# Patient Record
Sex: Female | Born: 1975 | Race: Black or African American | Hispanic: Yes | Marital: Married | Smoking: Former smoker
Health system: Southern US, Community
[De-identification: ages and names within clinical notes are randomized; demographics above are authoritative.]

## PROBLEM LIST (undated history)

## (undated) ENCOUNTER — Emergency Department (HOSPITAL_COMMUNITY): Discharge status: Against Medical Advice | Payer: Self-pay

## (undated) DIAGNOSIS — R269 Unspecified abnormalities of gait and mobility: Secondary | ICD-10-CM

## (undated) DIAGNOSIS — Z86010 Personal history of colonic polyps: Secondary | ICD-10-CM

## (undated) DIAGNOSIS — K760 Fatty (change of) liver, not elsewhere classified: Secondary | ICD-10-CM

## (undated) DIAGNOSIS — G4730 Sleep apnea, unspecified: Secondary | ICD-10-CM

## (undated) DIAGNOSIS — J45909 Unspecified asthma, uncomplicated: Secondary | ICD-10-CM

## (undated) DIAGNOSIS — T7840XA Allergy, unspecified, initial encounter: Secondary | ICD-10-CM

## (undated) DIAGNOSIS — M793 Panniculitis, unspecified: Secondary | ICD-10-CM

## (undated) DIAGNOSIS — R0602 Shortness of breath: Secondary | ICD-10-CM

## (undated) DIAGNOSIS — D649 Anemia, unspecified: Secondary | ICD-10-CM

## (undated) DIAGNOSIS — Z860101 Personal history of adenomatous and serrated colon polyps: Secondary | ICD-10-CM

## (undated) DIAGNOSIS — N939 Abnormal uterine and vaginal bleeding, unspecified: Secondary | ICD-10-CM

## (undated) DIAGNOSIS — E119 Type 2 diabetes mellitus without complications: Secondary | ICD-10-CM

## (undated) DIAGNOSIS — R21 Rash and other nonspecific skin eruption: Secondary | ICD-10-CM

## (undated) DIAGNOSIS — F333 Major depressive disorder, recurrent, severe with psychotic symptoms: Secondary | ICD-10-CM

## (undated) DIAGNOSIS — R0789 Other chest pain: Secondary | ICD-10-CM

## (undated) DIAGNOSIS — K259 Gastric ulcer, unspecified as acute or chronic, without hemorrhage or perforation: Secondary | ICD-10-CM

## (undated) DIAGNOSIS — Z8759 Personal history of other complications of pregnancy, childbirth and the puerperium: Secondary | ICD-10-CM

## (undated) DIAGNOSIS — F4001 Agoraphobia with panic disorder: Secondary | ICD-10-CM

## (undated) DIAGNOSIS — R109 Unspecified abdominal pain: Secondary | ICD-10-CM

## (undated) DIAGNOSIS — J029 Acute pharyngitis, unspecified: Secondary | ICD-10-CM

## (undated) DIAGNOSIS — R1031 Right lower quadrant pain: Secondary | ICD-10-CM

## (undated) DIAGNOSIS — J189 Pneumonia, unspecified organism: Secondary | ICD-10-CM

## (undated) DIAGNOSIS — G43809 Other migraine, not intractable, without status migrainosus: Secondary | ICD-10-CM

## (undated) DIAGNOSIS — F419 Anxiety disorder, unspecified: Secondary | ICD-10-CM

## (undated) DIAGNOSIS — M545 Low back pain, unspecified: Secondary | ICD-10-CM

## (undated) DIAGNOSIS — N764 Abscess of vulva: Secondary | ICD-10-CM

## (undated) DIAGNOSIS — G5793 Unspecified mononeuropathy of bilateral lower limbs: Secondary | ICD-10-CM

## (undated) DIAGNOSIS — N83201 Unspecified ovarian cyst, right side: Secondary | ICD-10-CM

## (undated) DIAGNOSIS — G4733 Obstructive sleep apnea (adult) (pediatric): Secondary | ICD-10-CM

## (undated) DIAGNOSIS — G8929 Other chronic pain: Secondary | ICD-10-CM

## (undated) DIAGNOSIS — Z8674 Personal history of sudden cardiac arrest: Secondary | ICD-10-CM

## (undated) DIAGNOSIS — E559 Vitamin D deficiency, unspecified: Secondary | ICD-10-CM

## (undated) DIAGNOSIS — W19XXXA Unspecified fall, initial encounter: Secondary | ICD-10-CM

## (undated) DIAGNOSIS — Z90710 Acquired absence of both cervix and uterus: Secondary | ICD-10-CM

## (undated) DIAGNOSIS — K219 Gastro-esophageal reflux disease without esophagitis: Secondary | ICD-10-CM

## (undated) DIAGNOSIS — R000 Tachycardia, unspecified: Secondary | ICD-10-CM

## (undated) DIAGNOSIS — F329 Major depressive disorder, single episode, unspecified: Secondary | ICD-10-CM

## (undated) DIAGNOSIS — F411 Generalized anxiety disorder: Secondary | ICD-10-CM

## (undated) DIAGNOSIS — R519 Headache, unspecified: Secondary | ICD-10-CM

## (undated) DIAGNOSIS — F17210 Nicotine dependence, cigarettes, uncomplicated: Secondary | ICD-10-CM

## (undated) DIAGNOSIS — R102 Pelvic and perineal pain: Secondary | ICD-10-CM

## (undated) DIAGNOSIS — R29898 Other symptoms and signs involving the musculoskeletal system: Secondary | ICD-10-CM

## (undated) DIAGNOSIS — Z91018 Allergy to other foods: Secondary | ICD-10-CM

## (undated) DIAGNOSIS — K625 Hemorrhage of anus and rectum: Secondary | ICD-10-CM

## (undated) DIAGNOSIS — K529 Noninfective gastroenteritis and colitis, unspecified: Secondary | ICD-10-CM

## (undated) DIAGNOSIS — F4310 Post-traumatic stress disorder, unspecified: Secondary | ICD-10-CM

## (undated) DIAGNOSIS — M549 Dorsalgia, unspecified: Secondary | ICD-10-CM

## (undated) DIAGNOSIS — M2550 Pain in unspecified joint: Secondary | ICD-10-CM

## (undated) DIAGNOSIS — Z8659 Personal history of other mental and behavioral disorders: Secondary | ICD-10-CM

## (undated) DIAGNOSIS — G709 Myoneural disorder, unspecified: Secondary | ICD-10-CM

## (undated) DIAGNOSIS — K589 Irritable bowel syndrome without diarrhea: Secondary | ICD-10-CM

## (undated) DIAGNOSIS — R079 Chest pain, unspecified: Secondary | ICD-10-CM

## (undated) DIAGNOSIS — R1115 Cyclical vomiting syndrome unrelated to migraine: Secondary | ICD-10-CM

## (undated) DIAGNOSIS — R600 Localized edema: Secondary | ICD-10-CM

## (undated) DIAGNOSIS — E669 Obesity, unspecified: Secondary | ICD-10-CM

## (undated) DIAGNOSIS — D123 Benign neoplasm of transverse colon: Secondary | ICD-10-CM

## (undated) DIAGNOSIS — G4700 Insomnia, unspecified: Secondary | ICD-10-CM

## (undated) DIAGNOSIS — R2681 Unsteadiness on feet: Secondary | ICD-10-CM

## (undated) DIAGNOSIS — K6289 Other specified diseases of anus and rectum: Secondary | ICD-10-CM

## (undated) DIAGNOSIS — M48061 Spinal stenosis, lumbar region without neurogenic claudication: Secondary | ICD-10-CM

## (undated) DIAGNOSIS — F4010 Social phobia, unspecified: Secondary | ICD-10-CM

## (undated) DIAGNOSIS — D125 Benign neoplasm of sigmoid colon: Secondary | ICD-10-CM

## (undated) DIAGNOSIS — R51 Headache: Secondary | ICD-10-CM

## (undated) DIAGNOSIS — J209 Acute bronchitis, unspecified: Secondary | ICD-10-CM

## (undated) DIAGNOSIS — R0600 Dyspnea, unspecified: Secondary | ICD-10-CM

## (undated) DIAGNOSIS — L210 Seborrhea capitis: Secondary | ICD-10-CM

## (undated) DIAGNOSIS — Z789 Other specified health status: Secondary | ICD-10-CM

## (undated) DIAGNOSIS — E785 Hyperlipidemia, unspecified: Secondary | ICD-10-CM

## (undated) HISTORY — PX: OTHER SURGICAL HISTORY: SHX169

## (undated) HISTORY — PX: REFRACTIVE SURGERY: SHX103

## (undated) HISTORY — DX: Other chronic pain: G89.29

## (undated) HISTORY — DX: Right lower quadrant pain: R10.31

## (undated) HISTORY — DX: Benign neoplasm of sigmoid colon: D12.5

## (undated) HISTORY — DX: Allergy to other foods: Z91.018

## (undated) HISTORY — DX: Gastro-esophageal reflux disease without esophagitis: K21.9

## (undated) HISTORY — PX: ABDOMINAL HYSTERECTOMY: SHX81

## (undated) HISTORY — DX: Sleep apnea, unspecified: G47.30

## (undated) HISTORY — DX: Myoneural disorder, unspecified: G70.9

## (undated) HISTORY — DX: Headache, unspecified: R51.9

## (undated) HISTORY — DX: Headache: R51

## (undated) HISTORY — DX: Vitamin D deficiency, unspecified: E55.9

## (undated) HISTORY — DX: Shortness of breath: R06.02

## (undated) HISTORY — DX: Pain in unspecified joint: M25.50

## (undated) HISTORY — PX: ECTOPIC PREGNANCY SURGERY: SHX613

## (undated) HISTORY — PX: UPPER GASTROINTESTINAL ENDOSCOPY: SHX188

## (undated) HISTORY — DX: Gastric ulcer, unspecified as acute or chronic, without hemorrhage or perforation: K25.9

## (undated) HISTORY — DX: Acute bronchitis, unspecified: J20.9

## (undated) HISTORY — DX: Hyperlipidemia, unspecified: E78.5

## (undated) HISTORY — DX: Allergy, unspecified, initial encounter: T78.40XA

## (undated) HISTORY — DX: Benign neoplasm of transverse colon: D12.3

## (undated) HISTORY — DX: Localized edema: R60.0

## (undated) HISTORY — DX: Dorsalgia, unspecified: M54.9

## (undated) HISTORY — DX: Fatty (change of) liver, not elsewhere classified: K76.0

## (undated) HISTORY — PX: COLONOSCOPY: SHX174

---
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
## 2003-02-18 HISTORY — PX: LAPAROSCOPIC CHOLECYSTECTOMY: SUR755

## 2007-02-18 HISTORY — PX: UNILATERAL SALPINGECTOMY: SHX6160

## 2011-10-25 ENCOUNTER — Emergency Department (HOSPITAL_COMMUNITY)
Admission: EM | Admit: 2011-10-25 | Discharge: 2011-10-25 | Disposition: A | Discharge status: Home / Self Care | Payer: Self-pay | Attending: Emergency Medicine | Admitting: Emergency Medicine

## 2011-10-25 ENCOUNTER — Emergency Department (HOSPITAL_COMMUNITY): Payer: Self-pay

## 2011-10-25 ENCOUNTER — Encounter (HOSPITAL_COMMUNITY): Payer: Self-pay | Admitting: *Deleted

## 2011-10-25 DIAGNOSIS — R0602 Shortness of breath: Secondary | ICD-10-CM | POA: Insufficient documentation

## 2011-10-25 DIAGNOSIS — J029 Acute pharyngitis, unspecified: Secondary | ICD-10-CM | POA: Insufficient documentation

## 2011-10-25 DIAGNOSIS — F17200 Nicotine dependence, unspecified, uncomplicated: Secondary | ICD-10-CM | POA: Insufficient documentation

## 2011-10-25 DIAGNOSIS — R1310 Dysphagia, unspecified: Secondary | ICD-10-CM | POA: Insufficient documentation

## 2011-10-25 DIAGNOSIS — Z9089 Acquired absence of other organs: Secondary | ICD-10-CM | POA: Insufficient documentation

## 2011-10-25 DIAGNOSIS — J45909 Unspecified asthma, uncomplicated: Secondary | ICD-10-CM | POA: Insufficient documentation

## 2011-10-25 HISTORY — DX: Unspecified asthma, uncomplicated: J45.909

## 2011-10-25 LAB — CK TOTAL AND CKMB (NOT AT ARMC)
CK, MB: 0.8 ng/mL (ref 0.3–4.0)
Relative Index: INVALID (ref 0.0–2.5)
Total CK: 61 U/L (ref 7–177)

## 2011-10-25 LAB — CBC WITH DIFFERENTIAL/PLATELET
Basophils Absolute: 0 10*3/uL (ref 0.0–0.1)
Basophils Relative: 0 % (ref 0–1)
Eosinophils Absolute: 0 10*3/uL (ref 0.0–0.7)
Eosinophils Relative: 0 % (ref 0–5)
HCT: 33.5 % — ABNORMAL LOW (ref 36.0–46.0)
Hemoglobin: 10.7 g/dL — ABNORMAL LOW (ref 12.0–15.0)
Lymphocytes Relative: 12 % (ref 12–46)
Lymphs Abs: 1.3 10*3/uL (ref 0.7–4.0)
MCH: 25.8 pg — ABNORMAL LOW (ref 26.0–34.0)
MCHC: 31.9 g/dL (ref 30.0–36.0)
MCV: 80.9 fL (ref 78.0–100.0)
Monocytes Absolute: 0.7 10*3/uL (ref 0.1–1.0)
Monocytes Relative: 6 % (ref 3–12)
Neutro Abs: 9.1 10*3/uL — ABNORMAL HIGH (ref 1.7–7.7)
Neutrophils Relative %: 82 % — ABNORMAL HIGH (ref 43–77)
Platelets: 476 10*3/uL — ABNORMAL HIGH (ref 150–400)
RBC: 4.14 MIL/uL (ref 3.87–5.11)
RDW: 18.4 % — ABNORMAL HIGH (ref 11.5–15.5)
WBC: 11.2 10*3/uL — ABNORMAL HIGH (ref 4.0–10.5)

## 2011-10-25 LAB — URINALYSIS, ROUTINE W REFLEX MICROSCOPIC
Bilirubin Urine: NEGATIVE
Glucose, UA: NEGATIVE mg/dL
Hgb urine dipstick: NEGATIVE
Ketones, ur: NEGATIVE mg/dL
Nitrite: NEGATIVE
Protein, ur: NEGATIVE mg/dL
Specific Gravity, Urine: 1.002 — ABNORMAL LOW (ref 1.005–1.030)
Urobilinogen, UA: 0.2 mg/dL (ref 0.0–1.0)
pH: 8 (ref 5.0–8.0)

## 2011-10-25 LAB — URINE MICROSCOPIC-ADD ON

## 2011-10-25 LAB — BASIC METABOLIC PANEL
BUN: 5 mg/dL — ABNORMAL LOW (ref 6–23)
CO2: 20 mEq/L (ref 19–32)
Calcium: 9.2 mg/dL (ref 8.4–10.5)
Chloride: 99 mEq/L (ref 96–112)
Creatinine, Ser: 0.77 mg/dL (ref 0.50–1.10)
GFR calc Af Amer: >90 mL/min (ref >90)
GFR calc non Af Amer: >90 mL/min (ref >90)
Glucose, Bld: 105 mg/dL — ABNORMAL HIGH (ref 70–99)
Potassium: 3.5 mEq/L (ref 3.5–5.1)
Sodium: 136 mEq/L (ref 135–145)

## 2011-10-25 LAB — PREGNANCY, URINE: Preg Test, Ur: NEGATIVE

## 2011-10-25 LAB — D-DIMER, QUANTITATIVE (NOT AT ARMC): D-Dimer, Quant: 0.23 ug/mL-FEU (ref 0.00–0.48)

## 2011-10-25 LAB — RAPID STREP SCREEN (MED CTR MEBANE ONLY): Streptococcus, Group A Screen (Direct): NEGATIVE

## 2011-10-25 MED ORDER — LIDOCAINE VISCOUS 2 % MT SOLN: 15.0000 mL | OROMUCOSAL | Status: AC | PRN

## 2011-10-25 MED ORDER — ONDANSETRON HCL 4 MG/2ML IJ SOLN
4.0000 mg | Freq: Once | INTRAMUSCULAR | Status: AC
  Administered 2011-10-25: 4 mg via INTRAVENOUS
  Filled 2011-10-25: qty 2

## 2011-10-25 MED ORDER — CLINDAMYCIN HCL 150 MG PO CAPS: 300.0000 mg | ORAL_CAPSULE | Freq: Three times a day (TID) | ORAL | Status: AC

## 2011-10-25 MED ORDER — KETOROLAC TROMETHAMINE 30 MG/ML IJ SOLN
30.0000 mg | Freq: Once | INTRAMUSCULAR | Status: AC
  Administered 2011-10-25: 30 mg via INTRAVENOUS
  Filled 2011-10-25: qty 1

## 2011-10-25 MED ORDER — ALBUTEROL SULFATE (5 MG/ML) 0.5% IN NEBU
2.5000 mg | INHALATION_SOLUTION | RESPIRATORY_TRACT | Status: DC
  Administered 2011-10-25: 2.5 mg via RESPIRATORY_TRACT
  Filled 2011-10-25: qty 0.5

## 2011-10-25 MED ORDER — MORPHINE SULFATE 4 MG/ML IJ SOLN
4.0000 mg | Freq: Once | INTRAMUSCULAR | Status: AC
  Administered 2011-10-25: 4 mg via INTRAVENOUS
  Filled 2011-10-25: qty 1

## 2011-10-25 MED ORDER — DEXAMETHASONE SODIUM PHOSPHATE 10 MG/ML IJ SOLN
4.0000 mg | Freq: Once | INTRAMUSCULAR | Status: AC
  Administered 2011-10-25: 4 mg via INTRAVENOUS
  Filled 2011-10-25: qty 1

## 2011-10-25 MED ORDER — METHYLPREDNISOLONE SODIUM SUCC 125 MG IJ SOLR
125.0000 mg | Freq: Once | INTRAMUSCULAR | Status: DC
  Filled 2011-10-25: qty 2

## 2011-10-25 MED ORDER — ALBUTEROL SULFATE (5 MG/ML) 0.5% IN NEBU
INHALATION_SOLUTION | RESPIRATORY_TRACT | Status: AC
  Administered 2011-10-25: 12:00:00
  Filled 2011-10-25: qty 1

## 2011-10-25 MED ORDER — SODIUM CHLORIDE 0.9 % IV BOLUS (SEPSIS)
1000.0000 mL | Freq: Once | INTRAVENOUS | Status: AC
  Administered 2011-10-25: 1000 mL via INTRAVENOUS

## 2011-10-25 MED ORDER — ACETAMINOPHEN 325 MG PO TABS
650.0000 mg | ORAL_TABLET | Freq: Once | ORAL | Status: AC
  Administered 2011-10-25: 650 mg via ORAL
  Filled 2011-10-25: qty 2

## 2011-10-25 MED ORDER — TRAMADOL HCL 50 MG PO TABS: 50.0000 mg | ORAL_TABLET | Freq: Four times a day (QID) | ORAL | Status: AC | PRN

## 2011-10-25 NOTE — ED Notes (Signed)
Pt states she feels she cannot walk to the bus stop to ride the bus home. Pt requesting transport arrangement. Social work notified and will eval case.

## 2011-10-25 NOTE — ED Provider Notes (Signed)
History     CSN: [id number]  Arrival date & time 10/25/11  1158   First MD Initiated Contact with Patient 10/25/11 1215      Chief Complaint  Patient presents with  . Asthma    (Consider location/radiation/quality/duration/timing/severity/associated sxs/prior treatment) HPI  36 y.o. female appears acutely agitated complaining of diffuse myalgia, sore throat, fever, shortness of breath worsening over the course of 48 hours.   Past Medical History  Diagnosis Date  . Asthma     Past Surgical History  Procedure Date  . Cholecystectomy 2005  . Oophorectomy 2009    left    Family History  Problem Relation Age of Onset  . Hypertension Mother   . Diabetes Mother   . Cancer Father   . Hyperlipidemia Father   . Hypertension Father     History  Substance Use Topics  . Smoking status: Current Some Day Smoker -- 5 years    Types: Cigarettes  . Smokeless tobacco: Not on file  . Alcohol Use: 0.6 oz/week    1 Cans of beer per week     one beer daily    OB History    Grav Para Term Preterm Abortions TAB SAB Ect Mult Living   3 1   1  1          Review of Systems  Constitutional: Positive for fever.  HENT: Positive for sore throat.   Respiratory: Positive for shortness of breath. Negative for cough.   Cardiovascular: Negative for chest pain.  Gastrointestinal: Negative for nausea, vomiting, abdominal pain and diarrhea.  Musculoskeletal: Positive for myalgias.  All other systems reviewed and are negative.    Allergies  Penicillins and Shellfish allergy  Home Medications   Current Outpatient Rx  Name Route Sig Dispense Refill  . IBUPROFEN 200 MG PO TABS Oral Take 400 mg by mouth every 6 (six) hours as needed. For pain      BP 137/87  Pulse 115  Temp 98.1 F (36.7 C)  Resp 22  SpO2 100%  LMP 09/30/2011  Physical Exam  Nursing note and vitals reviewed. Constitutional: She is oriented to person, place, and time. She appears well-developed and  well-nourished. She appears distressed.  HENT:  Head: Normocephalic and atraumatic.  Right Ear: External ear normal.  Left Ear: External ear normal.  Nose: Nose normal.  Mouth/Throat: Oropharynx is clear and moist. No oropharyngeal exudate.       Mild 1+ tonsillar hypertrophy no exudate. Patient is controlling her own secretions without issue.  Eyes: Conjunctivae and EOM are normal. Pupils are equal, round, and reactive to light.  Neck: Normal range of motion.        Exquisitely tender anterior cervical lymphadenopathy.  Cardiovascular: Normal rate, regular rhythm and normal heart sounds.  Exam reveals no friction rub.   No murmur heard. Pulmonary/Chest: Effort normal and breath sounds normal. No stridor. No respiratory distress. She has no wheezes. She has no rales. She exhibits no tenderness.       No tachypnea, accessory muscle use, patient is speaking in full sentences and lying supine comfortably.  Abdominal: Soft. Bowel sounds are normal. She exhibits no distension and no mass. There is no tenderness. There is no rebound and no guarding.  Musculoskeletal: Normal range of motion.  Lymphadenopathy:    She has cervical adenopathy.  Neurological: She is alert and oriented to person, place, and time.  Skin: Skin is warm. She is not diaphoretic.  Psychiatric: She has a normal mood and  affect.    ED Course  Procedures (including critical care time)  Labs Reviewed  CBC WITH DIFFERENTIAL - Abnormal; Notable for the following:    WBC 11.2 (*)     Hemoglobin 10.7 (*)     HCT 33.5 (*)     MCH 25.8 (*)     RDW 18.4 (*)     Platelets 476 (*)     Neutrophils Relative 82 (*)     Neutro Abs 9.1 (*)     All other components within normal limits  BASIC METABOLIC PANEL - Abnormal; Notable for the following:    Glucose, Bld 105 (*)     BUN 5 (*)     All other components within normal limits  URINALYSIS, ROUTINE W REFLEX MICROSCOPIC - Abnormal; Notable for the following:    APPearance  CLOUDY (*)     Specific Gravity, Urine 1.002 (*)     Leukocytes, UA TRACE (*)     All other components within normal limits  URINE MICROSCOPIC-ADD ON - Abnormal; Notable for the following:    Squamous Epithelial / LPF FEW (*)     Bacteria, UA FEW (*)     All other components within normal limits  RAPID STREP SCREEN  D-DIMER, QUANTITATIVE  CK TOTAL AND CKMB  PREGNANCY, URINE  LAB REPORT - SCANNED   Dg Neck Soft Tissue  10/25/2011  *RADIOLOGY REPORT*  Clinical Data: Asthma. Difficulty swallowing.  NECK SOFT TISSUES - 1+ VIEW  Comparison: None.  Findings: The epiglottis is normal.  The aryepiglottic folds are normal.  The piriform sinus and vallecular air spaces are normal. No abnormal prevertebral or retropharyngeal soft tissue swelling or abnormal air collections.  The lung apices are clear.  Cervical ribs are noted.  IMPRESSION: Normal soft tissue views of the neck.   Original Report Authenticated By: P. Loralie Champagne, M.D.    Dg Chest 2 View  10/25/2011  *RADIOLOGY REPORT*  Clinical Data: Cough and shortness of breath.  CHEST - 2 VIEW  Comparison: None  Findings: The cardiac silhouette, mediastinal and hilar contours are normal.  Low lung volumes with mild vascular crowding and streaky basilar atelectasis.  There is peribronchial thickening and increased interstitial markings suggesting bronchitis.  No focal infiltrates or effusions.  The bony thorax is intact.  IMPRESSION: Findings suggest bronchitis.  No definite infiltrates.   Original Report Authenticated By: P. Loralie Champagne, M.D.      1. Pharyngitis       MDM  No signs of peritonsillar abscess. Patient received 4 mg of Decadron, with morphine and significant subjective improvement resulted. Patient's d-dimer is negative, chest x-ray and lateral soft tissue neck supple neck x-rays show no abnormalities. I will treat her for strep based on [facility] Criteria.   New Prescriptions   CLINDAMYCIN (CLEOCIN) 150 MG CAPSULE    Take 2  capsules (300 mg total) by mouth 3 (three) times daily.   LIDOCAINE (XYLOCAINE) 2 % SOLUTION    Take 15 mLs by mouth every 3 (three) hours as needed for pain.   TRAMADOL (ULTRAM) 50 MG TABLET    Take 1 tablet (50 mg total) by mouth every 6 (six) hours as needed for pain.          Wynetta Emery, PA-C 10/26/11 1008

## 2011-10-25 NOTE — ED Notes (Signed)
Pt wants to eat and drink. Cleared with ED MD.

## 2011-10-25 NOTE — ED Notes (Signed)
EDMD notified of pts recent temp. Orders given.

## 2011-10-25 NOTE — ED Notes (Signed)
MD at bedside. 

## 2011-10-25 NOTE — ED Notes (Signed)
CSW met with pt per request of RN. Pt informed CSW that she has no means of transportation upon discharge to return back to her residence in [place]. Pt's RN reported concerns of ambulation and inability to utilize public transit due to this issue. Pt has contacted other individuals in order to receive transportation assistance but has been unsuccessful. CSW informed pt that a taxi voucher will be provided for this occurrence to ensure pt has a safe discharge back to her residence. Pt thanked CSW and verbalized no other concerns. CSW signing off.  Janann Colonel., MSW, [facility] Clinical Social Worker [phone]

## 2011-10-25 NOTE — ED Notes (Addendum)
Per EMS: Pt c/o dyspnea, n/v/d with throat pain for two days. Unable to afford asthma meds for last few months due to financial issues.

## 2011-10-26 NOTE — ED Provider Notes (Signed)
Medical screening examination/treatment/procedure(s) were performed by non-physician practitioner and as supervising physician I was immediately available for consultation/collaboration.   Richardean Canal, MD 10/26/11 [phone]

## 2011-11-05 ENCOUNTER — Emergency Department (HOSPITAL_COMMUNITY): Payer: Self-pay

## 2011-11-05 ENCOUNTER — Emergency Department (HOSPITAL_COMMUNITY)
Admission: EM | Admit: 2011-11-05 | Discharge: 2011-11-05 | Disposition: A | Discharge status: Home / Self Care | Payer: Self-pay | Attending: Emergency Medicine | Admitting: Emergency Medicine

## 2011-11-05 DIAGNOSIS — Z9089 Acquired absence of other organs: Secondary | ICD-10-CM | POA: Insufficient documentation

## 2011-11-05 DIAGNOSIS — R109 Unspecified abdominal pain: Secondary | ICD-10-CM | POA: Insufficient documentation

## 2011-11-05 DIAGNOSIS — F411 Generalized anxiety disorder: Secondary | ICD-10-CM | POA: Insufficient documentation

## 2011-11-05 DIAGNOSIS — F419 Anxiety disorder, unspecified: Secondary | ICD-10-CM

## 2011-11-05 DIAGNOSIS — R079 Chest pain, unspecified: Secondary | ICD-10-CM | POA: Insufficient documentation

## 2011-11-05 DIAGNOSIS — J45909 Unspecified asthma, uncomplicated: Secondary | ICD-10-CM | POA: Insufficient documentation

## 2011-11-05 DIAGNOSIS — F17200 Nicotine dependence, unspecified, uncomplicated: Secondary | ICD-10-CM | POA: Insufficient documentation

## 2011-11-05 LAB — BASIC METABOLIC PANEL
BUN: 6 mg/dL (ref 6–23)
CO2: 21 mEq/L (ref 19–32)
Calcium: 9.3 mg/dL (ref 8.4–10.5)
Chloride: 101 mEq/L (ref 96–112)
Creatinine, Ser: 0.54 mg/dL (ref 0.50–1.10)
GFR calc Af Amer: >90 mL/min (ref >90)
GFR calc non Af Amer: >90 mL/min (ref >90)
Glucose, Bld: 95 mg/dL (ref 70–99)
Potassium: 4.2 mEq/L (ref 3.5–5.1)
Sodium: 137 mEq/L (ref 135–145)

## 2011-11-05 LAB — URINALYSIS, ROUTINE W REFLEX MICROSCOPIC
Bilirubin Urine: NEGATIVE
Glucose, UA: NEGATIVE mg/dL
Ketones, ur: NEGATIVE mg/dL
Leukocytes, UA: NEGATIVE
Nitrite: NEGATIVE
Protein, ur: 30 mg/dL — AB
Specific Gravity, Urine: 1.013 (ref 1.005–1.030)
Urobilinogen, UA: 0.2 mg/dL (ref 0.0–1.0)
pH: 7.5 (ref 5.0–8.0)

## 2011-11-05 LAB — CBC
HCT: 31.5 % — ABNORMAL LOW (ref 36.0–46.0)
Hemoglobin: 10.1 g/dL — ABNORMAL LOW (ref 12.0–15.0)
MCH: 25.2 pg — ABNORMAL LOW (ref 26.0–34.0)
MCHC: 32.1 g/dL (ref 30.0–36.0)
MCV: 78.6 fL (ref 78.0–100.0)
Platelets: 476 10*3/uL — ABNORMAL HIGH (ref 150–400)
RBC: 4.01 MIL/uL (ref 3.87–5.11)
RDW: 17.8 % — ABNORMAL HIGH (ref 11.5–15.5)
WBC: 9.1 10*3/uL (ref 4.0–10.5)

## 2011-11-05 LAB — URINE MICROSCOPIC-ADD ON

## 2011-11-05 LAB — POCT I-STAT TROPONIN I: Troponin i, poc: 0 ng/mL (ref 0.00–0.08)

## 2011-11-05 LAB — POCT PREGNANCY, URINE: Preg Test, Ur: NEGATIVE

## 2011-11-05 LAB — LIPASE, BLOOD: Lipase: 51 U/L (ref 11–59)

## 2011-11-05 LAB — GLUCOSE, CAPILLARY: Glucose-Capillary: 100 mg/dL — ABNORMAL HIGH (ref 70–99)

## 2011-11-05 MED ORDER — LORAZEPAM 2 MG/ML IJ SOLN
INTRAMUSCULAR | Status: AC
  Filled 2011-11-05: qty 1

## 2011-11-05 MED ORDER — ONDANSETRON HCL 4 MG/2ML IJ SOLN
4.0000 mg | Freq: Once | INTRAMUSCULAR | Status: AC
  Administered 2011-11-05: 4 mg via INTRAVENOUS

## 2011-11-05 MED ORDER — LORAZEPAM 2 MG/ML IJ SOLN
1.0000 mg | Freq: Once | INTRAMUSCULAR | Status: AC
  Administered 2011-11-05: 1 mg via INTRAVENOUS

## 2011-11-05 MED ORDER — SODIUM CHLORIDE 0.9 % IV SOLN: INTRAVENOUS | Status: DC

## 2011-11-05 MED ORDER — ONDANSETRON HCL 4 MG/2ML IJ SOLN
INTRAMUSCULAR | Status: AC
  Filled 2011-11-05: qty 2

## 2011-11-05 MED ORDER — SODIUM CHLORIDE 0.9 % IV BOLUS (SEPSIS)
1000.0000 mL | Freq: Once | INTRAVENOUS | Status: AC
  Administered 2011-11-05: 1000 mL via INTRAVENOUS

## 2011-11-05 MED ORDER — ALPRAZOLAM 0.25 MG PO TABS: 0.2500 mg | ORAL_TABLET | Freq: Three times a day (TID) | ORAL | Status: DC | PRN

## 2011-11-05 MED ORDER — ONDANSETRON 8 MG PO TBDP: 8.0000 mg | ORAL_TABLET | Freq: Three times a day (TID) | ORAL | Status: DC | PRN

## 2011-11-05 NOTE — ED Notes (Signed)
Pt alert and oriented x4. Respirations even and unlabored, bilateral symmetrical rise and fall of chest. Skin warm and dry. In no acute distress. Denies needs.   

## 2011-11-05 NOTE — ED Provider Notes (Signed)
History     CSN: [id number]  Arrival date & time 11/05/11  1507   First MD Initiated Contact with Patient 11/05/11 1543      Chief Complaint  Patient presents with  . Abdominal Pain  . Chest Pain    (Consider location/radiation/quality/duration/timing/severity/associated sxs/prior treatment) Patient is a 36 y.o. female presenting with abdominal pain and chest pain. The history is provided by the patient.  Abdominal Pain The primary symptoms of the illness include abdominal pain.  Chest Pain Primary symptoms include abdominal pain.    issue here with vomiting and diarrhea associated lower tunnel pain. Symptoms began today acutely and had lost associated with increased anxiety. Pain is cramping starts in her suprapubic region and radiates up to her chest. She denies any anginal type chest pain at this time. Does increase stress at home. Denies any suicidal or homicidal ideations. No medications taken for this prior to arrival. [facility] and was transported here. She denies any prior history of cardiac disease. States that she is on her menstrual cycle currently at this time. No excessive bleeding noted. Denies any urinary symptoms  Past Medical History  Diagnosis Date  . Asthma     Past Surgical History  Procedure Date  . Cholecystectomy 2005  . Oophorectomy 2009    left    Family History  Problem Relation Age of Onset  . Hypertension Mother   . Diabetes Mother   . Cancer Father   . Hyperlipidemia Father   . Hypertension Father     History  Substance Use Topics  . Smoking status: Current Some Day Smoker -- 5 years    Types: Cigarettes  . Smokeless tobacco: Not on file  . Alcohol Use: 0.6 oz/week    1 Cans of beer per week     one beer daily    OB History    Grav Para Term Preterm Abortions TAB SAB Ect Mult Living   3 1   1  1          Review of Systems  Cardiovascular: Positive for chest pain.  Gastrointestinal: Positive for abdominal pain.  All other  systems reviewed and are negative.    Allergies  Penicillins and Shellfish allergy  Home Medications   Current Outpatient Rx  Name Route Sig Dispense Refill  . CLINDAMYCIN HCL 150 MG PO CAPS Oral Take 2 capsules (300 mg total) by mouth 3 (three) times daily. 60 capsule 0  . IBUPROFEN 200 MG PO TABS Oral Take 400 mg by mouth every 6 (six) hours as needed. For pain    . LIDOCAINE VISCOUS 2 % MT SOLN Oral Take 15 mLs by mouth every 3 (three) hours as needed for pain. 100 mL 0  . TRAMADOL HCL 50 MG PO TABS Oral Take 1 tablet (50 mg total) by mouth every 6 (six) hours as needed for pain. 10 tablet 0    BP 123/79  Temp 98.1 F (36.7 C) (Oral)  Resp 30  SpO2 100%  LMP 09/30/2011  Physical Exam  Nursing note and vitals reviewed. Constitutional: She is oriented to person, place, and time. She appears well-developed and well-nourished.  Non-toxic appearance. No distress.  HENT:  Head: Normocephalic and atraumatic.  Eyes: Conjunctivae normal, EOM and lids are normal. Pupils are equal, round, and reactive to light.  Neck: Normal range of motion. Neck supple. No tracheal deviation present. No mass present.  Cardiovascular: Regular rhythm and normal heart sounds.  Tachycardia present.  Exam reveals no gallop.  No murmur heard. Pulmonary/Chest: Effort normal and breath sounds normal. No stridor. No respiratory distress. She has no decreased breath sounds. She has no wheezes. She has no rhonchi. She has no rales.  Abdominal: Soft. Normal appearance and bowel sounds are normal. She exhibits no distension. There is tenderness in the suprapubic area. There is no rigidity, no rebound, no guarding and no CVA tenderness.  Musculoskeletal: Normal range of motion. She exhibits no edema and no tenderness.  Neurological: She is alert and oriented to person, place, and time. She has normal strength. No cranial nerve deficit or sensory deficit. GCS eye subscore is 4. GCS verbal subscore is 5. GCS motor  subscore is 6.  Skin: Skin is warm and dry. No abrasion and no rash noted.  Psychiatric: Her mood appears anxious. Her speech is rapid and/or pressured. She is is hyperactive.    ED Course  Procedures (including critical care time)  Labs Reviewed  GLUCOSE, CAPILLARY - Abnormal; Notable for the following:    Glucose-Capillary 100 (*)     All other components within normal limits  CBC  BASIC METABOLIC PANEL  URINALYSIS, ROUTINE W REFLEX MICROSCOPIC  LIPASE, BLOOD   No results found.   No diagnosis found.    MDM   Date: 11/05/2011  Rate: 100  Rhythm: sinus tachycardia  QRS Axis: normal  Intervals: normal  ST/T Wave abnormalities: normal  Conduction Disutrbances:none  Narrative Interpretation:   Old EKG Reviewed: none available    5:26 PM Patient given Ativan and feels better. Suspect that her symptoms are secondary to her anxiety. Repeat abdominal exam at time of discharge is stable      Toy Baker, MD 11/05/11 1726

## 2011-11-05 NOTE — ED Notes (Signed)
Per ems pt is from home. C/o of lower abdominal pain today, slowly has radiated into chest. Pt very shaky, chest pain "feels like a squeezing". Abdominal pain "feels like a pulling". Pt reports she is on her menstural cycle. 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   Pt has been hyperventilating.

## 2011-11-05 NOTE — ED Notes (Signed)
Pt escorted to the d/c window. Given drink. In no acute distress

## 2012-03-08 ENCOUNTER — Encounter (HOSPITAL_COMMUNITY): Payer: Self-pay | Admitting: Emergency Medicine

## 2012-03-08 ENCOUNTER — Emergency Department (HOSPITAL_COMMUNITY): Payer: Self-pay

## 2012-03-08 ENCOUNTER — Emergency Department (HOSPITAL_COMMUNITY)
Admission: EM | Admit: 2012-03-08 | Discharge: 2012-03-08 | Disposition: A | Discharge status: Home / Self Care | Payer: Self-pay | Attending: Emergency Medicine | Admitting: Emergency Medicine

## 2012-03-08 DIAGNOSIS — Z3202 Encounter for pregnancy test, result negative: Secondary | ICD-10-CM | POA: Insufficient documentation

## 2012-03-08 DIAGNOSIS — R110 Nausea: Secondary | ICD-10-CM | POA: Insufficient documentation

## 2012-03-08 DIAGNOSIS — N949 Unspecified condition associated with female genital organs and menstrual cycle: Secondary | ICD-10-CM | POA: Insufficient documentation

## 2012-03-08 DIAGNOSIS — Z9079 Acquired absence of other genital organ(s): Secondary | ICD-10-CM | POA: Insufficient documentation

## 2012-03-08 DIAGNOSIS — N9489 Other specified conditions associated with female genital organs and menstrual cycle: Secondary | ICD-10-CM | POA: Insufficient documentation

## 2012-03-08 DIAGNOSIS — R109 Unspecified abdominal pain: Secondary | ICD-10-CM | POA: Insufficient documentation

## 2012-03-08 DIAGNOSIS — F17200 Nicotine dependence, unspecified, uncomplicated: Secondary | ICD-10-CM | POA: Insufficient documentation

## 2012-03-08 DIAGNOSIS — N938 Other specified abnormal uterine and vaginal bleeding: Secondary | ICD-10-CM | POA: Insufficient documentation

## 2012-03-08 DIAGNOSIS — N858 Other specified noninflammatory disorders of uterus: Secondary | ICD-10-CM

## 2012-03-08 DIAGNOSIS — R197 Diarrhea, unspecified: Secondary | ICD-10-CM | POA: Insufficient documentation

## 2012-03-08 DIAGNOSIS — N898 Other specified noninflammatory disorders of vagina: Secondary | ICD-10-CM | POA: Insufficient documentation

## 2012-03-08 DIAGNOSIS — J45909 Unspecified asthma, uncomplicated: Secondary | ICD-10-CM | POA: Insufficient documentation

## 2012-03-08 LAB — LIPASE, BLOOD: Lipase: 27 U/L (ref 11–59)

## 2012-03-08 LAB — COMPREHENSIVE METABOLIC PANEL
ALT: 9 U/L (ref 0–35)
AST: 13 U/L (ref 0–37)
Albumin: 3.3 g/dL — ABNORMAL LOW (ref 3.5–5.2)
Alkaline Phosphatase: 82 U/L (ref 39–117)
BUN: 10 mg/dL (ref 6–23)
CO2: 21 mEq/L (ref 19–32)
Calcium: 9 mg/dL (ref 8.4–10.5)
Chloride: 102 mEq/L (ref 96–112)
Creatinine, Ser: 0.76 mg/dL (ref 0.50–1.10)
GFR calc Af Amer: >90 mL/min (ref >90)
GFR calc non Af Amer: >90 mL/min (ref >90)
Glucose, Bld: 94 mg/dL (ref 70–99)
Potassium: 4.3 mEq/L (ref 3.5–5.1)
Sodium: 136 mEq/L (ref 135–145)
Total Bilirubin: 0.1 mg/dL — ABNORMAL LOW (ref 0.3–1.2)
Total Protein: 7.6 g/dL (ref 6.0–8.3)

## 2012-03-08 LAB — CBC WITH DIFFERENTIAL/PLATELET
Basophils Absolute: 0 10*3/uL (ref 0.0–0.1)
Basophils Relative: 0 % (ref 0–1)
Eosinophils Absolute: 0.1 10*3/uL (ref 0.0–0.7)
Eosinophils Relative: 2 % (ref 0–5)
HCT: 31.6 % — ABNORMAL LOW (ref 36.0–46.0)
Hemoglobin: 9.9 g/dL — ABNORMAL LOW (ref 12.0–15.0)
Lymphocytes Relative: 33 % (ref 12–46)
Lymphs Abs: 2 10*3/uL (ref 0.7–4.0)
MCH: 24.8 pg — ABNORMAL LOW (ref 26.0–34.0)
MCHC: 31.3 g/dL (ref 30.0–36.0)
MCV: 79.2 fL (ref 78.0–100.0)
Monocytes Absolute: 0.6 10*3/uL (ref 0.1–1.0)
Monocytes Relative: 9 % (ref 3–12)
Neutro Abs: 3.5 10*3/uL (ref 1.7–7.7)
Neutrophils Relative %: 56 % (ref 43–77)
Platelets: 572 10*3/uL — ABNORMAL HIGH (ref 150–400)
RBC: 3.99 MIL/uL (ref 3.87–5.11)
RDW: 18.8 % — ABNORMAL HIGH (ref 11.5–15.5)
WBC: 6.2 10*3/uL (ref 4.0–10.5)

## 2012-03-08 LAB — URINALYSIS, ROUTINE W REFLEX MICROSCOPIC
Bilirubin Urine: NEGATIVE
Glucose, UA: NEGATIVE mg/dL
Ketones, ur: NEGATIVE mg/dL
Leukocytes, UA: NEGATIVE
Nitrite: NEGATIVE
Protein, ur: 30 mg/dL — AB
Specific Gravity, Urine: 1.021 (ref 1.005–1.030)
Urobilinogen, UA: 0.2 mg/dL (ref 0.0–1.0)
pH: 8 (ref 5.0–8.0)

## 2012-03-08 LAB — RPR: RPR Ser Ql: NONREACTIVE

## 2012-03-08 LAB — URINE MICROSCOPIC-ADD ON

## 2012-03-08 LAB — POCT PREGNANCY, URINE: Preg Test, Ur: NEGATIVE

## 2012-03-08 MED ORDER — SODIUM CHLORIDE 0.9 % IV SOLN
1000.0000 mL | INTRAVENOUS | Status: DC
  Administered 2012-03-08 (×2): 1000 mL via INTRAVENOUS

## 2012-03-08 MED ORDER — SODIUM CHLORIDE 0.9 % IV SOLN
1000.0000 mL | Freq: Once | INTRAVENOUS | Status: AC
  Administered 2012-03-08: 1000 mL via INTRAVENOUS

## 2012-03-08 MED ORDER — ONDANSETRON HCL 4 MG/2ML IJ SOLN
4.0000 mg | Freq: Once | INTRAMUSCULAR | Status: AC
  Administered 2012-03-08: 4 mg via INTRAVENOUS
  Filled 2012-03-08: qty 2

## 2012-03-08 MED ORDER — HYDROMORPHONE HCL PF 1 MG/ML IJ SOLN
INTRAMUSCULAR | Status: AC
  Administered 2012-03-08: 1 mg
  Filled 2012-03-08: qty 1

## 2012-03-08 MED ORDER — FENTANYL CITRATE 0.05 MG/ML IJ SOLN: 50.0000 ug | Freq: Once | INTRAMUSCULAR | Status: DC

## 2012-03-08 MED ORDER — HYDROCODONE-ACETAMINOPHEN 5-325 MG PO TABS
1.0000 | ORAL_TABLET | Freq: Four times a day (QID) | ORAL | Status: DC | PRN
Start: 2012-03-08 — End: 2012-05-18

## 2012-03-08 MED ORDER — HYDROMORPHONE HCL PF 1 MG/ML IJ SOLN
1.0000 mg | INTRAMUSCULAR | Status: DC | PRN
  Administered 2012-03-08 (×2): 1 mg via INTRAVENOUS
  Filled 2012-03-08 (×2): qty 1

## 2012-03-08 NOTE — ED Notes (Signed)
[id number]> Expected date:<BR> Expected time:<BR> Means of arrival:<BR> Comments:<BR> Ems/ abd pain

## 2012-03-08 NOTE — ED Provider Notes (Signed)
History    CSN: [id number] Arrival date & time 03/08/12  7829 First MD Initiated Contact with Patient 03/08/12 [phone]     Chief Complaint  Patient presents with  . Abdominal Pain   Patient is a 37 y.o. female presenting with abdominal pain. The history is provided by the patient.  Abdominal Pain The primary symptoms of the illness include abdominal pain and vaginal bleeding (spotting). The current episode started 3 to 5 hours ago. The onset of the illness was sudden. The problem has been gradually worsening.  The pain came on suddenly (sharp and stabbing, tearing). The abdominal pain is located in the suprapubic region. The abdominal pain does not radiate. The severity of the abdominal pain is 10/10.  Symptoms associated with the illness do not include constipation, urgency, hematuria or frequency. Associated symptoms comments: Diarrhea .   History of ectopic in the past with left oopherectomy.  Past Medical History  Diagnosis Date  . Asthma   . EP (ectopic pregnancy)     Past Surgical History  Procedure Date  . Cholecystectomy 2005  . Oophorectomy 2009    left    Family History  Problem Relation Age of Onset  . Hypertension Mother   . Diabetes Mother   . Cancer Father   . Hyperlipidemia Father   . Hypertension Father     History  Substance Use Topics  . Smoking status: Current Some Day Smoker -- 5 years    Types: Cigarettes  . Smokeless tobacco: Not on file  . Alcohol Use: 0.6 oz/week    1 Cans of beer per week     Comment: one beer daily    OB History    Grav Para Term Preterm Abortions TAB SAB Ect Mult Living   3 1   1  1          Review of Systems  Gastrointestinal: Positive for abdominal pain. Negative for constipation.  Genitourinary: Positive for vaginal bleeding (spotting). Negative for urgency, frequency and hematuria.  All other systems reviewed and are negative.    Allergies  Penicillins and Shellfish allergy  Home Medications   Current  Outpatient Rx  Name  Route  Sig  Dispense  Refill  . ONDANSETRON 8 MG PO TBDP   Oral   Take 1 tablet (8 mg total) by mouth every 8 (eight) hours as needed for nausea.   20 tablet   0     BP 135/89  Pulse 100  Temp 98.7 F (37.1 C) (Oral)  Resp 22  SpO2 100%  Physical Exam  Nursing note and vitals reviewed. Constitutional: She appears distressed.       Obese  HENT:  Head: Normocephalic and atraumatic.  Right Ear: External ear normal.  Left Ear: External ear normal.  Mouth/Throat: No oropharyngeal exudate.  Eyes: Conjunctivae normal are normal. Right eye exhibits no discharge. Left eye exhibits no discharge. No scleral icterus.  Neck: Neck supple. No tracheal deviation present.  Cardiovascular: Normal rate, regular rhythm and intact distal pulses.   Pulmonary/Chest: Effort normal and breath sounds normal. No stridor. No respiratory distress. She has no wheezes. She has no rales.  Abdominal: Soft. Bowel sounds are normal. She exhibits no distension. There is tenderness in the suprapubic area. There is guarding. There is no rebound. No hernia. Hernia confirmed negative in the right inguinal area and confirmed negative in the left inguinal area.  Genitourinary: There is no rash on the right labia. There is no rash on the left labia.  Uterus is tender. Cervix exhibits no motion tenderness. Right adnexum displays tenderness. Right adnexum displays no mass. Left adnexum displays no mass. There is bleeding around the vagina. No erythema or tenderness around the vagina. No signs of injury around the vagina.       Dark blood in vaginal vault, no bright red blood, no clots  Musculoskeletal: She exhibits no edema and no tenderness.  Neurological: She is alert. She has normal strength. No sensory deficit. Cranial nerve deficit:  no gross defecits noted. She exhibits normal muscle tone. She displays no seizure activity. Coordination normal.  Skin: Skin is warm and dry. No rash noted.    Psychiatric: She has a normal mood and affect.    ED Course  Procedures (including critical care time)  Labs Reviewed  URINALYSIS, ROUTINE W REFLEX MICROSCOPIC - Abnormal; Notable for the following:    APPearance TURBID (*)     Hgb urine dipstick LARGE (*)     Protein, ur 30 (*)     All other components within normal limits  COMPREHENSIVE METABOLIC PANEL - Abnormal; Notable for the following:    Albumin 3.3 (*)     Total Bilirubin 0.1 (*)     All other components within normal limits  CBC WITH DIFFERENTIAL - Abnormal; Notable for the following:    Hemoglobin 9.9 (*)     HCT 31.6 (*)     MCH 24.8 (*)     RDW 18.8 (*)     Platelets 572 (*)     All other components within normal limits  URINE MICROSCOPIC-ADD ON - Abnormal; Notable for the following:    Bacteria, UA FEW (*)     All other components within normal limits  LIPASE, BLOOD  POCT PREGNANCY, URINE  GC/CHLAMYDIA PROBE AMP  RPR   US Transvaginal Non-ob  03/08/2012  *RADIOLOGY REPORT*  Clinical Data:  Severe suprapubic pain.  Vaginal bleeding. Negative urine pregnancy test.  Evaluate for ovarian torsion.  TRANSABDOMINAL AND TRANSVAGINAL ULTRASOUND OF PELVIS DOPPLER ULTRASOUND OF OVARIES  Technique:  Both transabdominal and transvaginal ultrasound examinations of the pelvis were performed. Transabdominal technique was performed for global imaging of the pelvis including uterus, ovaries, adnexal regions, and pelvic cul-de-sac.  It was necessary to proceed with endovaginal exam following the transabdominal exam to visualize the endometrium and adnexal regions.  Color and duplex Doppler ultrasound was utilized to evaluate blood flow to the ovaries.  Comparison:  None  Findings:  Uterus:  9.9 x 6.2 x 7.5 cm.  There is a possible posterior central uterine fibroid measuring 3.6 x 3.5 cm.  Alternatively, findings could be related to thickened endometrium.  Endometrium:  Difficult to evaluate.  It is possible that the endometrium is 5 mm  in thickness and is anteriorly displaced by a central fibroid.  Alternatively, the endometrium may be thickened, possibly measuring up to 4.4 cm.  Right ovary: 3.3 x 2.3 x 1.7 cm.  Left ovary:    Not visualized, likely because of intervening bowel loops.  Pulsed Doppler evaluation demonstrates normal low resistance arterial and venous waveforms in the right ovary.  The left ovary is not evaluated.  IMPRESSION:  1.  The central portion of the uterus is difficult to evaluate sonographically, but appears abnormal.  There is possible central uterine fibroid or mass versus a thickened endometrium. Recommend further evaluation with MRI, hysteroscopy, or endometrial biopsy. 2.  Normal appearance of the right ovary without evidence for torsion. 3.  Non-visualized left ovary.  No sonographic  evidence for ovarian torsion.   Original Report Authenticated By: Norva Pavlov, M.D.    US Pelvis Complete  03/08/2012  *RADIOLOGY REPORT*  Clinical Data:  Severe suprapubic pain.  Vaginal bleeding. Negative urine pregnancy test.  Evaluate for ovarian torsion.  TRANSABDOMINAL AND TRANSVAGINAL ULTRASOUND OF PELVIS DOPPLER ULTRASOUND OF OVARIES  Technique:  Both transabdominal and transvaginal ultrasound examinations of the pelvis were performed. Transabdominal technique was performed for global imaging of the pelvis including uterus, ovaries, adnexal regions, and pelvic cul-de-sac.  It was necessary to proceed with endovaginal exam following the transabdominal exam to visualize the endometrium and adnexal regions.  Color and duplex Doppler ultrasound was utilized to evaluate blood flow to the ovaries.  Comparison:  None  Findings:  Uterus:  9.9 x 6.2 x 7.5 cm.  There is a possible posterior central uterine fibroid measuring 3.6 x 3.5 cm.  Alternatively, findings could be related to thickened endometrium.  Endometrium:  Difficult to evaluate.  It is possible that the endometrium is 5 mm in thickness and is anteriorly displaced by a  central fibroid.  Alternatively, the endometrium may be thickened, possibly measuring up to 4.4 cm.  Right ovary: 3.3 x 2.3 x 1.7 cm.  Left ovary:    Not visualized, likely because of intervening bowel loops.  Pulsed Doppler evaluation demonstrates normal low resistance arterial and venous waveforms in the right ovary.  The left ovary is not evaluated.  IMPRESSION:  1.  The central portion of the uterus is difficult to evaluate sonographically, but appears abnormal.  There is possible central uterine fibroid or mass versus a thickened endometrium. Recommend further evaluation with MRI, hysteroscopy, or endometrial biopsy. 2.  Normal appearance of the right ovary without evidence for torsion. 3.  Non-visualized left ovary.  No sonographic evidence for ovarian torsion.   Original Report Authenticated By: Norva Pavlov, M.D.    [name] Art/ven Flow Abd Pelv Doppler  03/08/2012  *RADIOLOGY REPORT*  Clinical Data:  Severe suprapubic pain.  Vaginal bleeding. Negative urine pregnancy test.  Evaluate for ovarian torsion.  TRANSABDOMINAL AND TRANSVAGINAL ULTRASOUND OF PELVIS DOPPLER ULTRASOUND OF OVARIES  Technique:  Both transabdominal and transvaginal ultrasound examinations of the pelvis were performed. Transabdominal technique was performed for global imaging of the pelvis including uterus, ovaries, adnexal regions, and pelvic cul-de-sac.  It was necessary to proceed with endovaginal exam following the transabdominal exam to visualize the endometrium and adnexal regions.  Color and duplex Doppler ultrasound was utilized to evaluate blood flow to the ovaries.  Comparison:  None  Findings:  Uterus:  9.9 x 6.2 x 7.5 cm.  There is a possible posterior central uterine fibroid measuring 3.6 x 3.5 cm.  Alternatively, findings could be related to thickened endometrium.  Endometrium:  Difficult to evaluate.  It is possible that the endometrium is 5 mm in thickness and is anteriorly displaced by a central fibroid.  Alternatively,  the endometrium may be thickened, possibly measuring up to 4.4 cm.  Right ovary: 3.3 x 2.3 x 1.7 cm.  Left ovary:    Not visualized, likely because of intervening bowel loops.  Pulsed Doppler evaluation demonstrates normal low resistance arterial and venous waveforms in the right ovary.  The left ovary is not evaluated.  IMPRESSION:  1.  The central portion of the uterus is difficult to evaluate sonographically, but appears abnormal.  There is possible central uterine fibroid or mass versus a thickened endometrium. Recommend further evaluation with MRI, hysteroscopy, or endometrial biopsy. 2.  Normal appearance of  the right ovary without evidence for torsion. 3.  Non-visualized left ovary.  No sonographic evidence for ovarian torsion.   Original Report Authenticated By: Norva Pavlov, M.D.      1. Dysfunctional uterine bleeding   2. Uterine mass   3. Abdominal pain       MDM  Pt with dysfunctional uterine bleeding.  Questionable fibroid or uterine mass.  Will dc home with pain medications.  Rec follow up  With an OB GYN.  Discussed abnormal finding on [name] and need for follow up.        Celene Kras, MD 03/08/12 1255

## 2012-03-08 NOTE — ED Notes (Signed)
abd pain peri umbilicus area bil lower quad since last night. Nausea no vomiting, chills, no fever, soft abd, vaginal spotting, hx of eptopic preg, last meal 0630 last night. Last bm this am loose stools.

## 2012-03-08 NOTE — ED Notes (Signed)
Patient transported to Ultrasound 

## 2012-03-09 LAB — GC/CHLAMYDIA PROBE AMP
CT Probe RNA: NEGATIVE
GC Probe RNA: NEGATIVE

## 2012-03-25 ENCOUNTER — Encounter: Payer: Self-pay | Admitting: Obstetrics & Gynecology

## 2012-04-02 ENCOUNTER — Encounter: Payer: Self-pay | Admitting: Obstetrics & Gynecology

## 2012-04-12 ENCOUNTER — Encounter: Payer: Self-pay | Admitting: Obstetrics & Gynecology

## 2012-04-28 ENCOUNTER — Encounter (HOSPITAL_COMMUNITY): Payer: Self-pay | Admitting: Emergency Medicine

## 2012-04-28 ENCOUNTER — Emergency Department (HOSPITAL_COMMUNITY)
Admission: EM | Admit: 2012-04-28 | Discharge: 2012-04-28 | Disposition: A | Discharge status: Home / Self Care | Payer: Self-pay | Attending: Emergency Medicine | Admitting: Emergency Medicine

## 2012-04-28 DIAGNOSIS — R102 Pelvic and perineal pain: Secondary | ICD-10-CM

## 2012-04-28 DIAGNOSIS — Z3202 Encounter for pregnancy test, result negative: Secondary | ICD-10-CM | POA: Insufficient documentation

## 2012-04-28 DIAGNOSIS — F17200 Nicotine dependence, unspecified, uncomplicated: Secondary | ICD-10-CM | POA: Insufficient documentation

## 2012-04-28 DIAGNOSIS — R112 Nausea with vomiting, unspecified: Secondary | ICD-10-CM | POA: Insufficient documentation

## 2012-04-28 DIAGNOSIS — Z8742 Personal history of other diseases of the female genital tract: Secondary | ICD-10-CM | POA: Insufficient documentation

## 2012-04-28 DIAGNOSIS — N898 Other specified noninflammatory disorders of vagina: Secondary | ICD-10-CM | POA: Insufficient documentation

## 2012-04-28 DIAGNOSIS — N946 Dysmenorrhea, unspecified: Secondary | ICD-10-CM | POA: Insufficient documentation

## 2012-04-28 DIAGNOSIS — J45909 Unspecified asthma, uncomplicated: Secondary | ICD-10-CM | POA: Insufficient documentation

## 2012-04-28 DIAGNOSIS — R197 Diarrhea, unspecified: Secondary | ICD-10-CM | POA: Insufficient documentation

## 2012-04-28 DIAGNOSIS — N949 Unspecified condition associated with female genital organs and menstrual cycle: Secondary | ICD-10-CM | POA: Insufficient documentation

## 2012-04-28 LAB — URINALYSIS, MICROSCOPIC ONLY
Bilirubin Urine: NEGATIVE
Glucose, UA: NEGATIVE mg/dL
Ketones, ur: NEGATIVE mg/dL
Leukocytes, UA: NEGATIVE
Nitrite: NEGATIVE
Protein, ur: NEGATIVE mg/dL
Specific Gravity, Urine: 1.027 (ref 1.005–1.030)
Urobilinogen, UA: 0.2 mg/dL (ref 0.0–1.0)
pH: 5.5 (ref 5.0–8.0)

## 2012-04-28 LAB — CBC WITH DIFFERENTIAL/PLATELET
Basophils Absolute: 0 10*3/uL (ref 0.0–0.1)
Basophils Relative: 0 % (ref 0–1)
Eosinophils Absolute: 0.1 10*3/uL (ref 0.0–0.7)
Eosinophils Relative: 1 % (ref 0–5)
HCT: 31.1 % — ABNORMAL LOW (ref 36.0–46.0)
Hemoglobin: 9.6 g/dL — ABNORMAL LOW (ref 12.0–15.0)
Lymphocytes Relative: 27 % (ref 12–46)
Lymphs Abs: 2.1 10*3/uL (ref 0.7–4.0)
MCH: 24.1 pg — ABNORMAL LOW (ref 26.0–34.0)
MCHC: 30.9 g/dL (ref 30.0–36.0)
MCV: 78.1 fL (ref 78.0–100.0)
Monocytes Absolute: 0.7 10*3/uL (ref 0.1–1.0)
Monocytes Relative: 8 % (ref 3–12)
Neutro Abs: 5.1 10*3/uL (ref 1.7–7.7)
Neutrophils Relative %: 64 % (ref 43–77)
Platelets: 532 10*3/uL — ABNORMAL HIGH (ref 150–400)
RBC: 3.98 MIL/uL (ref 3.87–5.11)
RDW: 18.9 % — ABNORMAL HIGH (ref 11.5–15.5)
WBC: 8 10*3/uL (ref 4.0–10.5)

## 2012-04-28 LAB — WET PREP, GENITAL
Trich, Wet Prep: NONE SEEN
Yeast Wet Prep HPF POC: NONE SEEN

## 2012-04-28 LAB — COMPREHENSIVE METABOLIC PANEL
ALT: 14 U/L (ref 0–35)
AST: 18 U/L (ref 0–37)
Albumin: 3.3 g/dL — ABNORMAL LOW (ref 3.5–5.2)
Alkaline Phosphatase: 75 U/L (ref 39–117)
BUN: 8 mg/dL (ref 6–23)
CO2: 23 mEq/L (ref 19–32)
Calcium: 8.3 mg/dL — ABNORMAL LOW (ref 8.4–10.5)
Chloride: 102 mEq/L (ref 96–112)
Creatinine, Ser: 0.72 mg/dL (ref 0.50–1.10)
GFR calc Af Amer: >90 mL/min (ref >90)
GFR calc non Af Amer: >90 mL/min (ref >90)
Glucose, Bld: 98 mg/dL (ref 70–99)
Potassium: 4 mEq/L (ref 3.5–5.1)
Sodium: 136 mEq/L (ref 135–145)
Total Bilirubin: 0.2 mg/dL — ABNORMAL LOW (ref 0.3–1.2)
Total Protein: 7.6 g/dL (ref 6.0–8.3)

## 2012-04-28 LAB — TYPE AND SCREEN
ABO/RH(D): O POS
Antibody Screen: NEGATIVE

## 2012-04-28 LAB — LIPASE, BLOOD: Lipase: 23 U/L (ref 11–59)

## 2012-04-28 LAB — ABO/RH: ABO/RH(D): O POS

## 2012-04-28 MED ORDER — HYDROMORPHONE HCL PF 2 MG/ML IJ SOLN
2.0000 mg | Freq: Once | INTRAMUSCULAR | Status: AC
  Administered 2012-04-28: 2 mg via INTRAVENOUS

## 2012-04-28 MED ORDER — ONDANSETRON 8 MG PO TBDP
8.0000 mg | ORAL_TABLET | Freq: Once | ORAL | Status: AC
  Administered 2012-04-28: 8 mg via ORAL
  Filled 2012-04-28: qty 1

## 2012-04-28 MED ORDER — HYDROMORPHONE HCL PF 2 MG/ML IJ SOLN
2.0000 mg | Freq: Once | INTRAMUSCULAR | Status: DC
  Filled 2012-04-28: qty 1

## 2012-04-28 MED ORDER — OXYCODONE-ACETAMINOPHEN 5-325 MG PO TABS: 1.0000 | ORAL_TABLET | ORAL | Status: DC | PRN

## 2012-04-28 MED ORDER — OXYCODONE-ACETAMINOPHEN 5-325 MG PO TABS
1.0000 | ORAL_TABLET | Freq: Once | ORAL | Status: AC
  Administered 2012-04-28: 1 via ORAL
  Filled 2012-04-28: qty 1

## 2012-04-28 NOTE — ED Notes (Signed)
Dr. Effie Shy aware that pelvic is set up

## 2012-04-28 NOTE — ED Notes (Signed)
Pt reminded of urine specimen, requests pain meds. Nurse aware

## 2012-04-28 NOTE — ED Provider Notes (Signed)
History     CSN: [id number]  Arrival date & time 04/28/12  1614   First MD Initiated Contact with Patient 04/28/12 1725      Chief Complaint  Patient presents with  . Abdominal Pain  . Nausea  . Emesis  . Diarrhea  . Vaginal Bleeding    (Consider location/radiation/quality/duration/timing/severity/associated sxs/prior treatment) HPI Comments: Marisa [name] is a 37 y.o. Female who complains of abdominal pain for 2 days. The pain is persistent, and worsening. She has had similar pain in the past. She was last seen in the emergency department for the pain one month ago. She has not seen another doctor since then. She was referred to GYN but did not go. 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 Imaging last month showed a normal right ovary and absent left ovary. She's recently had her left ovary removed when she had an ectopic pregnancy. She began having menstrual bleeding 2 days ago. Previous headache, and she passes clots. She feels weak. She denies fever, chills, nausea, or vomiting. There are no known modifying factors.  Patient is a 37 y.o. female presenting with abdominal pain, vomiting, diarrhea, and vaginal bleeding. The history is provided by the patient.  Abdominal Pain Associated symptoms: diarrhea, vaginal bleeding and vomiting   Emesis Associated symptoms: abdominal pain and diarrhea   Diarrhea Associated symptoms: abdominal pain and vomiting   Vaginal Bleeding Associated symptoms include abdominal pain.    Past Medical History  Diagnosis Date  . Asthma   . EP (ectopic pregnancy)   . Ovarian tumor     Past Surgical History  Procedure Laterality Date  . Cholecystectomy  2005  . Oophorectomy  2009    left    Family History  Problem Relation Age of Onset  . Hypertension Mother   . Diabetes Mother   . Cancer Father   . Hyperlipidemia Father   . Hypertension Father     History  Substance Use Topics  . Smoking status: Current Some  Day Smoker -- 5 years    Types: Cigarettes  . Smokeless tobacco: Not on file  . Alcohol Use: 0.6 oz/week    1 Cans of beer per week     Comment: one beer daily    OB History   Grav Para Term Preterm Abortions TAB SAB Ect Mult Living   3 1   1  1          Review of Systems  Gastrointestinal: Positive for vomiting, abdominal pain and diarrhea.  Genitourinary: Positive for vaginal bleeding.  All other systems reviewed and are negative.    Allergies  Penicillins and Shellfish allergy  Home Medications   Current Outpatient Rx  Name  Route  Sig  Dispense  Refill  . HYDROcodone-acetaminophen (NORCO) 5-325 MG per tablet   Oral   Take 1-2 tablets by mouth every 6 (six) hours as needed for pain.   16 tablet   0   . oxyCODONE-acetaminophen (PERCOCET/ROXICET) 5-325 MG per tablet   Oral   Take 1 tablet by mouth every 4 (four) hours as needed for pain.   20 tablet   0     BP 150/92  Pulse 72  Temp(Src) 98.2 F (36.8 C) (Oral)  Resp 18  SpO2 100%  LMP 04/28/2012  Physical Exam  Nursing note and vitals reviewed. Constitutional: She is oriented to person, place, and time. She appears well-developed and well-nourished.  HENT:  Head: Normocephalic and atraumatic.  Eyes: Conjunctivae  and EOM are normal. Pupils are equal, round, and reactive to light.  Neck: Normal range of motion and phonation normal. Neck supple.  Cardiovascular: Normal rate, regular rhythm and intact distal pulses.   Pulmonary/Chest: Effort normal and breath sounds normal. She exhibits no tenderness.  Abdominal: Soft. She exhibits no distension. There is no tenderness. There is no guarding.  Genitourinary:  Normal external female genitalia. Mild to moderate amount of dark blood in the vagina. The source of the blood is the cervical os. The cervix appears normal. GC and Chlamydia probes taken from the cervical os. Wet prep from the vaginal secretions. On bimanual examination. There is diffuse midline  tenderness. I am unable to palpate uterine enlargement or ovarian enlargement or adnexal mass. There is no significant adnexal tenderness.  Musculoskeletal: Normal range of motion.  Neurological: She is alert and oriented to person, place, and time. She has normal strength. She exhibits normal muscle tone.  Skin: Skin is warm and dry.  Psychiatric: She has a normal mood and affect. Her behavior is normal. Judgment and thought content normal.    ED Course  Procedures (including critical care time)   Emergency department treatment: Analgesia with Dilaudid, IM, followed by Percocet, by mouth.  Nursing notes, applicable records and vitals reviewed. Agree without changes.  Radiologic Images/Reports reviewed.    Labs Reviewed  WET PREP, GENITAL - Abnormal; Notable for the following:    Clue Cells Wet Prep HPF POC FEW (*)    WBC, Wet Prep HPF POC RARE (*)    All other components within normal limits  CBC WITH DIFFERENTIAL - Abnormal; Notable for the following:    Hemoglobin 9.6 (*)    HCT 31.1 (*)    MCH 24.1 (*)    RDW 18.9 (*)    Platelets 532 (*)    All other components within normal limits  COMPREHENSIVE METABOLIC PANEL - Abnormal; Notable for the following:    Calcium 8.3 (*)    Albumin 3.3 (*)    Total Bilirubin 0.2 (*)    All other components within normal limits  URINALYSIS, MICROSCOPIC ONLY - Abnormal; Notable for the following:    APPearance CLOUDY (*)    Hgb urine dipstick MODERATE (*)    Bacteria, UA FEW (*)    All other components within normal limits  GC/CHLAMYDIA PROBE AMP  LIPASE, BLOOD  TYPE AND SCREEN  ABO/RH    Nursing Notes Reviewed/ Care Coordinated, and agree without changes. Applicable Imaging Reviewed Interpretation of Laboratory Data incorporated into ED treatment  1. Pelvic pain   2. Dysmenorrhea       MDM  Evaluation is consistent with dysmenorrhea. This is likely aggravated by a probable uterine fibroid. There is no evidence for ovarian  tumor on recent ultrasound imaging. Patient stable for discharge with outpatient management by a gynecologist. Doubt PID, impending vascular collapse, metabolic instability.    Plan: Home Medications- Percocet; Home Treatments- rest; Recommended follow up- GYN followup as soon as possible    Flint Melter, MD 04/29/12 [phone]

## 2012-04-28 NOTE — ED Notes (Addendum)
Pt states that she has an ovarian tumor.  States that 2 days ago, she began having abd pain, NVD.  Vaginal bleeding x 3 days.  States that the bleeding has been excessive and heavy.  States that she has been passing softball size clots.  States she is weak and tired.

## 2012-04-28 NOTE — ED Notes (Signed)
Dr. Effie Shy reminded that pelvic is ready

## 2012-04-28 NOTE — Progress Notes (Signed)
During [facility] ED 04/28/12 visit CM spoke with pt who confirms self pay [facility] resident with no pcp.CM spoke with pt who confirms self pay [facility] resident with no pcp. CM discussed and provided written information for self pay pcps, importance of pcp for f/u care, www.needymeds.org, discounted pharmacies, MATCH program and other [place] resources such as financial assistance, DSS and  health department Reviewed Health connect number to assist with finding self pay provider close to pt's residence. Reviewed resources for [place] self pay pcps like [facility], family medicine at [address], [facility], general medical clinics, [facility] urgent care plus others, [facility] out patient [facility]ies, housing, affordable care act/health reform (deadline 05/17/12) and other resources in [employer]. Pt voiced understanding and appreciation of resources provided

## 2012-04-29 LAB — GC/CHLAMYDIA PROBE AMP
CT Probe RNA: NEGATIVE
GC Probe RNA: NEGATIVE

## 2012-04-29 LAB — POCT PREGNANCY, URINE: Preg Test, Ur: NEGATIVE

## 2012-05-05 ENCOUNTER — Encounter: Payer: Self-pay | Admitting: Obstetrics & Gynecology

## 2012-05-18 ENCOUNTER — Emergency Department (HOSPITAL_COMMUNITY): Payer: Self-pay

## 2012-05-18 ENCOUNTER — Encounter (HOSPITAL_COMMUNITY): Payer: Self-pay | Admitting: Emergency Medicine

## 2012-05-18 ENCOUNTER — Emergency Department (HOSPITAL_COMMUNITY)
Admission: EM | Admit: 2012-05-18 | Discharge: 2012-05-18 | Disposition: A | Discharge status: Home / Self Care | Payer: Self-pay | Attending: Emergency Medicine | Admitting: Emergency Medicine

## 2012-05-18 DIAGNOSIS — R1084 Generalized abdominal pain: Secondary | ICD-10-CM | POA: Insufficient documentation

## 2012-05-18 DIAGNOSIS — F17200 Nicotine dependence, unspecified, uncomplicated: Secondary | ICD-10-CM | POA: Insufficient documentation

## 2012-05-18 DIAGNOSIS — R112 Nausea with vomiting, unspecified: Secondary | ICD-10-CM | POA: Insufficient documentation

## 2012-05-18 DIAGNOSIS — Z8742 Personal history of other diseases of the female genital tract: Secondary | ICD-10-CM | POA: Insufficient documentation

## 2012-05-18 DIAGNOSIS — R109 Unspecified abdominal pain: Secondary | ICD-10-CM

## 2012-05-18 DIAGNOSIS — R197 Diarrhea, unspecified: Secondary | ICD-10-CM | POA: Insufficient documentation

## 2012-05-18 DIAGNOSIS — J45909 Unspecified asthma, uncomplicated: Secondary | ICD-10-CM | POA: Insufficient documentation

## 2012-05-18 DIAGNOSIS — R1110 Vomiting, unspecified: Secondary | ICD-10-CM

## 2012-05-18 LAB — PREGNANCY, URINE: Preg Test, Ur: NEGATIVE

## 2012-05-18 LAB — URINALYSIS, ROUTINE W REFLEX MICROSCOPIC
Glucose, UA: NEGATIVE mg/dL
Hgb urine dipstick: NEGATIVE
Ketones, ur: NEGATIVE mg/dL
Leukocytes, UA: NEGATIVE
Nitrite: NEGATIVE
Protein, ur: NEGATIVE mg/dL
Specific Gravity, Urine: 1.031 — ABNORMAL HIGH (ref 1.005–1.030)
Urobilinogen, UA: 0.2 mg/dL (ref 0.0–1.0)
pH: 5.5 (ref 5.0–8.0)

## 2012-05-18 LAB — CBC WITH DIFFERENTIAL/PLATELET
Basophils Absolute: 0 10*3/uL (ref 0.0–0.1)
Basophils Relative: 1 % (ref 0–1)
Eosinophils Absolute: 0.1 10*3/uL (ref 0.0–0.7)
Eosinophils Relative: 1 % (ref 0–5)
HCT: 32.4 % — ABNORMAL LOW (ref 36.0–46.0)
Hemoglobin: 10.3 g/dL — ABNORMAL LOW (ref 12.0–15.0)
Lymphocytes Relative: 35 % (ref 12–46)
Lymphs Abs: 2.2 10*3/uL (ref 0.7–4.0)
MCH: 24.9 pg — ABNORMAL LOW (ref 26.0–34.0)
MCHC: 31.8 g/dL (ref 30.0–36.0)
MCV: 78.3 fL (ref 78.0–100.0)
Monocytes Absolute: 0.8 10*3/uL (ref 0.1–1.0)
Monocytes Relative: 12 % (ref 3–12)
Neutro Abs: 3.3 10*3/uL (ref 1.7–7.7)
Neutrophils Relative %: 52 % (ref 43–77)
Platelets: 479 10*3/uL — ABNORMAL HIGH (ref 150–400)
RBC: 4.14 MIL/uL (ref 3.87–5.11)
RDW: 18.5 % — ABNORMAL HIGH (ref 11.5–15.5)
WBC: 6.3 10*3/uL (ref 4.0–10.5)

## 2012-05-18 LAB — COMPREHENSIVE METABOLIC PANEL
ALT: 10 U/L (ref 0–35)
AST: 14 U/L (ref 0–37)
Albumin: 3.2 g/dL — ABNORMAL LOW (ref 3.5–5.2)
Alkaline Phosphatase: 66 U/L (ref 39–117)
BUN: 5 mg/dL — ABNORMAL LOW (ref 6–23)
CO2: 22 mEq/L (ref 19–32)
Calcium: 8.7 mg/dL (ref 8.4–10.5)
Chloride: 103 mEq/L (ref 96–112)
Creatinine, Ser: 0.65 mg/dL (ref 0.50–1.10)
GFR calc Af Amer: >90 mL/min (ref >90)
GFR calc non Af Amer: >90 mL/min (ref >90)
Glucose, Bld: 95 mg/dL (ref 70–99)
Potassium: 4 mEq/L (ref 3.5–5.1)
Sodium: 136 mEq/L (ref 135–145)
Total Bilirubin: 0.2 mg/dL — ABNORMAL LOW (ref 0.3–1.2)
Total Protein: 7.2 g/dL (ref 6.0–8.3)

## 2012-05-18 LAB — LIPASE, BLOOD: Lipase: 28 U/L (ref 11–59)

## 2012-05-18 MED ORDER — ONDANSETRON HCL 4 MG/2ML IJ SOLN
4.0000 mg | Freq: Once | INTRAMUSCULAR | Status: AC
  Administered 2012-05-18: 4 mg via INTRAVENOUS
  Filled 2012-05-18: qty 2

## 2012-05-18 MED ORDER — PROMETHAZINE HCL 25 MG PO TABS: 25.0000 mg | ORAL_TABLET | Freq: Four times a day (QID) | ORAL | Status: DC | PRN

## 2012-05-18 MED ORDER — MORPHINE SULFATE 4 MG/ML IJ SOLN
4.0000 mg | Freq: Once | INTRAMUSCULAR | Status: AC
  Administered 2012-05-18: 4 mg via INTRAVENOUS
  Filled 2012-05-18: qty 1

## 2012-05-18 MED ORDER — SODIUM CHLORIDE 0.9 % IV BOLUS (SEPSIS)
1000.0000 mL | Freq: Once | INTRAVENOUS | Status: AC
  Administered 2012-05-18: 1000 mL via INTRAVENOUS

## 2012-05-18 MED ORDER — TRAMADOL HCL 50 MG PO TABS: 50.0000 mg | ORAL_TABLET | Freq: Four times a day (QID) | ORAL | Status: DC | PRN

## 2012-05-18 MED ORDER — KETOROLAC TROMETHAMINE 30 MG/ML IJ SOLN
30.0000 mg | Freq: Once | INTRAMUSCULAR | Status: AC
  Administered 2012-05-18: 30 mg via INTRAVENOUS
  Filled 2012-05-18: qty 1

## 2012-05-18 MED ORDER — IOHEXOL 300 MG/ML  SOLN
100.0000 mL | Freq: Once | INTRAMUSCULAR | Status: AC | PRN
  Administered 2012-05-18: 100 mL via INTRAVENOUS

## 2012-05-18 MED ORDER — DIPHENOXYLATE-ATROPINE 2.5-0.025 MG PO TABS: 1.0000 | ORAL_TABLET | Freq: Four times a day (QID) | ORAL | Status: DC | PRN

## 2012-05-18 MED ORDER — IOHEXOL 300 MG/ML  SOLN
50.0000 mL | Freq: Once | INTRAMUSCULAR | Status: AC | PRN
  Administered 2012-05-18: 50 mL via ORAL

## 2012-05-18 NOTE — ED Provider Notes (Signed)
History     CSN: [id number]  Arrival date & time 05/18/12  0910   First MD Initiated Contact with Patient 05/18/12 [phone]      Chief Complaint  Patient presents with  . Nausea  . Emesis  . Abdominal Pain    (Consider location/radiation/quality/duration/timing/severity/associated sxs/prior treatment) HPI Comments: Patient presents to the ER for evaluation of nausea, vomiting and diarrhea. She reports that the symptoms have been present for several days. Patient reports diffuse abdominal cramping associated with vomiting and diarrhea. She has not had a fever. Pain is sharp and crampy, severe at times.  Patient is a 37 y.o. female presenting with vomiting and abdominal pain.  Emesis Associated symptoms: abdominal pain and diarrhea   Abdominal Pain Associated symptoms: diarrhea, nausea and vomiting   Associated symptoms: no fever     Past Medical History  Diagnosis Date  . Asthma   . EP (ectopic pregnancy)   . Ovarian tumor     Past Surgical History  Procedure Laterality Date  . Cholecystectomy  2005  . Oophorectomy  2009    left    Family History  Problem Relation Age of Onset  . Hypertension Mother   . Diabetes Mother   . Cancer Father   . Hyperlipidemia Father   . Hypertension Father     History  Substance Use Topics  . Smoking status: Current Some Day Smoker -- 5 years    Types: Cigarettes  . Smokeless tobacco: Not on file  . Alcohol Use: 0.6 oz/week    1 Cans of beer per week     Comment: one beer daily    OB History   Grav Para Term Preterm Abortions TAB SAB Ect Mult Living   3 1   1  1          Review of Systems  Constitutional: Negative for fever.  Gastrointestinal: Positive for nausea, vomiting, abdominal pain and diarrhea.  All other systems reviewed and are negative.    Allergies  Penicillins and Shellfish allergy  Home Medications   Current Outpatient Rx  Name  Route  Sig  Dispense  Refill  . CAPSAICIN HOT PATCH EX   Apply  externally   Apply 1 patch topically daily as needed (for back pain.).         Marland Kitchen oxyCODONE-acetaminophen (PERCOCET/ROXICET) 5-325 MG per tablet   Oral   Take 1 tablet by mouth every 4 (four) hours as needed for pain.   20 tablet   0     BP 114/79  Pulse 89  Temp(Src) 97.5 F (36.4 C) (Oral)  SpO2 100%  LMP 04/28/2012  Physical Exam  Constitutional: She is oriented to person, place, and time. She appears well-developed and well-nourished. No distress.  HENT:  Head: Normocephalic and atraumatic.  Right Ear: Hearing normal.  Nose: Nose normal.  Mouth/Throat: Oropharynx is clear and moist and mucous membranes are normal.  Eyes: Conjunctivae and EOM are normal. Pupils are equal, round, and reactive to light.  Neck: Normal range of motion. Neck supple.  Cardiovascular: Normal rate, regular rhythm, S1 normal and S2 normal.  Exam reveals no gallop and no friction rub.   No murmur heard. Pulmonary/Chest: Effort normal and breath sounds normal. No respiratory distress. She exhibits no tenderness.  Abdominal: Soft. Normal appearance and bowel sounds are normal. There is no hepatosplenomegaly. There is generalized tenderness. There is no rebound, no guarding, no tenderness at McBurney's point and negative Murphy's sign. No hernia.  Abdominal pain is diffuse  and there is no guarding or rebound, but patient indicates that the pain is worst in the right lower abdomen  Musculoskeletal: Normal range of motion.  Neurological: She is alert and oriented to person, place, and time. She has normal strength. No cranial nerve deficit or sensory deficit. Coordination normal. GCS eye subscore is 4. GCS verbal subscore is 5. GCS motor subscore is 6.  Skin: Skin is warm, dry and intact. No rash noted. No cyanosis.  Psychiatric: She has a normal mood and affect. Her speech is normal and behavior is normal. Thought content normal.    ED Course  Procedures (including critical care time)  Labs Reviewed   CBC WITH DIFFERENTIAL - Abnormal; Notable for the following:    Hemoglobin 10.3 (*)    HCT 32.4 (*)    MCH 24.9 (*)    RDW 18.5 (*)    Platelets 479 (*)    All other components within normal limits  COMPREHENSIVE METABOLIC PANEL - Abnormal; Notable for the following:    BUN 5 (*)    Albumin 3.2 (*)    Total Bilirubin 0.2 (*)    All other components within normal limits  URINALYSIS, ROUTINE W REFLEX MICROSCOPIC - Abnormal; Notable for the following:    Color, Urine AMBER (*)    APPearance CLOUDY (*)    Specific Gravity, Urine 1.031 (*)    Bilirubin Urine SMALL (*)    All other components within normal limits  PREGNANCY, URINE  LIPASE, BLOOD   Ct Abdomen Pelvis W Contrast  05/18/2012  *RADIOLOGY REPORT*  Clinical Data: Right abdominal pain radiating to left side since yesterday. Left oophorectomy for ectopic pregnancy.  Post cholecystectomy.  CT ABDOMEN AND PELVIS WITH CONTRAST  Technique:  Multidetector CT imaging of the abdomen and pelvis was performed following the standard protocol during bolus administration of intravenous contrast.  Contrast: OMNIPAQUE IOHEXOL 300 MG/ML  SOLN  Comparison: 03/16/2012 pelvic sonogram.  No comparison CT.  Findings: Scattered small number of colonic diverticula.  Fluid throughout the descending colon and rectosigmoid region without extraluminal bowel inflammatory process, free fluid or free air. Cause of this colonic fluid is indeterminate.  Mild fatty infiltration liver without focal hepatic lesion.  Post cholecystectomy.  No worrisome splenic, pancreatic, renal or adrenal lesion.  No abdominal aortic aneurysm.  No pelvic or abdominal adenopathy.  Enlarged uterus with posterior fundal fibroid distorting endometrium.  Adnexal tissue is noted bilaterally and by CT, left ovarian removal is not confirmed.  Under distended noncontrast filled views of the urinary bladder unremarkable.  Lung bases clear.  IMPRESSION: Fluid throughout the descending colon and  rectosigmoid region without extraluminal bowel inflammatory process, free fluid or free air.  Cause of this colonic fluid is indeterminate.  Mild fatty infiltration liver.  Enlarged uterus with posterior fundal fibroid distorting endometrium.  Adnexal tissue is noted bilaterally and by CT, left ovarian removal is not confirmed.   Original Report Authenticated By: Lacy Duverney, M.D.      Diagnosis: 1. Abdominal pain 2. Vomiting 3. Diarrhea    MDM  Patient comes to the ER for evaluation of vomiting and diarrhea. Patient is difficult to evaluate, she endorses most symptoms. She also cannot adequately describe her pain. Although she indicates that the entire abdomen is tender, exam was not very impressive. Abdomen is soft and there is no voluntary or involuntary guarding. She reports, however, great pain with palpation. She also indicates that the most pain is in the right lower quadrant. Patient therefore  underwent CT scan. CT scan only shows fluid in the colon consistent with her stated history of diarrhea.        Gilda Crease, MD 05/18/12 1249

## 2012-05-18 NOTE — Progress Notes (Signed)
Self pay pt without listing of pcp Pt reports that since CM spoke with her on 04/28/12 she has chosen [facility] for [employer].

## 2012-05-18 NOTE — ED Notes (Signed)
Pt c/o of increased pain to the lower abd area after CT scan. MD notified.

## 2012-05-18 NOTE — ED Notes (Signed)
Patient transported to CT 

## 2012-05-18 NOTE — ED Notes (Signed)
Pt c/o of nausea, vomiting, and diarrhea that started yesterday. Also c/o of lower abd pain that is described as shooting, radiating around to left side. NAD at this time.

## 2012-05-28 ENCOUNTER — Encounter: Payer: Self-pay | Admitting: *Deleted

## 2012-05-28 ENCOUNTER — Encounter: Payer: Self-pay | Admitting: Family Medicine

## 2012-06-14 ENCOUNTER — Emergency Department (HOSPITAL_COMMUNITY): Payer: Self-pay

## 2012-06-14 ENCOUNTER — Encounter (HOSPITAL_COMMUNITY): Payer: Self-pay | Admitting: *Deleted

## 2012-06-14 ENCOUNTER — Emergency Department (HOSPITAL_COMMUNITY)
Admission: EM | Admit: 2012-06-14 | Discharge: 2012-06-14 | Disposition: A | Discharge status: Home / Self Care | Payer: Self-pay | Attending: Emergency Medicine | Admitting: Emergency Medicine

## 2012-06-14 DIAGNOSIS — Z79899 Other long term (current) drug therapy: Secondary | ICD-10-CM | POA: Insufficient documentation

## 2012-06-14 DIAGNOSIS — R5381 Other malaise: Secondary | ICD-10-CM | POA: Insufficient documentation

## 2012-06-14 DIAGNOSIS — R109 Unspecified abdominal pain: Secondary | ICD-10-CM | POA: Insufficient documentation

## 2012-06-14 DIAGNOSIS — D259 Leiomyoma of uterus, unspecified: Secondary | ICD-10-CM | POA: Insufficient documentation

## 2012-06-14 DIAGNOSIS — R42 Dizziness and giddiness: Secondary | ICD-10-CM | POA: Insufficient documentation

## 2012-06-14 DIAGNOSIS — N938 Other specified abnormal uterine and vaginal bleeding: Secondary | ICD-10-CM | POA: Insufficient documentation

## 2012-06-14 DIAGNOSIS — Z862 Personal history of diseases of the blood and blood-forming organs and certain disorders involving the immune mechanism: Secondary | ICD-10-CM | POA: Insufficient documentation

## 2012-06-14 DIAGNOSIS — Z8639 Personal history of other endocrine, nutritional and metabolic disease: Secondary | ICD-10-CM | POA: Insufficient documentation

## 2012-06-14 DIAGNOSIS — N949 Unspecified condition associated with female genital organs and menstrual cycle: Secondary | ICD-10-CM | POA: Insufficient documentation

## 2012-06-14 DIAGNOSIS — J45909 Unspecified asthma, uncomplicated: Secondary | ICD-10-CM | POA: Insufficient documentation

## 2012-06-14 DIAGNOSIS — F17200 Nicotine dependence, unspecified, uncomplicated: Secondary | ICD-10-CM | POA: Insufficient documentation

## 2012-06-14 DIAGNOSIS — Z3202 Encounter for pregnancy test, result negative: Secondary | ICD-10-CM | POA: Insufficient documentation

## 2012-06-14 DIAGNOSIS — D219 Benign neoplasm of connective and other soft tissue, unspecified: Secondary | ICD-10-CM

## 2012-06-14 LAB — URINALYSIS, ROUTINE W REFLEX MICROSCOPIC
Bilirubin Urine: NEGATIVE
Glucose, UA: NEGATIVE mg/dL
Ketones, ur: NEGATIVE mg/dL
Leukocytes, UA: NEGATIVE
Nitrite: NEGATIVE
Protein, ur: NEGATIVE mg/dL
Specific Gravity, Urine: 1.012 (ref 1.005–1.030)
Urobilinogen, UA: 0.2 mg/dL (ref 0.0–1.0)
pH: 6 (ref 5.0–8.0)

## 2012-06-14 LAB — URINE MICROSCOPIC-ADD ON

## 2012-06-14 LAB — COMPREHENSIVE METABOLIC PANEL
ALT: 11 U/L (ref 0–35)
AST: 12 U/L (ref 0–37)
Albumin: 3.1 g/dL — ABNORMAL LOW (ref 3.5–5.2)
Alkaline Phosphatase: 74 U/L (ref 39–117)
BUN: 8 mg/dL (ref 6–23)
CO2: 25 mEq/L (ref 19–32)
Calcium: 9 mg/dL (ref 8.4–10.5)
Chloride: 105 mEq/L (ref 96–112)
Creatinine, Ser: 0.67 mg/dL (ref 0.50–1.10)
GFR calc Af Amer: >90 mL/min (ref >90)
GFR calc non Af Amer: >90 mL/min (ref >90)
Glucose, Bld: 90 mg/dL (ref 70–99)
Potassium: 4.1 mEq/L (ref 3.5–5.1)
Sodium: 137 mEq/L (ref 135–145)
Total Bilirubin: 0.1 mg/dL — ABNORMAL LOW (ref 0.3–1.2)
Total Protein: 7 g/dL (ref 6.0–8.3)

## 2012-06-14 LAB — CBC WITH DIFFERENTIAL/PLATELET
Basophils Absolute: 0.1 10*3/uL (ref 0.0–0.1)
Basophils Relative: 1 % (ref 0–1)
Eosinophils Absolute: 0.1 10*3/uL (ref 0.0–0.7)
Eosinophils Relative: 2 % (ref 0–5)
HCT: 31.5 % — ABNORMAL LOW (ref 36.0–46.0)
Hemoglobin: 10.1 g/dL — ABNORMAL LOW (ref 12.0–15.0)
Lymphocytes Relative: 34 % (ref 12–46)
Lymphs Abs: 1.8 10*3/uL (ref 0.7–4.0)
MCH: 25.3 pg — ABNORMAL LOW (ref 26.0–34.0)
MCHC: 32.1 g/dL (ref 30.0–36.0)
MCV: 78.8 fL (ref 78.0–100.0)
Monocytes Absolute: 0.4 10*3/uL (ref 0.1–1.0)
Monocytes Relative: 7 % (ref 3–12)
Neutro Abs: 3 10*3/uL (ref 1.7–7.7)
Neutrophils Relative %: 56 % (ref 43–77)
Platelets: ADEQUATE 10*3/uL (ref 150–400)
RBC: 4 MIL/uL (ref 3.87–5.11)
RDW: 19 % — ABNORMAL HIGH (ref 11.5–15.5)
WBC: 5.4 10*3/uL (ref 4.0–10.5)

## 2012-06-14 LAB — WET PREP, GENITAL
Clue Cells Wet Prep HPF POC: NONE SEEN
Trich, Wet Prep: NONE SEEN
Yeast Wet Prep HPF POC: NONE SEEN

## 2012-06-14 LAB — GC/CHLAMYDIA PROBE AMP
CT Probe RNA: NEGATIVE
GC Probe RNA: NEGATIVE

## 2012-06-14 LAB — PROTIME-INR
INR: 0.85 (ref 0.00–1.49)
Prothrombin Time: 11.6 seconds (ref 11.6–15.2)

## 2012-06-14 LAB — PREGNANCY, URINE: Preg Test, Ur: NEGATIVE

## 2012-06-14 MED ORDER — SODIUM CHLORIDE 0.9 % IV BOLUS (SEPSIS)
1000.0000 mL | Freq: Once | INTRAVENOUS | Status: AC
  Administered 2012-06-14: 1000 mL via INTRAVENOUS

## 2012-06-14 MED ORDER — MORPHINE SULFATE 4 MG/ML IJ SOLN
INTRAMUSCULAR | Status: AC
  Filled 2012-06-14: qty 1

## 2012-06-14 MED ORDER — ONDANSETRON HCL 4 MG/2ML IJ SOLN
4.0000 mg | Freq: Once | INTRAMUSCULAR | Status: AC
  Administered 2012-06-14: 4 mg via INTRAVENOUS
  Filled 2012-06-14: qty 2

## 2012-06-14 MED ORDER — OXYCODONE-ACETAMINOPHEN 5-325 MG PO TABS: 2.0000 | ORAL_TABLET | ORAL | Status: DC | PRN

## 2012-06-14 MED ORDER — MORPHINE SULFATE 4 MG/ML IJ SOLN
4.0000 mg | Freq: Once | INTRAMUSCULAR | Status: AC
  Administered 2012-06-14: 4 mg via INTRAVENOUS
  Filled 2012-06-14: qty 1

## 2012-06-14 MED ORDER — ONDANSETRON HCL 4 MG PO TABS: 4.0000 mg | ORAL_TABLET | Freq: Four times a day (QID) | ORAL | Status: DC

## 2012-06-14 MED ORDER — MORPHINE SULFATE 4 MG/ML IJ SOLN
4.0000 mg | Freq: Once | INTRAMUSCULAR | Status: AC
  Administered 2012-06-14: 4 mg via INTRAVENOUS

## 2012-06-14 NOTE — ED Notes (Signed)
Pt given and explained discharge instructions; stated understanding; pt stable at discharge 

## 2012-06-14 NOTE — ED Notes (Signed)
Pt ambulated in room; c/o of some dizziness, able to walk with assistance

## 2012-06-14 NOTE — ED Notes (Signed)
Per pt report: pt c/o of heavy vaginal bleeding since Friday.  Pt reports going through 3 pads in an hour.  Pt also reports severe pain in lower abd that radiates to her back.  Pt has tried over the counter pain medications with no relief.  Pt had her last menstrual period this month on April 4th. Pt also reports feelings a weakness and dizziness today.  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

## 2012-06-14 NOTE — ED Notes (Signed)
Patient transported to Ultrasound 

## 2012-06-14 NOTE — ED Provider Notes (Signed)
History     CSN: [id number]  Arrival date & time 06/14/12  9528   First MD Initiated Contact with Patient 06/14/12 [phone]      No chief complaint on file.   (Consider location/radiation/quality/duration/timing/severity/associated sxs/prior treatment) HPI Comments: Patient reports heavy vaginal bleeding for the past she states she's "gone through 3 pads every half hour". She has severe lower abdominal pain it radiates to her low back. She denies any nausea, vomiting or fevers. She's had previous ED visits for vaginal bleeding and diagnosed with fibroids. She's been referred to gynecology but has not been there. Denies any chest pain or shortness of breath. Does have some lightheadedness and dizziness with standing. No dysuria hematuria. Normal bowel movements. No change in urine output.  The history is provided by the patient and the spouse.    Past Medical History  Diagnosis Date  . Asthma   . EP (ectopic pregnancy)   . Ovarian tumor   . Tumor, thyroid     Past Surgical History  Procedure Laterality Date  . Cholecystectomy  2005  . Oophorectomy  2009    left    Family History  Problem Relation Age of Onset  . Hypertension Mother   . Diabetes Mother   . Cancer Father   . Hyperlipidemia Father   . Hypertension Father     History  Substance Use Topics  . Smoking status: Current Some Day Smoker -- 5 years    Types: Cigarettes  . Smokeless tobacco: Not on file  . Alcohol Use: 0.6 oz/week    1 Cans of beer per week     Comment: one beer daily    OB History   Grav Para Term Preterm Abortions TAB SAB Ect Mult Living   3 1   1  1          Review of Systems  Constitutional: Positive for activity change, appetite change and fatigue. Negative for fever.  HENT: Negative for congestion and rhinorrhea.   Respiratory: Negative for cough, chest tightness and shortness of breath.   Cardiovascular: Negative for chest pain.  Gastrointestinal: Positive for nausea. Negative for  vomiting and abdominal pain.  Genitourinary: Positive for vaginal bleeding, vaginal pain and menstrual problem. Negative for dysuria, hematuria and vaginal discharge.  Musculoskeletal: Positive for back pain.  Skin: Negative for rash.  Neurological: Positive for dizziness, weakness and light-headedness.  A complete 10 system review of systems was obtained and all systems are negative except as noted in the HPI and PMH.    Allergies  Penicillins and Shellfish allergy  Home Medications   Current Outpatient Rx  Name  Route  Sig  Dispense  Refill  . diphenoxylate-atropine (LOMOTIL) 2.5-0.025 MG per tablet   Oral   Take 1 tablet by mouth 4 (four) times daily as needed for diarrhea or loose stools.   30 tablet   0   . ibuprofen (ADVIL,MOTRIN) 200 MG tablet   Oral   Take 200 mg by mouth every 6 (six) hours as needed for pain.         Marland Kitchen oxyCODONE-acetaminophen (PERCOCET/ROXICET) 5-325 MG per tablet   Oral   Take 1 tablet by mouth every 4 (four) hours as needed for pain.   20 tablet   0   . traMADol (ULTRAM) 50 MG tablet   Oral   Take 1 tablet (50 mg total) by mouth every 6 (six) hours as needed for pain.   15 tablet   0   . CAPSAICIN  HOT PATCH EX   Apply externally   Apply 1 patch topically daily as needed (for back pain.).           BP 131/96  Pulse 86  Temp(Src) 98 F (36.7 C) (Oral)  Resp 16  Ht 5\' 4"  (1.626 m)  Wt 168 lb (76.204 kg)  BMI 28.82 kg/m2  SpO2 100%  LMP 05/21/2012  Physical Exam  Constitutional: She is oriented to person, place, and time. She appears well-developed and well-nourished. No distress.  HENT:  Head: Normocephalic and atraumatic.  Mouth/Throat: Oropharynx is clear and moist.  Eyes: Conjunctivae and EOM are normal. Pupils are equal, round, and reactive to light.  Neck: Normal range of motion. Neck supple.  Cardiovascular: Normal rate, regular rhythm and normal heart sounds.   No murmur heard. Pulmonary/Chest: Effort normal and  breath sounds normal. No respiratory distress.  Abdominal: Soft. There is tenderness. There is no rebound and no guarding.  TTP lower abdomen, no palpable masses.  No peritoneal signs.   Genitourinary:  Normal external genitalia. Dark blood in the vaginal vault clots. No active bleeding. Source of blood appears to be [name]. Uterus is tender to palpation. No adnexal masses no CMT  Musculoskeletal: Normal range of motion. She exhibits no edema and no tenderness.  Neurological: She is alert and oriented to person, place, and time. No cranial nerve deficit. She exhibits normal muscle tone. Coordination normal.  Skin: Skin is warm.    ED Course  Procedures (including critical care time)  Labs Reviewed  WET PREP, GENITAL - Abnormal; Notable for the following:    WBC, Wet Prep HPF POC FEW (*)    All other components within normal limits  CBC WITH DIFFERENTIAL - Abnormal; Notable for the following:    Hemoglobin 10.1 (*)    HCT 31.5 (*)    MCH 25.3 (*)    RDW 19.0 (*)    All other components within normal limits  URINALYSIS, ROUTINE W REFLEX MICROSCOPIC - Abnormal; Notable for the following:    Hgb urine dipstick LARGE (*)    All other components within normal limits  COMPREHENSIVE METABOLIC PANEL - Abnormal; Notable for the following:    Albumin 3.1 (*)    Total Bilirubin 0.1 (*)    All other components within normal limits  URINE MICROSCOPIC-ADD ON - Abnormal; Notable for the following:    Squamous Epithelial / LPF FEW (*)    All other components within normal limits  GC/CHLAMYDIA PROBE AMP  PREGNANCY, URINE  PROTIME-INR   US Transvaginal Non-ob  06/14/2012  *RADIOLOGY REPORT*  Clinical Data: 37 year old female with right side pelvic pain and bleeding.  The patient reports history for prior left side ectopic pregnancy.  TRANSABDOMINAL AND TRANSVAGINAL ULTRASOUND OF PELVIS Technique:  Both transabdominal and transvaginal ultrasound examinations of the pelvis were performed. Transabdominal  technique was performed for global imaging of the pelvis including uterus, ovaries, adnexal regions, and pelvic cul-de-sac.  It was necessary to proceed with endovaginal exam following the transabdominal exam to visualize the adnexa.  Comparison:  CT abdomen pelvis 05/18/2012.  Findings:  Uterus: Mildly enlarged.  10.8 x 6.3 x 5.8 cm.  Posterior fibroid more apparent on the recent comparison measuring roughly 52 x 33 x 50 mm (image 21).  Endometrium: Homogeneous, up to 8 mm in thickness.  Right ovary:  3.7 x 2.8 x 1.2 cm.  Hypoechoic area centrally measuring up to 2 cm (image 39).  There is some color flow evident in the left  ovarian parenchyma on brief color Doppler interrogation (image 32).  Left ovary: Cannot be visualized despite transabdominal and transvaginal technique.  The left ovary is visible on the recent comparison.  Other findings: Trace simple appearing pelvic free fluid in the cul- de-sac.  IMPRESSION: 1.  Posterior uterine fibroid again noted up to 5 cm diameter. 2.  Right adnexal 2 cm hypoechoic area with no solid elements identified.  Probable hemorrhagic cyst.  No imaging follow up needed in this age group. 3.  Left adnexa not visible today, was seen on the recent CT abdomen and pelvis. 4.  Trace pelvic free fluid.  Recommendation in #2 follows the Society of Radiologists in Ultrasound 2010 Consensus  Conference Statement (D Lavonda Jumbo al. Management of Asymptomatic Ovarian and Other Adnexal Cysts Imaged at [name]:  Society of Radiologists in Ultrasound Consensus Conference Statement 2010.  Radiology 256 (Sept 2010): 943-954.).   Original Report Authenticated By: Erskine Speed, M.D.    US Pelvis Complete  06/14/2012  *RADIOLOGY REPORT*  Clinical Data: 37 year old female with right side pelvic pain and bleeding.  The patient reports history for prior left side ectopic pregnancy.  TRANSABDOMINAL AND TRANSVAGINAL ULTRASOUND OF PELVIS Technique:  Both transabdominal and transvaginal ultrasound  examinations of the pelvis were performed. Transabdominal technique was performed for global imaging of the pelvis including uterus, ovaries, adnexal regions, and pelvic cul-de-sac.  It was necessary to proceed with endovaginal exam following the transabdominal exam to visualize the adnexa.  Comparison:  CT abdomen pelvis 05/18/2012.  Findings:  Uterus: Mildly enlarged.  10.8 x 6.3 x 5.8 cm.  Posterior fibroid more apparent on the recent comparison measuring roughly 52 x 33 x 50 mm (image 21).  Endometrium: Homogeneous, up to 8 mm in thickness.  Right ovary:  3.7 x 2.8 x 1.2 cm.  Hypoechoic area centrally measuring up to 2 cm (image 39).  There is some color flow evident in the left ovarian parenchyma on brief color Doppler interrogation (image 32).  Left ovary: Cannot be visualized despite transabdominal and transvaginal technique.  The left ovary is visible on the recent comparison.  Other findings: Trace simple appearing pelvic free fluid in the cul- de-sac.  IMPRESSION: 1.  Posterior uterine fibroid again noted up to 5 cm diameter. 2.  Right adnexal 2 cm hypoechoic area with no solid elements identified.  Probable hemorrhagic cyst.  No imaging follow up needed in this age group. 3.  Left adnexa not visible today, was seen on the recent CT abdomen and pelvis. 4.  Trace pelvic free fluid.  Recommendation in #2 follows the Society of Radiologists in Ultrasound 2010 Consensus  Conference Statement (D Lavonda Jumbo al. Management of Asymptomatic Ovarian and Other Adnexal Cysts Imaged at [name]:  Society of Radiologists in Ultrasound Consensus Conference Statement 2010.  Radiology 256 (Sept 2010): 943-954.).   Original Report Authenticated By: Erskine Speed, M.D.      No diagnosis found.    MDM  Vaginal bleeding for the past 3 days of lower abdominal pain. History of uterine fibroids. Vitals stable.  Hemoglobin stable. Orthostatics negative.  Large fibroid seen on pelvic ultrasound. Patient will be referred to  gynecology again. We'll treat pain.  Return precautions discussed including worsening bleeding, dizziness, lightheadedness, chest pain or shortness of breath.     Glynn Octave, MD 06/14/12 [phone]

## 2012-06-22 ENCOUNTER — Emergency Department (HOSPITAL_COMMUNITY): Payer: Self-pay

## 2012-06-22 ENCOUNTER — Emergency Department (HOSPITAL_COMMUNITY)
Admission: EM | Admit: 2012-06-22 | Discharge: 2012-06-22 | Disposition: A | Discharge status: Home / Self Care | Payer: Self-pay | Attending: Emergency Medicine | Admitting: Emergency Medicine

## 2012-06-22 ENCOUNTER — Encounter (HOSPITAL_COMMUNITY): Payer: Self-pay | Admitting: *Deleted

## 2012-06-22 DIAGNOSIS — Z8639 Personal history of other endocrine, nutritional and metabolic disease: Secondary | ICD-10-CM | POA: Insufficient documentation

## 2012-06-22 DIAGNOSIS — R197 Diarrhea, unspecified: Secondary | ICD-10-CM | POA: Insufficient documentation

## 2012-06-22 DIAGNOSIS — J45909 Unspecified asthma, uncomplicated: Secondary | ICD-10-CM | POA: Insufficient documentation

## 2012-06-22 DIAGNOSIS — Z79899 Other long term (current) drug therapy: Secondary | ICD-10-CM | POA: Insufficient documentation

## 2012-06-22 DIAGNOSIS — Z9089 Acquired absence of other organs: Secondary | ICD-10-CM | POA: Insufficient documentation

## 2012-06-22 DIAGNOSIS — Z862 Personal history of diseases of the blood and blood-forming organs and certain disorders involving the immune mechanism: Secondary | ICD-10-CM | POA: Insufficient documentation

## 2012-06-22 DIAGNOSIS — R110 Nausea: Secondary | ICD-10-CM | POA: Insufficient documentation

## 2012-06-22 DIAGNOSIS — N949 Unspecified condition associated with female genital organs and menstrual cycle: Secondary | ICD-10-CM | POA: Insufficient documentation

## 2012-06-22 DIAGNOSIS — R102 Pelvic and perineal pain: Secondary | ICD-10-CM

## 2012-06-22 DIAGNOSIS — Z880 Allergy status to penicillin: Secondary | ICD-10-CM | POA: Insufficient documentation

## 2012-06-22 DIAGNOSIS — Z8742 Personal history of other diseases of the female genital tract: Secondary | ICD-10-CM | POA: Insufficient documentation

## 2012-06-22 DIAGNOSIS — Z9079 Acquired absence of other genital organ(s): Secondary | ICD-10-CM | POA: Insufficient documentation

## 2012-06-22 DIAGNOSIS — F17200 Nicotine dependence, unspecified, uncomplicated: Secondary | ICD-10-CM | POA: Insufficient documentation

## 2012-06-22 LAB — BASIC METABOLIC PANEL
BUN: 7 mg/dL (ref 6–23)
CO2: 23 mEq/L (ref 19–32)
Calcium: 9.1 mg/dL (ref 8.4–10.5)
Chloride: 107 mEq/L (ref 96–112)
Creatinine, Ser: 0.64 mg/dL (ref 0.50–1.10)
GFR calc Af Amer: >90 mL/min (ref >90)
GFR calc non Af Amer: >90 mL/min (ref >90)
Glucose, Bld: 89 mg/dL (ref 70–99)
Potassium: 4.4 mEq/L (ref 3.5–5.1)
Sodium: 140 mEq/L (ref 135–145)

## 2012-06-22 LAB — URINALYSIS, ROUTINE W REFLEX MICROSCOPIC
Bilirubin Urine: NEGATIVE
Glucose, UA: NEGATIVE mg/dL
Hgb urine dipstick: NEGATIVE
Ketones, ur: NEGATIVE mg/dL
Leukocytes, UA: NEGATIVE
Nitrite: NEGATIVE
Protein, ur: NEGATIVE mg/dL
Specific Gravity, Urine: 1.027 (ref 1.005–1.030)
Urobilinogen, UA: 0.2 mg/dL (ref 0.0–1.0)
pH: 5 (ref 5.0–8.0)

## 2012-06-22 LAB — CBC WITH DIFFERENTIAL/PLATELET
Basophils Absolute: 0 10*3/uL (ref 0.0–0.1)
Basophils Relative: 0 % (ref 0–1)
Eosinophils Absolute: 0.1 10*3/uL (ref 0.0–0.7)
Eosinophils Relative: 1 % (ref 0–5)
HCT: 32.2 % — ABNORMAL LOW (ref 36.0–46.0)
Hemoglobin: 10.1 g/dL — ABNORMAL LOW (ref 12.0–15.0)
Lymphocytes Relative: 25 % (ref 12–46)
Lymphs Abs: 1.7 10*3/uL (ref 0.7–4.0)
MCH: 25 pg — ABNORMAL LOW (ref 26.0–34.0)
MCHC: 31.4 g/dL (ref 30.0–36.0)
MCV: 79.7 fL (ref 78.0–100.0)
Monocytes Absolute: 0.6 10*3/uL (ref 0.1–1.0)
Monocytes Relative: 9 % (ref 3–12)
Neutro Abs: 4.5 10*3/uL (ref 1.7–7.7)
Neutrophils Relative %: 65 % (ref 43–77)
Platelets: 606 10*3/uL — ABNORMAL HIGH (ref 150–400)
RBC: 4.04 MIL/uL (ref 3.87–5.11)
RDW: 19.1 % — ABNORMAL HIGH (ref 11.5–15.5)
WBC: 6.9 10*3/uL (ref 4.0–10.5)

## 2012-06-22 LAB — POCT PREGNANCY, URINE: Preg Test, Ur: NEGATIVE

## 2012-06-22 MED ORDER — KETOROLAC TROMETHAMINE 30 MG/ML IJ SOLN
30.0000 mg | Freq: Once | INTRAMUSCULAR | Status: AC
  Administered 2012-06-22: 30 mg via INTRAVENOUS
  Filled 2012-06-22: qty 1

## 2012-06-22 MED ORDER — SODIUM CHLORIDE 0.9 % IV SOLN
INTRAVENOUS | Status: DC
  Administered 2012-06-22: 12:00:00 via INTRAVENOUS

## 2012-06-22 MED ORDER — IOHEXOL 300 MG/ML  SOLN
100.0000 mL | Freq: Once | INTRAMUSCULAR | Status: AC | PRN
  Administered 2012-06-22: 100 mL via INTRAVENOUS

## 2012-06-22 MED ORDER — TRAMADOL HCL 50 MG PO TABS: 50.0000 mg | ORAL_TABLET | Freq: Four times a day (QID) | ORAL | Status: DC | PRN

## 2012-06-22 MED ORDER — ONDANSETRON HCL 4 MG/2ML IJ SOLN
4.0000 mg | Freq: Once | INTRAMUSCULAR | Status: AC
  Administered 2012-06-22: 4 mg via INTRAVENOUS
  Filled 2012-06-22: qty 2

## 2012-06-22 MED ORDER — MORPHINE SULFATE 4 MG/ML IJ SOLN
4.0000 mg | Freq: Once | INTRAMUSCULAR | Status: AC
  Administered 2012-06-22: 4 mg via INTRAVENOUS
  Filled 2012-06-22: qty 1

## 2012-06-22 MED ORDER — IOHEXOL 300 MG/ML  SOLN
50.0000 mL | Freq: Once | INTRAMUSCULAR | Status: AC | PRN
  Administered 2012-06-22: 50 mL via ORAL

## 2012-06-22 MED ORDER — IBUPROFEN 800 MG PO TABS: 800.0000 mg | ORAL_TABLET | Freq: Three times a day (TID) | ORAL | Status: DC

## 2012-06-22 NOTE — ED Notes (Signed)
Patient transported to CT 

## 2012-06-22 NOTE — ED Notes (Signed)
Pt is aware of the need for a urine sample.  

## 2012-06-22 NOTE — ED Notes (Signed)
Patient returned from Ultrasound. 

## 2012-06-22 NOTE — ED Notes (Signed)
Patient transported to Ultrasound 

## 2012-06-22 NOTE — ED Provider Notes (Signed)
History     CSN: [id number]  Arrival date & time 06/22/12  1108   First MD Initiated Contact with Patient 06/22/12 1109      Chief Complaint  Patient presents with  . Abdominal Pain  . Nausea  . Diarrhea    (Consider location/radiation/quality/duration/timing/severity/associated sxs/prior treatment) HPI Comments: Patient comes to the ER for evaluation of abdominal pain. Patient reports that the pain began 3 days ago, but worsened last night. Pain is severe, right lower abdomen mostly. She says it radiates down both of her legs. Pain worsens with movement. She has had nausea but no vomiting. There has been diarrhea. Patient reports a fever of 102 two nights ago, improved with ibuprofen. No fever since. She denies urinary symptoms.  Patient is a 37 y.o. female presenting with abdominal pain and diarrhea.  Abdominal Pain Associated symptoms: diarrhea, fever and nausea   Diarrhea Associated symptoms: abdominal pain and fever     Past Medical History  Diagnosis Date  . Asthma   . EP (ectopic pregnancy)   . Ovarian tumor   . Tumor, thyroid     Past Surgical History  Procedure Laterality Date  . Cholecystectomy  2005  . Oophorectomy  2009    left    Family History  Problem Relation Age of Onset  . Hypertension Mother   . Diabetes Mother   . Cancer Father   . Hyperlipidemia Father   . Hypertension Father     History  Substance Use Topics  . Smoking status: Current Some Day Smoker -- 5 years    Types: Cigarettes  . Smokeless tobacco: Not on file  . Alcohol Use: 0.6 oz/week    1 Cans of beer per week     Comment: one beer daily    OB History   Grav Para Term Preterm Abortions TAB SAB Ect Mult Living   3 1   1  1          Review of Systems  Constitutional: Positive for fever.  Gastrointestinal: Positive for nausea, abdominal pain and diarrhea.  All other systems reviewed and are negative.    Allergies  Penicillins and Shellfish allergy  Home Medications    Current Outpatient Rx  Name  Route  Sig  Dispense  Refill  . CAPSAICIN HOT PATCH EX   Apply externally   Apply 1 patch topically daily as needed (for back pain.).         Marland Kitchen diphenoxylate-atropine (LOMOTIL) 2.5-0.025 MG per tablet   Oral   Take 1 tablet by mouth 4 (four) times daily as needed for diarrhea or loose stools.   30 tablet   0   . ibuprofen (ADVIL,MOTRIN) 200 MG tablet   Oral   Take 200 mg by mouth every 6 (six) hours as needed for pain.         Marland Kitchen ondansetron (ZOFRAN) 4 MG tablet   Oral   Take 1 tablet (4 mg total) by mouth every 6 (six) hours.   12 tablet   0   . oxyCODONE-acetaminophen (PERCOCET/ROXICET) 5-325 MG per tablet   Oral   Take 1 tablet by mouth every 4 (four) hours as needed for pain.   20 tablet   0   . oxyCODONE-acetaminophen (PERCOCET/ROXICET) 5-325 MG per tablet   Oral   Take 2 tablets by mouth every 4 (four) hours as needed for pain.   15 tablet   0   . traMADol (ULTRAM) 50 MG tablet   Oral  Take 1 tablet (50 mg total) by mouth every 6 (six) hours as needed for pain.   15 tablet   0     BP 121/82  Pulse 77  Temp(Src) 98.7 F (37.1 C) (Oral)  Resp 18  SpO2 99%  LMP 05/21/2012  Physical Exam  Constitutional: She is oriented to person, place, and time. She appears well-developed and well-nourished. No distress.  HENT:  Head: Normocephalic and atraumatic.  Right Ear: Hearing normal.  Nose: Nose normal.  Mouth/Throat: Oropharynx is clear and moist and mucous membranes are normal.  Eyes: Conjunctivae and EOM are normal. Pupils are equal, round, and reactive to light.  Neck: Normal range of motion. Neck supple.  Cardiovascular: Normal rate, regular rhythm, S1 normal and S2 normal.  Exam reveals no gallop and no friction rub.   No murmur heard. Pulmonary/Chest: Effort normal and breath sounds normal. No respiratory distress. She exhibits no tenderness.  Abdominal: Soft. Normal appearance and bowel sounds are normal. There is  no hepatosplenomegaly. There is generalized tenderness. There is no rebound, no guarding, no tenderness at McBurney's point and negative Murphy's sign. No hernia.  Diffuse abdominal tenderness without guarding and rebound, but patient is more tender in the right lower quadrant than elsewhere  Musculoskeletal: Normal range of motion.  Neurological: She is alert and oriented to person, place, and time. She has normal strength. No cranial nerve deficit or sensory deficit. Coordination normal. GCS eye subscore is 4. GCS verbal subscore is 5. GCS motor subscore is 6.  Skin: Skin is warm, dry and intact. No rash noted. No cyanosis.  Psychiatric: She has a normal mood and affect. Her speech is normal and behavior is normal. Thought content normal.    ED Course  Procedures (including critical care time)  Labs Reviewed  CBC WITH DIFFERENTIAL - Abnormal; Notable for the following:    Hemoglobin 10.1 (*)    HCT 32.2 (*)    MCH 25.0 (*)    RDW 19.1 (*)    Platelets 606 (*)    All other components within normal limits  BASIC METABOLIC PANEL  URINALYSIS, ROUTINE W REFLEX MICROSCOPIC  POCT PREGNANCY, URINE   Ct Abdomen Pelvis W Contrast  06/22/2012  *RADIOLOGY REPORT*  Clinical Data: Right lower quadrant pain  CT ABDOMEN AND PELVIS WITH CONTRAST  Technique:  Multidetector CT imaging of the abdomen and pelvis was performed following the standard protocol during bolus administration of intravenous contrast.  Contrast: OMNIPAQUE IOHEXOL 300 MG/ML  SOLN  Comparison: 05/18/2012  Findings: Post cholecystectomy.  Diffuse hepatic steatosis.  The spleen, pancreas, adrenal glands, kidneys are within normal limits.  Normal appendix.  A right fundal uterine fibroid is suspected.  Adnexa are within normal limits.  Bladder is decompressed.  No free fluid.  No abnormal adenopathy.  No destructive bone lesion.  IMPRESSION: Normal appendix.  Uterine fibroid.  Pelvic ultrasound confirms this.   Original Report  Authenticated By: Jolaine Click, M.D.      Diagnosis: Abdominal/Pelvic pain    MDM  Patient comes to the ER for evaluation of abdominal pain. Patient reports that the pain has been present for 2 or 3 days. Pain is diffuse and her exam was relatively nonfocal. She had generalized pain in all quadrants, but did have some focality in the right lower abdomen region. For this reason, CT scan was performed. No signs of acute appendicitis noted.  Patient reports persistent pain despite administration of morphine. Because of this, and the fact that the pain  was more pelvic than abdominal, patient was sent for ultrasound with Doppler to rule out torsion. Vision or any acute pathology. Does have evidence of a fibroid tumor.  He should followup with OB/GYN.        Gilda Crease, MD 06/22/12 [phone]

## 2012-06-22 NOTE — ED Notes (Signed)
[id number]> Expected date:<BR> Expected time:<BR> Means of arrival:<BR> Comments:<BR> Abdominal pain

## 2012-06-22 NOTE — ED Notes (Signed)
MD at bedside. 

## 2012-06-22 NOTE — ED Notes (Signed)
Per EMS, pt from home with reports of RLQ pain radiating down R leg with numbness in R foot and toes as well as nausea and diarrhea that started yesterday evening at 1900. EMS reports that pt was seen for lower abdominal pain here about a month ago being diagnosed with ovarian cyst.

## 2012-07-30 ENCOUNTER — Ambulatory Visit: Payer: No Typology Code available for payment source | Attending: Family Medicine | Admitting: Family Medicine

## 2012-07-30 ENCOUNTER — Encounter (HOSPITAL_COMMUNITY): Payer: Self-pay | Admitting: Emergency Medicine

## 2012-07-30 ENCOUNTER — Emergency Department (HOSPITAL_COMMUNITY)
Admission: EM | Admit: 2012-07-30 | Discharge: 2012-07-30 | Disposition: A | Discharge status: Home / Self Care | Payer: Self-pay | Attending: Emergency Medicine | Admitting: Emergency Medicine

## 2012-07-30 VITALS — BP 132/92 | HR 96 | Temp 98.3°F | Resp 16 | Wt 220.8 lb

## 2012-07-30 DIAGNOSIS — Z3202 Encounter for pregnancy test, result negative: Secondary | ICD-10-CM | POA: Insufficient documentation

## 2012-07-30 DIAGNOSIS — Z8542 Personal history of malignant neoplasm of other parts of uterus: Secondary | ICD-10-CM | POA: Insufficient documentation

## 2012-07-30 DIAGNOSIS — J45909 Unspecified asthma, uncomplicated: Secondary | ICD-10-CM | POA: Insufficient documentation

## 2012-07-30 DIAGNOSIS — R197 Diarrhea, unspecified: Secondary | ICD-10-CM | POA: Insufficient documentation

## 2012-07-30 DIAGNOSIS — Z8742 Personal history of other diseases of the female genital tract: Secondary | ICD-10-CM | POA: Insufficient documentation

## 2012-07-30 DIAGNOSIS — N920 Excessive and frequent menstruation with regular cycle: Secondary | ICD-10-CM

## 2012-07-30 DIAGNOSIS — Z9089 Acquired absence of other organs: Secondary | ICD-10-CM | POA: Insufficient documentation

## 2012-07-30 DIAGNOSIS — G8929 Other chronic pain: Secondary | ICD-10-CM

## 2012-07-30 DIAGNOSIS — R3589 Other polyuria: Secondary | ICD-10-CM

## 2012-07-30 DIAGNOSIS — Z9079 Acquired absence of other genital organ(s): Secondary | ICD-10-CM | POA: Insufficient documentation

## 2012-07-30 DIAGNOSIS — D649 Anemia, unspecified: Secondary | ICD-10-CM | POA: Insufficient documentation

## 2012-07-30 DIAGNOSIS — R102 Pelvic and perineal pain: Secondary | ICD-10-CM

## 2012-07-30 DIAGNOSIS — N949 Unspecified condition associated with female genital organs and menstrual cycle: Secondary | ICD-10-CM | POA: Insufficient documentation

## 2012-07-30 DIAGNOSIS — R358 Other polyuria: Secondary | ICD-10-CM

## 2012-07-30 DIAGNOSIS — R109 Unspecified abdominal pain: Secondary | ICD-10-CM | POA: Insufficient documentation

## 2012-07-30 DIAGNOSIS — F17200 Nicotine dependence, unspecified, uncomplicated: Secondary | ICD-10-CM | POA: Insufficient documentation

## 2012-07-30 DIAGNOSIS — K920 Hematemesis: Secondary | ICD-10-CM | POA: Insufficient documentation

## 2012-07-30 DIAGNOSIS — Z8639 Personal history of other endocrine, nutritional and metabolic disease: Secondary | ICD-10-CM | POA: Insufficient documentation

## 2012-07-30 DIAGNOSIS — D259 Leiomyoma of uterus, unspecified: Secondary | ICD-10-CM | POA: Insufficient documentation

## 2012-07-30 DIAGNOSIS — R32 Unspecified urinary incontinence: Secondary | ICD-10-CM

## 2012-07-30 DIAGNOSIS — Z862 Personal history of diseases of the blood and blood-forming organs and certain disorders involving the immune mechanism: Secondary | ICD-10-CM | POA: Insufficient documentation

## 2012-07-30 DIAGNOSIS — Z880 Allergy status to penicillin: Secondary | ICD-10-CM | POA: Insufficient documentation

## 2012-07-30 DIAGNOSIS — R112 Nausea with vomiting, unspecified: Secondary | ICD-10-CM | POA: Insufficient documentation

## 2012-07-30 LAB — COMPREHENSIVE METABOLIC PANEL
ALT: 46 U/L — ABNORMAL HIGH (ref 0–35)
AST: 91 U/L — ABNORMAL HIGH (ref 0–37)
Albumin: 3.2 g/dL — ABNORMAL LOW (ref 3.5–5.2)
Alkaline Phosphatase: 74 U/L (ref 39–117)
BUN: 6 mg/dL (ref 6–23)
CO2: 20 mEq/L (ref 19–32)
Calcium: 8.5 mg/dL (ref 8.4–10.5)
Chloride: 105 mEq/L (ref 96–112)
Creatinine, Ser: 0.72 mg/dL (ref 0.50–1.10)
GFR calc Af Amer: >90 mL/min (ref >90)
GFR calc non Af Amer: >90 mL/min (ref >90)
Glucose, Bld: 119 mg/dL — ABNORMAL HIGH (ref 70–99)
Potassium: 3.5 mEq/L (ref 3.5–5.1)
Sodium: 136 mEq/L (ref 135–145)
Total Bilirubin: 0.2 mg/dL — ABNORMAL LOW (ref 0.3–1.2)
Total Protein: 7.5 g/dL (ref 6.0–8.3)

## 2012-07-30 LAB — URINALYSIS, ROUTINE W REFLEX MICROSCOPIC
Glucose, UA: NEGATIVE mg/dL
Hgb urine dipstick: NEGATIVE
Ketones, ur: NEGATIVE mg/dL
Leukocytes, UA: NEGATIVE
Nitrite: NEGATIVE
Protein, ur: 30 mg/dL — AB
Specific Gravity, Urine: 1.037 — ABNORMAL HIGH (ref 1.005–1.030)
Urobilinogen, UA: 0.2 mg/dL (ref 0.0–1.0)
pH: 5.5 (ref 5.0–8.0)

## 2012-07-30 LAB — CBC WITH DIFFERENTIAL/PLATELET
Basophils Absolute: 0.1 10*3/uL (ref 0.0–0.1)
Basophils Relative: 1 % (ref 0–1)
Eosinophils Absolute: 0.1 10*3/uL (ref 0.0–0.7)
Eosinophils Relative: 2 % (ref 0–5)
HCT: 33 % — ABNORMAL LOW (ref 36.0–46.0)
Hemoglobin: 10.7 g/dL — ABNORMAL LOW (ref 12.0–15.0)
Lymphocytes Relative: 42 % (ref 12–46)
Lymphs Abs: 2.8 10*3/uL (ref 0.7–4.0)
MCH: 26.5 pg (ref 26.0–34.0)
MCHC: 32.4 g/dL (ref 30.0–36.0)
MCV: 81.7 fL (ref 78.0–100.0)
Monocytes Absolute: 0.7 10*3/uL (ref 0.1–1.0)
Monocytes Relative: 10 % (ref 3–12)
Neutro Abs: 3.1 10*3/uL (ref 1.7–7.7)
Neutrophils Relative %: 46 % (ref 43–77)
Platelets: 496 10*3/uL — ABNORMAL HIGH (ref 150–400)
RBC: 4.04 MIL/uL (ref 3.87–5.11)
RDW: 18.8 % — ABNORMAL HIGH (ref 11.5–15.5)
WBC: 6.8 10*3/uL (ref 4.0–10.5)

## 2012-07-30 LAB — POCT URINALYSIS DIPSTICK
Bilirubin, UA: NEGATIVE
Blood, UA: NEGATIVE
Glucose, UA: NEGATIVE
Ketones, UA: NEGATIVE
Leukocytes, UA: NEGATIVE
Nitrite, UA: NEGATIVE
Spec Grav, UA: >=1.03
Urobilinogen, UA: 0.2
pH, UA: 5.5

## 2012-07-30 LAB — PREGNANCY, URINE: Preg Test, Ur: NEGATIVE

## 2012-07-30 LAB — URINE MICROSCOPIC-ADD ON

## 2012-07-30 LAB — LIPASE, BLOOD: Lipase: 24 U/L (ref 11–59)

## 2012-07-30 LAB — POCT URINE PREGNANCY: Preg Test, Ur: NEGATIVE

## 2012-07-30 MED ORDER — ONDANSETRON HCL 4 MG/2ML IJ SOLN
4.0000 mg | Freq: Once | INTRAMUSCULAR | Status: AC
  Administered 2012-07-30: 4 mg via INTRAVENOUS
  Filled 2012-07-30: qty 2

## 2012-07-30 MED ORDER — HYDROMORPHONE HCL PF 1 MG/ML IJ SOLN
1.0000 mg | Freq: Once | INTRAMUSCULAR | Status: AC
  Administered 2012-07-30: 1 mg via INTRAVENOUS
  Filled 2012-07-30: qty 1

## 2012-07-30 MED ORDER — HYDROCODONE-ACETAMINOPHEN 5-325 MG PO TABS: 1.0000 | ORAL_TABLET | Freq: Four times a day (QID) | ORAL | Status: DC | PRN

## 2012-07-30 MED ORDER — PANTOPRAZOLE SODIUM 40 MG PO TBEC: 40.0000 mg | DELAYED_RELEASE_TABLET | Freq: Every day | ORAL | Status: DC

## 2012-07-30 MED ORDER — IBUPROFEN 800 MG PO TABS: 800.0000 mg | ORAL_TABLET | Freq: Three times a day (TID) | ORAL | Status: DC | PRN

## 2012-07-30 MED ORDER — LORAZEPAM 2 MG/ML IJ SOLN
1.0000 mg | Freq: Once | INTRAMUSCULAR | Status: AC
  Administered 2012-07-30: 1 mg via INTRAVENOUS
  Filled 2012-07-30: qty 1

## 2012-07-30 MED ORDER — ALBUTEROL SULFATE HFA 108 (90 BASE) MCG/ACT IN AERS: 2.0000 | INHALATION_SPRAY | Freq: Four times a day (QID) | RESPIRATORY_TRACT | Status: DC | PRN

## 2012-07-30 MED ORDER — IPRATROPIUM-ALBUTEROL 0.5-2.5 (3) MG/3ML IN SOLN
3.0000 mL | Freq: Once | RESPIRATORY_TRACT | Status: AC
  Administered 2012-07-30: 3 mL via RESPIRATORY_TRACT

## 2012-07-30 MED ORDER — SODIUM CHLORIDE 0.9 % IV BOLUS (SEPSIS)
1000.0000 mL | Freq: Once | INTRAVENOUS | Status: AC
  Administered 2012-07-30: 1000 mL via INTRAVENOUS

## 2012-07-30 MED ORDER — PANTOPRAZOLE SODIUM 40 MG IV SOLR
40.0000 mg | Freq: Once | INTRAVENOUS | Status: AC
  Administered 2012-07-30: 40 mg via INTRAVENOUS
  Filled 2012-07-30: qty 40

## 2012-07-30 NOTE — Patient Instructions (Addendum)
Asthma Attack Prevention HOW CAN ASTHMA BE PREVENTED? Currently, there is no way to prevent asthma from starting. However, you can take steps to control the disease and prevent its symptoms after you have been diagnosed. Learn about your asthma and how to control it. Take an active role to control your asthma by working with your caregiver to create and follow an asthma action plan. An asthma action plan guides you in taking your medicines properly, avoiding factors that make your asthma worse, tracking your level of asthma control, responding to worsening asthma, and seeking emergency care when needed. To track your asthma, keep records of your symptoms, check your peak flow number using a peak flow meter (handheld device that shows how well air moves out of your lungs), and get regular asthma checkups.  Other ways to prevent asthma attacks include:  Use medicines as your caregiver directs.  Identify and avoid things that make your asthma worse (as much as you can).  Keep track of your asthma symptoms and level of control.  Get regular checkups for your asthma.  With your caregiver, write a detailed plan for taking medicines and managing an asthma attack. Then be sure to follow your action plan. Asthma is an ongoing condition that needs regular monitoring and treatment.  Identify and avoid asthma triggers. A number of outdoor allergens and irritants (pollen, mold, cold air, air pollution) can trigger asthma attacks. Find out what causes or makes your asthma worse, and take steps to avoid those triggers.  Monitor your breathing. Learn to recognize warning signs of an attack, such as slight coughing, wheezing or shortness of breath. However, your lung function may already decrease before you notice any signs or symptoms, so regularly measure and record your peak airflow with a home peak flow meter.  Identify and treat attacks early. If you act quickly, you're less likely to have a severe attack.  You will also need less medicine to control your symptoms. When your peak flow measurements decrease and alert you to an upcoming attack, take your medicine as instructed, and immediately stop any activity that may have triggered the attack. If your symptoms do not improve, get medical help.  Pay attention to increasing quick-relief inhaler use. If you find yourself relying on your quick-relief inhaler (such as albuterol), your asthma is not under control. See your caregiver about adjusting your treatment. IDENTIFY AND CONTROL FACTORS THAT MAKE YOUR ASTHMA WORSE A number of common things can set off or make your asthma symptoms worse (asthma triggers). Keep track of your asthma symptoms for several weeks, detailing all the environmental and emotional factors that are linked with your asthma. When you have an asthma attack, go back to your asthma diary to see which factor, or combination of factors, might have contributed to it. Once you know what these factors are, you can take steps to control many of them.  Allergies: If you have allergies and asthma, it is important to take asthma prevention steps at home. Asthma attacks (worsening of asthma symptoms) can be triggered by allergies, which can cause temporary increased inflammation of your airways. Minimizing contact with the substance to which you are allergic will help prevent an asthma attack. Animal Dander:   Some people are allergic to the flakes of skin or dried saliva from animals with fur or feathers. Keep these pets out of your home.  If you can't keep a pet outdoors, keep the pet out of your bedroom and other sleeping areas at all times,  and keep the door closed.  Remove carpets and furniture covered with cloth from your home. If that is not possible, keep the pet away from fabric-covered furniture and carpets. Dust Mites:  Many people with asthma are allergic to dust mites. Dust mites are tiny bugs that are found in every home, in  mattresses, pillows, carpets, fabric-covered furniture, bedcovers, clothes, stuffed toys, fabric, and other fabric-covered items.  Cover your mattress in a special dust-proof cover.  Cover your pillow in a special dust-proof cover, or wash the pillow each week in hot water. Water must be hotter than 130 F to kill dust mites. Cold or warm water used with detergent and bleach can also be effective.  Wash the sheets and blankets on your bed each week in hot water.  Try not to sleep or lie on cloth-covered cushions.  Call ahead when traveling and ask for a smoke-free hotel room. Bring your own bedding and pillows, in case the hotel only supplies feather pillows and down comforters, which may contain dust mites and cause asthma symptoms.  Remove carpets from your bedroom and those laid on concrete, if you can.  Keep stuffed toys out of the bed, or wash the toys weekly in hot water or cooler water with detergent and bleach. Cockroaches:  Many people with asthma are allergic to the droppings and remains of cockroaches.  Keep food and garbage in closed containers. Never leave food out.  Use poison baits, traps, powders, gels, or paste (for example, boric acid).  If a spray is used to kill cockroaches, stay out of the room until the odor goes away. Indoor Mold:  Fix leaky faucets, pipes, or other sources of water that have mold around them.  Clean floors and moldy surfaces with a fungicide or diluted bleach.  Avoid using humidifiers, vaporizers, or swamp coolers. These can spread molds through the air. Pollen and Outdoor Mold:  When pollen or mold spore counts are high, try to keep your windows closed.  Stay indoors with windows closed from late morning to afternoon, if you can. Pollen and some mold spore counts are highest at that time.  Ask your caregiver whether you need to take or increase anti-inflammatory medicine before your allergy season starts. Irritants:   Tobacco smoke is  an irritant. If you smoke, ask your caregiver how you can quit. Ask family members to quit smoking, too. Do not allow smoking in your home or car.  If possible, do not use a wood-burning stove, kerosene heater, or fireplace. Minimize exposure to all sources of smoke, including incense, candles, fires, and fireworks.  Try to stay away from strong odors and sprays, such as perfume, talcum powder, hair spray, and paints.  Decrease humidity in your home and use an indoor air cleaning device. Reduce indoor humidity to below 60 percent. Dehumidifiers or central air conditioners can do this.  Decrease house dust exposure by changing furnace and air cooler filters frequently.  Try to have someone else vacuum for you once or twice a week, if you can. Stay out of rooms while they are being vacuumed and for a short while afterward.  If you vacuum, use a dust mask from a hardware store, a double-layered or microfilter vacuum cleaner bag, or a vacuum cleaner with a HEPA filter.  Sulfites in foods and beverages can be irritants. Do not drink beer or wine, or eat dried fruit, processed potatoes, or shrimp if they cause asthma symptoms.  Cold air can trigger an asthma attack.  Cover your nose and mouth with a scarf on cold or windy days.  Several health conditions can make asthma more difficult to manage, including runny nose, sinus infections, reflux disease, psychological stress, and sleep apnea. Your caregiver will treat these conditions, as well.  Avoid close contact with people who have a cold or the flu, since your asthma symptoms may get worse if you catch the infection from them. Wash your hands thoroughly after touching items that may have been handled by people with a respiratory infection.  Get a flu shot every year to protect against the flu virus, which often makes asthma worse for days or weeks. Also get a pneumonia shot once every 5 10 years. Medicines:  Aspirin and other pain relievers can  cause asthma attacks. Ten percent to 20% of people with asthma have sensitivity to aspirin or a group of pain relievers called non-steroidal anti-inflammatory medicines (NSAIDS), such as ibuprofen and naproxen. These medicines are used to treat pain and reduce fevers. Asthma attacks caused by any of these medicines can be severe and even fatal. These medicines must be avoided in people who have known aspirin sensitive asthma. Products with acetaminophen are considered safe for people who have asthma. It is important that people with aspirin sensitivity read labels of all over-the-counter medicines used to treat pain, colds, coughs, and fever.  Beta blockers and ACE inhibitors are other medicines which you should discuss with your caregiver, in relation to your asthma. ALLERGY SKIN TESTING  Ask your asthma caregiver about allergy skin testing or blood testing (RAST test) to identify the allergens to which you are sensitive. If you are found to have allergies, allergy shots (immunotherapy) for asthma may help prevent future allergies and asthma. With allergy shots, small doses of allergens (substances to which you are allergic) are injected under your skin on a regular schedule. Over a period of time, your body may become used to the allergen and less responsive with asthma symptoms. You can also take measures to minimize your exposure to those allergens. EXERCISE  If you have exercise-induced asthma, or are planning vigorous exercise, or exercise in cold, humid, or dry environments, prevent exercise-induced asthma by following your caregiver's advice regarding asthma treatment before exercising. Document Released: 01/22/2009 Document Revised: 01/21/2012 Document Reviewed: 01/22/2009 [facility] Patient Information 2014 [employer], [place]. Pelvic Pain Pelvic pain is pain below the belly button and located between your hips. Acute pain may last a few hours or days. Chronic pelvic pain may last weeks and months. The  cause may be different for different types of pain. The pain may be dull or sharp, mild or severe and can interfere with your daily activities. Write down and tell your caregiver:   Exactly where the pain is located.  If it comes and goes or is there all the time.  When it happens (with sex, urination, bowel movement, etc.)  If the pain is related to your menstrual period or stress. Your caregiver will take a full history and do a complete physical exam and Pap test. CAUSES   Painful menstrual periods (dysmenorrhea).  Normal ovulation (Mittelschmertz) that occurs in the middle of the menstrual cycle every month.  The pelvic organs get engorged with blood just before the menstrual period (pelvic congestive syndrome).  Scar tissue from an infection or past surgery (pelvic adhesions).  Cancer of the female pelvic organs. When there is pain with cancer, it has been there for a long time.  The lining of the uterus (endometrium) abnormally  grows in places like the pelvis and on the pelvic organs (endometriosis).  A form of endometriosis with the lining of the uterus present inside of the muscle tissue of the uterus (adenomyosis).  Fibroid tumor (noncancerous) in the uterus.  Bladder problems such as infection, bladder spasms of the muscle tissue of the bladder.  Intestinal problems (irritable bowel syndrome, colitis, an ulcer or gastrointestinal infection).  Polyps of the cervix or uterus.  Pregnancy in the tube (ectopic pregnancy).  The opening of the cervix is too small for the menstrual blood to flow through it (cervical stenosis).  Physical or sexual abuse (past or present).  Musculo-skeletal problems from poor posture, problems with the vertebrae of the lower back or the uterine pelvic muscles falling (prolapse).  Psychological problems such as depression or stress.  IUD (intrauterine device) in the uterus. DIAGNOSIS  Tests to make a diagnosis depends on the type,  location, severity and what causes the pain to occur. Tests that may be needed include:  Blood tests.  Urine tests  Ultrasound.  X-rays.  CT Scan.  MRI.  Laparoscopy.  Major surgery. TREATMENT  Treatment will depend on the cause of the pain, which includes:  Prescription or over-the-counter pain medication.  Antibiotics.  Birth control pills.  Hormone treatment.  Nerve blocking injections.  Physical therapy.  Antidepressants.  Counseling with a psychiatrist or psychologist.  Minor or major surgery. HOME CARE INSTRUCTIONS   Only take over-the-counter or prescription medicines for pain, discomfort or fever as directed by your caregiver.  Follow your caregiver's advice to treat your pain.  Rest.  Avoid sexual intercourse if it causes the pain.  Apply warm or cold compresses (which ever works best) to the pain area.  Do relaxation exercises such as yoga or meditation.  Try acupuncture.  Avoid stressful situations.  Try group therapy.  If the pain is because of a stomach/intestinal upset, drink clear liquids, eat a bland light food diet until the symptoms go away. SEEK MEDICAL CARE IF:   You need stronger prescription pain medication.  You develop pain with sexual intercourse.  You have pain with urination.  You develop a temperature of 102 F (38.9 C) with the pain.  You are still in pain after 4 hours of taking prescription medication for the pain.  You need depression medication.  Your IUD is causing pain and you want it removed. SEEK IMMEDIATE MEDICAL CARE IF:  You develop very severe pain or tenderness.  You faint, have chills, severe weakness or dehydration.  You develop heavy vaginal bleeding or passing solid tissue.  You develop a temperature of 102 F (38.9 C) with the pain.  You have blood in the urine.  You are being physically or sexually abused.  You have uncontrolled vomiting and diarrhea.  You are depressed and  afraid of harming yourself or someone else. Document Released: 03/13/2004 Document Revised: 04/28/2011 Document Reviewed: 12/09/2007 [facility] Patient Information 2013 [place], [place]. Fibroids Fibroids are lumps (tumors) that can occur any place in a woman's body. These lumps are not cancerous. Fibroids vary in size, weight, and where they grow. HOME CARE  Do not take aspirin.  Write down the number of pads or tampons you use during your period. Tell your doctor. This can help determine the best treatment for you. GET HELP RIGHT AWAY IF:  You have pain in your lower belly (abdomen) that is not helped with medicine.  You have cramps that are not helped with medicine.  You have more bleeding  between or during your period.  You feel lightheaded or pass out (faint).  Your lower belly pain gets worse. MAKE SURE YOU:  Understand these instructions.  Will watch your condition.  Will get help right away if you are not doing well or get worse. Document Released: 03/08/2010 Document Revised: 04/28/2011 Document Reviewed: 03/08/2010 [facility] Patient Information 2014 [place], [place]. Anemia, Frequently Asked Questions WHAT ARE THE SYMPTOMS OF ANEMIA?  Headache.  Difficulty thinking.  Fatigue.  Shortness of breath.  Weakness.  Rapid heartbeat. AT WHAT POINT ARE PEOPLE CONSIDERED ANEMIC?  This varies with gender and age.   Both hemoglobin (Hgb) and hematocrit values are used to define anemia. These lab values are obtained from a complete blood count (CBC) test. This is performed at a caregiver's office.  The normal range of hemoglobin values for adult men is 14.0 g/dL to 62.1 g/dL. For nonpregnant women, values are 12.3 g/dL to 30.8 g/dL.  The World Health Organization defines anemia as less than 12 g/dL for nonpregnant women and less than 13 g/dL for men.  For adult males, the average normal hematocrit is 46%, and the range is 40% to 52%.  For adult females, the average  normal hematocrit is 41%, and the range is 35% to 47%.  Values that fall below the lower limits can be a sign of anemia and should have further checking (evaluation). GROUPS OF PEOPLE WHO ARE AT RISK FOR DEVELOPING ANEMIA INCLUDE:   Infants who are breastfed or taking a formula that is not fortified with iron.  Children going through a rapid growth spurt. The iron available can not keep up with the needs for a red cell mass which must grow with the child.  Women in childbearing years. They need iron because of blood loss during menstruation.  Pregnant women. The growing fetus creates a high demand for iron.  People with ongoing gastrointestinal blood loss are at risk of developing iron deficiency.  Individuals with leukemia or cancer who must receive chemotherapy or radiation to treat their disease. The drugs or radiation used to treat these diseases often decreases the bone marrow's ability to make cells of all classes. This includes red blood cells, white blood cells, and platelets.  Individuals with chronic inflammatory conditions such as rheumatoid arthritis or chronic infections.  The elderly. ARE SOME TYPES OF ANEMIA INHERITED?   Yes, some types of anemia are due to inherited or genetic defects.  Sickle cell anemia. This occurs most often in people of African, African American, and Mediterranean descent.  Thalassemia (or Cooley's anemia). This type is found in people of Mediterranean and Southeast Asian descent. These types of anemia are common.  [place]. This is rare. CAN CERTAIN MEDICATIONS CAUSE A PERSON TO BECOME ANEMIC?  Yes. For example, drugs to fight cancer (chemotherapeutic agents) often cause anemia. These drugs can slow the bone marrow's ability to make red blood cells. If there are not enough red blood cells, the body does not get enough oxygen. WHAT HEMATOCRIT LEVEL IS REQUIRED TO DONATE BLOOD?  The lower limit of an acceptable hematocrit for blood donors is 38%. If  you have a low hematocrit value, you should schedule an appointment with your caregiver. ARE BLOOD TRANSFUSIONS COMMONLY USED TO CORRECT ANEMIA, AND ARE THEY DANGEROUS?  They are used to treat anemia as a last resort. Your caregiver will find the cause of the anemia and correct it if possible. Most blood transfusions are given because of excessive bleeding at the time of surgery, with  trauma, or because of bone marrow suppression in patients with cancer or leukemia on chemotherapy. Blood transfusions are safer than ever before. We also know that blood transfusions affect the immune system and may increase certain risks. There is also a concern for human error. In 1/16,000 transfusions, a patient receives a transfusion of blood that is not matched with his or her blood type.  WHAT IS IRON DEFICIENCY ANEMIA AND CAN I CORRECT IT BY CHANGING MY DIET?  Iron is an essential part of hemoglobin. Without enough hemoglobin, anemia develops and the body does not get the right amount of oxygen. Iron deficiency anemia develops after the body has had a low level of iron for a long time. This is either caused by blood loss, not taking in or absorbing enough iron, or increased demands for iron (like pregnancy or rapid growth).  Foods from animal origin such as beef, chicken, and pork, are good sources of iron. Be sure to have one of these foods at each meal. Vitamin C helps your body absorb iron. Foods rich in Vitamin C include citrus, bell pepper, strawberries, spinach and cantaloupe. In some cases, iron supplements may be needed in order to correct the iron deficiency. In the case of poor absorption, extra iron may have to be given directly into the vein through a needle (intravenously). I HAVE BEEN DIAGNOSED WITH IRON DEFICIENCY ANEMIA AND MY CAREGIVER PRESCRIBED IRON SUPPLEMENTS. HOW LONG WILL IT TAKE FOR MY BLOOD TO BECOME NORMAL?  It depends on the degree of anemia at the beginning of treatment. Most people with mild  to moderate iron deficiency, anemia will correct the anemia over a period of 2 to 3 months. But after the anemia is corrected, the iron stored by the body is still low. Caregivers often suggest an additional 6 months of oral iron therapy once the anemia has been reversed. This will help prevent the iron deficiency anemia from quickly happening again. Non-anemic adult males should take iron supplements only under the direction of a doctor, too much iron can cause liver damage.  MY HEMOGLOBIN IS 9 G/DL AND I AM SCHEDULED FOR SURGERY. SHOULD I POSTPONE THE SURGERY?  If you have Hgb of 9, you should discuss this with your caregiver right away. Many patients with similar hemoglobin levels have had surgery without problems. If minimal blood loss is expected for a minor procedure, no treatment may be necessary.  If a greater blood loss is expected for more extensive procedures, you should ask your caregiver about being treated with erythropoietin and iron. This is to accelerate the recovery of your hemoglobin to a normal level before surgery. An anemic patient who undergoes high-blood-loss surgery has a greater risk of surgical complications and need for a blood transfusion, which also carries some risk.  I HAVE BEEN TOLD THAT HEAVY MENSTRUAL PERIODS CAUSE ANEMIA. IS THERE ANYTHING I CAN DO TO PREVENT THE ANEMIA?  Anemia that results from heavy periods is usually due to iron deficiency. You can try to meet the increased demands for iron caused by the heavy monthly blood loss by increasing the intake of iron-rich foods. Iron supplements may be required. Discuss your concerns with your caregiver. WHAT CAUSES ANEMIA DURING PREGNANCY?  Pregnancy places major demands on the body. The mother must meet the needs of both her body and her growing baby. The body needs enough iron and folate to make the right amount of red blood cells. To prevent anemia while pregnant, the mother should stay in close  contact with her  caregiver.  Be sure to eat a diet that has foods rich in iron and folate like liver and dark green leafy vegetables. Folate plays an important role in the normal development of a baby's spinal cord. Folate can help prevent serious disorders like spina bifida. If your diet does not provide adequate nutrients, you may want to talk with your caregiver about nutritional supplements.  WHAT IS THE RELATIONSHIP BETWEEN FIBROID TUMORS AND ANEMIA IN WOMEN?  The relationship is usually caused by the increased menstrual blood loss caused by fibroids. Good iron intake may be required to prevent iron deficiency anemia from developing.  Document Released: 09/12/2003 Document Revised: 04/28/2011 Document Reviewed: 02/26/2010 [facility] Patient Information 2014 [place], [place]. Smoking Cessation, Tips for Success YOU CAN QUIT SMOKING If you are ready to quit smoking, congratulations! You have chosen to help yourself be healthier. Cigarettes bring nicotine, tar, carbon monoxide, and other irritants into your body. Your lungs, heart, and blood vessels will be able to work better without these poisons. There are many different ways to quit smoking. Nicotine gum, nicotine patches, a nicotine inhaler, or nicotine nasal spray can help with physical craving. Hypnosis, support groups, and medicines help break the habit of smoking. Here are some tips to help you quit for good.  Throw away all cigarettes.  Clean and remove all ashtrays from your home, work, and car.  On a card, write down your reasons for quitting. Carry the card with you and read it when you get the urge to smoke.  Cleanse your body of nicotine. Drink enough water and fluids to keep your urine clear or pale yellow. Do this after quitting to flush the nicotine from your body.  Learn to predict your moods. Do not let a bad situation be your excuse to have a cigarette. Some situations in your life might tempt you into wanting a cigarette.  Never have "just  one" cigarette. It leads to wanting another and another. Remind yourself of your decision to quit.  Change habits associated with smoking. If you smoked while driving or when feeling stressed, try other activities to replace smoking. Stand up when drinking your coffee. Brush your teeth after eating. Sit in a different chair when you read the paper. Avoid alcohol while trying to quit, and try to drink fewer caffeinated beverages. Alcohol and caffeine may urge you to smoke.  Avoid foods and drinks that can trigger a desire to smoke, such as sugary or spicy foods and alcohol.  Ask people who smoke not to smoke around you.  Have something planned to do right after eating or having a cup of coffee. Take a walk or exercise to perk you up. This will help to keep you from overeating.  Try a relaxation exercise to calm you down and decrease your stress. Remember, you may be tense and nervous for the first 2 weeks after you quit, but this will pass.  Find new activities to keep your hands busy. Play with a pen, coin, or rubber band. Doodle or draw things on paper.  Brush your teeth right after eating. This will help cut down on the craving for the taste of tobacco after meals. You can try mouthwash, too.  Use oral substitutes, such as lemon drops, carrots, a cinnamon stick, or chewing gum, in place of cigarettes. Keep them handy so they are available when you have the urge to smoke.  When you have the urge to smoke, try deep breathing.  Designate your  home as a nonsmoking area.  If you are a heavy smoker, ask your caregiver about a prescription for nicotine chewing gum. It can ease your withdrawal from nicotine.  Reward yourself. Set aside the cigarette money you save and buy yourself something nice.  Look for support from others. Join a support group or smoking cessation program. Ask someone at home or at work to help you with your plan to quit smoking.  Always ask yourself, "Do I need this  cigarette or is this just a reflex?" Tell yourself, "Today, I choose not to smoke," or "I do not want to smoke." You are reminding yourself of your decision to quit, even if you do smoke a cigarette. HOW WILL I FEEL WHEN I QUIT SMOKING?  The benefits of not smoking start within days of quitting.  You may have symptoms of withdrawal because your body is used to nicotine (the addictive substance in cigarettes). You may crave cigarettes, be irritable, feel very hungry, cough often, get headaches, or have difficulty concentrating.  The withdrawal symptoms are only temporary. They are strongest when you first quit but will go away within 10 to 14 days.  When withdrawal symptoms occur, stay in control. Think about your reasons for quitting. Remind yourself that these are signs that your body is healing and getting used to being without cigarettes.  Remember that withdrawal symptoms are easier to treat than the major diseases that smoking can cause.  Even after the withdrawal is over, expect periodic urges to smoke. However, these cravings are generally short-lived and will go away whether you smoke or not. Do not smoke!  If you relapse and smoke again, do not lose hope. Most smokers quit 3 times before they are successful.  If you relapse, do not give up! Plan ahead and think about what you will do the next time you get the urge to smoke. LIFE AS A NONSMOKER: MAKE IT FOR A MONTH, MAKE IT FOR LIFE Day 1: Hang this page where you will see it every day. Day 2: Get rid of all ashtrays, matches, and lighters. Day 3: Drink water. Breathe deeply between sips. Day 4: Avoid places with smoke-filled air, such as bars, clubs, or the smoking section of restaurants. Day 5: Keep track of how much money you save by not smoking. Day 6: Avoid boredom. Keep a good book with you or go to the movies. Day 7: Reward yourself! One week without smoking! Day 8: Make a dental appointment to get your teeth cleaned. Day 9:  Decide how you will turn down a cigarette before it is offered to you. Day 10: Review your reasons for quitting. Day 11: Distract yourself. Stay active to keep your mind off smoking and to relieve tension. Take a walk, exercise, read a book, do a crossword puzzle, or try a new hobby. Day 12: Exercise. Get off the bus before your stop or use stairs instead of escalators. Day 13: Call on friends for support and encouragement. Day 14: Reward yourself! Two weeks without smoking! Day 15: Practice deep breathing exercises. Day 16: Bet a friend that you can stay a nonsmoker. Day 17: Ask to sit in nonsmoking sections of restaurants. Day 18: Hang up "No Smoking" signs. Day 19: Think of yourself as a nonsmoker. Day 20: Each morning, tell yourself you will not smoke. Day 21: Reward yourself! Three weeks without smoking! Day 22: Think of smoking in negative ways. Remember how it stains your teeth, gives you bad breath, and leaves  you short of breath. Day 23: Eat a nutritious breakfast. Day 24:Do not relive your days as a smoker. Day 25: Hold a pencil in your hand when talking on the telephone. Day 26: Tell all your friends you do not smoke. Day 27: Think about how much better food tastes. Day 28: Remember, one cigarette is one too many. Day 29: Take up a hobby that will keep your hands busy. Day 30: Congratulations! One month without smoking! Give yourself a big reward. Your caregiver can direct you to community resources or hospitals for support, which may include:  Group support.  Education.  Hypnosis.  Subliminal therapy. Document Released: 11/02/2003 Document Revised: 04/28/2011 Document Reviewed: 11/20/2008 [facility] Patient Information 2014 [place], [place]. Smoking Cessation Quitting smoking is important to your health and has many advantages. However, it is not always easy to quit since nicotine is a very addictive drug. Often times, people try 3 times or more before being able to quit.  This document explains the best ways for you to prepare to quit smoking. Quitting takes hard work and a lot of effort, but you can do it. ADVANTAGES OF QUITTING SMOKING  You will live longer, feel better, and live better.  Your body will feel the impact of quitting smoking almost immediately.  Within 20 minutes, blood pressure decreases. Your pulse returns to its normal level.  After 8 hours, carbon monoxide levels in the blood return to normal. Your oxygen level increases.  After 24 hours, the chance of having a heart attack starts to decrease. Your breath, hair, and body stop smelling like smoke.  After 48 hours, damaged nerve endings begin to recover. Your sense of taste and smell improve.  After 72 hours, the body is virtually free of nicotine. Your bronchial tubes relax and breathing becomes easier.  After 2 to 12 weeks, lungs can hold more air. Exercise becomes easier and circulation improves.  The risk of having a heart attack, stroke, cancer, or lung disease is greatly reduced.  After 1 year, the risk of coronary heart disease is cut in half.  After 5 years, the risk of stroke falls to the same as a nonsmoker.  After 10 years, the risk of lung cancer is cut in half and the risk of other cancers decreases significantly.  After 15 years, the risk of coronary heart disease drops, usually to the level of a nonsmoker.  If you are pregnant, quitting smoking will improve your chances of having a healthy baby.  The people you live with, especially any children, will be healthier.  You will have extra money to spend on things other than cigarettes. QUESTIONS TO THINK ABOUT BEFORE ATTEMPTING TO QUIT You may want to talk about your answers with your caregiver.  Why do you want to quit?  If you tried to quit in the past, what helped and what did not?  What will be the most difficult situations for you after you quit? How will you plan to handle them?  Who can help you through  the tough times? Your family? Friends? A caregiver?  What pleasures do you get from smoking? What ways can you still get pleasure if you quit? Here are some questions to ask your caregiver:  How can you help me to be successful at quitting?  What medicine do you think would be best for me and how should I take it?  What should I do if I need more help?  What is smoking withdrawal like? How can I  get information on withdrawal? GET READY  Set a quit date.  Change your environment by getting rid of all cigarettes, ashtrays, matches, and lighters in your home, car, or work. Do not let people smoke in your home.  Review your past attempts to quit. Think about what worked and what did not. GET SUPPORT AND ENCOURAGEMENT You have a better chance of being successful if you have help. You can get support in many ways.  Tell your family, friends, and co-workers that you are going to quit and need their support. Ask them not to smoke around you.  Get individual, group, or telephone counseling and support. Programs are available at [employer] and health centers. Call your local health department for information about programs in your area.  Spiritual beliefs and practices may help some smokers quit.  Download a "quit meter" on your computer to keep track of quit statistics, such as how long you have gone without smoking, cigarettes not smoked, and money saved.  Get a self-help book about quitting smoking and staying off of tobacco. LEARN NEW SKILLS AND BEHAVIORS  Distract yourself from urges to smoke. Talk to someone, go for a walk, or occupy your time with a task.  Change your normal routine. Take a different route to work. Drink tea instead of coffee. Eat breakfast in a different place.  Reduce your stress. Take a hot bath, exercise, or read a book.  Plan something enjoyable to do every day. Reward yourself for not smoking.  Explore interactive web-based programs that specialize in  helping you quit. GET MEDICINE AND USE IT CORRECTLY Medicines can help you stop smoking and decrease the urge to smoke. Combining medicine with the above behavioral methods and support can greatly increase your chances of successfully quitting smoking.  Nicotine replacement therapy helps deliver nicotine to your body without the negative effects and risks of smoking. Nicotine replacement therapy includes nicotine gum, lozenges, inhalers, nasal sprays, and skin patches. Some may be available over-the-counter and others require a prescription.  Antidepressant medicine helps people abstain from smoking, but how this works is unknown. This medicine is available by prescription.  Nicotinic receptor partial agonist medicine simulates the effect of nicotine in your brain. This medicine is available by prescription. Ask your caregiver for advice about which medicines to use and how to use them based on your health history. Your caregiver will tell you what side effects to look out for if you choose to be on a medicine or therapy. Carefully read the information on the package. Do not use any other product containing nicotine while using a nicotine replacement product.  RELAPSE OR DIFFICULT SITUATIONS Most relapses occur within the first 3 months after quitting. Do not be discouraged if you start smoking again. Remember, most people try several times before finally quitting. You may have symptoms of withdrawal because your body is used to nicotine. You may crave cigarettes, be irritable, feel very hungry, cough often, get headaches, or have difficulty concentrating. The withdrawal symptoms are only temporary. They are strongest when you first quit, but they will go away within 10 14 days. To reduce the chances of relapse, try to:  Avoid drinking alcohol. Drinking lowers your chances of successfully quitting.  Reduce the amount of caffeine you consume. Once you quit smoking, the amount of caffeine in your body  increases and can give you symptoms, such as a rapid heartbeat, sweating, and anxiety.  Avoid smokers because they can make you want to smoke.  Do  not let weight gain distract you. Many smokers will gain weight when they quit, usually less than 10 pounds. Eat a healthy diet and stay active. You can always lose the weight gained after you quit.  Find ways to improve your mood other than smoking. FOR MORE INFORMATION  www.smokefree.gov  Document Released: 01/28/2001 Document Revised: 08/05/2011 Document Reviewed: 05/15/2011 [facility] Patient Information 2014 [place], [place].

## 2012-07-30 NOTE — Progress Notes (Signed)
Patient ID: Marisa [name], female   DOB: May 28, 1975, 37 y.o.   MRN: [id number]  CC: establish  HPI: Pt reports that she is having pelvic pain and not able to work because of painful pelvis and menorrhagia.  She has episodes of severe pelvic pain and reports that she has been to the ER multiple times.  She had multiple imaging studies done and they reveal that she has uterine fibroid present but no other abnormalities seen.  She was referred to gynecology but because she had no medical insurance she did not show up for an appointment.  Pt reports that she is out of her asthma albuterol rescue inhaler and requesting a refill.    Allergies  Allergen Reactions  . Penicillins Anaphylaxis  . Shellfish Allergy Anaphylaxis   Past Medical History  Diagnosis Date  . Asthma   . EP (ectopic pregnancy)   . Ovarian tumor   . Tumor, thyroid    No current outpatient prescriptions on file prior to visit.   No current facility-administered medications on file prior to visit.   Family History  Problem Relation Age of Onset  . Hypertension Mother   . Diabetes Mother   . Cancer Father   . Hyperlipidemia Father   . Hypertension Father    History   Social History  . Marital Status: Married    Spouse Name: N/A    Number of Children: N/A  . Years of Education: N/A   Occupational History  . Not on file.   Social History Main Topics  . Smoking status: Current Some Day Smoker -- 5 years    Types: Cigarettes  . Smokeless tobacco: Not on file  . Alcohol Use: No     Comment: one beer daily  . Drug Use: No  . Sexually Active: Yes    Birth Control/ Protection: None   Other Topics Concern  . Not on file   Social History Narrative  . No narrative on file    Review of Systems  Constitutional: Negative for fever, chills, diaphoresis, activity change, appetite change and fatigue.  HENT: Negative for ear pain, nosebleeds, congestion, facial swelling, rhinorrhea, neck pain, neck stiffness  and ear discharge.   Eyes: Negative for pain, discharge, redness, itching and visual disturbance.  Respiratory: wheezing and SOB   Cardiovascular: Negative for chest pain, palpitations and leg swelling.  Gastrointestinal: Negative for abdominal distention.  Genitourinary: pelvic pain and heavy menstrual periods  Musculoskeletal: Negative for back pain, joint swelling, arthralgias and gait problem.  Neurological: Negative for dizziness, tremors, seizures, syncope, facial asymmetry, speech difficulty, weakness, light-headedness, numbness and headaches.  Hematological: Negative for adenopathy. Does not bruise/bleed easily.  Psychiatric/Behavioral: Negative for hallucinations, behavioral problems, confusion, dysphoric mood, decreased concentration and agitation.    Objective:   Filed Vitals:   07/30/12 1608  BP: 132/92  Pulse: 96  Temp: 98.3 F (36.8 C)  Resp: 16    Physical Exam  Constitutional: Appears well-developed and well-nourished. No distress.  HENT: Normocephalic. External right and left ear normal. Oropharynx is clear and moist.  Eyes: Conjunctivae and EOM are normal. PERRLA, no scleral icterus.  Neck: Normal ROM. Neck supple. No JVD. No tracheal deviation. No thyromegaly.  CVS: RRR, S1/S2 +, no murmurs, no gallops, no carotid bruit.  Pulmonary: tight BS bilateral.  Abdominal: Soft. BS +,  no distension, tenderness, rebound or guarding.  Musculoskeletal: Normal range of motion. No edema and no tenderness.  Lymphadenopathy: No lymphadenopathy noted, cervical, inguinal. Neuro: Alert. Normal reflexes, muscle  tone coordination. No cranial nerve deficit. Skin: Skin is warm and dry. No rash noted. Not diaphoretic. No erythema. No pallor.  Psychiatric: Normal mood and affect. Behavior, judgment, thought content normal.   Lab Results  Component Value Date   WBC 6.8 07/30/2012   HGB 10.7* 07/30/2012   HCT 33.0* 07/30/2012   MCV 81.7 07/30/2012   PLT 496* 07/30/2012   Lab Results   Component Value Date   CREATININE 0.72 07/30/2012   BUN 6 07/30/2012   NA 136 07/30/2012   K 3.5 07/30/2012   CL 105 07/30/2012   CO2 20 07/30/2012   No results found for this basename: HGBA1C   Lipid Panel  No results found for this basename: chol, trig, hdl, cholhdl, vldl, ldlcalc     Assessment and plan:   Patient Active Problem List   Diagnosis Date Noted  . Pelvic pain 07/30/2012  . Polyuria 07/30/2012  . Urinary incontinence 07/30/2012  . Extrinsic asthma, unspecified 07/30/2012       Uterine Fibroids      Anemia      Menorrhagia      Asthma   Check a urinalysis today Refer to gynecology for eval of her pelvic pain and uterine fibroids Check pregnancy test Check CBC Refilled albuterol inhaler Duoneb given in office and her lungs improved considerably  The patient was counseled on the dangers of tobacco use, and was advised to quit.  Reviewed strategies to maximize success, including removing cigarettes and smoking materials from environment and stress management.  Recommend ibuprofen 800mg  po every 8 hours prn severe pain  Follow up in 3 months  The patient was given clear instructions to go to ER or return to medical center if symptoms don't improve, worsen or new problems develop.  The patient verbalized understanding.  The patient was told to call to get lab results if they haven't heard anything in the next week.    Rodney Langton, MD, CDE, FAAFP [facility] [place], [place]

## 2012-07-30 NOTE — ED Provider Notes (Addendum)
History     CSN: [id number]  Arrival date & time 07/30/12  4098   First MD Initiated Contact with Patient 07/30/12 [phone]      Chief Complaint  Patient presents with  . Hematemesis  . Abdominal Pain    (Consider location/radiation/quality/duration/timing/severity/associated sxs/prior treatment) Patient is a 37 y.o. female presenting with abdominal pain. The history is provided by the patient.  Abdominal Pain Associated symptoms include abdominal pain. Pertinent negatives include no chest pain, no headaches and no shortness of breath.  pt c/o abdominal pain, nvd, in past day. Pain dull, cramping, constant, non radiating. No specific exacerbating or allev factors. Few episodes of diarrhea and vomiting. Diarrhea, watery, not bloody. Emesis clear to sl yellowish. No dysuria or gu c/o. No vaginal discharge or bleeding. lnmp2 weeks ago. Denies fever or chills. No known ill contacts or bad food ingestion. Prior abd surgery includes cholecystectomy. Hx uterine fibroids.     Past Medical History  Diagnosis Date  . Asthma   . EP (ectopic pregnancy)   . Ovarian tumor   . Tumor, thyroid     Past Surgical History  Procedure Laterality Date  . Cholecystectomy  2005  . Oophorectomy  2009    left    Family History  Problem Relation Age of Onset  . Hypertension Mother   . Diabetes Mother   . Cancer Father   . Hyperlipidemia Father   . Hypertension Father     History  Substance Use Topics  . Smoking status: Current Some Day Smoker -- 5 years    Types: Cigarettes  . Smokeless tobacco: Not on file  . Alcohol Use: No     Comment: one beer daily    OB History   Grav Para Term Preterm Abortions TAB SAB Ect Mult Living   3 1   1  1          Review of Systems  Constitutional: Negative for fever and chills.  HENT: Negative for neck pain.   Eyes: Negative for redness.  Respiratory: Negative for shortness of breath.   Cardiovascular: Negative for chest pain.  Gastrointestinal:  Positive for vomiting, abdominal pain and diarrhea.  Genitourinary: Negative for dysuria and flank pain.  Musculoskeletal: Negative for back pain.  Skin: Negative for rash.  Neurological: Negative for headaches.  Hematological: Does not bruise/bleed easily.  Psychiatric/Behavioral: Negative for confusion.    Allergies  Penicillins and Shellfish allergy  Home Medications   Current Outpatient Rx  Name  Route  Sig  Dispense  Refill  . ibuprofen (ADVIL,MOTRIN) 200 MG tablet   Oral   Take 200 mg by mouth every 6 (six) hours as needed for pain.         Marland Kitchen ibuprofen (ADVIL,MOTRIN) 800 MG tablet   Oral   Take 1 tablet (800 mg total) by mouth 3 (three) times daily.   21 tablet   0   . ondansetron (ZOFRAN) 4 MG tablet   Oral   Take 1 tablet (4 mg total) by mouth every 6 (six) hours.   12 tablet   0   . oxyCODONE-acetaminophen (PERCOCET/ROXICET) 5-325 MG per tablet   Oral   Take 2 tablets by mouth every 4 (four) hours as needed for pain.   15 tablet   0   . traMADol (ULTRAM) 50 MG tablet   Oral   Take 1 tablet (50 mg total) by mouth every 6 (six) hours as needed for pain.   15 tablet   0   .  traMADol (ULTRAM) 50 MG tablet   Oral   Take 1 tablet (50 mg total) by mouth every 6 (six) hours as needed for pain.   15 tablet   0     BP 99/64  Pulse 115  Temp(Src) 98.1 F (36.7 C) (Oral)  Resp 22  SpO2 97%  LMP 07/06/2012  Physical Exam  Nursing note and vitals reviewed. Constitutional: She appears well-developed and well-nourished. No distress.  HENT:  Mouth/Throat: Oropharynx is clear and moist.  Eyes: Conjunctivae are normal. No scleral icterus.  Neck: Neck supple. No tracheal deviation present.  Cardiovascular: Normal rate, regular rhythm, normal heart sounds and intact distal pulses.   Pulmonary/Chest: Effort normal and breath sounds normal. No respiratory distress.  Abdominal: Soft. Normal appearance and bowel sounds are normal. She exhibits no distension.  There is tenderness.  Mid/diffuse abd tenderness. No rebound or guarding.   Genitourinary:  No cva tenderness  Musculoskeletal: She exhibits no edema and no tenderness.  Neurological: She is alert.  Skin: Skin is warm and dry. No rash noted.  Psychiatric: She has a normal mood and affect.    ED Course  Procedures (including critical care time)   Results for orders placed during the hospital encounter of 07/30/12  CBC WITH DIFFERENTIAL      Result Value Range   WBC 6.8  4.0 - 10.5 K/uL   RBC 4.04  3.87 - 5.11 MIL/uL   Hemoglobin 10.7 (*) 12.0 - 15.0 g/dL   HCT 96.0 (*) 45.4 - 09.8 %   MCV 81.7  78.0 - 100.0 fL   MCH 26.5  26.0 - 34.0 pg   MCHC 32.4  30.0 - 36.0 g/dL   RDW 11.9 (*) 14.7 - 82.9 %   Platelets 496 (*) 150 - 400 K/uL   Neutrophils Relative % 46  43 - 77 %   Neutro Abs 3.1  1.7 - 7.7 K/uL   Lymphocytes Relative 42  12 - 46 %   Lymphs Abs 2.8  0.7 - 4.0 K/uL   Monocytes Relative 10  3 - 12 %   Monocytes Absolute 0.7  0.1 - 1.0 K/uL   Eosinophils Relative 2  0 - 5 %   Eosinophils Absolute 0.1  0.0 - 0.7 K/uL   Basophils Relative 1  0 - 1 %   Basophils Absolute 0.1  0.0 - 0.1 K/uL  COMPREHENSIVE METABOLIC PANEL      Result Value Range   Sodium 136  135 - 145 mEq/L   Potassium 3.5  3.5 - 5.1 mEq/L   Chloride 105  96 - 112 mEq/L   CO2 20  19 - 32 mEq/L   Glucose, Bld 119 (*) 70 - 99 mg/dL   BUN 6  6 - 23 mg/dL   Creatinine, Ser 5.62  0.50 - 1.10 mg/dL   Calcium 8.5  8.4 - 13.0 mg/dL   Total Protein 7.5  6.0 - 8.3 g/dL   Albumin 3.2 (*) 3.5 - 5.2 g/dL   AST 91 (*) 0 - 37 U/L   ALT 46 (*) 0 - 35 U/L   Alkaline Phosphatase 74  39 - 117 U/L   Total Bilirubin 0.2 (*) 0.3 - 1.2 mg/dL   GFR calc non Af Amer >90  >90 mL/min   GFR calc Af Amer >90  >90 mL/min  LIPASE, BLOOD      Result Value Range   Lipase 24  11 - 59 U/L  URINALYSIS, ROUTINE W REFLEX MICROSCOPIC  Result Value Range   Color, Urine AMBER (*) YELLOW   APPearance CLOUDY (*) CLEAR   Specific  Gravity, Urine 1.037 (*) 1.005 - 1.030   pH 5.5  5.0 - 8.0   Glucose, UA NEGATIVE  NEGATIVE mg/dL   Hgb urine dipstick NEGATIVE  NEGATIVE   Bilirubin Urine SMALL (*) NEGATIVE   Ketones, ur NEGATIVE  NEGATIVE mg/dL   Protein, ur 30 (*) NEGATIVE mg/dL   Urobilinogen, UA 0.2  0.0 - 1.0 mg/dL   Nitrite NEGATIVE  NEGATIVE   Leukocytes, UA NEGATIVE  NEGATIVE  URINE MICROSCOPIC-ADD ON      Result Value Range   Squamous Epithelial / LPF MANY (*) RARE   Bacteria, UA FEW (*) RARE   Urine-Other MUCOUS PRESENT    PREGNANCY, URINE      Result Value Range   Preg Test, Ur NEGATIVE  NEGATIVE      MDM  Iv ns bolus. Dilaudid 1 mg iv. zofran iv.  Labs.  Reviewed nursing notes and prior charts for additional history.   Recheck pain persists. Dilaudid 1 mg iv. protonix iv.  Recheck pt, eating and drinking. No recurrent nv during period observation/recheck in ed.  Recheck abd soft nt. Symptoms improved.  On review prior visits, pt w prior visits c/o severe abdominal pain - of note, 2 prior cts this year neg for acute process, and u/s's neg for acute process.  As pt improved, pain improved, no nvd, afeb, and abd soft nt - pt appears stable for d/c.  Discussed w pt as cause prior/current pain not definitively clear, rec close pcp and specialty follow up.  Pt has f/u w gyn today at 4 - encourage to keep that appt.  Will also give referral to gi.   Discussed elevated ast/alt w pt, and need for f/u.   Return to ER if worse, fevers, persistent vomiting, inc pain.          Suzi Roots, MD 07/30/12 1221  Suzi Roots, MD 07/30/12 [phone]

## 2012-07-30 NOTE — Progress Notes (Signed)
Patient is a follow up from hospital Suffers from ovarian cysts Pain medications she was prescribed  Doesn't really help Is working on getting insurance Suffers from really heavy periods

## 2012-07-30 NOTE — ED Notes (Signed)
Pt states that she has abdominal pain that is intermittent due to a cyst.  This am started having diarrhea x2 and vomting x4 with medium amounts of red blood in her emesis.  Pt is currently trying to get some type of insurance so she can start getting proper treatment and follow-up care for this issue.

## 2012-07-30 NOTE — Progress Notes (Signed)
[facility] has seen patient and provided her with a oc application. °

## 2012-07-31 LAB — CBC
HCT: 30.1 % — ABNORMAL LOW (ref 36.0–46.0)
Hemoglobin: 9.7 g/dL — ABNORMAL LOW (ref 12.0–15.0)
MCH: 26.1 pg (ref 26.0–34.0)
MCHC: 32.2 g/dL (ref 30.0–36.0)
MCV: 81.1 fL (ref 78.0–100.0)
Platelets: 470 10*3/uL — ABNORMAL HIGH (ref 150–400)
RBC: 3.71 MIL/uL — ABNORMAL LOW (ref 3.87–5.11)
RDW: 19.3 % — ABNORMAL HIGH (ref 11.5–15.5)
WBC: 6.6 10*3/uL (ref 4.0–10.5)

## 2012-08-05 ENCOUNTER — Telehealth: Payer: Self-pay | Admitting: Family Medicine

## 2012-08-05 ENCOUNTER — Encounter (HOSPITAL_COMMUNITY): Payer: Self-pay | Admitting: Emergency Medicine

## 2012-08-05 ENCOUNTER — Emergency Department (HOSPITAL_COMMUNITY): Payer: Self-pay

## 2012-08-05 ENCOUNTER — Emergency Department (HOSPITAL_COMMUNITY)
Admission: EM | Admit: 2012-08-05 | Discharge: 2012-08-05 | Disposition: A | Discharge status: Home / Self Care | Payer: Self-pay | Attending: Emergency Medicine | Admitting: Emergency Medicine

## 2012-08-05 DIAGNOSIS — Z862 Personal history of diseases of the blood and blood-forming organs and certain disorders involving the immune mechanism: Secondary | ICD-10-CM | POA: Insufficient documentation

## 2012-08-05 DIAGNOSIS — Z8639 Personal history of other endocrine, nutritional and metabolic disease: Secondary | ICD-10-CM | POA: Insufficient documentation

## 2012-08-05 DIAGNOSIS — F17200 Nicotine dependence, unspecified, uncomplicated: Secondary | ICD-10-CM | POA: Insufficient documentation

## 2012-08-05 DIAGNOSIS — Z8742 Personal history of other diseases of the female genital tract: Secondary | ICD-10-CM | POA: Insufficient documentation

## 2012-08-05 DIAGNOSIS — J45901 Unspecified asthma with (acute) exacerbation: Secondary | ICD-10-CM | POA: Insufficient documentation

## 2012-08-05 MED ORDER — IPRATROPIUM BROMIDE 0.02 % IN SOLN
0.5000 mg | Freq: Once | RESPIRATORY_TRACT | Status: AC
  Administered 2012-08-05: 0.5 mg via RESPIRATORY_TRACT
  Filled 2012-08-05: qty 2.5

## 2012-08-05 MED ORDER — ALBUTEROL SULFATE (5 MG/ML) 0.5% IN NEBU
5.0000 mg | INHALATION_SOLUTION | Freq: Once | RESPIRATORY_TRACT | Status: AC
  Administered 2012-08-05: 5 mg via RESPIRATORY_TRACT

## 2012-08-05 MED ORDER — ALBUTEROL SULFATE HFA 108 (90 BASE) MCG/ACT IN AERS: 2.0000 | INHALATION_SPRAY | RESPIRATORY_TRACT | Status: DC | PRN

## 2012-08-05 MED ORDER — ACETAMINOPHEN 325 MG PO TABS: 975.0000 mg | ORAL_TABLET | Freq: Once | ORAL | Status: DC

## 2012-08-05 MED ORDER — ALBUTEROL SULFATE (5 MG/ML) 0.5% IN NEBU
5.0000 mg | INHALATION_SOLUTION | Freq: Once | RESPIRATORY_TRACT | Status: AC
  Administered 2012-08-05: 5 mg via RESPIRATORY_TRACT
  Filled 2012-08-05: qty 1

## 2012-08-05 MED ORDER — PREDNISONE 20 MG PO TABS: 40.0000 mg | ORAL_TABLET | Freq: Every day | ORAL | Status: DC

## 2012-08-05 MED ORDER — ALBUTEROL SULFATE HFA 108 (90 BASE) MCG/ACT IN AERS
2.0000 | INHALATION_SPRAY | RESPIRATORY_TRACT | Status: DC | PRN
  Filled 2012-08-05 (×2): qty 6.7

## 2012-08-05 MED ORDER — PANTOPRAZOLE SODIUM 40 MG PO TBEC: 40.0000 mg | DELAYED_RELEASE_TABLET | Freq: Every day | ORAL | Status: DC

## 2012-08-05 MED ORDER — PREDNISONE 20 MG PO TABS
60.0000 mg | ORAL_TABLET | Freq: Once | ORAL | Status: AC
  Administered 2012-08-05: 60 mg via ORAL
  Filled 2012-08-05: qty 3

## 2012-08-05 NOTE — ED Notes (Signed)
Respiratory at bedside to start wheeze protocol.

## 2012-08-05 NOTE — ED Provider Notes (Signed)
Medical screening examination/treatment/procedure(s) were performed by non-physician practitioner and as supervising physician I was immediately available for consultation/collaboration.  Finian Helvey, MD 08/05/12 1554 

## 2012-08-05 NOTE — ED Notes (Signed)
Patient states started having trouble breathing yesterday.  Patient finds it difficult to speak in full sentences.  Patient states hard to get a deep breath.

## 2012-08-05 NOTE — ED Provider Notes (Signed)
History     CSN: [id number]  Arrival date & time 08/05/12  1056   First MD Initiated Contact with Patient 08/05/12 1100      No chief complaint on file.   (Consider location/radiation/quality/duration/timing/severity/associated sxs/prior treatment) HPI  Marisa [name] is a 37 y.o. female complaining of asthma exacerbation onset this a.m. Patient has not had any albuterol for a month. She is history of hospitalizations and intubations for her asthma. Patient states she was wheezing a little bit last night which woke up this morning it was significantly worse with shortness of breath and chest pain when she takes a deep breath. Patient endorses a dry cough. She denies fever, nausea vomiting, change in bowel or bladder habits.  Past Medical History  Diagnosis Date  . Asthma   . EP (ectopic pregnancy)   . Ovarian tumor   . Tumor, thyroid     Past Surgical History  Procedure Laterality Date  . Cholecystectomy  2005  . Oophorectomy  2009    left    Family History  Problem Relation Age of Onset  . Hypertension Mother   . Diabetes Mother   . Cancer Father   . Hyperlipidemia Father   . Hypertension Father     History  Substance Use Topics  . Smoking status: Current Some Day Smoker -- 5 years    Types: Cigarettes  . Smokeless tobacco: Not on file  . Alcohol Use: No     Comment: one beer daily    OB History   Grav Para Term Preterm Abortions TAB SAB Ect Mult Living   3 1   1  1          Review of Systems  Constitutional:       Negative except as described in HPI  HENT:       Negative except as described in HPI  Respiratory:       Negative except as described in HPI  Cardiovascular:       Negative except as described in HPI  Gastrointestinal:       Negative except as described in HPI  Genitourinary:       Negative except as described in HPI  Musculoskeletal:       Negative except as described in HPI  Skin:       Negative except as described in HPI   Neurological:       Negative except as described in HPI  All other systems reviewed and are negative.    Allergies  Penicillins and Shellfish allergy  Home Medications   Current Outpatient Rx  Name  Route  Sig  Dispense  Refill  . albuterol (PROVENTIL HFA;VENTOLIN HFA) 108 (90 BASE) MCG/ACT inhaler   Inhalation   Inhale 2 puffs into the lungs every 6 (six) hours as needed for wheezing or shortness of breath.   1 Inhaler   4     BP 125/82  Pulse 63  Temp(Src) 98.1 F (36.7 C)  Resp 18  SpO2 100%  LMP 07/06/2012  Physical Exam  Nursing note and vitals reviewed. Constitutional: She is oriented to person, place, and time. She appears well-developed and well-nourished.  HENT:  Head: Normocephalic.  Mouth/Throat: Oropharynx is clear and moist.  Eyes: Conjunctivae and EOM are normal.  Cardiovascular: Normal rate.   Pulmonary/Chest: Effort normal. No stridor.  No stridor, patient is sitting upright, speaking in 2-3 word sentencest. There is poor air movement in all fields, expiration is significantly prolonged and there is very  mild diffuse expiratory wheezing.  Abdominal: Soft. There is no tenderness.  Musculoskeletal: Normal range of motion.  Neurological: She is alert and oriented to person, place, and time.  Psychiatric: She has a normal mood and affect.    ED Course  Procedures (including critical care time)  Labs Reviewed - No data to display Dg Chest 2 View  08/05/2012   *RADIOLOGY REPORT*  Clinical Data: shortness of breath  CHEST - 2 VIEW  Comparison: 10/25/2011  Findings: The heart size and mediastinal contours are within normal limits.  Both lungs are clear.  The visualized skeletal structures are unremarkable.  IMPRESSION: Negative exam.   Original Report Authenticated By: Signa Kell, M.D.     1. Asthma exacerbation, unspecified asthma severity       MDM   Filed Vitals:   08/05/12 1128 08/05/12 1130 08/05/12 1221  BP:  125/82   Pulse:  63    Temp:  98.1 F (36.7 C)   Resp:  18   SpO2: 100% 100% 100%     Marisa [name] is a 37 y.o. female with asthma exacerbation she is having very poor air movement, speaking in 2-3 word sentences. Due a neb orders and prednisone given. Chest x-ray shows no abnormality. After second nebulization treatment air movement is significantly improved and patient also reports a subjective improvement. I will give her an albuterol inhaler. We have discussed the importance of always having an albuterol inhaler with her. Attack about her following up at the [facility]. Extensive discussion of return precautions given.   Medications  acetaminophen (TYLENOL) tablet 975 mg (975 mg Oral Not Given 08/05/12 1228)  albuterol (PROVENTIL HFA;VENTOLIN HFA) 108 (90 BASE) MCG/ACT inhaler 2 puff (2 puffs Inhalation Not Given 08/05/12 1247)  predniSONE (DELTASONE) tablet 60 mg (60 mg Oral Given 08/05/12 1137)  albuterol (PROVENTIL) (5 MG/ML) 0.5% nebulizer solution 5 mg (5 mg Nebulization Given 08/05/12 1125)  ipratropium (ATROVENT) nebulizer solution 0.5 mg (0.5 mg Nebulization Given 08/05/12 1125)  albuterol (PROVENTIL) (5 MG/ML) 0.5% nebulizer solution 5 mg (5 mg Nebulization Given 08/05/12 1219)    Pt is hemodynamically stable, appropriate for, and amenable to discharge at this time. Pt verbalized understanding and agrees with care plan. Outpatient follow-up and specific return precautions discussed.    Discharge Medication List as of 08/05/2012 12:48 PM    START taking these medications   Details  !! albuterol (PROVENTIL HFA;VENTOLIN HFA) 108 (90 BASE) MCG/ACT inhaler Inhale 2 puffs into the lungs every 2 (two) hours as needed for wheezing or shortness of breath (cough)., Starting 08/05/2012, Until Discontinued, Print    predniSONE (DELTASONE) 20 MG tablet Take 2 tablets (40 mg total) by mouth daily., Starting 08/05/2012, Until Discontinued, Print     !! - Potential duplicate medications found. Please  discuss with provider.             Wynetta Emery, PA-C 08/05/12 1552

## 2012-08-05 NOTE — Telephone Encounter (Signed)
1. Pt says script for stomach acid med (pantoprazole (PROTONIX) 40 MG tablet) was not called into [facility].   2. Also she is out of oxycodone that was prescribed at ER and says she is in a lot of pain.   3. Patient also says abuterol is too expensive and wondering if there is a generic med.   Clinical: Pt also would like results for bloodwork.  Arna Medici: Pt also wants to know about GI referral, whether in place?

## 2012-08-10 ENCOUNTER — Ambulatory Visit: Payer: No Typology Code available for payment source | Attending: Family Medicine | Admitting: Internal Medicine

## 2012-08-10 ENCOUNTER — Encounter: Payer: Self-pay | Admitting: Internal Medicine

## 2012-08-10 VITALS — BP 125/85 | HR 85 | Temp 98.6°F | Resp 16 | Ht 65.35 in | Wt 222.0 lb

## 2012-08-10 DIAGNOSIS — J45901 Unspecified asthma with (acute) exacerbation: Secondary | ICD-10-CM | POA: Insufficient documentation

## 2012-08-10 DIAGNOSIS — D649 Anemia, unspecified: Secondary | ICD-10-CM | POA: Insufficient documentation

## 2012-08-10 DIAGNOSIS — J441 Chronic obstructive pulmonary disease with (acute) exacerbation: Secondary | ICD-10-CM | POA: Insufficient documentation

## 2012-08-10 DIAGNOSIS — F17200 Nicotine dependence, unspecified, uncomplicated: Secondary | ICD-10-CM | POA: Insufficient documentation

## 2012-08-10 DIAGNOSIS — J45909 Unspecified asthma, uncomplicated: Secondary | ICD-10-CM

## 2012-08-10 MED ORDER — TIOTROPIUM BROMIDE MONOHYDRATE 18 MCG IN CAPS: 18.0000 ug | ORAL_CAPSULE | Freq: Every day | RESPIRATORY_TRACT | Status: DC

## 2012-08-10 MED ORDER — IPRATROPIUM-ALBUTEROL 0.5-2.5 (3) MG/3ML IN SOLN
3.0000 mL | Freq: Once | RESPIRATORY_TRACT | Status: AC
  Administered 2012-08-10: 3 mL via RESPIRATORY_TRACT

## 2012-08-10 MED ORDER — ALBUTEROL SULFATE HFA 108 (90 BASE) MCG/ACT IN AERS: 2.0000 | INHALATION_SPRAY | Freq: Four times a day (QID) | RESPIRATORY_TRACT | Status: DC | PRN

## 2012-08-10 MED ORDER — IPRATROPIUM BROMIDE HFA 17 MCG/ACT IN AERS: 2.0000 | INHALATION_SPRAY | Freq: Four times a day (QID) | RESPIRATORY_TRACT | Status: DC | PRN

## 2012-08-10 MED ORDER — IPRATROPIUM BROMIDE 0.02 % IN SOLN: 500.0000 ug | Freq: Four times a day (QID) | RESPIRATORY_TRACT | Status: DC | PRN

## 2012-08-10 MED ORDER — IPRATROPIUM-ALBUTEROL 0.5-2.5 (3) MG/3ML IN SOLN: 3.0000 mL | Freq: Once | RESPIRATORY_TRACT | Status: DC

## 2012-08-10 MED ORDER — ALBUTEROL SULFATE (2.5 MG/3ML) 0.083% IN NEBU: 2.5000 mg | INHALATION_SOLUTION | Freq: Four times a day (QID) | RESPIRATORY_TRACT | Status: DC | PRN

## 2012-08-10 NOTE — Progress Notes (Signed)
Patient ID: Marisa [name], female   DOB: 09/22/1975, 37 y.o.   MRN: [id number]  CC: Shortness of breath  HPI: 37 year old female with past medical history of asthma, active smoking so likely COPD although  undiagnosed who presented to our clinic for shortness of breath. Patient did not have health insurance so far and did not have medications for asthma and/or COPD. Patient feels short of breath although she has no wheezing. She is able to complete full sentences. No cough, no chest tightness, no fever or chills. Patient did not use albuterol or Atrovent nebulizer treatment any time recently.  Allergies  Allergen Reactions  . Penicillins Anaphylaxis  . Shellfish Allergy Anaphylaxis   Past Medical History  Diagnosis Date  . Asthma   . EP (ectopic pregnancy)   . Ovarian tumor   . Tumor, thyroid    No current outpatient prescriptions on file prior to visit.   No current facility-administered medications on file prior to visit.   Family History  Problem Relation Age of Onset  . Hypertension Mother   . Diabetes Mother   . Cancer Father   . Hyperlipidemia Father   . Hypertension Father    History   Social History  . Marital Status: Married    Spouse Name: N/A    Number of Children: N/A  . Years of Education: N/A   Occupational History  . Not on file.   Social History Main Topics  . Smoking status: Current Some Day Smoker -- 5 years    Types: Cigarettes  . Smokeless tobacco: Not on file  . Alcohol Use: No     Comment: one beer daily  . Drug Use: No  . Sexually Active: Yes    Birth Control/ Protection: None   Other Topics Concern  . Not on file   Social History Narrative  . No narrative on file    Review of Systems  Constitutional: Negative for fever, chills, diaphoresis, activity change, appetite change and fatigue.  HENT: Negative for ear pain, nosebleeds, congestion, facial swelling, rhinorrhea, neck pain, neck stiffness and ear discharge.   Eyes:  Negative for pain, discharge, redness, itching and visual disturbance.  Respiratory: Negative for cough, choking, chest tightness, shortness of breath, wheezing and stridor.   Cardiovascular: Negative for chest pain, palpitations and leg swelling.  Gastrointestinal: Negative for abdominal distention.  Genitourinary: Negative for dysuria, urgency, frequency, hematuria, flank pain, decreased urine volume, difficulty urinating and dyspareunia.  Musculoskeletal: Negative for back pain, joint swelling, arthralgias and gait problem.  Neurological: Negative for dizziness, tremors, seizures, syncope, facial asymmetry, speech difficulty, weakness, light-headedness, numbness and headaches.  Hematological: Negative for adenopathy. Does not bruise/bleed easily.  Psychiatric/Behavioral: Negative for hallucinations, behavioral problems, confusion, dysphoric mood, decreased concentration and agitation.    Objective:   Filed Vitals:   08/10/12 1116  BP: 125/85  Pulse: 85  Temp: 98.6 F (37 C)  Resp: 16    Physical Exam  Constitutional: Appears well-developed and well-nourished. No distress.  HENT: Normocephalic. External right and left ear normal. Oropharynx is clear and moist.  Eyes: Conjunctivae and EOM are normal. PERRLA, no scleral icterus.  Neck: Normal ROM. Neck supple. No JVD. No tracheal deviation. No thyromegaly.  CVS: RRR, S1/S2 +, no murmurs, no gallops, no carotid bruit.  Pulmonary: Effort and breath sounds normal, no stridor, rhonchi, wheezes, rales.  Abdominal: Soft. BS +,  no distension, tenderness, rebound or guarding.  Musculoskeletal: Normal range of motion. No edema and no tenderness.  Lymphadenopathy:  No lymphadenopathy noted, cervical, inguinal. Neuro: Alert. Normal reflexes, muscle tone coordination. No cranial nerve deficit. Skin: Skin is warm and dry. No rash noted. Not diaphoretic. No erythema. No pallor.  Psychiatric: Normal mood and affect. Behavior, judgment, thought  content normal.   Lab Results  Component Value Date   WBC 6.6 07/30/2012   HGB 9.7* 07/30/2012   HCT 30.1* 07/30/2012   MCV 81.1 07/30/2012   PLT 470* 07/30/2012   Lab Results  Component Value Date   CREATININE 0.72 07/30/2012   BUN 6 07/30/2012   NA 136 07/30/2012   K 3.5 07/30/2012   CL 105 07/30/2012   CO2 20 07/30/2012    No results found for this basename: HGBA1C   Lipid Panel  No results found for this basename: chol, trig, hdl, cholhdl, vldl, ldlcalc       Assessment and plan:   Patient Active Problem List   Diagnosis Date Noted  .  asthma and COPD, acute exacerbation, moderate persistent  - Patient does not have a lung specialist I will provide referral to pulmonary clinic for evaluation with pulmonary function tests  - We will prescribe the following regimen for management of asthma and COPD. We will start with Spiriva once daily for control of COPD. We will use albuterol and Atrovent rescue inhaler every 6 hours as needed and then if this does not work we will use albuterol and Atrovent nebulizer. Should patient continue to have shortness of breath she was instructed to go to emergency room for further evaluation and treatment  07/30/2012  . Anemia - Stable  07/30/2012  . Active smoker - Patient counseled on smoking cessation. Patient is not ready to quit yet. Patient has declined offer for nicotine patch.  07/30/2012

## 2012-08-10 NOTE — Addendum Note (Signed)
Addended by: Lestine Mount on: 08/10/2012 11:54 AM   Modules accepted: Orders

## 2012-08-10 NOTE — Patient Instructions (Addendum)
Asthma Attack Prevention HOW CAN ASTHMA BE PREVENTED? Currently, there is no way to prevent asthma from starting. However, you can take steps to control the disease and prevent its symptoms after you have been diagnosed. Learn about your asthma and how to control it. Take an active role to control your asthma by working with your caregiver to create and follow an asthma action plan. An asthma action plan guides you in taking your medicines properly, avoiding factors that make your asthma worse, tracking your level of asthma control, responding to worsening asthma, and seeking emergency care when needed. To track your asthma, keep records of your symptoms, check your peak flow number using a peak flow meter (handheld device that shows how well air moves out of your lungs), and get regular asthma checkups.  Other ways to prevent asthma attacks include:  Use medicines as your caregiver directs.  Identify and avoid things that make your asthma worse (as much as you can).  Keep track of your asthma symptoms and level of control.  Get regular checkups for your asthma.  With your caregiver, write a detailed plan for taking medicines and managing an asthma attack. Then be sure to follow your action plan. Asthma is an ongoing condition that needs regular monitoring and treatment.  Identify and avoid asthma triggers. A number of outdoor allergens and irritants (pollen, mold, cold air, air pollution) can trigger asthma attacks. Find out what causes or makes your asthma worse, and take steps to avoid those triggers.  Monitor your breathing. Learn to recognize warning signs of an attack, such as slight coughing, wheezing or shortness of breath. However, your lung function may already decrease before you notice any signs or symptoms, so regularly measure and record your peak airflow with a home peak flow meter.  Identify and treat attacks early. If you act quickly, you're less likely to have a severe attack.  You will also need less medicine to control your symptoms. When your peak flow measurements decrease and alert you to an upcoming attack, take your medicine as instructed, and immediately stop any activity that may have triggered the attack. If your symptoms do not improve, get medical help.  Pay attention to increasing quick-relief inhaler use. If you find yourself relying on your quick-relief inhaler (such as albuterol), your asthma is not under control. See your caregiver about adjusting your treatment. IDENTIFY AND CONTROL FACTORS THAT MAKE YOUR ASTHMA WORSE A number of common things can set off or make your asthma symptoms worse (asthma triggers). Keep track of your asthma symptoms for several weeks, detailing all the environmental and emotional factors that are linked with your asthma. When you have an asthma attack, go back to your asthma diary to see which factor, or combination of factors, might have contributed to it. Once you know what these factors are, you can take steps to control many of them.  Allergies: If you have allergies and asthma, it is important to take asthma prevention steps at home. Asthma attacks (worsening of asthma symptoms) can be triggered by allergies, which can cause temporary increased inflammation of your airways. Minimizing contact with the substance to which you are allergic will help prevent an asthma attack. Animal Dander:   Some people are allergic to the flakes of skin or dried saliva from animals with fur or feathers. Keep these pets out of your home.  If you can't keep a pet outdoors, keep the pet out of your bedroom and other sleeping areas at all times,  and keep the door closed.  Remove carpets and furniture covered with cloth from your home. If that is not possible, keep the pet away from fabric-covered furniture and carpets. Dust Mites:  Many people with asthma are allergic to dust mites. Dust mites are tiny bugs that are found in every home, in  mattresses, pillows, carpets, fabric-covered furniture, bedcovers, clothes, stuffed toys, fabric, and other fabric-covered items.  Cover your mattress in a special dust-proof cover.  Cover your pillow in a special dust-proof cover, or wash the pillow each week in hot water. Water must be hotter than 130 F to kill dust mites. Cold or warm water used with detergent and bleach can also be effective.  Wash the sheets and blankets on your bed each week in hot water.  Try not to sleep or lie on cloth-covered cushions.  Call ahead when traveling and ask for a smoke-free hotel room. Bring your own bedding and pillows, in case the hotel only supplies feather pillows and down comforters, which may contain dust mites and cause asthma symptoms.  Remove carpets from your bedroom and those laid on concrete, if you can.  Keep stuffed toys out of the bed, or wash the toys weekly in hot water or cooler water with detergent and bleach. Cockroaches:  Many people with asthma are allergic to the droppings and remains of cockroaches.  Keep food and garbage in closed containers. Never leave food out.  Use poison baits, traps, powders, gels, or paste (for example, boric acid).  If a spray is used to kill cockroaches, stay out of the room until the odor goes away. Indoor Mold:  Fix leaky faucets, pipes, or other sources of water that have mold around them.  Clean floors and moldy surfaces with a fungicide or diluted bleach.  Avoid using humidifiers, vaporizers, or swamp coolers. These can spread molds through the air. Pollen and Outdoor Mold:  When pollen or mold spore counts are high, try to keep your windows closed.  Stay indoors with windows closed from late morning to afternoon, if you can. Pollen and some mold spore counts are highest at that time.  Ask your caregiver whether you need to take or increase anti-inflammatory medicine before your allergy season starts. Irritants:   Tobacco smoke is  an irritant. If you smoke, ask your caregiver how you can quit. Ask family members to quit smoking, too. Do not allow smoking in your home or car.  If possible, do not use a wood-burning stove, kerosene heater, or fireplace. Minimize exposure to all sources of smoke, including incense, candles, fires, and fireworks.  Try to stay away from strong odors and sprays, such as perfume, talcum powder, hair spray, and paints.  Decrease humidity in your home and use an indoor air cleaning device. Reduce indoor humidity to below 60 percent. Dehumidifiers or central air conditioners can do this.  Decrease house dust exposure by changing furnace and air cooler filters frequently.  Try to have someone else vacuum for you once or twice a week, if you can. Stay out of rooms while they are being vacuumed and for a short while afterward.  If you vacuum, use a dust mask from a hardware store, a double-layered or microfilter vacuum cleaner bag, or a vacuum cleaner with a HEPA filter.  Sulfites in foods and beverages can be irritants. Do not drink beer or wine, or eat dried fruit, processed potatoes, or shrimp if they cause asthma symptoms.  Cold air can trigger an asthma attack.  Cover your nose and mouth with a scarf on cold or windy days.  Several health conditions can make asthma more difficult to manage, including runny nose, sinus infections, reflux disease, psychological stress, and sleep apnea. Your caregiver will treat these conditions, as well.  Avoid close contact with people who have a cold or the flu, since your asthma symptoms may get worse if you catch the infection from them. Wash your hands thoroughly after touching items that may have been handled by people with a respiratory infection.  Get a flu shot every year to protect against the flu virus, which often makes asthma worse for days or weeks. Also get a pneumonia shot once every 5 10 years. Medicines:  Aspirin and other pain relievers can  cause asthma attacks. Ten percent to 20% of people with asthma have sensitivity to aspirin or a group of pain relievers called non-steroidal anti-inflammatory medicines (NSAIDS), such as ibuprofen and naproxen. These medicines are used to treat pain and reduce fevers. Asthma attacks caused by any of these medicines can be severe and even fatal. These medicines must be avoided in people who have known aspirin sensitive asthma. Products with acetaminophen are considered safe for people who have asthma. It is important that people with aspirin sensitivity read labels of all over-the-counter medicines used to treat pain, colds, coughs, and fever.  Beta blockers and ACE inhibitors are other medicines which you should discuss with your caregiver, in relation to your asthma. ALLERGY SKIN TESTING  Ask your asthma caregiver about allergy skin testing or blood testing (RAST test) to identify the allergens to which you are sensitive. If you are found to have allergies, allergy shots (immunotherapy) for asthma may help prevent future allergies and asthma. With allergy shots, small doses of allergens (substances to which you are allergic) are injected under your skin on a regular schedule. Over a period of time, your body may become used to the allergen and less responsive with asthma symptoms. You can also take measures to minimize your exposure to those allergens. EXERCISE  If you have exercise-induced asthma, or are planning vigorous exercise, or exercise in cold, humid, or dry environments, prevent exercise-induced asthma by following your caregiver's advice regarding asthma treatment before exercising. Document Released: 01/22/2009 Document Revised: 01/21/2012 Document Reviewed: 01/22/2009 [facility] Patient Information 2014 [place], [place]. Asthma, Adult Asthma is a condition that affects your lungs. It is characterized by swelling and narrowing of your airways as well as increased mucus production. The narrowing  comes from swelling and muscle spasms inside the airways. When this happens, breathing can be difficult and you can have coughing, wheezing, and shortness of breath. Knowing more about asthma can help you manage it better. Asthma cannot be cured, but medicines and lifestyle changes can help control it. Asthma can be a minor problem for some people but if it is not controlled it can lead to a life-threatening asthma attack. Asthma can change over time. It is important to work with your caregiver to manage your asthma symptoms. CAUSES The exact cause of asthma is unknown. Asthma is believed to be caused by inherited (genetic) and environmental exposures. Swelling and redness (inflammation) of the airways occurs in asthma. This can be triggered by allergies, viral lung infections, or irritants in the air. Allergic reactions can cause you to wheeze immediately or several hours after an exposure. Asthma triggers are different for each person. It is important to pay attention and know what triggers your asthma.  Common triggers for asthma attacks  include:  Animal dander from the skin, hair, or feathers of animals.  Dust mites contained in house dust.  Cockroaches.  Pollen from trees or grass.  Mold.  Cigarette or tobacco smoke. Smoking cannot be allowed in homes of people with asthma. People with asthma should not smoke and should not be around smokers.  Air pollutants such as dust, household cleaners, hair sprays, aerosol sprays, paint fumes, strong chemicals, or strong odors.  Cold air or weather changes. Cold air may cause inflammation. Winds increase molds and pollens in the air. There is not one best climate for people with asthma.  Strong emotions such as crying or laughing hard.  Stress.  Certain medicines such as aspirin or beta-blockers.  Sulfites in such foods and drinks as dried fruits and wine.  Infections or inflammatory conditions such as the flu, a cold, or an inflammation of  the nasal membranes (rhinitis).  Gastroesophageal reflux disease (GERD). GERD is a condition where stomach acid backs up into your throat (esophagus).  Exercise or strenous activity. Proper pre-exercise medicines allow most people to participate in sports. SYMPTOMS  Feeling short of breath.  Chest tightness or pain.  Difficulty sleeping due to coughing, wheezing, or feeling short of breath.  A whistling or wheezing sound with exhalation.  Coughing or wheezing that is worse when you:  Have a virus (such as a cold or the flu).  Are suffering from allergies.  Are exposed to certain fumes or chemicals.  Exercise. Signs that your asthma is probably getting worse include:   More frequent and bothersome asthma signs and symptoms.  Increasing difficulty breathing. This can be measured by a peak flow meter, which is a simple device used to check how well your lungs are working.  An increasingly frequent need to use a quick-relief inhaler. DIAGNOSIS  The diagnosis of asthma is made by review of your medical history, a physical exam, and possibly from other tests. Lung function studies may help with the diagnosis. TREATMENT  Asthma cannot be cured. However, for the majority of adults, asthma can be controlled with treatment. Besides avoidance of triggers of your asthma, medicines are often required. There are 2 classes of medicine used for asthma treatment: controller medicines (reduce inflammation and symptoms) andreliever or rescue medicines (relieve asthma symptoms during acute attacks). You may require daily medicines to control your asthma. The most effective long-term controller medicines for asthma are inhaled corticosteroids (blocks inflammation). Other long-term control medicines include:  Leukotriene receptor antagonists (blocks a pathway of inflammation).  Long-acting beta2-agonists (relaxes the muscles of the airways for at least 12 hours) with an inhaled  corticosteroid.  Cromolyn sodium or nedocromil (alters certain inflammatory cells' ability to release chemicals that cause inflammation).  Immunomodulators (alters the immune system to prevent asthma symptoms).  Theophylline (relaxes muscles in the airways). You may also require a short-acting beta2-agonist to relieve asthma symptoms during an acute attack. You should understand what to do during an acute attack. Inhaled medicines are effective when used properly. Read the instructions on how to use your medicines correctly and speak to your caregiver if you have questions. Follow up with your caregiver on a regular basis to make sure your asthma is well-controlled. If your asthma is not well-controlled, if you have been hospitalized for asthma, or if multiple medicines or medium to high doses of inhaled corticosteroids are needed to control your asthma, request a referral to an asthma specialist. HOME CARE INSTRUCTIONS   Take medicines as directed by your caregiver.  Control your home environment in the following ways to help prevent asthma attacks:  Change your heating and air conditioning filter at least once a month.  Place a filter or cheesecloth over your heating and air conditioning vents.  Limit the use of fireplaces and wood stoves.  Do not smoke. Do not stay in places where others are smoking.  Get rid of pests (such as roaches and mice) and their droppings.  If you see mold on a plant, throw it away.  Clean your floors and dust every week. Use unscented cleaning products. Use a vacuum cleaner with a HEPA filter if possible. If vacuuming or cleaning triggers your asthma, try to find someone else to do these chores.  Floors in your house should be wood, tile, or vinyl. Carpet can trap dander and dust.  Use allergy-proof pillows, mattress covers, and box spring covers.  Wash bedsheets and blankets every week in hot water and dry in a dryer.  Use a blanket that is made of  polyester or cotton with a tight nap.  Do not use a dust ruffle on your bed.  Clean bathrooms and kitchens with bleach and repaint with mold-resistant paint.  Wash hands frequently.  Talk to your caregiver about an action plan for managing asthma attacks. This includes the use of a peak flow meter which measures the severity of the attack and medicines that can help stop the attack. An action plan can help minimize or stop the attack without having to seek medical care.  Remain calm during an asthma attack.  Always have a plan prepared for seeking medical attention. This should include contacting your caregiver and in the case of a severe attack, calling your local emergency services (911 in [place] SEEK MEDICAL CARE IF:   You have wheezing, shortness of breath, or a cough even if taking medicine to prevent attacks.  You have thickening of sputum.  Your sputum changes from clear or white to yellow, green, gray, or bloody.  You have any problems that may be related to the medicines you are taking (such as a rash, itching, swelling, or trouble breathing).  You are using a reliever medicine more than 2 3 times per week.  Your peak flow is still at 50 79% of personal best after following your action plan for 1 hour. SEEK IMMEDIATE MEDICAL CARE IF:   You are short of breath even at rest.  You get short of breath when doing very little physical activity.  You have difficulty eating, drinking, or talking due to asthma symptoms.  You have chest pain or you feel that your heart is beating fast.  You have a bluish color to your lips or fingernails.  You are lightheaded, dizzy, or faint.  You have a fever or persistent symptoms for more than 2 3 days.  You have a fever and symptoms suddenly get worse.  You seem to be getting worse and are unresponsive to treatment during an asthma attack.  Your peak flow is less than 50% of personal best. MAKE SURE YOU:   Understand these  instructions.  Will watch your condition.  Will get help right away if you are not doing well or get worse. Document Released: 02/03/2005 Document Revised: 01/21/2012 Document Reviewed: 09/22/2007 [facility] Patient Information 2014 [place], [place]. Chronic Obstructive Pulmonary Disease Chronic obstructive pulmonary disease (COPD) is a condition in which airflow from the lungs is restricted. The lungs can never return to normal, but there are measures you can take which  will improve them and make you feel better. CAUSES   Smoking.  Exposure to secondhand smoke.  Breathing in irritants such as air pollution, dust, cigarette smoke, strong odors, aerosol sprays, or paint fumes.  History of lung infections. SYMPTOMS   Deep, persistent (chronic) cough with a large amount of thick mucus.  Wheezing.  Shortness of breath, especially with physical activity.  Feeling like you cannot get enough air.  Difficulty breathing.  Rapid breaths (tachypnea).  Gray or bluish discoloration (cyanosis) of the skin, especially in fingers, toes, or lips.  Fatigue.  Weight loss.  Swelling in legs, ankles, or feet.  Fast heartbeat (tachycardia).  Frequent lung infections.   Chest tightness. DIAGNOSIS  Initial diagnosis may be based on your history, symptoms, and physical examination. Additional tests for COPD may include:  Chest X-ray.  Computed tomography (CT) scan.  Lung (pulmonary) function tests.  Blood tests. TREATMENT  Treatment focuses on making you comfortable (supportive care). Your caregiver may prescribe medicines (inhaled or pills) to help improve your breathing. Additional treatment options may include oxygen therapy and pulmonary rehabilitation. Treatment should also include reducing your exposure to known irritants and following a plan to stop smoking. HOME CARE INSTRUCTIONS   Take all medicines, including antibiotic medicines, as directed by your caregiver.  Use inhaled  medicines as directed by your caregiver.  Avoid medicines or cough syrups that dry up your airway (antihistamines) and slow down the elimination of secretions. This decreases respiratory capacity and may lead to infections.  If you smoke, stop smoking.  Avoid exposure to smoke, chemicals, and fumes that aggravate your breathing.  Avoid contact with individuals that have a contagious illness.  Avoid extreme temperature and humidity changes.  Use humidifiers at home and at your bedside if they do not make breathing difficult.  Drink enough water and fluids to keep your urine clear or pale yellow. This loosens secretions.  Eat healthy foods. Eating smaller, more frequent meals and resting before meals may help you maintain your strength.  Ask your caregiver about the use of vitamins and mineral supplements.  Stay active. Exercise and physical activity will help maintain your ability to do things you want to do.  Balance activity with periods of rest.  Assume a position of comfort if you become short of breath.  Learn and use relaxation techniques.  Learn and use controlled breathing techniques as directed by your caregiver. Controlled breathing techniques include:  Pursed lip breathing. This breathing technique starts with breathing in (inhaling) through your nose for 1 second. Next, purse your lips as if you were going to whistle. Then breathe out (exhale) through the pursed lips for 2 seconds.  Diaphragmatic breathing. Start by putting one hand on your abdomen just above your waist. Inhale slowly through your nose. The hand on your abdomen should move out. Then exhale slowly through pursed lips. You should be able to feel the hand on your abdomen moving in as you exhale.  Learn and use controlled coughing to clear mucus from your lungs. Controlled coughing is a series of short, progressive coughs. The steps of controlled coughing are: 1. Lean your head slightly forward. 2. Breathe  in deeply using diaphragmatic breathing. 3. Try to hold your breath for 3 seconds. 4. Keep your mouth slightly open while coughing twice. 5. Spit any mucus out into a tissue. 6. Rest and repeat the steps once or twice as needed.  Receive all protective vaccines your caregiver suggests, especially pneumococcal and influenza vaccines.  Learn  to manage stress.  Schedule and attend all follow-up appointments as directed by your caregiver. It is important to keep all your appointments.  Participate in pulmonary rehabilitation as directed by your caregiver.  Use home oxygen as suggested. SEEK MEDICAL CARE IF:   You are coughing up more mucus than usual.  There is a change in the color or thickness of the mucus.  Breathing is more labored than usual.  Your breathing is faster than usual.  Your skin color is more cyanotic than usual.  You are running out of the medicine you take for your breathing.  You are anxious, apprehensive, or restless.  You have a fever. SEEK IMMEDIATE MEDICAL CARE IF:   You have a rapid heart rate.  You have shortness of breath while you are resting.  You have shortness of breath that prevents you from being able to talk.  You have shortness of breath that prevents you from performing your usual physical activities.  You have chest pain lasting longer than 5 minutes.  You have a seizure.  Your family or friends notice that you are agitated or confused. MAKE SURE YOU:   Understand these instructions.  Will watch your condition.  Will get help right away if you are not doing well or get worse. Document Released: 11/13/2004 Document Revised: 10/29/2011 Document Reviewed: 04/05/2010 [facility] Patient Information 2014 [place], [place].

## 2012-08-10 NOTE — Progress Notes (Signed)
Pt c/o asthma onset December 2013... Recently seen at [facility] ED on 6/19; given albuterol and prednisone; has not taken prednisone yet ... sxs include f/v/n/d, SOB, wheezing... Needing refills on meds... She is alert and oriented w/no signs of acute respiratory distress.

## 2012-09-07 ENCOUNTER — Ambulatory Visit (INDEPENDENT_AMBULATORY_CARE_PROVIDER_SITE_OTHER): Payer: No Typology Code available for payment source | Admitting: Pulmonary Disease

## 2012-09-07 ENCOUNTER — Encounter: Payer: Self-pay | Admitting: Pulmonary Disease

## 2012-09-07 VITALS — BP 130/90 | HR 93 | Temp 98.5°F | Ht 65.5 in | Wt 224.4 lb

## 2012-09-07 DIAGNOSIS — R0609 Other forms of dyspnea: Secondary | ICD-10-CM

## 2012-09-07 DIAGNOSIS — R0989 Other specified symptoms and signs involving the circulatory and respiratory systems: Secondary | ICD-10-CM

## 2012-09-07 DIAGNOSIS — R0600 Dyspnea, unspecified: Secondary | ICD-10-CM | POA: Insufficient documentation

## 2012-09-07 HISTORY — DX: Dyspnea, unspecified: R06.00

## 2012-09-07 NOTE — Progress Notes (Signed)
  Subjective:    Patient ID: Marisa [name], female    DOB: 04-Aug-1975, 37 y.o.   MRN: [id number]  HPI The patient is a 37 year old female who is to see for possible asthma.  The patient states that she had asthma as a child, her symptoms resolved during her teenage years and 83s, and now have returned in her 30s.  She believes that her symptoms are worsening over the last 2 years, especially the last one year.  She complains of significant dyspnea on exertion with just mild to moderate activity such as a flight of stairs or light housework.  However, she is morbidly obese, and admits to having deconditioning.  She tells me that she has been to the emergency room 12-13 times in the last one year for shortness of breath.  She has been on frequent prednisone tapers, the last of which was in June of this year.  Her "attacks" consist of severe shortness of breath even at rest, as well as cough.  The patient states that she has year round allergies, especially with eye and nasal symptoms.  She has not had any recent allergy testing.   Review of Systems  Constitutional: Positive for unexpected weight change. Negative for fever.  HENT: Positive for congestion, sneezing, trouble swallowing and postnasal drip. Negative for ear pain, nosebleeds, sore throat, rhinorrhea, dental problem and sinus pressure.   Eyes: Negative for redness and itching.  Respiratory: Positive for cough, chest tightness and shortness of breath. Negative for wheezing.   Cardiovascular: Positive for palpitations and leg swelling.  Gastrointestinal: Positive for abdominal pain. Negative for nausea and vomiting.  Genitourinary: Negative for dysuria.  Musculoskeletal: Negative for joint swelling.  Skin: Negative for rash.  Neurological: Positive for dizziness, light-headedness and headaches.  Hematological: Does not bruise/bleed easily.  Psychiatric/Behavioral: Positive for dysphoric mood. The patient is nervous/anxious.         Objective:   Physical Exam Constitutional:  Obese female, no acute distress  HENT:  Nares patent without discharge, large turbinates  Oropharynx without exudate, palate and uvula thick and elongated.   Eyes:  Perrla, eomi, no scleral icterus  Neck:  No JVD, no TMG  Cardiovascular:  Normal rate, regular rhythm, no rubs or gallops.  No murmurs        Intact distal pulses  Pulmonary :  Normal breath sounds, no stridor or respiratory distress   No rales, rhonchi, or wheezing  Abdominal:  Soft, nondistended, bowel sounds present.  No tenderness noted.   Musculoskeletal:  No lower extremity edema noted.  Lymph Nodes:  No cervical lymphadenopathy noted  Skin:  No cyanosis noted  Neurologic:  Alert, appropriate, moves all 4 extremities without obvious deficit.         Assessment & Plan:

## 2012-09-07 NOTE — Patient Instructions (Addendum)
Stop spiriva and atrovent inhaler Can stay on albuterol/atrovent nebulizer medications while at home, and albuterol when away from home. Will schedule for methacholine challenge test to see if you truly have asthma.

## 2012-09-07 NOTE — Assessment & Plan Note (Addendum)
The patient has been having episodes of shortness of breath that she is attributing to asthma.  She has had multiple emergency room visits over the last one year, and is usually treated with prednisone.  She is currently on a bronchodilator regimen that does not include inhaled corticosteroids.  She has had spirometry today that shows no evidence for airflow obstruction, but does have restriction that is probably related to her obesity.  There are a few questions that remain unclear at this time.  One is whether she truly has asthma or not, and the other is whether her episodes of breathing have anything to do with asthma.  I think it is very important that we at least answer the first question, and we'll therefore schedule her for a methacholine challenge test to put the issue to rest.  If this is not asthma, then I would consider whether she may have vocal cord dysfunction, whether her dyspnea on exertion is attributable to something else, or whether anxiety may be playing a major role here.  I will see her back once her methacholine challenge test is done.

## 2012-09-13 ENCOUNTER — Ambulatory Visit (HOSPITAL_COMMUNITY)
Admission: RE | Admit: 2012-09-13 | Discharge: 2012-09-13 | Disposition: A | Discharge status: Home / Self Care | Payer: No Typology Code available for payment source | Source: Ambulatory Visit | Attending: Pulmonary Disease | Admitting: Pulmonary Disease

## 2012-09-13 ENCOUNTER — Telehealth: Payer: Self-pay | Admitting: Pulmonary Disease

## 2012-09-13 DIAGNOSIS — R0989 Other specified symptoms and signs involving the circulatory and respiratory systems: Secondary | ICD-10-CM | POA: Insufficient documentation

## 2012-09-13 DIAGNOSIS — R0609 Other forms of dyspnea: Secondary | ICD-10-CM | POA: Insufficient documentation

## 2012-09-13 DIAGNOSIS — R0600 Dyspnea, unspecified: Secondary | ICD-10-CM

## 2012-09-13 LAB — PULMONARY FUNCTION TEST

## 2012-09-13 MED ORDER — SODIUM CHLORIDE 0.9 % IN NEBU
3.0000 mL | INHALATION_SOLUTION | Freq: Once | RESPIRATORY_TRACT | Status: AC
  Administered 2012-09-13: 3 mL via RESPIRATORY_TRACT

## 2012-09-13 MED ORDER — ALBUTEROL SULFATE (5 MG/ML) 0.5% IN NEBU
2.5000 mg | INHALATION_SOLUTION | Freq: Once | RESPIRATORY_TRACT | Status: AC
  Administered 2012-09-13: 2.5 mg via RESPIRATORY_TRACT

## 2012-09-13 MED ORDER — METHACHOLINE 16 MG/ML NEB SOLN: 2.0000 mL | Freq: Once | RESPIRATORY_TRACT | Status: DC

## 2012-09-13 MED ORDER — METHACHOLINE 1 MG/ML NEB SOLN: 2.0000 mL | Freq: Once | RESPIRATORY_TRACT | Status: DC

## 2012-09-13 MED ORDER — METHACHOLINE 0.25 MG/ML NEB SOLN: 2.0000 mL | Freq: Once | RESPIRATORY_TRACT | Status: DC

## 2012-09-13 MED ORDER — METHACHOLINE 0.0625 MG/ML NEB SOLN
2.0000 mL | Freq: Once | RESPIRATORY_TRACT | Status: AC
  Administered 2012-09-13: 0.125 mg via RESPIRATORY_TRACT

## 2012-09-13 MED ORDER — METHACHOLINE 4 MG/ML NEB SOLN: 2.0000 mL | Freq: Once | RESPIRATORY_TRACT | Status: DC

## 2012-09-13 NOTE — Telephone Encounter (Signed)
I spoke with Tammy. She stated pt had [facility] today. She already gave pt 1 albuterol neb tx and her FEV1 is at -36. She does not feel comfortable with pt leaving with that # and wants the okay to give her 1 more breathing tx--but per protocol needs our okay for this. Pt does have albuterol in her purse. Since [facility] is not in will forward to Dr. Craige Cotta to advise. Thanks  Allergies  Allergen Reactions  . Penicillins Anaphylaxis  . Shellfish Allergy Anaphylaxis  . Other     Mushroom-- swelling throat, eyes Animals-- swelling throat, eyes

## 2012-09-13 NOTE — Telephone Encounter (Signed)
Okay to give extra albuterol treatment

## 2012-09-13 NOTE — Telephone Encounter (Signed)
I spoke with Tammy and made her aware. Nothing further was needed

## 2012-09-15 ENCOUNTER — Encounter (HOSPITAL_COMMUNITY): Payer: Self-pay | Admitting: *Deleted

## 2012-09-15 ENCOUNTER — Emergency Department (HOSPITAL_COMMUNITY)
Admission: EM | Admit: 2012-09-15 | Discharge: 2012-09-15 | Disposition: A | Discharge status: Home / Self Care | Payer: No Typology Code available for payment source | Attending: Emergency Medicine | Admitting: Emergency Medicine

## 2012-09-15 DIAGNOSIS — Z862 Personal history of diseases of the blood and blood-forming organs and certain disorders involving the immune mechanism: Secondary | ICD-10-CM | POA: Insufficient documentation

## 2012-09-15 DIAGNOSIS — F17200 Nicotine dependence, unspecified, uncomplicated: Secondary | ICD-10-CM | POA: Insufficient documentation

## 2012-09-15 DIAGNOSIS — N644 Mastodynia: Secondary | ICD-10-CM | POA: Insufficient documentation

## 2012-09-15 DIAGNOSIS — Z8679 Personal history of other diseases of the circulatory system: Secondary | ICD-10-CM | POA: Insufficient documentation

## 2012-09-15 DIAGNOSIS — Z880 Allergy status to penicillin: Secondary | ICD-10-CM | POA: Insufficient documentation

## 2012-09-15 DIAGNOSIS — Z8742 Personal history of other diseases of the female genital tract: Secondary | ICD-10-CM | POA: Insufficient documentation

## 2012-09-15 DIAGNOSIS — Z3202 Encounter for pregnancy test, result negative: Secondary | ICD-10-CM | POA: Insufficient documentation

## 2012-09-15 DIAGNOSIS — R519 Headache, unspecified: Secondary | ICD-10-CM

## 2012-09-15 DIAGNOSIS — Z79899 Other long term (current) drug therapy: Secondary | ICD-10-CM | POA: Insufficient documentation

## 2012-09-15 DIAGNOSIS — J45909 Unspecified asthma, uncomplicated: Secondary | ICD-10-CM | POA: Insufficient documentation

## 2012-09-15 DIAGNOSIS — R51 Headache: Secondary | ICD-10-CM | POA: Insufficient documentation

## 2012-09-15 DIAGNOSIS — Z8669 Personal history of other diseases of the nervous system and sense organs: Secondary | ICD-10-CM | POA: Insufficient documentation

## 2012-09-15 DIAGNOSIS — Z8639 Personal history of other endocrine, nutritional and metabolic disease: Secondary | ICD-10-CM | POA: Insufficient documentation

## 2012-09-15 LAB — CBC WITH DIFFERENTIAL/PLATELET
Basophils Absolute: 0 10*3/uL (ref 0.0–0.1)
Basophils Relative: 1 % (ref 0–1)
Eosinophils Absolute: 0.1 10*3/uL (ref 0.0–0.7)
Eosinophils Relative: 2 % (ref 0–5)
HCT: 34.7 % — ABNORMAL LOW (ref 36.0–46.0)
Hemoglobin: 11 g/dL — ABNORMAL LOW (ref 12.0–15.0)
Lymphocytes Relative: 31 % (ref 12–46)
Lymphs Abs: 2 10*3/uL (ref 0.7–4.0)
MCH: 27.2 pg (ref 26.0–34.0)
MCHC: 31.7 g/dL (ref 30.0–36.0)
MCV: 85.9 fL (ref 78.0–100.0)
Monocytes Absolute: 0.5 10*3/uL (ref 0.1–1.0)
Monocytes Relative: 8 % (ref 3–12)
Neutro Abs: 3.9 10*3/uL (ref 1.7–7.7)
Neutrophils Relative %: 59 % (ref 43–77)
Platelets: 484 10*3/uL — ABNORMAL HIGH (ref 150–400)
RBC: 4.04 MIL/uL (ref 3.87–5.11)
RDW: 17 % — ABNORMAL HIGH (ref 11.5–15.5)
WBC: 6.5 10*3/uL (ref 4.0–10.5)

## 2012-09-15 LAB — BASIC METABOLIC PANEL
BUN: 7 mg/dL (ref 6–23)
CO2: 23 mEq/L (ref 19–32)
Calcium: 9.3 mg/dL (ref 8.4–10.5)
Chloride: 102 mEq/L (ref 96–112)
Creatinine, Ser: 0.63 mg/dL (ref 0.50–1.10)
GFR calc Af Amer: >90 mL/min (ref >90)
GFR calc non Af Amer: >90 mL/min (ref >90)
Glucose, Bld: 92 mg/dL (ref 70–99)
Potassium: 3.8 mEq/L (ref 3.5–5.1)
Sodium: 137 mEq/L (ref 135–145)

## 2012-09-15 LAB — POCT PREGNANCY, URINE: Preg Test, Ur: NEGATIVE

## 2012-09-15 LAB — HCG, QUANTITATIVE, PREGNANCY: hCG, Beta Chain, Quant, S: <1 m[IU]/mL (ref <5)

## 2012-09-15 MED ORDER — DIPHENHYDRAMINE HCL 50 MG/ML IJ SOLN
25.0000 mg | Freq: Once | INTRAMUSCULAR | Status: AC
  Administered 2012-09-15: 25 mg via INTRAVENOUS
  Filled 2012-09-15: qty 1

## 2012-09-15 MED ORDER — SODIUM CHLORIDE 0.9 % IV BOLUS (SEPSIS)
1000.0000 mL | Freq: Once | INTRAVENOUS | Status: AC
  Administered 2012-09-15: 1000 mL via INTRAVENOUS

## 2012-09-15 MED ORDER — MORPHINE SULFATE 4 MG/ML IJ SOLN
4.0000 mg | Freq: Once | INTRAMUSCULAR | Status: AC
  Administered 2012-09-15: 4 mg via INTRAVENOUS
  Filled 2012-09-15: qty 1

## 2012-09-15 MED ORDER — SODIUM CHLORIDE 0.9 % IV SOLN
Freq: Once | INTRAVENOUS | Status: AC
  Administered 2012-09-15: 12:00:00 via INTRAVENOUS

## 2012-09-15 MED ORDER — METOCLOPRAMIDE HCL 10 MG PO TABS: 10.0000 mg | ORAL_TABLET | Freq: Four times a day (QID) | ORAL | Status: DC | PRN

## 2012-09-15 MED ORDER — METOCLOPRAMIDE HCL 5 MG/ML IJ SOLN
10.0000 mg | Freq: Once | INTRAMUSCULAR | Status: AC
  Administered 2012-09-15: 10 mg via INTRAVENOUS
  Filled 2012-09-15: qty 2

## 2012-09-15 MED ORDER — OXYCODONE-ACETAMINOPHEN 5-325 MG PO TABS: 1.0000 | ORAL_TABLET | Freq: Four times a day (QID) | ORAL | Status: DC | PRN

## 2012-09-15 NOTE — Progress Notes (Signed)
[facility] CL spoke with patient. 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 Set her up with an appt at [facility] on 8/8. Provided patient with appt information.

## 2012-09-15 NOTE — ED Provider Notes (Signed)
CSN: [id number]     Arrival date & time 09/15/12  1010 History     First MD Initiated Contact with Patient 09/15/12 1030     No chief complaint on file.  (Consider location/radiation/quality/duration/timing/severity/associated sxs/prior Treatment) The history is provided by the patient.  Marisa [name] is a 37 y.o. female history of asthma, ectopic pregnancy status post left ovary removal, here presenting with bilateral breast pain. She's been trying to get pregnant for last several months but without much success. Has unprotected sex with husband. Last as her period was 2 weeks ago and since then she's been having bilateral breast pain. She states that both of her breasts a getting more large and painful and is difficult to fit into her bra. Denies any abdominal pain or vaginal bleeding. Also has some headaches but that's very similar to her typical migraines. Headaches are not acute onset and not associated with blurry vision. Denies fevers or neck pain.    Past Medical History  Diagnosis Date  . Asthma   . EP (ectopic pregnancy)   . Ovarian tumor   . Tumor, thyroid   . Sleep apnea   . Chronic headaches   . Irregular heartbeat    Past Surgical History  Procedure Laterality Date  . Cholecystectomy  2005  . Oophorectomy  2009    left   Family History  Problem Relation Age of Onset  . Hypertension Mother   . Diabetes Mother   . Cancer Father   . Hyperlipidemia Father   . Hypertension Father   . Allergies Mother   . Heart disease Mother   . Heart disease Maternal Grandmother    History  Substance Use Topics  . Smoking status: Current Some Day Smoker -- 0.25 packs/day for 26 years    Types: Cigarettes  . Smokeless tobacco: Never Used     Comment: 1 pack per week  . Alcohol Use: No     Comment: 1 bottle of wine per week   OB History   Grav Para Term Preterm Abortions TAB SAB Ect Mult Living   3 1   1  1         Review of Systems  HENT:       Headache   Musculoskeletal:       Breast pain   All other systems reviewed and are negative.    Allergies  Penicillins; Shellfish allergy; and Other  Home Medications   Current Outpatient Rx  Name  Route  Sig  Dispense  Refill  . albuterol (PROVENTIL HFA;VENTOLIN HFA) 108 (90 BASE) MCG/ACT inhaler   Inhalation   Inhale 2 puffs into the lungs every 6 (six) hours as needed for wheezing or shortness of breath.   1 Inhaler   12   . albuterol (PROVENTIL) (2.5 MG/3ML) 0.083% nebulizer solution   Nebulization   Take 3 mLs (2.5 mg total) by nebulization every 6 (six) hours as needed for wheezing or shortness of breath (for intractable shortness o fbreath or wheezing).   75 mL   12   . ibuprofen (ADVIL,MOTRIN) 200 MG tablet   Oral   Take 600 mg by mouth every 6 (six) hours as needed for pain.         Marland Kitchen ipratropium (ATROVENT HFA) 17 MCG/ACT inhaler   Inhalation   Inhale 2 puffs into the lungs every 6 (six) hours as needed for wheezing.   1 Inhaler   12   . ipratropium (ATROVENT) 0.02 % nebulizer solution  Nebulization   Take 2.5 mLs (500 mcg total) by nebulization every 6 (six) hours as needed for wheezing (For him tractable shortness of breath and/or wheezing).   75 mL   12   . tiotropium (SPIRIVA HANDIHALER) 18 MCG inhalation capsule   Inhalation   Place 1 capsule (18 mcg total) into inhaler and inhale daily.   30 capsule   12    BP 138/91  Pulse 95  Temp(Src) 98.3 F (36.8 C) (Oral)  Resp 16  SpO2 99% Physical Exam  Nursing note and vitals reviewed. Constitutional: She is oriented to person, place, and time.  Uncomfortable.   HENT:  Head: Normocephalic.  Mouth/Throat: Oropharynx is clear and moist.  Eyes: Conjunctivae and EOM are normal. Pupils are equal, round, and reactive to light.  Neck: Normal range of motion. Neck supple.  Cardiovascular: Normal rate, regular rhythm and normal heart sounds.   Pulmonary/Chest: Effort normal and breath sounds normal. No  respiratory distress. She has no wheezes. She has no rales.  + bilateral diffuse breast tenderness. Bilateral breasts are fibrocystic but there is no obvious mass on palpation.   Abdominal: Soft. Bowel sounds are normal. She exhibits no distension. There is no tenderness. There is no rebound and no guarding.  Musculoskeletal: Normal range of motion.  Neurological: She is alert and oriented to person, place, and time.  Nl strength and sensation bilaterally   Skin: Skin is warm and dry.  Psychiatric: She has a normal mood and affect. Her behavior is normal. Judgment and thought content normal.    ED Course   Procedures (including critical care time)  Labs Reviewed  CBC WITH DIFFERENTIAL - Abnormal; Notable for the following:    Hemoglobin 11.0 (*)    HCT 34.7 (*)    RDW 17.0 (*)    Platelets 484 (*)    All other components within normal limits  BASIC METABOLIC PANEL  HCG, QUANTITATIVE, PREGNANCY  POCT PREGNANCY, URINE   No results found. No diagnosis found.  MDM  Marisa [name] is a 37 y.o. female here with bilateral breast pain and headache. Headache likely migraines. No red flags requiring imaging. Breast pain can be from fibrocystic breasts. Will need to r/o pregnancy.   3:04 PM Patient not pregnant. Felt better after toradol and morphine. Will d/c home with prn reglan and percocet. She will need to get outpatient breast US and/or mammogram for further evaluation.    Richardean Canal, MD 09/15/12 [phone]

## 2012-09-15 NOTE — ED Notes (Addendum)
Pt reports breast pain x2 weeks. Reports both are swollen, cannot wear her normal bras. Reports "nipples tender and breast have a bunch of lumps to them". Pain upon slightly palpation 10/10. Decreased appetite due to pain. Denies nipple discharge.  Headache started yesterday like "someone is taking a bunch of needles to her head".   Pt has unprotected sex with husband, but pt reports difficulty getting pregnant.

## 2012-09-24 ENCOUNTER — Inpatient Hospital Stay: Payer: No Typology Code available for payment source

## 2012-09-24 ENCOUNTER — Other Ambulatory Visit: Payer: Self-pay | Admitting: Family Medicine

## 2012-09-24 ENCOUNTER — Encounter (HOSPITAL_COMMUNITY): Payer: Self-pay | Admitting: Emergency Medicine

## 2012-09-24 ENCOUNTER — Emergency Department (INDEPENDENT_AMBULATORY_CARE_PROVIDER_SITE_OTHER)
Admission: EM | Admit: 2012-09-24 | Discharge: 2012-09-24 | Disposition: A | Discharge status: Home / Self Care | Payer: No Typology Code available for payment source | Source: Home / Self Care

## 2012-09-24 DIAGNOSIS — N63 Unspecified lump in unspecified breast: Secondary | ICD-10-CM

## 2012-09-24 DIAGNOSIS — N644 Mastodynia: Secondary | ICD-10-CM

## 2012-09-24 MED ORDER — MUPIROCIN CALCIUM 2 % EX CREA: TOPICAL_CREAM | Freq: Three times a day (TID) | CUTANEOUS | Status: DC

## 2012-09-24 MED ORDER — OXYCODONE-ACETAMINOPHEN 5-325 MG PO TABS: 1.0000 | ORAL_TABLET | Freq: Four times a day (QID) | ORAL | Status: DC | PRN

## 2012-09-24 NOTE — ED Notes (Signed)
C/o rash on left rash which patient noticed three days ago which has enlarged since then.  C/o of breast lumps for a two months now which started becoming painful month.  Patient states she have abd pain which she is not able to sleep.  Patient states she does have fibroids.

## 2012-09-24 NOTE — ED Provider Notes (Signed)
Marisa [name] is a 37 y.o. female who presents to Urgent Care today for bilateral breast pain and swelling as well as rash.  Patient has had bilateral breast pain and swelling for around 2 months. She has not yet had a chance to have her formally evaluated as [facility] has not yet had an appointment. She notes is moderately painful and worse with activity better with rest. She has tender mobile nodules in both breasts. This is quite bothersome. She has tried ibuprofen which does not been effective. She was given oxycodone in the emergency room which helped a lot.   Additionally she notes a rash on the anterior chest wall between both breasts. This is been present for the last 2 or 3 days. She's tried antibiotic ointment which is only helped a little. She denies any fevers or chills nausea vomiting or diarrhea.   Additionally patient notes continued chronic pelvic pain due to fibroids. This is nothing new and not associated with constipation diarrhea vomiting or blood in the stool. She has seen her primary care provider for this issue.    PMH reviewed. Asthma History  Substance Use Topics  . Smoking status: Current Some Day Smoker -- 0.25 packs/day for 26 years    Types: Cigarettes  . Smokeless tobacco: Never Used     Comment: 1 pack per week  . Alcohol Use: No     Comment: 1 bottle of wine per week   ROS as above Medications reviewed. No current facility-administered medications for this encounter.   Current Outpatient Prescriptions  Medication Sig Dispense Refill  . albuterol (PROVENTIL HFA;VENTOLIN HFA) 108 (90 BASE) MCG/ACT inhaler Inhale 2 puffs into the lungs every 6 (six) hours as needed for wheezing or shortness of breath.  1 Inhaler  12  . albuterol (PROVENTIL) (2.5 MG/3ML) 0.083% nebulizer solution Take 3 mLs (2.5 mg total) by nebulization every 6 (six) hours as needed for wheezing or shortness of breath (for intractable shortness  o fbreath or wheezing).  75 mL  12  . ibuprofen (ADVIL,MOTRIN) 200 MG tablet Take 600 mg by mouth every 6 (six) hours as needed for pain.      Marland Kitchen ipratropium (ATROVENT HFA) 17 MCG/ACT inhaler Inhale 2 puffs into the lungs every 6 (six) hours as needed for wheezing.  1 Inhaler  12  . ipratropium (ATROVENT) 0.02 % nebulizer solution Take 2.5 mLs (500 mcg total) by nebulization every 6 (six) hours as needed for wheezing (For him tractable shortness of breath and/or wheezing).  75 mL  12  . metoCLOPramide (REGLAN) 10 MG tablet Take 1 tablet (10 mg total) by mouth every 6 (six) hours as needed (nausea/headache).  10 tablet  0  . mupirocin cream (BACTROBAN) 2 % Apply topically 3 (three) times daily.  15 g  0  . oxyCODONE-acetaminophen (PERCOCET) 5-325 MG per tablet Take 1 tablet by mouth every 6 (six) hours as needed for pain.  20 tablet  0  . tiotropium (SPIRIVA HANDIHALER) 18 MCG inhalation capsule Place 1 capsule (18 mcg total) into inhaler and inhale daily.  30 capsule  12    Exam:  BP 125/92  Pulse 92  Temp(Src) 98.7 F (37.1 C) (Oral)  Resp 18  SpO2 98%  LMP 09/22/2012 Gen: Well NAD HEENT: EOMI,  MMM. No mucocutaneous rash Breast: Bilateral breasts are normal appearing. Multiple small mobile masses that are tender consistent with fibrocystic breast changes present. No large firm adhered masses.  No  nipple discharge.  Lungs: CTABL Nl WOB Heart: RRR no MRG Abd: NABS, NT, ND Exts: Non edematous BL  LE, warm and well perfused.  Skin: Small 1 cm diameter macule area of erythema mildly tender. No pustules vesicles scale or discharge. No other rashes  No results found for this or any previous visit (from the past 24 hour(s)). No results found.  Assessment and Plan: 37 y.o. female with likely fibrocystic breast disease.  Plan to evaluate her breast pain and massive swelling with diagnostic mammogram and ultrasound at the breast Center Monday, August 11 at 3 PM.  Additionally we'll refill  oxycodone for pain medication and followup with primary care provider for this issue.   I am not certain of the diagnosis of the rash. This is possibly impetigo. Additionally this may be shingles however patient has bilateral pain in the macule on the center portion of her chest.  It is too early for definitive diagnosis. She does not appear to be ill nor have mucocutaneous involvement.  Plan for watchful waiting and treatment with mupirocin antibiotic cream.  Followup with primary care provider.  Discussed warning signs or symptoms. Please see discharge instructions. Patient expresses understanding.      Rodolph Bong, MD 09/24/12 1101

## 2012-09-27 ENCOUNTER — Inpatient Hospital Stay: Admit: 2012-09-27 | Payer: No Typology Code available for payment source

## 2012-09-27 ENCOUNTER — Ambulatory Visit
Admission: RE | Admit: 2012-09-27 | Discharge: 2012-09-27 | Disposition: A | Discharge status: Home / Self Care | Payer: No Typology Code available for payment source | Source: Ambulatory Visit | Attending: Family Medicine | Admitting: Family Medicine

## 2012-09-27 ENCOUNTER — Inpatient Hospital Stay
Admit: 2012-09-27 | Discharge: 2012-09-27 | Disposition: A | Discharge status: Home / Self Care | Payer: No Typology Code available for payment source | Attending: Family Medicine | Admitting: Family Medicine

## 2012-09-27 ENCOUNTER — Other Ambulatory Visit: Payer: Self-pay | Admitting: Family Medicine

## 2012-09-27 DIAGNOSIS — N63 Unspecified lump in unspecified breast: Secondary | ICD-10-CM

## 2012-09-27 DIAGNOSIS — N644 Mastodynia: Secondary | ICD-10-CM

## 2012-09-30 ENCOUNTER — Emergency Department (INDEPENDENT_AMBULATORY_CARE_PROVIDER_SITE_OTHER)
Admission: EM | Admit: 2012-09-30 | Discharge: 2012-09-30 | Disposition: A | Discharge status: Home / Self Care | Payer: No Typology Code available for payment source | Source: Home / Self Care | Attending: Emergency Medicine | Admitting: Emergency Medicine

## 2012-09-30 ENCOUNTER — Encounter (HOSPITAL_COMMUNITY): Payer: Self-pay | Admitting: Emergency Medicine

## 2012-09-30 DIAGNOSIS — L259 Unspecified contact dermatitis, unspecified cause: Secondary | ICD-10-CM

## 2012-09-30 DIAGNOSIS — L309 Dermatitis, unspecified: Secondary | ICD-10-CM

## 2012-09-30 MED ORDER — PREDNISONE 20 MG PO TABS: ORAL_TABLET | ORAL | Status: DC

## 2012-09-30 MED ORDER — TRIAMCINOLONE ACETONIDE 0.1 % EX CREA: TOPICAL_CREAM | Freq: Three times a day (TID) | CUTANEOUS | Status: DC

## 2012-09-30 NOTE — ED Notes (Signed)
Patient mentioned she has been on steroids since being seen in ed, finished today- patient did not mention this before, nor recorded on information sheet

## 2012-09-30 NOTE — ED Notes (Signed)
Reports rash that has been present for one month.  Patient reports being seen recently for the same and prescribed an ointment for possible shingles per patient.  Patient reports rash has spread and is painful.  Rash across chest and includes both arms, dr Lorenz Coaster in treatment room to complete evaluation

## 2012-09-30 NOTE — ED Notes (Signed)
MD at bedside. 

## 2012-09-30 NOTE — ED Provider Notes (Signed)
Chief Complaint:   Chief Complaint  Patient presents with  . Rash    History of Present Illness:   Marisa [name] is a 37 year old female with a history of asthma who has been either here or to the emergency room multiple times within the past year for various pain conditions, including migraine headache, abdominal pain, and fibroid tumors. These usually result in her being prescribed a prescription for Percocet. She presented to the emergency department about a month ago with bilateral breast pain. Pregnancy was ruled out. She was given a prescription for Percocet for pain. She was seen here about 2 weeks ago with the same symptoms plus a rash. A mammogram was scheduled which turned out to be negative and she was again given more Percocet. Right now she complains of severe pain from the rash. It began on the left breast and left her with a purplish area measuring 1-1/2 cm in diameter. She has numerous other small lesions on the face, neck, trunk, arms, and legs. She denies any lesions on the palms or soles, hands or feet, or inside the mouth. The skin lesions are very painful rated 9-02/19/08 in intensity and they itch. The patient thinks she had a fever a couple days ago but now it is down to normal. She cannot think of anything that she's come in contact with including changes in soaps, detergents, washing powders, dryer sheets, fabric softeners, cosmetics, poison ivy, dogs, cats, chemicals, or anyone with a similar rash. She denies any difficulty breathing or wheezing. She states her breasts are still painful, but she cannot feel any masses or lumps. The patient states that she has been prescribed OxyContin for the pain in the past, and it seems that she wants this again. I cannot see any prescriptions ever written for OxyContin. She has gotten many prescriptions for oxycodone.  Review of Systems:  Other than noted above, the patient denies any of the following symptoms: Systemic:  No fever, chills,  sweats, weight loss, or fatigue. ENT:  No nasal congestion, rhinorrhea, sore throat, swelling of lips, tongue or throat. Resp:  No cough, wheezing, or shortness of breath. Skin:  No rash, itching, nodules, or suspicious lesions.  PMFSH:  Past medical history, family history, social history, meds, and allergies were reviewed. She is allergic to penicillin, mushrooms, and shellfish. She takes albuterol, oxycodone, and was prescribed mupirocin cream.  Physical Exam:   Vital signs:  BP 122/89  Pulse 91  Temp(Src) 97.4 F (36.3 C) (Oral)  Resp 16  SpO2 100%  LMP 09/22/2012 Gen:  Alert, oriented, in no distress. ENT:  Pharynx clear, no intraoral lesions, moist mucous membranes. Lungs:  Clear to auscultation. Breast exam: She has a few, scattered, erythematous papules on the skin of both breasts. Nothing to suggest infectious etiology. Both breasts are extremely tender to touch. He jumps, flinches, and moans when her breasts are examined. Patrecia Pace, RN accompanied me during the breast examination. Skin:  She has many, scattered, tiny, erythematous, raised, maculopapular lesions on the face, trunk, breasts, arms, and legs, but sparing the hands, feet, palms, soles, and no lesions were noted inside the mouth. These are very tiny measuring only millimeters or 2 in diameter. They are very widely scattered, for instance on the face she only has one lesion. They're extremely tender to touch, the patient jumps and flinches with even light touch to any of these lesions. The degree of pain appears out of proportion to the physical findings. She has an oval, purplish  patch on the medial left breast which she states was the first such lesion. This is also tender to touch.  Assessment:  The encounter diagnosis was Dermatitis.  She has a very minimal dermatitis involving the entire body. This is not for his impressive. It could be psoriasis, dermatitis herpetiformis, folliculitis, or a number of other  inflammatory or allergic dermatitis problems. She will need to see dermatology for further evaluation of this rash. Meanwhile, her degree of pain seems way out of proportion to the degree of physical findings. I'm not willing to prescribe any further Percocet for her pain. I have a suspicion that her pain may be more due to fibromyalgia that is due to the rash. She is a patient at [facility] and [employer], and I urged her to followup there.  Plan:   1.  The following meds were prescribed:   Discharge Medication List as of 09/30/2012  1:47 PM    START taking these medications   Details  predniSONE (DELTASONE) 20 MG tablet Take 3 daily for 5 days, 2 daily for 5 days, 1 daily for 5 days., Normal    triamcinolone cream (KENALOG) 0.1 % Apply topically 3 (three) times daily., Starting 09/30/2012, Until Discontinued, Normal       2.  The patient was instructed in symptomatic care and handouts were given. 3.  The patient was told to return if becoming worse in any way, if no better in 3 or 4 days, and given some red flag symptoms such as fever or difficulty breathing that would indicate earlier return. 4.  Follow up with Dr. Para Skeans for the skin rash, and at [facility] for her other medical problems.     Reuben Likes, MD 09/30/12 [phone]

## 2012-10-04 ENCOUNTER — Emergency Department (HOSPITAL_COMMUNITY)
Admission: EM | Admit: 2012-10-04 | Discharge: 2012-10-04 | Disposition: A | Discharge status: Home / Self Care | Payer: No Typology Code available for payment source | Attending: Emergency Medicine | Admitting: Emergency Medicine

## 2012-10-04 ENCOUNTER — Encounter (HOSPITAL_COMMUNITY): Payer: Self-pay | Admitting: Emergency Medicine

## 2012-10-04 DIAGNOSIS — Z85850 Personal history of malignant neoplasm of thyroid: Secondary | ICD-10-CM | POA: Insufficient documentation

## 2012-10-04 DIAGNOSIS — J45909 Unspecified asthma, uncomplicated: Secondary | ICD-10-CM | POA: Insufficient documentation

## 2012-10-04 DIAGNOSIS — Z79899 Other long term (current) drug therapy: Secondary | ICD-10-CM | POA: Insufficient documentation

## 2012-10-04 DIAGNOSIS — R21 Rash and other nonspecific skin eruption: Secondary | ICD-10-CM | POA: Insufficient documentation

## 2012-10-04 DIAGNOSIS — L299 Pruritus, unspecified: Secondary | ICD-10-CM | POA: Insufficient documentation

## 2012-10-04 DIAGNOSIS — Z8543 Personal history of malignant neoplasm of ovary: Secondary | ICD-10-CM | POA: Insufficient documentation

## 2012-10-04 DIAGNOSIS — Z880 Allergy status to penicillin: Secondary | ICD-10-CM | POA: Insufficient documentation

## 2012-10-04 DIAGNOSIS — Z8679 Personal history of other diseases of the circulatory system: Secondary | ICD-10-CM | POA: Insufficient documentation

## 2012-10-04 DIAGNOSIS — F17200 Nicotine dependence, unspecified, uncomplicated: Secondary | ICD-10-CM | POA: Insufficient documentation

## 2012-10-04 MED ORDER — HYDROXYZINE HCL 10 MG PO TABS: 10.0000 mg | ORAL_TABLET | Freq: Three times a day (TID) | ORAL | Status: DC | PRN

## 2012-10-04 MED ORDER — HYDROCODONE-ACETAMINOPHEN 5-325 MG PO TABS
2.0000 | ORAL_TABLET | Freq: Once | ORAL | Status: AC
  Administered 2012-10-04: 2 via ORAL
  Filled 2012-10-04: qty 2

## 2012-10-04 MED ORDER — ONDANSETRON 8 MG PO TBDP
8.0000 mg | ORAL_TABLET | Freq: Once | ORAL | Status: AC
  Administered 2012-10-04: 8 mg via ORAL
  Filled 2012-10-04: qty 1

## 2012-10-04 MED ORDER — PROMETHAZINE HCL 25 MG PO TABS: 25.0000 mg | ORAL_TABLET | Freq: Four times a day (QID) | ORAL | Status: DC | PRN

## 2012-10-04 MED ORDER — ERYTHROMYCIN BASE 250 MG PO TABS: 250.0000 mg | ORAL_TABLET | Freq: Four times a day (QID) | ORAL | Status: DC

## 2012-10-04 MED ORDER — HYDROCODONE-ACETAMINOPHEN 5-325 MG PO TABS: 2.0000 | ORAL_TABLET | Freq: Four times a day (QID) | ORAL | Status: DC | PRN

## 2012-10-04 NOTE — Progress Notes (Signed)
1454 [facility] ED CM received a call from pt's spouse, Minerva Areola from [phone] stating they left [facility] ED with Rx for antibiotic that [facility] on [address] [place] states will cost >$300 and pt can not afford medication. Pt is listed as self pay [place] resident in [place]. Spouse states pharmacy will not fill any of the prescriptions including the antibiotic. CM reviewed EPIC notes and chart review information Pt is eligible for [facility] MATCH program (unable to find pt listed in PDMI per cardholder name inquiry)  Cm spoke with Alice at [phone] who confirms pt came in pharmacy. Confirmed pharmacy fax number as [phone] PDMI information entered. MATCH letter completed and faxed with fax confirmation received 10/04/12 at 1527  CM spoke with the pt's spouse about [facility] MATCH program ($3 co pay for each Rx through [facility] program, 7 day expiration of MATCH letter and choice of pharmacies) Pt's spouse appreciative of services rendered

## 2012-10-04 NOTE — ED Provider Notes (Signed)
CSN: [id number]     Arrival date & time 10/04/12  1200 History     First MD Initiated Contact with Patient 10/04/12 1218     Chief Complaint  Patient presents with  . Rash   (Consider location/radiation/quality/duration/timing/severity/associated sxs/prior Treatment) HPI Comments: Patient is a 37 year old female who presents today with 2 weeks of gradually worsening rash. The rash began in one spot medial to her left breast. It has gradually spread throughout her entire body sparing her face. She states the rash is very itchy and painful. The rash has a sharp pins and needles sensation. She has been unable to find relief with cortisone cream. No one else has this rash. No new exposures. She's been evaluated for this rash in the past and has been referred to dermatology. She has an appointment with a dermatologist who plans to biopsy this rash. She denies fever, chills, Headache, nausea, vomiting, abdominal pain, shortness of breath, chest pain, numbness, weakness.  The history is provided by the patient. No language interpreter was used.    Past Medical History  Diagnosis Date  . Asthma   . EP (ectopic pregnancy)   . Ovarian tumor   . Tumor, thyroid   . Sleep apnea   . Chronic headaches   . Irregular heartbeat    Past Surgical History  Procedure Laterality Date  . Cholecystectomy  2005  . Oophorectomy  2009    left   Family History  Problem Relation Age of Onset  . Hypertension Mother   . Diabetes Mother   . Cancer Father   . Hyperlipidemia Father   . Hypertension Father   . Allergies Mother   . Heart disease Mother   . Heart disease Maternal Grandmother    History  Substance Use Topics  . Smoking status: Current Some Day Smoker -- 0.25 packs/day for 26 years    Types: Cigarettes  . Smokeless tobacco: Never Used     Comment: 1 pack per week  . Alcohol Use: No     Comment: 1 bottle of wine per week   OB History   Grav Para Term Preterm Abortions TAB SAB Ect Mult  Living   3 1   1  1         Review of Systems  Constitutional: Negative for fever and chills.  HENT: Negative for neck pain and neck stiffness.   Respiratory: Negative for shortness of breath.   Gastrointestinal: Negative for vomiting and abdominal pain.  Skin: Positive for rash.  Neurological: Negative for weakness.  All other systems reviewed and are negative.    Allergies  Penicillins; Shellfish allergy; and Other  Home Medications   Current Outpatient Rx  Name  Route  Sig  Dispense  Refill  . cetirizine (ZYRTEC) 10 MG tablet   Oral   Take 10 mg by mouth daily.         . metoCLOPramide (REGLAN) 10 MG tablet   Oral   Take 1 tablet (10 mg total) by mouth every 6 (six) hours as needed (nausea/headache).   10 tablet   0   . mupirocin cream (BACTROBAN) 2 %   Topical   Apply topically 3 (three) times daily.   15 g   0   . triamcinolone cream (KENALOG) 0.1 %   Topical   Apply topically 3 (three) times daily.   454 g   0    BP 136/92  Pulse 99  Temp(Src) 98.7 F (37.1 C) (Oral)  Resp 18  SpO2 100%  LMP 09/22/2012 Physical Exam  Nursing note and vitals reviewed. Constitutional: She is oriented to person, place, and time. She appears well-developed and well-nourished. No distress.  HENT:  Head: Normocephalic and atraumatic.  Right Ear: External ear normal.  Left Ear: External ear normal.  Nose: Nose normal.  Mouth/Throat: Oropharynx is clear and moist.  Eyes: Conjunctivae are normal.  Neck: Normal range of motion.  Cardiovascular: Normal rate, regular rhythm and normal heart sounds.   Pulmonary/Chest: Effort normal and breath sounds normal. No stridor. No respiratory distress. She has no wheezes. She has no rales.  Abdominal: Soft. She exhibits no distension.  Musculoskeletal: Normal range of motion.  Neurological: She is alert and oriented to person, place, and time. She has normal strength.  Skin: Skin is warm and dry. Rash noted. She is not  diaphoretic. No erythema.  3 cm area of purplish discoloration medial to left breast. It does not blanch to palpation and is not elevated. Multiple papules diffusely over her body, sparing face.  Psychiatric: She has a normal mood and affect. Her behavior is normal.    ED Course   Procedures (including critical care time)  Labs Reviewed - No data to display No results found. 1. Rash     MDM  Patient evaluated for a rash. The rash is consistent with pityriasis rosea. She is on Kenalog cream already. She was given Atarax for itching. Discussed pityriasis rosea is generally a self limited condition, but she need to see her dermatologist. Discussed case with Dr. Lynelle Doctor who agrees with plan. Return instructions given. Vital signs stable for discharge. Patient / Family / Caregiver informed of clinical course, understand medical decision-making process, and agree with plan.   Mora Bellman, PA-C 10/05/12 1301

## 2012-10-04 NOTE — Progress Notes (Signed)
                  Dear _________[name]__________________:  Bonita Quin have been approved to have your discharge prescriptions filled through our [facility] (Medication Assistance Through [facility]) program. This program allows for a one-time (no refills) 34-day supply of selected medications for a low copay amount.  The copay is $3.00 per prescription. For instance, if you have one prescription, you will pay $3.00; for two prescriptions, you pay $6.00; for three prescriptions, you pay $9.00; and so on.  Only certain pharmacies are participating in this program with [facility]. You will need to select one of the pharmacies from the attached list and take your prescriptions, this letter, and your photo ID to one of the participating pharmacies.   We are excited that you are able to use the [facility] program to get your medications. These prescriptions must be filled within 7 days of hospital discharge or they will no longer be valid for the [facility] program. Should you have any problems with your prescriptions please contact your case management team member at [phone].  Thank you,   [facility]   Participating [facility]  [facility]   [facility] [id number] [place], [place]   [facility] [address] [place], [place]   [facility] [address] [place], [place]   [facility]   [address] [place], [place]   [address] [place], [place], [place]   [address], [place], [place]   [address] [place], [place]   [address], [place], [place]   [address], [place], [place]   [address] [place], [place]   [address] [place], [place]   [address] [place], [place]   [name] [place], [place], [place]  [facility]   [address], [place], [place]   [address] [place] [place],  [place]   [address] [place], [place], [place]   [address] [place], [place]   [address], [place], [place]   [address] [place] [address] [place], [place], [place] [facility]   [address] [place], [place]   [address] [employer], [place], [place]   [address] [place], [place]   [address], [place], [place]   [address], [place], [place]   [address], [place], [place]   [address], [place], [place]   [address], [place], [place]   [address], [place], [place]   [address], [place], [place]   [address], [place], [place]   [address], [place], [place]   [address], [place], [place]   [name] [place], [place], [place]  Independent Pharmacies   [facility] [address] [place] [place], [place]   [facility] [address] [place], [place]   [facility] [address] [place], [place]   For continued medication needs, please contact the  [facility] Department at  [phone].

## 2012-10-04 NOTE — ED Notes (Signed)
Patient states that she was seen and treated about 2 -3 weeks ago for the same, however the skin breakout is getting worse to the point that it is unbearably painful. She is waiting to be seen by dermatologist

## 2012-10-05 NOTE — ED Provider Notes (Signed)
Medical screening examination/treatment/procedure(s) were performed by non-physician practitioner and as supervising physician I was immediately available for consultation/collaboration.    Shakea Isip R Sicily Zaragoza, MD 10/05/12 1542 

## 2012-10-07 ENCOUNTER — Ambulatory Visit: Payer: No Typology Code available for payment source | Attending: Family Medicine | Admitting: Internal Medicine

## 2012-10-07 ENCOUNTER — Encounter: Payer: Self-pay | Admitting: Internal Medicine

## 2012-10-07 VITALS — BP 129/88 | HR 97 | Temp 98.9°F | Resp 16 | Ht 65.0 in | Wt 226.0 lb

## 2012-10-07 DIAGNOSIS — R21 Rash and other nonspecific skin eruption: Secondary | ICD-10-CM

## 2012-10-07 MED ORDER — PREDNISONE 20 MG PO TABS: 40.0000 mg | ORAL_TABLET | Freq: Every day | ORAL | Status: AC

## 2012-10-07 NOTE — Progress Notes (Unsigned)
Patient ID: Marisa [name], female   DOB: Jul 10, 1975, 37 y.o.   MRN: [id number]  CC:  HPI: 37 year old female who presents with a history of rash for the last 3 weeks.The rash began in one spot medial to her left breast. It has gradually spread throughout her entire body sparing her face. She states the rash is very itchy and painful. The rash has a sharp pins and needles sensation. She has been unable to find relief with cortisone cream She has received prednisone and hydroxyzine for the rash with no relief. She she was referred by the ED to dermatology for a skin biopsy She cannot afford $85 co-pay She is also requesting Percocet for pain relief    Allergies  Allergen Reactions  . Penicillins Anaphylaxis  . Shellfish Allergy Anaphylaxis  . Other     Mushroom-- swelling throat, eyes Animals-- swelling throat, eyes   Past Medical History  Diagnosis Date  . Asthma   . EP (ectopic pregnancy)   . Ovarian tumor   . Tumor, thyroid   . Sleep apnea   . Chronic headaches   . Irregular heartbeat    Current Outpatient Prescriptions on File Prior to Visit  Medication Sig Dispense Refill  . cetirizine (ZYRTEC) 10 MG tablet Take 10 mg by mouth daily.      Marland Kitchen erythromycin (E-MYCIN) 250 MG tablet Take 1 tablet (250 mg total) by mouth every 6 (six) hours.  56 tablet  0  . HYDROcodone-acetaminophen (NORCO/VICODIN) 5-325 MG per tablet Take 2 tablets by mouth every 6 (six) hours as needed for pain.  12 tablet  0  . hydrOXYzine (ATARAX/VISTARIL) 10 MG tablet Take 1 tablet (10 mg total) by mouth 3 (three) times daily as needed for itching.  30 tablet  0  . triamcinolone cream (KENALOG) 0.1 % Apply topically 3 (three) times daily.  454 g  0  . metoCLOPramide (REGLAN) 10 MG tablet Take 1 tablet (10 mg total) by mouth every 6 (six) hours as needed (nausea/headache).  10 tablet  0  . mupirocin cream (BACTROBAN) 2 % Apply topically 3 (three) times daily.  15 g  0  . promethazine (PHENERGAN) 25 MG tablet  Take 1 tablet (25 mg total) by mouth every 6 (six) hours as needed for nausea.  12 tablet  0   No current facility-administered medications on file prior to visit.   Family History  Problem Relation Age of Onset  . Hypertension Mother   . Diabetes Mother   . Cancer Father   . Hyperlipidemia Father   . Hypertension Father   . Allergies Mother   . Heart disease Mother   . Heart disease Maternal Grandmother    History   Social History  . Marital Status: Married    Spouse Name: N/A    Number of Children: 1  . Years of Education: N/A   Occupational History  . SITTER    Social History Main Topics  . Smoking status: Current Some Day Smoker -- 0.25 packs/day for 26 years    Types: Cigarettes  . Smokeless tobacco: Never Used     Comment: 1 pack per week  . Alcohol Use: No     Comment: 1 bottle of wine per week  . Drug Use: No  . Sexual Activity: Yes    Birth Control/ Protection: None   Other Topics Concern  . Not on file   Social History Narrative  . No narrative on file    Review of Systems  Constitutional: Negative for fever, chills, diaphoresis, activity change, appetite change and fatigue.  HENT: Negative for ear pain, nosebleeds, congestion, facial swelling, rhinorrhea, neck pain, neck stiffness and ear discharge.   Eyes: Negative for pain, discharge, redness, itching and visual disturbance.  Respiratory: Negative for cough, choking, chest tightness, shortness of breath, wheezing and stridor.   Cardiovascular: Negative for chest pain, palpitations and leg swelling.  Gastrointestinal: Negative for abdominal distention.  Genitourinary: Negative for dysuria, urgency, frequency, hematuria, flank pain, decreased urine volume, difficulty urinating and dyspareunia.  Musculoskeletal: Negative for back pain, joint swelling, arthralgias and gait problem.  Neurological: Negative for dizziness, tremors, seizures, syncope, facial asymmetry, speech difficulty, weakness,  light-headedness, numbness and headaches.  Hematological: Negative for adenopathy. Does not bruise/bleed easily.  Psychiatric/Behavioral: Negative for hallucinations, behavioral problems, confusion, dysphoric mood, decreased concentration and agitation.    Objective:   Filed Vitals:   10/07/12 1243  BP: 129/88  Pulse: 97  Temp: 98.9 F (37.2 C)  Resp: 16    Physical Exam  Constitutional: Appears well-developed and well-nourished. No distress.  HENT: Normocephalic. External right and left ear normal. Oropharynx is clear and moist.  Eyes: Conjunctivae and EOM are normal. PERRLA, no scleral icterus.  Neck: Normal ROM. Neck supple. No JVD. No tracheal deviation. No thyromegaly.  CVS: RRR, S1/S2 +, no murmurs, no gallops, no carotid bruit.  Pulmonary: Effort and breath sounds normal, no stridor, rhonchi, wheezes, rales.  Abdominal: Soft. BS +,  no distension, tenderness, rebound or guarding.  Musculoskeletal: Normal range of motion. No edema and no tenderness.  Lymphadenopathy: No lymphadenopathy noted, cervical, inguinal. Neuro: Alert. Normal reflexes, muscle tone coordination. No cranial nerve deficit. Skin: Skin is warm and dry. No rash noted. Not diaphoretic. No erythema. No pallor.  Psychiatric: Normal mood and affect. Behavior, judgment, thought content normal.   Lab Results  Component Value Date   WBC 6.5 09/15/2012   HGB 11.0* 09/15/2012   HCT 34.7* 09/15/2012   MCV 85.9 09/15/2012   PLT 484* 09/15/2012   Lab Results  Component Value Date   CREATININE 0.63 09/15/2012   BUN 7 09/15/2012   NA 137 09/15/2012   K 3.8 09/15/2012   CL 102 09/15/2012   CO2 23 09/15/2012    No results found for this basename: HGBA1C   Lipid Panel  No results found for this basename: chol, trig, hdl, cholhdl, vldl, ldlcalc       Assessment and plan:   Patient Active Problem List   Diagnosis Date Noted  . Dyspnea 09/07/2012  . Extrinsic asthma, unspecified 07/30/2012  . Anemia 07/30/2012   . Active smoker 07/30/2012   Rash most likely consistent with pityriasis rosea     Have told the patient that an urgent dermatology referral will be provided. Hopefully we will get her in to see somebody for a skin biopsy in the next one to 2 days. Prescribe prednisone 40 mg daily for 7 more days Patient needs to have a skin biopsy, Explain to her that going to the ER, on coming to the clinic here will probably not be too helpful in arriving at the diagnosis unless we have the biopsy results However if she develops fever, and notices extensive exfoliation of her skin, she needs to come to the ER

## 2012-10-07 NOTE — Progress Notes (Unsigned)
PT HERE WITH C/O SEVERE ITCHY, RED RASH THAT STARTED 3 WEEKS AGO UNDER BOTH BREAST, THEN FOLLOWED BY SMALL RED,RAISED BUMPS. PT WAS SEEN IN ER AND [facility] PRESCRIBED STEROID CREAMS/ATB'S AND SX WORSENING. PT WAS REFERRED TO DERMATOLOGY URGENT BUT DUE TO ORANGE CARD, UNABLE TO GET APPT FOR . NO OOZING SEEN

## 2012-10-11 ENCOUNTER — Ambulatory Visit (INDEPENDENT_AMBULATORY_CARE_PROVIDER_SITE_OTHER): Payer: No Typology Code available for payment source | Admitting: Obstetrics & Gynecology

## 2012-10-11 ENCOUNTER — Other Ambulatory Visit: Payer: Self-pay | Admitting: Dermatology

## 2012-10-11 ENCOUNTER — Other Ambulatory Visit (HOSPITAL_COMMUNITY)
Admission: RE | Admit: 2012-10-11 | Discharge: 2012-10-11 | Disposition: A | Discharge status: Home / Self Care | Payer: No Typology Code available for payment source | Source: Ambulatory Visit | Attending: Obstetrics & Gynecology | Admitting: Obstetrics & Gynecology

## 2012-10-11 ENCOUNTER — Encounter: Payer: Self-pay | Admitting: Obstetrics & Gynecology

## 2012-10-11 VITALS — BP 133/98 | HR 98 | Ht 65.0 in | Wt 223.0 lb

## 2012-10-11 DIAGNOSIS — N926 Irregular menstruation, unspecified: Secondary | ICD-10-CM

## 2012-10-11 DIAGNOSIS — D259 Leiomyoma of uterus, unspecified: Secondary | ICD-10-CM | POA: Insufficient documentation

## 2012-10-11 DIAGNOSIS — N949 Unspecified condition associated with female genital organs and menstrual cycle: Secondary | ICD-10-CM | POA: Insufficient documentation

## 2012-10-11 DIAGNOSIS — N939 Abnormal uterine and vaginal bleeding, unspecified: Secondary | ICD-10-CM | POA: Insufficient documentation

## 2012-10-11 DIAGNOSIS — D219 Benign neoplasm of connective and other soft tissue, unspecified: Secondary | ICD-10-CM | POA: Insufficient documentation

## 2012-10-11 DIAGNOSIS — G8929 Other chronic pain: Secondary | ICD-10-CM

## 2012-10-11 HISTORY — DX: Abnormal uterine and vaginal bleeding, unspecified: N93.9

## 2012-10-11 LAB — POCT PREGNANCY, URINE: Preg Test, Ur: NEGATIVE

## 2012-10-11 MED ORDER — MEGESTROL ACETATE 40 MG PO TABS: 40.0000 mg | ORAL_TABLET | Freq: Two times a day (BID) | ORAL | Status: DC

## 2012-10-11 MED ORDER — MELOXICAM 15 MG PO TABS: 15.0000 mg | ORAL_TABLET | Freq: Every day | ORAL | Status: DC

## 2012-10-11 NOTE — Patient Instructions (Signed)
Uterine Fibroid A uterine fibroid is a growth (tumor) that occurs in a woman's uterus. This type of tumor is not cancerous and does not spread out of the uterus. A woman can have one or many fibroids, and the fiboid(s) can become quite large. A fibroid can vary in size, weight, and where it grows in the uterus. Most fibroids do not require medical treatment, but some can cause pain or heavy bleeding during and between periods. CAUSES  A fibroid is the result of a single uterine cell that keeps growing (unregulated), which is different than most cells in the human body. Most cells have a control mechanism that keeps them from reproducing without control.  SYMPTOMS   Bleeding.  Pelvic pain and pressure.  Bladder problems due to the size of the fibroid.  Infertility and miscarriages depending on the size and location of the fibroid. DIAGNOSIS  A diagnosis is made by physical exam. Your caregiver may feel the lumpy tumors during a pelvic exam. Important information regarding size, location, and number of tumors can be gained by having an ultrasound. It is rare that other tests, such as a CT scan or MRI, are needed. TREATMENT   Your caregiver may recommend watchful waiting. This involves getting the fibroid checked by your caregiver to see if the fibroids grow or shrink.   Hormonal treatment or an intrauterine device (IUD) may be prescribed.   Surgery may be needed to remove the fibroids (myomectomy) or the uterus (hysterectomy). This depends on your situation. When fibroids interfere with fertility and a woman wants to become pregnant, a caregiver may recommend having the fibroids removed.  HOME CARE INSTRUCTIONS  Home care depends on how you were treated. In general:   Keep all follow-up appointments with your caregiver.   Only take medicine as told by your caregiver. Do not take aspirin. It can cause bleeding.   If you have excessive periods and soak tampons or pads in a half hour or  less, contact your caregiver immediately. If your periods are troublesome but not so heavy, lie down with your feet raised slightly above your heart. Place cold packs on your lower abdomen.   If your periods are heavy, write down the number of pads or tampons you use per month. Bring this information to your caregiver.   Talk to your caregiver about taking iron pills.   Include green vegetables in your diet.   If you were prescribed a hormonal treatment, take the hormonal medicines as directed.   If you need surgery, ask your caregiver for information on your specific surgery.  SEEK IMMEDIATE MEDICAL CARE IF:  You have pelvic pain or cramps not controlled with medicines.   You have a sudden increase in pelvic pain.   You have an increase of bleeding between and during periods.   You feel lightheaded or have fainting episodes.  MAKE SURE YOU:  Understand these instructions.  Will watch your condition.  Will get help right away if you are not doing well or get worse. Document Released: 02/01/2000 Document Revised: 04/28/2011 Document Reviewed: 02/24/2011 [facility] Patient Information 2014 [place], [place].  Myomectomy Myoma is a non-cancerous tumor made up of fibrous tissue. It is also called leiomyoma, but more often called a fibroid tumor. Myomectomy is the removal of a fibroid tumor without removing another organ, like the uterus or ovary, with it. Fibroids range from the size of a pea to a grapefruit. They are rarely cancerous. Myomas only need treatment when they are growing  or when they cause symptoms, such aspain, pressure, bleeding, and pain with intercourse. LET YOUR CAREGIVER KNOW ABOUT:  Any allergies, especially to medicines.  If you develop a cold or an infection before your surgery.  Medicines taken, including vitamins, herbs, eyedrops, over-ther-counter medicines, and creams.  Use of steroids (by mouth or creams).  Previous problems with numbing  medicines.  History of blood clots or other bleeding problems.  Other health problems, such as diabetes, kidney, heart, or lung problems.  Previous surgery.  Possibility of pregnancy, if this applies. RISKS AND COMPLICATIONS   Excessive bleeding.  Infection.  Injury to other organs.  Blood clots in the legs, chest, and brain.  Scar tissue (adhesions) on other organs and in the pelvis.  Death during or after the surgery. BEFORE THE PROCEDURE  Follow your caregiver's advice regarding your surgery and preparing for surgery.  Avoid taking aspirin or blood thinners as directed by your caregiver.  DO NOT eat or drink anything after midnight on the night before surgery, or as directed by your caregiver.  DO NOT smoke (if you smoke) for 2 weeks before the surgery.  DO NOT drink alcohol the day before the surgery.  If you are admitted the day of the surgery,arrive1 hour before your surgery is scheduled.  Arrange to have someone take you home from the hospital.  Arrange to have someone care for you when you go home. PROCEDURE There are several ways to perform a myomectomy:  Hysteroscopy myomectomy. A lighted tube is inserted inside the uterus. The tube will remove the fibroid. This is used when the fibroid is inside the cavity of the uterus.  Laparoscopic myomectomy. A long, lighted tube is inserted through 2 or 3 small incisions to see the organs in the pelvis. The fibroid is removed.  Myomectomy through a sugical cut (incicion) in the abdomen. The fibroid is removed through an incision made in the stomach. This way is performed when thethe fibroid cannot be removed with a hysteroscope or laprascope. AFTER THE PROCEDURE  If you had laparoscopic or hysteroscopic myomectomy, you may go home the same day or stay overnight.  If you had abdominal myomectomy, you may stay in the hospital a few days.  Your intravenous (IV)access tube and catheter will be removed in 1 or 2  days.  If you stay in the hospital, your caregiver will order pain medicine and a sleeping pill, if needed.  You may be placed on an antibiotic medicine, if needed.  You may be given written instructions and medicines before you are sent home. Document Released: 12/01/2006 Document Revised: 04/28/2011 Document Reviewed: 12/13/2008 [facility] Patient Information 2014 [place], [place].

## 2012-10-11 NOTE — Progress Notes (Signed)
GYNECOLOGY CLINIC PROGRESS NOTE  History:  37 y.o. [id number] here today for evaluation of chronic pelvic pain, fibroids and AUB.  She reports her periods last 7-8 days and are very heavy.  She says she has" to wear diapers, instead of pads".  Her periods sometimes happens twice in one month.  The bleeding is associated with severe pain.  Of note, patient has ben seen multiple times by other specialists and ED providers for pain complaints and has received a lot of narcotic prescriptions; please refer to her other notes for more details.  She denies any other GYN concerns.  The following portions of the patient's history were reviewed and updated as appropriate: allergies, current medications, past family history, past medical history, past social history, past surgical history and problem list.  Review of Systems:  Pertinent items are noted in HPI.  Objective:  Physical Exam BP 133/98  Pulse 98  Ht 5\' 5"  (1.651 m)  Wt 223 lb (101.152 kg)  BMI 37.11 kg/m2  LMP 09/22/2012 Gen: NAD Abd: Soft, nontender and nondistended Pelvic: Normal appearing external genitalia; normal appearing vaginal mucosa and cervix.  Normal discharge.  Unable to palpate uterus fully secondary to habitus. No uterine or adnexal tenderness  ENDOMETRIAL BIOPSY     The indications for endometrial biopsy were reviewed.   Risks of the biopsy including cramping, bleeding, infection, uterine perforation, inadequate specimen and need for additional procedures  were discussed. The patient states she understands and agrees to undergo procedure today. Consent was signed. Time out was performed. Urine HCG was negative. During the pelvic exam, the cervix was prepped with Betadine. A single-toothed tenaculum was placed on the anterior lip of the cervix to stabilize it. The 3 mm pipelle was introduced into the endometrial cavity to a depth of 6 cm; I was unable to advance the pipelle any further secondary to obstruction by the fibroid.  Two  passes were made and a moderate amount of tissue was obtained and sent to pathology. The instruments were removed from the patient's vagina. Minimal bleeding from the cervix was noted. The patient tolerated the procedure well. Routine post-procedure instructions were given to the patient.    Labs and Imaging 06/22/2012  TRANSABDOMINAL AND TRANSVAGINAL ULTRASOUND OF PELVIS DOPPLER ULTRASOUND OF OVARIES Clinical Data: Severe right pelvic pain Comparison: CT same date, pelvic ultrasound 06/14/2012 Findings: Uterus: Anteverted, anteflexed. 10.7 x 7.3 x 5.9 cm. Focal mass- like enlargement at the posterior uterine body is again noted measuring 5.4 x 4.0 x 3.9 cm. This produces anterior displacement of the endometrial stripe. Endometrium: 6 mm. Normal. Right ovary: 3.0 x 2.7 x 1.8 cm. Normal. Left ovary: 2.2 x 2.6 x 1.7 cm. Normal. Pulsed Doppler evaluation demonstrates normal low-resistance arterial and venous waveforms in both ovaries. IMPRESSION: No acute abnormality. Stable mass-like thickening of the posterior mid uterine body myometrium which could represent an underlying fibroid versus focal adenomyosis. No sonographic evidence for ovarian torsion.   Assessment & Plan:  Will follow up endometrial biopsy.  Meloxicam prescribed for pain, Megace prescribed as needed for heavy bleeding. Pain and bleeding precautions reviewed.   Patient is interested in myomectomy, wants future childbearing. 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 Patient educational materials were printed for her to review.  She will return in three weeks for further discussion about management.

## 2012-10-11 NOTE — Progress Notes (Signed)
Has fibroids. Periods last 7-8 days very heavy has to wear diapers, instead of pads. Sometimes has two periods per month.  Pain is very sever when she is on her period.

## 2012-10-13 ENCOUNTER — Inpatient Hospital Stay (HOSPITAL_COMMUNITY): Payer: No Typology Code available for payment source

## 2012-10-13 ENCOUNTER — Encounter (HOSPITAL_COMMUNITY): Payer: Self-pay | Admitting: Advanced Practice Midwife

## 2012-10-13 ENCOUNTER — Inpatient Hospital Stay (HOSPITAL_COMMUNITY)
Admission: AD | Admit: 2012-10-13 | Discharge: 2012-10-13 | Disposition: A | Discharge status: Home / Self Care | Payer: No Typology Code available for payment source | Source: Ambulatory Visit | Attending: Obstetrics & Gynecology | Admitting: Obstetrics & Gynecology

## 2012-10-13 DIAGNOSIS — N710 Acute inflammatory disease of uterus: Secondary | ICD-10-CM | POA: Insufficient documentation

## 2012-10-13 DIAGNOSIS — R109 Unspecified abdominal pain: Secondary | ICD-10-CM | POA: Insufficient documentation

## 2012-10-13 LAB — WET PREP, GENITAL
Clue Cells Wet Prep HPF POC: NONE SEEN
Trich, Wet Prep: NONE SEEN
Yeast Wet Prep HPF POC: NONE SEEN

## 2012-10-13 LAB — URINE MICROSCOPIC-ADD ON

## 2012-10-13 LAB — CBC WITH DIFFERENTIAL/PLATELET
Basophils Absolute: 0 10*3/uL (ref 0.0–0.1)
Basophils Relative: 0 % (ref 0–1)
Eosinophils Absolute: 0 10*3/uL (ref 0.0–0.7)
Eosinophils Relative: 0 % (ref 0–5)
HCT: 31.7 % — ABNORMAL LOW (ref 36.0–46.0)
Hemoglobin: 10.3 g/dL — ABNORMAL LOW (ref 12.0–15.0)
Lymphocytes Relative: 29 % (ref 12–46)
Lymphs Abs: 3.6 10*3/uL (ref 0.7–4.0)
MCH: 27.1 pg (ref 26.0–34.0)
MCHC: 32.5 g/dL (ref 30.0–36.0)
MCV: 83.4 fL (ref 78.0–100.0)
Monocytes Absolute: 0.7 10*3/uL (ref 0.1–1.0)
Monocytes Relative: 6 % (ref 3–12)
Neutro Abs: 7.9 10*3/uL — ABNORMAL HIGH (ref 1.7–7.7)
Neutrophils Relative %: 65 % (ref 43–77)
Platelets: 555 10*3/uL — ABNORMAL HIGH (ref 150–400)
RBC: 3.8 MIL/uL — ABNORMAL LOW (ref 3.87–5.11)
RDW: 16 % — ABNORMAL HIGH (ref 11.5–15.5)
WBC: 12.2 10*3/uL — ABNORMAL HIGH (ref 4.0–10.5)

## 2012-10-13 LAB — URINALYSIS, ROUTINE W REFLEX MICROSCOPIC
Bilirubin Urine: NEGATIVE
Glucose, UA: NEGATIVE mg/dL
Ketones, ur: 15 mg/dL — AB
Leukocytes, UA: NEGATIVE
Nitrite: NEGATIVE
Protein, ur: NEGATIVE mg/dL
Specific Gravity, Urine: 1.015 (ref 1.005–1.030)
Urobilinogen, UA: 0.2 mg/dL (ref 0.0–1.0)
pH: 6.5 (ref 5.0–8.0)

## 2012-10-13 MED ORDER — METRONIDAZOLE 500 MG PO TABS
500.0000 mg | ORAL_TABLET | Freq: Once | ORAL | Status: AC
  Administered 2012-10-13: 500 mg via ORAL
  Filled 2012-10-13: qty 1

## 2012-10-13 MED ORDER — CIPROFLOXACIN HCL 500 MG PO TABS: 500.0000 mg | ORAL_TABLET | Freq: Two times a day (BID) | ORAL | Status: DC

## 2012-10-13 MED ORDER — OXYCODONE-ACETAMINOPHEN 5-325 MG PO TABS: 2.0000 | ORAL_TABLET | Freq: Once | ORAL | Status: DC

## 2012-10-13 MED ORDER — HYDROMORPHONE HCL PF 2 MG/ML IJ SOLN
2.0000 mg | Freq: Once | INTRAMUSCULAR | Status: AC
  Administered 2012-10-13: 2 mg via INTRAVENOUS
  Filled 2012-10-13: qty 1

## 2012-10-13 MED ORDER — CIPROFLOXACIN HCL 500 MG PO TABS
500.0000 mg | ORAL_TABLET | Freq: Once | ORAL | Status: AC
  Administered 2012-10-13: 500 mg via ORAL
  Filled 2012-10-13: qty 1

## 2012-10-13 MED ORDER — LACTATED RINGERS IV SOLN
INTRAVENOUS | Status: DC
  Administered 2012-10-13: 06:00:00 via INTRAVENOUS

## 2012-10-13 MED ORDER — OXYCODONE-ACETAMINOPHEN 5-325 MG PO TABS: 1.0000 | ORAL_TABLET | ORAL | Status: DC | PRN

## 2012-10-13 MED ORDER — HYDROMORPHONE HCL 2 MG PO TABS
2.0000 mg | ORAL_TABLET | Freq: Once | ORAL | Status: AC
  Administered 2012-10-13: 2 mg via ORAL
  Filled 2012-10-13: qty 1

## 2012-10-13 MED ORDER — METRONIDAZOLE 500 MG PO TABS: 500.0000 mg | ORAL_TABLET | Freq: Two times a day (BID) | ORAL | Status: DC

## 2012-10-13 NOTE — MAU Note (Signed)
Pt reports an increased depressed feelings since the pelvic pain 1-77yrs.  Has not been able to talk to anyone due to finances.

## 2012-10-13 NOTE — MAU Provider Note (Signed)
Chief Complaint: Post-op Problem   First Provider Initiated Contact with Patient 10/13/12 0528     SUBJECTIVE HPI: Marisa [name] is a 37 y.o. [id number] female who presents to MAU by EMS with with worsening low abd pain since EBX 10/11/2012 and gush of blood and clots at 0400. States she regular periods w/ menorrhagia and dysmenorrhea, but per note from 10/11/2012 pt sometimes has two periods per month. LMP 09/22/2012 or 09/27/2012. Pt unsure.   Past Medical History  Diagnosis Date  . Asthma   . EP (ectopic pregnancy)   . Ovarian tumor   . Tumor, thyroid   . Sleep apnea   . Chronic headaches   . Irregular heartbeat   . Chronic female pelvic pain 10/11/2012  . Abnormal uterine bleeding (AUB) 10/11/2012   OB History  Gravida Para Term Preterm AB SAB TAB Ectopic Multiple Living  3 1   2  1 1  1     # Outcome Date GA Lbr Len/2nd Weight Sex Delivery Anes PTL Lv  3 PAR           2 ECT           1 TAB              Past Surgical History  Procedure Laterality Date  . Cholecystectomy  2005  . Oophorectomy  2009    left   History   Social History  . Marital Status: Married    Spouse Name: N/A    Number of Children: 1  . Years of Education: N/A   Occupational History  . SITTER    Social History Main Topics  . Smoking status: Current Some Day Smoker -- 0.25 packs/day for 26 years    Types: Cigarettes  . Smokeless tobacco: Never Used     Comment: 1 pack per week  . Alcohol Use: No     Comment: 1 bottle of wine per week  . Drug Use: No  . Sexual Activity: Yes    Birth Control/ Protection: None   Other Topics Concern  . Not on file   Social History Narrative  . No narrative on file   No current facility-administered medications on file prior to encounter.   Current Outpatient Prescriptions on File Prior to Encounter  Medication Sig Dispense Refill  . cetirizine (ZYRTEC) 10 MG tablet Take 10 mg by mouth daily.      Marland Kitchen erythromycin (E-MYCIN) 250 MG tablet Take 1 tablet (250 mg  total) by mouth every 6 (six) hours.  56 tablet  0  . HYDROcodone-acetaminophen (NORCO/VICODIN) 5-325 MG per tablet Take 2 tablets by mouth every 6 (six) hours as needed for pain.  12 tablet  0  . hydrOXYzine (ATARAX/VISTARIL) 10 MG tablet Take 1 tablet (10 mg total) by mouth 3 (three) times daily as needed for itching.  30 tablet  0  . megestrol (MEGACE) 40 MG tablet Take 1 tablet (40 mg total) by mouth 2 (two) times daily. Can increase dosage to three times daily if needed  30 tablet  3  . meloxicam (MOBIC) 15 MG tablet Take 1 tablet (15 mg total) by mouth daily.  30 tablet  4  . metoCLOPramide (REGLAN) 10 MG tablet Take 1 tablet (10 mg total) by mouth every 6 (six) hours as needed (nausea/headache).  10 tablet  0  . mupirocin cream (BACTROBAN) 2 % Apply topically 3 (three) times daily.  15 g  0  . predniSONE (DELTASONE) 20 MG tablet Take 2 tablets (  40 mg total) by mouth daily.  14 tablet  2  . promethazine (PHENERGAN) 25 MG tablet Take 1 tablet (25 mg total) by mouth every 6 (six) hours as needed for nausea.  12 tablet  0  . triamcinolone cream (KENALOG) 0.1 % Apply topically 3 (three) times daily.  454 g  0   Allergies  Allergen Reactions  . Penicillins Anaphylaxis  . Shellfish Allergy Anaphylaxis  . Other     Mushroom-- swelling throat, eyes Animals-- swelling throat, eyes    ROS: Positive for low abdominal pain, vaginal bleeding, dizziness. Neg for fever, chills, nausea, vomiting, diarrhea, constipation, urinary complaints, syncope shortness of breath or wheezing.  OBJECTIVE Blood pressure 128/70, pulse 107, temperature 98 F (36.7 C), temperature source Oral, resp. rate 22, last menstrual period 09/22/2012. GENERAL: Well-developed, well-nourished female in severe distress, writhing.  HEENT: Normocephalic HEART: Mild tachycardia RESP: normal effort, breathing rapidly. ABDOMEN: Soft, non-tender EXTREMITIES: Nontender, no edema NEURO: Alert and oriented SPECULUM EXAM: NEFG, small  amount of dark red blood noted, cervix clean BIMANUAL: cervix closed; uterus normal size, pos bilat adnexal tenderness, R>L. No masses. Pos CMT.  LAB RESULTS Results for orders placed during the hospital encounter of 10/13/12 (from the past 24 hour(s))  CBC WITH DIFFERENTIAL     Status: Abnormal   Collection Time    10/13/12  5:25 AM      Result Value Range   WBC 12.2 (*) 4.0 - 10.5 K/uL   RBC 3.80 (*) 3.87 - 5.11 MIL/uL   Hemoglobin 10.3 (*) 12.0 - 15.0 g/dL   HCT 46.9 (*) 62.9 - 52.8 %   MCV 83.4  78.0 - 100.0 fL   MCH 27.1  26.0 - 34.0 pg   MCHC 32.5  30.0 - 36.0 g/dL   RDW 41.3 (*) 24.4 - 01.0 %   Platelets 555 (*) 150 - 400 K/uL   Neutrophils Relative % 65  43 - 77 %   Neutro Abs 7.9 (*) 1.7 - 7.7 K/uL   Lymphocytes Relative 29  12 - 46 %   Lymphs Abs 3.6  0.7 - 4.0 K/uL   Monocytes Relative 6  3 - 12 %   Monocytes Absolute 0.7  0.1 - 1.0 K/uL   Eosinophils Relative 0  0 - 5 %   Eosinophils Absolute 0.0  0.0 - 0.7 K/uL   Basophils Relative 0  0 - 1 %   Basophils Absolute 0.0  0.0 - 0.1 K/uL  URINALYSIS, ROUTINE W REFLEX MICROSCOPIC     Status: Abnormal   Collection Time    10/13/12  6:00 AM      Result Value Range   Color, Urine YELLOW  YELLOW   APPearance CLEAR  CLEAR   Specific Gravity, Urine 1.015  1.005 - 1.030   pH 6.5  5.0 - 8.0   Glucose, UA NEGATIVE  NEGATIVE mg/dL   Hgb urine dipstick LARGE (*) NEGATIVE   Bilirubin Urine NEGATIVE  NEGATIVE   Ketones, ur 15 (*) NEGATIVE mg/dL   Protein, ur NEGATIVE  NEGATIVE mg/dL   Urobilinogen, UA 0.2  0.0 - 1.0 mg/dL   Nitrite NEGATIVE  NEGATIVE   Leukocytes, UA NEGATIVE  NEGATIVE  URINE MICROSCOPIC-ADD ON     Status: None   Collection Time    10/13/12  6:00 AM      Result Value Range   Squamous Epithelial / LPF RARE  RARE   WBC, UA 0-2  <3 WBC/hpf   RBC / HPF 21-50  <  3 RBC/hpf   Bacteria, UA RARE  RARE   Urine-Other MUCOUS PRESENT    WET PREP, GENITAL     Status: Abnormal   Collection Time    10/13/12  6:40 AM       Result Value Range   Yeast Wet Prep HPF POC NONE SEEN  NONE SEEN   Trich, Wet Prep NONE SEEN  NONE SEEN   Clue Cells Wet Prep HPF POC NONE SEEN  NONE SEEN   WBC, Wet Prep HPF POC FEW (*) NONE SEEN    IMAGING US Transvaginal Non-ob  10/13/2012   *RADIOLOGY REPORT*  Clinical Data: Severe pain 2 days following endometrial biopsy. Previous surgery:  Left oophorectomy.  TRANSABDOMINAL AND TRANSVAGINAL ULTRASOUND OF PELVIS Technique:  Both transabdominal and transvaginal ultrasound examinations of the pelvis were performed. Transabdominal technique was performed for global imaging of the pelvis including uterus, ovaries, adnexal regions, and pelvic cul-de-sac.  It was necessary to proceed with endovaginal exam following the transabdominal exam to visualize the endometrium and adnexa.  Comparison:  03/25/2012  Findings:  Uterus: The uterus measures 9.9 x 6.5 x 7.7 cm.  Dominant fibroid versus focal adenomyosis along the posterior myometrium centered intramural, measuring 4.4 x 4.1 x 4.5 cm.  Endometrium: Measures up to 11 mm in thickness and hyperemic along the lower uterine segment.  Right ovary:  Normal appearance/no adnexal mass, measuring 3.5 x 2.1 x 1.8 cm.  Left ovary: Surgically absent.  Other findings: No free fluid  IMPRESSION: Hyperemia of the endometrium within the lower uterine segment may reflect reactive/inflammatory change or infection in the appropriate clinical setting. No endoluminal fluid or debris.  Posterior intramural fibroid versus focal adenomyosis measuring 4.5 cm.   Original Report Authenticated By: Jearld Lesch, M.D.   US Pelvis Complete  10/13/2012   *RADIOLOGY REPORT*  Clinical Data: Severe pain 2 days following endometrial biopsy. Previous surgery:  Left oophorectomy.  TRANSABDOMINAL AND TRANSVAGINAL ULTRASOUND OF PELVIS Technique:  Both transabdominal and transvaginal ultrasound examinations of the pelvis were performed. Transabdominal technique was performed for global  imaging of the pelvis including uterus, ovaries, adnexal regions, and pelvic cul-de-sac.  It was necessary to proceed with endovaginal exam following the transabdominal exam to visualize the endometrium and adnexa.  Comparison:  03/25/2012  Findings:  Uterus: The uterus measures 9.9 x 6.5 x 7.7 cm.  Dominant fibroid versus focal adenomyosis along the posterior myometrium centered intramural, measuring 4.4 x 4.1 x 4.5 cm.  Endometrium: Measures up to 11 mm in thickness and hyperemic along the lower uterine segment.  Right ovary:  Normal appearance/no adnexal mass, measuring 3.5 x 2.1 x 1.8 cm.  Left ovary: Surgically absent.  Other findings: No free fluid  IMPRESSION: Hyperemia of the endometrium within the lower uterine segment may reflect reactive/inflammatory change or infection in the appropriate clinical setting. No endoluminal fluid or debris.  Posterior intramural fibroid versus focal adenomyosis measuring 4.5 cm.   Original Report Authenticated By: Jearld Lesch, M.D.    MAU COURSE IV started and Dilaudid given for pain. Bedside ultrasound performed.  1610: Pain improved. Pt calm. Per consult w/ Dr. Penne Lash findings C/ endometritis. Will Tx w/ Cipro and Flagyl x 1 week. First doses given.   ASSESSMENT 1. Acute endometritis    PLAN Discharge home in stable condition.  Follow-up Information   Follow up with [facility] In 1 week.   Specialty:  Obstetrics and Gynecology   Contact information:   [address] [place]  [address] [phone]      Follow up with [facility]. (As needed for fever >100.4, worsening pain. )    Contact information:   [address] [id number] [place] [address] [phone]       Medication List         cetirizine 10 MG tablet  Commonly known as:  ZYRTEC  Take 10 mg by mouth daily.     ciprofloxacin 500 MG tablet  Commonly known as:  CIPRO  Take 1 tablet (500 mg total)  by mouth 2 (two) times daily.     erythromycin 250 MG tablet  Commonly known as:  E-MYCIN  Take 250 mg by mouth every 6 (six) hours.     HYDROcodone-acetaminophen 5-325 MG per tablet  Commonly known as:  NORCO/VICODIN  Take 2 tablets by mouth every 6 (six) hours as needed for pain.     hydrOXYzine 10 MG tablet  Commonly known as:  ATARAX/VISTARIL  Take 1 tablet (10 mg total) by mouth 3 (three) times daily as needed for itching.     megestrol 40 MG tablet  Commonly known as:  MEGACE  Take 1 tablet (40 mg total) by mouth 2 (two) times daily. Can increase dosage to three times daily if needed     meloxicam 15 MG tablet  Commonly known as:  MOBIC  Take 1 tablet (15 mg total) by mouth daily.     metoCLOPramide 10 MG tablet  Commonly known as:  REGLAN  Take 1 tablet (10 mg total) by mouth every 6 (six) hours as needed (nausea/headache).     metroNIDAZOLE 500 MG tablet  Commonly known as:  FLAGYL  Take 1 tablet (500 mg total) by mouth 2 (two) times daily.     mupirocin cream 2 %  Commonly known as:  BACTROBAN  Apply topically 3 (three) times daily.     oxyCODONE-acetaminophen 5-325 MG per tablet  Commonly known as:  PERCOCET/ROXICET  Take 1-2 tablets by mouth every 4 (four) hours as needed for pain.     predniSONE 20 MG tablet  Commonly known as:  DELTASONE  Take 2 tablets (40 mg total) by mouth daily.     promethazine 25 MG tablet  Commonly known as:  PHENERGAN  Take 1 tablet (25 mg total) by mouth every 6 (six) hours as needed for nausea.     triamcinolone cream 0.1 %  Commonly known as:  KENALOG  Apply topically 3 (three) times daily.       [place], CNM 10/13/2012  8:59 AM

## 2012-10-13 NOTE — MAU Note (Signed)
Pt post uterine biopsy on 8/25, lower abd pain since 1800, around 0400 got up to bathroom had a gush of blood and pain became very intense.

## 2012-10-13 NOTE — MAU Note (Signed)
US at the bedside

## 2012-10-14 ENCOUNTER — Telehealth: Payer: Self-pay | Admitting: General Practice

## 2012-10-14 LAB — GC/CHLAMYDIA PROBE AMP
CT Probe RNA: NEGATIVE
GC Probe RNA: NEGATIVE

## 2012-10-14 NOTE — ED Notes (Signed)
Dr. Denyse Amass asked me to [facility]. Order faxed and confirmation received. Marisa [name] 10/14/2012

## 2012-10-14 NOTE — Telephone Encounter (Signed)
Called patient and informed her of results and next appt. Patient was satisfied, verbalized understanding to all and had no further questions

## 2012-10-14 NOTE — Telephone Encounter (Signed)
Message copied by Kathee Delton on Thu Oct 14, 2012 11:31 AM ------      Message from: Jaynie Collins A      Created: Thu Oct 14, 2012  7:52 AM       Benign endometrial biopsy. Please call to inform patient of results. ------

## 2012-10-14 NOTE — MAU Provider Note (Signed)
Attestation of Attending Supervision of Advanced Practitioner (CNM/NP): Evaluation and management procedures were performed by the Advanced Practitioner under my supervision and collaboration. I have reviewed the Advanced Practitioner's note and chart, and I agree with the management and plan.  Evaline Waltman H. 9:27 PM

## 2012-10-21 ENCOUNTER — Ambulatory Visit (INDEPENDENT_AMBULATORY_CARE_PROVIDER_SITE_OTHER): Payer: No Typology Code available for payment source | Admitting: Obstetrics & Gynecology

## 2012-10-21 ENCOUNTER — Encounter: Payer: Self-pay | Admitting: Obstetrics & Gynecology

## 2012-10-21 VITALS — BP 130/89 | HR 96 | Temp 98.1°F | Ht 65.0 in | Wt 223.3 lb

## 2012-10-21 DIAGNOSIS — N926 Irregular menstruation, unspecified: Secondary | ICD-10-CM

## 2012-10-21 DIAGNOSIS — R0609 Other forms of dyspnea: Secondary | ICD-10-CM

## 2012-10-21 DIAGNOSIS — N939 Abnormal uterine and vaginal bleeding, unspecified: Secondary | ICD-10-CM

## 2012-10-21 DIAGNOSIS — R0600 Dyspnea, unspecified: Secondary | ICD-10-CM

## 2012-10-21 DIAGNOSIS — D259 Leiomyoma of uterus, unspecified: Secondary | ICD-10-CM

## 2012-10-21 DIAGNOSIS — D219 Benign neoplasm of connective and other soft tissue, unspecified: Secondary | ICD-10-CM

## 2012-10-21 NOTE — Patient Instructions (Addendum)
Myomectomy Myoma is a non-cancerous tumor made up of fibrous tissue. It is also called leiomyoma, but more often called a fibroid tumor. Myomectomy is the removal of a fibroid tumor without removing another organ, like the uterus or ovary, with it. Fibroids range from the size of a pea to a grapefruit. They are rarely cancerous. Myomas only need treatment when they are growing or when they cause symptoms, such aspain, pressure, bleeding, and pain with intercourse. LET YOUR CAREGIVER KNOW ABOUT:  Any allergies, especially to medicines.  If you develop a cold or an infection before your surgery.  Medicines taken, including vitamins, herbs, eyedrops, over-ther-counter medicines, and creams.  Use of steroids (by mouth or creams).  Previous problems with numbing medicines.  History of blood clots or other bleeding problems.  Other health problems, such as diabetes, kidney, heart, or lung problems.  Previous surgery.  Possibility of pregnancy, if this applies. RISKS AND COMPLICATIONS   Excessive bleeding.  Infection.  Injury to other organs.  Blood clots in the legs, chest, and brain.  Scar tissue (adhesions) on other organs and in the pelvis.  Death during or after the surgery. BEFORE THE PROCEDURE  Follow your caregiver's advice regarding your surgery and preparing for surgery.  Avoid taking aspirin or blood thinners as directed by your caregiver.  DO NOT eat or drink anything after midnight on the night before surgery, or as directed by your caregiver.  DO NOT smoke (if you smoke) for 2 weeks before the surgery.  DO NOT drink alcohol the day before the surgery.  If you are admitted the day of the surgery,arrive1 hour before your surgery is scheduled.  Arrange to have someone take you home from the hospital.  Arrange to have someone care for you when you go home. PROCEDURE There are several ways to perform a myomectomy:  Hysteroscopy myomectomy. A lighted tube is  inserted inside the uterus. The tube will remove the fibroid. This is used when the fibroid is inside the cavity of the uterus.  Laparoscopic Myomectomy. A long, lighted tube is inserted through 2 or 3 small incisions to see the organs in the pelvis. The fibroid is removed.  Abdominal Myomectomy through a surgical cut (incision) in the abdomen. The fibroid is removed through an incision made in the stomach. This way is performed when the fibroid cannot be removed with a hysteroscope or laprascope. AFTER THE PROCEDURE  If you had laparoscopic or hysteroscopic myomectomy, you may go home the same day or stay overnight.  If you had abdominal myomectomy, you may stay in the hospital a few days.  Your intravenous (IV)access tube and catheter will be removed in 1 or 2 days.  If you stay in the hospital, your caregiver will order pain medicine and a sleeping pill, if needed.  You may be placed on an antibiotic medicine, if needed.  You may be given written instructions and medicines before you are sent home. Document Released: 12/01/2006 Document Revised: 04/28/2011 Document Reviewed: 12/13/2008 [facility] Patient Information 2014 [place], [place].

## 2012-10-22 NOTE — Progress Notes (Signed)
GYNECOLOGY CLINIC ENCOUNTER NOTE  History:  37 y.o. [id number] here today for follow up after endometrial biopsy on 10/11/12 with subsequent heavy bleeding and endometritis that required a MAU visit on on 10/13/12.   She denies any further heavy bleeding or other concerns.    The following portions of the patient's history were reviewed and updated as appropriate: allergies, current medications, past family history, past medical history, past social history, past surgical history and problem list.  Past Medical History  Diagnosis Date  . Asthma   . EP (ectopic pregnancy)   . Ovarian tumor   . Tumor, thyroid   . Sleep apnea   . Chronic headaches   . Irregular heartbeat   . Chronic female pelvic pain 10/11/2012  . Abnormal uterine bleeding (AUB) 10/11/2012    Review of Systems:  Pertinent items are noted in HPI.  Objective:  BP 130/89  Pulse 96  Temp(Src) 98.1 F (36.7 C) (Oral)  Ht 5\' 5"  (1.651 m)  Wt 223 lb 4.8 oz (101.288 kg)  BMI 37.16 kg/m2  LMP 09/22/2012 Physical Exam deferred  Labs and Imaging 10/11/2012  Endometrium Biopsy: BENIGN POLYPOID SECRETORY PATTERN ENDOMETRIUM.  NO ATYPIA OR MALIGNANCY IDENTIFIED.  10/13/2012   TRANSABDOMINAL AND TRANSVAGINAL ULTRASOUND OF PELVIS Clinical Data: Severe pain 2 days following endometrial biopsy. Previous surgery:  Left oophorectomy.  Comparison:  03/25/2012  Findings:  Uterus: The uterus measures 9.9 x 6.5 x 7.7 cm.  Dominant fibroid versus focal adenomyosis along the posterior myometrium centered intramural, measuring 4.4 x 4.1 x 4.5 cm.  Endometrium: Measures up to 11 mm in thickness and hyperemic along the lower uterine segment.  Right ovary:  Normal appearance/no adnexal mass, measuring 3.5 x 2.1 x 1.8 cm.  Left ovary: Surgically absent.  Other findings: No free fluid  IMPRESSION: Hyperemia of the endometrium within the lower uterine segment may reflect reactive/inflammatory change or infection in the appropriate clinical setting. No  endoluminal fluid or debris.  Posterior intramural fibroid versus focal adenomyosis measuring 4.5 cm.    06/22/2012  TRANSABDOMINAL AND TRANSVAGINAL ULTRASOUND OF PELVIS DOPPLER ULTRASOUND OF OVARIES Clinical Data: Severe right pelvic pain Comparison: CT same date, pelvic ultrasound 06/14/2012 Findings: Uterus: Anteverted, anteflexed. 10.7 x 7.3 x 5.9 cm. Focal mass-like enlargement at the posterior uterine body is again noted measuring 5.4 x 4.0 x 3.9 cm. This produces anterior displacement of the endometrial stripe. Endometrium: 6 mm. Normal. Right ovary: 3.0 x 2.7 x 1.8 cm. Normal. Left ovary: 2.2 x 2.6 x 1.7 cm. Normal. Pulsed Doppler evaluation demonstrates normal low-resistance arterial and venous waveforms in both ovaries. IMPRESSION: No acute abnormality. Stable mass-like thickening of the posterior mid uterine body myometrium which could represent an underlying fibroid versus focal adenomyosis. No sonographic evidence for ovarian torsion.    Assessment & Plan:  Discussed surgical management options with patient including myomectomy or hysterectomy.  Patient wants future childbearing, does not want medical treatment with hormones in any forms, does not want a hysterectomy.  Given the location and size of her fibroid, she was offered an abdominal myomectomy.  It was emphasized that this procedure does carry a risk of possible hysterectomy.  Other risks of surgery were discussed with the patient including but not limited to: bleeding which may require transfusion or reoperation and hysterectomy; infection which may require antibiotics; injury to bowel, bladder, ureters or other surrounding organs; need for additional procedures; thromboembolic phenomenon, incisional problems and other postoperative/anesthesia complications.  Also discussed need for cesarean deliveries at 60 -  [redacted] weeks gestation for subsequent pregnancies given the intramural nature of this fibroid and risk of posterior uterine rupture.   The patient also understands the alternative treatment options which were discussed in full. All questions were answered.  She was told that she will be contacted by our surgical scheduler regarding the time and date of her surgery. She was told she may be called for a preoperative appointment about a week prior to surgery and will be given further preoperative instructions at that visit.  Given her history of asthma, sleep apnea and irregular heartbeat, she will need medical clearance.  Printed patient education handouts about the procedure were given to the patient to review at home. Bleeding and pain precautions reviewed.

## 2012-10-25 ENCOUNTER — Telehealth: Payer: Self-pay | Admitting: Internal Medicine

## 2012-10-25 ENCOUNTER — Encounter: Payer: Self-pay | Admitting: *Deleted

## 2012-10-25 NOTE — Telephone Encounter (Signed)
Dr. Ernestina Patches needs confirmation from Doctor to schedule surgery as soon as possible

## 2012-11-03 NOTE — Telephone Encounter (Signed)
Dr. [name] at [facility] needs confirmation that pt can proceed with emergency surgery in October.  Please f/u with Dr. Laurina Bustle at [facility] at [phone].

## 2012-11-15 ENCOUNTER — Ambulatory Visit: Payer: No Typology Code available for payment source | Admitting: Obstetrics and Gynecology

## 2012-11-22 ENCOUNTER — Inpatient Hospital Stay: Admit: 2012-11-22 | Payer: Self-pay | Admitting: Obstetrics & Gynecology

## 2012-11-22 SURGERY — MYOMECTOMY, ABDOMINAL APPROACH
Anesthesia: Choice | Site: Abdomen

## 2012-11-29 ENCOUNTER — Inpatient Hospital Stay (HOSPITAL_COMMUNITY)
Admission: AD | Admit: 2012-11-29 | Discharge: 2012-11-30 | Disposition: A | Discharge status: Other Acute Inpatient Hospital | Payer: No Typology Code available for payment source | Source: Ambulatory Visit | Attending: Obstetrics and Gynecology | Admitting: Obstetrics and Gynecology

## 2012-11-29 ENCOUNTER — Emergency Department (HOSPITAL_COMMUNITY): Payer: No Typology Code available for payment source

## 2012-11-29 ENCOUNTER — Encounter (HOSPITAL_COMMUNITY): Payer: Self-pay | Admitting: Emergency Medicine

## 2012-11-29 DIAGNOSIS — J45909 Unspecified asthma, uncomplicated: Secondary | ICD-10-CM | POA: Insufficient documentation

## 2012-11-29 DIAGNOSIS — Z8669 Personal history of other diseases of the nervous system and sense organs: Secondary | ICD-10-CM | POA: Insufficient documentation

## 2012-11-29 DIAGNOSIS — Z79899 Other long term (current) drug therapy: Secondary | ICD-10-CM | POA: Insufficient documentation

## 2012-11-29 DIAGNOSIS — D259 Leiomyoma of uterus, unspecified: Secondary | ICD-10-CM | POA: Insufficient documentation

## 2012-11-29 DIAGNOSIS — Z8639 Personal history of other endocrine, nutritional and metabolic disease: Secondary | ICD-10-CM | POA: Insufficient documentation

## 2012-11-29 DIAGNOSIS — Z862 Personal history of diseases of the blood and blood-forming organs and certain disorders involving the immune mechanism: Secondary | ICD-10-CM | POA: Insufficient documentation

## 2012-11-29 DIAGNOSIS — Z9079 Acquired absence of other genital organ(s): Secondary | ICD-10-CM | POA: Insufficient documentation

## 2012-11-29 DIAGNOSIS — Z8679 Personal history of other diseases of the circulatory system: Secondary | ICD-10-CM | POA: Insufficient documentation

## 2012-11-29 DIAGNOSIS — E876 Hypokalemia: Secondary | ICD-10-CM

## 2012-11-29 DIAGNOSIS — Z87891 Personal history of nicotine dependence: Secondary | ICD-10-CM | POA: Insufficient documentation

## 2012-11-29 DIAGNOSIS — N920 Excessive and frequent menstruation with regular cycle: Secondary | ICD-10-CM | POA: Insufficient documentation

## 2012-11-29 DIAGNOSIS — N10 Acute tubulo-interstitial nephritis: Secondary | ICD-10-CM

## 2012-11-29 DIAGNOSIS — D219 Benign neoplasm of connective and other soft tissue, unspecified: Secondary | ICD-10-CM

## 2012-11-29 DIAGNOSIS — Z3202 Encounter for pregnancy test, result negative: Secondary | ICD-10-CM | POA: Insufficient documentation

## 2012-11-29 DIAGNOSIS — Z880 Allergy status to penicillin: Secondary | ICD-10-CM | POA: Insufficient documentation

## 2012-11-29 LAB — CBC
HCT: 28.4 % — ABNORMAL LOW (ref 36.0–46.0)
Hemoglobin: 9.2 g/dL — ABNORMAL LOW (ref 12.0–15.0)
MCH: 27.6 pg (ref 26.0–34.0)
MCHC: 32.4 g/dL (ref 30.0–36.0)
MCV: 85.3 fL (ref 78.0–100.0)
Platelets: 372 10*3/uL (ref 150–400)
RBC: 3.33 MIL/uL — ABNORMAL LOW (ref 3.87–5.11)
RDW: 15.7 % — ABNORMAL HIGH (ref 11.5–15.5)
WBC: 5.7 10*3/uL (ref 4.0–10.5)

## 2012-11-29 LAB — CBC WITH DIFFERENTIAL/PLATELET
Basophils Absolute: 0.1 10*3/uL (ref 0.0–0.1)
Basophils Relative: 1 % (ref 0–1)
Eosinophils Absolute: 0.1 10*3/uL (ref 0.0–0.7)
Eosinophils Relative: 1 % (ref 0–5)
HCT: 28.9 % — ABNORMAL LOW (ref 36.0–46.0)
Hemoglobin: 9.6 g/dL — ABNORMAL LOW (ref 12.0–15.0)
Lymphocytes Relative: 35 % (ref 12–46)
Lymphs Abs: 2.2 10*3/uL (ref 0.7–4.0)
MCH: 28.2 pg (ref 26.0–34.0)
MCHC: 33.2 g/dL (ref 30.0–36.0)
MCV: 85 fL (ref 78.0–100.0)
Monocytes Absolute: 0.3 10*3/uL (ref 0.1–1.0)
Monocytes Relative: 6 % (ref 3–12)
Neutro Abs: 3.5 10*3/uL (ref 1.7–7.7)
Neutrophils Relative %: 57 % (ref 43–77)
Platelets: 382 10*3/uL (ref 150–400)
RBC: 3.4 MIL/uL — ABNORMAL LOW (ref 3.87–5.11)
RDW: 15.5 % (ref 11.5–15.5)
WBC: 6.2 10*3/uL (ref 4.0–10.5)

## 2012-11-29 LAB — BASIC METABOLIC PANEL
BUN: 6 mg/dL (ref 6–23)
CO2: 17 mEq/L — ABNORMAL LOW (ref 19–32)
Calcium: 8.4 mg/dL (ref 8.4–10.5)
Chloride: 101 mEq/L (ref 96–112)
Creatinine, Ser: 0.56 mg/dL (ref 0.50–1.10)
GFR calc Af Amer: >90 mL/min (ref >90)
GFR calc non Af Amer: >90 mL/min (ref >90)
Glucose, Bld: 94 mg/dL (ref 70–99)
Potassium: 3.2 mEq/L — ABNORMAL LOW (ref 3.5–5.1)
Sodium: 134 mEq/L — ABNORMAL LOW (ref 135–145)

## 2012-11-29 LAB — URINALYSIS, ROUTINE W REFLEX MICROSCOPIC
Bilirubin Urine: NEGATIVE
Glucose, UA: NEGATIVE mg/dL
Ketones, ur: 15 mg/dL — AB
Nitrite: NEGATIVE
Protein, ur: 100 mg/dL — AB
Specific Gravity, Urine: 1.013 (ref 1.005–1.030)
Urobilinogen, UA: 0.2 mg/dL (ref 0.0–1.0)
pH: 5.5 (ref 5.0–8.0)

## 2012-11-29 LAB — COMPREHENSIVE METABOLIC PANEL
ALT: 19 U/L (ref 0–35)
AST: 18 U/L (ref 0–37)
Albumin: 2.7 g/dL — ABNORMAL LOW (ref 3.5–5.2)
Alkaline Phosphatase: 58 U/L (ref 39–117)
BUN: 5 mg/dL — ABNORMAL LOW (ref 6–23)
CO2: 21 mEq/L (ref 19–32)
Calcium: 7.7 mg/dL — ABNORMAL LOW (ref 8.4–10.5)
Chloride: 108 mEq/L (ref 96–112)
Creatinine, Ser: 0.56 mg/dL (ref 0.50–1.10)
GFR calc Af Amer: >90 mL/min (ref >90)
GFR calc non Af Amer: >90 mL/min (ref >90)
Glucose, Bld: 100 mg/dL — ABNORMAL HIGH (ref 70–99)
Potassium: 4.1 mEq/L (ref 3.5–5.1)
Sodium: 138 mEq/L (ref 135–145)
Total Bilirubin: 0.1 mg/dL — ABNORMAL LOW (ref 0.3–1.2)
Total Protein: 6 g/dL (ref 6.0–8.3)

## 2012-11-29 LAB — URINE MICROSCOPIC-ADD ON

## 2012-11-29 LAB — WET PREP, GENITAL
Clue Cells Wet Prep HPF POC: NONE SEEN
Trich, Wet Prep: NONE SEEN
WBC, Wet Prep HPF POC: NONE SEEN
Yeast Wet Prep HPF POC: NONE SEEN

## 2012-11-29 LAB — POCT PREGNANCY, URINE: Preg Test, Ur: NEGATIVE

## 2012-11-29 MED ORDER — MORPHINE SULFATE 4 MG/ML IJ SOLN
4.0000 mg | Freq: Once | INTRAMUSCULAR | Status: AC
Start: 2012-11-29 — End: 2012-11-29
  Administered 2012-11-29: 4 mg via INTRAVENOUS
  Filled 2012-11-29: qty 1

## 2012-11-29 MED ORDER — SODIUM CHLORIDE 0.9 % IV BOLUS (SEPSIS)
1000.0000 mL | Freq: Once | INTRAVENOUS | Status: AC
  Administered 2012-11-29: 1000 mL via INTRAVENOUS

## 2012-11-29 MED ORDER — HYDROMORPHONE HCL PF 1 MG/ML IJ SOLN
1.0000 mg | Freq: Once | INTRAMUSCULAR | Status: AC
  Administered 2012-11-29: 1 mg via INTRAVENOUS
  Filled 2012-11-29: qty 1

## 2012-11-29 MED ORDER — HYDROMORPHONE HCL PF 2 MG/ML IJ SOLN
2.0000 mg | INTRAMUSCULAR | Status: DC | PRN
  Administered 2012-11-29: 2 mg via INTRAVENOUS
  Filled 2012-11-29: qty 1

## 2012-11-29 MED ORDER — ONDANSETRON HCL 4 MG/2ML IJ SOLN
4.0000 mg | Freq: Once | INTRAMUSCULAR | Status: AC
  Administered 2012-11-29: 4 mg via INTRAVENOUS
  Filled 2012-11-29: qty 2

## 2012-11-29 MED ORDER — POTASSIUM CHLORIDE CRYS ER 20 MEQ PO TBCR
40.0000 meq | EXTENDED_RELEASE_TABLET | Freq: Once | ORAL | Status: AC
  Administered 2012-11-29: 40 meq via ORAL
  Filled 2012-11-29: qty 2

## 2012-11-29 NOTE — MAU Provider Note (Signed)
Chief Complaint: Vaginal Bleeding  First Provider Initiated Contact with Patient 11/29/12 2226     SUBJECTIVE HPI: Marisa [name] is a 37 y.o. [id number] non-pregnant female who was transported to MAU from [facility] for management of menorrhagia and low abd pain. Hx of similar Sx in past. Has myomectomy scheduled 01/06/2013 w/ Dr. [name]. Hgb 9.6 at 1800. 10.3 at previous MAU visit 10/13/2012. K 3.2 at 1800. Given 40 mEq KCl. See ED note.   Given Morphine 4 mg and Dilaudid 1 mg w/ partial relief. Pain has returned.   Past Medical History  Diagnosis Date  . Asthma   . EP (ectopic pregnancy)   . Ovarian tumor   . Tumor, thyroid   . Sleep apnea   . Chronic headaches   . Irregular heartbeat   . Chronic female pelvic pain 10/11/2012  . Abnormal uterine bleeding (AUB) 10/11/2012   OB History  Gravida Para Term Preterm AB SAB TAB Ectopic Multiple Living  3 1 1  2  1 1  1     # Outcome Date GA Lbr Len/2nd Weight Sex Delivery Anes PTL Lv  3 TRM      SVD     2 ECT           1 TAB              Past Surgical History  Procedure Laterality Date  . Cholecystectomy  2005  . Unilateral salpingectomy  2009    Abdominal left salpingectomy   History   Social History  . Marital Status: Married    Spouse Name: N/A    Number of Children: 1  . Years of Education: N/A   Occupational History  . SITTER    Social History Main Topics  . Smoking status: Former Smoker -- 0.25 packs/day for 26 years    Types: Cigarettes    Quit date: 09/12/2012  . Smokeless tobacco: Never Used     Comment: 1 pack per week  . Alcohol Use: No     Comment: 1 bottle of wine per week  . Drug Use: No  . Sexual Activity: Yes    Birth Control/ Protection: None   Other Topics Concern  . Not on file   Social History Narrative  . No narrative on file   No current facility-administered medications on file prior to encounter.   Current Outpatient Prescriptions on File Prior to Encounter  Medication Sig Dispense Refill   . cetirizine (ZYRTEC) 10 MG tablet Take 10 mg by mouth daily.      Marland Kitchen HYDROcodone-acetaminophen (NORCO/VICODIN) 5-325 MG per tablet Take 2 tablets by mouth every 6 (six) hours as needed for pain.  12 tablet  0  . hydrOXYzine (ATARAX/VISTARIL) 10 MG tablet Take 1 tablet (10 mg total) by mouth 3 (three) times daily as needed for itching.  30 tablet  0  . metoCLOPramide (REGLAN) 10 MG tablet Take 1 tablet (10 mg total) by mouth every 6 (six) hours as needed (nausea/headache).  10 tablet  0   Allergies  Allergen Reactions  . Penicillins Anaphylaxis  . Shellfish Allergy Anaphylaxis  . Other     Mushroom-- swelling throat, eyes Animals-- swelling throat, eyes    ROS: Pertinent items in HPI  OBJECTIVE Blood pressure 143/89, pulse 81, temperature 97.9 F (36.6 C), temperature source Oral, resp. rate 19, height 5\' 5"  (1.651 m), last menstrual period 11/06/2012, SpO2 99.00%. GENERAL: Well-developed, well-nourished female in moderate distress. Asking for a drink repeatedly.  HEENT: Normocephalic HEART:  normal rate RESP: normal effort ABDOMEN: Soft, mild, diffuse low abd tenderness. EXTREMITIES: Nontender, no edema NEURO: Alert and oriented SPECULUM EXAM: Not repeated. Small amount of BRB on pad.  LAB RESULTS Results for orders placed during the hospital encounter of 11/29/12 (from the past 24 hour(s))  CBC WITH DIFFERENTIAL     Status: Abnormal   Collection Time    11/29/12  6:10 PM      Result Value Range   WBC 6.2  4.0 - 10.5 K/uL   RBC 3.40 (*) 3.87 - 5.11 MIL/uL   Hemoglobin 9.6 (*) 12.0 - 15.0 g/dL   HCT 16.1 (*) 09.6 - 04.5 %   MCV 85.0  78.0 - 100.0 fL   MCH 28.2  26.0 - 34.0 pg   MCHC 33.2  30.0 - 36.0 g/dL   RDW 40.9  81.1 - 91.4 %   Platelets 382  150 - 400 K/uL   Neutrophils Relative % 57  43 - 77 %   Neutro Abs 3.5  1.7 - 7.7 K/uL   Lymphocytes Relative 35  12 - 46 %   Lymphs Abs 2.2  0.7 - 4.0 K/uL   Monocytes Relative 6  3 - 12 %   Monocytes Absolute 0.3  0.1 - 1.0  K/uL   Eosinophils Relative 1  0 - 5 %   Eosinophils Absolute 0.1  0.0 - 0.7 K/uL   Basophils Relative 1  0 - 1 %   Basophils Absolute 0.1  0.0 - 0.1 K/uL  BASIC METABOLIC PANEL     Status: Abnormal   Collection Time    11/29/12  6:10 PM      Result Value Range   Sodium 134 (*) 135 - 145 mEq/L   Potassium 3.2 (*) 3.5 - 5.1 mEq/L   Chloride 101  96 - 112 mEq/L   CO2 17 (*) 19 - 32 mEq/L   Glucose, Bld 94  70 - 99 mg/dL   BUN 6  6 - 23 mg/dL   Creatinine, Ser 7.82  0.50 - 1.10 mg/dL   Calcium 8.4  8.4 - 95.6 mg/dL   GFR calc non Af Amer >90  >90 mL/min   GFR calc Af Amer >90  >90 mL/min  URINALYSIS, ROUTINE W REFLEX MICROSCOPIC     Status: Abnormal   Collection Time    11/29/12  6:24 PM      Result Value Range   Color, Urine RED (*) YELLOW   APPearance CLOUDY (*) CLEAR   Specific Gravity, Urine 1.013  1.005 - 1.030   pH 5.5  5.0 - 8.0   Glucose, UA NEGATIVE  NEGATIVE mg/dL   Hgb urine dipstick LARGE (*) NEGATIVE   Bilirubin Urine NEGATIVE  NEGATIVE   Ketones, ur 15 (*) NEGATIVE mg/dL   Protein, ur 213 (*) NEGATIVE mg/dL   Urobilinogen, UA 0.2  0.0 - 1.0 mg/dL   Nitrite NEGATIVE  NEGATIVE   Leukocytes, UA MODERATE (*) NEGATIVE  URINE MICROSCOPIC-ADD ON     Status: Abnormal   Collection Time    11/29/12  6:24 PM      Result Value Range   Squamous Epithelial / LPF RARE  RARE   WBC, UA 3-6  <3 WBC/hpf   RBC / HPF TOO NUMEROUS TO COUNT  <3 RBC/hpf   Bacteria, UA RARE  RARE   Casts HYALINE CASTS (*) NEGATIVE  POCT PREGNANCY, URINE     Status: None   Collection Time    11/29/12  6:31  PM      Result Value Range   Preg Test, Ur NEGATIVE  NEGATIVE  WET PREP, GENITAL     Status: None   Collection Time    11/29/12  8:15 PM      Result Value Range   Yeast Wet Prep HPF POC NONE SEEN  NONE SEEN   Trich, Wet Prep NONE SEEN  NONE SEEN   Clue Cells Wet Prep HPF POC NONE SEEN  NONE SEEN   WBC, Wet Prep HPF POC NONE SEEN  NONE SEEN  CBC     Status: Abnormal   Collection Time     11/29/12 10:20 PM      Result Value Range   WBC 5.7  4.0 - 10.5 K/uL   RBC 3.33 (*) 3.87 - 5.11 MIL/uL   Hemoglobin 9.2 (*) 12.0 - 15.0 g/dL   HCT 45.4 (*) 09.8 - 11.9 %   MCV 85.3  78.0 - 100.0 fL   MCH 27.6  26.0 - 34.0 pg   MCHC 32.4  30.0 - 36.0 g/dL   RDW 14.7 (*) 82.9 - 56.2 %   Platelets 372  150 - 400 K/uL  COMPREHENSIVE METABOLIC PANEL     Status: Abnormal   Collection Time    11/29/12 10:20 PM      Result Value Range   Sodium 138  135 - 145 mEq/L   Potassium 4.1  3.5 - 5.1 mEq/L   Chloride 108  96 - 112 mEq/L   CO2 21  19 - 32 mEq/L   Glucose, Bld 100 (*) 70 - 99 mg/dL   BUN 5 (*) 6 - 23 mg/dL   Creatinine, Ser 1.30  0.50 - 1.10 mg/dL   Calcium 7.7 (*) 8.4 - 10.5 mg/dL   Total Protein 6.0  6.0 - 8.3 g/dL   Albumin 2.7 (*) 3.5 - 5.2 g/dL   AST 18  0 - 37 U/L   ALT 19  0 - 35 U/L   Alkaline Phosphatase 58  39 - 117 U/L   Total Bilirubin 0.1 (*) 0.3 - 1.2 mg/dL   GFR calc non Af Amer >90  >90 mL/min   GFR calc Af Amer >90  >90 mL/min    IMAGING US Transvaginal Non-ob  11/29/2012   CLINICAL DATA:  Pelvic pain and vaginal bleeding.  LMP 11/28/2012.  EXAM: TRANSVAGINAL ULTRASOUND OF PELVIS  TECHNIQUE: Transvaginal ultrasound examination of the pelvis was performed including evaluation of the uterus, ovaries, adnexal regions, and pelvic cul-de-sac.  COMPARISON:  10/13/2012  FINDINGS: Uterus  Measurements: 10.0 x 6.1 x 7.2 cm. Ill-defined area of heterogeneous echogenicity is seen in the myometrium of the posterior corpus. This lesion cannot be measured due to its lack of discrete margins, but appears to displace the endometrial stripe anteriorly. This could represent a fibroid or focal adenomyoma.  Endometrium  Thickness: 7 mm.  No focal abnormality visualized.  Right ovary  Measurements: 4.0 x 2.1 x 1.4 cm. Normal appearance/no adnexal mass.  Left ovary  Measurements: 2.9 x 1.7 x 1.7 cm. Normal appearance. Normal appearance/no adnexal mass.  Other findings:  No free fluid   IMPRESSION: Poorly defined area of heterogeneous echogenicity in the posterior uterine myometrium, which could be due to a fibroid or focal adenomyoma. Consider nonemergent pelvic MRI for further evaluation if clinically warranted.  Normal appearance of both ovaries. No adnexal mass identified.   Electronically Signed   By: Myles Rosenthal M.D.   On: 11/29/2012 19:46   US  Pelvis Complete  11/29/2012   CLINICAL DATA:  Pelvic pain and vaginal bleeding.  LMP 11/28/2012.  EXAM: TRANSVAGINAL ULTRASOUND OF PELVIS  TECHNIQUE: Transvaginal ultrasound examination of the pelvis was performed including evaluation of the uterus, ovaries, adnexal regions, and pelvic cul-de-sac.  COMPARISON:  10/13/2012  FINDINGS: Uterus  Measurements: 10.0 x 6.1 x 7.2 cm. Ill-defined area of heterogeneous echogenicity is seen in the myometrium of the posterior corpus. This lesion cannot be measured due to its lack of discrete margins, but appears to displace the endometrial stripe anteriorly. This could represent a fibroid or focal adenomyoma.  Endometrium  Thickness: 7 mm.  No focal abnormality visualized.  Right ovary  Measurements: 4.0 x 2.1 x 1.4 cm. Normal appearance/no adnexal mass.  Left ovary  Measurements: 2.9 x 1.7 x 1.7 cm. Normal appearance. Normal appearance/no adnexal mass.  Other findings:  No free fluid  IMPRESSION: Poorly defined area of heterogeneous echogenicity in the posterior uterine myometrium, which could be due to a fibroid or focal adenomyoma. Consider nonemergent pelvic MRI for further evaluation if clinically warranted.  Normal appearance of both ovaries. No adnexal mass identified.   Electronically Signed   By: Myles Rosenthal M.D.   On: 11/29/2012 19:46    MAU COURSE Dilaudid, Orthostatic VS, CBC and CMET ordered.   Mild pain relief w/ Dilaudid 2 mg. CNM concerned that pain has not improved more. Review of UA shows large Hgb, mod LE, rare squamous epithelials and rare bacteria. Discussed w/ Dr. Jolayne Panther. Asked if  CT needed to eval for Kidney Stone. Not necessary per Dr. Jolayne Panther. Would manage the same w/ fluids and Pain meds. Asked pt to compare this pain to previous episodes. Similar location, but today has felt severe bladder pressure. No dysuria, urgency, flank pain. Frequency at night, but this is a long-term problems. Moderate right CVAT. Has Hx Kidney Stones. States this doesn't feel the same. Will Tx as mild Pyelo.   Cipro, Megace and Percocet given prior to D/C. Pt in mild distress. Comfortable w/ POC.   ASSESSMENT 1. Menorrhagia   2. Fibroids   3. Pyelonephritis, acute   4. Hypokalemia     PLAN Discharge home in stable condition.  Push fluids. Change position slowly.  Increase dietary iron. Her to come to [facility] long ED for worsening flank pain, fever or urinary symptoms. Since abdominal pain seems to coincide with onset of menses, will keep patient on May used continuously until surgery. This will also give her an opportunity to increase her hemoglobin prior to surgery.     Follow-up Information   Follow up with [facility] On 12/09/2012.   Specialty:  Obstetrics and Gynecology   Contact information:   [address] [place] [address] [phone]      Follow up with [facility] [place] [facility]ERNITY ADMISSIONS. (As needed in emergencies.)    Contact information:   [address] [id number] [place] [address] [phone]       Medication List         cetirizine 10 MG tablet  Commonly known as:  ZYRTEC  Take 10 mg by mouth daily.     ciprofloxacin 500 MG tablet  Commonly known as:  CIPRO  Take 1 tablet (500 mg total) by mouth 2 (two) times daily.     diclofenac 75 MG EC tablet  Commonly known as:  VOLTAREN  Take 1 tablet (75 mg total) by mouth 3 (three) times daily. Take on schedule.  HYDROcodone-acetaminophen 5-325 MG per tablet  Commonly known as:  NORCO/VICODIN  Take 2 tablets by mouth every 6 (six) hours as needed  for pain.     hydrOXYzine 10 MG tablet  Commonly known as:  ATARAX/VISTARIL  Take 1 tablet (10 mg total) by mouth 3 (three) times daily as needed for itching.     megestrol 20 MG tablet  Commonly known as:  MEGACE  - Take 1 tablet (20 mg total) by mouth as directed. Take two tablets (40 mg) three times per day time three days,   - then take two tablets (40 mg) two times per day time three days,   - then take two tablets (40 mg)once per day     metoCLOPramide 10 MG tablet  Commonly known as:  REGLAN  Take 1 tablet (10 mg total) by mouth every 6 (six) hours as needed (nausea/headache).     mupirocin cream 2 %  Commonly known as:  BACTROBAN  Apply 1 application topically daily as needed. Rash     oxyCODONE-acetaminophen 5-325 MG per tablet  Commonly known as:  PERCOCET/ROXICET  Take 1-2 tablets by mouth every 4 (four) hours as needed for breakthrough pain.     triamcinolone cream 0.1 %  Commonly known as:  KENALOG  Apply 1 application topically daily as needed. For rash per patient       Dorathy Kinsman, CNM 11/30/2012  1:10 AM

## 2012-11-29 NOTE — ED Provider Notes (Signed)
CSN: [id number]     Arrival date & time 11/29/12  1726 History   First MD Initiated Contact with Patient 11/29/12 1759     Chief Complaint  Patient presents with  . Vaginal Bleeding   (Consider location/radiation/quality/duration/timing/severity/associated sxs/prior Treatment) HPI  Marisa [name] is a 37 y.o. female complaining of heavy vaginal bleeding onset yesterday. LMP 9/20. Pt has gone through 8 pads and a diaper today. She has lightheaded sensation and an episode of syncope. [facility] Has been on OCP with little relief from heavy menstruation.  Pt has 9/10 abdominal pain in the mid to right lower quadrants.  Denies CP, palpitations, SOB, fever.     Past Medical History  Diagnosis Date  . Asthma   . EP (ectopic pregnancy)   . Ovarian tumor   . Tumor, thyroid   . Sleep apnea   . Chronic headaches   . Irregular heartbeat   . Chronic female pelvic pain 10/11/2012  . Abnormal uterine bleeding (AUB) 10/11/2012   Past Surgical History  Procedure Laterality Date  . Cholecystectomy  2005  . Unilateral salpingectomy  2009    Abdominal left salpingectomy   Family History  Problem Relation Age of Onset  . Hypertension Mother   . Diabetes Mother   . Cancer Father   . Hyperlipidemia Father   . Hypertension Father   . Allergies Mother   . Heart disease Mother   . Heart disease Maternal Grandmother    History  Substance Use Topics  . Smoking status: Former Smoker -- 0.25 packs/day for 26 years    Types: Cigarettes    Quit date: 09/12/2012  . Smokeless tobacco: Never Used     Comment: 1 pack per week  . Alcohol Use: No     Comment: 1 bottle of wine per week   OB History   Grav Para Term Preterm Abortions TAB SAB Ect Mult Living   3 1 1  2 1  1  1      Review of Systems 10 systems reviewed and found to be negative, except as noted in the HPI  Allergies  Penicillins; Shellfish allergy; and Other  Home  Medications   Current Outpatient Rx  Name  Route  Sig  Dispense  Refill  . cetirizine (ZYRTEC) 10 MG tablet   Oral   Take 10 mg by mouth daily.         Marland Kitchen HYDROcodone-acetaminophen (NORCO/VICODIN) 5-325 MG per tablet   Oral   Take 2 tablets by mouth every 6 (six) hours as needed for pain.   12 tablet   0   . hydrOXYzine (ATARAX/VISTARIL) 10 MG tablet   Oral   Take 1 tablet (10 mg total) by mouth 3 (three) times daily as needed for itching.   30 tablet   0   . metoCLOPramide (REGLAN) 10 MG tablet   Oral   Take 1 tablet (10 mg total) by mouth every 6 (six) hours as needed (nausea/headache).   10 tablet   0   . mupirocin cream (BACTROBAN) 2 %   Topical   Apply 1 application topically daily as needed. Rash         . triamcinolone cream (KENALOG) 0.1 %   Topical   Apply 1 application topically daily as needed. For rash per patient          BP 97/56  Pulse 114  Resp 18  Ht 5\' 5"  (  1.651 m)  SpO2 98%  LMP 11/29/2012 Physical Exam  Nursing note and vitals reviewed. Constitutional: She is oriented to person, place, and time. She appears well-developed and well-nourished. No distress.  HENT:  Head: Normocephalic and atraumatic.  Mouth/Throat: Oropharynx is clear and moist.  Eyes: Conjunctivae and EOM are normal. Pupils are equal, round, and reactive to light.  Neck: Normal range of motion.  Cardiovascular: Normal rate, regular rhythm and intact distal pulses.   Pulmonary/Chest: Effort normal and breath sounds normal. No stridor. No respiratory distress. She has no wheezes. She has no rales. She exhibits no tenderness.  Abdominal: Soft. She exhibits no distension and no mass. There is tenderness. There is no rebound and no guarding.  Diffusely tender to palpation in the bilateral lower quadrants, right greater than left. No guarding or rebound.  Genitourinary:  Pelvic exam chaperoned by nurse:  There is dried blood in the perineal area, no rashes or lesions. His  bright red blood from the cervix, the vaginal vault has multiple large clots, suction is necessary to clear the vault.    Musculoskeletal: Normal range of motion. She exhibits no edema.  Neurological: She is alert and oriented to person, place, and time.  Psychiatric: She has a normal mood and affect.    ED Course  Procedures (including critical care time) Labs Review Labs Reviewed  CBC WITH DIFFERENTIAL - Abnormal; Notable for the following:    RBC 3.40 (*)    Hemoglobin 9.6 (*)    HCT 28.9 (*)    All other components within normal limits  BASIC METABOLIC PANEL - Abnormal; Notable for the following:    Sodium 134 (*)    Potassium 3.2 (*)    CO2 17 (*)    All other components within normal limits  URINALYSIS, ROUTINE W REFLEX MICROSCOPIC - Abnormal; Notable for the following:    Color, Urine RED (*)    APPearance CLOUDY (*)    Hgb urine dipstick LARGE (*)    Ketones, ur 15 (*)    Protein, ur 100 (*)    Leukocytes, UA MODERATE (*)    All other components within normal limits  URINE MICROSCOPIC-ADD ON - Abnormal; Notable for the following:    Casts HYALINE CASTS (*)    All other components within normal limits  WET PREP, GENITAL  GC/CHLAMYDIA PROBE AMP  POCT PREGNANCY, URINE   Imaging Review US Transvaginal Non-ob  11/29/2012   CLINICAL DATA:  Pelvic pain and vaginal bleeding.  LMP 11/28/2012.  EXAM: TRANSVAGINAL ULTRASOUND OF PELVIS  TECHNIQUE: Transvaginal ultrasound examination of the pelvis was performed including evaluation of the uterus, ovaries, adnexal regions, and pelvic cul-de-sac.  COMPARISON:  10/13/2012  FINDINGS: Uterus  Measurements: 10.0 x 6.1 x 7.2 cm. Ill-defined area of heterogeneous echogenicity is seen in the myometrium of the posterior corpus. This lesion cannot be measured due to its lack of discrete margins, but appears to displace the endometrial stripe anteriorly. This could represent a fibroid or focal adenomyoma.  Endometrium  Thickness: 7 mm.  No  focal abnormality visualized.  Right ovary  Measurements: 4.0 x 2.1 x 1.4 cm. Normal appearance/no adnexal mass.  Left ovary  Measurements: 2.9 x 1.7 x 1.7 cm. Normal appearance. Normal appearance/no adnexal mass.  Other findings:  No free fluid  IMPRESSION: Poorly defined area of heterogeneous echogenicity in the posterior uterine myometrium, which could be due to a fibroid or focal adenomyoma. Consider nonemergent pelvic MRI for further evaluation if clinically warranted.  Normal appearance  of both ovaries. No adnexal mass identified.   Electronically Signed   By: Myles Rosenthal M.D.   On: 11/29/2012 19:46   US Pelvis Complete  11/29/2012   CLINICAL DATA:  Pelvic pain and vaginal bleeding.  LMP 11/28/2012.  EXAM: TRANSVAGINAL ULTRASOUND OF PELVIS  TECHNIQUE: Transvaginal ultrasound examination of the pelvis was performed including evaluation of the uterus, ovaries, adnexal regions, and pelvic cul-de-sac.  COMPARISON:  10/13/2012  FINDINGS: Uterus  Measurements: 10.0 x 6.1 x 7.2 cm. Ill-defined area of heterogeneous echogenicity is seen in the myometrium of the posterior corpus. This lesion cannot be measured due to its lack of discrete margins, but appears to displace the endometrial stripe anteriorly. This could represent a fibroid or focal adenomyoma.  Endometrium  Thickness: 7 mm.  No focal abnormality visualized.  Right ovary  Measurements: 4.0 x 2.1 x 1.4 cm. Normal appearance/no adnexal mass.  Left ovary  Measurements: 2.9 x 1.7 x 1.7 cm. Normal appearance. Normal appearance/no adnexal mass.  Other findings:  No free fluid  IMPRESSION: Poorly defined area of heterogeneous echogenicity in the posterior uterine myometrium, which could be due to a fibroid or focal adenomyoma. Consider nonemergent pelvic MRI for further evaluation if clinically warranted.  Normal appearance of both ovaries. No adnexal mass identified.   Electronically Signed   By: Myles Rosenthal M.D.   On: 11/29/2012 19:46    EKG Interpretation    None       MDM   1. Menorrhagia   2. Fibroids     Filed Vitals:   11/29/12 1926 11/29/12 2000 11/29/12 2030 11/29/12 2100  BP: 113/65 129/79 120/74 107/74  Pulse: 92 96 91 88  Temp: 97.9 F (36.6 C)     TempSrc: Oral     Resp: 17   19  Height:      SpO2: 98% 97% 100% 99%     Marisa [name] is a 37 y.o. female was very heavy menstruation onset in the last day. On pelvic exam she is bleeding significantly: Bright red blood through the cervix, suction necessary to properly visualize the cervix. She is not anemic however I feel that her labs have not yet caught up with the acute nature of her bleed. She is tachycardic initially which has responded well to IV fluids. Patient is scheduled for myomectomy  GYN consult from Dr. Ladean Raya appreciated: She will accept the patient at [facility]. Patient is hemodynamically stable and appropriate for transport to specialized care.  Medications  HYDROmorphone (DILAUDID) injection 1 mg (not administered)  morphine 4 MG/ML injection 4 mg (4 mg Intravenous Given 11/29/12 1818)  ondansetron (ZOFRAN) injection 4 mg (4 mg Intravenous Given 11/29/12 1818)  sodium chloride 0.9 % bolus 1,000 mL (0 mLs Intravenous Stopped 11/29/12 2024)  potassium chloride SA (K-DUR,KLOR-CON) CR tablet 40 mEq (40 mEq Oral Given 11/29/12 1934)  HYDROmorphone (DILAUDID) injection 1 mg (1 mg Intravenous Given 11/29/12 1935)  sodium chloride 0.9 % bolus 1,000 mL (1,000 mLs Intravenous New Bag/Given 11/29/12 2026)   Note: Portions of this report may have been transcribed using voice recognition software. Every effort was made to ensure accuracy; however, inadvertent computerized transcription errors may be present    Wynetta Emery, PA-C 11/29/12 2119

## 2012-11-29 NOTE — ED Notes (Signed)
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 Pt currently taking cipro. Pain woke her up this morning, vaginal bleeding. Per pt, normal to have vaginal bleeding and pain with tumor. Pain in lower pelvic area radiating to back. Pt went through three saturated pads this morning. Pt stated she passed a softball size clot. [facility]

## 2012-11-29 NOTE — ED Notes (Signed)
I transported pt to radiology and back. Observed entire procedure from start to finish. Pt back in room

## 2012-11-29 NOTE — ED Notes (Signed)
Per pt, usually feels pain and bleeding, but today it got worse. Surgery on the 20th. Pt states she was taking cipro because she had a biopsy done and there was "an infection in my uterus." Pt feels dizzy and in mild distress. Denies N/V/D.

## 2012-11-29 NOTE — ED Notes (Signed)
Pt voided in bedside commode. Attempted to clean pt thoroughly. Large clot noted in bedpan after pt urinated.

## 2012-11-29 NOTE — ED Notes (Signed)
Pt discarded pad from underwear, states she just put it on before she arrived here. Pad was saturated with a moderate amount of blood

## 2012-11-29 NOTE — MAU Note (Signed)
Has been having vaginal bleeding since yesterday.  Scheduled surgical procedure Nov 2014.

## 2012-11-30 DIAGNOSIS — N920 Excessive and frequent menstruation with regular cycle: Secondary | ICD-10-CM

## 2012-11-30 LAB — GC/CHLAMYDIA PROBE AMP
CT Probe RNA: NEGATIVE
GC Probe RNA: NEGATIVE

## 2012-11-30 MED ORDER — CIPROFLOXACIN HCL 500 MG PO TABS
500.0000 mg | ORAL_TABLET | Freq: Once | ORAL | Status: AC
  Administered 2012-11-30: 500 mg via ORAL
  Filled 2012-11-30: qty 1

## 2012-11-30 MED ORDER — OXYCODONE-ACETAMINOPHEN 5-325 MG PO TABS
2.0000 | ORAL_TABLET | Freq: Once | ORAL | Status: AC
  Administered 2012-11-30: 2 via ORAL
  Filled 2012-11-30: qty 2

## 2012-11-30 MED ORDER — MEGESTROL ACETATE 40 MG PO TABS
40.0000 mg | ORAL_TABLET | Freq: Once | ORAL | Status: AC
  Administered 2012-11-30: 40 mg via ORAL
  Filled 2012-11-30: qty 1

## 2012-11-30 MED ORDER — DICLOFENAC SODIUM 75 MG PO TBEC: 75.0000 mg | DELAYED_RELEASE_TABLET | Freq: Three times a day (TID) | ORAL | Status: DC

## 2012-11-30 MED ORDER — CIPROFLOXACIN HCL 500 MG PO TABS: 500.0000 mg | ORAL_TABLET | Freq: Two times a day (BID) | ORAL | Status: DC

## 2012-11-30 MED ORDER — MEGESTROL ACETATE 20 MG PO TABS: 20.0000 mg | ORAL_TABLET | ORAL | Status: DC

## 2012-11-30 MED ORDER — OXYCODONE-ACETAMINOPHEN 5-325 MG PO TABS: 1.0000 | ORAL_TABLET | ORAL | Status: DC | PRN

## 2012-11-30 NOTE — ED Provider Notes (Signed)
Medical screening examination/treatment/procedure(s) were performed by non-physician practitioner and as supervising physician I was immediately available for consultation/collaboration.   Date: 11/30/2012  Rate: 87  Rhythm: normal sinus rhythm  QRS Axis: normal  Intervals: normal  ST/T Wave abnormalities: normal  Conduction Disutrbances:none  Narrative Interpretation: No ST or T wave changes consistent with ischemia.    Old EKG Reviewed: none available    Junius Argyle, MD 11/30/12 [phone]

## 2012-11-30 NOTE — MAU Provider Note (Signed)
Attestation of Attending Supervision of Advanced Practitioner (CNM/NP): Evaluation and management procedures were performed by the Advanced Practitioner under my supervision and collaboration.  I have reviewed the Advanced Practitioner's note and chart, and I agree with the management and plan.  Artis Beggs 11/30/2012 6:58 AM

## 2012-12-09 ENCOUNTER — Ambulatory Visit (INDEPENDENT_AMBULATORY_CARE_PROVIDER_SITE_OTHER): Payer: No Typology Code available for payment source | Admitting: Obstetrics & Gynecology

## 2012-12-09 ENCOUNTER — Encounter: Payer: Self-pay | Admitting: Obstetrics & Gynecology

## 2012-12-09 VITALS — BP 145/93 | HR 103 | Temp 98.3°F | Ht 64.75 in | Wt 225.8 lb

## 2012-12-09 DIAGNOSIS — D219 Benign neoplasm of connective and other soft tissue, unspecified: Secondary | ICD-10-CM

## 2012-12-09 DIAGNOSIS — N939 Abnormal uterine and vaginal bleeding, unspecified: Secondary | ICD-10-CM

## 2012-12-09 DIAGNOSIS — N926 Irregular menstruation, unspecified: Secondary | ICD-10-CM

## 2012-12-09 DIAGNOSIS — N949 Unspecified condition associated with female genital organs and menstrual cycle: Secondary | ICD-10-CM

## 2012-12-09 DIAGNOSIS — D259 Leiomyoma of uterus, unspecified: Secondary | ICD-10-CM

## 2012-12-09 DIAGNOSIS — G8929 Other chronic pain: Secondary | ICD-10-CM

## 2012-12-09 NOTE — Patient Instructions (Addendum)
Return to clinic for any scheduled appointments or for any gynecologic concerns as needed.   Hysterectomy Information  A hysterectomy is a procedure where your uterus is surgically removed. It will no longer be possible to have menstrual periods or to become pregnant. The tubes and ovaries can be removed (bilateral salpingo-oopherectomy) during this surgery as well.  REASONS FOR A HYSTERECTOMY  Persistent, abnormal bleeding.  Lasting (chronic) pelvic pain or infection.  The lining of the uterus (endometrium) starts growing outside the uterus (endometriosis).  The endometrium starts growing in the muscle of the uterus (adenomyosis).  The uterus falls down into the vagina (pelvic organ prolapse).  Symptomatic uterine fibroids.  Precancerous cells.  Cervical cancer or uterine cancer. TYPES OF HYSTERECTOMIES  Supracervical hysterectomy. This type removes the top part of the uterus, but not the cervix.  Total hysterectomy. This type removes the uterus and cervix.  Radical hysterectomy. This type removes the uterus, cervix, and the fibrous tissue that holds the uterus in place in the pelvis (parametrium). WAYS A HYSTERECTOMY CAN BE PERFORMED  Abdominal hysterectomy. A large surgical cut (incision) is made in the abdomen. The uterus is removed through this incision.  Vaginal hysterectomy. An incision is made in the vagina. The uterus is removed through this incision. There are no abdominal incisions.  Conventional laparoscopic hysterectomy. A thin, lighted tube with a camera (laparoscope) is inserted into 3 or 4 small incisions in the abdomen. The uterus is cut into small pieces. The small pieces are removed through the incisions, or they are removed through the vagina.  Laparoscopic assisted vaginal hysterectomy (LAVH). Three or four small incisions are made in the abdomen. Part of the surgery is performed laparoscopically and part vaginally. The uterus is removed through the  vagina.  Robot-assisted laparoscopic hysterectomy. A laparoscope is inserted into 3 or 4 small incisions in the abdomen. A computer-controlled device is used to give the surgeon a 3D image. This allows for more precise movements of surgical instruments. The uterus is cut into small pieces and removed through the incisions or removed through the vagina. RISKS OF HYSTERECTOMY   Bleeding and risk of blood transfusion. Tell your caregiver if you do not want to receive any blood products.  Blood clots in the legs or lung.  Infection.  Injury to surrounding organs.  Anesthesia problems or side effects.  Conversion to an abdominal hysterectomy. WHAT TO EXPECT AFTER A HYSTERECTOMY  You will be given pain medicine.  You will need to have someone with you for the first 3 to 5 days after you go home.  You will need to follow up with your surgeon in 2 to 4 weeks after surgery to evaluate your progress.  You may have early menopause symptoms like hot flashes, night sweats, and insomnia.  If you had a hysterectomy for a problem that was not a cancer or a condition that could lead to cancer, then you no longer need Pap tests. However, even if you no longer need a Pap test, a regular exam is a good idea to make sure no other problems are starting. Document Released: 07/30/2000 Document Revised: 04/28/2011 Document Reviewed: 09/14/2010 [facility] Patient Information 2014 [place], [place].  Thank you for enrolling in MyChart. Please follow the instructions below to securely access your online medical record. MyChart allows you to send messages to your doctor, view your test results, manage appointments, and more.    How Do I Sign Up? 1. In your [employer], go to [employer] and  enter [email or web address]. 2. Click on the Sign Up Now link in the Sign In box. You will see the New Member Sign Up page. 3. Enter your MyChart Access Code exactly as it appears below. You will not need to  use this code after you've completed the sign-up process. If you do not sign up before the expiration date, you must request a new code.  MyChart Access Code: [email or web address] Expires: 12/10/2012  2:37 PM  4. Enter your Social Security Number ([id number]) and Date of Birth (mm/dd/yyyy) as indicated and click Submit. You will be taken to the next sign-up page. 5. Create a MyChart ID. This will be your MyChart login ID and cannot be changed, so think of one that is secure and easy to remember. 6. Create a MyChart password. You can change your password at any time. 7. Enter your Password Reset Question and Answer. This can be used at a later time if you forget your password.  8. Enter your e-mail address. You will receive e-mail notification when new information is available in MyChart. 9. Click Sign Up. You can now view your medical record.   Additional Information Remember, MyChart is NOT to be used for urgent needs. For medical emergencies, dial 911.

## 2012-12-09 NOTE — Progress Notes (Signed)
GYNECOLOGY CLINIC ENCOUNTER NOTE  History:  37 y.o. G3P1021 here today for preoperative discussion prior to scheduled abdominal myomectomy on 01/06/13.  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 She was placed on Megace and told to take it continuously until surgery.  She currently says she is tired of the bleeding and the associated pain and is happy to be undergoing surgery soon.  She is accompanied by her husband.  The following portions of the patient's history were reviewed and updated as appropriate: allergies, current medications, past family history, past medical history, past social history, past surgical history and problem list.  Past Medical History  Diagnosis Date  . Asthma   . EP (ectopic pregnancy)   . Ovarian tumor   . Tumor, thyroid   . Sleep apnea   . Chronic headaches   . Irregular heartbeat   . Chronic female pelvic pain 10/11/2012  . Abnormal uterine bleeding (AUB) 10/11/2012  . Cardiac arrest 1992    during delivery   Past Surgical History  Procedure Laterality Date  . Cholecystectomy  2005  . Unilateral salpingectomy  2009    Abdominal left salpingectomy    Review of Systems:  Pertinent items are noted in HPI.   Objective:   Physical Exam BP 145/93  Pulse 103  Temp(Src) 98.3 F (36.8 C)  Ht 5' 4.75" (1.645 m)  Wt 225 lb 12.8 oz (102.422 kg)  BMI 37.85 kg/m2  LMP 11/27/2012 Gen: NAD Abd: Soft, nontender and nondistended Pelvic: Deferred  The following portions of the patient's history were reviewed and updated as appropriate: allergies, current medications, past family history, past medical history, past social history, past surgical history and problem list.   Labs and Imaging  10/11/2012 Endometrium Biopsy: BENIGN POLYPOID SECRETORY PATTERN ENDOMETRIUM. NO ATYPIA OR MALIGNANCY IDENTIFIED.   10/13/2012  TRANSABDOMINAL AND TRANSVAGINAL ULTRASOUND OF PELVIS Clinical Data: Severe pain 2 days following endometrial biopsy. Previous surgery: Left oophorectomy. Comparison: 03/25/2012 Findings: Uterus: The uterus measures 9.9 x 6.5 x 7.7 cm. Dominant fibroid versus focal adenomyosis along the posterior myometrium centered intramural, measuring 4.4 x 4.1 x 4.5 cm. Endometrium: Measures up to 11 mm in thickness and hyperemic along the lower uterine segment. Right ovary: Normal appearance/no adnexal mass, measuring 3.5 x 2.1 x 1.8 cm. Left ovary: Surgically absent. Other findings: No free fluid IMPRESSION: Hyperemia of the endometrium within the lower uterine segment may reflect reactive/inflammatory change or infection in the appropriate clinical setting. No endoluminal fluid or debris. Posterior intramural fibroid versus focal adenomyosis measuring 4.5 cm.  06/22/2012 TRANSABDOMINAL AND TRANSVAGINAL ULTRASOUND OF PELVIS DOPPLER ULTRASOUND OF OVARIES Clinical Data: Severe right pelvic pain Comparison: CT same date, pelvic ultrasound 06/14/2012 Findings: Uterus: Anteverted, anteflexed. 10.7 x 7.3 x 5.9 cm. Focal mass-like enlargement at the posterior uterine body is again noted measuring 5.4 x 4.0 x 3.9 cm. This produces anterior displacement of the endometrial stripe. Endometrium: 6 mm. Normal. Right ovary: 3.0 x 2.7 x 1.8 cm. Normal. Left ovary: 2.2 x 2.6 x 1.7 cm. Normal. Pulsed Doppler evaluation demonstrates normal low-resistance arterial and venous waveforms in both ovaries. IMPRESSION: No acute abnormality. Stable mass-like thickening of the posterior mid uterine body myometrium which could represent an underlying fibroid versus focal adenomyosis. No sonographic evidence for ovarian torsion.   11/29/2012   TRANSVAGINAL ULTRASOUND OF PELVIS  CLINICAL DATA:  Pelvic pain and vaginal   bleeding.  LMP 11/28/2012.    COMPARISON:  10/13/2012  FINDINGS: Uterus  Measurements: 10.0 x 6.1 x 7.2 cm. Ill-defined area of  heterogeneous echogenicity is seen in the myometrium of the posterior corpus. This lesion cannot be measured due to its lack of discrete margins, but appears to displace the endometrial stripe anteriorly. This could represent a fibroid or focal adenomyoma.  Endometrium  Thickness: 7 mm.  No focal abnormality visualized.  Right ovary  Measurements: 4.0 x 2.1 x 1.4 cm. Normal appearance/no adnexal mass.  Left ovary  Measurements: 2.9 x 1.7 x 1.7 cm. Normal appearance. Normal appearance/no adnexal mass.  Other findings:  No free fluid  IMPRESSION: Poorly defined area of heterogeneous echogenicity in the posterior uterine myometrium, which could be due to a fibroid or focal adenomyoma. Consider nonemergent pelvic MRI for further evaluation if clinically warranted.  Normal appearance of both ovaries. No adnexal mass identified.   Electronically Signed   By: John  Stahl M.D.   On: 11/29/2012 19:46    Assessment & Plan:  Discussed surgical management options with patient again including myomectomy or hysterectomy. Patient wanted future childbearing during the last visit and does not want medical treatment with hormones in any form.  She was counseled that if this mass is a focal adenomyoma, it may be more difficult and cause more blood loss to remove it, which increases risk of hysterectomy.  Other risks of surgery were discussed with the patient including but not limited to: bleeding which may require transfusion or reoperation and hysterectomy; infection which may require antibiotics; injury to bowel, bladder, ureters or other surrounding organs; need for additional procedures; thromboembolic phenomenon, incisional problems and other postoperative/anesthesia complications. Also discussed need for cesarean deliveries at 36 - [redacted] weeks gestation for subsequent pregnancies given the intramural nature of this fibroid/adenomyoma and risk of posterior uterine rupture.   Patient and her husband feel like they are willing  to consider hysterectomy. She has a history of one SVD, she is a good candidate for TVH.   Patient agrees with this proposed surgery.  The risks of surgery were discussed in detail with the patient including but not limited to: bleeding which may require transfusion or reoperation; infection which may require antibiotics; injury to bowel, bladder, ureters or other surrounding organs; need for additional procedures including laparotomy; thromboembolic phenomenon, incisional problems and other postoperative/anesthesia complications.  Patient was also advised that she will remain in house for 1 night; and expected recovery time after a hysterectomy is 6-8 weeks.  Likelihood of success in alleviating the patient's symptoms was discussed. Routine postoperative instructions will be reviewed with the patient and her family in detail after surgery.  She was told that she will be contacted by our surgical scheduler regarding the time and date of her surgery; routine preoperative instructions of having nothing to eat or drink after midnight on the day prior to surgery and also coming to the hospital 1 1/2 hours prior to her time of surgery  (01/06/13 at 0730) were also emphasized.  She was told she will be called for a preoperative appointment about a week prior to surgery and will be given further preoperative instructions at that visit. In the meantime, she will continue Megace; bleeding precautions were reviewed. Printed patient education handouts about the procedure was given to the patient to review at home.  OR desk called and procedure was changed.  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   Aneisha Skyles, MD, FACOG Attending Obstetrician & Gynecologist Faculty Practice, [facility] 

## 2012-12-10 ENCOUNTER — Telehealth (HOSPITAL_COMMUNITY): Payer: Self-pay | Admitting: Obstetrics & Gynecology

## 2012-12-10 NOTE — Telephone Encounter (Signed)
Telephone call to patient to instruct her to schedule an appointment with her PCP for medical clearance prior to her upcoming surgery on 01/06/13.  Patient was not in, left message for her to call.

## 2012-12-13 ENCOUNTER — Encounter (HOSPITAL_COMMUNITY): Payer: Self-pay | Admitting: *Deleted

## 2012-12-15 ENCOUNTER — Telehealth: Payer: Self-pay | Admitting: Emergency Medicine

## 2012-12-15 NOTE — Telephone Encounter (Signed)
Pt called with scheduled appt 12/27/12 @ 530 pm with Dr. Hyman Hopes for medical clearance.

## 2012-12-20 ENCOUNTER — Encounter (HOSPITAL_COMMUNITY): Payer: Self-pay | Admitting: Emergency Medicine

## 2012-12-20 ENCOUNTER — Ambulatory Visit: Payer: No Typology Code available for payment source | Attending: Internal Medicine

## 2012-12-20 ENCOUNTER — Emergency Department (HOSPITAL_COMMUNITY)
Admission: EM | Admit: 2012-12-20 | Discharge: 2012-12-20 | Disposition: A | Discharge status: Home / Self Care | Payer: No Typology Code available for payment source | Attending: Emergency Medicine | Admitting: Emergency Medicine

## 2012-12-20 VITALS — BP 128/76 | HR 70 | Temp 98.1°F | Resp 16

## 2012-12-20 DIAGNOSIS — Z0289 Encounter for other administrative examinations: Secondary | ICD-10-CM

## 2012-12-20 DIAGNOSIS — Z87891 Personal history of nicotine dependence: Secondary | ICD-10-CM | POA: Insufficient documentation

## 2012-12-20 DIAGNOSIS — R42 Dizziness and giddiness: Secondary | ICD-10-CM | POA: Insufficient documentation

## 2012-12-20 DIAGNOSIS — Z8669 Personal history of other diseases of the nervous system and sense organs: Secondary | ICD-10-CM | POA: Insufficient documentation

## 2012-12-20 DIAGNOSIS — Z8679 Personal history of other diseases of the circulatory system: Secondary | ICD-10-CM | POA: Insufficient documentation

## 2012-12-20 DIAGNOSIS — Z79899 Other long term (current) drug therapy: Secondary | ICD-10-CM | POA: Insufficient documentation

## 2012-12-20 DIAGNOSIS — Z021 Encounter for pre-employment examination: Secondary | ICD-10-CM

## 2012-12-20 DIAGNOSIS — Z8742 Personal history of other diseases of the female genital tract: Secondary | ICD-10-CM | POA: Insufficient documentation

## 2012-12-20 DIAGNOSIS — N938 Other specified abnormal uterine and vaginal bleeding: Secondary | ICD-10-CM | POA: Insufficient documentation

## 2012-12-20 DIAGNOSIS — N949 Unspecified condition associated with female genital organs and menstrual cycle: Secondary | ICD-10-CM | POA: Insufficient documentation

## 2012-12-20 DIAGNOSIS — Z8674 Personal history of sudden cardiac arrest: Secondary | ICD-10-CM | POA: Insufficient documentation

## 2012-12-20 DIAGNOSIS — Z880 Allergy status to penicillin: Secondary | ICD-10-CM | POA: Insufficient documentation

## 2012-12-20 DIAGNOSIS — M6281 Muscle weakness (generalized): Secondary | ICD-10-CM | POA: Insufficient documentation

## 2012-12-20 DIAGNOSIS — R6883 Chills (without fever): Secondary | ICD-10-CM | POA: Insufficient documentation

## 2012-12-20 DIAGNOSIS — G8929 Other chronic pain: Secondary | ICD-10-CM | POA: Insufficient documentation

## 2012-12-20 DIAGNOSIS — J45909 Unspecified asthma, uncomplicated: Secondary | ICD-10-CM | POA: Insufficient documentation

## 2012-12-20 DIAGNOSIS — Z3202 Encounter for pregnancy test, result negative: Secondary | ICD-10-CM | POA: Insufficient documentation

## 2012-12-20 DIAGNOSIS — R197 Diarrhea, unspecified: Secondary | ICD-10-CM | POA: Insufficient documentation

## 2012-12-20 DIAGNOSIS — G4730 Sleep apnea, unspecified: Secondary | ICD-10-CM | POA: Insufficient documentation

## 2012-12-20 DIAGNOSIS — R112 Nausea with vomiting, unspecified: Secondary | ICD-10-CM | POA: Insufficient documentation

## 2012-12-20 DIAGNOSIS — N939 Abnormal uterine and vaginal bleeding, unspecified: Secondary | ICD-10-CM

## 2012-12-20 DIAGNOSIS — R000 Tachycardia, unspecified: Secondary | ICD-10-CM | POA: Insufficient documentation

## 2012-12-20 LAB — TYPE AND SCREEN
ABO/RH(D): O POS
Antibody Screen: NEGATIVE

## 2012-12-20 LAB — BASIC METABOLIC PANEL
BUN: 6 mg/dL (ref 6–23)
CO2: 23 mEq/L (ref 19–32)
Calcium: 9.1 mg/dL (ref 8.4–10.5)
Chloride: 105 mEq/L (ref 96–112)
Creatinine, Ser: 0.64 mg/dL (ref 0.50–1.10)
GFR calc Af Amer: >90 mL/min (ref >90)
GFR calc non Af Amer: >90 mL/min (ref >90)
Glucose, Bld: 108 mg/dL — ABNORMAL HIGH (ref 70–99)
Potassium: 3.5 mEq/L (ref 3.5–5.1)
Sodium: 138 mEq/L (ref 135–145)

## 2012-12-20 LAB — CBC
HCT: 32.6 % — ABNORMAL LOW (ref 36.0–46.0)
Hemoglobin: 10.7 g/dL — ABNORMAL LOW (ref 12.0–15.0)
MCH: 27.7 pg (ref 26.0–34.0)
MCHC: 32.8 g/dL (ref 30.0–36.0)
MCV: 84.5 fL (ref 78.0–100.0)
Platelets: 554 10*3/uL — ABNORMAL HIGH (ref 150–400)
RBC: 3.86 MIL/uL — ABNORMAL LOW (ref 3.87–5.11)
RDW: 15.2 % (ref 11.5–15.5)
WBC: 8.5 10*3/uL (ref 4.0–10.5)

## 2012-12-20 LAB — URINALYSIS, ROUTINE W REFLEX MICROSCOPIC
Bilirubin Urine: NEGATIVE
Glucose, UA: NEGATIVE mg/dL
Ketones, ur: NEGATIVE mg/dL
Leukocytes, UA: NEGATIVE
Nitrite: NEGATIVE
Protein, ur: NEGATIVE mg/dL
Specific Gravity, Urine: 1.023 (ref 1.005–1.030)
Urobilinogen, UA: 0.2 mg/dL (ref 0.0–1.0)
pH: 5.5 (ref 5.0–8.0)

## 2012-12-20 LAB — WET PREP, GENITAL
Clue Cells Wet Prep HPF POC: NONE SEEN
Trich, Wet Prep: NONE SEEN
Yeast Wet Prep HPF POC: NONE SEEN

## 2012-12-20 LAB — ABO/RH: ABO/RH(D): O POS

## 2012-12-20 LAB — URINE MICROSCOPIC-ADD ON

## 2012-12-20 LAB — PREGNANCY, URINE: Preg Test, Ur: NEGATIVE

## 2012-12-20 MED ORDER — ONDANSETRON 4 MG PO TBDP: 4.0000 mg | ORAL_TABLET | Freq: Three times a day (TID) | ORAL | Status: DC | PRN

## 2012-12-20 MED ORDER — MORPHINE SULFATE 4 MG/ML IJ SOLN
4.0000 mg | Freq: Once | INTRAMUSCULAR | Status: AC
  Administered 2012-12-20: 4 mg via INTRAVENOUS
  Filled 2012-12-20: qty 1

## 2012-12-20 MED ORDER — OXYCODONE-ACETAMINOPHEN 5-325 MG PO TABS: 1.0000 | ORAL_TABLET | ORAL | Status: DC | PRN

## 2012-12-20 MED ORDER — ONDANSETRON HCL 4 MG/2ML IJ SOLN
4.0000 mg | Freq: Once | INTRAMUSCULAR | Status: AC
  Administered 2012-12-20: 4 mg via INTRAVENOUS
  Filled 2012-12-20: qty 2

## 2012-12-20 MED ORDER — SODIUM CHLORIDE 0.9 % IV BOLUS (SEPSIS)
1000.0000 mL | Freq: Once | INTRAVENOUS | Status: AC
  Administered 2012-12-20: 1000 mL via INTRAVENOUS

## 2012-12-20 NOTE — ED Notes (Signed)
Pt is due for hysterectomy and sts this bleeding has been ongoing for three weeks.

## 2012-12-20 NOTE — ED Provider Notes (Signed)
CSN: [id number]     Arrival date & time 12/20/12  1435 History   First MD Initiated Contact with Patient 12/20/12 1450     Chief Complaint  Patient presents with  . Vaginal Bleeding   (Consider location/radiation/quality/duration/timing/severity/associated sxs/prior Treatment) HPI Comments: Patient is a 37 year old G3P1081female past medical history significant for ectopic pregnancy, ovarian tumor, thyroid tumor, abnormal uterine bleeding, chronic pelvic pain presenting to the emergency department for severe constant nonradiating sharp pelvic pain with associated vaginal bleeding for the last 3 weeks. Patient states she's had continued bleeding since being seen on October 13th. Patient is scheduled to undergo a hysterectomy on the 28th of this month over at [facility]. Patient states she has gone through 2 menstrual pads today, has noted clotting on menstrual pads and in the toilet bowl. Patient is endorsing associated generalized weakness, diarrhea, vomiting, subjective chills. Surgical history includes cholecystectomy and unilateral salpingectomy.   Patient is a 37 y.o. female presenting with vaginal bleeding.  Vaginal Bleeding Associated symptoms: nausea   Associated symptoms: no fever     Past Medical History  Diagnosis Date  . Asthma   . EP (ectopic pregnancy)   . Ovarian tumor   . Tumor, thyroid   . Sleep apnea   . Chronic headaches   . Irregular heartbeat   . Chronic female pelvic pain 10/11/2012  . Abnormal uterine bleeding (AUB) 10/11/2012  . Cardiac arrest 1992    during delivery   Past Surgical History  Procedure Laterality Date  . Cholecystectomy  2005  . Unilateral salpingectomy  2009    Abdominal left salpingectomy   Family History  Problem Relation Age of Onset  . Hypertension Mother   . Diabetes Mother   . Cancer Father   . Hyperlipidemia Father   . Hypertension Father   . Allergies Mother   . Heart disease Mother   . Heart disease Maternal Grandmother     History  Substance Use Topics  . Smoking status: Former Smoker -- 0.25 packs/day for 26 years    Types: Cigarettes    Quit date: 09/12/2012  . Smokeless tobacco: Never Used     Comment: 1 pack per week  . Alcohol Use: 0.6 oz/week    1 Cans of beer per week     Comment: 1 bottle of wine per week   OB History   Grav Para Term Preterm Abortions TAB SAB Ect Mult Living   3 1 1  2 1  1  1      Review of Systems  Constitutional: Negative for fever.  Gastrointestinal: Positive for nausea, vomiting and diarrhea.  Genitourinary: Positive for vaginal bleeding and pelvic pain.  Skin: Negative.   Neurological: Positive for light-headedness.  All other systems reviewed and are negative.    Allergies  Penicillins; Shellfish allergy; and Other  Home Medications   Current Outpatient Rx  Name  Route  Sig  Dispense  Refill  . cetirizine (ZYRTEC) 10 MG tablet   Oral   Take 10 mg by mouth daily as needed for allergies.          Marland Kitchen diclofenac (VOLTAREN) 75 MG EC tablet   Oral   Take 1 tablet (75 mg total) by mouth 3 (three) times daily. Take on schedule.   60 tablet   2   . HYDROcodone-acetaminophen (NORCO/VICODIN) 5-325 MG per tablet   Oral   Take 2 tablets by mouth every 6 (six) hours as needed for pain.   12 tablet  0   . hydrOXYzine (ATARAX/VISTARIL) 10 MG tablet   Oral   Take 1 tablet (10 mg total) by mouth 3 (three) times daily as needed for itching.   30 tablet   0   . megestrol (MEGACE) 20 MG tablet   Oral   Take 20 mg by mouth as directed. Take two tablets (40 mg) three times per day time three days,  then take two tablets (40 mg) two times per day time three days,  then take two tablets (40 mg)once per day         . ondansetron (ZOFRAN ODT) 4 MG disintegrating tablet   Oral   Take 1 tablet (4 mg total) by mouth every 8 (eight) hours as needed for nausea.   10 tablet   0   . oxyCODONE-acetaminophen (PERCOCET/ROXICET) 5-325 MG per tablet   Oral   Take  1-2 tablets by mouth every 4 (four) hours as needed for pain.   20 tablet   0     For breakthrough pain.    BP 129/80  Pulse 85  Temp(Src) 97.6 F (36.4 C) (Oral)  Resp 16  Ht 5\' 5"  (1.651 m)  Wt 227 lb 6.4 oz (103.148 kg)  BMI 37.84 kg/m2  SpO2 100%  LMP 11/27/2012 Physical Exam  Constitutional: She is oriented to person, place, and time. She appears well-developed and well-nourished.  HENT:  Head: Normocephalic and atraumatic.  Right Ear: External ear normal.  Left Ear: External ear normal.  Nose: Nose normal.  Eyes: Conjunctivae are normal.  Neck: Neck supple.  Cardiovascular: Regular rhythm, normal heart sounds and intact distal pulses.  Tachycardia present.   Pulmonary/Chest: Effort normal and breath sounds normal. No respiratory distress.  Abdominal: Soft. Bowel sounds are normal. She exhibits no distension. There is generalized tenderness. There is no rigidity, no rebound, no guarding and no CVA tenderness.  Genitourinary: Vagina normal.  Musculoskeletal: She exhibits no edema.  Neurological: She is alert and oriented to person, place, and time.  Skin: Skin is warm and dry. She is not diaphoretic.   Exam performed by Francee Piccolo L,  exam chaperoned Date: 12/20/2012 Pelvic exam: normal external genitalia without evidence of trauma. VULVA: normal appearing vulva with no masses, tenderness or lesion. VAGINA: normal appearing vagina with normal color and discharge, no lesions. CERVIX: normal appearing cervix without lesions, cervical motion tenderness absent, cervical os closed with out purulent discharge; vaginal discharge - scant bloody discharge noted, Wet prep and DNA probe for chlamydia and GC obtained.   ADNEXA: normal adnexa in size, nontender and no masses UTERUS: uterus is normal size, shape, consistency and nontender.   ED Course  Procedures (including critical care time)  Medications  sodium chloride 0.9 % bolus 1,000 mL (1,000 mLs Intravenous  New Bag/Given 12/20/12 1610)  morphine 4 MG/ML injection 4 mg (4 mg Intravenous Given 12/20/12 1611)  ondansetron (ZOFRAN) injection 4 mg (4 mg Intravenous Given 12/20/12 1609)  morphine 4 MG/ML injection 4 mg (4 mg Intravenous Given 12/20/12 1725)    Labs Review Labs Reviewed  CBC - Abnormal; Notable for the following:    RBC 3.86 (*)    Hemoglobin 10.7 (*)    HCT 32.6 (*)    Platelets 554 (*)    All other components within normal limits  BASIC METABOLIC PANEL - Abnormal; Notable for the following:    Glucose, Bld 108 (*)    All other components within normal limits  URINALYSIS, ROUTINE W REFLEX MICROSCOPIC - Abnormal;  Notable for the following:    APPearance HAZY (*)    Hgb urine dipstick LARGE (*)    All other components within normal limits  URINE MICROSCOPIC-ADD ON - Abnormal; Notable for the following:    Squamous Epithelial / LPF FEW (*)    Bacteria, UA FEW (*)    All other components within normal limits  GC/CHLAMYDIA PROBE AMP  WET PREP, GENITAL  PREGNANCY, URINE  TYPE AND SCREEN   Imaging Review No results found.  EKG Interpretation   None       MDM   1. Abnormal uterine bleeding   2. Chronic female pelvic pain     Afebrile, NAD, non-toxic appearing, AAOx4. Abdomen soft, mildly tender, w/o distention, guarding or rebound, no rigidity. Pelvic examination unremarkable, scant amounts of blood noted. No CMT, adnexal fullness appreciated. I have reviewed nursing notes, vital signs, and all appropriate lab results for this patient. Tachycardia improved with pain management. Hgb & Hct improved from visit three weeks ago (Hgb 9.2 --> 10.7). Discussed with patient and husband that these symptoms will likely continue until her scheduled hysterectomy is complete on 01/06/13. Pain medication indicated and discussed. Advised patient to f/u with Dr. [name] as soon as possible to discuss today's visit. Also advised patient that she may go to [facility] at their ED for  complaints surrounding her pelvic pain and AUB. Return precautions discussed. Patient is agreeable to plan. Patient d/w with Dr. Patria Mane, agrees with plan. Wet prep awaiting at shift change. Care changed to Earley Favor, NP. Stable at time of shift change.       Jeannetta Ellis, PA-C 12/20/12 1759

## 2012-12-20 NOTE — Progress Notes (Unsigned)
Patient here for ppd placement

## 2012-12-20 NOTE — ED Notes (Signed)
Pt complains of generalized pain and vaginal bleedng, hx of fibroid tumor and sts now bleeding is a Marisa [name] color sts on previous visit for same she was [facility] to [facility].

## 2012-12-21 LAB — GC/CHLAMYDIA PROBE AMP
CT Probe RNA: NEGATIVE
GC Probe RNA: NEGATIVE

## 2012-12-21 NOTE — ED Provider Notes (Signed)
Medical screening examination/treatment/procedure(s) were performed by non-physician practitioner and as supervising physician I was immediately available for consultation/collaboration.  Lyanne Co, MD 12/21/12 [phone]

## 2012-12-22 ENCOUNTER — Other Ambulatory Visit: Payer: No Typology Code available for payment source

## 2012-12-22 ENCOUNTER — Ambulatory Visit: Payer: No Typology Code available for payment source | Attending: Internal Medicine

## 2012-12-22 LAB — TB SKIN TEST
Induration: 0 mm
TB Skin Test: NEGATIVE

## 2012-12-23 ENCOUNTER — Inpatient Hospital Stay (HOSPITAL_COMMUNITY)
Admission: AD | Admit: 2012-12-23 | Discharge: 2012-12-23 | Disposition: A | Discharge status: Home / Self Care | Payer: No Typology Code available for payment source | Source: Ambulatory Visit | Attending: Obstetrics & Gynecology | Admitting: Obstetrics & Gynecology

## 2012-12-23 ENCOUNTER — Encounter (HOSPITAL_COMMUNITY): Payer: Self-pay | Admitting: *Deleted

## 2012-12-23 DIAGNOSIS — Z87891 Personal history of nicotine dependence: Secondary | ICD-10-CM | POA: Insufficient documentation

## 2012-12-23 DIAGNOSIS — R109 Unspecified abdominal pain: Secondary | ICD-10-CM | POA: Insufficient documentation

## 2012-12-23 DIAGNOSIS — N946 Dysmenorrhea, unspecified: Secondary | ICD-10-CM | POA: Insufficient documentation

## 2012-12-23 DIAGNOSIS — N921 Excessive and frequent menstruation with irregular cycle: Secondary | ICD-10-CM

## 2012-12-23 DIAGNOSIS — D500 Iron deficiency anemia secondary to blood loss (chronic): Secondary | ICD-10-CM | POA: Insufficient documentation

## 2012-12-23 DIAGNOSIS — N920 Excessive and frequent menstruation with regular cycle: Secondary | ICD-10-CM

## 2012-12-23 LAB — TYPE AND SCREEN
ABO/RH(D): O POS
Antibody Screen: NEGATIVE

## 2012-12-23 LAB — CBC
HCT: 29.7 % — ABNORMAL LOW (ref 36.0–46.0)
Hemoglobin: 9.4 g/dL — ABNORMAL LOW (ref 12.0–15.0)
MCH: 27 pg (ref 26.0–34.0)
MCHC: 31.6 g/dL (ref 30.0–36.0)
MCV: 85.3 fL (ref 78.0–100.0)
Platelets: 504 10*3/uL — ABNORMAL HIGH (ref 150–400)
RBC: 3.48 MIL/uL — ABNORMAL LOW (ref 3.87–5.11)
RDW: 15.1 % (ref 11.5–15.5)
WBC: 6.7 10*3/uL (ref 4.0–10.5)

## 2012-12-23 LAB — ABO/RH: ABO/RH(D): O POS

## 2012-12-23 MED ORDER — MEGESTROL ACETATE 20 MG PO TABS: 40.0000 mg | ORAL_TABLET | Freq: Three times a day (TID) | ORAL | Status: DC

## 2012-12-23 MED ORDER — MEGESTROL ACETATE 40 MG PO TABS
40.0000 mg | ORAL_TABLET | Freq: Once | ORAL | Status: AC
  Administered 2012-12-23: 40 mg via ORAL
  Filled 2012-12-23: qty 1

## 2012-12-23 MED ORDER — LORAZEPAM 1 MG PO TABS
2.0000 mg | ORAL_TABLET | Freq: Once | ORAL | Status: AC
  Administered 2012-12-23: 2 mg via ORAL
  Filled 2012-12-23: qty 2

## 2012-12-23 MED ORDER — OXYCODONE-ACETAMINOPHEN 5-325 MG PO TABS
2.0000 | ORAL_TABLET | Freq: Once | ORAL | Status: AC
  Administered 2012-12-23: 2 via ORAL
  Filled 2012-12-23: qty 2

## 2012-12-23 MED ORDER — LORAZEPAM 1 MG PO TABS: 1.0000 mg | ORAL_TABLET | Freq: Three times a day (TID) | ORAL | Status: DC | PRN

## 2012-12-23 MED ORDER — OXYCODONE-ACETAMINOPHEN 5-325 MG PO TABS: 1.0000 | ORAL_TABLET | Freq: Four times a day (QID) | ORAL | Status: DC | PRN

## 2012-12-23 MED ORDER — KETOROLAC TROMETHAMINE 60 MG/2ML IM SOLN
60.0000 mg | Freq: Once | INTRAMUSCULAR | Status: AC
  Administered 2012-12-23: 60 mg via INTRAMUSCULAR
  Filled 2012-12-23: qty 2

## 2012-12-23 NOTE — Discharge Instructions (Signed)
Be careful when combining sedating medications such as Percocet and Ativan.     Abnormal Uterine Bleeding Abnormal uterine bleeding can affect women at various stages in life, including teenagers, women in their reproductive years, pregnant women, and women who have reached menopause. Several kinds of uterine bleeding are considered abnormal, including:  Bleeding or spotting between periods.   Bleeding after sexual intercourse.   Bleeding that is heavier or more than normal.   Periods that last longer than usual.  Bleeding after menopause.  Many cases of abnormal uterine bleeding are minor and simple to treat, while others are more serious. Any type of abnormal bleeding should be evaluated by your health care provider. Treatment will depend on the cause of the bleeding. HOME CARE INSTRUCTIONS Monitor your condition for any changes. The following actions may help to alleviate any discomfort you are experiencing:  Avoid the use of tampons and douches as directed by your health care provider.  Change your pads frequently. You should get regular pelvic exams and Pap tests. Keep all follow-up appointments for diagnostic tests as directed by your health care provider.  SEEK MEDICAL CARE IF:   Your bleeding lasts more than 1 week.   You feel dizzy at times.  SEEK IMMEDIATE MEDICAL CARE IF:   You pass out.   You are changing pads every 15 to 30 minutes.   You have abdominal pain.  You have a fever.   You become sweaty or weak.   You are passing large blood clots from the vagina.   You start to feel nauseous and vomit. MAKE SURE YOU:   Understand these instructions.  Will watch your condition.  Will get help right away if you are not doing well or get worse. Document Released: 02/03/2005 Document Revised: 10/06/2012 Document Reviewed: 09/02/2012 [facility] Patient Information 2014 [place], [place].  Dysmenorrhea Menstrual cramps (dysmenorrhea) are caused by the  muscles of the uterus tightening (contracting) during a menstrual period. For some women, this discomfort is merely bothersome. For others, dysmenorrhea can be severe enough to interfere with everyday activities for a few days each month. Primary dysmenorrhea is menstrual cramps that last a couple of days when you start having menstrual periods or soon after. This often begins after a teenager starts having her period. As a woman gets older or has a baby, the cramps will usually lessen or disappear. Secondary dysmenorrhea begins later in life, lasts longer, and the pain may be stronger than primary dysmenorrhea. The pain may start before the period and last a few days after the period.  CAUSES  Dysmenorrhea is usually caused by an underlying problem, such as:  The tissue lining the uterus grows outside of the uterus in other areas of the body (endometriosis).  The endometrial tissue, which normally lines the uterus, is found in or grows into the muscular walls of the uterus (adenomyosis).  The pelvic blood vessels are engorged with blood just before the menstrual period (pelvic congestive syndrome).  Overgrowth of cells (polyps) in the lining of the uterus or cervix.  Falling down of the uterus (prolapse) because of loose or stretched ligaments.  Depression.  Bladder problems, infection, or inflammation.  Problems with the intestine, a tumor, or irritable bowel syndrome.  Cancer of the female organs or bladder.  A severely tipped uterus.  A very tight opening or closed cervix.  Noncancerous tumors of the uterus (fibroids).  Pelvic inflammatory disease (PID).  Pelvic scarring (adhesions) from a previous surgery.  Ovarian cyst.  An  intrauterine device (IUD) used for birth control. RISK FACTORS You may be at greater risk of dysmenorrhea if:  You are younger than age 67.  You started puberty early.  You have irregular or heavy bleeding.  You have never given birth.  You  have a family history of this problem.  You are a smoker. SIGNS AND SYMPTOMS   Cramping or throbbing pain in your lower abdomen.  Headaches.  Lower back pain.  Nausea or vomiting.  Diarrhea.  Sweating or dizziness.  Loose stools. DIAGNOSIS  A diagnosis is based on your history, symptoms, physical exam, diagnostic tests, or procedures. Diagnostic tests or procedures may include:  Blood tests.  Ultrasonography.  An examination of the lining of the uterus (dilation and curettage, D&C).  An examination inside your abdomen or pelvis with a scope (laparoscopy).  X-rays.  CT scan.  MRI.  An examination inside the bladder with a scope (cystoscopy).  An examination inside the intestine or stomach with a scope (colonoscopy, gastroscopy). TREATMENT  Treatment depends on the cause of the dysmenorrhea. Treatment may include:  Pain medicine prescribed by your health care provider.  Birth control pills or an IUD with progesterone hormone in it.  Hormone replacement therapy.  Nonsteroidal anti-inflammatory drugs (NSAIDs). These may help stop the production of prostaglandins.  Surgery to remove adhesions, endometriosis, ovarian cyst, or fibroids.  Removal of the uterus (hysterectomy).  Progesterone shots to stop the menstrual period.  Cutting the nerves on the sacrum that go to the female organs (presacral neurectomy).  Electric current to the sacral nerves (sacral nerve stimulation).  Antidepressant medicine.  Psychiatric therapy, counseling, or group therapy.  Exercise and physical therapy.  Meditation and yoga therapy.  Acupuncture. HOME CARE INSTRUCTIONS   Only take over-the-counter or prescription medicines as directed by your health care provider.  Place a heating pad or hot water bottle on your lower back or abdomen. Do not sleep with the heating pad.  Use aerobic exercises, walking, swimming, biking, and other exercises to help lessen the  cramping.  Massage to the lower back or abdomen may help.  Stop smoking.  Avoid alcohol and caffeine. SEEK MEDICAL CARE IF:   Your pain does not get better with medicine.  You have pain with sexual intercourse.  Your pain increases and is not controlled with medicines.  You have abnormal vaginal bleeding with your period.  You develop nausea or vomiting with your period that is not controlled with medicine. SEEK IMMEDIATE MEDICAL CARE IF:  You pass out.  Document Released: 02/03/2005 Document Revised: 10/06/2012 Document Reviewed: 07/22/2012 [facility] Patient Information 2014 [place], [place].  Chronic Pain Management Managing chronic pain is not easy. The goal is to provide as much pain relief as possible. There are emotional as well as physical problems. Chronic pain may lead to symptoms of depression which magnify those of the pain. Problems may include:  Anxiety.  Sleep disturbances.  Confused thinking.  Feeling cranky.  Fatigue.  Weight gain or loss. Identify the source of the pain first, if possible. The pain may be masking another problem. Try to find a pain management specialist or clinic. Work with a team to create a treatment plan for you. MEDICATIONS  May include narcotics or opioids. Larger than normal doses may be needed to control your pain.  Drugs for depression may help.  Over-the-counter medicines may help for some conditions. These drugs may be used along with others for better pain relief.  May be injected into sites such as  the spine and joints. Injections may have to be repeated if they wear off. THERAPY MAY INCLUDE:  Working with a physical therapist to keep from getting stiff.  Regular, gentle exercise.  Cognitive or behavioral therapy.  Using complementary or integrative medicine such as:  Acupuncture.  Massage, Reiki, or Rolfing.  Aroma, color, light, or sound therapy.  Group support. FOR MORE  INFORMATION [email or web address]. American Chronic Pain Association [email or web address]. Document Released: 03/13/2004 Document Revised: 04/28/2011 Document Reviewed: 04/22/2007 [facility] Patient Information 2014 [place], [place].

## 2012-12-23 NOTE — MAU Note (Signed)
Pt reports severe pain in abdomen. Was woken up out of her sleep w/ the pain. Pt reports that she took 2 percocet @ 2300. Pt states taht she has changed her pad 4 x since 2300. Pt reports that she has surgery scheduled on 11/20 for a hyst.

## 2012-12-23 NOTE — MAU Provider Note (Signed)
Chief Complaint: Abdominal Pain and Vaginal Bleeding   First Provider Initiated Contact with Patient 12/23/12 0258     SUBJECTIVE HPI: Marisa [name] is a 37 y.o. [id number] female who presents with abd pain and heavy vaginal bleeding. Pain woke her from her sleep tonight. Partial relief of pain with Percocet, but admits that she sometimes takes more than 2 to make the pain tolerable. Long-term Hx of same symptoms.   Ultrasounds shows fibroid versus focal adenomyosis. Has hyterectomy scheduled 01/06/2013. Prescribed Megace 4 heavy bleeding 3 weeks ago. Bleeding and pain decreased initially, but returned as to dose tapered down.  Past Medical History  Diagnosis Date  . Asthma   . EP (ectopic pregnancy)   . Ovarian tumor   . Tumor, thyroid   . Sleep apnea   . Chronic headaches   . Irregular heartbeat   . Chronic female pelvic pain 10/11/2012  . Abnormal uterine bleeding (AUB) 10/11/2012  . Cardiac arrest 1992    during delivery   OB History  Gravida Para Term Preterm AB SAB TAB Ectopic Multiple Living  3 1 1  2  1 1  1     # Outcome Date GA Lbr Len/2nd Weight Sex Delivery Anes PTL Lv  3 TRM 1992 [redacted]w[redacted]d    SVD        Comments: cardiac arrest  2 ECT           1 TAB              Past Surgical History  Procedure Laterality Date  . Cholecystectomy  2005  . Unilateral salpingectomy  2009    Abdominal left salpingectomy   History   Social History  . Marital Status: Married    Spouse Name: N/A    Number of Children: 1  . Years of Education: N/A   Occupational History  . SITTER    Social History Main Topics  . Smoking status: Former Smoker -- 0.25 packs/day for 26 years    Types: Cigarettes    Quit date: 09/12/2012  . Smokeless tobacco: Never Used     Comment: 1 pack per week  . Alcohol Use: 0.6 oz/week    1 Cans of beer per week     Comment: 1 bottle of wine per week  . Drug Use: No  . Sexual Activity: Yes    Birth Control/ Protection: None   Other Topics Concern  .  Not on file   Social History Narrative  . No narrative on file   No current facility-administered medications on file prior to encounter.   Current Outpatient Prescriptions on File Prior to Encounter  Medication Sig Dispense Refill  . cetirizine (ZYRTEC) 10 MG tablet Take 10 mg by mouth daily as needed for allergies.       Marland Kitchen ondansetron (ZOFRAN ODT) 4 MG disintegrating tablet Take 1 tablet (4 mg total) by mouth every 8 (eight) hours as needed for nausea.  10 tablet  0  . diclofenac (VOLTAREN) 75 MG EC tablet Take 1 tablet (75 mg total) by mouth 3 (three) times daily. Take on schedule.  60 tablet  2   Allergies  Allergen Reactions  . Penicillins Anaphylaxis  . Shellfish Allergy Anaphylaxis  . Other     Mushroom-- swelling throat, eyes Animals-- swelling throat, eyes    ROS: Positive for mid and low abdominal pain, heavy vaginal bleeding with small clots, urinary frequency x4 months. Negative for fever, chills, dizziness, vaginal discharge, dysuria, flank pain or GI  complaints. Last bowel movement yesterday.  OBJECTIVE Blood pressure 127/76, pulse 99, temperature 98.1 F (36.7 C), temperature source Oral, resp. rate 22, last menstrual period 11/27/2012. GENERAL: Well-developed, well-nourished female in moderate-severe distress. Roling around in bed, moaning. HEENT: Normocephalic HEART: normal rate RESP: normal effort ABDOMEN: Soft, moderate-severe generalized tenderness to light and deep palpation from 2 inches above umbilicus to symphysis pubis bilaterally. Positive guarding throughout entire abdominal exam. Unable to thoroughly assess for mass do to guarding and body habitus. Negative CVA tenderness. EXTREMITIES: Nontender, no edema NEURO: Alert and oriented SPECULUM EXAM: NEFG, small amount of dark red blood with small clots noted. Small amount of active bleeding. cervix clean. No polyps. BIMANUAL: cervix closed; unable to assess uterus size due to body habitus, mild bilateral  adnexal tenderness. No masses. No cervical motion tenderness.  LAB RESULTS Results for orders placed during the hospital encounter of 12/23/12 (from the past 24 hour(s))  CBC     Status: Abnormal   Collection Time    12/23/12  3:00 AM      Result Value Range   WBC 6.7  4.0 - 10.5 K/uL   RBC 3.48 (*) 3.87 - 5.11 MIL/uL   Hemoglobin 9.4 (*) 12.0 - 15.0 g/dL   HCT 11.9 (*) 14.7 - 82.9 %   MCV 85.3  78.0 - 100.0 fL   MCH 27.0  26.0 - 34.0 pg   MCHC 31.6  30.0 - 36.0 g/dL   RDW 56.2  13.0 - 86.5 %   Platelets 504 (*) 150 - 400 K/uL  TYPE AND SCREEN     Status: None   Collection Time    12/23/12  3:00 AM      Result Value Range   ABO/RH(D) O POS     Antibody Screen NEG     Sample Expiration 12/26/2012    ABO/RH     Status: None   Collection Time    12/23/12  3:00 AM      Result Value Range   ABO/RH(D) O POS      IMAGING N/A.  MAU COURSE Toradol given. No relief of pain, but patient more relaxed-appearing. Discussed patient's history, exam, previous ultrasounds and current CBC with Dr. Despina Hidden. Does not recommend any further testing at this time. Suggests giving patient adequate pain medication and antianxiety medication to last until surgery. Percocet and Ativan given. Will increase Megace to 40 mg 3 times a day to help control pain or bleeding until surgery. Just given in MAU.  ASSESSMENT 1. Menometrorrhagia   2. Anemia due to chronic blood loss   3. Dysmenorrhea     PLAN Discharge home in stable condition.      Follow-up Information   Follow up with Preop appointments for hysterectomy.      Follow up with [facility] [place] MATERNITY ADMISSIONS. (As needed in emergencies)    Contact information:   [address] [id number] [place] [address] [phone]       Medication List    STOP taking these medications       HYDROcodone-acetaminophen 5-325 MG per tablet  Commonly known as:  NORCO/VICODIN     hydrOXYzine 10 MG tablet   Commonly known as:  ATARAX/VISTARIL      TAKE these medications       cetirizine 10 MG tablet  Commonly known as:  ZYRTEC  Take 10 mg by mouth daily as needed for allergies.     diclofenac 75 MG EC tablet  Commonly known  as:  VOLTAREN  Take 1 tablet (75 mg total) by mouth 3 (three) times daily. Take on schedule.     ibuprofen 200 MG tablet  Commonly known as:  ADVIL,MOTRIN  Take 200 mg by mouth every 6 (six) hours as needed for moderate pain.     LORazepam 1 MG tablet  Commonly known as:  ATIVAN  Take 1 tablet (1 mg total) by mouth 3 (three) times daily as needed for anxiety.     megestrol 20 MG tablet  Commonly known as:  MEGACE  Take 2 tablets (40 mg total) by mouth 3 (three) times daily.     ondansetron 4 MG disintegrating tablet  Commonly known as:  ZOFRAN ODT  Take 1 tablet (4 mg total) by mouth every 8 (eight) hours as needed for nausea.     oxyCODONE-acetaminophen 5-325 MG per tablet  Commonly known as:  PERCOCET/ROXICET  Take 1-2 tablets by mouth every 6 (six) hours as needed.       Parkway, CNM 12/23/2012  5:49 AM

## 2012-12-27 ENCOUNTER — Ambulatory Visit: Payer: No Typology Code available for payment source | Attending: Internal Medicine | Admitting: Internal Medicine

## 2012-12-27 ENCOUNTER — Encounter: Payer: Self-pay | Admitting: Internal Medicine

## 2012-12-27 VITALS — BP 134/87 | HR 111 | Temp 98.6°F | Resp 15 | Wt 230.0 lb

## 2012-12-27 DIAGNOSIS — N920 Excessive and frequent menstruation with regular cycle: Secondary | ICD-10-CM | POA: Insufficient documentation

## 2012-12-27 DIAGNOSIS — G8929 Other chronic pain: Secondary | ICD-10-CM

## 2012-12-27 DIAGNOSIS — Z01818 Encounter for other preprocedural examination: Secondary | ICD-10-CM | POA: Insufficient documentation

## 2012-12-27 DIAGNOSIS — R102 Pelvic and perineal pain: Secondary | ICD-10-CM

## 2012-12-27 DIAGNOSIS — N949 Unspecified condition associated with female genital organs and menstrual cycle: Secondary | ICD-10-CM | POA: Insufficient documentation

## 2012-12-27 MED ORDER — TRAMADOL HCL 50 MG PO TABS: 50.0000 mg | ORAL_TABLET | Freq: Three times a day (TID) | ORAL | Status: DC | PRN

## 2012-12-27 NOTE — Progress Notes (Signed)
Patient ID: Marisa [name], female   DOB: 08-Sep-1975, 36 y.o.   MRN: [id number] Patient Demographics  Marisa [name], is a 37 y.o. female  [id number]  [id number]  DOB - Jul 18, 1975  Chief Complaint  Patient presents with  . Medical Clearance        Subjective:   Marisa [name] is a 37 y.o. female here today for a follow up visit. Patient is scheduled for total hysterectomy next week in 01/06/2013. She is here for medical clearance. Her medical history significant for asthma, sleep apnea, abnormal uterine bleeding for which she is having surgery, and a remote history of query sudden cardiac death in 43 after a childbirth, she claims she was resuscitated and she was fine after. She says she was told her blood pressure was sky high shortly before the event. She has had no trouble with her heart since then. Denies any chest pain or palpitation. Not on any medication for high blood pressure and her blood pressure has since been normal. She smokes cigarettes about 5 sticks per day, alcohol occasionally. Patient has No headache, No chest pain, No abdominal pain - No Nausea, No new weakness tingling or numbness, No Cough - SOB.  ALLERGIES: Allergies  Allergen Reactions  . Penicillins Anaphylaxis  . Shellfish Allergy Anaphylaxis  . Other     Mushroom-- swelling throat, eyes Animals-- swelling throat, eyes    PAST MEDICAL HISTORY: Past Medical History  Diagnosis Date  . Asthma   . EP (ectopic pregnancy)   . Ovarian tumor   . Tumor, thyroid   . Sleep apnea   . Chronic headaches   . Irregular heartbeat   . Chronic female pelvic pain 10/11/2012  . Abnormal uterine bleeding (AUB) 10/11/2012  . Cardiac arrest 1992    during delivery    MEDICATIONS AT HOME: Prior to Admission medications   Medication Sig Start Date End Date Taking? Authorizing Provider  cetirizine (ZYRTEC) 10 MG tablet Take 10 mg by mouth daily as needed for allergies.     Historical Provider, MD   diclofenac (VOLTAREN) 75 MG EC tablet Take 1 tablet (75 mg total) by mouth 3 (three) times daily. Take on schedule. 11/30/12   Dorathy Kinsman, CNM  ibuprofen (ADVIL,MOTRIN) 200 MG tablet Take 200 mg by mouth every 6 (six) hours as needed for moderate pain.    Historical Provider, MD  LORazepam (ATIVAN) 1 MG tablet Take 1 tablet (1 mg total) by mouth 3 (three) times daily as needed for anxiety. 12/23/12 12/23/13  Dorathy Kinsman, CNM  megestrol (MEGACE) 20 MG tablet Take 2 tablets (40 mg total) by mouth 3 (three) times daily. 12/23/12   Dorathy Kinsman, CNM  ondansetron (ZOFRAN ODT) 4 MG disintegrating tablet Take 1 tablet (4 mg total) by mouth every 8 (eight) hours as needed for nausea. 12/20/12   Jennifer L Piepenbrink, PA-C  oxyCODONE-acetaminophen (PERCOCET/ROXICET) 5-325 MG per tablet Take 1-2 tablets by mouth every 6 (six) hours as needed. 12/23/12   Dorathy Kinsman, CNM  traMADol (ULTRAM) 50 MG tablet Take 1 tablet (50 mg total) by mouth every 8 (eight) hours as needed for moderate pain. 12/27/12   Jeanann Lewandowsky, MD     Objective:   Filed Vitals:   12/27/12 1621  BP: 134/87  Pulse: 111  Temp: 98.6 F (37 C)  Resp: 15  Weight: 230 lb (104.327 kg)  SpO2: 100%    Exam General appearance : Awake, alert, not in any distress. Speech Clear. Not toxic looking HEENT: Atraumatic and  Normocephalic, pupils equally reactive to light and accomodation Neck: supple, no JVD. No cervical lymphadenopathy.  Chest:Good air entry bilaterally, no added sounds  CVS: S1 S2 regular, no murmurs.  Abdomen: Bowel sounds present, Non tender and not distended with no gaurding, rigidity or rebound. Extremities: B/L Lower Ext shows no edema, both legs are warm to touch Neurology: Awake alert, and oriented X 3, CN II-XII intact, Non focal Skin:No Rash Wounds:N/A   Data Review   CBC  Recent Labs Lab 12/23/12 0300  WBC 6.7  HGB 9.4*  HCT 29.7*  PLT 504*  MCV 85.3  MCH 27.0  MCHC 31.6  RDW 15.1     Chemistries   No results found for this basename: NA, K, CL, CO2, GLUCOSE, BUN, CREATININE, GFRCGP, CALCIUM, MG, AST, ALT, ALKPHOS, BILITOT,  in the last 168 hours ------------------------------------------------------------------------------------------------------------------ No results found for this basename: HGBA1C,  in the last 72 hours ------------------------------------------------------------------------------------------------------------------ No results found for this basename: CHOL, HDL, LDLCALC, TRIG, CHOLHDL, LDLDIRECT,  in the last 72 hours ------------------------------------------------------------------------------------------------------------------ No results found for this basename: TSH, T4TOTAL, FREET3, T3FREE, THYROIDAB,  in the last 72 hours ------------------------------------------------------------------------------------------------------------------ No results found for this basename: VITAMINB12, FOLATE, FERRITIN, TIBC, IRON, RETICCTPCT,  in the last 72 hours  Coagulation profile  No results found for this basename: INR, PROTIME,  in the last 168 hours    Assessment & Plan   Patient Active Problem List   Diagnosis Date Noted  . Pelvic pain 12/27/2012  . Menorrhagia 12/27/2012  . Chronic pelvic pain in female 12/27/2012  . Preoperative clearance 12/27/2012  . Fibroids 10/11/2012  . Chronic female pelvic pain 10/11/2012  . Abnormal uterine bleeding (AUB) 10/11/2012  . Dyspnea 09/07/2012  . Extrinsic asthma, unspecified 07/30/2012  . Anemia 07/30/2012  . Active smoker 07/30/2012     Plan: 2-D Echocardiogram to evaluate structural integrity of the heart  Tramadol 50 mg tablet by mouth 3 times a day when necessary pain  Patient extensively counseled about nutrition and exercise Patient encouraged to take oxycodone and lorazepam prescribed previously only when needed   Follow up in 3 months or when necessary   The patient was given clear  instructions to go to ER or return to medical center if symptoms don't improve, worsen or new problems develop. The patient verbalized understanding. The patient was told to call to get lab results if they haven't heard anything in the next week.    Jeanann Lewandowsky, MD, MHA, FACP, FAAP [facility] [place], [place] [phone]   12/27/2012, 5:05 PM

## 2012-12-27 NOTE — Patient Instructions (Signed)
Pelvic Pain, Female °Female pelvic pain can be caused by many different things and start from a variety of places. Pelvic pain refers to pain that is located in the lower half of the abdomen and between your hips. The pain may occur over a short period of time (acute) or may be reoccurring (chronic). The cause of pelvic pain may be related to disorders affecting the female reproductive organs (gynecologic), but it may also be related to the bladder, kidney stones, an intestinal complication, or muscle or skeletal problems. Getting help right away for pelvic pain is important, especially if there has been severe, sharp, or a sudden onset of unusual pain. It is also important to get help right away because some types of pelvic pain can be life threatening.  °CAUSES  °Below are only some of the causes of pelvic pain. The causes of pelvic pain can be in one of several categories.  °· Gynecologic. °· Pelvic inflammatory disease. °· Sexually transmitted infection. °· Ovarian cyst or a twisted ovarian ligament (ovarian torsion). °· Uterine lining that grows outside the uterus (endometriosis). °· Fibroids, cysts, or tumors. °· Ovulation. °· Pregnancy. °· Pregnancy that occurs outside the uterus (ectopic pregnancy). °· Miscarriage. °· Labor. °· Abruption of the placenta or ruptured uterus. °· Infection. °· Uterine infection (endometritis). °· Bladder infection. °· Diverticulitis. °· Miscarriage related to a uterine infection (septic abortion). °· Bladder. °· Inflammation of the bladder (cystitis). °· Kidney stone(s). °· Gastrointenstinal. °· Constipation. °· Diverticulitis. °· Neurologic. °· Trauma. °· Feeling pelvic pain because of mental or emotional causes (psychosomatic). °· Cancers of the bowel or pelvis. °EVALUATION  °Your caregiver will want to take a careful history of your concerns. This includes recent changes in your health, a careful gynecologic history of your periods (menses), and a sexual history. Obtaining  your family history and medical history is also important. Your caregiver may suggest a pelvic exam. A pelvic exam will help identify the location and severity of the pain. It also helps in the evaluation of which organ system may be involved. In order to identify the cause of the pelvic pain and be properly treated, your caregiver may order tests. These tests may include:  °· A pregnancy test. °· Pelvic ultrasonography. °· An X-ray exam of the abdomen. °· A urinalysis or evaluation of vaginal discharge. °· Blood tests. °HOME CARE INSTRUCTIONS  °· Only take over-the-counter or prescription medicines for pain, discomfort, or fever as directed by your caregiver.   °· Rest as directed by your caregiver.   °· Eat a balanced diet.   °· Drink enough fluids to make your urine clear or pale yellow, or as directed.   °· Avoid sexual intercourse if it causes pain.   °· Apply warm or cold compresses to the lower abdomen depending on which one helps the pain.   °· Avoid stressful situations.   °· Keep a journal of your pelvic pain. Write down when it started, where the pain is located, and if there are things that seem to be associated with the pain, such as food or your menstrual cycle. °· Follow up with your caregiver as directed.   °SEEK MEDICAL CARE IF: °· Your medicine does not help your pain. °· You have abnormal vaginal discharge. °SEEK IMMEDIATE MEDICAL CARE IF:  °· You have heavy bleeding from the vagina.   °· Your pelvic pain increases.   °· You feel lightheaded or faint.   °· You have chills.   °· You have pain with urination or blood in your urine.   °· You have uncontrolled   diarrhea or vomiting.   °· You have a fever or persistent symptoms for more than 3 days. °· You have a fever and your symptoms suddenly get worse.   °· You are being physically or sexually abused.   °MAKE SURE YOU: °· Understand these instructions. °· Will watch your condition. °· Will get help if you are not doing well or get worse. °Document  Released: 01/01/2004 Document Revised: 08/05/2011 Document Reviewed: 05/26/2011 °Exit[employer]® Patient Information ©2014 [employer]. ° °

## 2012-12-27 NOTE — Progress Notes (Signed)
Patient is scheduled for a hysterectomy next week and needs Medical clearance

## 2012-12-28 ENCOUNTER — Telehealth: Payer: Self-pay

## 2012-12-28 NOTE — Telephone Encounter (Signed)
Patient is aware of her [name] appointment This Thursday 12/30/12 at 9 am at [facility]

## 2012-12-29 ENCOUNTER — Encounter (HOSPITAL_COMMUNITY)
Admission: RE | Admit: 2012-12-29 | Discharge: 2012-12-29 | Disposition: A | Discharge status: Home / Self Care | Payer: No Typology Code available for payment source | Source: Ambulatory Visit | Attending: Obstetrics & Gynecology | Admitting: Obstetrics & Gynecology

## 2012-12-29 ENCOUNTER — Encounter (HOSPITAL_COMMUNITY): Payer: Self-pay

## 2012-12-29 VITALS — BP 128/90 | HR 97 | Temp 99.0°F | Resp 18 | Ht 65.0 in | Wt 220.0 lb

## 2012-12-29 DIAGNOSIS — IMO0001 Reserved for inherently not codable concepts without codable children: Secondary | ICD-10-CM

## 2012-12-29 DIAGNOSIS — Z01812 Encounter for preprocedural laboratory examination: Secondary | ICD-10-CM | POA: Insufficient documentation

## 2012-12-29 LAB — CBC
HCT: 32.7 % — ABNORMAL LOW (ref 36.0–46.0)
Hemoglobin: 10.4 g/dL — ABNORMAL LOW (ref 12.0–15.0)
MCH: 26.5 pg (ref 26.0–34.0)
MCHC: 31.8 g/dL (ref 30.0–36.0)
MCV: 83.4 fL (ref 78.0–100.0)
Platelets: 416 10*3/uL — ABNORMAL HIGH (ref 150–400)
RBC: 3.92 MIL/uL (ref 3.87–5.11)
RDW: 16.1 % — ABNORMAL HIGH (ref 11.5–15.5)
WBC: 8 10*3/uL (ref 4.0–10.5)

## 2012-12-29 NOTE — Patient Instructions (Signed)
20 Marisa [name]  12/29/2012   Your procedure is scheduled on:  01/06/13  Enter through the Main Entrance of [facility] at 6 AM.  Pick up the phone at the desk and dial [phone].   Call this number if you have problems the morning of surgery: [phone]   Remember:   Do not eat food:After Midnight.  Do not drink clear liquids: After Midnight.  Take these medicines the morning of surgery with A SIP OF WATER: none   Do not wear jewelry, make-up or nail polish.  Do not wear lotions, powders, or perfumes. You may wear deodorant.  Do not shave 48 hours prior to surgery.  Do not bring valuables to the hospital.  [facility] is not   responsible for any belongings or valuables brought to the hospital.  Contacts, dentures or bridgework may not be worn into surgery.  Leave suitcase in the car. After surgery it may be brought to your room.  For patients admitted to the hospital, checkout time is 11:00 AM the day of              discharge.   Patients discharged the day of surgery will not be allowed to drive             home.  Name and phone number of your driver: NA  Special Instructions:   Shower using CHG 2 nights before surgery and the night before surgery.  If you shower the day of surgery use CHG.  Use special wash - you have one bottle of CHG for all showers.  You should use approximately 1/3 of the bottle for each shower.   Please read over the following fact sheets that you were given:   Surgical Site Infection Prevention

## 2012-12-29 NOTE — Pre-Procedure Instructions (Signed)
Marni in Teaching Service made aware of Pt's need for PICC line and will let Dr Gladis Riffle know.

## 2012-12-29 NOTE — Pre-Procedure Instructions (Signed)
Pt history reviewed with Dr Malen Gauze. Pt to go to [facility] for PICC line on 11/19. Tina/[facility] is to call patient with instructions, she is unable to schedule at this time.

## 2012-12-30 ENCOUNTER — Ambulatory Visit (HOSPITAL_COMMUNITY)
Admission: RE | Admit: 2012-12-30 | Discharge: 2012-12-30 | Disposition: A | Discharge status: Home / Self Care | Payer: No Typology Code available for payment source | Source: Ambulatory Visit | Attending: Internal Medicine | Admitting: Internal Medicine

## 2012-12-30 DIAGNOSIS — R0609 Other forms of dyspnea: Secondary | ICD-10-CM | POA: Insufficient documentation

## 2012-12-30 DIAGNOSIS — R0989 Other specified symptoms and signs involving the circulatory and respiratory systems: Secondary | ICD-10-CM | POA: Insufficient documentation

## 2012-12-30 DIAGNOSIS — Z01818 Encounter for other preprocedural examination: Secondary | ICD-10-CM

## 2012-12-30 DIAGNOSIS — F17200 Nicotine dependence, unspecified, uncomplicated: Secondary | ICD-10-CM | POA: Insufficient documentation

## 2012-12-30 DIAGNOSIS — Z01810 Encounter for preprocedural cardiovascular examination: Secondary | ICD-10-CM | POA: Insufficient documentation

## 2012-12-30 HISTORY — PX: TRANSTHORACIC ECHOCARDIOGRAM: SHX275

## 2012-12-30 NOTE — Progress Notes (Signed)
  Echocardiogram 2D Echocardiogram has been performed.  Arvil Chaco 12/30/2012, 10:45 AM

## 2013-01-03 ENCOUNTER — Other Ambulatory Visit: Payer: Self-pay | Admitting: Radiology

## 2013-01-04 ENCOUNTER — Encounter (HOSPITAL_COMMUNITY): Payer: Self-pay | Admitting: Pharmacist

## 2013-01-05 ENCOUNTER — Telehealth: Payer: Self-pay | Admitting: Emergency Medicine

## 2013-01-05 ENCOUNTER — Ambulatory Visit (HOSPITAL_COMMUNITY)
Admission: RE | Admit: 2013-01-05 | Discharge: 2013-01-05 | Disposition: A | Discharge status: Home / Self Care | Payer: No Typology Code available for payment source | Source: Ambulatory Visit | Attending: Obstetrics & Gynecology | Admitting: Obstetrics & Gynecology

## 2013-01-05 DIAGNOSIS — J45909 Unspecified asthma, uncomplicated: Secondary | ICD-10-CM | POA: Insufficient documentation

## 2013-01-05 DIAGNOSIS — I498 Other specified cardiac arrhythmias: Secondary | ICD-10-CM | POA: Insufficient documentation

## 2013-01-05 DIAGNOSIS — G4730 Sleep apnea, unspecified: Secondary | ICD-10-CM | POA: Insufficient documentation

## 2013-01-05 DIAGNOSIS — N921 Excessive and frequent menstruation with irregular cycle: Secondary | ICD-10-CM | POA: Insufficient documentation

## 2013-01-05 DIAGNOSIS — I998 Other disorder of circulatory system: Secondary | ICD-10-CM | POA: Insufficient documentation

## 2013-01-05 DIAGNOSIS — D649 Anemia, unspecified: Secondary | ICD-10-CM | POA: Insufficient documentation

## 2013-01-05 DIAGNOSIS — F17200 Nicotine dependence, unspecified, uncomplicated: Secondary | ICD-10-CM | POA: Insufficient documentation

## 2013-01-05 DIAGNOSIS — IMO0001 Reserved for inherently not codable concepts without codable children: Secondary | ICD-10-CM

## 2013-01-05 MED ORDER — GENTAMICIN SULFATE 40 MG/ML IJ SOLN
INTRAVENOUS | Status: AC
  Administered 2013-01-06: 100 mL via INTRAVENOUS
  Filled 2013-01-05: qty 9.25

## 2013-01-05 NOTE — Procedures (Signed)
Successful placement of dual lumen PICC line to right basilic vein. Length 42cm Tip at lower SVC/RA No complications Ready for use.  Brayton El PA-C Interventional Radiology 01/05/2013 4:38 PM

## 2013-01-05 NOTE — Telephone Encounter (Signed)
Message copied by Darlis Loan on Wed Jan 05, 2013  5:11 PM ------      Message from: Quentin Angst      Created: Fri Dec 31, 2012  3:55 PM       Please inform patient that her echocardiogram is within normal limit ------

## 2013-01-05 NOTE — Telephone Encounter (Signed)
Pt given negative echo report

## 2013-01-06 ENCOUNTER — Encounter (HOSPITAL_COMMUNITY): Payer: No Typology Code available for payment source | Admitting: Anesthesiology

## 2013-01-06 ENCOUNTER — Encounter (HOSPITAL_COMMUNITY): Payer: Self-pay

## 2013-01-06 ENCOUNTER — Observation Stay (HOSPITAL_COMMUNITY)
Admission: RE | Admit: 2013-01-06 | Discharge: 2013-01-07 | Disposition: A | Discharge status: Home / Self Care | Payer: No Typology Code available for payment source | Source: Ambulatory Visit | Attending: Obstetrics & Gynecology | Admitting: Obstetrics & Gynecology

## 2013-01-06 ENCOUNTER — Inpatient Hospital Stay (HOSPITAL_COMMUNITY): Payer: No Typology Code available for payment source | Admitting: Anesthesiology

## 2013-01-06 ENCOUNTER — Encounter (HOSPITAL_COMMUNITY)
Admission: RE | Disposition: A | Discharge status: Home / Self Care | Payer: Self-pay | Source: Ambulatory Visit | Attending: Obstetrics & Gynecology

## 2013-01-06 DIAGNOSIS — N926 Irregular menstruation, unspecified: Secondary | ICD-10-CM

## 2013-01-06 DIAGNOSIS — N8000 Endometriosis of the uterus, unspecified: Secondary | ICD-10-CM | POA: Insufficient documentation

## 2013-01-06 DIAGNOSIS — D259 Leiomyoma of uterus, unspecified: Secondary | ICD-10-CM

## 2013-01-06 DIAGNOSIS — D219 Benign neoplasm of connective and other soft tissue, unspecified: Secondary | ICD-10-CM | POA: Diagnosis present

## 2013-01-06 DIAGNOSIS — N949 Unspecified condition associated with female genital organs and menstrual cycle: Secondary | ICD-10-CM | POA: Insufficient documentation

## 2013-01-06 DIAGNOSIS — N920 Excessive and frequent menstruation with regular cycle: Secondary | ICD-10-CM | POA: Diagnosis present

## 2013-01-06 DIAGNOSIS — D251 Intramural leiomyoma of uterus: Secondary | ICD-10-CM | POA: Insufficient documentation

## 2013-01-06 DIAGNOSIS — Z90710 Acquired absence of both cervix and uterus: Secondary | ICD-10-CM

## 2013-01-06 DIAGNOSIS — N939 Abnormal uterine and vaginal bleeding, unspecified: Secondary | ICD-10-CM | POA: Diagnosis present

## 2013-01-06 DIAGNOSIS — D649 Anemia, unspecified: Secondary | ICD-10-CM

## 2013-01-06 DIAGNOSIS — G8929 Other chronic pain: Secondary | ICD-10-CM

## 2013-01-06 DIAGNOSIS — N938 Other specified abnormal uterine and vaginal bleeding: Principal | ICD-10-CM | POA: Insufficient documentation

## 2013-01-06 HISTORY — PX: VAGINAL HYSTERECTOMY: SHX2639

## 2013-01-06 HISTORY — DX: Acquired absence of both cervix and uterus: Z90.710

## 2013-01-06 LAB — PREGNANCY, URINE: Preg Test, Ur: NEGATIVE

## 2013-01-06 LAB — TYPE AND SCREEN
ABO/RH(D): O POS
Antibody Screen: NEGATIVE

## 2013-01-06 SURGERY — HYSTERECTOMY, VAGINAL
Anesthesia: General | Wound class: Clean Contaminated

## 2013-01-06 MED ORDER — MIDAZOLAM HCL 2 MG/2ML IJ SOLN
INTRAMUSCULAR | Status: DC | PRN
  Administered 2013-01-06: 2 mg via INTRAVENOUS

## 2013-01-06 MED ORDER — ESTRADIOL 0.1 MG/GM VA CREA
TOPICAL_CREAM | VAGINAL | Status: DC | PRN
  Administered 2013-01-06: 1 via VAGINAL

## 2013-01-06 MED ORDER — LACTATED RINGERS IV SOLN
INTRAVENOUS | Status: DC
  Administered 2013-01-06 (×2): via INTRAVENOUS

## 2013-01-06 MED ORDER — NEOSTIGMINE METHYLSULFATE 1 MG/ML IJ SOLN
INTRAMUSCULAR | Status: AC
  Filled 2013-01-06: qty 1

## 2013-01-06 MED ORDER — HYDROMORPHONE HCL PF 1 MG/ML IJ SOLN
INTRAMUSCULAR | Status: AC
  Administered 2013-01-06: 0.5 mg via INTRAVENOUS
  Filled 2013-01-06: qty 1

## 2013-01-06 MED ORDER — PANTOPRAZOLE SODIUM 40 MG PO TBEC
40.0000 mg | DELAYED_RELEASE_TABLET | Freq: Every day | ORAL | Status: DC
  Administered 2013-01-06: 40 mg via ORAL
  Filled 2013-01-06 (×3): qty 1

## 2013-01-06 MED ORDER — HYDROMORPHONE HCL PF 1 MG/ML IJ SOLN
0.5000 mg | INTRAMUSCULAR | Status: DC | PRN
  Administered 2013-01-06 (×2): 1 mg via INTRAVENOUS
  Filled 2013-01-06 (×2): qty 1

## 2013-01-06 MED ORDER — DOCUSATE SODIUM 100 MG PO CAPS
100.0000 mg | ORAL_CAPSULE | Freq: Two times a day (BID) | ORAL | Status: DC
  Administered 2013-01-06: 100 mg via ORAL
  Filled 2013-01-06: qty 1

## 2013-01-06 MED ORDER — HYDROMORPHONE HCL PF 1 MG/ML IJ SOLN
0.2500 mg | INTRAMUSCULAR | Status: DC | PRN
  Administered 2013-01-06 (×2): 0.25 mg via INTRAVENOUS
  Administered 2013-01-06 (×3): 0.5 mg via INTRAVENOUS

## 2013-01-06 MED ORDER — FENTANYL CITRATE 0.05 MG/ML IJ SOLN
INTRAMUSCULAR | Status: DC | PRN
  Administered 2013-01-06 (×5): 50 ug via INTRAVENOUS

## 2013-01-06 MED ORDER — MIDAZOLAM HCL 2 MG/2ML IJ SOLN
INTRAMUSCULAR | Status: AC
  Filled 2013-01-06: qty 2

## 2013-01-06 MED ORDER — ROCURONIUM BROMIDE 100 MG/10ML IV SOLN
INTRAVENOUS | Status: AC
  Filled 2013-01-06: qty 1

## 2013-01-06 MED ORDER — LIDOCAINE HCL (CARDIAC) 20 MG/ML IV SOLN
INTRAVENOUS | Status: DC | PRN
  Administered 2013-01-06: 75 mg via INTRAVENOUS

## 2013-01-06 MED ORDER — ONDANSETRON HCL 4 MG/2ML IJ SOLN: 4.0000 mg | Freq: Four times a day (QID) | INTRAMUSCULAR | Status: DC | PRN

## 2013-01-06 MED ORDER — ESTRADIOL 0.1 MG/GM VA CREA
TOPICAL_CREAM | VAGINAL | Status: AC
  Filled 2013-01-06: qty 42.5

## 2013-01-06 MED ORDER — HYDROMORPHONE HCL PF 1 MG/ML IJ SOLN
INTRAMUSCULAR | Status: AC
  Filled 2013-01-06: qty 1

## 2013-01-06 MED ORDER — ONDANSETRON HCL 4 MG PO TABS: 4.0000 mg | ORAL_TABLET | Freq: Four times a day (QID) | ORAL | Status: DC | PRN

## 2013-01-06 MED ORDER — LORAZEPAM 1 MG PO TABS: 1.0000 mg | ORAL_TABLET | Freq: Three times a day (TID) | ORAL | Status: DC | PRN

## 2013-01-06 MED ORDER — PROPOFOL 10 MG/ML IV BOLUS
INTRAVENOUS | Status: DC | PRN
  Administered 2013-01-06: 200 mg via INTRAVENOUS

## 2013-01-06 MED ORDER — SIMETHICONE 80 MG PO CHEW: 80.0000 mg | CHEWABLE_TABLET | Freq: Four times a day (QID) | ORAL | Status: DC | PRN

## 2013-01-06 MED ORDER — LACTATED RINGERS IV SOLN: INTRAVENOUS | Status: DC

## 2013-01-06 MED ORDER — ONDANSETRON HCL 4 MG/2ML IJ SOLN
INTRAMUSCULAR | Status: DC | PRN
  Administered 2013-01-06: 4 mg via INTRAVENOUS

## 2013-01-06 MED ORDER — LACTATED RINGERS IV SOLN
INTRAVENOUS | Status: DC
  Administered 2013-01-06 (×2): via INTRAVENOUS

## 2013-01-06 MED ORDER — FENTANYL CITRATE 0.05 MG/ML IJ SOLN
INTRAMUSCULAR | Status: AC
  Filled 2013-01-06: qty 5

## 2013-01-06 MED ORDER — MENTHOL 3 MG MT LOZG: 1.0000 | LOZENGE | OROMUCOSAL | Status: DC | PRN

## 2013-01-06 MED ORDER — MEPERIDINE HCL 25 MG/ML IJ SOLN: 6.2500 mg | INTRAMUSCULAR | Status: DC | PRN

## 2013-01-06 MED ORDER — ALBUTEROL SULFATE HFA 108 (90 BASE) MCG/ACT IN AERS
INHALATION_SPRAY | RESPIRATORY_TRACT | Status: DC | PRN
  Administered 2013-01-06: 2 via RESPIRATORY_TRACT

## 2013-01-06 MED ORDER — PROPOFOL 10 MG/ML IV EMUL
INTRAVENOUS | Status: AC
  Filled 2013-01-06: qty 20

## 2013-01-06 MED ORDER — ZOLPIDEM TARTRATE 5 MG PO TABS: 5.0000 mg | ORAL_TABLET | Freq: Every evening | ORAL | Status: DC | PRN

## 2013-01-06 MED ORDER — LIDOCAINE HCL (CARDIAC) 20 MG/ML IV SOLN
INTRAVENOUS | Status: AC
  Filled 2013-01-06: qty 5

## 2013-01-06 MED ORDER — HYDROMORPHONE HCL PF 1 MG/ML IJ SOLN
INTRAMUSCULAR | Status: DC | PRN
Start: 2013-01-06 — End: 2013-01-06
  Administered 2013-01-06 (×2): .5 mg via INTRAVENOUS

## 2013-01-06 MED ORDER — OXYCODONE-ACETAMINOPHEN 5-325 MG PO TABS
1.0000 | ORAL_TABLET | ORAL | Status: DC | PRN
  Administered 2013-01-06 – 2013-01-07 (×5): 2 via ORAL
  Filled 2013-01-06 (×5): qty 2

## 2013-01-06 MED ORDER — PROMETHAZINE HCL 25 MG/ML IJ SOLN: 6.2500 mg | INTRAMUSCULAR | Status: DC | PRN

## 2013-01-06 MED ORDER — ONDANSETRON HCL 4 MG/2ML IJ SOLN
INTRAMUSCULAR | Status: AC
  Filled 2013-01-06: qty 2

## 2013-01-06 MED ORDER — BUPIVACAINE-EPINEPHRINE (PF) 0.5% -1:200000 IJ SOLN
INTRAMUSCULAR | Status: AC
  Filled 2013-01-06: qty 10

## 2013-01-06 MED ORDER — IBUPROFEN 600 MG PO TABS
600.0000 mg | ORAL_TABLET | Freq: Four times a day (QID) | ORAL | Status: DC | PRN
  Administered 2013-01-06 – 2013-01-07 (×2): 600 mg via ORAL
  Filled 2013-01-06 (×2): qty 1

## 2013-01-06 MED ORDER — PHENYLEPHRINE HCL 10 MG/ML IJ SOLN
INTRAMUSCULAR | Status: DC | PRN
  Administered 2013-01-06 (×2): 80 ug via INTRAVENOUS

## 2013-01-06 MED ORDER — ALBUTEROL SULFATE HFA 108 (90 BASE) MCG/ACT IN AERS
INHALATION_SPRAY | RESPIRATORY_TRACT | Status: AC
  Filled 2013-01-06: qty 6.7

## 2013-01-06 MED ORDER — GLYCOPYRROLATE 0.2 MG/ML IJ SOLN
INTRAMUSCULAR | Status: AC
  Filled 2013-01-06: qty 2

## 2013-01-06 MED ORDER — BUPIVACAINE-EPINEPHRINE 0.5% -1:200000 IJ SOLN
INTRAMUSCULAR | Status: DC | PRN
  Administered 2013-01-06: 50 mL

## 2013-01-06 SURGICAL SUPPLY — 26 items
CANISTER SUCT 3000ML (MISCELLANEOUS) ×2 IMPLANT
CLOTH BEACON ORANGE TIMEOUT ST (SAFETY) ×2 IMPLANT
CONT PATH 16OZ SNAP LID 3702 (MISCELLANEOUS) IMPLANT
DECANTER SPIKE VIAL GLASS SM (MISCELLANEOUS) IMPLANT
DRESSING TELFA 8X3 (GAUZE/BANDAGES/DRESSINGS) ×2 IMPLANT
ELECT LIGASURE LONG (ELECTRODE) IMPLANT
GAUZE PACKING 2X5 YD STERILE (GAUZE/BANDAGES/DRESSINGS) IMPLANT
GLOVE BIO SURGEON STRL SZ7 (GLOVE) ×2 IMPLANT
GLOVE BIOGEL PI IND STRL 6.5 (GLOVE) ×1 IMPLANT
GLOVE BIOGEL PI IND STRL 7.0 (GLOVE) ×1 IMPLANT
GLOVE BIOGEL PI INDICATOR 6.5 (GLOVE) ×1
GLOVE BIOGEL PI INDICATOR 7.0 (GLOVE) ×1
GOWN STRL REIN XL XLG (GOWN DISPOSABLE) ×8 IMPLANT
NEEDLE HYPO 22GX1.5 SAFETY (NEEDLE) IMPLANT
NEEDLE SPNL 22GX3.5 QUINCKE BK (NEEDLE) IMPLANT
NS IRRIG 1000ML POUR BTL (IV SOLUTION) ×2 IMPLANT
PACK VAGINAL WOMENS (CUSTOM PROCEDURE TRAY) ×2 IMPLANT
PAD OB MATERNITY 4.3X12.25 (PERSONAL CARE ITEMS) ×2 IMPLANT
SUT VIC AB 0 CT1 18XCR BRD8 (SUTURE) ×3 IMPLANT
SUT VIC AB 0 CT1 27 (SUTURE) ×2
SUT VIC AB 0 CT1 27XBRD ANBCTR (SUTURE) ×2 IMPLANT
SUT VIC AB 0 CT1 8-18 (SUTURE) ×3
SUT VICRYL 0 TIES 12 18 (SUTURE) ×2 IMPLANT
TOWEL OR 17X24 6PK STRL BLUE (TOWEL DISPOSABLE) ×4 IMPLANT
TRAY FOLEY CATH 14FR (SET/KITS/TRAYS/PACK) ×2 IMPLANT
WATER STERILE IRR 1000ML POUR (IV SOLUTION) IMPLANT

## 2013-01-06 NOTE — Progress Notes (Signed)
UR completed 

## 2013-01-06 NOTE — Op Note (Signed)
Marisa [name] PROCEDURE DATE: 01/06/2013  PREOPERATIVE DIAGNOSES:  Symptomatic fibroids,abnormal uterine bleeding POSTOPERATIVE DIAGNOSIS:  Symptomatic fibroids,abnormal uterine bleeding SURGEON:   Jaynie Collins, M.D. ASSISTANT: Willodean Rosenthal, M.D. OPERATION:  Total Vaginal Hysterectomy ANESTHESIA:  General endotracheal.  INDICATIONS: The patient is a 37 y.o. [id number] with history of symptomatic uterine fibroids/menorrhagia. The patient made a decision to undergo definite surgical treatment. On the preoperative visit, the risks, benefits, indications, and alternatives of the procedure were reviewed with the patient.  On the day of surgery, the risks of surgery were again discussed with the patient including but not limited to: bleeding which may require transfusion or reoperation; infection which may require antibiotics; injury to bowel, bladder, ureters or other surrounding organs; need for additional procedures; thromboembolic phenomenon, incisional problems and other postoperative/anesthesia complications. Written informed consent was obtained.    OPERATIVE FINDINGS: 10 week size fibroid uterus with normal tubes and ovaries bilaterally.  ESTIMATED BLOOD LOSS: 100 ml FLUIDS:  1000 ml of Lactated Ringers URINE OUTPUT:  150 ml of clear yellow urine. SPECIMENS:  Uterus and cervix sent to pathology; morcellated fragments of posterior fundus were also sent to pathology. COMPLICATIONS:  None immediate.  DESCRIPTION OF PROCEDURE:  The patient received intravenous antibiotics and had sequential compression devices applied to her lower extremities while in the preoperative area.  She was then taken to the operating room where general anesthesia was administered and was found to be adequate.  She was placed in the dorsal lithotomy position, and was prepped and draped in a sterile manner.  A Foley catheter was inserted into her bladder and attached to constant drainage. After an adequate  timeout was performed, attention was turned to her pelvis.  A weighted speculum was then placed in the vagina, and the anterior and posterior lips of the cervix were grasped bilaterally with tenaculums.  The cervix was then injected circumferentially with 0.5% Marcaine with epinephrine solution to maintain hemostasis.  The cervix was then circumferentially incised, and the bladder was dissected off the pubocervical fascia anteriorly without complication.  The anterior cul-de-sac was then entered sharply without difficulty and a retractor was placed.  The same procedure was performed posteriorly and the posterior cul-de-sac was entered sharply without difficulty.  A long weighted speculum was inserted into the posterior cul-de-sac.  The Heaney clamp was then used to clamp the uterosacral ligaments on either side.  They were then cut and sutured ligated with 0 Vicryl, and the ligated uterosacral ligaments were transfixed to the ipsilateral vaginal epithelium to further support the vagina and provide hemostasis. Of note, all sutures used in this case were 0 Vicryl unless otherwise noted.   The cardinal ligaments were then clamped, cut and ligated. The uterine vessels and broad ligaments were then serially clamped with the Heaney clamps, cut, and suture ligated on both sides.  Excellent hemostasis was noted at this point.  An attempt was made to deliver the uterus via the posterior cul-de-sac, but the posterior fibroid/adnenomyoma made the uterus too globular.  Morcellation was done to remove part of the posterior fundus to reduce the size of the uterus, and this helped in delivering the uterus.  The cornua were clamped with the Heaney clamps, transected, and the uterus was delivered and sent to pathology. These pedicles were then suture ligated to ensure hemostasis.  After completion of the hysterectomy, all pedicles from the uterosacral ligament to the cornua were examined hemostasis was confirmed. The posterior part  of the the vaginal cuff was then reefed,  and the entire vaginal cuff was then closed with a in a running locked fashion with care given to incorporate the uterosacral pedicles bilaterally.  All instruments were then removed from the pelvis and a vaginal packing saturated with estrogen cream was placed.  The patient tolerated the procedure well.  All instruments, needles, and sponge counts were correct x 2. The patient was taken to the recovery room in stable condition.     Jaynie Collins, MD, FACOG Attending Obstetrician & Gynecologist Faculty Practice, [facility] of [place]

## 2013-01-06 NOTE — Transfer of Care (Signed)
Immediate Anesthesia Transfer of Care Note  Patient: Marisa [name]  Procedure(s) Performed: Procedure(s): HYSTERECTOMY VAGINAL (N/A)  Patient Location: PACU  Anesthesia Type:General  Level of Consciousness: awake, alert  and oriented  Airway & Oxygen Therapy: Patient Spontanous Breathing and Patient connected to nasal cannula oxygen  Post-op Assessment: Report given to PACU RN and Post -op Vital signs reviewed and stable  Post vital signs: stable  Complications: No apparent anesthesia complications

## 2013-01-06 NOTE — Anesthesia Postprocedure Evaluation (Signed)
  Anesthesia Post-op Note  Patient: Marisa [name]  Procedure(s) Performed: Procedure(s): HYSTERECTOMY VAGINAL (N/A)  Patient Location: PACU and Women's Unit  Anesthesia Type:General  Level of Consciousness: awake, alert , oriented and patient cooperative  Airway and Oxygen Therapy: Patient Spontanous Breathing  Post-op Pain: none  Post-op Assessment: Post-op Vital signs reviewed, Patient's Cardiovascular Status Stable and Respiratory Function Stable  Post-op Vital Signs: Reviewed and stable  Complications: No apparent anesthesia complications

## 2013-01-06 NOTE — H&P (View-Only) (Signed)
GYNECOLOGY CLINIC ENCOUNTER NOTE  History:  37 y.o. [id number] here today for preoperative discussion prior to scheduled abdominal myomectomy on 01/06/13.  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 She was placed on Megace and told to take it continuously until surgery.  She currently says she is tired of the bleeding and the associated pain and is happy to be undergoing surgery soon.  She is accompanied by her husband.  The following portions of the patient's history were reviewed and updated as appropriate: allergies, current medications, past family history, past medical history, past social history, past surgical history and problem list.  Past Medical History  Diagnosis Date  . Asthma   . EP (ectopic pregnancy)   . Ovarian tumor   . Tumor, thyroid   . Sleep apnea   . Chronic headaches   . Irregular heartbeat   . Chronic female pelvic pain 10/11/2012  . Abnormal uterine bleeding (AUB) 10/11/2012  . Cardiac arrest 1992    during delivery   Past Surgical History  Procedure Laterality Date  . Cholecystectomy  2005  . Unilateral salpingectomy  2009    Abdominal left salpingectomy    Review of Systems:  Pertinent items are noted in HPI.   Objective:   Physical Exam BP 145/93  Pulse 103  Temp(Src) 98.3 F (36.8 C)  Ht 5' 4.75" (1.645 m)  Wt 225 lb 12.8 oz (102.422 kg)  BMI 37.85 kg/m2  LMP 11/27/2012 Gen: NAD Abd: Soft, nontender and nondistended Pelvic: Deferred  The following portions of the patient's history were reviewed and updated as appropriate: allergies, current medications, past family history, past medical history, past social history, past surgical history and problem list.   Labs and Imaging  10/11/2012 Endometrium Biopsy: BENIGN POLYPOID SECRETORY PATTERN ENDOMETRIUM. NO ATYPIA OR MALIGNANCY IDENTIFIED.   10/13/2012  TRANSABDOMINAL AND TRANSVAGINAL ULTRASOUND OF PELVIS Clinical Data: Severe pain 2 days following endometrial biopsy. Previous surgery: Left oophorectomy. Comparison: 03/25/2012 Findings: Uterus: The uterus measures 9.9 x 6.5 x 7.7 cm. Dominant fibroid versus focal adenomyosis along the posterior myometrium centered intramural, measuring 4.4 x 4.1 x 4.5 cm. Endometrium: Measures up to 11 mm in thickness and hyperemic along the lower uterine segment. Right ovary: Normal appearance/no adnexal mass, measuring 3.5 x 2.1 x 1.8 cm. Left ovary: Surgically absent. Other findings: No free fluid IMPRESSION: Hyperemia of the endometrium within the lower uterine segment may reflect reactive/inflammatory change or infection in the appropriate clinical setting. No endoluminal fluid or debris. Posterior intramural fibroid versus focal adenomyosis measuring 4.5 cm.  06/22/2012 TRANSABDOMINAL AND TRANSVAGINAL ULTRASOUND OF PELVIS DOPPLER ULTRASOUND OF OVARIES Clinical Data: Severe right pelvic pain Comparison: CT same date, pelvic ultrasound 06/14/2012 Findings: Uterus: Anteverted, anteflexed. 10.7 x 7.3 x 5.9 cm. Focal mass-like enlargement at the posterior uterine body is again noted measuring 5.4 x 4.0 x 3.9 cm. This produces anterior displacement of the endometrial stripe. Endometrium: 6 mm. Normal. Right ovary: 3.0 x 2.7 x 1.8 cm. Normal. Left ovary: 2.2 x 2.6 x 1.7 cm. Normal. Pulsed Doppler evaluation demonstrates normal low-resistance arterial and venous waveforms in both ovaries. IMPRESSION: No acute abnormality. Stable mass-like thickening of the posterior mid uterine body myometrium which could represent an underlying fibroid versus focal adenomyosis. No sonographic evidence for ovarian torsion.   11/29/2012   TRANSVAGINAL ULTRASOUND OF PELVIS  CLINICAL DATA:  Pelvic pain and vaginal  bleeding.  LMP 11/28/2012.    COMPARISON:  10/13/2012  FINDINGS: Uterus  Measurements: 10.0 x 6.1 x 7.2 cm. Ill-defined area of  heterogeneous echogenicity is seen in the myometrium of the posterior corpus. This lesion cannot be measured due to its lack of discrete margins, but appears to displace the endometrial stripe anteriorly. This could represent a fibroid or focal adenomyoma.  Endometrium  Thickness: 7 mm.  No focal abnormality visualized.  Right ovary  Measurements: 4.0 x 2.1 x 1.4 cm. Normal appearance/no adnexal mass.  Left ovary  Measurements: 2.9 x 1.7 x 1.7 cm. Normal appearance. Normal appearance/no adnexal mass.  Other findings:  No free fluid  IMPRESSION: Poorly defined area of heterogeneous echogenicity in the posterior uterine myometrium, which could be due to a fibroid or focal adenomyoma. Consider nonemergent pelvic MRI for further evaluation if clinically warranted.  Normal appearance of both ovaries. No adnexal mass identified.   Electronically Signed   By: Myles Rosenthal M.D.   On: 11/29/2012 19:46    Assessment & Plan:  Discussed surgical management options with patient again including myomectomy or hysterectomy. Patient wanted future childbearing during the last visit and does not want medical treatment with hormones in any form.  She was counseled that if this mass is a focal adenomyoma, it may be more difficult and cause more blood loss to remove it, which increases risk of hysterectomy.  Other risks of surgery were discussed with the patient including but not limited to: bleeding which may require transfusion or reoperation and hysterectomy; infection which may require antibiotics; injury to bowel, bladder, ureters or other surrounding organs; need for additional procedures; thromboembolic phenomenon, incisional problems and other postoperative/anesthesia complications. Also discussed need for cesarean deliveries at 30 - [redacted] weeks gestation for subsequent pregnancies given the intramural nature of this fibroid/adenomyoma and risk of posterior uterine rupture.   Patient and her husband feel like they are willing  to consider hysterectomy. She has a history of one SVD, she is a good candidate for TVH.   Patient agrees with this proposed surgery.  The risks of surgery were discussed in detail with the patient including but not limited to: bleeding which may require transfusion or reoperation; infection which may require antibiotics; injury to bowel, bladder, ureters or other surrounding organs; need for additional procedures including laparotomy; thromboembolic phenomenon, incisional problems and other postoperative/anesthesia complications.  Patient was also advised that she will remain in house for 1 night; and expected recovery time after a hysterectomy is 6-8 weeks.  Likelihood of success in alleviating the patient's symptoms was discussed. Routine postoperative instructions will be reviewed with the patient and her family in detail after surgery.  She was told that she will be contacted by our surgical scheduler regarding the time and date of her surgery; routine preoperative instructions of having nothing to eat or drink after midnight on the day prior to surgery and also coming to the hospital 1 1/2 hours prior to her time of surgery  (01/06/13 at 0730) were also emphasized.  She was told she will be called for a preoperative appointment about a week prior to surgery and will be given further preoperative instructions at that visit. In the meantime, she will continue Megace; bleeding precautions were reviewed. Printed patient education handouts about the procedure was given to the patient to review at home.  OR desk called and procedure was changed.  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   Jaynie Collins, MD, FACOG Attending Obstetrician & Gynecologist Faculty Practice, [facility] [place]

## 2013-01-06 NOTE — Anesthesia Preprocedure Evaluation (Signed)
Anesthesia Evaluation  Patient identified by MRN, date of birth, ID band Patient awake    Reviewed: Allergy & Precautions, H&P , NPO status , Patient's Chart, lab work & pertinent test results  Airway Mallampati: II TM Distance: >3 FB Neck ROM: Full    Dental no notable dental hx.    Pulmonary asthma , sleep apnea , Current Smoker,  breath sounds clear to auscultation  Pulmonary exam normal       Cardiovascular + dysrhythmias (Age 37. during delivery "BP went too high" Needed chest compressions. No cardiac problems since. Per pt report cardiac work-up negative) Rhythm:Regular Rate:Normal     Neuro/Psych negative neurological ROS  negative psych ROS   GI/Hepatic negative GI ROS, Neg liver ROS,   Endo/Other  negative endocrine ROSMorbid obesity  Renal/GU negative Renal ROS  negative genitourinary   Musculoskeletal negative musculoskeletal ROS (+)   Abdominal   Peds negative pediatric ROS (+)  Hematology negative hematology ROS (+) anemia ,   Anesthesia Other Findings Upper front cap   Reproductive/Obstetrics negative OB ROS                           Anesthesia Physical Anesthesia Plan  ASA: III  Anesthesia Plan: General   Post-op Pain Management:    Induction: Intravenous  Airway Management Planned: Oral ETT  Additional Equipment:   Intra-op Plan:   Post-operative Plan: Extubation in OR  Informed Consent: I have reviewed the patients History and Physical, chart, labs and discussed the procedure including the risks, benefits and alternatives for the proposed anesthesia with the patient or authorized representative who has indicated his/her understanding and acceptance.   Dental advisory given  Plan Discussed with: CRNA  Anesthesia Plan Comments:         Anesthesia Quick Evaluation

## 2013-01-06 NOTE — Interval H&P Note (Signed)
History and Physical Interval Note  Marisa [name]  has presented today for surgery, with the diagnosis of Symptomatic fibroids, and abnormal uterine bleeding  The various methods of treatment have been discussed with the patient and family. After consideration of risks, benefits and other options for treatment, the patient has consented to TOTAL VAGINA HYSTERECTOMY as a surgical intervention .  The patient's history has been reviewed, patient examined, no change in status, stable for surgery.  I have reviewed the patient's chart and labs.  Questions were answered to the patient's satisfaction.  To OR when ready.  Tereso Newcomer, MD 01/06/2013 7:15 AM

## 2013-01-06 NOTE — Anesthesia Postprocedure Evaluation (Signed)
  Anesthesia Post-op Note  Patient: Marisa [name]  Procedure(s) Performed: Procedure(s): HYSTERECTOMY VAGINAL (N/A) Patient is awake and responsive. Pain and nausea are reasonably well controlled. Vital signs are stable and clinically acceptable. Oxygen saturation is clinically acceptable. There are no apparent anesthetic complications at this time. Patient is ready for discharge.

## 2013-01-07 ENCOUNTER — Inpatient Hospital Stay (HOSPITAL_COMMUNITY)
Admission: AD | Admit: 2013-01-07 | Discharge: 2013-01-07 | Disposition: A | Discharge status: Home / Self Care | Payer: No Typology Code available for payment source | Source: Ambulatory Visit | Attending: Obstetrics & Gynecology | Admitting: Obstetrics & Gynecology

## 2013-01-07 ENCOUNTER — Encounter (HOSPITAL_COMMUNITY): Payer: Self-pay | Admitting: Obstetrics & Gynecology

## 2013-01-07 DIAGNOSIS — Y836 Removal of other organ (partial) (total) as the cause of abnormal reaction of the patient, or of later complication, without mention of misadventure at the time of the procedure: Secondary | ICD-10-CM | POA: Insufficient documentation

## 2013-01-07 DIAGNOSIS — T85898A Other specified complication of other internal prosthetic devices, implants and grafts, initial encounter: Secondary | ICD-10-CM | POA: Insufficient documentation

## 2013-01-07 DIAGNOSIS — T82838A Hemorrhage of vascular prosthetic devices, implants and grafts, initial encounter: Secondary | ICD-10-CM

## 2013-01-07 LAB — CBC
HCT: 23.5 % — ABNORMAL LOW (ref 36.0–46.0)
Hemoglobin: 7.5 g/dL — ABNORMAL LOW (ref 12.0–15.0)
MCH: 26.3 pg (ref 26.0–34.0)
MCHC: 31.9 g/dL (ref 30.0–36.0)
MCV: 82.5 fL (ref 78.0–100.0)
Platelets: 367 10*3/uL (ref 150–400)
RBC: 2.85 MIL/uL — ABNORMAL LOW (ref 3.87–5.11)
RDW: 15.7 % — ABNORMAL HIGH (ref 11.5–15.5)
WBC: 9.2 10*3/uL (ref 4.0–10.5)

## 2013-01-07 MED ORDER — OXYCODONE-ACETAMINOPHEN 5-325 MG PO TABS: 1.0000 | ORAL_TABLET | Freq: Four times a day (QID) | ORAL | Status: DC | PRN

## 2013-01-07 MED ORDER — DSS 100 MG PO CAPS: 100.0000 mg | ORAL_CAPSULE | Freq: Two times a day (BID) | ORAL | Status: DC | PRN

## 2013-01-07 MED ORDER — IBUPROFEN 600 MG PO TABS: 600.0000 mg | ORAL_TABLET | Freq: Four times a day (QID) | ORAL | Status: DC | PRN

## 2013-01-07 NOTE — MAU Provider Note (Signed)
S: 37 y.o.  Pt s/p total vag/hyst on 01/06/13 was D/C'd from hospital today and had PICC line removed immediately prior to D/C.  Pt presented to MAU with bleeding from PICC site dripping down her arm.  She reported dizziness at the time of arrival in MAU and reports known anemia.  She denies abdominal pain, vaginal bleeding, vaginal itching/burning, urinary symptoms, h/a, n/v, or fever/chills.   O: Vital signs done at time of MAU visit, not entered into Epic.  RN to enter vitals into system.  Blood pressures observed by CNM: BP 115/70-125/80 while pt in MAU.  HR >100 upon arrival in MAU but HR in 80s when pt discharged. Pt afebrile.   A:  1. Bleeding from PICC line, initial encounter    P: Medical screen performed PICC line RN to bedside Pressure dressing applied Pt in MAU x30 minutes Bleeding stopped, pt stable D/C home F/U as scheduled  Sharen Counter Certified Nurse-Midwife

## 2013-01-07 NOTE — Progress Notes (Signed)
Patient was escorted to car by RN and Husband. Pt placed in front seat.  10 mins later pt arrived at MAU bleeding from previous picc line access site.  RN called from 3rd floor to assess.   Pt laying on stretcher, bleeding was stopped by manual pressure.  Placed new pressure dressing on patient and informed pt to lay flat for 20 more mins with arm above head. Pt will be monitored on mau for further bleeding for 20 mins.

## 2013-01-07 NOTE — Progress Notes (Signed)
Nurse walked into room at 4:40 this morning to get patient up to walk. Nurse noticed blood on the floor near the IV pole. The patient's PICC line disconnected from the tubing. The nurses clamped the PICC line, flushed it with 10 cc's and change the tubing. Patient's vital signs were taken after the incident and were normal.

## 2013-01-07 NOTE — Discharge Summary (Signed)
Gynecology Physician Discharge Summary  Patient ID: Marisa [name] MRN: [id number] DOB/AGE: [birth year] 37 y.o.  Admit date: 01/06/2013 Discharge date: 01/07/2013  Preoperative Diagnoses: Symptomatic fibroids, abnormal uterine bleeding  Procedures: TOTAL VAGINAL HYSTERECTOMY   Results for orders placed during the hospital encounter of 01/06/13 (from the past 336 hour(s))  PREGNANCY, URINE   Collection Time    01/06/13  5:50 AM      Result Value Range   Preg Test, Ur NEGATIVE  NEGATIVE  TYPE AND SCREEN   Collection Time    01/06/13  6:25 AM      Result Value Range   ABO/RH(D) O POS     Antibody Screen NEG     Sample Expiration 01/09/2013    CBC   Collection Time    01/07/13  5:45 AM      Result Value Range   WBC 9.2  4.0 - 10.5 K/uL   RBC 2.85 (*) 3.87 - 5.11 MIL/uL   Hemoglobin 7.5 (*) 12.0 - 15.0 g/dL   HCT 40.9 (*) 81.1 - 91.4 %   MCV 82.5  78.0 - 100.0 fL   MCH 26.3  26.0 - 34.0 pg   MCHC 31.9  30.0 - 36.0 g/dL   RDW 78.2 (*) 95.6 - 21.3 %   Platelets 367  150 - 400 K/uL  Results for orders placed during the hospital encounter of 12/29/12 (from the past 336 hour(s))  CBC   Collection Time    12/29/12  2:00 PM      Result Value Range   WBC 8.0  4.0 - 10.5 K/uL   RBC 3.92  3.87 - 5.11 MIL/uL   Hemoglobin 10.4 (*) 12.0 - 15.0 g/dL   HCT 08.6 (*) 57.8 - 46.9 %   MCV 83.4  78.0 - 100.0 fL   MCH 26.5  26.0 - 34.0 pg   MCHC 31.8  30.0 - 36.0 g/dL   RDW 62.9 (*) 52.8 - 41.3 %   Platelets 416 (*) 150 - 400 K/uL     Hospital Course:  Marisa [name] is a 37 y.o. [id number]  admitted for scheduled surgery.  She underwent the procedure as mentioned above, her operation was uncomplicated. For further details about surgery, please refer to the operative report. Patient had an uncomplicated postoperative course.  Of note, patient was advised to take iron supplements for her asymptomatic anemia.  Furthermore, patient had significant inadvertent blood loss via her PICC line  (see RN note for more details), and this contributed to her anemia as she did not have a significant EBL during surgery.   By time of discharge on POD#1, her pain was controlled on oral pain medications; she was ambulating, voiding without difficulty, tolerating regular diet and passing flatus. She was deemed stable for discharge to home.   Discharge Exam: Blood pressure 119/78, pulse 100, temperature 98.2 F (36.8 C), temperature source Oral, resp. rate 18, height 5\' 5"  (1.651 m), weight 220 lb (99.791 kg), SpO2 100.00%. General appearance: alert and no distress Resp: clear to auscultation bilaterally Cardio: RRR GI: soft, non-tender; bowel sounds normal; no masses,  no organomegaly Pelvic: scant blood on pad Extremities: extremities normal, atraumatic, no cyanosis or edema and Homans sign is negative, no sign of DVT  Discharged Condition: Stable  Disposition: 01-Home or Self Care   Future Appointments Provider Department Dept Phone   02/07/2013 11:00 AM Tereso Newcomer, MD [facility] [phone]       Medication List  STOP taking these medications       diclofenac 75 MG EC tablet  Commonly known as:  VOLTAREN     megestrol 20 MG tablet  Commonly known as:  MEGACE     traMADol 50 MG tablet  Commonly known as:  ULTRAM      TAKE these medications       cetirizine 10 MG tablet  Commonly known as:  ZYRTEC  Take 10 mg by mouth daily as needed for allergies.     DSS 100 MG Caps  Take 100 mg by mouth 2 (two) times daily as needed for mild constipation.     ibuprofen 600 MG tablet  Commonly known as:  ADVIL,MOTRIN  Take 1 tablet (600 mg total) by mouth every 6 (six) hours as needed (mild pain).     LORazepam 1 MG tablet  Commonly known as:  ATIVAN  Take 1 tablet (1 mg total) by mouth 3 (three) times daily as needed for anxiety.     ondansetron 4 MG disintegrating tablet  Commonly known as:  ZOFRAN ODT  Take 1 tablet (4 mg total) by mouth every 8 (eight)  hours as needed for nausea.     oxyCODONE-acetaminophen 5-325 MG per tablet  Commonly known as:  PERCOCET/ROXICET  Take 1-2 tablets by mouth every 6 (six) hours as needed.          Follow-up Information   Follow up with Tereso Newcomer, MD On 02/07/2013. (11am for postoperative appointment.  Call clinic or go to MAU for any postoperative concerns.)    Specialty:  Obstetrics and Gynecology   Contact information:   [address] [place] [address] [phone]       Signed:  Jaynie Collins, MD, FACOG Attending Obstetrician & Gynecologist Faculty Practice, [facility]

## 2013-01-10 NOTE — MAU Provider Note (Signed)
Attestation of Attending Supervision of Advanced Practitioner (CNM/NP): Evaluation and management procedures were performed by the Advanced Practitioner under my supervision and collaboration.  I have reviewed the Advanced Practitioner's note and chart, and I agree with the management and plan.  HARRAWAY-SMITH, Heavenly Christine 3:53 PM     

## 2013-01-16 ENCOUNTER — Emergency Department (HOSPITAL_COMMUNITY)
Admission: EM | Admit: 2013-01-16 | Discharge: 2013-01-17 | Disposition: A | Discharge status: Home / Self Care | Payer: No Typology Code available for payment source | Attending: Emergency Medicine | Admitting: Emergency Medicine

## 2013-01-16 ENCOUNTER — Encounter (HOSPITAL_COMMUNITY): Payer: Self-pay | Admitting: Emergency Medicine

## 2013-01-16 DIAGNOSIS — R109 Unspecified abdominal pain: Secondary | ICD-10-CM | POA: Insufficient documentation

## 2013-01-16 DIAGNOSIS — R110 Nausea: Secondary | ICD-10-CM | POA: Insufficient documentation

## 2013-01-16 DIAGNOSIS — J45909 Unspecified asthma, uncomplicated: Secondary | ICD-10-CM | POA: Insufficient documentation

## 2013-01-16 DIAGNOSIS — R300 Dysuria: Secondary | ICD-10-CM | POA: Insufficient documentation

## 2013-01-16 DIAGNOSIS — Z8679 Personal history of other diseases of the circulatory system: Secondary | ICD-10-CM | POA: Insufficient documentation

## 2013-01-16 DIAGNOSIS — B379 Candidiasis, unspecified: Secondary | ICD-10-CM | POA: Insufficient documentation

## 2013-01-16 DIAGNOSIS — Z862 Personal history of diseases of the blood and blood-forming organs and certain disorders involving the immune mechanism: Secondary | ICD-10-CM | POA: Insufficient documentation

## 2013-01-16 DIAGNOSIS — Z79899 Other long term (current) drug therapy: Secondary | ICD-10-CM | POA: Insufficient documentation

## 2013-01-16 DIAGNOSIS — Z8639 Personal history of other endocrine, nutritional and metabolic disease: Secondary | ICD-10-CM | POA: Insufficient documentation

## 2013-01-16 DIAGNOSIS — Z8742 Personal history of other diseases of the female genital tract: Secondary | ICD-10-CM | POA: Insufficient documentation

## 2013-01-16 DIAGNOSIS — R509 Fever, unspecified: Secondary | ICD-10-CM | POA: Insufficient documentation

## 2013-01-16 DIAGNOSIS — R5381 Other malaise: Secondary | ICD-10-CM | POA: Insufficient documentation

## 2013-01-16 DIAGNOSIS — Z8674 Personal history of sudden cardiac arrest: Secondary | ICD-10-CM | POA: Insufficient documentation

## 2013-01-16 DIAGNOSIS — Z880 Allergy status to penicillin: Secondary | ICD-10-CM | POA: Insufficient documentation

## 2013-01-16 DIAGNOSIS — F17200 Nicotine dependence, unspecified, uncomplicated: Secondary | ICD-10-CM | POA: Insufficient documentation

## 2013-01-16 LAB — CBC WITH DIFFERENTIAL/PLATELET
Basophils Absolute: 0 10*3/uL (ref 0.0–0.1)
Basophils Relative: 0 % (ref 0–1)
Eosinophils Absolute: 0.2 10*3/uL (ref 0.0–0.7)
Eosinophils Relative: 2 % (ref 0–5)
HCT: 32.5 % — ABNORMAL LOW (ref 36.0–46.0)
Hemoglobin: 10.5 g/dL — ABNORMAL LOW (ref 12.0–15.0)
Lymphocytes Relative: 28 % (ref 12–46)
Lymphs Abs: 2.8 10*3/uL (ref 0.7–4.0)
MCH: 26.9 pg (ref 26.0–34.0)
MCHC: 32.3 g/dL (ref 30.0–36.0)
MCV: 83.1 fL (ref 78.0–100.0)
Monocytes Absolute: 0.8 10*3/uL (ref 0.1–1.0)
Monocytes Relative: 8 % (ref 3–12)
Neutro Abs: 6.1 10*3/uL (ref 1.7–7.7)
Neutrophils Relative %: 62 % (ref 43–77)
Platelets: 666 10*3/uL — ABNORMAL HIGH (ref 150–400)
RBC: 3.91 MIL/uL (ref 3.87–5.11)
RDW: 15.6 % — ABNORMAL HIGH (ref 11.5–15.5)
WBC: 10 10*3/uL (ref 4.0–10.5)

## 2013-01-16 LAB — POCT I-STAT, CHEM 8
BUN: 8 mg/dL (ref 6–23)
Calcium, Ion: 1.27 mmol/L — ABNORMAL HIGH (ref 1.12–1.23)
Chloride: 107 mEq/L (ref 96–112)
Creatinine, Ser: 0.8 mg/dL (ref 0.50–1.10)
Glucose, Bld: 152 mg/dL — ABNORMAL HIGH (ref 70–99)
HCT: 34 % — ABNORMAL LOW (ref 36.0–46.0)
Hemoglobin: 11.6 g/dL — ABNORMAL LOW (ref 12.0–15.0)
Potassium: 3.8 mEq/L (ref 3.5–5.1)
Sodium: 142 mEq/L (ref 135–145)
TCO2: 22 mmol/L (ref 0–100)

## 2013-01-16 MED ORDER — MORPHINE SULFATE 4 MG/ML IJ SOLN
4.0000 mg | Freq: Once | INTRAMUSCULAR | Status: AC
  Administered 2013-01-16: 4 mg via INTRAVENOUS
  Filled 2013-01-16: qty 1

## 2013-01-16 MED ORDER — SODIUM CHLORIDE 0.9 % IV BOLUS (SEPSIS)
500.0000 mL | Freq: Once | INTRAVENOUS | Status: AC
  Administered 2013-01-16: 500 mL via INTRAVENOUS

## 2013-01-16 MED ORDER — ONDANSETRON HCL 4 MG/2ML IJ SOLN
4.0000 mg | Freq: Once | INTRAMUSCULAR | Status: AC
  Administered 2013-01-16: 4 mg via INTRAVENOUS
  Filled 2013-01-16: qty 2

## 2013-01-16 MED ORDER — IOHEXOL 300 MG/ML  SOLN: 50.0000 mL | Freq: Once | INTRAMUSCULAR | Status: AC | PRN

## 2013-01-16 NOTE — ED Notes (Addendum)
Hysterectomy at [facility] on 11/20. Thursday developed rash ("vaginal pelvic area with blisters & odor"), general weakness, fever (101), loss of appetite. Also mentions LLQ and abd/pelvic pain & dysuria. No tylenol or advil in last 6 hrs.

## 2013-01-16 NOTE — ED Notes (Addendum)
Pt states she had hysterectomy on 11/20; started experiencing pain and nausea on 11/27; pt states she has rash in [place] area. Pt c/o pain level 10 across abdomen into bilateral flank areas and down to bladder. Denies spotting or bleeding since Hysterectomy. Pt c/o pain on urination and urinary retention.

## 2013-01-16 NOTE — ED Provider Notes (Signed)
CSN: [id number]     Arrival date & time 01/16/13  1947 History   First MD Initiated Contact with Patient 01/16/13 2223     Chief Complaint  Patient presents with  . Rash  . Nausea  . Fatigue  . Fever   (Consider location/radiation/quality/duration/timing/severity/associated sxs/prior Treatment) Patient is a 37 y.o. female presenting with rash and fever. The history is provided by the patient.  Rash Associated symptoms: abdominal pain and fever   Associated symptoms: no shortness of breath   Fever Associated symptoms: dysuria and rash   patient had a total vaginal hysterectomy at [facility] hospital on the 21st, 9 days ago. She states she was doing well until the 27th when she began to have more lower abdominal pain. She states she also has had a rash develop in her pubic area. She states it has a rash in her to urinate. She does not have vaginal discharge. She states she has some swelling on her labia. She states she's having difficulty urinating also. She states she's had fevers up to 101.  Past Medical History  Diagnosis Date  . Asthma   . EP (ectopic pregnancy)   . Ovarian tumor   . Tumor, thyroid   . Sleep apnea   . Chronic headaches   . Irregular heartbeat   . Chronic female pelvic pain 10/11/2012  . Abnormal uterine bleeding (AUB) 10/11/2012  . Cardiac arrest 1992    during delivery   Past Surgical History  Procedure Laterality Date  . Cholecystectomy  2005  . Unilateral salpingectomy  2009    Abdominal left salpingectomy  . Vaginal hysterectomy N/A 01/06/2013    Procedure: HYSTERECTOMY VAGINAL;  Surgeon: Tereso Newcomer, MD;  Location: [facility];  Service: Gynecology;  Laterality: N/A;   Family History  Problem Relation Age of Onset  . Hypertension Mother   . Diabetes Mother   . Cancer Father   . Hyperlipidemia Father   . Hypertension Father   . Allergies Mother   . Heart disease Mother   . Heart disease Maternal Grandmother    History  Substance Use Topics  .  Smoking status: Current Every Day Smoker -- 0.25 packs/day for 26 years    Types: Cigarettes    Last Attempt to Quit: 09/12/2012  . Smokeless tobacco: Never Used     Comment: 1 pack per week  . Alcohol Use: 0.6 oz/week    1 Cans of beer per week     Comment: 1 bottle of wine per week   OB History   Grav Para Term Preterm Abortions TAB SAB Ect Mult Living   3 1 1  2 1  1  1      Review of Systems  Constitutional: Positive for fever.  Respiratory: Negative for chest tightness and shortness of breath.   Gastrointestinal: Positive for abdominal pain.  Genitourinary: Positive for dysuria. Negative for vaginal bleeding.  Musculoskeletal: Negative for back pain.  Skin: Positive for rash.  Neurological: Negative for dizziness.    Allergies  Penicillins; Shellfish allergy; and Other  Home Medications   Current Outpatient Rx  Name  Route  Sig  Dispense  Refill  . albuterol (PROVENTIL HFA;VENTOLIN HFA) 108 (90 BASE) MCG/ACT inhaler   Inhalation   Inhale 1-2 puffs into the lungs every 6 (six) hours as needed for wheezing or shortness of breath.         . oxyCODONE-acetaminophen (PERCOCET/ROXICET) 5-325 MG per tablet   Oral   Take 1-2 tablets by mouth  every 6 (six) hours as needed.   50 tablet   0     For breakthrough pain.    BP 126/78  Pulse 89  Temp(Src) 98.5 F (36.9 C) (Oral)  Resp 22  Ht 5\' 5"  (1.651 m)  Wt 210 lb (95.255 kg)  BMI 34.95 kg/m2  SpO2 100%  LMP 11/27/2012 Physical Exam  Nursing note and vitals reviewed. Constitutional: She is oriented to person, place, and time. She appears well-developed and well-nourished.  HENT:  Head: Normocephalic and atraumatic.  Eyes: EOM are normal. Pupils are equal, round, and reactive to light.  Neck: Normal range of motion. Neck supple.  Cardiovascular: Normal rate, regular rhythm and normal heart sounds.   No murmur heard. Pulmonary/Chest: Effort normal and breath sounds normal. No respiratory distress. She has no  wheezes. She has no rales.  Abdominal: Soft. Bowel sounds are normal. She exhibits no distension. There is tenderness. There is no rebound and no guarding.  Suprapubic lower abdominal tenderness.  Musculoskeletal: Normal range of motion.  Neurological: She is alert and oriented to person, place, and time. No cranial nerve deficit.  Skin: Skin is warm and dry. Rash noted.  White painful rash to full to skin and bilateral inguinal areas and suprapubic fold. Tenderness to bilateral labia without clear rash. No vaginal discharge.  Psychiatric: She has a normal mood and affect. Her speech is normal.    ED Course  Procedures (including critical care time) Labs Review Labs Reviewed  CBC WITH DIFFERENTIAL - Abnormal; Notable for the following:    Hemoglobin 10.5 (*)    HCT 32.5 (*)    RDW 15.6 (*)    Platelets 666 (*)    All other components within normal limits  POCT I-STAT, CHEM 8 - Abnormal; Notable for the following:    Glucose, Bld 152 (*)    Calcium, Ion 1.27 (*)    Hemoglobin 11.6 (*)    HCT 34.0 (*)    All other components within normal limits  URINALYSIS, ROUTINE W REFLEX MICROSCOPIC   Imaging Review No results found.  EKG Interpretation   None       MDM  No diagnosis found. Patient with lower abdominal pain and rash postoperatively. Has possible yeast infection. CT scan pending.    Juliet Rude. Rubin Payor, MD 01/17/13 [phone]

## 2013-01-17 ENCOUNTER — Encounter (HOSPITAL_COMMUNITY): Payer: Self-pay | Admitting: Radiology

## 2013-01-17 ENCOUNTER — Telehealth: Payer: Self-pay | Admitting: Obstetrics & Gynecology

## 2013-01-17 ENCOUNTER — Emergency Department (HOSPITAL_COMMUNITY): Payer: No Typology Code available for payment source

## 2013-01-17 DIAGNOSIS — N762 Acute vulvitis: Secondary | ICD-10-CM

## 2013-01-17 DIAGNOSIS — Z90710 Acquired absence of both cervix and uterus: Secondary | ICD-10-CM

## 2013-01-17 DIAGNOSIS — B379 Candidiasis, unspecified: Secondary | ICD-10-CM

## 2013-01-17 LAB — URINALYSIS, ROUTINE W REFLEX MICROSCOPIC
Bilirubin Urine: NEGATIVE
Glucose, UA: NEGATIVE mg/dL
Ketones, ur: NEGATIVE mg/dL
Nitrite: NEGATIVE
Protein, ur: NEGATIVE mg/dL
Specific Gravity, Urine: 1.031 — ABNORMAL HIGH (ref 1.005–1.030)
Urobilinogen, UA: 0.2 mg/dL (ref 0.0–1.0)
pH: 5 (ref 5.0–8.0)

## 2013-01-17 LAB — URINE MICROSCOPIC-ADD ON

## 2013-01-17 MED ORDER — OXYCODONE-ACETAMINOPHEN 5-325 MG PO TABS
2.0000 | ORAL_TABLET | Freq: Once | ORAL | Status: AC
  Administered 2013-01-17: 2 via ORAL
  Filled 2013-01-17: qty 2

## 2013-01-17 MED ORDER — SULFAMETHOXAZOLE-TRIMETHOPRIM 800-160 MG PO TABS: 1.0000 | ORAL_TABLET | Freq: Two times a day (BID) | ORAL | Status: DC

## 2013-01-17 MED ORDER — IOHEXOL 300 MG/ML  SOLN
100.0000 mL | Freq: Once | INTRAMUSCULAR | Status: AC | PRN
  Administered 2013-01-17: 100 mL via INTRAVENOUS

## 2013-01-17 MED ORDER — CLINDAMYCIN HCL 300 MG PO CAPS: 300.0000 mg | ORAL_CAPSULE | Freq: Four times a day (QID) | ORAL | Status: DC

## 2013-01-17 MED ORDER — OXYCODONE-ACETAMINOPHEN 5-325 MG PO TABS: 1.0000 | ORAL_TABLET | ORAL | Status: DC | PRN

## 2013-01-17 MED ORDER — MORPHINE SULFATE 4 MG/ML IJ SOLN
4.0000 mg | Freq: Once | INTRAMUSCULAR | Status: AC
  Administered 2013-01-17: 4 mg via INTRAVENOUS
  Filled 2013-01-17: qty 1

## 2013-01-17 MED ORDER — FLUCONAZOLE 150 MG PO TABS: 150.0000 mg | ORAL_TABLET | Freq: Once | ORAL | Status: DC

## 2013-01-17 MED ORDER — NYSTATIN 100000 UNIT/GM EX CREA: TOPICAL_CREAM | CUTANEOUS | Status: DC

## 2013-01-17 NOTE — ED Provider Notes (Signed)
3:15 AM Patient feels much better this time.  Vital signs are normal.  Her blood cell count is normal.  This may represent yeast infection versus developing vulvar cellulitis.  Patient will be placed on clindamycin as well as nystatin cream.  Questionable small area involving the vaginal cuff which could represent early abscess versus postoperative fluid collection.  I've asked that the patient call her OB/GYN in the morning for close followup within the next 24 hours for recheck.  If she is unable to see her OB/GYN next 24 hours she will need to followup at [facility] maternity assessment unit in the next 24 hours for recheck.  She understands the importance of close PCP and 24-hour recheck.  Lyanne Co, MD 01/17/13 [phone]

## 2013-01-17 NOTE — Telephone Encounter (Signed)
Faculty Practice OB/GYN Attending Phone Call Documentation  I placed a call to Marisa [name]  after reviewing her recent ED visit notes.  Patient reiterated that her discomfort and pain corresponded to the area of her erythematous rash.  She has not picked up any prescription; wants the medications to be e-prescribed to [facility] on [address].  She also says she cannot afford the Clindamycin prescribed for presumed 1 cm vaginal cuff abscess seen on CT scan.  Patient has no vaginal symptoms or pain, her WBC was normal, and she was afebrile in the ED.  She was told that the 1 cm finding on CT scan was most likely not an abscess, this kind of finding can be seen on routine post-hysterectomy scans.  Patient was reassured.  I e-prescribed Nystatin cream, Diflucan and Bactrim DS for patient.  She was told to come to MAU with any worsening symptoms. Patient does not need any other clinic or MAU followup unless she has worsening symptoms; she already has a postoperative clinic appointment scheduled on 01/24/13 at 1:15 pm and patient is aware of this appointment.      Jaynie Collins, MD, FACOG Attending Obstetrician & Gynecologist Faculty Practice, [facility] [place]

## 2013-01-17 NOTE — ED Notes (Signed)
Md at bedside

## 2013-01-17 NOTE — Progress Notes (Signed)
Incoming call from Patient who reports she was seen in ED 11.30.14.She reports she cant afford the prescribed Clindamycin medication and would like a cheaper medication To be called in.Patient reports she usually uses the four dollar plan.Medication request sheet completed and placed in folder in fast track.

## 2013-01-24 ENCOUNTER — Encounter: Payer: Self-pay | Admitting: Obstetrics & Gynecology

## 2013-01-24 ENCOUNTER — Ambulatory Visit (INDEPENDENT_AMBULATORY_CARE_PROVIDER_SITE_OTHER): Payer: No Typology Code available for payment source | Admitting: Obstetrics & Gynecology

## 2013-01-24 VITALS — BP 148/97 | HR 115 | Temp 97.2°F | Ht 65.0 in | Wt 224.7 lb

## 2013-01-24 DIAGNOSIS — Z90710 Acquired absence of both cervix and uterus: Secondary | ICD-10-CM

## 2013-01-24 DIAGNOSIS — B379 Candidiasis, unspecified: Secondary | ICD-10-CM

## 2013-01-24 DIAGNOSIS — Z09 Encounter for follow-up examination after completed treatment for conditions other than malignant neoplasm: Secondary | ICD-10-CM

## 2013-01-24 MED ORDER — NYSTATIN 100000 UNIT/GM EX OINT: TOPICAL_OINTMENT | CUTANEOUS | Status: DC

## 2013-01-24 MED ORDER — FLUCONAZOLE 150 MG PO TABS: 150.0000 mg | ORAL_TABLET | ORAL | Status: DC

## 2013-01-24 NOTE — Progress Notes (Signed)
GYNECOLOGY CLINIC ENCOUNTER NOTE  History:  37 y.o. [id number] here today for evaluation for erythematous abdominal rash, postoperative vaginal pain and irritation s/p TVH on 01/06/13.  She was seen in the [facility] on 01/17/13 and was prescribed antibiotics for presumed 1 cm vaginal cuff abscess seen on CT scan and antifungals for possible yeast infection.  Patient reports that she has been taking the antibiotics, denies any vaginal pain or fevers, just intense itching and irritation around her abdomen and vagina.  The following portions of the patient's history were reviewed and updated as appropriate: allergies, current medications, past family history, past medical history, past social history, past surgical history and problem list.  Review of Systems:  Pertinent items are noted in HPI.  Objective:  Physical Exam BP 148/97  Pulse 115  Temp(Src) 97.2 F (36.2 C) (Oral)  Ht 5\' 5"  (1.651 m)  Wt 224 lb 11.2 oz (101.923 kg)  BMI 37.39 kg/m2  LMP 11/27/2012 Gen: NAD Abd: Soft, +erythematious and tender rash under her pannus and in bilateral abdominal/inner thigh junctions. Pelvic: External genitalia with marked erytherma.  White, thick discharge noted in vagina with associated erythema.  Wet prep obtained.  Vaginal cuff intact, stitches visible.   No cuff erythema or tenderness.  Assessment & Plan:  Patient has yeast infection of the skin and vulvovaginal area -Nystatin ointment and extended Diflucan course prescribed.   Patient told to stop antibiotics for now as this may be exacerbating her yeast infection. -Follow up wet prep and manage accordingly Follow up for scheduled postoperative appointment on 02/07/13 or earlier if indicated.   Jaynie Collins, MD, FACOG Attending Obstetrician & Gynecologist Faculty Practice, [facility] [place]

## 2013-01-24 NOTE — Patient Instructions (Signed)
Return to clinic for any scheduled appointments or for any gynecologic concerns as needed.  Yeast Infection of the Skin Some yeast on the skin is normal, but sometimes it causes an infection. If you have a yeast infection, it shows up as red or pick patches on brown skin.  It can happen on any area of the body. This cannot be passed from person to person. HOME CARE  Scrub your skin daily with a dandruff shampoo. Your rash may take a couple weeks to get well.  Take any medications as prescribed by your doctor.  Do not scratch or itch the rash. GET HELP RIGHT AWAY IF:   You get another infection from scratching. The skin may get warm, red, and may ooze fluid.  The infection does not seem to be getting better. MAKE SURE YOU:  Understand these instructions.  Will watch your condition.  Will get help right away if you are not doing well or get worse. Document Released: 01/17/2008 Document Revised: 04/28/2011 Document Reviewed: 01/17/2008 [facility] Patient Information 2014 [place], [place].  Monilial Vaginitis Vaginitis in a soreness, swelling and redness (inflammation) of the vagina and vulva. Monilial vaginitis is not a sexually transmitted infection. CAUSES  Yeast vaginitis is caused by yeast (candida) that is normally found in your vagina. With a yeast infection, the candida has overgrown in number to a point that upsets the chemical balance. SYMPTOMS   White, thick vaginal discharge.  Swelling, itching, redness and irritation of the vagina and possibly the lips of the vagina (vulva).  Burning or painful urination.  Painful intercourse. DIAGNOSIS  Things that may contribute to monilial vaginitis are:  Postmenopausal and virginal states.  Pregnancy.  Infections.  Being tired, sick or stressed, especially if you had monilial vaginitis in the past.  Diabetes. Good control will help lower the chance.  Birth control pills.  Tight fitting garments.  Using bubble bath,  feminine sprays, douches or deodorant tampons.  Taking certain medications that kill germs (antibiotics).  Sporadic recurrence can occur if you become ill. TREATMENT  Your caregiver will give you medication.  There are several kinds of anti monilial vaginal creams and suppositories specific for monilial vaginitis. For recurrent yeast infections, use a suppository or cream in the vagina 2 times a week, or as directed.  Anti-monilial or steroid cream for the itching or irritation of the vulva may also be used. Get your caregiver's permission.  Painting the vagina with methylene blue solution may help if the monilial cream does not work.  Eating yogurt may help prevent monilial vaginitis. HOME CARE INSTRUCTIONS   Finish all medication as prescribed.  Do not have sex until treatment is completed or after your caregiver tells you it is okay.  Take warm sitz baths.  Do not douche.  Do not use tampons, especially scented ones.  Wear cotton underwear.  Avoid tight pants and panty hose.  Tell your sexual partner that you have a yeast infection. They should go to their caregiver if they have symptoms such as mild rash or itching.  Your sexual partner should be treated as well if your infection is difficult to eliminate.  Practice safer sex. Use condoms.  Some vaginal medications cause latex condoms to fail. Vaginal medications that harm condoms are:  Cleocin cream.  Butoconazole (Femstat).  Terconazole (Terazol) vaginal suppository.  Miconazole (Monistat) (may be purchased over the counter). SEEK MEDICAL CARE IF:   You have a temperature by mouth above 102 F (38.9 C).  The infection is  getting worse after 2 days of treatment.  The infection is not getting better after 3 days of treatment.  You develop blisters in or around your vagina.  You develop vaginal bleeding, and it is not your menstrual period.  You have pain when you urinate.  You develop intestinal  problems.  You have pain with sexual intercourse. Document Released: 11/13/2004 Document Revised: 04/28/2011 Document Reviewed: 07/28/2008 [facility] Patient Information 2014 [place], [place].

## 2013-01-25 LAB — WET PREP, GENITAL: Trich, Wet Prep: NONE SEEN

## 2013-02-07 ENCOUNTER — Encounter: Payer: Self-pay | Admitting: Obstetrics & Gynecology

## 2013-02-07 ENCOUNTER — Ambulatory Visit (INDEPENDENT_AMBULATORY_CARE_PROVIDER_SITE_OTHER): Payer: No Typology Code available for payment source | Admitting: Obstetrics & Gynecology

## 2013-02-07 VITALS — BP 136/98 | HR 102 | Temp 97.2°F | Ht 65.0 in | Wt 230.7 lb

## 2013-02-07 DIAGNOSIS — G8918 Other acute postprocedural pain: Secondary | ICD-10-CM

## 2013-02-07 DIAGNOSIS — Z90710 Acquired absence of both cervix and uterus: Secondary | ICD-10-CM

## 2013-02-07 DIAGNOSIS — Z09 Encounter for follow-up examination after completed treatment for conditions other than malignant neoplasm: Secondary | ICD-10-CM

## 2013-02-07 DIAGNOSIS — Z23 Encounter for immunization: Secondary | ICD-10-CM

## 2013-02-07 MED ORDER — TRAMADOL HCL 50 MG PO TABS: 50.0000 mg | ORAL_TABLET | Freq: Four times a day (QID) | ORAL | Status: DC | PRN

## 2013-02-07 NOTE — Progress Notes (Signed)
Patient reports continued post- op pain; states ibuprofen does not help at all, but the percocet was; states she feels like she is withdrawing from the percocet she was on; also states that she had something nearly fall on her and she had to push it off to prevent that from happening and has been hurting worse since then in her incision

## 2013-02-07 NOTE — Progress Notes (Signed)
   GYNECOLOGY CLINIC ENCOUNTER NOTE  History:   37 y.o. [id number] here today for evaluation for 4 week postoperative check s/p TVH on 01/06/13.  She was seen in clinic on 01/24/13 for yeast infection which was treated; she reports resolution of her symptoms.  She also reports having significant postoperative pelvic pain, not alleviated by Ibuprofen.  Also feels like she is withdrawing from Percocet, she has been on this for several weeks. She desires a weaker narcotic medication for her pain.  No other symptoms.  The following portions of the patient's history were reviewed and updated as appropriate: allergies, current medications, past family history, past medical history, past social history, past surgical history and problem list.   Review of Systems:   Pertinent items are noted in HPI.   Objective:   Physical Exam  Blood pressure 136/98, pulse 102, temperature 97.2 F (36.2 C), temperature source Oral, height 5\' 5"  (1.651 m), weight 230 lb 11.2 oz (104.645 kg), last menstrual period 11/27/2012. Gen: NAD  Abd: Soft, NT, ND  Pelvic:Normal external genitalia. Yellow discharge noted in vagina. Vaginal cuff intact, stitches visible. No cuff erythema or tenderness.  Assessment & Plan:    Normal postoperative check Tramadol prescribed for pain; she was told that this would be last narcotic prescription for postoperative pain.  Recommended Ibuprofen and Tylenol for pain. If further narcotics are needed after this, may consider pain clinic Work letter was given to patient to resume work on 02/21/13 as requested by patient. Flu shot given today. Return for routine care  Jaynie Collins, MD, FACOG Attending Obstetrician & Gynecologist Faculty Practice, [facility] [place]

## 2013-02-07 NOTE — Patient Instructions (Signed)
Return to clinic for any scheduled appointments or for any gynecologic concerns as needed.   

## 2013-04-18 ENCOUNTER — Emergency Department (HOSPITAL_COMMUNITY): Payer: No Typology Code available for payment source

## 2013-04-18 ENCOUNTER — Emergency Department (HOSPITAL_COMMUNITY)
Admission: EM | Admit: 2013-04-18 | Discharge: 2013-04-18 | Disposition: A | Discharge status: Home / Self Care | Payer: Self-pay | Attending: Emergency Medicine | Admitting: Emergency Medicine

## 2013-04-18 ENCOUNTER — Encounter (HOSPITAL_COMMUNITY): Payer: Self-pay | Admitting: Emergency Medicine

## 2013-04-18 ENCOUNTER — Emergency Department (HOSPITAL_COMMUNITY): Payer: Self-pay

## 2013-04-18 DIAGNOSIS — R519 Headache, unspecified: Secondary | ICD-10-CM

## 2013-04-18 DIAGNOSIS — J45901 Unspecified asthma with (acute) exacerbation: Secondary | ICD-10-CM | POA: Insufficient documentation

## 2013-04-18 DIAGNOSIS — Z90711 Acquired absence of uterus with remaining cervical stump: Secondary | ICD-10-CM | POA: Insufficient documentation

## 2013-04-18 DIAGNOSIS — F17200 Nicotine dependence, unspecified, uncomplicated: Secondary | ICD-10-CM | POA: Insufficient documentation

## 2013-04-18 DIAGNOSIS — Y9389 Activity, other specified: Secondary | ICD-10-CM | POA: Insufficient documentation

## 2013-04-18 DIAGNOSIS — R112 Nausea with vomiting, unspecified: Secondary | ICD-10-CM | POA: Insufficient documentation

## 2013-04-18 DIAGNOSIS — Z880 Allergy status to penicillin: Secondary | ICD-10-CM | POA: Insufficient documentation

## 2013-04-18 DIAGNOSIS — IMO0002 Reserved for concepts with insufficient information to code with codable children: Secondary | ICD-10-CM | POA: Insufficient documentation

## 2013-04-18 DIAGNOSIS — Z8742 Personal history of other diseases of the female genital tract: Secondary | ICD-10-CM | POA: Insufficient documentation

## 2013-04-18 DIAGNOSIS — S39011A Strain of muscle, fascia and tendon of abdomen, initial encounter: Secondary | ICD-10-CM

## 2013-04-18 DIAGNOSIS — R109 Unspecified abdominal pain: Secondary | ICD-10-CM

## 2013-04-18 DIAGNOSIS — Z79899 Other long term (current) drug therapy: Secondary | ICD-10-CM | POA: Insufficient documentation

## 2013-04-18 DIAGNOSIS — X500XXA Overexertion from strenuous movement or load, initial encounter: Secondary | ICD-10-CM | POA: Insufficient documentation

## 2013-04-18 DIAGNOSIS — Y929 Unspecified place or not applicable: Secondary | ICD-10-CM | POA: Insufficient documentation

## 2013-04-18 DIAGNOSIS — R51 Headache: Secondary | ICD-10-CM | POA: Insufficient documentation

## 2013-04-18 DIAGNOSIS — R197 Diarrhea, unspecified: Secondary | ICD-10-CM | POA: Insufficient documentation

## 2013-04-18 DIAGNOSIS — Z9089 Acquired absence of other organs: Secondary | ICD-10-CM | POA: Insufficient documentation

## 2013-04-18 DIAGNOSIS — G8929 Other chronic pain: Secondary | ICD-10-CM | POA: Insufficient documentation

## 2013-04-18 LAB — COMPREHENSIVE METABOLIC PANEL
ALT: 21 U/L (ref 0–35)
AST: 20 U/L (ref 0–37)
Albumin: 3.4 g/dL — ABNORMAL LOW (ref 3.5–5.2)
Alkaline Phosphatase: 82 U/L (ref 39–117)
BUN: 9 mg/dL (ref 6–23)
CO2: 22 mEq/L (ref 19–32)
Calcium: 8.9 mg/dL (ref 8.4–10.5)
Chloride: 101 mEq/L (ref 96–112)
Creatinine, Ser: 0.71 mg/dL (ref 0.50–1.10)
GFR calc Af Amer: >90 mL/min (ref >90)
GFR calc non Af Amer: >90 mL/min (ref >90)
Glucose, Bld: 97 mg/dL (ref 70–99)
Potassium: 4 mEq/L (ref 3.7–5.3)
Sodium: 137 mEq/L (ref 137–147)
Total Bilirubin: <0.2 mg/dL — ABNORMAL LOW (ref 0.3–1.2)
Total Protein: 7.2 g/dL (ref 6.0–8.3)

## 2013-04-18 LAB — URINALYSIS, ROUTINE W REFLEX MICROSCOPIC
Bilirubin Urine: NEGATIVE
Glucose, UA: NEGATIVE mg/dL
Hgb urine dipstick: NEGATIVE
Ketones, ur: NEGATIVE mg/dL
Leukocytes, UA: NEGATIVE
Nitrite: NEGATIVE
Protein, ur: NEGATIVE mg/dL
Specific Gravity, Urine: 1.024 (ref 1.005–1.030)
Urobilinogen, UA: 0.2 mg/dL (ref 0.0–1.0)
pH: 6 (ref 5.0–8.0)

## 2013-04-18 LAB — CBC WITH DIFFERENTIAL/PLATELET
Basophils Absolute: 0 10*3/uL (ref 0.0–0.1)
Basophils Relative: 0 % (ref 0–1)
Eosinophils Absolute: 0.1 10*3/uL (ref 0.0–0.7)
Eosinophils Relative: 1 % (ref 0–5)
HCT: 37.6 % (ref 36.0–46.0)
Hemoglobin: 12.3 g/dL (ref 12.0–15.0)
Lymphocytes Relative: 30 % (ref 12–46)
Lymphs Abs: 2.3 10*3/uL (ref 0.7–4.0)
MCH: 28.1 pg (ref 26.0–34.0)
MCHC: 32.7 g/dL (ref 30.0–36.0)
MCV: 85.8 fL (ref 78.0–100.0)
Monocytes Absolute: 0.6 10*3/uL (ref 0.1–1.0)
Monocytes Relative: 8 % (ref 3–12)
Neutro Abs: 4.7 10*3/uL (ref 1.7–7.7)
Neutrophils Relative %: 61 % (ref 43–77)
Platelets: 467 10*3/uL — ABNORMAL HIGH (ref 150–400)
RBC: 4.38 MIL/uL (ref 3.87–5.11)
RDW: 19.2 % — ABNORMAL HIGH (ref 11.5–15.5)
WBC: 7.8 10*3/uL (ref 4.0–10.5)

## 2013-04-18 LAB — I-STAT TROPONIN, ED: Troponin i, poc: 0 ng/mL (ref 0.00–0.08)

## 2013-04-18 LAB — LIPASE, BLOOD: Lipase: 27 U/L (ref 11–59)

## 2013-04-18 MED ORDER — IOHEXOL 300 MG/ML  SOLN
100.0000 mL | Freq: Once | INTRAMUSCULAR | Status: AC | PRN
  Administered 2013-04-18: 100 mL via INTRAVENOUS

## 2013-04-18 MED ORDER — HYDROCODONE-ACETAMINOPHEN 5-325 MG PO TABS: 1.0000 | ORAL_TABLET | Freq: Four times a day (QID) | ORAL | Status: DC | PRN

## 2013-04-18 MED ORDER — ONDANSETRON HCL 4 MG/2ML IJ SOLN
4.0000 mg | Freq: Once | INTRAMUSCULAR | Status: AC
  Administered 2013-04-18: 4 mg via INTRAVENOUS
  Filled 2013-04-18: qty 2

## 2013-04-18 MED ORDER — HYDROMORPHONE HCL PF 1 MG/ML IJ SOLN
1.0000 mg | Freq: Once | INTRAMUSCULAR | Status: AC
Start: 2013-04-18 — End: 2013-04-18
  Administered 2013-04-18: 1 mg via INTRAVENOUS
  Filled 2013-04-18: qty 1

## 2013-04-18 MED ORDER — IOHEXOL 300 MG/ML  SOLN
50.0000 mL | Freq: Once | INTRAMUSCULAR | Status: AC | PRN
  Administered 2013-04-18: 50 mL via ORAL

## 2013-04-18 MED ORDER — ONDANSETRON HCL 4 MG/2ML IJ SOLN: 4.0000 mg | Freq: Once | INTRAMUSCULAR | Status: DC

## 2013-04-18 MED ORDER — SODIUM CHLORIDE 0.9 % IV SOLN
Freq: Once | INTRAVENOUS | Status: AC
  Administered 2013-04-18: 09:00:00 via INTRAVENOUS

## 2013-04-18 MED ORDER — MORPHINE SULFATE 4 MG/ML IJ SOLN
4.0000 mg | Freq: Once | INTRAMUSCULAR | Status: AC
  Administered 2013-04-18: 4 mg via INTRAVENOUS
  Filled 2013-04-18: qty 1

## 2013-04-18 NOTE — ED Provider Notes (Signed)
CSN: [id number]     Arrival date & time 04/18/13  0815 History   First MD Initiated Contact with Patient 04/18/13 [phone]     Chief Complaint  Patient presents with  . Abdominal Pain  . Headache     (Consider location/radiation/quality/duration/timing/severity/associated sxs/prior Treatment) HPI Marisa [name] is a 38 y.o. female who presents to emergency department complaining of abdominal pain, nausea, vomiting, headache. Patient states that she started having headache 2 days ago. States yesterday she had nausea and that started in the morning. She states that she later lifted a heavy couch and felt sudden pain in her right abdomen. She states since then pain has worsened and she developed nausea and vomiting and diarrhea onset this morning. States she has had around 4 episodes of emesis, states that she had 2 episodes of watery diarrhea this morning. She reports pain is worsening. She has history of cholecystectomy and partial hysterectomy otherwise no prior surgeries. She denies taking any medicines daily. She does have history of chronic headaches, but she has not taken anything for them regularly. Patient also states she's having some shortness of breath. She denies any chest pain but states it hurts in her right upper abdomen when she takes a deep breath. She states she feels like she keeps having to take a deep breath to get enough air. She denies any swelling in her extremities or recent travel. She states that 2 of her family members recently had pneumonia. She denies any cough, fever or chills.  Past Medical History  Diagnosis Date  . Asthma   . EP (ectopic pregnancy)   . Ovarian tumor   . Tumor, thyroid   . Sleep apnea   . Chronic headaches   . Irregular heartbeat   . Chronic female pelvic pain 10/11/2012  . Abnormal uterine bleeding (AUB) 10/11/2012  . Cardiac arrest 1992    during delivery   Past Surgical History  Procedure Laterality Date  . Cholecystectomy  2005  .  Unilateral salpingectomy  2009    Abdominal left salpingectomy  . Vaginal hysterectomy N/A 01/06/2013    Procedure: HYSTERECTOMY VAGINAL;  Surgeon: Osborne Oman, MD;  Location: [facility];  Service: Gynecology;  Laterality: N/A;   Family History  Problem Relation Age of Onset  . Hypertension Mother   . Diabetes Mother   . Cancer Father   . Hyperlipidemia Father   . Hypertension Father   . Allergies Mother   . Heart disease Mother   . Heart disease Maternal Grandmother    History  Substance Use Topics  . Smoking status: Current Every Day Smoker -- 0.25 packs/day for 26 years    Types: Cigarettes    Last Attempt to Quit: 09/12/2012  . Smokeless tobacco: Never Used     Comment: 1 pack per week  . Alcohol Use: 0.6 oz/week    1 Cans of beer per week     Comment: 1 bottle of wine per week   OB History   Grav Para Term Preterm Abortions TAB SAB Ect Mult Living   3 1 1  2 1  1  1      Review of Systems  Constitutional: Negative for fever and chills.  Respiratory: Negative for cough, chest tightness and shortness of breath.   Cardiovascular: Negative for chest pain, palpitations and leg swelling.  Gastrointestinal: Positive for nausea, vomiting, abdominal pain and diarrhea. Negative for blood in stool.  Genitourinary: Negative for dysuria, flank pain and pelvic pain.  Musculoskeletal: Negative  for arthralgias, myalgias, neck pain and neck stiffness.  Skin: Negative for rash.  Neurological: Positive for headaches. Negative for dizziness and weakness.  All other systems reviewed and are negative.      Allergies  Penicillins; Shellfish allergy; and Other  Home Medications   Current Outpatient Rx  Name  Route  Sig  Dispense  Refill  . albuterol (PROVENTIL HFA;VENTOLIN HFA) 108 (90 BASE) MCG/ACT inhaler   Inhalation   Inhale 1-2 puffs into the lungs every 6 (six) hours as needed for wheezing or shortness of breath.         Marland Kitchen ibuprofen (ADVIL,MOTRIN) 200 MG tablet    Oral   Take 200 mg by mouth every 6 (six) hours as needed.          BP 123/81  Pulse 92  Temp(Src) 98.2 F (36.8 C)  Resp 20  SpO2 100%  LMP 11/27/2012 Physical Exam  Nursing note and vitals reviewed. Constitutional: She is oriented to person, place, and time. She appears well-developed and well-nourished.  Uncomfortable appearing  HENT:  Head: Normocephalic.  Eyes: Conjunctivae are normal.  Cardiovascular: Normal rate and regular rhythm.   Pulmonary/Chest: Effort normal and breath sounds normal. No respiratory distress. She has no wheezes. She has no rales.  Abdominal: Soft. Bowel sounds are normal. She exhibits no distension. There is no rebound and no guarding.  RUQ, RLQ tenderness  Musculoskeletal: She exhibits no edema.  Neurological: She is alert and oriented to person, place, and time.    ED Course  Procedures (including critical care time) Labs Review Labs Reviewed  CBC WITH DIFFERENTIAL - Abnormal; Notable for the following:    RDW 19.2 (*)    Platelets 467 (*)    All other components within normal limits  COMPREHENSIVE METABOLIC PANEL - Abnormal; Notable for the following:    Albumin 3.4 (*)    Total Bilirubin <0.2 (*)    All other components within normal limits  URINALYSIS, ROUTINE W REFLEX MICROSCOPIC - Abnormal; Notable for the following:    APPearance CLOUDY (*)    All other components within normal limits  LIPASE, BLOOD  I-STAT TROPOININ, ED   Imaging Review Ct Abdomen Pelvis W Contrast  04/18/2013   CLINICAL DATA:  Abdominal pain.  Headache and vomiting.  EXAM: CT ABDOMEN AND PELVIS WITH CONTRAST  TECHNIQUE: Multidetector CT imaging of the abdomen and pelvis was performed using the standard protocol following bolus administration of intravenous contrast.  CONTRAST:  67mL OMNIPAQUE IOHEXOL 300 MG/ML SOLN, 132mL OMNIPAQUE IOHEXOL 300 MG/ML SOLN  COMPARISON:  DG ABD ACUTE W/CHEST dated 04/18/2013; CT ABD/PELVIS W CM dated 01/17/2013; CT ABD/PELVIS W CM dated  06/22/2012  FINDINGS: Diffuse hepatic steatosis. The spleen, pancreas, and adrenal glands appear normal. Gallbladder surgically absent.  Kidneys and proximal ureters unremarkable. No pathologic upper abdominal adenopathy is observed. No pathologic pelvic adenopathy is observed. Appendix unremarkable.  Uterus surgically absent. Ovaries unremarkable. No dilated bowel observed.  IMPRESSION: 1. Diffuse hepatic steatosis. 2. No acute findings.   Electronically Signed   By: Sherryl Barters M.D.   On: 04/18/2013 10:55   Dg Abd Acute W/chest  04/18/2013   CLINICAL DATA:  Right-sided abdominal pain  EXAM: ACUTE ABDOMEN SERIES (ABDOMEN 2 VIEW & CHEST 1 VIEW)  COMPARISON:  None.  FINDINGS: There is no evidence of dilated bowel loops or free intraperitoneal air. Surgical clips in the gallbladder fossa. No radiopaque calculi or other significant radiographic abnormality is seen. Heart size and mediastinal contours are within  normal limits. Both lungs are clear.  IMPRESSION: Negative abdominal radiographs.  No acute cardiopulmonary disease.   Electronically Signed   By: Kathreen Devoid   On: 04/18/2013 09:41     EKG Interpretation None      MDM   Final diagnoses:  Abdominal pain  Abdominal muscle strain  Nausea vomiting and diarrhea  Headache    Patient with right sided abdominal pain after lifting a couch, and today nausea, vomiting, diarrhea. Patient is now unsure if she had nausea before lifting a couch. Her abdomen is soft palpation but tender to the right side. Pt's labs are normal. Will get CT abd/pelvis w contrast for further evaluation.   11:53 AM CT abd and pelvis negative. Pt drinking fluids in ED with no problems. She did complain of a headache, has chronic headaches, it is similar to prior, gradual onset, no injuries. Treated with dilaudid. Pt feeling better. I suspect her abdominal pain is most likely due to a muscular strain from lifting the couch. i think that her n/v/d is coincidental and is due  to a viral gastroenteritis. Home with reglan -she has it at home, asked for pain medication. norco 15 tabs. Follow up with pcp.   Filed Vitals:   04/18/13 0836 04/18/13 1115  BP: 123/81 103/66  Pulse: 92 79  Temp: 98.2 F (36.8 C)   Resp: 20 18  SpO2: 100% 98%       Renold Genta, PA-C 04/18/13 1156

## 2013-04-18 NOTE — ED Notes (Signed)
Patient transported to X-ray 

## 2013-04-18 NOTE — ED Notes (Signed)
Patient states she still cant give a  urine sample

## 2013-04-18 NOTE — ED Provider Notes (Signed)
Medical screening examination/treatment/procedure(s) were performed by non-physician practitioner and as supervising physician I was immediately available for consultation/collaboration.   EKG Interpretation   Date/Time:  Monday April 18 2013 09:21:15 EST Ventricular Rate:  83 PR Interval:  131 QRS Duration: 75 QT Interval:  382 QTC Calculation: 449 R Axis:   15 Text Interpretation:  Sinus rhythm Abnormal R-wave progression, early  transition No significant change since last tracing Confirmed by Eathen Budreau   MD, Brittan Mapel [phone]) on 04/18/2013 9:47:05 AM        Orpah Greek, MD 04/18/13 1157

## 2013-04-18 NOTE — Discharge Instructions (Signed)
Continue ibuprofen for pain. Take norco for severe pain. continue reglan for nausea. Follow up with your doctor.   Viral Gastroenteritis Viral gastroenteritis is also known as stomach flu. This condition affects the stomach and intestinal tract. It can cause sudden diarrhea and vomiting. The illness typically lasts 3 to 8 days. Most people develop an immune response that eventually gets rid of the virus. While this natural response develops, the virus can make you quite ill. CAUSES  Many different viruses can cause gastroenteritis, such as rotavirus or noroviruses. You can catch one of these viruses by consuming contaminated food or water. You may also catch a virus by sharing utensils or other personal items with an infected person or by touching a contaminated surface. SYMPTOMS  The most common symptoms are diarrhea and vomiting. These problems can cause a severe loss of body fluids (dehydration) and a body salt (electrolyte) imbalance. Other symptoms may include:  Fever.  Headache.  Fatigue.  Abdominal pain. DIAGNOSIS  Your caregiver can usually diagnose viral gastroenteritis based on your symptoms and a physical exam. A stool sample may also be taken to test for the presence of viruses or other infections. TREATMENT  This illness typically goes away on its own. Treatments are aimed at rehydration. The most serious cases of viral gastroenteritis involve vomiting so severely that you are not able to keep fluids down. In these cases, fluids must be given through an intravenous line (IV). HOME CARE INSTRUCTIONS   Drink enough fluids to keep your urine clear or pale yellow. Drink small amounts of fluids frequently and increase the amounts as tolerated.  Ask your caregiver for specific rehydration instructions.  Avoid:  Foods high in sugar.  Alcohol.  Carbonated drinks.  Tobacco.  Juice.  Caffeine drinks.  Extremely hot or cold fluids.  Fatty, greasy foods.  Too much intake  of anything at one time.  Dairy products until 24 to 48 hours after diarrhea stops.  You may consume probiotics. Probiotics are active cultures of beneficial bacteria. They may lessen the amount and number of diarrheal stools in adults. Probiotics can be found in yogurt with active cultures and in supplements.  Wash your hands well to avoid spreading the virus.  Only take over-the-counter or prescription medicines for pain, discomfort, or fever as directed by your caregiver. Do not give aspirin to children. Antidiarrheal medicines are not recommended.  Ask your caregiver if you should continue to take your regular prescribed and over-the-counter medicines.  Keep all follow-up appointments as directed by your caregiver. SEEK IMMEDIATE MEDICAL CARE IF:   You are unable to keep fluids down.  You do not urinate at least once every 6 to 8 hours.  You develop shortness of breath.  You notice blood in your stool or vomit. This may look like coffee grounds.  You have abdominal pain that increases or is concentrated in one small area (localized).  You have persistent vomiting or diarrhea.  You have a fever.  The patient is a child younger than 3 months, and he or she has a fever.  The patient is a child older than 3 months, and he or she has a fever and persistent symptoms.  The patient is a child older than 3 months, and he or she has a fever and symptoms suddenly get worse.  The patient is a baby, and he or she has no tears when crying. MAKE SURE YOU:   Understand these instructions.  Will watch your condition.  Will get help  right away if you are not doing well or get worse. Document Released: 02/03/2005 Document Revised: 04/28/2011 Document Reviewed: 11/20/2010 [facility] Patient Information 2014 [place].   Muscle Strain A muscle strain is an injury that occurs when a muscle is stretched beyond its normal length. Usually a small number of muscle fibers are torn when  this happens. Muscle strain is rated in degrees. First-degree strains have the least amount of muscle fiber tearing and pain. Second-degree and third-degree strains have increasingly more tearing and pain.  Usually, recovery from muscle strain takes 1 2 weeks. Complete healing takes 5 6 weeks.  CAUSES  Muscle strain happens when a sudden, violent force placed on a muscle stretches it too far. This may occur with lifting, sports, or a fall.  RISK FACTORS Muscle strain is especially common in athletes.  SIGNS AND SYMPTOMS At the site of the muscle strain, there may be:  Pain.  Bruising.  Swelling.  Difficulty using the muscle due to pain or lack of normal function. DIAGNOSIS  Your health care provider will perform a physical exam and ask about your medical history. TREATMENT  Often, the best treatment for a muscle strain is resting, icing, and applying cold compresses to the injured area.  HOME CARE INSTRUCTIONS   Use the PRICE method of treatment to promote muscle healing during the first 2 3 days after your injury. The PRICE method involves:  Protecting the muscle from being injured again.  Restricting your activity and resting the injured body part.  Icing your injury. To do this, put ice in a plastic bag. Place a towel between your skin and the bag. Then, apply the ice and leave it on from 15 20 minutes each hour. After the third day, switch to moist heat packs.  Apply compression to the injured area with a splint or elastic bandage. Be careful not to wrap it too tightly. This may interfere with blood circulation or increase swelling.  Elevate the injured body part above the level of your heart as often as you can.  Only take over-the-counter or prescription medicines for pain, discomfort, or fever as directed by your health care provider.  Warming up prior to exercise helps to prevent future muscle strains. SEEK MEDICAL CARE IF:   You have increasing pain or swelling in the  injured area.  You have numbness, tingling, or a significant loss of strength in the injured area. MAKE SURE YOU:   Understand these instructions.  Will watch your condition.  Will get help right away if you are not doing well or get worse. Document Released: 02/03/2005 Document Revised: 11/24/2012 Document Reviewed: 09/02/2012 [facility] Patient Information 2014 [place], [place].

## 2013-04-18 NOTE — Progress Notes (Signed)
[facility] CL provided pt with [employer] [employer] application. Patient orange Card had expired. CL contacted pt PCP [facility] and set patient up with an apt for re-enrollment on 3/10 at 12:00pm. Provided pt with apt information.

## 2013-04-18 NOTE — ED Notes (Signed)
Pt presents to ed with c/o abdominal pain, headache and vomiting since yesterday.Pt sts "I think I pulled something in my stomach when I lifted some heavy things"

## 2013-04-18 NOTE — ED Notes (Signed)
Made patient aware we needed a urine sample she stated she can not give sample at this time.

## 2013-04-25 ENCOUNTER — Encounter (HOSPITAL_COMMUNITY): Payer: Self-pay | Admitting: Emergency Medicine

## 2013-04-25 ENCOUNTER — Emergency Department (HOSPITAL_COMMUNITY)
Admission: EM | Admit: 2013-04-25 | Discharge: 2013-04-25 | Disposition: A | Discharge status: Home / Self Care | Payer: No Typology Code available for payment source | Attending: Emergency Medicine | Admitting: Emergency Medicine

## 2013-04-25 DIAGNOSIS — IMO0001 Reserved for inherently not codable concepts without codable children: Secondary | ICD-10-CM | POA: Insufficient documentation

## 2013-04-25 DIAGNOSIS — Z3202 Encounter for pregnancy test, result negative: Secondary | ICD-10-CM | POA: Insufficient documentation

## 2013-04-25 DIAGNOSIS — J45909 Unspecified asthma, uncomplicated: Secondary | ICD-10-CM | POA: Insufficient documentation

## 2013-04-25 DIAGNOSIS — R5381 Other malaise: Secondary | ICD-10-CM | POA: Insufficient documentation

## 2013-04-25 DIAGNOSIS — R1084 Generalized abdominal pain: Secondary | ICD-10-CM | POA: Insufficient documentation

## 2013-04-25 DIAGNOSIS — I252 Old myocardial infarction: Secondary | ICD-10-CM | POA: Insufficient documentation

## 2013-04-25 DIAGNOSIS — R197 Diarrhea, unspecified: Secondary | ICD-10-CM | POA: Insufficient documentation

## 2013-04-25 DIAGNOSIS — F17200 Nicotine dependence, unspecified, uncomplicated: Secondary | ICD-10-CM | POA: Insufficient documentation

## 2013-04-25 DIAGNOSIS — Z79899 Other long term (current) drug therapy: Secondary | ICD-10-CM | POA: Insufficient documentation

## 2013-04-25 DIAGNOSIS — Z880 Allergy status to penicillin: Secondary | ICD-10-CM | POA: Insufficient documentation

## 2013-04-25 DIAGNOSIS — R5383 Other fatigue: Secondary | ICD-10-CM

## 2013-04-25 DIAGNOSIS — N949 Unspecified condition associated with female genital organs and menstrual cycle: Secondary | ICD-10-CM | POA: Insufficient documentation

## 2013-04-25 DIAGNOSIS — R51 Headache: Secondary | ICD-10-CM | POA: Insufficient documentation

## 2013-04-25 DIAGNOSIS — R112 Nausea with vomiting, unspecified: Secondary | ICD-10-CM

## 2013-04-25 DIAGNOSIS — G8929 Other chronic pain: Secondary | ICD-10-CM | POA: Insufficient documentation

## 2013-04-25 LAB — COMPREHENSIVE METABOLIC PANEL
ALT: 26 U/L (ref 0–35)
AST: 22 U/L (ref 0–37)
Albumin: 3.4 g/dL — ABNORMAL LOW (ref 3.5–5.2)
Alkaline Phosphatase: 88 U/L (ref 39–117)
BUN: 5 mg/dL — ABNORMAL LOW (ref 6–23)
CO2: 24 mEq/L (ref 19–32)
Calcium: 8.8 mg/dL (ref 8.4–10.5)
Chloride: 101 mEq/L (ref 96–112)
Creatinine, Ser: 0.59 mg/dL (ref 0.50–1.10)
GFR calc Af Amer: >90 mL/min (ref >90)
GFR calc non Af Amer: >90 mL/min (ref >90)
Glucose, Bld: 105 mg/dL — ABNORMAL HIGH (ref 70–99)
Potassium: 4.3 mEq/L (ref 3.7–5.3)
Sodium: 139 mEq/L (ref 137–147)
Total Bilirubin: 0.2 mg/dL — ABNORMAL LOW (ref 0.3–1.2)
Total Protein: 7.2 g/dL (ref 6.0–8.3)

## 2013-04-25 LAB — CBC WITH DIFFERENTIAL/PLATELET
Basophils Absolute: 0 10*3/uL (ref 0.0–0.1)
Basophils Relative: 0 % (ref 0–1)
Eosinophils Absolute: 0.1 10*3/uL (ref 0.0–0.7)
Eosinophils Relative: 1 % (ref 0–5)
HCT: 37.5 % (ref 36.0–46.0)
Hemoglobin: 12.3 g/dL (ref 12.0–15.0)
Lymphocytes Relative: 33 % (ref 12–46)
Lymphs Abs: 2.2 10*3/uL (ref 0.7–4.0)
MCH: 27.8 pg (ref 26.0–34.0)
MCHC: 32.8 g/dL (ref 30.0–36.0)
MCV: 84.8 fL (ref 78.0–100.0)
Monocytes Absolute: 0.4 10*3/uL (ref 0.1–1.0)
Monocytes Relative: 6 % (ref 3–12)
Neutro Abs: 4 10*3/uL (ref 1.7–7.7)
Neutrophils Relative %: 60 % (ref 43–77)
Platelets: 511 10*3/uL — ABNORMAL HIGH (ref 150–400)
RBC: 4.42 MIL/uL (ref 3.87–5.11)
RDW: 19.6 % — ABNORMAL HIGH (ref 11.5–15.5)
WBC: 6.7 10*3/uL (ref 4.0–10.5)

## 2013-04-25 LAB — URINALYSIS, ROUTINE W REFLEX MICROSCOPIC
Bilirubin Urine: NEGATIVE
Glucose, UA: NEGATIVE mg/dL
Hgb urine dipstick: NEGATIVE
Ketones, ur: NEGATIVE mg/dL
Leukocytes, UA: NEGATIVE
Nitrite: NEGATIVE
Protein, ur: NEGATIVE mg/dL
Specific Gravity, Urine: 1.029 (ref 1.005–1.030)
Urobilinogen, UA: 0.2 mg/dL (ref 0.0–1.0)
pH: 5.5 (ref 5.0–8.0)

## 2013-04-25 LAB — LIPASE, BLOOD: Lipase: 27 U/L (ref 11–59)

## 2013-04-25 LAB — POC URINE PREG, ED: Preg Test, Ur: NEGATIVE

## 2013-04-25 MED ORDER — ONDANSETRON HCL 4 MG/2ML IJ SOLN
4.0000 mg | Freq: Once | INTRAMUSCULAR | Status: AC
  Administered 2013-04-25: 4 mg via INTRAVENOUS
  Filled 2013-04-25: qty 2

## 2013-04-25 MED ORDER — ONDANSETRON HCL 4 MG PO TABS: 4.0000 mg | ORAL_TABLET | Freq: Four times a day (QID) | ORAL | Status: DC

## 2013-04-25 MED ORDER — SODIUM CHLORIDE 0.9 % IV BOLUS (SEPSIS)
1000.0000 mL | Freq: Once | INTRAVENOUS | Status: AC
  Administered 2013-04-25: 1000 mL via INTRAVENOUS

## 2013-04-25 MED ORDER — IBUPROFEN 800 MG PO TABS: 800.0000 mg | ORAL_TABLET | Freq: Three times a day (TID) | ORAL | Status: DC

## 2013-04-25 MED ORDER — MORPHINE SULFATE 4 MG/ML IJ SOLN
4.0000 mg | Freq: Once | INTRAMUSCULAR | Status: AC
  Administered 2013-04-25: 4 mg via INTRAVENOUS
  Filled 2013-04-25: qty 1

## 2013-04-25 NOTE — ED Provider Notes (Signed)
CSN: [id number]     Arrival date & time 04/25/13  106 History   First MD Initiated Contact with Patient 04/25/13 1210     Chief Complaint  Patient presents with  . Emesis  . Diarrhea  . Headache  . Fatigue  . Abdominal Pain     (Consider location/radiation/quality/duration/timing/severity/associated sxs/prior Treatment) Patient is a 38 y.o. female presenting with vomiting, diarrhea, headaches, and abdominal pain. The history is provided by the patient. No language interpreter was used.  Emesis Severity:  Moderate Duration:  1 week Number of daily episodes:  4 Associated symptoms: abdominal pain, diarrhea, headaches and myalgias   Associated symptoms comment:  Symptoms have been present for the past one week. She was seen here on 04/18/13 for same and reports a negative work up at that time. She returned to work today but had to leave due to worsened symptoms of vomiting, diarrhea and myalgias. No known fever. No dysuria. She denies melena or hematemesis.  Diarrhea Associated symptoms: abdominal pain, headaches, myalgias and vomiting   Associated symptoms: no fever   Headache Associated symptoms: abdominal pain, diarrhea, fatigue, myalgias, nausea and vomiting   Associated symptoms: no cough and no fever   Abdominal Pain Associated symptoms: diarrhea, fatigue, nausea and vomiting   Associated symptoms: no chest pain, no cough, no dysuria, no fever and no shortness of breath     Past Medical History  Diagnosis Date  . Asthma   . EP (ectopic pregnancy)   . Ovarian tumor   . Tumor, thyroid   . Sleep apnea   . Chronic headaches   . Irregular heartbeat   . Chronic female pelvic pain 10/11/2012  . Abnormal uterine bleeding (AUB) 10/11/2012  . Cardiac arrest 1992    during delivery   Past Surgical History  Procedure Laterality Date  . Cholecystectomy  2005  . Unilateral salpingectomy  2009    Abdominal left salpingectomy  . Vaginal hysterectomy N/A 01/06/2013    Procedure:  HYSTERECTOMY VAGINAL;  Surgeon: Osborne Oman, MD;  Location: [facility];  Service: Gynecology;  Laterality: N/A;   Family History  Problem Relation Age of Onset  . Hypertension Mother   . Diabetes Mother   . Cancer Father   . Hyperlipidemia Father   . Hypertension Father   . Allergies Mother   . Heart disease Mother   . Heart disease Maternal Grandmother    History  Substance Use Topics  . Smoking status: Current Every Day Smoker -- 0.25 packs/day for 26 years    Types: Cigarettes    Last Attempt to Quit: 09/12/2012  . Smokeless tobacco: Never Used     Comment: 1 pack per week  . Alcohol Use: 0.6 oz/week    1 Cans of beer per week     Comment: 1 bottle of wine per week   OB History   Grav Para Term Preterm Abortions TAB SAB Ect Mult Living   3 1 1  2 1  1  1      Review of Systems  Constitutional: Positive for fatigue. Negative for fever.  Respiratory: Negative for cough and shortness of breath.   Cardiovascular: Negative for chest pain.  Gastrointestinal: Positive for nausea, vomiting, abdominal pain and diarrhea. Negative for blood in stool.  Genitourinary: Negative for dysuria.  Musculoskeletal: Positive for myalgias.  Skin: Negative for rash.  Neurological: Positive for headaches.      Allergies  Penicillins; Shellfish allergy; and Other  Home Medications   Current Outpatient Rx  Name  Route  Sig  Dispense  Refill  . albuterol (PROVENTIL HFA;VENTOLIN HFA) 108 (90 BASE) MCG/ACT inhaler   Inhalation   Inhale 1-2 puffs into the lungs every 6 (six) hours as needed for wheezing or shortness of breath.         . ARIPiprazole (ABILIFY) 20 MG tablet   Oral   Take 20 mg by mouth daily.         Marland Kitchen HYDROcodone-acetaminophen (NORCO) 5-325 MG per tablet   Oral   Take 1 tablet by mouth every 6 (six) hours as needed.   15 tablet   0   . ondansetron (ZOFRAN) 4 MG tablet   Oral   Take 4 mg by mouth every 8 (eight) hours as needed for nausea or vomiting.           BP 137/96  Pulse 87  Temp(Src) 98.3 F (36.8 C) (Oral)  Resp 18  SpO2 98%  LMP 11/27/2012 Physical Exam  Constitutional: She is oriented to person, place, and time. She appears well-developed and well-nourished.  HENT:  Head: Normocephalic.  Neck: Normal range of motion. Neck supple.  Cardiovascular: Normal rate and regular rhythm.   Pulmonary/Chest: Effort normal and breath sounds normal. She has no wheezes. She has no rales. She exhibits no tenderness.  Abdominal: Soft. Bowel sounds are normal. There is tenderness. There is no rebound and no guarding.  Soft abdomen that is diffusely tender.   Musculoskeletal: Normal range of motion.  Neurological: She is alert and oriented to person, place, and time. Coordination normal.  Skin: Skin is warm and dry. No rash noted.  Psychiatric: She has a normal mood and affect.    ED Course  Procedures (including critical care time) Labs Review Labs Reviewed  CBC WITH DIFFERENTIAL - Abnormal; Notable for the following:    RDW 19.6 (*)    Platelets 511 (*)    All other components within normal limits  COMPREHENSIVE METABOLIC PANEL - Abnormal; Notable for the following:    Glucose, Bld 105 (*)    BUN 5 (*)    Albumin 3.4 (*)    Total Bilirubin 0.2 (*)    All other components within normal limits  URINALYSIS, ROUTINE W REFLEX MICROSCOPIC - Abnormal; Notable for the following:    APPearance CLOUDY (*)    All other components within normal limits  LIPASE, BLOOD  POC URINE PREG, ED   Results for orders placed during the hospital encounter of 04/25/13  CBC WITH DIFFERENTIAL      Result Value Ref Range   WBC 6.7  4.0 - 10.5 K/uL   RBC 4.42  3.87 - 5.11 MIL/uL   Hemoglobin 12.3  12.0 - 15.0 g/dL   HCT 37.5  36.0 - 46.0 %   MCV 84.8  78.0 - 100.0 fL   MCH 27.8  26.0 - 34.0 pg   MCHC 32.8  30.0 - 36.0 g/dL   RDW 19.6 (*) 11.5 - 15.5 %   Platelets 511 (*) 150 - 400 K/uL   Neutrophils Relative % 60  43 - 77 %   Neutro Abs 4.0  1.7 -  7.7 K/uL   Lymphocytes Relative 33  12 - 46 %   Lymphs Abs 2.2  0.7 - 4.0 K/uL   Monocytes Relative 6  3 - 12 %   Monocytes Absolute 0.4  0.1 - 1.0 K/uL   Eosinophils Relative 1  0 - 5 %   Eosinophils Absolute 0.1  0.0 - 0.7  K/uL   Basophils Relative 0  0 - 1 %   Basophils Absolute 0.0  0.0 - 0.1 K/uL  COMPREHENSIVE METABOLIC PANEL      Result Value Ref Range   Sodium 139  137 - 147 mEq/L   Potassium 4.3  3.7 - 5.3 mEq/L   Chloride 101  96 - 112 mEq/L   CO2 24  19 - 32 mEq/L   Glucose, Bld 105 (*) 70 - 99 mg/dL   BUN 5 (*) 6 - 23 mg/dL   Creatinine, Ser 0.59  0.50 - 1.10 mg/dL   Calcium 8.8  8.4 - 10.5 mg/dL   Total Protein 7.2  6.0 - 8.3 g/dL   Albumin 3.4 (*) 3.5 - 5.2 g/dL   AST 22  0 - 37 U/L   ALT 26  0 - 35 U/L   Alkaline Phosphatase 88  39 - 117 U/L   Total Bilirubin 0.2 (*) 0.3 - 1.2 mg/dL   GFR calc non Af Amer >90  >90 mL/min   GFR calc Af Amer >90  >90 mL/min  LIPASE, BLOOD      Result Value Ref Range   Lipase 27  11 - 59 U/L  URINALYSIS, ROUTINE W REFLEX MICROSCOPIC      Result Value Ref Range   Color, Urine YELLOW  YELLOW   APPearance CLOUDY (*) CLEAR   Specific Gravity, Urine 1.029  1.005 - 1.030   pH 5.5  5.0 - 8.0   Glucose, UA NEGATIVE  NEGATIVE mg/dL   Hgb urine dipstick NEGATIVE  NEGATIVE   Bilirubin Urine NEGATIVE  NEGATIVE   Ketones, ur NEGATIVE  NEGATIVE mg/dL   Protein, ur NEGATIVE  NEGATIVE mg/dL   Urobilinogen, UA 0.2  0.0 - 1.0 mg/dL   Nitrite NEGATIVE  NEGATIVE   Leukocytes, UA NEGATIVE  NEGATIVE  POC URINE PREG, ED      Result Value Ref Range   Preg Test, Ur NEGATIVE  NEGATIVE   Dg Abd Acute W/chest  04/26/2013   CLINICAL DATA Abdominal pain.  EXAM ACUTE ABDOMEN SERIES (ABDOMEN 2 VIEW & CHEST 1 VIEW)  COMPARISON None.  FINDINGS There is no evidence of dilated bowel loops or free intraperitoneal air. No radiopaque calculi or other significant radiographic abnormality is seen. Status post cholecystectomy. Heart size and mediastinal contours  are within normal limits. Both lungs are clear.  IMPRESSION Negative abdominal radiographs.  No acute cardiopulmonary disease.  SIGNATURE  Electronically Signed   By: Sabino Dick M.D.   On: 04/26/2013 22:48     Imaging Review No results found.   EKG Interpretation None      MDM   Final diagnoses:  None    1. Nausea, vomiting and diarrhea  She feels some better with fluids and medications. NO further vomiting or diarrhea in ED. VSS. Review of patient's chart shows a negative CT abd/pel w/CM on 04/18/13 - do not feel repeat study will give any additional information with essentially normal lab studies and diffusely tender abdomen today. Will discharge home to follow up with her doctor for recheck in 1-2 days.    Dewaine Oats, PA-C 04/28/13 1608

## 2013-04-25 NOTE — Progress Notes (Signed)
[facility] CL spoke with patient on last ED visit on 3/2. At that time CL provided pt with a [employer] application because patient Marisa [name] had expired. CL set patient up with an apt for re-enrollment into the program at patient PCP [facility] on 3/10 at 12:00 pm. CL spoke with patient today to see if she had any questions, patient stated that she had all paperwork and would be going to apt tomorrow.

## 2013-04-25 NOTE — Discharge Instructions (Signed)
Diet The clear liquid diet consists of foods that are liquid or will become liquid at room temperature. Examples of foods allowed on a clear liquid diet include fruit juice, broth or bouillon, gelatin, or frozen ice pops. You should be able to see through the liquid. The purpose of this diet is to provide the necessary fluids, electrolytes (such as sodium and potassium), and energy to keep the body functioning during times when you are not able to consume a regular diet. A clear liquid diet should not be continued for long periods of time, as it is not nutritionally adequate.  A CLEAR LIQUID DIET MAY BE NEEDED:  When a sudden-onset (acute) condition occurs before or after surgery.   As the first step in oral feeding.   For fluid and electrolyte replacement in diarrheal diseases.   As a diet before certain medical tests are performed.  ADEQUACY The clear liquid diet is adequate only in ascorbic acid, according to the Recommended Dietary Allowances of the National Research Council.  CHOOSING FOODS Breads and Starches  Allowed: None are allowed.   Avoid: All are to be avoided.  Vegetables  Allowed: Strained vegetable juices.   Avoid: Any others.  Fruit  Allowed: Strained fruit juices and fruit drinks. Include 1 serving of citrus or vitamin C-enriched fruit juice daily.   Avoid: Any others.  Meat and Meat Substitutes  Allowed: None are allowed.   Avoid: All are to be avoided.  Milk Products  Allowed: None are allowed.   Avoid: All are to be avoided.  Soups and Combination Foods  Allowed: Clear bouillon, broth, or strained broth-based soups.   Avoid: Any others.  Desserts and Sweets  Allowed: Sugar, honey. High-protein gelatin. Flavored gelatin, ices, or frozen ice pops that do not contain milk.   Avoid: Any others.  Fats and Oils  Allowed: None are allowed.   Avoid: All are to be avoided.  Beverages  Allowed: Cereal  beverages, coffee (regular or decaffeinated), tea, or soda at the discretion of your health care provider.   Avoid: Any others.  Condiments  Allowed: Salt.   Avoid: Any others, including pepper.  Supplements  Allowed: Liquid nutrition beverages that you can see through.   Avoid: Any others that contain lactose or fiber. SAMPLE MEAL PLAN Breakfast  4 oz (120 mL) strained orange juice.   to 1 cup (120 to 240 mL) gelatin (plain or fortified).  1 cup (240 mL) beverage (coffee or tea).  Sugar, if desired. Midmorning Snack   cup (120 mL) gelatin (plain or fortified). Lunch  1 cup (240 mL) broth or consomm.  4 oz (120 mL) strained grapefruit juice.   cup (120 mL) gelatin (plain or fortified).  1 cup (240 mL) beverage (coffee or tea).  Sugar, if desired. Midafternoon Snack   cup (120 mL) fruit ice.   cup (120 mL) strained fruit juice. Dinner  1 cup (240 mL) broth or consomm.   cup (120 mL) cranberry juice.   cup (120 mL) flavored gelatin (plain or fortified).  1 cup (240 mL) beverage (coffee or tea).  Sugar, if desired. Evening Snack  4 oz (120 mL) strained apple juice (vitamin C-fortified).   cup (120 mL) flavored gelatin (plain or fortified). MAKE SURE YOU:  Understand these instructions.  Will watch your child's condition.  Will get help right away if your child is not doing well or gets worse. Document Released: 02/03/2005 Document Revised: 10/06/2012 Document Reviewed: 07/06/2012 ExitCare Patient Information 2014 [employer]. Viral   Gastroenteritis Viral gastroenteritis is also known as stomach flu. This condition affects the stomach and intestinal tract. It can cause sudden diarrhea and vomiting. The illness typically lasts 3 to 8 days. Most people develop an immune response that eventually gets rid of the virus. While this natural response develops, the virus can make you quite ill. CAUSES  Many different viruses can cause  gastroenteritis, such as rotavirus or noroviruses. You can catch one of these viruses by consuming contaminated food or water. You may also catch a virus by sharing utensils or other personal items with an infected person or by touching a contaminated surface. SYMPTOMS  The most common symptoms are diarrhea and vomiting. These problems can cause a severe loss of body fluids (dehydration) and a body salt (electrolyte) imbalance. Other symptoms may include:  Fever.  Headache.  Fatigue.  Abdominal pain. DIAGNOSIS  Your caregiver can usually diagnose viral gastroenteritis based on your symptoms and a physical exam. A stool sample may also be taken to test for the presence of viruses or other infections. TREATMENT  This illness typically goes away on its own. Treatments are aimed at rehydration. The most serious cases of viral gastroenteritis involve vomiting so severely that you are not able to keep fluids down. In these cases, fluids must be given through an intravenous line (IV). HOME CARE INSTRUCTIONS   Drink enough fluids to keep your urine clear or pale yellow. Drink small amounts of fluids frequently and increase the amounts as tolerated.  Ask your caregiver for specific rehydration instructions.  Avoid:  Foods high in sugar.  Alcohol.  Carbonated drinks.  Tobacco.  Juice.  Caffeine drinks.  Extremely hot or cold fluids.  Fatty, greasy foods.  Too much intake of anything at one time.  Dairy products until 24 to 48 hours after diarrhea stops.  You may consume probiotics. Probiotics are active cultures of beneficial bacteria. They may lessen the amount and number of diarrheal stools in adults. Probiotics can be found in yogurt with active cultures and in supplements.  Wash your hands well to avoid spreading the virus.  Only take over-the-counter or prescription medicines for pain, discomfort, or fever as directed by your caregiver. Do not give aspirin to children.  Antidiarrheal medicines are not recommended.  Ask your caregiver if you should continue to take your regular prescribed and over-the-counter medicines.  Keep all follow-up appointments as directed by your caregiver. SEEK IMMEDIATE MEDICAL CARE IF:   You are unable to keep fluids down.  You do not urinate at least once every 6 to 8 hours.  You develop shortness of breath.  You notice blood in your stool or vomit. This may look like coffee grounds.  You have abdominal pain that increases or is concentrated in one small area (localized).  You have persistent vomiting or diarrhea.  You have a fever.  The patient is a child younger than 3 months, and he or she has a fever.  The patient is a child older than 3 months, and he or she has a fever and persistent symptoms.  The patient is a child older than 3 months, and he or she has a fever and symptoms suddenly get worse.  The patient is a baby, and he or she has no tears when crying. MAKE SURE YOU:   Understand these instructions.  Will watch your condition.  Will get help right away if you are not doing well or get worse. Document Released: 02/03/2005 Document Revised: 04/28/2011 Document Reviewed: 11/20/2010   ExitCare Patient Information 2014 [employer].

## 2013-04-25 NOTE — ED Notes (Signed)
Pt c/o n/v/d, headache, weakness and abd pain since last week. Pt states that she was here on 3/2 and symptoms arent any better and she hasnt been able to go to work.

## 2013-04-26 ENCOUNTER — Emergency Department (HOSPITAL_COMMUNITY): Payer: Self-pay

## 2013-04-26 ENCOUNTER — Ambulatory Visit: Payer: No Typology Code available for payment source

## 2013-04-26 ENCOUNTER — Encounter (HOSPITAL_COMMUNITY): Payer: Self-pay | Admitting: Emergency Medicine

## 2013-04-26 ENCOUNTER — Inpatient Hospital Stay (HOSPITAL_COMMUNITY)
Admission: EM | Admit: 2013-04-26 | Discharge: 2013-04-29 | DRG: 378 | Disposition: A | Discharge status: Home / Self Care | Payer: No Typology Code available for payment source | Attending: Internal Medicine | Admitting: Internal Medicine

## 2013-04-26 DIAGNOSIS — R109 Unspecified abdominal pain: Secondary | ICD-10-CM | POA: Diagnosis present

## 2013-04-26 DIAGNOSIS — R1033 Periumbilical pain: Secondary | ICD-10-CM | POA: Diagnosis present

## 2013-04-26 DIAGNOSIS — R1115 Cyclical vomiting syndrome unrelated to migraine: Secondary | ICD-10-CM | POA: Diagnosis present

## 2013-04-26 DIAGNOSIS — R0789 Other chest pain: Secondary | ICD-10-CM | POA: Diagnosis present

## 2013-04-26 DIAGNOSIS — K5730 Diverticulosis of large intestine without perforation or abscess without bleeding: Secondary | ICD-10-CM | POA: Diagnosis present

## 2013-04-26 DIAGNOSIS — R51 Headache: Secondary | ICD-10-CM | POA: Diagnosis present

## 2013-04-26 DIAGNOSIS — Z833 Family history of diabetes mellitus: Secondary | ICD-10-CM

## 2013-04-26 DIAGNOSIS — R0600 Dyspnea, unspecified: Secondary | ICD-10-CM

## 2013-04-26 DIAGNOSIS — G4730 Sleep apnea, unspecified: Secondary | ICD-10-CM | POA: Diagnosis present

## 2013-04-26 DIAGNOSIS — Z6839 Body mass index (BMI) 39.0-39.9, adult: Secondary | ICD-10-CM

## 2013-04-26 DIAGNOSIS — Z9089 Acquired absence of other organs: Secondary | ICD-10-CM

## 2013-04-26 DIAGNOSIS — R112 Nausea with vomiting, unspecified: Secondary | ICD-10-CM | POA: Diagnosis present

## 2013-04-26 DIAGNOSIS — K625 Hemorrhage of anus and rectum: Secondary | ICD-10-CM | POA: Diagnosis present

## 2013-04-26 DIAGNOSIS — D62 Acute posthemorrhagic anemia: Secondary | ICD-10-CM | POA: Diagnosis present

## 2013-04-26 DIAGNOSIS — Z8249 Family history of ischemic heart disease and other diseases of the circulatory system: Secondary | ICD-10-CM

## 2013-04-26 DIAGNOSIS — J45909 Unspecified asthma, uncomplicated: Secondary | ICD-10-CM | POA: Diagnosis present

## 2013-04-26 DIAGNOSIS — Z79899 Other long term (current) drug therapy: Secondary | ICD-10-CM

## 2013-04-26 DIAGNOSIS — D126 Benign neoplasm of colon, unspecified: Secondary | ICD-10-CM | POA: Diagnosis present

## 2013-04-26 DIAGNOSIS — R609 Edema, unspecified: Secondary | ICD-10-CM | POA: Diagnosis present

## 2013-04-26 DIAGNOSIS — D649 Anemia, unspecified: Secondary | ICD-10-CM

## 2013-04-26 DIAGNOSIS — K921 Melena: Principal | ICD-10-CM | POA: Diagnosis present

## 2013-04-26 DIAGNOSIS — F17200 Nicotine dependence, unspecified, uncomplicated: Secondary | ICD-10-CM | POA: Diagnosis present

## 2013-04-26 DIAGNOSIS — K648 Other hemorrhoids: Secondary | ICD-10-CM | POA: Diagnosis present

## 2013-04-26 DIAGNOSIS — Z8674 Personal history of sudden cardiac arrest: Secondary | ICD-10-CM

## 2013-04-26 HISTORY — DX: Unspecified abdominal pain: R10.9

## 2013-04-26 HISTORY — DX: Anemia, unspecified: D64.9

## 2013-04-26 HISTORY — DX: Other chest pain: R07.89

## 2013-04-26 LAB — COMPREHENSIVE METABOLIC PANEL
ALT: 30 U/L (ref 0–35)
AST: 34 U/L (ref 0–37)
Albumin: 3.2 g/dL — ABNORMAL LOW (ref 3.5–5.2)
Alkaline Phosphatase: 86 U/L (ref 39–117)
BUN: 6 mg/dL (ref 6–23)
CO2: 22 mEq/L (ref 19–32)
Calcium: 9.3 mg/dL (ref 8.4–10.5)
Chloride: 102 mEq/L (ref 96–112)
Creatinine, Ser: 0.71 mg/dL (ref 0.50–1.10)
GFR calc Af Amer: >90 mL/min (ref >90)
GFR calc non Af Amer: >90 mL/min (ref >90)
Glucose, Bld: 109 mg/dL — ABNORMAL HIGH (ref 70–99)
Potassium: 3.8 mEq/L (ref 3.7–5.3)
Sodium: 140 mEq/L (ref 137–147)
Total Bilirubin: <0.2 mg/dL — ABNORMAL LOW (ref 0.3–1.2)
Total Protein: 7.5 g/dL (ref 6.0–8.3)

## 2013-04-26 LAB — CBC
HCT: 37.1 % (ref 36.0–46.0)
Hemoglobin: 12.4 g/dL (ref 12.0–15.0)
MCH: 28.1 pg (ref 26.0–34.0)
MCHC: 33.4 g/dL (ref 30.0–36.0)
MCV: 84.1 fL (ref 78.0–100.0)
Platelets: 488 10*3/uL — ABNORMAL HIGH (ref 150–400)
RBC: 4.41 MIL/uL (ref 3.87–5.11)
RDW: 19.1 % — ABNORMAL HIGH (ref 11.5–15.5)
WBC: 6.7 10*3/uL (ref 4.0–10.5)

## 2013-04-26 LAB — TROPONIN I: Troponin I: <0.3 ng/mL (ref <0.30)

## 2013-04-26 LAB — POC OCCULT BLOOD, ED: Fecal Occult Bld: POSITIVE — AB

## 2013-04-26 LAB — I-STAT TROPONIN, ED: Troponin i, poc: 0 ng/mL (ref 0.00–0.08)

## 2013-04-26 MED ORDER — ARIPIPRAZOLE 10 MG PO TABS
20.0000 mg | ORAL_TABLET | Freq: Every day | ORAL | Status: DC
  Administered 2013-04-27 – 2013-04-29 (×2): 20 mg via ORAL
  Filled 2013-04-26 (×3): qty 2

## 2013-04-26 MED ORDER — MORPHINE SULFATE 4 MG/ML IJ SOLN
4.0000 mg | Freq: Once | INTRAMUSCULAR | Status: AC
  Administered 2013-04-26: 4 mg via INTRAVENOUS
  Filled 2013-04-26: qty 1

## 2013-04-26 MED ORDER — ONDANSETRON HCL 4 MG/2ML IJ SOLN
4.0000 mg | Freq: Once | INTRAMUSCULAR | Status: AC
  Administered 2013-04-26: 4 mg via INTRAVENOUS
  Filled 2013-04-26: qty 2

## 2013-04-26 MED ORDER — ALBUTEROL SULFATE HFA 108 (90 BASE) MCG/ACT IN AERS: 1.0000 | INHALATION_SPRAY | Freq: Four times a day (QID) | RESPIRATORY_TRACT | Status: DC | PRN

## 2013-04-26 MED ORDER — SODIUM CHLORIDE 0.9 % IV SOLN
INTRAVENOUS | Status: AC
  Administered 2013-04-26: via INTRAVENOUS

## 2013-04-26 MED ORDER — ONDANSETRON HCL 4 MG/2ML IJ SOLN: 4.0000 mg | Freq: Three times a day (TID) | INTRAMUSCULAR | Status: DC | PRN

## 2013-04-26 MED ORDER — HYDROMORPHONE HCL PF 1 MG/ML IJ SOLN
1.0000 mg | INTRAMUSCULAR | Status: DC | PRN
  Administered 2013-04-26: 1 mg via INTRAVENOUS
  Filled 2013-04-26: qty 1

## 2013-04-26 MED ORDER — PANTOPRAZOLE SODIUM 40 MG IV SOLR
40.0000 mg | Freq: Two times a day (BID) | INTRAVENOUS | Status: DC
  Administered 2013-04-26 – 2013-04-29 (×6): 40 mg via INTRAVENOUS
  Filled 2013-04-26 (×7): qty 40

## 2013-04-26 MED ORDER — SODIUM CHLORIDE 0.9 % IV BOLUS (SEPSIS)
1000.0000 mL | Freq: Once | INTRAVENOUS | Status: AC
  Administered 2013-04-26: 1000 mL via INTRAVENOUS

## 2013-04-26 NOTE — H&P (Signed)
[facility] History and Physical  DANYIAH SUK [id number] DOB: [birth year] DOA: 04/26/2013  Referring physician: EDP PCP: Angelica Chessman, MD  Specialists:   Chief Complaint: Rectal Bleeding  HPI: Marisa [name] is a 38 y.o. female who presents to the ED with complaints with 1 episode of rectal bleeding , passing BRBPR this afternoon.   The bleeding was noticed right after she had an episode of diarrhea, and when she wiped she saw the bright red blood.  She also reports having diffuse ABD pain associated with the episode.  She also had chest tightness when it occurred.  She has had persistent nausea and vomiting and diarrhea over the past 2 weeks, and has been seen in the ED twice on 03/02 and on 03/09 .   She had a CT scan of her ABD performed on the  03/02 visit on which was negative for findings of colitis.        Review of Systems:  Constitutional: No Weight Loss, No Weight Gain, Night Sweats, Fevers, Chills, Fatigue, or Generalized Weakness HEENT: No Headaches, Difficulty Swallowing,Tooth/Dental Problems,Sore Throat,  No Sneezing, Rhinitis, Ear Ache, Nasal Congestion, or Post Nasal Drip,  Cardio-vascular:  +Chest pain, Orthopnea, PND, Edema in lower extremities, Anasarca, Dizziness, Palpitations  Resp: No Dyspnea, No DOE, No Productive Cough, No Non-Productive Cough, No Hemoptysis, No Change in Color of Mucus,  No Wheezing.    GI: No Heartburn, Indigestion, +Abdominal Pain, +Nausea, +Vomiting, +Diarrhea, No Hematemesis, No Melena,  +Hematochezia,  No Loss of Appetite  GU: No Dysuria, Change in Color of Urine, No Urgency or Frequency.  No flank pain.  Musculoskeletal: No Joint Pain or Swelling.  No Decreased Range of Motion. No Back Pain.  Neurologic: No Syncope, No Seizures, Muscle Weakness, Paresthesia, Vision Disturbance or Loss, No Diplopia, No Vertigo, No Difficulty Walking,  Skin: No Rash or Lesions. Psych: No Change in Mood or Affect. No Depression or Anxiety. No  Memory loss. No Confusion or Hallucinations    Past Medical History  Diagnosis Date  . Asthma   . EP (ectopic pregnancy)   . Ovarian tumor   . Tumor, thyroid   . Sleep apnea   . Chronic headaches   . Irregular heartbeat   . Chronic female pelvic pain 10/11/2012  . Abnormal uterine bleeding (AUB) 10/11/2012  . Cardiac arrest 1992    during delivery      Past Surgical History  Procedure Laterality Date  . Cholecystectomy  2005  . Unilateral salpingectomy  2009    Abdominal left salpingectomy  . Vaginal hysterectomy N/A 01/06/2013    Procedure: HYSTERECTOMY VAGINAL;  Surgeon: Osborne Oman, MD;  Location: [facility];  Service: Gynecology;  Laterality: N/A;       Prior to Admission medications   Medication Sig Start Date End Date Taking? Authorizing Provider  albuterol (PROVENTIL HFA;VENTOLIN HFA) 108 (90 BASE) MCG/ACT inhaler Inhale 1-2 puffs into the lungs every 6 (six) hours as needed for wheezing or shortness of breath.   Yes Historical Provider, MD  ARIPiprazole (ABILIFY) 20 MG tablet Take 20 mg by mouth daily.   Yes Historical Provider, MD  HYDROcodone-acetaminophen (NORCO/VICODIN) 5-325 MG per tablet Take 1 tablet by mouth every 6 (six) hours as needed (pain.). 04/18/13  Yes Tatyana A Kirichenko, PA-C  ondansetron (ZOFRAN) 4 MG tablet Take 1 tablet (4 mg total) by mouth every 6 (six) hours. 04/25/13  Yes Shari A Upstill, PA-C  ibuprofen (ADVIL,MOTRIN) 800 MG tablet Take 1 tablet (800 mg total)  by mouth 3 (three) times daily. 04/25/13   Shari A Upstill, PA-C      Allergies  Allergen Reactions  . Penicillins Anaphylaxis  . Shellfish Allergy Anaphylaxis  . Other     Mushroom-- swelling throat, eyes Animals-- swelling throat, eyes     Social History:  reports that she has been smoking Cigarettes.  She has a 6.5 pack-year smoking history. She has never used smokeless tobacco. She reports that she drinks about 0.6 ounces of alcohol per week. She reports that she does not use  illicit drugs.     Family History  Problem Relation Age of Onset  . Hypertension Mother   . Diabetes Mother   . Cancer Father   . Hyperlipidemia Father   . Hypertension Father   . Allergies Mother   . Heart disease Mother   . Heart disease Maternal Grandmother        Physical Exam:  GEN:  Pleasant  Obese  38 y.o. African American female  examined  and in no acute distress; cooperative with exam Filed Vitals:   04/26/13 1846  BP: 139/93  Pulse: 110  Resp: 19  SpO2: 98%   Blood pressure 139/93, pulse 110, resp. rate 19, last menstrual period 11/27/2012, SpO2 98.00%. PSYCH: SHe is alert and oriented x4; does not appear anxious does not appear depressed; affect is normal HEENT: Normocephalic and Atraumatic, Mucous membranes pink; PERRLA; EOM intact; Fundi:  Benign;  No scleral icterus, Nares: Patent, Oropharynx: Clear, Fair Dentition, Neck:  FROM, no cervical lymphadenopathy nor thyromegaly or carotid bruit; no JVD; Breasts:: Not examined CHEST WALL: No tenderness CHEST: Normal respiration, clear to auscultation bilaterally HEART: Regular rate and rhythm; no murmurs rubs or gallops BACK: No kyphosis or scoliosis; no CVA tenderness ABDOMEN: Positive Bowel Sounds, Obese, soft non-tender; no masses, no organomegaly, no pannus; no intertriginous candida. Rectal Exam: Not done EXTREMITIES: No  cyanosis, clubbing or edema; no ulcerations. Genitalia: not examined PULSES: 2+ and symmetric SKIN: Normal hydration no rash or ulceration CNS:  Alert and Oriented x 4,  No Focal Deficits.    Vascular: pulses palpable throughout    Labs on Admission:  Basic Metabolic Panel:  Recent Labs Lab 04/25/13 1208 04/26/13 1900  NA 139 140  K 4.3 3.8  CL 101 102  CO2 24 22  GLUCOSE 105* 109*  BUN 5* 6  CREATININE 0.59 0.71  CALCIUM 8.8 9.3   Liver Function Tests:  Recent Labs Lab 04/25/13 1208 04/26/13 1900  AST 22 34  ALT 26 30  ALKPHOS 88 86  BILITOT 0.2* <0.2*  PROT 7.2 7.5   ALBUMIN 3.4* 3.2*    Recent Labs Lab 04/25/13 1208  LIPASE 27   No results found for this basename: AMMONIA,  in the last 168 hours CBC:  Recent Labs Lab 04/25/13 1208 04/26/13 1900  WBC 6.7 6.7  NEUTROABS 4.0  --   HGB 12.3 12.4  HCT 37.5 37.1  MCV 84.8 84.1  PLT 511* 488*   Cardiac Enzymes: No results found for this basename: CKTOTAL, CKMB, CKMBINDEX, TROPONINI,  in the last 168 hours  BNP (last 3 results) No results found for this basename: PROBNP,  in the last 8760 hours CBG: No results found for this basename: GLUCAP,  in the last 168 hours  Radiological Exams on Admission: No results found.    EKG: Independently reviewed. Sinus Tachycardia  At 111    Assessment/Plan:   38 y.o. female with  Principal Problem:   Rectal bleed  Active Problems:   Atypical chest pain   Abdominal pain   Extrinsic asthma, unspecified   Current smoker   Nausea+ Vomiting +Diarrhea    1.   Rectal Bleed-   Monitor H/Hs, placed on IV Proonix,  Type and screen sent, transfuse if needed.   GI consult in AM.    2.   Atypical Chest Pain-  Cycle Troponins.   Monitor on Telemetry.  3.   Nausea + Vomiting+ Diarrhea-  Gastroenteritis vs IBD-   Clear liquid Diet, Anti-Emetics PRN,    4.   ABD Pain - Acute on Chronic,   Acute due to #3.    Pain Control PRN.    5.   Asthma- stable, Recue Inhaler QID PRN.    6.  SCDs for DVT prophylaxis.         Code Status: FULL CODE      Family Communication:  No Family present   Disposition Plan:   Observation Status  Time spent:  [place] [facility] Pager [phone]  If 7PM-7AM, please contact night-coverage [email or web address] Password [email or web address] 04/26/2013, 10:10 PM

## 2013-04-26 NOTE — ED Notes (Signed)
Pt c/o bleeding in her stool once today that was bright red about an hour ago. Pt also c/o chest tightness in center that started when pt had BM. Pt states she had shob, dizziness, weakness, and nausea with chest tightness. Pt also c/o headache that she also had when she was here on yesterday.

## 2013-04-26 NOTE — ED Provider Notes (Signed)
CSN: [id number]     Arrival date & time 04/26/13  1817 History   First MD Initiated Contact with Patient 04/26/13 1944     Chief Complaint  Patient presents with  . Rectal Bleeding  . Abdominal Pain  . Headache  . Chest Pain   (Consider location/radiation/quality/duration/timing/severity/associated sxs/prior Treatment) Patient is a 38 y.o. female presenting with hematochezia, abdominal pain, headaches, and chest pain. The history is provided by the patient. No language interpreter was used.  Rectal Bleeding Associated symptoms: abdominal pain, light-headedness and vomiting   Associated symptoms: no fever   Abdominal Pain Associated symptoms: chest pain, diarrhea, fatigue, hematochezia and vomiting   Associated symptoms: no chills, no dysuria and no fever   Headache Associated symptoms: abdominal pain, diarrhea, fatigue and vomiting   Associated symptoms: no fever   Chest Pain Associated symptoms: abdominal pain, fatigue, headache and vomiting   Associated symptoms: no fever and no palpitations    This is a 38yo F w/ PMH asthma, hemorrhoids s/p removal, prior TVH and cholecystectomy who presents w/ a 10 day hx of central abd pain and N/V/D. She has been evaluated twice in the ED, once on 3/2 and again yesterday with similar complaints. Work up was negative during both of these visits. Patient notes that she has not vomited yet today, though had been vomiting approx 4 times per day since 3/1. She has also had about 3-4 episodes per day of nonbloody, watery diarrhea over this same time period. Abd pain is located centrally and is sharp, constant and does not radiate. Patient notes that the reason she came to the ED today was because about 2.5 hrs ago she started feeling lightheaded, then went to the bathroom and had an episode of diarrhea and this time noticed blood in her stool, both on the toilet tissue and throughout the toilet bowl. She then began feeling as though her vision was blurry,  her chest became "tight," and she had onset of SOB. Chest pain is nonradiating and made worse w/ deep breathing, improved with lying on R side. Patient still having mild SOB and chest tightness, though this has improved since onset. She has prior hx of hemorrhoids, but had these removed in 2005 and has not had episodes of rectal bleeding since. She was last on abx in January. No hospitalizations in the last 90 days. No recent travel.   Past Medical History  Diagnosis Date  . Asthma   . EP (ectopic pregnancy)   . Ovarian tumor   . Tumor, thyroid   . Sleep apnea   . Chronic headaches   . Irregular heartbeat   . Chronic female pelvic pain 10/11/2012  . Abnormal uterine bleeding (AUB) 10/11/2012  . Cardiac arrest 1992    during delivery   Past Surgical History  Procedure Laterality Date  . Cholecystectomy  2005  . Unilateral salpingectomy  2009    Abdominal left salpingectomy  . Vaginal hysterectomy N/A 01/06/2013    Procedure: HYSTERECTOMY VAGINAL;  Surgeon: Osborne Oman, MD;  Location: [facility];  Service: Gynecology;  Laterality: N/A;   Family History  Problem Relation Age of Onset  . Hypertension Mother   . Diabetes Mother   . Cancer Father   . Hyperlipidemia Father   . Hypertension Father   . Allergies Mother   . Heart disease Mother   . Heart disease Maternal Grandmother    History  Substance Use Topics  . Smoking status: Current Every Day Smoker -- 0.25 packs/day for  26 years    Types: Cigarettes    Last Attempt to Quit: 09/12/2012  . Smokeless tobacco: Never Used     Comment: 1 pack per week  . Alcohol Use: 0.6 oz/week    1 Cans of beer per week     Comment: 1 bottle of wine per week   OB History   Grav Para Term Preterm Abortions TAB SAB Ect Mult Living   3 1 1  2 1  1  1      Review of Systems  Constitutional: Positive for fatigue. Negative for fever and chills.  Respiratory: Positive for chest tightness.   Cardiovascular: Positive for chest pain. Negative  for palpitations and leg swelling.  Gastrointestinal: Positive for vomiting, abdominal pain, diarrhea, blood in stool and hematochezia.  Genitourinary: Negative for dysuria.  Neurological: Positive for light-headedness and headaches.  All other systems reviewed and are negative.   Allergies  Penicillins; Shellfish allergy; and Other  Home Medications   Current Outpatient Rx  Name  Route  Sig  Dispense  Refill  . albuterol (PROVENTIL HFA;VENTOLIN HFA) 108 (90 BASE) MCG/ACT inhaler   Inhalation   Inhale 1-2 puffs into the lungs every 6 (six) hours as needed for wheezing or shortness of breath.         . ARIPiprazole (ABILIFY) 20 MG tablet   Oral   Take 20 mg by mouth daily.         Marland Kitchen HYDROcodone-acetaminophen (NORCO/VICODIN) 5-325 MG per tablet   Oral   Take 1 tablet by mouth every 6 (six) hours as needed (pain.).         Marland Kitchen ondansetron (ZOFRAN) 4 MG tablet   Oral   Take 1 tablet (4 mg total) by mouth every 6 (six) hours.   12 tablet   0   . ibuprofen (ADVIL,MOTRIN) 800 MG tablet   Oral   Take 1 tablet (800 mg total) by mouth 3 (three) times daily.   21 tablet   0    BP 139/93  Pulse 110  Resp 19  SpO2 98%  LMP 11/27/2012 Physical Exam  Constitutional: She is oriented to person, place, and time. She appears well-developed and well-nourished.  Tired appearing  HENT:  Head: Normocephalic and atraumatic.  Dry lips, oropharynx is moist  Eyes: Conjunctivae are normal.  Cardiovascular: Regular rhythm.   tachycardic  Pulmonary/Chest: Effort normal and breath sounds normal.  Abdominal: Soft. Bowel sounds are normal. She exhibits no distension. There is tenderness. There is no rebound.  TTP over epigastrium and periumbilical regions  Rectal exam: normal appearing anus, no visible blood; no palpable abnormality in rectal vault, FOBT +  Musculoskeletal: She exhibits no edema.  Neurological: She is alert and oriented to person, place, and time. No cranial nerve  deficit.  Skin: Skin is warm and dry.    ED Course  Procedures (including critical care time) Labs Review Labs Reviewed  CBC - Abnormal; Notable for the following:    RDW 19.1 (*)    Platelets 488 (*)    All other components within normal limits  COMPREHENSIVE METABOLIC PANEL - Abnormal; Notable for the following:    Glucose, Bld 109 (*)    Albumin 3.2 (*)    Total Bilirubin <0.2 (*)    All other components within normal limits  POC OCCULT BLOOD, ED - Abnormal; Notable for the following:    Fecal Occult Bld POSITIVE (*)    All other components within normal limits  I-STAT TROPOININ, ED  Imaging Review No results found.   EKG Interpretation None      MDM   The etiology of patient's N/V/D and abd pain is unclear. Given she is still in the 2 week window, this is still considered acute diarrhea. It is possible that patient had such frequent bowel movements that she irritated her rectum/anus, which is a possible cause of her rectal bleeding. However, it sounds as though she had quite a substantial amount of BRBPR today. There was no visible bleeding on rectal exam nor was there any visual or palpable abnormality to explain the bleeding. FOBT positive. Await CBC, POC Troponin, EKG, CMP. Will treat w/ morphine and zofran for now and give 1L NS bolus.  9:49 PM Patient feeling much better. CBC shows stable Hb. CMP unremarkable. Troponin negative. Given her persistent N/V/D and 3 visits to ED in the past week, I think patient requires admission for persistent symptoms. She will likely need colonoscopy for suspected LGIB.   Dr. Audie Pinto spoke with hospitalist and patient has been accepted for admission to [facility] obs.  Rebecca Eaton, MD 04/26/13 2152

## 2013-04-27 ENCOUNTER — Encounter (HOSPITAL_COMMUNITY): Payer: Self-pay

## 2013-04-27 ENCOUNTER — Inpatient Hospital Stay (HOSPITAL_COMMUNITY): Payer: No Typology Code available for payment source

## 2013-04-27 DIAGNOSIS — R0609 Other forms of dyspnea: Secondary | ICD-10-CM

## 2013-04-27 DIAGNOSIS — D649 Anemia, unspecified: Secondary | ICD-10-CM

## 2013-04-27 DIAGNOSIS — R1115 Cyclical vomiting syndrome unrelated to migraine: Secondary | ICD-10-CM

## 2013-04-27 DIAGNOSIS — R0989 Other specified symptoms and signs involving the circulatory and respiratory systems: Secondary | ICD-10-CM

## 2013-04-27 HISTORY — DX: Cyclical vomiting syndrome unrelated to migraine: R11.15

## 2013-04-27 LAB — HEMOGLOBIN AND HEMATOCRIT, BLOOD
HCT: 33.5 % — ABNORMAL LOW (ref 36.0–46.0)
HCT: 32.8 % — ABNORMAL LOW (ref 36.0–46.0)
Hemoglobin: 10.8 g/dL — ABNORMAL LOW (ref 12.0–15.0)
Hemoglobin: 10.7 g/dL — ABNORMAL LOW (ref 12.0–15.0)

## 2013-04-27 LAB — BASIC METABOLIC PANEL
BUN: 7 mg/dL (ref 6–23)
CO2: 25 mEq/L (ref 19–32)
Calcium: 8.1 mg/dL — ABNORMAL LOW (ref 8.4–10.5)
Chloride: 105 mEq/L (ref 96–112)
Creatinine, Ser: 0.76 mg/dL (ref 0.50–1.10)
GFR calc Af Amer: >90 mL/min (ref >90)
GFR calc non Af Amer: >90 mL/min (ref >90)
Glucose, Bld: 89 mg/dL (ref 70–99)
Potassium: 3.7 mEq/L (ref 3.7–5.3)
Sodium: 140 mEq/L (ref 137–147)

## 2013-04-27 LAB — CBC
HCT: 32.8 % — ABNORMAL LOW (ref 36.0–46.0)
Hemoglobin: 10.3 g/dL — ABNORMAL LOW (ref 12.0–15.0)
MCH: 27.2 pg (ref 26.0–34.0)
MCHC: 31.4 g/dL (ref 30.0–36.0)
MCV: 86.5 fL (ref 78.0–100.0)
Platelets: 402 10*3/uL — ABNORMAL HIGH (ref 150–400)
RBC: 3.79 MIL/uL — ABNORMAL LOW (ref 3.87–5.11)
RDW: 19.6 % — ABNORMAL HIGH (ref 11.5–15.5)
WBC: 6.6 10*3/uL (ref 4.0–10.5)

## 2013-04-27 LAB — TROPONIN I
Troponin I: <0.3 ng/mL (ref <0.30)
Troponin I: <0.3 ng/mL (ref <0.30)

## 2013-04-27 MED ORDER — HYDROMORPHONE HCL PF 1 MG/ML IJ SOLN
0.5000 mg | INTRAMUSCULAR | Status: DC | PRN
  Administered 2013-04-27 – 2013-04-28 (×8): 1 mg via INTRAVENOUS
  Filled 2013-04-27 (×8): qty 1

## 2013-04-27 MED ORDER — IOHEXOL 350 MG/ML SOLN
100.0000 mL | Freq: Once | INTRAVENOUS | Status: AC | PRN
  Administered 2013-04-27: 100 mL via INTRAVENOUS

## 2013-04-27 MED ORDER — ACETAMINOPHEN 325 MG PO TABS: 650.0000 mg | ORAL_TABLET | Freq: Four times a day (QID) | ORAL | Status: DC | PRN

## 2013-04-27 MED ORDER — CIPROFLOXACIN IN D5W 400 MG/200ML IV SOLN
400.0000 mg | Freq: Two times a day (BID) | INTRAVENOUS | Status: DC
  Administered 2013-04-27 – 2013-04-29 (×4): 400 mg via INTRAVENOUS
  Filled 2013-04-27 (×5): qty 200

## 2013-04-27 MED ORDER — ONDANSETRON HCL 4 MG PO TABS: 4.0000 mg | ORAL_TABLET | Freq: Four times a day (QID) | ORAL | Status: DC | PRN

## 2013-04-27 MED ORDER — ACETAMINOPHEN 650 MG RE SUPP: 650.0000 mg | Freq: Four times a day (QID) | RECTAL | Status: DC | PRN

## 2013-04-27 MED ORDER — SODIUM CHLORIDE 0.9 % IV SOLN
INTRAVENOUS | Status: DC
  Administered 2013-04-27: 01:00:00 via INTRAVENOUS
  Administered 2013-04-27: 75 mL/h via INTRAVENOUS
  Administered 2013-04-28: 01:00:00 via INTRAVENOUS

## 2013-04-27 MED ORDER — TRAZODONE 25 MG HALF TABLET
25.0000 mg | ORAL_TABLET | Freq: Once | ORAL | Status: AC
  Administered 2013-04-27: 25 mg via ORAL
  Filled 2013-04-27: qty 1

## 2013-04-27 MED ORDER — ALBUTEROL SULFATE (2.5 MG/3ML) 0.083% IN NEBU: 2.5000 mg | INHALATION_SOLUTION | Freq: Four times a day (QID) | RESPIRATORY_TRACT | Status: DC | PRN

## 2013-04-27 MED ORDER — METRONIDAZOLE IN NACL 5-0.79 MG/ML-% IV SOLN
500.0000 mg | Freq: Three times a day (TID) | INTRAVENOUS | Status: DC
  Administered 2013-04-27 – 2013-04-29 (×6): 500 mg via INTRAVENOUS
  Filled 2013-04-27 (×8): qty 100

## 2013-04-27 MED ORDER — ALUM & MAG HYDROXIDE-SIMETH 200-200-20 MG/5ML PO SUSP: 30.0000 mL | Freq: Four times a day (QID) | ORAL | Status: DC | PRN

## 2013-04-27 MED ORDER — ONDANSETRON HCL 4 MG/2ML IJ SOLN
4.0000 mg | Freq: Four times a day (QID) | INTRAMUSCULAR | Status: DC | PRN
  Administered 2013-04-27 – 2013-04-29 (×6): 4 mg via INTRAVENOUS
  Filled 2013-04-27 (×7): qty 2

## 2013-04-27 MED ORDER — OXYCODONE HCL 5 MG PO TABS
5.0000 mg | ORAL_TABLET | ORAL | Status: DC | PRN
Start: 2013-04-27 — End: 2013-04-29
  Filled 2013-04-27: qty 1

## 2013-04-27 NOTE — Consult Note (Signed)
[facility] Consultation Note  Referring Provider:  Dr. Rowe Clack [facility]) Primary Care Physician:  Angelica Chessman, MD  Reason for Consultation:  Abdominal pain, diarrhea, blood in stool  HPI: Marisa [name] is a 38 y.o. female admitted for, and whom we've been asked to see on behalf of, abdominal pain, blood in stool, diarrhea.  She has had couple weeks of constant sharp periumbilical pain, with frequent bloody diarrhea.  No fevers.  No recent course of antibiotics prior to her admission.  Recent CT scan abdomen showed fatty liver, but no other acute abnormalities.  Slight interval decrease in hemoglobin.  Had colonoscopy about 15 years ago for blood in stool, but not as severe as current presentation, no findings per patient.  Has had periodic bouts of multiple different types of abdominal pain over the past several years, but patient describes this as a unique pain.   Past Medical History  Diagnosis Date  . Asthma   . EP (ectopic pregnancy)   . Ovarian tumor   . Tumor, thyroid   . Sleep apnea   . Chronic headaches   . Irregular heartbeat   . Chronic female pelvic pain 10/11/2012  . Abnormal uterine bleeding (AUB) 10/11/2012  . Cardiac arrest 1992    during delivery  . Shortness of breath   . Anemia     Past Surgical History  Procedure Laterality Date  . Cholecystectomy  2005  . Unilateral salpingectomy  2009    Abdominal left salpingectomy  . Vaginal hysterectomy N/A 01/06/2013    Procedure: HYSTERECTOMY VAGINAL;  Surgeon: Osborne Oman, MD;  Location: [facility];  Service: Gynecology;  Laterality: N/A;    Prior to Admission medications   Medication Sig Start Date End Date Taking? Authorizing Provider  albuterol (PROVENTIL HFA;VENTOLIN HFA) 108 (90 BASE) MCG/ACT inhaler Inhale 1-2 puffs into the lungs every 6 (six) hours as needed for wheezing or shortness of breath.   Yes Historical Provider, MD  ARIPiprazole (ABILIFY) 20 MG tablet Take 20 mg by mouth  daily.   Yes Historical Provider, MD  HYDROcodone-acetaminophen (NORCO/VICODIN) 5-325 MG per tablet Take 1 tablet by mouth every 6 (six) hours as needed (pain.). 04/18/13  Yes Tatyana A Kirichenko, PA-C  ondansetron (ZOFRAN) 4 MG tablet Take 1 tablet (4 mg total) by mouth every 6 (six) hours. 04/25/13  Yes Shari A Upstill, PA-C  ibuprofen (ADVIL,MOTRIN) 800 MG tablet Take 1 tablet (800 mg total) by mouth 3 (three) times daily. 04/25/13   Dewaine Oats, PA-C    Current Facility-Administered Medications  Medication Dose Route Frequency Provider Last Rate Last Dose  . 0.9 %  sodium chloride infusion   Intravenous Continuous Theressa Millard, MD 75 mL/hr at 04/27/13 0819 75 mL/hr at 04/27/13 0819  . acetaminophen (TYLENOL) tablet 650 mg  650 mg Oral Q6H PRN Theressa Millard, MD       Or  . acetaminophen (TYLENOL) suppository 650 mg  650 mg Rectal Q6H PRN Theressa Millard, MD      . albuterol (PROVENTIL) (2.5 MG/3ML) 0.083% nebulizer solution 2.5 mg  2.5 mg Nebulization Q6H PRN Dot Lanes, MD      . alum & mag hydroxide-simeth (MAALOX/MYLANTA) 200-200-20 MG/5ML suspension 30 mL  30 mL Oral Q6H PRN Theressa Millard, MD      . ARIPiprazole (ABILIFY) tablet 20 mg  20 mg Oral Daily Theressa Millard, MD   20 mg at 04/27/13 1001  . ciprofloxacin (CIPRO) IVPB 400 mg  400 mg Intravenous Q12H Kinnie Feil, MD      . HYDROmorphone (DILAUDID) injection 0.5-1 mg  0.5-1 mg Intravenous Q3H PRN Theressa Millard, MD   1 mg at 04/27/13 1132  . metroNIDAZOLE (FLAGYL) IVPB 500 mg  500 mg Intravenous Q8H Kinnie Feil, MD      . ondansetron (ZOFRAN) tablet 4 mg  4 mg Oral Q6H PRN Theressa Millard, MD       Or  . ondansetron (ZOFRAN) injection 4 mg  4 mg Intravenous Q6H PRN Theressa Millard, MD   4 mg at 04/27/13 0818  . oxyCODONE (Oxy IR/ROXICODONE) immediate release tablet 5 mg  5 mg Oral Q4H PRN Theressa Millard, MD      . pantoprazole (PROTONIX) injection 40 mg  40 mg Intravenous Q12H  Theressa Millard, MD   40 mg at 04/27/13 1001    Allergies as of 04/26/2013 - Review Complete 04/26/2013  Allergen Reaction Noted  . Penicillins Anaphylaxis 10/25/2011  . Shellfish allergy Anaphylaxis 10/25/2011  . Other  09/07/2012    Family History  Problem Relation Age of Onset  . Hypertension Mother   . Diabetes Mother   . Cancer Father   . Hyperlipidemia Father   . Hypertension Father   . Allergies Mother   . Heart disease Mother   . Heart disease Maternal Grandmother     History   Social History  . Marital Status: Married    Spouse Name: N/A    Number of Children: 1  . Years of Education: N/A   Occupational History  . SITTER    Social History Main Topics  . Smoking status: Current Every Day Smoker -- 0.25 packs/day for 11 years    Types: Cigarettes    Last Attempt to Quit: 09/12/2012  . Smokeless tobacco: Never Used     Comment: 1 pack per week  . Alcohol Use: 4.8 oz/week    1 Cans of beer, 7 Glasses of wine per week     Comment: 1 bottle of wine per week  . Drug Use: No  . Sexual Activity: Yes    Birth Control/ Protection: None   Other Topics Concern  . Not on file   Social History Narrative  . No narrative on file    Review of Systems: ROS Dr. Arnoldo Morale 04/26/13 reviewed and I agree  Physical Exam: Vital signs in last 24 hours: Temp:  [97.7 F (36.5 C)-98.2 F (36.8 C)] 98.2 F (36.8 C) (03/11 1411) Pulse Rate:  [69-110] 69 (03/11 1411) Resp:  [16-19] 16 (03/11 1411) BP: (114-139)/(69-93) 114/71 mmHg (03/11 1411) SpO2:  [98 %-100 %] 99 % (03/11 1411) Weight:  [106.369 kg (234 lb 8 oz)] 106.369 kg (234 lb 8 oz) (03/11 0109) Last BM Date: 04/26/13 General:   Alert,  Well-developed, overweight, NAD Head:  Normocephalic and atraumatic. Eyes:  Sclera clear, no icterus.   Conjunctiva pink. Ears:  Normal auditory acuity. Nose:  No deformity, discharge,  or lesions. Mouth:  No deformity or lesions.  Oropharynx pink & moist. Neck:  Supple; no  masses or thyromegaly. Lungs:  Clear throughout to auscultation.   No wheezes, crackles, or rhonchi. No acute distress. Heart:  Regular rate and rhythm; no murmurs, clicks, rubs,  or gallops. Abdomen:  Soft, protuberant, mild generalized tenderness without peritonitis. No masses, hepatosplenomegaly or hernias noted. Normal bowel sounds, without guarding, and without rebound.     Msk:  Symmetrical without gross deformities. Normal posture. Pulses:  Normal pulses noted. Extremities:  Without clubbing or edema. Neurologic:  Alert and  oriented x4;  grossly normal neurologically. Skin:  Intact without significant lesions or rashes. Psych:  Alert and cooperative. Normal mood and affect.   Lab Results:  Recent Labs  04/25/13 1208 04/26/13 1900 04/27/13 0350 04/27/13 1303  WBC 6.7 6.7 6.6  --   HGB 12.3 12.4 10.3* 10.8*  HCT 37.5 37.1 32.8* 33.5*  PLT 511* 488* 402*  --    BMET  Recent Labs  04/25/13 1208 04/26/13 1900 04/27/13 0350  NA 139 140 140  K 4.3 3.8 3.7  CL 101 102 105  CO2 24 22 25   GLUCOSE 105* 109* 89  BUN 5* 6 7  CREATININE 0.59 0.71 0.76  CALCIUM 8.8 9.3 8.1*   LFT  Recent Labs  04/26/13 1900  PROT 7.5  ALBUMIN 3.2*  AST 34  ALT 30  ALKPHOS 86  BILITOT <0.2*   PT/INR No results found for this basename: LABPROT, INR,  in the last 72 hours  Studies/Results: Dg Abd Acute W/chest  04/26/2013   CLINICAL DATA Abdominal pain.  EXAM ACUTE ABDOMEN SERIES (ABDOMEN 2 VIEW & CHEST 1 VIEW)  COMPARISON None.  FINDINGS There is no evidence of dilated bowel loops or free intraperitoneal air. No radiopaque calculi or other significant radiographic abnormality is seen. Status post cholecystectomy. Heart size and mediastinal contours are within normal limits. Both lungs are clear.  IMPRESSION Negative abdominal radiographs.  No acute cardiopulmonary disease.  SIGNATURE  Electronically Signed   By: Sabino Dick M.D.   On: 04/26/2013 22:48   Impression:  1.  Abdominal  pain. Unclear etiology. 2.  Blood in stool. 3.  Anemia. 4.  Diarrhea.  Plan:  1.  CT angiogram abdomen planned for today. 2.  Agree with stool studies and empiric antibiotics. 3.  If stool studies and CT study are unrevealing, would consider colonoscopy as next step in management. 4.  [facility] will follow.   LOS: 1 day   Letizia Hook M  04/27/2013, 2:38 PM

## 2013-04-27 NOTE — Progress Notes (Signed)
INITIAL NUTRITION ASSESSMENT  DOCUMENTATION CODES Per approved criteria  -Obesity Unspecified   INTERVENTION: -Recommend Resource Breeze po BID, each supplement provides 250 kcal and 9 grams of protein as diet advancement tolerated -Will continue to monitor   NUTRITION DIAGNOSIS: Inadequate oral intake related to n/v as evidenced by PO intake <75% for 2.5 weeks.   Goal: Pt to meet >/= 90% of their estimated nutrition needs    Monitor:  Diet order, total protein/energy intake, GI profile, labs, weight  Reason for Assessment: MST  38 y.o. female  Admitting Dx: Rectal bleed  ASSESSMENT:  Marisa [name] is a 38 y.o. female who presents to the ED with complaints with 1 episode of rectal bleeding , passing BRBPR this afternoon. The bleeding was noticed right after she had an episode of diarrhea, and when she wiped she saw the bright red blood. She also reports having diffuse ABD pain associated with the episode. She also had chest tightness when it occurred. She has had persistent nausea and vomiting and diarrhea over the past 2 weeks, and has been seen in the ED twice on 03/02 and on 03/09   -Pt reported 5-10 lbs unintentional wt loss in past 2.5 weeks -Has been unable to tolerate full meals d/t n/v and feelings of early satiety -Diet recall indicated pt eating 1-2 bites of 3-4 meals daily. Would have emesis occasionally after eating -Only protein pt was able to tolerate was eggs; little intake of yogurt and/or other animal protein sources -NPO for GI consult. Was willing to trial [employer] as diet advancement tolerated -MD noted pt likely with gastroenteritis vs IBD. One episode rectal bleeding -No evident signs of muscle wasting or subcutaneous fat loss  Height: Ht Readings from Last 1 Encounters:  04/27/13 5\' 5"  (1.651 m)    Weight: Wt Readings from Last 1 Encounters:  04/27/13 234 lb 8 oz (106.369 kg)    Ideal Body Weight: 125 lbs  % Ideal Body Weight:  187%  Wt Readings from Last 10 Encounters:  04/27/13 234 lb 8 oz (106.369 kg)  02/07/13 230 lb 11.2 oz (104.645 kg)  01/24/13 224 lb 11.2 oz (101.923 kg)  01/16/13 210 lb (95.255 kg)  12/15/12 220 lb (99.791 kg)  12/15/12 220 lb (99.791 kg)  12/29/12 220 lb (99.791 kg)  12/27/12 230 lb (104.327 kg)  12/20/12 227 lb 6.4 oz (103.148 kg)  12/09/12 225 lb 12.8 oz (102.422 kg)    Usual Body Weight: 240 lbs  % Usual Body Weight: 98%  BMI:  Body mass index is 39.02 kg/(m^2). Obesity II  Estimated Nutritional Needs: Kcal: 1500-1700 Protein: 80-90 gram Fluid: >/=1500 ml/daily  Skin: WDL  Diet Order: NPO  EDUCATION NEEDS: -No education needs identified at this time   Intake/Output Summary (Last 24 hours) at 04/27/13 1129 Last data filed at 04/27/13 0900  Gross per 24 hour  Intake 1839.58 ml  Output      0 ml  Net 1839.58 ml    Last BM: 3/10   Labs:   Recent Labs Lab 04/25/13 1208 04/26/13 1900 04/27/13 0350  NA 139 140 140  K 4.3 3.8 3.7  CL 101 102 105  CO2 24 22 25   BUN 5* 6 7  CREATININE 0.59 0.71 0.76  CALCIUM 8.8 9.3 8.1*  GLUCOSE 105* 109* 89    CBG (last 3)  No results found for this basename: GLUCAP,  in the last 72 hours  Scheduled Meds: . sodium chloride   Intravenous STAT  .  ARIPiprazole  20 mg Oral Daily  . pantoprazole (PROTONIX) IV  40 mg Intravenous Q12H    Continuous Infusions: . sodium chloride 75 mL/hr (04/27/13 2633)    Past Medical History  Diagnosis Date  . Asthma   . EP (ectopic pregnancy)   . Ovarian tumor   . Tumor, thyroid   . Sleep apnea   . Chronic headaches   . Irregular heartbeat   . Chronic female pelvic pain 10/11/2012  . Abnormal uterine bleeding (AUB) 10/11/2012  . Cardiac arrest 1992    during delivery  . Shortness of breath   . Anemia     Past Surgical History  Procedure Laterality Date  . Cholecystectomy  2005  . Unilateral salpingectomy  2009    Abdominal left salpingectomy  . Vaginal  hysterectomy N/A 01/06/2013    Procedure: HYSTERECTOMY VAGINAL;  Surgeon: Osborne Oman, MD;  Location: [facility];  Service: Gynecology;  Laterality: N/A;    Atlee Abide MS RD LDN Clinical Dietitian [phone]

## 2013-04-27 NOTE — Progress Notes (Signed)
UR completed. Patient changed to inpatient- requiring IVF @ 75cc/hr and IV antibiotics.  

## 2013-04-27 NOTE — Progress Notes (Signed)
[facility] PROGRESS NOTE  Marisa [name] SAY:[id number] DOB: December 03, 1975 DOA: 04/26/2013 PCP: Angelica Chessman, MD  Assessment/Plan: Rectal bleed  Active Problems:  Atypical chest pain  Abdominal pain  Extrinsic asthma, unspecified  Current smoker  Nausea+ Vomiting +Diarrhea   38 y/o female with PMH of asthma, anemia presented with intermittent episodes of diffuse abdominal pain for two weeks associated with diarrhea, GIB   1. Abdominal pain of unclear etiology;  -bloody diarrhea; started empiric atx r/o infectious ethology; GI path, c diff to check; NPO,IVF; GI consult -r/o ischemia; will obtain CT angio abdomen;   2. Acute blood loss anemia with GIB; monitor Hg, TF prn   3. Atypical Chest Pain; resolved; Troponin's negative; Monitor  4. Asthma- stable, Recue Inhaler QID PRN.   Code Status: full Family Communication: d/w patient, her husband (indicate person spoken with, relationship, and if by phone, the number) Disposition Plan: home 24-48 hours    Consultants: GI  Procedures:  None   Antibiotics:  Cipro+flagyl 3/11<<<<   (indicate start date, and stop date if known)  HPI/Subjective: alert  Objective: Filed Vitals:   04/27/13 0454  BP: 130/74  Pulse: 74  Temp: 98.1 F (36.7 C)  Resp: 18    Intake/Output Summary (Last 24 hours) at 04/27/13 1141 Last data filed at 04/27/13 0900  Gross per 24 hour  Intake 1839.58 ml  Output      0 ml  Net 1839.58 ml   Filed Weights   04/27/13 0109  Weight: 106.369 kg (234 lb 8 oz)    Exam:   General:  alert  Cardiovascular: s1,s2 rrr  Respiratory: CTA BL  Abdomen: soft, mild diffuse tender, no rebound   Musculoskeletal: no LE edema   Data Reviewed: Basic Metabolic Panel:  Recent Labs Lab 04/25/13 1208 04/26/13 1900 04/27/13 0350  NA 139 140 140  K 4.3 3.8 3.7  CL 101 102 105  CO2 24 22 25   GLUCOSE 105* 109* 89  BUN 5* 6 7  CREATININE 0.59 0.71 0.76  CALCIUM 8.8 9.3 8.1*    Liver Function Tests:  Recent Labs Lab 04/25/13 1208 04/26/13 1900  AST 22 34  ALT 26 30  ALKPHOS 88 86  BILITOT 0.2* <0.2*  PROT 7.2 7.5  ALBUMIN 3.4* 3.2*    Recent Labs Lab 04/25/13 1208  LIPASE 27   No results found for this basename: AMMONIA,  in the last 168 hours CBC:  Recent Labs Lab 04/25/13 1208 04/26/13 1900 04/27/13 0350  WBC 6.7 6.7 6.6  NEUTROABS 4.0  --   --   HGB 12.3 12.4 10.3*  HCT 37.5 37.1 32.8*  MCV 84.8 84.1 86.5  PLT 511* 488* 402*   Cardiac Enzymes:  Recent Labs Lab 04/26/13 2206 04/27/13 0350 04/27/13 1035  TROPONINI <0.30 <0.30 <0.30   BNP (last 3 results) No results found for this basename: PROBNP,  in the last 8760 hours CBG: No results found for this basename: GLUCAP,  in the last 168 hours  No results found for this or any previous visit (from the past 240 hour(s)).   Studies: Dg Abd Acute W/chest  04/26/2013   CLINICAL DATA Abdominal pain.  EXAM ACUTE ABDOMEN SERIES (ABDOMEN 2 VIEW & CHEST 1 VIEW)  COMPARISON None.  FINDINGS There is no evidence of dilated bowel loops or free intraperitoneal air. No radiopaque calculi or other significant radiographic abnormality is seen. Status post cholecystectomy. Heart size and mediastinal contours are within normal limits. Both lungs are clear.  IMPRESSION  Negative abdominal radiographs.  No acute cardiopulmonary disease.  SIGNATURE  Electronically Signed   By: Sabino Dick M.D.   On: 04/26/2013 22:48    Scheduled Meds: . ARIPiprazole  20 mg Oral Daily  . pantoprazole (PROTONIX) IV  40 mg Intravenous Q12H   Continuous Infusions: . sodium chloride 75 mL/hr (04/27/13 0819)    Principal Problem:   Rectal bleed Active Problems:   Extrinsic asthma, unspecified   Current smoker   Atypical chest pain   Abdominal pain   Persistent vomiting    Time spent: >35 minutes     Kinnie Feil  [facility] Pager [phone]. If 7PM-7AM, please contact night-coverage at  [email or web address], password [facility] 04/27/2013, 11:41 AM  LOS: 1 day

## 2013-04-28 LAB — HEMOGLOBIN AND HEMATOCRIT, BLOOD
HCT: 32.6 % — ABNORMAL LOW (ref 36.0–46.0)
HCT: 34.3 % — ABNORMAL LOW (ref 36.0–46.0)
Hemoglobin: 10.5 g/dL — ABNORMAL LOW (ref 12.0–15.0)
Hemoglobin: 11.4 g/dL — ABNORMAL LOW (ref 12.0–15.0)

## 2013-04-28 MED ORDER — BISACODYL 5 MG PO TBEC
10.0000 mg | DELAYED_RELEASE_TABLET | Freq: Once | ORAL | Status: AC
  Administered 2013-04-28: 10 mg via ORAL
  Filled 2013-04-28 (×2): qty 2

## 2013-04-28 MED ORDER — PEG 3350-KCL-NA BICARB-NACL 420 G PO SOLR
2700.0000 mL | Freq: Once | ORAL | Status: AC
  Administered 2013-04-28: 2700 mL via ORAL

## 2013-04-28 MED ORDER — HYDROMORPHONE HCL PF 1 MG/ML IJ SOLN
0.5000 mg | INTRAMUSCULAR | Status: DC | PRN
  Administered 2013-04-28 – 2013-04-29 (×5): 1.5 mg via INTRAVENOUS
  Filled 2013-04-28 (×2): qty 2
  Filled 2013-04-28: qty 1
  Filled 2013-04-28 (×3): qty 2

## 2013-04-28 MED ORDER — PEG 3350-KCL-NA BICARB-NACL 420 G PO SOLR
1300.0000 mL | Freq: Once | ORAL | Status: AC
  Administered 2013-04-29: 1300 mL via ORAL

## 2013-04-28 MED ORDER — SODIUM CHLORIDE 0.9 % IV SOLN
INTRAVENOUS | Status: DC
  Administered 2013-04-28: 17:00:00 via INTRAVENOUS
  Administered 2013-04-29: 500 mL via INTRAVENOUS

## 2013-04-28 NOTE — Progress Notes (Signed)
Subjective: Abdominal pain improving.  Objective: Vital signs in last 24 hours: Temp:  [97.8 F (36.6 C)-98.2 F (36.8 C)] 97.8 F (36.6 C) (03/12 1416) Pulse Rate:  [63-83] 68 (03/12 1416) Resp:  [18-19] 18 (03/12 1416) BP: (109-133)/(57-91) 109/57 mmHg (03/12 1416) SpO2:  [96 %-99 %] 99 % (03/12 1416) Weight change:  Last BM Date: 04/26/13  PE: GEN:  NAD ABD:  Soft, mild abdominal tenderness (improved cf.  Yesterday), no peritonitis  Lab Results: CBC    Component Value Date/Time   WBC 6.6 04/27/2013 0350   RBC 3.79* 04/27/2013 0350   HGB 11.4* 04/28/2013 1224   HCT 34.3* 04/28/2013 1224   PLT 402* 04/27/2013 0350   MCV 86.5 04/27/2013 0350   MCH 27.2 04/27/2013 0350   MCHC 31.4 04/27/2013 0350   RDW 19.6* 04/27/2013 0350   LYMPHSABS 2.2 04/25/2013 1208   MONOABS 0.4 04/25/2013 1208   EOSABS 0.1 04/25/2013 1208   BASOSABS 0.0 04/25/2013 1208   CMP     Component Value Date/Time   NA 140 04/27/2013 0350   K 3.7 04/27/2013 0350   CL 105 04/27/2013 0350   CO2 25 04/27/2013 0350   GLUCOSE 89 04/27/2013 0350   BUN 7 04/27/2013 0350   CREATININE 0.76 04/27/2013 0350   CALCIUM 8.1* 04/27/2013 0350   PROT 7.5 04/26/2013 1900   ALBUMIN 3.2* 04/26/2013 1900   AST 34 04/26/2013 1900   ALT 30 04/26/2013 1900   ALKPHOS 86 04/26/2013 1900   BILITOT <0.2* 04/26/2013 1900   GFRNONAA >90 04/27/2013 0350   GFRAA >90 04/27/2013 0350   Studies/Results: Dg Abd Acute W/chest  04/26/2013   CLINICAL DATA Abdominal pain.  EXAM ACUTE ABDOMEN SERIES (ABDOMEN 2 VIEW & CHEST 1 VIEW)  COMPARISON None.  FINDINGS There is no evidence of dilated bowel loops or free intraperitoneal air. No radiopaque calculi or other significant radiographic abnormality is seen. Status post cholecystectomy. Heart size and mediastinal contours are within normal limits. Both lungs are clear.  IMPRESSION Negative abdominal radiographs.  No acute cardiopulmonary disease.  SIGNATURE  Electronically Signed   By: Sabino Dick M.D.   On:  04/26/2013 22:48   Ct Angio Abd/pel W/ And/or W/o  04/27/2013   CLINICAL DATA Persistent abdominal pain.  Question mesenteric ischemia.  EXAM CT ANGIOGRAPHY ABDOMEN AND PELVIS WITH CONTRAST AND WITHOUT CONTRAST  TECHNIQUE Multidetector CT imaging of the abdomen and pelvis was performed using the standard protocol during bolus administration of intravenous contrast. Multiplanar reconstructed images and MIPs were obtained and reviewed to evaluate the vascular anatomy.  CONTRAST 152mL OMNIPAQUE IOHEXOL 350 MG/ML SOLN  COMPARISON 04/18/2013  FINDINGS Images which include the lung bases show bibasilar atelectasis.  There is no abdominal aortic aneurysm. No appreciable atherosclerotic calcification in the wall of the abdominal aorta. The celiac axis is widely patent. There is normal branching of the celiac axis with no evidence for an apparent or replaced hepatic artery. Superior mesenteric artery is also widely patent. Inferior mesenteric artery is widely patent. Single renal arteries are present bilaterally with no evidence for proximal stenotic lesion.  The portal vein is patent. Superior mesenteric vein is patent. No evidence for portal venous gas.  The liver measures 23.6 cm in craniocaudal length. Diffusely low attenuation of the liver parenchyma is consistent with steatosis. No focal abnormality is seen in the liver or spleen.  The stomach, duodenum, pancreas, and adrenal glands are unremarkable. The kidneys have normal imaging features bilaterally.  No free fluid or lymphadenopathy  in the abdomen.  Imaging through the pelvis is degraded in the mid pelvis secondary to marked patient motion. Within this limitation, no free fluid is evident. There is no evidence for pelvic sidewall lymphadenopathy. Bladder is unremarkable. No adnexal mass. Uterus is surgically absent.  No colonic diverticulitis. Few scattered diverticular are seen in the left colon. Colon is decompressed. Terminal ileum is normal. The appendix is  normal.  Bone windows reveal no worrisome lytic or sclerotic osseous lesions.  Review of the MIP images confirms the above findings.  IMPRESSION No evidence for mesenteric arterial stenosis. Major arterial anatomy is widely patent. There is no evidence for thrombus within the portal venous system.  No small bowel wall thickening to suggest bowel ischemia.  Question very subtle pericolonic edema associated with the right colon. While not a definite finding by CT, right-sided colitis would be a possibility.  SIGNATURE  Electronically Signed   By: Misty Stanley M.D.   On: 04/27/2013 19:02   Assessment:  1.  Abdominal pain, improving. 2.  Blood in stool. 3.  CT scan with possible right-sided edema; no evidence of vascular occlusion. 4.  Anemia.  Plan:  1.  Clear liquids tonight. 2.  Colonoscopy tomorrow. 3.  Pending colonoscopy findings, might consider discharge home tomorrow if patient continues to improve.   Landry Dyke 04/28/2013, 4:05 PM

## 2013-04-28 NOTE — Progress Notes (Signed)
[facility] PROGRESS NOTE  Marisa [name] ZHY:[id number] DOB: 1975-09-24 DOA: 04/26/2013 PCP: Angelica Chessman, MD  Assessment/Plan: 38 y/o female with PMH of asthma, anemia presented with intermittent episodes of diffuse abdominal pain for two weeks associated with diarrhea, GIB   Abdominal pain of unclear etiology; - questionable right-sided colitis on the CT scan. No evidence of ischemia. - bloody diarrhea; started empiric atx r/o infectious ethology; GI path, c diff to check; NPO,IVF; GI consult - Appreciate GI consult for further evaluation. Acute blood loss anemia with GIB; monitor Hg, TF prn  Atypical Chest Pain; resolved; Troponin's negative; Monitor Asthma - stable, Recue Inhaler QID PRN.   Code Status: full Family Communication: d/w patient, her husband (indicate person spoken with, relationship, and if by phone, the number) Disposition Plan: home 24-48 hours   Consultants: GI  Procedures:  None   Antibiotics:  Cipro+flagyl 3/11<<<<  HPI/Subjective: alert  Objective: Filed Vitals:   04/28/13 1416  BP: 109/57  Pulse: 68  Temp: 97.8 F (36.6 C)  Resp: 18    Intake/Output Summary (Last 24 hours) at 04/28/13 1505 Last data filed at 04/28/13 1300  Gross per 24 hour  Intake   2200 ml  Output      1 ml  Net   2199 ml   Filed Weights   04/27/13 0109  Weight: 106.369 kg (234 lb 8 oz)    Exam:   General:  alert  Cardiovascular: s1,s2 rrr  Respiratory: CTA BL  Abdomen: soft, mild diffuse tender, no rebound   Musculoskeletal: no LE edema   Data Reviewed: Basic Metabolic Panel:  Recent Labs Lab 04/25/13 1208 04/26/13 1900 04/27/13 0350  NA 139 140 140  K 4.3 3.8 3.7  CL 101 102 105  CO2 24 22 25   GLUCOSE 105* 109* 89  BUN 5* 6 7  CREATININE 0.59 0.71 0.76  CALCIUM 8.8 9.3 8.1*   Liver Function Tests:  Recent Labs Lab 04/25/13 1208 04/26/13 1900  AST 22 34  ALT 26 30  ALKPHOS 88 86  BILITOT 0.2* <0.2*  PROT 7.2 7.5   ALBUMIN 3.4* 3.2*    Recent Labs Lab 04/25/13 1208  LIPASE 27   CBC:  Recent Labs Lab 04/25/13 1208 04/26/13 1900 04/27/13 0350 04/27/13 1303 04/27/13 1947 04/28/13 0400 04/28/13 1224  WBC 6.7 6.7 6.6  --   --   --   --   NEUTROABS 4.0  --   --   --   --   --   --   HGB 12.3 12.4 10.3* 10.8* 10.7* 10.5* 11.4*  HCT 37.5 37.1 32.8* 33.5* 32.8* 32.6* 34.3*  MCV 84.8 84.1 86.5  --   --   --   --   PLT 511* 488* 402*  --   --   --   --    Cardiac Enzymes:  Recent Labs Lab 04/26/13 2206 04/27/13 0350 04/27/13 1035  TROPONINI <0.30 <0.30 <0.30   Studies: Dg Abd Acute W/chest  04/26/2013   CLINICAL DATA Abdominal pain.  EXAM ACUTE ABDOMEN SERIES (ABDOMEN 2 VIEW & CHEST 1 VIEW)  COMPARISON None.  FINDINGS There is no evidence of dilated bowel loops or free intraperitoneal air. No radiopaque calculi or other significant radiographic abnormality is seen. Status post cholecystectomy. Heart size and mediastinal contours are within normal limits. Both lungs are clear.  IMPRESSION Negative abdominal radiographs.  No acute cardiopulmonary disease.  SIGNATURE  Electronically Signed   By: Sabino Dick M.D.   On: 04/26/2013  22:48   Ct Angio Abd/pel W/ And/or W/o  04/27/2013   CLINICAL DATA Persistent abdominal pain.  Question mesenteric ischemia.  EXAM CT ANGIOGRAPHY ABDOMEN AND PELVIS WITH CONTRAST AND WITHOUT CONTRAST  TECHNIQUE Multidetector CT imaging of the abdomen and pelvis was performed using the standard protocol during bolus administration of intravenous contrast. Multiplanar reconstructed images and MIPs were obtained and reviewed to evaluate the vascular anatomy.  CONTRAST 16mL OMNIPAQUE IOHEXOL 350 MG/ML SOLN  COMPARISON 04/18/2013  FINDINGS Images which include the lung bases show bibasilar atelectasis.  There is no abdominal aortic aneurysm. No appreciable atherosclerotic calcification in the wall of the abdominal aorta. The celiac axis is widely patent. There is normal  branching of the celiac axis with no evidence for an apparent or replaced hepatic artery. Superior mesenteric artery is also widely patent. Inferior mesenteric artery is widely patent. Single renal arteries are present bilaterally with no evidence for proximal stenotic lesion.  The portal vein is patent. Superior mesenteric vein is patent. No evidence for portal venous gas.  The liver measures 23.6 cm in craniocaudal length. Diffusely low attenuation of the liver parenchyma is consistent with steatosis. No focal abnormality is seen in the liver or spleen.  The stomach, duodenum, pancreas, and adrenal glands are unremarkable. The kidneys have normal imaging features bilaterally.  No free fluid or lymphadenopathy in the abdomen.  Imaging through the pelvis is degraded in the mid pelvis secondary to marked patient motion. Within this limitation, no free fluid is evident. There is no evidence for pelvic sidewall lymphadenopathy. Bladder is unremarkable. No adnexal mass. Uterus is surgically absent.  No colonic diverticulitis. Few scattered diverticular are seen in the left colon. Colon is decompressed. Terminal ileum is normal. The appendix is normal.  Bone windows reveal no worrisome lytic or sclerotic osseous lesions.  Review of the MIP images confirms the above findings.  IMPRESSION No evidence for mesenteric arterial stenosis. Major arterial anatomy is widely patent. There is no evidence for thrombus within the portal venous system.  No small bowel wall thickening to suggest bowel ischemia.  Question very subtle pericolonic edema associated with the right colon. While not a definite finding by CT, right-sided colitis would be a possibility.  SIGNATURE  Electronically Signed   By: Misty Stanley M.D.   On: 04/27/2013 19:02   Scheduled Meds: . ARIPiprazole  20 mg Oral Daily  . ciprofloxacin  400 mg Intravenous Q12H  . metronidazole  500 mg Intravenous Q8H  . pantoprazole (PROTONIX) IV  40 mg Intravenous Q12H    Continuous Infusions: . sodium chloride 75 mL/hr at 04/28/13 0032   Principal Problem:   Rectal bleed Active Problems:   Extrinsic asthma, unspecified   Current smoker   Atypical chest pain   Abdominal pain   Persistent vomiting  Time spent: 25 minutes   [place] Pager [phone]. If 7PM-7AM, please contact night-coverage at [email or web address], password [facility] 04/28/2013, 3:05 PM  LOS: 2 days

## 2013-04-29 ENCOUNTER — Encounter (HOSPITAL_COMMUNITY)
Admission: EM | Disposition: A | Discharge status: Home / Self Care | Payer: Self-pay | Source: Home / Self Care | Attending: Internal Medicine

## 2013-04-29 ENCOUNTER — Encounter (HOSPITAL_COMMUNITY): Payer: Self-pay | Admitting: *Deleted

## 2013-04-29 HISTORY — PX: COLONOSCOPY: SHX5424

## 2013-04-29 LAB — HEMOGLOBIN AND HEMATOCRIT, BLOOD
HCT: 33 % — ABNORMAL LOW (ref 36.0–46.0)
Hemoglobin: 10.6 g/dL — ABNORMAL LOW (ref 12.0–15.0)

## 2013-04-29 SURGERY — COLONOSCOPY
Anesthesia: Moderate Sedation | Laterality: Left

## 2013-04-29 SURGERY — COLONOSCOPY
Anesthesia: Moderate Sedation

## 2013-04-29 MED ORDER — FENTANYL CITRATE 0.05 MG/ML IJ SOLN
INTRAMUSCULAR | Status: AC
  Filled 2013-04-29: qty 2

## 2013-04-29 MED ORDER — METRONIDAZOLE 500 MG PO TABS: 500.0000 mg | ORAL_TABLET | Freq: Three times a day (TID) | ORAL | Status: DC

## 2013-04-29 MED ORDER — ONDANSETRON HCL 4 MG PO TABS: 4.0000 mg | ORAL_TABLET | Freq: Four times a day (QID) | ORAL | Status: DC

## 2013-04-29 MED ORDER — CIPROFLOXACIN HCL 500 MG PO TABS
500.0000 mg | ORAL_TABLET | Freq: Two times a day (BID) | ORAL | Status: DC
  Filled 2013-04-29 (×2): qty 1

## 2013-04-29 MED ORDER — MIDAZOLAM HCL 10 MG/2ML IJ SOLN
INTRAMUSCULAR | Status: DC | PRN
  Administered 2013-04-29 (×2): 2 mg via INTRAVENOUS
  Administered 2013-04-29 (×2): 1 mg via INTRAVENOUS

## 2013-04-29 MED ORDER — METRONIDAZOLE 500 MG PO TABS
500.0000 mg | ORAL_TABLET | Freq: Three times a day (TID) | ORAL | Status: DC
  Administered 2013-04-29: 500 mg via ORAL
  Filled 2013-04-29 (×3): qty 1

## 2013-04-29 MED ORDER — MIDAZOLAM HCL 10 MG/2ML IJ SOLN
INTRAMUSCULAR | Status: AC
  Filled 2013-04-29: qty 2

## 2013-04-29 MED ORDER — DIPHENHYDRAMINE HCL 50 MG/ML IJ SOLN
INTRAMUSCULAR | Status: DC | PRN
  Administered 2013-04-29: 25 mg via INTRAVENOUS

## 2013-04-29 MED ORDER — HYDROCODONE-ACETAMINOPHEN 5-325 MG PO TABS: 1.0000 | ORAL_TABLET | Freq: Four times a day (QID) | ORAL | Status: DC | PRN

## 2013-04-29 MED ORDER — DIPHENHYDRAMINE HCL 50 MG/ML IJ SOLN
INTRAMUSCULAR | Status: AC
  Filled 2013-04-29: qty 1

## 2013-04-29 MED ORDER — CIPROFLOXACIN HCL 500 MG PO TABS: 500.0000 mg | ORAL_TABLET | Freq: Two times a day (BID) | ORAL | Status: DC

## 2013-04-29 MED ORDER — FENTANYL CITRATE 0.05 MG/ML IJ SOLN
INTRAMUSCULAR | Status: DC | PRN
  Administered 2013-04-29 (×2): 25 ug via INTRAVENOUS

## 2013-04-29 NOTE — ED Provider Notes (Signed)
Medical screening examination/treatment/procedure(s) were performed by non-physician practitioner and as supervising physician I was immediately available for consultation/collaboration.   EKG Interpretation None       Richarda Blade, MD 04/29/13 [phone]

## 2013-04-29 NOTE — H&P (View-Only) (Signed)
[facility] PROGRESS NOTE  Marisa [name] MRN:[id number] DOB: 07/20/1975 DOA: 04/26/2013 PCP: JEGEDE, OLUGBEMIGA, MD  Assessment/Plan: 38 y/o female with PMH of asthma, anemia presented with intermittent episodes of diffuse abdominal pain for two weeks associated with diarrhea, GIB   Abdominal pain of unclear etiology; - questionable right-sided colitis on the CT scan. No evidence of ischemia. - bloody diarrhea; started empiric atx r/o infectious ethology; GI path, c diff to check; NPO,IVF; GI consult - Appreciate GI consult for further evaluation. Acute blood loss anemia with GIB; monitor Hg, TF prn  Atypical Chest Pain; resolved; Troponin's negative; Monitor Asthma - stable, Recue Inhaler QID PRN.   Code Status: full Family Communication: d/w patient, her husband (indicate person spoken with, relationship, and if by phone, the number) Disposition Plan: home 24-48 hours   Consultants: GI  Procedures:  None   Antibiotics:  Cipro+flagyl 3/11<<<<  HPI/Subjective: alert  Objective: Filed Vitals:   04/28/13 1416  BP: 109/57  Pulse: 68  Temp: 97.8 F (36.6 C)  Resp: 18    Intake/Output Summary (Last 24 hours) at 04/28/13 1505 Last data filed at 04/28/13 1300  Gross per 24 hour  Intake   2200 ml  Output      1 ml  Net   2199 ml   Filed Weights   04/27/13 0109  Weight: 106.369 kg (234 lb 8 oz)    Exam:   General:  alert  Cardiovascular: s1,s2 rrr  Respiratory: CTA BL  Abdomen: soft, mild diffuse tender, no rebound   Musculoskeletal: no LE edema   Data Reviewed: Basic Metabolic Panel:  Recent Labs Lab 04/25/13 1208 04/26/13 1900 04/27/13 0350  NA 139 140 140  K 4.3 3.8 3.7  CL 101 102 105  CO2 24 22 25  GLUCOSE 105* 109* 89  BUN 5* 6 7  CREATININE 0.59 0.71 0.76  CALCIUM 8.8 9.3 8.1*   Liver Function Tests:  Recent Labs Lab 04/25/13 1208 04/26/13 1900  AST 22 34  ALT 26 30  ALKPHOS 88 86  BILITOT 0.2* <0.2*  PROT 7.2 7.5   ALBUMIN 3.4* 3.2*    Recent Labs Lab 04/25/13 1208  LIPASE 27   CBC:  Recent Labs Lab 04/25/13 1208 04/26/13 1900 04/27/13 0350 04/27/13 1303 04/27/13 1947 04/28/13 0400 04/28/13 1224  WBC 6.7 6.7 6.6  --   --   --   --   NEUTROABS 4.0  --   --   --   --   --   --   HGB 12.3 12.4 10.3* 10.8* 10.7* 10.5* 11.4*  HCT 37.5 37.1 32.8* 33.5* 32.8* 32.6* 34.3*  MCV 84.8 84.1 86.5  --   --   --   --   PLT 511* 488* 402*  --   --   --   --    Cardiac Enzymes:  Recent Labs Lab 04/26/13 2206 04/27/13 0350 04/27/13 1035  TROPONINI <0.30 <0.30 <0.30   Studies: Dg Abd Acute W/chest  04/26/2013   CLINICAL DATA Abdominal pain.  EXAM ACUTE ABDOMEN SERIES (ABDOMEN 2 VIEW & CHEST 1 VIEW)  COMPARISON None.  FINDINGS There is no evidence of dilated bowel loops or free intraperitoneal air. No radiopaque calculi or other significant radiographic abnormality is seen. Status post cholecystectomy. Heart size and mediastinal contours are within normal limits. Both lungs are clear.  IMPRESSION Negative abdominal radiographs.  No acute cardiopulmonary disease.  SIGNATURE  Electronically Signed   By: James  Green M.D.   On: 04/26/2013   22:48   Ct Angio Abd/pel W/ And/or W/o  04/27/2013   CLINICAL DATA Persistent abdominal pain.  Question mesenteric ischemia.  EXAM CT ANGIOGRAPHY ABDOMEN AND PELVIS WITH CONTRAST AND WITHOUT CONTRAST  TECHNIQUE Multidetector CT imaging of the abdomen and pelvis was performed using the standard protocol during bolus administration of intravenous contrast. Multiplanar reconstructed images and MIPs were obtained and reviewed to evaluate the vascular anatomy.  CONTRAST 16mL OMNIPAQUE IOHEXOL 350 MG/ML SOLN  COMPARISON 04/18/2013  FINDINGS Images which include the lung bases show bibasilar atelectasis.  There is no abdominal aortic aneurysm. No appreciable atherosclerotic calcification in the wall of the abdominal aorta. The celiac axis is widely patent. There is normal  branching of the celiac axis with no evidence for an apparent or replaced hepatic artery. Superior mesenteric artery is also widely patent. Inferior mesenteric artery is widely patent. Single renal arteries are present bilaterally with no evidence for proximal stenotic lesion.  The portal vein is patent. Superior mesenteric vein is patent. No evidence for portal venous gas.  The liver measures 23.6 cm in craniocaudal length. Diffusely low attenuation of the liver parenchyma is consistent with steatosis. No focal abnormality is seen in the liver or spleen.  The stomach, duodenum, pancreas, and adrenal glands are unremarkable. The kidneys have normal imaging features bilaterally.  No free fluid or lymphadenopathy in the abdomen.  Imaging through the pelvis is degraded in the mid pelvis secondary to marked patient motion. Within this limitation, no free fluid is evident. There is no evidence for pelvic sidewall lymphadenopathy. Bladder is unremarkable. No adnexal mass. Uterus is surgically absent.  No colonic diverticulitis. Few scattered diverticular are seen in the left colon. Colon is decompressed. Terminal ileum is normal. The appendix is normal.  Bone windows reveal no worrisome lytic or sclerotic osseous lesions.  Review of the MIP images confirms the above findings.  IMPRESSION No evidence for mesenteric arterial stenosis. Major arterial anatomy is widely patent. There is no evidence for thrombus within the portal venous system.  No small bowel wall thickening to suggest bowel ischemia.  Question very subtle pericolonic edema associated with the right colon. While not a definite finding by CT, right-sided colitis would be a possibility.  SIGNATURE  Electronically Signed   By: Misty Stanley M.D.   On: 04/27/2013 19:02   Scheduled Meds: . ARIPiprazole  20 mg Oral Daily  . ciprofloxacin  400 mg Intravenous Q12H  . metronidazole  500 mg Intravenous Q8H  . pantoprazole (PROTONIX) IV  40 mg Intravenous Q12H    Continuous Infusions: . sodium chloride 75 mL/hr at 04/28/13 0032   Principal Problem:   Rectal bleed Active Problems:   Extrinsic asthma, unspecified   Current smoker   Atypical chest pain   Abdominal pain   Persistent vomiting  Time spent: 25 minutes   [place], [facility] Pager [phone]. If 7PM-7AM, please contact night-coverage at [email or web address], password [facility] 04/28/2013, 3:05 PM  LOS: 2 days

## 2013-04-29 NOTE — Interval H&P Note (Signed)
History and Physical Interval Note:  04/29/2013 11:21 AM  Marisa [name]  has presented today for surgery, with the diagnosis of abdominal pain, blood in stool, anemia  The various methods of treatment have been discussed with the patient and family. After consideration of risks, benefits and other options for treatment, the patient has consented to  Procedure(s): COLONOSCOPY (Left) as a surgical intervention .  The patient's history has been reviewed, patient examined, no change in status, stable for surgery.  I have reviewed the patient's chart and labs.  Questions were answered to the patient's satisfaction.     Lurae Hornbrook M  Assessment:  1.  Abdominal pain. 2.  Blood in stool. 3.  Anemia. 4.  Possible right-sided colitis on CT scan.  Plan:  1.  Colonoscopy. 2.  Risks (bleeding, infection, bowel perforation that could require surgery, sedation-related changes in cardiopulmonary systems), benefits (identification and possible treatment of source of symptoms, exclusion of certain causes of symptoms), and alternatives (watchful waiting, radiographic imaging studies, empiric medical treatment) of colonoscopy were explained to patient/family in detail and patient wishes to proceed.

## 2013-04-29 NOTE — Discharge Summary (Signed)
Physician Discharge Summary  [name] KVQ:[id number] DOB: January 22, 1976 DOA: 04/26/2013  PCP: Angelica Chessman, MD  Admit date: 04/26/2013 Discharge date: 04/29/2013  Time spent: 35 minutes  Recommendations for Outpatient Follow-up:  1. Followup with primary care provider in one to 2 weeks 2. Followup with GI as an outpatient in one to 2 weeks   Discharge Diagnoses:  Principal Problem:   Rectal bleed Active Problems:   Extrinsic asthma, unspecified   Current smoker   Atypical chest pain   Abdominal pain   Persistent vomiting  Discharge Condition: stable  Diet recommendation: regular  Filed Weights   04/27/13 0109  Weight: 106.369 kg (234 lb 8 oz)   History of present illness:  Marisa [name] is a 38 y.o. female who presents to the ED with complaints with 1 episode of rectal bleeding , passing BRBPR this afternoon. The bleeding was noticed right after she had an episode of diarrhea, and when she wiped she saw the bright red blood. She also reports having diffuse ABD pain associated with the episode. She also had chest tightness when it occurred. She has had persistent nausea and vomiting and diarrhea over the past 2 weeks, and has been seen in the ED twice on 03/02 and on 03/09 . She had a CT scan of her ABD performed on the 03/02 visit on which was negative for findings of colitis.   Hospital Course:  Abdominal pain of unclear etiology; - questionable right-sided colitis on the CT scan. No evidence of ischemia. Patient's abdominal pain has slowly improved on antibiotics. Gastroenterology has been consulted and have followed patient while she was hospitalized. Given the negative CT scan for ischemia, patient underwent a colonoscopy on 04/29/2013 which was negative for a clear source of her abdominal pain or evidence of colitis. She did have a polyp which was removed and biopsies pending at the time of discharge. Following the colonoscopy, patient was able to tolerate a  regular diet, pain was controlled on oral pain medications, and she will be discharged home in stable condition to continue oral antibiotics. FOBT was positive, however patient without overt bleeding and without any obvious GI sources. Hemoglobin has remained stable throughout hospitalization. Recommend a repeat CBC at her next visit her primary care provider. Atypical Chest Pain; resolved; Troponin's negative Asthma - stable, Recue Inhaler QID PRN.   Procedures:  Colonoscopy  No source of abdominal pain seen. No colitis or other explanation of right colonic edema was identified. Blood in stool might possibly be hemorrhoidal, but does not appear to be ongoing.  Consultations:  Gastroenterology  Discharge Exam: Filed Vitals:   04/29/13 1154 04/29/13 1155 04/29/13 1200 04/29/13 1210  BP: 129/93 129/93 130/75 122/89  Pulse: 70 78 64   Temp:      TempSrc:      Resp: 16 28 23 17   Height:      Weight:      SpO2: 100% 100% 100% 100%    General: No acute distress Cardiovascular: Regular rate and rhythm Respiratory: Clear to auscultation bilaterally  Discharge Instructions     Medication List         albuterol 108 (90 BASE) MCG/ACT inhaler  Commonly known as:  PROVENTIL HFA;VENTOLIN HFA  Inhale 1-2 puffs into the lungs every 6 (six) hours as needed for wheezing or shortness of breath.     ARIPiprazole 20 MG tablet  Commonly known as:  ABILIFY  Take 20 mg by mouth daily.     ciprofloxacin  500 MG tablet  Commonly known as:  CIPRO  Take 1 tablet (500 mg total) by mouth every 12 (twelve) hours.     HYDROcodone-acetaminophen 5-325 MG per tablet  Commonly known as:  NORCO/VICODIN  Take 1 tablet by mouth every 6 (six) hours as needed (pain.).     ibuprofen 800 MG tablet  Commonly known as:  ADVIL,MOTRIN  Take 1 tablet (800 mg total) by mouth 3 (three) times daily.     metroNIDAZOLE 500 MG tablet  Commonly known as:  FLAGYL  Take 1 tablet (500 mg total) by mouth every 8  (eight) hours.     ondansetron 4 MG tablet  Commonly known as:  ZOFRAN  Take 1 tablet (4 mg total) by mouth every 6 (six) hours.           Follow-up Information   Follow up with Angelica Chessman, MD. Schedule an appointment as soon as possible for a visit in 2 weeks.   Specialty:  Internal Medicine   Contact information:   [place] [address] [phone]       Follow up with Landry Dyke, MD. Schedule an appointment as soon as possible for a visit in 2 weeks.   Specialty:  Gastroenterology   Contact information:   [address] [place] [address] [phone]       The results of significant diagnostics from this hospitalization (including imaging, microbiology, ancillary and laboratory) are listed below for reference.    Significant Diagnostic Studies: Ct Abdomen Pelvis W Contrast  04/18/2013   CLINICAL DATA:  Abdominal pain.  Headache and vomiting.  EXAM: CT ABDOMEN AND PELVIS WITH CONTRAST  TECHNIQUE: Multidetector CT imaging of the abdomen and pelvis was performed using the standard protocol following bolus administration of intravenous contrast.  CONTRAST:  64mL OMNIPAQUE IOHEXOL 300 MG/ML SOLN, 162mL OMNIPAQUE IOHEXOL 300 MG/ML SOLN  COMPARISON:  DG ABD ACUTE W/CHEST dated 04/18/2013; CT ABD/PELVIS W CM dated 01/17/2013; CT ABD/PELVIS W CM dated 06/22/2012  FINDINGS: Diffuse hepatic steatosis. The spleen, pancreas, and adrenal glands appear normal. Gallbladder surgically absent.  Kidneys and proximal ureters unremarkable. No pathologic upper abdominal adenopathy is observed. No pathologic pelvic adenopathy is observed. Appendix unremarkable.  Uterus surgically absent. Ovaries unremarkable. No dilated bowel observed.  IMPRESSION: 1. Diffuse hepatic steatosis. 2. No acute findings.   Electronically Signed   By: Sherryl Barters M.D.   On: 04/18/2013 10:55   Dg Abd Acute W/chest  04/26/2013   CLINICAL DATA Abdominal pain.  EXAM ACUTE ABDOMEN  SERIES (ABDOMEN 2 VIEW & CHEST 1 VIEW)  COMPARISON None.  FINDINGS There is no evidence of dilated bowel loops or free intraperitoneal air. No radiopaque calculi or other significant radiographic abnormality is seen. Status post cholecystectomy. Heart size and mediastinal contours are within normal limits. Both lungs are clear.  IMPRESSION Negative abdominal radiographs.  No acute cardiopulmonary disease.  SIGNATURE  Electronically Signed   By: Sabino Dick M.D.   On: 04/26/2013 22:48   Dg Abd Acute W/chest  04/18/2013   CLINICAL DATA:  Right-sided abdominal pain  EXAM: ACUTE ABDOMEN SERIES (ABDOMEN 2 VIEW & CHEST 1 VIEW)  COMPARISON:  None.  FINDINGS: There is no evidence of dilated bowel loops or free intraperitoneal air. Surgical clips in the gallbladder fossa. No radiopaque calculi or other significant radiographic abnormality is seen. Heart size and mediastinal contours are within normal limits. Both lungs are clear.  IMPRESSION: Negative abdominal radiographs.  No acute cardiopulmonary  disease.   Electronically Signed   By: Kathreen Devoid   On: 04/18/2013 09:41   Ct Angio Abd/pel W/ And/or W/o  04/27/2013   CLINICAL DATA Persistent abdominal pain.  Question mesenteric ischemia.  EXAM CT ANGIOGRAPHY ABDOMEN AND PELVIS WITH CONTRAST AND WITHOUT CONTRAST  TECHNIQUE Multidetector CT imaging of the abdomen and pelvis was performed using the standard protocol during bolus administration of intravenous contrast. Multiplanar reconstructed images and MIPs were obtained and reviewed to evaluate the vascular anatomy.  CONTRAST 180mL OMNIPAQUE IOHEXOL 350 MG/ML SOLN  COMPARISON 04/18/2013  FINDINGS Images which include the lung bases show bibasilar atelectasis.  There is no abdominal aortic aneurysm. No appreciable atherosclerotic calcification in the wall of the abdominal aorta. The celiac axis is widely patent. There is normal branching of the celiac axis with no evidence for an apparent or replaced hepatic artery.  Superior mesenteric artery is also widely patent. Inferior mesenteric artery is widely patent. Single renal arteries are present bilaterally with no evidence for proximal stenotic lesion.  The portal vein is patent. Superior mesenteric vein is patent. No evidence for portal venous gas.  The liver measures 23.6 cm in craniocaudal length. Diffusely low attenuation of the liver parenchyma is consistent with steatosis. No focal abnormality is seen in the liver or spleen.  The stomach, duodenum, pancreas, and adrenal glands are unremarkable. The kidneys have normal imaging features bilaterally.  No free fluid or lymphadenopathy in the abdomen.  Imaging through the pelvis is degraded in the mid pelvis secondary to marked patient motion. Within this limitation, no free fluid is evident. There is no evidence for pelvic sidewall lymphadenopathy. Bladder is unremarkable. No adnexal mass. Uterus is surgically absent.  No colonic diverticulitis. Few scattered diverticular are seen in the left colon. Colon is decompressed. Terminal ileum is normal. The appendix is normal.  Bone windows reveal no worrisome lytic or sclerotic osseous lesions.  Review of the MIP images confirms the above findings.  IMPRESSION No evidence for mesenteric arterial stenosis. Major arterial anatomy is widely patent. There is no evidence for thrombus within the portal venous system.  No small bowel wall thickening to suggest bowel ischemia.  Question very subtle pericolonic edema associated with the right colon. While not a definite finding by CT, right-sided colitis would be a possibility.  SIGNATURE  Electronically Signed   By: Misty Stanley M.D.   On: 04/27/2013 19:02   Labs: Basic Metabolic Panel:  Recent Labs Lab 04/25/13 1208 04/26/13 1900 04/27/13 0350  NA 139 140 140  K 4.3 3.8 3.7  CL 101 102 105  CO2 24 22 25   GLUCOSE 105* 109* 89  BUN 5* 6 7  CREATININE 0.59 0.71 0.76  CALCIUM 8.8 9.3 8.1*   Liver Function Tests:  Recent  Labs Lab 04/25/13 1208 04/26/13 1900  AST 22 34  ALT 26 30  ALKPHOS 88 86  BILITOT 0.2* <0.2*  PROT 7.2 7.5  ALBUMIN 3.4* 3.2*    Recent Labs Lab 04/25/13 1208  LIPASE 27   CBC:  Recent Labs Lab 04/25/13 1208 04/26/13 1900 04/27/13 0350 04/27/13 1303 04/27/13 1947 04/28/13 0400 04/28/13 1224 04/29/13 0353  WBC 6.7 6.7 6.6  --   --   --   --   --   NEUTROABS 4.0  --   --   --   --   --   --   --   HGB 12.3 12.4 10.3* 10.8* 10.7* 10.5* 11.4* 10.6*  HCT 37.5 37.1 32.8* 33.5*  32.8* 32.6* 34.3* 33.0*  MCV 84.8 84.1 86.5  --   --   --   --   --   PLT 511* 488* 402*  --   --   --   --   --    Cardiac Enzymes:  Recent Labs Lab 04/26/13 2206 04/27/13 0350 04/27/13 1035  TROPONINI <0.30 <0.30 <0.30   Signed:  Marzetta Board  [facility] 04/29/2013, 5:24 PM

## 2013-04-29 NOTE — Progress Notes (Signed)
Discharge to home, d/c instructions done and follow up appointments done and was given to the patient. PIV removed no s/s of infiltration or swelling noted on insertion site.

## 2013-04-29 NOTE — Op Note (Signed)
[facility] [place], [address]   COLONOSCOPY PROCEDURE REPORT  PATIENT: Marisa, [name]  MR#: [id number] BIRTHDATE: 08-04-1975 , 37  yrs. old GENDER: Female ENDOSCOPIST: Arta Silence, MD REFERRED QQ:[facility] PROCEDURE DATE:  04/29/2013 PROCEDURE:   Colonoscopy with snare polypectomy ASA CLASS:   Class II INDICATIONS:abdominal pain, blood in stool, anemia, CT scan with subtle right colonic edema. MEDICATIONS: Fentanyl 50 mcg IV, Versed 6 mg IV, and Benadryl 25 mg IV DESCRIPTION OF PROCEDURE:   After the risks benefits and alternatives of the procedure were thoroughly explained, informed consent was obtained.  A digital rectal exam revealed no abnormalities of the rectum.   The     endoscope was introduced through the anus and advanced to the terminal ileum which was intubated for a short distance. No adverse events experienced. The quality of the prep was good.  The instrument was then slowly withdrawn as the colon was fully examined.  Findings:  Normal digital rectal exam.  Prep quality was good.  Mild diffuse universal diverticulosis.  75mm transverse colon polyp, removed with hot snare.  No other polyps, masses, vascular ectasias, or inflammatory changes were seen.  Normal distal 5cm of the terminal ileum.  No old or fresh blood was seen to the extent of our examination.   Retroflexed views of rectum showed mild internal hemorrhoids, otherwise normal.        .  The scope was withdrawn and the procedure completed.  ENDOSCOPIC IMPRESSION:     As above.  No source of abdominal pain seen.  No colitis or other explanation of right colonic edema was identified.  Blood in stool might possibly be hemorrhoidal, but does not appear to be ongoing.  RECOMMENDATIONS:     1.  Watch for potential complications of procedure. 2.  High fiber diet for diverticulosis. 3.  Await polypectomy results; repeat colonoscopy in 5 years (if adenomatous)  or age 60 (if not adenomatous). 4.  Advance diet as tolerated. 5.  Hopefully home today or tomorrow; no further inpatient GI evaluation is planned; patient can follow-up with me (Dr. Paulita Fujita, [facility], [phone]) as outpatient for ongoing management of abdominal pain symptoms.  eSigned:  Arta Silence, MD 04/29/2013 12:03 PM cc:

## 2013-05-02 ENCOUNTER — Encounter (HOSPITAL_COMMUNITY): Payer: Self-pay | Admitting: Gastroenterology

## 2013-05-09 NOTE — ED Provider Notes (Signed)
I saw and evaluated the patient, reviewed the resident's note and I agree with the findings and plan.   .Face to face Exam:  General:  Awake HEENT:  Atraumatic Resp:  Normal effort Abd:  Nondistended Neuro:No focal weakness   Dot Lanes, MD 05/09/13 [phone]

## 2013-07-04 ENCOUNTER — Emergency Department (HOSPITAL_COMMUNITY): Payer: No Typology Code available for payment source

## 2013-07-04 ENCOUNTER — Emergency Department (HOSPITAL_COMMUNITY)
Admission: EM | Admit: 2013-07-04 | Discharge: 2013-07-04 | Disposition: A | Discharge status: Home / Self Care | Payer: No Typology Code available for payment source | Attending: Emergency Medicine | Admitting: Emergency Medicine

## 2013-07-04 ENCOUNTER — Encounter (HOSPITAL_COMMUNITY): Payer: Self-pay | Admitting: Emergency Medicine

## 2013-07-04 DIAGNOSIS — Z9089 Acquired absence of other organs: Secondary | ICD-10-CM | POA: Insufficient documentation

## 2013-07-04 DIAGNOSIS — N83201 Unspecified ovarian cyst, right side: Secondary | ICD-10-CM

## 2013-07-04 DIAGNOSIS — Z862 Personal history of diseases of the blood and blood-forming organs and certain disorders involving the immune mechanism: Secondary | ICD-10-CM | POA: Insufficient documentation

## 2013-07-04 DIAGNOSIS — Z90710 Acquired absence of both cervix and uterus: Secondary | ICD-10-CM | POA: Insufficient documentation

## 2013-07-04 DIAGNOSIS — Z880 Allergy status to penicillin: Secondary | ICD-10-CM | POA: Insufficient documentation

## 2013-07-04 DIAGNOSIS — J45909 Unspecified asthma, uncomplicated: Secondary | ICD-10-CM | POA: Insufficient documentation

## 2013-07-04 DIAGNOSIS — F17200 Nicotine dependence, unspecified, uncomplicated: Secondary | ICD-10-CM | POA: Insufficient documentation

## 2013-07-04 DIAGNOSIS — Z9889 Other specified postprocedural states: Secondary | ICD-10-CM | POA: Insufficient documentation

## 2013-07-04 DIAGNOSIS — Z79899 Other long term (current) drug therapy: Secondary | ICD-10-CM | POA: Insufficient documentation

## 2013-07-04 DIAGNOSIS — G8929 Other chronic pain: Secondary | ICD-10-CM | POA: Insufficient documentation

## 2013-07-04 DIAGNOSIS — Z8674 Personal history of sudden cardiac arrest: Secondary | ICD-10-CM | POA: Insufficient documentation

## 2013-07-04 DIAGNOSIS — N83209 Unspecified ovarian cyst, unspecified side: Secondary | ICD-10-CM | POA: Insufficient documentation

## 2013-07-04 DIAGNOSIS — Z8669 Personal history of other diseases of the nervous system and sense organs: Secondary | ICD-10-CM | POA: Insufficient documentation

## 2013-07-04 LAB — COMPREHENSIVE METABOLIC PANEL
ALT: 54 U/L — ABNORMAL HIGH (ref 0–35)
AST: 36 U/L (ref 0–37)
Albumin: 3 g/dL — ABNORMAL LOW (ref 3.5–5.2)
Alkaline Phosphatase: 65 U/L (ref 39–117)
BUN: 8 mg/dL (ref 6–23)
CO2: 23 mEq/L (ref 19–32)
Calcium: 9.8 mg/dL (ref 8.4–10.5)
Chloride: 101 mEq/L (ref 96–112)
Creatinine, Ser: 0.56 mg/dL (ref 0.50–1.10)
GFR calc Af Amer: >90 mL/min (ref >90)
GFR calc non Af Amer: >90 mL/min (ref >90)
Glucose, Bld: 117 mg/dL — ABNORMAL HIGH (ref 70–99)
Potassium: 5.7 mEq/L — ABNORMAL HIGH (ref 3.7–5.3)
Sodium: 136 mEq/L — ABNORMAL LOW (ref 137–147)
Total Bilirubin: 0.2 mg/dL — ABNORMAL LOW (ref 0.3–1.2)
Total Protein: 6.6 g/dL (ref 6.0–8.3)

## 2013-07-04 LAB — URINALYSIS, ROUTINE W REFLEX MICROSCOPIC
Bilirubin Urine: NEGATIVE
Glucose, UA: NEGATIVE mg/dL
Hgb urine dipstick: NEGATIVE
Ketones, ur: NEGATIVE mg/dL
Leukocytes, UA: NEGATIVE
Nitrite: NEGATIVE
Protein, ur: NEGATIVE mg/dL
Specific Gravity, Urine: 1.023 (ref 1.005–1.030)
Urobilinogen, UA: 1 mg/dL (ref 0.0–1.0)
pH: 6.5 (ref 5.0–8.0)

## 2013-07-04 LAB — CBC WITH DIFFERENTIAL/PLATELET
Basophils Absolute: 0 10*3/uL (ref 0.0–0.1)
Basophils Relative: 0 % (ref 0–1)
Eosinophils Absolute: 0.1 10*3/uL (ref 0.0–0.7)
Eosinophils Relative: 1 % (ref 0–5)
HCT: 39.1 % (ref 36.0–46.0)
Hemoglobin: 13.5 g/dL (ref 12.0–15.0)
Lymphocytes Relative: 33 % (ref 12–46)
Lymphs Abs: 2.5 10*3/uL (ref 0.7–4.0)
MCH: 31.6 pg (ref 26.0–34.0)
MCHC: 34.5 g/dL (ref 30.0–36.0)
MCV: 91.6 fL (ref 78.0–100.0)
Monocytes Absolute: 0.6 10*3/uL (ref 0.1–1.0)
Monocytes Relative: 8 % (ref 3–12)
Neutro Abs: 4.4 10*3/uL (ref 1.7–7.7)
Neutrophils Relative %: 58 % (ref 43–77)
Platelets: 382 10*3/uL (ref 150–400)
RBC: 4.27 MIL/uL (ref 3.87–5.11)
RDW: 15.2 % (ref 11.5–15.5)
WBC: 7.6 10*3/uL (ref 4.0–10.5)

## 2013-07-04 LAB — LIPASE, BLOOD: Lipase: 29 U/L (ref 11–59)

## 2013-07-04 MED ORDER — ONDANSETRON HCL 4 MG/2ML IJ SOLN
4.0000 mg | Freq: Once | INTRAMUSCULAR | Status: AC
  Administered 2013-07-04: 4 mg via INTRAVENOUS
  Filled 2013-07-04: qty 2

## 2013-07-04 MED ORDER — IOHEXOL 300 MG/ML  SOLN
100.0000 mL | Freq: Once | INTRAMUSCULAR | Status: AC | PRN
  Administered 2013-07-04: 100 mL via INTRAVENOUS

## 2013-07-04 MED ORDER — CYCLOBENZAPRINE HCL 10 MG PO TABS: 10.0000 mg | ORAL_TABLET | Freq: Two times a day (BID) | ORAL | Status: DC | PRN

## 2013-07-04 MED ORDER — HYDROMORPHONE HCL PF 1 MG/ML IJ SOLN
1.0000 mg | Freq: Once | INTRAMUSCULAR | Status: AC
  Administered 2013-07-04: 1 mg via INTRAVENOUS
  Filled 2013-07-04: qty 1

## 2013-07-04 MED ORDER — MORPHINE SULFATE 4 MG/ML IJ SOLN
4.0000 mg | Freq: Once | INTRAMUSCULAR | Status: AC
  Administered 2013-07-04: 4 mg via INTRAVENOUS
  Filled 2013-07-04: qty 1

## 2013-07-04 MED ORDER — HYDROCODONE-ACETAMINOPHEN 5-325 MG PO TABS: 2.0000 | ORAL_TABLET | ORAL | Status: DC | PRN

## 2013-07-04 NOTE — ED Notes (Signed)
Bed: AF79 Expected date:  Expected time:  Means of arrival:  Comments: ems- anxiety attack, vomited x1

## 2013-07-04 NOTE — Progress Notes (Signed)
[facility] CL provided pt with a [employer] application, to help patient establish primary care. CL has provided pt with same information for last 4 ED visits. Patient had Independence with [facility] and [employer] but it has since expired. On 04/18/13 ED visit CL scheduled patient a apt for re-enrollment with [facility] for 3/10 at 12:00pm. CL spoke with patient on 3/9 ED visit and reminded her of apt the next day. Spoke with patient today who states that she did not go to apt and did not rescheduled. Listed in system as no show. Provided pt with another application and encouraged patient to establish primary care.

## 2013-07-04 NOTE — ED Notes (Signed)
Pt complains of RLQ pain. Pt 9/10 pain. Sharp. Pt having to purse lip  breath to control pain. Pt had this pain in the past and was admitted to hospital.

## 2013-07-04 NOTE — ED Provider Notes (Signed)
CSN: [id number]     Arrival date & time 07/04/13  1213 History   First MD Initiated Contact with Patient 07/04/13 1309     Chief Complaint  Patient presents with  . Abdominal Pain     (Consider location/radiation/quality/duration/timing/severity/associated sxs/prior Treatment) HPI Comments: Patient is a 38 year old female with a past medical history of asthma and chronic pelvic pain The pain is located in the RLQ and does not radiate. The pain is described as aching and severe. The pain started gradually and progressively worsened since the onset. No alleviating/aggravating factors. The patient has tried nothing for symptoms without relief. Associated symptoms include nausea and vomiting. Patient denies fever, headache, diarrhea, chest pain, SOB, dysuria, constipation, abnormal vaginal bleeding/discharge.     Patient is a 38 y.o. female presenting with abdominal pain.  Abdominal Pain Associated symptoms: nausea and vomiting   Associated symptoms: no chest pain, no chills, no diarrhea, no dysuria, no fatigue, no fever and no shortness of breath     Past Medical History  Diagnosis Date  . Asthma   . EP (ectopic pregnancy)   . Ovarian tumor   . Tumor, thyroid   . Sleep apnea   . Chronic headaches   . Irregular heartbeat   . Chronic female pelvic pain 10/11/2012  . Abnormal uterine bleeding (AUB) 10/11/2012  . Cardiac arrest 1992    during delivery  . Shortness of breath   . Anemia    Past Surgical History  Procedure Laterality Date  . Cholecystectomy  2005  . Unilateral salpingectomy  2009    Abdominal left salpingectomy  . Vaginal hysterectomy N/A 01/06/2013    Procedure: HYSTERECTOMY VAGINAL;  Surgeon: Osborne Oman, MD;  Location: [facility];  Service: Gynecology;  Laterality: N/A;  . Colonoscopy Left 04/29/2013    Procedure: COLONOSCOPY;  Surgeon: Arta Silence, MD;  Location: [facility];  Service: Endoscopy;  Laterality: Left;   Family History  Problem Relation Age of  Onset  . Hypertension Mother   . Diabetes Mother   . Cancer Father   . Hyperlipidemia Father   . Hypertension Father   . Allergies Mother   . Heart disease Mother   . Heart disease Maternal Grandmother    History  Substance Use Topics  . Smoking status: Current Every Day Smoker -- 0.25 packs/day for 11 years    Types: Cigarettes    Last Attempt to Quit: 09/12/2012  . Smokeless tobacco: Never Used     Comment: 1 pack per week  . Alcohol Use: 4.8 oz/week    1 Cans of beer, 7 Glasses of wine per week     Comment: 1 bottle of wine per week   OB History   Grav Para Term Preterm Abortions TAB SAB Ect Mult Living   3 1 1  2 1  1  1      Review of Systems  Constitutional: Negative for fever, chills and fatigue.  HENT: Negative for trouble swallowing.   Eyes: Negative for visual disturbance.  Respiratory: Negative for shortness of breath.   Cardiovascular: Negative for chest pain and palpitations.  Gastrointestinal: Positive for nausea, vomiting and abdominal pain. Negative for diarrhea.  Genitourinary: Negative for dysuria and difficulty urinating.  Musculoskeletal: Negative for arthralgias and neck pain.  Skin: Negative for color change.  Neurological: Negative for dizziness and weakness.  Psychiatric/Behavioral: Negative for dysphoric mood.      Allergies  Penicillins; Shellfish allergy; and Other  Home Medications   Prior to Admission  medications   Medication Sig Start Date End Date Taking? Authorizing Provider  albuterol (PROVENTIL HFA;VENTOLIN HFA) 108 (90 BASE) MCG/ACT inhaler Inhale 1-2 puffs into the lungs every 6 (six) hours as needed for wheezing or shortness of breath.   Yes Historical Provider, MD  ARIPiprazole (ABILIFY) 20 MG tablet Take 20 mg by mouth daily.   Yes Historical Provider, MD   BP 142/98  Pulse 78  Resp 19  SpO2 99%  LMP 11/27/2012 Physical Exam  Nursing note and vitals reviewed. Constitutional: She appears well-developed and well-nourished.  No distress.  HENT:  Head: Normocephalic and atraumatic.  Eyes: Conjunctivae and EOM are normal.  Neck: Normal range of motion.  Cardiovascular: Normal rate and regular rhythm.  Exam reveals no gallop and no friction rub.   No murmur heard. Pulmonary/Chest: Effort normal and breath sounds normal. She has no wheezes. She has no rales. She exhibits no tenderness.  Abdominal: Soft. She exhibits no distension. There is tenderness. There is no rebound and no guarding.  RLQ tenderness to palpation. No other focal tenderness or peritoneal signs.   Musculoskeletal: Normal range of motion.  Neurological: She is alert.  Speech is goal-oriented. Moves limbs without ataxia.   Skin: Skin is warm and dry.  Psychiatric: She has a normal mood and affect. Her behavior is normal.    ED Course  Procedures (including critical care time) Labs Review Labs Reviewed  COMPREHENSIVE METABOLIC PANEL - Abnormal; Notable for the following:    Sodium 136 (*)    Potassium 5.7 (*)    Glucose, Bld 117 (*)    Albumin 3.0 (*)    ALT 54 (*)    Total Bilirubin 0.2 (*)    All other components within normal limits  URINALYSIS, ROUTINE W REFLEX MICROSCOPIC - Abnormal; Notable for the following:    APPearance CLOUDY (*)    All other components within normal limits  LIPASE, BLOOD  CBC WITH DIFFERENTIAL  CBC WITH DIFFERENTIAL    Imaging Review Ct Abdomen Pelvis W Contrast  07/04/2013   CLINICAL DATA:  Right lower quadrant pain for 1 day. Nausea and vomiting.  EXAM: CT ABDOMEN AND PELVIS WITH CONTRAST  TECHNIQUE: Multidetector CT imaging of the abdomen and pelvis was performed using the standard protocol following bolus administration of intravenous contrast.  CONTRAST:  179mL OMNIPAQUE IOHEXOL 300 MG/ML  SOLN  COMPARISON:  04/27/2013  FINDINGS: BODY WALL: Unremarkable.  LOWER CHEST: Unremarkable.  ABDOMEN/PELVIS:  Liver: Diffuse fatty infiltration.  Biliary: Cholecystectomy.  No biliary ductal dilatation.  Pancreas:  Unremarkable.  Spleen: Unremarkable.  Adrenals: Unremarkable.  Kidneys and ureters: No hydronephrosis or stone.  Bladder: Unremarkable.  Reproductive: Hysterectomy. There is a new or larger right ovarian cyst/ follicle which likely contains high density dependent fluid level. No hemo peritoneum.  Bowel: Few distal colonic diverticula. Resolved colonic thickening which was described on previous examination. No bowel obstruction. Normal appendix.  Retroperitoneum: No mass or adenopathy.  Peritoneum: No free fluid or gas.  Vascular: No acute abnormality.  OSSEOUS: No acute abnormalities.  IMPRESSION: 1. No acute intra-abdominal abnormality, including acute appendicitis. 2. 2.5 cm cyst in the right ovary, likely hemorrhagic. 3. Fatty liver.   Electronically Signed   By: Jorje Guild M.D.   On: 07/04/2013 14:50     EKG Interpretation None      MDM   Final diagnoses:  Right ovarian cyst    3:03 PM Patient's potassium elevated at 5.7 but is hemolyzed. Patient will have EKG. Remaining labs and  urinalysis unremarkable for acute changes. Patient given pain medication. Patient's CT unremarkable for acute changes.   3:34 PM Patient's EKG unremarkable for acute changes. Patient will be discharged with Vicodin and Flexeril for pain. Vitals stable and patient afebrile.     Alvina Chou, PA-C 07/04/13 1542

## 2013-07-04 NOTE — ED Notes (Signed)
Pt made aware of need for urine 

## 2013-07-04 NOTE — ED Notes (Addendum)
Pt transported to CT ?

## 2013-07-04 NOTE — ED Notes (Signed)
Patient is unable to urinate, but will try in a few

## 2013-07-04 NOTE — Discharge Instructions (Signed)
Take Vicodin as needed for pain. Take Flexeril as needed for muscle spasm. Refer to attached documents for more information. Follow up with your doctor.

## 2013-07-04 NOTE — ED Notes (Signed)
Pt states she sees little specs in front of her eyes and has pain still.

## 2013-07-04 NOTE — ED Notes (Signed)
Patient was at work when she had some abdominal pain and vomited.  EMS reports patient was hyperventilating and they did not see any evidence of vomiting.  EMS also reported patient had numbness and tingling in fingers which resolved when respirations were slowed down.  Patient reports she has had one episode of this before but does not take any medication for it.

## 2013-07-05 NOTE — ED Provider Notes (Signed)
Medical screening examination/treatment/procedure(s) were performed by non-physician practitioner and as supervising physician I was immediately available for consultation/collaboration.   EKG Interpretation None       Virgel Manifold, MD 07/05/13 [phone]

## 2013-07-08 ENCOUNTER — Emergency Department (HOSPITAL_COMMUNITY)
Admission: EM | Admit: 2013-07-08 | Discharge: 2013-07-08 | Disposition: A | Discharge status: Home / Self Care | Payer: No Typology Code available for payment source | Attending: Emergency Medicine | Admitting: Emergency Medicine

## 2013-07-08 ENCOUNTER — Encounter (HOSPITAL_COMMUNITY): Payer: Self-pay | Admitting: Emergency Medicine

## 2013-07-08 ENCOUNTER — Emergency Department (HOSPITAL_COMMUNITY): Payer: No Typology Code available for payment source

## 2013-07-08 DIAGNOSIS — R1031 Right lower quadrant pain: Secondary | ICD-10-CM | POA: Insufficient documentation

## 2013-07-08 DIAGNOSIS — Z880 Allergy status to penicillin: Secondary | ICD-10-CM | POA: Insufficient documentation

## 2013-07-08 DIAGNOSIS — Z85038 Personal history of other malignant neoplasm of large intestine: Secondary | ICD-10-CM | POA: Insufficient documentation

## 2013-07-08 DIAGNOSIS — Z8543 Personal history of malignant neoplasm of ovary: Secondary | ICD-10-CM | POA: Insufficient documentation

## 2013-07-08 DIAGNOSIS — Z8674 Personal history of sudden cardiac arrest: Secondary | ICD-10-CM | POA: Insufficient documentation

## 2013-07-08 DIAGNOSIS — R1032 Left lower quadrant pain: Secondary | ICD-10-CM | POA: Insufficient documentation

## 2013-07-08 DIAGNOSIS — G8929 Other chronic pain: Secondary | ICD-10-CM | POA: Insufficient documentation

## 2013-07-08 DIAGNOSIS — R109 Unspecified abdominal pain: Secondary | ICD-10-CM

## 2013-07-08 DIAGNOSIS — K625 Hemorrhage of anus and rectum: Secondary | ICD-10-CM | POA: Insufficient documentation

## 2013-07-08 DIAGNOSIS — J45909 Unspecified asthma, uncomplicated: Secondary | ICD-10-CM | POA: Insufficient documentation

## 2013-07-08 DIAGNOSIS — Z8679 Personal history of other diseases of the circulatory system: Secondary | ICD-10-CM | POA: Insufficient documentation

## 2013-07-08 DIAGNOSIS — R110 Nausea: Secondary | ICD-10-CM | POA: Insufficient documentation

## 2013-07-08 DIAGNOSIS — Z862 Personal history of diseases of the blood and blood-forming organs and certain disorders involving the immune mechanism: Secondary | ICD-10-CM | POA: Insufficient documentation

## 2013-07-08 DIAGNOSIS — R197 Diarrhea, unspecified: Secondary | ICD-10-CM | POA: Insufficient documentation

## 2013-07-08 DIAGNOSIS — F17200 Nicotine dependence, unspecified, uncomplicated: Secondary | ICD-10-CM | POA: Insufficient documentation

## 2013-07-08 LAB — CBC WITH DIFFERENTIAL/PLATELET
Basophils Absolute: 0 10*3/uL (ref 0.0–0.1)
Basophils Relative: 0 % (ref 0–1)
Eosinophils Absolute: 0.1 10*3/uL (ref 0.0–0.7)
Eosinophils Relative: 2 % (ref 0–5)
HCT: 40.6 % (ref 36.0–46.0)
Hemoglobin: 13.8 g/dL (ref 12.0–15.0)
Lymphocytes Relative: 43 % (ref 12–46)
Lymphs Abs: 3.1 10*3/uL (ref 0.7–4.0)
MCH: 31.4 pg (ref 26.0–34.0)
MCHC: 34 g/dL (ref 30.0–36.0)
MCV: 92.5 fL (ref 78.0–100.0)
Monocytes Absolute: 0.4 10*3/uL (ref 0.1–1.0)
Monocytes Relative: 6 % (ref 3–12)
Neutro Abs: 3.5 10*3/uL (ref 1.7–7.7)
Neutrophils Relative %: 49 % (ref 43–77)
Platelets: 373 10*3/uL (ref 150–400)
RBC: 4.39 MIL/uL (ref 3.87–5.11)
RDW: 14.8 % (ref 11.5–15.5)
WBC: 7.2 10*3/uL (ref 4.0–10.5)

## 2013-07-08 LAB — URINALYSIS, ROUTINE W REFLEX MICROSCOPIC
Bilirubin Urine: NEGATIVE
Glucose, UA: NEGATIVE mg/dL
Hgb urine dipstick: NEGATIVE
Ketones, ur: NEGATIVE mg/dL
Leukocytes, UA: NEGATIVE
Nitrite: NEGATIVE
Protein, ur: NEGATIVE mg/dL
Specific Gravity, Urine: 1.031 — ABNORMAL HIGH (ref 1.005–1.030)
Urobilinogen, UA: 0.2 mg/dL (ref 0.0–1.0)
pH: 5.5 (ref 5.0–8.0)

## 2013-07-08 LAB — COMPREHENSIVE METABOLIC PANEL
ALT: 40 U/L — ABNORMAL HIGH (ref 0–35)
AST: 43 U/L — ABNORMAL HIGH (ref 0–37)
Albumin: 3 g/dL — ABNORMAL LOW (ref 3.5–5.2)
Alkaline Phosphatase: 74 U/L (ref 39–117)
BUN: 4 mg/dL — ABNORMAL LOW (ref 6–23)
CO2: 16 mEq/L — ABNORMAL LOW (ref 19–32)
Calcium: 8.4 mg/dL (ref 8.4–10.5)
Chloride: 100 mEq/L (ref 96–112)
Creatinine, Ser: 0.59 mg/dL (ref 0.50–1.10)
GFR calc Af Amer: >90 mL/min (ref >90)
GFR calc non Af Amer: >90 mL/min (ref >90)
Glucose, Bld: 108 mg/dL — ABNORMAL HIGH (ref 70–99)
Potassium: 3.8 mEq/L (ref 3.7–5.3)
Sodium: 136 mEq/L — ABNORMAL LOW (ref 137–147)
Total Bilirubin: <0.2 mg/dL — ABNORMAL LOW (ref 0.3–1.2)
Total Protein: 6.8 g/dL (ref 6.0–8.3)

## 2013-07-08 LAB — I-STAT CG4 LACTIC ACID, ED
Lactic Acid, Venous: 2.21 mmol/L — ABNORMAL HIGH (ref 0.5–2.2)
Lactic Acid, Venous: 2.42 mmol/L — ABNORMAL HIGH (ref 0.5–2.2)

## 2013-07-08 LAB — BASIC METABOLIC PANEL
BUN: 5 mg/dL — ABNORMAL LOW (ref 6–23)
CO2: 21 mEq/L (ref 19–32)
Calcium: 7.6 mg/dL — ABNORMAL LOW (ref 8.4–10.5)
Chloride: 103 mEq/L (ref 96–112)
Creatinine, Ser: 0.58 mg/dL (ref 0.50–1.10)
GFR calc Af Amer: >90 mL/min (ref >90)
GFR calc non Af Amer: >90 mL/min (ref >90)
Glucose, Bld: 119 mg/dL — ABNORMAL HIGH (ref 70–99)
Potassium: 3.5 mEq/L — ABNORMAL LOW (ref 3.7–5.3)
Sodium: 139 mEq/L (ref 137–147)

## 2013-07-08 LAB — POC OCCULT BLOOD, ED: Fecal Occult Bld: NEGATIVE

## 2013-07-08 LAB — LIPASE, BLOOD: Lipase: 18 U/L (ref 11–59)

## 2013-07-08 MED ORDER — KETOROLAC TROMETHAMINE 30 MG/ML IJ SOLN
30.0000 mg | Freq: Once | INTRAMUSCULAR | Status: AC
  Administered 2013-07-08: 30 mg via INTRAVENOUS
  Filled 2013-07-08: qty 1

## 2013-07-08 MED ORDER — HYDROCODONE-ACETAMINOPHEN 5-325 MG PO TABS: 1.0000 | ORAL_TABLET | Freq: Four times a day (QID) | ORAL | Status: DC | PRN

## 2013-07-08 MED ORDER — FENTANYL CITRATE 0.05 MG/ML IJ SOLN
50.0000 ug | Freq: Once | INTRAMUSCULAR | Status: AC
  Administered 2013-07-08: 50 ug via INTRAVENOUS
  Filled 2013-07-08: qty 2

## 2013-07-08 MED ORDER — SODIUM CHLORIDE 0.9 % IV BOLUS (SEPSIS)
1000.0000 mL | Freq: Once | INTRAVENOUS | Status: AC
  Administered 2013-07-08: 1000 mL via INTRAVENOUS

## 2013-07-08 MED ORDER — METOCLOPRAMIDE HCL 5 MG/ML IJ SOLN
10.0000 mg | Freq: Once | INTRAMUSCULAR | Status: AC
  Administered 2013-07-08: 10 mg via INTRAVENOUS
  Filled 2013-07-08: qty 2

## 2013-07-08 NOTE — ED Notes (Signed)
Pt is aware a urine sample is needed, but is unable to urinate at this time. Pt was advised to ring call bell if assistance is needed when she does have the urge to go.

## 2013-07-08 NOTE — ED Notes (Signed)
Pt alert, nad, resp even unlabored, skin pwd, denies needs, ambulates to discharge

## 2013-07-08 NOTE — ED Provider Notes (Signed)
6:03 AM Patient signed out to me by Dammen, PA-C.  History provided by the patient and significant other. Patient is a 38 year old female with history of cholecystectomy, partial vaginal hysterectomy, chronic pelvic pain who presents with complaints of lower abdominal pain and rectal bleeding. Patient was seen and evaluated 4 days ago for abdominal pains. She was diagnosed with ovarian cysts. She has been taking medications for this but states it has not been helping significantly. She reports having some recent soft loose stools as well as small amounts of blood in the stool. Blood is bright red. She is concerned because 2 months ago she had a colonoscopy with polyp removed. She was recently called and told the polyp may have been cancerous but she is unsure and has a followup appointment. She reports some associated nausea and decreased appetite. Denies any fever, chills or sweats. There is no aggravating or alleviating factors. She denies any urinary symptoms.   Plan:  Recheck labs.  Ensure lactate is improving, anion gap is closing, and tachycardia is resolving.  If appropriate, discharge to home.  Patient has gastroenterology follow-up in 5 days.  Patient seen by and discussed with Dr. Regenia Skeeter.  Lactate has trended up slightly, but anion gap is closing and is now 15.  Patient feels better.  Will discuss with [facility], and ask for consultation given the lab findings.  8:05 AM Patient seen by and discussed with Dr. Broadus John, who recommend discharge to home with OBGYN follow-up at [facility].  Increase liquids.  Patient understands and agrees with the plan.  Montine Circle, PA-C 07/08/13 [phone]

## 2013-07-08 NOTE — ED Notes (Signed)
PA at bedside.

## 2013-07-08 NOTE — ED Notes (Signed)
Attempt to discharge pt w/o success, pt requesting prescription pain medication, ALP notified

## 2013-07-08 NOTE — ED Notes (Signed)
Pt presents with c/o sharp pain in her right side. Pt was seen Monday and told that she has a cyst on her ovary. Pt recently found a polyp to be cancerous from a colonoscopy. Pt has had some rectal bleeding off and on today.

## 2013-07-08 NOTE — ED Provider Notes (Signed)
CSN: [id number]     Arrival date & time 07/08/13  0101 History   First MD Initiated Contact with Patient 07/08/13 0151     Chief Complaint  Patient presents with  . Abdominal Pain  . Rectal Bleeding   HPI  History provided by the patient and significant other. Patient is a 38 year old female with history of cholecystectomy, partial vaginal hysterectomy, chronic pelvic pain who presents with complaints of lower abdominal pain and rectal bleeding. Patient was seen and evaluated 4 days ago for abdominal pains. She was diagnosed with ovarian cysts. She has been taking medications for this but states it has not been helping significantly. She reports having some recent soft loose stools as well as small amounts of blood in the stool. Blood is bright red. She is concerned because 2 months ago she had a colonoscopy with polyp removed. She was recently called and told the polyp may have been cancerous but she is unsure and has a followup appointment. She reports some associated nausea and decreased appetite. Denies any fever, chills or sweats. There is no aggravating or alleviating factors. She denies any urinary symptoms.    Past Medical History  Diagnosis Date  . Asthma   . EP (ectopic pregnancy)   . Ovarian tumor   . Tumor, thyroid   . Sleep apnea   . Chronic headaches   . Irregular heartbeat   . Chronic female pelvic pain 10/11/2012  . Abnormal uterine bleeding (AUB) 10/11/2012  . Cardiac arrest 1992    during delivery  . Shortness of breath   . Anemia   . Cancer     colon CA   Past Surgical History  Procedure Laterality Date  . Cholecystectomy  2005  . Unilateral salpingectomy  2009    Abdominal left salpingectomy  . Vaginal hysterectomy N/A 01/06/2013    Procedure: HYSTERECTOMY VAGINAL;  Surgeon: Osborne Oman, MD;  Location: [facility];  Service: Gynecology;  Laterality: N/A;  . Colonoscopy Left 04/29/2013    Procedure: COLONOSCOPY;  Surgeon: Arta Silence, MD;  Location: [facility];  Service: Endoscopy;  Laterality: Left;   Family History  Problem Relation Age of Onset  . Hypertension Mother   . Diabetes Mother   . Cancer Father   . Hyperlipidemia Father   . Hypertension Father   . Allergies Mother   . Heart disease Mother   . Heart disease Maternal Grandmother    History  Substance Use Topics  . Smoking status: Current Every Day Smoker -- 0.25 packs/day for 11 years    Types: Cigarettes    Last Attempt to Quit: 09/12/2012  . Smokeless tobacco: Never Used     Comment: 1 pack per week  . Alcohol Use: 4.8 oz/week    7 Glasses of wine, 1 Cans of beer per week     Comment: 1 bottle of wine per week   OB History   Grav Para Term Preterm Abortions TAB SAB Ect Mult Living   3 1 1  2 1  1  1      Review of Systems  Constitutional: Negative for fever and chills.  Gastrointestinal: Positive for nausea, abdominal pain, diarrhea and blood in stool. Negative for vomiting.  Genitourinary: Negative for dysuria, frequency, hematuria, flank pain, vaginal bleeding and vaginal discharge.  All other systems reviewed and are negative.     Allergies  Penicillins; Shellfish allergy; and Other  Home Medications   Prior to Admission medications   Medication Sig Start Date End  Date Taking? Authorizing Provider  albuterol (PROVENTIL HFA;VENTOLIN HFA) 108 (90 BASE) MCG/ACT inhaler Inhale 1-2 puffs into the lungs every 6 (six) hours as needed for wheezing or shortness of breath.   Yes Historical Provider, MD  ARIPiprazole (ABILIFY) 20 MG tablet Take 20 mg by mouth daily.   Yes Historical Provider, MD  cyclobenzaprine (FLEXERIL) 10 MG tablet Take 1 tablet (10 mg total) by mouth 2 (two) times daily as needed for muscle spasms. 07/04/13  Yes Alvina Chou, PA-C  HYDROcodone-acetaminophen (NORCO/VICODIN) 5-325 MG per tablet Take 2 tablets by mouth every 4 (four) hours as needed. 07/04/13  Yes Kaitlyn Szekalski, PA-C   BP 109/67  Pulse 119  Temp(Src) 98.2 F (36.8  C) (Oral)  Resp 18  Ht 5\' 5"  (1.651 m)  Wt 210 lb (95.255 kg)  BMI 34.95 kg/m2  SpO2 97%  LMP 11/27/2012 Physical Exam  Nursing note and vitals reviewed. Constitutional: She is oriented to person, place, and time. She appears well-developed and well-nourished. No distress.  HENT:  Head: Normocephalic.  Cardiovascular: Normal rate and regular rhythm.   No murmur heard. Pulmonary/Chest: Effort normal and breath sounds normal. No respiratory distress. She has no wheezes. She has no rales.  Abdominal: Soft. Bowel sounds are normal. She exhibits no distension. There is tenderness in the right upper quadrant and right lower quadrant. There is guarding. There is no rebound and no CVA tenderness.    Patient obese. Exam is somewhat limited due to body habitus.  Genitourinary:  Chaperone was present. No external hemorrhoids present. No fissures or gross blood. No significant stool in rectal vault. No masses. Patient does report tenderness on exam.  Musculoskeletal: Normal range of motion.  Neurological: She is alert and oriented to person, place, and time.  Skin: Skin is warm and dry. No rash noted.  Psychiatric: She has a normal mood and affect. Her behavior is normal.    ED Course  Procedures   COORDINATION OF CARE:  Nursing notes reviewed. Vital signs reviewed. Initial pt interview and examination performed.   Filed Vitals:   07/08/13 0122  BP: 109/67  Pulse: 119  Temp: 98.2 F (36.8 C)  TempSrc: Oral  Resp: 18  Height: 5\' 5"  (1.651 m)  Weight: 210 lb (95.255 kg)  SpO2: 97%    2:45 AM-patient seen and evaluated. She appears uncomfortable in pain. She reports possible diagnosis of cancerous polyp. Upon review of her medical chart pathology report showed benign nonmalignant adenoma.   Patient had recent CT scan 4 days ago. Does have right ovarian cyst 2.5 cm. Ultrasound ordered.   Patient was discussed with attending physician. The patient does have low bicarbonate and  slightly elevated lactic acid. This may be a result of dehydration and recent diarrhea. We'll plan to continue rehydration and recheck labs.  Ultrasound shows hemorrhagic cyst without torsion or other complication. Patient is significantly improved after medicines. She appears comfortable and resting well. I discussed plan for recheck of lab tests and she understands. If tests are improving she will be discharged home to followup with PCP and GI specialist as planned.  6:00 AM patient discussed in sign out with [employer] PA-C.  He will follow laboratory tests and reassess patient.    Treatment plan initiated: Medications  sodium chloride 0.9 % bolus 1,000 mL (1,000 mLs Intravenous New Bag/Given 07/08/13 0241)  ketorolac (TORADOL) 30 MG/ML injection 30 mg (30 mg Intravenous Given 07/08/13 0242)  metoCLOPramide (REGLAN) injection 10 mg (10 mg Intravenous Given 07/08/13 0244)  Results for orders placed during the hospital encounter of 07/08/13  CBC WITH DIFFERENTIAL      Result Value Ref Range   WBC 7.2  4.0 - 10.5 K/uL   RBC 4.39  3.87 - 5.11 MIL/uL   Hemoglobin 13.8  12.0 - 15.0 g/dL   HCT 40.6  36.0 - 46.0 %   MCV 92.5  78.0 - 100.0 fL   MCH 31.4  26.0 - 34.0 pg   MCHC 34.0  30.0 - 36.0 g/dL   RDW 14.8  11.5 - 15.5 %   Platelets 373  150 - 400 K/uL   Neutrophils Relative % 49  43 - 77 %   Neutro Abs 3.5  1.7 - 7.7 K/uL   Lymphocytes Relative 43  12 - 46 %   Lymphs Abs 3.1  0.7 - 4.0 K/uL   Monocytes Relative 6  3 - 12 %   Monocytes Absolute 0.4  0.1 - 1.0 K/uL   Eosinophils Relative 2  0 - 5 %   Eosinophils Absolute 0.1  0.0 - 0.7 K/uL   Basophils Relative 0  0 - 1 %   Basophils Absolute 0.0  0.0 - 0.1 K/uL  COMPREHENSIVE METABOLIC PANEL      Result Value Ref Range   Sodium 136 (*) 137 - 147 mEq/L   Potassium 3.8  3.7 - 5.3 mEq/L   Chloride 100  96 - 112 mEq/L   CO2 16 (*) 19 - 32 mEq/L   Glucose, Bld 108 (*) 70 - 99 mg/dL   BUN 4 (*) 6 - 23 mg/dL   Creatinine, Ser 0.59   0.50 - 1.10 mg/dL   Calcium 8.4  8.4 - 10.5 mg/dL   Total Protein 6.8  6.0 - 8.3 g/dL   Albumin 3.0 (*) 3.5 - 5.2 g/dL   AST 43 (*) 0 - 37 U/L   ALT 40 (*) 0 - 35 U/L   Alkaline Phosphatase 74  39 - 117 U/L   Total Bilirubin <0.2 (*) 0.3 - 1.2 mg/dL   GFR calc non Af Amer >90  >90 mL/min   GFR calc Af Amer >90  >90 mL/min  LIPASE, BLOOD      Result Value Ref Range   Lipase 18  11 - 59 U/L  POC OCCULT BLOOD, ED      Result Value Ref Range   Fecal Occult Bld NEGATIVE  NEGATIVE  I-STAT CG4 LACTIC ACID, ED      Result Value Ref Range   Lactic Acid, Venous 2.21 (*) 0.5 - 2.2 mmol/L       Imaging Review US Transvaginal Non-ob  07/08/2013   CLINICAL DATA:  Right-sided pelvic pain for the past 5 days. Evaluate for ovarian torsion.  EXAM: TRANSABDOMINAL AND TRANSVAGINAL ULTRASOUND OF PELVIS  DOPPLER ULTRASOUND OF OVARIES  TECHNIQUE: Both transabdominal and transvaginal ultrasound examinations of the pelvis were performed. Transabdominal technique was performed for global imaging of the pelvis including uterus, ovaries, adnexal regions, and pelvic cul-de-sac.  It was necessary to proceed with endovaginal exam following the transabdominal exam to visualize the bilateral ovaries. Color and duplex Doppler ultrasound was utilized to evaluate blood flow to the ovaries.  COMPARISON:  Pelvic ultrasound 11/29/2012.  FINDINGS: Uterus  Status post hysterectomy.  Right ovary  Measurements: 5.4 x 3.1 x 3.2 cm. There is a complex area of heterogeneous echogenicity measuring 2.9 x 2.5 x 2.3 cm, most compatible with a hemorrhagic cyst.  Left ovary  Measurements: 3.4 x 2.0 x  1.8 cm. Normal appearance/no adnexal mass.  Pulsed Doppler evaluation of both ovaries demonstrates normal low-resistance arterial and venous waveforms.  Other findings  No free fluid.  IMPRESSION: 1. 2.9 x 2.5 x 2.3 cm hemorrhagic cyst in the right ovary. 2. No evidence of ovarian torsion. 3. Status posthysterectomy.   Electronically Signed    By: Vinnie Langton M.D.   On: 07/08/2013 04:59   US Pelvis Complete  07/08/2013   CLINICAL DATA:  Right-sided pelvic pain for the past 5 days. Evaluate for ovarian torsion.  EXAM: TRANSABDOMINAL AND TRANSVAGINAL ULTRASOUND OF PELVIS  DOPPLER ULTRASOUND OF OVARIES  TECHNIQUE: Both transabdominal and transvaginal ultrasound examinations of the pelvis were performed. Transabdominal technique was performed for global imaging of the pelvis including uterus, ovaries, adnexal regions, and pelvic cul-de-sac.  It was necessary to proceed with endovaginal exam following the transabdominal exam to visualize the bilateral ovaries. Color and duplex Doppler ultrasound was utilized to evaluate blood flow to the ovaries.  COMPARISON:  Pelvic ultrasound 11/29/2012.  FINDINGS: Uterus  Status post hysterectomy.  Right ovary  Measurements: 5.4 x 3.1 x 3.2 cm. There is a complex area of heterogeneous echogenicity measuring 2.9 x 2.5 x 2.3 cm, most compatible with a hemorrhagic cyst.  Left ovary  Measurements: 3.4 x 2.0 x 1.8 cm. Normal appearance/no adnexal mass.  Pulsed Doppler evaluation of both ovaries demonstrates normal low-resistance arterial and venous waveforms.  Other findings  No free fluid.  IMPRESSION: 1. 2.9 x 2.5 x 2.3 cm hemorrhagic cyst in the right ovary. 2. No evidence of ovarian torsion. 3. Status posthysterectomy.   Electronically Signed   By: Vinnie Langton M.D.   On: 07/08/2013 04:59   [name] Art/ven Flow Abd Pelv Doppler  07/08/2013   CLINICAL DATA:  Right-sided pelvic pain for the past 5 days. Evaluate for ovarian torsion.  EXAM: TRANSABDOMINAL AND TRANSVAGINAL ULTRASOUND OF PELVIS  DOPPLER ULTRASOUND OF OVARIES  TECHNIQUE: Both transabdominal and transvaginal ultrasound examinations of the pelvis were performed. Transabdominal technique was performed for global imaging of the pelvis including uterus, ovaries, adnexal regions, and pelvic cul-de-sac.  It was necessary to proceed with endovaginal exam  following the transabdominal exam to visualize the bilateral ovaries. Color and duplex Doppler ultrasound was utilized to evaluate blood flow to the ovaries.  COMPARISON:  Pelvic ultrasound 11/29/2012.  FINDINGS: Uterus  Status post hysterectomy.  Right ovary  Measurements: 5.4 x 3.1 x 3.2 cm. There is a complex area of heterogeneous echogenicity measuring 2.9 x 2.5 x 2.3 cm, most compatible with a hemorrhagic cyst.  Left ovary  Measurements: 3.4 x 2.0 x 1.8 cm. Normal appearance/no adnexal mass.  Pulsed Doppler evaluation of both ovaries demonstrates normal low-resistance arterial and venous waveforms.  Other findings  No free fluid.  IMPRESSION: 1. 2.9 x 2.5 x 2.3 cm hemorrhagic cyst in the right ovary. 2. No evidence of ovarian torsion. 3. Status posthysterectomy.   Electronically Signed   By: Vinnie Langton M.D.   On: 07/08/2013 04:59     MDM   Final diagnoses:  None        Martie Lee, PA-C 07/08/13 [phone]

## 2013-07-08 NOTE — Consult Note (Signed)
Pt seen and examined, full note to follow, briefly, 37/F with chronic pelvic pain for months, H/o vaginal hysterectomy in 11/14, normal colonoscopy in 3/15 for reported rectal bleeding, had polyp removed which was tubular adenoma without dysplasia, SHE WAS CORRECTED THAT SHE DOES NOT HAVE RECTAL CAncer. Now back in ER with her RLQ pelvic pain, which is the usual location as her chronic pain, but was more intense this am, improved now. On exam: Obese female laying on stretcher no distress On exam: mild R lower pelvic tenderness, rest unremarkable CT ABd pelvis 5/18: benign except for 2.5cm cyst in R ovary USG today shows same Labs normal except for minimally elevated lactic acid Vitals signs normal Impression: Chronic pelvic pain-possibly from R ovian cyst/hemorrhage or post hysterectomy-scar tissue related She is advised to advance diet as tolerated and to FU with her Editor, commissioning at [facility] soon  Domenic Polite MD [phone]

## 2013-07-08 NOTE — ED Notes (Signed)
Hospitalist bedside 

## 2013-07-08 NOTE — Discharge Instructions (Signed)
Abdominal Pain, Adult °Many things can cause abdominal pain. Usually, abdominal pain is not caused by a disease and will improve without treatment. It can often be observed and treated at home. Your health care provider will do a physical exam and possibly order blood tests and X-rays to help determine the seriousness of your pain. However, in many cases, more time must pass before a clear cause of the pain can be found. Before that point, your health care provider may not know if you need more testing or further treatment. °HOME CARE INSTRUCTIONS  °Monitor your abdominal pain for any changes. The following actions may help to alleviate any discomfort you are experiencing: °· Only take over-the-counter or prescription medicines as directed by your health care provider. °· Do not take laxatives unless directed to do so by your health care provider. °· Try a clear liquid diet (broth, tea, or water) as directed by your health care provider. Slowly move to a bland diet as tolerated. °SEEK MEDICAL CARE IF: °· You have unexplained abdominal pain. °· You have abdominal pain associated with nausea or diarrhea. °· You have pain when you urinate or have a bowel movement. °· You experience abdominal pain that wakes you in the night. °· You have abdominal pain that is worsened or improved by eating food. °· You have abdominal pain that is worsened with eating fatty foods. °SEEK IMMEDIATE MEDICAL CARE IF:  °· Your pain does not go away within 2 hours. °· You have a fever. °· You keep throwing up (vomiting). °· Your pain is felt only in portions of the abdomen, such as the right side or the left lower portion of the abdomen. °· You pass bloody or black tarry stools. °MAKE SURE YOU: °· Understand these instructions.   °· Will watch your condition.   °· Will get help right away if you are not doing well or get worse.   °Document Released: 11/13/2004 Document Revised: 11/24/2012 Document Reviewed: 10/13/2012 °[employer]® Patient  Information ©2014 [employer]. ° °

## 2013-07-08 NOTE — ED Notes (Signed)
Pt reports that she was seen on Monday for abdominal pain, and was told she had a cyst on her ovary, pain has continued to her R lower abdomen since that time. Pt had a colonoscopy for blood in her stool, and was told that she has rectal CA. Pt scheduled for an oncologist appointment but reports her abdominal and rectal pain has been unbearable. Pt continued to have bright red blood from her rectum. Pt a&o x4, skin warm and dry.

## 2013-07-12 NOTE — ED Provider Notes (Signed)
Medical screening examination/treatment/procedure(s) were conducted as a shared visit with non-physician practitioner(s) and myself.  I personally evaluated the patient during the encounter.   EKG Interpretation None      Patient with abdominal pain, likely from cyst, as well as diarrhea, possibly bloody. Has elevated anion gap with mild elevated lactate. After fluids and pain control her VS are normal and she appears well. Gap has improved but lactate unchanged. Given this will consult hospitalist.  Ephraim Hamburger, MD 07/12/13 1729

## 2013-07-25 ENCOUNTER — Encounter: Payer: Self-pay | Admitting: Internal Medicine

## 2013-07-25 ENCOUNTER — Ambulatory Visit: Payer: No Typology Code available for payment source | Attending: Internal Medicine | Admitting: Internal Medicine

## 2013-07-25 VITALS — BP 138/90 | HR 89 | Temp 98.2°F | Resp 14 | Ht 65.0 in | Wt 236.0 lb

## 2013-07-25 DIAGNOSIS — N949 Unspecified condition associated with female genital organs and menstrual cycle: Secondary | ICD-10-CM | POA: Insufficient documentation

## 2013-07-25 DIAGNOSIS — K625 Hemorrhage of anus and rectum: Secondary | ICD-10-CM

## 2013-07-25 DIAGNOSIS — R102 Pelvic and perineal pain: Secondary | ICD-10-CM | POA: Insufficient documentation

## 2013-07-25 DIAGNOSIS — Z880 Allergy status to penicillin: Secondary | ICD-10-CM | POA: Insufficient documentation

## 2013-07-25 DIAGNOSIS — Z91013 Allergy to seafood: Secondary | ICD-10-CM | POA: Insufficient documentation

## 2013-07-25 DIAGNOSIS — Z91018 Allergy to other foods: Secondary | ICD-10-CM | POA: Insufficient documentation

## 2013-07-25 DIAGNOSIS — Z79899 Other long term (current) drug therapy: Secondary | ICD-10-CM | POA: Insufficient documentation

## 2013-07-25 HISTORY — DX: Pelvic and perineal pain: R10.2

## 2013-07-25 HISTORY — DX: Hemorrhage of anus and rectum: K62.5

## 2013-07-25 MED ORDER — TRAMADOL HCL 50 MG PO TABS: 50.0000 mg | ORAL_TABLET | Freq: Three times a day (TID) | ORAL | Status: DC | PRN

## 2013-07-25 MED ORDER — NAPROXEN 500 MG PO TABS: 500.0000 mg | ORAL_TABLET | Freq: Two times a day (BID) | ORAL | Status: DC

## 2013-07-25 NOTE — Patient Instructions (Signed)
Ovarian Cyst An ovarian cyst is a fluid-filled sac that forms on an ovary. The ovaries are small organs that produce eggs in women. Various types of cysts can form on the ovaries. Most are not cancerous. Many do not cause problems, and they often go away on their own. Some may cause symptoms and require treatment. Common types of ovarian cysts include:  Functional cysts These cysts may occur every month during the menstrual cycle. This is normal. The cysts usually go away with the next menstrual cycle if the woman does not get pregnant. Usually, there are no symptoms with a functional cyst.  Endometrioma cysts These cysts form from the tissue that lines the uterus. They are also called "chocolate cysts" because they become filled with blood that turns brown. This type of cyst can cause pain in the lower abdomen during intercourse and with your menstrual period.  Cystadenoma cysts This type develops from the cells on the outside of the ovary. These cysts can get very big and cause lower abdomen pain and pain with intercourse. This type of cyst can twist on itself, cut off its blood supply, and cause severe pain. It can also easily rupture and cause a lot of pain.  Dermoid cysts This type of cyst is sometimes found in both ovaries. These cysts may contain different kinds of body tissue, such as skin, teeth, hair, or cartilage. They usually do not cause symptoms unless they get very big.  Theca lutein cysts These cysts occur when too much of a certain hormone (human chorionic gonadotropin) is produced and overstimulates the ovaries to produce an egg. This is most common after procedures used to assist with the conception of a baby (in vitro fertilization). CAUSES   Fertility drugs can cause a condition in which multiple large cysts are formed on the ovaries. This is called ovarian hyperstimulation syndrome.  A condition called polycystic ovary syndrome can cause hormonal imbalances that can lead to  nonfunctional ovarian cysts. SIGNS AND SYMPTOMS  Many ovarian cysts do not cause symptoms. If symptoms are present, they may include:  Pelvic pain or pressure.  Pain in the lower abdomen.  Pain during sexual intercourse.  Increasing girth (swelling) of the abdomen.  Abnormal menstrual periods.  Increasing pain with menstrual periods.  Stopping having menstrual periods without being pregnant. DIAGNOSIS  These cysts are commonly found during a routine or annual pelvic exam. Tests may be ordered to find out more about the cyst. These tests may include:  Ultrasound.  X-ray of the pelvis.  CT scan.  MRI.  Blood tests. TREATMENT  Many ovarian cysts go away on their own without treatment. Your health care provider may want to check your cyst regularly for 2 3 months to see if it changes. For women in menopause, it is particularly important to monitor a cyst closely because of the higher rate of ovarian cancer in menopausal women. When treatment is needed, it may include any of the following:  A procedure to drain the cyst (aspiration). This may be done using a long needle and ultrasound. It can also be done through a laparoscopic procedure. This involves using a thin, lighted tube with a tiny camera on the end (laparoscope) inserted through a small incision.  Surgery to remove the whole cyst. This may be done using laparoscopic surgery or an open surgery involving a larger incision in the lower abdomen.  Hormone treatment or birth control pills. These methods are sometimes used to help dissolve a cyst. HOME CARE  INSTRUCTIONS   Only take over-the-counter or prescription medicines as directed by your health care provider.  Follow up with your health care provider as directed.  Get regular pelvic exams and Pap tests. SEEK MEDICAL CARE IF:   Your periods are late, irregular, or painful, or they stop.  Your pelvic pain or abdominal pain does not go away.  Your abdomen becomes  larger or swollen.  You have pressure on your bladder or trouble emptying your bladder completely.  You have pain during sexual intercourse.  You have feelings of fullness, pressure, or discomfort in your stomach.  You lose weight for no apparent reason.  You feel generally ill.  You become constipated.  You lose your appetite.  You develop acne.  You have an increase in body and facial hair.  You are gaining weight, without changing your exercise and eating habits.  You think you are pregnant. SEEK IMMEDIATE MEDICAL CARE IF:   You have increasing abdominal pain.  You feel sick to your stomach (nauseous), and you throw up (vomit).  You develop a fever that comes on suddenly.  You have abdominal pain during a bowel movement.  Your menstrual periods become heavier than usual. Document Released: 02/03/2005 Document Revised: 11/24/2012 Document Reviewed: 10/11/2012 [facility] Patient Information 2014 [place]. Rectal Bleeding Rectal bleeding is when blood passes out of the anus. It is usually a sign that something is wrong. It may not be serious, but it should always be evaluated. Rectal bleeding may present as bright red blood or extremely dark stools. The color may range from dark red or maroon to black (like tar). It is important that the cause of rectal bleeding be identified so treatment can be started and the problem corrected. CAUSES   Hemorrhoids. These are enlarged (dilated) blood vessels or veins in the anal or rectal area.  Fistulas. Theseare abnormal, burrowing channels that usually run from inside the rectum to the skin around the anus. They can bleed.  Anal fissures. This is a tear in the tissue of the anus. Bleeding occurs with bowel movements.  Diverticulosis. This is a condition in which pockets or sacs project from the bowel wall. Occasionally, the sacs can bleed.  Diverticulitis. Thisis an infection involving diverticulosis of the  colon.  Proctitis and colitis. These are conditions in which the rectum, colon, or both, can become inflamed and pitted (ulcerated).  Polyps and cancer. Polyps are non-cancerous (benign) growths in the colon that may bleed. Certain types of polyps turn into cancer.  Protrusion of the rectum. Part of the rectum can project from the anus and bleed.  Certain medicines.  Intestinal infections.  Blood vessel abnormalities. HOME CARE INSTRUCTIONS  Eat a high-fiber diet to keep your stool soft.  Limit activity.  Drink enough fluids to keep your urine clear or pale yellow.  Warm baths may be useful to soothe rectal pain.  Follow up with your caregiver as directed. SEEK IMMEDIATE MEDICAL CARE IF:  You develop increased bleeding.  You have black or dark red stools.  You vomit blood or material that looks like coffee grounds.  You have abdominal pain or tenderness.  You have a fever.  You feel weak, nauseous, or you faint.  You have severe rectal pain or you are unable to have a bowel movement. MAKE SURE YOU:  Understand these instructions.  Will watch your condition.  Will get help right away if you are not doing well or get worse. Document Released: 07/26/2001 Document Revised: 04/28/2011 Document  Reviewed: 07/21/2010 [employer] Patient Information 2014 [place], [place].

## 2013-07-25 NOTE — Progress Notes (Signed)
Pt has ovarian cysts causing her to have extreme pain in her right lower abdomen. About a month ago she had a polyp removed from her rectum and she reports that she is still having heavy bleeding.

## 2013-07-25 NOTE — Progress Notes (Signed)
Patient ID: ANAILY [name], female   DOB: May 11, 1975, 38 y.o.   MRN: [id number]   Marisa [name], is a 38 y.o. female  EHU:[id number]  ZCH:[id number]  DOB - 1975/07/28  Chief Complaint  Patient presents with  . Follow-up        Subjective:   Marisa [name] is a 38 y.o. female here today for a follow up visit. Patient recently had transvaginal hysterectomy for excessive menorrhagia, and since then she has been having abdominal pain, recently in the ER she was found to have ovarian cyst, and she attributes this abdominal pain to the cysts. Patient has No headache, No chest pain, No abdominal pain - No Nausea, No new weakness tingling or numbness, No Cough - SOB.  Problem  Pelvic Pain  Rectal Bleeding    ALLERGIES: Allergies  Allergen Reactions  . Penicillins Anaphylaxis  . Shellfish Allergy Anaphylaxis  . Other     Mushroom-- swelling throat, eyes Animals-- swelling throat, eyes    PAST MEDICAL HISTORY: Past Medical History  Diagnosis Date  . Asthma   . EP (ectopic pregnancy)   . Ovarian tumor   . Tumor, thyroid   . Sleep apnea   . Chronic headaches   . Irregular heartbeat   . Chronic female pelvic pain 10/11/2012  . Abnormal uterine bleeding (AUB) 10/11/2012  . Cardiac arrest 1992    during delivery  . Shortness of breath   . Anemia   . Cancer     colon CA    MEDICATIONS AT HOME: Prior to Admission medications   Medication Sig Start Date End Date Taking? Authorizing Provider  albuterol (PROVENTIL HFA;VENTOLIN HFA) 108 (90 BASE) MCG/ACT inhaler Inhale 1-2 puffs into the lungs every 6 (six) hours as needed for wheezing or shortness of breath.   Yes Historical Provider, MD  ARIPiprazole (ABILIFY) 20 MG tablet Take 20 mg by mouth daily.   Yes Historical Provider, MD  cyclobenzaprine (FLEXERIL) 10 MG tablet Take 1 tablet (10 mg total) by mouth 2 (two) times daily as needed for muscle spasms. 07/04/13   Alvina Chou, PA-C  HYDROcodone-acetaminophen  (NORCO/VICODIN) 5-325 MG per tablet Take 2 tablets by mouth every 4 (four) hours as needed. 07/04/13   Kaitlyn Szekalski, PA-C  naproxen (NAPROSYN) 500 MG tablet Take 1 tablet (500 mg total) by mouth 2 (two) times daily with a meal. 07/25/13   Angelica Chessman, MD  traMADol (ULTRAM) 50 MG tablet Take 1 tablet (50 mg total) by mouth every 8 (eight) hours as needed. 07/25/13   Angelica Chessman, MD     Objective:   Filed Vitals:   07/25/13 1714  BP: 138/90  Pulse: 89  Temp: 98.2 F (36.8 C)  TempSrc: Oral  Resp: 14  Height: 5\' 5"  (1.651 m)  Weight: 236 lb (107.049 kg)  SpO2: 95%    Exam General appearance : Awake, alert, not in any distress. Speech Clear. Not toxic looking HEENT: Atraumatic and Normocephalic, pupils equally reactive to light and accomodation Neck: supple, no JVD. No cervical lymphadenopathy.  Chest:Good air entry bilaterally, no added sounds  CVS: S1 S2 regular, no murmurs.  Abdomen: Bowel sounds present, Non tender and not distended with no gaurding, rigidity or rebound. Extremities: B/L Lower Ext shows no edema, both legs are warm to touch Neurology: Awake alert, and oriented X 3, CN II-XII intact, Non focal Skin:No Rash Wounds:N/A  Data Review No results found for this basename: HGBA1C     Assessment & Plan   1. Pelvic pain  -  Ambulatory referral to Gynecology - traMADol (ULTRAM) 50 MG tablet; Take 1 tablet (50 mg total) by mouth every 8 (eight) hours as needed.  Dispense: 60 tablet; Refill: 0 - naproxen (NAPROSYN) 500 MG tablet; Take 1 tablet (500 mg total) by mouth 2 (two) times daily with a meal.  Dispense: 30 tablet; Refill: 0  2. Rectal bleeding  - Ambulatory referral to Gastroenterology for possible colonoscopy   Return in about 3 months (around 10/25/2013), or if symptoms worsen or fail to improve, for Abdominal Pain, Follow up Pain and comorbidities.  The patient was given clear instructions to go to ER or return to medical center if symptoms  don't improve, worsen or new problems develop. The patient verbalized understanding. The patient was told to call to get lab results if they haven't heard anything in the next week.   This note has been created with Surveyor, quantity. Any transcriptional errors are unintentional.    Angelica Chessman, MD, [place], [place], [place] and [facility] [place], [place]   07/25/2013, 5:48 PM

## 2013-07-29 ENCOUNTER — Telehealth: Payer: Self-pay | Admitting: *Deleted

## 2013-07-29 NOTE — Telephone Encounter (Signed)
I spoke to the pt she was concerned about her referrals and she has a form to be filled out. I informed her about the referral appointments and told her that when we completed the document for her work then we would call her so she can come pick it up.

## 2013-08-15 ENCOUNTER — Ambulatory Visit: Payer: No Typology Code available for payment source | Admitting: Family Medicine

## 2013-08-18 ENCOUNTER — Encounter: Payer: Self-pay | Admitting: *Deleted

## 2013-08-24 ENCOUNTER — Ambulatory Visit (INDEPENDENT_AMBULATORY_CARE_PROVIDER_SITE_OTHER): Payer: No Typology Code available for payment source | Admitting: Obstetrics & Gynecology

## 2013-08-24 ENCOUNTER — Encounter: Payer: Self-pay | Admitting: Obstetrics & Gynecology

## 2013-08-24 VITALS — BP 134/94 | HR 98 | Temp 97.3°F | Ht 65.0 in | Wt 243.0 lb

## 2013-08-24 DIAGNOSIS — R109 Unspecified abdominal pain: Secondary | ICD-10-CM

## 2013-08-24 NOTE — Patient Instructions (Signed)
Return to clinic for any scheduled appointments or for any gynecologic concerns as needed.   

## 2013-08-24 NOTE — Progress Notes (Signed)
   CLINIC ENCOUNTER NOTE  History:  38 y.o. [id number] here today for persistent R sided pain. U/S in 07/08/13 showed small hemorrhagic cyst, which she feels is the etiology of her pain. Pain is constant. Also just had colonic polyp removed, having some GI bleeding but followed by her Gastroenterologist.  The following portions of the patient's history were reviewed and updated as appropriate: allergies, current medications, past family history, past medical history, past social history, past surgical history and problem list.  Review of Systems:  Pertinent items are noted in HPI.  Objective:  Physical Exam BP 134/94  Pulse 98  Temp(Src) 97.3 F (36.3 C) (Oral)  Ht 5\' 5"  (1.651 m)  Wt 243 lb (110.224 kg)  BMI 40.44 kg/m2  LMP 11/27/2012 Gen: NAD Abd: Soft, mild right abdominal tenderness, not corresponding to location of her right ovary.  Nondistended Pelvic: Deferred  Labs and Imaging 07/08/13 TRANSABDOMINAL AND TRANSVAGINAL ULTRASOUND OF PELVIS  DOPPLER ULTRASOUND OF OVARIES CLINICAL DATA: Right-sided pelvic pain for the past 5 days. Evaluate for ovarian torsion. COMPARISON: Pelvic ultrasound 11/29/2012. FINDINGS: Uterus Status post hysterectomy. Right ovary Measurements: 5.4 x 3.1 x 3.2 cm. There is a complex area of heterogeneous echogenicity measuring 2.9 x 2.5 x 2.3 cm, most compatible with a hemorrhagic cyst. Left ovary Measurements: 3.4 x 2.0 x 1.8 cm. Normal appearance/no adnexal mass. Pulsed Doppler evaluation of both ovaries demonstrates normal low-resistance arterial and venous waveforms. Other findings No free fluid.  IMPRESSION: 1. 2.9 x 2.5 x 2.3 cm hemorrhagic cyst in the right ovary. 2. No evidence of ovarian torsion. 3. Status posthysterectomy.   Assessment & Plan:  Patient reassured; declines repeat ultrasound for now. She was told if she changes her mind to let us know. Return for any GYN symptoms Follow up with GI doctor    Verita Schneiders, MD, [place] Attending  [place] for [place], [place]

## 2013-09-29 ENCOUNTER — Ambulatory Visit: Payer: No Typology Code available for payment source | Attending: Internal Medicine | Admitting: Internal Medicine

## 2013-09-29 ENCOUNTER — Encounter: Payer: Self-pay | Admitting: Internal Medicine

## 2013-09-29 VITALS — BP 114/79 | HR 86 | Temp 98.4°F | Resp 18 | Ht 65.0 in | Wt 241.6 lb

## 2013-09-29 DIAGNOSIS — J45909 Unspecified asthma, uncomplicated: Secondary | ICD-10-CM | POA: Insufficient documentation

## 2013-09-29 DIAGNOSIS — H5711 Ocular pain, right eye: Secondary | ICD-10-CM

## 2013-09-29 DIAGNOSIS — Z91018 Allergy to other foods: Secondary | ICD-10-CM | POA: Insufficient documentation

## 2013-09-29 DIAGNOSIS — F17200 Nicotine dependence, unspecified, uncomplicated: Secondary | ICD-10-CM | POA: Insufficient documentation

## 2013-09-29 DIAGNOSIS — M79671 Pain in right foot: Secondary | ICD-10-CM

## 2013-09-29 DIAGNOSIS — H5710 Ocular pain, unspecified eye: Secondary | ICD-10-CM

## 2013-09-29 DIAGNOSIS — D500 Iron deficiency anemia secondary to blood loss (chronic): Secondary | ICD-10-CM | POA: Insufficient documentation

## 2013-09-29 DIAGNOSIS — R5381 Other malaise: Secondary | ICD-10-CM | POA: Insufficient documentation

## 2013-09-29 DIAGNOSIS — Z880 Allergy status to penicillin: Secondary | ICD-10-CM | POA: Insufficient documentation

## 2013-09-29 DIAGNOSIS — R5383 Other fatigue: Secondary | ICD-10-CM

## 2013-09-29 DIAGNOSIS — Z8249 Family history of ischemic heart disease and other diseases of the circulatory system: Secondary | ICD-10-CM | POA: Insufficient documentation

## 2013-09-29 DIAGNOSIS — Z85038 Personal history of other malignant neoplasm of large intestine: Secondary | ICD-10-CM | POA: Insufficient documentation

## 2013-09-29 DIAGNOSIS — G4730 Sleep apnea, unspecified: Secondary | ICD-10-CM | POA: Insufficient documentation

## 2013-09-29 DIAGNOSIS — Z91013 Allergy to seafood: Secondary | ICD-10-CM | POA: Insufficient documentation

## 2013-09-29 DIAGNOSIS — M79609 Pain in unspecified limb: Secondary | ICD-10-CM | POA: Insufficient documentation

## 2013-09-29 LAB — CBC
HCT: 42.5 % (ref 36.0–46.0)
Hemoglobin: 14.5 g/dL (ref 12.0–15.0)
MCH: 32.2 pg (ref 26.0–34.0)
MCHC: 34.1 g/dL (ref 30.0–36.0)
MCV: 94.4 fL (ref 78.0–100.0)
Platelets: 398 10*3/uL (ref 150–400)
RBC: 4.5 MIL/uL (ref 3.87–5.11)
RDW: 13.1 % (ref 11.5–15.5)
WBC: 6.4 10*3/uL (ref 4.0–10.5)

## 2013-09-29 LAB — HEMOGLOBIN A1C
Hgb A1c MFr Bld: 6.5 % — ABNORMAL HIGH (ref <5.7)
Mean Plasma Glucose: 140 mg/dL — ABNORMAL HIGH (ref <117)

## 2013-09-29 MED ORDER — MELOXICAM 7.5 MG PO TABS: 7.5000 mg | ORAL_TABLET | Freq: Every day | ORAL | Status: DC

## 2013-09-29 NOTE — Progress Notes (Signed)
Patient ID: Marisa [name], female   DOB: 1975/09/23, 38 y.o.   MRN: [id number]  CC: annual exam  HPI:  Patient presents today for evaluation of rectal bleeding that has been present for the past 5 months.  She states that she had a colonoscopy 3 months ago and had polyps removed.  She states that she has had blood clots in her stool since the removal.  She reports that she was given two creams to help with bleeding because they thought she may have a rectal tear about 4 weeks ago.  She reports that creams have not helped and she is concerned about the amount of blood (nickel sized clots).  She c/o of headaches, high blood pressure, and panic attacks for the past 6 months.  She states that she begins to cry, have chest flutters, feelings of doom, and restless legs.    Allergies  Allergen Reactions  . Penicillins Anaphylaxis  . Shellfish Allergy Anaphylaxis  . Other     Mushroom-- swelling throat, eyes Animals-- swelling throat, eyes   Past Medical History  Diagnosis Date  . Asthma   . EP (ectopic pregnancy)   . Ovarian tumor   . Tumor, thyroid   . Sleep apnea   . Chronic headaches   . Irregular heartbeat   . Chronic female pelvic pain 10/11/2012  . Abnormal uterine bleeding (AUB) 10/11/2012  . Cardiac arrest 1992    during delivery  . Shortness of breath   . Anemia   . Cancer     colon CA   Current Outpatient Prescriptions on File Prior to Visit  Medication Sig Dispense Refill  . albuterol (PROVENTIL HFA;VENTOLIN HFA) 108 (90 BASE) MCG/ACT inhaler Inhale 1-2 puffs into the lungs every 6 (six) hours as needed for wheezing or shortness of breath.      . ARIPiprazole (ABILIFY) 20 MG tablet Take 20 mg by mouth daily.      Marland Kitchen HYDROcodone-acetaminophen (NORCO/VICODIN) 5-325 MG per tablet Take 2 tablets by mouth every 4 (four) hours as needed.  15 tablet  0  . cyclobenzaprine (FLEXERIL) 10 MG tablet Take 1 tablet (10 mg total) by mouth 2 (two) times daily as needed for muscle spasms.  20  tablet  0  . naproxen (NAPROSYN) 500 MG tablet Take 1 tablet (500 mg total) by mouth 2 (two) times daily with a meal.  30 tablet  0  . traMADol (ULTRAM) 50 MG tablet Take 1 tablet (50 mg total) by mouth every 8 (eight) hours as needed.  60 tablet  0   No current facility-administered medications on file prior to visit.   Family History  Problem Relation Age of Onset  . Hypertension Mother   . Diabetes Mother   . Cancer Father   . Hyperlipidemia Father   . Hypertension Father   . Allergies Mother   . Heart disease Mother   . Heart disease Maternal Grandmother    History   Social History  . Marital Status: Married    Spouse Name: N/A    Number of Children: 1  . Years of Education: N/A   Occupational History  . SITTER    Social History Main Topics  . Smoking status: Current Every Day Smoker -- 0.25 packs/day for 11 years    Types: Cigarettes    Last Attempt to Quit: 09/12/2012  . Smokeless tobacco: Never Used     Comment: 1 pack per week  . Alcohol Use: 4.8 oz/week    7 Glasses  of wine, 1 Cans of beer per week     Comment: 1 bottle of wine per week  . Drug Use: No  . Sexual Activity: Yes    Birth Control/ Protection: None   Other Topics Concern  . Not on file   Social History Narrative  . No narrative on file   Review of Systems  Constitutional: Negative.   Eyes: Positive for pain (sharp pain in right eyes) and discharge.  Respiratory: Positive for cough and shortness of breath.   Cardiovascular: Positive for palpitations and leg swelling. Negative for chest pain.  Gastrointestinal: Positive for abdominal pain, diarrhea (loose but painful stools) and blood in stool.  Genitourinary: Negative.   Musculoskeletal: Negative.   Skin: Negative.   Neurological: Positive for headaches.  Endo/Heme/Allergies: Negative.   Psychiatric/Behavioral: Positive for depression. The patient is nervous/anxious and has insomnia.       Objective:   Filed Vitals:   09/29/13 0937   BP: 114/79  Pulse: 86  Temp: 98.4 F (36.9 C)  Resp: 18    Physical Exam: Constitutional: Patient appears well-developed and well-nourished. No distress. HENT: Normocephalic, atraumatic, External right and left ear normal. Oropharynx is clear and moist.  Eyes: Conjunctivae and EOM are normal. PERRLA, no scleral icterus. Neck: Normal ROM. Neck supple. No JVD. No tracheal deviation. No thyromegaly. CVS: RRR, S1/S2 +, no murmurs, no gallops, no carotid bruit.  Pulmonary: Effort and breath sounds normal, no stridor, rhonchi, wheezes, rales.  Abdominal: Soft. BS +,  no distension, tenderness, rebound or guarding.  Musculoskeletal: Normal range of motion. No edema and no tenderness.  Lymphadenopathy: No lymphadenopathy noted, cervical Neuro: Alert. Normal reflexes, muscle tone coordination. No cranial nerve deficit. Skin: Skin is warm and dry. No rash noted. Not diaphoretic. No erythema. No pallor. Psychiatric: Normal mood and affect. Behavior, judgment, thought content normal.  Lab Results  Component Value Date   WBC 7.2 07/08/2013   HGB 13.8 07/08/2013   HCT 40.6 07/08/2013   MCV 92.5 07/08/2013   PLT 373 07/08/2013   Lab Results  Component Value Date   CREATININE 0.58 07/08/2013   BUN 5* 07/08/2013   NA 139 07/08/2013   K 3.5* 07/08/2013   CL 103 07/08/2013   CO2 21 07/08/2013    No results found for this basename: HGBA1C   Lipid Panel  No results found for this basename: chol, trig, hdl, cholhdl, vldl, ldlcalc       Assessment and plan:   Deshonda was seen today for hypertension and annual exam.  Diagnoses and associated orders for this visit:  Right foot pain - meloxicam (MOBIC) 7.5 MG tablet; Take 1 tablet (7.5 mg total) by mouth daily.  Eye pain, right - Ambulatory referral to Ophthalmology  Other malaise and fatigue - COMPLETE METABOLIC PANEL WITH GFR - CBC - TSH - Hemoglobin A1c - Lipid panel   Return if symptoms worsen or fail to improve.      Chari Manning, NP-C [facility] [phone] 09/29/2013, 9:55 AM

## 2013-09-29 NOTE — Progress Notes (Signed)
Pt here for blood pressure, stated legs swelling and Rt leg bruise. No energy, HA, eye pain  Diarrhea, rectal bleeding (clots) going on for 5 month (colonoscopy done 3 month ago, removed Polyp Pt stated falls 1-2 time per wk due to unbalce

## 2013-09-29 NOTE — Progress Notes (Signed)
Patient referred to social worker for PHQ-9 score of 19. Provider aware.

## 2013-09-29 NOTE — Patient Instructions (Signed)
Smoking Cessation Quitting smoking is important to your health and has many advantages. However, it is not always easy to quit since nicotine is a very addictive drug. Oftentimes, people try 3 times or more before being able to quit. This document explains the best ways for you to prepare to quit smoking. Quitting takes hard work and a lot of effort, but you can do it. ADVANTAGES OF QUITTING SMOKING  You will live longer, feel better, and live better.  Your body will feel the impact of quitting smoking almost immediately.  Within 20 minutes, blood pressure decreases. Your pulse returns to its normal level.  After 8 hours, carbon monoxide levels in the blood return to normal. Your oxygen level increases.  After 24 hours, the chance of having a heart attack starts to decrease. Your breath, hair, and body stop smelling like smoke.  After 48 hours, damaged nerve endings begin to recover. Your sense of taste and smell improve.  After 72 hours, the body is virtually free of nicotine. Your bronchial tubes relax and breathing becomes easier.  After 2 to 12 weeks, lungs can hold more air. Exercise becomes easier and circulation improves.  The risk of having a heart attack, stroke, cancer, or lung disease is greatly reduced.  After 1 year, the risk of coronary heart disease is cut in half.  After 5 years, the risk of stroke falls to the same as a nonsmoker.  After 10 years, the risk of lung cancer is cut in half and the risk of other cancers decreases significantly.  After 15 years, the risk of coronary heart disease drops, usually to the level of a nonsmoker.  If you are pregnant, quitting smoking will improve your chances of having a healthy baby.  The people you live with, especially any children, will be healthier.  You will have extra money to spend on things other than cigarettes. QUESTIONS TO THINK ABOUT BEFORE ATTEMPTING TO QUIT You may want to talk about your answers with your  health care provider.  Why do you want to quit?  If you tried to quit in the past, what helped and what did not?  What will be the most difficult situations for you after you quit? How will you plan to handle them?  Who can help you through the tough times? Your family? Friends? A health care provider?  What pleasures do you get from smoking? What ways can you still get pleasure if you quit? Here are some questions to ask your health care provider:  How can you help me to be successful at quitting?  What medicine do you think would be best for me and how should I take it?  What should I do if I need more help?  What is smoking withdrawal like? How can I get information on withdrawal? GET READY  Set a quit date.  Change your environment by getting rid of all cigarettes, ashtrays, matches, and lighters in your home, car, or work. Do not let people smoke in your home.  Review your past attempts to quit. Think about what worked and what did not. GET SUPPORT AND ENCOURAGEMENT You have a better chance of being successful if you have help. You can get support in many ways.  Tell your family, friends, and coworkers that you are going to quit and need their support. Ask them not to smoke around you.  Get individual, group, or telephone counseling and support. Programs are available at local hospitals and health centers. Call   your local health department for information about programs in your area.  Spiritual beliefs and practices may help some smokers quit.  Download a "quit meter" on your computer to keep track of quit statistics, such as how long you have gone without smoking, cigarettes not smoked, and money saved.  Get a self-help book about quitting smoking and staying off tobacco. LEARN NEW SKILLS AND BEHAVIORS  Distract yourself from urges to smoke. Talk to someone, go for a walk, or occupy your time with a task.  Change your normal routine. Take a different route to work.  Drink tea instead of coffee. Eat breakfast in a different place.  Reduce your stress. Take a hot bath, exercise, or read a book.  Plan something enjoyable to do every day. Reward yourself for not smoking.  Explore interactive web-based programs that specialize in helping you quit. GET MEDICINE AND USE IT CORRECTLY Medicines can help you stop smoking and decrease the urge to smoke. Combining medicine with the above behavioral methods and support can greatly increase your chances of successfully quitting smoking.  Nicotine replacement therapy helps deliver nicotine to your body without the negative effects and risks of smoking. Nicotine replacement therapy includes nicotine gum, lozenges, inhalers, nasal sprays, and skin patches. Some may be available over-the-counter and others require a prescription.  Antidepressant medicine helps people abstain from smoking, but how this works is unknown. This medicine is available by prescription.  Nicotinic receptor partial agonist medicine simulates the effect of nicotine in your brain. This medicine is available by prescription. Ask your health care provider for advice about which medicines to use and how to use them based on your health history. Your health care provider will tell you what side effects to look out for if you choose to be on a medicine or therapy. Carefully read the information on the package. Do not use any other product containing nicotine while using a nicotine replacement product.  RELAPSE OR DIFFICULT SITUATIONS Most relapses occur within the first 3 months after quitting. Do not be discouraged if you start smoking again. Remember, most people try several times before finally quitting. You may have symptoms of withdrawal because your body is used to nicotine. You may crave cigarettes, be irritable, feel very hungry, cough often, get headaches, or have difficulty concentrating. The withdrawal symptoms are only temporary. They are strongest  when you first quit, but they will go away within 10-14 days. To reduce the chances of relapse, try to:  Avoid drinking alcohol. Drinking lowers your chances of successfully quitting.  Reduce the amount of caffeine you consume. Once you quit smoking, the amount of caffeine in your body increases and can give you symptoms, such as a rapid heartbeat, sweating, and anxiety.  Avoid smokers because they can make you want to smoke.  Do not let weight gain distract you. Many smokers will gain weight when they quit, usually less than 10 pounds. Eat a healthy diet and stay active. You can always lose the weight gained after you quit.  Find ways to improve your mood other than smoking. FOR MORE INFORMATION  www.smokefree.gov  Document Released: 01/28/2001 Document Revised: 06/20/2013 Document Reviewed: 05/15/2011 [employer] Patient Information 2015 [employer]. This information is not intended to replace advice given to you by your health care provider. Make sure you discuss any questions you have with your health care provider.  

## 2013-09-30 LAB — LIPID PANEL
Cholesterol: 170 mg/dL (ref 0–200)
HDL: 33 mg/dL — ABNORMAL LOW (ref >39)
LDL Cholesterol: 93 mg/dL (ref 0–99)
Total CHOL/HDL Ratio: 5.2 Ratio
Triglycerides: 219 mg/dL — ABNORMAL HIGH (ref <150)
VLDL: 44 mg/dL — ABNORMAL HIGH (ref 0–40)

## 2013-09-30 LAB — COMPLETE METABOLIC PANEL WITH GFR
ALT: 146 U/L — ABNORMAL HIGH (ref 0–35)
AST: 126 U/L — ABNORMAL HIGH (ref 0–37)
Albumin: 3.9 g/dL (ref 3.5–5.2)
Alkaline Phosphatase: 66 U/L (ref 39–117)
BUN: 6 mg/dL (ref 6–23)
CO2: 21 mEq/L (ref 19–32)
Calcium: 9.3 mg/dL (ref 8.4–10.5)
Chloride: 103 mEq/L (ref 96–112)
Creat: 0.6 mg/dL (ref 0.50–1.10)
GFR, Est African American: >89 mL/min
GFR, Est Non African American: >89 mL/min
Glucose, Bld: 94 mg/dL (ref 70–99)
Potassium: 4.4 mEq/L (ref 3.5–5.3)
Sodium: 136 mEq/L (ref 135–145)
Total Bilirubin: 0.4 mg/dL (ref 0.2–1.2)
Total Protein: 6.8 g/dL (ref 6.0–8.3)

## 2013-09-30 LAB — TSH: TSH: 0.841 u[IU]/mL (ref 0.350–4.500)

## 2013-10-10 ENCOUNTER — Telehealth: Payer: Self-pay | Admitting: Internal Medicine

## 2013-10-10 ENCOUNTER — Emergency Department (HOSPITAL_COMMUNITY): Payer: No Typology Code available for payment source

## 2013-10-10 ENCOUNTER — Telehealth: Payer: Self-pay | Admitting: *Deleted

## 2013-10-10 ENCOUNTER — Emergency Department (HOSPITAL_COMMUNITY)
Admission: EM | Admit: 2013-10-10 | Discharge: 2013-10-10 | Disposition: A | Discharge status: Home / Self Care | Payer: No Typology Code available for payment source | Attending: Emergency Medicine | Admitting: Emergency Medicine

## 2013-10-10 ENCOUNTER — Encounter (HOSPITAL_COMMUNITY): Payer: Self-pay | Admitting: Emergency Medicine

## 2013-10-10 DIAGNOSIS — Z8719 Personal history of other diseases of the digestive system: Secondary | ICD-10-CM | POA: Insufficient documentation

## 2013-10-10 DIAGNOSIS — Z85850 Personal history of malignant neoplasm of thyroid: Secondary | ICD-10-CM | POA: Insufficient documentation

## 2013-10-10 DIAGNOSIS — Z0000 Encounter for general adult medical examination without abnormal findings: Secondary | ICD-10-CM

## 2013-10-10 DIAGNOSIS — Z8679 Personal history of other diseases of the circulatory system: Secondary | ICD-10-CM | POA: Insufficient documentation

## 2013-10-10 DIAGNOSIS — Z87828 Personal history of other (healed) physical injury and trauma: Secondary | ICD-10-CM | POA: Insufficient documentation

## 2013-10-10 DIAGNOSIS — E119 Type 2 diabetes mellitus without complications: Secondary | ICD-10-CM | POA: Insufficient documentation

## 2013-10-10 DIAGNOSIS — F17200 Nicotine dependence, unspecified, uncomplicated: Secondary | ICD-10-CM | POA: Insufficient documentation

## 2013-10-10 DIAGNOSIS — Z862 Personal history of diseases of the blood and blood-forming organs and certain disorders involving the immune mechanism: Secondary | ICD-10-CM | POA: Insufficient documentation

## 2013-10-10 DIAGNOSIS — Z8659 Personal history of other mental and behavioral disorders: Secondary | ICD-10-CM | POA: Insufficient documentation

## 2013-10-10 DIAGNOSIS — Z880 Allergy status to penicillin: Secondary | ICD-10-CM | POA: Insufficient documentation

## 2013-10-10 DIAGNOSIS — Z9889 Other specified postprocedural states: Secondary | ICD-10-CM | POA: Insufficient documentation

## 2013-10-10 DIAGNOSIS — Z79899 Other long term (current) drug therapy: Secondary | ICD-10-CM | POA: Insufficient documentation

## 2013-10-10 DIAGNOSIS — R1013 Epigastric pain: Secondary | ICD-10-CM | POA: Insufficient documentation

## 2013-10-10 DIAGNOSIS — Z8543 Personal history of malignant neoplasm of ovary: Secondary | ICD-10-CM | POA: Insufficient documentation

## 2013-10-10 DIAGNOSIS — Z90710 Acquired absence of both cervix and uterus: Secondary | ICD-10-CM | POA: Insufficient documentation

## 2013-10-10 DIAGNOSIS — Z9089 Acquired absence of other organs: Secondary | ICD-10-CM | POA: Insufficient documentation

## 2013-10-10 DIAGNOSIS — J45909 Unspecified asthma, uncomplicated: Secondary | ICD-10-CM | POA: Insufficient documentation

## 2013-10-10 DIAGNOSIS — R197 Diarrhea, unspecified: Secondary | ICD-10-CM | POA: Insufficient documentation

## 2013-10-10 DIAGNOSIS — R110 Nausea: Secondary | ICD-10-CM

## 2013-10-10 HISTORY — DX: Unsteadiness on feet: R26.81

## 2013-10-10 HISTORY — DX: Anxiety disorder, unspecified: F41.9

## 2013-10-10 HISTORY — DX: Noninfective gastroenteritis and colitis, unspecified: K52.9

## 2013-10-10 HISTORY — DX: Irritable bowel syndrome, unspecified: K58.9

## 2013-10-10 LAB — CBC WITH DIFFERENTIAL/PLATELET
Basophils Absolute: 0 10*3/uL (ref 0.0–0.1)
Basophils Relative: 0 % (ref 0–1)
Eosinophils Absolute: 0.3 10*3/uL (ref 0.0–0.7)
Eosinophils Relative: 3 % (ref 0–5)
HCT: 42.1 % (ref 36.0–46.0)
Hemoglobin: 14.6 g/dL (ref 12.0–15.0)
Lymphocytes Relative: 40 % (ref 12–46)
Lymphs Abs: 3.7 10*3/uL (ref 0.7–4.0)
MCH: 32.2 pg (ref 26.0–34.0)
MCHC: 34.7 g/dL (ref 30.0–36.0)
MCV: 92.7 fL (ref 78.0–100.0)
Monocytes Absolute: 0.7 10*3/uL (ref 0.1–1.0)
Monocytes Relative: 7 % (ref 3–12)
Neutro Abs: 4.6 10*3/uL (ref 1.7–7.7)
Neutrophils Relative %: 50 % (ref 43–77)
Platelets: 425 10*3/uL — ABNORMAL HIGH (ref 150–400)
RBC: 4.54 MIL/uL (ref 3.87–5.11)
RDW: 12 % (ref 11.5–15.5)
WBC: 9.3 10*3/uL (ref 4.0–10.5)

## 2013-10-10 LAB — URINALYSIS, ROUTINE W REFLEX MICROSCOPIC
Bilirubin Urine: NEGATIVE
Glucose, UA: NEGATIVE mg/dL
Hgb urine dipstick: NEGATIVE
Ketones, ur: NEGATIVE mg/dL
Leukocytes, UA: NEGATIVE
Nitrite: NEGATIVE
Protein, ur: NEGATIVE mg/dL
Specific Gravity, Urine: 1.018 (ref 1.005–1.030)
Urobilinogen, UA: 1 mg/dL (ref 0.0–1.0)
pH: 5.5 (ref 5.0–8.0)

## 2013-10-10 LAB — COMPREHENSIVE METABOLIC PANEL
ALT: 73 U/L — ABNORMAL HIGH (ref 0–35)
AST: 42 U/L — ABNORMAL HIGH (ref 0–37)
Albumin: 3.3 g/dL — ABNORMAL LOW (ref 3.5–5.2)
Alkaline Phosphatase: 75 U/L (ref 39–117)
Anion gap: 14 (ref 5–15)
BUN: 9 mg/dL (ref 6–23)
CO2: 22 mEq/L (ref 19–32)
Calcium: 9.7 mg/dL (ref 8.4–10.5)
Chloride: 103 mEq/L (ref 96–112)
Creatinine, Ser: 0.59 mg/dL (ref 0.50–1.10)
GFR calc Af Amer: >90 mL/min (ref >90)
GFR calc non Af Amer: >90 mL/min (ref >90)
Glucose, Bld: 93 mg/dL (ref 70–99)
Potassium: 4 mEq/L (ref 3.7–5.3)
Sodium: 139 mEq/L (ref 137–147)
Total Bilirubin: 0.3 mg/dL (ref 0.3–1.2)
Total Protein: 7.4 g/dL (ref 6.0–8.3)

## 2013-10-10 LAB — LIPASE, BLOOD: Lipase: 40 U/L (ref 11–59)

## 2013-10-10 MED ORDER — DICYCLOMINE HCL 20 MG PO TABS: 20.0000 mg | ORAL_TABLET | Freq: Four times a day (QID) | ORAL | Status: DC | PRN

## 2013-10-10 MED ORDER — SODIUM CHLORIDE 0.9 % IV BOLUS (SEPSIS)
500.0000 mL | Freq: Once | INTRAVENOUS | Status: AC
  Administered 2013-10-10: 500 mL via INTRAVENOUS

## 2013-10-10 MED ORDER — FAMOTIDINE IN NACL 20-0.9 MG/50ML-% IV SOLN
20.0000 mg | Freq: Once | INTRAVENOUS | Status: AC
  Administered 2013-10-10: 20 mg via INTRAVENOUS
  Filled 2013-10-10: qty 50

## 2013-10-10 MED ORDER — MORPHINE SULFATE 4 MG/ML IJ SOLN
4.0000 mg | INTRAMUSCULAR | Status: AC | PRN
  Administered 2013-10-10 (×2): 4 mg via INTRAVENOUS
  Filled 2013-10-10 (×2): qty 1

## 2013-10-10 MED ORDER — METFORMIN HCL 500 MG PO TABS
500.0000 mg | ORAL_TABLET | Freq: Two times a day (BID) | ORAL | Status: DC
Start: 2013-10-10 — End: 2015-03-26

## 2013-10-10 MED ORDER — ONDANSETRON HCL 4 MG PO TABS: 4.0000 mg | ORAL_TABLET | Freq: Three times a day (TID) | ORAL | Status: DC | PRN

## 2013-10-10 MED ORDER — SODIUM CHLORIDE 0.9 % IV SOLN
INTRAVENOUS | Status: DC
  Administered 2013-10-10: 20:00:00 via INTRAVENOUS

## 2013-10-10 MED ORDER — DICYCLOMINE HCL 10 MG/ML IM SOLN
20.0000 mg | Freq: Once | INTRAMUSCULAR | Status: AC
  Administered 2013-10-10: 20 mg via INTRAMUSCULAR
  Filled 2013-10-10: qty 2

## 2013-10-10 MED ORDER — ONDANSETRON HCL 4 MG/2ML IJ SOLN
4.0000 mg | INTRAMUSCULAR | Status: AC | PRN
  Administered 2013-10-10 (×2): 4 mg via INTRAVENOUS
  Filled 2013-10-10 (×2): qty 2

## 2013-10-10 NOTE — Telephone Encounter (Signed)
I spoke to pt she is aware of her lab results.  She picked up her new medication today and will have a lab draw on Thursday.

## 2013-10-10 NOTE — ED Notes (Signed)
Pt drank entire sprite without issue. Still c/o pain.

## 2013-10-10 NOTE — ED Provider Notes (Signed)
CSN: [id number]     Arrival date & time 10/10/13  1910 History   First MD Initiated Contact with Patient 10/10/13 1922     Chief Complaint  Patient presents with  . Abdominal Pain      HPI Pt was seen at 1930. Per pt, c/o gradual onset and persistence of constant mid-epigastric "pain" for the past several hours. Has been associated with nausea.  Pt states her symptoms began approximately 1/2 hour after taking metformin for the first time.  Describes the pain as "sharp" and "stabbing." Pt has significant hx of chronic abd pain, chronic diarrhea, and IBS. Pt denies any change in her chronic diarrhea, no fevers, no CP/SOB, no black or blood in stools, no vomiting, no back pain.     Past Medical History  Diagnosis Date  . Asthma   . EP (ectopic pregnancy)   . Ovarian tumor   . Tumor, thyroid   . Sleep apnea   . Chronic headaches   . Irregular heartbeat   . Chronic female pelvic pain 10/11/2012  . Abnormal uterine bleeding (AUB) 10/11/2012  . Cardiac arrest 1992    during delivery  . Shortness of breath   . Anemia   . Cancer     colon CA  . Chronic diarrhea   . Gait instability   . Frequent falls   . Anxiety   . Panic attack   . IBS (irritable bowel syndrome)    Past Surgical History  Procedure Laterality Date  . Cholecystectomy  2005  . Unilateral salpingectomy  2009    Abdominal left salpingectomy  . Vaginal hysterectomy N/A 01/06/2013    Procedure: HYSTERECTOMY VAGINAL;  Surgeon: Osborne Oman, MD;  Location: [facility];  Service: Gynecology;  Laterality: N/A;  . Colonoscopy Left 04/29/2013    Procedure: COLONOSCOPY;  Surgeon: Arta Silence, MD;  Location: [facility];  Service: Endoscopy;  Laterality: Left;   Family History  Problem Relation Age of Onset  . Hypertension Mother   . Diabetes Mother   . Cancer Father   . Hyperlipidemia Father   . Hypertension Father   . Allergies Mother   . Heart disease Mother   . Heart disease Maternal Grandmother    History   Substance Use Topics  . Smoking status: Current Every Day Smoker -- 0.25 packs/day for 11 years    Types: Cigarettes    Last Attempt to Quit: 09/12/2012  . Smokeless tobacco: Never Used     Comment: 1 pack per week  . Alcohol Use: 4.8 oz/week    7 Glasses of wine, 1 Cans of beer per week     Comment: 1 bottle of wine per week   OB History   Grav Para Term Preterm Abortions TAB SAB Ect Mult Living   3 1 1  2 1  1  1      Review of Systems ROS: Statement: All systems negative except as marked or noted in the HPI; Constitutional: Negative for fever and chills. ; ; Eyes: Negative for eye pain, redness and discharge. ; ; ENMT: Negative for ear pain, hoarseness, nasal congestion, sinus pressure and sore throat. ; ; Cardiovascular: Negative for chest pain, palpitations, diaphoresis, dyspnea and peripheral edema. ; ; Respiratory: Negative for cough, wheezing and stridor. ; ; Gastrointestinal: +abd pain, nausea, chronic diarrhea. Negative for vomiting, blood in stool, hematemesis, jaundice and rectal bleeding. . ; ; Genitourinary: Negative for dysuria, flank pain and hematuria. ; ; Musculoskeletal: Negative for back pain and neck  pain. Negative for swelling and trauma.; ; Skin: Negative for pruritus, rash, abrasions, blisters, bruising and skin lesion.; ; Neuro: Negative for headache, lightheadedness and neck stiffness. Negative for weakness, altered level of consciousness , altered mental status, extremity weakness, paresthesias, involuntary movement, seizure and syncope.      Allergies  Penicillins; Shellfish allergy; and Other  Home Medications   Prior to Admission medications   Medication Sig Start Date End Date Taking? Authorizing Provider  albuterol (PROVENTIL HFA;VENTOLIN HFA) 108 (90 BASE) MCG/ACT inhaler Inhale 1-2 puffs into the lungs every 6 (six) hours as needed for wheezing or shortness of breath.   Yes Historical Provider, MD  metFORMIN (GLUCOPHAGE) 500 MG tablet Take 1 tablet (500  mg total) by mouth 2 (two) times daily with a meal. 10/10/13  Yes Lance Bosch, NP   BP 159/94  Pulse 95  Temp(Src) 98.1 F (36.7 C) (Oral)  Resp 22  SpO2 100%  LMP 11/27/2012 Physical Exam 1935: Physical examination:  Nursing notes reviewed; Vital signs and O2 SAT reviewed;  Constitutional: Well developed, Well nourished, Well hydrated, Uncomfortable appearing.; Head:  Normocephalic, atraumatic; Eyes: EOMI, PERRL, No scleral icterus; ENMT: Mouth and pharynx normal, Mucous membranes moist; Neck: Supple, Full range of motion, No lymphadenopathy; Cardiovascular: Regular rate and rhythm, No murmur, rub, or gallop; Respiratory: Breath sounds clear & equal bilaterally, No rales, rhonchi, wheezes.  Speaking full sentences with ease, Normal respiratory effort/excursion; Chest: Nontender, Movement normal; Abdomen: Soft, +mid-epigastric area tenderness to palp. No rebound or guarding. Nondistended, Normal bowel sounds; Genitourinary: No CVA tenderness; Extremities: Pulses normal, No tenderness, No edema, No calf edema or asymmetry.; Neuro: AA&Ox3, Major CN grossly intact.  Speech clear. No gross focal motor or sensory deficits in extremities.; Skin: Color normal, Warm, Dry.   ED Course  Procedures     EKG Interpretation None      MDM  MDM Reviewed: previous chart, nursing note and vitals Reviewed previous: labs Interpretation: labs and x-ray   Results for orders placed during the hospital encounter of 10/10/13  CBC WITH DIFFERENTIAL      Result Value Ref Range   WBC 9.3  4.0 - 10.5 K/uL   RBC 4.54  3.87 - 5.11 MIL/uL   Hemoglobin 14.6  12.0 - 15.0 g/dL   HCT 42.1  36.0 - 46.0 %   MCV 92.7  78.0 - 100.0 fL   MCH 32.2  26.0 - 34.0 pg   MCHC 34.7  30.0 - 36.0 g/dL   RDW 12.0  11.5 - 15.5 %   Platelets 425 (*) 150 - 400 K/uL   Neutrophils Relative % 50  43 - 77 %   Neutro Abs 4.6  1.7 - 7.7 K/uL   Lymphocytes Relative 40  12 - 46 %   Lymphs Abs 3.7  0.7 - 4.0 K/uL   Monocytes  Relative 7  3 - 12 %   Monocytes Absolute 0.7  0.1 - 1.0 K/uL   Eosinophils Relative 3  0 - 5 %   Eosinophils Absolute 0.3  0.0 - 0.7 K/uL   Basophils Relative 0  0 - 1 %   Basophils Absolute 0.0  0.0 - 0.1 K/uL  COMPREHENSIVE METABOLIC PANEL      Result Value Ref Range   Sodium 139  137 - 147 mEq/L   Potassium 4.0  3.7 - 5.3 mEq/L   Chloride 103  96 - 112 mEq/L   CO2 22  19 - 32 mEq/L   Glucose, Bld 93  70 - 99 mg/dL   BUN 9  6 - 23 mg/dL   Creatinine, Ser 0.59  0.50 - 1.10 mg/dL   Calcium 9.7  8.4 - 10.5 mg/dL   Total Protein 7.4  6.0 - 8.3 g/dL   Albumin 3.3 (*) 3.5 - 5.2 g/dL   AST 42 (*) 0 - 37 U/L   ALT 73 (*) 0 - 35 U/L   Alkaline Phosphatase 75  39 - 117 U/L   Total Bilirubin 0.3  0.3 - 1.2 mg/dL   GFR calc non Af Amer >90  >90 mL/min   GFR calc Af Amer >90  >90 mL/min   Anion gap 14  5 - 15  LIPASE, BLOOD      Result Value Ref Range   Lipase 40  11 - 59 U/L  URINALYSIS, ROUTINE W REFLEX MICROSCOPIC      Result Value Ref Range   Color, Urine YELLOW  YELLOW   APPearance CLEAR  CLEAR   Specific Gravity, Urine 1.018  1.005 - 1.030   pH 5.5  5.0 - 8.0   Glucose, UA NEGATIVE  NEGATIVE mg/dL   Hgb urine dipstick NEGATIVE  NEGATIVE   Bilirubin Urine NEGATIVE  NEGATIVE   Ketones, ur NEGATIVE  NEGATIVE mg/dL   Protein, ur NEGATIVE  NEGATIVE mg/dL   Urobilinogen, UA 1.0  0.0 - 1.0 mg/dL   Nitrite NEGATIVE  NEGATIVE   Leukocytes, UA NEGATIVE  NEGATIVE   Dg Abd Acute W/chest 10/10/2013   CLINICAL DATA:  Mid upper abdominal pain, nausea, diarrhea and shortness of breath.  EXAM: ACUTE ABDOMEN SERIES (ABDOMEN 2 VIEW & CHEST 1 VIEW)  COMPARISON:  Previous examinations.  FINDINGS: Normal sized heart. Clear lungs. Normal bowel gas pattern without free peritoneal air. There are scattered air-fluid levels in normal caliber colon. Cholecystectomy clips. Unremarkable bones.  IMPRESSION: Scattered air-fluid levels in normal caliber colon. This can be seen with gastroenteritis. Otherwise,  unremarkable examination.   Electronically Signed   By: Enrique Sack M.D.   On: 10/10/2013 20:36    Results for GESSELLE, FITZSIMONS (MRN [id number]) as of 10/10/2013 21:54  Ref. Range 09/29/2013 10:28 10/10/2013 19:29  AST Latest Range: 0-37 U/L 126 (H) 42 (H)  ALT Latest Range: 0-35 U/L 146 (H) 73 (H)     2150:  LFT's improved from previous. Pt is already s/p cholecystectomy and NT RUQ on exam. Pt has tol PO well while in the ED without N/V.  No stooling while in the ED.  Abd benign, VSS. Feels better and wants to go home now. Dx and testing d/w pt and family.  Questions answered.  Verb understanding, agreeable to d/c home with outpt f/u.    Francine Graven, DO 10/13/13 1348

## 2013-10-10 NOTE — ED Notes (Signed)
Pt ambulatory to bathroom

## 2013-10-10 NOTE — Discharge Instructions (Signed)
°Emergency Department Resource Guide °1) Find a Doctor and Pay Out of Pocket °Although you won't have to find out who is covered by your insurance plan, it is a good idea to ask around and get recommendations. You will then need to call the office and see if the doctor you have chosen will accept you as a new patient and what types of options they offer for patients who are self-pay. Some doctors offer discounts or will set up payment plans for their patients who do not have insurance, but you will need to ask so you aren't surprised when you get to your appointment. ° °2) Contact Your Local Health Department °Not all health departments have doctors that can see patients for sick visits, but many do, so it is worth a call to see if yours does. If you don't know where your local health department is, you can check in your phone book. The CDC also has a tool to help you locate your state's health department, and many state websites also have listings of all of their local health departments. ° °3) Find a Walk-in Clinic °If your illness is not likely to be very severe or complicated, you may want to try a walk in clinic. These are popping up all over the country in pharmacies, drugstores, and shopping centers. They're usually staffed by nurse practitioners or physician assistants that have been trained to treat common illnesses and complaints. They're usually fairly quick and inexpensive. However, if you have serious medical issues or chronic medical problems, these are probably not your best option. ° °No Primary Care Doctor: °- Call [facility] at  832-8000 - they can help you locate a primary care doctor that  accepts your insurance, provides certain services, etc. °- Physician Referral Service- 1-800-533-3463 ° °Chronic Pain Problems: °Organization         Address  Phone   Notes  °[facility]  (336) 297-2271 Patients need to be referred by their primary care doctor.  ° °Medication  Assistance: °Organization         Address  Phone   Notes  °[place] [facility] [address] °[place], [place] [address] (336) 641-8030 --Must be a resident of [place] °-- Must have NO insurance coverage whatsoever (no Medicaid/ Medicare, etc.) °-- The pt. MUST have a primary care doctor that directs their care regularly and follows them in the community °  °[facility]  (866) 331-1348   °[employer]  (888) 892-1162   ° °Agencies that provide inexpensive medical care: °Organization         Address  Phone   Notes  °[facility]  (336) 832-8035   °[facility]    (336) 832-7272   °[facility] [address] °[place], [place] [address] (336) 832-4777   °[facility] [address], °[place] (336) 271-4999   °[facility]    (336) 373-0678   °[facility]    (336) 272-1050   °[facility] ° [address], [place] Phone:  (336) 832-4444, Fax:  (336) 832-4440 Hours of Operation:  9 am - 6 pm, M-F.  Also accepts Medicaid/Medicare and self-pay.  °[facility] ° [address],  Phone: (336) 832-3150, Fax: (336) 832-3151. Hours of Operation:  8:30 am - 5:30 pm, M-F.  Also accepts Medicaid and self-pay.  °[facility] [place] [address], [place] Phone: (336) 878-6027   °[facility] [address], [place], [place] (336)723-1848, Ext. 123 Mondays & Thursdays: 7-9 AM.  First 15 patients are seen on a first come, first serve basis. °  ° °Medicaid-accepting [place] Providers: ° °Organization         Address  Phone   Notes  °[facility] [address], [place] (336) 641-2100 Also accepts self-pay patients.  °[facility] [address], [place] ° (336) 856-9996   °[facility] [address], [place]  (336) 288-8857   °[facility] [address] [place] [address], [place] (336) 299-7000   °Veita Bland [address], [place]  ° (336) 373-1557 Only accepts [place] Access Medicaid patients after they have their name applied to their card.  ° °Self-Pay (no insurance) in [place]: ° °Organization         Address  Phone   Notes  °Sickle Cell Patients, [facility] [address], [place] (336) 832-1970   °[facility] [address], [place] (336) 832-4400   °[facility] [place] ° [address], [place] (336) 992-4800   °[facility]/Dr. Osei-Bonsu ° [address] [place] [address], [place] or [address], [place] (336) 841-8500 Phone number for both [place] and [place] locations is the same.  °[facility] [address], [place] (336) 299-0000   °[facility] [address] [place] [address], [place] or [address] (336) 852-7530 °(336) 878-2260   °[facility] [address], [place] (336) 350-1642, phone; (336) 294-5005, fax Sees patients 1st and 3rd Saturday of every month.  Must not qualify for public or private insurance (i.e. Medicaid, Medicare, [place] Health Choice, Veterans' Benefits) • Household income should be no more than 200% of the poverty level •The clinic cannot treat you if you are pregnant or think you are pregnant • Sexually transmitted diseases are not treated at the clinic.  ° ° °Dental Care: °Organization         Address  Phone  Notes  °[place] [facility] [address], [place] (336) 641-6152 Accepts children up to age 21 who are enrolled in Medicaid or [place] Health Choice; pregnant women with a Medicaid card; and children who have applied for Medicaid or [place] Health Choice, but were declined, whose parents can pay a reduced fee at time of service.  °[place]  [facility] [place]  [address], [place] (336) 641-7733 Accepts children up to age 21 who are enrolled in Medicaid or [place] Health Choice; pregnant women with a Medicaid card; and children who have applied for Medicaid or  Health Choice, but were declined, whose parents can pay a reduced fee at time of service.  °[facility] ° [address], [place] (336) 641-4533 Patients are seen by appointment only. Walk-ins are not accepted. [facility] will see patients 18 years of age and older. °Monday - Tuesday (8am-5pm) °Most Wednesdays (8:30-5pm) °$30 per visit, cash only  °[facility] ° [address], [place] (336) 641-4533 Patients are seen by appointment only. Walk-ins are not accepted. [facility] will see patients 18 years of age and older. °One   Wednesday Evening (Monthly: Volunteer Based).  $30 per visit, cash only  °[facility]  (919) 537-3737 for adults; Children under age 4, call Graduate Pediatric Dentistry at (919) [phone]. Children aged 4-14, please call (919) [phone] to request a pediatric application. ° Dental services are provided in all areas of dental care including fillings, crowns and bridges, complete and partial dentures, implants, gum treatment, root canals, and extractions. Preventive care is also provided. Treatment is provided to both adults and children. °Patients are selected via a lottery and there is often a waiting list. °  °[facility] [address], °[place] ° (336) 763-8833 [email or web address].drcivils.[email or web address] °  °[facility] [address], [place], [place] (336)723-1848, Ext. 123 Second and Fourth Thursday of each month, opens at 6:30 AM; Clinic ends at 9 AM.  Patients are seen on a first-come first-served basis, and a limited number are seen during each clinic.  ° °[facility] ° [address], [place], [place] (336) 723-7904    Eligibility Requirements °You must have lived in [place], or [place] counties for at least the last three months. °  You cannot be eligible for state or federal sponsored healthcare insurance, including Veterans Administration, Medicaid, or Medicare. °  You generally cannot be eligible for healthcare insurance through your employer.  °  How to apply: °Eligibility screenings are held every Tuesday and Wednesday afternoon from 1:00 pm until 4:00 pm. You do not need an appointment for the interview!  °[facility] [address], [place], [place] 336-631-2330   °[facility]  336-342-8273   °[facility]  336-703-3100   °[facility]  336-570-6415   ° °Behavioral Health Resources in the Community: °Intensive Outpatient Programs °Organization         Address  Phone  Notes  °[facility] [address], [place], [place] 336-878-6098   °[facility] [address], [place], [place] 336-832-9800   °[facility] [address], [place], [place] ° 336-882-2125   °[facility] [address],  °[place], [place] 1-800-853-5163 or 336-641-4981   °Substance Abuse Resources °Organization         Address  Phone  Notes  °[facility]  336-882-2125   °[facility]  336-784-9470   °[facility]  336-285-9073   °[facility]  336-845-3988   °Residential & Outpatient Substance Abuse Program  1-800-659-3381   °Psychological Services °Organization         Address  Phone  Notes  °[facility]  336- 832-9600   °[facility]  336- 378-7881   °[facility] [address], [place] 1-800-853-5163 or 336-641-4981   ° °Mobile Crisis Teams °Organization         Address  Phone  Notes  °[facility], Mobile Crisis Care Unit  1-877-626-1772   °Assertive °[facility] ° [address]  [place], [place] 336-834-9664   °Sharon DeEsch [address] °[place] 336-554-5454   ° °Self-Help/Support Groups °Organization         Address  Phone             Notes  °[facility] [place] - variety of support groups  336- 373-1402 Call for more information  °Narcotics Anonymous (NA), [facility] [address], °[place]  2 meetings at this location  ° °  Residential Treatment Programs Organization         Address  Phone  Notes  [facility] [address]    [place]  1-(716)373-6070   [facility]  [address] [place] [address], [place], [place]   [place], [place] 617-478-1438 Admissions: 8am-3pm M-F  [facility] [place] [address],    [place], [place] 269-485-4627   [facility] [address] [place], [place], [place]   [facility] [address]  [place], [place]   [facility] - Intensive Outpatient [address], [place], [place]   [facility]  [place], [place] 1-986-729-8832 or (619) 560-5660   [facility] [address], [place], [place] Accepts Medicaid  [facility] [address],  [place] 1-304-199-2254 Substance Abuse/Addiction Treatment   [facility]         Address  Phone  Notes  [facility]  567-206-5519   Domenic Schwab, PhD [address] [place], [place]   [phone] or (757) 860-8688   [place], [place] 512-634-7657   [facility] [address], [place], [place] (609)781-7297 Insurance/Medicaid/sponsorship through [facility] and Families [address]                                    [place], [place] (507)[phone] [place] [address] [place] [address], [place] [phone]    Dr. Adele Schilder  [phone]   [facility] Dept. 1) [address] [place] 2) [place] 3)  [address], [place] [phone] [phone]  [phone]   [place] [phone] or [phone] (After Hours)       Take the prescriptions as directed.  Increase your fluid intake (ie:  Gatoraide) for the next few days.  Eat a bland diet and advance to your regular diet slowly as you can tolerate it.   Avoid full strength juices, as well as milk and milk products until your diarrhea has resolved.   Call your regular medical doctor tomorrow morning to schedule a follow up appointment this week.  Return to the Emergency Department immediately if not improving (or even worsening) despite taking the medicines as prescribed, any black or bloody stool or vomit, if you develop a fever over "101," or for any other concerns.

## 2013-10-10 NOTE — ED Notes (Signed)
Per EMS,  Pt coming from home c/o severe abdominal pain, LUQ. Pain increses with palpation and movement. Pt also c/o nausea and dizziness. Pain started about an hour ago after taking her new prescription metformin. Pt had not previously been on any medication. Pt also c/o L sided flank pain, described as sharp and stabbing 10/10 pain. Pt sts that a week ago she went to see a Dr. For liver tests due to jaundice. Pt has follow up on Thursday. A&Ox4.

## 2013-10-10 NOTE — ED Notes (Signed)
Bed: WA21 Expected date:  Expected time:  Means of arrival:  Comments: EMS- abdominal pain, new DM medication

## 2013-10-10 NOTE — Telephone Encounter (Signed)
Pt. Calling about lab results. Please f/u with pt.

## 2013-10-13 ENCOUNTER — Ambulatory Visit: Payer: No Typology Code available for payment source | Attending: Internal Medicine

## 2013-10-13 ENCOUNTER — Telehealth: Payer: Self-pay | Admitting: Emergency Medicine

## 2013-10-13 ENCOUNTER — Encounter: Payer: Self-pay | Admitting: Internal Medicine

## 2013-10-13 ENCOUNTER — Telehealth: Payer: Self-pay | Admitting: Internal Medicine

## 2013-10-13 ENCOUNTER — Other Ambulatory Visit: Payer: No Typology Code available for payment source

## 2013-10-13 ENCOUNTER — Other Ambulatory Visit: Payer: Self-pay | Admitting: Internal Medicine

## 2013-10-13 DIAGNOSIS — E119 Type 2 diabetes mellitus without complications: Secondary | ICD-10-CM

## 2013-10-13 DIAGNOSIS — Z0000 Encounter for general adult medical examination without abnormal findings: Secondary | ICD-10-CM

## 2013-10-13 HISTORY — DX: Type 2 diabetes mellitus without complications: E11.9

## 2013-10-13 MED ORDER — GLUCOSE BLOOD VI STRP: ORAL_STRIP | Status: DC

## 2013-10-13 MED ORDER — FREESTYLE SYSTEM KIT: 1.0000 | PACK | Status: DC | PRN

## 2013-10-13 MED ORDER — FREESTYLE LANCETS MISC: Status: DC

## 2013-10-13 NOTE — Telephone Encounter (Signed)
Patient has come in today for Labs; patient has expressed concern about her diabetes medication and how it has made her feel sick; please f/u with patient by phone or lobby

## 2013-10-13 NOTE — Telephone Encounter (Signed)
Left message for pt to call when message received 

## 2013-10-14 ENCOUNTER — Telehealth: Payer: Self-pay | Admitting: Emergency Medicine

## 2013-10-14 LAB — HEPATITIS PANEL, ACUTE
HCV Ab: NEGATIVE
Hep A IgM: NONREACTIVE
Hep B C IgM: NONREACTIVE
Hepatitis B Surface Ag: NEGATIVE

## 2013-10-14 NOTE — Telephone Encounter (Signed)
Left message for pt to call when message received 

## 2013-10-14 NOTE — Telephone Encounter (Signed)
Message copied by Ricci Barker on Fri Oct 14, 2013  4:34 PM ------      Message from: Chari Manning A      Created: Fri Oct 14, 2013  3:02 PM       Call patient and let her know that she was negative for Hepatitis.  Let her know that the liver function may have been off due to excess alcohol intake. Find out if she has been doing better with the Metformin-as far as nausea and abdominal pain. ------

## 2013-10-27 ENCOUNTER — Ambulatory Visit: Payer: No Typology Code available for payment source | Admitting: Internal Medicine

## 2013-11-08 ENCOUNTER — Other Ambulatory Visit: Payer: Self-pay | Admitting: Gastroenterology

## 2013-11-08 NOTE — Addendum Note (Signed)
Addended by: Arta Silence on: 11/08/2013 05:12 PM   Modules accepted: Orders

## 2013-11-09 ENCOUNTER — Ambulatory Visit (HOSPITAL_COMMUNITY)
Admission: RE | Admit: 2013-11-09 | Discharge: 2013-11-09 | Disposition: A | Discharge status: Home / Self Care | Payer: No Typology Code available for payment source | Source: Ambulatory Visit | Attending: Gastroenterology | Admitting: Gastroenterology

## 2013-11-09 ENCOUNTER — Encounter (HOSPITAL_COMMUNITY): Payer: Self-pay | Admitting: *Deleted

## 2013-11-09 ENCOUNTER — Encounter (HOSPITAL_COMMUNITY)
Admission: RE | Disposition: A | Discharge status: Home / Self Care | Payer: Self-pay | Source: Ambulatory Visit | Attending: Gastroenterology

## 2013-11-09 DIAGNOSIS — K5730 Diverticulosis of large intestine without perforation or abscess without bleeding: Secondary | ICD-10-CM | POA: Insufficient documentation

## 2013-11-09 DIAGNOSIS — K5900 Constipation, unspecified: Secondary | ICD-10-CM | POA: Insufficient documentation

## 2013-11-09 DIAGNOSIS — K921 Melena: Secondary | ICD-10-CM | POA: Insufficient documentation

## 2013-11-09 HISTORY — PX: FLEXIBLE SIGMOIDOSCOPY: SHX5431

## 2013-11-09 LAB — GLUCOSE, CAPILLARY: Glucose-Capillary: 92 mg/dL (ref 70–99)

## 2013-11-09 SURGERY — SIGMOIDOSCOPY, FLEXIBLE
Anesthesia: Moderate Sedation

## 2013-11-09 MED ORDER — DIPHENHYDRAMINE HCL 50 MG/ML IJ SOLN
INTRAMUSCULAR | Status: AC
  Filled 2013-11-09: qty 1

## 2013-11-09 MED ORDER — MIDAZOLAM HCL 10 MG/2ML IJ SOLN
INTRAMUSCULAR | Status: AC
  Filled 2013-11-09: qty 4

## 2013-11-09 MED ORDER — SODIUM CHLORIDE 0.9 % IV SOLN: INTRAVENOUS | Status: DC

## 2013-11-09 MED ORDER — FENTANYL CITRATE 0.05 MG/ML IJ SOLN
INTRAMUSCULAR | Status: AC
  Filled 2013-11-09: qty 4

## 2013-11-09 MED ORDER — FENTANYL CITRATE 0.05 MG/ML IJ SOLN
INTRAMUSCULAR | Status: DC | PRN
  Administered 2013-11-09: 25 ug via INTRAVENOUS

## 2013-11-09 MED ORDER — MIDAZOLAM HCL 10 MG/2ML IJ SOLN
INTRAMUSCULAR | Status: DC | PRN
  Administered 2013-11-09: 2 mg via INTRAVENOUS
  Administered 2013-11-09: 1 mg via INTRAVENOUS

## 2013-11-09 NOTE — Discharge Instructions (Signed)
Flexible Sigmoidoscopy  Post procedure instructions:  Read the instructions outlined below and refer to this sheet in the next few weeks. These discharge instructions provide you with general information on caring for yourself after you leave the hospital. Your doctor may also give you specific instructions. While your treatment has been planned according to the most current medical practices available, unavoidable complications occasionally occur. If you have any problems or questions after discharge, call Dr. Paulita Fujita at [facility] [phone]).  HOME CARE INSTRUCTIONS  ACTIVITY:  You may resume your regular activity, but move at a slower pace for the next 24 hours.   Take frequent rest periods for the next 24 hours.   Walking will help get rid of the air and reduce the bloated feeling in your belly (abdomen).   No driving for 24 hours (because of the medicine (anesthesia) used during the test).   You may shower.   Do not sign any important legal documents or operate any machinery for 24 hours (because of the anesthesia used during the test).  NUTRITION:  Drink plenty of fluids.   You may resume your normal diet as instructed by your doctor.   Begin with a light meal and progress to your normal diet. Heavy or fried foods are harder to digest and may make you feel sick to your stomach (nauseated).   Avoid alcoholic beverages for 24 hours or as instructed.  MEDICATIONS:  You may resume your normal medications unless your doctor tells you otherwise.  WHAT TO EXPECT TODAY:  Some feelings of bloating in the abdomen.   Passage of more gas than usual.   Spotting of blood in your stool or on the toilet paper.  IF YOU HAD POLYPS REMOVED DURING THE COLONOSCOPY:  No aspirin products for 7 days or as instructed.   No alcohol for 7 days or as instructed.   Eat a soft diet for the next 24 hours.   FINDING OUT THE RESULTS OF YOUR TEST  Not all test results are available  during your visit. If your test results are not back during the visit, make an appointment with your caregiver to find out the results. Do not assume everything is normal if you have not heard from your caregiver or the medical facility. It is important for you to follow up on all of your test results.     SEEK IMMEDIATE MEDICAL CARE IF:   You have more than a spotting of blood in your stool.   Your belly is swollen (abdominal distention).   You are nauseated or vomiting.   You have a fever.   You have abdominal pain or discomfort that is severe or gets worse throughout the day.    Document Released: 09/18/2003 Document Revised: 10/16/2010 Document Reviewed: 09/16/2007 [facility] Patient Information 2012 [place].

## 2013-11-09 NOTE — Op Note (Signed)
[facility] [place], [address]   FLEXIBLE SIGMOIDOSCOPY PROCEDURE REPORT     EXAM DATE: 11/09/2013   PATIENT NAME:      Marisa [name], Marisa [name]           MR #:      [id number]  BIRTHDATE:       [birth year]      VISIT #:     [phone]  ATTENDING:     Arta Silence, MD     STATUS:     outpatient ASSISTANT:      Verlon Au and Corliss Parish REFERRING:  [facility]  INDICATIONS:  The patient is a 38 yr old female here for a colonoscopy due to hematochezia, dyschezia. PROCEDURE PERFORMED:     Sigmoidoscopy, diagnostic MEDICATIONS:     Fentanyl 25 mcg IV and Versed 3 mg IV :     None  CONSENT: The patient understands the risks and benefits of the procedure and understands that these risks include, but are not limited to: sedation, allergic reaction, infection, perforation and/or bleeding. Alternative means of evaluation and treatment include, among others: physical exam, x-rays, and/or surgical intervention. The patient elects to proceed with this endoscopic procedure.  DESCRIPTION OF PROCEDURE: Informd  consent was verified, confirmed and timeout was successfully executed by the treatment team. A digital rectal exam showed tenderness of anterior anal wall; no palpable mass noted; sphincter tone normal.     The Pentax Gastroscope [id number] endoscope was introduced through the anus and advanced to the splenic flexure. The overall prep quality was fair.. The instrument was then slowly withdrawn as the colon was fully examined. The scope was then completely withdrawn from the patient and the procedure terminated.    FINDINGS:  Anterior anal canal tenderness; there was suggestion of focal erythema in this region upon slow withdrawal of the gastroscope through the anal canal.  On digital rectal exam, and upon retroflexed views into the rectum, I did not see any significant hemorrhoids.  Remainder of sigmoidoscopy to the level of  the splenic flexure was normal except for a few diverticula. w IMPRESSION:  As above.  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  RECOMMENDATIONS:     1.  Watch for potential complications of procedure. 2.  Stool softeners, high fiber diet, Sitz baths. 3.   Will try to get Rectiv for her; if unable to get Rectiv (cost issues) or if symptoms persist despite above conservative measures, would need to consider surgical referral.   _____________________________ Arta Silence, MD eSigned:  Arta Silence, MD 11/09/2013 10:30 AM  cc:     .

## 2013-11-09 NOTE — H&P (Signed)
Patient interval history reviewed.  Patient examined again.  There has been no change from documented H/P dated 11/08/13 (scanned into chart from our office) except as documented above.  Assessment:  1.  Persistent dyschezia and hematochezia.  Unrevealing colonoscopy in March 2015.  Plan:  1.  Flexible sigmoidoscopy. 2.  Risks (bleeding, infection, bowel perforation that could require surgery, sedation-related changes in cardiopulmonary systems), benefits (identification and possible treatment of source of symptoms, exclusion of certain causes of symptoms), and alternatives (watchful waiting, radiographic imaging studies, empiric medical treatment) of flexible sigmoidoscopy were explained to patient/family in detail and patient wishes to proceed.

## 2013-11-10 ENCOUNTER — Encounter (HOSPITAL_COMMUNITY): Payer: Self-pay | Admitting: Gastroenterology

## 2013-12-13 NOTE — Telephone Encounter (Signed)
Message from: Chari Manning A  Created: Fri Oct 14, 2013 3:02 PM  Call patient and let her know that she was negative for Hepatitis. Let her know that the liver function may have been off due to excess alcohol intake. Find out if she has been doing better with the Metformin-as far as nausea and abdominal pain.

## 2013-12-15 ENCOUNTER — Other Ambulatory Visit: Payer: Self-pay | Admitting: Emergency Medicine

## 2013-12-15 DIAGNOSIS — K603 Anal fistula: Secondary | ICD-10-CM

## 2013-12-19 ENCOUNTER — Encounter (HOSPITAL_COMMUNITY): Payer: Self-pay | Admitting: Gastroenterology

## 2014-01-31 ENCOUNTER — Telehealth: Payer: Self-pay | Admitting: Internal Medicine

## 2014-01-31 ENCOUNTER — Ambulatory Visit: Payer: 59 | Admitting: Internal Medicine

## 2014-01-31 NOTE — Telephone Encounter (Signed)
Pt reflects NO SHOW status for todays appt to establish care. Please advise.

## 2014-02-16 ENCOUNTER — Encounter (HOSPITAL_COMMUNITY): Payer: Self-pay | Admitting: *Deleted

## 2014-02-16 ENCOUNTER — Emergency Department (HOSPITAL_COMMUNITY)
Admission: EM | Admit: 2014-02-16 | Discharge: 2014-02-16 | Disposition: A | Discharge status: Home / Self Care | Payer: 59 | Attending: Emergency Medicine | Admitting: Emergency Medicine

## 2014-02-16 DIAGNOSIS — Z8719 Personal history of other diseases of the digestive system: Secondary | ICD-10-CM | POA: Insufficient documentation

## 2014-02-16 DIAGNOSIS — Z8742 Personal history of other diseases of the female genital tract: Secondary | ICD-10-CM | POA: Insufficient documentation

## 2014-02-16 DIAGNOSIS — Z8679 Personal history of other diseases of the circulatory system: Secondary | ICD-10-CM | POA: Diagnosis not present

## 2014-02-16 DIAGNOSIS — J45909 Unspecified asthma, uncomplicated: Secondary | ICD-10-CM | POA: Diagnosis not present

## 2014-02-16 DIAGNOSIS — Z85038 Personal history of other malignant neoplasm of large intestine: Secondary | ICD-10-CM | POA: Diagnosis not present

## 2014-02-16 DIAGNOSIS — Z79899 Other long term (current) drug therapy: Secondary | ICD-10-CM | POA: Insufficient documentation

## 2014-02-16 DIAGNOSIS — G8929 Other chronic pain: Secondary | ICD-10-CM | POA: Insufficient documentation

## 2014-02-16 DIAGNOSIS — Z86018 Personal history of other benign neoplasm: Secondary | ICD-10-CM | POA: Insufficient documentation

## 2014-02-16 DIAGNOSIS — Z720 Tobacco use: Secondary | ICD-10-CM | POA: Insufficient documentation

## 2014-02-16 DIAGNOSIS — Z862 Personal history of diseases of the blood and blood-forming organs and certain disorders involving the immune mechanism: Secondary | ICD-10-CM | POA: Insufficient documentation

## 2014-02-16 DIAGNOSIS — E119 Type 2 diabetes mellitus without complications: Secondary | ICD-10-CM | POA: Diagnosis not present

## 2014-02-16 DIAGNOSIS — Z8659 Personal history of other mental and behavioral disorders: Secondary | ICD-10-CM | POA: Diagnosis not present

## 2014-02-16 DIAGNOSIS — Z880 Allergy status to penicillin: Secondary | ICD-10-CM | POA: Diagnosis not present

## 2014-02-16 DIAGNOSIS — Z9181 History of falling: Secondary | ICD-10-CM | POA: Insufficient documentation

## 2014-02-16 DIAGNOSIS — G4730 Sleep apnea, unspecified: Secondary | ICD-10-CM | POA: Diagnosis not present

## 2014-02-16 DIAGNOSIS — J039 Acute tonsillitis, unspecified: Secondary | ICD-10-CM | POA: Diagnosis not present

## 2014-02-16 DIAGNOSIS — R51 Headache: Secondary | ICD-10-CM | POA: Diagnosis present

## 2014-02-16 MED ORDER — HYDROCODONE-ACETAMINOPHEN 7.5-325 MG/15ML PO SOLN
10.0000 mL | ORAL | Status: DC | PRN
Start: 2014-02-16 — End: 2014-02-23

## 2014-02-16 MED ORDER — HYDROCODONE-ACETAMINOPHEN 7.5-325 MG/15ML PO SOLN
10.0000 mL | Freq: Once | ORAL | Status: AC
Start: 2014-02-16 — End: 2014-02-16
  Administered 2014-02-16: 10 mL via ORAL
  Filled 2014-02-16: qty 15

## 2014-02-16 MED ORDER — AZITHROMYCIN 250 MG PO TABS: 250.0000 mg | ORAL_TABLET | Freq: Every day | ORAL | Status: DC

## 2014-02-16 MED ORDER — AZITHROMYCIN 250 MG PO TABS
500.0000 mg | ORAL_TABLET | Freq: Once | ORAL | Status: AC
  Administered 2014-02-16: 500 mg via ORAL
  Filled 2014-02-16: qty 2

## 2014-02-16 NOTE — Discharge Instructions (Signed)
Salt Water Gargle This solution will help make your mouth and throat feel better. HOME CARE INSTRUCTIONS   Mix 1 teaspoon of salt in 8 ounces of warm water.  Gargle with this solution as much or often as you need or as directed. Swish and gargle gently if you have any sores or wounds in your mouth.  Do not swallow this mixture. Document Released: 11/08/2003 Document Revised: 04/28/2011 Document Reviewed: 03/31/2008 [facility] Patient Information 2015 [place], [place]. This information is not intended to replace advice given to you by your health care provider. Make sure you discuss any questions you have with your health care provider. Tonsillitis Tonsillitis is an infection of the throat that causes the tonsils to become red, tender, and swollen. Tonsils are collections of lymphoid tissue at the back of the throat. Each tonsil has crevices (crypts). Tonsils help fight nose and throat infections and keep infection from spreading to other parts of the body for the first 18 months of life.  CAUSES Sudden (acute) tonsillitis is usually caused by infection with streptococcal bacteria. Long-lasting (chronic) tonsillitis occurs when the crypts of the tonsils become filled with pieces of food and bacteria, which makes it easy for the tonsils to become repeatedly infected. SYMPTOMS  Symptoms of tonsillitis include:  A sore throat, with possible difficulty swallowing.  White patches on the tonsils.  Fever.  Tiredness.  New episodes of snoring during sleep, when you did not snore before.  Small, foul-smelling, yellowish-white pieces of material (tonsilloliths) that you occasionally cough up or spit out. The tonsilloliths can also cause you to have bad breath. DIAGNOSIS Tonsillitis can be diagnosed through a physical exam. Diagnosis can be confirmed with the results of lab tests, including a throat culture. TREATMENT  The goals of tonsillitis treatment include the reduction of the severity and  duration of symptoms and prevention of associated conditions. Symptoms of tonsillitis can be improved with the use of steroids to reduce the swelling. Tonsillitis caused by bacteria can be treated with antibiotic medicines. Usually, treatment with antibiotic medicines is started before the cause of the tonsillitis is known. However, if it is determined that the cause is not bacterial, antibiotic medicines will not treat the tonsillitis. If attacks of tonsillitis are severe and frequent, your health care provider may recommend surgery to remove the tonsils (tonsillectomy). HOME CARE INSTRUCTIONS   Rest as much as possible and get plenty of sleep.  Drink plenty of fluids. While the throat is very sore, eat soft foods or liquids, such as sherbet, soups, or instant breakfast drinks.  Eat frozen ice pops.  Gargle with a warm or cold liquid to help soothe the throat. Mix 1/4 teaspoon of salt and 1/4 teaspoon of baking soda in 8 oz of water. SEEK MEDICAL CARE IF:   Large, tender lumps develop in your neck.  A rash develops.  A green, yellow-brown, or bloody substance is coughed up.  You are unable to swallow liquids or food for 24 hours.  You notice that only one of the tonsils is swollen. SEEK IMMEDIATE MEDICAL CARE IF:   You develop any new symptoms such as vomiting, severe headache, stiff neck, chest pain, or trouble breathing or swallowing.  You have severe throat pain along with drooling or voice changes.  You have severe pain, unrelieved with recommended medications.  You are unable to fully open the mouth.  You develop redness, swelling, or severe pain anywhere in the neck.  You have a fever. MAKE SURE YOU:   Understand  these instructions.  Will watch your condition.  Will get help right away if you are not doing well or get worse. Document Released: 11/13/2004 Document Revised: 06/20/2013 Document Reviewed: 07/23/2012 [facility] Patient Information 2015 [place], [place]. This  information is not intended to replace advice given to you by your health care provider. Make sure you discuss any questions you have with your health care provider.

## 2014-02-16 NOTE — ED Notes (Signed)
Pt states she has had a headache, cough and sore throat since 2/25. Pt states she has been taking theraflu, ibuprofen with no relief.

## 2014-02-16 NOTE — ED Provider Notes (Signed)
CSN: [id number]     Arrival date & time 02/16/14  1926 History  This chart was scribed for non-physician practitioner, Charlann Lange, PA-C,working with Mirna Mires, MD, by Marlowe Kays, ED Scribe. This patient was seen in room WTR7/WTR7 and the patient's care was started at 8:08 PM.  Chief Complaint  Patient presents with  . Headache  . Sore Throat   The history is provided by the patient. No language interpreter was used.    HPI Comments:  Marisa [name] is a 38 y.o. obese female with PMH of chronic headaches, who presents to the Emergency Department complaining of severe sore throat and HA that began six days ago. She reports the symptoms began as congestion but now has turned into a severe sore throat with stabbing head pain. She reports associated subjective fever, cough and two episodes of emesis yesterday. She has taken TheraFlu and Ibuprofen with no significant relief from the symptoms. She denies otalgia, rhinorrhea, abdominal pain or rash. PMH of asthma, sleep apnea, anemia, colon cancer, IBS and DM. Allergy to PCN.  Past Medical History  Diagnosis Date  . Asthma   . EP (ectopic pregnancy)   . Ovarian tumor   . Tumor, thyroid   . Sleep apnea   . Chronic headaches   . Irregular heartbeat   . Chronic female pelvic pain 10/11/2012  . Abnormal uterine bleeding (AUB) 10/11/2012  . Cardiac arrest 1992    during delivery  . Shortness of breath   . Anemia   . Cancer     colon CA  . Chronic diarrhea   . Gait instability   . Frequent falls   . Anxiety   . Panic attack   . IBS (irritable bowel syndrome)   . Type II or unspecified type diabetes mellitus without mention of complication, not stated as uncontrolled 10/13/2013   Past Surgical History  Procedure Laterality Date  . Cholecystectomy  2005  . Unilateral salpingectomy  2009    Abdominal left salpingectomy  . Vaginal hysterectomy N/A 01/06/2013    Procedure: HYSTERECTOMY VAGINAL;  Surgeon: Osborne Oman, MD;   Location: [facility];  Service: Gynecology;  Laterality: N/A;  . Colonoscopy Left 04/29/2013    Procedure: COLONOSCOPY;  Surgeon: Arta Silence, MD;  Location: [facility];  Service: Endoscopy;  Laterality: Left;  . Flexible sigmoidoscopy N/A 11/09/2013    Procedure: FLEXIBLE SIGMOIDOSCOPY;  Surgeon: Arta Silence, MD;  Location: [facility];  Service: Endoscopy;  Laterality: N/A;   Family History  Problem Relation Age of Onset  . Hypertension Mother   . Diabetes Mother   . Cancer Father   . Hyperlipidemia Father   . Hypertension Father   . Allergies Mother   . Heart disease Mother   . Heart disease Maternal Grandmother    History  Substance Use Topics  . Smoking status: Current Every Day Smoker -- 0.25 packs/day for 11 years    Types: Cigarettes    Last Attempt to Quit: 09/12/2012  . Smokeless tobacco: Never Used     Comment: 1 pack per week  . Alcohol Use: 4.8 oz/week    7 Glasses of wine, 1 Cans of beer per week     Comment: 1 bottle of wine per week   OB History    Gravida Para Term Preterm AB TAB SAB Ectopic Multiple Living   _0 Review of Systems  Constitutional: Positive for fever (subjective).  HENT: Positive for sore throat. Negative for ear pain and rhinorrhea.   Respiratory: Positive for cough.   Gastrointestinal: Positive for vomiting. Negative for abdominal pain.  Skin: Negative for rash.  Neurological: Positive for headaches.  All other systems reviewed and are negative.   Allergies  Penicillins; Shellfish allergy; and Other  Home Medications   Prior to Admission medications   Medication Sig Start Date End Date Taking? Authorizing Provider  albuterol (PROVENTIL HFA;VENTOLIN HFA) 108 (90 BASE) MCG/ACT inhaler Inhale 1-2 puffs into the lungs every 6 (six) hours as needed for wheezing or shortness of breath.    Historical Provider, MD  dicyclomine (BENTYL) 20 MG tablet Take 1 tablet (20 mg total) by mouth every 6 (six) hours as needed for  spasms (abdominal cramping). 10/10/13   Francine Graven, DO  glucose blood test strip Use as instructed 10/13/13   Lance Bosch, NP  glucose monitoring kit (FREESTYLE) monitoring kit 1 each by Does not apply route as needed for other. 10/13/13   Lance Bosch, NP  Lancets (FREESTYLE) lancets Use as instructed 10/13/13   Lance Bosch, NP  metFORMIN (GLUCOPHAGE) 500 MG tablet Take 1 tablet (500 mg total) by mouth 2 (two) times daily with a meal. 10/10/13   Lance Bosch, NP  ondansetron (ZOFRAN) 4 MG tablet Take 1 tablet (4 mg total) by mouth every 8 (eight) hours as needed for nausea or vomiting. 10/10/13   Francine Graven, DO   Triage Vitals: BP 145/98 mmHg  Pulse 89  Temp(Src) 98.7 F (37.1 C) (Oral)  Resp 18  SpO2 97%  LMP 11/27/2012 Physical Exam  Constitutional: She is oriented to person, place, and time. She appears well-developed and well-nourished.  HENT:  Head: Normocephalic and atraumatic.  Mouth/Throat: Uvula is midline and mucous membranes are normal. No trismus in the jaw. Oropharyngeal exudate (bilateral) present.  Eyes: EOM are normal.  Neck: Normal range of motion.  Cardiovascular: Normal rate, regular rhythm and normal heart sounds.  Exam reveals no gallop and no friction rub.   No murmur heard. Pulmonary/Chest: Effort normal and breath sounds normal. No respiratory distress. She has no wheezes. She has no rales.  Abdominal: Soft. There is no tenderness.  Musculoskeletal: Normal range of motion.  Lymphadenopathy:    She has cervical adenopathy.  Neurological: She is alert and oriented to person, place, and time.  Skin: Skin is warm and dry.  Psychiatric: She has a normal mood and affect. Her behavior is normal.  Nursing note and vitals reviewed.   ED Course  Procedures (including critical care time) DIAGNOSTIC STUDIES: Oxygen Saturation is 97% on RA, normal by my interpretation.   COORDINATION OF CARE: 8:11 PM- Will prescribe antibiotics and Hycet. Pt  verbalizes understanding and agrees to plan.  Medications - No data to display  Labs Review Labs Reviewed - No data to display  Imaging Review No results found.   EKG Interpretation None      MDM   Final diagnoses:  None    1. Tonsillitis  She is having significant pain with swallowing but managing secretions and drinking fluids. Strep negative. Swelling bilaterally without evidence peritonsillar abscess, uvula midline. Will provide abx, pain management, supportive care recommendations. Return precautions provided.  I personally performed the services described in this documentation, which was scribed in my presence. The recorded information has been reviewed and is accurate.    Dewaine Oats, PA-C 02/20/14 2139  Mirna Mires, MD 02/21/14 [phone]

## 2014-02-20 ENCOUNTER — Inpatient Hospital Stay (HOSPITAL_COMMUNITY)
Admission: EM | Admit: 2014-02-20 | Discharge: 2014-02-23 | DRG: 203 | Disposition: A | Discharge status: Home / Self Care | Payer: 59 | Attending: Internal Medicine | Admitting: Internal Medicine

## 2014-02-20 ENCOUNTER — Encounter (HOSPITAL_COMMUNITY): Payer: Self-pay | Admitting: Emergency Medicine

## 2014-02-20 ENCOUNTER — Emergency Department (HOSPITAL_COMMUNITY): Payer: 59

## 2014-02-20 DIAGNOSIS — R0600 Dyspnea, unspecified: Secondary | ICD-10-CM

## 2014-02-20 DIAGNOSIS — J45901 Unspecified asthma with (acute) exacerbation: Secondary | ICD-10-CM | POA: Diagnosis not present

## 2014-02-20 DIAGNOSIS — Z91018 Allergy to other foods: Secondary | ICD-10-CM

## 2014-02-20 DIAGNOSIS — F17210 Nicotine dependence, cigarettes, uncomplicated: Secondary | ICD-10-CM | POA: Diagnosis present

## 2014-02-20 DIAGNOSIS — E119 Type 2 diabetes mellitus without complications: Secondary | ICD-10-CM

## 2014-02-20 DIAGNOSIS — Z90710 Acquired absence of both cervix and uterus: Secondary | ICD-10-CM

## 2014-02-20 DIAGNOSIS — J029 Acute pharyngitis, unspecified: Secondary | ICD-10-CM

## 2014-02-20 DIAGNOSIS — Z91013 Allergy to seafood: Secondary | ICD-10-CM

## 2014-02-20 DIAGNOSIS — J069 Acute upper respiratory infection, unspecified: Secondary | ICD-10-CM | POA: Diagnosis present

## 2014-02-20 DIAGNOSIS — F410 Panic disorder [episodic paroxysmal anxiety] without agoraphobia: Secondary | ICD-10-CM | POA: Diagnosis present

## 2014-02-20 DIAGNOSIS — R0602 Shortness of breath: Secondary | ICD-10-CM | POA: Diagnosis not present

## 2014-02-20 DIAGNOSIS — R079 Chest pain, unspecified: Secondary | ICD-10-CM

## 2014-02-20 DIAGNOSIS — J209 Acute bronchitis, unspecified: Secondary | ICD-10-CM | POA: Insufficient documentation

## 2014-02-20 DIAGNOSIS — R000 Tachycardia, unspecified: Secondary | ICD-10-CM

## 2014-02-20 DIAGNOSIS — D649 Anemia, unspecified: Secondary | ICD-10-CM | POA: Diagnosis present

## 2014-02-20 DIAGNOSIS — E669 Obesity, unspecified: Secondary | ICD-10-CM | POA: Diagnosis present

## 2014-02-20 DIAGNOSIS — K589 Irritable bowel syndrome without diarrhea: Secondary | ICD-10-CM | POA: Diagnosis present

## 2014-02-20 DIAGNOSIS — Z9049 Acquired absence of other specified parts of digestive tract: Secondary | ICD-10-CM | POA: Diagnosis present

## 2014-02-20 DIAGNOSIS — R002 Palpitations: Secondary | ICD-10-CM | POA: Diagnosis present

## 2014-02-20 DIAGNOSIS — Z85038 Personal history of other malignant neoplasm of large intestine: Secondary | ICD-10-CM

## 2014-02-20 DIAGNOSIS — Z880 Allergy status to penicillin: Secondary | ICD-10-CM

## 2014-02-20 HISTORY — DX: Chest pain, unspecified: R07.9

## 2014-02-20 HISTORY — DX: Acute pharyngitis, unspecified: J02.9

## 2014-02-20 HISTORY — DX: Tachycardia, unspecified: R00.0

## 2014-02-20 LAB — COMPREHENSIVE METABOLIC PANEL
ALT: 33 U/L (ref 0–35)
AST: 31 U/L (ref 0–37)
Albumin: 3.9 g/dL (ref 3.5–5.2)
Alkaline Phosphatase: 67 U/L (ref 39–117)
Anion gap: 6 (ref 5–15)
BUN: 11 mg/dL (ref 6–23)
CO2: 21 mmol/L (ref 19–32)
Calcium: 9.6 mg/dL (ref 8.4–10.5)
Chloride: 109 mEq/L (ref 96–112)
Creatinine, Ser: 0.75 mg/dL (ref 0.50–1.10)
GFR calc Af Amer: >90 mL/min (ref >90)
GFR calc non Af Amer: >90 mL/min (ref >90)
Glucose, Bld: 168 mg/dL — ABNORMAL HIGH (ref 70–99)
Potassium: 4 mmol/L (ref 3.5–5.1)
Sodium: 136 mmol/L (ref 135–145)
Total Bilirubin: 0.5 mg/dL (ref 0.3–1.2)
Total Protein: 7.7 g/dL (ref 6.0–8.3)

## 2014-02-20 LAB — BLOOD GAS, ARTERIAL
Acid-base deficit: 8.7 mmol/L — ABNORMAL HIGH (ref 0.0–2.0)
Bicarbonate: 15.3 mEq/L — ABNORMAL LOW (ref 20.0–24.0)
Drawn by: 232811
FIO2: 0.21 %
O2 Saturation: 97.2 %
Patient temperature: 98.1
TCO2: 13.9 mmol/L (ref 0–100)
pCO2 arterial: 28.2 mmHg — ABNORMAL LOW (ref 35.0–45.0)
pH, Arterial: 7.352 (ref 7.350–7.450)
pO2, Arterial: 93.5 mmHg (ref 80.0–100.0)

## 2014-02-20 LAB — CBC
HCT: 42.5 % (ref 36.0–46.0)
Hemoglobin: 14.5 g/dL (ref 12.0–15.0)
MCH: 31.7 pg (ref 26.0–34.0)
MCHC: 34.1 g/dL (ref 30.0–36.0)
MCV: 93 fL (ref 78.0–100.0)
Platelets: 458 10*3/uL — ABNORMAL HIGH (ref 150–400)
RBC: 4.57 MIL/uL (ref 3.87–5.11)
RDW: 12.3 % (ref 11.5–15.5)
WBC: 7.9 10*3/uL (ref 4.0–10.5)

## 2014-02-20 LAB — GLUCOSE, CAPILLARY: Glucose-Capillary: 274 mg/dL — ABNORMAL HIGH (ref 70–99)

## 2014-02-20 LAB — RAPID STREP SCREEN (MED CTR MEBANE ONLY): Streptococcus, Group A Screen (Direct): NEGATIVE

## 2014-02-20 LAB — I-STAT TROPONIN, ED
Troponin i, poc: 0 ng/mL (ref 0.00–0.08)
Troponin i, poc: 0 ng/mL (ref 0.00–0.08)

## 2014-02-20 LAB — BRAIN NATRIURETIC PEPTIDE: B Natriuretic Peptide: 15.1 pg/mL (ref 0.0–100.0)

## 2014-02-20 MED ORDER — SODIUM CHLORIDE 0.9 % IV BOLUS (SEPSIS)
500.0000 mL | Freq: Once | INTRAVENOUS | Status: AC
  Administered 2014-02-20: 500 mL via INTRAVENOUS

## 2014-02-20 MED ORDER — HYDROCODONE-ACETAMINOPHEN 5-325 MG PO TABS
1.0000 | ORAL_TABLET | ORAL | Status: DC | PRN
  Administered 2014-02-20 – 2014-02-23 (×6): 2 via ORAL
  Filled 2014-02-20 (×6): qty 2

## 2014-02-20 MED ORDER — METOCLOPRAMIDE HCL 5 MG/ML IJ SOLN
10.0000 mg | Freq: Once | INTRAMUSCULAR | Status: AC
  Administered 2014-02-20: 10 mg via INTRAVENOUS

## 2014-02-20 MED ORDER — ONDANSETRON HCL 4 MG/2ML IJ SOLN
4.0000 mg | Freq: Four times a day (QID) | INTRAMUSCULAR | Status: DC | PRN
  Administered 2014-02-22: 4 mg via INTRAVENOUS
  Filled 2014-02-20: qty 2

## 2014-02-20 MED ORDER — METOCLOPRAMIDE HCL 5 MG/ML IJ SOLN
INTRAMUSCULAR | Status: AC
  Filled 2014-02-20: qty 2

## 2014-02-20 MED ORDER — METHYLPREDNISOLONE SODIUM SUCC 125 MG IJ SOLR
60.0000 mg | Freq: Four times a day (QID) | INTRAMUSCULAR | Status: DC
  Administered 2014-02-20 – 2014-02-23 (×11): 60 mg via INTRAVENOUS
  Filled 2014-02-20 (×6): qty 0.96
  Filled 2014-02-20: qty 2
  Filled 2014-02-20: qty 0.96
  Filled 2014-02-20: qty 2
  Filled 2014-02-20 (×4): qty 0.96
  Filled 2014-02-20 (×2): qty 2

## 2014-02-20 MED ORDER — LORATADINE 10 MG PO TABS
10.0000 mg | ORAL_TABLET | Freq: Every day | ORAL | Status: DC
  Administered 2014-02-20 – 2014-02-23 (×5): 10 mg via ORAL
  Filled 2014-02-20 (×5): qty 1

## 2014-02-20 MED ORDER — DEXAMETHASONE SODIUM PHOSPHATE 10 MG/ML IJ SOLN
10.0000 mg | Freq: Once | INTRAMUSCULAR | Status: AC
  Administered 2014-02-20: 10 mg via INTRAVENOUS

## 2014-02-20 MED ORDER — DEXAMETHASONE SODIUM PHOSPHATE 10 MG/ML IJ SOLN
INTRAMUSCULAR | Status: AC
  Filled 2014-02-20: qty 1

## 2014-02-20 MED ORDER — LEVALBUTEROL HCL 1.25 MG/3ML IN NEBU: 1.2500 mg | INHALATION_SOLUTION | Freq: Once | RESPIRATORY_TRACT | Status: DC

## 2014-02-20 MED ORDER — GI COCKTAIL ~~LOC~~
30.0000 mL | Freq: Four times a day (QID) | ORAL | Status: DC | PRN
  Administered 2014-02-20: 30 mL via ORAL
  Filled 2014-02-20 (×2): qty 30

## 2014-02-20 MED ORDER — SODIUM CHLORIDE 0.9 % IV BOLUS (SEPSIS)
1000.0000 mL | Freq: Once | INTRAVENOUS | Status: AC
  Administered 2014-02-20: 1000 mL via INTRAVENOUS

## 2014-02-20 MED ORDER — LORAZEPAM 2 MG/ML IJ SOLN
1.0000 mg | Freq: Once | INTRAMUSCULAR | Status: DC
  Filled 2014-02-20: qty 1

## 2014-02-20 MED ORDER — KETOROLAC TROMETHAMINE 30 MG/ML IJ SOLN
30.0000 mg | Freq: Once | INTRAMUSCULAR | Status: AC
  Administered 2014-02-20: 30 mg via INTRAVENOUS
  Filled 2014-02-20: qty 1

## 2014-02-20 MED ORDER — DIPHENHYDRAMINE HCL 50 MG/ML IJ SOLN
INTRAMUSCULAR | Status: AC
  Filled 2014-02-20: qty 1

## 2014-02-20 MED ORDER — ACETAMINOPHEN 650 MG RE SUPP: 650.0000 mg | Freq: Four times a day (QID) | RECTAL | Status: DC | PRN

## 2014-02-20 MED ORDER — DIPHENHYDRAMINE HCL 50 MG/ML IJ SOLN
25.0000 mg | Freq: Once | INTRAMUSCULAR | Status: AC
  Administered 2014-02-20: 25 mg via INTRAVENOUS

## 2014-02-20 MED ORDER — BENZOCAINE 20 % MT SOLN: Freq: Four times a day (QID) | OROMUCOSAL | Status: DC | PRN

## 2014-02-20 MED ORDER — ACETAMINOPHEN 325 MG PO TABS
650.0000 mg | ORAL_TABLET | Freq: Four times a day (QID) | ORAL | Status: DC | PRN
  Administered 2014-02-22: 650 mg via ORAL
  Filled 2014-02-20: qty 2

## 2014-02-20 MED ORDER — SODIUM CHLORIDE 0.9 % IV SOLN
INTRAVENOUS | Status: DC
  Administered 2014-02-20 – 2014-02-23 (×4): via INTRAVENOUS

## 2014-02-20 MED ORDER — ALBUTEROL (5 MG/ML) CONTINUOUS INHALATION SOLN
10.0000 mg/h | INHALATION_SOLUTION | RESPIRATORY_TRACT | Status: DC
  Administered 2014-02-20: 10 mg/h via RESPIRATORY_TRACT
  Filled 2014-02-20: qty 20

## 2014-02-20 MED ORDER — LEVALBUTEROL HCL 1.25 MG/0.5ML IN NEBU
1.2500 mg | INHALATION_SOLUTION | RESPIRATORY_TRACT | Status: DC
  Filled 2014-02-20 (×2): qty 0.5

## 2014-02-20 MED ORDER — ONDANSETRON HCL 4 MG/2ML IJ SOLN
4.0000 mg | Freq: Once | INTRAMUSCULAR | Status: AC
  Administered 2014-02-20: 4 mg via INTRAVENOUS
  Filled 2014-02-20: qty 2

## 2014-02-20 MED ORDER — LEVALBUTEROL HCL 1.25 MG/0.5ML IN NEBU
1.2500 mg | INHALATION_SOLUTION | RESPIRATORY_TRACT | Status: DC
  Administered 2014-02-20 – 2014-02-23 (×14): 1.25 mg via RESPIRATORY_TRACT
  Filled 2014-02-20 (×16): qty 0.5

## 2014-02-20 MED ORDER — IOHEXOL 350 MG/ML SOLN
100.0000 mL | Freq: Once | INTRAVENOUS | Status: AC | PRN
  Administered 2014-02-20: 100 mL via INTRAVENOUS

## 2014-02-20 MED ORDER — IPRATROPIUM BROMIDE 0.03 % NA SOLN
2.0000 | Freq: Four times a day (QID) | NASAL | Status: DC | PRN
  Filled 2014-02-20: qty 15

## 2014-02-20 MED ORDER — LEVALBUTEROL HCL 1.25 MG/0.5ML IN NEBU
1.2500 mg | INHALATION_SOLUTION | Freq: Once | RESPIRATORY_TRACT | Status: DC
  Filled 2014-02-20: qty 0.5

## 2014-02-20 MED ORDER — MAGNESIUM SULFATE 2 GM/50ML IV SOLN
2.0000 g | Freq: Once | INTRAVENOUS | Status: AC
  Administered 2014-02-20: 2 g via INTRAVENOUS
  Filled 2014-02-20: qty 50

## 2014-02-20 MED ORDER — LEVALBUTEROL HCL 1.25 MG/0.5ML IN NEBU
1.2500 mg | INHALATION_SOLUTION | Freq: Once | RESPIRATORY_TRACT | Status: AC
  Administered 2014-02-20: 1.25 mg via RESPIRATORY_TRACT
  Filled 2014-02-20: qty 0.5

## 2014-02-20 MED ORDER — DICYCLOMINE HCL 20 MG PO TABS: 20.0000 mg | ORAL_TABLET | Freq: Four times a day (QID) | ORAL | Status: DC | PRN

## 2014-02-20 MED ORDER — INSULIN ASPART 100 UNIT/ML ~~LOC~~ SOLN
0.0000 [IU] | Freq: Three times a day (TID) | SUBCUTANEOUS | Status: DC
  Administered 2014-02-21: 3 [IU] via SUBCUTANEOUS
  Administered 2014-02-21: 1 [IU] via SUBCUTANEOUS
  Administered 2014-02-21: 3 [IU] via SUBCUTANEOUS
  Administered 2014-02-22: 2 [IU] via SUBCUTANEOUS
  Administered 2014-02-22: 3 [IU] via SUBCUTANEOUS
  Administered 2014-02-22 – 2014-02-23 (×2): 1 [IU] via SUBCUTANEOUS
  Administered 2014-02-23: 3 [IU] via SUBCUTANEOUS

## 2014-02-20 MED ORDER — DIPHENHYDRAMINE HCL 25 MG PO CAPS
25.0000 mg | ORAL_CAPSULE | Freq: Four times a day (QID) | ORAL | Status: DC | PRN
  Administered 2014-02-20: 25 mg via ORAL
  Filled 2014-02-20: qty 1

## 2014-02-20 MED ORDER — HEPARIN SODIUM (PORCINE) 5000 UNIT/ML IJ SOLN
5000.0000 [IU] | Freq: Three times a day (TID) | INTRAMUSCULAR | Status: DC
  Administered 2014-02-20 – 2014-02-23 (×8): 5000 [IU] via SUBCUTANEOUS
  Filled 2014-02-20 (×9): qty 1

## 2014-02-20 NOTE — ED Provider Notes (Signed)
6:29 PM Pt signed out to me at shift change. Pt withasthma exacerbation and chest pain, pending CT angio. CT angio is negative for PE. i reassessed pt, she is feeling slighlty better, still complaining of SOB. HR still elevated in 120s. She tried to ambulate in hallway, with significant sob, however her oxygen sat stayed in high 90s. She appears anxious. Complaining of worsening chest pain. Will get repeat trop, another treatment, and admit   7:33 PM Spoke with [facility]. Will admit. Pt receiving another treatment.   Filed Vitals:   02/20/14 1507 02/20/14 1749 02/20/14 1822 02/20/14 1837  BP: 97/60 114/64    Pulse: 128 108 122   Temp:  98.5 F (36.9 C)    TempSrc:  Oral    Resp: 23 20    SpO2: 94% 96% 99% 97%     Renold Genta, PA-C 02/20/14 1934  Leota Jacobsen, MD 02/23/14 1321

## 2014-02-20 NOTE — ED Notes (Signed)
Patient transported to CT 

## 2014-02-20 NOTE — H&P (Signed)
[facility] History and Physical  Marisa [name] CBS:[id number] DOB: [birth year] DOA: 02/20/2014  Referring physician: PA Lillia Pauls PCP: Angelica Chessman, MD   Chief Complaint: Sore throat  HPI: Marisa [name] is a 39 y.o. female  Sore throat started 4 days ago. Started on Azithro at that time for tosillitis. Sore throat has worsened. Worse w/ swallowing. Associated w/ runny nose and congestion. Fevers to 101. Theraflu w/o benefit. SOB started 24 hrs ago. Albuterol w/o benefit. Associated w/ wheezing and chest tightness.    Review of Systems:  Constitutional:  No weight loss, night sweats, Fevers, chills, fatigue.  HEENT:  No Difficulty swallowing,Tooth/dental problems,Sore throat,  Cardio-vascular:  No Orthopnea, PND, swelling in lower extremities, anasarca, dizziness, palpitations  GI:  No heartburn, indigestion, abdominal pain, nausea, vomiting, diarrhea, change in bowel habits, loss of appetite  Resp:  Per HPI Skin:  no rash or lesions.  GU:  no dysuria, change in color of urine, no urgency or frequency. No flank pain.  Musculoskeletal:  No joint pain or swelling. No decreased range of motion. No back pain.  Psych:  No change in mood or affect. No depression or anxiety. No memory loss.   Past Medical History  Diagnosis Date  . Asthma   . EP (ectopic pregnancy)   . Ovarian tumor   . Tumor, thyroid   . Sleep apnea   . Chronic headaches   . Irregular heartbeat   . Chronic female pelvic pain 10/11/2012  . Abnormal uterine bleeding (AUB) 10/11/2012  . Cardiac arrest 1992    during delivery  . Shortness of breath   . Anemia   . Cancer     colon CA  . Chronic diarrhea   . Gait instability   . Frequent falls   . Anxiety   . Panic attack   . IBS (irritable bowel syndrome)   . Type II or unspecified type diabetes mellitus without mention of complication, not stated as uncontrolled 10/13/2013   Past Surgical History  Procedure Laterality Date  .  Cholecystectomy  2005  . Unilateral salpingectomy  2009    Abdominal left salpingectomy  . Vaginal hysterectomy N/A 01/06/2013    Procedure: HYSTERECTOMY VAGINAL;  Surgeon: Osborne Oman, MD;  Location: [facility];  Service: Gynecology;  Laterality: N/A;  . Colonoscopy Left 04/29/2013    Procedure: COLONOSCOPY;  Surgeon: Arta Silence, MD;  Location: [facility];  Service: Endoscopy;  Laterality: Left;  . Flexible sigmoidoscopy N/A 11/09/2013    Procedure: FLEXIBLE SIGMOIDOSCOPY;  Surgeon: Arta Silence, MD;  Location: [facility];  Service: Endoscopy;  Laterality: N/A;   Social History:  reports that she quit smoking about 2 weeks ago. Her smoking use included Cigarettes. She has a 2.75 pack-year smoking history. She has never used smokeless tobacco. She reports that she drinks about 4.8 oz of alcohol per week. She reports that she does not use illicit drugs.  Allergies  Allergen Reactions  . Penicillins Anaphylaxis  . Shellfish Allergy Anaphylaxis  . Other     Mushroom-- swelling throat, eyes Animals with fur -- swelling throat, eyes    Family History  Problem Relation Age of Onset  . Hypertension Mother   . Diabetes Mother   . Cancer Father   . Hyperlipidemia Father   . Hypertension Father   . Allergies Mother   . Heart disease Mother   . Heart disease Maternal Grandmother      Prior to Admission medications   Medication Sig Start Date  End Date Taking? Authorizing Provider  albuterol (PROVENTIL HFA;VENTOLIN HFA) 108 (90 BASE) MCG/ACT inhaler Inhale 1-2 puffs into the lungs every 6 (six) hours as needed for wheezing or shortness of breath.   Yes Historical Provider, MD  glucose blood test strip Use as instructed 10/13/13  Yes Lance Bosch, NP  glucose monitoring kit (FREESTYLE) monitoring kit 1 each by Does not apply route as needed for other. 10/13/13  Yes Lance Bosch, NP  Lancets (FREESTYLE) lancets Use as instructed 10/13/13  Yes Lance Bosch, NP  azithromycin  (ZITHROMAX Z-PAK) 250 MG tablet Take 1 tablet (250 mg total) by mouth daily. Patient not taking: Reported on 02/20/2014 02/16/14   Nehemiah Settle A Upstill, PA-C  dicyclomine (BENTYL) 20 MG tablet Take 1 tablet (20 mg total) by mouth every 6 (six) hours as needed for spasms (abdominal cramping). Patient not taking: Reported on 02/20/2014 10/10/13   Francine Graven, DO  HYDROcodone-acetaminophen (HYCET) 7.5-325 mg/15 ml solution Take 10 mLs by mouth every 4 (four) hours as needed for moderate pain or severe pain. Patient not taking: Reported on 02/20/2014 02/16/14   Nehemiah Settle A Upstill, PA-C  metFORMIN (GLUCOPHAGE) 500 MG tablet Take 1 tablet (500 mg total) by mouth 2 (two) times daily with a meal. Patient not taking: Reported on 02/20/2014 10/10/13   Lance Bosch, NP  ondansetron (ZOFRAN) 4 MG tablet Take 1 tablet (4 mg total) by mouth every 8 (eight) hours as needed for nausea or vomiting. Patient not taking: Reported on 02/20/2014 10/10/13   Francine Graven, DO   Physical Exam: Filed Vitals:   02/20/14 1822 02/20/14 1837 02/20/14 1952 02/20/14 2004  BP:   126/74   Pulse: 122  111   Temp:   98.1 F (36.7 C)   TempSrc:   Oral   Resp:   19   Height:    '5\' 5"'  (1.651 m)  Weight:    112.946 kg (249 lb)  SpO2: 99% 97% 98%     Wt Readings from Last 3 Encounters:  02/20/14 112.946 kg (249 lb)  11/09/13 95.255 kg (210 lb)  09/29/13 109.589 kg (241 lb 9.6 oz)    General: Anxious Eyes:  PERRL, normal lids, irises & conjunctiva ENT:  Pharyngeal cobblestoning. Tonsils 1+ w/o exudate.  Neck:  no LAD, masses or thyromegaly Cardiovascular: Tachycardic, RR, no m/r/g. No LE edema. Telemetry: sinus tach, no arrhythmias  Respiratory: WHeezing throughout, good air movement. Increased effort. Abdomen:  soft, ntnd Skin:  no rash or induration seen on limited exam Musculoskeletal:  grossly normal tone BUE/BLE Psychiatric:  grossly normal mood and affect, speech fluent and appropriate Neurologic:  grossly non-focal.           Labs on Admission:  Basic Metabolic Panel:  Recent Labs Lab 02/20/14 1132  NA 136  K 4.0  CL 109  CO2 21  GLUCOSE 168*  BUN 11  CREATININE 0.75  CALCIUM 9.6   Liver Function Tests:  Recent Labs Lab 02/20/14 1132  AST 31  ALT 33  ALKPHOS 67  BILITOT 0.5  PROT 7.7  ALBUMIN 3.9   No results for input(s): LIPASE, AMYLASE in the last 168 hours. No results for input(s): AMMONIA in the last 168 hours. CBC:  Recent Labs Lab 02/20/14 1132  WBC 7.9  HGB 14.5  HCT 42.5  MCV 93.0  PLT 458*   Cardiac Enzymes: No results for input(s): CKTOTAL, CKMB, CKMBINDEX, TROPONINI in the last 168 hours.  BNP (last 3 results) No results for  input(s): PROBNP in the last 8760 hours. CBG: No results for input(s): GLUCAP in the last 168 hours.  Radiological Exams on Admission: Dg Chest 2 View  02/20/2014   CLINICAL DATA:  Shortness of breath.  EXAM: CHEST  2 VIEW  COMPARISON:  October 10, 2013.  FINDINGS: The heart size and mediastinal contours are within normal limits. Both lungs are clear. No pneumothorax or pleural effusion is noted. The visualized skeletal structures are unremarkable.  IMPRESSION: No acute cardiopulmonary abnormality seen.   Electronically Signed   By: Sabino Dick M.D.   On: 02/20/2014 13:53   Ct Angio Chest Pe W/cm &/or Wo Cm  02/20/2014   CLINICAL DATA:  Acute onset of shortness of breath earlier today. Patient currently being treated for tonsillitis. Current history of asthma but the patient has run out of her albuterol inhaler.  EXAM: CT ANGIOGRAPHY CHEST WITH CONTRAST  TECHNIQUE: Multidetector CT imaging of the chest was performed using the standard protocol during bolus administration of intravenous contrast. Multiplanar CT image reconstructions and MIPs were obtained to evaluate the vascular anatomy.  CONTRAST:  193m OMNIPAQUE IOHEXOL 350 MG/ML IV.  COMPARISON:  None.  FINDINGS: Contrast opacification of the pulmonary arteries is fair to good. No filling  defects within either main pulmonary artery or their branches in either lung to suggest pulmonary embolism. Heart size upper normal. No visible coronary atherosclerosis. No pericardial effusion. No visible atherosclerosis involving the thoracic or upper abdominal aorta. No made of bovine aortic arch anatomy (left common carotid artery arises from the innominate artery).  Markedly suboptimal inspiration which accounts for the atelectasis in the lower lobes. Lungs otherwise clear without localized airspace consolidation, interstitial disease, or parenchymal nodules. No pleural effusions. Central airways patent with moderate bronchial wall thickening.  No significant mediastinal, hilar or axillary lymphadenopathy. Mild enlargement of the visualized thyroid gland without nodularity.  Visualized upper abdomen unremarkable for the early phase of enhancement. Bone window images unremarkable.  Review of the MIP images confirms the above findings.  IMPRESSION: 1. No evidence of pulmonary embolism. 2. Suboptimal inspiration accounts for atelectasis in the lower lobes. Moderate central bronchial wall thickening consistent with asthma and/or bronchitis. No acute cardiopulmonary disease otherwise.   Electronically Signed   By: TEvangeline DakinM.D.   On: 02/20/2014 17:51    EKG: Independently reviewed. Sinus tach, no change from previous, no sign of ACS  Assessment/Plan Principal Problem:   Asthma exacerbation Active Problems:   Well controlled type 2 diabetes mellitus   Sore throat   Chest pain   Tachycardia  Asthma Exacerbation: Pt still w/ O2 requirement and increased WOB. Received Decadron, CAT and Mag w/ some improvement.  - Admit for obs - Xopenex Q4 - SOlumedrol [id number]  Chest pain: likely anxiety related. Improved after Ativan. Tropnins negative x2. EKG w/o change. Tachycardia likely from anxiety and albuterol (pt did not tolerate CAT due to palpitations) - Tele - Cycle troponins - EKG in am -  IVF  Anxiety: pt very anxious over current asthma flare and CP. Improved w/ Ativan in ED.  - Continue Ativan PRN  URI: likely viral in nature as no improvement on Z-pack. No culture done previously. Toradol in ED - Rapid strep - Mono - Nasal saline and nasal Atrovent.  - hurricane spray  DM: A1c 6.5 in Auguts. On metformin only. Anticipate elevation due to steroids  - SSI - A1c - hold metformin  Code Status: FULL DVT Prophylaxis: Heparin Family Communication: none Disposition Plan: pending  improvement    MERRELL, DAVID J [facility] [email or web address] Password [email or web address]

## 2014-02-20 NOTE — ED Notes (Signed)
Pt c/o SOB/asthma exacerbation since last night.   Pt c/o cough and "feeling feverish" for "awhile now."  Pt sts she was seen recently for a sore throat. Pt A&Ox4. Pt having difficulty speaking in complete sentences.

## 2014-02-20 NOTE — ED Notes (Signed)
Treatment stopped early due to pt. Heart rate 140.

## 2014-02-20 NOTE — ED Notes (Signed)
Respiratory paged

## 2014-02-20 NOTE — ED Notes (Signed)
Once PA in room, pt also c/o headache and facial pain.

## 2014-02-20 NOTE — ED Provider Notes (Signed)
CSN: [id number]     Arrival date & time 02/20/14  1114 History   First MD Initiated Contact with Patient 02/20/14 1128     Chief Complaint  Patient presents with  . Shortness of Breath  . Asthma   HPI  Patient is a 39 year old female who presents emergency room for evaluation of shortness of breath. Patient states that she was recently diagnosed with tonsillitis approximately a week and half ago and has had a sore throat, congestion, coughing, and generalized aching. Patient was seen here and was given azithromycin and hives that which has helped with her symptoms. This morning she developed acute shortness of breath. Patient does have a history of asthma, but is out of her albuterol inhaler. Patient's husband brought her straight here. Patient has never had asthma exacerbations like this in the past. Patient has not had any recent travel, surgeries, uses estrogen, or history of blood clots.  Past Medical History  Diagnosis Date  . Asthma   . EP (ectopic pregnancy)   . Ovarian tumor   . Tumor, thyroid   . Sleep apnea   . Chronic headaches   . Irregular heartbeat   . Chronic female pelvic pain 10/11/2012  . Abnormal uterine bleeding (AUB) 10/11/2012  . Cardiac arrest 1992    during delivery  . Shortness of breath   . Anemia   . Cancer     colon CA  . Chronic diarrhea   . Gait instability   . Frequent falls   . Anxiety   . Panic attack   . IBS (irritable bowel syndrome)   . Type II or unspecified type diabetes mellitus without mention of complication, not stated as uncontrolled 10/13/2013   Past Surgical History  Procedure Laterality Date  . Cholecystectomy  2005  . Unilateral salpingectomy  2009    Abdominal left salpingectomy  . Vaginal hysterectomy N/A 01/06/2013    Procedure: HYSTERECTOMY VAGINAL;  Surgeon: Osborne Oman, MD;  Location: [facility];  Service: Gynecology;  Laterality: N/A;  . Colonoscopy Left 04/29/2013    Procedure: COLONOSCOPY;  Surgeon: Arta Silence, MD;   Location: [facility];  Service: Endoscopy;  Laterality: Left;  . Flexible sigmoidoscopy N/A 11/09/2013    Procedure: FLEXIBLE SIGMOIDOSCOPY;  Surgeon: Arta Silence, MD;  Location: [facility];  Service: Endoscopy;  Laterality: N/A;   Family History  Problem Relation Age of Onset  . Hypertension Mother   . Diabetes Mother   . Cancer Father   . Hyperlipidemia Father   . Hypertension Father   . Allergies Mother   . Heart disease Mother   . Heart disease Maternal Grandmother    History  Substance Use Topics  . Smoking status: Current Every Day Smoker -- 0.25 packs/day for 11 years    Types: Cigarettes    Last Attempt to Quit: 09/12/2012  . Smokeless tobacco: Never Used     Comment: 1 pack per week  . Alcohol Use: 4.8 oz/week    7 Glasses of wine, 1 Cans of beer per week     Comment: 1 bottle of wine per week   OB History    Gravida Para Term Preterm AB TAB SAB Ectopic Multiple Living   '3 1 1  2 1  1  1     ' Review of Systems  Constitutional: Negative for fever, chills and fatigue.  HENT: Positive for congestion, rhinorrhea and sore throat.   Respiratory: Positive for cough, chest tightness and shortness of breath.   Cardiovascular: Negative  for chest pain, palpitations and leg swelling.  Gastrointestinal: Negative for nausea, vomiting, abdominal pain, diarrhea and constipation.  Genitourinary: Negative for dysuria, urgency, frequency, hematuria and difficulty urinating.  All other systems reviewed and are negative.     Allergies  Penicillins; Shellfish allergy; and Other  Home Medications   Prior to Admission medications   Medication Sig Start Date End Date Taking? Authorizing Provider  albuterol (PROVENTIL HFA;VENTOLIN HFA) 108 (90 BASE) MCG/ACT inhaler Inhale 1-2 puffs into the lungs every 6 (six) hours as needed for wheezing or shortness of breath.   Yes Historical Provider, MD  glucose blood test strip Use as instructed 10/13/13  Yes Lance Bosch, NP  glucose  monitoring kit (FREESTYLE) monitoring kit 1 each by Does not apply route as needed for other. 10/13/13  Yes Lance Bosch, NP  Lancets (FREESTYLE) lancets Use as instructed 10/13/13  Yes Lance Bosch, NP  azithromycin (ZITHROMAX Z-PAK) 250 MG tablet Take 1 tablet (250 mg total) by mouth daily. Patient not taking: Reported on 02/20/2014 02/16/14   Nehemiah Settle A Upstill, PA-C  dicyclomine (BENTYL) 20 MG tablet Take 1 tablet (20 mg total) by mouth every 6 (six) hours as needed for spasms (abdominal cramping). Patient not taking: Reported on 02/20/2014 10/10/13   Francine Graven, DO  HYDROcodone-acetaminophen (HYCET) 7.5-325 mg/15 ml solution Take 10 mLs by mouth every 4 (four) hours as needed for moderate pain or severe pain. Patient not taking: Reported on 02/20/2014 02/16/14   Nehemiah Settle A Upstill, PA-C  metFORMIN (GLUCOPHAGE) 500 MG tablet Take 1 tablet (500 mg total) by mouth 2 (two) times daily with a meal. Patient not taking: Reported on 02/20/2014 10/10/13   Lance Bosch, NP  ondansetron (ZOFRAN) 4 MG tablet Take 1 tablet (4 mg total) by mouth every 8 (eight) hours as needed for nausea or vomiting. Patient not taking: Reported on 02/20/2014 10/10/13   Francine Graven, DO   BP 97/60 mmHg  Pulse 128  Temp(Src) 98.4 F (36.9 C) (Oral)  Resp 23  SpO2 94%  LMP 11/27/2012 Physical Exam  Constitutional: She is oriented to person, place, and time. She appears well-developed and well-nourished. No distress.  HENT:  Head: Normocephalic and atraumatic.  Mouth/Throat: Oropharynx is clear and moist. No oropharyngeal exudate.  Eyes: Conjunctivae and EOM are normal. Pupils are equal, round, and reactive to light. No scleral icterus.  Neck: Normal range of motion. Neck supple. No JVD present. No thyromegaly present.  Cardiovascular: Normal rate, regular rhythm, normal heart sounds and intact distal pulses.  Exam reveals no gallop and no friction rub.   No murmur heard. Pulmonary/Chest: She has no wheezes. She has no  rales. She exhibits no tenderness.  Patient With increased work of breathing. Breath sounds are distant and diminished at the bases. There is no real wheezing apparent  Abdominal: Soft. Bowel sounds are normal.  Musculoskeletal: Normal range of motion.  Lymphadenopathy:    She has no cervical adenopathy.  Neurological: She is alert and oriented to person, place, and time. She has normal strength. No cranial nerve deficit or sensory deficit. Coordination normal.  Skin: Skin is warm and dry. She is not diaphoretic.  Psychiatric: She has a normal mood and affect. Her behavior is normal. Judgment and thought content normal.  Nursing note and vitals reviewed.   ED Course  Procedures (including critical care time) Labs Review Labs Reviewed  CBC - Abnormal; Notable for the following:    Platelets 458 (*)    All other  components within normal limits  COMPREHENSIVE METABOLIC PANEL - Abnormal; Notable for the following:    Glucose, Bld 168 (*)    All other components within normal limits  BRAIN NATRIURETIC PEPTIDE  BLOOD GAS, ARTERIAL  I-STAT TROPOININ, ED    Imaging Review Dg Chest 2 View  02/20/2014   CLINICAL DATA:  Shortness of breath.  EXAM: CHEST  2 VIEW  COMPARISON:  October 10, 2013.  FINDINGS: The heart size and mediastinal contours are within normal limits. Both lungs are clear. No pneumothorax or pleural effusion is noted. The visualized skeletal structures are unremarkable.  IMPRESSION: No acute cardiopulmonary abnormality seen.   Electronically Signed   By: Sabino Dick M.D.   On: 02/20/2014 13:53     EKG Interpretation   Date/Time:  Monday February 20 2014 11:21:31 EST Ventricular Rate:  116 PR Interval:  145 QRS Duration: 68 QT Interval:  316 QTC Calculation: 439 R Axis:   -11 Text Interpretation:  Sinus tachycardia Ventricular premature complex  Inferior infarct, old Baseline wander in lead(s) I No significant change  was found Confirmed by CAMPOS  MD, Lennette Bihari ([id number]) on  02/20/2014 4:00:42 PM      MDM   Final diagnoses:  Dyspnea   Patient is a 39 year old female who presents emergency room for evaluation of shortness of breath. Patient given continuous nebulizer but only received half due to tachycardia. Chest x-ray is negative. CBC, CMP, BNP and i-STAT troponin are negative for acute abnormalities. Patient was in improved with Sulfate and Ativan. She states that she does not feel any better. Patient also complaining of headache. Migraine cocktail given. Decadron also given. Patient has few risk factors for PE, but given tachycardia and tachypnea perform CT angiogram of the chest. Signed the patient out with Damien Fusi.  Patient seen by and discussed with Dr. Louann Sjogren who agrees with the above workup and plan. Patients disposition should be placed on CT angiogram results.    Cherylann Parr, PA-C 02/20/14 [id number]  Virgel Manifold, MD 02/25/14 [phone]

## 2014-02-20 NOTE — ED Notes (Signed)
Respiratory at bedside.

## 2014-02-20 NOTE — ED Notes (Addendum)
Once pt settled, pt sts she coughed up "a tissue full of blood" earlier today. Pt sts this has happened previously since dealing with her sore throat. Pt also c/o chest pain.

## 2014-02-20 NOTE — ED Notes (Signed)
respiratory contacted again.

## 2014-02-20 NOTE — ED Notes (Addendum)
Patients visitor stated that patient couldn't have Xray at this time because she needs respiratory treatment first. Xray stated that they will come back later.

## 2014-02-21 DIAGNOSIS — K589 Irritable bowel syndrome without diarrhea: Secondary | ICD-10-CM | POA: Diagnosis present

## 2014-02-21 DIAGNOSIS — F17210 Nicotine dependence, cigarettes, uncomplicated: Secondary | ICD-10-CM | POA: Diagnosis present

## 2014-02-21 DIAGNOSIS — Z91018 Allergy to other foods: Secondary | ICD-10-CM | POA: Diagnosis not present

## 2014-02-21 DIAGNOSIS — J069 Acute upper respiratory infection, unspecified: Secondary | ICD-10-CM | POA: Diagnosis present

## 2014-02-21 DIAGNOSIS — R0602 Shortness of breath: Secondary | ICD-10-CM | POA: Diagnosis present

## 2014-02-21 DIAGNOSIS — E119 Type 2 diabetes mellitus without complications: Secondary | ICD-10-CM | POA: Diagnosis present

## 2014-02-21 DIAGNOSIS — J45901 Unspecified asthma with (acute) exacerbation: Secondary | ICD-10-CM | POA: Diagnosis present

## 2014-02-21 DIAGNOSIS — Z90710 Acquired absence of both cervix and uterus: Secondary | ICD-10-CM | POA: Diagnosis not present

## 2014-02-21 DIAGNOSIS — Z9049 Acquired absence of other specified parts of digestive tract: Secondary | ICD-10-CM | POA: Diagnosis present

## 2014-02-21 DIAGNOSIS — E669 Obesity, unspecified: Secondary | ICD-10-CM | POA: Diagnosis present

## 2014-02-21 DIAGNOSIS — R002 Palpitations: Secondary | ICD-10-CM | POA: Diagnosis present

## 2014-02-21 DIAGNOSIS — R0789 Other chest pain: Secondary | ICD-10-CM

## 2014-02-21 DIAGNOSIS — Z880 Allergy status to penicillin: Secondary | ICD-10-CM | POA: Diagnosis not present

## 2014-02-21 DIAGNOSIS — R000 Tachycardia, unspecified: Secondary | ICD-10-CM | POA: Diagnosis present

## 2014-02-21 DIAGNOSIS — D649 Anemia, unspecified: Secondary | ICD-10-CM | POA: Diagnosis present

## 2014-02-21 DIAGNOSIS — Z91013 Allergy to seafood: Secondary | ICD-10-CM | POA: Diagnosis not present

## 2014-02-21 DIAGNOSIS — Z85038 Personal history of other malignant neoplasm of large intestine: Secondary | ICD-10-CM | POA: Diagnosis not present

## 2014-02-21 DIAGNOSIS — F410 Panic disorder [episodic paroxysmal anxiety] without agoraphobia: Secondary | ICD-10-CM | POA: Diagnosis present

## 2014-02-21 LAB — CBC
HCT: 37.7 % (ref 36.0–46.0)
Hemoglobin: 12.5 g/dL (ref 12.0–15.0)
MCH: 31.3 pg (ref 26.0–34.0)
MCHC: 33.2 g/dL (ref 30.0–36.0)
MCV: 94.3 fL (ref 78.0–100.0)
Platelets: 445 10*3/uL — ABNORMAL HIGH (ref 150–400)
RBC: 4 MIL/uL (ref 3.87–5.11)
RDW: 12.6 % (ref 11.5–15.5)
WBC: 11.7 10*3/uL — ABNORMAL HIGH (ref 4.0–10.5)

## 2014-02-21 LAB — HEMOGLOBIN A1C
Hgb A1c MFr Bld: 6.2 % — ABNORMAL HIGH (ref <5.7)
Mean Plasma Glucose: 131 mg/dL — ABNORMAL HIGH (ref <117)

## 2014-02-21 LAB — GLUCOSE, CAPILLARY
Glucose-Capillary: 151 mg/dL — ABNORMAL HIGH (ref 70–99)
Glucose-Capillary: 230 mg/dL — ABNORMAL HIGH (ref 70–99)
Glucose-Capillary: 188 mg/dL — ABNORMAL HIGH (ref 70–99)
Glucose-Capillary: 243 mg/dL — ABNORMAL HIGH (ref 70–99)

## 2014-02-21 LAB — TROPONIN I
Troponin I: <0.03 ng/mL (ref <0.031)
Troponin I: <0.03 ng/mL (ref <0.031)
Troponin I: <0.03 ng/mL (ref <0.031)

## 2014-02-21 MED ORDER — LORAZEPAM 1 MG PO TABS
1.0000 mg | ORAL_TABLET | Freq: Three times a day (TID) | ORAL | Status: DC | PRN
  Administered 2014-02-22 – 2014-02-23 (×2): 1 mg via ORAL
  Filled 2014-02-21 (×2): qty 1

## 2014-02-21 MED ORDER — ZOLPIDEM TARTRATE 5 MG PO TABS
5.0000 mg | ORAL_TABLET | Freq: Every evening | ORAL | Status: DC | PRN
  Administered 2014-02-21 – 2014-02-22 (×2): 5 mg via ORAL
  Filled 2014-02-21 (×2): qty 1

## 2014-02-21 NOTE — H&P (Signed)
[facility] History and Physical  Marisa [name] YYF:[id number] DOB: 12-04-1975    PCP:   Marisa Chessman, MD  Marisa [name] is an 39 y.o. female admitted for Asthma exacerbation, atypical CP, and anxiety.  She is doing better with IV Steroids, Nebs, and antibiotics.  She r/out.  Her PMH included DM, obesity, GAD, and asthma.  Rewiew of Systems:  Constitutional: Negative for malaise, fever and chills. No significant weight loss or weight gain Eyes: Negative for eye pain, redness and discharge, diplopia, visual changes, or flashes of light. ENMT: Negative for ear pain, hoarseness, nasal congestion, sinus pressure and sore throat. No headaches; tinnitus, drooling, or problem swallowing. Cardiovascular: Negative for palpitations, diaphoresis, and peripheral edema. ; No orthopnea, PND Respiratory: Negative for cough, hemoptysis, wheezing and stridor. No pleuritic chestpain. Gastrointestinal: Negative for nausea, vomiting, diarrhea, constipation, abdominal pain, melena, blood in stool, hematemesis, jaundice and rectal bleeding.    Genitourinary: Negative for frequency, dysuria, incontinence,flank pain and hematuria; Musculoskeletal: Negative for back pain and neck pain. Negative for swelling and trauma.;  Skin: . Negative for pruritus, rash, abrasions, bruising and skin lesion.; ulcerations Neuro: Negative for headache, lightheadedness and neck stiffness. Negative for weakness, altered level of consciousness , altered mental status, extremity weakness, burning feet, involuntary movement, seizure and syncope.  Psych: negative for depression, insomnia, tearfulness, panic attacks, hallucinations, paranoia, suicidal or homicidal ideation    Past Medical History  Diagnosis Date  . Asthma   . EP (ectopic pregnancy)   . Ovarian tumor   . Tumor, thyroid   . Sleep apnea   . Chronic headaches   . Irregular heartbeat   . Chronic female pelvic pain 10/11/2012  . Abnormal uterine  bleeding (AUB) 10/11/2012  . Cardiac arrest 1992    during delivery  . Shortness of breath   . Anemia   . Cancer     colon CA  . Chronic diarrhea   . Gait instability   . Frequent falls   . Anxiety   . Panic attack   . IBS (irritable bowel syndrome)   . Type II or unspecified type diabetes mellitus without mention of complication, not stated as uncontrolled 10/13/2013    Past Surgical History  Procedure Laterality Date  . Cholecystectomy  2005  . Unilateral salpingectomy  2009    Abdominal left salpingectomy  . Vaginal hysterectomy N/A 01/06/2013    Procedure: HYSTERECTOMY VAGINAL;  Surgeon: Osborne Oman, MD;  Location: [facility];  Service: Gynecology;  Laterality: N/A;  . Colonoscopy Left 04/29/2013    Procedure: COLONOSCOPY;  Surgeon: Arta Silence, MD;  Location: [facility];  Service: Endoscopy;  Laterality: Left;  . Flexible sigmoidoscopy N/A 11/09/2013    Procedure: FLEXIBLE SIGMOIDOSCOPY;  Surgeon: Arta Silence, MD;  Location: [facility];  Service: Endoscopy;  Laterality: N/A;    Medications:  HOME MEDS: Prior to Admission medications   Medication Sig Start Date End Date Taking? Authorizing Provider  albuterol (PROVENTIL HFA;VENTOLIN HFA) 108 (90 BASE) MCG/ACT inhaler Inhale 1-2 puffs into the lungs every 6 (six) hours as needed for wheezing or shortness of breath.   Yes Historical Provider, MD  glucose blood test strip Use as instructed 10/13/13  Yes Lance Bosch, NP  glucose monitoring kit (FREESTYLE) monitoring kit 1 each by Does not apply route as needed for other. 10/13/13  Yes Lance Bosch, NP  Lancets (FREESTYLE) lancets Use as instructed 10/13/13  Yes Lance Bosch, NP  azithromycin (ZITHROMAX Z-PAK) 250 MG tablet Take 1  tablet (250 mg total) by mouth daily. Patient not taking: Reported on 02/20/2014 02/16/14   Nehemiah Settle A Upstill, PA-C  dicyclomine (BENTYL) 20 MG tablet Take 1 tablet (20 mg total) by mouth every 6 (six) hours as needed for spasms (abdominal  cramping). Patient not taking: Reported on 02/20/2014 10/10/13   Francine Graven, DO  HYDROcodone-acetaminophen (HYCET) 7.5-325 mg/15 ml solution Take 10 mLs by mouth every 4 (four) hours as needed for moderate pain or severe pain. Patient not taking: Reported on 02/20/2014 02/16/14   Nehemiah Settle A Upstill, PA-C  metFORMIN (GLUCOPHAGE) 500 MG tablet Take 1 tablet (500 mg total) by mouth 2 (two) times daily with a meal. Patient not taking: Reported on 02/20/2014 10/10/13   Lance Bosch, NP  ondansetron (ZOFRAN) 4 MG tablet Take 1 tablet (4 mg total) by mouth every 8 (eight) hours as needed for nausea or vomiting. Patient not taking: Reported on 02/20/2014 10/10/13   Francine Graven, DO     Allergies:  Allergies  Allergen Reactions  . Penicillins Anaphylaxis  . Shellfish Allergy Anaphylaxis  . Other     Mushroom-- swelling throat, eyes Animals with fur -- swelling throat, eyes    Social History:   reports that she quit smoking about 2 weeks ago. Her smoking use included Cigarettes. She has a 2.75 pack-year smoking history. She has never used smokeless tobacco. She reports that she drinks about 4.8 oz of alcohol per week. She reports that she does not use illicit drugs.  Family History: Family History  Problem Relation Age of Onset  . Hypertension Mother   . Diabetes Mother   . Cancer Father   . Hyperlipidemia Father   . Hypertension Father   . Allergies Mother   . Heart disease Mother   . Heart disease Maternal Grandmother      Physical Exam: Filed Vitals:   02/21/14 0855 02/21/14 1149 02/21/14 1447 02/21/14 1545  BP:   132/71   Pulse:   92   Temp:   97.8 F (36.6 C)   TempSrc:   Oral   Resp:   24   Height:      Weight:      SpO2: 99% 99% 99% 96%   Blood pressure 132/71, pulse 92, temperature 97.8 F (36.6 C), temperature source Oral, resp. rate 24, height _0  (1.651 m), weight 112.946 kg (249 lb), last menstrual period 11/27/2012, SpO2 96 %.  GEN:  Pleasant  patient lying in  the stretcher in no acute distress; cooperative with exam. PSYCH:  alert and oriented x4; does not appear anxious or depressed; affect is appropriate. HEENT: Mucous membranes pink and anicteric; PERRLA; EOM intact; no cervical lymphadenopathy nor thyromegaly or carotid bruit; no JVD; There were no stridor. Neck is very supple. Breasts:: Not examined CHEST WALL: No tenderness CHEST: Normal respiration, She has diffuse wheezing bilaterally, no rales. HEART: Regular rate and rhythm.  There are no murmur, rub, or gallops.   BACK: No kyphosis or scoliosis; no CVA tenderness ABDOMEN: soft and non-tender; no masses, no organomegaly, normal abdominal bowel sounds; no pannus; no intertriginous candida. There is no rebound and no distention. Rectal Exam: Not done EXTREMITIES: No bone or joint deformity; age-appropriate arthropathy of the hands and knees; no edema; no ulcerations.  There is no calf tenderness. Genitalia: not examined PULSES: 2+ and symmetric SKIN: Normal hydration no rash or ulceration CNS: Cranial nerves 2-12 grossly intact no focal lateralizing neurologic deficit.  Speech is fluent; uvula elevated with phonation, facial symmetry  and tongue midline. DTR are normal bilaterally, cerebella exam is intact, barbinski is negative and strengths are equaled bilaterally.  No sensory loss.   Labs on Admission:  Basic Metabolic Panel:  Recent Labs Lab 02/20/14 1132  NA 136  K 4.0  CL 109  CO2 21  GLUCOSE 168*  BUN 11  CREATININE 0.75  CALCIUM 9.6   Liver Function Tests:  Recent Labs Lab 02/20/14 1132  AST 31  ALT 33  ALKPHOS 67  BILITOT 0.5  PROT 7.7  ALBUMIN 3.9   No results for input(s): LIPASE, AMYLASE in the last 168 hours. No results for input(s): AMMONIA in the last 168 hours. CBC:  Recent Labs Lab 02/20/14 1132 02/21/14 0300  WBC 7.9 11.7*  HGB 14.5 12.5  HCT 42.5 37.7  MCV 93.0 94.3  PLT 458* 445*   Cardiac Enzymes:  Recent Labs Lab 02/21/14 0025  02/21/14 0250 02/21/14 0621  TROPONINI <0.03 <0.03 <0.03    CBG:  Recent Labs Lab 02/20/14 2227 02/21/14 0729 02/21/14 1125  GLUCAP 274* 151* 230*     Radiological Exams on Admission: Dg Chest 2 View  02/20/2014   CLINICAL DATA:  Shortness of breath.  EXAM: CHEST  2 VIEW  COMPARISON:  October 10, 2013.  FINDINGS: The heart size and mediastinal contours are within normal limits. Both lungs are clear. No pneumothorax or pleural effusion is noted. The visualized skeletal structures are unremarkable.  IMPRESSION: No acute cardiopulmonary abnormality seen.   Electronically Signed   By: Sabino Dick M.D.   On: 02/20/2014 13:53   Ct Angio Chest Pe W/cm &/or Wo Cm  02/20/2014   CLINICAL DATA:  Acute onset of shortness of breath earlier today. Patient currently being treated for tonsillitis. Current history of asthma but the patient has run out of her albuterol inhaler.  EXAM: CT ANGIOGRAPHY CHEST WITH CONTRAST  TECHNIQUE: Multidetector CT imaging of the chest was performed using the standard protocol during bolus administration of intravenous contrast. Multiplanar CT image reconstructions and MIPs were obtained to evaluate the vascular anatomy.  CONTRAST:  164m OMNIPAQUE IOHEXOL 350 MG/ML IV.  COMPARISON:  None.  FINDINGS: Contrast opacification of the pulmonary arteries is fair to good. No filling defects within either main pulmonary artery or their branches in either lung to suggest pulmonary embolism. Heart size upper normal. No visible coronary atherosclerosis. No pericardial effusion. No visible atherosclerosis involving the thoracic or upper abdominal aorta. No made of bovine aortic arch anatomy (left common carotid artery arises from the innominate artery).  Markedly suboptimal inspiration which accounts for the atelectasis in the lower lobes. Lungs otherwise clear without localized airspace consolidation, interstitial disease, or parenchymal nodules. No pleural effusions. Central airways patent  with moderate bronchial wall thickening.  No significant mediastinal, hilar or axillary lymphadenopathy. Mild enlargement of the visualized thyroid gland without nodularity.  Visualized upper abdomen unremarkable for the early phase of enhancement. Bone window images unremarkable.  Review of the MIP images confirms the above findings.  IMPRESSION: 1. No evidence of pulmonary embolism. 2. Suboptimal inspiration accounts for atelectasis in the lower lobes. Moderate central bronchial wall thickening consistent with asthma and/or bronchitis. No acute cardiopulmonary disease otherwise.   Electronically Signed   By: TEvangeline DakinM.D.   On: 02/20/2014 17:51    Assessment/Plan Asthma exacerbation Anxiety  Atypical CP Obesity  PLAN:  Will continue with IV steroids as she is still wheezing.  Continue with Neb and antibiotics.  She can use oral Ativan for his anxiety,  and will give her ambien per her request to sleep.  She is stable, full code, and will be admitted to hospitalist service.   Other plans as per orders.  Code Status: FULL Haskel Khan, MD. [facility] Pager [phone] 7pm to 7am.  02/21/2014, 4:46 PM

## 2014-02-21 NOTE — Progress Notes (Signed)
UR completed 

## 2014-02-21 NOTE — Care Management Note (Addendum)
    Page 1 of 1   02/23/2014     2:19:49 PM CARE MANAGEMENT NOTE 02/23/2014  Patient:  Marisa [name], Marisa [name]   Account Number:  [id number]  Date Initiated:  02/21/2014  Documentation initiated by:  Dessa Phi  Subjective/Objective Assessment:   39 y/o f admitted w/Asthma exac.     Action/Plan:   From home w/spouse.Has pcp,pharmacy.   Anticipated DC Date:  02/23/2014   Anticipated DC Plan:  [place]  CM consult      Choice offered to / List presented to:  C-1 Patient   DME arranged  NEBULIZER MACHINE      DME agency  [facility].        Status of service:  Completed, signed off Medicare Important Message given?   (If response is "NO", the following Medicare IM given date fields will be blank) Date Medicare IM given:   Medicare IM given by:   Date Additional Medicare IM given:   Additional Medicare IM given by:    Discharge Disposition:  HOME/SELF CARE  Per UR Regulation:  Reviewed for med. necessity/level of care/duration of stay  If discussed at [place] of Stay Meetings, dates discussed:    Comments:  02/23/14 Dessa Phi RN BSN NCM [phone] [place] dme rep brought neb machine to patient's rm.Patient has all ascripts for neb machine meds to take to pharmacy.No further d/c needs.  02/21/14 Dessa Phi RN BSN NCM [phone] Spoke to patient about d/c plans.She states she has a neb machine @ home that is over 61yrs old.[facility] did not have a neb machine in their system. If neb machine is ordered, can arrange w/[facility] dme rep.Provided patient w/needymeds.org website, & [email or web address] website for med discounts & coupons. Patient has script coverage, & understands that she is obligated to her co pay.She uses [facility] pharmacy for her scripts, likely the most reasonable. No further d/c needs.

## 2014-02-22 LAB — CULTURE, GROUP A STREP

## 2014-02-22 LAB — GLUCOSE, CAPILLARY
Glucose-Capillary: 166 mg/dL — ABNORMAL HIGH (ref 70–99)
Glucose-Capillary: 148 mg/dL — ABNORMAL HIGH (ref 70–99)
Glucose-Capillary: 251 mg/dL — ABNORMAL HIGH (ref 70–99)
Glucose-Capillary: 255 mg/dL — ABNORMAL HIGH (ref 70–99)

## 2014-02-22 MED ORDER — NYSTATIN 100000 UNIT/ML MT SUSP
5.0000 mL | Freq: Four times a day (QID) | OROMUCOSAL | Status: DC
  Administered 2014-02-22 – 2014-02-23 (×2): 500000 [IU] via ORAL
  Filled 2014-02-22 (×5): qty 5

## 2014-02-22 NOTE — Progress Notes (Signed)
Pt reports to RN that she tried to get up out of bed and go to the bathroom. Once she was standing Pt states "I lost my strength and went to my knees" Pt was able to get up and get back in the bed by herself.  Pt assessed by RN and Pt's only complaint is her knees are sore no bruising noted at this time to bilateral knees. No other injuries or complaints found or voiced by Pt. VSS. Pt reeducated about safety measures and Pt does state "I knew I should call but I didn't want to bother anyone and thought I could do it." Pt's bed alarm placed on and call button is within reach of pt. MD paged and will update MD.

## 2014-02-22 NOTE — Progress Notes (Signed)
[facility] PROGRESS NOTE  Marisa [name] XLK:[id number] DOB: [birth year]    PCP:   Marisa Chessman, MD   [id number] [name] is an 39 y.o. female admitted for Asthma exacerbation, atypical CP, and anxiety. She is doing better with IV Steroids, Nebs, and antibiotics. She r/out. Her PMH included DM, obesity, GAD, and asthma. She is still wheezing.  Also has some odynophagia.  Rewiew of Systems:  Constitutional: Negative for malaise, fever and chills. No significant weight loss or weight gain Eyes: Negative for eye pain, redness and discharge, diplopia, visual changes, or flashes of light. ENMT: Negative for ear pain, hoarseness, nasal congestion, sinus pressure and sore throat. No headaches; tinnitus, drooling, or problem swallowing. Cardiovascular: Negative for chest pain, palpitations, diaphoresis, dyspnea and peripheral edema. ; No orthopnea, PND Respiratory: Negative for cough, hemoptysis,  and stridor. No pleuritic chestpain. Gastrointestinal: Negative for nausea, vomiting, diarrhea, constipation, abdominal pain, melena, blood in stool, hematemesis, jaundice and rectal bleeding.    Genitourinary: Negative for frequency, dysuria, incontinence,flank pain and hematuria; Musculoskeletal: Negative for back pain and neck pain. Negative for swelling and trauma.;  Skin: . Negative for pruritus, rash, abrasions, bruising and skin lesion.; ulcerations Neuro: Negative for headache, lightheadedness and neck stiffness. Negative for weakness, altered level of consciousness , altered mental status, extremity weakness, burning feet, involuntary movement, seizure and syncope.  Psych: negative for anxiety, depression, insomnia, tearfulness, panic attacks, hallucinations, paranoia, suicidal or homicidal ideation    Past Medical History  Diagnosis Date  . Asthma   . EP (ectopic pregnancy)   . Ovarian tumor   . Tumor, thyroid   . Sleep apnea   . Chronic headaches   . Irregular  heartbeat   . Chronic female pelvic pain 10/11/2012  . Abnormal uterine bleeding (AUB) 10/11/2012  . Cardiac arrest 1992    during delivery  . Shortness of breath   . Anemia   . Cancer     colon CA  . Chronic diarrhea   . Gait instability   . Frequent falls   . Anxiety   . Panic attack   . IBS (irritable bowel syndrome)   . Type II or unspecified type diabetes mellitus without mention of complication, not stated as uncontrolled 10/13/2013    Past Surgical History  Procedure Laterality Date  . Cholecystectomy  2005  . Unilateral salpingectomy  2009    Abdominal left salpingectomy  . Vaginal hysterectomy N/A 01/06/2013    Procedure: HYSTERECTOMY VAGINAL;  Surgeon: Osborne Oman, MD;  Location: [facility];  Service: Gynecology;  Laterality: N/A;  . Colonoscopy Left 04/29/2013    Procedure: COLONOSCOPY;  Surgeon: Arta Silence, MD;  Location: [facility];  Service: Endoscopy;  Laterality: Left;  . Flexible sigmoidoscopy N/A 11/09/2013    Procedure: FLEXIBLE SIGMOIDOSCOPY;  Surgeon: Arta Silence, MD;  Location: [facility];  Service: Endoscopy;  Laterality: N/A;    Medications:  HOME MEDS: Prior to Admission medications   Medication Sig Start Date End Date Taking? Authorizing Provider  albuterol (PROVENTIL HFA;VENTOLIN HFA) 108 (90 BASE) MCG/ACT inhaler Inhale 1-2 puffs into the lungs every 6 (six) hours as needed for wheezing or shortness of breath.   Yes Historical Provider, MD  glucose blood test strip Use as instructed 10/13/13  Yes Lance Bosch, NP  glucose monitoring kit (FREESTYLE) monitoring kit 1 each by Does not apply route as needed for other. 10/13/13  Yes Lance Bosch, NP  Lancets (FREESTYLE) lancets Use as instructed 10/13/13  Yes Lance Bosch,  NP  azithromycin (ZITHROMAX Z-PAK) 250 MG tablet Take 1 tablet (250 mg total) by mouth daily. Patient not taking: Reported on 02/20/2014 02/16/14   Nehemiah Settle A Upstill, PA-C  dicyclomine (BENTYL) 20 MG tablet Take 1 tablet (20 mg  total) by mouth every 6 (six) hours as needed for spasms (abdominal cramping). Patient not taking: Reported on 02/20/2014 10/10/13   Francine Graven, DO  HYDROcodone-acetaminophen (HYCET) 7.5-325 mg/15 ml solution Take 10 mLs by mouth every 4 (four) hours as needed for moderate pain or severe pain. Patient not taking: Reported on 02/20/2014 02/16/14   Nehemiah Settle A Upstill, PA-C  metFORMIN (GLUCOPHAGE) 500 MG tablet Take 1 tablet (500 mg total) by mouth 2 (two) times daily with a meal. Patient not taking: Reported on 02/20/2014 10/10/13   Lance Bosch, NP  ondansetron (ZOFRAN) 4 MG tablet Take 1 tablet (4 mg total) by mouth every 8 (eight) hours as needed for nausea or vomiting. Patient not taking: Reported on 02/20/2014 10/10/13   Francine Graven, DO     Allergies:  Allergies  Allergen Reactions  . Penicillins Anaphylaxis  . Shellfish Allergy Anaphylaxis  . Other     Mushroom-- swelling throat, eyes Animals with fur -- swelling throat, eyes    Social History:   reports that she quit smoking about 2 weeks ago. Her smoking use included Cigarettes. She has a 2.75 pack-year smoking history. She has never used smokeless tobacco. She reports that she drinks about 4.8 oz of alcohol per week. She reports that she does not use illicit drugs.  Family History: Family History  Problem Relation Age of Onset  . Hypertension Mother   . Diabetes Mother   . Cancer Father   . Hyperlipidemia Father   . Hypertension Father   . Allergies Mother   . Heart disease Mother   . Heart disease Maternal Grandmother      Physical Exam: Filed Vitals:   02/22/14 0745 02/22/14 1039 02/22/14 1310 02/22/14 1411  BP:  145/83  144/76  Pulse:  79  88  Temp:  97.8 F (36.6 C)  97.7 F (36.5 C)  TempSrc:  Oral  Oral  Resp:  20  20  Height:      Weight:      SpO2: 98% 99% 98% 100%   Blood pressure 144/76, pulse 88, temperature 97.7 F (36.5 C), temperature source Oral, resp. rate 20, height '5\' 5"'  (1.651 m), weight  112.946 kg (249 lb), last menstrual period 11/27/2012, SpO2 100 %.  GEN:  Pleasant  patient lying in the stretcher in no acute distress; cooperative with exam. PSYCH:  alert and oriented x4; does not appear anxious or depressed; affect is appropriate. HEENT: Mucous membranes pink and anicteric; PERRLA; EOM intact; no cervical lymphadenopathy nor thyromegaly or carotid bruit; no JVD; There were no stridor. Neck is very supple. Breasts:: Not examined CHEST WALL: No tenderness CHEST: Normal respiration, She still has significant wheezing.  No rales. HEART: Regular rate and rhythm.  There are no murmur, rub, or gallops.   BACK: No kyphosis or scoliosis; no CVA tenderness ABDOMEN: soft and non-tender; no masses, no organomegaly, normal abdominal bowel sounds; no pannus; no intertriginous candida. There is no rebound and no distention. Rectal Exam: Not done EXTREMITIES: No bone or joint deformity; age-appropriate arthropathy of the hands and knees; no edema; no ulcerations.  There is no calf tenderness. Genitalia: not examined PULSES: 2+ and symmetric SKIN: Normal hydration no rash or ulceration CNS: Cranial nerves 2-12 grossly intact no  focal lateralizing neurologic deficit.  Speech is fluent; uvula elevated with phonation, facial symmetry and tongue midline. DTR are normal bilaterally, cerebella exam is intact, barbinski is negative and strengths are equaled bilaterally.  No sensory loss.   Labs on Admission:  Basic Metabolic Panel:  Recent Labs Lab 02/20/14 1132  NA 136  K 4.0  CL 109  CO2 21  GLUCOSE 168*  BUN 11  CREATININE 0.75  CALCIUM 9.6   Liver Function Tests:  Recent Labs Lab 02/20/14 1132  AST 31  ALT 33  ALKPHOS 67  BILITOT 0.5  PROT 7.7  ALBUMIN 3.9   No results for input(s): LIPASE, AMYLASE in the last 168 hours. No results for input(s): AMMONIA in the last 168 hours. CBC:  Recent Labs Lab 02/20/14 1132 02/21/14 0300  WBC 7.9 11.7*  HGB 14.5 12.5  HCT  42.5 37.7  MCV 93.0 94.3  PLT 458* 445*   Cardiac Enzymes:  Recent Labs Lab 02/21/14 0025 02/21/14 0250 02/21/14 0621  TROPONINI <0.03 <0.03 <0.03    CBG:  Recent Labs Lab 02/21/14 1706 02/21/14 2100 02/22/14 0747 02/22/14 1122 02/22/14 1639  GLUCAP 188* 243* 166* 148* 251*   Assessment/Plan Asthma exacerbation Anxiety Atypical CP Obesity. Candida esosphagitis.  PLAN: Will continue with steroids, continue nebs, antibiotics.  I suspect she has candida esophagitis. Will give Nystatin swish and swallow.  Other plans as per orders.  Code Status: FULL Haskel Khan, MD. [facility] Pager [phone] 7pm to 7am.  02/22/2014, 6:28 PM

## 2014-02-23 DIAGNOSIS — J209 Acute bronchitis, unspecified: Secondary | ICD-10-CM | POA: Insufficient documentation

## 2014-02-23 LAB — GLUCOSE, CAPILLARY
Glucose-Capillary: 137 mg/dL — ABNORMAL HIGH (ref 70–99)
Glucose-Capillary: 197 mg/dL — ABNORMAL HIGH (ref 70–99)
Glucose-Capillary: 224 mg/dL — ABNORMAL HIGH (ref 70–99)
Glucose-Capillary: 233 mg/dL — ABNORMAL HIGH (ref 70–99)

## 2014-02-23 MED ORDER — PREDNISONE 5 MG PO TABS: 50.0000 mg | ORAL_TABLET | Freq: Every day | ORAL | Status: DC

## 2014-02-23 MED ORDER — NYSTATIN 100000 UNIT/ML MT SUSP: 5.0000 mL | Freq: Four times a day (QID) | OROMUCOSAL | Status: DC

## 2014-02-23 MED ORDER — ALBUTEROL SULFATE HFA 108 (90 BASE) MCG/ACT IN AERS: 1.0000 | INHALATION_SPRAY | Freq: Four times a day (QID) | RESPIRATORY_TRACT | Status: DC | PRN

## 2014-02-23 MED ORDER — IPRATROPIUM-ALBUTEROL 0.5-2.5 (3) MG/3ML IN SOLN: 3.0000 mL | Freq: Four times a day (QID) | RESPIRATORY_TRACT | Status: DC | PRN

## 2014-02-23 MED ORDER — ZOLPIDEM TARTRATE 5 MG PO TABS: 5.0000 mg | ORAL_TABLET | Freq: Every evening | ORAL | Status: DC | PRN

## 2014-02-23 MED ORDER — LEVOFLOXACIN 750 MG PO TABS: 750.0000 mg | ORAL_TABLET | Freq: Every day | ORAL | Status: DC

## 2014-02-23 NOTE — Discharge Instructions (Signed)
Asthma Asthma is a recurring condition in which the airways tighten and narrow. Asthma can make it difficult to breathe. It can cause coughing, wheezing, and shortness of breath. Asthma episodes, also called asthma attacks, range from minor to life-threatening. Asthma cannot be cured, but medicines and lifestyle changes can help control it. CAUSES Asthma is believed to be caused by inherited (genetic) and environmental factors, but its exact cause is unknown. Asthma may be triggered by allergens, lung infections, or irritants in the air. Asthma triggers are different for each person. Common triggers include:   Animal dander.  Dust mites.  Cockroaches.  Pollen from trees or grass.  Mold.  Smoke.  Air pollutants such as dust, household cleaners, hair sprays, aerosol sprays, paint fumes, strong chemicals, or strong odors.  Cold air, weather changes, and winds (which increase molds and pollens in the air).  Strong emotional expressions such as crying or laughing hard.  Stress.  Certain medicines (such as aspirin) or types of drugs (such as beta-blockers).  Sulfites in foods and drinks. Foods and drinks that may contain sulfites include dried fruit, potato chips, and sparkling grape juice.  Infections or inflammatory conditions such as the flu, a cold, or an inflammation of the nasal membranes (rhinitis).  Gastroesophageal reflux disease (GERD).  Exercise or strenuous activity. SYMPTOMS Symptoms may occur immediately after asthma is triggered or many hours later. Symptoms include:  Wheezing.  Excessive nighttime or early morning coughing.  Frequent or severe coughing with a common cold.  Chest tightness.  Shortness of breath. DIAGNOSIS  The diagnosis of asthma is made by a review of your medical history and a physical exam. Tests may also be performed. These may include:  Lung function studies. These tests show how much air you breathe in and out.  Allergy  tests.  Imaging tests such as X-rays. TREATMENT  Asthma cannot be cured, but it can usually be controlled. Treatment involves identifying and avoiding your asthma triggers. It also involves medicines. There are 2 classes of medicine used for asthma treatment:   Controller medicines. These prevent asthma symptoms from occurring. They are usually taken every day.  Reliever or rescue medicines. These quickly relieve asthma symptoms. They are used as needed and provide short-term relief. Your health care provider will help you create an asthma action plan. An asthma action plan is a written plan for managing and treating your asthma attacks. It includes a list of your asthma triggers and how they may be avoided. It also includes information on when medicines should be taken and when their dosage should be changed. An action plan may also involve the use of a device called a peak flow meter. A peak flow meter measures how well the lungs are working. It helps you monitor your condition. HOME CARE INSTRUCTIONS   Take medicines only as directed by your health care provider. Speak with your health care provider if you have questions about how or when to take the medicines.  Use a peak flow meter as directed by your health care provider. Record and keep track of readings.  Understand and use the action plan to help minimize or stop an asthma attack without needing to seek medical care.  Control your home environment in the following ways to help prevent asthma attacks:  Do not smoke. Avoid being exposed to secondhand smoke.  Change your heating and air conditioning filter regularly.  Limit your use of fireplaces and wood stoves.  Get rid of pests (such as roaches and  mice) and their droppings.  Throw away plants if you see mold on them.  Clean your floors and dust regularly. Use unscented cleaning products.  Try to have someone else vacuum for you regularly. Stay out of rooms while they are  being vacuumed and for a short while afterward. If you vacuum, use a dust mask from a hardware store, a double-layered or microfilter vacuum cleaner bag, or a vacuum cleaner with a HEPA filter.  Replace carpet with wood, tile, or vinyl flooring. Carpet can trap dander and dust.  Use allergy-proof pillows, mattress covers, and box spring covers.  Wash bed sheets and blankets every week in hot water and dry them in a dryer.  Use blankets that are made of polyester or cotton.  Clean bathrooms and kitchens with bleach. If possible, have someone repaint the walls in these rooms with mold-resistant paint. Keep out of the rooms that are being cleaned and painted.  Wash hands frequently. SEEK MEDICAL CARE IF:   You have wheezing, shortness of breath, or a cough even if taking medicine to prevent attacks.  The colored mucus you cough up (sputum) is thicker than usual.  Your sputum changes from clear or white to yellow, green, gray, or bloody.  You have any problems that may be related to the medicines you are taking (such as a rash, itching, swelling, or trouble breathing).  You are using a reliever medicine more than 2-3 times per week.  Your peak flow is still at 50-79% of your personal best after following your action plan for 1 hour.  You have a fever. SEEK IMMEDIATE MEDICAL CARE IF:   You seem to be getting worse and are unresponsive to treatment during an asthma attack.  You are short of breath even at rest.  You get short of breath when doing very little physical activity.  You have difficulty eating, drinking, or talking due to asthma symptoms.  You develop chest pain.  You develop a fast heartbeat.  You have a bluish color to your lips or fingernails.  You are light-headed, dizzy, or faint.  Your peak flow is less than 50% of your personal best. MAKE SURE YOU:   Understand these instructions.  Will watch your condition.  Will get help right away if you are not  doing well or get worse. Document Released: 02/03/2005 Document Revised: 06/20/2013 Document Reviewed: 09/02/2012 [facility] Patient Information 2015 [place], [place]. This information is not intended to replace advice given to you by your health care provider. Make sure you discuss any questions you have with your health care provider. Acute Bronchitis Bronchitis is inflammation of the airways that extend from the windpipe into the lungs (bronchi). The inflammation often causes mucus to develop. This leads to a cough, which is the most common symptom of bronchitis.  In acute bronchitis, the condition usually develops suddenly and goes away over time, usually in a couple weeks. Smoking, allergies, and asthma can make bronchitis worse. Repeated episodes of bronchitis may cause further lung problems.  CAUSES Acute bronchitis is most often caused by the same virus that causes a cold. The virus can spread from person to person (contagious) through coughing, sneezing, and touching contaminated objects. SIGNS AND SYMPTOMS   Cough.   Fever.   Coughing up mucus.   Body aches.   Chest congestion.   Chills.   Shortness of breath.   Sore throat.  DIAGNOSIS  Acute bronchitis is usually diagnosed through a physical exam. Your health care provider will also  ask you questions about your medical history. Tests, such as chest X-rays, are sometimes done to rule out other conditions.  TREATMENT  Acute bronchitis usually goes away in a couple weeks. Oftentimes, no medical treatment is necessary. Medicines are sometimes given for relief of fever or cough. Antibiotic medicines are usually not needed but may be prescribed in certain situations. In some cases, an inhaler may be recommended to help reduce shortness of breath and control the cough. A cool mist vaporizer may also be used to help thin bronchial secretions and make it easier to clear the chest.  HOME CARE INSTRUCTIONS  Get plenty of rest.    Drink enough fluids to keep your urine clear or pale yellow (unless you have a medical condition that requires fluid restriction). Increasing fluids may help thin your respiratory secretions (sputum) and reduce chest congestion, and it will prevent dehydration.   Take medicines only as directed by your health care provider.  If you were prescribed an antibiotic medicine, finish it all even if you start to feel better.  Avoid smoking and secondhand smoke. Exposure to cigarette smoke or irritating chemicals will make bronchitis worse. If you are a smoker, consider using nicotine gum or skin patches to help control withdrawal symptoms. Quitting smoking will help your lungs heal faster.   Reduce the chances of another bout of acute bronchitis by washing your hands frequently, avoiding people with cold symptoms, and trying not to touch your hands to your mouth, nose, or eyes.   Keep all follow-up visits as directed by your health care provider.  SEEK MEDICAL CARE IF: Your symptoms do not improve after 1 week of treatment.  SEEK IMMEDIATE MEDICAL CARE IF:  You develop an increased fever or chills.   You have chest pain.   You have severe shortness of breath.  You have bloody sputum.   You develop dehydration.  You faint or repeatedly feel like you are going to pass out.  You develop repeated vomiting.  You develop a severe headache. MAKE SURE YOU:   Understand these instructions.  Will watch your condition.  Will get help right away if you are not doing well or get worse. Document Released: 03/13/2004 Document Revised: 06/20/2013 Document Reviewed: 07/27/2012 [facility] Patient Information 2015 [place], [place]. This information is not intended to replace advice given to you by your health care provider. Make sure you discuss any questions you have with your health care provider.

## 2014-02-23 NOTE — Discharge Summary (Signed)
Physician Discharge Summary  Marisa [name] FHL:[id number] DOB: [birth year] DOA: 02/20/2014  PCP: Angelica Chessman, MD  Admit date: 02/20/2014 Discharge date: 02/23/2014  Recommendations for Outpatient Follow-up:  1. Take Levaquin for 7 days on discharge. 2. Please take prednisone as prescribed. Start from 50 mg a day, taper down by 5 mg a day and then stop.  Discharge Diagnoses:  Principal Problem:   Asthma exacerbation Active Problems:   Well controlled type 2 diabetes mellitus   Sore throat   Chest pain   Tachycardia    Discharge Condition: stable   Diet recommendation: as tolerated   History of present illness:  39 y.o. female admitted for Asthma exacerbation, atypical CP, and anxiety. Patient improved with nebulizer treatments, steroids and abx.   Hospital Course:   Principal Problem:   Asthma exacerbation - Home DME nebulizer machine ordered and nebulizer meds per [facility] services. - she has gotten prescription for neb treatments as well - she will take Levaquin on discharge for 7 days - she will take prednisone as prescribed - note for work written    Signed:  Leisa Lenz, MD  [facility] 02/23/2014, 11:26 AM  Pager #: [phone]   Discharge Exam: Filed Vitals:   02/23/14 0449  BP: 136/65  Pulse: 85  Temp: 98.4 F (36.9 C)  Resp: 20   Filed Vitals:   02/22/14 2040 02/22/14 2104 02/23/14 0449 02/23/14 0735  BP:  125/57 136/65   Pulse:  102 85   Temp:  98 F (36.7 C) 98.4 F (36.9 C)   TempSrc:  Oral Oral   Resp:  20 20   Height:      Weight:      SpO2: 96% 96% 98% 96%    General: Pt is alert, follows commands appropriately, not in acute distress Cardiovascular: Regular rate and rhythm, S1/S2 +, no murmurs Respiratory: Clear to auscultation bilaterally, no wheezing, no crackles, no rhonchi Abdominal: Soft, non tender, non distended, bowel sounds +, no guarding Extremities: no edema, no cyanosis, pulses palpable bilaterally DP and  PT Neuro: Grossly nonfocal  Discharge Instructions  Discharge Instructions    Call MD for:  difficulty breathing, headache or visual disturbances    Complete by:  As directed      Call MD for:  persistant dizziness or light-headedness    Complete by:  As directed      Call MD for:  persistant nausea and vomiting    Complete by:  As directed      Call MD for:  severe uncontrolled pain    Complete by:  As directed      Diet - low sodium heart healthy    Complete by:  As directed      Discharge instructions    Complete by:  As directed   1. Take Levaquin for 7 days on discharge. 2. Please take prednisone as prescribed. Start from 50 mg a day, taper down by 5 mg a day and then stop.     Increase activity slowly    Complete by:  As directed             Medication List    STOP taking these medications        azithromycin 250 MG tablet  Commonly known as:  ZITHROMAX Z-PAK     dicyclomine 20 MG tablet  Commonly known as:  BENTYL     HYDROcodone-acetaminophen 7.5-325 mg/15 ml solution  Commonly known as:  HYCET     ondansetron 4  MG tablet  Commonly known as:  ZOFRAN      TAKE these medications        albuterol 108 (90 BASE) MCG/ACT inhaler  Commonly known as:  PROVENTIL HFA;VENTOLIN HFA  Inhale 1-2 puffs into the lungs every 6 (six) hours as needed for wheezing or shortness of breath.     freestyle lancets  Use as instructed     glucose blood test strip  Use as instructed     glucose monitoring kit monitoring kit  1 each by Does not apply route as needed for other.     ipratropium-albuterol 0.5-2.5 (3) MG/3ML Soln  Commonly known as:  DUONEB  Take 3 mLs by nebulization every 6 (six) hours as needed.     levofloxacin 750 MG tablet  Commonly known as:  LEVAQUIN  Take 1 tablet (750 mg total) by mouth daily.     metFORMIN 500 MG tablet  Commonly known as:  GLUCOPHAGE  Take 1 tablet (500 mg total) by mouth 2 (two) times daily with a meal.     nystatin 100000  UNIT/ML suspension  Commonly known as:  MYCOSTATIN  Take 5 mLs (500,000 Units total) by mouth 4 (four) times daily.     predniSONE 5 MG tablet  Commonly known as:  DELTASONE  Take 10 tablets (50 mg total) by mouth daily with breakfast.     zolpidem 5 MG tablet  Commonly known as:  AMBIEN  Take 1 tablet (5 mg total) by mouth at bedtime as needed for sleep.            Follow-up Information    Follow up with Angelica Chessman, MD. Schedule an appointment as soon as possible for a visit in 1 week.   Specialty:  Internal Medicine   Why:  Follow up appt after recent hospitalization   Contact information:   [place] [address] [phone]        The results of significant diagnostics from this hospitalization (including imaging, microbiology, ancillary and laboratory) are listed below for reference.    Significant Diagnostic Studies: Dg Chest 2 View  02/20/2014   CLINICAL DATA:  Shortness of breath.  EXAM: CHEST  2 VIEW  COMPARISON:  October 10, 2013.  FINDINGS: The heart size and mediastinal contours are within normal limits. Both lungs are clear. No pneumothorax or pleural effusion is noted. The visualized skeletal structures are unremarkable.  IMPRESSION: No acute cardiopulmonary abnormality seen.   Electronically Signed   By: Sabino Dick M.D.   On: 02/20/2014 13:53   Ct Angio Chest Pe W/cm &/or Wo Cm  02/20/2014   CLINICAL DATA:  Acute onset of shortness of breath earlier today. Patient currently being treated for tonsillitis. Current history of asthma but the patient has run out of her albuterol inhaler.  EXAM: CT ANGIOGRAPHY CHEST WITH CONTRAST  TECHNIQUE: Multidetector CT imaging of the chest was performed using the standard protocol during bolus administration of intravenous contrast. Multiplanar CT image reconstructions and MIPs were obtained to evaluate the vascular anatomy.  CONTRAST:  134m OMNIPAQUE IOHEXOL 350 MG/ML IV.  COMPARISON:  None.  FINDINGS:  Contrast opacification of the pulmonary arteries is fair to good. No filling defects within either main pulmonary artery or their branches in either lung to suggest pulmonary embolism. Heart size upper normal. No visible coronary atherosclerosis. No pericardial effusion. No visible atherosclerosis involving the thoracic or upper abdominal aorta. No made of bovine aortic arch anatomy (left common carotid artery arises  from the innominate artery).  Markedly suboptimal inspiration which accounts for the atelectasis in the lower lobes. Lungs otherwise clear without localized airspace consolidation, interstitial disease, or parenchymal nodules. No pleural effusions. Central airways patent with moderate bronchial wall thickening.  No significant mediastinal, hilar or axillary lymphadenopathy. Mild enlargement of the visualized thyroid gland without nodularity.  Visualized upper abdomen unremarkable for the early phase of enhancement. Bone window images unremarkable.  Review of the MIP images confirms the above findings.  IMPRESSION: 1. No evidence of pulmonary embolism. 2. Suboptimal inspiration accounts for atelectasis in the lower lobes. Moderate central bronchial wall thickening consistent with asthma and/or bronchitis. No acute cardiopulmonary disease otherwise.   Electronically Signed   By: Evangeline Dakin M.D.   On: 02/20/2014 17:51    Microbiology: Recent Results (from the past 240 hour(s))  Rapid strep screen     Status: None   Collection Time: 02/20/14  9:40 PM  Result Value Ref Range Status   Streptococcus, Group A Screen (Direct) NEGATIVE NEGATIVE Final    Comment: (NOTE) A Rapid Antigen test may result negative if the antigen level in the sample is below the detection level of this test. The FDA has not cleared this test as a stand-alone test therefore the rapid antigen negative result has reflexed to a Group A Strep culture.   Culture, Group A Strep     Status: None   Collection Time:  02/20/14  9:40 PM  Result Value Ref Range Status   Specimen Description THROAT  Final   Special Requests NONE  Final   Culture   Final    STREPTOCOCCUS,BETA HEMOLYTIC NOT GROUP A Performed at [facility]    Report Status 02/22/2014 FINAL  Final     Labs: Basic Metabolic Panel:  Recent Labs Lab 02/20/14 1132  NA 136  K 4.0  CL 109  CO2 21  GLUCOSE 168*  BUN 11  CREATININE 0.75  CALCIUM 9.6   Liver Function Tests:  Recent Labs Lab 02/20/14 1132  AST 31  ALT 33  ALKPHOS 67  BILITOT 0.5  PROT 7.7  ALBUMIN 3.9   No results for input(s): LIPASE, AMYLASE in the last 168 hours. No results for input(s): AMMONIA in the last 168 hours. CBC:  Recent Labs Lab 02/20/14 1132 02/21/14 0300  WBC 7.9 11.7*  HGB 14.5 12.5  HCT 42.5 37.7  MCV 93.0 94.3  PLT 458* 445*   Cardiac Enzymes:  Recent Labs Lab 02/21/14 0025 02/21/14 0250 02/21/14 0621  TROPONINI <0.03 <0.03 <0.03   BNP: BNP (last 3 results) No results for input(s): PROBNP in the last 8760 hours. CBG:  Recent Labs Lab 02/22/14 1639 02/22/14 2100 02/23/14 0007 02/23/14 0447 02/23/14 0718  GLUCAP 251* 255* 233* 197* 137*    Time coordinating discharge: Over 30 minutes

## 2014-02-23 NOTE — Progress Notes (Signed)
[facility] is providing the following services: Nebulizer  If patient discharges after hours, please call [phone].   Linward Headland 02/23/2014, 11:32 AM

## 2014-02-24 ENCOUNTER — Telehealth: Payer: Self-pay

## 2014-02-24 NOTE — Telephone Encounter (Signed)
Patient hospitalized 02/20/14-02/22/14 for asthma exacerbation. Hospital follow-up appointment scheduled on 03/06/14 at 1615 with Dr. Doreene Burke. Call placed to patient for follow-up and to ensure patient obtained discharge medications. Unable to reach patient at listed number; number no longer in service. In addition, call placed to patient's listed emergency contact and number no longer in service.

## 2014-02-28 ENCOUNTER — Emergency Department (HOSPITAL_COMMUNITY): Payer: 59

## 2014-02-28 ENCOUNTER — Inpatient Hospital Stay (HOSPITAL_COMMUNITY)
Admission: EM | Admit: 2014-02-28 | Discharge: 2014-03-04 | DRG: 202 | Disposition: A | Discharge status: Home / Self Care | Payer: 59 | Attending: Internal Medicine | Admitting: Internal Medicine

## 2014-02-28 ENCOUNTER — Encounter (HOSPITAL_COMMUNITY): Payer: Self-pay | Admitting: Adult Health

## 2014-02-28 DIAGNOSIS — R102 Pelvic and perineal pain: Secondary | ICD-10-CM | POA: Diagnosis present

## 2014-02-28 DIAGNOSIS — G4733 Obstructive sleep apnea (adult) (pediatric): Secondary | ICD-10-CM | POA: Diagnosis present

## 2014-02-28 DIAGNOSIS — Z9049 Acquired absence of other specified parts of digestive tract: Secondary | ICD-10-CM | POA: Diagnosis present

## 2014-02-28 DIAGNOSIS — J45901 Unspecified asthma with (acute) exacerbation: Principal | ICD-10-CM | POA: Diagnosis present

## 2014-02-28 DIAGNOSIS — R0789 Other chest pain: Secondary | ICD-10-CM | POA: Diagnosis present

## 2014-02-28 DIAGNOSIS — Z85038 Personal history of other malignant neoplasm of large intestine: Secondary | ICD-10-CM

## 2014-02-28 DIAGNOSIS — D649 Anemia, unspecified: Secondary | ICD-10-CM | POA: Diagnosis present

## 2014-02-28 DIAGNOSIS — F410 Panic disorder [episodic paroxysmal anxiety] without agoraphobia: Secondary | ICD-10-CM | POA: Diagnosis present

## 2014-02-28 DIAGNOSIS — Z6841 Body Mass Index (BMI) 40.0 and over, adult: Secondary | ICD-10-CM | POA: Diagnosis not present

## 2014-02-28 DIAGNOSIS — E873 Alkalosis: Secondary | ICD-10-CM | POA: Diagnosis present

## 2014-02-28 DIAGNOSIS — E1165 Type 2 diabetes mellitus with hyperglycemia: Secondary | ICD-10-CM | POA: Diagnosis present

## 2014-02-28 DIAGNOSIS — Z90710 Acquired absence of both cervix and uterus: Secondary | ICD-10-CM

## 2014-02-28 DIAGNOSIS — R0602 Shortness of breath: Secondary | ICD-10-CM | POA: Diagnosis present

## 2014-02-28 DIAGNOSIS — F419 Anxiety disorder, unspecified: Secondary | ICD-10-CM | POA: Diagnosis present

## 2014-02-28 DIAGNOSIS — Z8674 Personal history of sudden cardiac arrest: Secondary | ICD-10-CM

## 2014-02-28 DIAGNOSIS — Z7952 Long term (current) use of systemic steroids: Secondary | ICD-10-CM

## 2014-02-28 DIAGNOSIS — R059 Cough, unspecified: Secondary | ICD-10-CM

## 2014-02-28 DIAGNOSIS — G8929 Other chronic pain: Secondary | ICD-10-CM | POA: Diagnosis present

## 2014-02-28 DIAGNOSIS — Z880 Allergy status to penicillin: Secondary | ICD-10-CM

## 2014-02-28 DIAGNOSIS — K589 Irritable bowel syndrome without diarrhea: Secondary | ICD-10-CM | POA: Diagnosis present

## 2014-02-28 DIAGNOSIS — F17200 Nicotine dependence, unspecified, uncomplicated: Secondary | ICD-10-CM | POA: Diagnosis present

## 2014-02-28 DIAGNOSIS — D72829 Elevated white blood cell count, unspecified: Secondary | ICD-10-CM | POA: Diagnosis present

## 2014-02-28 DIAGNOSIS — J9601 Acute respiratory failure with hypoxia: Secondary | ICD-10-CM | POA: Diagnosis present

## 2014-02-28 DIAGNOSIS — G43909 Migraine, unspecified, not intractable, without status migrainosus: Secondary | ICD-10-CM | POA: Diagnosis present

## 2014-02-28 DIAGNOSIS — F17210 Nicotine dependence, cigarettes, uncomplicated: Secondary | ICD-10-CM | POA: Diagnosis present

## 2014-02-28 DIAGNOSIS — R05 Cough: Secondary | ICD-10-CM

## 2014-02-28 DIAGNOSIS — E119 Type 2 diabetes mellitus without complications: Secondary | ICD-10-CM

## 2014-02-28 DIAGNOSIS — I469 Cardiac arrest, cause unspecified: Secondary | ICD-10-CM

## 2014-02-28 DIAGNOSIS — Z882 Allergy status to sulfonamides status: Secondary | ICD-10-CM

## 2014-02-28 LAB — BASIC METABOLIC PANEL
Anion gap: 14 (ref 5–15)
BUN: 13 mg/dL (ref 6–23)
CO2: 23 mmol/L (ref 19–32)
Calcium: 9.9 mg/dL (ref 8.4–10.5)
Chloride: 100 mEq/L (ref 96–112)
Creatinine, Ser: 0.83 mg/dL (ref 0.50–1.10)
GFR calc Af Amer: >90 mL/min (ref >90)
GFR calc non Af Amer: 88 mL/min — ABNORMAL LOW (ref >90)
Glucose, Bld: 138 mg/dL — ABNORMAL HIGH (ref 70–99)
Potassium: 4.2 mmol/L (ref 3.5–5.1)
Sodium: 137 mmol/L (ref 135–145)

## 2014-02-28 LAB — CBC
HCT: 40.4 % (ref 36.0–46.0)
Hemoglobin: 13.9 g/dL (ref 12.0–15.0)
MCH: 31.6 pg (ref 26.0–34.0)
MCHC: 34.4 g/dL (ref 30.0–36.0)
MCV: 91.8 fL (ref 78.0–100.0)
Platelets: 421 10*3/uL — ABNORMAL HIGH (ref 150–400)
RBC: 4.4 MIL/uL (ref 3.87–5.11)
RDW: 13.1 % (ref 11.5–15.5)
WBC: 19.7 10*3/uL — ABNORMAL HIGH (ref 4.0–10.5)

## 2014-02-28 LAB — I-STAT ARTERIAL BLOOD GAS, ED
Acid-Base Excess: 1 mmol/L (ref 0.0–2.0)
Bicarbonate: 25.4 mEq/L — ABNORMAL HIGH (ref 20.0–24.0)
O2 Saturation: 97 %
Patient temperature: 98.6
TCO2: 27 mmol/L (ref 0–100)
pCO2 arterial: 38.7 mmHg (ref 35.0–45.0)
pH, Arterial: 7.425 (ref 7.350–7.450)
pO2, Arterial: 85 mmHg (ref 80.0–100.0)

## 2014-02-28 LAB — I-STAT TROPONIN, ED: Troponin i, poc: 0.02 ng/mL (ref 0.00–0.08)

## 2014-02-28 LAB — GLUCOSE, CAPILLARY: Glucose-Capillary: 139 mg/dL — ABNORMAL HIGH (ref 70–99)

## 2014-02-28 LAB — D-DIMER, QUANTITATIVE (NOT AT ARMC): D-Dimer, Quant: <0.27 ug/mL-FEU (ref 0.00–0.48)

## 2014-02-28 LAB — CBG MONITORING, ED: Glucose-Capillary: 154 mg/dL — ABNORMAL HIGH (ref 70–99)

## 2014-02-28 LAB — TROPONIN I: Troponin I: <0.03 ng/mL (ref <0.031)

## 2014-02-28 LAB — STREP PNEUMONIAE URINARY ANTIGEN: Strep Pneumo Urinary Antigen: NEGATIVE

## 2014-02-28 MED ORDER — IPRATROPIUM BROMIDE 0.02 % IN SOLN
0.5000 mg | Freq: Once | RESPIRATORY_TRACT | Status: AC
  Administered 2014-02-28: 0.5 mg via RESPIRATORY_TRACT
  Filled 2014-02-28: qty 2.5

## 2014-02-28 MED ORDER — NYSTATIN 100000 UNIT/ML MT SUSP
5.0000 mL | Freq: Four times a day (QID) | OROMUCOSAL | Status: DC
  Administered 2014-02-28 – 2014-03-04 (×15): 500000 [IU] via ORAL
  Filled 2014-02-28 (×17): qty 5

## 2014-02-28 MED ORDER — SODIUM CHLORIDE 0.9 % IV BOLUS (SEPSIS)
1000.0000 mL | Freq: Once | INTRAVENOUS | Status: AC
  Administered 2014-02-28: 1000 mL via INTRAVENOUS

## 2014-02-28 MED ORDER — DM-GUAIFENESIN ER 30-600 MG PO TB12
1.0000 | ORAL_TABLET | Freq: Two times a day (BID) | ORAL | Status: DC
  Administered 2014-02-28 – 2014-03-04 (×8): 1 via ORAL
  Filled 2014-02-28 (×9): qty 1

## 2014-02-28 MED ORDER — LORAZEPAM 2 MG/ML IJ SOLN
1.0000 mg | Freq: Once | INTRAMUSCULAR | Status: AC
  Administered 2014-02-28: 1 mg via INTRAVENOUS
  Filled 2014-02-28: qty 1

## 2014-02-28 MED ORDER — ALBUTEROL (5 MG/ML) CONTINUOUS INHALATION SOLN: 10.0000 mg/h | INHALATION_SOLUTION | Freq: Once | RESPIRATORY_TRACT | Status: DC

## 2014-02-28 MED ORDER — METHYLPREDNISOLONE SODIUM SUCC 125 MG IJ SOLR
125.0000 mg | Freq: Once | INTRAMUSCULAR | Status: AC
  Administered 2014-02-28: 125 mg via INTRAVENOUS
  Filled 2014-02-28: qty 2

## 2014-02-28 MED ORDER — IPRATROPIUM-ALBUTEROL 0.5-2.5 (3) MG/3ML IN SOLN: 3.0000 mL | RESPIRATORY_TRACT | Status: DC | PRN

## 2014-02-28 MED ORDER — ZOLPIDEM TARTRATE 5 MG PO TABS
5.0000 mg | ORAL_TABLET | Freq: Every evening | ORAL | Status: DC | PRN
  Administered 2014-02-28 – 2014-03-03 (×4): 5 mg via ORAL
  Filled 2014-02-28 (×4): qty 1

## 2014-02-28 MED ORDER — ACETAMINOPHEN 160 MG/5ML PO SOLN
650.0000 mg | Freq: Four times a day (QID) | ORAL | Status: DC | PRN
  Administered 2014-02-28 – 2014-03-01 (×3): 650 mg via ORAL
  Filled 2014-02-28 (×2): qty 20.3

## 2014-02-28 MED ORDER — ALBUTEROL (5 MG/ML) CONTINUOUS INHALATION SOLN
10.0000 mg/h | INHALATION_SOLUTION | Freq: Once | RESPIRATORY_TRACT | Status: AC
  Administered 2014-02-28: 10 mg/h via RESPIRATORY_TRACT
  Filled 2014-02-28: qty 20

## 2014-02-28 MED ORDER — SODIUM CHLORIDE 0.9 % IV SOLN
INTRAVENOUS | Status: DC
  Administered 2014-02-28 – 2014-03-01 (×2): via INTRAVENOUS

## 2014-02-28 MED ORDER — LORAZEPAM 2 MG/ML IJ SOLN
0.5000 mg | INTRAMUSCULAR | Status: DC | PRN
  Filled 2014-02-28: qty 1

## 2014-02-28 MED ORDER — LEVALBUTEROL HCL 0.63 MG/3ML IN NEBU: 0.6300 mg | INHALATION_SOLUTION | Freq: Four times a day (QID) | RESPIRATORY_TRACT | Status: DC | PRN

## 2014-02-28 MED ORDER — METHYLPREDNISOLONE SODIUM SUCC 125 MG IJ SOLR
80.0000 mg | Freq: Three times a day (TID) | INTRAMUSCULAR | Status: DC
  Administered 2014-02-28 – 2014-03-02 (×5): 80 mg via INTRAVENOUS
  Filled 2014-02-28 (×4): qty 1.28
  Filled 2014-02-28: qty 2
  Filled 2014-02-28: qty 1.28
  Filled 2014-02-28: qty 2
  Filled 2014-02-28: qty 1.28

## 2014-02-28 MED ORDER — HEPARIN SODIUM (PORCINE) 5000 UNIT/ML IJ SOLN
5000.0000 [IU] | Freq: Three times a day (TID) | INTRAMUSCULAR | Status: DC
  Administered 2014-02-28 – 2014-03-04 (×12): 5000 [IU] via SUBCUTANEOUS
  Filled 2014-02-28 (×14): qty 1

## 2014-02-28 MED ORDER — LORAZEPAM 2 MG/ML IJ SOLN
0.5000 mg | INTRAMUSCULAR | Status: DC | PRN
  Administered 2014-02-28 – 2014-03-01 (×2): 0.5 mg via INTRAVENOUS
  Filled 2014-02-28: qty 1

## 2014-02-28 MED ORDER — LORAZEPAM 2 MG/ML IJ SOLN: 0.5000 mg | INTRAMUSCULAR | Status: DC | PRN

## 2014-02-28 MED ORDER — IPRATROPIUM-ALBUTEROL 0.5-2.5 (3) MG/3ML IN SOLN
3.0000 mL | Freq: Once | RESPIRATORY_TRACT | Status: AC
  Administered 2014-02-28: 3 mL via RESPIRATORY_TRACT
  Filled 2014-02-28: qty 3

## 2014-02-28 MED ORDER — MAGNESIUM SULFATE 2 GM/50ML IV SOLN
2.0000 g | Freq: Once | INTRAVENOUS | Status: AC
  Administered 2014-02-28: 2 g via INTRAVENOUS
  Filled 2014-02-28: qty 50

## 2014-02-28 NOTE — ED Notes (Signed)
Presents with SOB and chest tightness began after getting home from hospital 4 days ago, she was admitted with same.-using inhaler at home with no relief, she has labored respirations, diminished breath sounds and productive cough, more diminished on right side than left, mucous is green and yellow and thick. sats RA 97%

## 2014-02-28 NOTE — ED Notes (Signed)
Pt sts "Every time I doze off my chest feels tight and I can't breath."

## 2014-02-28 NOTE — ED Notes (Signed)
Pt appears lethargic, alert. Family wanting to speak to MD. Dr.Goldston aware

## 2014-02-28 NOTE — ED Notes (Signed)
Informed patient of delay, awaiting EDP reassessment. Pt appears tired. EDP aware. Pt with increased coughing and wheezes noted bilaterally

## 2014-02-28 NOTE — ED Provider Notes (Signed)
CSN: [id number]     Arrival date & time 02/28/14  1621 History   First MD Initiated Contact with Patient 02/28/14 1640     Chief Complaint  Patient presents with  . Shortness of Breath     (Consider location/radiation/quality/duration/timing/severity/associated sxs/prior Treatment) HPI  39 year old female presents with severe respiratory distress. She states that she was discharged from the hospital 5 days ago and has been progressively worsening. She was admitted for an asthma exacerbation at that time and did improve but is now getting worse. Feels chest tightness, has cough, wheezing, and trouble breathing. No fevers. Was discharged home on antibiotics. This feels more severe than her typical asthma exacerbations. She has had to be intubated in the past for similar severity.  Past Medical History  Diagnosis Date  . Asthma   . EP (ectopic pregnancy)   . Ovarian tumor   . Tumor, thyroid   . Sleep apnea   . Chronic headaches   . Irregular heartbeat   . Chronic female pelvic pain 10/11/2012  . Abnormal uterine bleeding (AUB) 10/11/2012  . Cardiac arrest 1992    during delivery  . Shortness of breath   . Anemia   . Cancer     colon CA  . Chronic diarrhea   . Gait instability   . Frequent falls   . Anxiety   . Panic attack   . IBS (irritable bowel syndrome)   . Type II or unspecified type diabetes mellitus without mention of complication, not stated as uncontrolled 10/13/2013   Past Surgical History  Procedure Laterality Date  . Cholecystectomy  2005  . Unilateral salpingectomy  2009    Abdominal left salpingectomy  . Vaginal hysterectomy N/A 01/06/2013    Procedure: HYSTERECTOMY VAGINAL;  Surgeon: Osborne Oman, MD;  Location: [facility];  Service: Gynecology;  Laterality: N/A;  . Colonoscopy Left 04/29/2013    Procedure: COLONOSCOPY;  Surgeon: Arta Silence, MD;  Location: [facility];  Service: Endoscopy;  Laterality: Left;  . Flexible sigmoidoscopy N/A 11/09/2013   Procedure: FLEXIBLE SIGMOIDOSCOPY;  Surgeon: Arta Silence, MD;  Location: [facility];  Service: Endoscopy;  Laterality: N/A;   Family History  Problem Relation Age of Onset  . Hypertension Mother   . Diabetes Mother   . Cancer Father   . Hyperlipidemia Father   . Hypertension Father   . Allergies Mother   . Heart disease Mother   . Heart disease Maternal Grandmother    History  Substance Use Topics  . Smoking status: Former Smoker -- 0.25 packs/day for 11 years    Types: Cigarettes    Quit date: 02/06/2014  . Smokeless tobacco: Never Used     Comment: 1 pack per week  . Alcohol Use: 4.8 oz/week    7 Glasses of wine, 1 Cans of beer per week     Comment: 1 bottle of wine per week - 02/20/14 - "I  have not drank in 8 months"   OB History    Gravida Para Term Preterm AB TAB SAB Ectopic Multiple Living   '3 1 1  2 1  1  1     ' Review of Systems  Unable to perform ROS: Severe respiratory distress      Allergies  Penicillins; Shellfish allergy; and Other  Home Medications   Prior to Admission medications   Medication Sig Start Date End Date Taking? Authorizing Provider  albuterol (PROVENTIL HFA;VENTOLIN HFA) 108 (90 BASE) MCG/ACT inhaler Inhale 1-2 puffs into the lungs every 6 (  six) hours as needed for wheezing or shortness of breath. 02/23/14   Robbie Lis, MD  glucose blood test strip Use as instructed 10/13/13   Lance Bosch, NP  glucose monitoring kit (FREESTYLE) monitoring kit 1 each by Does not apply route as needed for other. 10/13/13   Lance Bosch, NP  ipratropium-albuterol (DUONEB) 0.5-2.5 (3) MG/3ML SOLN Take 3 mLs by nebulization every 6 (six) hours as needed. 02/23/14   Robbie Lis, MD  Lancets (FREESTYLE) lancets Use as instructed 10/13/13   Lance Bosch, NP  levofloxacin (LEVAQUIN) 750 MG tablet Take 1 tablet (750 mg total) by mouth daily. 02/23/14   Robbie Lis, MD  metFORMIN (GLUCOPHAGE) 500 MG tablet Take 1 tablet (500 mg total) by mouth 2 (two) times  daily with a meal. Patient not taking: Reported on 02/20/2014 10/10/13   Lance Bosch, NP  nystatin (MYCOSTATIN) 100000 UNIT/ML suspension Take 5 mLs (500,000 Units total) by mouth 4 (four) times daily. 02/23/14   Robbie Lis, MD  predniSONE (DELTASONE) 5 MG tablet Take 10 tablets (50 mg total) by mouth daily with breakfast. 02/23/14   Robbie Lis, MD  zolpidem (AMBIEN) 5 MG tablet Take 1 tablet (5 mg total) by mouth at bedtime as needed for sleep. 02/23/14   Robbie Lis, MD   BP 159/104 mmHg  Pulse 125  Temp(Src) 98.1 F (36.7 C)  Resp 22  SpO2 97%  LMP 11/27/2012 Physical Exam  Constitutional: She is oriented to person, place, and time. She appears well-developed and well-nourished.  HENT:  Head: Normocephalic and atraumatic.  Right Ear: External ear normal.  Left Ear: External ear normal.  Nose: Nose normal.  Eyes: Right eye exhibits no discharge. Left eye exhibits no discharge.  Cardiovascular: Normal rate, regular rhythm and normal heart sounds.   Pulmonary/Chest: Tachypnea noted. She is in respiratory distress. She has decreased breath sounds. She has no wheezes.  Speaks only a few words at a time due to dyspnea  Abdominal: Soft. There is no tenderness.  Neurological: She is alert and oriented to person, place, and time.  Skin: Skin is warm and dry.  Nursing note and vitals reviewed.   ED Course  Procedures (including critical care time) Labs Review Labs Reviewed  CBC - Abnormal; Notable for the following:    WBC 19.7 (*)    Platelets 421 (*)    All other components within normal limits  BASIC METABOLIC PANEL - Abnormal; Notable for the following:    Glucose, Bld 138 (*)    GFR calc non Af Amer 88 (*)    All other components within normal limits  GLUCOSE, CAPILLARY - Abnormal; Notable for the following:    Glucose-Capillary 139 (*)    All other components within normal limits  I-STAT ARTERIAL BLOOD GAS, ED - Abnormal; Notable for the following:    Bicarbonate 25.4  (*)    All other components within normal limits  CBG MONITORING, ED - Abnormal; Notable for the following:    Glucose-Capillary 154 (*)    All other components within normal limits  CULTURE, BLOOD (ROUTINE X 2)  CULTURE, BLOOD (ROUTINE X 2)  CULTURE, EXPECTORATED SPUTUM-ASSESSMENT  GRAM STAIN  RESPIRATORY VIRUS PANEL  D-DIMER, QUANTITATIVE  STREP PNEUMONIAE URINARY ANTIGEN  TROPONIN I  HIV ANTIBODY (ROUTINE TESTING)  LEGIONELLA ANTIGEN, URINE  COMPREHENSIVE METABOLIC PANEL  CBC WITH DIFFERENTIAL  TROPONIN I  TROPONIN I  I-STAT TROPOININ, ED    Imaging Review Dg  Chest Port 1 View  02/28/2014   CLINICAL DATA:  Respiratory distress. Shortness of breath with cough and chest congestion.  EXAM: PORTABLE CHEST - 1 VIEW  COMPARISON:  02/20/2014  FINDINGS: Heart size and pulmonary vascularity are normal and the lungs are clear. No osseous abnormality.  IMPRESSION: Normal chest.   Electronically Signed   By: Rozetta Nunnery M.D.   On: 02/28/2014 17:21     EKG Interpretation   Date/Time:  Tuesday February 28 2014 16:24:32 EST Ventricular Rate:  119 PR Interval:  132 QRS Duration: 72 QT Interval:  320 QTC Calculation: 450 R Axis:   -11 Text Interpretation:  Sinus tachycardia Inferior infarct , age  undetermined Cannot rule out Anterior infarct , age undetermined Abnormal  ECG no significant change since Feb 20 2014 Confirmed by Regenia Skeeter  MD,  Herta Hink [phone]) on 02/28/2014 4:30:35 PM      MDM   Final diagnoses:  SOB (shortness of breath)  Anxiety      Patient has a history of asthma but I believe that anxiety is playing a large role in her dyspnea. There is no significant wheezing, but she did seem to improve somewhat with albuterol. She was also given Ativan with some improvement. She still appears short of breath and is tachycardic, and this will need admission for observation and respiratory support. She is not worsened, does not seem to be tiring out, and is not hypoxic. She had a  workup for pulmonary embolism last week, do not feel she needs repeat CT scanning. At this point she will need admission to step down with the hospitalist.    Ephraim Hamburger, MD 03/01/14 [phone]

## 2014-02-28 NOTE — ED Notes (Signed)
Pt to ED c/o increased shortness of breath since yesterday. Was seen on Thursday for same. Pt reports this time is worse. Pt with hx of asthma. Shortness of breath unrelieved with inhaler and multiple treatments at home. Also c/o chest tightness and productive cough

## 2014-02-28 NOTE — H&P (Signed)
[facility] History and Physical  Marisa [name] WGY:[id number] DOB: 22-Mar-1975 DOA: 02/28/2014  Referring physician: ED physician PCP: Angelica Chessman, MD  Specialists:   Chief Complaint: SOB and chest pain  HPI: Marisa [name] is a 39 y.o. female with past medical history of asthma, anxiety disorder, diabetes mellitus, remote history of cardiac arrest during the child delivery, who presents with shortness of breath and chest pain.  Patient was recently hospitalized from 1/4 to 02/23/14 due to asthma exacerbation. She was discharged in stable condition on Levaquin, tapering dose of prednisone, albuterol and DuoNeb nebulizers. She has been compliant to her medications. At about 4:30 PM, patient started having worsening shortness of breath. She also has cough without significant sputum production. No fever or chills. She reporte having chest pain. The chest pain is located in the left and left lower chest radiating to her left side of her back. The chest pain is pleuritic, and seems to be related to coughing. Patient used 6 times of inhalers and nebulizers at home without improvement. She does not have pain over the calf areas. Patient denies fever, chills, headaches, abdominal pain, diarrhea, dysuria, urgency, frequency, hematuria, skin rashes or leg swelling.  Work up in the ED demonstrates  negative chest x-ray for acute abnormalities. The negative troponin. Leukocytosis with WBC 19.7 (patient is on prednisone). Arterial ABG showed respiratory alkalosis (pH 7.425, PCO2 38.7, PO2 85, bicarbonate 25.4). Patient is admitted to inpatient for further evaluation and treatment.  Review of Systems: As presented in the history of presenting illness, rest negative.  Where does patient live?  At home Can patient participate in ADLs? Yes  Allergy:  Allergies  Allergen Reactions  . Penicillins Anaphylaxis  . Shellfish Allergy Anaphylaxis  . Other     Mushroom-- swelling throat,  eyes Animals with fur -- swelling throat, eyes    Past Medical History  Diagnosis Date  . Asthma   . EP (ectopic pregnancy)   . Ovarian tumor   . Tumor, thyroid   . Sleep apnea   . Chronic headaches   . Irregular heartbeat   . Chronic female pelvic pain 10/11/2012  . Abnormal uterine bleeding (AUB) 10/11/2012  . Cardiac arrest 1992    during delivery  . Shortness of breath   . Anemia   . Cancer     colon CA  . Chronic diarrhea   . Gait instability   . Frequent falls   . Anxiety   . Panic attack   . IBS (irritable bowel syndrome)   . Type II or unspecified type diabetes mellitus without mention of complication, not stated as uncontrolled 10/13/2013    Past Surgical History  Procedure Laterality Date  . Cholecystectomy  2005  . Unilateral salpingectomy  2009    Abdominal left salpingectomy  . Vaginal hysterectomy N/A 01/06/2013    Procedure: HYSTERECTOMY VAGINAL;  Surgeon: Osborne Oman, MD;  Location: [facility];  Service: Gynecology;  Laterality: N/A;  . Colonoscopy Left 04/29/2013    Procedure: COLONOSCOPY;  Surgeon: Arta Silence, MD;  Location: [facility];  Service: Endoscopy;  Laterality: Left;  . Flexible sigmoidoscopy N/A 11/09/2013    Procedure: FLEXIBLE SIGMOIDOSCOPY;  Surgeon: Arta Silence, MD;  Location: [facility];  Service: Endoscopy;  Laterality: N/A;    Social History:  reports that she quit smoking about 3 weeks ago. Her smoking use included Cigarettes. She has a 2.75 pack-year smoking history. She has never used smokeless tobacco. She reports that she drinks about 4.8 oz  of alcohol per week. She reports that she does not use illicit drugs.  Family History:  Family History  Problem Relation Age of Onset  . Hypertension Mother   . Diabetes Mother   . Cancer Father   . Hyperlipidemia Father   . Hypertension Father   . Allergies Mother   . Heart disease Mother   . Heart disease Maternal Grandmother      Prior to Admission medications    Medication Sig Start Date End Date Taking? Authorizing Provider  albuterol (PROVENTIL HFA;VENTOLIN HFA) 108 (90 BASE) MCG/ACT inhaler Inhale 1-2 puffs into the lungs every 6 (six) hours as needed for wheezing or shortness of breath. 02/23/14  Yes Robbie Lis, MD  glucose blood test strip Use as instructed 10/13/13  Yes Lance Bosch, NP  glucose monitoring kit (FREESTYLE) monitoring kit 1 each by Does not apply route as needed for other. 10/13/13  Yes Lance Bosch, NP  ipratropium-albuterol (DUONEB) 0.5-2.5 (3) MG/3ML SOLN Take 3 mLs by nebulization every 6 (six) hours as needed. 02/23/14  Yes Robbie Lis, MD  Lancets (FREESTYLE) lancets Use as instructed 10/13/13  Yes Lance Bosch, NP  levofloxacin (LEVAQUIN) 750 MG tablet Take 1 tablet (750 mg total) by mouth daily. 02/23/14  Yes Robbie Lis, MD  metFORMIN (GLUCOPHAGE) 500 MG tablet Take 1 tablet (500 mg total) by mouth 2 (two) times daily with a meal. 10/10/13  Yes Lance Bosch, NP  nystatin (MYCOSTATIN) 100000 UNIT/ML suspension Take 5 mLs (500,000 Units total) by mouth 4 (four) times daily. 02/23/14  Yes Robbie Lis, MD  predniSONE (DELTASONE) 5 MG tablet Take 10 tablets (50 mg total) by mouth daily with breakfast. 02/23/14  Yes Robbie Lis, MD  zolpidem (AMBIEN) 5 MG tablet Take 1 tablet (5 mg total) by mouth at bedtime as needed for sleep. 02/23/14  Yes Robbie Lis, MD    Physical Exam: Filed Vitals:   02/28/14 1815 02/28/14 1830 02/28/14 1845 02/28/14 1900  BP: 118/80 120/81 133/72 114/73  Pulse: 125 122 120 120  Temp:      Resp: '18 24 26 29  ' SpO2: 97% 98% 99% 100%   General: Not in acute distress. Patient looks very anxious.  HEENT:       Eyes: PERRL, EOMI, no scleral icterus       ENT: No discharge from the ears and nose, no pharynx injection, no tonsillar enlargement.        Neck: No JVD, no bruit, no mass felt. Cardiac: S1/S2, RRR, No murmurs, No gallops or rubs Pulm: slightly decreased air movement bilaterally, no  wheezing, but has mild rhonchi, No rale or rubs. Abd: Soft, nondistended, nontender, no rebound pain, no organomegaly, BS present Ext: No edema bilaterally. 2+DP/PT pulse bilaterally Musculoskeletal: No joint deformities, erythema, or stiffness, ROM full Skin: No rashes.  Neuro: Alert and oriented X3, cranial nerves II-XII grossly intact, muscle strength 5/5 in all extremeties, sensation to light touch intact.  Psych: Patient is not psychotic, no suicidal or hemocidal ideation.  Labs on Admission:  Basic Metabolic Panel:  Recent Labs Lab 02/28/14 1630  NA 137  K 4.2  CL 100  CO2 23  GLUCOSE 138*  BUN 13  CREATININE 0.83  CALCIUM 9.9   Liver Function Tests: No results for input(s): AST, ALT, ALKPHOS, BILITOT, PROT, ALBUMIN in the last 168 hours. No results for input(s): LIPASE, AMYLASE in the last 168 hours. No results for input(s): AMMONIA in the  last 168 hours. CBC:  Recent Labs Lab 02/28/14 1630  WBC 19.7*  HGB 13.9  HCT 40.4  MCV 91.8  PLT 421*   Cardiac Enzymes: No results for input(s): CKTOTAL, CKMB, CKMBINDEX, TROPONINI in the last 168 hours.  BNP (last 3 results) No results for input(s): PROBNP in the last 8760 hours. CBG:  Recent Labs Lab 02/22/14 2100 02/23/14 0007 02/23/14 0447 02/23/14 0718 02/23/14 1134  GLUCAP 255* 233* 197* 137* 224*    Radiological Exams on Admission: Dg Chest Port 1 View  02/28/2014   CLINICAL DATA:  Respiratory distress. Shortness of breath with cough and chest congestion.  EXAM: PORTABLE CHEST - 1 VIEW  COMPARISON:  02/20/2014  FINDINGS: Heart size and pulmonary vascularity are normal and the lungs are clear. No osseous abnormality.  IMPRESSION: Normal chest.   Electronically Signed   By: Rozetta Nunnery M.D.   On: 02/28/2014 17:21    EKG: Independently reviewed.   Assessment/Plan Principal Problem:   Asthma exacerbation Active Problems:   Current smoker   Atypical chest pain   Diabetes mellitus without complication    Anxiety   History of cardiac arrest  Asthma exacerbation:  Patient received 1 dose of Solu-Medrol and Ativan, and magnesium in emergency room. When I evaluated the patient in ED., she does not have wheezing. She only has slightly decreased air movement and mild rhonchi. Patient seems to have overused breathing treatment given her respiratory alkalosis on ABG. Another potential differential diagnosis is PE given her pleuritic chest pain, though she does not have significant risk factors.  - Admit to inpt and monitor on tele - Check respiratory virus panel, urine legionella and strep antigen - hold breathing treatment now given respiratory alkalosis. - will give DuoNeb nebs q4h prn and q6h PRN xopenex nebulizers when patient has wheezing - IV solumedrol 36m q8 hours.  - Continuous pulse ox, with O2 supplementation if O2 sat < 92%.  - Mucinex for cough - will not start antibiotics since patient was just treated with antibiotics, and in no signs of infection currently. - Check d-dimer to rule out pulmonary embolism. If positive, will get CTA  Atypical chest pain: Likely related to the coughing. But needs to r/o PE -Troponin 3 -Follow-up d-dimer  Diabetes mellitus: A1c was 03/02/14 it patient is on metformin at home. -Switch to sliding scale insulin.  Anxiety: -Ativan When necessary  DVT ppx: SQ Heparin    Code Status: Full code Family Communication:   Yes, patient's   husband    at bed side Disposition Plan: Admit to inpatient   Date of Service 02/28/2014    NIvor Costa[facility] Pager [phone] If 7PM-7AM, please contact night-coverage [email or web address] Password [facility]1/01/2015, 7:59 PM

## 2014-02-28 NOTE — ED Notes (Signed)
Dr. Goldston at bedside.  

## 2014-02-28 NOTE — ED Notes (Addendum)
Dr.Nu Hospitalist at bedside

## 2014-02-28 NOTE — ED Notes (Signed)
RT at bedside.

## 2014-03-01 ENCOUNTER — Encounter (HOSPITAL_COMMUNITY): Payer: Self-pay | Admitting: General Practice

## 2014-03-01 DIAGNOSIS — J45901 Unspecified asthma with (acute) exacerbation: Principal | ICD-10-CM

## 2014-03-01 DIAGNOSIS — R0789 Other chest pain: Secondary | ICD-10-CM

## 2014-03-01 DIAGNOSIS — Z720 Tobacco use: Secondary | ICD-10-CM

## 2014-03-01 LAB — CBC WITH DIFFERENTIAL/PLATELET
Basophils Absolute: 0 10*3/uL (ref 0.0–0.1)
Basophils Relative: 0 % (ref 0–1)
Eosinophils Absolute: 0 10*3/uL (ref 0.0–0.7)
Eosinophils Relative: 0 % (ref 0–5)
HCT: 38.1 % (ref 36.0–46.0)
Hemoglobin: 12.9 g/dL (ref 12.0–15.0)
Lymphocytes Relative: 8 % — ABNORMAL LOW (ref 12–46)
Lymphs Abs: 1.9 10*3/uL (ref 0.7–4.0)
MCH: 31.2 pg (ref 26.0–34.0)
MCHC: 33.9 g/dL (ref 30.0–36.0)
MCV: 92.3 fL (ref 78.0–100.0)
Monocytes Absolute: 0.4 10*3/uL (ref 0.1–1.0)
Monocytes Relative: 2 % — ABNORMAL LOW (ref 3–12)
Neutro Abs: 22.4 10*3/uL — ABNORMAL HIGH (ref 1.7–7.7)
Neutrophils Relative %: 90 % — ABNORMAL HIGH (ref 43–77)
Platelets: 418 10*3/uL — ABNORMAL HIGH (ref 150–400)
RBC: 4.13 MIL/uL (ref 3.87–5.11)
RDW: 13.5 % (ref 11.5–15.5)
WBC: 24.7 10*3/uL — ABNORMAL HIGH (ref 4.0–10.5)

## 2014-03-01 LAB — GLUCOSE, CAPILLARY
Glucose-Capillary: 173 mg/dL — ABNORMAL HIGH (ref 70–99)
Glucose-Capillary: 131 mg/dL — ABNORMAL HIGH (ref 70–99)
Glucose-Capillary: 174 mg/dL — ABNORMAL HIGH (ref 70–99)
Glucose-Capillary: 211 mg/dL — ABNORMAL HIGH (ref 70–99)

## 2014-03-01 LAB — INFLUENZA PANEL BY PCR (TYPE A & B)
H1N1 flu by pcr: NOT DETECTED
Influenza A By PCR: NEGATIVE
Influenza B By PCR: NEGATIVE

## 2014-03-01 LAB — COMPREHENSIVE METABOLIC PANEL
ALT: 27 U/L (ref 0–35)
AST: 27 U/L (ref 0–37)
Albumin: 3 g/dL — ABNORMAL LOW (ref 3.5–5.2)
Alkaline Phosphatase: 52 U/L (ref 39–117)
Anion gap: 10 (ref 5–15)
BUN: 12 mg/dL (ref 6–23)
CO2: 24 mmol/L (ref 19–32)
Calcium: 8.3 mg/dL — ABNORMAL LOW (ref 8.4–10.5)
Chloride: 98 mEq/L (ref 96–112)
Creatinine, Ser: 0.73 mg/dL (ref 0.50–1.10)
GFR calc Af Amer: >90 mL/min (ref >90)
GFR calc non Af Amer: >90 mL/min (ref >90)
Glucose, Bld: 166 mg/dL — ABNORMAL HIGH (ref 70–99)
Potassium: 4.4 mmol/L (ref 3.5–5.1)
Sodium: 132 mmol/L — ABNORMAL LOW (ref 135–145)
Total Bilirubin: 0.4 mg/dL (ref 0.3–1.2)
Total Protein: 6.1 g/dL (ref 6.0–8.3)

## 2014-03-01 LAB — TROPONIN I
Troponin I: <0.3 ng/mL — ABNORMAL HIGH (ref <0.031)
Troponin I: <0.03 ng/mL (ref <0.031)

## 2014-03-01 MED ORDER — INFLUENZA VAC SPLIT QUAD 0.5 ML IM SUSY
0.5000 mL | PREFILLED_SYRINGE | INTRAMUSCULAR | Status: AC
  Administered 2014-03-02: 0.5 mL via INTRAMUSCULAR
  Filled 2014-03-01: qty 0.5

## 2014-03-01 MED ORDER — IBUPROFEN 800 MG PO TABS
800.0000 mg | ORAL_TABLET | Freq: Three times a day (TID) | ORAL | Status: DC | PRN
  Administered 2014-03-01 – 2014-03-04 (×3): 800 mg via ORAL
  Filled 2014-03-01 (×5): qty 1

## 2014-03-01 MED ORDER — VALPROATE SODIUM 500 MG/5ML IV SOLN
500.0000 mg | Freq: Once | INTRAVENOUS | Status: AC
  Administered 2014-03-01: 500 mg via INTRAVENOUS
  Filled 2014-03-01: qty 5

## 2014-03-01 MED ORDER — METHOCARBAMOL 500 MG PO TABS
500.0000 mg | ORAL_TABLET | Freq: Three times a day (TID) | ORAL | Status: AC
  Administered 2014-03-01 – 2014-03-02 (×3): 500 mg via ORAL
  Filled 2014-03-01 (×3): qty 1

## 2014-03-01 MED ORDER — CALCIUM CARBONATE ANTACID 500 MG PO CHEW
3.0000 | CHEWABLE_TABLET | Freq: Once | ORAL | Status: AC
  Administered 2014-03-01: 600 mg via ORAL
  Filled 2014-03-01: qty 3

## 2014-03-01 MED ORDER — IPRATROPIUM-ALBUTEROL 0.5-2.5 (3) MG/3ML IN SOLN
3.0000 mL | Freq: Four times a day (QID) | RESPIRATORY_TRACT | Status: DC
  Administered 2014-03-01 – 2014-03-04 (×11): 3 mL via RESPIRATORY_TRACT
  Filled 2014-03-01 (×13): qty 3

## 2014-03-01 MED ORDER — LEVALBUTEROL HCL 0.63 MG/3ML IN NEBU: 0.6300 mg | INHALATION_SOLUTION | RESPIRATORY_TRACT | Status: DC | PRN

## 2014-03-01 MED ORDER — LORAZEPAM 2 MG/ML IJ SOLN
0.5000 mg | INTRAMUSCULAR | Status: DC | PRN
  Administered 2014-03-01: 0.5 mg via INTRAVENOUS
  Filled 2014-03-01: qty 1

## 2014-03-01 MED ORDER — HYDROMORPHONE HCL 1 MG/ML IJ SOLN
1.0000 mg | INTRAMUSCULAR | Status: DC | PRN
  Administered 2014-03-01 – 2014-03-04 (×12): 1 mg via INTRAVENOUS
  Filled 2014-03-01 (×12): qty 1

## 2014-03-01 NOTE — Consult Note (Signed)
Name: Marisa [name] MRN: [id number] DOB: [birth year]    ADMISSION DATE:  02/28/2014 CONSULTATION DATE:  03/01/2014  REFERRING MD :  Algis Liming  CHIEF COMPLAINT:  Asthma Exacerbation  BRIEF PATIENT DESCRIPTION: 39 y.o. F with hx of Asthma brought to [facility] ED 01/12 for SOB and chest tightness felt to be due to asthma exacerbation.  She was recently hospitalized for same, just discharged 5 days prior.  PCCM consulted for recs.  SIGNIFICANT EVENTS  01/04 through 01/07 - admitted for asthma exacerbation 01/12 - readmitted for same 01/13 - PCCM consulted  STUDIES:  CXR 01/12 >>> neg   HISTORY OF PRESENT ILLNESS:  Marisa [name] is a 39 y.o. F with PMH as outlined below.  She was brought to [facility] ED on evening of 01/12 for SOB and chest tightness.  She has hx of prior asthma exacerbations and felt that this presentation was similar.  In fact, she had recent hospitalization 01/04 - 01/07 for the same.  She was discharged on Levaquin, tapered prednisone, albuterol and DuoNebs nebs.  She reports that she was doing fine after discharge but late afternoon on day of presentation, symptoms recurred prompting her to come to ED for further evaluation.  She denies fevers/chills/sweats, abd pain, N/V/D.  She has had cough that is occasionally productive, sometimes yellowish sputum, other times dry. She does endorse chest pain / tightness.  Pain located in left lower chest and radiating to left side and back.  She feels that pain is most prominent with coughing or deep breathing. Once symptoms began, she tried to use her inhaler 6 times without relief.  She does report that she has anxiety and has been more anxious than usual. She was admitted by [facility] and PCCM was consulted the following morning for further recommendations. She does report that she has required intubation once before for asthma exacerbation.  She did have PFT's in 2014 that did not show obstructive pattern.   Initial CXR clear.  Troponins  negative x 3, EKG reassuring. D-dimer negative.  PAST MEDICAL HISTORY :   has a past medical history of Asthma; EP (ectopic pregnancy); Ovarian tumor; Tumor, thyroid; Sleep apnea; Chronic headaches; Irregular heartbeat; Chronic female pelvic pain (10/11/2012); Abnormal uterine bleeding (AUB) (10/11/2012); Cardiac arrest (1992); Shortness of breath; Anemia; Cancer; Chronic diarrhea; Gait instability; Frequent falls; Anxiety; Panic attack; IBS (irritable bowel syndrome); and Type II or unspecified type diabetes mellitus without mention of complication, not stated as uncontrolled (10/13/2013).  has past surgical history that includes Cholecystectomy (2005); Unilateral salpingectomy (2009); Vaginal hysterectomy (N/A, 01/06/2013); Colonoscopy (Left, 04/29/2013); Flexible sigmoidoscopy (N/A, 11/09/2013); and Abdominal hysterectomy. Prior to Admission medications   Medication Sig Start Date End Date Taking? Authorizing Provider  albuterol (PROVENTIL HFA;VENTOLIN HFA) 108 (90 BASE) MCG/ACT inhaler Inhale 1-2 puffs into the lungs every 6 (six) hours as needed for wheezing or shortness of breath. 02/23/14  Yes Robbie Lis, MD  glucose blood test strip Use as instructed 10/13/13  Yes Lance Bosch, NP  glucose monitoring kit (FREESTYLE) monitoring kit 1 each by Does not apply route as needed for other. 10/13/13  Yes Lance Bosch, NP  ipratropium-albuterol (DUONEB) 0.5-2.5 (3) MG/3ML SOLN Take 3 mLs by nebulization every 6 (six) hours as needed. 02/23/14  Yes Robbie Lis, MD  Lancets (FREESTYLE) lancets Use as instructed 10/13/13  Yes Lance Bosch, NP  levofloxacin (LEVAQUIN) 750 MG tablet Take 1 tablet (750 mg total) by mouth daily. 02/23/14  Yes Robbie Lis, MD  metFORMIN (GLUCOPHAGE) 500 MG tablet Take 1 tablet (500 mg total) by mouth 2 (two) times daily with a meal. 10/10/13  Yes Lance Bosch, NP  nystatin (MYCOSTATIN) 100000 UNIT/ML suspension Take 5 mLs (500,000 Units total) by mouth 4 (four) times daily.  02/23/14  Yes Robbie Lis, MD  predniSONE (DELTASONE) 5 MG tablet Take 10 tablets (50 mg total) by mouth daily with breakfast. 02/23/14  Yes Robbie Lis, MD  zolpidem (AMBIEN) 5 MG tablet Take 1 tablet (5 mg total) by mouth at bedtime as needed for sleep. 02/23/14  Yes Robbie Lis, MD   Allergies  Allergen Reactions  . Penicillins Anaphylaxis  . Shellfish Allergy Anaphylaxis  . Other     Mushroom-- swelling throat, eyes Animals with fur -- swelling throat, eyes    FAMILY HISTORY:  family history includes Allergies in her mother; Cancer in her father; Diabetes in her mother; Heart disease in her maternal grandmother and mother; Hyperlipidemia in her father; Hypertension in her father and mother. SOCIAL HISTORY:  reports that she quit smoking 4 days ago. Her smoking use included Cigarettes. She has a 2.75 pack-year smoking history. She has never used smokeless tobacco. She reports that she drinks about 4.8 oz of alcohol per week. She reports that she does not use illicit drugs.  REVIEW OF SYSTEMS:   All negative; except for those that are bolded, which indicate positives.  Constitutional: weight loss, weight gain, night sweats, fevers, chills, fatigue, weakness.  HEENT: headaches, sore throat, sneezing, nasal congestion, post nasal drip, difficulty swallowing, tooth/dental problems, visual complaints, visual changes, ear aches. Neuro: difficulty with speech, weakness, numbness, ataxia, anxiety. CV:  chest pain, orthopnea, PND, swelling in lower extremities, dizziness, palpitations, syncope.  Resp: cough, hemoptysis, dyspnea, wheezing. GI  heartburn, indigestion, abdominal pain, nausea, vomiting, diarrhea, constipation, change in bowel habits, loss of appetite, hematemesis, melena, hematochezia.  GU: dysuria, change in color of urine, urgency or frequency, flank pain, hematuria. MSK: joint pain or swelling, decreased range of motion. Psych: change in mood or affect, depression, anxiety,  suicidal ideations, homicidal ideations. Skin: rash, itching, bruising.    SUBJECTIVE:   VITAL SIGNS: Temp:  [97.3 F (36.3 C)-98.1 F (36.7 C)] 97.3 F (36.3 C) (01/13 0620) Pulse Rate:  [97-125] 97 (01/13 0620) Resp:  [17-30] 22 (01/13 0620) BP: (101-159)/(63-104) 101/63 mmHg (01/13 0620) SpO2:  [95 %-100 %] 95 % (01/13 0620) FiO2 (%):  [28 %] 28 % (01/12 2133) Weight:  [111 kg (244 lb 11.4 oz)-112.6 kg (248 lb 3.8 oz)] 111 kg (244 lb 11.4 oz) (01/13 0620)  PHYSICAL EXAMINATION: General: Obese female, resting in bed watching TV, in NAD, anxious. Neuro: A&O x 3, non-focal.  HEENT: [place]/AT. PERRL, sclerae anicteric. Cardiovascular: RRR, no M/R/G.  Lungs: Respirations shallow and unlabored.  Faint scattered wheezes bilaterally. Abdomen: BS x 4, soft, NT/ND.  Musculoskeletal: No gross deformities, no edema.  Skin: Intact, warm, no rashes.   Recent Labs Lab 02/28/14 1630 03/01/14 0424  NA 137 132*  K 4.2 4.4  CL 100 98  CO2 23 24  BUN 13 12  CREATININE 0.83 0.73  GLUCOSE 138* 166*    Recent Labs Lab 02/28/14 1630 03/01/14 0424  HGB 13.9 12.9  HCT 40.4 38.1  WBC 19.7* 24.7*  PLT 421* 418*   Dg Chest Port 1 View  02/28/2014   CLINICAL DATA:  Respiratory distress. Shortness of breath with cough and chest congestion.  EXAM: PORTABLE CHEST - 1 VIEW  COMPARISON:  02/20/2014  FINDINGS: Heart size and pulmonary vascularity are normal and the lungs are clear. No osseous abnormality.  IMPRESSION: Normal chest.   Electronically Signed   By: Rozetta Nunnery M.D.   On: 02/28/2014 17:21    ASSESSMENT / PLAN:  Acute hypoxic respiratory failure - Although some of this could be explained by pts underlying asthma, I query whether anxiety is also playing a role here. Asthma exacerbation  Recs: Continue supplemental O2 to maintain SpO2 > 92%. Albuterol frequency adjusted. Lorazepam dose adjusted. Continue DuoNebs, steroids, mucinex. Flutter valve. If continues to have  productive cough, would consider repeating CXR to evaluate possibility of evolving infiltrate. Repeat PFT's once over this acute illness.  Montey Hora, [facility] Pgr: [phone]  or [phone] 03/01/2014, 10:31 AM  Continue O2 as needed, anti-anxiety medications adjusted, PFTs noted (patient sees Dr. Gwenette Greet in the office), no significant obstructive patter.  Will need repeat PFTs as outpatient and f/u with Dr. Gwenette Greet.  Airway clearance measures ordered.  PCCM will f/u.  Patient seen and examined, agree with above note.  I dictated the care and orders written for this patient under my direction.  Rush Farmer, MD [phone]

## 2014-03-01 NOTE — Progress Notes (Signed)
PROGRESS NOTE    Marisa [name] ERD:[id number] DOB: [birth year] DOA: 02/28/2014 PCP: Angelica Chessman, MD  Primary Pulmonologist: Dr. Danton Sewer  HPI/Brief narrative 39 y.o. female with past medical history of asthma (since age 34 years), ongoing tobacco abuse, anxiety disorder, diabetes mellitus, remote history of cardiac arrest during the child delivery, who presented with shortness of breath and chest pain felt to be due to asthma exacerbation. She was recently hospitalized for same and discharged 5 days prior. Work up in the ED demonstrates negative chest x-ray for acute abnormalities. The negative troponin. Leukocytosis with WBC 19.7 (patient is on prednisone). Arterial ABG showed respiratory alkalosis (pH 7.425, PCO2 38.7, PO2 85, bicarbonate 25.4). Patient is admitted to inpatient for further evaluation and treatment.    Assessment/Plan:  1. Acute asthma exacerbation: This is her second admission within a week for same presentation. States that she has been an asthmatic since age 56 years. Volunteers to using rescue inhalers anywhere between 7-13 times daily even on her "good days". Continues to smoke. Given poorly controlled baseline asthma and current recurrent exacerbation, consulted pulmonology whose input appreciated. Continue oxygen, bronchodilator nebulization adjusted, when necessary Ativan (anxiety felt to be playing a role), steroids, Mucinex and flutter valve. Recently completed a course of antibiotics. If she continues to have productive cough, consider repeating chest x-ray to rule out pneumonia. 2. Anxiety: When necessary Ativan. 3. Tobacco abuse: Cessation counseled 4. Atypical chest pain: Related to problem #1. D-dimer negative. Recent CTA chest without PE. Currently complains of chest tightness and no chest pain. 5. Uncontrolled type II DM: Continue NovoLog SSI while on steroids. 6. Morbid obesity   Code Status: Full Family Communication: None at  bedside Disposition Plan: Home when medically stable   Consultants:  Pulmonology  Procedures:  None  Antibiotics:  None   Subjective: Feels slightly better. Still has some nonproductive cough, chest tightness. Dyspnea has slightly improved compared to admission.  Objective: Filed Vitals:   02/28/14 2133 02/28/14 2141 03/01/14 0200 03/01/14 0620  BP:  111/80 109/73 101/63  Pulse:  109 110 97  Temp:  97.7 F (36.5 C) 97.8 F (36.6 C) 97.3 F (36.3 C)  TempSrc:   Oral Oral  Resp:  24 20 22   Height:  5\' 5"  (1.651 m)    Weight:  112.6 kg (248 lb 3.8 oz)  111 kg (244 lb 11.4 oz)  SpO2: 98% 99% 99% 95%    Intake/Output Summary (Last 24 hours) at 03/01/14 1418 Last data filed at 03/01/14 1137  Gross per 24 hour  Intake 1673.75 ml  Output   1450 ml  Net 223.75 ml   Filed Weights   02/28/14 2141 03/01/14 0620  Weight: 112.6 kg (248 lb 3.8 oz) 111 kg (244 lb 11.4 oz)     Exam:  General exam: Pleasant young female sitting up comfortably in bed. Respiratory system: Diminished breath sounds bilaterally, especially posteriorly with scattered bilateral medium pitched expiratory rhonchi in the back and few basal crackles right >left. No increased work of breathing. Able to speak in full sentences. Cardiovascular system: S1 & S2 heard, RRR. No JVD, murmurs, gallops, clicks or pedal edema. Telemetry: Sinus rhythm 90-sinus tachycardia in the 100s. Gastrointestinal system: Abdomen is nondistended, soft and nontender. Normal bowel sounds heard. Central nervous system: Alert and oriented. No focal neurological deficits. Extremities: Symmetric 5 x 5 power.   Data Reviewed: Basic Metabolic Panel:  Recent Labs Lab 02/28/14 1630 03/01/14 0424  NA 137 132*  K 4.2 4.4  CL 100 98  CO2 23 24  GLUCOSE 138* 166*  BUN 13 12  CREATININE 0.83 0.73  CALCIUM 9.9 8.3*   Liver Function Tests:  Recent Labs Lab 03/01/14 0424  AST 27  ALT 27  ALKPHOS 52  BILITOT 0.4  PROT 6.1   ALBUMIN 3.0*   No results for input(s): LIPASE, AMYLASE in the last 168 hours. No results for input(s): AMMONIA in the last 168 hours. CBC:  Recent Labs Lab 02/28/14 1630 03/01/14 0424  WBC 19.7* 24.7*  NEUTROABS  --  22.4*  HGB 13.9 12.9  HCT 40.4 38.1  MCV 91.8 92.3  PLT 421* 418*   Cardiac Enzymes:  Recent Labs Lab 02/28/14 2220 03/01/14 0424 03/01/14 0926  TROPONINI <0.03 <0.30* <0.03   BNP (last 3 results) No results for input(s): PROBNP in the last 8760 hours. CBG:  Recent Labs Lab 02/23/14 1134 02/28/14 2104 02/28/14 2252 03/01/14 0627 03/01/14 1132  GLUCAP 224* 154* 139* 173* 131*    Recent Results (from the past 240 hour(s))  Rapid strep screen     Status: None   Collection Time: 02/20/14  9:40 PM  Result Value Ref Range Status   Streptococcus, Group A Screen (Direct) NEGATIVE NEGATIVE Final    Comment: (NOTE) A Rapid Antigen test may result negative if the antigen level in the sample is below the detection level of this test. The FDA has not cleared this test as a stand-alone test therefore the rapid antigen negative result has reflexed to a Group A Strep culture.   Culture, Group A Strep     Status: None   Collection Time: 02/20/14  9:40 PM  Result Value Ref Range Status   Specimen Description THROAT  Final   Special Requests NONE  Final   Culture   Final    STREPTOCOCCUS,BETA HEMOLYTIC NOT GROUP A Performed at [facility]    Report Status 02/22/2014 FINAL  Final         Studies: Dg Chest Port 1 View  02/28/2014   CLINICAL DATA:  Respiratory distress. Shortness of breath with cough and chest congestion.  EXAM: PORTABLE CHEST - 1 VIEW  COMPARISON:  02/20/2014  FINDINGS: Heart size and pulmonary vascularity are normal and the lungs are clear. No osseous abnormality.  IMPRESSION: Normal chest.   Electronically Signed   By: Rozetta Nunnery M.D.   On: 02/28/2014 17:21        Scheduled Meds: . dextromethorphan-guaiFENesin  1  tablet Oral BID  . heparin  5,000 Units Subcutaneous 3 times per day  . [START ON 03/02/2014] Influenza vac split quadrivalent PF  0.5 mL Intramuscular Tomorrow-1000  . methylPREDNISolone (SOLU-MEDROL) injection  80 mg Intravenous 3 times per day  . nystatin  5 mL Oral QID   Continuous Infusions: . sodium chloride 75 mL/hr at 03/01/14 1335    Principal Problem:   Asthma exacerbation Active Problems:   Current smoker   Atypical chest pain   Diabetes mellitus without complication   Anxiety   History of cardiac arrest    Time spent: 40 minutes.    Vernell Leep, MD, FACP, FHM. [facility] Pager [phone]  If 7PM-7AM, please contact night-coverage [email or web address] Password [email or web address] 03/01/2014, 2:18 PM    LOS: 1 day

## 2014-03-01 NOTE — Progress Notes (Signed)
Addendum  Patient states that she is having unrelenting headache despite when necessary Tylenol and ibuprofen. Gives history of migraines for which she takes when necessary Excedrin and Benadryl at home. Apparently has seen a neurologist a year ago and told to have migraine. Discussed with neurology who recommended: Depacon 500 MG IV 1 dose, Robaxin 500 MG 3 times a day 24 hours and when necessary IV Dilaudid for severe pain. Monitor closely.  Vernell Leep, MD, FACP, FHM. [facility] Pager [phone]  If 7PM-7AM, please contact night-coverage [email or web address] Password [facility] 03/01/2014, 5:23 PM

## 2014-03-01 NOTE — Progress Notes (Signed)
Patient complaining of a headache that is unrelieved by her PRN tylenol.  Patient states she has had 10/10 pain in her head since admission but that she did not mention it until now.  Dr. Algis Liming notified of pain and ineffectiveness of pain medication; new order received.  Will continue to monitor.

## 2014-03-01 NOTE — Progress Notes (Signed)
Patient complaining that her headache is still a 10/10 despite the ibuprofen that she received.  Patient states she gets migraines at times but is not on any prescription medications for them.  Dr. Algis Liming notified of ineffectiveness of ibuprofen and patient's history of migraines.  New orders received.  Will continue to monitor.

## 2014-03-02 ENCOUNTER — Inpatient Hospital Stay (HOSPITAL_COMMUNITY): Payer: 59

## 2014-03-02 LAB — BASIC METABOLIC PANEL
Anion gap: 15 (ref 5–15)
BUN: 16 mg/dL (ref 6–23)
CO2: 23 mmol/L (ref 19–32)
Calcium: 9.3 mg/dL (ref 8.4–10.5)
Chloride: 99 mEq/L (ref 96–112)
Creatinine, Ser: 0.83 mg/dL (ref 0.50–1.10)
GFR calc Af Amer: >90 mL/min (ref >90)
GFR calc non Af Amer: 88 mL/min — ABNORMAL LOW (ref >90)
Glucose, Bld: 169 mg/dL — ABNORMAL HIGH (ref 70–99)
Potassium: 4.7 mmol/L (ref 3.5–5.1)
Sodium: 137 mmol/L (ref 135–145)

## 2014-03-02 LAB — EXPECTORATED SPUTUM ASSESSMENT W REFEX TO RESP CULTURE

## 2014-03-02 LAB — EXPECTORATED SPUTUM ASSESSMENT W GRAM STAIN, RFLX TO RESP C

## 2014-03-02 LAB — LEGIONELLA ANTIGEN, URINE

## 2014-03-02 LAB — GLUCOSE, CAPILLARY
Glucose-Capillary: 174 mg/dL — ABNORMAL HIGH (ref 70–99)
Glucose-Capillary: 188 mg/dL — ABNORMAL HIGH (ref 70–99)
Glucose-Capillary: 145 mg/dL — ABNORMAL HIGH (ref 70–99)
Glucose-Capillary: 237 mg/dL — ABNORMAL HIGH (ref 70–99)

## 2014-03-02 LAB — HIV ANTIBODY (ROUTINE TESTING W REFLEX)
HIV 1/O/2 Abs-Index Value: <1 (ref <1.00)
HIV-1/HIV-2 Ab: NONREACTIVE

## 2014-03-02 LAB — CBC
HCT: 36.9 % (ref 36.0–46.0)
Hemoglobin: 12.3 g/dL (ref 12.0–15.0)
MCH: 31.9 pg (ref 26.0–34.0)
MCHC: 33.3 g/dL (ref 30.0–36.0)
MCV: 95.8 fL (ref 78.0–100.0)
Platelets: 395 10*3/uL (ref 150–400)
RBC: 3.85 MIL/uL — ABNORMAL LOW (ref 3.87–5.11)
RDW: 13.7 % (ref 11.5–15.5)
WBC: 25 10*3/uL — ABNORMAL HIGH (ref 4.0–10.5)

## 2014-03-02 MED ORDER — ALPRAZOLAM 0.25 MG PO TABS
0.2500 mg | ORAL_TABLET | Freq: Two times a day (BID) | ORAL | Status: DC
  Administered 2014-03-02 – 2014-03-04 (×5): 0.25 mg via ORAL
  Filled 2014-03-02 (×5): qty 1

## 2014-03-02 MED ORDER — PREDNISONE 20 MG PO TABS
40.0000 mg | ORAL_TABLET | Freq: Every day | ORAL | Status: DC
  Administered 2014-03-03 – 2014-03-04 (×2): 40 mg via ORAL
  Filled 2014-03-02 (×3): qty 2

## 2014-03-02 NOTE — Progress Notes (Signed)
PROGRESS NOTE    Marisa [name] XBL:[id number] DOB: 09-14-1975 DOA: 02/28/2014 PCP: Angelica Chessman, MD  Primary Pulmonologist: Dr. Danton Sewer  HPI/Brief narrative 39 y.o. 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 She was recently hospitalized for same and discharged 5 days prior. Work up in the ED demonstrates negative chest x-ray for acute abnormalities. The negative troponin. Leukocytosis with WBC 19.7 (patient is on prednisone). Arterial ABG showed respiratory alkalosis (pH 7.425, PCO2 38.7, PO2 85, bicarbonate 25.4). Patient is admitted to inpatient for further evaluation and treatment.    Assessment/Plan:  1. Acute asthma exacerbation: This is her second admission within a week for same presentation. States that she has been an asthmatic since age 2 years. Volunteers to using rescue inhalers anywhere between 7-13 times daily even on her "good days". Continues to smoke. Given poorly controlled baseline asthma and current recurrent exacerbation, consulted pulmonology whose input appreciated. Continue oxygen, bronchodilator nebulization adjusted, when necessary Ativan (anxiety felt to be playing a role), steroids, Mucinex and flutter valve. Recently completed a course of antibiotics. Repeat chest x-ray: Basilar atelectasis but no acute findings. As per pulmonary, worsening dyspnea probably due to? asthma +/- bronchitis, anxiety and possible VCD. Improving. 2. Anxiety: Switch to low-dose scheduled Xanax. 3. Tobacco abuse: Cessation counseled 4. Atypical chest pain: Related to problem #1. D-dimer negative. Recent CTA chest without PE. Currently complains of chest tightness and no chest pain. 5. Uncontrolled type II DM: Continue NovoLog SSI while on  steroids. 6. Morbid obesity 7. Migraine: Had an episode on 1/13. Resolved after a dose of IV Depacon 8. Leukocytosis: Likely secondary to steroids. No clinical focus of infection.   Code Status: Full Family Communication: None at bedside Disposition Plan: Home when medically stable   Consultants:  Pulmonology  Procedures:  None  Antibiotics:  None   Subjective: Headache resolved. States that dyspnea is better but episodically has feeling of choking and unable to read. Some productive cough. No reported chest pain.  Objective: Filed Vitals:   03/02/14 0521 03/02/14 0810 03/02/14 0857 03/02/14 1215  BP: 155/96 123/74    Pulse: 102 86    Temp: 97.3 F (36.3 C)     TempSrc: Oral     Resp: 20     Height:      Weight: 115.395 kg (254 lb 6.4 oz)     SpO2: 99%  96% 97%    Intake/Output Summary (Last 24 hours) at 03/02/14 1306 Last data filed at 03/02/14 0600  Gross per 24 hour  Intake   1420 ml  Output   1751 ml  Net   -331 ml   Filed Weights   02/28/14 2141 03/01/14 0620 03/02/14 0521  Weight: 112.6 kg (248 lb 3.8 oz) 111 kg (244 lb 11.4 oz) 115.395 kg (254 lb 6.4 oz)     Exam:  General exam: Pleasant young female sitting up comfortably in bed. Respiratory system: Improved/fair breath sounds bilaterally.  No wheezing, rhonchi or crackles. No increased work of breathing. Able to speak in full sentences. Cardiovascular system: S1 & S2 heard, RRR. No JVD, murmurs, gallops, clicks or pedal edema. Telemetry: Sinus rhythm 90-sinus tachycardia in the 100s. Gastrointestinal system: Abdomen is nondistended, soft and nontender. Normal bowel sounds heard. Central nervous system: Alert and oriented. No focal neurological  deficits. Extremities: Symmetric 5 x 5 power.   Data Reviewed: Basic Metabolic Panel:  Recent Labs Lab 02/28/14 1630 03/01/14 0424 03/02/14 0415  NA 137 132* 137  K 4.2 4.4 4.7  CL 100 98 99  CO2 23 24 23   GLUCOSE 138* 166* 169*  BUN 13 12 16     CREATININE 0.83 0.73 0.83  CALCIUM 9.9 8.3* 9.3   Liver Function Tests:  Recent Labs Lab 03/01/14 0424  AST 27  ALT 27  ALKPHOS 52  BILITOT 0.4  PROT 6.1  ALBUMIN 3.0*   No results for input(s): LIPASE, AMYLASE in the last 168 hours. No results for input(s): AMMONIA in the last 168 hours. CBC:  Recent Labs Lab 02/28/14 1630 03/01/14 0424 03/02/14 0415  WBC 19.7* 24.7* 25.0*  NEUTROABS  --  22.4*  --   HGB 13.9 12.9 12.3  HCT 40.4 38.1 36.9  MCV 91.8 92.3 95.8  PLT 421* 418* 395   Cardiac Enzymes:  Recent Labs Lab 02/28/14 2220 03/01/14 0424 03/01/14 0926  TROPONINI <0.03 <0.30* <0.03   BNP (last 3 results) No results for input(s): PROBNP in the last 8760 hours. CBG:  Recent Labs Lab 03/01/14 1132 03/01/14 1622 03/01/14 2106 03/02/14 0549 03/02/14 1113  GLUCAP 131* 174* 211* 174* 188*    Recent Results (from the past 240 hour(s))  Rapid strep screen     Status: None   Collection Time: 02/20/14  9:40 PM  Result Value Ref Range Status   Streptococcus, Group A Screen (Direct) NEGATIVE NEGATIVE Final    Comment: (NOTE) A Rapid Antigen test may result negative if the antigen level in the sample is below the detection level of this test. The FDA has not cleared this test as a stand-alone test therefore the rapid antigen negative result has reflexed to a Group A Strep culture.   Culture, Group A Strep     Status: None   Collection Time: 02/20/14  9:40 PM  Result Value Ref Range Status   Specimen Description THROAT  Final   Special Requests NONE  Final   Culture   Final    STREPTOCOCCUS,BETA HEMOLYTIC NOT GROUP A Performed at [facility]    Report Status 02/22/2014 FINAL  Final  Culture, blood (routine x 2) Call MD if unable to obtain prior to antibiotics being given     Status: None (Preliminary result)   Collection Time: 02/28/14 10:20 PM  Result Value Ref Range Status   Specimen Description BLOOD LEFT ARM  Final   Special Requests  BOTTLES DRAWN AEROBIC AND ANAEROBIC 10CC EACH  Final   Culture   Final           BLOOD CULTURE RECEIVED NO GROWTH TO DATE CULTURE WILL BE HELD FOR 5 DAYS BEFORE ISSUING A FINAL NEGATIVE REPORT Performed at [facility]    Report Status PENDING  Incomplete  Culture, blood (routine x 2) Call MD if unable to obtain prior to antibiotics being given     Status: None (Preliminary result)   Collection Time: 02/28/14 10:29 PM  Result Value Ref Range Status   Specimen Description BLOOD RIGHT HAND  Final   Special Requests BOTTLES DRAWN AEROBIC ONLY 4CC  Final   Culture   Final           BLOOD CULTURE RECEIVED NO GROWTH TO DATE CULTURE WILL BE HELD FOR 5 DAYS BEFORE ISSUING A FINAL NEGATIVE REPORT Performed at [facility]    Report Status PENDING  Incomplete  Culture, sputum-assessment     Status: None   Collection Time: 03/01/14  6:43 PM  Result Value Ref Range Status   Specimen Description SPUTUM  Final   Special Requests NONE  Final   Sputum evaluation   Final    MICROSCOPIC FINDINGS SUGGEST THAT THIS SPECIMEN IS NOT REPRESENTATIVE OF LOWER RESPIRATORY SECRETIONS. PLEASE RECOLLECT. CALLED TO G.RASUL, RN AT 2145 BY L.PITT 03/01/14    Report Status 03/02/2014 FINAL  Final         Studies: Dg Chest 2 View  03/02/2014   CLINICAL DATA:  39 year old female with several day history of cough, shortness of breath and weakness  EXAM: CHEST  2 VIEW  COMPARISON:  Prior chest x-ray 02/28/2014  FINDINGS: Persistent low inspiratory volumes. No focal airspace consolidation. There may be minimal bibasilar atelectasis. No pleural effusion or pneumothorax. Cardiac and mediastinal contours are within normal limits. No acute osseous abnormality.  IMPRESSION: Low inspiratory volumes with trace bibasilar atelectasis. Otherwise, no acute cardiopulmonary process.   Electronically Signed   By: Jacqulynn Cadet M.D.   On: 03/02/2014 12:22   Dg Chest Port 1 View  02/28/2014   CLINICAL DATA:   Respiratory distress. Shortness of breath with cough and chest congestion.  EXAM: PORTABLE CHEST - 1 VIEW  COMPARISON:  02/20/2014  FINDINGS: Heart size and pulmonary vascularity are normal and the lungs are clear. No osseous abnormality.  IMPRESSION: Normal chest.   Electronically Signed   By: Rozetta Nunnery M.D.   On: 02/28/2014 17:21        Scheduled Meds: . ALPRAZolam  0.25 mg Oral BID  . dextromethorphan-guaiFENesin  1 tablet Oral BID  . heparin  5,000 Units Subcutaneous 3 times per day  . ipratropium-albuterol  3 mL Nebulization QID  . nystatin  5 mL Oral QID  . [START ON 03/03/2014] predniSONE  40 mg Oral Q breakfast   Continuous Infusions:    Principal Problem:   Asthma exacerbation Active Problems:   Current smoker   Atypical chest pain   Diabetes mellitus without complication   Anxiety   History of cardiac arrest    Time spent: 30 minutes.    Vernell Leep, MD, FACP, FHM. [facility] Pager [phone]  If 7PM-7AM, please contact night-coverage [email or web address] Password [email or web address] 03/02/2014, 1:06 PM    LOS: 2 days

## 2014-03-02 NOTE — Care Management Note (Signed)
    Page 1 of 2   03/04/2014     5:45:46 PM CARE MANAGEMENT NOTE 03/04/2014  Patient:  Marisa [name], Marisa [name]   Account Number:  [id number]  Date Initiated:  03/02/2014  Documentation initiated by:  AMERSON,JULIE  Subjective/Objective Assessment:   Pt adm on 02/28/14 with asthma exacerbation.  PTA, pt independent, lives with spouse.     Action/Plan:   Will follow for dc needs as pt progresses.   Anticipated DC Date:  03/03/2014   Anticipated DC Plan:  [place]  CM consult      Choice offered to / List presented to:     DME arranged  Vassie Moselle      DME agency  [place] arranged  [place] - 11 Patient Refused      Status of service:  Completed, signed off Medicare Important Message given?   (If response is "NO", the following Medicare IM given date fields will be blank) Date Medicare IM given:   Medicare IM given by:   Date Additional Medicare IM given:   Additional Medicare IM given by:    Discharge Disposition:  HOME/SELF CARE  Per UR Regulation:  Reviewed for med. necessity/level of care/duration of stay  If discussed at [place] of Stay Meetings, dates discussed:    Comments:  03/04/14 17:25 CM met with pt in room to offer choice of home health agency and pt is refusing [place] services.  [facility] DME rep has left for the day and pt is taking a loaner walker home tonight which spouse will return in the am and swap out for the [facility] DME rolling walker; (this is to not delay discharge).  No other CM needs were communicated. [name], BSN, [place].  03/03/14 Ellan Lambert, RN, BSN [phone] Met with pt to discuss dc plans.  PT recommending HHPT with 24hr supervision--pt refuses SNF for rehab.    Pt lives with husband, but states he is not there all the time. Encouraged pt to discuss care options with other family members, as private duty care not covered by insurance.  Pt given private duty sitter list for  reference, if needed. Pt also given [place] list of providers for review. Would recommend HHRN for follow up with HHPT.  Pt also need RW.  MD, please leave orders for [facility], DME, with face to face documentation.  Thanks.

## 2014-03-02 NOTE — Progress Notes (Signed)
Name: Marisa [name] MRN: [id number] DOB: October 30, 1975    ADMISSION DATE:  02/28/2014 CONSULTATION DATE:  03/02/2014  REFERRING MD :  Algis Liming  CHIEF COMPLAINT:  Asthma Exacerbation  BRIEF PATIENT DESCRIPTION: 39 y.o. F with hx of Asthma brought to [facility] ED 01/12 for SOB and chest tightness felt to be due to asthma exacerbation.  She was recently hospitalized for same, just discharged 5 days prior.  PCCM consulted for recs.  SIGNIFICANT EVENTS  01/04 through 01/07 - admitted for asthma exacerbation 01/12 - readmitted for same 01/13 - PCCM consulted  STUDIES:  CXR 01/12 >>> neg   SUBJECTIVE:  Still c/o SOB, intermittent.  States cough is now productive of green sputum.  Very hoarse/ raspy voice.  Feels very anxious.  Wakes up with SOB.   VITAL SIGNS: Temp:  [97.3 F (36.3 C)-97.9 F (36.6 C)] 97.3 F (36.3 C) (01/14 0521) Pulse Rate:  [86-102] 86 (01/14 0810) Resp:  [20-24] 20 (01/14 0521) BP: (116-155)/(67-96) 123/74 mmHg (01/14 0810) SpO2:  [96 %-100 %] 96 % (01/14 0857) Weight:  [254 lb 6.4 oz (115.395 kg)] 254 lb 6.4 oz (115.395 kg) (01/14 0521)  PHYSICAL EXAMINATION: General: Obese female, resting in bed watching TV, in NAD, anxious. Neuro: A&O x 3, non-focal.  HEENT: [place]/AT. PERRL, sclerae anicteric. Cardiovascular: RRR, no M/R/G.  Lungs: Respirations shallow and unlabored.  +upper airway wheeze. VERY hoarse voice.  Abdomen: BS x 4, soft, NT/ND.  Musculoskeletal: No gross deformities, scant BLE edema  Skin: Intact, warm, no rashes.   Recent Labs Lab 02/28/14 1630 03/01/14 0424 03/02/14 0415  NA 137 132* 137  K 4.2 4.4 4.7  CL 100 98 99  CO2 23 24 23   BUN 13 12 16   CREATININE 0.83 0.73 0.83  GLUCOSE 138* 166* 169*    Recent Labs Lab 02/28/14 1630 03/01/14 0424 03/02/14 0415  HGB 13.9 12.9 12.3  HCT 40.4 38.1 36.9  WBC 19.7* 24.7* 25.0*  PLT 421* 418* 395   Dg Chest Port 1 View  02/28/2014   CLINICAL DATA:  Respiratory distress. Shortness of breath  with cough and chest congestion.  EXAM: PORTABLE CHEST - 1 VIEW  COMPARISON:  02/20/2014  FINDINGS: Heart size and pulmonary vascularity are normal and the lungs are clear. No osseous abnormality.  IMPRESSION: Normal chest.   Electronically Signed   By: Rozetta Nunnery M.D.   On: 02/28/2014 17:21    ASSESSMENT / PLAN:  Dyspnea - Although some of this could be explained by pts underlying ?asthma +/- bronchitis, I suspect that a great deal of this is r/t anxiety and possible VCD.   Asthma exacerbation ?OSA Anxiety   Recs: CXR now given worsening cough, purulent sputum Hold off on abx for now  Continue supplemental O2 to maintain SpO2 > 92%. Lorazepam dose adjusted. Continue DuoNebs, xopenex PRN, mucinex. Change steroids to PO and taper Flutter valve. anxiolytic -- will change to very low dose xanax scheduled. Has been getting IV ativan PRN.  Will need outpt pulm f/u with repeat PFT's once over this acute illness.   Nickolas Madrid, NP 03/02/2014  9:59 AM Pager: [phone] or [phone]   Attending Note:  I have examined patient, reviewed labs, studies and notes. I have discussed the case with Earleen Newport, and I agree with the data and plans as amended above.  Baltazar Apo, MD, PhD 03/02/2014, 12:35 PM [facility] [phone] or if no answer [phone]

## 2014-03-03 LAB — GLUCOSE, CAPILLARY
Glucose-Capillary: 182 mg/dL — ABNORMAL HIGH (ref 70–99)
Glucose-Capillary: 144 mg/dL — ABNORMAL HIGH (ref 70–99)
Glucose-Capillary: 148 mg/dL — ABNORMAL HIGH (ref 70–99)
Glucose-Capillary: 114 mg/dL — ABNORMAL HIGH (ref 70–99)

## 2014-03-03 MED ORDER — CALCIUM CARBONATE ANTACID 500 MG PO CHEW
3.0000 | CHEWABLE_TABLET | Freq: Three times a day (TID) | ORAL | Status: DC | PRN
  Administered 2014-03-03: 400 mg via ORAL
  Administered 2014-03-03: 600 mg via ORAL
  Filled 2014-03-03 (×5): qty 3

## 2014-03-03 NOTE — Evaluation (Signed)
Physical Therapy Evaluation Patient Details Name: Marisa [name] MRN: [id number] DOB: April 19, 1975 Today's Date: 03/03/2014   History of Present Illness  39 y.o. female with past medical history of asthma (since age 1 years), ongoing tobacco abuse, anxiety disorder, diabetes mellitus, remote history of cardiac arrest during the child delivery, who presented with shortness of breath and chest pain felt to be due to asthma exacerbation.   Clinical Impression  Pt with noted inconsistencies throughout evaluation. Pt demo labored breathing on RA, O2 sats remained at 100% while ambulating short distance on RA. Pt is a fall risk at this time. Pt reports she does not have 24/7 (A) but stated "i can go home if i have the equipment i need". Pt to benefit from skilled acute PT to address deficits indicated below (see PT problem list). Notified RN of inconsistencies and that pt is a fall risk. Pt refusing SNF at this time. recommend D/C home with RW for balance.    Follow Up Recommendations Home health PT;Supervision/Assistance - 24 hour;Other (comment) (refusing SNF )    Equipment Recommendations  Rolling walker with 5" wheels    Recommendations for Other Services       Precautions / Restrictions Precautions Precautions: Fall Precaution Comments: inconsistencies noted with strength  Restrictions Weight Bearing Restrictions: No      Mobility  Bed Mobility Overal bed mobility: Modified Independent             General bed mobility comments: returned to supine without (A); was using handrails   Transfers Overall transfer level: Needs assistance Equipment used: Rolling walker (2 wheeled) Transfers: Sit to/from Stand Sit to Stand: +2 safety/equipment;Mod assist         General transfer comment: pt with shaky LEs and very unsteady; requiring max cues for safety ; pt with decr safety awareness and attempting to sit prior to reaching toilet and bed  Ambulation/Gait Ambulation/Gait  assistance: +2 safety/equipment;Min assist Ambulation Distance (Feet): 6 Feet Assistive device: Rolling walker (2 wheeled) Gait Pattern/deviations: Step-through pattern;Ataxic;Shuffle Gait velocity: decreased   General Gait Details: pt unsteady; demo decr safety awareness; requiring 2 person (A) for safety for short distances; was able to ambulate on RA and maintained O2 at 100%; max cues for safety with RW; LEs very shaky and weak during gt   Stairs Stairs:  (unsteady and unsafe to assess this session )          Wheelchair Mobility    Modified Rankin (Stroke Patients Only)       Balance Overall balance assessment: History of Falls;Needs assistance Sitting-balance support: Feet supported;No upper extremity supported Sitting balance-Leahy Scale: Good     Standing balance support: During functional activity;Bilateral upper extremity supported Standing balance-Leahy Scale: Zero Standing balance comment: 2 person (A) and RW; LEs shaky with WB'ing                              Pertinent Vitals/Pain Pain Assessment: No/denies pain    Home Living Family/patient expects to be discharged to:: Private residence Living Arrangements: Spouse/significant other;Other relatives Available Help at Discharge: Family;Available PRN/intermittently Type of Home: House Home Access: Stairs to enter Entrance Stairs-Rails: Right Entrance Stairs-Number of Steps: 4 Home Layout: One level Home Equipment: None Additional Comments: pt has tub shower    Prior Function Level of Independence: Independent               Hand Dominance  Extremity/Trunk Assessment   Upper Extremity Assessment: Defer to OT evaluation           Lower Extremity Assessment: Generalized weakness (inconsistencies noted; LEs shaking with WB'ing )      Cervical / Trunk Assessment: Normal  Communication   Communication: No difficulties  Cognition Arousal/Alertness: Awake/alert Behavior  During Therapy: Anxious Overall Cognitive Status: No family/caregiver present to determine baseline cognitive functioning                      General Comments General comments (skin integrity, edema, etc.): discussed D/C recommendations; see impression for O2 sats    Exercises        Assessment/Plan    PT Assessment Patient needs continued PT services  PT Diagnosis Difficulty walking;Generalized weakness   PT Problem List Decreased strength;Decreased activity tolerance;Decreased balance;Decreased mobility;Decreased knowledge of use of DME;Decreased safety awareness;Obesity  PT Treatment Interventions DME instruction;Gait training;Stair training;Functional mobility training;Therapeutic activities;Therapeutic exercise;Balance training;Neuromuscular re-education;Patient/family education   PT Goals (Current goals can be found in the Care Plan section) Acute Rehab PT Goals Patient Stated Goal: "to have all the equipment i need to go home" PT Goal Formulation: With patient Time For Goal Achievement: 03/10/14 Potential to Achieve Goals: Fair    Frequency Min 3X/week   Barriers to discharge Decreased caregiver support caregiver for sister; husband works during the day    Co-evaluation               End of [place] During Treatment: Gait belt Activity Tolerance: Patient limited by fatigue Patient left: in bed;with call bell/phone within reach Nurse Communication: Mobility status;Precautions         Time: 7622-6333 PT Time Calculation (min) (ACUTE ONLY): 15 min   Charges:   PT Evaluation $Initial PT Evaluation Tier I: 1 Procedure PT Treatments $Gait Training: 8-22 mins   PT G CodesGustavus Bryant, [place]  [phone] 03/03/2014, 12:11 PM

## 2014-03-03 NOTE — Progress Notes (Signed)
PROGRESS NOTE    Marisa [name] UYQ:[id number] DOB: 16-Jul-1975 DOA: 02/28/2014 PCP: Angelica Chessman, MD  Primary Pulmonologist: Dr. Danton Sewer  HPI/Brief narrative 39 y.o. female with past medical history of asthma (since age 26 years), ongoing tobacco abuse, anxiety disorder, diabetes mellitus, remote history of cardiac arrest during the child delivery, who presented with shortness of breath and chest pain felt to be due to asthma exacerbation. She was recently hospitalized for same and discharged 5 days prior. Work up in the ED demonstrates negative chest x-ray for acute abnormalities. The negative troponin. Leukocytosis with WBC 19.7 (patient is on prednisone). Arterial ABG showed respiratory alkalosis (pH 7.425, PCO2 38.7, PO2 85, bicarbonate 25.4). Patient is admitted to inpatient for further evaluation and treatment.    Assessment/Plan:  1. Acute asthma exacerbation: This is her second admission within a week for same presentation. States that she has been an asthmatic since age 89 years. Volunteers to using rescue inhalers anywhere between 7-13 times daily even on her "good days". Continues to smoke. Given poorly controlled baseline asthma and current recurrent exacerbation, consulted pulmonology whose input appreciated. Continue oxygen, bronchodilator nebulization adjusted, when necessary Ativan (anxiety felt to be playing a role), steroids, Mucinex and flutter valve. Recently completed a course of antibiotics. Repeat chest x-ray: Basilar atelectasis but no acute findings. As per pulmonary, worsening dyspnea probably due to? asthma +/- bronchitis, anxiety and possible VCD. Improving. Pulmonary has transitioned patient to oral prednisone taper. Possible DC 1/16. 2. Anxiety: Switch to low-dose scheduled Xanax. 3. Tobacco abuse: Cessation counseled 4. Atypical chest pain: Related to problem #1. D-dimer negative. Recent CTA chest without PE. No further chest pains.  5. Uncontrolled  type II DM: Continue NovoLog SSI while on steroids. 6. Morbid obesity 7. Migraine: Had an episode on 1/13. Resolved after a dose of IV Depacon 8. Leukocytosis: Likely secondary to steroids. No clinical focus of infection.   Code Status: Full Family Communication: None at bedside Disposition Plan: Home when medically stable-possibly 1/16   Consultants:  Pulmonology  Procedures:  None  Antibiotics:  None   Subjective: Headache resolved. Dyspnea continues to gradually improve.  Objective: Filed Vitals:   03/02/14 2113 03/03/14 0623 03/03/14 0937 03/03/14 1514  BP: 134/81 109/70    Pulse: 96 89    Temp: 97.5 F (36.4 C) 97.6 F (36.4 C)    TempSrc: Oral Oral    Resp: 20 18    Height:      Weight:  116.393 kg (256 lb 9.6 oz)    SpO2: 99% 94% 98% 94%    Intake/Output Summary (Last 24 hours) at 03/03/14 1626 Last data filed at 03/03/14 1321  Gross per 24 hour  Intake   1282 ml  Output   1875 ml  Net   -593 ml   Filed Weights   03/01/14 0620 03/02/14 0521 03/03/14 0623  Weight: 111 kg (244 lb 11.4 oz) 115.395 kg (254 lb 6.4 oz) 116.393 kg (256 lb 9.6 oz)     Exam:  General exam: Pleasant young female sitting up comfortably in bed. Respiratory system: Improved/fair breath sounds bilaterally.  No wheezing, rhonchi or crackles. No increased work of breathing. Able to speak in full sentences. Cardiovascular system: S1 & S2 heard, RRR. No JVD, murmurs, gallops, clicks or pedal edema. Telemetry: Sinus rhythm. Gastrointestinal system: Abdomen is nondistended, soft and nontender. Normal bowel sounds heard. Central nervous system: Alert and oriented. No focal neurological deficits. Extremities: Symmetric 5 x 5 power.   Data Reviewed:  Basic Metabolic Panel:  Recent Labs Lab 02/28/14 1630 03/01/14 0424 03/02/14 0415  NA 137 132* 137  K 4.2 4.4 4.7  CL 100 98 99  CO2 23 24 23   GLUCOSE 138* 166* 169*  BUN 13 12 16   CREATININE 0.83 0.73 0.83  CALCIUM 9.9 8.3*  9.3   Liver Function Tests:  Recent Labs Lab 03/01/14 0424  AST 27  ALT 27  ALKPHOS 52  BILITOT 0.4  PROT 6.1  ALBUMIN 3.0*   No results for input(s): LIPASE, AMYLASE in the last 168 hours. No results for input(s): AMMONIA in the last 168 hours. CBC:  Recent Labs Lab 02/28/14 1630 03/01/14 0424 03/02/14 0415  WBC 19.7* 24.7* 25.0*  NEUTROABS  --  22.4*  --   HGB 13.9 12.9 12.3  HCT 40.4 38.1 36.9  MCV 91.8 92.3 95.8  PLT 421* 418* 395   Cardiac Enzymes:  Recent Labs Lab 02/28/14 2220 03/01/14 0424 03/01/14 0926  TROPONINI <0.03 <0.30* <0.03   BNP (last 3 results) No results for input(s): PROBNP in the last 8760 hours. CBG:  Recent Labs Lab 03/02/14 1113 03/02/14 1649 03/02/14 2111 03/03/14 0621 03/03/14 1127  GLUCAP 188* 145* 237* 182* 144*    Recent Results (from the past 240 hour(s))  Culture, blood (routine x 2) Call MD if unable to obtain prior to antibiotics being given     Status: None (Preliminary result)   Collection Time: 02/28/14 10:20 PM  Result Value Ref Range Status   Specimen Description BLOOD LEFT ARM  Final   Special Requests BOTTLES DRAWN AEROBIC AND ANAEROBIC 10CC EACH  Final   Culture   Final           BLOOD CULTURE RECEIVED NO GROWTH TO DATE CULTURE WILL BE HELD FOR 5 DAYS BEFORE ISSUING A FINAL NEGATIVE REPORT Performed at [facility]    Report Status PENDING  Incomplete  Culture, blood (routine x 2) Call MD if unable to obtain prior to antibiotics being given     Status: None (Preliminary result)   Collection Time: 02/28/14 10:29 PM  Result Value Ref Range Status   Specimen Description BLOOD RIGHT HAND  Final   Special Requests BOTTLES DRAWN AEROBIC ONLY 4CC  Final   Culture   Final           BLOOD CULTURE RECEIVED NO GROWTH TO DATE CULTURE WILL BE HELD FOR 5 DAYS BEFORE ISSUING A FINAL NEGATIVE REPORT Performed at [facility]    Report Status PENDING  Incomplete  Culture, sputum-assessment     Status:  None   Collection Time: 03/01/14  6:43 PM  Result Value Ref Range Status   Specimen Description SPUTUM  Final   Special Requests NONE  Final   Sputum evaluation   Final    MICROSCOPIC FINDINGS SUGGEST THAT THIS SPECIMEN IS NOT REPRESENTATIVE OF LOWER RESPIRATORY SECRETIONS. PLEASE RECOLLECT. CALLED TO G.RASUL, RN AT 2145 BY L.PITT 03/01/14    Report Status 03/02/2014 FINAL  Final         Studies: Dg Chest 2 View  03/02/2014   CLINICAL DATA:  39 year old female with several day history of cough, shortness of breath and weakness  EXAM: CHEST  2 VIEW  COMPARISON:  Prior chest x-ray 02/28/2014  FINDINGS: Persistent low inspiratory volumes. No focal airspace consolidation. There may be minimal bibasilar atelectasis. No pleural effusion or pneumothorax. Cardiac and mediastinal contours are within normal limits. No acute osseous abnormality.  IMPRESSION: Low inspiratory volumes with  trace bibasilar atelectasis. Otherwise, no acute cardiopulmonary process.   Electronically Signed   By: Jacqulynn Cadet M.D.   On: 03/02/2014 12:22        Scheduled Meds: . ALPRAZolam  0.25 mg Oral BID  . dextromethorphan-guaiFENesin  1 tablet Oral BID  . heparin  5,000 Units Subcutaneous 3 times per day  . ipratropium-albuterol  3 mL Nebulization QID  . nystatin  5 mL Oral QID  . predniSONE  40 mg Oral Q breakfast   Continuous Infusions:    Principal Problem:   Asthma exacerbation Active Problems:   Current smoker   Atypical chest pain   Diabetes mellitus without complication   Anxiety   History of cardiac arrest    Time spent: 30 minutes.    Vernell Leep, MD, FACP, FHM. [facility] Pager [phone]  If 7PM-7AM, please contact night-coverage [email or web address] Password [email or web address] 03/03/2014, 4:26 PM    LOS: 3 days

## 2014-03-03 NOTE — Progress Notes (Signed)
Name: Marisa [name] MRN: [id number] DOB: 1976-02-13    ADMISSION DATE:  02/28/2014 CONSULTATION DATE:  03/03/2014  REFERRING MD :  Algis Liming  CHIEF COMPLAINT:  Asthma Exacerbation  BRIEF PATIENT DESCRIPTION: 39 y.o. F with hx of Asthma brought to [facility] ED 01/12 for SOB and chest tightness felt to be due to asthma exacerbation.  She was recently hospitalized for same, just discharged 5 days prior.  PCCM consulted for recs.  SIGNIFICANT EVENTS  01/04 - 01/07 - admitted for asthma exacerbation 01/12  Readmitted for same 01/13  PCCM consulted 01/14  C/o's SOB, cough, anxious.  Wakes up with SOB  01/15  Reports feeling better but continues to feel tight in upper airway  STUDIES:  CXR 01/12 >>> neg CXR 1/14 >> neg   SUBJECTIVE: Reports feeling better but continues to feel tight in upper airway and HA with "knives in head coming out eyes".  Concerned about return to work date.    VITAL SIGNS: Temp:  [97.5 F (36.4 C)-97.6 F (36.4 C)] 97.6 F (36.4 C) (01/15 0623) Pulse Rate:  [79-96] 89 (01/15 0623) Resp:  [18-20] 18 (01/15 0623) BP: (109-134)/(70-81) 109/70 mmHg (01/15 0623) SpO2:  [94 %-99 %] 98 % (01/15 0937) Weight:  [256 lb 9.6 oz (116.393 kg)] 256 lb 9.6 oz (116.393 kg) (01/15 7408)  PHYSICAL EXAMINATION: General: Obese female, resting in bed watching TV, in NAD, anxious. Neuro: A&O x 3, non-focal.  HEENT: /AT. PERRL, sclerae anicteric. Cardiovascular: RRR, no M/R/G.  Lungs: Respirations shallow and unlabored. Essentially clear.  VERY hoarse voice.  Abdomen: BS x 4, soft, NT/ND.  Musculoskeletal: No gross deformities, scant BLE edema  Skin: Intact, warm, no rashes.   Recent Labs Lab 02/28/14 1630 03/01/14 0424 03/02/14 0415  NA 137 132* 137  K 4.2 4.4 4.7  CL 100 98 99  CO2 23 24 23   BUN 13 12 16   CREATININE 0.83 0.73 0.83  GLUCOSE 138* 166* 169*    Recent Labs Lab 02/28/14 1630 03/01/14 0424 03/02/14 0415  HGB 13.9 12.9 12.3  HCT 40.4 38.1 36.9   WBC 19.7* 24.7* 25.0*  PLT 421* 418* 395   Dg Chest 2 View  03/02/2014   CLINICAL DATA:  39 year old female with several day history of cough, shortness of breath and weakness  EXAM: CHEST  2 VIEW  COMPARISON:  Prior chest x-ray 02/28/2014  FINDINGS: Persistent low inspiratory volumes. No focal airspace consolidation. There may be minimal bibasilar atelectasis. No pleural effusion or pneumothorax. Cardiac and mediastinal contours are within normal limits. No acute osseous abnormality.  IMPRESSION: Low inspiratory volumes with trace bibasilar atelectasis. Otherwise, no acute cardiopulmonary process.   Electronically Signed   By: Jacqulynn Cadet M.D.   On: 03/02/2014 12:22    ASSESSMENT / PLAN:  Dyspnea - although some of this could be explained by pts underlying ?asthma +/- bronchitis, I suspect that a great deal of this is r/t anxiety and possible VCD.  CXR 1/14 was negative for acute process.   Asthma exacerbation vs Upper airway disease > I suspect the latter ?OSA Anxiety   Recs: Hold off on abx for now  Await RVP >>  Continue supplemental O2 to maintain SpO2 > 92%. Continue DuoNebs, xopenex PRN, mucinex. Prednisone 40 mg QD with taper to off over 5-7 days Flutter valve / pulmonary hygiene  Anxiolytic -- very low dose xanax scheduled.  IV ativan PRN.  Will need outpt pulm f/u with repeat PFT's once over this acute illness.  PCCM will sign off.  Please call back if new needs arise.     [name], NP-C [facility] Pgr: [phone] or [phone]  Attending Note:  I have examined patient, reviewed labs, studies and notes. I have discussed the case with B Ollis, and I agree with the data and plans as amended above.   Baltazar Apo, MD, PhD 03/03/2014, 2:47 PM [facility] [phone] or if no answer [phone]

## 2014-03-04 LAB — GLUCOSE, CAPILLARY
Glucose-Capillary: 104 mg/dL — ABNORMAL HIGH (ref 70–99)
Glucose-Capillary: 138 mg/dL — ABNORMAL HIGH (ref 70–99)
Glucose-Capillary: 116 mg/dL — ABNORMAL HIGH (ref 70–99)

## 2014-03-04 MED ORDER — ALPRAZOLAM 0.25 MG PO TABS: 0.2500 mg | ORAL_TABLET | Freq: Two times a day (BID) | ORAL | Status: DC | PRN

## 2014-03-04 MED ORDER — ALPRAZOLAM 0.25 MG PO TABS: 0.2500 mg | ORAL_TABLET | Freq: Two times a day (BID) | ORAL | Status: DC

## 2014-03-04 MED ORDER — PREDNISONE 10 MG PO TABS: ORAL_TABLET | ORAL | Status: DC

## 2014-03-04 NOTE — Progress Notes (Signed)
SATURATION QUALIFICATIONS: (This note is used to comply with regulatory documentation for home oxygen)  Patient Saturations on Room Air at Rest = 96%  Patient Saturations on Room Air while Ambulating = 98%  Patient Saturations on  N/A Liters of oxygen while Ambulating = N/A  Please briefly explain why patient needs home oxygen: Patient will not need home oxygen. She does appear to be unsteady with ambulation with walker. Heart rate on Dinamap appears to increase to 121 with ambulation. Unsteady with walker. C/o dizziness. MD paged and made aware.

## 2014-03-04 NOTE — Discharge Instructions (Signed)

## 2014-03-04 NOTE — Progress Notes (Signed)
Pt. assisted w/ambulation down hall way with 2 people assist, while using the walker.  C/0 feeling week.  Assisted back to bed.  02 Sat at this time 98% RA.  Dr. Algis Liming informed and instructed to talk with pt regarding d/c per PT recommendation.  Will continue to monitor.  Karie Kirks, Therapist, sports.

## 2014-03-04 NOTE — Discharge Summary (Addendum)
Physician Discharge Summary  Marisa [name] WEX:[id number] DOB: 29-Jul-1975 DOA: 02/28/2014  PCP: Angelica Chessman, MD  Admit date: 02/28/2014 Discharge date: 03/04/2014  Time spent: Greater than 30 minutes  Recommendations for Outpatient Follow-up:  1. Dr. Angelica Chessman, PCP in 3 days. Please follow final blood cultures and RSV panel sent from the hospital. 2. Dr. Baltazar Apo, Pulmonology 3. Rolling walker. 4. Patient declined home health PT or SNF.  Discharge Diagnoses:  Principal Problem:   Asthma exacerbation Active Problems:   Current smoker   Atypical chest pain   Diabetes mellitus without complication   Anxiety   History of cardiac arrest   Discharge Condition: Improved & Stable  Diet recommendation: Heart healthy and diabetic diet.  Filed Weights   03/02/14 0521 03/03/14 0623 03/04/14 0500  Weight: 115.395 kg (254 lb 6.4 oz) 116.393 kg (256 lb 9.6 oz) 116.983 kg (257 lb 14.4 oz)    History of present illness:  39 y.o. 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 She was recently hospitalized for same and discharged 5 days prior. Work up in the ED demonstrates negative chest x-ray for acute abnormalities. The negative troponin. Leukocytosis with WBC 19.7 (patient is on prednisone). Arterial ABG showed respiratory alkalosis (pH 7.425, PCO2 38.7, PO2 85, bicarbonate 25.4). Patient is admitted to inpatient for further evaluation and treatment.  Hospital Course:    Acute asthma exacerbation: This is her second admission within a week for same presentation. States that she has been an asthmatic since age 32 years. Volunteers to using rescue inhalers anywhere between 7-13 times daily even on her "good days". Continues to smoke. Given poorly  controlled baseline asthma and current recurrent exacerbation, consulted pulmonology whose input appreciated. Continue oxygen, bronchodilator nebulization adjusted, when necessary Ativan (anxiety felt to be playing a role), steroids, Mucinex and flutter valve. Recently completed a course of antibiotics. Repeat chest x-ray: Basilar atelectasis but no acute findings. As per pulmonary, worsening dyspnea probably due to? asthma +/- bronchitis, anxiety and possible VCD. Pulmonary has transitioned patient to oral prednisone taper. Asthma exacerbation seems to have resolved. She was not hypoxic on room air with activity. Anxiety seems to be playing a big role in her presentation. As per pulmonary, will need outpatient pulmonary follow-up with repeat PFTs once over this acute illness.  Anxiety: Patient was placed on low-dose Xanax for anxiety. She is given a short supply until close follow-up with PCP and recommend outpatient psychiatric consultation.  Tobacco abuse: Cessation counseled. Patient's spouse also smokes exposing her to passive inhalation. Counseled on avoiding all kind of smoke exposure. She verbalized understanding.  Atypical chest pain: Related to problem #1. D-dimer negative. Recent CTA chest without PE. No further chest pains.   Uncontrolled type II DM: Continue NovoLog SSI while on steroids. Continue metformin.  Morbid obesity  Migraine: Had an episode on 1/13. Resolved after a dose of IV Depacon  Leukocytosis: Likely secondary to steroids. No clinical focus of infection. May consider repeating CBC after she has completed her steroid taper.  Weakness: PT evaluated her on 03/03/14 (please see detailed note) and noted inconsistencies throughout evaluation and patient refused SNF. PT at that time recommended home health PT which patient has declined today. She is however agreeable to a walker which will be arranged. She has been counseled  to pursue SNF or home health PT services given  increased risk of falls and related complications. She however declines this and wants to go home.  Consultants:  Pulmonology  Procedures:  None   Discharge Exam:  Complaints: States that breathing feels "fine". Does complain of feeling anxious at times. No reported headache.  Filed Vitals:   03/03/14 2128 03/04/14 0500 03/04/14 1014 03/04/14 1430  BP:  119/69  90/73  Pulse:  90  85  Temp:  98 F (36.7 C)  98.3 F (36.8 C)  TempSrc:  Oral  Oral  Resp:  18  18  Height:      Weight:  116.983 kg (257 lb 14.4 oz)    SpO2: 100% 98% 98% 98%    General exam: Pleasant young female sitting up comfortably in bed without oxygen appeared pleasant and comfortable this morning. Respiratory system: Clear to auscultation without wheezing, rhonchi or crackles. No increased work of breathing. Cardiovascular system: S1 & S2 heard, RRR. No JVD, murmurs, gallops, clicks or pedal edema.  Gastrointestinal system: Abdomen is nondistended, soft and nontender. Normal bowel sounds heard. Central nervous system: Alert and oriented. No focal neurological deficits. Extremities: Symmetric 5 x 5 power.  Discharge Instructions      Discharge Instructions    Call MD for:  difficulty breathing, headache or visual disturbances    Complete by:  As directed      Diet - low sodium heart healthy    Complete by:  As directed      Diet Carb Modified    Complete by:  As directed      Increase activity slowly    Complete by:  As directed             Medication List    STOP taking these medications        levofloxacin 750 MG tablet  Commonly known as:  LEVAQUIN      TAKE these medications        albuterol 108 (90 BASE) MCG/ACT inhaler  Commonly known as:  PROVENTIL HFA;VENTOLIN HFA  Inhale 1-2 puffs into the lungs every 6 (six) hours as needed for wheezing or shortness of breath.     ALPRAZolam 0.25 MG tablet  Commonly known as:  XANAX  Take 1 tablet (0.25 mg total) by mouth 2 (two) times  daily as needed for anxiety.     freestyle lancets  Use as instructed     glucose blood test strip  Use as instructed     glucose monitoring kit monitoring kit  1 each by Does not apply route as needed for other.     ipratropium-albuterol 0.5-2.5 (3) MG/3ML Soln  Commonly known as:  DUONEB  Take 3 mLs by nebulization every 6 (six) hours as needed.     metFORMIN 500 MG tablet  Commonly known as:  GLUCOPHAGE  Take 1 tablet (500 mg total) by mouth 2 (two) times daily with a meal.     nystatin 100000 UNIT/ML suspension  Commonly known as:  MYCOSTATIN  Take 5 mLs (500,000 Units total) by mouth 4 (four) times daily.     predniSONE 10 MG tablet  Commonly known as:  DELTASONE  Take 4 tabs daily 1 day, then 3 tabs daily 3 days, then 2 tabs daily 3 days, then 1 tab daily 3 days, then stop.     zolpidem 5 MG tablet  Commonly known as:  AMBIEN  Take 1 tablet (5 mg total) by mouth at bedtime  as needed for sleep.       Follow-up Information    Follow up with Angelica Chessman, MD. Schedule an appointment as soon as possible for a visit in 3 days.   Specialty:  Internal Medicine   Contact information:   [place] [address] [phone]       Follow up with Collene Gobble., MD.   Specialty:  Pulmonary Disease   Contact information:   [address] [place] [address] [phone]        The results of significant diagnostics from this hospitalization (including imaging, microbiology, ancillary and laboratory) are listed below for reference.    Significant Diagnostic Studies: Dg Chest 2 View  03/02/2014   CLINICAL DATA:  40 year old female with several day history of cough, shortness of breath and weakness  EXAM: CHEST  2 VIEW  COMPARISON:  Prior chest x-ray 02/28/2014  FINDINGS: Persistent low inspiratory volumes. No focal airspace consolidation. There may be minimal bibasilar atelectasis. No pleural effusion or pneumothorax. Cardiac and  mediastinal contours are within normal limits. No acute osseous abnormality.  IMPRESSION: Low inspiratory volumes with trace bibasilar atelectasis. Otherwise, no acute cardiopulmonary process.   Electronically Signed   By: Jacqulynn Cadet M.D.   On: 03/02/2014 12:22   Dg Chest 2 View  02/20/2014   CLINICAL DATA:  Shortness of breath.  EXAM: CHEST  2 VIEW  COMPARISON:  October 10, 2013.  FINDINGS: The heart size and mediastinal contours are within normal limits. Both lungs are clear. No pneumothorax or pleural effusion is noted. The visualized skeletal structures are unremarkable.  IMPRESSION: No acute cardiopulmonary abnormality seen.   Electronically Signed   By: Sabino Dick M.D.   On: 02/20/2014 13:53   Ct Angio Chest Pe W/cm &/or Wo Cm  02/20/2014   CLINICAL DATA:  Acute onset of shortness of breath earlier today. Patient currently being treated for tonsillitis. Current history of asthma but the patient has run out of her albuterol inhaler.  EXAM: CT ANGIOGRAPHY CHEST WITH CONTRAST  TECHNIQUE: Multidetector CT imaging of the chest was performed using the standard protocol during bolus administration of intravenous contrast. Multiplanar CT image reconstructions and MIPs were obtained to evaluate the vascular anatomy.  CONTRAST:  162m OMNIPAQUE IOHEXOL 350 MG/ML IV.  COMPARISON:  None.  FINDINGS: Contrast opacification of the pulmonary arteries is fair to good. No filling defects within either main pulmonary artery or their branches in either lung to suggest pulmonary embolism. Heart size upper normal. No visible coronary atherosclerosis. No pericardial effusion. No visible atherosclerosis involving the thoracic or upper abdominal aorta. No made of bovine aortic arch anatomy (left common carotid artery arises from the innominate artery).  Markedly suboptimal inspiration which accounts for the atelectasis in the lower lobes. Lungs otherwise clear without localized airspace consolidation, interstitial disease,  or parenchymal nodules. No pleural effusions. Central airways patent with moderate bronchial wall thickening.  No significant mediastinal, hilar or axillary lymphadenopathy. Mild enlargement of the visualized thyroid gland without nodularity.  Visualized upper abdomen unremarkable for the early phase of enhancement. Bone window images unremarkable.  Review of the MIP images confirms the above findings.  IMPRESSION: 1. No evidence of pulmonary embolism. 2. Suboptimal inspiration accounts for atelectasis in the lower lobes. Moderate central bronchial wall thickening consistent with asthma and/or bronchitis. No acute cardiopulmonary disease otherwise.   Electronically Signed   By: TEvangeline DakinM.D.   On: 02/20/2014 17:51   [facility]  [facility]   CLINICAL DATA:  Respiratory distress. Shortness of breath with cough and chest congestion.  EXAM: PORTABLE CHEST - 1 VIEW  COMPARISON:  02/20/2014  FINDINGS: Heart size and pulmonary vascularity are normal and the lungs are clear. No osseous abnormality.  IMPRESSION: Normal chest.   Electronically Signed   By: Rozetta Nunnery M.D.   On: 02/28/2014 17:21    Microbiology: Recent Results (from the past 240 hour(s))  Culture, blood (routine x 2) Call MD if unable to obtain prior to antibiotics being given     Status: None (Preliminary result)   Collection Time: 02/28/14 10:20 PM  Result Value Ref Range Status   Specimen Description BLOOD LEFT ARM  Final   Special Requests BOTTLES DRAWN AEROBIC AND ANAEROBIC 10CC EACH  Final   Culture   Final           BLOOD CULTURE RECEIVED NO GROWTH TO DATE CULTURE WILL BE HELD FOR 5 DAYS BEFORE ISSUING A FINAL NEGATIVE REPORT Performed at [facility]    Report Status PENDING  Incomplete  Culture, blood (routine x 2) Call MD if unable to obtain prior to antibiotics being given     Status: None (Preliminary result)   Collection Time: 02/28/14 10:29 PM  Result Value Ref Range Status   Specimen Description  BLOOD RIGHT HAND  Final   Special Requests BOTTLES DRAWN AEROBIC ONLY 4CC  Final   Culture   Final           BLOOD CULTURE RECEIVED NO GROWTH TO DATE CULTURE WILL BE HELD FOR 5 DAYS BEFORE ISSUING A FINAL NEGATIVE REPORT Performed at [facility]    Report Status PENDING  Incomplete  Culture, sputum-assessment     Status: None   Collection Time: 03/01/14  6:43 PM  Result Value Ref Range Status   Specimen Description SPUTUM  Final   Special Requests NONE  Final   Sputum evaluation   Final    MICROSCOPIC FINDINGS SUGGEST THAT THIS SPECIMEN IS NOT REPRESENTATIVE OF LOWER RESPIRATORY SECRETIONS. PLEASE RECOLLECT. CALLED TO G.RASUL, RN AT 2145 BY L.PITT 03/01/14    Report Status 03/02/2014 FINAL  Final     Labs: Basic Metabolic Panel:  Recent Labs Lab 02/28/14 1630 03/01/14 0424 03/02/14 0415  NA 137 132* 137  K 4.2 4.4 4.7  CL 100 98 99  CO2 '23 24 23  ' GLUCOSE 138* 166* 169*  BUN '13 12 16  ' CREATININE 0.83 0.73 0.83  CALCIUM 9.9 8.3* 9.3   Liver Function Tests:  Recent Labs Lab 03/01/14 0424  AST 27  ALT 27  ALKPHOS 52  BILITOT 0.4  PROT 6.1  ALBUMIN 3.0*   No results for input(s): LIPASE, AMYLASE in the last 168 hours. No results for input(s): AMMONIA in the last 168 hours. CBC:  Recent Labs Lab 02/28/14 1630 03/01/14 0424 03/02/14 0415  WBC 19.7* 24.7* 25.0*  NEUTROABS  --  22.4*  --   HGB 13.9 12.9 12.3  HCT 40.4 38.1 36.9  MCV 91.8 92.3 95.8  PLT 421* 418* 395   Cardiac Enzymes:  Recent Labs Lab 02/28/14 2220 03/01/14 0424 03/01/14 0926  TROPONINI <0.03 <0.30* <0.03   BNP: BNP (last 3 results) No results for input(s): PROBNP in the last 8760 hours. CBG:  Recent Labs Lab 03/03/14 1637 03/03/14 2157 03/04/14 0554 03/04/14 1115 03/04/14 1657  GLUCAP 148* 114* 104* 138* 116*     Additional labs: 1. Influenza panel PCR: Negative. 2. HIV 1 and  2 antibodies: Nonreactive   Signed:  Vernell Leep, MD, FACP, FHM. [facility] Pager [phone]  If 7PM-7AM, please contact night-coverage [email or web address] Password [email or web address] 03/04/2014, 6:50 PM

## 2014-03-06 ENCOUNTER — Encounter: Payer: Self-pay | Admitting: Internal Medicine

## 2014-03-06 ENCOUNTER — Ambulatory Visit: Payer: 59 | Attending: Internal Medicine | Admitting: Internal Medicine

## 2014-03-06 VITALS — BP 122/89 | HR 90 | Temp 98.0°F | Resp 16

## 2014-03-06 DIAGNOSIS — G4700 Insomnia, unspecified: Secondary | ICD-10-CM

## 2014-03-06 DIAGNOSIS — J45909 Unspecified asthma, uncomplicated: Secondary | ICD-10-CM | POA: Insufficient documentation

## 2014-03-06 DIAGNOSIS — Z7952 Long term (current) use of systemic steroids: Secondary | ICD-10-CM | POA: Insufficient documentation

## 2014-03-06 DIAGNOSIS — F32A Depression, unspecified: Secondary | ICD-10-CM

## 2014-03-06 DIAGNOSIS — Z9109 Other allergy status, other than to drugs and biological substances: Secondary | ICD-10-CM | POA: Diagnosis not present

## 2014-03-06 DIAGNOSIS — F329 Major depressive disorder, single episode, unspecified: Secondary | ICD-10-CM | POA: Diagnosis not present

## 2014-03-06 DIAGNOSIS — Z91018 Allergy to other foods: Secondary | ICD-10-CM | POA: Insufficient documentation

## 2014-03-06 DIAGNOSIS — G4733 Obstructive sleep apnea (adult) (pediatric): Secondary | ICD-10-CM

## 2014-03-06 DIAGNOSIS — Z79899 Other long term (current) drug therapy: Secondary | ICD-10-CM | POA: Insufficient documentation

## 2014-03-06 DIAGNOSIS — Z91013 Allergy to seafood: Secondary | ICD-10-CM | POA: Insufficient documentation

## 2014-03-06 DIAGNOSIS — Z880 Allergy status to penicillin: Secondary | ICD-10-CM | POA: Insufficient documentation

## 2014-03-06 DIAGNOSIS — E119 Type 2 diabetes mellitus without complications: Secondary | ICD-10-CM | POA: Insufficient documentation

## 2014-03-06 DIAGNOSIS — F419 Anxiety disorder, unspecified: Secondary | ICD-10-CM | POA: Diagnosis not present

## 2014-03-06 HISTORY — DX: Insomnia, unspecified: G47.00

## 2014-03-06 HISTORY — DX: Obstructive sleep apnea (adult) (pediatric): G47.33

## 2014-03-06 LAB — RESPIRATORY VIRUS PANEL
Adenovirus: NOT DETECTED
Influenza A H1: NOT DETECTED
Influenza A H3: NOT DETECTED
Influenza A: NOT DETECTED
Influenza B: NOT DETECTED
Metapneumovirus: NOT DETECTED
Parainfluenza 1: NOT DETECTED
Parainfluenza 2: NOT DETECTED
Parainfluenza 3: NOT DETECTED
Respiratory Syncytial Virus A: NOT DETECTED
Respiratory Syncytial Virus B: NOT DETECTED
Rhinovirus: DETECTED — AB

## 2014-03-06 MED ORDER — DULOXETINE HCL 30 MG PO CPEP: 30.0000 mg | ORAL_CAPSULE | Freq: Every day | ORAL | Status: DC

## 2014-03-06 MED ORDER — ZOLPIDEM TARTRATE 5 MG PO TABS: 5.0000 mg | ORAL_TABLET | Freq: Every evening | ORAL | Status: DC | PRN

## 2014-03-06 NOTE — Progress Notes (Signed)
Attempted calling phone numbers listed in chart, unable to get any reply to inform pt to bring loaner walker back to get new one.  Karie Kirks, Therapist, sports.

## 2014-03-06 NOTE — Patient Instructions (Addendum)
Diabetes and Exercise Exercising regularly is important. It is not just about losing weight. It has many health benefits, such as:  Improving your overall fitness, flexibility, and endurance.  Increasing your bone density.  Helping with weight control.  Decreasing your body fat.  Increasing your muscle strength.  Reducing stress and tension.  Improving your overall health. People with diabetes who exercise gain additional benefits because exercise:  Reduces appetite.  Improves the body's use of blood sugar (glucose).  Helps lower or control blood glucose.  Decreases blood pressure.  Helps control blood lipids (such as cholesterol and triglycerides).  Improves the body's use of the hormone insulin by:  Increasing the body's insulin sensitivity.  Reducing the body's insulin needs.  Decreases the risk for heart disease because exercising:  Lowers cholesterol and triglycerides levels.  Increases the levels of good cholesterol (such as high-density lipoproteins [HDL]) in the body.  Lowers blood glucose levels. YOUR ACTIVITY PLAN  Choose an activity that you enjoy and set realistic goals. Your health care provider or diabetes educator can help you make an activity plan that works for you. Exercise regularly as directed by your health care provider. This includes:  Performing resistance training twice a week such as push-ups, sit-ups, lifting weights, or using resistance bands.  Performing 150 minutes of cardio exercises each week such as walking, running, or playing sports.  Staying active and spending no more than 90 minutes at one time being inactive. Even short bursts of exercise are good for you. Three 10-minute sessions spread throughout the day are just as beneficial as a single 30-minute session. Some exercise ideas include:  Taking the dog for a walk.  Taking the stairs instead of the elevator.  Dancing to your favorite song.  Doing an exercise  video.  Doing your favorite exercise with a friend. RECOMMENDATIONS FOR EXERCISING WITH TYPE 1 OR TYPE 2 DIABETES   Check your blood glucose before exercising. If blood glucose levels are greater than 240 mg/dL, check for urine ketones. Do not exercise if ketones are present.  Avoid injecting insulin into areas of the body that are going to be exercised. For example, avoid injecting insulin into:  The arms when playing tennis.  The legs when jogging.  Keep a record of:  Food intake before and after you exercise.  Expected peak times of insulin action.  Blood glucose levels before and after you exercise.  The type and amount of exercise you have done.  Review your records with your health care provider. Your health care provider will help you to develop guidelines for adjusting food intake and insulin amounts before and after exercising.  If you take insulin or oral hypoglycemic agents, watch for signs and symptoms of hypoglycemia. They include:  Dizziness.  Shaking.  Sweating.  Chills.  Confusion.  Drink plenty of water while you exercise to prevent dehydration or heat stroke. Body water is lost during exercise and must be replaced.  Talk to your health care provider before starting an exercise program to make sure it is safe for you. Remember, almost any type of activity is better than none. Document Released: 04/26/2003 Document Revised: 06/20/2013 Document Reviewed: 07/13/2012 [employer] Patient Information 2015 [employer]. This information is not intended to replace advice given to you by your health care provider. Make sure you discuss any questions you have with your health care provider. Basic Carbohydrate Counting for Diabetes Mellitus Carbohydrate counting is a method for keeping track of the amount of carbohydrates you eat.   Eating carbohydrates naturally increases the level of sugar (glucose) in your blood, so it is important for you to know the amount that is  okay for you to have in every meal. Carbohydrate counting helps keep the level of glucose in your blood within normal limits. The amount of carbohydrates allowed is different for every person. A dietitian can help you calculate the amount that is right for you. Once you know the amount of carbohydrates you can have, you can count the carbohydrates in the foods you want to eat. Carbohydrates are found in the following foods:  Grains, such as breads and cereals.  Dried beans and soy products.  Starchy vegetables, such as potatoes, peas, and corn.  Fruit and fruit juices.  Milk and yogurt.  Sweets and snack foods, such as cake, cookies, candy, chips, soft drinks, and fruit drinks. CARBOHYDRATE COUNTING There are two ways to count the carbohydrates in your food. You can use either of the methods or a combination of both. Reading the "Nutrition Facts" on Packaged Food The "Nutrition Facts" is an area that is included on the labels of almost all packaged food and beverages in the [place]. It includes the serving size of that food or beverage and information about the nutrients in each serving of the food, including the grams (g) of carbohydrate per serving.  Decide the number of servings of this food or beverage that you will be able to eat or drink. Multiply that number of servings by the number of grams of carbohydrate that is listed on the label for that serving. The total will be the amount of carbohydrates you will be having when you eat or drink this food or beverage. Learning Standard Serving Sizes of Food When you eat food that is not packaged or does not include "Nutrition Facts" on the label, you need to measure the servings in order to count the amount of carbohydrates.A serving of most carbohydrate-rich foods contains about 15 g of carbohydrates. The following list includes serving sizes of carbohydrate-rich foods that provide 15 g ofcarbohydrate per serving:   1 slice of bread  (1 oz) or 1 six-inch tortilla.    of a hamburger bun or English muffin.  4-6 crackers.   cup unsweetened dry cereal.    cup hot cereal.   cup rice or pasta.    cup mashed potatoes or  of a large baked potato.  1 cup fresh fruit or one small piece of fruit.    cup canned or frozen fruit or fruit juice.  1 cup milk.   cup plain fat-free yogurt or yogurt sweetened with artificial sweeteners.   cup cooked dried beans or starchy vegetable, such as peas, corn, or potatoes.  Decide the number of standard-size servings that you will eat. Multiply that number of servings by 15 (the grams of carbohydrates in that serving). For example, if you eat 2 cups of strawberries, you will have eaten 2 servings and 30 g of carbohydrates (2 servings x 15 g = 30 g). For foods such as soups and casseroles, in which more than one food is mixed in, you will need to count the carbohydrates in each food that is included. EXAMPLE OF CARBOHYDRATE COUNTING Sample Dinner  3 oz chicken breast.   cup of brown rice.   cup of corn.  1 cup milk.   1 cup strawberries with sugar-free whipped topping.  Carbohydrate Calculation Step 1: Identify the foods that contain carbohydrates:   Rice.   Corn.     Milk.   Strawberries. Step 2:Calculate the number of servings eaten of each:   2 servings of rice.   1 serving of corn.   1 serving of milk.   1 serving of strawberries. Step 3: Multiply each of those number of servings by 15 g:   2 servings of rice x 15 g = 30 g.   1 serving of corn x 15 g = 15 g.   1 serving of milk x 15 g = 15 g.   1 serving of strawberries x 15 g = 15 g. Step 4: Add together all of the amounts to find the total grams of carbohydrates eaten: 30 g + 15 g + 15 g + 15 g = 75 g. Document Released: 02/03/2005 Document Revised: 06/20/2013 Document Reviewed: 12/31/2012 [facility] Patient Information 2015 [place], [place]. This information is not intended to  replace advice given to you by your health care provider. Make sure you discuss any questions you have with your health care provider. Asthma Asthma is a recurring condition in which the airways tighten and narrow. Asthma can make it difficult to breathe. It can cause coughing, wheezing, and shortness of breath. Asthma episodes, also called asthma attacks, range from minor to life-threatening. Asthma cannot be cured, but medicines and lifestyle changes can help control it. CAUSES Asthma is believed to be caused by inherited (genetic) and environmental factors, but its exact cause is unknown. Asthma may be triggered by allergens, lung infections, or irritants in the air. Asthma triggers are different for each person. Common triggers include:   Animal dander.  Dust mites.  Cockroaches.  Pollen from trees or grass.  Mold.  Smoke.  Air pollutants such as dust, household cleaners, hair sprays, aerosol sprays, paint fumes, strong chemicals, or strong odors.  Cold air, weather changes, and winds (which increase molds and pollens in the air).  Strong emotional expressions such as crying or laughing hard.  Stress.  Certain medicines (such as aspirin) or types of drugs (such as beta-blockers).  Sulfites in foods and drinks. Foods and drinks that may contain sulfites include dried fruit, potato chips, and sparkling grape juice.  Infections or inflammatory conditions such as the flu, a cold, or an inflammation of the nasal membranes (rhinitis).  Gastroesophageal reflux disease (GERD).  Exercise or strenuous activity. SYMPTOMS Symptoms may occur immediately after asthma is triggered or many hours later. Symptoms include:  Wheezing.  Excessive nighttime or early morning coughing.  Frequent or severe coughing with a common cold.  Chest tightness.  Shortness of breath. DIAGNOSIS  The diagnosis of asthma is made by a review of your medical history and a physical exam. Tests may also be  performed. These may include:  Lung function studies. These tests show how much air you breathe in and out.  Allergy tests.  Imaging tests such as X-rays. TREATMENT  Asthma cannot be cured, but it can usually be controlled. Treatment involves identifying and avoiding your asthma triggers. It also involves medicines. There are 2 classes of medicine used for asthma treatment:   Controller medicines. These prevent asthma symptoms from occurring. They are usually taken every day.  Reliever or rescue medicines. These quickly relieve asthma symptoms. They are used as needed and provide short-term relief. Your health care provider will help you create an asthma action plan. An asthma action plan is a written plan for managing and treating your asthma attacks. It includes a list of your asthma triggers and how they may be avoided. It also  includes information on when medicines should be taken and when their dosage should be changed. An action plan may also involve the use of a device called a peak flow meter. A peak flow meter measures how well the lungs are working. It helps you monitor your condition. HOME CARE INSTRUCTIONS   Take medicines only as directed by your health care provider. Speak with your health care provider if you have questions about how or when to take the medicines.  Use a peak flow meter as directed by your health care provider. Record and keep track of readings.  Understand and use the action plan to help minimize or stop an asthma attack without needing to seek medical care.  Control your home environment in the following ways to help prevent asthma attacks:  Do not smoke. Avoid being exposed to secondhand smoke.  Change your heating and air conditioning filter regularly.  Limit your use of fireplaces and wood stoves.  Get rid of pests (such as roaches and mice) and their droppings.  Throw away plants if you see mold on them.  Clean your floors and dust regularly.  Use unscented cleaning products.  Try to have someone else vacuum for you regularly. Stay out of rooms while they are being vacuumed and for a short while afterward. If you vacuum, use a dust mask from a hardware store, a double-layered or microfilter vacuum cleaner bag, or a vacuum cleaner with a HEPA filter.  Replace carpet with wood, tile, or vinyl flooring. Carpet can trap dander and dust.  Use allergy-proof pillows, mattress covers, and box spring covers.  Wash bed sheets and blankets every week in hot water and dry them in a dryer.  Use blankets that are made of polyester or cotton.  Clean bathrooms and kitchens with bleach. If possible, have someone repaint the walls in these rooms with mold-resistant paint. Keep out of the rooms that are being cleaned and painted.  Wash hands frequently. SEEK MEDICAL CARE IF:   You have wheezing, shortness of breath, or a cough even if taking medicine to prevent attacks.  The colored mucus you cough up (sputum) is thicker than usual.  Your sputum changes from clear or white to yellow, green, gray, or bloody.  You have any problems that may be related to the medicines you are taking (such as a rash, itching, swelling, or trouble breathing).  You are using a reliever medicine more than 2-3 times per week.  Your peak flow is still at 50-79% of your personal best after following your action plan for 1 hour.  You have a fever. SEEK IMMEDIATE MEDICAL CARE IF:   You seem to be getting worse and are unresponsive to treatment during an asthma attack.  You are short of breath even at rest.  You get short of breath when doing very little physical activity.  You have difficulty eating, drinking, or talking due to asthma symptoms.  You develop chest pain.  You develop a fast heartbeat.  You have a bluish color to your lips or fingernails.  You are light-headed, dizzy, or faint.  Your peak flow is less than 50% of your personal best. MAKE  SURE YOU:   Understand these instructions.  Will watch your condition.  Will get help right away if you are not doing well or get worse. Document Released: 02/03/2005 Document Revised: 06/20/2013 Document Reviewed: 09/02/2012 [facility] Patient Information 2015 [place], [place]. This information is not intended to replace advice given to you by your health  care provider. Make sure you discuss any questions you have with your health care provider.

## 2014-03-06 NOTE — Progress Notes (Signed)
Patient ID: Marisa [name], female   DOB: 1976-01-11, 39 y.o.   MRN: [id number]   Marisa [name], is a 39 y.o. female  [id number]  [id number]  DOB - 1976-02-02  Chief Complaint  Patient presents with  . Follow-up        Subjective:   Marisa [name] is a 39 y.o. female here today for a follow up visit. Patient has history of asthma, anxiety disorder, diabetes mellitus controlled, remote history of cardiac arrest during sharp delivery, recently seen in the hospital and admitted for chest pain and shortness of breath thought to be related to her asthma exacerbation. Patient was managed appropriately and discharged to be followed up in outpatient clinic. Patient is here for that follow-up today. She has no new complaints. No fever. Cough is less. Denies ongoing chest pain. Patient has had inhalers and nebulizer at home. Her last chest x-ray showed no acute abnormality. Her leukocytosis was said to be due to prednisone. She is accompanied today by her husband. Patient admits to loud snoring at night, she also says she does not sleep well and requesting sleep medication. Patient has No headache, No chest pain, No abdominal pain - No Nausea, No new weakness tingling or numbness  Problem  Osa (Obstructive Sleep Apnea)  Insomnia    ALLERGIES: Allergies  Allergen Reactions  . Penicillins Anaphylaxis  . Shellfish Allergy Anaphylaxis  . Other     Mushroom-- swelling throat, eyes Animals with fur -- swelling throat, eyes    PAST MEDICAL HISTORY: Past Medical History  Diagnosis Date  . Asthma   . EP (ectopic pregnancy)   . Ovarian tumor   . Tumor, thyroid   . Sleep apnea   . Chronic headaches   . Irregular heartbeat   . Chronic female pelvic pain 10/11/2012  . Abnormal uterine bleeding (AUB) 10/11/2012  . Cardiac arrest 1992    during delivery  . Shortness of breath   . Anemia   . Cancer     colon CA  . Chronic diarrhea   . Gait instability   . Frequent falls   .  Anxiety   . Panic attack   . IBS (irritable bowel syndrome)   . Type II or unspecified type diabetes mellitus without mention of complication, not stated as uncontrolled 10/13/2013    type 2    MEDICATIONS AT HOME: Prior to Admission medications   Medication Sig Start Date End Date Taking? Authorizing Provider  albuterol (PROVENTIL HFA;VENTOLIN HFA) 108 (90 BASE) MCG/ACT inhaler Inhale 1-2 puffs into the lungs every 6 (six) hours as needed for wheezing or shortness of breath. 02/23/14  Yes Robbie Lis, MD  ALPRAZolam Duanne Moron) 0.25 MG tablet Take 1 tablet (0.25 mg total) by mouth 2 (two) times daily as needed for anxiety. 03/04/14  Yes Modena Jansky, MD  glucose blood test strip Use as instructed 10/13/13  Yes Lance Bosch, NP  glucose monitoring kit (FREESTYLE) monitoring kit 1 each by Does not apply route as needed for other. 10/13/13  Yes Lance Bosch, NP  ipratropium-albuterol (DUONEB) 0.5-2.5 (3) MG/3ML SOLN Take 3 mLs by nebulization every 6 (six) hours as needed. 02/23/14  Yes Robbie Lis, MD  Lancets (FREESTYLE) lancets Use as instructed 10/13/13  Yes Lance Bosch, NP  metFORMIN (GLUCOPHAGE) 500 MG tablet Take 1 tablet (500 mg total) by mouth 2 (two) times daily with a meal. 10/10/13  Yes Lance Bosch, NP  nystatin (MYCOSTATIN) 100000 UNIT/ML suspension Take 5  mLs (500,000 Units total) by mouth 4 (four) times daily. 02/23/14  Yes Robbie Lis, MD  predniSONE (DELTASONE) 10 MG tablet Take 4 tabs daily 1 day, then 3 tabs daily 3 days, then 2 tabs daily 3 days, then 1 tab daily 3 days, then stop. 03/04/14  Yes Modena Jansky, MD  zolpidem (AMBIEN) 5 MG tablet Take 1 tablet (5 mg total) by mouth at bedtime as needed for sleep. 03/06/14  Yes Tresa Garter, MD  DULoxetine (CYMBALTA) 30 MG capsule Take 1 capsule (30 mg total) by mouth daily. 03/06/14   Tresa Garter, MD     Objective:   Filed Vitals:   03/06/14 1627  BP: 122/89  Pulse: 90  Temp: 98 F (36.7 C)    TempSrc: Oral  Resp: 16  SpO2: 95%    Exam General appearance : Awake, alert, not in any distress. Speech Clear. Not toxic looking, morbidly obese HEENT: Atraumatic and Normocephalic, pupils equally reactive to light and accomodation Neck: supple, no JVD. No cervical lymphadenopathy.  Chest:Good air entry bilaterally, no added sounds  CVS: S1 S2 regular, no murmurs.  Abdomen: Bowel sounds present, Non tender and not distended with no gaurding, rigidity or rebound. Extremities: B/L Lower Ext shows no edema, both legs are warm to touch Neurology: Awake alert, and oriented X 3, CN II-XII intact, Non focal Skin:No Rash Wounds:N/A  Data Review Lab Results  Component Value Date   HGBA1C 6.2* 02/20/2014   HGBA1C 6.5* 09/29/2013     Assessment & Plan   1. OSA (obstructive sleep apnea)  - Split night study; Future  2. Insomnia  - zolpidem (AMBIEN) 5 MG tablet; Take 1 tablet (5 mg total) by mouth at bedtime as needed for sleep.  Dispense: 30 tablet; Refill: 0 - Patient has been informed that this medication will only be prescribed on a short-term basis, long-term use is highly discouraged and if needed patient will need a psychiatry approval and prescription. These clinic will not refill Ambien for insomnia. Patient has been ordered a sleep study.  3. Depression We'll start patient on antidepressant  - DULoxetine (CYMBALTA) 30 MG capsule; Take 1 capsule (30 mg total) by mouth daily.  Dispense: 30 capsule; Refill: 3   Patient was counseled extensively about nutrition and exercise   Return in about 3 months (around 06/05/2014) for Hemoglobin A1C and Follow up, DM, Follow up Pain and comorbidities.  The patient was given clear instructions to go to ER or return to medical center if symptoms don't improve, worsen or new problems develop. The patient verbalized understanding. The patient was told to call to get lab results if they haven't heard anything in the next week.   This  note has been created with [employer], quantity. Any transcriptional errors are unintentional.    Angelica Chessman, MD, [place], [place], [place] and [place], [place]   03/06/2014, 5:05 PM

## 2014-03-06 NOTE — Progress Notes (Signed)
[facility] Pt is following up on her Dyspnea. Pt is here to manage her diabetes and anxiety.

## 2014-03-07 ENCOUNTER — Other Ambulatory Visit: Payer: Self-pay

## 2014-03-07 ENCOUNTER — Telehealth: Payer: Self-pay

## 2014-03-07 LAB — CULTURE, BLOOD (ROUTINE X 2)
Culture: NO GROWTH
Culture: NO GROWTH

## 2014-03-07 NOTE — Telephone Encounter (Signed)
Spoke with Waunita Schooner at the sleep center Patient is scheduled for her sleep study Monday Feb 15 @ 8pm Patient is aware and is to report to the [place] next to [facility]

## 2014-03-08 ENCOUNTER — Telehealth: Payer: Self-pay | Admitting: General Practice

## 2014-03-08 NOTE — Telephone Encounter (Signed)
Patient calling to speak to Provider/Nurse requesting to have a letter sent to her employer stating that it is ok for her to return to work on 04/07/14. Patient was in for [place] on 03/06/14 and states that she spoke with nurse and provider about this at the time of her visit. Patient requesting the letter to be faxed to  [employer] [phone]. Please assist.

## 2014-03-13 ENCOUNTER — Telehealth: Payer: Self-pay | Admitting: Internal Medicine

## 2014-03-13 NOTE — Telephone Encounter (Signed)
Patient called to request a letter for her to go back to work and to also be able to take her breathing treatments at work. Patient would like letter to be faxed to [employer] [phone]. Patient needs the letter to be faxed over as soon as possible. Please f/u with patient for any other questions.

## 2014-03-14 ENCOUNTER — Inpatient Hospital Stay: Payer: 59 | Admitting: Adult Health

## 2014-03-17 ENCOUNTER — Telehealth: Payer: Self-pay | Admitting: Internal Medicine

## 2014-03-17 ENCOUNTER — Telehealth: Payer: Self-pay | Admitting: Emergency Medicine

## 2014-03-17 NOTE — Telephone Encounter (Signed)
Pt calling to follow up on letter she needs sent to her employer. Please f/u with pt.

## 2014-03-17 NOTE — Telephone Encounter (Signed)
Patient called back to check on the request for a letter to go back to work and to also be able to take her breathing treatments at work. Patient would like letter to be faxed to [employer] [phone]. Patient needs the letter to be faxed over as soon as possible. Please f/u with patient for any other questions.

## 2014-03-17 NOTE — Telephone Encounter (Signed)
Patient called back to check on the request for a letter to go back to work and to also be able to take her breathing treatments at work. Patient would like letter to be faxed to [employer] [phone]. Patient needs the letter to be faxed over as soon as possible. Please f/u with patient for any other questions.     Please f/u

## 2014-03-20 ENCOUNTER — Ambulatory Visit (HOSPITAL_BASED_OUTPATIENT_CLINIC_OR_DEPARTMENT_OTHER): Payer: 59 | Attending: Internal Medicine | Admitting: Radiology

## 2014-03-20 ENCOUNTER — Telehealth: Payer: Self-pay | Admitting: Emergency Medicine

## 2014-03-20 VITALS — Ht 65.0 in | Wt 230.0 lb

## 2014-03-20 DIAGNOSIS — G4733 Obstructive sleep apnea (adult) (pediatric): Secondary | ICD-10-CM | POA: Diagnosis present

## 2014-03-20 NOTE — Telephone Encounter (Signed)
After speaking with Dr. Doreene Burke, pt will need to schedule f/u office visit to determine mobility capacity to return to work 2/16 Left VM for pt to schedule appt

## 2014-03-21 ENCOUNTER — Inpatient Hospital Stay: Payer: 59 | Admitting: Adult Health

## 2014-03-24 ENCOUNTER — Telehealth: Payer: Self-pay | Admitting: Emergency Medicine

## 2014-03-24 NOTE — Telephone Encounter (Signed)
Pt called in requesting letter to return to work 04/04/14 due to lack of mobility s/p HFU Asthma Exacerbation  After speaking with Dr. Doreene Burke, pt can come Monday during walk in @ 4pm for revaluation.

## 2014-03-27 ENCOUNTER — Encounter: Payer: Self-pay | Admitting: Internal Medicine

## 2014-03-27 ENCOUNTER — Ambulatory Visit: Payer: 59 | Attending: Internal Medicine | Admitting: Internal Medicine

## 2014-03-27 VITALS — BP 105/75 | HR 92 | Temp 98.2°F | Resp 16 | Ht 65.0 in | Wt 248.0 lb

## 2014-03-27 DIAGNOSIS — R32 Unspecified urinary incontinence: Secondary | ICD-10-CM | POA: Insufficient documentation

## 2014-03-27 DIAGNOSIS — R2681 Unsteadiness on feet: Secondary | ICD-10-CM | POA: Insufficient documentation

## 2014-03-27 DIAGNOSIS — J45909 Unspecified asthma, uncomplicated: Secondary | ICD-10-CM | POA: Insufficient documentation

## 2014-03-27 DIAGNOSIS — R531 Weakness: Secondary | ICD-10-CM | POA: Insufficient documentation

## 2014-03-27 DIAGNOSIS — E119 Type 2 diabetes mellitus without complications: Secondary | ICD-10-CM | POA: Diagnosis not present

## 2014-03-27 DIAGNOSIS — R296 Repeated falls: Secondary | ICD-10-CM

## 2014-03-27 DIAGNOSIS — Z9181 History of falling: Secondary | ICD-10-CM | POA: Diagnosis not present

## 2014-03-27 DIAGNOSIS — J449 Chronic obstructive pulmonary disease, unspecified: Secondary | ICD-10-CM | POA: Diagnosis not present

## 2014-03-27 DIAGNOSIS — R202 Paresthesia of skin: Secondary | ICD-10-CM | POA: Diagnosis not present

## 2014-03-27 DIAGNOSIS — F329 Major depressive disorder, single episode, unspecified: Secondary | ICD-10-CM | POA: Diagnosis not present

## 2014-03-27 DIAGNOSIS — Z8674 Personal history of sudden cardiac arrest: Secondary | ICD-10-CM | POA: Insufficient documentation

## 2014-03-27 DIAGNOSIS — F32A Depression, unspecified: Secondary | ICD-10-CM

## 2014-03-27 DIAGNOSIS — F419 Anxiety disorder, unspecified: Secondary | ICD-10-CM | POA: Insufficient documentation

## 2014-03-27 DIAGNOSIS — W19XXXA Unspecified fall, initial encounter: Secondary | ICD-10-CM

## 2014-03-27 HISTORY — DX: Depression, unspecified: F32.A

## 2014-03-27 HISTORY — DX: Unspecified fall, initial encounter: W19.XXXA

## 2014-03-27 HISTORY — DX: Repeated falls: R29.6

## 2014-03-27 MED ORDER — SUMATRIPTAN SUCCINATE 50 MG PO TABS: 50.0000 mg | ORAL_TABLET | Freq: Once | ORAL | Status: DC

## 2014-03-27 NOTE — Progress Notes (Signed)
Pt comes in for re- evaluation of physical mobility in order to return back to work Pt is requesting to return back to work 04/04/2014 States s/p hospital admission COPD exacerbation, her physical mobility has declines States she is experiencing freq falls at home. States she fell on back after legs buckled Asthma has resolved C/o migraine headache Requesting PT/Psychiatry referral W/c assist

## 2014-03-27 NOTE — Progress Notes (Signed)
Patient ID: Marisa [name], female   DOB: 1975-05-04, 39 y.o.   MRN: [id number]   Marisa [name], is a 39 y.o. female  ELY:[id number]  [id number]  DOB - [birth year]  Chief Complaint  Patient presents with  . Follow-up    Evaluation of physical mobility/ return to work note        Subjective:   Marisa [name] is a 39 y.o. female here today for a follow up visit.patient has history of asthma, anxiety disorder, diabetes mellitus controlled, remote history of cardiac arrest during delivery recently hospitalized for chest pain and COPD/asthma exacerbation. Patient has since been declined in physical activity, she is here today to request days off from work so as to attend rehabilitation sessions. She is now wheelchair. She wants a re- evaluation of physical mobility in order to return back to work on not. Pt is requesting to return back to work 04/04/2014. States she is experiencing freq falls at home. States she fell on back after legs buckled. Requesting PT/Psychiatry referral. She is ambulating with a wheelchair since she has no strength in her legs. She said she has experienced urinary incontinence also since leaving the hospital. Patient has No chest pain, No abdominal pain - No Nausea, No Cough - SOB.  Problem  Depression  Falls    ALLERGIES: Allergies  Allergen Reactions  . Penicillins Anaphylaxis  . Shellfish Allergy Anaphylaxis  . Other     Mushroom-- swelling throat, eyes Animals with fur -- swelling throat, eyes    PAST MEDICAL HISTORY: Past Medical History  Diagnosis Date  . Asthma   . EP (ectopic pregnancy)   . Ovarian tumor   . Tumor, thyroid   . Sleep apnea   . Chronic headaches   . Irregular heartbeat   . Chronic female pelvic pain 10/11/2012  . Abnormal uterine bleeding (AUB) 10/11/2012  . Cardiac arrest 1992    during delivery  . Shortness of breath   . Anemia   . Cancer     colon CA  . Chronic diarrhea   . Gait instability   . Frequent falls   .  Anxiety   . Panic attack   . IBS (irritable bowel syndrome)   . Type II or unspecified type diabetes mellitus without mention of complication, not stated as uncontrolled 10/13/2013    type 2    MEDICATIONS AT HOME: Prior to Admission medications   Medication Sig Start Date End Date Taking? Authorizing Provider  albuterol (PROVENTIL HFA;VENTOLIN HFA) 108 (90 BASE) MCG/ACT inhaler Inhale 1-2 puffs into the lungs every 6 (six) hours as needed for wheezing or shortness of breath. 02/23/14  Yes Robbie Lis, MD  DULoxetine (CYMBALTA) 30 MG capsule Take 1 capsule (30 mg total) by mouth daily. 03/06/14  Yes Tresa Garter, MD  ipratropium-albuterol (DUONEB) 0.5-2.5 (3) MG/3ML SOLN Take 3 mLs by nebulization every 6 (six) hours as needed. 02/23/14  Yes Robbie Lis, MD  metFORMIN (GLUCOPHAGE) 500 MG tablet Take 1 tablet (500 mg total) by mouth 2 (two) times daily with a meal. 10/10/13  Yes Lance Bosch, NP  ALPRAZolam Duanne Moron) 0.25 MG tablet Take 1 tablet (0.25 mg total) by mouth 2 (two) times daily as needed for anxiety. Patient not taking: Reported on 03/27/2014 03/04/14   Modena Jansky, MD  glucose blood test strip Use as instructed 10/13/13   Lance Bosch, NP  glucose monitoring kit (FREESTYLE) monitoring kit 1 each by Does not apply route as needed for  other. 10/13/13   Lance Bosch, NP  Lancets (FREESTYLE) lancets Use as instructed 10/13/13   Lance Bosch, NP  nystatin (MYCOSTATIN) 100000 UNIT/ML suspension Take 5 mLs (500,000 Units total) by mouth 4 (four) times daily. Patient not taking: Reported on 03/27/2014 02/23/14   Robbie Lis, MD  predniSONE (DELTASONE) 10 MG tablet Take 4 tabs daily 1 day, then 3 tabs daily 3 days, then 2 tabs daily 3 days, then 1 tab daily 3 days, then stop. Patient not taking: Reported on 03/27/2014 03/04/14   Modena Jansky, MD  zolpidem (AMBIEN) 5 MG tablet Take 1 tablet (5 mg total) by mouth at bedtime as needed for sleep. Patient not taking: Reported on  03/27/2014 03/06/14   Tresa Garter, MD     Objective:   Filed Vitals:   03/27/14 1702  BP: 105/75  Pulse: 92  Temp: 98.2 F (36.8 C)  TempSrc: Oral  Resp: 16  Height: '5\' 5"'  (1.651 m)  Weight: 248 lb (112.492 kg)  SpO2: 98%    Exam General appearance : Awake, alert, not in any distress. Speech Clear. Not toxic looking, or wheelchair HEENT: Atraumatic and Normocephalic, pupils equally reactive to light and accomodation Neck: supple, no JVD. No cervical lymphadenopathy.  Chest:Good air entry bilaterally, no added sounds  CVS: S1 S2 regular, no murmurs.  Abdomen: Bowel sounds present, Non tender and not distended with no gaurding, rigidity or rebound. Extremities: Demonstrates wobbling gait during attempts to ambulate, patient at risks of falling Neurology: Awake alert, and oriented X 3, CN II-XII intact, Non focal Skin:No Rash Wounds:N/A  Data Review Lab Results  Component Value Date   HGBA1C 6.2* 02/20/2014   HGBA1C 6.5* 09/29/2013     Assessment & Plan   1. Depression Continue Cymbalta Patient counseled Ambulatory referral to psychiatry, patient was given the option of walk-in to [facility], initial encounter  - AMB referral to rehabilitation - MR Lumbar Spine W Wo Contrast; Future: Ordered because of new weakness and tingling in the lower limbs and urinary incontinence as complained by patient - For home use only DME 4 wheeled rolling walker with seat ([id number])  Return in about 3 months (around 06/25/2014), or if symptoms worsen or fail to improve, for Follow up HTN.  The patient was given clear instructions to go to ER or return to medical center if symptoms don't improve, worsen or new problems develop. The patient verbalized understanding. The patient was told to call to get lab results if they haven't heard anything in the next week.   This note has been created with Surveyor, quantity. Any  transcriptional errors are unintentional.    Angelica Chessman, MD, [place], [place], [place] and [place], [place]   03/27/2014, 5:21 PM

## 2014-03-28 ENCOUNTER — Telehealth: Payer: Self-pay | Admitting: Emergency Medicine

## 2014-03-28 NOTE — Telephone Encounter (Signed)
Pt given scheduled MRI Lumbar appt @ [facility] 04/06/14 with 645 pm arrival time Pt provided number for cancellation. Verbalized understanding

## 2014-03-31 ENCOUNTER — Other Ambulatory Visit (INDEPENDENT_AMBULATORY_CARE_PROVIDER_SITE_OTHER): Payer: Self-pay | Admitting: General Surgery

## 2014-03-31 NOTE — H&P (Signed)
Marisa [name] Patient #: [id number] DOB: 08/11/1975 (39 years)   Date of Encounter: 03/31/2014    History of Present Illness   Patient words: fu anal fissure.  The patient is a 39 year old female who presents with anal pain. This is a 39-year-old female who has been here in the office in the past for an anal fissure. She returns for follow-up. She has tried multiple courses of nitroglycerin and diltiazem ointments. She continues to have pain and anal bleeding with bowel movements. She is recently in the hospital for pneumonia. She was noted to have rectal bleeding at that time. A flexible sigmoidoscopy was performed and showed no other signs of bleeding. She is using a fiber supplement and having soft bowel movements without straining.       Allergies   Penicillins [Drug allergy]  SHELLFISH    Family History   Migraine Headache: Father  Hypertension: Father, Mother  Heart disease in female family member before age 65  Thyroid problems: Mother  Seizure disorder: Son  Rectal Cancer: Father  Heart Disease: Mother  Bleeding disorder: Mother  Arthritis: Father, Mother  Alcohol Abuse: Brother, Sister  Depression: Brother, Sister, Son  Colon Polyps: Family Members In General  Colon Cancer: Father    Social History   No drug use  Tobacco use: Current some day smoker  Alcohol use: Remotely quit alcohol use  Caffeine use: Coffee    Medication History   MetFORMIN HCl (500MG Tablet, Oral) Active.  TRUEplus Lancets 28G Active.  TRUEtest Test ( In Vitro) Active.  TRUEresult Blood Glucose (w/Device Kit,) Active.  Albuterol Sulfate HFA (108 (90 Base)MCG/ACT Aerosol Soln, Inhalation) Active.  Cymbalta (30MG Capsule DR Part, Oral) Active.  DuoNeb (0.5-2.5 (3)MG/3ML Solution, Inhalation) Active.  Imitrex (50MG Tablet, Oral) Active.  Dicyclomine HCl (20MG Tablet, Oral) Active.    Other Problems   Gastroesophageal Reflux Disease  Gastric Ulcer  Migraine Headache  ANAL FISSURE (565.0   K60.2) ; This is a 39-year-old female who has been treated for an anal fissure. She has used short course doses of several ointments including nitroglycerin.  ANAL PAIN (569.42  K62.89)   Asthma  Anxiety Disorder  Back Pain  Diabetes Mellitus  Bladder Problems    Past Surgical   Hysterectomy (not due to cancer) - Partial  Resection of Stomach  Gallbladder Surgery - Laparoscopic  Cesarean Section - 1  Colon Polyp Removal - Colonoscopy     Vitals   03/31/2014 09:11 AM   Weight: 253 lb, 2 oz Height: 65 in   Body Surface Area: 2.29 m Body Mass Index: 42.12 kg/m     Temp.: 98.7 F (Temporal) Pulse: 64 (Regular) Resp.: 18 (Unlabored)   BP: 118/82 Manual (Sitting, Left Arm, Standard)     Physical Exam  The physical exam findings are as follows:  General Mental Status - Alert.  General Appearance - Consistent with stated age.  Hydration - Well hydrated.  Voice - Normal.    Head and Neck Head - normocephalic, atraumatic with no lesions or palpable masses.  Trachea - midline.  Thyroid - Gland Characteristics - normal size and consistency.    Eye Eyeball - Bilateral - Extraocular movements intact.  Sclera/Conjunctiva - Bilateral - No scleral icterus.    Chest and Lung Exam Chest and lung exam reveals - quiet, even and easy respiratory effort with no use of accessory muscles and on auscultation, normal breath sounds, no adventitious sounds and normal vocal resonance.  Inspection - Chest Wall -   Normal. Back - normal.    Cardiovascular Cardiovascular examination reveals - normal heart sounds, regular rate and rhythm with no murmurs and normal pedal pulses bilaterally.    Abdomen Inspection - Inspection of the abdomen reveals - No Hernias. Skin - Scar - no surgical scars.  Palpation/Percussion - Palpation and Percussion of the abdomen reveal - Soft, Non Tender, No Rebound tenderness, No Rigidity (guarding) and No hepatosplenomegaly.  Auscultation - Auscultation of the abdomen  reveals - Bowel sounds normal.    Rectal Anorectal Exam - External - Note: Small shallow fissure noted posteriorly. Sphincter hypertension present. Possible fistula noted.    Neurologic Neurologic evaluation reveals - alert and oriented x 3 with no impairment of recent or remote memory.  Mental Status - Normal.     Assessment & Plan  ANAL PAIN (569.42  K62.89)  Problem Story: This is a 39-year-old female who reports to the office with continued anal pain with bowel movements. She has failed medical management and is requesting further therapy. We discussed the role of chemicals being durotomy versus lateral internal sphincterotomy. She understands that there is a 5% risk of incontinence with lateral internal durotomy. She is decided to proceed with this choice if needed. I have recommended that she undergo an exam under anesthesia to further evaluate the source of her anal pain. I am not completely certain that she has a fissure. She may have a fistula in the posterior midline. In that case we would do a fistulotomy if needed.    

## 2014-04-01 DIAGNOSIS — G4733 Obstructive sleep apnea (adult) (pediatric): Secondary | ICD-10-CM

## 2014-04-01 NOTE — Sleep Study (Signed)
   NAME: Marisa [name] DATE OF BIRTH:  January 07, 1976 MEDICAL RECORD NUMBER [id number]  LOCATION: [facility]  PHYSICIAN: YOUNG,CLINTON D  DATE OF STUDY: 03/20/2014  SLEEP STUDY TYPE: Nocturnal Polysomnogram               REFERRING PHYSICIAN: Tresa Garter, MD  INDICATION FOR STUDY: Hypersomnia with sleep apnea  EPWORTH SLEEPINESS SCORE:   6/24 HEIGHT: 5\' 5"  (165.1 cm)  WEIGHT: 230 lb (104.327 kg)    Body mass index is 38.27 kg/(m^2).  NECK SIZE: 15.5 in.  MEDICATIONS: Charted for review  SLEEP ARCHITECTURE: Total sleep time 347 minutes with sleep efficiency 88.9%. Stage I was 8.4%, stage II 81.4%, stage III 1.6%, REM 8.6% of total sleep time. Sleep latency 34 minutes, REM latency 215 minutes, awake after sleep onset 10 minutes, arousal index 38.2, bedtime medication: None  RESPIRATORY DATA: Apnea hypopnea index (AHI) 13.1 per hour. 76 total events scored including 3 obstructive apneas and 73 hypopneas. Most events were while nonsupine. REM AHI 14 per hour. There were not enough early events to permit application of split CPAP titration.  OXYGEN DATA: Moderate snoring with oxygen desaturation to a nadir of 87% and mean saturation 94.4% on room air  CARDIAC DATA: Normal sinus rhythm  MOVEMENT/PARASOMNIA: Repeated episodes of bruxism. No limb movement disturbance. No bathroom trips.  IMPRESSION/ RECOMMENDATION:   1) Mild obstructive sleep apnea/hypopnea syndrome, AHI 13.1 per hour with nonsupine events. REM AHI 14.0 per hour. Moderate snoring with oxygen desaturation to a nadir of 87% and mean saturation 94.4% on room air 2) There were not enough early events to permit application of split CPAP titration by protocol on this study night. If appropriate, the patient can return for dedicated CPAP titration study. 3) Several episodes of bruxism were noted   Deneise Lever Diplomate, American Board of Sleep Medicine  ELECTRONICALLY SIGNED ON:  04/01/2014,  9:04 AM [place] PH: [phone]   FX: [phone] [place]

## 2014-04-03 ENCOUNTER — Inpatient Hospital Stay: Payer: 59 | Admitting: Adult Health

## 2014-04-03 ENCOUNTER — Ambulatory Visit: Payer: 59 | Admitting: Internal Medicine

## 2014-04-06 ENCOUNTER — Ambulatory Visit (HOSPITAL_COMMUNITY)
Admission: RE | Admit: 2014-04-06 | Discharge: 2014-04-06 | Disposition: A | Discharge status: Home / Self Care | Payer: 59 | Source: Ambulatory Visit | Attending: Internal Medicine | Admitting: Internal Medicine

## 2014-04-06 ENCOUNTER — Encounter: Payer: Self-pay | Admitting: Family Medicine

## 2014-04-06 DIAGNOSIS — R262 Difficulty in walking, not elsewhere classified: Secondary | ICD-10-CM | POA: Insufficient documentation

## 2014-04-06 DIAGNOSIS — W19XXXA Unspecified fall, initial encounter: Secondary | ICD-10-CM

## 2014-04-06 DIAGNOSIS — M5126 Other intervertebral disc displacement, lumbar region: Secondary | ICD-10-CM | POA: Insufficient documentation

## 2014-04-06 DIAGNOSIS — R531 Weakness: Secondary | ICD-10-CM | POA: Diagnosis present

## 2014-04-06 MED ORDER — GADOBENATE DIMEGLUMINE 529 MG/ML IV SOLN
20.0000 mL | Freq: Once | INTRAVENOUS | Status: AC | PRN
  Administered 2014-04-06: 20 mL via INTRAVENOUS

## 2014-04-11 ENCOUNTER — Encounter (HOSPITAL_BASED_OUTPATIENT_CLINIC_OR_DEPARTMENT_OTHER): Payer: 59

## 2014-04-11 ENCOUNTER — Encounter: Payer: Self-pay | Admitting: Adult Health

## 2014-04-11 ENCOUNTER — Ambulatory Visit (INDEPENDENT_AMBULATORY_CARE_PROVIDER_SITE_OTHER): Payer: 59 | Admitting: Adult Health

## 2014-04-11 VITALS — BP 124/72 | HR 78 | Temp 97.5°F | Ht 65.0 in | Wt 251.6 lb

## 2014-04-11 DIAGNOSIS — R269 Unspecified abnormalities of gait and mobility: Secondary | ICD-10-CM

## 2014-04-11 DIAGNOSIS — J45901 Unspecified asthma with (acute) exacerbation: Secondary | ICD-10-CM

## 2014-04-11 HISTORY — DX: Unspecified abnormalities of gait and mobility: R26.9

## 2014-04-11 NOTE — Assessment & Plan Note (Signed)
Recent exacerbation with hospitalization  Now improved  MCT from 08/2012 + asthma  Will start Symbicort  Needs PFT on return  Smoking cessation   Plan  Great job on not smoking  Begin Symbicort 160/4.64mcg 2 puffs Twice daily  , rinses after use.  May use DuoNeb As needed  Wheezing  Follow up Dr. Gwenette Greet in 4-6 weeks with PFT  Please contact office for sooner follow up if symptoms do not improve or worsen or seek emergency care  You need to see your family doctor immediately for your ongoing leg weakness and trouble walking .

## 2014-04-11 NOTE — Assessment & Plan Note (Signed)
Gait disturbance w/ LE weakness -acute onset  ? Etiology -recent viral illness ?Marisa [name] , steroid myopathy ?? Recent MRI LS w/ no acute process noted  ? Need neuro referral  Recommend ASAP PCP appointment for evaluation if unable to get ov will need neuro referral

## 2014-04-11 NOTE — Patient Instructions (Addendum)
Great job on not smoking  Begin Symbicort 160/4.67mcg 2 puffs Twice daily  , rinses after use.  May use DuoNeb As needed  Wheezing  Follow up Dr. Gwenette Greet in 4-6 weeks with PFT  Please contact office for sooner follow up if symptoms do not improve or worsen or seek emergency care  You need to see your family doctor immediately for your ongoing leg weakness and trouble walking .

## 2014-04-11 NOTE — Progress Notes (Signed)
Subjective:  39 year old female , smoker seen in 2014 for evaluation for asthma.  04/11/2014 post hospital follow-up Patient returns for a post hospital follow-up. Patient was admitted January 12 of January 16 for acute asthma exacerbation Viral panel was positive for rhinovirus. Blood cultures were negative She was treated with antibiotics, steroids, nebulized bronchodilators. Chest x-ray showed no sign of acute pneumonia. She was discharged on a prednisone taper. Patient was seen in our office in 2014 for an asthma evaluation Spirometry at that time showed no obstruction. Patient was recommended to follow back in our office. However did not keep her follow-up. Methacholine challenge test was done 09/13/2012, this was positive. Since discharge. Patient reports that her breathing has improved. She has decreased cough, congestion and wheezing. She is currently using albuterol inhaler and DuoNeb nebulizer as needed She has quit smoking. Unfortunately, patient says that she has developed lower extremity weakness and difficulty walking since hospitalization. She noticed that sometime around her hospitalization in January that her lower extremities were becoming weak and it was becoming difficult for her to walk and having frequent falls. Patient was seen by her primary care physician and set up for an MRI of her lumbar spine. Chart review shows mild lateral bulging along L4 and L5 without focal stenosis, essentially nothing to explain her new onset symptoms. Patient denies any difficulty breathing, swallowing, visual changes, headaches. Does complain of some low back pain. She has noticed some intermittent urinary urgency, frequency and intermittent episodes of incontinence since discharge. Has to use wheelchair now.  In December 2015 w/ no gait issue or extremity weakness.      ROS  Constitutional:   No  weight loss, night sweats,  Fevers, chills,  +fatigue, or  lassitude.  HEENT:   No  headaches,  Difficulty swallowing,  Tooth/dental problems, or  Sore throat,                No sneezing, itching, ear ache,  +nasal congestion, post nasal drip,   CV:  No chest pain,  Orthopnea, PND, swelling in lower extremities, anasarca, dizziness, palpitations, syncope.   GI  No heartburn, indigestion, abdominal pain, nausea, vomiting, diarrhea, change in bowel habits, loss of appetite, bloody stools.   Resp: No chest wall deformity  Skin: no rash or lesions.  GU: no dysuria, change in color of urine, + urgency or frequency.  No flank pain, no hematuria   MS:  +trouble walking, LE weakness   Psych:  No change in mood or affect. No depression or anxiety.  No memory loss.             Objective:   Physical Exam Constitutional:  Obese female, no acute distress  HENT:  Nares patent without discharge, large turbinates  Oropharynx without exudate, palate and uvula thick and elongated.   Eyes:  Perrla, eomi, no scleral icterus  Neck:  No JVD, no TMG  Cardiovascular:  Normal rate, regular rhythm, no rubs or gallops.  No murmurs        Intact distal pulses  Pulmonary :  Normal breath sounds, no stridor or respiratory distress   No rales, rhonchi, or wheezing  Abdominal:  Soft, nondistended, bowel sounds present.  No tenderness noted.   Musculoskeletal:  No lower extremity edema noted.  Lymph Nodes:  No cervical lymphadenopathy noted  Skin:  No cyanosis noted  Neurologic:  Alert, appropriate, DTR 2+, gait abnormality , lower extremity weakness 2-3/5 , spaciticity of LE w/ extension.  No tongue or muscle  fasciulations noted, tongue midline PERRLA, EOMI . nml grips.          Assessment & Plan:

## 2014-04-13 ENCOUNTER — Telehealth: Payer: Self-pay | Admitting: Emergency Medicine

## 2014-04-13 NOTE — Telephone Encounter (Signed)
-----   Message from Tresa Garter, MD sent at 04/12/2014  5:55 PM EST ----- Please inform patient that her lumbar spine MRI shows no significant focal stenosis or narrowing. We will refer her to neurologist if symptoms persists.  Please place a referral to neurologist if patient continues to have bilateral lower limb weakness and tingling as previously experienced.

## 2014-04-13 NOTE — Telephone Encounter (Signed)
Left message for pt to call clinic for MRI results Will place neurology referral once I f/u with pt sx's

## 2014-04-14 ENCOUNTER — Telehealth: Payer: Self-pay | Admitting: Pulmonary Disease

## 2014-04-14 NOTE — Telephone Encounter (Signed)
lmtcb x1 

## 2014-04-16 ENCOUNTER — Encounter (HOSPITAL_COMMUNITY): Payer: Self-pay | Admitting: Emergency Medicine

## 2014-04-16 ENCOUNTER — Emergency Department (HOSPITAL_COMMUNITY)
Admission: EM | Admit: 2014-04-16 | Discharge: 2014-04-16 | Disposition: A | Discharge status: Home / Self Care | Payer: 59 | Attending: Emergency Medicine | Admitting: Emergency Medicine

## 2014-04-16 DIAGNOSIS — R000 Tachycardia, unspecified: Secondary | ICD-10-CM | POA: Diagnosis not present

## 2014-04-16 DIAGNOSIS — F4320 Adjustment disorder, unspecified: Secondary | ICD-10-CM | POA: Diagnosis not present

## 2014-04-16 DIAGNOSIS — Z862 Personal history of diseases of the blood and blood-forming organs and certain disorders involving the immune mechanism: Secondary | ICD-10-CM | POA: Diagnosis not present

## 2014-04-16 DIAGNOSIS — Z87891 Personal history of nicotine dependence: Secondary | ICD-10-CM | POA: Diagnosis not present

## 2014-04-16 DIAGNOSIS — R109 Unspecified abdominal pain: Secondary | ICD-10-CM | POA: Insufficient documentation

## 2014-04-16 DIAGNOSIS — E119 Type 2 diabetes mellitus without complications: Secondary | ICD-10-CM | POA: Insufficient documentation

## 2014-04-16 DIAGNOSIS — J45909 Unspecified asthma, uncomplicated: Secondary | ICD-10-CM | POA: Diagnosis not present

## 2014-04-16 DIAGNOSIS — Z8669 Personal history of other diseases of the nervous system and sense organs: Secondary | ICD-10-CM | POA: Diagnosis not present

## 2014-04-16 DIAGNOSIS — R51 Headache: Secondary | ICD-10-CM | POA: Insufficient documentation

## 2014-04-16 DIAGNOSIS — Z880 Allergy status to penicillin: Secondary | ICD-10-CM | POA: Diagnosis not present

## 2014-04-16 DIAGNOSIS — Z87448 Personal history of other diseases of urinary system: Secondary | ICD-10-CM | POA: Insufficient documentation

## 2014-04-16 DIAGNOSIS — Z9181 History of falling: Secondary | ICD-10-CM | POA: Insufficient documentation

## 2014-04-16 DIAGNOSIS — F4321 Adjustment disorder with depressed mood: Secondary | ICD-10-CM

## 2014-04-16 DIAGNOSIS — Z85038 Personal history of other malignant neoplasm of large intestine: Secondary | ICD-10-CM | POA: Insufficient documentation

## 2014-04-16 DIAGNOSIS — F419 Anxiety disorder, unspecified: Secondary | ICD-10-CM | POA: Diagnosis present

## 2014-04-16 DIAGNOSIS — Z86010 Personal history of colonic polyps: Secondary | ICD-10-CM | POA: Diagnosis not present

## 2014-04-16 LAB — URINE MICROSCOPIC-ADD ON

## 2014-04-16 LAB — COMPREHENSIVE METABOLIC PANEL
ALT: 63 U/L — ABNORMAL HIGH (ref 0–35)
AST: 39 U/L — ABNORMAL HIGH (ref 0–37)
Albumin: 3.6 g/dL (ref 3.5–5.2)
Alkaline Phosphatase: 59 U/L (ref 39–117)
Anion gap: 9 (ref 5–15)
BUN: 7 mg/dL (ref 6–23)
CO2: 23 mmol/L (ref 19–32)
Calcium: 9.2 mg/dL (ref 8.4–10.5)
Chloride: 105 mmol/L (ref 96–112)
Creatinine, Ser: 0.56 mg/dL (ref 0.50–1.10)
GFR calc Af Amer: >90 mL/min (ref >90)
GFR calc non Af Amer: >90 mL/min (ref >90)
Glucose, Bld: 118 mg/dL — ABNORMAL HIGH (ref 70–99)
Potassium: 4 mmol/L (ref 3.5–5.1)
Sodium: 137 mmol/L (ref 135–145)
Total Bilirubin: 0.6 mg/dL (ref 0.3–1.2)
Total Protein: 6.7 g/dL (ref 6.0–8.3)

## 2014-04-16 LAB — URINALYSIS, ROUTINE W REFLEX MICROSCOPIC
Bilirubin Urine: NEGATIVE
Glucose, UA: NEGATIVE mg/dL
Ketones, ur: NEGATIVE mg/dL
Leukocytes, UA: NEGATIVE
Nitrite: NEGATIVE
Protein, ur: NEGATIVE mg/dL
Specific Gravity, Urine: 1.027 (ref 1.005–1.030)
Urobilinogen, UA: 1 mg/dL (ref 0.0–1.0)
pH: 6 (ref 5.0–8.0)

## 2014-04-16 MED ORDER — SODIUM CHLORIDE 0.9 % IV BOLUS (SEPSIS)
1000.0000 mL | Freq: Once | INTRAVENOUS | Status: AC
  Administered 2014-04-16: 1000 mL via INTRAVENOUS

## 2014-04-16 MED ORDER — DIAZEPAM 5 MG PO TABS: 5.0000 mg | ORAL_TABLET | Freq: Three times a day (TID) | ORAL | Status: DC | PRN

## 2014-04-16 MED ORDER — KETOROLAC TROMETHAMINE 30 MG/ML IJ SOLN
30.0000 mg | Freq: Once | INTRAMUSCULAR | Status: AC
  Administered 2014-04-16: 30 mg via INTRAVENOUS
  Filled 2014-04-16: qty 1

## 2014-04-16 MED ORDER — DIAZEPAM 5 MG/ML IJ SOLN
5.0000 mg | Freq: Once | INTRAMUSCULAR | Status: AC
  Administered 2014-04-16: 5 mg via INTRAVENOUS
  Filled 2014-04-16: qty 2

## 2014-04-16 NOTE — ED Notes (Signed)
Patient's sister and son at bedside, behavior is supportive.  Pt yelling saying "It's not working!" PT threw tissue box into hallway.  Explained that patient may not throw items and explained process of care to patient.  Pt able to be calmed, family remains at bedside - calm, cooperative, and supportive.

## 2014-04-16 NOTE — ED Notes (Signed)
Family at bedside with Chaplain.

## 2014-04-16 NOTE — ED Notes (Signed)
Patient states would like to go home. Pt denies SI/HI and states if she ever feels SI/HI, she is comfortable returning to the ED. Son is at bedside and states he is willing to bring her if she expresses SI/HI at any time. PT requesting  Prescriptions to help her "relax" upon discharge. Chris PA aware and in agreement.  PT is calm and cooperative at this time, son is also calm and cooperative.

## 2014-04-16 NOTE — Discharge Instructions (Signed)
Return here as needed.  Follow-up with your primary care doctor °

## 2014-04-16 NOTE — ED Notes (Signed)
Pt comfortable with discharge and follow up instructions. Prescriptions x1 

## 2014-04-16 NOTE — ED Provider Notes (Signed)
CSN: [id number]     Arrival date & time 04/16/14  4193 History   First MD Initiated Contact with Patient 04/16/14 0930     Chief Complaint  Patient presents with  . Abdominal Pain  . Headache  . Anxiety     (Consider location/radiation/quality/duration/timing/severity/associated sxs/prior Treatment) HPI Patient presents to the emergency department after her husband was pronounced dead here in the emergency department the patient had a stress response or she became very upset, complaining of head ache and abdominal pain.  The patient states everything is hurting and she is crying and sobbing.  The patient states that nothing seems make her condition better or worse.  She states that she is needs something to help her out.  Patient denies chest pain, shortness of breath, weakness, dizziness, vomiting, or syncope.  The patient states that she does have a history of anxiety and panic attacks. Past Medical History  Diagnosis Date  . Asthma   . EP (ectopic pregnancy)   . Ovarian tumor   . Tumor, thyroid   . Sleep apnea   . Chronic headaches   . Irregular heartbeat   . Chronic female pelvic pain 10/11/2012  . Abnormal uterine bleeding (AUB) 10/11/2012  . Cardiac arrest 1992    during delivery  . Shortness of breath   . Anemia   . Cancer     colon CA  . Chronic diarrhea   . Gait instability   . Frequent falls   . Anxiety   . Panic attack   . IBS (irritable bowel syndrome)   . Type II or unspecified type diabetes mellitus without mention of complication, not stated as uncontrolled 10/13/2013    type 2   Past Surgical History  Procedure Laterality Date  . Cholecystectomy  2005  . Unilateral salpingectomy  2009    Abdominal left salpingectomy  . Vaginal hysterectomy N/A 01/06/2013    Procedure: HYSTERECTOMY VAGINAL;  Surgeon: Osborne Oman, MD;  Location: [facility];  Service: Gynecology;  Laterality: N/A;  . Colonoscopy Left 04/29/2013    Procedure: COLONOSCOPY;  Surgeon: Arta Silence, MD;  Location: [facility];  Service: Endoscopy;  Laterality: Left;  . Flexible sigmoidoscopy N/A 11/09/2013    Procedure: FLEXIBLE SIGMOIDOSCOPY;  Surgeon: Arta Silence, MD;  Location: [facility];  Service: Endoscopy;  Laterality: N/A;  . Abdominal hysterectomy     Family History  Problem Relation Age of Onset  . Hypertension Mother   . Diabetes Mother   . Cancer Father   . Hyperlipidemia Father   . Hypertension Father   . Allergies Mother   . Heart disease Mother   . Heart disease Maternal Grandmother    History  Substance Use Topics  . Smoking status: Former Smoker -- 0.25 packs/day for 11 years    Types: Cigarettes    Quit date: 02/25/2014  . Smokeless tobacco: Never Used     Comment: 1 pack per week  . Alcohol Use: 4.8 oz/week    7 Glasses of wine, 1 Cans of beer per week     Comment: 1 bottle of wine per week - 02/20/14 - "I  have not drank in 8 months"   OB History    Gravida Para Term Preterm AB TAB SAB Ectopic Multiple Living   _0 Review of Systems All other systems negative except as documented in the HPI. All pertinent positives and negatives as reviewed in the HPI.  Allergies  Penicillins; Shellfish allergy; and Other  Home Medications   Prior to Admission medications   Medication Sig Start Date End Date Taking? Authorizing Provider  albuterol (PROVENTIL HFA;VENTOLIN HFA) 108 (90 BASE) MCG/ACT inhaler Inhale 1-2 puffs into the lungs every 6 (six) hours as needed for wheezing or shortness of breath. 02/23/14   Robbie Lis, MD  ALPRAZolam Duanne Moron) 0.25 MG tablet Take 1 tablet (0.25 mg total) by mouth 2 (two) times daily as needed for anxiety. Patient not taking: Reported on 04/11/2014 03/04/14   Modena Jansky, MD  DULoxetine (CYMBALTA) 30 MG capsule Take 1 capsule (30 mg total) by mouth daily. 03/06/14   Tresa Garter, MD  glucose blood test strip Use as instructed 10/13/13   Lance Bosch, NP  glucose monitoring kit  (FREESTYLE) monitoring kit 1 each by Does not apply route as needed for other. 10/13/13   Lance Bosch, NP  ipratropium-albuterol (DUONEB) 0.5-2.5 (3) MG/3ML SOLN Take 3 mLs by nebulization every 6 (six) hours as needed. 02/23/14   Robbie Lis, MD  Lancets (FREESTYLE) lancets Use as instructed 10/13/13   Lance Bosch, NP  metFORMIN (GLUCOPHAGE) 500 MG tablet Take 1 tablet (500 mg total) by mouth 2 (two) times daily with a meal. 10/10/13   Lance Bosch, NP  SUMAtriptan (IMITREX) 50 MG tablet Take 1 tablet (50 mg total) by mouth once. May repeat in 2 hours if headache persists or recurs. 03/27/14   Tresa Garter, MD  zolpidem (AMBIEN) 5 MG tablet Take 1 tablet (5 mg total) by mouth at bedtime as needed for sleep. Patient not taking: Reported on 04/11/2014 03/06/14   Tresa Garter, MD   BP 130/82 mmHg  Pulse 111  Temp(Src) 99.5 F (37.5 C) (Oral)  Resp 16  SpO2 97%  LMP 11/27/2012 Physical Exam  Constitutional: She is oriented to person, place, and time. She appears well-developed and well-nourished.  Eyes: Pupils are equal, round, and reactive to light.  Neck: Normal range of motion. Neck supple.  Cardiovascular: Regular rhythm and normal heart sounds.  Tachycardia present.  Exam reveals no gallop and no friction rub.   No murmur heard. Pulmonary/Chest: Effort normal and breath sounds normal. No respiratory distress.  Musculoskeletal: She exhibits no edema.  Neurological: She is alert and oriented to person, place, and time. No cranial nerve deficit. She exhibits normal muscle tone. Coordination normal.  Skin: Skin is warm and dry. No rash noted. No erythema.  Psychiatric: Her mood appears anxious. She is agitated and hyperactive. Cognition and memory are not impaired. She does not express impulsivity. She expresses no homicidal and no suicidal ideation. She expresses no suicidal plans and no homicidal plans.  Nursing note and vitals reviewed.   ED Course  Procedures (including  critical care time) Labs Review Labs Reviewed  COMPREHENSIVE METABOLIC PANEL - Abnormal; Notable for the following:    Glucose, Bld 118 (*)    AST 39 (*)    ALT 63 (*)    All other components within normal limits  URINALYSIS, ROUTINE W REFLEX MICROSCOPIC - Abnormal; Notable for the following:    APPearance CLOUDY (*)    Hgb urine dipstick TRACE (*)    All other components within normal limits  URINE MICROSCOPIC-ADD ON - Abnormal; Notable for the following:    Squamous Epithelial / LPF MANY (*)    Bacteria, UA MANY (*)    Casts GRANULAR CAST (*)    All other components within  normal limits  CBC WITH DIFFERENTIAL/PLATELET    Patient is feeling completely resolution of her symptoms.  She will be discharged home.  I have advised her to follow up with her primary care doctor.  I feel like this was an anxiety reaction to the death of her husband  MDM   Final diagnoses:  None       Brent General, PA-C 04/16/14 [place], DO 04/17/14 1032

## 2014-04-16 NOTE — ED Notes (Signed)
Pt. Stated, Im having sharpe abdominal pain and my head hurts.  My husband just died.

## 2014-04-16 NOTE — ED Notes (Signed)
Received call from lab that CBC with Diff clotted. Chris PA aware, states no need to re-order/recollect at this time. Lab made aware.

## 2014-04-17 ENCOUNTER — Other Ambulatory Visit (INDEPENDENT_AMBULATORY_CARE_PROVIDER_SITE_OTHER): Payer: Self-pay | Admitting: *Deleted

## 2014-04-17 ENCOUNTER — Encounter (HOSPITAL_BASED_OUTPATIENT_CLINIC_OR_DEPARTMENT_OTHER): Payer: Self-pay | Admitting: *Deleted

## 2014-04-17 DIAGNOSIS — K602 Anal fissure, unspecified: Secondary | ICD-10-CM

## 2014-04-17 MED ORDER — BUDESONIDE-FORMOTEROL FUMARATE 160-4.5 MCG/ACT IN AERO: 2.0000 | INHALATION_SPRAY | Freq: Two times a day (BID) | RESPIRATORY_TRACT | Status: DC

## 2014-04-17 NOTE — Telephone Encounter (Signed)
Called and spoke to pt. Pt requesting rx of Symbicort sent to pharmacy. Rx sent. Pt verbalized understanding and denied any further questions at this time.

## 2014-04-17 NOTE — Progress Notes (Addendum)
NPO AFTER MN. ARRIVE AT 0700. NEEDS ISTAT. CURRENT CXR AND EKG IN CHART AND EPIC. WILL DO SYMBICORT INHALER AM DOS W/ SIPS OF WATER.  PT STATES DIFFICULT IV STICK, NORMALLY GETS A PICC LINE. TOLD PT TO EXCEPT CALL FROM OFFICE WITH ARRANGEMENTS FOR PICC PRIOR TO DOS. CALLED OFFICE AND SPOKE WITH DR Manon Hilding NURSE, JENNIFER, AND INFORMED HER OF THIS.  JENNIFER SPOKE WITH DR Marcello Moores AND WAS GIVEN ORDERS TO ARRANGE THIS THRU IR TEAM AT [facility].  ON ARRIVAL CALL IV THERAPY TEAM TO ACCESS PICC.  PT IS USING A WALKER DUE TO RIGHT LEG WEAKNESS , PT STATES HAS HAD PROBLEM JAN 2016 [place].

## 2014-04-18 ENCOUNTER — Other Ambulatory Visit (INDEPENDENT_AMBULATORY_CARE_PROVIDER_SITE_OTHER): Payer: Self-pay | Admitting: *Deleted

## 2014-04-18 DIAGNOSIS — K602 Anal fissure, unspecified: Secondary | ICD-10-CM

## 2014-04-19 ENCOUNTER — Ambulatory Visit (HOSPITAL_COMMUNITY)
Admission: RE | Admit: 2014-04-19 | Discharge: 2014-04-19 | Disposition: A | Discharge status: Home / Self Care | Payer: 59 | Source: Ambulatory Visit | Attending: Interventional Radiology | Admitting: Interventional Radiology

## 2014-04-19 DIAGNOSIS — K602 Anal fissure, unspecified: Secondary | ICD-10-CM | POA: Insufficient documentation

## 2014-04-19 DIAGNOSIS — Z452 Encounter for adjustment and management of vascular access device: Secondary | ICD-10-CM | POA: Diagnosis present

## 2014-04-19 MED ORDER — HEPARIN SOD (PORK) LOCK FLUSH 100 UNIT/ML IV SOLN
INTRAVENOUS | Status: AC
  Filled 2014-04-19: qty 5

## 2014-04-19 MED ORDER — LIDOCAINE HCL 1 % IJ SOLN
INTRAMUSCULAR | Status: AC
  Filled 2014-04-19: qty 20

## 2014-04-20 ENCOUNTER — Ambulatory Visit (HOSPITAL_BASED_OUTPATIENT_CLINIC_OR_DEPARTMENT_OTHER)
Admission: RE | Admit: 2014-04-20 | Discharge: 2014-04-20 | Disposition: A | Discharge status: Home / Self Care | Payer: 59 | Source: Ambulatory Visit | Attending: General Surgery | Admitting: General Surgery

## 2014-04-20 ENCOUNTER — Ambulatory Visit (HOSPITAL_BASED_OUTPATIENT_CLINIC_OR_DEPARTMENT_OTHER): Payer: 59 | Admitting: Anesthesiology

## 2014-04-20 ENCOUNTER — Encounter (HOSPITAL_BASED_OUTPATIENT_CLINIC_OR_DEPARTMENT_OTHER)
Admission: RE | Disposition: A | Discharge status: Home / Self Care | Payer: Self-pay | Source: Ambulatory Visit | Attending: General Surgery

## 2014-04-20 ENCOUNTER — Encounter (HOSPITAL_BASED_OUTPATIENT_CLINIC_OR_DEPARTMENT_OTHER): Payer: Self-pay | Admitting: *Deleted

## 2014-04-20 DIAGNOSIS — G43909 Migraine, unspecified, not intractable, without status migrainosus: Secondary | ICD-10-CM | POA: Diagnosis not present

## 2014-04-20 DIAGNOSIS — Z791 Long term (current) use of non-steroidal anti-inflammatories (NSAID): Secondary | ICD-10-CM | POA: Insufficient documentation

## 2014-04-20 DIAGNOSIS — Z9089 Acquired absence of other organs: Secondary | ICD-10-CM | POA: Insufficient documentation

## 2014-04-20 DIAGNOSIS — I10 Essential (primary) hypertension: Secondary | ICD-10-CM | POA: Diagnosis not present

## 2014-04-20 DIAGNOSIS — Z87891 Personal history of nicotine dependence: Secondary | ICD-10-CM | POA: Diagnosis not present

## 2014-04-20 DIAGNOSIS — E119 Type 2 diabetes mellitus without complications: Secondary | ICD-10-CM | POA: Insufficient documentation

## 2014-04-20 DIAGNOSIS — Z79899 Other long term (current) drug therapy: Secondary | ICD-10-CM | POA: Diagnosis not present

## 2014-04-20 DIAGNOSIS — Z6841 Body Mass Index (BMI) 40.0 and over, adult: Secondary | ICD-10-CM | POA: Diagnosis not present

## 2014-04-20 DIAGNOSIS — Z8249 Family history of ischemic heart disease and other diseases of the circulatory system: Secondary | ICD-10-CM | POA: Diagnosis not present

## 2014-04-20 DIAGNOSIS — K601 Chronic anal fissure: Secondary | ICD-10-CM | POA: Insufficient documentation

## 2014-04-20 DIAGNOSIS — G4730 Sleep apnea, unspecified: Secondary | ICD-10-CM | POA: Insufficient documentation

## 2014-04-20 DIAGNOSIS — J45909 Unspecified asthma, uncomplicated: Secondary | ICD-10-CM | POA: Insufficient documentation

## 2014-04-20 DIAGNOSIS — K219 Gastro-esophageal reflux disease without esophagitis: Secondary | ICD-10-CM | POA: Diagnosis not present

## 2014-04-20 DIAGNOSIS — Z862 Personal history of diseases of the blood and blood-forming organs and certain disorders involving the immune mechanism: Secondary | ICD-10-CM | POA: Diagnosis not present

## 2014-04-20 DIAGNOSIS — K6289 Other specified diseases of anus and rectum: Secondary | ICD-10-CM | POA: Diagnosis present

## 2014-04-20 DIAGNOSIS — F329 Major depressive disorder, single episode, unspecified: Secondary | ICD-10-CM | POA: Diagnosis not present

## 2014-04-20 HISTORY — DX: Personal history of adenomatous and serrated colon polyps: Z86.0101

## 2014-04-20 HISTORY — DX: Obstructive sleep apnea (adult) (pediatric): G47.33

## 2014-04-20 HISTORY — DX: Other specified health status: Z78.9

## 2014-04-20 HISTORY — DX: Low back pain, unspecified: M54.50

## 2014-04-20 HISTORY — DX: Personal history of sudden cardiac arrest: Z86.74

## 2014-04-20 HISTORY — DX: Personal history of colonic polyps: Z86.010

## 2014-04-20 HISTORY — DX: Other chronic pain: G89.29

## 2014-04-20 HISTORY — PX: EVALUATION UNDER ANESTHESIA WITH FISTULECTOMY: SHX5623

## 2014-04-20 HISTORY — DX: Personal history of other mental and behavioral disorders: Z86.59

## 2014-04-20 HISTORY — DX: Spinal stenosis, lumbar region without neurogenic claudication: M48.061

## 2014-04-20 HISTORY — DX: Unspecified ovarian cyst, right side: N83.201

## 2014-04-20 HISTORY — DX: Other specified diseases of anus and rectum: K62.89

## 2014-04-20 HISTORY — DX: Other symptoms and signs involving the musculoskeletal system: R29.898

## 2014-04-20 HISTORY — DX: Personal history of other complications of pregnancy, childbirth and the puerperium: Z87.59

## 2014-04-20 HISTORY — DX: Type 2 diabetes mellitus without complications: E11.9

## 2014-04-20 HISTORY — PX: SPHINCTEROTOMY: SHX5279

## 2014-04-20 HISTORY — DX: Low back pain: M54.5

## 2014-04-20 LAB — POCT I-STAT, CHEM 8
BUN: 6 mg/dL (ref 6–23)
Calcium, Ion: 1.22 mmol/L (ref 1.12–1.23)
Chloride: 102 mmol/L (ref 96–112)
Creatinine, Ser: 0.6 mg/dL (ref 0.50–1.10)
Glucose, Bld: 114 mg/dL — ABNORMAL HIGH (ref 70–99)
HCT: 41 % (ref 36.0–46.0)
Hemoglobin: 13.9 g/dL (ref 12.0–15.0)
Potassium: 3.7 mmol/L (ref 3.5–5.1)
Sodium: 140 mmol/L (ref 135–145)
TCO2: 23 mmol/L (ref 0–100)

## 2014-04-20 LAB — GLUCOSE, CAPILLARY: Glucose-Capillary: 118 mg/dL — ABNORMAL HIGH (ref 70–99)

## 2014-04-20 SURGERY — EXAM UNDER ANESTHESIA WITH FISTULECTOMY
Anesthesia: Monitor Anesthesia Care | Site: Rectum

## 2014-04-20 MED ORDER — LIDOCAINE 5 % EX OINT
TOPICAL_OINTMENT | CUTANEOUS | Status: DC | PRN
  Administered 2014-04-20: 1

## 2014-04-20 MED ORDER — PROPOFOL 10 MG/ML IV EMUL
INTRAVENOUS | Status: DC | PRN
  Administered 2014-04-20: 200 ug/kg/min via INTRAVENOUS

## 2014-04-20 MED ORDER — FENTANYL CITRATE 0.05 MG/ML IJ SOLN
25.0000 ug | INTRAMUSCULAR | Status: DC | PRN
  Filled 2014-04-20: qty 1

## 2014-04-20 MED ORDER — OXYCODONE HCL 5 MG PO TABS
5.0000 mg | ORAL_TABLET | ORAL | Status: DC | PRN
  Filled 2014-04-20: qty 2

## 2014-04-20 MED ORDER — FENTANYL CITRATE 0.05 MG/ML IJ SOLN
INTRAMUSCULAR | Status: AC
  Filled 2014-04-20: qty 2

## 2014-04-20 MED ORDER — SODIUM CHLORIDE 0.9 % IJ SOLN
3.0000 mL | INTRAMUSCULAR | Status: DC | PRN
  Filled 2014-04-20: qty 3

## 2014-04-20 MED ORDER — LACTATED RINGERS IV SOLN
INTRAVENOUS | Status: DC
  Administered 2014-04-20 (×2): via INTRAVENOUS
  Filled 2014-04-20: qty 1000

## 2014-04-20 MED ORDER — BUPIVACAINE-EPINEPHRINE 0.5% -1:200000 IJ SOLN
INTRAMUSCULAR | Status: DC | PRN
  Administered 2014-04-20: 30 mL

## 2014-04-20 MED ORDER — MIDAZOLAM HCL 2 MG/2ML IJ SOLN
INTRAMUSCULAR | Status: AC
  Filled 2014-04-20: qty 2

## 2014-04-20 MED ORDER — ACETAMINOPHEN 650 MG RE SUPP
650.0000 mg | RECTAL | Status: DC | PRN
  Filled 2014-04-20: qty 1

## 2014-04-20 MED ORDER — SODIUM CHLORIDE 0.9 % IV SOLN
250.0000 mL | INTRAVENOUS | Status: DC | PRN
Start: 2014-04-20 — End: 2014-04-20
  Filled 2014-04-20: qty 250

## 2014-04-20 MED ORDER — KETAMINE HCL 100 MG/ML IJ SOLN
250.0000 mg | INTRAMUSCULAR | Status: DC | PRN
Start: 2014-04-20 — End: 2014-04-20
  Administered 2014-04-20: 10 ug/kg/min via INTRAVENOUS

## 2014-04-20 MED ORDER — GLYCOPYRROLATE 0.2 MG/ML IJ SOLN: INTRAMUSCULAR | Status: DC | PRN

## 2014-04-20 MED ORDER — MIDAZOLAM HCL 5 MG/5ML IJ SOLN
INTRAMUSCULAR | Status: DC | PRN
  Administered 2014-04-20: 2 mg via INTRAVENOUS

## 2014-04-20 MED ORDER — SODIUM CHLORIDE 0.9 % IR SOLN
Status: DC | PRN
  Administered 2014-04-20: 500 mL

## 2014-04-20 MED ORDER — HYDROCODONE-ACETAMINOPHEN 5-325 MG PO TABS: 1.0000 | ORAL_TABLET | Freq: Four times a day (QID) | ORAL | Status: DC | PRN

## 2014-04-20 MED ORDER — ACETAMINOPHEN 325 MG PO TABS
650.0000 mg | ORAL_TABLET | ORAL | Status: DC | PRN
  Filled 2014-04-20: qty 2

## 2014-04-20 MED ORDER — SODIUM CHLORIDE 0.9 % IJ SOLN
3.0000 mL | Freq: Two times a day (BID) | INTRAMUSCULAR | Status: DC
  Filled 2014-04-20: qty 3

## 2014-04-20 MED ORDER — PROMETHAZINE HCL 25 MG/ML IJ SOLN
6.2500 mg | INTRAMUSCULAR | Status: DC | PRN
  Filled 2014-04-20: qty 1

## 2014-04-20 SURGICAL SUPPLY — 36 items
BENZOIN TINCTURE PRP APPL 2/3 (GAUZE/BANDAGES/DRESSINGS) ×2 IMPLANT
BLADE HEX COATED 2.75 (ELECTRODE) ×2 IMPLANT
BLADE SURG 15 STRL LF DISP TIS (BLADE) ×1 IMPLANT
BLADE SURG 15 STRL SS (BLADE) ×1
BRIEF STRETCH FOR OB PAD LRG (UNDERPADS AND DIAPERS) ×2 IMPLANT
COVER MAYO STAND STRL (DRAPES) ×2 IMPLANT
COVER TABLE BACK 60X90 (DRAPES) ×2 IMPLANT
DRAPE PED LAPAROTOMY (DRAPES) ×2 IMPLANT
DRAPE UTILITY XL STRL (DRAPES) ×2 IMPLANT
DRSG PAD ABDOMINAL 8X10 ST (GAUZE/BANDAGES/DRESSINGS) ×2 IMPLANT
ELECT REM PT RETURN 9FT ADLT (ELECTROSURGICAL) ×2
ELECTRODE REM PT RTRN 9FT ADLT (ELECTROSURGICAL) ×1 IMPLANT
GAUZE SPONGE 4X4 16PLY XRAY LF (GAUZE/BANDAGES/DRESSINGS) ×2 IMPLANT
GLOVE BIO SURGEON STRL SZ 6.5 (GLOVE) ×6 IMPLANT
GLOVE BIOGEL PI IND STRL 6.5 (GLOVE) ×2 IMPLANT
GLOVE BIOGEL PI IND STRL 7.0 (GLOVE) ×1 IMPLANT
GLOVE BIOGEL PI INDICATOR 6.5 (GLOVE) ×2
GLOVE BIOGEL PI INDICATOR 7.0 (GLOVE) ×1
GLOVE INDICATOR 7.0 STRL GRN (GLOVE) ×2 IMPLANT
GOWN STRL REUS W/ TWL LRG LVL3 (GOWN DISPOSABLE) ×2 IMPLANT
GOWN STRL REUS W/TWL 2XL LVL3 (GOWN DISPOSABLE) ×4 IMPLANT
GOWN STRL REUS W/TWL LRG LVL3 (GOWN DISPOSABLE) ×2
NEEDLE HYPO 25X1 1.5 SAFETY (NEEDLE) ×2 IMPLANT
NS IRRIG 500ML POUR BTL (IV SOLUTION) ×2 IMPLANT
PACK BASIN DAY SURGERY FS (CUSTOM PROCEDURE TRAY) ×2 IMPLANT
PENCIL BUTTON HOLSTER BLD 10FT (ELECTRODE) ×2 IMPLANT
SPONGE GAUZE 4X4 12PLY (GAUZE/BANDAGES/DRESSINGS) ×2 IMPLANT
SPONGE GAUZE 4X4 12PLY STER LF (GAUZE/BANDAGES/DRESSINGS) ×2 IMPLANT
SUT CHROMIC 2 0 SH (SUTURE) ×2 IMPLANT
SUT GUT CHROMIC 3 0 (SUTURE) ×2 IMPLANT
SYR CONTROL 10ML LL (SYRINGE) ×2 IMPLANT
TOWEL OR 17X24 6PK STRL BLUE (TOWEL DISPOSABLE) ×2 IMPLANT
TRAY DSU PREP LF (CUSTOM PROCEDURE TRAY) ×2 IMPLANT
TUBE CONNECTING 12X1/4 (SUCTIONS) ×2 IMPLANT
UNDERPAD 30X30 INCONTINENT (UNDERPADS AND DIAPERS) ×2 IMPLANT
YANKAUER SUCT BULB TIP NO VENT (SUCTIONS) ×2 IMPLANT

## 2014-04-20 NOTE — H&P (View-Only) (Signed)
Marisa [name] Patient #: [id number] DOB: 03/30/1975 (39 years)   Date of Encounter: 03/31/2014    History of Present Illness   Patient words: fu anal fissure.  The patient is a 39 year old female who presents with anal pain. This is a 39-year-old female who has been here in the office in the past for an anal fissure. She returns for follow-up. She has tried multiple courses of nitroglycerin and diltiazem ointments. She continues to have pain and anal bleeding with bowel movements. She is recently in the hospital for pneumonia. She was noted to have rectal bleeding at that time. A flexible sigmoidoscopy was performed and showed no other signs of bleeding. She is using a fiber supplement and having soft bowel movements without straining.       Allergies   Penicillins [Drug allergy]  SHELLFISH    Family History   Migraine Headache: Father  Hypertension: Father, Mother  Heart disease in female family member before age 65  Thyroid problems: Mother  Seizure disorder: Son  Rectal Cancer: Father  Heart Disease: Mother  Bleeding disorder: Mother  Arthritis: Father, Mother  Alcohol Abuse: Brother, Sister  Depression: Brother, Sister, Son  Colon Polyps: Family Members In General  Colon Cancer: Father    Social History   No drug use  Tobacco use: Current some day smoker  Alcohol use: Remotely quit alcohol use  Caffeine use: Coffee    Medication History   MetFORMIN HCl (500MG Tablet, Oral) Active.  TRUEplus Lancets 28G Active.  TRUEtest Test ( In Vitro) Active.  TRUEresult Blood Glucose (w/Device Kit,) Active.  Albuterol Sulfate HFA (108 (90 Base)MCG/ACT Aerosol Soln, Inhalation) Active.  Cymbalta (30MG Capsule DR Part, Oral) Active.  DuoNeb (0.5-2.5 (3)MG/3ML Solution, Inhalation) Active.  Imitrex (50MG Tablet, Oral) Active.  Dicyclomine HCl (20MG Tablet, Oral) Active.    Other Problems   Gastroesophageal Reflux Disease  Gastric Ulcer  Migraine Headache  ANAL FISSURE (565.0   K60.2) ; This is a 39-year-old female who has been treated for an anal fissure. She has used short course doses of several ointments including nitroglycerin.  ANAL PAIN (569.42  K62.89)   Asthma  Anxiety Disorder  Back Pain  Diabetes Mellitus  Bladder Problems    Past Surgical   Hysterectomy (not due to cancer) - Partial  Resection of Stomach  Gallbladder Surgery - Laparoscopic  Cesarean Section - 1  Colon Polyp Removal - Colonoscopy     Vitals   03/31/2014 09:11 AM   Weight: 253 lb, 2 oz Height: 65 in   Body Surface Area: 2.29 m Body Mass Index: 42.12 kg/m     Temp.: 98.7 F (Temporal) Pulse: 64 (Regular) Resp.: 18 (Unlabored)   BP: 118/82 Manual (Sitting, Left Arm, Standard)     Physical Exam  The physical exam findings are as follows:  General Mental Status - Alert.  General Appearance - Consistent with stated age.  Hydration - Well hydrated.  Voice - Normal.    Head and Neck Head - normocephalic, atraumatic with no lesions or palpable masses.  Trachea - midline.  Thyroid - Gland Characteristics - normal size and consistency.    Eye Eyeball - Bilateral - Extraocular movements intact.  Sclera/Conjunctiva - Bilateral - No scleral icterus.    Chest and Lung Exam Chest and lung exam reveals - quiet, even and easy respiratory effort with no use of accessory muscles and on auscultation, normal breath sounds, no adventitious sounds and normal vocal resonance.  Inspection - Chest Wall -   Normal. Back - normal.    Cardiovascular Cardiovascular examination reveals - normal heart sounds, regular rate and rhythm with no murmurs and normal pedal pulses bilaterally.    Abdomen Inspection - Inspection of the abdomen reveals - No Hernias. Skin - Scar - no surgical scars.  Palpation/Percussion - Palpation and Percussion of the abdomen reveal - Soft, Non Tender, No Rebound tenderness, No Rigidity (guarding) and No hepatosplenomegaly.  Auscultation - Auscultation of the abdomen  reveals - Bowel sounds normal.    Rectal Anorectal Exam - External - Note: Small shallow fissure noted posteriorly. Sphincter hypertension present. Possible fistula noted.    Neurologic Neurologic evaluation reveals - alert and oriented x 3 with no impairment of recent or remote memory.  Mental Status - Normal.     Assessment & Plan  ANAL PAIN [phone]  K62.89)  Problem Story: This is a 39 year old female who reports to the office with continued anal pain with bowel movements. She has failed medical management and is requesting further therapy. We discussed the role of chemicals being durotomy versus lateral internal sphincterotomy. She understands that there is a 5% risk of incontinence with lateral internal durotomy. She is decided to proceed with this choice if needed. I have recommended that she undergo an exam under anesthesia to further evaluate the source of her anal pain. I am not completely certain that she has a fissure. She may have a fistula in the posterior midline. In that case we would do a fistulotomy if needed.

## 2014-04-20 NOTE — Discharge Instructions (Addendum)
ANORECTAL SURGERY: POST OP INSTRUCTIONS °1. Take your usually prescribed home medications unless otherwise directed. °2. DIET: During the first few hours after surgery sip on some liquids until you are able to urinate.  It is normal to not urinate for several hours after this surgery.  If you feel uncomfortable, please contact the office for instructions.  After you are able to urinate,you may eat, if you feel like it.  Follow a light bland diet the first 24 hours after arrival home, such as soup, liquids, crackers, etc.  Be sure to include lots of fluids daily (6-8 glasses).  Avoid fast food or heavy meals, as your are more likely to get nauseated.  Eat a low fat diet the next few days after surgery.  Limit caffeine intake to 1-2 servings a day. °3. PAIN CONTROL: °a. Pain is best controlled by a usual combination of several different methods TOGETHER: °i. Muscle relaxation: Soak in a warm bath (or Sitz bath) three times a day and after bowel movements.  Continue to do this until all pain is resolved. °ii. Over the counter pain medication °iii. Prescription pain medication °b. Most patients will experience some swelling and discomfort in the anus/rectal area and incisions.  Heat such as warm towels, sitz baths, warm baths, etc to help relax tight/sore spots and speed recovery.  Some people prefer to use ice, especially in the first couple days after surgery, as it may decrease the pain and swelling, or alternate between ice & heat.  Experiment to what works for you.  Swelling and bruising can take several weeks to resolve.  Pain can take even longer to completely resolve. °c. It is helpful to take an over-the-counter pain medication regularly for the first few weeks.  Choose one of the following that works best for you: °i. Naproxen (Aleve, etc)  Two 220mg tabs twice a day °ii. Ibuprofen (Advil, etc) Three 200mg tabs four times a day (every meal & bedtime) °d. A  prescription for pain medication (such as percocet,  oxycodone, hydrocodone, etc) should be given to you upon discharge.  Take your pain medication as prescribed.  °i. If you are having problems/concerns with the prescription medicine (does not control pain, nausea, vomiting, rash, itching, etc), please call us [phone] to see if we need to switch you to a different pain medicine that will work better for you and/or control your side effect better. °ii. If you need a refill on your pain medication, please contact your pharmacy.  They will contact our office to request authorization. Prescriptions will not be filled after 5 pm or on week-ends. °4. KEEP YOUR BOWELS REGULAR and AVOID CONSTIPATION °a. The goal is one to two soft bowel movements a day.  You should at least have a bowel movement every other day. °b. Avoid getting constipated.  Between the surgery and the pain medications, it is common to experience some constipation. This can be very painful after rectal surgery.  Increasing fluid intake and taking a fiber supplement (such as Metamucil, Citrucel, FiberCon, etc) 1-2 times a day regularly will usually help prevent this problem from occurring.  A stool softener like colace is also recommended.  This can be purchased over the counter at your pharmacy.  You can take it up to 3 times a day.  If you do not have a bowel movement after 24 hrs since your surgery, take one does of milk of magnesia.  If you still haven't had a bowel movement 8-12 hours after   that dose, take another dose.  If you don't have a bowel movement 48 hrs after surgery, purchase a Fleets enema from the drug store and administer gently per package instructions.  If you still are having trouble with your bowel movements after that, please call the office for further instructions. °c. If you develop diarrhea or have many loose bowel movements, simplify your diet to bland foods & liquids for a few days.  Stop any stool softeners and decrease your fiber supplement.  Switching to mild  anti-diarrheal medications (Kayopectate, Pepto Bismol) can help.  If this worsens or does not improve, please call us. ° °5. Wound Care °a. Remove your bandages before your first bowel movement or 8 hours after surgery.     °b. Remove any wound packing material at this tim,e as well.  You do not need to repack the wound unless instructed otherwise.  Wear an absorbent pad or soft cotton gauze in your underwear to catch any drainage and help keep the area clean. You should change this every 2-3 hours while awake. °c. Keep the area clean and dry.  Bathe / shower every day, especially after bowel movements.  Keep the area clean by showering / bathing over the incision / wound.   It is okay to soak an open wound to help wash it.  Wet wipes or showers / gentle washing after bowel movements is often less traumatic than regular toilet paper. °d. You may have some styrofoam-like soft packing in the rectum which will come out with the first bowel movement.  °e. You will often notice bleeding with bowel movements.  This should slow down by the end of the first week of surgery °f. Expect some drainage.  This should slow down, too, by the end of the first week of surgery.  Wear an absorbent pad or soft cotton gauze in your underwear until the drainage stops. °g. Do Not sit on a rubber or pillow ring.  This can make you symptoms worse.  You may sit on a soft pillow if needed.  °6. ACTIVITIES as tolerated:   °a. You may resume regular (light) daily activities beginning the next day--such as daily self-care, walking, climbing stairs--gradually increasing activities as tolerated.  If you can walk 30 minutes without difficulty, it is safe to try more intense activity such as jogging, treadmill, bicycling, low-impact aerobics, swimming, etc. °b. Save the most intensive and strenuous activity for last such as sit-ups, heavy lifting, contact sports, etc  Refrain from any heavy lifting or straining until you are off narcotics for pain  control.   °c. You may drive when you are no longer taking prescription pain medication, you can comfortably sit for long periods of time, and you can safely maneuver your car and apply brakes. °d. You may have sexual intercourse when it is comfortable.  °7. FOLLOW UP in our office °a. Please call [facility] at [phone] to set up an appointment to see your surgeon in the office for a follow-up appointment approximately 3-4 weeks after your surgery. °b. Make sure that you call for this appointment the day you arrive home to insure a convenient appointment time. °10. IF YOU HAVE DISABILITY OR FAMILY LEAVE FORMS, BRING THEM TO THE OFFICE FOR PROCESSING.  DO NOT GIVE THEM TO YOUR DOCTOR. ° ° ° ° °WHEN TO CALL US [phone]: °1. Poor pain control °2. Reactions / problems with new medications (rash/itching, nausea, etc)  °3. Fever over 101.5 F (38.5 C) °4.   Inability to urinate °5. Nausea and/or vomiting °6. Worsening swelling or bruising °7. Continued bleeding from incision. °8. Increased pain, redness, or drainage from the incision ° °The clinic staff is available to answer your questions during regular business hours (8:30am-5pm).  Please don’t hesitate to call and ask to speak to one of our nurses for clinical concerns.   A surgeon from [facility] is always on call at the hospitals °  °If you have a medical emergency, go to the nearest emergency room or call 911. °  ° °[facility] °[address], [address], [place], [place]  [address] ? °MAIN: [phone] ? TOLL FREE: [phone] ? °FAX [phone] °[email or web address] ° ° ° ° ° °Post Anesthesia Home Care Instructions ° °Activity: °Get plenty of rest for the remainder of the day. A responsible adult should stay with you for 24 hours following the procedure.  °For the next 24 hours, DO NOT: °-Drive a car °-Operate machinery °-Drink alcoholic beverages °-Take any medication unless instructed by your  physician °-Make any legal decisions or sign important papers. ° °Meals: °Start with liquid foods such as gelatin or soup. Progress to regular foods as tolerated. Avoid greasy, spicy, heavy foods. If nausea and/or vomiting occur, drink only clear liquids until the nausea and/or vomiting subsides. Call your physician if vomiting continues. ° °Special Instructions/Symptoms: °Your throat may feel dry or sore from the anesthesia or the breathing tube placed in your throat during surgery. If this causes discomfort, gargle with warm salt water. The discomfort should disappear within 24 hours. ° °

## 2014-04-20 NOTE — Anesthesia Preprocedure Evaluation (Signed)
Anesthesia Evaluation  Patient identified by MRN, date of birth, ID band Patient awake    Reviewed: Allergy & Precautions, NPO status , Patient's Chart, lab work & pertinent test results  Airway Mallampati: II  TM Distance: >3 FB Neck ROM: Full    Dental no notable dental hx.    Pulmonary shortness of breath, asthma , sleep apnea , former smoker,  breath sounds clear to auscultation  Pulmonary exam normal       Cardiovascular negative cardio ROS  Rhythm:Regular Rate:Normal     Neuro/Psych  Headaches, PSYCHIATRIC DISORDERS Anxiety Depression    GI/Hepatic negative GI ROS, Neg liver ROS,   Endo/Other  diabetes, Type 2, Oral Hypoglycemic AgentsMorbid obesity  Renal/GU negative Renal ROS  negative genitourinary   Musculoskeletal negative musculoskeletal ROS (+)   Abdominal (+) + obese,   Peds negative pediatric ROS (+)  Hematology  (+) anemia ,   Anesthesia Other Findings   Reproductive/Obstetrics negative OB ROS                             Anesthesia Physical Anesthesia Plan  ASA: III  Anesthesia Plan: MAC   Post-op Pain Management:    Induction: Intravenous  Airway Management Planned:   Additional Equipment:   Intra-op Plan:   Post-operative Plan:   Informed Consent: I have reviewed the patients History and Physical, chart, labs and discussed the procedure including the risks, benefits and alternatives for the proposed anesthesia with the patient or authorized representative who has indicated his/her understanding and acceptance.   Dental advisory given  Plan Discussed with: CRNA  Anesthesia Plan Comments:         Anesthesia Quick Evaluation

## 2014-04-20 NOTE — Interval H&P Note (Signed)
History and Physical Interval Note:  04/20/2014 7:32 AM  Marisa [name]  has presented today for surgery, with the diagnosis of anal pain  The various methods of treatment have been discussed with the patient and family. After consideration of risks, benefits and other options for treatment, the patient has consented to  Procedure(s): EXAM UNDER ANESTHESIA WITH POSSIBLE FISTULOTOMY (N/A) POSSIBLE LATERAL INTERNAL SPHINCTEROTOMY (N/A) as a surgical intervention .  The patient's history has been reviewed, patient examined, no change in status, stable for surgery.  I have reviewed the patient's chart and labs.  Questions were answered to the patient's satisfaction.     Rosario Adie, MD  [facility]

## 2014-04-20 NOTE — Op Note (Signed)
04/20/2014  9:10 AM  PATIENT:  Marisa [name]  39 y.o. female  Patient Care Team: Tresa Garter, MD as PCP - General (Internal Medicine)  PRE-OPERATIVE DIAGNOSIS:  anal pain  POST-OPERATIVE DIAGNOSIS:  anal fissure, chronic  PROCEDURE:  Procedure(s): EXAM UNDER ANESTHESIA   LATERAL INTERNAL SPHINCTEROTOMY  SURGEON:  Surgeon(s): Leighton Ruff, MD  ASSISTANT: none   ANESTHESIA:   local and MAC  SPECIMEN:  No Specimen  DISPOSITION OF SPECIMEN:  N/A  COUNTS:  YES  PLAN OF CARE: Discharge to home after PACU  PATIENT DISPOSITION:  PACU - hemodynamically stable.  INDICATION: This is a 39 year old female who presents to the office with a chronic internal posterior anal fissure. She has tried multiple rounds of medical therapy and has not been able to get this area to heal. We discussed chemical speech enterotomy versus lateral internal speech enterotomy. We discussed the risk and benefits of each procedure. She has elected to proceed with lateral internal sphincterotomy. She understands that there is a 5% risk of incontinence with this procedure.   OR FINDINGS: Chronic anal fissure with significant scarring noted. This painter hypertension noted  DESCRIPTION: the patient was identified in the preoperative holding area and taken to the OR where they were laid on the operating room table.  MAC anesthesia was induced without difficulty. The patient was then positioned in prone jackknife position with buttocks gently taped apart.  The patient was then prepped and draped in usual sterile fashion.  SCDs were noted to be in place prior to the initiation of anesthesia. A surgical timeout was performed indicating the correct patient, procedure, positioning and need for preoperative antibiotics.  A rectal block was performed using half percent Marcaine with epinephrine.  I began with a digital rectal exam.  There were no masses noted. Sphincter hypertension was noted.  I then placed a  Hill-Ferguson anoscope into the anal canal and evaluated this completely.  The anal fissure could be identified in the posterior midline. There was scarring around it suggesting chronicity of this lesion. I evaluated her anal canal. There were no other lesions that could be causing bleeding or hemorrhage. There were no hemorrhoids noted. With a Hill-Ferguson anoscope inside the anal canal identified the intersphincteric groove and made a 1 cm incision. Dissection was carried down to the lateral internal sphincter. Metzenbaum scissors were used to divide the internal sphincter down to the most proximal level of the anal fissure. After this was completed the anal canal relaxed andhypertension was noted. The incision was irrigated with normal saline and closed using interrupted 3-0 chromic sutures.  A dressing was applied. The patient was awakened from anesthesia and sent to the postanesthesia care unit in stable condition. All counts are correct per OR staff.

## 2014-04-20 NOTE — Transfer of Care (Signed)
Immediate Anesthesia Transfer of Care Note  Patient: KAREEM AUL  Procedure(s) Performed: Procedure(s) (LRB): EXAM UNDER ANESTHESIA  (N/A)  LATERAL INTERNAL SPHINCTEROTOMY (N/A)  Patient Location: PACU  Anesthesia Type: General  Level of Consciousness: awake, oriented, sedated and patient cooperative  Airway & Oxygen Therapy: Patient Spontanous Breathing and Patient connected to face mask oxygen  Post-op Assessment: Report given to PACU RN and Post -op Vital signs reviewed and stable  Post vital signs: Reviewed and stable  Complications: No apparent anesthesia complications

## 2014-04-20 NOTE — Anesthesia Procedure Notes (Signed)
Procedure Name: MAC Date/Time: 04/20/2014 8:37 AM Performed by: Wanita Chamberlain Pre-anesthesia Checklist: Timeout performed, Patient being monitored, Suction available, Emergency Drugs available and Patient identified Patient Re-evaluated:Patient Re-evaluated prior to inductionOxygen Delivery Method: Simple face mask Preoxygenation: Pre-oxygenation with 100% oxygen Intubation Type: IV induction Placement Confirmation: positive ETCO2 Dental Injury: Teeth and Oropharynx as per pre-operative assessment

## 2014-04-20 NOTE — Anesthesia Postprocedure Evaluation (Signed)
  Anesthesia Post-op Note  Patient: Marisa [name]  Procedure(s) Performed: Procedure(s) (LRB): EXAM UNDER ANESTHESIA  (N/A)  LATERAL INTERNAL SPHINCTEROTOMY (N/A)  Patient Location: PACU  Anesthesia Type: MAC  Level of Consciousness: awake and alert   Airway and Oxygen Therapy: Patient Spontanous Breathing  Post-op Pain: mild  Post-op Assessment: Post-op Vital signs reviewed, Patient's Cardiovascular Status Stable, Respiratory Function Stable, Patent Airway and No signs of Nausea or vomiting  Last Vitals:  Filed Vitals:   04/20/14 1015  BP: 121/89  Pulse: 108  Temp:   Resp: 24    Post-op Vital Signs: stable   Complications: No apparent anesthesia complications

## 2014-04-21 ENCOUNTER — Encounter (HOSPITAL_BASED_OUTPATIENT_CLINIC_OR_DEPARTMENT_OTHER): Payer: Self-pay | Admitting: General Surgery

## 2014-04-25 ENCOUNTER — Ambulatory Visit: Payer: 59 | Attending: Internal Medicine | Admitting: Physical Therapy

## 2014-04-25 DIAGNOSIS — M549 Dorsalgia, unspecified: Secondary | ICD-10-CM | POA: Diagnosis not present

## 2014-04-25 DIAGNOSIS — R269 Unspecified abnormalities of gait and mobility: Secondary | ICD-10-CM | POA: Diagnosis not present

## 2014-04-25 DIAGNOSIS — G8929 Other chronic pain: Secondary | ICD-10-CM | POA: Diagnosis not present

## 2014-04-25 DIAGNOSIS — R29898 Other symptoms and signs involving the musculoskeletal system: Secondary | ICD-10-CM | POA: Insufficient documentation

## 2014-04-26 ENCOUNTER — Encounter: Payer: Self-pay | Admitting: Physical Therapy

## 2014-04-26 ENCOUNTER — Telehealth: Payer: Self-pay | Admitting: Internal Medicine

## 2014-04-26 NOTE — Telephone Encounter (Signed)
Patient called stating that she went to [facility] and they stated that they would not be able to see her again because she has not had any progress with her therapy, the facility advised her to be referred to a neurologist as soon as possible for continuity of care. Please f/u with pt in regards to a referral.

## 2014-04-26 NOTE — Therapy (Signed)
[facility] [address] [place], [place], [address] Phone: [phone]   Fax:  [phone]  Physical Therapy Evaluation  Patient Details  Name: Marisa [name] MRN: [id number] Date of Birth: 1975/06/24 Referring Provider:  Tresa Garter, MD  Encounter Date: 04/25/2014      PT End of Session - 04/26/14 0818    Visit Number 1   Number of Visits 1  eval only due to unknown diagnosis at this time   Authorization Type [employer]   PT Start Time 1315   PT Stop Time 1400   PT Time Calculation (min) 45 min      Past Medical History  Diagnosis Date  . Chronic headaches   . Anemia   . Gait instability   . IBS (irritable bowel syndrome)   . History of ectopic pregnancy     2009-  S/P LEFT SALPINGECTOMY  . History of cardiac arrest     during SVD 1992  . Type 2 diabetes mellitus   . History of adenomatous polyp of colon   . Anal pain     chronic  . Mild obstructive sleep apnea     study 03-20-2014  no cpap recommended  . Lumbar stenosis L4 -- L5 with bulging disk    w/ right leg weakness/ decreased mobility  . Chronic low back pain   . Cyst of right ovary   . Asthma     exacerbation 02-28-2014 and 02-23-2014 secondary to Rhinovirus  . Anxiety   . History of panic attacks   . Weakness of right leg     FROM BACK PROBLEM PER PT  . Difficult intravenous access     PER PT NEEDS PICC LINE    Past Surgical History  Procedure Laterality Date  . Unilateral salpingectomy  2009    laparotomy left salpingectomy-- ectopic preg.  . Vaginal hysterectomy N/A 01/06/2013    Procedure: HYSTERECTOMY VAGINAL;  Surgeon: Osborne Oman, MD;  Location: [facility];  Service: Gynecology;  Laterality: N/A;  . Colonoscopy Left 04/29/2013    Procedure: COLONOSCOPY;  Surgeon: Arta Silence, MD;  Location: [facility];  Service: Endoscopy;  Laterality: Left;  . Flexible sigmoidoscopy N/A 11/09/2013    Procedure: FLEXIBLE  SIGMOIDOSCOPY;  Surgeon: Arta Silence, MD;  Location: [facility];  Service: Endoscopy;  Laterality: N/A;  . Transthoracic echocardiogram  12-30-2012    mild LVH/  ef 55-60%  . Laparoscopic cholecystectomy  2005  . Evaluation under anesthesia with fistulectomy N/A 04/20/2014    Procedure: EXAM UNDER ANESTHESIA ;  Surgeon: Leighton Ruff, MD;  Location: [facility];  Service: General;  Laterality: N/A;  . Sphincterotomy N/A 04/20/2014    Procedure:  LATERAL INTERNAL SPHINCTEROTOMY;  Surgeon: Leighton Ruff, MD;  Location: [facility];  Service: General;  Laterality: N/A;    LMP 11/27/2012  Visit Diagnosis:  Right leg weakness - Plan: PT plan of care cert/re-cert  Abnormality of gait - Plan: PT plan of care cert/re-cert  Back pain, chronic - Plan: PT plan of care cert/re-cert      Subjective Assessment - 04/26/14 0804    Symptoms Pt. became ill in Dec. 2015 with vomiting blood - diagnosed with lung infection and asthma; was hospitalized for 2 weeks - discharged home and then walking became difficult so pt. returned to [facility] for 1 week; pt. cont. to get weaker; pt. reports  falling every day; awaiting on neurology referral - has not heard from them  as of this time; pt. states she has significantly gotten weaker in past month; presents today in manual wheelchair but states she uses a rollator in the home but falls daily; pt. also reports she "shakes" upon initial standing                        Pertinent History asthma; irregular heart beat; allergies   Diagnostic tests MRI    Patient Stated Goals improve strength   Currently in Pain? Yes   Pain Score 8    Pain Location Back   Pain Orientation Posterior;Anterior  radiating down into R thigh   Pain Descriptors / Indicators Shooting;Stabbing   Pain Type Chronic pain   Pain Onset More than a month ago   Pain Frequency Constant   Aggravating Factors  weight- bearing or prolonged sitting   Pain Relieving  Factors medication; elevate R leg helps   Effect of Pain on Daily Activities unable to work due to inability to sit for prolonged periods          [facility] PT Assessment - 04/26/14 0001    Assessment   Medical Diagnosis LE weakness; Falls; Gait abnormality   Onset Date --  Dec. 2015   Balance Screen   Has the patient fallen in the past 6 months Yes   How many times? 30+   Has the patient had a decrease in activity level because of a fear of falling?  Yes   Is the patient reluctant to leave their home because of a fear of falling?  Yes   Home Environment   Type of 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 Unable to stand unsupported and required mod assist to transfer from wheelchair to mat table with use of RW; pt. Had  Spasms/tremors in RLE upon standing - will result in fall; pt. Unable to weight bear on RLE due to c/o pain and quad weakness Which will result in buckling  Recommend further diagnostic work up to determine etiology of weakness - which pt. Says has progressively worsened in past month - PT is not recommended until diagnosis or further testing has been completed due to not wanting to do anything that may cause  Pt's condition to worsen Pt. Verbalized understanding of need to follow up with MD immediately                     PT Education - 04/26/14 0817    Education provided Yes   Education Details recommended pt. contact MD regarding significant decline in functional  status and incr. c/o pain; exercise at this time may not be beneficial due to unknown etiology of weakness   Person(s) Educated Patient   Methods Explanation   Comprehension Verbalized understanding                    Plan - 04/26/14 4944  Clinical Impression Statement Pt. has some signs and symptoms, including c/o urinary incontinence, that may be indicative of nerve or spinal cord compression; pt. unable to actively move, or tolerate PROM, of RLE without expressing severe pain;  no definitive diagnosis known at this time; pt. has spasms/tremors in trunk and RLE upon standing; pt would benefit from further diagnostic  work up to determine etiology of weakness; PT may be contraindicated pending etiology of weakness    Rehab Potential --  N/A - eval only until diagnosis achieved   Recommended Other Services follow up with MD immediately for further diagnostic work up - to rule out nerve or spinal cord compression   Consulted and Agree with Plan of Care Patient         Problem List Patient Active Problem List   Diagnosis Date Noted  . Gait disturbance 04/11/2014  . Depression 03/27/2014  . Falls 03/27/2014  . OSA (obstructive sleep apnea) 03/06/2014  . Insomnia 03/06/2014  . Anxiety   . History of cardiac arrest   . Acute bronchitis   . Asthma exacerbation 02/20/2014  . Well controlled type 2 diabetes mellitus 02/20/2014  . Sore throat 02/20/2014  . Chest pain 02/20/2014  . Tachycardia 02/20/2014  . Diabetes mellitus without complication 24/82/5003  . Pelvic pain 07/25/2013  . Rectal bleeding 07/25/2013  . Persistent vomiting 04/27/2013  . Rectal bleed 04/26/2013  . Atypical chest pain 04/26/2013  . Abdominal pain 04/26/2013  . S/P Total vaginal hysterectomy on 01/06/13 01/06/2013  . Dyspnea 09/07/2012  . Extrinsic asthma, unspecified 07/30/2012  . Anemia 07/30/2012  . Current smoker 07/30/2012    Alda Lea, PT 04/26/2014, 8:31 AM  [facility] [address] [place], [place], [address] Phone: [phone]   Fax:  [phone]

## 2014-04-27 NOTE — Telephone Encounter (Signed)
Patient called stating that she went to [facility] and they stated that they would not be able to see her again because she has not had any progress with her therapy, the facility advised her to be referred to a neurologist as soon as possible for continuity of care. Please f/u with pt in regards to a referral.

## 2014-05-04 ENCOUNTER — Other Ambulatory Visit: Payer: Self-pay | Admitting: Internal Medicine

## 2014-05-04 ENCOUNTER — Ambulatory Visit (INDEPENDENT_AMBULATORY_CARE_PROVIDER_SITE_OTHER): Payer: 59 | Admitting: Psychiatry

## 2014-05-04 ENCOUNTER — Encounter: Payer: Self-pay | Admitting: Internal Medicine

## 2014-05-04 ENCOUNTER — Telehealth: Payer: Self-pay | Admitting: Emergency Medicine

## 2014-05-04 ENCOUNTER — Encounter (HOSPITAL_COMMUNITY): Payer: Self-pay | Admitting: Psychiatry

## 2014-05-04 VITALS — BP 134/96 | HR 108 | Ht 65.0 in | Wt 247.8 lb

## 2014-05-04 DIAGNOSIS — F411 Generalized anxiety disorder: Secondary | ICD-10-CM

## 2014-05-04 DIAGNOSIS — F17210 Nicotine dependence, cigarettes, uncomplicated: Secondary | ICD-10-CM

## 2014-05-04 DIAGNOSIS — F4001 Agoraphobia with panic disorder: Secondary | ICD-10-CM

## 2014-05-04 DIAGNOSIS — R531 Weakness: Secondary | ICD-10-CM

## 2014-05-04 DIAGNOSIS — F418 Other specified anxiety disorders: Secondary | ICD-10-CM

## 2014-05-04 DIAGNOSIS — F4010 Social phobia, unspecified: Secondary | ICD-10-CM

## 2014-05-04 DIAGNOSIS — F333 Major depressive disorder, recurrent, severe with psychotic symptoms: Secondary | ICD-10-CM

## 2014-05-04 DIAGNOSIS — F4310 Post-traumatic stress disorder, unspecified: Secondary | ICD-10-CM | POA: Insufficient documentation

## 2014-05-04 HISTORY — DX: Major depressive disorder, recurrent, severe with psychotic symptoms: F33.3

## 2014-05-04 HISTORY — DX: Social phobia, unspecified: F40.10

## 2014-05-04 HISTORY — DX: Generalized anxiety disorder: F41.1

## 2014-05-04 HISTORY — DX: Post-traumatic stress disorder, unspecified: F43.10

## 2014-05-04 HISTORY — DX: Nicotine dependence, cigarettes, uncomplicated: F17.210

## 2014-05-04 HISTORY — DX: Agoraphobia with panic disorder: F40.01

## 2014-05-04 MED ORDER — LITHIUM CARBONATE ER 300 MG PO TBCR: 300.0000 mg | EXTENDED_RELEASE_TABLET | Freq: Every day | ORAL | Status: DC

## 2014-05-04 MED ORDER — CITALOPRAM HYDROBROMIDE 20 MG PO TABS: 20.0000 mg | ORAL_TABLET | Freq: Every day | ORAL | Status: DC
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
## 2014-05-04 NOTE — Telephone Encounter (Signed)
Telephone Encounter    Expand All Collapse All   Patient called stating that she went to [facility] and they stated that they would not be able to see her again because she has not had any progress with her therapy, the facility advised her to be referred to a neurologist as soon as possible for continuity of care. Please f/u with pt in regards to a referral.         Pt is requesting Neurology referral. Do you want me to place referral?

## 2014-05-09 ENCOUNTER — Encounter: Payer: Self-pay | Admitting: Internal Medicine

## 2014-05-09 ENCOUNTER — Ambulatory Visit: Payer: 59 | Attending: Internal Medicine | Admitting: Internal Medicine

## 2014-05-09 VITALS — BP 115/78 | HR 93 | Temp 97.6°F | Resp 16 | Ht 65.0 in | Wt 241.0 lb

## 2014-05-09 DIAGNOSIS — E119 Type 2 diabetes mellitus without complications: Secondary | ICD-10-CM | POA: Diagnosis not present

## 2014-05-09 DIAGNOSIS — R262 Difficulty in walking, not elsewhere classified: Secondary | ICD-10-CM | POA: Diagnosis not present

## 2014-05-09 DIAGNOSIS — G43909 Migraine, unspecified, not intractable, without status migrainosus: Secondary | ICD-10-CM | POA: Diagnosis present

## 2014-05-09 DIAGNOSIS — G43809 Other migraine, not intractable, without status migrainosus: Secondary | ICD-10-CM

## 2014-05-09 HISTORY — DX: Other migraine, not intractable, without status migrainosus: G43.809

## 2014-05-09 MED ORDER — SUMATRIPTAN SUCCINATE 50 MG PO TABS: 50.0000 mg | ORAL_TABLET | Freq: Once | ORAL | Status: DC

## 2014-05-09 NOTE — Progress Notes (Signed)
Patient ID: Marisa [name], female   DOB: 1975/11/04, 39 y.o.   MRN: [id number]   Marisa [name], is a 39 y.o. female  [id number]  [id number]  DOB - [birth year]  Chief Complaint  Patient presents with  . Follow-up  . Re-evaluation    Mobility         Subjective:   Marisa [name] is a 39 y.o. female here today for a follow up visit. Patient with medical history as listed below, recently admitted to hospital for acute asthma exacerbation and discharged, developed gait abnormality which was initially thought to be from muscle weakness, patient was sent to rehabilitation for physical therapy and occupational therapy, patient was discharged from rehabilitation with no improvement, she is advised to be referred to neurologist for further evaluation and management. Patient claims her husband died a few weeks ago and she has been depressed ever since, she currently follows with psychiatrist and a behavioral therapist and she was started on lithium and Celexa. Patient claims she is slowly getting better from depression standpoint but she still unable to walk. She denies any suicidal ideation or thoughts. Both legs goes into spasm and tremble when she intends to get up, she is unable to stand unsupported, she uses wheelchair. MRI of the lumbar spine done recently showed no significant abnormality. Patient has No headache, No chest pain, No abdominal pain - No Nausea, No Cough - SOB.  Problem  Migraine Variant With Headache  Unable to Ambulate    ALLERGIES: Allergies  Allergen Reactions  . Penicillins Anaphylaxis  . Shellfish Allergy Anaphylaxis  . Other     Mushroom-- swelling throat, eyes Animals with fur -- swelling throat, eyes    PAST MEDICAL HISTORY: Past Medical History  Diagnosis Date  . Chronic headaches   . Anemia   . Gait instability   . IBS (irritable bowel syndrome)   . History of ectopic pregnancy     2009-  S/P LEFT SALPINGECTOMY  . History of cardiac arrest      during SVD 1992  . Type 2 diabetes mellitus   . History of adenomatous polyp of colon   . Anal pain     chronic  . Mild obstructive sleep apnea     study 03-20-2014  no cpap recommended  . Lumbar stenosis L4 -- L5 with bulging disk    w/ right leg weakness/ decreased mobility  . Chronic low back pain   . Cyst of right ovary   . Asthma     exacerbation 02-28-2014 and 02-23-2014 secondary to Rhinovirus  . Anxiety   . History of panic attacks   . Weakness of right leg     FROM BACK PROBLEM PER PT  . Difficult intravenous access     PER PT NEEDS PICC LINE    MEDICATIONS AT HOME: Prior to Admission medications   Medication Sig Start Date End Date Taking? Authorizing Provider  albuterol (PROVENTIL HFA;VENTOLIN HFA) 108 (90 BASE) MCG/ACT inhaler Inhale 1-2 puffs into the lungs every 6 (six) hours as needed for wheezing or shortness of breath. 02/23/14  Yes Robbie Lis, MD  budesonide-formoterol [facility]) 160-4.5 MCG/ACT inhaler Inhale 2 puffs into the lungs 2 (two) times daily. 04/17/14  Yes Kathee Delton, MD  citalopram (CELEXA) 20 MG tablet Take 1 tablet (20 mg total) by mouth daily. 05/04/14 05/04/15 Yes Charlcie Cradle, MD  ipratropium-albuterol (DUONEB) 0.5-2.5 (3) MG/3ML SOLN Take 3 mLs by nebulization every 6 (six) hours as needed. 02/23/14  Yes Alma  Vivi Ferns, MD  lithium carbonate (LITHOBID) 300 MG CR tablet Take 1 tablet (300 mg total) by mouth at bedtime. 05/04/14  Yes Charlcie Cradle, MD  metFORMIN (GLUCOPHAGE) 500 MG tablet Take 1 tablet (500 mg total) by mouth 2 (two) times daily with a meal. 10/10/13  Yes Lance Bosch, NP  HYDROcodone-acetaminophen (NORCO/VICODIN) 5-325 MG per tablet Take 1 tablet by mouth every 6 (six) hours as needed. Patient not taking: Reported on 9/56/2130 09/23/55   Leighton Ruff, MD  SUMAtriptan (IMITREX) 50 MG tablet Take 1 tablet (50 mg total) by mouth once. May repeat in 2 hours if headache persists or recurs. 05/09/14   Tresa Garter, MD      Objective:   Filed Vitals:   05/09/14 1021  BP: 115/78  Pulse: 93  Temp: 97.6 F (36.4 C)  TempSrc: Oral  Resp: 16  Height: 5\' 5"  (1.651 m)  Weight: 241 lb (109.317 kg)  SpO2: 97%    Exam General appearance : Awake, alert, not in any distress. Speech Clear. Not toxic looking HEENT: Atraumatic and Normocephalic, pupils equally reactive to light and accomodation Neck: supple, no JVD. No cervical lymphadenopathy.  Chest:Good air entry bilaterally, no added sounds  CVS: S1 S2 regular, no murmurs.  Abdomen: Bowel sounds present, Non tender and not distended with no gaurding, rigidity or rebound. Extremities: B/L Lower Ext shows no edema, both legs are warm to touch Neurology: Awake alert, and oriented X 3, CN II-XII intact, Non focal Skin:No Rash  Data Review Lab Results  Component Value Date   HGBA1C 6.2* 02/20/2014   HGBA1C 6.5* 09/29/2013     Assessment & Plan   1. Migraine variant with headache Refill - SUMAtriptan (IMITREX) 50 MG tablet; Take 1 tablet (50 mg total) by mouth once. May repeat in 2 hours if headache persists or recurs.  Dispense: 20 tablet; Refill: 0  2. Unable to ambulate  Patient will be referred to a neurologist for evaluation and possible management Patient is to continue following with psychiatrist a behavioral therapist Continue antidepressants and social support Patient is excused from all duties for another 4 weeks until she has neurology evaluation.   Return in about 4 weeks (around 06/06/2014), or if symptoms worsen or fail to improve, for Hemoglobin A1C and Follow up, DM, Routine Follow Up.  The patient was given clear instructions to go to ER or return to medical center if symptoms don't improve, worsen or new problems develop. The patient verbalized understanding. The patient was told to call to get lab results if they haven't heard anything in the next week.   This note has been created with Engineer, agricultural. Any transcriptional errors are unintentional.    Angelica Chessman, MD, [place], [place], [place], [place] and [facility] [place], [place]   05/09/2014, 11:17 AM

## 2014-05-09 NOTE — Progress Notes (Signed)
Pt comes in to re-evaluate mobility  Pt was following Sports Med for PT/OT without improvement in therapy Pt gait is unsteady with c/o intermit at home falls  Instructed to f/u with PCP for possible Neurology referral Requesting refill Imitrex

## 2014-05-11 ENCOUNTER — Telehealth: Payer: Self-pay | Admitting: Pulmonary Disease

## 2014-05-11 NOTE — Telephone Encounter (Signed)
Received PA request for Symbicort 160/4.5  PA number is [phone] Pt ID number [id number]  Pt insurance prefers alvesco, advair, dulera or qvar  Please advise if you want to switch or proceed with PA Thanks!

## 2014-05-13 NOTE — Telephone Encounter (Signed)
Switch to dulera 100, 2 bid

## 2014-05-15 NOTE — Telephone Encounter (Signed)
lmtcb x1 for pt. 

## 2014-05-16 ENCOUNTER — Ambulatory Visit: Payer: 59 | Admitting: Pulmonary Disease

## 2014-05-18 NOTE — Telephone Encounter (Signed)
LMTCB

## 2014-05-23 ENCOUNTER — Ambulatory Visit (INDEPENDENT_AMBULATORY_CARE_PROVIDER_SITE_OTHER): Payer: 59 | Admitting: Diagnostic Neuroimaging

## 2014-05-23 ENCOUNTER — Encounter: Payer: Self-pay | Admitting: Diagnostic Neuroimaging

## 2014-05-23 VITALS — BP 138/80 | HR 90 | Ht 65.0 in

## 2014-05-23 DIAGNOSIS — R269 Unspecified abnormalities of gait and mobility: Secondary | ICD-10-CM | POA: Diagnosis not present

## 2014-05-23 DIAGNOSIS — R29898 Other symptoms and signs involving the musculoskeletal system: Secondary | ICD-10-CM

## 2014-05-23 DIAGNOSIS — M79606 Pain in leg, unspecified: Secondary | ICD-10-CM

## 2014-05-23 NOTE — Progress Notes (Signed)
[facility]  PATIENT: Marisa [name] DOB: November 14, 1975  REFERRING CLINICIAN: Jedege HISTORY FROM: patient and son REASON FOR VISIT: new consult    HISTORICAL  CHIEF COMPLAINT:  Chief Complaint  Patient presents with  . New Evaluation    generalized weakness; worsening since hospitilzation     HISTORY OF PRESENT ILLNESS:   39 year old right-handed female with asthma, borderline diabetes, depression, anxiety, migraine, here for evaluation of lower extremity weakness. Patient reports shortness of breath and asthma attack in January 2016. During hospital relation she had severe weakness in her lower extremity. She went through rehabilitation without recovery. Over time symptoms getting worse and lower extremity is. She describes weakness, out of proportion to pain in her legs. She has some intermittent numbness and tingling in her toes. She's noticed some weakness in her hands. Sometimes she has shaking movements in her body.  Patient also under significant stress and grieving, related to sudden unexpected death of her 87 year old husband in February 2016, due to massive heart attack.   REVIEW OF SYSTEMS: Full 14 system review of systems performed and notable only for weight loss fatigue chest pain palpitation ringing in ears moles diarrhea shortness of breath blurred vision feeling hot increased thirst joint pain depression anxiety allergies runny nose not enough sleep decreased energy she has not attempted to 6 activities racing thoughts dizziness headache numbness weakness insomnia.  ALLERGIES: Allergies  Allergen Reactions  . Penicillins Anaphylaxis  . Shellfish Allergy Anaphylaxis  . Other     Mushroom-- swelling throat, eyes Animals with fur -- swelling throat, eyes    HOME MEDICATIONS: Outpatient Prescriptions Prior to Visit  Medication Sig Dispense Refill  . albuterol (PROVENTIL HFA;VENTOLIN HFA) 108 (90 BASE) MCG/ACT inhaler Inhale 1-2 puffs into  the lungs every 6 (six) hours as needed for wheezing or shortness of breath. 1 Inhaler 1  . budesonide-formoterol (SYMBICORT) 160-4.5 MCG/ACT inhaler Inhale 2 puffs into the lungs 2 (two) times daily. 1 Inhaler 12  . citalopram (CELEXA) 20 MG tablet Take 1 tablet (20 mg total) by mouth daily. 30 tablet 1  . ipratropium-albuterol (DUONEB) 0.5-2.5 (3) MG/3ML SOLN Take 3 mLs by nebulization every 6 (six) hours as needed. 360 mL 0  . lithium carbonate (LITHOBID) 300 MG CR tablet Take 1 tablet (300 mg total) by mouth at bedtime. 30 tablet 1  . metFORMIN (GLUCOPHAGE) 500 MG tablet Take 1 tablet (500 mg total) by mouth 2 (two) times daily with a meal. 180 tablet 3  . SUMAtriptan (IMITREX) 50 MG tablet Take 1 tablet (50 mg total) by mouth once. May repeat in 2 hours if headache persists or recurs. 20 tablet 0  . HYDROcodone-acetaminophen (NORCO/VICODIN) 5-325 MG per tablet Take 1 tablet by mouth every 6 (six) hours as needed. (Patient not taking: Reported on 05/09/2014) 20 tablet 0   No facility-administered medications prior to visit.    PAST MEDICAL HISTORY: Past Medical History  Diagnosis Date  . Chronic headaches   . Anemia   . Gait instability   . IBS (irritable bowel syndrome)   . History of ectopic pregnancy     2009-  S/P LEFT SALPINGECTOMY  . History of cardiac arrest     during SVD 1992  . Type 2 diabetes mellitus   . History of adenomatous polyp of colon   . Anal pain     chronic  . Mild obstructive sleep apnea     study 03-20-2014  no cpap recommended  . Lumbar stenosis L4 --  L5 with bulging disk    w/ right leg weakness/ decreased mobility  . Chronic low back pain   . Cyst of right ovary   . Asthma     exacerbation 02-28-2014 and 02-23-2014 secondary to Rhinovirus  . Anxiety   . History of panic attacks   . Weakness of right leg     FROM BACK PROBLEM PER PT  . Difficult intravenous access     PER PT NEEDS PICC LINE    PAST SURGICAL HISTORY: Past Surgical History    Procedure Laterality Date  . Unilateral salpingectomy  2009    laparotomy left salpingectomy-- ectopic preg.  . Vaginal hysterectomy N/A 01/06/2013    Procedure: HYSTERECTOMY VAGINAL;  Surgeon: Osborne Oman, MD;  Location: [facility];  Service: Gynecology;  Laterality: N/A;  . Colonoscopy Left 04/29/2013    Procedure: COLONOSCOPY;  Surgeon: Arta Silence, MD;  Location: [facility];  Service: Endoscopy;  Laterality: Left;  . Flexible sigmoidoscopy N/A 11/09/2013    Procedure: FLEXIBLE SIGMOIDOSCOPY;  Surgeon: Arta Silence, MD;  Location: [facility];  Service: Endoscopy;  Laterality: N/A;  . Transthoracic echocardiogram  12-30-2012    mild LVH/  ef 55-60%  . Laparoscopic cholecystectomy  2005  . Evaluation under anesthesia with fistulectomy N/A 04/20/2014    Procedure: EXAM UNDER ANESTHESIA ;  Surgeon: Leighton Ruff, MD;  Location: [facility];  Service: General;  Laterality: N/A;  . Sphincterotomy N/A 04/20/2014    Procedure:  LATERAL INTERNAL SPHINCTEROTOMY;  Surgeon: Leighton Ruff, MD;  Location: [facility];  Service: General;  Laterality: N/A;  . Abdominal hysterectomy      FAMILY HISTORY: Family History  Problem Relation Age of Onset  . Hypertension Mother   . Diabetes Mother   . Allergies Mother   . Heart disease Mother   . Cancer Father   . Hyperlipidemia Father   . Hypertension Father   . Heart disease Maternal Grandmother   . Schizophrenia Sister   . Bipolar disorder Sister   . Bipolar disorder Brother   . Bipolar disorder Sister     SOCIAL HISTORY:  History   Social History  . Marital Status: Widowed    Spouse Name: N/A  . Number of Children: 1  . Years of Education: N/A   Occupational History  . SITTER    Social History Main Topics  . Smoking status: Current Every Day Smoker -- 0.25 packs/day for 11 years    Types: Cigarettes    Last Attempt to Quit: 02/25/2014  . Smokeless tobacco: Never Used  . Alcohol Use: Yes  .  Drug Use: No  . Sexual Activity: Not on file   Other Topics Concern  . Not on file   Social History Narrative   Lives with sister   Drinks no caffeine     PHYSICAL EXAM  Filed Vitals:   05/23/14 1055 05/23/14 1056  BP:  138/80  Pulse:  90  Height: 5\' 5"  (1.651 m)     Body mass index is 0.00 kg/(m^2).  No exam data present  No flowsheet data found.  GENERAL EXAM: Patient is in no distress; well developed, nourished and groomed; neck is supple  CARDIOVASCULAR: Regular rate and rhythm, no murmurs, no carotid bruits  NEUROLOGIC: MENTAL STATUS: awake, alert, oriented to person, place and time, recent and remote memory intact, normal attention and concentration, language fluent, comprehension intact, naming intact, fund of knowledge appropriate CRANIAL NERVE: no papilledema on fundoscopic exam, pupils equal and  reactive to light, visual fields full to confrontation, extraocular muscles intact, no nystagmus, facial sensation and strength symmetric, hearing intact, palate elevates symmetrically, uvula midline, shoulder shrug symmetric, tongue midline. MOTOR: normal bulk and tone, full strength in the BUE EXCEPT FINGER ABDUCTION 3; BLE (RIGHT HF 2, LEFT HF 3, KNEE EXT 4, KNEE FLEX 3, RIGHT DF 2, LEFT DF 3, PF 4); DECR TONE IN BLE; FLUCTUATING GIVE WAY WEAKNESS SENSORY: DECR PP, TEMP AND VIB IN FEET; RIGHT WORSE THAN LEFT COORDINATION: finger-nose-finger SLOW REFLEXES: deep tendon reflexes present and symmetric; BUE 2, KNEES 3, RIGHT ANKLE TRACE, LEFT ANKLE 1; MUTE TOES GAIT/STATION: CANNOT STAND UNASSISTED; IN WHEEL CHAIR    DIAGNOSTIC DATA (LABS, IMAGING, TESTING) - I reviewed patient records, labs, notes, testing and imaging myself where available.  Lab Results  Component Value Date   WBC 25.0* 03/02/2014   HGB 13.9 04/20/2014   HCT 41.0 04/20/2014   MCV 95.8 03/02/2014   PLT 395 03/02/2014      Component Value Date/Time   NA 140 04/20/2014 0742   K 3.7 04/20/2014  0742   CL 102 04/20/2014 0742   CO2 23 04/16/2014 1043   GLUCOSE 114* 04/20/2014 0742   BUN 6 04/20/2014 0742   CREATININE 0.60 04/20/2014 0742   CREATININE 0.60 09/29/2013 1028   CALCIUM 9.2 04/16/2014 1043   PROT 6.7 04/16/2014 1043   ALBUMIN 3.6 04/16/2014 1043   AST 39* 04/16/2014 1043   ALT 63* 04/16/2014 1043   ALKPHOS 59 04/16/2014 1043   BILITOT 0.6 04/16/2014 1043   GFRNONAA >90 04/16/2014 1043   GFRNONAA >89 09/29/2013 1028   GFRAA >90 04/16/2014 1043   GFRAA >89 09/29/2013 1028   Lab Results  Component Value Date   CHOL 170 09/29/2013   HDL 33* 09/29/2013   LDLCALC 93 09/29/2013   TRIG 219* 09/29/2013   CHOLHDL 5.2 09/29/2013   Lab Results  Component Value Date   HGBA1C 6.2* 02/20/2014   No results found for: VITAMINB12 Lab Results  Component Value Date   TSH 0.841 09/29/2013    04/07/14 MRI LUMBAR SPINE [I reviewed images myself and agree with interpretation. -VRP]  1. Desiccation and mild lateral bulging at L4-5 without focal stenosis. 2. The other disc levels are normal. 3. No focal stenosis or foraminal narrowing to explain the patient's symptoms.   ASSESSMENT AND PLAN  39 y.o. year old female here with new onset lower extremity weakness in January 2016 in setting of severe asthma attack exacerbation, now with progressive weakness and lower extremity's. Mild weakness in hands as well. Neurologic examination notable for weakness in the hands, lower extremities, hyperreflexia at the knees, decreased sensation in the feet. Will pursue further workup.  Ddx: diabetic neuropathy, diabetic lumbosacral plexopathy, spinal cord lesion (autoimmune, inflamm, compressive), deconditioning, myopathy  PLAN: - MRI cervical and thoracic spine; if negative, then will check MRI brain and EMG/NCS  Orders Placed This Encounter  Procedures  . MR Cervical Spine Wo Contrast  . MR Thoracic Spine Wo Contrast   Return in about 6 weeks (around 07/04/2014).  I reviewed  images, labs, notes, records myself. I summarized findings and reviewed with patient, for this high risk condition (paraparesis, hyperreflexia, possible myelopathy, progressively worsening) requiring high complexity decision making.   Penni Bombard, MD 06/22/3873, 64:33 AM Certified in Neurology, Neurophysiology and Neuroimaging  [facility] [address], [place], [place] [address] [phone]

## 2014-05-25 ENCOUNTER — Ambulatory Visit: Payer: Self-pay | Admitting: Internal Medicine

## 2014-05-31 ENCOUNTER — Ambulatory Visit: Payer: 59

## 2014-05-31 ENCOUNTER — Telehealth: Payer: Self-pay | Admitting: Diagnostic Neuroimaging

## 2014-05-31 NOTE — Telephone Encounter (Signed)
patient no show for mri

## 2014-06-01 ENCOUNTER — Other Ambulatory Visit: Payer: Self-pay | Admitting: Pulmonary Disease

## 2014-06-01 DIAGNOSIS — R0600 Dyspnea, unspecified: Secondary | ICD-10-CM

## 2014-06-02 ENCOUNTER — Ambulatory Visit: Payer: 59 | Admitting: Pulmonary Disease

## 2014-06-06 ENCOUNTER — Ambulatory Visit: Payer: Self-pay | Admitting: Internal Medicine

## 2014-06-12 NOTE — Telephone Encounter (Signed)
Encounter closed

## 2014-06-13 ENCOUNTER — Ambulatory Visit (HOSPITAL_COMMUNITY): Payer: Self-pay | Admitting: Clinical

## 2014-06-23 ENCOUNTER — Telehealth: Payer: Self-pay | Admitting: Diagnostic Neuroimaging

## 2014-06-23 NOTE — Telephone Encounter (Signed)
Called pt to remind her of her appt 5/10 and she stated that she has not had her MRI yet.  She stated she no-showed because she fell and didn't have a phone or a way to call us.  I advised that the MRI was probably supposed to be done prior to the appt so we may need to reschedule.  She wants to know what she needs to do.

## 2014-06-26 ENCOUNTER — Telehealth: Payer: Self-pay | Admitting: *Deleted

## 2014-06-26 NOTE — Telephone Encounter (Signed)
Called the pt and spoke with her about the incident. She told me that she missed her MRI due to a fall and knew that Dr. Leta Baptist wanted her to have her MRI before her follow up appt. I told her that I would cancel the appt for 06/27/14 and reach out to our MRI scheduler Larene Beach and have her to try and reschedule. I also told her that she would need to call me back once the appt was scheduled to reschedule her follow up with Dr. Mamie Nick. She stated a thanks and an understanding

## 2014-06-27 ENCOUNTER — Ambulatory Visit: Payer: 59 | Admitting: Diagnostic Neuroimaging

## 2014-07-04 ENCOUNTER — Ambulatory Visit (INDEPENDENT_AMBULATORY_CARE_PROVIDER_SITE_OTHER): Payer: 59 | Admitting: Pulmonary Disease

## 2014-07-04 ENCOUNTER — Encounter: Payer: Self-pay | Admitting: Pulmonary Disease

## 2014-07-04 VITALS — BP 118/64 | HR 88 | Temp 98.4°F | Ht 65.0 in | Wt 249.0 lb

## 2014-07-04 DIAGNOSIS — J4520 Mild intermittent asthma, uncomplicated: Secondary | ICD-10-CM | POA: Diagnosis not present

## 2014-07-04 DIAGNOSIS — R0600 Dyspnea, unspecified: Secondary | ICD-10-CM

## 2014-07-04 LAB — PULMONARY FUNCTION TEST
DL/VA % pred: 132 %
DL/VA: 6.51 ml/min/mmHg/L
DLCO unc % pred: 87 %
DLCO unc: 22.32 ml/min/mmHg
FEF 25-75 Post: 2.74 L/sec
FEF 25-75 Pre: 2.13 L/sec
FEF2575-%Change-Post: 28 %
FEF2575-%Pred-Post: 94 %
FEF2575-%Pred-Pre: 73 %
FEV1-%Change-Post: 2 %
FEV1-%Pred-Post: 72 %
FEV1-%Pred-Pre: 70 %
FEV1-Post: 1.91 L
FEV1-Pre: 1.85 L
FEV1FVC-%Change-Post: 2 %
FEV1FVC-%Pred-Pre: 104 %
FEV6-%Change-Post: 0 %
FEV6-%Pred-Post: 67 %
FEV6-%Pred-Pre: 67 %
FEV6-Post: 2.12 L
FEV6-Pre: 2.11 L
FEV6FVC-%Pred-Post: 102 %
FEV6FVC-%Pred-Pre: 102 %
FVC-%Change-Post: 0 %
FVC-%Pred-Post: 66 %
FVC-%Pred-Pre: 65 %
FVC-Post: 2.12 L
FVC-Pre: 2.11 L
Post FEV1/FVC ratio: 90 %
Post FEV6/FVC ratio: 100 %
Pre FEV1/FVC ratio: 88 %
Pre FEV6/FVC Ratio: 100 %
RV % pred: 71 %
RV: 1.17 L
TLC % pred: 72 %
TLC: 3.74 L

## 2014-07-04 NOTE — Assessment & Plan Note (Signed)
The patient has a history of asthma as documented by a positive methacholine challenge test in 2014, not been seen recently and totally visit with our nurse practitioner. She was not using inhaled corticosteroids, and was started on Symbicort at the last visit. Unfortunately, her insurance did not cover this, and she has gone back to her albuterol nebulizer treatments 4-6 times a day. I have explained to her again the importance of treating airway inflammation, and this will require some type of inhaled corticosteroid. I think she finally understands this is the mainstay of therapy, and we'll therefore change her inhaler regimen to something that insurance will cover. Stressed to her the importance of aggressive weight loss and conditioning.

## 2014-07-04 NOTE — Progress Notes (Signed)
PFT done today. 

## 2014-07-04 NOTE — Patient Instructions (Signed)
Will start on dulera 100, 2 inhalations each am and pm everyday no matter how you feel.  Use spacer, and keep mouth rinsed. Do not use your albuterol except for rescue.  Let us know if you are needing everyday. Work on weight loss followup with Dr. Lake Bells in 72mos.

## 2014-07-04 NOTE — Progress Notes (Signed)
   Subjective:    Patient ID: Marisa [name], female    DOB: [birth year], 39 y.o.   MRN: [id number]  HPI The patient comes in today for follow-up of her known asthma. She had not been seen since 2012 after having a positive methacholine challenge test, and came in recently to see our nurse practitioner with persistent asthma symptoms. She was started on Symbicort, but the insurance company does not cover this. She has been using albuterol nebulizer treatments at least 4 times a day, and has not been on an inhaled corticosteroid.  She did have PFTs today that shows no airflow obstruction, mild restriction secondary to centripetal obesity, a normal diffusion capacity at 87% of predicted.  Review of Systems  Constitutional: Negative for fever and unexpected weight change.  HENT: Positive for congestion and postnasal drip. Negative for dental problem, ear pain, nosebleeds, rhinorrhea, sinus pressure, sneezing, sore throat and trouble swallowing.   Eyes: Negative for redness and itching.  Respiratory: Positive for cough, chest tightness, shortness of breath and wheezing.   Cardiovascular: Negative for palpitations and leg swelling.  Gastrointestinal: Negative for nausea and vomiting.  Genitourinary: Negative for dysuria.  Musculoskeletal: Negative for joint swelling.  Skin: Negative for rash.  Neurological: Negative for headaches.  Hematological: Does not bruise/bleed easily.  Psychiatric/Behavioral: Negative for dysphoric mood. The patient is not nervous/anxious.        Objective:   Physical Exam Obese female in no acute distress Nose without purulence or discharge noted Neck without lymphadenopathy or thyromegaly Chest with decreased depth of inspiration, but no wheezing or rhonchi noted Cardiac exam with regular rate and rhythm Lower extremities with 1+ edema, no cyanosis Alert and oriented, moves all 4 extremities.       Assessment & Plan:

## 2014-07-06 ENCOUNTER — Ambulatory Visit (HOSPITAL_COMMUNITY): Payer: Self-pay | Admitting: Psychiatry

## 2014-07-20 ENCOUNTER — Ambulatory Visit (INDEPENDENT_AMBULATORY_CARE_PROVIDER_SITE_OTHER): Payer: 59

## 2014-07-20 DIAGNOSIS — M79606 Pain in leg, unspecified: Secondary | ICD-10-CM | POA: Diagnosis not present

## 2014-07-20 DIAGNOSIS — R29898 Other symptoms and signs involving the musculoskeletal system: Secondary | ICD-10-CM | POA: Diagnosis not present

## 2014-07-20 DIAGNOSIS — R269 Unspecified abnormalities of gait and mobility: Secondary | ICD-10-CM | POA: Diagnosis not present

## 2014-07-25 ENCOUNTER — Encounter (HOSPITAL_COMMUNITY): Payer: Self-pay | Admitting: Emergency Medicine

## 2014-07-25 ENCOUNTER — Emergency Department (HOSPITAL_COMMUNITY)
Admission: EM | Admit: 2014-07-25 | Discharge: 2014-07-25 | Disposition: A | Discharge status: Home / Self Care | Payer: 59 | Attending: Emergency Medicine | Admitting: Emergency Medicine

## 2014-07-25 DIAGNOSIS — Z87891 Personal history of nicotine dependence: Secondary | ICD-10-CM | POA: Diagnosis not present

## 2014-07-25 DIAGNOSIS — Z86010 Personal history of colonic polyps: Secondary | ICD-10-CM | POA: Insufficient documentation

## 2014-07-25 DIAGNOSIS — Z8674 Personal history of sudden cardiac arrest: Secondary | ICD-10-CM | POA: Insufficient documentation

## 2014-07-25 DIAGNOSIS — Z8739 Personal history of other diseases of the musculoskeletal system and connective tissue: Secondary | ICD-10-CM | POA: Insufficient documentation

## 2014-07-25 DIAGNOSIS — R112 Nausea with vomiting, unspecified: Secondary | ICD-10-CM | POA: Diagnosis not present

## 2014-07-25 DIAGNOSIS — Z862 Personal history of diseases of the blood and blood-forming organs and certain disorders involving the immune mechanism: Secondary | ICD-10-CM | POA: Insufficient documentation

## 2014-07-25 DIAGNOSIS — Z79899 Other long term (current) drug therapy: Secondary | ICD-10-CM | POA: Insufficient documentation

## 2014-07-25 DIAGNOSIS — R197 Diarrhea, unspecified: Secondary | ICD-10-CM

## 2014-07-25 DIAGNOSIS — N8320 Unspecified ovarian cysts: Secondary | ICD-10-CM | POA: Insufficient documentation

## 2014-07-25 DIAGNOSIS — R630 Anorexia: Secondary | ICD-10-CM | POA: Diagnosis not present

## 2014-07-25 DIAGNOSIS — J45909 Unspecified asthma, uncomplicated: Secondary | ICD-10-CM | POA: Diagnosis not present

## 2014-07-25 DIAGNOSIS — R1012 Left upper quadrant pain: Secondary | ICD-10-CM | POA: Insufficient documentation

## 2014-07-25 DIAGNOSIS — Z8719 Personal history of other diseases of the digestive system: Secondary | ICD-10-CM | POA: Diagnosis not present

## 2014-07-25 DIAGNOSIS — G8929 Other chronic pain: Secondary | ICD-10-CM | POA: Insufficient documentation

## 2014-07-25 DIAGNOSIS — E119 Type 2 diabetes mellitus without complications: Secondary | ICD-10-CM | POA: Insufficient documentation

## 2014-07-25 DIAGNOSIS — R195 Other fecal abnormalities: Secondary | ICD-10-CM | POA: Diagnosis not present

## 2014-07-25 DIAGNOSIS — R109 Unspecified abdominal pain: Secondary | ICD-10-CM | POA: Diagnosis present

## 2014-07-25 DIAGNOSIS — F410 Panic disorder [episodic paroxysmal anxiety] without agoraphobia: Secondary | ICD-10-CM | POA: Insufficient documentation

## 2014-07-25 LAB — COMPREHENSIVE METABOLIC PANEL
ALT: 66 U/L — ABNORMAL HIGH (ref 14–54)
AST: 114 U/L — ABNORMAL HIGH (ref 15–41)
Albumin: 3.4 g/dL — ABNORMAL LOW (ref 3.5–5.0)
Alkaline Phosphatase: 68 U/L (ref 38–126)
Anion gap: 11 (ref 5–15)
BUN: 7 mg/dL (ref 6–20)
CO2: 22 mmol/L (ref 22–32)
Calcium: 8.4 mg/dL — ABNORMAL LOW (ref 8.9–10.3)
Chloride: 104 mmol/L (ref 101–111)
Creatinine, Ser: 0.54 mg/dL (ref 0.44–1.00)
GFR calc Af Amer: >60 mL/min (ref >60)
GFR calc non Af Amer: >60 mL/min (ref >60)
Glucose, Bld: 94 mg/dL (ref 65–99)
Potassium: 4.2 mmol/L (ref 3.5–5.1)
Sodium: 137 mmol/L (ref 135–145)
Total Bilirubin: 0.3 mg/dL (ref 0.3–1.2)
Total Protein: 6.9 g/dL (ref 6.5–8.1)

## 2014-07-25 LAB — CBC WITH DIFFERENTIAL/PLATELET
Basophils Absolute: 0 10*3/uL (ref 0.0–0.1)
Basophils Relative: 0 % (ref 0–1)
Eosinophils Absolute: 0.1 10*3/uL (ref 0.0–0.7)
Eosinophils Relative: 2 % (ref 0–5)
HCT: 41.4 % (ref 36.0–46.0)
Hemoglobin: 13.8 g/dL (ref 12.0–15.0)
Lymphocytes Relative: 23 % (ref 12–46)
Lymphs Abs: 1.2 10*3/uL (ref 0.7–4.0)
MCH: 32.3 pg (ref 26.0–34.0)
MCHC: 33.3 g/dL (ref 30.0–36.0)
MCV: 97 fL (ref 78.0–100.0)
Monocytes Absolute: 0.2 10*3/uL (ref 0.1–1.0)
Monocytes Relative: 3 % (ref 3–12)
Neutro Abs: 3.8 10*3/uL (ref 1.7–7.7)
Neutrophils Relative %: 72 % (ref 43–77)
Platelets: 370 10*3/uL (ref 150–400)
RBC: 4.27 MIL/uL (ref 3.87–5.11)
RDW: 13.1 % (ref 11.5–15.5)
WBC: 5.3 10*3/uL (ref 4.0–10.5)

## 2014-07-25 LAB — URINALYSIS, ROUTINE W REFLEX MICROSCOPIC
Bilirubin Urine: NEGATIVE
Glucose, UA: NEGATIVE mg/dL
Hgb urine dipstick: NEGATIVE
Ketones, ur: NEGATIVE mg/dL
Leukocytes, UA: NEGATIVE
Nitrite: NEGATIVE
Protein, ur: NEGATIVE mg/dL
Specific Gravity, Urine: 1.017 (ref 1.005–1.030)
Urobilinogen, UA: 0.2 mg/dL (ref 0.0–1.0)
pH: 5 (ref 5.0–8.0)

## 2014-07-25 LAB — LIPASE, BLOOD: Lipase: 18 U/L — ABNORMAL LOW (ref 22–51)

## 2014-07-25 MED ORDER — ONDANSETRON HCL 4 MG/2ML IJ SOLN
4.0000 mg | Freq: Once | INTRAMUSCULAR | Status: AC
  Administered 2014-07-25: 4 mg via INTRAVENOUS
  Filled 2014-07-25: qty 2

## 2014-07-25 MED ORDER — SODIUM CHLORIDE 0.9 % IV BOLUS (SEPSIS)
1000.0000 mL | Freq: Once | INTRAVENOUS | Status: AC
  Administered 2014-07-25: 1000 mL via INTRAVENOUS

## 2014-07-25 MED ORDER — IBUPROFEN 800 MG PO TABS
800.0000 mg | ORAL_TABLET | Freq: Once | ORAL | Status: AC
  Administered 2014-07-25: 800 mg via ORAL
  Filled 2014-07-25: qty 1

## 2014-07-25 MED ORDER — HYDROMORPHONE HCL 1 MG/ML IJ SOLN
1.0000 mg | Freq: Once | INTRAMUSCULAR | Status: AC
  Administered 2014-07-25: 1 mg via INTRAVENOUS
  Filled 2014-07-25: qty 1

## 2014-07-25 MED ORDER — ACETAMINOPHEN 325 MG PO TABS
650.0000 mg | ORAL_TABLET | Freq: Once | ORAL | Status: DC
  Filled 2014-07-25: qty 2

## 2014-07-25 NOTE — ED Notes (Addendum)
Per EMS: pt c/o chronic abd and rectal pain, 150 mcg of fentanyl given in route to 18 g in left AC. 250 ml of NS

## 2014-07-25 NOTE — ED Notes (Signed)
Bed: WA18 Expected date:  Expected time:  Means of arrival:  Comments: 

## 2014-07-25 NOTE — Discharge Instructions (Signed)
If you were given medicines take as directed.  If you are on coumadin or contraceptives realize their levels and effectiveness is altered by many different medicines.  If you have any reaction (rash, tongues swelling, other) to the medicines stop taking and see a physician.    If your blood pressure was elevated in the ER make sure you follow up for management with a primary doctor or return for chest pain, shortness of breath or stroke symptoms.  Please follow up as directed and return to the ER or see a physician for new or worsening symptoms.  Thank you. Filed Vitals:   07/25/14 0839 07/25/14 1143  BP: 124/85 113/70  Pulse: 98 89  Temp: 98.2 F (36.8 C) 97.6 F (36.4 C)  TempSrc: Oral   Resp: 19 17  SpO2: 97% 100%

## 2014-07-25 NOTE — ED Provider Notes (Signed)
CSN: [id number]     Arrival date & time 07/25/14  9935 History   First MD Initiated Contact with Patient 07/25/14 [phone]     Chief Complaint  Patient presents with  . Abdominal Pain     (Consider location/radiation/quality/duration/timing/severity/associated sxs/prior Treatment) HPI Comments: 39 year-old female with history of smoking, hysterectomy, gallbladder removal, diabetes, recurrent abdominal pain presents with left upper abdominal pain worse the past 2 days. Patient has had this for months with intermittent worsening for no specific reason. Patient has mild blood in the stools which she's had before. Patient is a colitis before no diverticulitis. No fevers or chills. Patient has had multiple diarrhea episodes and vomiting non-bloody vomiting.  This is similar to previous. Patient has had a polyp removed in colonoscopy recently per her in the last year.  Patient is a 39 y.o. female presenting with abdominal pain. The history is provided by the patient.  Abdominal Pain Associated symptoms: diarrhea, nausea and vomiting   Associated symptoms: no chest pain, no chills, no dysuria, no fever and no shortness of breath     Past Medical History  Diagnosis Date  . Chronic headaches   . Anemia   . Gait instability   . IBS (irritable bowel syndrome)   . History of ectopic pregnancy     2009-  S/P LEFT SALPINGECTOMY  . History of cardiac arrest     during SVD 1992  . Type 2 diabetes mellitus   . History of adenomatous polyp of colon   . Anal pain     chronic  . Mild obstructive sleep apnea     study 03-20-2014  no cpap recommended  . Lumbar stenosis L4 -- L5 with bulging disk    w/ right leg weakness/ decreased mobility  . Chronic low back pain   . Cyst of right ovary   . Asthma     exacerbation 02-28-2014 and 02-23-2014 secondary to Rhinovirus  . Anxiety   . History of panic attacks   . Weakness of right leg     FROM BACK PROBLEM PER PT  . Difficult intravenous access     PER  PT NEEDS PICC LINE   Past Surgical History  Procedure Laterality Date  . Unilateral salpingectomy  2009    laparotomy left salpingectomy-- ectopic preg.  . Vaginal hysterectomy N/A 01/06/2013    Procedure: HYSTERECTOMY VAGINAL;  Surgeon: Osborne Oman, MD;  Location: [facility];  Service: Gynecology;  Laterality: N/A;  . Colonoscopy Left 04/29/2013    Procedure: COLONOSCOPY;  Surgeon: Arta Silence, MD;  Location: [facility];  Service: Endoscopy;  Laterality: Left;  . Flexible sigmoidoscopy N/A 11/09/2013    Procedure: FLEXIBLE SIGMOIDOSCOPY;  Surgeon: Arta Silence, MD;  Location: [facility];  Service: Endoscopy;  Laterality: N/A;  . Transthoracic echocardiogram  12-30-2012    mild LVH/  ef 55-60%  . Laparoscopic cholecystectomy  2005  . Evaluation under anesthesia with fistulectomy N/A 04/20/2014    Procedure: EXAM UNDER ANESTHESIA ;  Surgeon: Leighton Ruff, MD;  Location: [facility];  Service: General;  Laterality: N/A;  . Sphincterotomy N/A 04/20/2014    Procedure:  LATERAL INTERNAL SPHINCTEROTOMY;  Surgeon: Leighton Ruff, MD;  Location: [facility];  Service: General;  Laterality: N/A;  . Abdominal hysterectomy     Family History  Problem Relation Age of Onset  . Hypertension Mother   . Diabetes Mother   . Allergies Mother   . Heart disease Mother   . Cancer Father   .  Hyperlipidemia Father   . Hypertension Father   . Heart disease Maternal Grandmother   . Schizophrenia Sister   . Bipolar disorder Sister   . Bipolar disorder Brother   . Bipolar disorder Sister    History  Substance Use Topics  . Smoking status: Former Smoker -- 0.25 packs/day for 11 years    Types: Cigarettes    Quit date: 11/17/2013  . Smokeless tobacco: Never Used  . Alcohol Use: 0.0 oz/week    0 Standard drinks or equivalent per week   OB History    Gravida Para Term Preterm AB TAB SAB Ectopic Multiple Living   3 1 1  2 1  1  1      Review of Systems   Constitutional: Positive for appetite change. Negative for fever and chills.  HENT: Negative for congestion.   Eyes: Negative for visual disturbance.  Respiratory: Negative for shortness of breath.   Cardiovascular: Negative for chest pain.  Gastrointestinal: Positive for nausea, vomiting, abdominal pain, diarrhea and blood in stool.  Genitourinary: Negative for dysuria and flank pain.  Musculoskeletal: Negative for back pain, neck pain and neck stiffness.  Skin: Negative for rash.  Neurological: Negative for light-headedness and headaches.      Allergies  Penicillins; Shellfish allergy; and Other  Home Medications   Prior to Admission medications   Medication Sig Start Date End Date Taking? Authorizing Provider  albuterol (PROVENTIL HFA;VENTOLIN HFA) 108 (90 BASE) MCG/ACT inhaler Inhale 1-2 puffs into the lungs every 6 (six) hours as needed for wheezing or shortness of breath. 02/23/14  Yes Robbie Lis, MD  budesonide-formoterol [facility]) 160-4.5 MCG/ACT inhaler Inhale 2 puffs into the lungs 2 (two) times daily. 04/17/14  Yes Kathee Delton, MD  citalopram (CELEXA) 20 MG tablet Take 1 tablet (20 mg total) by mouth daily. 05/04/14 05/04/15 Yes Charlcie Cradle, MD  ipratropium-albuterol (DUONEB) 0.5-2.5 (3) MG/3ML SOLN Take 3 mLs by nebulization every 6 (six) hours as needed. 02/23/14  Yes Robbie Lis, MD  lithium carbonate (LITHOBID) 300 MG CR tablet Take 1 tablet (300 mg total) by mouth at bedtime. 05/04/14  Yes Charlcie Cradle, MD  metFORMIN (GLUCOPHAGE) 500 MG tablet Take 1 tablet (500 mg total) by mouth 2 (two) times daily with a meal. 10/10/13  Yes Lance Bosch, NP  SUMAtriptan (IMITREX) 50 MG tablet Take 1 tablet (50 mg total) by mouth once. May repeat in 2 hours if headache persists or recurs. 05/09/14  Yes Olugbemiga E Doreene Burke, MD   BP 113/70 mmHg  Pulse 89  Temp(Src) 97.6 F (36.4 C) (Oral)  Resp 17  SpO2 100%  LMP 11/27/2012 Physical Exam  Constitutional: She is oriented  to person, place, and time. She appears well-developed and well-nourished.  HENT:  Head: Normocephalic and atraumatic.  Mild dry mucous membranes  Eyes: Conjunctivae are normal. Right eye exhibits no discharge. Left eye exhibits no discharge.  Neck: Normal range of motion. Neck supple. No tracheal deviation present.  Cardiovascular: Normal rate and regular rhythm.   Pulmonary/Chest: Effort normal and breath sounds normal.  Abdominal: Soft. She exhibits no distension. There is tenderness (mild left mid abdomen no peritonitis, obese). There is no guarding.  Musculoskeletal: She exhibits no edema.  Neurological: She is alert and oriented to person, place, and time.  Skin: Skin is warm. No rash noted.  Psychiatric: She has a normal mood and affect.  Nursing note and vitals reviewed.   ED Course  Procedures (including critical care time) Labs Review Labs  Reviewed  LIPASE, BLOOD - Abnormal; Notable for the following:    Lipase 18 (*)    All other components within normal limits  COMPREHENSIVE METABOLIC PANEL - Abnormal; Notable for the following:    Calcium 8.4 (*)    Albumin 3.4 (*)    AST 114 (*)    ALT 66 (*)    All other components within normal limits  CBC WITH DIFFERENTIAL/PLATELET  URINALYSIS, ROUTINE W REFLEX MICROSCOPIC (NOT AT [facility])    Imaging Review No results found.   EKG Interpretation None      MDM   Final diagnoses:  Left upper quadrant pain  Diarrhea  Occult blood in stools   Patient presents with worsening left-sided abdominal pain for 2 days similar to multiple previous episodes. Plan for blood work, IV fluids pain meds nausea meds and reassessment. Patient has had multiple CT scans in the past, reviewed results of the past 5 or 6 with no significant findings. Discussed with patient goal to avoid CT scan if this is similar to previous and close follow-up with her primary doctor and gastroenterology for blood in the stools.  Patient improved in the ER,  tolerated oral fluids, vitals normal, blood work unremarkable. No peritonitis, no indication for emergent CT scan this time, recommended outpatient follow-up with her doctor and GI.  Results and differential diagnosis were discussed with the patient/parent/guardian. Close follow up outpatient was discussed, comfortable with the plan.   Medications  acetaminophen (TYLENOL) tablet 650 mg (not administered)  HYDROmorphone (DILAUDID) injection 1 mg (1 mg Intravenous Given 07/25/14 1017)  ondansetron (ZOFRAN) injection 4 mg (4 mg Intravenous Given 07/25/14 1017)  sodium chloride 0.9 % bolus 1,000 mL (1,000 mLs Intravenous New Bag/Given 07/25/14 1017)    Filed Vitals:   07/25/14 0839 07/25/14 1143  BP: 124/85 113/70  Pulse: 98 89  Temp: 98.2 F (36.8 C) 97.6 F (36.4 C)  TempSrc: Oral   Resp: 19 17  SpO2: 97% 100%    Final diagnoses:  Left upper quadrant pain  Diarrhea  Occult blood in stools       Elnora Morrison, MD 07/25/14 1241

## 2014-08-10 ENCOUNTER — Ambulatory Visit (HOSPITAL_COMMUNITY): Payer: Self-pay | Admitting: Psychiatry

## 2014-08-11 ENCOUNTER — Encounter: Payer: 59 | Admitting: Diagnostic Neuroimaging

## 2014-08-14 ENCOUNTER — Encounter: Payer: Self-pay | Admitting: Diagnostic Neuroimaging

## 2014-08-17 ENCOUNTER — Encounter: Payer: Self-pay | Admitting: *Deleted

## 2014-08-17 ENCOUNTER — Telehealth: Payer: Self-pay | Admitting: *Deleted

## 2014-08-17 ENCOUNTER — Other Ambulatory Visit: Payer: Self-pay | Admitting: *Deleted

## 2014-08-17 DIAGNOSIS — R269 Unspecified abnormalities of gait and mobility: Secondary | ICD-10-CM

## 2014-08-17 DIAGNOSIS — R29898 Other symptoms and signs involving the musculoskeletal system: Secondary | ICD-10-CM

## 2014-08-17 NOTE — Telephone Encounter (Signed)
Unable to leave a message on the pts phone, will mail her a letter with her MRI results and the explanation of those results. I will also explain to her that Dr. Leta Baptist wants to check and MRI of the brain and a NCV/EMG. If she calls and has any questions, please direct them to Verneita Griffes, RN

## 2014-08-28 ENCOUNTER — Ambulatory Visit (HOSPITAL_COMMUNITY): Payer: Self-pay | Admitting: Psychiatry

## 2014-08-29 ENCOUNTER — Ambulatory Visit (INDEPENDENT_AMBULATORY_CARE_PROVIDER_SITE_OTHER): Payer: 59 | Admitting: Psychiatry

## 2014-08-29 ENCOUNTER — Encounter (HOSPITAL_COMMUNITY): Payer: Self-pay | Admitting: Psychiatry

## 2014-08-29 VITALS — BP 124/74 | HR 82 | Ht 65.0 in | Wt 248.6 lb

## 2014-08-29 DIAGNOSIS — F419 Anxiety disorder, unspecified: Secondary | ICD-10-CM

## 2014-08-29 DIAGNOSIS — F329 Major depressive disorder, single episode, unspecified: Secondary | ICD-10-CM | POA: Diagnosis not present

## 2014-08-29 DIAGNOSIS — F4010 Social phobia, unspecified: Secondary | ICD-10-CM

## 2014-08-29 DIAGNOSIS — R440 Auditory hallucinations: Secondary | ICD-10-CM | POA: Diagnosis not present

## 2014-08-29 DIAGNOSIS — F411 Generalized anxiety disorder: Secondary | ICD-10-CM

## 2014-08-29 DIAGNOSIS — F4001 Agoraphobia with panic disorder: Secondary | ICD-10-CM

## 2014-08-29 DIAGNOSIS — F333 Major depressive disorder, recurrent, severe with psychotic symptoms: Secondary | ICD-10-CM

## 2014-08-29 DIAGNOSIS — F4310 Post-traumatic stress disorder, unspecified: Secondary | ICD-10-CM

## 2014-08-29 MED ORDER — ARIPIPRAZOLE 10 MG PO TABS: 10.0000 mg | ORAL_TABLET | Freq: Every day | ORAL | Status: AC

## 2014-08-29 MED ORDER — CITALOPRAM HYDROBROMIDE 20 MG PO TABS: 20.0000 mg | ORAL_TABLET | Freq: Every day | ORAL | Status: DC

## 2014-08-29 MED ORDER — LITHIUM CARBONATE ER 300 MG PO TBCR
300.0000 mg | EXTENDED_RELEASE_TABLET | Freq: Every day | ORAL | Status: DC
Start: 2014-08-29 — End: 2016-07-11

## 2014-08-29 NOTE — Progress Notes (Signed)
[facility] MD Progress Note  08/29/2014 3:22 PM Marisa [name]  MRN:  [id number] Subjective:  I feel depressed History of present illness: Patient seen for the first time by Dr. Salem Senate, patient sees Dr. Doyne Keel on irregular basis. States that she started the lithium and Celexa does not seem to have much help reports that her anxiety is worse and her voices have gotten worse. Continues to grieve the loss of her husband who passed away. Occasionally does hear husbands voice. Patient has multiple medical problems and is presently in a wheelchair. Reports that she is not sleeping well, appetite tends to fluctuate mood is depressed has occasional panic attacks energy is poor, feels anhedonic with no motivation.. Denies suicidal or homicidal ideation has auditory hallucinations denies delusions. Discussed rationale risks benefits options of Abilify to help treat her voices Asian stated that she had taken Abilify 5 mg in the past but that did not help her so discussed giving her 10 mg at bedtime and patient gave informed consent.     Diagnosis: Patient Active Problem List   Diagnosis Date Noted  . Migraine variant with headache [G43.809] 05/09/2014  . Unable to ambulate [R26.2] 05/09/2014  . Severe recurrent major depressive disorder with psychotic features [F33.3] 05/04/2014  . GAD (generalized anxiety disorder) [F41.1] 05/04/2014  . Panic disorder with agoraphobia [F40.01] 05/04/2014  . Social anxiety disorder [F40.10] 05/04/2014  . PTSD (post-traumatic stress disorder) [F43.10] 05/04/2014  . Cigarette nicotine dependence without complication [E33.295] 18/84/1660  . Gait disturbance [R26.9] 04/11/2014  . Depression [F32.9] 03/27/2014  . Falls [phone]] 03/27/2014  . OSA (obstructive sleep apnea) [G47.33] 03/06/2014  . Insomnia [G47.00] 03/06/2014  . Anxiety [F41.9]   . History of cardiac arrest [Z86.74]   . Acute bronchitis [J20.9]   . Asthma exacerbation [J45.901] 02/20/2014  . Well  controlled type 2 diabetes mellitus [E11.9] 02/20/2014  . Sore throat [J02.9] 02/20/2014  . Chest pain [R07.9] 02/20/2014  . Tachycardia [R00.0] 02/20/2014  . Diabetes mellitus without complication [Z60.1] 09/32/3557  . Pelvic pain [R10.2] 07/25/2013  . Rectal bleeding [K62.5] 07/25/2013  . Persistent vomiting [R11.10] 04/27/2013  . Rectal bleed [K62.5] 04/26/2013  . Atypical chest pain [R07.89] 04/26/2013  . Abdominal pain [R10.9] 04/26/2013  . S/P Total vaginal hysterectomy on 01/06/13 [Z90.710] 01/06/2013  . Dyspnea [R06.00] 09/07/2012  . 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 Anemia [D64.9] 07/30/2012  . Current smoker [Z72.0] 07/30/2012   Total Time spent with patient: 30 minutes   Past Medical History:  Past Medical History  Diagnosis Date  . Chronic headaches   . Anemia   . Gait instability   . IBS (irritable bowel syndrome)   . History of ectopic pregnancy     2009-  S/P LEFT SALPINGECTOMY  . History of cardiac arrest     during SVD 1992  . Type 2 diabetes mellitus   . History of adenomatous polyp of colon   . Anal pain     chronic  . Mild obstructive sleep apnea     study 03-20-2014  no cpap recommended  . Lumbar stenosis L4 -- L5 with bulging disk    w/ right leg weakness/ decreased mobility  . Chronic low back pain   . Cyst of right ovary   . Asthma     exacerbation 02-28-2014 and 02-23-2014 secondary to Rhinovirus  . Anxiety   . History of panic attacks   . Weakness of right leg     FROM BACK PROBLEM PER PT  . Difficult  intravenous access     PER PT NEEDS PICC LINE    Past Surgical History  Procedure Laterality Date  . Unilateral salpingectomy  2009    laparotomy left salpingectomy-- ectopic preg.  . Vaginal hysterectomy N/A 01/06/2013    Procedure: HYSTERECTOMY VAGINAL;  Surgeon: Osborne Oman, MD;  Location: [facility];  Service: Gynecology;  Laterality: N/A;  . Colonoscopy Left 04/29/2013    Procedure: COLONOSCOPY;  Surgeon: Arta Silence, MD;   Location: [facility];  Service: Endoscopy;  Laterality: Left;  . Flexible sigmoidoscopy N/A 11/09/2013    Procedure: FLEXIBLE SIGMOIDOSCOPY;  Surgeon: Arta Silence, MD;  Location: [facility];  Service: Endoscopy;  Laterality: N/A;  . Transthoracic echocardiogram  12-30-2012    mild LVH/  ef 55-60%  . Laparoscopic cholecystectomy  2005  . Evaluation under anesthesia with fistulectomy N/A 04/20/2014    Procedure: EXAM UNDER ANESTHESIA ;  Surgeon: Leighton Ruff, MD;  Location: [facility];  Service: General;  Laterality: N/A;  . Sphincterotomy N/A 04/20/2014    Procedure:  LATERAL INTERNAL SPHINCTEROTOMY;  Surgeon: Leighton Ruff, MD;  Location: [facility];  Service: General;  Laterality: N/A;  . Abdominal hysterectomy     Family History:  Family History  Problem Relation Age of Onset  . Hypertension Mother   . Diabetes Mother   . Allergies Mother   . Heart disease Mother   . Cancer Father   . Hyperlipidemia Father   . Hypertension Father   . Heart disease Maternal Grandmother   . Schizophrenia Sister   . Bipolar disorder Sister   . Bipolar disorder Brother   . Bipolar disorder Sister    Social History:  History  Alcohol Use No     History  Drug Use No    History   Social History  . Marital Status: Widowed    Spouse Name: N/A  . Number of Children: 1  . Years of Education: N/A   Occupational History  . SITTER    Social History Main Topics  . Smoking status: Former Smoker -- 0.20 packs/day for 11 years    Types: Cigarettes    Quit date: 11/17/2013  . Smokeless tobacco: Never Used  . Alcohol Use: No  . Drug Use: No  . Sexual Activity: No   Other Topics Concern  . None   Social History Narrative   Lives with sister   Drinks no caffeine   Additional History:    Sleep: Poor  Appetite:  Fair     Musculoskeletal: Strength & Muscle Tone: within normal limits Gait & Station: ataxic Patient leans: N/A   Psychiatric  Specialty Exam: Physical Exam  Review of Systems  Constitutional: Negative for fever, chills and weight loss.  HENT: Negative for congestion and hearing loss.   Eyes: Negative for photophobia and pain.  Respiratory: Negative for cough, sputum production, wheezing and stridor.   Cardiovascular: Negative for chest pain and palpitations.  Gastrointestinal: Negative for heartburn, nausea, vomiting, abdominal pain and diarrhea.  Genitourinary: Negative for dysuria, urgency and flank pain.  Musculoskeletal: Positive for myalgias. Negative for falls and neck pain.  Skin: Negative for rash.  Neurological: Positive for tingling, sensory change, focal weakness, weakness and headaches. Negative for dizziness and seizures.  Endo/Heme/Allergies: Negative for environmental allergies and polydipsia. Does not bruise/bleed easily.  Psychiatric/Behavioral: Positive for depression. The patient is nervous/anxious and has insomnia.     Blood pressure 124/74, pulse 82, height 5\' 5"  (1.651 m), weight 248 lb 9.6 oz (  112.764 kg), last menstrual period 11/27/2012.Body mass index is 41.37 kg/(m^2).  General Appearance: Casual  Eye Contact::  Fair  Speech:  Clear and Coherent and Normal Rate  Volume:  Normal  Mood:  Anxious, Depressed and Dysphoric  Affect:  Constricted and Depressed  Thought Process:  Goal Directed and Linear  Orientation:  Full (Time, Place, and Person)  Thought Content:  Rumination  Suicidal Thoughts:  No  Homicidal Thoughts:  No  Memory:  Immediate;   Good Recent;   Good Remote;   Good  Judgement:  Good  Insight:  Present  Psychomotor Activity:  Decreased and Patient didn't wheelchair due to her diabetic nerve damage  Concentration:  Fair  Recall:  Good  Fund of Knowledge:Good  Language: Good  Akathisia:  No  Handed:  Right  AIMS (if indicated):     Assets:  Communication Skills Desire for Improvement Physical Health Resilience Social Support  ADL's:  Intact  Cognition: WNL   Sleep:        Current Medications: Current Outpatient Prescriptions  Medication Sig Dispense Refill  . albuterol (PROVENTIL HFA;VENTOLIN HFA) 108 (90 BASE) MCG/ACT inhaler Inhale 1-2 puffs into the lungs every 6 (six) hours as needed for wheezing or shortness of breath. 1 Inhaler 1  . budesonide-formoterol (SYMBICORT) 160-4.5 MCG/ACT inhaler Inhale 2 puffs into the lungs 2 (two) times daily. 1 Inhaler 12  . citalopram (CELEXA) 20 MG tablet Take 1 tablet (20 mg total) by mouth daily. 30 tablet 1  . ipratropium-albuterol (DUONEB) 0.5-2.5 (3) MG/3ML SOLN Take 3 mLs by nebulization every 6 (six) hours as needed. 360 mL 0  . metFORMIN (GLUCOPHAGE) 500 MG tablet Take 1 tablet (500 mg total) by mouth 2 (two) times daily with a meal. 180 tablet 3  . lithium carbonate (LITHOBID) 300 MG CR tablet Take 1 tablet (300 mg total) by mouth at bedtime. (Patient not taking: Reported on 08/29/2014) 30 tablet 1  . SUMAtriptan (IMITREX) 50 MG tablet Take 1 tablet (50 mg total) by mouth once. May repeat in 2 hours if headache persists or recurs. (Patient not taking: Reported on 08/29/2014) 20 tablet 0   No current facility-administered medications for this visit.    Lab Results: No results found for this or any previous visit (from the past 48 hour(s)).  Physical Findings: AIMS:  , ,  ,  ,    CIWA:    COWS:     Treatment Plan Summary: Daily contact with patient to assess and evaluate symptoms and progress in treatment and Medication management Depression Will be treated with Celexa 20 mg every morning and lithium 300 mg twice a day. Anxiety will be treated with Celexa and cognitive behavior therapy. Auditory hallucinations will be treated with Abilify 10 mg by mouth daily at bedtime patient gave informed consent for this. Continue therapy. Return to clinic in one month or earlier if necessary patient can call if she needs any help. Medical Decision Making:  Review of Psycho-Social Stressors (1), Review  and summation of old records (2), New Problem, with no additional work-up planned (3), Review of Last Therapy Session (1), Review of Medication Regimen & Side Effects (2) and Review of New Medication or Change in Dosage (2)     Keyosha Tiedt 08/29/2014, 3:22 PM

## 2014-08-31 ENCOUNTER — Telehealth: Payer: Self-pay | Admitting: Internal Medicine

## 2014-08-31 NOTE — Telephone Encounter (Signed)
Patient called requesting to speak to PCP regarding medical clearance needed. Please f/u with pt

## 2014-09-13 ENCOUNTER — Ambulatory Visit (INDEPENDENT_AMBULATORY_CARE_PROVIDER_SITE_OTHER): Payer: 59

## 2014-09-13 DIAGNOSIS — R269 Unspecified abnormalities of gait and mobility: Secondary | ICD-10-CM | POA: Diagnosis not present

## 2014-09-13 DIAGNOSIS — R29898 Other symptoms and signs involving the musculoskeletal system: Secondary | ICD-10-CM | POA: Diagnosis not present

## 2014-09-13 NOTE — Progress Notes (Signed)
Agree 

## 2014-09-13 NOTE — Progress Notes (Signed)
Patient requested medication for anxiety before her MRI scheduled today, she was given Xanax pack.

## 2014-09-14 ENCOUNTER — Encounter (INDEPENDENT_AMBULATORY_CARE_PROVIDER_SITE_OTHER): Payer: Self-pay | Admitting: Diagnostic Neuroimaging

## 2014-09-14 ENCOUNTER — Ambulatory Visit (INDEPENDENT_AMBULATORY_CARE_PROVIDER_SITE_OTHER): Payer: 59 | Admitting: Diagnostic Neuroimaging

## 2014-09-14 DIAGNOSIS — R269 Unspecified abnormalities of gait and mobility: Secondary | ICD-10-CM

## 2014-09-14 DIAGNOSIS — M4802 Spinal stenosis, cervical region: Secondary | ICD-10-CM

## 2014-09-14 DIAGNOSIS — Z0289 Encounter for other administrative examinations: Secondary | ICD-10-CM

## 2014-09-14 DIAGNOSIS — R29898 Other symptoms and signs involving the musculoskeletal system: Secondary | ICD-10-CM | POA: Diagnosis not present

## 2014-09-14 NOTE — Procedures (Signed)
   [facility]  NCS (NERVE CONDUCTION STUDY) WITH EMG (ELECTROMYOGRAPHY) REPORT   STUDY DATE: 09/14/14 PATIENT NAME: Marisa [name] DOB: [birth year] MRN: [id number]  ORDERING CLINICIAN: Andrey Spearman, MD   TECHNOLOGIST: Laretta Alstrom  ELECTROMYOGRAPHER: Earlean Polka. Elieser Tetrick, MD  CLINICAL INFORMATION: 39 year old female with muscle weakness and pain.  FINDINGS: NERVE CONDUCTION STUDY: Bilateral peroneal and tibial motor responses and F wave latencies are normal. Bilateral peroneal sensory responses are normal.  NEEDLE ELECTROMYOGRAPHY: Needle examination of right vastus medialis, tibials anterior, gastrocnemius is normal.   IMPRESSION:  This is a normal study. No electrodiagnostic evidence of large fiber neuropathy at this time.    INTERPRETING PHYSICIAN:  Penni Bombard, MD Certified in Neurology, Neurophysiology and Neuroimaging  [facility] [address], [place], [place] [address] [phone]

## 2014-10-17 DIAGNOSIS — Z0271 Encounter for disability determination: Secondary | ICD-10-CM

## 2014-10-26 ENCOUNTER — Ambulatory Visit: Payer: Self-pay | Admitting: Internal Medicine

## 2014-11-02 ENCOUNTER — Ambulatory Visit: Payer: Self-pay | Admitting: Internal Medicine

## 2014-11-06 DIAGNOSIS — Z0271 Encounter for disability determination: Secondary | ICD-10-CM

## 2014-12-05 ENCOUNTER — Ambulatory Visit (HOSPITAL_COMMUNITY): Payer: Self-pay | Admitting: Psychiatry

## 2015-02-07 ENCOUNTER — Encounter (HOSPITAL_COMMUNITY): Payer: Self-pay | Admitting: *Deleted

## 2015-02-07 ENCOUNTER — Emergency Department (HOSPITAL_COMMUNITY): Payer: 59

## 2015-02-07 ENCOUNTER — Emergency Department (HOSPITAL_COMMUNITY)
Admission: EM | Admit: 2015-02-07 | Discharge: 2015-02-07 | Disposition: A | Discharge status: Home / Self Care | Payer: 59 | Attending: Emergency Medicine | Admitting: Emergency Medicine

## 2015-02-07 DIAGNOSIS — Z86018 Personal history of other benign neoplasm: Secondary | ICD-10-CM | POA: Insufficient documentation

## 2015-02-07 DIAGNOSIS — G8929 Other chronic pain: Secondary | ICD-10-CM | POA: Insufficient documentation

## 2015-02-07 DIAGNOSIS — Z87891 Personal history of nicotine dependence: Secondary | ICD-10-CM | POA: Insufficient documentation

## 2015-02-07 DIAGNOSIS — Z880 Allergy status to penicillin: Secondary | ICD-10-CM | POA: Insufficient documentation

## 2015-02-07 DIAGNOSIS — J45909 Unspecified asthma, uncomplicated: Secondary | ICD-10-CM | POA: Insufficient documentation

## 2015-02-07 DIAGNOSIS — S0990XA Unspecified injury of head, initial encounter: Secondary | ICD-10-CM | POA: Insufficient documentation

## 2015-02-07 DIAGNOSIS — R110 Nausea: Secondary | ICD-10-CM | POA: Insufficient documentation

## 2015-02-07 DIAGNOSIS — R51 Headache: Secondary | ICD-10-CM

## 2015-02-07 DIAGNOSIS — W19XXXA Unspecified fall, initial encounter: Secondary | ICD-10-CM

## 2015-02-07 DIAGNOSIS — R531 Weakness: Secondary | ICD-10-CM | POA: Insufficient documentation

## 2015-02-07 DIAGNOSIS — Y9389 Activity, other specified: Secondary | ICD-10-CM | POA: Insufficient documentation

## 2015-02-07 DIAGNOSIS — F419 Anxiety disorder, unspecified: Secondary | ICD-10-CM | POA: Insufficient documentation

## 2015-02-07 DIAGNOSIS — Z8739 Personal history of other diseases of the musculoskeletal system and connective tissue: Secondary | ICD-10-CM | POA: Insufficient documentation

## 2015-02-07 DIAGNOSIS — R200 Anesthesia of skin: Secondary | ICD-10-CM | POA: Insufficient documentation

## 2015-02-07 DIAGNOSIS — Y9289 Other specified places as the place of occurrence of the external cause: Secondary | ICD-10-CM | POA: Insufficient documentation

## 2015-02-07 DIAGNOSIS — Z862 Personal history of diseases of the blood and blood-forming organs and certain disorders involving the immune mechanism: Secondary | ICD-10-CM | POA: Insufficient documentation

## 2015-02-07 DIAGNOSIS — W1839XA Other fall on same level, initial encounter: Secondary | ICD-10-CM | POA: Insufficient documentation

## 2015-02-07 DIAGNOSIS — Z79899 Other long term (current) drug therapy: Secondary | ICD-10-CM | POA: Insufficient documentation

## 2015-02-07 DIAGNOSIS — Z7951 Long term (current) use of inhaled steroids: Secondary | ICD-10-CM | POA: Insufficient documentation

## 2015-02-07 DIAGNOSIS — R519 Headache, unspecified: Secondary | ICD-10-CM

## 2015-02-07 DIAGNOSIS — Z8674 Personal history of sudden cardiac arrest: Secondary | ICD-10-CM | POA: Insufficient documentation

## 2015-02-07 DIAGNOSIS — H53149 Visual discomfort, unspecified: Secondary | ICD-10-CM | POA: Insufficient documentation

## 2015-02-07 DIAGNOSIS — Y998 Other external cause status: Secondary | ICD-10-CM | POA: Insufficient documentation

## 2015-02-07 DIAGNOSIS — R197 Diarrhea, unspecified: Secondary | ICD-10-CM | POA: Insufficient documentation

## 2015-02-07 DIAGNOSIS — Z8719 Personal history of other diseases of the digestive system: Secondary | ICD-10-CM | POA: Insufficient documentation

## 2015-02-07 DIAGNOSIS — R55 Syncope and collapse: Secondary | ICD-10-CM | POA: Insufficient documentation

## 2015-02-07 LAB — CBC WITH DIFFERENTIAL/PLATELET
Basophils Absolute: 0.1 10*3/uL (ref 0.0–0.1)
Basophils Relative: 1 %
Eosinophils Absolute: 0.1 10*3/uL (ref 0.0–0.7)
Eosinophils Relative: 1 %
HCT: 43.2 % (ref 36.0–46.0)
Hemoglobin: 14.8 g/dL (ref 12.0–15.0)
Lymphocytes Relative: 38 %
Lymphs Abs: 4 10*3/uL (ref 0.7–4.0)
MCH: 32.8 pg (ref 26.0–34.0)
MCHC: 34.3 g/dL (ref 30.0–36.0)
MCV: 95.8 fL (ref 78.0–100.0)
Monocytes Absolute: 0.6 10*3/uL (ref 0.1–1.0)
Monocytes Relative: 6 %
Neutro Abs: 5.8 10*3/uL (ref 1.7–7.7)
Neutrophils Relative %: 54 %
Platelets: 450 10*3/uL — ABNORMAL HIGH (ref 150–400)
RBC: 4.51 MIL/uL (ref 3.87–5.11)
RDW: 12.5 % (ref 11.5–15.5)
WBC: 10.6 10*3/uL — ABNORMAL HIGH (ref 4.0–10.5)

## 2015-02-07 LAB — URINALYSIS, ROUTINE W REFLEX MICROSCOPIC
Glucose, UA: NEGATIVE mg/dL
Hgb urine dipstick: NEGATIVE
Ketones, ur: NEGATIVE mg/dL
Leukocytes, UA: NEGATIVE
Nitrite: NEGATIVE
Protein, ur: NEGATIVE mg/dL
Specific Gravity, Urine: 1.03 (ref 1.005–1.030)
pH: 5.5 (ref 5.0–8.0)

## 2015-02-07 LAB — COMPREHENSIVE METABOLIC PANEL
ALT: 45 U/L (ref 14–54)
AST: 31 U/L (ref 15–41)
Albumin: 3.6 g/dL (ref 3.5–5.0)
Alkaline Phosphatase: 65 U/L (ref 38–126)
Anion gap: 11 (ref 5–15)
BUN: 8 mg/dL (ref 6–20)
CO2: 23 mmol/L (ref 22–32)
Calcium: 9.3 mg/dL (ref 8.9–10.3)
Chloride: 105 mmol/L (ref 101–111)
Creatinine, Ser: 0.7 mg/dL (ref 0.44–1.00)
GFR calc Af Amer: >60 mL/min (ref >60)
GFR calc non Af Amer: >60 mL/min (ref >60)
Glucose, Bld: 101 mg/dL — ABNORMAL HIGH (ref 65–99)
Potassium: 4.2 mmol/L (ref 3.5–5.1)
Sodium: 139 mmol/L (ref 135–145)
Total Bilirubin: 0.7 mg/dL (ref 0.3–1.2)
Total Protein: 7.5 g/dL (ref 6.5–8.1)

## 2015-02-07 LAB — TROPONIN I: Troponin I: <0.03 ng/mL (ref <0.031)

## 2015-02-07 LAB — CBG MONITORING, ED: Glucose-Capillary: 88 mg/dL (ref 65–99)

## 2015-02-07 LAB — LIPASE, BLOOD: Lipase: 29 U/L (ref 11–51)

## 2015-02-07 MED ORDER — KETOROLAC TROMETHAMINE 30 MG/ML IJ SOLN
30.0000 mg | Freq: Once | INTRAMUSCULAR | Status: AC
  Administered 2015-02-07: 30 mg via INTRAVENOUS
  Filled 2015-02-07: qty 1

## 2015-02-07 MED ORDER — IBUPROFEN 400 MG PO TABS: 400.0000 mg | ORAL_TABLET | Freq: Three times a day (TID) | ORAL | Status: DC

## 2015-02-07 MED ORDER — ONDANSETRON HCL 4 MG PO TABS: 4.0000 mg | ORAL_TABLET | Freq: Four times a day (QID) | ORAL | Status: DC

## 2015-02-07 MED ORDER — DIPHENHYDRAMINE HCL 50 MG/ML IJ SOLN
25.0000 mg | Freq: Once | INTRAMUSCULAR | Status: AC
  Administered 2015-02-07: 25 mg via INTRAVENOUS
  Filled 2015-02-07: qty 1

## 2015-02-07 MED ORDER — METOCLOPRAMIDE HCL 5 MG/ML IJ SOLN
5.0000 mg | Freq: Once | INTRAMUSCULAR | Status: AC
  Administered 2015-02-07: 5 mg via INTRAVENOUS
  Filled 2015-02-07: qty 2

## 2015-02-07 MED ORDER — DEXAMETHASONE SODIUM PHOSPHATE 10 MG/ML IJ SOLN
10.0000 mg | Freq: Once | INTRAMUSCULAR | Status: AC
  Administered 2015-02-07: 10 mg via INTRAVENOUS
  Filled 2015-02-07: qty 1

## 2015-02-07 MED ORDER — SODIUM CHLORIDE 0.9 % IV BOLUS (SEPSIS)
1000.0000 mL | Freq: Once | INTRAVENOUS | Status: AC
  Administered 2015-02-07: 1000 mL via INTRAVENOUS

## 2015-02-07 MED ORDER — ONDANSETRON HCL 4 MG/2ML IJ SOLN
4.0000 mg | Freq: Once | INTRAMUSCULAR | Status: AC
  Administered 2015-02-07: 4 mg via INTRAVENOUS
  Filled 2015-02-07: qty 2

## 2015-02-07 NOTE — ED Notes (Signed)
Pt complains of migraine, nausea, diarrhea for the past 2 days. Pt took Excedrin, ibuprofen with no relief. Pt states she stood up and fell at 1pm today, hitting her head. Pt did not lose consciousness. Pt has hx of leg weakness/gait instability.

## 2015-02-07 NOTE — ED Notes (Addendum)
Pt up, ambulated around department. Wobbly at times, able to maintain balance and walk on her own. Walks best while holding onto guide rails by wall. States her headache is much better, able to tolerate the light being on. Now eating a sandwich and drinking a sprite. PA Lonn Georgia is notified.

## 2015-02-07 NOTE — ED Notes (Signed)
Patient ambulated a few steps to wheelchair, to and from toilet and to bed. Patient was very wobbly during these steps and needed to be held onto. Lonn Georgia PA notified

## 2015-02-07 NOTE — ED Notes (Signed)
Patient transported to X-ray and CT 

## 2015-02-07 NOTE — ED Provider Notes (Signed)
CSN: [id number]     Arrival date & time 02/07/15  1359 History   First MD Initiated Contact with Patient 02/07/15 1616     Chief Complaint  Patient presents with  . Migraine     (Consider location/radiation/quality/duration/timing/severity/associated sxs/prior Treatment) Patient is a 39 y.o. female presenting with general illness.  Illness Severity:  Moderate Onset quality:  Gradual Duration:  1 week Timing:  Constant Progression:  Worsening Chronicity:  New Associated symptoms: chest pain, diarrhea, headaches and nausea   Associated symptoms: no abdominal pain, no cough, no fever, no loss of consciousness, no shortness of breath and no vomiting    Marisa [name] is a 39 y.o. female with PMH significant for chronic headaches, gait instability, IBS, DM, lumbar stenosis, right leg weakness, anxiety, and panic attacks who presents with 1 week history of constant, moderate, worsening migraine with phonophobia and photophobia.  She describes it as stabbing behind her right eye.  Similar to previous migraines.  She also reports she stood up today and "blacked out" causing her to hit the left side of her head.  Denies LOC.  She also reports left upper extremities numbness and weakness as well as left facial droop and slurred speech that have resolved 2 days ago.  She reports taking Excedrin and ibuprofen with no relief. She reports she does have neurologist, and similar presentations to this have happened in the past; however, she did not have facial droop, slurred speech, or LUE weakness.  Past Medical History  Diagnosis Date  . Chronic headaches   . Anemia   . Gait instability   . IBS (irritable bowel syndrome)   . History of ectopic pregnancy     2009-  S/P LEFT SALPINGECTOMY  . History of cardiac arrest     during SVD 1992  . Type 2 diabetes mellitus ([place])   . History of adenomatous polyp of colon   . Anal pain     chronic  . Mild obstructive sleep apnea     study 03-20-2014   no cpap recommended  . Lumbar stenosis L4 -- L5 with bulging disk    w/ right leg weakness/ decreased mobility  . Chronic low back pain   . Cyst of right ovary   . Asthma     exacerbation 02-28-2014 and 02-23-2014 secondary to Rhinovirus  . Anxiety   . History of panic attacks   . Weakness of right leg     FROM BACK PROBLEM PER PT  . Difficult intravenous access     PER PT NEEDS PICC LINE   Past Surgical History  Procedure Laterality Date  . Unilateral salpingectomy  2009    laparotomy left salpingectomy-- ectopic preg.  . Vaginal hysterectomy N/A 01/06/2013    Procedure: HYSTERECTOMY VAGINAL;  Surgeon: Osborne Oman, MD;  Location: [facility];  Service: Gynecology;  Laterality: N/A;  . Colonoscopy Left 04/29/2013    Procedure: COLONOSCOPY;  Surgeon: Arta Silence, MD;  Location: [facility];  Service: Endoscopy;  Laterality: Left;  . Flexible sigmoidoscopy N/A 11/09/2013    Procedure: FLEXIBLE SIGMOIDOSCOPY;  Surgeon: Arta Silence, MD;  Location: [facility];  Service: Endoscopy;  Laterality: N/A;  . Transthoracic echocardiogram  12-30-2012    mild LVH/  ef 55-60%  . Laparoscopic cholecystectomy  2005  . Evaluation under anesthesia with fistulectomy N/A 04/20/2014    Procedure: EXAM UNDER ANESTHESIA ;  Surgeon: Leighton Ruff, MD;  Location: [facility];  Service: General;  Laterality: N/A;  . Sphincterotomy  N/A 04/20/2014    Procedure:  LATERAL INTERNAL SPHINCTEROTOMY;  Surgeon: Leighton Ruff, MD;  Location: [facility];  Service: General;  Laterality: N/A;  . Abdominal hysterectomy     Family History  Problem Relation Age of Onset  . Hypertension Mother   . Diabetes Mother   . Allergies Mother   . Heart disease Mother   . Cancer Father   . Hyperlipidemia Father   . Hypertension Father   . Heart disease Maternal Grandmother   . Schizophrenia Sister   . Bipolar disorder Sister   . Bipolar disorder Brother   . Bipolar disorder Sister     Social History  Substance Use Topics  . Smoking status: Former Smoker -- 0.20 packs/day for 11 years    Types: Cigarettes    Quit date: 11/17/2013  . Smokeless tobacco: Never Used  . Alcohol Use: No   OB History    Gravida Para Term Preterm AB TAB SAB Ectopic Multiple Living   3 1 1  2 1  1  1      Review of Systems  Constitutional: Negative for fever.  Eyes: Positive for photophobia.  Respiratory: Negative for cough and shortness of breath.   Cardiovascular: Positive for chest pain.  Gastrointestinal: Positive for nausea and diarrhea. Negative for vomiting and abdominal pain.  Genitourinary: Negative.   Musculoskeletal: Negative for neck pain and neck stiffness.  Neurological: Positive for syncope, facial asymmetry, weakness, numbness and headaches. Negative for loss of consciousness.  All other systems reviewed and are negative.     Allergies  Penicillins; Shellfish allergy; and Other  Home Medications   Prior to Admission medications   Medication Sig Start Date End Date Taking? Authorizing Provider  acetaminophen (TYLENOL) 500 MG tablet Take 1,000 mg by mouth every 6 (six) hours as needed for headache.   Yes Historical Provider, MD  albuterol (PROVENTIL HFA;VENTOLIN HFA) 108 (90 BASE) MCG/ACT inhaler Inhale 1-2 puffs into the lungs every 6 (six) hours as needed for wheezing or shortness of breath. 02/23/14  Yes Robbie Lis, MD  ARIPiprazole (ABILIFY) 10 MG tablet Take 1 tablet (10 mg total) by mouth daily. 08/29/14 08/29/15 Yes Leonides Grills, MD  aspirin-acetaminophen-caffeine (EXCEDRIN MIGRAINE) [phone] MG tablet Take 2 tablets by mouth every 6 (six) hours as needed for headache.   Yes Historical Provider, MD  budesonide-formoterol (SYMBICORT) 160-4.5 MCG/ACT inhaler Inhale 2 puffs into the lungs 2 (two) times daily. 04/17/14  Yes Kathee Delton, MD  citalopram (CELEXA) 20 MG tablet Take 1 tablet (20 mg total) by mouth daily. 08/29/14 08/29/15 Yes Leonides Grills, MD  ibuprofen (ADVIL,MOTRIN) 200 MG tablet Take 400 mg by mouth every 6 (six) hours as needed for headache.   Yes Historical Provider, MD  ipratropium-albuterol (DUONEB) 0.5-2.5 (3) MG/3ML SOLN Take 3 mLs by nebulization every 6 (six) hours as needed. 02/23/14  Yes Robbie Lis, MD  metFORMIN (GLUCOPHAGE) 500 MG tablet Take 1 tablet (500 mg total) by mouth 2 (two) times daily with a meal. 10/10/13  Yes Lance Bosch, NP  lithium carbonate (LITHOBID) 300 MG CR tablet Take 1 tablet (300 mg total) by mouth at bedtime. Patient not taking: Reported on 02/07/2015 08/29/14   Leonides Grills, MD  SUMAtriptan (IMITREX) 50 MG tablet Take 1 tablet (50 mg total) by mouth once. May repeat in 2 hours if headache persists or recurs. Patient not taking: Reported on 08/29/2014 05/09/14   Tresa Garter, MD   BP 107/63  mmHg  Pulse 83  Temp(Src) 98.2 F (36.8 C) (Oral)  Resp 18  SpO2 98%  LMP 11/27/2012 Physical Exam  Constitutional: She is oriented to person, place, and time. She appears well-developed and well-nourished.  HENT:  Head: Normocephalic. Head is without raccoon's eyes, without Battle's sign, without abrasion, without contusion and without laceration.    Mouth/Throat: Oropharynx is clear and moist.  Eyes: Conjunctivae are normal. Pupils are equal, round, and reactive to light.  Neck: Normal range of motion. Neck supple.  Cardiovascular: Normal rate, regular rhythm and normal heart sounds.   No murmur heard. Pulmonary/Chest: Effort normal and breath sounds normal. No accessory muscle usage or stridor. No respiratory distress. She has no wheezes. She has no rhonchi. She has no rales.  Abdominal: Soft. Bowel sounds are normal. She exhibits no distension. There is no tenderness.  Musculoskeletal: Normal range of motion.  Lymphadenopathy:    She has no cervical adenopathy.  Neurological: She is alert and oriented to person, place, and time.  Mental Status:   AOx3.  Speech  clear without dysarthria. Cranial Nerves:  I-not tested  II-PERRLA  III, IV, VI-EOMs intact  V-temporal and masseter strength intact  VII-symmetrical facial movements intact, no facial droop  VIII-hearing grossly intact bilaterally  IX, X-gag intact  XI-strength of sternomastoid and trapezius muscles 5/5  XII-tongue midline Motor:   Good muscle bulk and tone  Strength 5/5 bilaterally in upper and lower extremities   Cerebellar--RAMs, finger to nose intact  No pronator drift Sensory:  Intact in upper and lower extremities   Skin: Skin is warm and dry.  Psychiatric: She has a normal mood and affect. Her behavior is normal.    ED Course  Procedures (including critical care time) Labs Review Labs Reviewed  COMPREHENSIVE METABOLIC PANEL - Abnormal; Notable for the following:    Glucose, Bld 101 (*)    All other components within normal limits  CBC WITH DIFFERENTIAL/PLATELET - Abnormal; Notable for the following:    WBC 10.6 (*)    Platelets 450 (*)    All other components within normal limits  URINALYSIS, ROUTINE W REFLEX MICROSCOPIC (NOT AT [facility]) - Abnormal; Notable for the following:    Color, Urine ORANGE (*)    APPearance HAZY (*)    Bilirubin Urine SMALL (*)    All other components within normal limits  LIPASE, BLOOD  TROPONIN I  POCT CBG (FASTING - GLUCOSE)-MANUAL ENTRY  CBG MONITORING, ED    Imaging Review Dg Chest 2 View  02/07/2015  CLINICAL DATA:  Syncope, migraine headache, nausea and diarrhea for 2 days. EXAM: CHEST  2 VIEW COMPARISON:  03/02/2014 FINDINGS: Similar low lung volumes but no focal pneumonia, collapse or consolidation. Lungs remain clear. Normal heart size and vascularity. No effusion or pneumothorax. Trachea midline. Obese body habitus. IMPRESSION: Low lung volumes.  No acute chest process. Electronically Signed   By: Jerilynn Mages.  Shick M.D.   On: 02/07/2015 17:08   Ct Head Wo Contrast  02/07/2015  CLINICAL DATA:  Migraine headache, nausea and diarrhea  for the last 2 days. Near syncopal episode 4 hours ago with trauma to the head. EXAM: CT HEAD WITHOUT CONTRAST TECHNIQUE: Contiguous axial images were obtained from the base of the skull through the vertex without intravenous contrast. COMPARISON:  MRI 09/13/2014 FINDINGS: The brain has a normal appearance without evidence of malformation, atrophy, old or acute infarction, mass lesion, hemorrhage, hydrocephalus or extra-axial collection. The calvarium is unremarkable. The paranasal sinuses, middle ears and mastoids  are clear. IMPRESSION: Normal head CT Electronically Signed   By: Nelson Chimes M.D.   On: 02/07/2015 17:09   I have personally reviewed and evaluated these images and lab results as part of my medical decision-making.   EKG Interpretation   Date/Time:  Wednesday February 07 2015 17:32:25 EST Ventricular Rate:  87 PR Interval:  137 QRS Duration: 81 QT Interval:  370 QTC Calculation: 445 R Axis:   -19 Text Interpretation:  Sinus rhythm Borderline left axis deviation No  significant change was found Confirmed by CAMPOS  MD, Lennette Bihari ([id number]) on  02/07/2015 6:30:07 PM      MDM   Final diagnoses:  Nonintractable headache, unspecified chronicity pattern, unspecified headache type  Fall, initial encounter    Patient presents with migraine, syncopal fall, and LUE weakness and numbness for the past week. Patient with history of gait instability and weakness. VSS, NAD.  On exam, no focal neurological deficits.  Heart RRR, lungs CTAB, abdomen soft and benign.  Will obtain CT head, CXR, EKG, troponin, CMP, CBC, lipase, UA.  Will give fluids, zofran, reglan, and benadryl for symptomatic control.   CBG 88 CMP unremarkable.  CBC unremarkable. Troponin < 0.03.  EKG shows NSR, no acute changes or significant changes from previous. CT head--shows no evidence of infarction, mass lesion, or hemorrhage.  Will give toradol and decadron.  CXR shows no acute chest process.  Patient's symptoms have  improved, and patient ambulatory in ED without difficulty.  Will d/c home with Motrin and Zofran.  Doubt CVA.  Doubt other intracranial process.  Suspect migraine.  Evaluation does not show pathology requring ongoing emergent intervention or admission. Pt is hemodynamically stable and mentating appropriately. Discussed findings/results and plan with patient/guardian, who agrees with plan. All questions answered. Return precautions discussed and outpatient follow up given.  Case has been discussed with and seen by Dr. Venora Maples who agrees with the above plan for discharge.       Gloriann Loan, PA-C 02/07/15 2024  Gloriann Loan, PA-C 02/07/15 2029  Jola Schmidt, MD 02/07/15 2030

## 2015-02-07 NOTE — Discharge Instructions (Signed)

## 2015-03-15 ENCOUNTER — Other Ambulatory Visit: Payer: Self-pay | Admitting: Internal Medicine

## 2015-03-15 DIAGNOSIS — N644 Mastodynia: Secondary | ICD-10-CM

## 2015-03-26 ENCOUNTER — Ambulatory Visit: Payer: Self-pay | Attending: Internal Medicine | Admitting: Internal Medicine

## 2015-03-26 ENCOUNTER — Encounter: Payer: Self-pay | Admitting: Family Medicine

## 2015-03-26 VITALS — BP 111/74 | HR 88 | Temp 98.2°F | Resp 16 | Ht 65.0 in

## 2015-03-26 DIAGNOSIS — K589 Irritable bowel syndrome without diarrhea: Secondary | ICD-10-CM | POA: Insufficient documentation

## 2015-03-26 DIAGNOSIS — L219 Seborrheic dermatitis, unspecified: Secondary | ICD-10-CM

## 2015-03-26 DIAGNOSIS — L210 Seborrhea capitis: Secondary | ICD-10-CM

## 2015-03-26 DIAGNOSIS — M4806 Spinal stenosis, lumbar region: Secondary | ICD-10-CM | POA: Insufficient documentation

## 2015-03-26 DIAGNOSIS — Z8674 Personal history of sudden cardiac arrest: Secondary | ICD-10-CM | POA: Insufficient documentation

## 2015-03-26 DIAGNOSIS — R51 Headache: Secondary | ICD-10-CM | POA: Insufficient documentation

## 2015-03-26 DIAGNOSIS — Z86010 Personal history of colonic polyps: Secondary | ICD-10-CM | POA: Insufficient documentation

## 2015-03-26 DIAGNOSIS — K625 Hemorrhage of anus and rectum: Secondary | ICD-10-CM

## 2015-03-26 DIAGNOSIS — Z79899 Other long term (current) drug therapy: Secondary | ICD-10-CM | POA: Insufficient documentation

## 2015-03-26 DIAGNOSIS — G43809 Other migraine, not intractable, without status migrainosus: Secondary | ICD-10-CM

## 2015-03-26 DIAGNOSIS — G8929 Other chronic pain: Secondary | ICD-10-CM | POA: Insufficient documentation

## 2015-03-26 DIAGNOSIS — N6452 Nipple discharge: Secondary | ICD-10-CM

## 2015-03-26 DIAGNOSIS — E119 Type 2 diabetes mellitus without complications: Secondary | ICD-10-CM

## 2015-03-26 DIAGNOSIS — M545 Low back pain: Secondary | ICD-10-CM | POA: Insufficient documentation

## 2015-03-26 DIAGNOSIS — G43909 Migraine, unspecified, not intractable, without status migrainosus: Secondary | ICD-10-CM | POA: Insufficient documentation

## 2015-03-26 DIAGNOSIS — O009 Unspecified ectopic pregnancy without intrauterine pregnancy: Secondary | ICD-10-CM | POA: Insufficient documentation

## 2015-03-26 DIAGNOSIS — Z7984 Long term (current) use of oral hypoglycemic drugs: Secondary | ICD-10-CM | POA: Insufficient documentation

## 2015-03-26 DIAGNOSIS — R262 Difficulty in walking, not elsewhere classified: Secondary | ICD-10-CM

## 2015-03-26 DIAGNOSIS — J45909 Unspecified asthma, uncomplicated: Secondary | ICD-10-CM | POA: Insufficient documentation

## 2015-03-26 DIAGNOSIS — G4733 Obstructive sleep apnea (adult) (pediatric): Secondary | ICD-10-CM | POA: Insufficient documentation

## 2015-03-26 DIAGNOSIS — L218 Other seborrheic dermatitis: Secondary | ICD-10-CM | POA: Insufficient documentation

## 2015-03-26 DIAGNOSIS — Z7982 Long term (current) use of aspirin: Secondary | ICD-10-CM | POA: Insufficient documentation

## 2015-03-26 DIAGNOSIS — Z880 Allergy status to penicillin: Secondary | ICD-10-CM | POA: Insufficient documentation

## 2015-03-26 HISTORY — DX: Seborrhea capitis: L21.0

## 2015-03-26 LAB — GLUCOSE, POCT (MANUAL RESULT ENTRY): POC Glucose: 93 mg/dl (ref 70–99)

## 2015-03-26 LAB — TSH: TSH: 0.77 mIU/L

## 2015-03-26 MED ORDER — HYDROCORTISONE 0.5 % EX CREA: 1.0000 "application " | TOPICAL_CREAM | Freq: Two times a day (BID) | CUTANEOUS | Status: DC

## 2015-03-26 MED ORDER — METFORMIN HCL 500 MG PO TABS: 500.0000 mg | ORAL_TABLET | Freq: Two times a day (BID) | ORAL | Status: DC

## 2015-03-26 MED ORDER — SUMATRIPTAN SUCCINATE 50 MG PO TABS: 50.0000 mg | ORAL_TABLET | Freq: Once | ORAL | Status: DC

## 2015-03-26 MED ORDER — SALICYLIC ACID 3 % EX SHAM: 5.0000 mL | MEDICATED_SHAMPOO | CUTANEOUS | Status: DC

## 2015-03-26 MED ORDER — HYDROCORTISONE ACETATE 25 MG RE SUPP: 25.0000 mg | Freq: Two times a day (BID) | RECTAL | Status: DC

## 2015-03-26 NOTE — Progress Notes (Signed)
F/U rt nipple discharge x 3 wk. Swelling and pain bilateral  Rt Leg pain and weakness. Unable to walk  Memory  Loss  Pain scale #9 Former smoker  No suicidal thoughts in the past two weeks

## 2015-03-26 NOTE — Progress Notes (Signed)
Patient ID: Marisa [name], female   DOB: 11-26-1975, 40 y.o.   MRN: [id number]   Marisa [name], is a 39 y.o. female  [id number]  [id number]  DOB - 12-29-1975  Chief Complaint  Patient presents with  . Breast Discharge        Subjective:   Marisa [name] is a 39 y.o. female with medical history significant for chronic headaches, gait instability on wheelchair, IBS, diabetes mellitus, lumbar stenosis, right leg weakness, generalized anxiety and panic attacks here today for a follow up visit. Her major complaint today is bilateral nipple discharge for the past 3 weeks. Discharge is milky, no blood, no pain, no redness. She denies any headache today, no blurry vision or double vision. She is also complaining of right leg pain and weakness, she continues to be wheelchair because of right leg weakness and gait instability. She is also complaining of memory loss mostly transient and of recent events. Patient denies being depressed, denies any suicidal ideation or thoughts. Patient has No headache, No chest pain, No abdominal pain - No Nausea. She also complained of flaky stuff coming out of her hair after combing. She also needs refill of her medications.   Problem  Dandruff  Nipple Discharge in Female    ALLERGIES: Allergies  Allergen Reactions  . Penicillins Anaphylaxis  . Shellfish Allergy Anaphylaxis  . Other     Mushroom-- swelling throat, eyes Animals with fur -- swelling throat, eyes    PAST MEDICAL HISTORY: Past Medical History  Diagnosis Date  . Chronic headaches   . Anemia   . Gait instability   . IBS (irritable bowel syndrome)   . History of ectopic pregnancy     2009-  S/P LEFT SALPINGECTOMY  . History of cardiac arrest     during SVD 1992  . Type 2 diabetes mellitus ([place])   . History of adenomatous polyp of colon   . Anal pain     chronic  . Mild obstructive sleep apnea     study 03-20-2014  no cpap recommended  . Lumbar stenosis L4 -- L5 with  bulging disk    w/ right leg weakness/ decreased mobility  . Chronic low back pain   . Cyst of right ovary   . Asthma     exacerbation 02-28-2014 and 02-23-2014 secondary to Rhinovirus  . Anxiety   . History of panic attacks   . Weakness of right leg     FROM BACK PROBLEM PER PT  . Difficult intravenous access     PER PT NEEDS PICC LINE    MEDICATIONS AT HOME: Prior to Admission medications   Medication Sig Start Date End Date Taking? Authorizing Provider  acetaminophen (TYLENOL) 500 MG tablet Take 1,000 mg by mouth every 6 (six) hours as needed for headache.   Yes Historical Provider, MD  albuterol (PROVENTIL HFA;VENTOLIN HFA) 108 (90 BASE) MCG/ACT inhaler Inhale 1-2 puffs into the lungs every 6 (six) hours as needed for wheezing or shortness of breath. 02/23/14  Yes Robbie Lis, MD  ARIPiprazole (ABILIFY) 10 MG tablet Take 1 tablet (10 mg total) by mouth daily. 08/29/14 08/29/15 Yes Leonides Grills, MD  aspirin-acetaminophen-caffeine (EXCEDRIN MIGRAINE) [phone] MG tablet Take 2 tablets by mouth every 6 (six) hours as needed for headache.   Yes Historical Provider, MD  budesonide-formoterol (SYMBICORT) 160-4.5 MCG/ACT inhaler Inhale 2 puffs into the lungs 2 (two) times daily. 04/17/14  Yes Kathee Delton, MD  citalopram (CELEXA) 20 MG tablet Take 1  tablet (20 mg total) by mouth daily. 08/29/14 08/29/15 Yes Leonides Grills, MD  ipratropium-albuterol (DUONEB) 0.5-2.5 (3) MG/3ML SOLN Take 3 mLs by nebulization every 6 (six) hours as needed. 02/23/14  Yes Robbie Lis, MD  lithium carbonate (LITHOBID) 300 MG CR tablet Take 1 tablet (300 mg total) by mouth at bedtime. 08/29/14  Yes Leonides Grills, MD  hydrocortisone (ANUSOL-HC) 25 MG suppository Place 1 suppository (25 mg total) rectally 2 (two) times daily. 03/26/15   Tresa Garter, MD  ibuprofen (ADVIL,MOTRIN) 400 MG tablet Take 1 tablet (400 mg total) by mouth 3 (three) times daily. Patient not taking: Reported on 03/26/2015  02/07/15   Gloriann Loan, PA-C  metFORMIN (GLUCOPHAGE) 500 MG tablet Take 1 tablet (500 mg total) by mouth 2 (two) times daily with a meal. 03/26/15   Tresa Garter, MD  ondansetron (ZOFRAN) 4 MG tablet Take 1 tablet (4 mg total) by mouth every 6 (six) hours. Patient not taking: Reported on 03/26/2015 02/07/15   Gloriann Loan, PA-C  Salicylic Acid (SELSUN BLUE DEEP CLEANSING) 3 % SHAM Apply 5 mLs topically 2 (two) times a week. 03/26/15   Tresa Garter, MD  SUMAtriptan (IMITREX) 50 MG tablet Take 1 tablet (50 mg total) by mouth once. May repeat in 2 hours if headache persists or recurs. 03/26/15   Tresa Garter, MD     Objective:   Filed Vitals:   03/26/15 1054  BP: 111/74  Pulse: 88  Temp: 98.2 F (36.8 C)  TempSrc: Oral  Resp: 16  Height: 5\' 5"  (1.651 m)  SpO2: 97%    Exam General appearance : Awake, alert, not in any distress. Speech Clear. Not toxic looking, morbidly obese, on wheelchair HEENT: Atraumatic and Normocephalic, pupils equally reactive to light and accomodation Neck: supple, no JVD. No cervical lymphadenopathy.  Chest:Good air entry bilaterally, no added sounds  CVS: S1 S2 regular, no murmurs.  Abdomen: Bowel sounds present, Non tender and not distended with no gaurding, rigidity or rebound. Extremities: B/L Lower Ext shows no edema, both legs are warm to touch Neurology: Awake alert, and oriented X 3, CN II-XII intact, Non focal  Data Review Lab Results  Component Value Date   HGBA1C 6.2* 02/20/2014   HGBA1C 6.5* 09/29/2013     Assessment & Plan   1. Well controlled type 2 diabetes mellitus ([place])  - Glucose (CBG) - metFORMIN (GLUCOPHAGE) 500 MG tablet; Take 1 tablet (500 mg total) by mouth 2 (two) times daily with a meal.  Dispense: 180 tablet; Refill: 3  2. Migraine variant with headache  - SUMAtriptan (IMITREX) 50 MG tablet; Take 1 tablet (50 mg total) by mouth once. May repeat in 2 hours if headache persists or recurs.  Dispense: 20 tablet;  Refill: 0  3. Unable to ambulate  Patient on Wheelchair  4. Rectal bleeding  - hydrocortisone (ANUSOL-HC) 25 MG suppository; Place 1 suppository (25 mg total) rectally 2 (two) times daily.  Dispense: 30 suppository; Refill: 3  5. Dandruff  - Salicylic Acid (SELSUN BLUE DEEP CLEANSING) 3 % SHAM; Apply 5 mLs topically 2 (two) times a week.  Dispense: 473 mL; Refill: 3  6. Nipple discharge in female  - MM Digital Diagnostic Bilat; Future - Prolactin - TSH  Patient have been counseled extensively about nutrition and exercise  Return in about 3 months (around 06/23/2015) for Follow up Pain and comorbidities, Annual Physical, Hemoglobin A1C and Follow up, DM.  The patient was given clear instructions to  go to ER or return to medical center if symptoms don't improve, worsen or new problems develop. The patient verbalized understanding. The patient was told to call to get lab results if they haven't heard anything in the next week.   This note has been created with Surveyor, quantity. Any transcriptional errors are unintentional.    Angelica Chessman, MD, [employer], Karilyn Cota, [place] and [facility] [place], [place]   03/26/2015, 11:56 AM

## 2015-03-26 NOTE — Progress Notes (Deleted)
   Subjective:  Patient ID: Marisa [name], female    DOB: 1975-10-24  Age: 40 y.o. MRN: [id number]  CC: Breast Discharge   HPI JAMARRA DAUTEL presents for      Social History  Substance Use Topics  . Smoking status: Former Smoker -- 0.20 packs/day for 11 years    Types: Cigarettes    Quit date: 11/17/2013  . Smokeless tobacco: Never Used  . Alcohol Use: No   Outpatient Prescriptions Prior to Visit  Medication Sig Dispense Refill  . acetaminophen (TYLENOL) 500 MG tablet Take 1,000 mg by mouth every 6 (six) hours as needed for headache.    . albuterol (PROVENTIL HFA;VENTOLIN HFA) 108 (90 BASE) MCG/ACT inhaler Inhale 1-2 puffs into the lungs every 6 (six) hours as needed for wheezing or shortness of breath. 1 Inhaler 1  . ARIPiprazole (ABILIFY) 10 MG tablet Take 1 tablet (10 mg total) by mouth daily. 30 tablet 2  . aspirin-acetaminophen-caffeine (EXCEDRIN MIGRAINE) [id number] MG tablet Take 2 tablets by mouth every 6 (six) hours as needed for headache.    . budesonide-formoterol (SYMBICORT) 160-4.5 MCG/ACT inhaler Inhale 2 puffs into the lungs 2 (two) times daily. 1 Inhaler 12  . citalopram (CELEXA) 20 MG tablet Take 1 tablet (20 mg total) by mouth daily. 30 tablet 2  . ipratropium-albuterol (DUONEB) 0.5-2.5 (3) MG/3ML SOLN Take 3 mLs by nebulization every 6 (six) hours as needed. 360 mL 0  . lithium carbonate (LITHOBID) 300 MG CR tablet Take 1 tablet (300 mg total) by mouth at bedtime. 30 tablet 1  . ibuprofen (ADVIL,MOTRIN) 400 MG tablet Take 1 tablet (400 mg total) by mouth 3 (three) times daily. (Patient not taking: Reported on 03/26/2015) 6 tablet 0  . metFORMIN (GLUCOPHAGE) 500 MG tablet Take 1 tablet (500 mg total) by mouth 2 (two) times daily with a meal. (Patient not taking: Reported on 03/26/2015) 180 tablet 3  . ondansetron (ZOFRAN) 4 MG tablet Take 1 tablet (4 mg total) by mouth every 6 (six) hours. (Patient not taking: Reported on 03/26/2015) 12 tablet 0  . SUMAtriptan  (IMITREX) 50 MG tablet Take 1 tablet (50 mg total) by mouth once. May repeat in 2 hours if headache persists or recurs. (Patient not taking: Reported on 08/29/2014) 20 tablet 0   No facility-administered medications prior to visit.    ROS Review of Systems  Objective:  BP 111/74 mmHg  Pulse 88  Temp(Src) 98.2 F (36.8 C) (Oral)  Resp 16  Ht 5\' 5"  (1.651 m)  SpO2 97%  LMP 11/27/2012  BP/Weight 03/26/2015 02/07/2015 [id number]  Systolic BP [id number] -  Diastolic BP 74 87 -  Wt. (Lbs) - - 249  BMI - - 41.44  Some encounter information is confidential and restricted. Go to Review Flowsheets activity to see all data.    {CHL AMB CHW Note lists:[id number]}  Physical Exam   Assessment & Plan:   There are no diagnoses linked to this encounter.  No orders of the defined types were placed in this encounter.    Follow-up: No Follow-up on file.   Boykin Nearing MD

## 2015-03-26 NOTE — Patient Instructions (Signed)
Diabetes and Exercise Exercising regularly is important. It is not just about losing weight. It has many health benefits, such as:  Improving your overall fitness, flexibility, and endurance.  Increasing your bone density.  Helping with weight control.  Decreasing your body fat.  Increasing your muscle strength.  Reducing stress and tension.  Improving your overall health. People with diabetes who exercise gain additional benefits because exercise:  Reduces appetite.  Improves the body's use of blood sugar (glucose).  Helps lower or control blood glucose.  Decreases blood pressure.  Helps control blood lipids (such as cholesterol and triglycerides).  Improves the body's use of the hormone insulin by:  Increasing the body's insulin sensitivity.  Reducing the body's insulin needs.  Decreases the risk for heart disease because exercising:  Lowers cholesterol and triglycerides levels.  Increases the levels of good cholesterol (such as high-density lipoproteins [HDL]) in the body.  Lowers blood glucose levels. YOUR ACTIVITY PLAN  Choose an activity that you enjoy, and set realistic goals. To exercise safely, you should begin practicing any new physical activity slowly, and gradually increase the intensity of the exercise over time. Your health care provider or diabetes educator can help create an activity plan that works for you. General recommendations include:  Encouraging children to engage in at least 60 minutes of physical activity each day.  Stretching and performing strength training exercises, such as yoga or weight lifting, at least 2 times per week.  Performing a total of at least 150 minutes of moderate-intensity exercise each week, such as brisk walking or water aerobics.  Exercising at least 3 days per week, making sure you allow no more than 2 consecutive days to pass without exercising.  Avoiding long periods of inactivity (90 minutes or more). When you  have to spend an extended period of time sitting down, take frequent breaks to walk or stretch. RECOMMENDATIONS FOR EXERCISING WITH TYPE 1 OR TYPE 2 DIABETES   Check your blood glucose before exercising. If blood glucose levels are greater than 240 mg/dL, check for urine ketones. Do not exercise if ketones are present.  Avoid injecting insulin into areas of the body that are going to be exercised. For example, avoid injecting insulin into:  The arms when playing tennis.  The legs when jogging.  Keep a record of:  Food intake before and after you exercise.  Expected peak times of insulin action.  Blood glucose levels before and after you exercise.  The type and amount of exercise you have done.  Review your records with your health care provider. Your health care provider will help you to develop guidelines for adjusting food intake and insulin amounts before and after exercising.  If you take insulin or oral hypoglycemic agents, watch for signs and symptoms of hypoglycemia. They include:  Dizziness.  Shaking.  Sweating.  Chills.  Confusion.  Drink plenty of water while you exercise to prevent dehydration or heat stroke. Body water is lost during exercise and must be replaced.  Talk to your health care provider before starting an exercise program to make sure it is safe for you. Remember, almost any type of activity is better than none.   This information is not intended to replace advice given to you by your health care provider. Make sure you discuss any questions you have with your health care provider.   Document Released: 04/26/2003 Document Revised: 06/20/2014 Document Reviewed: 07/13/2012 Elsevier Interactive Patient Education 2016 [employer] Basic Carbohydrate Counting for Diabetes Mellitus Carbohydrate counting   is a method for keeping track of the amount of carbohydrates you eat. Eating carbohydrates naturally increases the level of sugar (glucose) in your  blood, so it is important for you to know the amount that is okay for you to have in every meal. Carbohydrate counting helps keep the level of glucose in your blood within normal limits. The amount of carbohydrates allowed is different for every person. A dietitian can help you calculate the amount that is right for you. Once you know the amount of carbohydrates you can have, you can count the carbohydrates in the foods you want to eat. Carbohydrates are found in the following foods:  Grains, such as breads and cereals.  Dried beans and soy products.  Starchy vegetables, such as potatoes, peas, and corn.  Fruit and fruit juices.  Milk and yogurt.  Sweets and snack foods, such as cake, cookies, candy, chips, soft drinks, and fruit drinks. CARBOHYDRATE COUNTING There are two ways to count the carbohydrates in your food. You can use either of the methods or a combination of both. Reading the "Nutrition Facts" on Packaged Food The "Nutrition Facts" is an area that is included on the labels of almost all packaged food and beverages in the [place]. It includes the serving size of that food or beverage and information about the nutrients in each serving of the food, including the grams (g) of carbohydrate per serving.  Decide the number of servings of this food or beverage that you will be able to eat or drink. Multiply that number of servings by the number of grams of carbohydrate that is listed on the label for that serving. The total will be the amount of carbohydrates you will be having when you eat or drink this food or beverage. Learning Standard Serving Sizes of Food When you eat food that is not packaged or does not include "Nutrition Facts" on the label, you need to measure the servings in order to count the amount of carbohydrates.A serving of most carbohydrate-rich foods contains about 15 g of carbohydrates. The following list includes serving sizes of carbohydrate-rich foods that  provide 15 g ofcarbohydrate per serving:   1 slice of bread (1 oz) or 1 six-inch tortilla.    of a hamburger bun or English muffin.  4-6 crackers.   cup unsweetened dry cereal.    cup hot cereal.   cup rice or pasta.    cup mashed potatoes or  of a large baked potato.  1 cup fresh fruit or one small piece of fruit.    cup canned or frozen fruit or fruit juice.  1 cup milk.   cup plain fat-free yogurt or yogurt sweetened with artificial sweeteners.   cup cooked dried beans or starchy vegetable, such as peas, corn, or potatoes.  Decide the number of standard-size servings that you will eat. Multiply that number of servings by 15 (the grams of carbohydrates in that serving). For example, if you eat 2 cups of strawberries, you will have eaten 2 servings and 30 g of carbohydrates (2 servings x 15 g = 30 g). For foods such as soups and casseroles, in which more than one food is mixed in, you will need to count the carbohydrates in each food that is included. EXAMPLE OF CARBOHYDRATE COUNTING Sample Dinner  3 oz chicken breast.   cup of brown rice.   cup of corn.  1 cup milk.   1 cup strawberries with sugar-free whipped topping.  Carbohydrate Calculation Step   1: Identify the foods that contain carbohydrates:   Rice.   Corn.   Milk.   Strawberries. Step 2:Calculate the number of servings eaten of each:   2 servings of rice.   1 serving of corn.   1 serving of milk.   1 serving of strawberries. Step 3: Multiply each of those number of servings by 15 g:   2 servings of rice x 15 g = 30 g.   1 serving of corn x 15 g = 15 g.   1 serving of milk x 15 g = 15 g.   1 serving of strawberries x 15 g = 15 g. Step 4: Add together all of the amounts to find the total grams of carbohydrates eaten: 30 g + 15 g + 15 g + 15 g = 75 g.   This information is not intended to replace advice given to you by your health care provider. Make sure you  discuss any questions you have with your health care provider.   Document Released: 02/03/2005 Document Revised: 02/24/2014 Document Reviewed: 12/31/2012 Elsevier Interactive Patient Education 2016 Elsevier Inc.  

## 2015-03-27 LAB — PROLACTIN: Prolactin: 7.3 ng/mL

## 2015-04-02 ENCOUNTER — Telehealth: Payer: Self-pay | Admitting: Internal Medicine

## 2015-04-02 NOTE — Telephone Encounter (Signed)
Patient called requesting status of disability letter stating that she will permentantly will be out of work that was requested during visit. Patient would like letter faxed to the disability office at [phone] and patient is also requesting a copy of the letter.

## 2015-04-10 NOTE — Telephone Encounter (Signed)
Patient verified DOB Patient is aware of lab results being normal. Patient also made aware of disability letter being faxed to number provided by patient as well as the original copy being placed at the front desk for patient to pick up for her records. Patient expressed her understanding and had no further questions at this time.

## 2015-04-10 NOTE — Telephone Encounter (Signed)
-----   Message from Tresa Garter, MD sent at 04/02/2015  3:33 PM EST ----- Please inform patient that her laboratory results are normal.

## 2015-04-13 ENCOUNTER — Encounter: Payer: Self-pay | Admitting: Clinical

## 2015-04-13 NOTE — Progress Notes (Signed)
Depression screen [facility] 2/9 03/26/2015 09/29/2013  Decreased Interest 2 3  Down, Depressed, Hopeless 3 3  PHQ - 2 Score 5 6  Altered sleeping 3 3  Tired, decreased energy 3 3  Change in appetite 3 2  Feeling bad or failure about yourself  3 2  Trouble concentrating 2 2  Moving slowly or fidgety/restless 1 1  Suicidal thoughts 0 0  PHQ-9 Score 20 19    GAD 7 : Generalized Anxiety Score 03/26/2015  Nervous, Anxious, on Edge 3  Control/stop worrying 3  Worry too much - different things 3  Trouble relaxing 2  Restless 1  Easily annoyed or irritable 2  Afraid - awful might happen 2  Total GAD 7 Score 16

## 2015-06-05 ENCOUNTER — Other Ambulatory Visit (HOSPITAL_COMMUNITY): Payer: Self-pay | Admitting: *Deleted

## 2015-06-05 DIAGNOSIS — N6310 Unspecified lump in the right breast, unspecified quadrant: Secondary | ICD-10-CM

## 2015-06-05 DIAGNOSIS — N644 Mastodynia: Secondary | ICD-10-CM

## 2015-06-05 DIAGNOSIS — N6320 Unspecified lump in the left breast, unspecified quadrant: Principal | ICD-10-CM

## 2015-06-20 ENCOUNTER — Telehealth: Payer: Self-pay | Admitting: Internal Medicine

## 2015-06-20 NOTE — Telephone Encounter (Signed)
Patient needs a letter for [employer] stating that she is unable to return to work due to her medical condition...Marland KitchenMarland KitchenMarland Kitchenplease follow up

## 2015-06-21 ENCOUNTER — Ambulatory Visit (HOSPITAL_COMMUNITY): Payer: 59 | Attending: Internal Medicine

## 2015-06-21 ENCOUNTER — Other Ambulatory Visit: Payer: Self-pay

## 2015-06-27 ENCOUNTER — Other Ambulatory Visit: Payer: Self-pay

## 2015-07-02 NOTE — Telephone Encounter (Signed)
Patient is folowing up on request for letter to social services stating she is unable to work due to her medical condition

## 2015-07-10 ENCOUNTER — Other Ambulatory Visit (HOSPITAL_COMMUNITY): Payer: Self-pay | Admitting: *Deleted

## 2015-07-12 ENCOUNTER — Encounter: Payer: Self-pay | Admitting: Internal Medicine

## 2015-07-12 ENCOUNTER — Ambulatory Visit: Payer: Self-pay | Attending: Internal Medicine | Admitting: Internal Medicine

## 2015-07-12 VITALS — BP 108/75 | HR 92 | Temp 98.5°F | Resp 18 | Ht 65.0 in | Wt 270.0 lb

## 2015-07-12 DIAGNOSIS — E119 Type 2 diabetes mellitus without complications: Secondary | ICD-10-CM

## 2015-07-12 DIAGNOSIS — G43809 Other migraine, not intractable, without status migrainosus: Secondary | ICD-10-CM

## 2015-07-12 DIAGNOSIS — R21 Rash and other nonspecific skin eruption: Secondary | ICD-10-CM

## 2015-07-12 DIAGNOSIS — M4806 Spinal stenosis, lumbar region: Secondary | ICD-10-CM | POA: Insufficient documentation

## 2015-07-12 DIAGNOSIS — N764 Abscess of vulva: Secondary | ICD-10-CM | POA: Insufficient documentation

## 2015-07-12 DIAGNOSIS — G43909 Migraine, unspecified, not intractable, without status migrainosus: Secondary | ICD-10-CM | POA: Insufficient documentation

## 2015-07-12 DIAGNOSIS — Z880 Allergy status to penicillin: Secondary | ICD-10-CM | POA: Insufficient documentation

## 2015-07-12 DIAGNOSIS — E118 Type 2 diabetes mellitus with unspecified complications: Secondary | ICD-10-CM | POA: Insufficient documentation

## 2015-07-12 DIAGNOSIS — W19XXXA Unspecified fall, initial encounter: Secondary | ICD-10-CM

## 2015-07-12 DIAGNOSIS — Z79899 Other long term (current) drug therapy: Secondary | ICD-10-CM | POA: Insufficient documentation

## 2015-07-12 DIAGNOSIS — Z91013 Allergy to seafood: Secondary | ICD-10-CM | POA: Insufficient documentation

## 2015-07-12 HISTORY — DX: Abscess of vulva: N76.4

## 2015-07-12 HISTORY — DX: Rash and other nonspecific skin eruption: R21

## 2015-07-12 LAB — HEMOGLOBIN A1C
Hgb A1c MFr Bld: 7 % — ABNORMAL HIGH (ref <5.7)
Mean Plasma Glucose: 154 mg/dL

## 2015-07-12 LAB — GLUCOSE, POCT (MANUAL RESULT ENTRY): POC Glucose: 107 mg/dl — AB (ref 70–99)

## 2015-07-12 MED ORDER — SULFAMETHOXAZOLE-TRIMETHOPRIM 800-160 MG PO TABS: 1.0000 | ORAL_TABLET | Freq: Two times a day (BID) | ORAL | Status: AC

## 2015-07-12 MED ORDER — TRIAMCINOLONE ACETONIDE 0.5 % EX OINT: 1.0000 "application " | TOPICAL_OINTMENT | Freq: Two times a day (BID) | CUTANEOUS | Status: DC

## 2015-07-12 MED ORDER — IBUPROFEN 400 MG PO TABS: 400.0000 mg | ORAL_TABLET | Freq: Three times a day (TID) | ORAL | Status: DC

## 2015-07-12 NOTE — Progress Notes (Signed)
Patient ID: Marisa [name], female   DOB: [birth year], 40 y.o.   MRN: [id number]   Marisa [name], is a 40 y.o. female  [id number]  [id number]  DOB - [birth year]  Chief Complaint  Patient presents with  . Follow-up    DM        Subjective:   Marisa [name] is a 40 y.o. female with multiple medical history including type 2 diabetes mellitus on metformin, lumbar stenosis with bilateral lower Limb weakness wheelchair bound, morbid obesity, major depression and anxiety, chronic migraine headache here today for a follow up visit. She is complaining of a boil on her left labia for the past 4 weeks, multiple, one bursted spontaneously, very painful. She denies any fever. No history of trauma. No vaginal discharge. She is not sexually active since the death of her husband over a year ago. She said she fell a few weeks ago, felt dizzy afterward, she did not visit the emergency room. She had no loss of consciousness. She has since been feeling better but still has headache, although this headache feels like her migraine headache. Patient has No chest pain, No abdominal pain - No Nausea, No new weakness tingling or numbness, No Cough - SOB.  Problem  Carbuncle of Labium  Rash, Skin    ALLERGIES: Allergies  Allergen Reactions  . Penicillins Anaphylaxis  . Shellfish Allergy Anaphylaxis  . Other     Mushroom-- swelling throat, eyes Animals with fur -- swelling throat, eyes    PAST MEDICAL HISTORY: Past Medical History  Diagnosis Date  . Chronic headaches   . Anemia   . Gait instability   . IBS (irritable bowel syndrome)   . History of ectopic pregnancy     2009-  S/P LEFT SALPINGECTOMY  . History of cardiac arrest     during SVD 1992  . Type 2 diabetes mellitus (HCC)   . History of adenomatous polyp of colon   . Anal pain     chronic  . Mild obstructive sleep apnea     study 03-20-2014  no cpap recommended  . Lumbar stenosis L4 -- L5 with bulging disk    w/ right leg  weakness/ decreased mobility  . Chronic low back pain   . Cyst of right ovary   . Asthma     exacerbation 02-28-2014 and 02-23-2014 secondary to Rhinovirus  . Anxiety   . History of panic attacks   . Weakness of right leg     FROM BACK PROBLEM PER PT  . Difficult intravenous access     PER PT NEEDS PICC LINE    MEDICATIONS AT HOME: Prior to Admission medications   Medication Sig Start Date End Date Taking? Authorizing Provider  acetaminophen (TYLENOL) 500 MG tablet Take 1,000 mg by mouth every 6 (six) hours as needed for headache.   Yes Historical Provider, MD  albuterol (PROVENTIL HFA;VENTOLIN HFA) 108 (90 BASE) MCG/ACT inhaler Inhale 1-2 puffs into the lungs every 6 (six) hours as needed for wheezing or shortness of breath. 02/23/14  Yes Alison MurrayAlma M Devine, MD  ARIPiprazole (ABILIFY) 10 MG tablet Take 1 tablet (10 mg total) by mouth daily. 08/29/14 08/29/15 Yes Gayland CurryGayathri D Tadepalli, MD  aspirin-acetaminophen-caffeine (EXCEDRIN MIGRAINE) [phone] MG tablet Take 2 tablets by mouth every 6 (six) hours as needed for headache.   Yes Historical Provider, MD  budesonide-formoterol (SYMBICORT) 160-4.5 MCG/ACT inhaler Inhale 2 puffs into the lungs 2 (two) times daily. 04/17/14  Yes Barbaraann ShareKeith M Clance, MD  citalopram (CELEXA)  20 MG tablet Take 1 tablet (20 mg total) by mouth daily. 08/29/14 08/29/15 Yes Leonides Grills, MD  hydrocortisone (ANUSOL-HC) 25 MG suppository Place 1 suppository (25 mg total) rectally 2 (two) times daily. 03/26/15  Yes Tresa Garter, MD  ibuprofen (ADVIL,MOTRIN) 400 MG tablet Take 1 tablet (400 mg total) by mouth 3 (three) times daily. 07/12/15  Yes Tresa Garter, MD  ipratropium-albuterol (DUONEB) 0.5-2.5 (3) MG/3ML SOLN Take 3 mLs by nebulization every 6 (six) hours as needed. 02/23/14  Yes Robbie Lis, MD  lithium carbonate (LITHOBID) 300 MG CR tablet Take 1 tablet (300 mg total) by mouth at bedtime. 08/29/14  Yes Leonides Grills, MD  metFORMIN (GLUCOPHAGE) 500 MG  tablet Take 1 tablet (500 mg total) by mouth 2 (two) times daily with a meal. 03/26/15  Yes Denya Buckingham E Doreene Burke, MD  ondansetron (ZOFRAN) 4 MG tablet Take 1 tablet (4 mg total) by mouth every 6 (six) hours. 02/07/15  Yes Gloriann Loan, PA-C  Salicylic Acid (SELSUN BLUE DEEP CLEANSING) 3 % SHAM Apply 5 mLs topically 2 (two) times a week. 03/26/15  Yes Tresa Garter, MD  SUMAtriptan (IMITREX) 50 MG tablet Take 1 tablet (50 mg total) by mouth once. May repeat in 2 hours if headache persists or recurs. 03/26/15  Yes Tresa Garter, MD  sulfamethoxazole-trimethoprim (BACTRIM DS,SEPTRA DS) 800-160 MG tablet Take 1 tablet by mouth 2 (two) times daily. 07/12/15 07/19/15  Tresa Garter, MD  triamcinolone ointment (KENALOG) 0.5 % Apply 1 application topically 2 (two) times daily. 07/12/15   Tresa Garter, MD     Objective:   Filed Vitals:   07/12/15 1031  BP: 108/75  Pulse: 92  Temp: 98.5 F (36.9 C)  TempSrc: Oral  Resp: 18  Height: 5\' 5"  (1.651 m)  Weight: 270 lb (122.471 kg)  SpO2: 97%    Exam General appearance : Awake, alert, not in any distress. Speech Clear. Not toxic looking, wheelchair bound, morbidly obese HEENT: Atraumatic and Normocephalic, pupils equally reactive to light and accomodation Neck: supple, no JVD. No cervical lymphadenopathy.  Chest:Good air entry bilaterally, no added sounds  CVS: S1 S2 regular, no murmurs.  Abdomen: Bowel sounds present, Non tender and not distended with no gaurding, rigidity or rebound. Extremities: B/L Lower Ext shows no edema, both legs are warm to touch Neurology: Awake alert, and oriented X 3, CN II-XII intact, Non focal Skin: Rash on right leg, discoid  Data Review Lab Results  Component Value Date   HGBA1C 6.2* 02/20/2014   HGBA1C 6.5* 09/29/2013     Assessment & Plan   1. Diabetes mellitus without complication (HCC)  - Glucose (CBG) - Hemoglobin A1c - Microalbumin/Creatinine Ratio, Urine  Aim for 30 minutes of  exercise most days. Rethink what you drink. Water is great! Aim for 2-3 Carb Choices per meal (30-45 grams) +/- 1 either way  Aim for 0-15 Carbs per snack if hungry  Include protein in moderation with your meals and snacks  Consider reading food labels for Total Carbohydrate and Fat Grams of foods  Consider checking BG at alternate times per day  Continue taking medication as directed Be mindful about how much sugar you are adding to beverages and other foods. Fruit Punch - find one with no sugar  Measure and decrease portions of carbohydrate foods  Make your plate and don't go back for seconds   2. Falls, initial encounter  Patient counseled about fall precautions Ambulatory referral to physical  therapy  3. Migraine variant with headache  - ibuprofen (ADVIL,MOTRIN) 400 MG tablet; Take 1 tablet (400 mg total) by mouth 3 (three) times daily.  Dispense: 30 tablet; Refill: 0  4. Carbuncle of labium  - Cervicovaginal ancillary only - sulfamethoxazole-trimethoprim (BACTRIM DS,SEPTRA DS) 800-160 MG tablet; Take 1 tablet by mouth 2 (two) times daily.  Dispense: 14 tablet; Refill: 0 - ibuprofen (ADVIL,MOTRIN) 400 MG tablet; Take 1 tablet (400 mg total) by mouth 3 (three) times daily.  Dispense: 30 tablet; Refill: 0  5. Rash, skin  - triamcinolone ointment (KENALOG) 0.5 %; Apply 1 application topically 2 (two) times daily.  Dispense: 30 g; Refill: 1  Patient have been counseled extensively about nutrition and exercise  Return in about 6 months (around 01/12/2016) for Follow up Pain and comorbidities, Hemoglobin A1C and Follow up, DM.  The patient was given clear instructions to go to ER or return to medical center if symptoms don't improve, worsen or new problems develop. The patient verbalized understanding. The patient was told to call to get lab results if they haven't heard anything in the next week.   This note has been created with Human resources officer. Any transcriptional errors are unintentional.    Angelica Chessman, MD, [place], [place], [place], [place] and [facility] [place], [place]   07/12/2015, 11:13 AM

## 2015-07-12 NOTE — Progress Notes (Signed)
Patient is here for FU Pain and DM  Patient complains of a HA being present for a week and a half due to a fall she encountered while attempting to stand. Patient states she fell and hit the back of her head.  Patient complains of a spot on her right ankle which is dry, itchy and painful.  Patient also complains of boils on her labias being present for the past 4 weeks. Patient has used epsom salt and hot water to relieve discomfort with no relief.

## 2015-07-12 NOTE — Patient Instructions (Signed)
Abscess An abscess is an infected area that contains a collection of pus and debris.It can occur in almost any part of the body. An abscess is also known as a furuncle or boil. CAUSES  An abscess occurs when tissue gets infected. This can occur from blockage of oil or sweat glands, infection of hair follicles, or a minor injury to the skin. As the body tries to fight the infection, pus collects in the area and creates pressure under the skin. This pressure causes pain. People with weakened immune systems have difficulty fighting infections and get certain abscesses more often.  SYMPTOMS Usually an abscess develops on the skin and becomes a painful mass that is red, warm, and tender. If the abscess forms under the skin, you may feel a moveable soft area under the skin. Some abscesses break open (rupture) on their own, but most will continue to get worse without care. The infection can spread deeper into the body and eventually into the bloodstream, causing you to feel ill.  DIAGNOSIS  Your caregiver will take your medical history and perform a physical exam. A sample of fluid may also be taken from the abscess to determine what is causing your infection. TREATMENT  Your caregiver may prescribe antibiotic medicines to fight the infection. However, taking antibiotics alone usually does not cure an abscess. Your caregiver may need to make a small cut (incision) in the abscess to drain the pus. In some cases, gauze is packed into the abscess to reduce pain and to continue draining the area. HOME CARE INSTRUCTIONS   Only take over-the-counter or prescription medicines for pain, discomfort, or fever as directed by your caregiver.  If you were prescribed antibiotics, take them as directed. Finish them even if you start to feel better.  If gauze is used, follow your caregiver's directions for changing the gauze.  To avoid spreading the infection:  Keep your draining abscess covered with a  bandage.  Wash your hands well.  Do not share personal care items, towels, or whirlpools with others.  Avoid skin contact with others.  Keep your skin and clothes clean around the abscess.  Keep all follow-up appointments as directed by your caregiver. SEEK MEDICAL CARE IF:   You have increased pain, swelling, redness, fluid drainage, or bleeding.  You have muscle aches, chills, or a general ill feeling.  You have a fever. MAKE SURE YOU:   Understand these instructions.  Will watch your condition.  Will get help right away if you are not doing well or get worse.   This information is not intended to replace advice given to you by your health care provider. Make sure you discuss any questions you have with your health care provider.   Document Released: 11/13/2004 Document Revised: 08/05/2011 Document Reviewed: 04/18/2011 Elsevier Interactive Patient Education 2016 [place] for Diabetes Mellitus Carbohydrate counting is a method for keeping track of the amount of carbohydrates you eat. Eating carbohydrates naturally increases the level of sugar (glucose) in your blood, so it is important for you to know the amount that is okay for you to have in every meal. Carbohydrate counting helps keep the level of glucose in your blood within normal limits. The amount of carbohydrates allowed is different for every person. A dietitian can help you calculate the amount that is right for you. Once you know the amount of carbohydrates you can have, you can count the carbohydrates in the foods you want to eat. Carbohydrates are found  in the following foods:  Grains, such as breads and cereals.  Dried beans and soy products.  Starchy vegetables, such as potatoes, peas, and corn.  Fruit and fruit juices.  Milk and yogurt.  Sweets and snack foods, such as cake, cookies, candy, chips, soft drinks, and fruit drinks. CARBOHYDRATE COUNTING There are two ways to  count the carbohydrates in your food. You can use either of the methods or a combination of both. Reading the "Nutrition Facts" on [place] The "Nutrition Facts" is an area that is included on the labels of almost all packaged food and beverages in the [place]. It includes the serving size of that food or beverage and information about the nutrients in each serving of the food, including the grams (g) of carbohydrate per serving.  Decide the number of servings of this food or beverage that you will be able to eat or drink. Multiply that number of servings by the number of grams of carbohydrate that is listed on the label for that serving. The total will be the amount of carbohydrates you will be having when you eat or drink this food or beverage. Learning Standard Serving Sizes of Food When you eat food that is not packaged or does not include "Nutrition Facts" on the label, you need to measure the servings in order to count the amount of carbohydrates.A serving of most carbohydrate-rich foods contains about 15 g of carbohydrates. The following list includes serving sizes of carbohydrate-rich foods that provide 15 g ofcarbohydrate per serving:   1 slice of bread (1 oz) or 1 six-inch tortilla.    of a hamburger bun or English muffin.  4-6 crackers.   cup unsweetened dry cereal.    cup hot cereal.   cup rice or pasta.    cup mashed potatoes or  of a large baked potato.  1 cup fresh fruit or one small piece of fruit.    cup canned or frozen fruit or fruit juice.  1 cup milk.   cup plain fat-free yogurt or yogurt sweetened with artificial sweeteners.   cup cooked dried beans or starchy vegetable, such as peas, corn, or potatoes.  Decide the number of standard-size servings that you will eat. Multiply that number of servings by 15 (the grams of carbohydrates in that serving). For example, if you eat 2 cups of strawberries, you will have eaten 2 servings and 30 g of  carbohydrates (2 servings x 15 g = 30 g). For foods such as soups and casseroles, in which more than one food is mixed in, you will need to count the carbohydrates in each food that is included. EXAMPLE OF CARBOHYDRATE COUNTING Sample Dinner  3 oz chicken breast.   cup of brown rice.   cup of corn.  1 cup milk.   1 cup strawberries with sugar-free whipped topping.  Carbohydrate Calculation Step 1: Identify the foods that contain carbohydrates:   Rice.   Corn.   Milk.   Strawberries. Step 2:Calculate the number of servings eaten of each:   2 servings of rice.   1 serving of corn.   1 serving of milk.   1 serving of strawberries. Step 3: Multiply each of those number of servings by 15 g:   2 servings of rice x 15 g = 30 g.   1 serving of corn x 15 g = 15 g.   1 serving of milk x 15 g = 15 g.   1 serving of strawberries x  15 g = 15 g. Step 4: Add together all of the amounts to find the total grams of carbohydrates eaten: 30 g + 15 g + 15 g + 15 g = 75 g.   This information is not intended to replace advice given to you by your health care provider. Make sure you discuss any questions you have with your health care provider.   Document Released: 02/03/2005 Document Revised: 02/24/2014 Document Reviewed: 12/31/2012 Elsevier Interactive Patient Education [employer].

## 2015-07-13 LAB — MICROALBUMIN / CREATININE URINE RATIO

## 2015-07-14 LAB — WET PREP BY MOLECULAR PROBE
Candida species: NEGATIVE
Gardnerella vaginalis: POSITIVE — AB
Trichomonas vaginosis: NEGATIVE

## 2015-07-17 ENCOUNTER — Other Ambulatory Visit: Payer: Self-pay | Admitting: Internal Medicine

## 2015-07-17 LAB — GC/CHLAMYDIA PROBE AMP
CT Probe RNA: NOT DETECTED
GC Probe RNA: NOT DETECTED

## 2015-07-17 LAB — HERPES SIMPLEX VIRUS CULTURE: Organism ID, Bacteria: NOT DETECTED

## 2015-07-17 MED ORDER — METRONIDAZOLE 500 MG PO TABS
500.0000 mg | ORAL_TABLET | Freq: Two times a day (BID) | ORAL | Status: DC
Start: 2015-07-17 — End: 2015-10-15

## 2015-07-18 ENCOUNTER — Telehealth: Payer: Self-pay | Admitting: *Deleted

## 2015-07-18 NOTE — Telephone Encounter (Signed)
Patient verified DOB Patient 

## 2015-07-18 NOTE — Telephone Encounter (Signed)
-----   Message from Tresa Garter, MD sent at 07/17/2015  5:17 PM EDT ----- Please inform patient that her vaginal panel was negative for herpes and other sexually transmitted disease but positive for bacterial vaginosis treated with Flagyl antibiotics. Her hemoglobin A1c had gone up to 7.0% from 6.2%. Advised patient to continue metformin and to adhere with low-sugar, low carbohydrate diet, drink only water, abstain from soda and called her drinks containing sugar. Flagyl has been prescribed to the pharmacy for pickup.

## 2015-07-19 ENCOUNTER — Encounter (HOSPITAL_COMMUNITY): Payer: Self-pay

## 2015-07-19 ENCOUNTER — Ambulatory Visit
Admission: RE | Admit: 2015-07-19 | Discharge: 2015-07-19 | Disposition: A | Discharge status: Home / Self Care | Payer: Self-pay | Source: Ambulatory Visit | Attending: Obstetrics and Gynecology | Admitting: Obstetrics and Gynecology

## 2015-07-19 ENCOUNTER — Other Ambulatory Visit (HOSPITAL_COMMUNITY)
Admission: RE | Admit: 2015-07-19 | Discharge: 2015-07-19 | Disposition: A | Discharge status: Home / Self Care | Payer: Self-pay | Source: Ambulatory Visit | Attending: Obstetrics and Gynecology | Admitting: Obstetrics and Gynecology

## 2015-07-19 ENCOUNTER — Ambulatory Visit (HOSPITAL_COMMUNITY): Payer: Self-pay | Admitting: Psychiatry

## 2015-07-19 ENCOUNTER — Ambulatory Visit (HOSPITAL_COMMUNITY)
Admission: RE | Admit: 2015-07-19 | Discharge: 2015-07-19 | Disposition: A | Discharge status: Home / Self Care | Payer: Self-pay | Source: Ambulatory Visit | Attending: Obstetrics and Gynecology | Admitting: Obstetrics and Gynecology

## 2015-07-19 VITALS — BP 120/72 | Temp 98.2°F | Ht 65.0 in | Wt 264.0 lb

## 2015-07-19 DIAGNOSIS — N644 Mastodynia: Secondary | ICD-10-CM

## 2015-07-19 DIAGNOSIS — N6310 Unspecified lump in the right breast, unspecified quadrant: Secondary | ICD-10-CM

## 2015-07-19 DIAGNOSIS — Z1239 Encounter for other screening for malignant neoplasm of breast: Secondary | ICD-10-CM

## 2015-07-19 DIAGNOSIS — N6320 Unspecified lump in the left breast, unspecified quadrant: Principal | ICD-10-CM

## 2015-07-19 DIAGNOSIS — N6452 Nipple discharge: Secondary | ICD-10-CM

## 2015-07-19 NOTE — Patient Instructions (Signed)
Educational materials on self breast awareness given. Explained to Marisa [name] that she did not need a Pap smear today due to last Pap smear due to her history of a hysterectomy for benign reasons. Informed patient that she doesn't need any further Pap smears due to her history of a hysterectomy for benign reasons. Referred patient to the [facility] for diagnostic mammogram. Appointment scheduled for Thursday, July 19, 2015 at 1510. Let patient know will follow up with her within the next couple weeks with result with results to breast discharge by phone. Theotis Burrow Tolson verbalized understanding.  Aleane Wesenberg, Arvil Chaco, RN 3:38 PM

## 2015-07-19 NOTE — Progress Notes (Signed)
Complaints of bilateral breast lumps and pain x 3 months. Patient states breasts feel swollen and notices spontaneous discharge from both breasts in the mornings. Patient states the discharge is milky colored and the pain is constant. Patient rates pain at a 10 out of 10.  Pap Smear:  Pap smear not completed today. Last Pap smear was in 2014 per patient and normal. Per patient has no history of an abnormal Pap smear. Patient has a history of a hysterectomy in 2014 for fibroids. Patient no longer needs Pap smears due to her history of a hysterectomy for benign reasons. No Pap smear results are in EPIC.  Physical exam: Breasts Breasts symmetrical. No skin abnormalities bilateral breasts. No nipple retraction bilateral breasts. Unable to express nipple discharge from left breast on exam. Expressed a white milky appearing discharge from the right breast. Sample of discharge sent to cytology. No lymphadenopathy. No lumps palpated bilateral breasts. No lumps palpated in patients area of concern bilateral breasts. Patient complained of bilateral outer breast pain on exam. Referred patient to the [facility] for diagnostic mammogram. Appointment scheduled for Thursday, July 19, 2015 at 1510.     Pelvic/Bimanual No Pap smear completed today since patient has a history of a hysterectomy for benign reasons. Pap smear not indicated per BCCCP guidelines.   Smoking History: Patient has never smoked.  Patient Navigation: Patient education provided. Access to services provided for patient through [facility] program.

## 2015-07-20 ENCOUNTER — Encounter (HOSPITAL_COMMUNITY): Payer: Self-pay | Admitting: *Deleted

## 2015-07-24 ENCOUNTER — Ambulatory Visit: Payer: Medicaid Other | Attending: Internal Medicine

## 2015-07-24 DIAGNOSIS — M6281 Muscle weakness (generalized): Secondary | ICD-10-CM | POA: Diagnosis present

## 2015-07-24 DIAGNOSIS — R2689 Other abnormalities of gait and mobility: Secondary | ICD-10-CM

## 2015-07-24 NOTE — Therapy (Signed)
[facility] [address] [place], [place], [address] Phone: [phone]   Fax:  [phone]  Physical Therapy Evaluation  Patient Details  Name: Marisa [name] MRN: [id number] Date of Birth: 01-27-1976 Referring Provider: Dr. Doreene Burke  Encounter Date: 07/24/2015      PT End of Session - 07/24/15 1346    Visit Number 1   Number of Visits 9   Date for PT Re-Evaluation 08/23/15   Authorization Type Pt applying for Medicaid/Medicare   PT Start Time 1101   PT Stop Time 1139   PT Time Calculation (min) 38 min   Equipment Utilized During Treatment Gait belt   Activity Tolerance Patient limited by fatigue   Behavior During Therapy [facility] for tasks assessed/performed      Past Medical History  Diagnosis Date  . Chronic headaches   . Anemia   . Gait instability   . IBS (irritable bowel syndrome)   . History of ectopic pregnancy     2009-  S/P LEFT SALPINGECTOMY  . History of cardiac arrest     during SVD 1992  . Type 2 diabetes mellitus ([place])   . History of adenomatous polyp of colon   . Anal pain     chronic  . Mild obstructive sleep apnea     study 03-20-2014  no cpap recommended  . Lumbar stenosis L4 -- L5 with bulging disk    w/ right leg weakness/ decreased mobility  . Chronic low back pain   . Cyst of right ovary   . Asthma     exacerbation 02-28-2014 and 02-23-2014 secondary to Rhinovirus  . Anxiety   . History of panic attacks   . Weakness of right leg     FROM BACK PROBLEM PER PT  . Difficult intravenous access     PER PT NEEDS PICC LINE    Past Surgical History  Procedure Laterality Date  . Unilateral salpingectomy  2009    laparotomy left salpingectomy-- ectopic preg.  . Vaginal hysterectomy N/A 01/06/2013    Procedure: HYSTERECTOMY VAGINAL;  Surgeon: Osborne Oman, MD;  Location: [facility];  Service: Gynecology;  Laterality: N/A;  . Colonoscopy Left 04/29/2013    Procedure: COLONOSCOPY;   Surgeon: Arta Silence, MD;  Location: [facility];  Service: Endoscopy;  Laterality: Left;  . Flexible sigmoidoscopy N/A 11/09/2013    Procedure: FLEXIBLE SIGMOIDOSCOPY;  Surgeon: Arta Silence, MD;  Location: [facility];  Service: Endoscopy;  Laterality: N/A;  . Transthoracic echocardiogram  12-30-2012    mild LVH/  ef 55-60%  . Laparoscopic cholecystectomy  2005  . Evaluation under anesthesia with fistulectomy N/A 04/20/2014    Procedure: EXAM UNDER ANESTHESIA ;  Surgeon: Leighton Ruff, MD;  Location: [facility];  Service: General;  Laterality: N/A;  . Sphincterotomy N/A 04/20/2014    Procedure:  LATERAL INTERNAL SPHINCTEROTOMY;  Surgeon: Leighton Ruff, MD;  Location: [facility];  Service: General;  Laterality: N/A;  . Abdominal hysterectomy      There were no vitals filed for this visit.       Subjective Assessment - 07/24/15 1108    Subjective Pt reports she tried OPPT neuro about 1 year ago but it didn't work, however, she's been falling more often. Pt reports balance and BLE weakness/pain has been  getting worse over the last five months, with pt reporting that LE weakness began in early 2016. Pt has fallen over 12 times in the last 6 months.  Pt  reports she is able to walk with rollator on good days, a few times a weekk. She states that she's able to walk at home only and not in the community.    Patient is accompained by: Family member  Xavier-son   Pertinent History DM, cardiac arrest in 1992 during child birth, obesity, depression, anxiety, migraine, OSA, asthma, hx of panic attacks, (per MD notes: cervical stenosis and L4-L5 bulging disc)   Patient Stated Goals To get my strength back and walk longer distances.    Currently in Pain? No/denies            [facility] PT Assessment - 07/24/15 1113    Assessment   Medical Diagnosis Falls   Referring Provider Dr. Doreene Burke   Onset Date/Surgical Date 02/17/14  with worsening in weakness over last 5  months.   Hand Dominance Right   Prior Therapy OPPT neuro one year ago but stated it did not work   Precautions   Precautions Fall   Restrictions   Weight Bearing Restrictions No   Balance Screen   Has the patient fallen in the past 6 months Yes   How many times? 12  over 12 times   Has the patient had a decrease in activity level because of a fear of falling?  Yes   Is the patient reluctant to leave their home because of a fear of falling?  Yes   Home Environment   Living Environment Private residence   Living Arrangements Spouse/significant other  son   Available Help at Discharge Family   Type of [place] Access Level entry   [place] Two level;Able to live on main level with bedroom/bathroom   Alternate Level Stairs-Number of Steps 12   Alternate Level Stairs-Rails --  n/a, as pt does not use stairs   Home Equipment Walker - 4 wheels;Wheelchair - manual;Shower seat;Grab bars - toilet;Grab bars - tub/shower   Prior Function   Level of Independence Independent   Vocation On disability  pt is applying for disability   Leisure Personnel officer   Overall Cognitive Status Impaired/Different from baseline   Memory Impaired   Memory Impairment Decreased short term memory  per pt   Observation/Other Assessments   Focus on Therapeutic Outcomes (FOTO)  ABC: 3.8%, scores closer to zero indicate lower balance confidence.   Sensation   Light Touch Impaired by gross assessment   Additional Comments Pt denied N/T. Pt reported decr. light touch in R UE but not R hand, LUE WNL.  Pt reports R LE feels "more tingly" during light touch vs. LLE (WNL.    Coordination   Gross Motor Movements are Fluid and Coordinated No   Fine Motor Movements are Fluid and Coordinated No   Heel Shin Test Unable to perform R heel/shin test 2/2 weakness, L heel/shin also limited by weakness. RAMs WNL.   Posture/Postural Control   Posture/Postural Control Postural limitations   Postural  Limitations Rounded Shoulders;Forward head;Posterior pelvic tilt   Posture Comments Assessed in seated position. Pt unable to hold still in standing to formally assess.   Tone   Assessment Location Right Lower Extremity;Left Lower Extremity   ROM / Strength   AROM / PROM / Strength AROM;Strength   AROM   Overall AROM  Deficits   Overall AROM Comments Decr. R LE in all directions (Hip flexion, knee flex/ext, ankle DF, and L LE WFL.    Strength   Overall Strength Deficits   Overall  Strength Comments Pt experienced tremors during MMT, but they did not follow a particular pattern.    Strength Assessment Site Knee;Hip;Ankle   Right/Left Hip Right;Left   Right Hip Flexion 2/5   Right Hip ABduction 2/5   Right Hip ADduction 3/5   Left Hip Flexion 3+/5   Left Hip ABduction 3+/5   Left Hip ADduction 3+/5   Right/Left Knee Right;Left   Right Knee Flexion 2/5   Right Knee Extension 2/5   Left Knee Flexion 3+/5   Left Knee Extension 3+/5   Right/Left Ankle Right;Left   Right Ankle Dorsiflexion 2/5   Left Ankle Dorsiflexion 2/5   Transfers   Transfers Sit to Stand;Stand to Sit   Sit to Stand 3: Mod assist;With upper extremity assist;From chair/3-in-1   Sit to Stand Details Verbal cues for sequencing;Verbal cues for technique   Sit to Stand Details (indicate cue type and reason) In order to complete sit to stand, pt performed excessive hip/trunk ext.   Stand to Sit 4: Min guard;With upper extremity assist;To chair/3-in-1   Ambulation/Gait   Ambulation/Gait Yes   Ambulation/Gait Assistance 1: +2 Total assist   Ambulation/Gait Assistance Details Mod A and plus 2 for safety, as pt began to veer rollator to the R side into objects along wall and demonstrated extensive incr. postural sway after amb. 5' Pt required seated rest break 2/2 fatigue and decr. safety. Pt's gait pattern varied during gait assessment, with pt performing trunk ext and then trunk flexion, incr. lateral flexion, decr. hip/knee  flexion and then steppage gait.    Ambulation Distance (Feet) 10 Feet   Assistive device 4-wheeled walker   Gait Pattern Step-to pattern;Decreased stride length;Decreased dorsiflexion - left;Decreased dorsiflexion - right;Lateral hip instability;Lateral trunk lean to right;Lateral trunk lean to left   Ambulation Surface Level;Indoor   Balance   Balance Assessed Yes   Static Standing Balance   Static Standing - Balance Support No upper extremity supported   Static Standing - Level of Assistance 3: Mod assist   RLE Tone   RLE Tone Within Functional Limits  pt reported quad pain with movement   LLE Tone   LLE Tone Within Functional Limits  pt reported quad pain with movement.                           PT Education - 07/24/15 1345    Education provided Yes   Education Details PT discussed frequency/duration.   Person(s) Educated Patient;Child(ren)   Methods Explanation   Comprehension Verbalized understanding          PT Short Term Goals - 07/24/15 1354    PT SHORT TERM GOAL #1   Title same as LTGs           PT Long Term Goals - 07/24/15 1354    PT LONG TERM GOAL #1   Title Pt will be IND in HEP to improve strength and balance. Target date: 08/21/15   Status New   PT LONG TERM GOAL #2   Title Pt will amb. 150' with LRAD over even terrain and min guard to improve functional mobility. Target date: 08/21/15   Status New   PT LONG TERM GOAL #3   Title Perform BERG and write goal. Target date: 08/21/15   Status New   PT LONG TERM GOAL #4   Title Pt will perform Sit to stand txfs with UE assist x5 reps with min guard to improve functional mobilty and  independence. Target date: 08/21/15   Status New   PT LONG TERM GOAL #5   Title Assess bed mobility and write goal. Target date: 08/21/15   Status New   Additional Long Term Goals   Additional Long Term Goals Yes   PT LONG TERM GOAL #6   Title Pt will improve ABC score from 3.8% to 17.8% to improve confidence in  balance and quality of life. Target date: 08/21/15   Status New               Plan - 07/24/15 1347    Clinical Impression Statement Pt is a pleasant 40y/o female presenting to OPPT neuro with history of falls. Pt presented with decr. strength, impaired balance, gait deviations, decr. endurance, and decreased sensation. Pt's clinical examination findings are not consistent with pt reports of ability to amb. at home with rollator, as pt's MMT revealed decr. BLE strength, however, pt was able to amb. with assistance during assessment. Pt also experienced BLE tremors during voluntional movement while MMT performed but did not experience tremors during other voluntional movements. Pt reported she experiences spasms but BLE tone was WNL. Pt's gait pattern and deviations not consistent with MMT findings. PT will trial therapy for 4 weeks in order to address balance and strength impairments to improve functional mobiity. PT will not directly address pain but will monitor closely.  Pt unable to formally assess balance and pt was not able to perform static standing without 1 UE support and then experienced incr. postural sway with rollator, PT will re-attempt next session.    Rehab Potential Fair   Clinical Impairments Affecting Rehab Potential co-morbidities   PT Frequency 2x / week   PT Duration 4 weeks   PT Treatment/Interventions ADLs/Self Care Home Management;Biofeedback;DME Instruction;Gait training;Functional mobility training;Stair training;Therapeutic activities;Therapeutic exercise;Balance training;Neuromuscular re-education;Patient/family education;Orthotic Fit/Training;Manual techniques   PT Next Visit Plan Attempt BERG if tolerated and write goal, provide strengthening/balance HEP. Assess bed mobility.   Consulted and Agree with Plan of Care Patient;Family member/caregiver   Family Member Consulted pt's son: Phillips Odor      Patient will benefit from skilled therapeutic intervention in order to  improve the following deficits and impairments:  Abnormal gait, Decreased mobility, Difficulty walking, Decreased strength, Decreased endurance, Decreased coordination, Decreased balance, Decreased range of motion, Obesity, Impaired flexibility  Visit Diagnosis: Other abnormalities of gait and mobility - Plan: PT plan of care cert/re-cert  Muscle weakness (generalized) - Plan: PT plan of care cert/re-cert     Problem List Patient Active Problem List   Diagnosis Date Noted  . Carbuncle of labium 07/12/2015  . Rash, skin 07/12/2015  . Dandruff 03/26/2015  . Nipple discharge in female 03/26/2015  . Migraine variant with headache 05/09/2014  . Unable to ambulate 05/09/2014  . Severe recurrent major depressive disorder with psychotic features ([place]) 05/04/2014  . GAD (generalized anxiety disorder) 05/04/2014  . 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  . Social anxiety disorder 05/04/2014  . PTSD (post-traumatic stress disorder) 05/04/2014  . Cigarette nicotine dependence without complication [id number]  . Gait disturbance 04/11/2014  . Depression 03/27/2014  . Falls 03/27/2014  . OSA (obstructive sleep apnea) 03/06/2014  . Insomnia 03/06/2014  . Anxiety   . History of cardiac arrest   . Acute bronchitis   . Asthma exacerbation 02/20/2014  . Well controlled type 2 diabetes mellitus ([place]) 02/20/2014  . Sore throat 02/20/2014  . Chest pain 02/20/2014  . Tachycardia 02/20/2014  . Diabetes mellitus without complication ([place]) [id number]  .  Pelvic pain 07/25/2013  . Rectal bleeding 07/25/2013  . Persistent vomiting 04/27/2013  . Rectal bleed 04/26/2013  . Atypical chest pain 04/26/2013  . Abdominal pain 04/26/2013  . S/P Total vaginal hysterectomy on 01/06/13 01/06/2013  . Dyspnea 09/07/2012  . 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  . Anemia 07/30/2012  . Current smoker 07/30/2012    Aunisty Reali L 07/24/2015, 1:59 PM  [facility] [address] [place], [place], [address] Phone: [phone]   Fax:  [phone]  Name: Marisa [name] MRN: [id number] Date of Birth: 1975-06-01    Geoffry Paradise, PT,DPT 07/24/2015 1:59 PM Phone: [phone] Fax: [phone]

## 2015-07-31 ENCOUNTER — Ambulatory Visit: Payer: Medicaid Other

## 2015-07-31 DIAGNOSIS — R2689 Other abnormalities of gait and mobility: Secondary | ICD-10-CM | POA: Diagnosis not present

## 2015-07-31 DIAGNOSIS — M6281 Muscle weakness (generalized): Secondary | ICD-10-CM

## 2015-07-31 NOTE — Therapy (Signed)
[facility] [address] [place], [place], [address] Phone: [phone]   Fax:  [phone]  Physical Therapy Treatment  Patient Details  Name: Marisa [name] MRN: [id number] Date of Birth: 1975-05-14 Referring Provider: Dr. Doreene Burke  Encounter Date: 07/31/2015      PT End of Session - 07/31/15 1213    Visit Number 2   Number of Visits 9   Date for PT Re-Evaluation 08/23/15   Authorization Type Pt applying for Medicaid/Medicare   PT Start Time 0933  pt arrived late   PT Stop Time 1013   PT Time Calculation (min) 40 min   Equipment Utilized During Treatment Gait belt   Activity Tolerance Patient limited by fatigue;Patient limited by pain   Behavior During Therapy [facility] for tasks assessed/performed      Past Medical History  Diagnosis Date  . Chronic headaches   . Anemia   . Gait instability   . IBS (irritable bowel syndrome)   . History of ectopic pregnancy     2009-  S/P LEFT SALPINGECTOMY  . History of cardiac arrest     during SVD 1992  . Type 2 diabetes mellitus ([place])   . History of adenomatous polyp of colon   . Anal pain     chronic  . Mild obstructive sleep apnea     study 03-20-2014  no cpap recommended  . Lumbar stenosis L4 -- L5 with bulging disk    w/ right leg weakness/ decreased mobility  . Chronic low back pain   . Cyst of right ovary   . Asthma     exacerbation 02-28-2014 and 02-23-2014 secondary to Rhinovirus  . Anxiety   . History of panic attacks   . Weakness of right leg     FROM BACK PROBLEM PER PT  . Difficult intravenous access     PER PT NEEDS PICC LINE    Past Surgical History  Procedure Laterality Date  . Unilateral salpingectomy  2009    laparotomy left salpingectomy-- ectopic preg.  . Vaginal hysterectomy N/A 01/06/2013    Procedure: HYSTERECTOMY VAGINAL;  Surgeon: Osborne Oman, MD;  Location: [facility];  Service: Gynecology;  Laterality: N/A;  . Colonoscopy Left  04/29/2013    Procedure: COLONOSCOPY;  Surgeon: Arta Silence, MD;  Location: [facility];  Service: Endoscopy;  Laterality: Left;  . Flexible sigmoidoscopy N/A 11/09/2013    Procedure: FLEXIBLE SIGMOIDOSCOPY;  Surgeon: Arta Silence, MD;  Location: [facility];  Service: Endoscopy;  Laterality: N/A;  . Transthoracic echocardiogram  12-30-2012    mild LVH/  ef 55-60%  . Laparoscopic cholecystectomy  2005  . Evaluation under anesthesia with fistulectomy N/A 04/20/2014    Procedure: EXAM UNDER ANESTHESIA ;  Surgeon: Leighton Ruff, MD;  Location: [facility];  Service: General;  Laterality: N/A;  . Sphincterotomy N/A 04/20/2014    Procedure:  LATERAL INTERNAL SPHINCTEROTOMY;  Surgeon: Leighton Ruff, MD;  Location: [facility];  Service: General;  Laterality: N/A;  . Abdominal hysterectomy      There were no vitals filed for this visit.      Subjective Assessment - 07/31/15 0938    Subjective Pt reported she had incr. R UE N/T that began Sunday and comes and goes. Pt reported she feels HAs are also getting worse. PT educated pt to notify MD and go to ED if N/T worsens and HA is severe. Pt denied fall since last visit.    Patient is accompained  by: Family member   Pertinent History DM, cardiac arrest in 1992 during child birth, obesity, depression, anxiety, migraine, OSA, asthma, hx of panic attacks, (per MD notes: cervical stenosis and L4-L5 bulging disc)   Patient Stated Goals To get my strength back and walk longer distances.    Currently in Pain? Yes   Pain Score 8    Pain Location Leg   Pain Orientation Right;Left  R side is worse per pt   Pain Descriptors / Indicators Throbbing   Pain Type Chronic pain   Pain Onset More than a month ago   Pain Frequency Intermittent   Aggravating Factors  standing, walking, sititng with feet on floor   Pain Relieving Factors elevating legs        Therex: Pt performed strengthening HEP with cues and min guard to S  for safety. Please see notes for details. Pt required frequent rest breaks 2/2 fatigue.                   [facility] Adult PT Treatment/Exercise - 07/31/15 0940    Bed Mobility   Bed Mobility Supine to Sit;Sit to Supine   Supine to Sit 5: Supervision   Supine to Sit Details (indicate cue type and reason) Pt utilized UEs to assist BLEs off EOB.   Sit to Supine 5: Supervision   Sit to Supine - Details (indicate cue type and reason) Pt utilized UEs to assist BLEs onto mat.    Transfers   Transfers Sit to Stand;Stand to Sit;Squat Pivot Transfers   Sit to Stand 4: Min guard;With upper extremity assist;From chair/3-in-1   Sit to Stand Details Verbal cues for sequencing;Verbal cues for technique   Stand to Sit 4: Min guard;With upper extremity assist;To chair/3-in-1   Squat Pivot Transfers 4: Min guard;With upper extremity assistance   Number of Reps 10 reps  of sit<>stand    Comments Pt demonstrated improvements during STS today.   Standardized Balance Assessment   Standardized Balance Assessment Berg Balance Test  deferred as pt unable to stand s UE support.                PT Education - 07/31/15 1212    Education provided Yes   Education Details PT educated pt on strengthening HEP.   Person(s) Educated Patient;Child(ren)   Methods Explanation;Demonstration;Tactile cues;Verbal cues;Handout   Comprehension Returned demonstration;Verbalized understanding;Need further instruction          PT Short Term Goals - 07/24/15 1354    PT SHORT TERM GOAL #1   Title same as LTGs           PT Long Term Goals - 07/31/15 1216    PT LONG TERM GOAL #1   Title Pt will be IND in HEP to improve strength and balance. Target date: 08/21/15   Status On-going   PT LONG TERM GOAL #2   Title Pt will amb. 150' with LRAD over even terrain and min guard to improve functional mobility. Target date: 08/21/15   Status On-going   PT LONG TERM GOAL #3   Title Perform BERG and write goal.  Target date: 08/21/15   Status Deferred   PT LONG TERM GOAL #4   Title Pt will perform Sit to stand txfs with UE assist x5 reps with min guard to improve functional mobilty and independence. Target date: 08/21/15   Status On-going   PT LONG TERM GOAL #5   Title Assess bed mobility and write goal. Target date: 08/21/15  Baseline performed with S, no goal needed.   Status Achieved   PT LONG TERM GOAL #6   Title Pt will improve ABC score from 3.8% to 17.8% to improve confidence in balance and quality of life. Target date: 08/21/15   Status On-going               Plan - 07/31/15 1213    Clinical Impression Statement Pt demonstrated progress, as she was able to complete STS txfs with min gaurd with use of RW and B UEs. PT unable to perform BERG test as pt was unable to stand without UE assist. Pt required incr. time to complete all strengthening exercises 2/2 fatigue and pt reported pain caused B LEs tremors during voluntional movement and required rest. Pt's bed mobilty technique safe and she did not require assist.  Continue with POC.    Rehab Potential Fair   Clinical Impairments Affecting Rehab Potential co-morbidities   PT Frequency 2x / week   PT Duration 4 weeks   PT Treatment/Interventions ADLs/Self Care Home Management;Biofeedback;DME Instruction;Gait training;Functional mobility training;Stair training;Therapeutic activities;Therapeutic exercise;Balance training;Neuromuscular re-education;Patient/family education;Orthotic Fit/Training;Manual techniques   PT Next Visit Plan Continue strengthening HEP (B hip abd in seated), stretches and balance HEP   Consulted and Agree with Plan of Care Patient;Family member/caregiver   Family Member Consulted pt's son: Phillips Odor      Patient will benefit from skilled therapeutic intervention in order to improve the following deficits and impairments:  Abnormal gait, Decreased mobility, Difficulty walking, Decreased strength, Decreased endurance,  Decreased coordination, Decreased balance, Decreased range of motion, Obesity, Impaired flexibility  Visit Diagnosis: Muscle weakness (generalized)  Other abnormalities of gait and mobility     Problem List Patient Active Problem List   Diagnosis Date Noted  . Carbuncle of labium 07/12/2015  . Rash, skin 07/12/2015  . Dandruff 03/26/2015  . Nipple discharge in female 03/26/2015  . Migraine variant with headache 05/09/2014  . Unable to ambulate 05/09/2014  . Severe recurrent major depressive disorder with psychotic features ([place]) 05/04/2014  . GAD (generalized anxiety disorder) 05/04/2014  . 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  . Social anxiety disorder 05/04/2014  . PTSD (post-traumatic stress disorder) 05/04/2014  . Cigarette nicotine dependence without complication [id number]  . Gait disturbance 04/11/2014  . Depression 03/27/2014  . Falls 03/27/2014  . OSA (obstructive sleep apnea) 03/06/2014  . Insomnia 03/06/2014  . Anxiety   . History of cardiac arrest   . Acute bronchitis   . Asthma exacerbation 02/20/2014  . Well controlled type 2 diabetes mellitus ([place]) 02/20/2014  . Sore throat 02/20/2014  . Chest pain 02/20/2014  . Tachycardia 02/20/2014  . Diabetes mellitus without complication ([place]) [id number]  . Pelvic pain 07/25/2013  . Rectal bleeding 07/25/2013  . Persistent vomiting 04/27/2013  . Rectal bleed 04/26/2013  . Atypical chest pain 04/26/2013  . Abdominal pain 04/26/2013  . S/P Total vaginal hysterectomy on 01/06/13 01/06/2013  . Dyspnea 09/07/2012  . 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  . Anemia 07/30/2012  . Current smoker 07/30/2012    Brentt Fread L 07/31/2015, 12:18 PM  [facility] [address] [place], [place], [address] Phone: [phone]   Fax:  [phone]  Name: CALLIANNE DENISTON MRN: [id number] Date of Birth: [birth year]    Geoffry Paradise, PT,DPT 07/31/2015 12:18  PM Phone: [phone] Fax: [phone]

## 2015-07-31 NOTE — Telephone Encounter (Signed)
Patient verified DOB Patient verified she has completed the antibiotic and denies any concerns at this time. Patient also received DSS letters in the office. No further questions at this time.

## 2015-07-31 NOTE — Patient Instructions (Signed)
Functional Quadriceps: Sit to Stand    Place walker (locked) in front of you for safety. Sit on edge of chair, feet flat on floor. Stand upright (with one hand on the chair/sofa and one on the walker), extending knees fully. Repeat __5__ times per set. Do __2__ sets per session. Do __1-2__ sessions per day.  http://orth.exer.us/735   Copyright  VHI. All rights reserved.   KNEE: Extension, Long Arc Quad (Band)    Straighten right leg and slower lower after holding  __2_ seconds. Repeat with left leg. Do not use band right now.  _10__ reps per set, __1_ sets per day, _4__ days per week.  Copyright  VHI. All rights reserved.   Bridge    Lie back, legs bent. Inhale, pressing hips up. Keeping ribs in, lengthen lower back. Exhale, rolling down along spine from top. Repeat __10__ times. Do __1__ sessions per day.  http://pm.exer.us/55   Copyright  VHI. All rights reserved.

## 2015-08-02 ENCOUNTER — Ambulatory Visit: Payer: Medicaid Other | Admitting: Physical Therapy

## 2015-08-15 ENCOUNTER — Telehealth (HOSPITAL_COMMUNITY): Payer: Self-pay | Admitting: *Deleted

## 2015-08-15 NOTE — Telephone Encounter (Signed)
Telephoned patient at home # and advised patient breast discharge results were normal. Patient voiced understanding.

## 2015-08-22 ENCOUNTER — Telehealth: Payer: Self-pay | Admitting: Internal Medicine

## 2015-08-22 ENCOUNTER — Ambulatory Visit: Payer: Medicaid Other | Attending: Internal Medicine | Admitting: Physical Therapy

## 2015-08-22 ENCOUNTER — Ambulatory Visit: Payer: Medicaid Other | Attending: Internal Medicine | Admitting: Pharmacist

## 2015-08-22 DIAGNOSIS — E119 Type 2 diabetes mellitus without complications: Secondary | ICD-10-CM | POA: Insufficient documentation

## 2015-08-22 DIAGNOSIS — R2689 Other abnormalities of gait and mobility: Secondary | ICD-10-CM | POA: Insufficient documentation

## 2015-08-22 DIAGNOSIS — R269 Unspecified abnormalities of gait and mobility: Secondary | ICD-10-CM | POA: Diagnosis present

## 2015-08-22 DIAGNOSIS — M549 Dorsalgia, unspecified: Secondary | ICD-10-CM | POA: Diagnosis present

## 2015-08-22 DIAGNOSIS — G8929 Other chronic pain: Secondary | ICD-10-CM | POA: Diagnosis present

## 2015-08-22 DIAGNOSIS — M6281 Muscle weakness (generalized): Secondary | ICD-10-CM | POA: Insufficient documentation

## 2015-08-22 NOTE — Patient Instructions (Signed)
     Copyright  VHI. All rights reserved.  Adduction: Hip - Knees Together (Sitting)    Sit with towel roll between knees. Push knees together. Hold for 3 seconds. Rest for 3 seconds. Repeat 10 times. Do 1 time a day.  Copyright  VHI. All rights reserved.  External Rotation: Hip - Knees Apart With Pelvic Floor (Sitting)    Sit, band tied just above knees. Squeeze pelvic floor while pulling knees apart. Hold for 3 seconds. Rest for 3 seconds. Repeat 10 times. Do 1 time a day. Use yellow band. Copyright  VHI. All rights reserved.

## 2015-08-22 NOTE — Telephone Encounter (Signed)
Patient dropped off Handicapped paperwork to be completed by doctor. Please follow up.

## 2015-08-22 NOTE — Therapy (Signed)
[facility] [address] [place], [place], [address] Phone: [phone]   Fax:  [phone]  Physical Therapy Treatment  Patient Details  Name: Marisa [name] MRN: [id number] Date of Birth: January 19, 1976 Referring Provider: Dr. Doreene Burke  Encounter Date: 08/22/2015      PT End of Session - 08/22/15 1259    Visit Number 3   Number of Visits 9   Date for PT Re-Evaluation 08/23/15   Authorization Type Pt applying for Medicaid/Medicare   PT Start Time 1104   PT Stop Time 1146   PT Time Calculation (min) 42 min   Equipment Utilized During Treatment Gait belt   Activity Tolerance Patient limited by fatigue;Patient limited by pain   Behavior During Therapy [facility] for tasks assessed/performed      Past Medical History  Diagnosis Date  . Chronic headaches   . Anemia   . Gait instability   . IBS (irritable bowel syndrome)   . History of ectopic pregnancy     2009-  S/P LEFT SALPINGECTOMY  . History of cardiac arrest     during SVD 1992  . Type 2 diabetes mellitus ([place])   . History of adenomatous polyp of colon   . Anal pain     chronic  . Mild obstructive sleep apnea     study 03-20-2014  no cpap recommended  . Lumbar stenosis L4 -- L5 with bulging disk    w/ right leg weakness/ decreased mobility  . Chronic low back pain   . Cyst of right ovary   . Asthma     exacerbation 02-28-2014 and 02-23-2014 secondary to Rhinovirus  . Anxiety   . History of panic attacks   . Weakness of right leg     FROM BACK PROBLEM PER PT  . Difficult intravenous access     PER PT NEEDS PICC LINE    Past Surgical History  Procedure Laterality Date  . Unilateral salpingectomy  2009    laparotomy left salpingectomy-- ectopic preg.  . Vaginal hysterectomy N/A 01/06/2013    Procedure: HYSTERECTOMY VAGINAL;  Surgeon: Osborne Oman, MD;  Location: [facility];  Service: Gynecology;  Laterality: N/A;  . Colonoscopy Left 04/29/2013     Procedure: COLONOSCOPY;  Surgeon: Arta Silence, MD;  Location: [facility];  Service: Endoscopy;  Laterality: Left;  . Flexible sigmoidoscopy N/A 11/09/2013    Procedure: FLEXIBLE SIGMOIDOSCOPY;  Surgeon: Arta Silence, MD;  Location: [facility];  Service: Endoscopy;  Laterality: N/A;  . Transthoracic echocardiogram  12-30-2012    mild LVH/  ef 55-60%  . Laparoscopic cholecystectomy  2005  . Evaluation under anesthesia with fistulectomy N/A 04/20/2014    Procedure: EXAM UNDER ANESTHESIA ;  Surgeon: Leighton Ruff, MD;  Location: [facility];  Service: General;  Laterality: N/A;  . Sphincterotomy N/A 04/20/2014    Procedure:  LATERAL INTERNAL SPHINCTEROTOMY;  Surgeon: Leighton Ruff, MD;  Location: [facility];  Service: General;  Laterality: N/A;  . Abdominal hysterectomy      There were no vitals filed for this visit.      Subjective Assessment - 08/22/15 1112    Subjective Pt reports RLE giving way and fell onto her knees.  Feels wobbly this morning.  Still having R UE N/T and HA.   Patient is accompained by: Family member  son   Pertinent History DM, cardiac arrest in 1992 during child birth, obesity, depression, anxiety, migraine, OSA, asthma, hx of panic attacks, (per MD  notes: cervical stenosis and L4-L5 bulging disc)   Patient Stated Goals To get my strength back and walk longer distances.    Currently in Pain? Yes   Pain Score 9    Pain Location Leg   Pain Orientation Right;Left  R worse than L   Pain Descriptors / Indicators Throbbing   Pain Type Chronic pain   Pain Onset More than a month ago   Pain Frequency Intermittent   Aggravating Factors  standing, walking, sitting with feet on floor   Pain Relieving Factors elevating legs   Multiple Pain Sites Yes   Pain Score 9   Pain Location Head   Pain Orientation Posterior   Pain Descriptors / Indicators Throbbing   Pain Type Acute pain   Pain Onset 1 to 4 weeks ago   Pain Frequency Constant    Aggravating Factors  increased light   Pain Relieving Factors darkness      Seated LAQ x 10 (LLE with 1# weight, no weight on RLE), seated hip flexion x 10 reps x 2 sets bil LE (LLE with 1# weight, no weight on RLE) Seated hip abduction with yellow theraband x 10 x 2 sets.  Seated hip adduction with pillow x 10 reps. Scifit level 1.0 x 4 minutes with several 20 second rest breaks.  Pt using all 4 extremities.  Pt c/o pain on R as well as fatigue therefore stopped scifit.  Explained importance of flexibility and strengthening. Provided handout for hip abduction seated and hip adduction seated. Pt needed frequent rest breaks during entire session between each set of exercises.          PT Education - 08/22/15 1259    Education provided Yes   Education Details HEP   Person(s) Educated Patient   Methods Explanation;Demonstration;Handout   Comprehension Verbalized understanding          PT Short Term Goals - 07/24/15 1354    PT SHORT TERM GOAL #1   Title same as LTGs           PT Long Term Goals - 07/31/15 1216    PT LONG TERM GOAL #1   Title Pt will be IND in HEP to improve strength and balance. Target date: 08/21/15   Status On-going   PT LONG TERM GOAL #2   Title Pt will amb. 150' with LRAD over even terrain and min guard to improve functional mobility. Target date: 08/21/15   Status On-going   PT LONG TERM GOAL #3   Title Perform BERG and write goal. Target date: 08/21/15   Status Deferred   PT LONG TERM GOAL #4   Title Pt will perform Sit to stand txfs with UE assist x5 reps with min guard to improve functional mobilty and independence. Target date: 08/21/15   Status On-going   PT LONG TERM GOAL #5   Title Assess bed mobility and write goal. Target date: 08/21/15   Baseline performed with S, no goal needed.   Status Achieved   PT LONG TERM GOAL #6   Title Pt will improve ABC score from 3.8% to 17.8% to improve confidence in balance and quality of life. Target date:  08/21/15   Status On-going               Plan - 08/22/15 1300    Clinical Impression Statement Pt arrived 30 minutes late for scheduled session but able to reschedule pt later in the morning for her appointment.  Pt continues to require rest  breaks during session and c/o fatique and bil LE pain with activity.  Continue PT per POC.   Rehab Potential Fair   Clinical Impairments Affecting Rehab Potential co-morbidities   PT Frequency 2x / week   PT Duration 4 weeks   PT Treatment/Interventions ADLs/Self Care Home Management;Biofeedback;DME Instruction;Gait training;Functional mobility training;Stair training;Therapeutic activities;Therapeutic exercise;Balance training;Neuromuscular re-education;Patient/family education;Orthotic Fit/Training;Manual techniques   PT Next Visit Plan Continue strengthening HEP, stretches and balance HEP   Consulted and Agree with Plan of Care Patient;Family member/caregiver   Family Member Consulted pt's son: Phillips Odor      Patient will benefit from skilled therapeutic intervention in order to improve the following deficits and impairments:  Abnormal gait, Decreased mobility, Difficulty walking, Decreased strength, Decreased endurance, Decreased coordination, Decreased balance, Decreased range of motion, Obesity, Impaired flexibility  Visit Diagnosis: Muscle weakness (generalized)  Other abnormalities of gait and mobility     Problem List Patient Active Problem List   Diagnosis Date Noted  . Carbuncle of labium 07/12/2015  . Rash, skin 07/12/2015  . Dandruff 03/26/2015  . Nipple discharge in female 03/26/2015  . Migraine variant with headache 05/09/2014  . Unable to ambulate 05/09/2014  . Severe recurrent major depressive disorder with psychotic features ([place]) 05/04/2014  . GAD (generalized anxiety disorder) 05/04/2014  . 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  . Social anxiety disorder 05/04/2014  . PTSD (post-traumatic stress disorder)  05/04/2014  . Cigarette nicotine dependence without complication [id number]  . Gait disturbance 04/11/2014  . Depression 03/27/2014  . Falls 03/27/2014  . OSA (obstructive sleep apnea) 03/06/2014  . Insomnia 03/06/2014  . Anxiety   . History of cardiac arrest   . Acute bronchitis   . Asthma exacerbation 02/20/2014  . Well controlled type 2 diabetes mellitus ([place]) 02/20/2014  . Sore throat 02/20/2014  . Chest pain 02/20/2014  . Tachycardia 02/20/2014  . Diabetes mellitus without complication ([place]) [id number]  . Pelvic pain 07/25/2013  . Rectal bleeding 07/25/2013  . Persistent vomiting 04/27/2013  . Rectal bleed 04/26/2013  . Atypical chest pain 04/26/2013  . Abdominal pain 04/26/2013  . S/P Total vaginal hysterectomy on 01/06/13 01/06/2013  . Dyspnea 09/07/2012  . 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  . Anemia 07/30/2012  . Current smoker 07/30/2012    Narda Bonds 08/22/2015, 1:03 PM  [facility] [address] [place], [place], [address] Phone: [phone]   Fax:  [phone]  Name: Marisa [name] MRN: [id number] Date of Birth: [birth year]    Narda Bonds, [place] 08/22/2015 1:03 PM Phone: [phone] Fax: [phone]

## 2015-08-23 LAB — GLUCOSE, POCT (MANUAL RESULT ENTRY): POC Glucose: 153 mg/dl — AB (ref 70–99)

## 2015-08-23 NOTE — Progress Notes (Signed)
Patient arrived for a blood glucose check. She denies having a meter. She is awaiting Medicaid approval. I checked with Medicaid, and she is not currently approved - once she is approved, we will need to order her the Accu-chek Aviva Plus meter and supplies which will be covered by Medicaid. Until then, patient knows that the Relion brand at [facility] is available over the counter and for a reasonable cost (~$10).

## 2015-08-24 ENCOUNTER — Ambulatory Visit: Payer: Medicaid Other | Admitting: Physical Therapy

## 2015-08-24 DIAGNOSIS — M6281 Muscle weakness (generalized): Secondary | ICD-10-CM | POA: Diagnosis not present

## 2015-08-24 DIAGNOSIS — R2689 Other abnormalities of gait and mobility: Secondary | ICD-10-CM

## 2015-08-24 DIAGNOSIS — R269 Unspecified abnormalities of gait and mobility: Secondary | ICD-10-CM

## 2015-08-24 DIAGNOSIS — G8929 Other chronic pain: Secondary | ICD-10-CM

## 2015-08-24 DIAGNOSIS — M549 Dorsalgia, unspecified: Secondary | ICD-10-CM

## 2015-08-24 NOTE — Therapy (Signed)
[facility] [address] [place], [place], [address] Phone: [phone]   Fax:  [phone]  Physical Therapy Treatment  Patient Details  Name: Marisa [name] MRN: [id number] Date of Birth: 1975-06-04 Referring Provider: Dr. Doreene Burke  Encounter Date: 08/24/2015      PT End of Session - 08/24/15 1117    Visit Number 4   Number of Visits 9   PT Start Time 0930   PT Stop Time 1015   PT Time Calculation (min) 45 min   Activity Tolerance Patient limited by fatigue;Patient tolerated treatment well;Patient limited by pain   Behavior During Therapy [facility] for tasks assessed/performed      Past Medical History  Diagnosis Date  . Chronic headaches   . Anemia   . Gait instability   . IBS (irritable bowel syndrome)   . History of ectopic pregnancy     2009-  S/P LEFT SALPINGECTOMY  . History of cardiac arrest     during SVD 1992  . Type 2 diabetes mellitus ([place])   . History of adenomatous polyp of colon   . Anal pain     chronic  . Mild obstructive sleep apnea     study 03-20-2014  no cpap recommended  . Lumbar stenosis L4 -- L5 with bulging disk    w/ right leg weakness/ decreased mobility  . Chronic low back pain   . Cyst of right ovary   . Asthma     exacerbation 02-28-2014 and 02-23-2014 secondary to Rhinovirus  . Anxiety   . History of panic attacks   . Weakness of right leg     FROM BACK PROBLEM PER PT  . Difficult intravenous access     PER PT NEEDS PICC LINE    Past Surgical History  Procedure Laterality Date  . Unilateral salpingectomy  2009    laparotomy left salpingectomy-- ectopic preg.  . Vaginal hysterectomy N/A 01/06/2013    Procedure: HYSTERECTOMY VAGINAL;  Surgeon: Osborne Oman, MD;  Location: [facility];  Service: Gynecology;  Laterality: N/A;  . Colonoscopy Left 04/29/2013    Procedure: COLONOSCOPY;  Surgeon: Arta Silence, MD;  Location: [facility];  Service: Endoscopy;  Laterality:  Left;  . Flexible sigmoidoscopy N/A 11/09/2013    Procedure: FLEXIBLE SIGMOIDOSCOPY;  Surgeon: Arta Silence, MD;  Location: [facility];  Service: Endoscopy;  Laterality: N/A;  . Transthoracic echocardiogram  12-30-2012    mild LVH/  ef 55-60%  . Laparoscopic cholecystectomy  2005  . Evaluation under anesthesia with fistulectomy N/A 04/20/2014    Procedure: EXAM UNDER ANESTHESIA ;  Surgeon: Leighton Ruff, MD;  Location: [facility];  Service: General;  Laterality: N/A;  . Sphincterotomy N/A 04/20/2014    Procedure:  LATERAL INTERNAL SPHINCTEROTOMY;  Surgeon: Leighton Ruff, MD;  Location: [facility];  Service: General;  Laterality: N/A;  . Abdominal hysterectomy      There were no vitals filed for this visit.      Subjective Assessment - 08/24/15 0939    Subjective Would love to be able to drive again; doesn't because of her leg weakness;  No fall since last visit;    Currently in Pain? Yes   Pain Score 4    Pain Location Leg   Pain Orientation Left;Right   Pain Descriptors / Indicators Throbbing   Pain Type Chronic pain   Pain Onset More than a month ago   Pain Frequency Intermittent   Pain Relieving Factors massage legs  Therapeutic exercise ;  In parallel bars; multi bouts of sit to stand; SBA; pt has to use UE to stand and sit safely;  Education on breathing technique and focus on exertion level;  Used 0-10 scale vs focus on her pain scale. Educated to rest before she was fatigued greater than a 7/10 rating--rest before fatigue;  Focus on exhalation with activity vs focus on pain  Continued with LAQ  She was able to do 5 on each leg before she hit a fatigue barrier;   Seated UE AROM w/o back support x 5 min; cont education and focus on what she is able to do                      PT Education - 08/24/15 1114    Education provided Yes   Education Details Instructed pt in how to focus on a 1-10 scale of percieved  exertion vs a pain scale as she is doing ther ex; recommended she keep exertion levels below 5-6 and rest before she gets higher levels;  also incorporated focus on breathing with ex's;    Person(s) Educated Patient   Methods Explanation   Comprehension Verbalized understanding          PT Short Term Goals - 07/24/15 1354    PT SHORT TERM GOAL #1   Title same as LTGs           PT Long Term Goals - 07/31/15 1216    PT LONG TERM GOAL #1   Title Pt will be IND in HEP to improve strength and balance. Target date: 08/21/15   Status On-going   PT LONG TERM GOAL #2   Title Pt will amb. 150' with LRAD over even terrain and min guard to improve functional mobility. Target date: 08/21/15   Status On-going   PT LONG TERM GOAL #3   Title Perform BERG and write goal. Target date: 08/21/15   Status Deferred   PT LONG TERM GOAL #4   Title Pt will perform Sit to stand txfs with UE assist x5 reps with min guard to improve functional mobilty and independence. Target date: 08/21/15   Status On-going   PT LONG TERM GOAL #5   Title Assess bed mobility and write goal. Target date: 08/21/15   Baseline performed with S, no goal needed.   Status Achieved   PT LONG TERM GOAL #6   Title Pt will improve ABC score from 3.8% to 17.8% to improve confidence in balance and quality of life. Target date: 08/21/15   Status On-going               Plan - 08/24/15 1117    Clinical Impression Statement Pt put forward a great effort during the session today; her standing tolerance is limited to approx 15 seconds; she's unsteady when standing requiring UE support and her legs tend to collapse;  on the last bout we did  her legs gave way and it strained her lower back;  she was very responsive to the tx idea of focusing on ability vs pain   PT Next Visit Plan cont with educaton for HEP and focus on ability vs pain      Patient will benefit from skilled therapeutic intervention in order to improve the following  deficits and impairments:  Decreased coordination, Decreased endurance, Difficulty walking, Decreased balance, Decreased strength, Decreased activity tolerance  Visit Diagnosis: Muscle weakness (generalized)  Other abnormalities of gait and mobility  Abnormality  of gait  Back pain, chronic     Problem List Patient Active Problem List   Diagnosis Date Noted  . Carbuncle of labium 07/12/2015  . Rash, skin 07/12/2015  . Dandruff 03/26/2015  . Nipple discharge in female 03/26/2015  . Migraine variant with headache 05/09/2014  . Unable to ambulate 05/09/2014  . Severe recurrent major depressive disorder with psychotic features ([place]) 05/04/2014  . GAD (generalized anxiety disorder) 05/04/2014  . 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  . Social anxiety disorder 05/04/2014  . PTSD (post-traumatic stress disorder) 05/04/2014  . Cigarette nicotine dependence without complication [id number]  . Gait disturbance 04/11/2014  . Depression 03/27/2014  . Falls 03/27/2014  . OSA (obstructive sleep apnea) 03/06/2014  . Insomnia 03/06/2014  . Anxiety   . History of cardiac arrest   . Acute bronchitis   . Asthma exacerbation 02/20/2014  . Well controlled type 2 diabetes mellitus ([place]) 02/20/2014  . Sore throat 02/20/2014  . Chest pain 02/20/2014  . Tachycardia 02/20/2014  . Diabetes mellitus without complication ([place]) [id number]  . Pelvic pain 07/25/2013  . Rectal bleeding 07/25/2013  . Persistent vomiting 04/27/2013  . Rectal bleed 04/26/2013  . Atypical chest pain 04/26/2013  . Abdominal pain 04/26/2013  . S/P Total vaginal hysterectomy on 01/06/13 01/06/2013  . Dyspnea 09/07/2012  . 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  . Anemia 07/30/2012  . Current smoker 07/30/2012    Rosaura Carpenter D  PT DPT  08/24/2015, 11:24 AM  [facility] [address] [place], [place], [address] Phone: [phone]   Fax:  [phone]  Name:  Marisa [name] MRN: [id number] Date of Birth: 1975-10-18

## 2015-08-28 ENCOUNTER — Ambulatory Visit: Payer: Medicaid Other

## 2015-08-28 DIAGNOSIS — M6281 Muscle weakness (generalized): Secondary | ICD-10-CM

## 2015-08-28 DIAGNOSIS — R2689 Other abnormalities of gait and mobility: Secondary | ICD-10-CM

## 2015-08-28 NOTE — Patient Instructions (Signed)
Functional Quadriceps: Sit to Stand    Place walker (locked) in front of you for safety. Sit on edge of chair, feet flat on floor. Stand upright (with one hand on the chair/sofa and one on the walker), extending knees fully. Repeat __5__ times per set. Do __2__ sets per session. Do __1-2__ sessions per day.  http://orth.exer.us/735   Copyright  VHI. All rights reserved.   KNEE: Extension, Long Arc Quad (Band)    Straighten right leg and slower lower after holding __2_ seconds. Repeat with left leg. Do not use band right now.  _10__ reps per set, __2_ sets per day, _4__ days per week.  Copyright  VHI. All rights reserved.   Bridge    Lie back, legs bent. Inhale, pressing hips up. Keeping ribs in, lengthen lower back. Exhale, rolling down along spine from top. Repeat __10__ times. Do __1__ sessions per day.  http://pm.exer.us/55   Copyright  VHI. All rights reserved.  Adduction: Hip - Knees Together (Sitting)    Sit with towel roll between knees. Push knees together. Hold for 3 seconds. Rest for 3 seconds. Repeat 10 times. Do 4 times a week.  Copyright  VHI. All rights reserved.  External Rotation: Hip - Knees Apart With Pelvic Floor (Sitting)    Sit, band tied just above knees. Hold for 3 seconds. Rest for 3 seconds. Repeat 10 times. Do 4 times a week. Use yellow band. Copyright  VHI. All rights reserved.

## 2015-08-28 NOTE — Therapy (Signed)
[facility] [address] [place], [place], [address] Phone: [phone]   Fax:  [phone]  Physical Therapy Treatment  Patient Details  Name: Marisa [name] MRN: [id number] Date of Birth: January 07, 1976 Referring Provider: Dr. Doreene Burke  Encounter Date: 08/28/2015      PT End of Session - 08/28/15 1020    Visit Number 5   Number of Visits 9   Date for PT Re-Evaluation 09/22/15  PT requesting add'l 2x/week for 4 weeks   Authorization Type Pt applying for Medicaid/Medicare- approved for Medicaid per pt, waiting on auth   PT Start Time 0930   PT Stop Time 1012   PT Time Calculation (min) 42 min   Equipment Utilized During Treatment Gait belt   Activity Tolerance Patient limited by fatigue;Patient limited by pain   Behavior During Therapy [facility] for tasks assessed/performed      Past Medical History  Diagnosis Date  . Chronic headaches   . Anemia   . Gait instability   . IBS (irritable bowel syndrome)   . History of ectopic pregnancy     2009-  S/P LEFT SALPINGECTOMY  . History of cardiac arrest     during SVD 1992  . Type 2 diabetes mellitus ([place])   . History of adenomatous polyp of colon   . Anal pain     chronic  . Mild obstructive sleep apnea     study 03-20-2014  no cpap recommended  . Lumbar stenosis L4 -- L5 with bulging disk    w/ right leg weakness/ decreased mobility  . Chronic low back pain   . Cyst of right ovary   . Asthma     exacerbation 02-28-2014 and 02-23-2014 secondary to Rhinovirus  . Anxiety   . History of panic attacks   . Weakness of right leg     FROM BACK PROBLEM PER PT  . Difficult intravenous access     PER PT NEEDS PICC LINE    Past Surgical History  Procedure Laterality Date  . Unilateral salpingectomy  2009    laparotomy left salpingectomy-- ectopic preg.  . Vaginal hysterectomy N/A 01/06/2013    Procedure: HYSTERECTOMY VAGINAL;  Surgeon: Osborne Oman, MD;  Location:  [facility];  Service: Gynecology;  Laterality: N/A;  . Colonoscopy Left 04/29/2013    Procedure: COLONOSCOPY;  Surgeon: Arta Silence, MD;  Location: [facility];  Service: Endoscopy;  Laterality: Left;  . Flexible sigmoidoscopy N/A 11/09/2013    Procedure: FLEXIBLE SIGMOIDOSCOPY;  Surgeon: Arta Silence, MD;  Location: [facility];  Service: Endoscopy;  Laterality: N/A;  . Transthoracic echocardiogram  12-30-2012    mild LVH/  ef 55-60%  . Laparoscopic cholecystectomy  2005  . Evaluation under anesthesia with fistulectomy N/A 04/20/2014    Procedure: EXAM UNDER ANESTHESIA ;  Surgeon: Leighton Ruff, MD;  Location: [facility];  Service: General;  Laterality: N/A;  . Sphincterotomy N/A 04/20/2014    Procedure:  LATERAL INTERNAL SPHINCTEROTOMY;  Surgeon: Leighton Ruff, MD;  Location: [facility];  Service: General;  Laterality: N/A;  . Abdominal hysterectomy      There were no vitals filed for this visit.      Subjective Assessment - 08/28/15 0934    Subjective Pt reported she now has Medicaid and is waiting for the card. Pt denied falls or changes since last visit.    Pertinent History DM, cardiac arrest in 1992 during child birth, obesity, depression, anxiety, migraine, OSA, asthma, hx of  panic attacks, (per MD notes: cervical stenosis and L4-L5 bulging disc)   Patient Stated Goals To get my strength back and walk longer distances.    Currently in Pain? Yes   Pain Score 8    Pain Location Knee   Pain Orientation Right;Left   Pain Descriptors / Indicators Throbbing   Pain Type Acute pain   Pain Onset Yesterday   Pain Frequency Intermittent   Aggravating Factors  moving   Pain Relieving Factors massage legs and rest          Therex: Pt performed and progressed strengthening HEP as tolerated with cues for technique. Please see pt instructions for details.                [facility] Adult PT Treatment/Exercise - 08/28/15 0944    Transfers    Transfers Sit to Stand;Stand to Sit;Squat Pivot Transfers   Sit to Stand 4: Min guard;With upper extremity assist;From chair/3-in-1   Sit to Stand Details (indicate cue type and reason) Pt demonstrated proper technique, min guard as pt experienced BLE tremors upon standing during last 3 reps. x5 reps   Stand to Sit 4: Min guard;With upper extremity assist;To chair/3-in-1  improved controlled descent. x5 reps.   Ambulation/Gait   Ambulation/Gait Yes   Ambulation/Gait Assistance 4: Min assist;1: +2 Total assist  +2 or w/c follow   Ambulation/Gait Assistance Details Amb. in // bars with cues to improve lateral wt. shifting and heel strike, pt noted to amb. with wide BOS.   Ambulation Distance (Feet) --  2x7'   Assistive device Parallel bars   Gait Pattern Decreased stride length;Decreased dorsiflexion - left;Decreased dorsiflexion - right;Lateral hip instability;Lateral trunk lean to right;Lateral trunk lean to left;Step-through pattern   Ambulation Surface Level;Indoor                PT Education - 08/28/15 1019    Education provided Yes   Education Details PT reviewed HEP and progressed/modified as tolerated. PT discussed waiting for Medicaid approval before rescheduling appt's, as pt would be responsible for visits not covered by Medicaid, pt agreeable. PT discussed progress.   Person(s) Educated Patient   Methods Explanation;Tactile cues;Verbal cues;Demonstration;Handout   Comprehension Returned demonstration;Verbalized understanding;Need further instruction          PT Short Term Goals - 07/24/15 1354    PT SHORT TERM GOAL #1   Title same as LTGs           PT Long Term Goals - 08/28/15 1703    PT LONG TERM GOAL #1   Title Pt will be IND in HEP to improve strength and balance. Target date: 08/21/15. All unmet goals will be carried over to new POC. 09/22/15   Baseline No HEP   Status Partially Met   PT LONG TERM GOAL #2   Title Pt will amb. 150' with LRAD over even  terrain and min guard to improve functional mobility. Target date: 08/21/15   Baseline 10' with rollator and Mod A over even terrain   Status Not Met   PT LONG TERM GOAL #3   Title Perform BERG and write goal. Target date: 08/21/15   Baseline No BERG score   Status Deferred   PT LONG TERM GOAL #4   Title Pt will perform Sit to stand txfs with UE assist x5 reps with min guard to improve functional mobilty and independence. Target date: 08/21/15   Baseline one sit<>stand txf performed with mod A   Status Achieved  PT LONG TERM GOAL #5   Title Assess bed mobility and write goal. Target date: 08/21/15   Baseline performed with S, no goal needed.   Status Achieved   PT LONG TERM GOAL #6   Title Pt will improve ABC score from 3.8% to 17.8% to improve confidence in balance and quality of life. Target date: 08/21/15   Baseline 3.8%   Status On-going               Plan - 08/28/15 1021    Clinical Impression Statement Pt demonstrated progress, as she met LTG 4, partially met LTG 1 but did not meet LTG 2. LTG 6 deferred until d/c. Pt would continue to benefit from skilled PT to improve safety during functional mobility. PT requesting add'l 2x/week for 4 weeks. Pt's   Rehab Potential Fair   Clinical Impairments Affecting Rehab Potential co-morbidities   PT Frequency 2x / week   PT Duration 4 weeks   PT Treatment/Interventions ADLs/Self Care Home Management;Biofeedback;DME Instruction;Gait training;Functional mobility training;Stair training;Therapeutic activities;Therapeutic exercise;Balance training;Neuromuscular re-education;Patient/family education;Orthotic Fit/Training;Manual techniques   PT Next Visit Plan Gait training, balance training.   PT Home Exercise Plan Strengthening HEP   Consulted and Agree with Plan of Care Patient      Patient will benefit from skilled therapeutic intervention in order to improve the following deficits and impairments:  Decreased coordination, Decreased  endurance, Difficulty walking, Decreased balance, Decreased strength, Decreased activity tolerance  Visit Diagnosis: Muscle weakness (generalized) - Plan: PT plan of care cert/re-cert  Other abnormalities of gait and mobility - Plan: PT plan of care cert/re-cert     Problem List Patient Active Problem List   Diagnosis Date Noted  . Carbuncle of labium 07/12/2015  . Rash, skin 07/12/2015  . Dandruff 03/26/2015  . Nipple discharge in female 03/26/2015  . Migraine variant with headache 05/09/2014  . Unable to ambulate 05/09/2014  . Severe recurrent major depressive disorder with psychotic features ([place]) 05/04/2014  . GAD (generalized anxiety disorder) 05/04/2014  . 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  . Social anxiety disorder 05/04/2014  . PTSD (post-traumatic stress disorder) 05/04/2014  . Cigarette nicotine dependence without complication 73/53/2992  . Gait disturbance 04/11/2014  . Depression 03/27/2014  . Falls 03/27/2014  . OSA (obstructive sleep apnea) 03/06/2014  . Insomnia 03/06/2014  . Anxiety   . History of cardiac arrest   . Acute bronchitis   . Asthma exacerbation 02/20/2014  . Well controlled type 2 diabetes mellitus ([place]) 02/20/2014  . Sore throat 02/20/2014  . Chest pain 02/20/2014  . Tachycardia 02/20/2014  . Diabetes mellitus without complication ([place]) 42/68/3419  . Pelvic pain 07/25/2013  . Rectal bleeding 07/25/2013  . Persistent vomiting 04/27/2013  . Rectal bleed 04/26/2013  . Atypical chest pain 04/26/2013  . Abdominal pain 04/26/2013  . S/P Total vaginal hysterectomy on 01/06/13 01/06/2013  . Dyspnea 09/07/2012  . 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  . Anemia 07/30/2012  . Current smoker 07/30/2012    Aleigha Gilani L 08/28/2015, 5:12 PM  [facility] [address] [place], [place], [address] Phone: [phone]   Fax:  [phone]  Name: DESARE DUDDY MRN: [id number] Date  of Birth: [birth year]    Geoffry Paradise, PT,DPT 08/28/2015 5:12 PM Phone: [phone] Fax: [phone]

## 2015-08-29 NOTE — Telephone Encounter (Signed)
Pt called medicaid is active now and would like diabetic meter sent over to pharmacy. Patient also asked status of paperwork. Please f/up

## 2015-08-30 ENCOUNTER — Ambulatory Visit: Payer: Self-pay

## 2015-09-03 ENCOUNTER — Other Ambulatory Visit: Payer: Self-pay | Admitting: *Deleted

## 2015-09-03 MED ORDER — TRUE METRIX METER W/DEVICE KIT: 1.0000 | PACK | Freq: Three times a day (TID) | Status: DC

## 2015-09-03 MED ORDER — TRUEPLUS LANCETS 28G MISC: 1.0000 | Freq: Three times a day (TID) | Status: DC

## 2015-09-03 MED ORDER — GLUCOSE BLOOD VI STRP
ORAL_STRIP | Status: DC
Start: 2015-09-03 — End: 2015-09-10

## 2015-09-03 NOTE — Telephone Encounter (Signed)
Patient verified DOB Patients meter supplies have been ordered. Patients handicap place card as been completed and placed at the front desk for pickup. No further questions at this time.

## 2015-09-04 ENCOUNTER — Ambulatory Visit: Payer: Self-pay

## 2015-09-06 ENCOUNTER — Ambulatory Visit: Payer: Self-pay

## 2015-09-10 ENCOUNTER — Emergency Department (HOSPITAL_COMMUNITY): Payer: Medicaid Other

## 2015-09-10 ENCOUNTER — Encounter (HOSPITAL_COMMUNITY): Payer: Self-pay | Admitting: Emergency Medicine

## 2015-09-10 ENCOUNTER — Telehealth: Payer: Self-pay | Admitting: Internal Medicine

## 2015-09-10 ENCOUNTER — Emergency Department (HOSPITAL_COMMUNITY)
Admission: EM | Admit: 2015-09-10 | Discharge: 2015-09-11 | Disposition: A | Discharge status: Home / Self Care | Payer: Medicaid Other | Attending: Emergency Medicine | Admitting: Emergency Medicine

## 2015-09-10 DIAGNOSIS — Z87891 Personal history of nicotine dependence: Secondary | ICD-10-CM | POA: Diagnosis not present

## 2015-09-10 DIAGNOSIS — R079 Chest pain, unspecified: Secondary | ICD-10-CM | POA: Diagnosis not present

## 2015-09-10 DIAGNOSIS — Z7984 Long term (current) use of oral hypoglycemic drugs: Secondary | ICD-10-CM | POA: Insufficient documentation

## 2015-09-10 DIAGNOSIS — R200 Anesthesia of skin: Secondary | ICD-10-CM | POA: Insufficient documentation

## 2015-09-10 DIAGNOSIS — Z79899 Other long term (current) drug therapy: Secondary | ICD-10-CM | POA: Diagnosis not present

## 2015-09-10 DIAGNOSIS — R519 Headache, unspecified: Secondary | ICD-10-CM

## 2015-09-10 DIAGNOSIS — J45909 Unspecified asthma, uncomplicated: Secondary | ICD-10-CM | POA: Diagnosis not present

## 2015-09-10 DIAGNOSIS — F410 Panic disorder [episodic paroxysmal anxiety] without agoraphobia: Secondary | ICD-10-CM | POA: Diagnosis not present

## 2015-09-10 DIAGNOSIS — R202 Paresthesia of skin: Secondary | ICD-10-CM | POA: Diagnosis not present

## 2015-09-10 DIAGNOSIS — R51 Headache: Secondary | ICD-10-CM

## 2015-09-10 DIAGNOSIS — H53149 Visual discomfort, unspecified: Secondary | ICD-10-CM | POA: Diagnosis not present

## 2015-09-10 DIAGNOSIS — G43909 Migraine, unspecified, not intractable, without status migrainosus: Secondary | ICD-10-CM | POA: Diagnosis not present

## 2015-09-10 LAB — CBC
HCT: 39.6 % (ref 36.0–46.0)
Hemoglobin: 13.1 g/dL (ref 12.0–15.0)
MCH: 31.1 pg (ref 26.0–34.0)
MCHC: 33.1 g/dL (ref 30.0–36.0)
MCV: 94.1 fL (ref 78.0–100.0)
Platelets: 452 10*3/uL — ABNORMAL HIGH (ref 150–400)
RBC: 4.21 MIL/uL (ref 3.87–5.11)
RDW: 12.8 % (ref 11.5–15.5)
WBC: 7.9 10*3/uL (ref 4.0–10.5)

## 2015-09-10 LAB — I-STAT TROPONIN, ED: Troponin i, poc: 0 ng/mL (ref 0.00–0.08)

## 2015-09-10 LAB — BASIC METABOLIC PANEL
Anion gap: 6 (ref 5–15)
BUN: 9 mg/dL (ref 6–20)
CO2: 23 mmol/L (ref 22–32)
Calcium: 9 mg/dL (ref 8.9–10.3)
Chloride: 108 mmol/L (ref 101–111)
Creatinine, Ser: 0.65 mg/dL (ref 0.44–1.00)
GFR calc Af Amer: >60 mL/min (ref >60)
GFR calc non Af Amer: >60 mL/min (ref >60)
Glucose, Bld: 172 mg/dL — ABNORMAL HIGH (ref 65–99)
Potassium: 4 mmol/L (ref 3.5–5.1)
Sodium: 137 mmol/L (ref 135–145)

## 2015-09-10 MED ORDER — ACCU-CHEK SOFT TOUCH LANCETS MISC: 12 refills | Status: DC

## 2015-09-10 MED ORDER — METOCLOPRAMIDE HCL 5 MG/ML IJ SOLN
10.0000 mg | Freq: Once | INTRAMUSCULAR | Status: AC
  Administered 2015-09-10: 10 mg via INTRAVENOUS
  Filled 2015-09-10: qty 2

## 2015-09-10 MED ORDER — SODIUM CHLORIDE 0.9 % IV BOLUS (SEPSIS)
1000.0000 mL | Freq: Once | INTRAVENOUS | Status: AC
  Administered 2015-09-10: 1000 mL via INTRAVENOUS

## 2015-09-10 MED ORDER — ACCU-CHEK SOFTCLIX LANCET DEV MISC: 0 refills | Status: DC

## 2015-09-10 MED ORDER — KETOROLAC TROMETHAMINE 15 MG/ML IJ SOLN
15.0000 mg | Freq: Once | INTRAMUSCULAR | Status: AC
  Administered 2015-09-10: 15 mg via INTRAVENOUS
  Filled 2015-09-10: qty 1

## 2015-09-10 MED ORDER — DIPHENHYDRAMINE HCL 50 MG/ML IJ SOLN
25.0000 mg | Freq: Once | INTRAMUSCULAR | Status: AC
  Administered 2015-09-10: 25 mg via INTRAVENOUS
  Filled 2015-09-10: qty 1

## 2015-09-10 MED ORDER — LORAZEPAM 2 MG/ML IJ SOLN
1.0000 mg | Freq: Once | INTRAMUSCULAR | Status: AC
  Administered 2015-09-10: 1 mg via INTRAVENOUS
  Filled 2015-09-10: qty 1

## 2015-09-10 MED ORDER — GLUCOSE BLOOD VI STRP: ORAL_STRIP | 12 refills | Status: DC

## 2015-09-10 MED ORDER — ACCU-CHEK AVIVA PLUS W/DEVICE KIT: PACK | 0 refills | Status: DC

## 2015-09-10 MED ORDER — ONDANSETRON HCL 4 MG/2ML IJ SOLN
4.0000 mg | Freq: Once | INTRAMUSCULAR | Status: AC
  Administered 2015-09-10: 4 mg via INTRAVENOUS
  Filled 2015-09-10: qty 2

## 2015-09-10 NOTE — Telephone Encounter (Signed)
Patient is needing a glucometer that is covered by medicaid. Patient is also needing to be contacted by the nurse patient stated that she has been sick with numbness in finger tips. Please follow up

## 2015-09-10 NOTE — ED Notes (Signed)
2 IV start attempts by RN 

## 2015-09-10 NOTE — ED Triage Notes (Signed)
Patient c/o headache, numbness in finger tips and toes and weakness x couple days since she got back from [place].

## 2015-09-10 NOTE — Telephone Encounter (Signed)
Accu-chek is preferred by [facility] Medicaid - prescription for supplies sent to [facility]. Will defer triage call to clinical team.

## 2015-09-10 NOTE — ED Notes (Signed)
2 IV start attempts by RN, Charge Rn notified of need for ultrasound placement.

## 2015-09-10 NOTE — ED Notes (Signed)
Patient transported to X-ray 

## 2015-09-10 NOTE — Discharge Instructions (Signed)
The CT scan and MRI of your head were negative for any acute abnormalities. Follow-up with your primary care provider within 2 days regarding your visit to the emergency department and the numbness in your fingertips and toes.   Return to the emergency department if you experience chest pain with exertion with associated dizziness, sweating, nausea or vomiting, or if you experience any other concerning symptoms like shortness of breath.

## 2015-09-10 NOTE — ED Notes (Signed)
Called twice for reassess vitals no response.

## 2015-09-10 NOTE — ED Provider Notes (Signed)
[facility] DEPT Provider Note   CSN: [id number] Arrival date & time: 09/10/15  1215  First Provider Contact:  First MD Initiated Contact with Patient 09/10/15 1616        History   Chief Complaint Chief Complaint  Patient presents with  . Numbness  . Weakness  . Migraine  . Chest Pain    HPI Marisa [name] is a 40 y.o. female.  HPI   Patient is a 40 year old female with history of DM type II, asthma, chronic headaches who presents the ED with headache that began this evening. Headache is progressively got worse, similar as previous headaches, nonradiating, sharp, 10/10, she's taken Excedrin Migraine with little relief. She denies trauma to her head. She is also complaining of numbness/tingling in her fingertips and toes that began shortly after her headache. Associated double vision and intermittent sharp nonradiating chest pains that last roughly 30 seconds. Patient is not on anticoagulants. Patient denies abdominal pain, nausea, vomiting, dysuria, abnormal vaginal discharge, diarrhea, shortness of breath, syncope, dizziness.  Past Medical History:  Diagnosis Date  . Anal pain    chronic  . Anemia   . Anxiety   . Asthma    exacerbation 02-28-2014 and 02-23-2014 secondary to Rhinovirus  . Chronic headaches   . Chronic low back pain   . Cyst of right ovary   . Difficult intravenous access    PER PT NEEDS PICC LINE  . Gait instability   . History of adenomatous polyp of colon   . History of cardiac arrest    during SVD 1992  . History of ectopic pregnancy    2009-  S/P LEFT SALPINGECTOMY  . History of panic attacks   . IBS (irritable bowel syndrome)   . Lumbar stenosis L4 -- L5 with bulging disk   w/ right leg weakness/ decreased mobility  . Mild obstructive sleep apnea    study 03-20-2014  no cpap recommended  . Type 2 diabetes mellitus ([place])   . Weakness of right leg    FROM BACK PROBLEM PER PT    Patient Active Problem List   Diagnosis Date Noted  .  Carbuncle of labium 07/12/2015  . Rash, skin 07/12/2015  . Dandruff 03/26/2015  . Nipple discharge in female 03/26/2015  . Migraine variant with headache 05/09/2014  . Unable to ambulate 05/09/2014  . Severe recurrent major depressive disorder with psychotic features ([place]) 05/04/2014  . GAD (generalized anxiety disorder) 05/04/2014  . 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  . Social anxiety disorder 05/04/2014  . PTSD (post-traumatic stress disorder) 05/04/2014  . Cigarette nicotine dependence without complication [id number]  . Gait disturbance 04/11/2014  . Depression 03/27/2014  . Falls 03/27/2014  . OSA (obstructive sleep apnea) 03/06/2014  . Insomnia 03/06/2014  . Anxiety   . History of cardiac arrest   . Acute bronchitis   . Asthma exacerbation 02/20/2014  . Well controlled type 2 diabetes mellitus ([place]) 02/20/2014  . Sore throat 02/20/2014  . Chest pain 02/20/2014  . Tachycardia 02/20/2014  . Diabetes mellitus without complication ([place]) [id number]  . Pelvic pain 07/25/2013  . Rectal bleeding 07/25/2013  . Persistent vomiting 04/27/2013  . Rectal bleed 04/26/2013  . Atypical chest pain 04/26/2013  . Abdominal pain 04/26/2013  . S/P Total vaginal hysterectomy on 01/06/13 01/06/2013  . Dyspnea 09/07/2012  . 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  . Anemia 07/30/2012  . Current smoker 07/30/2012    Past Surgical History:  Procedure Laterality Date  . ABDOMINAL HYSTERECTOMY    .  COLONOSCOPY Left 04/29/2013   Procedure: COLONOSCOPY;  Surgeon: Arta Silence, MD;  Location: [facility];  Service: Endoscopy;  Laterality: Left;  . EVALUATION UNDER ANESTHESIA WITH FISTULECTOMY N/A 04/20/2014   Procedure: EXAM UNDER ANESTHESIA ;  Surgeon: Leighton Ruff, MD;  Location[facility];  Service: General;  Laterality: N/A;  . FLEXIBLE SIGMOIDOSCOPY N/A 11/09/2013   Procedure: FLEXIBLE SIGMOIDOSCOPY;  Surgeon: Arta Silence, MD;  Location: [facility];  Service: Endoscopy;   Laterality: N/A;  . LAPAROSCOPIC CHOLECYSTECTOMY  2005  . SPHINCTEROTOMY N/A 04/20/2014   Procedure:  LATERAL INTERNAL SPHINCTEROTOMY;  Surgeon: Leighton Ruff, MD;  Location: [facility];  Service: General;  Laterality: N/A;  . TRANSTHORACIC ECHOCARDIOGRAM  12-30-2012   mild LVH/  ef 55-60%  . UNILATERAL SALPINGECTOMY  2009   laparotomy left salpingectomy-- ectopic preg.  Marland Kitchen VAGINAL HYSTERECTOMY N/A 01/06/2013   Procedure: HYSTERECTOMY VAGINAL;  Surgeon: Osborne Oman, MD;  Location: [facility];  Service: Gynecology;  Laterality: N/A;    OB History    Gravida Para Term Preterm AB Living   3 1 1   2 1    SAB TAB Ectopic Multiple Live Births     1 1           Home Medications    Prior to Admission medications   Medication Sig Start Date End Date Taking? Authorizing Provider  acetaminophen (TYLENOL) 500 MG tablet Take 1,000 mg by mouth every 6 (six) hours as needed for mild pain, moderate pain or headache.    Yes Historical Provider, MD  albuterol (PROVENTIL HFA;VENTOLIN HFA) 108 (90 BASE) MCG/ACT inhaler Inhale 1-2 puffs into the lungs every 6 (six) hours as needed for wheezing or shortness of breath. 02/23/14  Yes Robbie Lis, MD  aspirin-acetaminophen-caffeine (EXCEDRIN MIGRAINE) [phone] MG tablet Take 2 tablets by mouth every 6 (six) hours as needed for migraine.    Yes Historical Provider, MD  budesonide-formoterol (SYMBICORT) 160-4.5 MCG/ACT inhaler Inhale 2 puffs into the lungs 2 (two) times daily. 04/17/14  Yes Kathee Delton, MD  citalopram (CELEXA) 20 MG tablet Take 20 mg by mouth daily.   Yes Historical Provider, MD  ibuprofen (ADVIL,MOTRIN) 400 MG tablet Take 400 mg by mouth 3 (three) times daily as needed for headache, mild pain or moderate pain.   Yes Historical Provider, MD  ipratropium-albuterol (DUONEB) 0.5-2.5 (3) MG/3ML SOLN Take 3 mLs by nebulization every 6 (six) hours as needed (for wheezing/shortness of breath).   Yes Historical Provider, MD  lithium  carbonate (LITHOBID) 300 MG CR tablet Take 1 tablet (300 mg total) by mouth at bedtime. 08/29/14  Yes Leonides Grills, MD  metFORMIN (GLUCOPHAGE) 500 MG tablet Take 1 tablet (500 mg total) by mouth 2 (two) times daily with a meal. 03/26/15  Yes Olugbemiga E Doreene Burke, MD  Salicylic Acid (SELSUN BLUE DEEP CLEANSING) 3 % SHAM Apply 5 mLs topically 2 (two) times a week. 03/26/15  Yes Tresa Garter, MD  SUMAtriptan (IMITREX) 50 MG tablet Take 50 mg by mouth every 2 (two) hours as needed for migraine. May repeat in 2 hours if migraine persists or recurs.   Yes Historical Provider, MD  triamcinolone ointment (KENALOG) 0.5 % Apply 1 application topically 2 (two) times daily as needed (for itching/irritation).   Yes Historical Provider, MD  hydrocortisone (ANUSOL-HC) 25 MG suppository Place 1 suppository (25 mg total) rectally 2 (two) times daily. Patient not taking: Reported on 09/10/2015 03/26/15   Tresa Garter, MD  metroNIDAZOLE (  FLAGYL) 500 MG tablet Take 1 tablet (500 mg total) by mouth 2 (two) times daily. Patient not taking: Reported on 09/10/2015 07/17/15   Tresa Garter, MD    Family History Family History  Problem Relation Age of Onset  . Hypertension Mother   . Diabetes Mother   . Allergies Mother   . Heart disease Mother   . Cancer Father   . Hyperlipidemia Father   . Hypertension Father   . Heart disease Maternal Grandmother   . Schizophrenia Sister   . Bipolar disorder Sister   . Bipolar disorder Brother   . Bipolar disorder Sister     Social History Social History  Substance Use Topics  . Smoking status: Former Smoker    Packs/day: 0.20    Years: 11.00    Types: Cigarettes    Quit date: 11/17/2013  . Smokeless tobacco: Never Used  . Alcohol use No     Allergies   Mushroom extract complex; Penicillins; and Shellfish allergy   Review of Systems Review of Systems  Constitutional: Negative for chills, diaphoresis and fever.  HENT: Negative for trouble  swallowing and voice change.   Eyes: Positive for photophobia and visual disturbance. Negative for pain and discharge.  Respiratory: Negative for chest tightness and shortness of breath.   Cardiovascular: Positive for chest pain.  Gastrointestinal: Negative for abdominal pain, diarrhea, nausea and vomiting.  Genitourinary: Negative for dysuria and vaginal discharge.  Musculoskeletal: Negative for neck pain and neck stiffness.  Skin: Negative for rash.  Neurological: Positive for numbness and headaches. Negative for dizziness, syncope and speech difficulty.  Psychiatric/Behavioral: Negative for confusion.      Physical Exam Updated Vital Signs BP 119/84 (BP Location: Left Arm)   Pulse 82   Temp 97.8 F (36.6 C) (Oral)   Resp 20   Ht 5\' 5"  (1.651 m)   Wt 113.4 kg   LMP 11/27/2012   SpO2 98%   BMI 41.60 kg/m   Physical Exam  Constitutional: Pt is oriented to person, place, and time. Pt appears well-developed and well-nourished. No distress.  HENT:  Head: Normocephalic and atraumatic.  Mouth/Throat: Oropharynx is clear and moist.  Eyes: Conjunctivae and EOM are normal. Pupils are equal, round, and reactive to light. No scleral icterus, vision grossly normal No horizontal, vertical or rotational nystagmus  Neck: Normal range of motion. Neck supple.  Full active and passive ROM without pain No nuchal rigidity or meningeal signs  Cardiovascular: Normal rate, regular rhythm, no murmur, rub or gallop, and intact distal pulses including radial and DP 2+ bilaterally.   Pulmonary/Chest: Effort normal  No respiratory distress, lungs CTA no wheezes, rales or rhonci.  Abdominal: Soft. Normal bowel sounds, There is no tenderness. There is no rebound and no guarding.  Musculoskeletal: Normal range of motion.  Neurological: Pt. is alert and oriented to person, place, and time. No cranial nerve deficit.  Exhibits normal muscle tone. Coordination normal.  Mental Status:  Alert, oriented,  thought content appropriate. Speech fluent without evidence of aphasia. Able to follow 2 step commands without difficulty.  Cranial Nerves:  II:  Peripheral visual fields grossly normal, pupils equal, round, reactive to light III,IV, VI: ptosis not present, extra-ocular motions intact bilaterally  V,VII: smile symmetric, facial light touch sensation equal VIII: hearing grossly normal bilaterally  IX,X: midline uvula rise  XI: bilateral shoulder shrug equal and strong XII: midline tongue extension  Motor:  5/5 in upper and lower extremities bilaterally including strong and equal grip strength  and dorsiflexion/plantar flexion Sensory: light touch normal in all extremities except for fingers and toes, pt displays subjective numbness, cannot differentiate between sharp and dull in bilateral fingers and toes.  Cerebellar: normal finger-to-nose with bilateral upper extremities, pronator drift negative Gait: normal gait and balance Skin: Skin is warm and dry. No rash noted. Pt is not diaphoretic.  Psychiatric: Pt has a normal mood and affect. Behavior is normal. Judgment and thought content normal.  Nursing note and vitals reviewed.   ED Treatments / Results  Labs (all labs ordered are listed, but only abnormal results are displayed) Labs Reviewed  BASIC METABOLIC PANEL - Abnormal; Notable for the following:       Result Value   Glucose, Bld 172 (*)    All other components within normal limits  CBC - Abnormal; Notable for the following:    Platelets 452 (*)    All other components within normal limits  I-STAT TROPOININ, ED    EKG  EKG Interpretation  Date/Time:  Monday September 10 2015 12:24:49 EDT Ventricular Rate:  101 PR Interval:    QRS Duration: 85 QT Interval:  331 QTC Calculation: 429 R Axis:   -28 Text Interpretation:  Sinus tachycardia Borderline left axis deviation Low voltage, precordial leads RSR' in V1 or V2, right VCD or RVH Confirmed by Wilson Singer  MD, STEPHEN [id number]) on  09/10/2015 1:37:56 PM       Radiology No results found.   BRAIN MRI 09/10/15 CLINICAL DATA: Initial evaluation for acute headache with numbness and tingling. EXAM: MRI HEAD WITHOUT CONTRAST TECHNIQUE: Multiplanar, multiecho pulse sequences of the brain and surrounding structures were obtained without intravenous contrast. COMPARISON: Prior CT from earlier the same day. FINDINGS: Cerebral volume within normal limits for patient age. No significant cerebral white matter disease. No abnormal foci of restricted diffusion to suggest acute infarct. Gray-white matter differentiation maintained. Major intracranial vascular flow voids are preserved. No acute or chronic intracranial hemorrhage. No areas of chronic infarction. No mass lesion, midline shift, or mass effect. No hydrocephalus. No extra-axial fluid collection. Major dural sinuses are grossly patent. Craniocervical junction within normal limits. Mild degenerative spondylolysis noted at the C3-4 level with resultant mild stenosis. Remainder the visualized upper cervical spine otherwise unremarkable. Pituitary gland within normal limits. No acute abnormality about the globes and orbits. Mild scattered mucosal thickening within the ethmoidal air cells. Small retention cyst within the inferior right maxillary sinus. Paranasal sinuses are otherwise clear. Trace opacity right mastoid air cells. Left mastoid air cells are clear. Inner ear structures grossly normal. Bone marrow signal intensity within normal limits. No scalp soft tissue abnormality. IMPRESSION: Negative brain MRI. No acute intracranial process identified. Electronically Signed By: Jeannine Boga M.D. On: 09/10/2015 23:43  CT HEAD Wo Contrast 09/10/15 CLINICAL DATA: 40 year old diabetic female with headache (has chronic headaches), numbness in fingertips and toes and weakness for the past couple days. Initial encounter. EXAM: CT HEAD WITHOUT  CONTRAST TECHNIQUE: Contiguous axial images were obtained from the base of the skull through the vertex without intravenous contrast. COMPARISON: 02/07/2015 head CT. 09/13/2014 brain MR FINDINGS: No intracranial hemorrhage or CT evidence of large acute infarct. No intracranial mass lesion noted on this unenhanced exam. No age advanced atrophy or hydrocephalus. Partially empty non expanded sella. Minimal exophthalmos. Visualized mastoid air cells, middle ear cavities and paranasal sinuses are clear. IMPRESSION: No acute intracranial abnormality. Electronically Signed By: Genia Del M.D. On: 09/10/2015 18:11  DG Chest 2 View CLINICAL DATA: Headache, finger tips numbness mid chest  pain. EXAM: CHEST 2 VIEW COMPARISON: 02/07/2015 FINDINGS: The heart size and mediastinal contours are within normal limits. Both lungs are clear. The visualized skeletal structures are unremarkable. IMPRESSION: No active cardiopulmonary disease. Electronically Signed By: Fidela Salisbury M.D. On: 09/10/2015 13:28  Procedures Procedures (including critical care time)  Medications Ordered in ED Medications  sodium chloride 0.9 % bolus 1,000 mL (0 mLs Intravenous Stopped 09/10/15 2126)  ondansetron (ZOFRAN) injection 4 mg (4 mg Intravenous Given 09/10/15 1837)  ketorolac (TORADOL) 15 MG/ML injection 15 mg (15 mg Intravenous Given 09/10/15 1841)  metoCLOPramide (REGLAN) injection 10 mg (10 mg Intravenous Given 09/10/15 1841)  diphenhydrAMINE (BENADRYL) injection 25 mg (25 mg Intravenous Given 09/10/15 1841)  LORazepam (ATIVAN) injection 1 mg (1 mg Intravenous Given 09/10/15 2133)     Initial Impression / Assessment and Plan / ED Course  I have reviewed the triage vital signs and the nursing notes.  1905: Pt states her headache has improved, she states the numbness in her fingertips and toes has not resolved but has improved. Informed her that her CT results were negative.  Discussed  with the pt that she needs to finish her bolus of fluids and then I will reassess her. She was amenable to this plan  2355: Discussed results of MRI brain with the pt. Her double vision has resolved and her headache is much improved. She still has a little numbness in her fingertips and toes. She states she is feeling well and ready to go home.   Pertinent labs & imaging results that were available during my care of the patient were reviewed by me and considered in my medical decision making (see chart for details).  Clinical Course  Value Comment By Time  CT Head Wo Contrast (Reviewed) Kalman Drape, PA 07/24 1814  CT Head Wo Contrast (Reviewed) Kalman Drape, PA 07/24 1814   Pt HA treated and improved while in ED.  Presentation is like pts typical HA and non concerning for [facility], ICH, Meningitis, or temporal arteritis. Pt is afebrile with no focal neuro deficits, nuchal rigidity, or change in vision. Pt's double vision also improved while in the ED. Due to the change in vision, numbness and headache it was reasonable to get a CT head. The CT head was negative for any acute abnormalities. Since her numbness did not resolve completely in the ED Dr. Alfonse Spruce and I decided it was reasonable to obtain a brain MRI. The MRI was negative for any acute abnormalities. Her numbness is less likely due to tumor or infarct. Pt does have a hx of diabetic neuropathy so this could be 2/2 to her diabetes.   Labs unremarkable with the exception of slightly elevated platelets and elevated glucose.   Chest pain is not likely of cardiac or pulmonary etiology d/t presentation, perc negative, VSS, no tracheal deviation, no JVD or new murmur, RRR, breath sounds equal bilaterally, EKG without acute abnormalities, negative troponin, and negative CXR. Pt has been advised to return to the ED is CP becomes exertional, associated with diaphoresis or nausea, radiates to left jaw/arm, worsens or becomes concerning in any way.    Instructed pt to f/u with PCP within 2 days to be reevaluated. Discussed strict return precautions. Pt appears safe for discharge.   Pt appears reliable for follow up and is agreeable to discharge.   Case has been discussed with Dr. Alfonse Spruce who agrees with the above plan to discharge.    Final Clinical Impressions(s) / ED Diagnoses  Final diagnoses:  Numbness and tingling  Numbness  Nonintractable headache, unspecified chronicity pattern, unspecified headache type  Chest pain, unspecified chest pain type    New Prescriptions Discharge Medication List as of 09/10/2015 11:59 PM       Kalman Drape, PA 09/13/15 [place] Nguyen, MD 09/14/15 1012

## 2015-10-15 ENCOUNTER — Encounter (HOSPITAL_COMMUNITY): Payer: Self-pay | Admitting: Emergency Medicine

## 2015-10-15 ENCOUNTER — Emergency Department (HOSPITAL_COMMUNITY)
Admission: EM | Admit: 2015-10-15 | Discharge: 2015-10-15 | Disposition: A | Discharge status: Home / Self Care | Payer: Medicaid Other | Attending: Emergency Medicine | Admitting: Emergency Medicine

## 2015-10-15 DIAGNOSIS — Z7984 Long term (current) use of oral hypoglycemic drugs: Secondary | ICD-10-CM | POA: Insufficient documentation

## 2015-10-15 DIAGNOSIS — Z87891 Personal history of nicotine dependence: Secondary | ICD-10-CM | POA: Insufficient documentation

## 2015-10-15 DIAGNOSIS — R51 Headache: Secondary | ICD-10-CM | POA: Insufficient documentation

## 2015-10-15 DIAGNOSIS — Z7982 Long term (current) use of aspirin: Secondary | ICD-10-CM | POA: Insufficient documentation

## 2015-10-15 DIAGNOSIS — H1131 Conjunctival hemorrhage, right eye: Secondary | ICD-10-CM | POA: Diagnosis not present

## 2015-10-15 DIAGNOSIS — E119 Type 2 diabetes mellitus without complications: Secondary | ICD-10-CM | POA: Diagnosis not present

## 2015-10-15 DIAGNOSIS — J45901 Unspecified asthma with (acute) exacerbation: Secondary | ICD-10-CM | POA: Diagnosis not present

## 2015-10-15 DIAGNOSIS — R519 Headache, unspecified: Secondary | ICD-10-CM

## 2015-10-15 LAB — BASIC METABOLIC PANEL
Anion gap: 9 (ref 5–15)
BUN: 7 mg/dL (ref 6–20)
CO2: 19 mmol/L — ABNORMAL LOW (ref 22–32)
Calcium: 9.2 mg/dL (ref 8.9–10.3)
Chloride: 107 mmol/L (ref 101–111)
Creatinine, Ser: 0.7 mg/dL (ref 0.44–1.00)
GFR calc Af Amer: >60 mL/min (ref >60)
GFR calc non Af Amer: >60 mL/min (ref >60)
Glucose, Bld: 93 mg/dL (ref 65–99)
Potassium: 4.2 mmol/L (ref 3.5–5.1)
Sodium: 135 mmol/L (ref 135–145)

## 2015-10-15 LAB — CBC WITH DIFFERENTIAL/PLATELET
Basophils Absolute: 0 10*3/uL (ref 0.0–0.1)
Basophils Relative: 0 %
Eosinophils Absolute: 0.1 10*3/uL (ref 0.0–0.7)
Eosinophils Relative: 1 %
HCT: 42.1 % (ref 36.0–46.0)
Hemoglobin: 13.7 g/dL (ref 12.0–15.0)
Lymphocytes Relative: 40 %
Lymphs Abs: 4 10*3/uL (ref 0.7–4.0)
MCH: 31.4 pg (ref 26.0–34.0)
MCHC: 32.5 g/dL (ref 30.0–36.0)
MCV: 96.6 fL (ref 78.0–100.0)
Monocytes Absolute: 0.7 10*3/uL (ref 0.1–1.0)
Monocytes Relative: 7 %
Neutro Abs: 5.1 10*3/uL (ref 1.7–7.7)
Neutrophils Relative %: 52 %
Platelets: 373 10*3/uL (ref 150–400)
RBC: 4.36 MIL/uL (ref 3.87–5.11)
RDW: 13.2 % (ref 11.5–15.5)
WBC: 9.9 10*3/uL (ref 4.0–10.5)

## 2015-10-15 MED ORDER — OXYCODONE-ACETAMINOPHEN 5-325 MG PO TABS
2.0000 | ORAL_TABLET | Freq: Once | ORAL | Status: AC
  Administered 2015-10-15: 2 via ORAL

## 2015-10-15 MED ORDER — PROCHLORPERAZINE EDISYLATE 5 MG/ML IJ SOLN
10.0000 mg | Freq: Once | INTRAMUSCULAR | Status: AC
  Administered 2015-10-15: 10 mg via INTRAVENOUS
  Filled 2015-10-15: qty 2

## 2015-10-15 MED ORDER — OXYCODONE-ACETAMINOPHEN 5-325 MG PO TABS
ORAL_TABLET | ORAL | Status: AC
  Filled 2015-10-15: qty 2

## 2015-10-15 MED ORDER — KETOROLAC TROMETHAMINE 30 MG/ML IJ SOLN
30.0000 mg | Freq: Once | INTRAMUSCULAR | Status: AC
  Administered 2015-10-15: 30 mg via INTRAVENOUS
  Filled 2015-10-15: qty 1

## 2015-10-15 MED ORDER — DIPHENHYDRAMINE HCL 50 MG/ML IJ SOLN
25.0000 mg | Freq: Once | INTRAMUSCULAR | Status: AC
  Administered 2015-10-15: 25 mg via INTRAVENOUS
  Filled 2015-10-15: qty 1

## 2015-10-15 NOTE — ED Notes (Signed)
Pt not in room for discharge instructions

## 2015-10-15 NOTE — ED Provider Notes (Signed)
Patient signed out to me by N. Nadeau PA-C. Patient currently receiving migraine cocktail. Will recheck.  On recheck patient appears comfortable and states her symptoms are improved. Requesting crackers. Will d/c with ophthalmology follow up.    Recardo Evangelist, PA-C 10/15/15 1747    Merrily Pew, MD 10/18/15 [phone]

## 2015-10-15 NOTE — ED Notes (Signed)
Pt in waiting room c/o severe HA and blurred vision. Pt medicated per pain protocol and updated on plan of care.

## 2015-10-15 NOTE — ED Provider Notes (Signed)
[facility] DEPT Provider Note   CSN: [id number] Arrival date & time: 10/15/15  1154     History   Chief Complaint Chief Complaint  Patient presents with  . Migraine  . Eye Problem    HPI Marisa [name] is a 40 y.o. female.  Patient is a 40 year old female with past medical history of migraines and diabetes who presents the ED with complaint of headache and right eye pain, onset 2 days. Patient reports 2 days ago initially noticing a small red dot on the right side of her right conjunctiva. She notes she then had a gradual onset of sharp throbbing right-sided headache located behind her right eye that gradually progressed and now is on the right side of her head. She reports yesterday she noticed worsening redness of her right eye and states her right eye has been twitching intermittently. Endorses associated lightheadedness, blurred vision and seeing black dots in the right eye and photophobia. Denies fever, chills, eye drainage, confusion, chest pain, shortness of breath, abdominal pain, nausea, vomiting, numbness, tingling, weakness. Denies head injury or recent eye trauma. Patient reports Contac use but states she has not used her contacts over the past 4-5 days. Denies using any medications prior to arrival. Patient reports she was initially seen at urgent care this afternoon and was sent to the ED for further evaluation. Patient reports history of migraines but states her headache today is not consistent with her usual migraines.      Past Medical History:  Diagnosis Date  . Anal pain    chronic  . Anemia   . Anxiety   . Asthma    exacerbation 02-28-2014 and 02-23-2014 secondary to Rhinovirus  . Chronic headaches   . Chronic low back pain   . Cyst of right ovary   . Difficult intravenous access    PER PT NEEDS PICC LINE  . Gait instability   . History of adenomatous polyp of colon   . History of cardiac arrest    during SVD 1992  . History of ectopic pregnancy      2009-  S/P LEFT SALPINGECTOMY  . History of panic attacks   . IBS (irritable bowel syndrome)   . Lumbar stenosis L4 -- L5 with bulging disk   w/ right leg weakness/ decreased mobility  . Mild obstructive sleep apnea    study 03-20-2014  no cpap recommended  . Type 2 diabetes mellitus ([place])   . Weakness of right leg    FROM BACK PROBLEM PER PT    Patient Active Problem List   Diagnosis Date Noted  . Carbuncle of labium 07/12/2015  . Rash, skin 07/12/2015  . Dandruff 03/26/2015  . Nipple discharge in female 03/26/2015  . Migraine variant with headache 05/09/2014  . Unable to ambulate 05/09/2014  . Severe recurrent major depressive disorder with psychotic features ([place]) 05/04/2014  . GAD (generalized anxiety disorder) 05/04/2014  . 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  . Social anxiety disorder 05/04/2014  . PTSD (post-traumatic stress disorder) 05/04/2014  . Cigarette nicotine dependence without complication [id number]  . Gait disturbance 04/11/2014  . Depression 03/27/2014  . Falls 03/27/2014  . OSA (obstructive sleep apnea) 03/06/2014  . Insomnia 03/06/2014  . Anxiety   . History of cardiac arrest   . Acute bronchitis   . Asthma exacerbation 02/20/2014  . Well controlled type 2 diabetes mellitus ([place]) 02/20/2014  . Sore throat 02/20/2014  . Chest pain 02/20/2014  . Tachycardia 02/20/2014  . Diabetes mellitus without  complication ([place]) [id number]  . Pelvic pain 07/25/2013  . Rectal bleeding 07/25/2013  . Persistent vomiting 04/27/2013  . Rectal bleed 04/26/2013  . Atypical chest pain 04/26/2013  . Abdominal pain 04/26/2013  . S/P Total vaginal hysterectomy on 01/06/13 01/06/2013  . Dyspnea 09/07/2012  . 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  . Anemia 07/30/2012  . Current smoker 07/30/2012    Past Surgical History:  Procedure Laterality Date  . ABDOMINAL HYSTERECTOMY    . COLONOSCOPY Left 04/29/2013   Procedure: COLONOSCOPY;  Surgeon: Arta Silence, MD;   Location: [facility];  Service: Endoscopy;  Laterality: Left;  . EVALUATION UNDER ANESTHESIA WITH FISTULECTOMY N/A 04/20/2014   Procedure: EXAM UNDER ANESTHESIA ;  Surgeon: Leighton Ruff, MD;  Location: [facility];  Service: General;  Laterality: N/A;  . FLEXIBLE SIGMOIDOSCOPY N/A 11/09/2013   Procedure: FLEXIBLE SIGMOIDOSCOPY;  Surgeon: Arta Silence, MD;  Location: [facility];  Service: Endoscopy;  Laterality: N/A;  . LAPAROSCOPIC CHOLECYSTECTOMY  2005  . SPHINCTEROTOMY N/A 04/20/2014   Procedure:  LATERAL INTERNAL SPHINCTEROTOMY;  Surgeon: Leighton Ruff, MD;  [facility];  Service: General;  Laterality: N/A;  . TRANSTHORACIC ECHOCARDIOGRAM  12-30-2012   mild LVH/  ef 55-60%  . UNILATERAL SALPINGECTOMY  2009   laparotomy left salpingectomy-- ectopic preg.  Marland Kitchen VAGINAL HYSTERECTOMY N/A 01/06/2013   Procedure: HYSTERECTOMY VAGINAL;  Surgeon: Osborne Oman, MD;  Location: [facility];  Service: Gynecology;  Laterality: N/A;    OB History    Gravida Para Term Preterm AB Living   3 1 1   2 1    SAB TAB Ectopic Multiple Live Births     1 1           Home Medications    Prior to Admission medications   Medication Sig Start Date End Date Taking? Authorizing Provider  acetaminophen (TYLENOL) 500 MG tablet Take 1,000 mg by mouth every 6 (six) hours as needed for mild pain, moderate pain or headache.    Yes Historical Provider, MD  albuterol (PROVENTIL HFA;VENTOLIN HFA) 108 (90 BASE) MCG/ACT inhaler Inhale 1-2 puffs into the lungs every 6 (six) hours as needed for wheezing or shortness of breath. 02/23/14  Yes Robbie Lis, MD  aspirin-acetaminophen-caffeine (EXCEDRIN MIGRAINE) [phone] MG tablet Take 2 tablets by mouth every 6 (six) hours as needed for migraine.    Yes Historical Provider, MD  budesonide-formoterol (SYMBICORT) 160-4.5 MCG/ACT inhaler Inhale 2 puffs into the lungs 2 (two) times daily. 04/17/14  Yes Kathee Delton, MD  citalopram (CELEXA) 20  MG tablet Take 20 mg by mouth daily.   Yes Historical Provider, MD  hydrocortisone (ANUSOL-HC) 25 MG suppository Place 1 suppository (25 mg total) rectally 2 (two) times daily. 03/26/15  Yes Tresa Garter, MD  ibuprofen (ADVIL,MOTRIN) 400 MG tablet Take 400 mg by mouth 3 (three) times daily as needed for headache, mild pain or moderate pain.   Yes Historical Provider, MD  lithium carbonate (LITHOBID) 300 MG CR tablet Take 1 tablet (300 mg total) by mouth at bedtime. 08/29/14  Yes Leonides Grills, MD  metFORMIN (GLUCOPHAGE) 500 MG tablet Take 1 tablet (500 mg total) by mouth 2 (two) times daily with a meal. 03/26/15  Yes Tresa Garter, MD  triamcinolone ointment (KENALOG) 0.5 % Apply 1 application topically 2 (two) times daily as needed (for itching/irritation).   Yes Historical Provider, MD  ipratropium-albuterol (DUONEB) 0.5-2.5 (3) MG/3ML SOLN Take 3 mLs by nebulization every 6 (six) hours as needed (  for wheezing/shortness of breath).    Historical Provider, MD  SUMAtriptan (IMITREX) 50 MG tablet Take 50 mg by mouth every 2 (two) hours as needed for migraine. May repeat in 2 hours if migraine persists or recurs.    Historical Provider, MD    Family History Family History  Problem Relation Age of Onset  . Hypertension Mother   . Diabetes Mother   . Allergies Mother   . Heart disease Mother   . Cancer Father   . Hyperlipidemia Father   . Hypertension Father   . Heart disease Maternal Grandmother   . Schizophrenia Sister   . Bipolar disorder Sister   . Bipolar disorder Brother   . Bipolar disorder Sister     Social History Social History  Substance Use Topics  . Smoking status: Former Smoker    Packs/day: 0.20    Years: 11.00    Types: Cigarettes    Quit date: 11/17/2013  . Smokeless tobacco: Never Used  . Alcohol use No     Allergies   Mushroom extract complex; Penicillins; and Shellfish allergy   Review of Systems Review of Systems  Eyes: Positive for  photophobia, pain, redness and visual disturbance (blurred, seeing spots).  Neurological: Positive for light-headedness and headaches.  All other systems reviewed and are negative.    Physical Exam Updated Vital Signs BP 119/81   Pulse 65   Temp 98.6 F (37 C) (Oral)   Resp 18   Ht 5\' 5"  (1.651 m)   Wt 111.1 kg   LMP 11/27/2012   SpO2 98%   BMI 40.77 kg/m   Physical Exam  Constitutional: She is oriented to person, place, and time. She appears well-developed and well-nourished. No distress.  HENT:  Head: Normocephalic and atraumatic.  Mouth/Throat: Oropharynx is clear and moist. No oropharyngeal exudate.  Eyes: EOM and lids are normal. Pupils are equal, round, and reactive to light. Lids are everted and swept, no foreign bodies found. Right eye exhibits no chemosis, no discharge, no exudate and no hordeolum. No foreign body present in the right eye. Left eye exhibits no chemosis, no discharge, no exudate and no hordeolum. No foreign body present in the left eye. Right conjunctiva has a hemorrhage. No scleral icterus.    Conjunctival hemorrhage noted to right lateral conjunctiva. IOP 24 bilaterally.  Neck: Normal range of motion. Neck supple.  Cardiovascular: Normal rate, regular rhythm, normal heart sounds and intact distal pulses.   Pulmonary/Chest: Effort normal and breath sounds normal. No respiratory distress. She has no wheezes. She has no rales. She exhibits no tenderness.  Abdominal: Soft. Bowel sounds are normal. She exhibits no distension and no mass. There is no tenderness. There is no rebound and no guarding. No hernia.  Musculoskeletal: Normal range of motion. She exhibits no edema.  Neurological: She is alert and oriented to person, place, and time. She has normal strength. No cranial nerve deficit or sensory deficit. Coordination and gait normal.  Skin: Skin is warm and dry. She is not diaphoretic.  Nursing note and vitals reviewed.    ED Treatments / Results    Labs (all labs ordered are listed, but only abnormal results are displayed) Labs Reviewed  BASIC METABOLIC PANEL - Abnormal; Notable for the following:       Result Value   CO2 19 (*)    All other components within normal limits  CBC WITH DIFFERENTIAL/PLATELET    EKG  EKG Interpretation None       Radiology No  results found.  Procedures Procedures (including critical care time)  Medications Ordered in ED Medications  oxyCODONE-acetaminophen (PERCOCET/ROXICET) 5-325 MG per tablet 2 tablet (2 tablets Oral Given 10/15/15 1411)  diphenhydrAMINE (BENADRYL) injection 25 mg (25 mg Intravenous Given 10/15/15 1552)  ketorolac (TORADOL) 30 MG/ML injection 30 mg (30 mg Intravenous Given 10/15/15 1600)  prochlorperazine (COMPAZINE) injection 10 mg (10 mg Intravenous Given 10/15/15 1606)     Initial Impression / Assessment and Plan / ED Course  I have reviewed the triage vital signs and the nursing notes.  Pertinent labs & imaging results that were available during my care of the patient were reviewed by me and considered in my medical decision making (see chart for details).  Clinical Course    Pt presents with worsening right sided headache, right eye pain and redness, photophobia and seeing spots in the right eye. Denies fever. VSS. Exam revealed right subconjunctival hemorrhage, remaining eye exam unremarkable. IOP 24 and laterally. No neuro deficits. Discussed case with Dr. Laverta Baltimore who evaluated the pt. Plan to give pt migraine to tx her HA/migraine. I do not suspect SAH, ICH, CVA, intracranial lesion, meningitis, encephalopathy or encephalitis and do not think that cranial imaging is needed at this time. Patient reports having "slurred speech". However on exam patient appears to have intermittent stuttering when she becomes anxious and starts to talk faster. No slurred speech noted.   Hand-off to [employer], PA-C. Dispo pending migraine cocktail. Plan to re-evaluate pt s/p migraine  cocktail. Suspect pt can be d/c home with close outpatient ophthalmology follow-up.   Final Clinical Impressions(s) / ED Diagnoses   Final diagnoses:  None    New Prescriptions New Prescriptions   No medications on file     Nona Dell, PA-C 10/15/15 [place], MD 10/15/15 [phone]

## 2015-10-15 NOTE — ED Triage Notes (Signed)
Pt seen at urgent care for conjunctiva hemorrage and sent here for further eval due to headaches. Pt has had issues with HTN in the past, however her BP is WNL today, c/o headaches x2 days with associated redness in eyes. Sent here for further workup.

## 2015-12-23 ENCOUNTER — Emergency Department (HOSPITAL_COMMUNITY): Payer: Medicaid Other

## 2015-12-23 ENCOUNTER — Emergency Department (HOSPITAL_COMMUNITY)
Admission: EM | Admit: 2015-12-23 | Discharge: 2015-12-23 | Disposition: A | Discharge status: Home / Self Care | Payer: Medicaid Other | Attending: Emergency Medicine | Admitting: Emergency Medicine

## 2015-12-23 ENCOUNTER — Other Ambulatory Visit: Payer: Self-pay

## 2015-12-23 DIAGNOSIS — K6289 Other specified diseases of anus and rectum: Secondary | ICD-10-CM | POA: Insufficient documentation

## 2015-12-23 DIAGNOSIS — E119 Type 2 diabetes mellitus without complications: Secondary | ICD-10-CM | POA: Diagnosis not present

## 2015-12-23 DIAGNOSIS — J45909 Unspecified asthma, uncomplicated: Secondary | ICD-10-CM | POA: Insufficient documentation

## 2015-12-23 DIAGNOSIS — Z7984 Long term (current) use of oral hypoglycemic drugs: Secondary | ICD-10-CM | POA: Diagnosis not present

## 2015-12-23 DIAGNOSIS — Z87891 Personal history of nicotine dependence: Secondary | ICD-10-CM | POA: Insufficient documentation

## 2015-12-23 DIAGNOSIS — N61 Mastitis without abscess: Secondary | ICD-10-CM | POA: Diagnosis not present

## 2015-12-23 DIAGNOSIS — R55 Syncope and collapse: Secondary | ICD-10-CM | POA: Diagnosis not present

## 2015-12-23 DIAGNOSIS — Z79899 Other long term (current) drug therapy: Secondary | ICD-10-CM | POA: Insufficient documentation

## 2015-12-23 DIAGNOSIS — Z7982 Long term (current) use of aspirin: Secondary | ICD-10-CM | POA: Diagnosis not present

## 2015-12-23 DIAGNOSIS — N644 Mastodynia: Secondary | ICD-10-CM | POA: Diagnosis present

## 2015-12-23 LAB — CBC WITH DIFFERENTIAL/PLATELET
Basophils Absolute: 0 10*3/uL (ref 0.0–0.1)
Basophils Relative: 0 %
Eosinophils Absolute: 0.2 10*3/uL (ref 0.0–0.7)
Eosinophils Relative: 2 %
HCT: 41 % (ref 36.0–46.0)
Hemoglobin: 13.7 g/dL (ref 12.0–15.0)
Lymphocytes Relative: 62 %
Lymphs Abs: 5.3 10*3/uL — ABNORMAL HIGH (ref 0.7–4.0)
MCH: 31.5 pg (ref 26.0–34.0)
MCHC: 33.4 g/dL (ref 30.0–36.0)
MCV: 94.3 fL (ref 78.0–100.0)
Monocytes Absolute: 0.7 10*3/uL (ref 0.1–1.0)
Monocytes Relative: 8 %
Neutro Abs: 2.4 10*3/uL (ref 1.7–7.7)
Neutrophils Relative %: 28 %
Platelets: 390 10*3/uL (ref 150–400)
RBC: 4.35 MIL/uL (ref 3.87–5.11)
RDW: 13.2 % (ref 11.5–15.5)
WBC: 8.5 10*3/uL (ref 4.0–10.5)

## 2015-12-23 LAB — COMPREHENSIVE METABOLIC PANEL
ALT: 86 U/L — ABNORMAL HIGH (ref 14–54)
AST: 72 U/L — ABNORMAL HIGH (ref 15–41)
Albumin: 3.3 g/dL — ABNORMAL LOW (ref 3.5–5.0)
Alkaline Phosphatase: 62 U/L (ref 38–126)
Anion gap: 13 (ref 5–15)
BUN: 5 mg/dL — ABNORMAL LOW (ref 6–20)
CO2: 23 mmol/L (ref 22–32)
Calcium: 9.2 mg/dL (ref 8.9–10.3)
Chloride: 105 mmol/L (ref 101–111)
Creatinine, Ser: 0.66 mg/dL (ref 0.44–1.00)
GFR calc Af Amer: >60 mL/min (ref >60)
GFR calc non Af Amer: >60 mL/min (ref >60)
Glucose, Bld: 108 mg/dL — ABNORMAL HIGH (ref 65–99)
Potassium: 3.4 mmol/L — ABNORMAL LOW (ref 3.5–5.1)
Sodium: 141 mmol/L (ref 135–145)
Total Bilirubin: 0.5 mg/dL (ref 0.3–1.2)
Total Protein: 6.8 g/dL (ref 6.5–8.1)

## 2015-12-23 LAB — D-DIMER, QUANTITATIVE (NOT AT ARMC): D-Dimer, Quant: 0.31 ug/mL-FEU (ref 0.00–0.50)

## 2015-12-23 LAB — LIPASE, BLOOD: Lipase: 42 U/L (ref 11–51)

## 2015-12-23 MED ORDER — KETOROLAC TROMETHAMINE 30 MG/ML IJ SOLN
30.0000 mg | Freq: Once | INTRAMUSCULAR | Status: AC
  Administered 2015-12-23: 30 mg via INTRAVENOUS
  Filled 2015-12-23: qty 1

## 2015-12-23 MED ORDER — DOXYCYCLINE HYCLATE 100 MG PO CAPS: 100.0000 mg | ORAL_CAPSULE | Freq: Two times a day (BID) | ORAL | 0 refills | Status: DC

## 2015-12-23 MED ORDER — FENTANYL CITRATE (PF) 100 MCG/2ML IJ SOLN
50.0000 ug | Freq: Once | INTRAMUSCULAR | Status: AC
  Administered 2015-12-23: 50 ug via INTRAVENOUS
  Filled 2015-12-23: qty 2

## 2015-12-23 MED ORDER — OXYCODONE-ACETAMINOPHEN 5-325 MG PO TABS: 1.0000 | ORAL_TABLET | ORAL | 0 refills | Status: DC | PRN

## 2015-12-23 MED ORDER — HYDROMORPHONE HCL 2 MG/ML IJ SOLN
1.0000 mg | Freq: Once | INTRAMUSCULAR | Status: AC
  Administered 2015-12-23: 1 mg via INTRAVENOUS
  Filled 2015-12-23: qty 1

## 2015-12-23 MED ORDER — IOPAMIDOL (ISOVUE-300) INJECTION 61%
INTRAVENOUS | Status: AC
  Administered 2015-12-23: 100 mL
  Filled 2015-12-23: qty 100

## 2015-12-23 NOTE — ED Notes (Signed)
Checked [facility] 100 RN informed

## 2015-12-23 NOTE — ED Provider Notes (Signed)
[facility] DEPT Provider Note   CSN: [id number] Arrival date & time: 12/23/15  0146   By signing my name below, I, Marisa [name], attest that this documentation has been prepared under the direction and in the presence of Marisa Greek, MD. Electronically Signed: Macon [name], ED Scribe. 12/23/15. 3:03 AM.  History   Chief Complaint No chief complaint on file.  The history is provided by the patient. No language interpreter was used.    HPI Comments: Marisa [name] is a 40 y.o. female who presents to the Emergency Department complaining of moderate, constant, right sided breast pain onset two days. Pt reports her pain is gradually worsened with exertion of her right arm. She also reports having associated rectum pain. Pt notes having a bloody stool the past two days, but noticed today it turned a black color. Pt reports no modifying factors noted. 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 Pt notes there was no complications that occurred after the procedure. In addition, Pt had a syncope episode prior to admission. She describes having blurred, black, double vision prior to the episode. She notes having a HA afterwards.  She denies any further injuries. She also denies SOB, diarrhea. No additional complaints at this time.   Past Medical History:  Diagnosis Date  . Anal pain    chronic  . Anemia   . Anxiety   . Asthma    exacerbation 02-28-2014 and 02-23-2014 secondary to Rhinovirus  . Chronic headaches   . Chronic low back pain   . Cyst of right ovary   . Difficult intravenous access    PER PT NEEDS PICC LINE  . Gait instability   . History of adenomatous polyp of colon   . History of cardiac arrest    during SVD 1992  . History of ectopic pregnancy    2009-  S/P LEFT SALPINGECTOMY  . History of panic attacks   . IBS (irritable bowel syndrome)   . Lumbar stenosis L4 -- L5 with bulging disk   w/ right leg weakness/  decreased mobility  . Mild obstructive sleep apnea    study 03-20-2014  no cpap recommended  . Type 2 diabetes mellitus ([place])   . Weakness of right leg    FROM BACK PROBLEM PER PT    Patient Active Problem List   Diagnosis Date Noted  . Carbuncle of labium 07/12/2015  . Rash, skin 07/12/2015  . Dandruff 03/26/2015  . Nipple discharge in female 03/26/2015  . Migraine variant with headache 05/09/2014  . Unable to ambulate 05/09/2014  . Severe recurrent major depressive disorder with psychotic features ([place]) 05/04/2014  . GAD (generalized anxiety disorder) 05/04/2014  . 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  . Social anxiety disorder 05/04/2014  . PTSD (post-traumatic stress disorder) 05/04/2014  . Cigarette nicotine dependence without complication [id number]  . Gait disturbance 04/11/2014  . Depression 03/27/2014  . Falls 03/27/2014  . OSA (obstructive sleep apnea) 03/06/2014  . Insomnia 03/06/2014  . Anxiety   . History of cardiac arrest   . Acute bronchitis   . Asthma exacerbation 02/20/2014  . Well controlled type 2 diabetes mellitus ([place]) 02/20/2014  . Sore throat 02/20/2014  . Chest pain 02/20/2014  . Tachycardia 02/20/2014  . Diabetes mellitus without complication ([place]) [id number]  . Pelvic pain 07/25/2013  . Rectal bleeding 07/25/2013  . Persistent vomiting 04/27/2013  . Rectal bleed 04/26/2013  . Atypical chest pain 04/26/2013  . Abdominal pain 04/26/2013  .  S/P Total vaginal hysterectomy on 01/06/13 01/06/2013  . Dyspnea 09/07/2012  . 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  . Anemia 07/30/2012  . Current smoker 07/30/2012    Past Surgical History:  Procedure Laterality Date  . ABDOMINAL HYSTERECTOMY    . COLONOSCOPY Left 04/29/2013   Procedure: COLONOSCOPY;  Surgeon: Arta Silence, MD;  Location: [facility];  Service: Endoscopy;  Laterality: Left;  . EVALUATION UNDER ANESTHESIA WITH FISTULECTOMY N/A 04/20/2014   Procedure: EXAM UNDER ANESTHESIA ;  Surgeon:  Leighton Ruff, MD;  Location[facility];  Service: General;  Laterality: N/A;  . FLEXIBLE SIGMOIDOSCOPY N/A 11/09/2013   Procedure: FLEXIBLE SIGMOIDOSCOPY;  Surgeon: Arta Silence, MD;  Location: [facility];  Service: Endoscopy;  Laterality: N/A;  . LAPAROSCOPIC CHOLECYSTECTOMY  2005  . SPHINCTEROTOMY N/A 04/20/2014   Procedure:  LATERAL INTERNAL SPHINCTEROTOMY;  Surgeon: Leighton Ruff, MD;  Location: [facility];  Service: General;  Laterality: N/A;  . TRANSTHORACIC ECHOCARDIOGRAM  12-30-2012   mild LVH/  ef 55-60%  . UNILATERAL SALPINGECTOMY  2009   laparotomy left salpingectomy-- ectopic preg.  Marland Kitchen VAGINAL HYSTERECTOMY N/A 01/06/2013   Procedure: HYSTERECTOMY VAGINAL;  Surgeon: Osborne Oman, MD;  Location: [facility];  Service: Gynecology;  Laterality: N/A;    OB History    Gravida Para Term Preterm AB Living   3 1 1   2 1    SAB TAB Ectopic Multiple Live Births     1 1           Home Medications    Prior to Admission medications   Medication Sig Start Date End Date Taking? Authorizing Provider  acetaminophen (TYLENOL) 500 MG tablet Take 1,000 mg by mouth every 6 (six) hours as needed for mild pain, moderate pain or headache.     Historical Provider, MD  albuterol (PROVENTIL HFA;VENTOLIN HFA) 108 (90 BASE) MCG/ACT inhaler Inhale 1-2 puffs into the lungs every 6 (six) hours as needed for wheezing or shortness of breath. 02/23/14   Robbie Lis, MD  aspirin-acetaminophen-caffeine (EXCEDRIN MIGRAINE) [phone] MG tablet Take 2 tablets by mouth every 6 (six) hours as needed for migraine.     Historical Provider, MD  budesonide-formoterol (SYMBICORT) 160-4.5 MCG/ACT inhaler Inhale 2 puffs into the lungs 2 (two) times daily. 04/17/14   Kathee Delton, MD  citalopram (CELEXA) 20 MG tablet Take 20 mg by mouth daily.    Historical Provider, MD  doxycycline (VIBRAMYCIN) 100 MG capsule Take 1 capsule (100 mg total) by mouth 2 (two) times daily. 12/23/15    Marisa Greek, MD  hydrocortisone (ANUSOL-HC) 25 MG suppository Place 1 suppository (25 mg total) rectally 2 (two) times daily. 03/26/15   Tresa Garter, MD  ibuprofen (ADVIL,MOTRIN) 400 MG tablet Take 400 mg by mouth 3 (three) times daily as needed for headache, mild pain or moderate pain.    Historical Provider, MD  ipratropium-albuterol (DUONEB) 0.5-2.5 (3) MG/3ML SOLN Take 3 mLs by nebulization every 6 (six) hours as needed (for wheezing/shortness of breath).    Historical Provider, MD  lithium carbonate (LITHOBID) 300 MG CR tablet Take 1 tablet (300 mg total) by mouth at bedtime. 08/29/14   Leonides Grills, MD  metFORMIN (GLUCOPHAGE) 500 MG tablet Take 1 tablet (500 mg total) by mouth 2 (two) times daily with a meal. 03/26/15   Tresa Garter, MD  oxyCODONE-acetaminophen (PERCOCET) 5-325 MG tablet Take 1-2 tablets by mouth every 4 (four) hours as needed. 12/23/15   Marisa Greek, MD  SUMAtriptan (IMITREX) 50 MG tablet Take 50 mg by mouth every 2 (two) hours as needed for migraine. May repeat in 2 hours if migraine persists or recurs.    Historical Provider, MD  triamcinolone ointment (KENALOG) 0.5 % Apply 1 application topically 2 (two) times daily as needed (for itching/irritation).    Historical Provider, MD    Family History Family History  Problem Relation Age of Onset  . Hypertension Mother   . Diabetes Mother   . Allergies Mother   . Heart disease Mother   . Cancer Father   . Hyperlipidemia Father   . Hypertension Father   . Heart disease Maternal Grandmother   . Schizophrenia Sister   . Bipolar disorder Sister   . Bipolar disorder Brother   . Bipolar disorder Sister     Social History Social History  Substance Use Topics  . Smoking status: Former Smoker    Packs/day: 0.20    Years: 11.00    Types: Cigarettes    Quit date: 11/17/2013  . Smokeless tobacco: Never Used  . Alcohol use No     Allergies   Mushroom extract complex; Penicillins;  and Shellfish allergy   Review of Systems Review of Systems  Eyes: Positive for visual disturbance.  Respiratory: Negative for shortness of breath.   Gastrointestinal: Positive for blood in stool and rectal pain. Negative for diarrhea.  Musculoskeletal: Positive for myalgias (right breast).  Neurological: Positive for syncope and headaches.  All other systems reviewed and are negative.    Physical Exam Updated Vital Signs BP 93/67   Resp 16   LMP 11/27/2012   Physical Exam  Constitutional: She is oriented to person, place, and time. She appears well-developed and well-nourished. No distress.  HENT:  Head: Normocephalic and atraumatic.  Right Ear: Hearing normal.  Left Ear: Hearing normal.  Nose: Nose normal.  Mouth/Throat: Oropharynx is clear and moist and mucous membranes are normal.  Eyes: Conjunctivae and EOM are normal. Pupils are equal, round, and reactive to light.  Neck: Normal range of motion. Neck supple.  Cardiovascular: Regular rhythm, S1 normal and S2 normal.  Exam reveals no gallop and no friction rub.   No murmur heard. Pulmonary/Chest: Effort normal and breath sounds normal. No respiratory distress. She exhibits no tenderness.  Abdominal: Soft. Normal appearance and bowel sounds are normal. There is no hepatosplenomegaly. There is no tenderness. There is no rebound, no guarding, no tenderness at McBurney's point and negative Murphy's sign. No hernia.  Musculoskeletal: Normal range of motion.  Neurological: She is alert and oriented to person, place, and time. She has normal strength. No cranial nerve deficit or sensory deficit. Coordination normal. GCS eye subscore is 4. GCS verbal subscore is 5. GCS motor subscore is 6.  Skin: Skin is warm, dry and intact. No rash noted. No cyanosis.  Psychiatric: She has a normal mood and affect. Her speech is normal and behavior is normal. Thought content normal.  Nursing note and vitals reviewed.    ED Treatments / Results    DIAGNOSTIC STUDIES:     COORDINATION OF CARE: 2:56 AM Discussed treatment plan with pt at bedside which includes Chest XR and pt agreed to plan.  Labs (all labs ordered are listed, but only abnormal results are displayed) Labs Reviewed  CBC WITH DIFFERENTIAL/PLATELET - Abnormal; Notable for the following:       Result Value   Lymphs Abs 5.3 (*)    All other components within normal limits  COMPREHENSIVE METABOLIC PANEL -  Abnormal; Notable for the following:    Potassium 3.4 (*)    Glucose, Bld 108 (*)    BUN 5 (*)    Albumin 3.3 (*)    AST 72 (*)    ALT 86 (*)    All other components within normal limits  LIPASE, BLOOD  D-DIMER, QUANTITATIVE (NOT AT [facility])    EKG  EKG Interpretation None     ED ECG REPORT   Date: 12/23/2015  Rate: 95  Rhythm: normal sinus rhythm  QRS Axis: normal  Intervals: normal  ST/T Wave abnormalities: nonspecific ST changes  Conduction Disutrbances:none  Narrative Interpretation:   Old EKG Reviewed: unchanged  I have personally reviewed the EKG tracing and agree with the computerized printout as noted.   Radiology Ct Head Wo Contrast  Result Date: 12/23/2015 CLINICAL DATA:  Syncope EXAM: CT HEAD WITHOUT CONTRAST TECHNIQUE: Contiguous axial images were obtained from the base of the skull through the vertex without intravenous contrast. COMPARISON:  Brain MRI 09/10/2015 FINDINGS: Brain: No mass lesion, intraparenchymal hemorrhage or extra-axial collection. No evidence of acute cortical infarct. Brain parenchyma and CSF-containing spaces are normal for age. Vascular: No hyperdense vessel or unexpected calcification. Skull: Normal visualized skull base, calvarium and extracranial soft tissues. Sinuses/Orbits: No sinus fluid levels or advanced mucosal thickening. No mastoid effusion. Small right maxillary floor retention cyst. Normal orbits. IMPRESSION: Normal CT of the brain. Electronically Signed   By: Ulyses Jarred M.D.   On: 12/23/2015 05:31    Ct Abdomen Pelvis W Contrast  Result Date: 12/23/2015 CLINICAL DATA:  Rectal bleeding EXAM: CT ABDOMEN AND PELVIS WITH CONTRAST TECHNIQUE: Multidetector CT imaging of the abdomen and pelvis was performed using the standard protocol following bolus administration of intravenous contrast. CONTRAST:  160mL ISOVUE-300 IOPAMIDOL (ISOVUE-300) INJECTION 61% COMPARISON:  07/04/2013 FINDINGS: Lower chest: No acute abnormality. Hepatobiliary: Diffuse fatty infiltration of the liver without focal lesion. Cholecystectomy. Normal bile ducts. Pancreas: Unremarkable. No pancreatic ductal dilatation or surrounding inflammatory changes. Spleen: Normal in size without focal abnormality. Adrenals/Urinary Tract: Adrenal glands are unremarkable. Kidneys are normal, without renal calculi, focal lesion, or hydronephrosis. Bladder is unremarkable. Stomach/Bowel: Stomach is within normal limits. Appendix appears normal. No evidence of bowel wall thickening, distention, or inflammatory changes. Vascular/Lymphatic: No significant vascular findings are present. No enlarged abdominal or pelvic lymph nodes. Reproductive: Status post hysterectomy. No adnexal masses. Small benign cysts or follicles are evident on both ovaries. Other: No acute inflammatory changes are evident in the abdomen or pelvis. No ascites. Musculoskeletal: No acute or significant osseous findings. IMPRESSION: Fatty liver. No acute inflammatory changes are evident in the abdomen or pelvis. Electronically Signed   By: Andreas Newport M.D.   On: 12/23/2015 05:15   Dg Chest Portable 1 View  Result Date: 12/23/2015 CLINICAL DATA:  Confusion tonight. EXAM: PORTABLE CHEST 1 VIEW COMPARISON:  09/10/2015 FINDINGS: A single AP portable view of the chest demonstrates no focal airspace consolidation or alveolar edema. The lungs are grossly clear. There is no [name] effusion or pneumothorax. Cardiac and mediastinal contours appear unremarkable. IMPRESSION: No active disease.  Electronically Signed   By: Andreas Newport M.D.   On: 12/23/2015 03:11    Procedures Procedures (including critical care time)  Medications Ordered in ED Medications  fentaNYL (SUBLIMAZE) injection 50 mcg (50 mcg Intravenous Given 12/23/15 0359)  iopamidol (ISOVUE-300) 61 % injection (100 mLs  Contrast Given 12/23/15 0455)  HYDROmorphone (DILAUDID) injection 1 mg (1 mg Intravenous Given 12/23/15 0603)  ketorolac (TORADOL) 30 MG/ML  injection 30 mg (30 mg Intravenous Given 12/23/15 0602)     Initial Impression / Assessment and Plan / ED Course  I have reviewed the triage vital signs and the nursing notes.  Pertinent labs & imaging results that were available during my care of the patient were reviewed by me and considered in my medical decision making (see chart for details).  Clinical Course    Patient presents to the emergency department for evaluation of multiple problems. Patient reports that she has been experiencing pain in the right side of her chest. Pain is severe, stabbing in nature. She cannot move her right arm or bend or twist because of severe worsening of the pain. Examination of the breast does reveal a small ulcerated area approximately 1 cm in diameter under the breast with some mild erythema but no induration and no abscess formation.  Cardiac evaluation is negative. Symptoms are not typical for cardiac etiology. Likewise, they're atypical for PE and her d-dimer was negative.  Patient complains of abdominal and rectal pain. Reviewing her records reveals she has a history of "chronic anal pain". I suspect that this is some of her chronic complaints, as her examination was unremarkable. Rectal exam did not reveal any mass, abscess. No stool was obtained on digital rectal exam, no gross blood. Hemoglobin is normal. CT scan of abdomen and pelvis performed, no acute pathology noted.  Patient had either a near syncope or syncope here in the ER. I suspect this is related to her  pain. Neurologic examination is strange. She intermittently will speak in full sentences and answer questions but then appears confused at other times.  No focal findings on her examination. Head CT was unremarkable.  Final Clinical Impressions(s) / ED Diagnoses   Final diagnoses:  Cellulitis of breast  Syncope, unspecified syncope type  Rectal pain    New Prescriptions New Prescriptions   DOXYCYCLINE (VIBRAMYCIN) 100 MG CAPSULE    Take 1 capsule (100 mg total) by mouth 2 (two) times daily.   OXYCODONE-ACETAMINOPHEN (PERCOCET) 5-325 MG TABLET    Take 1-2 tablets by mouth every 4 (four) hours as needed.   I personally performed the services described in this documentation, which was scribed in my presence. The recorded information has been reviewed and is accurate.     Marisa Greek, MD 12/23/15 [id number]

## 2015-12-23 NOTE — ED Notes (Signed)
Pt back from CT

## 2015-12-24 LAB — POC OCCULT BLOOD, ED: Fecal Occult Bld: NEGATIVE

## 2015-12-24 LAB — I-STAT CG4 LACTIC ACID, ED: Lactic Acid, Venous: 2.46 mmol/L (ref 0.5–1.9)

## 2015-12-24 LAB — I-STAT CHEM 8, ED
BUN: 7 mg/dL (ref 6–20)
Calcium, Ion: 1.01 mmol/L — ABNORMAL LOW (ref 1.15–1.40)
Chloride: 107 mmol/L (ref 101–111)
Creatinine, Ser: 1 mg/dL (ref 0.44–1.00)
Glucose, Bld: 112 mg/dL — ABNORMAL HIGH (ref 65–99)
HCT: 46 % (ref 36.0–46.0)
Hemoglobin: 15.6 g/dL — ABNORMAL HIGH (ref 12.0–15.0)
Potassium: 4.1 mmol/L (ref 3.5–5.1)
Sodium: 142 mmol/L (ref 135–145)
TCO2: 22 mmol/L (ref 0–100)

## 2015-12-24 LAB — I-STAT TROPONIN, ED: Troponin i, poc: 0 ng/mL (ref 0.00–0.08)

## 2015-12-24 LAB — CBG MONITORING, ED: Glucose-Capillary: 100 mg/dL — ABNORMAL HIGH (ref 65–99)

## 2016-01-03 ENCOUNTER — Ambulatory Visit: Payer: Self-pay | Admitting: Internal Medicine

## 2016-02-02 ENCOUNTER — Encounter (HOSPITAL_COMMUNITY): Payer: Self-pay | Admitting: *Deleted

## 2016-02-02 ENCOUNTER — Emergency Department (HOSPITAL_COMMUNITY)
Admission: EM | Admit: 2016-02-02 | Discharge: 2016-02-02 | Disposition: A | Discharge status: Home / Self Care | Payer: Medicaid Other | Attending: Emergency Medicine | Admitting: Emergency Medicine

## 2016-02-02 DIAGNOSIS — R1030 Lower abdominal pain, unspecified: Secondary | ICD-10-CM

## 2016-02-02 DIAGNOSIS — E119 Type 2 diabetes mellitus without complications: Secondary | ICD-10-CM | POA: Diagnosis not present

## 2016-02-02 DIAGNOSIS — K625 Hemorrhage of anus and rectum: Secondary | ICD-10-CM | POA: Diagnosis not present

## 2016-02-02 DIAGNOSIS — R112 Nausea with vomiting, unspecified: Secondary | ICD-10-CM | POA: Diagnosis not present

## 2016-02-02 DIAGNOSIS — Z87891 Personal history of nicotine dependence: Secondary | ICD-10-CM | POA: Diagnosis not present

## 2016-02-02 DIAGNOSIS — J45909 Unspecified asthma, uncomplicated: Secondary | ICD-10-CM | POA: Diagnosis not present

## 2016-02-02 DIAGNOSIS — Z79899 Other long term (current) drug therapy: Secondary | ICD-10-CM | POA: Diagnosis not present

## 2016-02-02 DIAGNOSIS — Z7984 Long term (current) use of oral hypoglycemic drugs: Secondary | ICD-10-CM | POA: Diagnosis not present

## 2016-02-02 LAB — COMPREHENSIVE METABOLIC PANEL
ALT: 148 U/L — ABNORMAL HIGH (ref 14–54)
AST: 83 U/L — ABNORMAL HIGH (ref 15–41)
Albumin: 3.5 g/dL (ref 3.5–5.0)
Alkaline Phosphatase: 71 U/L (ref 38–126)
Anion gap: 12 (ref 5–15)
BUN: 9 mg/dL (ref 6–20)
CO2: 23 mmol/L (ref 22–32)
Calcium: 8.9 mg/dL (ref 8.9–10.3)
Chloride: 106 mmol/L (ref 101–111)
Creatinine, Ser: 0.71 mg/dL (ref 0.44–1.00)
GFR calc Af Amer: >60 mL/min (ref >60)
GFR calc non Af Amer: >60 mL/min (ref >60)
Glucose, Bld: 130 mg/dL — ABNORMAL HIGH (ref 65–99)
Potassium: 3.6 mmol/L (ref 3.5–5.1)
Sodium: 141 mmol/L (ref 135–145)
Total Bilirubin: 0.4 mg/dL (ref 0.3–1.2)
Total Protein: 7.2 g/dL (ref 6.5–8.1)

## 2016-02-02 LAB — URINALYSIS, ROUTINE W REFLEX MICROSCOPIC
Bacteria, UA: NONE SEEN
Bilirubin Urine: NEGATIVE
Glucose, UA: NEGATIVE mg/dL
Hgb urine dipstick: NEGATIVE
Ketones, ur: 20 mg/dL — AB
Leukocytes, UA: NEGATIVE
Nitrite: NEGATIVE
Protein, ur: 30 mg/dL — AB
Specific Gravity, Urine: 1.03 (ref 1.005–1.030)
pH: 6 (ref 5.0–8.0)

## 2016-02-02 LAB — CBC
HCT: 43.6 % (ref 36.0–46.0)
Hemoglobin: 14.7 g/dL (ref 12.0–15.0)
MCH: 32.2 pg (ref 26.0–34.0)
MCHC: 33.7 g/dL (ref 30.0–36.0)
MCV: 95.4 fL (ref 78.0–100.0)
Platelets: 387 10*3/uL (ref 150–400)
RBC: 4.57 MIL/uL (ref 3.87–5.11)
RDW: 13.3 % (ref 11.5–15.5)
WBC: 10.2 10*3/uL (ref 4.0–10.5)

## 2016-02-02 LAB — LIPASE, BLOOD: Lipase: 28 U/L (ref 11–51)

## 2016-02-02 MED ORDER — SODIUM CHLORIDE 0.9 % IV BOLUS (SEPSIS)
1000.0000 mL | Freq: Once | INTRAVENOUS | Status: AC
  Administered 2016-02-02: 1000 mL via INTRAVENOUS

## 2016-02-02 MED ORDER — METOCLOPRAMIDE HCL 5 MG/ML IJ SOLN
10.0000 mg | Freq: Once | INTRAMUSCULAR | Status: AC
  Administered 2016-02-02: 10 mg via INTRAVENOUS
  Filled 2016-02-02: qty 2

## 2016-02-02 MED ORDER — HYDROMORPHONE HCL 2 MG/ML IJ SOLN
1.0000 mg | Freq: Once | INTRAMUSCULAR | Status: AC
  Administered 2016-02-02: 1 mg via INTRAVENOUS
  Filled 2016-02-02: qty 1

## 2016-02-02 MED ORDER — DIPHENHYDRAMINE HCL 50 MG/ML IJ SOLN
25.0000 mg | Freq: Once | INTRAMUSCULAR | Status: AC
  Administered 2016-02-02: 25 mg via INTRAVENOUS
  Filled 2016-02-02: qty 1

## 2016-02-02 MED ORDER — ONDANSETRON HCL 4 MG/2ML IJ SOLN
4.0000 mg | Freq: Once | INTRAMUSCULAR | Status: AC
  Administered 2016-02-02: 4 mg via INTRAVENOUS
  Filled 2016-02-02: qty 2

## 2016-02-02 MED ORDER — ONDANSETRON HCL 4 MG PO TABS: 4.0000 mg | ORAL_TABLET | Freq: Four times a day (QID) | ORAL | 0 refills | Status: DC

## 2016-02-02 MED ORDER — PROMETHAZINE HCL 25 MG RE SUPP: 25.0000 mg | Freq: Four times a day (QID) | RECTAL | 0 refills | Status: DC | PRN

## 2016-02-02 MED ORDER — OXYCODONE-ACETAMINOPHEN 5-325 MG PO TABS: 1.0000 | ORAL_TABLET | ORAL | 0 refills | Status: DC | PRN

## 2016-02-02 MED ORDER — HYDROMORPHONE HCL 2 MG/ML IJ SOLN
2.0000 mg | Freq: Once | INTRAMUSCULAR | Status: AC
  Administered 2016-02-02: 2 mg via INTRAVENOUS
  Filled 2016-02-02: qty 1

## 2016-02-02 MED ORDER — HYDROCORTISONE ACETATE 25 MG RE SUPP: 25.0000 mg | Freq: Two times a day (BID) | RECTAL | 3 refills | Status: DC

## 2016-02-02 NOTE — ED Notes (Signed)
Pt given sprite for PO challenge. Will continue to monitor

## 2016-02-02 NOTE — ED Provider Notes (Signed)
[facility] DEPT Provider Note   CSN: [id number] Arrival date & time: 02/02/16  0153   By signing my name below, I, Evelene Croon, attest that this documentation has been prepared under the direction and in the presence of Orpah Greek, MD . Electronically Signed: Evelene Croon, Scribe. 02/02/2016. 2:18 AM.  History   Chief Complaint Chief Complaint  Patient presents with  . Abdominal Pain   The history is provided by the patient. No language interpreter was used.    HPI Comments:  Marisa [name] is a 40 y.o. female with a history of DM and IBS, who presents to the Emergency Department complaining of stabbing, lower abdominal pain since yesterday. She reports associated nausea, vomiting, and blood in her stool. Pt has a h/o similar symptoms and has  followed up with GI in the past.   Past Medical History:  Diagnosis Date  . Anal pain    chronic  . Anemia   . Anxiety   . Asthma    exacerbation 02-28-2014 and 02-23-2014 secondary to Rhinovirus  . Chronic headaches   . Chronic low back pain   . Cyst of right ovary   . Difficult intravenous access    PER PT NEEDS PICC LINE  . Gait instability   . History of adenomatous polyp of colon   . History of cardiac arrest    during SVD 1992  . History of ectopic pregnancy    2009-  S/P LEFT SALPINGECTOMY  . History of panic attacks   . IBS (irritable bowel syndrome)   . Lumbar stenosis L4 -- L5 with bulging disk   w/ right leg weakness/ decreased mobility  . Mild obstructive sleep apnea    study 03-20-2014  no cpap recommended  . Type 2 diabetes mellitus ([place])   . Weakness of right leg    FROM BACK PROBLEM PER PT    Patient Active Problem List   Diagnosis Date Noted  . Carbuncle of labium 07/12/2015  . Rash, skin 07/12/2015  . Dandruff 03/26/2015  . Nipple discharge in female 03/26/2015  . Migraine variant with headache 05/09/2014  . Unable to ambulate 05/09/2014  . Severe recurrent major depressive  disorder with psychotic features ([place]) 05/04/2014  . GAD (generalized anxiety disorder) 05/04/2014  . 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  . Social anxiety disorder 05/04/2014  . PTSD (post-traumatic stress disorder) 05/04/2014  . Cigarette nicotine dependence without complication [id number]  . Gait disturbance 04/11/2014  . Depression 03/27/2014  . Falls 03/27/2014  . OSA (obstructive sleep apnea) 03/06/2014  . Insomnia 03/06/2014  . Anxiety   . History of cardiac arrest   . Acute bronchitis   . Asthma exacerbation 02/20/2014  . Well controlled type 2 diabetes mellitus ([place]) 02/20/2014  . Sore throat 02/20/2014  . Chest pain 02/20/2014  . Tachycardia 02/20/2014  . Diabetes mellitus without complication ([place]) [id number]  . Pelvic pain 07/25/2013  . Rectal bleeding 07/25/2013  . Persistent vomiting 04/27/2013  . Rectal bleed 04/26/2013  . Atypical chest pain 04/26/2013  . Abdominal pain 04/26/2013  . S/P Total vaginal hysterectomy on 01/06/13 01/06/2013  . Dyspnea 09/07/2012  . 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  . Anemia 07/30/2012  . Current smoker 07/30/2012    Past Surgical History:  Procedure Laterality Date  . ABDOMINAL HYSTERECTOMY    . COLONOSCOPY Left 04/29/2013   Procedure: COLONOSCOPY;  Surgeon: Arta Silence, MD;  Location: [facility];  Service: Endoscopy;  Laterality: Left;  . EVALUATION UNDER ANESTHESIA WITH FISTULECTOMY N/A  04/20/2014   Procedure: EXAM UNDER ANESTHESIA ;  Surgeon: Leighton Ruff, MD;  Location: [facility];  Service: General;  Laterality: N/A;  . FLEXIBLE SIGMOIDOSCOPY N/A 11/09/2013   Procedure: FLEXIBLE SIGMOIDOSCOPY;  Surgeon: Arta Silence, MD;  Location: [facility];  Service: Endoscopy;  Laterality: N/A;  . LAPAROSCOPIC CHOLECYSTECTOMY  2005  . SPHINCTEROTOMY N/A 04/20/2014   Procedure:  LATERAL INTERNAL SPHINCTEROTOMY;  Surgeon: Leighton Ruff, MD;  Location: [facility];  Service: General;  Laterality: N/A;    . TRANSTHORACIC ECHOCARDIOGRAM  12-30-2012   mild LVH/  ef 55-60%  . UNILATERAL SALPINGECTOMY  2009   laparotomy left salpingectomy-- ectopic preg.  Marland Kitchen VAGINAL HYSTERECTOMY N/A 01/06/2013   Procedure: HYSTERECTOMY VAGINAL;  Surgeon: Osborne Oman, MD;  Location: [facility];  Service: Gynecology;  Laterality: N/A;    OB History    Gravida Para Term Preterm AB Living   3 1 1   2 1    SAB TAB Ectopic Multiple Live Births     1 1           Home Medications    Prior to Admission medications   Medication Sig Start Date End Date Taking? Authorizing Provider  albuterol (PROVENTIL HFA;VENTOLIN HFA) 108 (90 BASE) MCG/ACT inhaler Inhale 1-2 puffs into the lungs every 6 (six) hours as needed for wheezing or shortness of breath. 02/23/14  Yes Robbie Lis, MD  budesonide-formoterol [facility]) 160-4.5 MCG/ACT inhaler Inhale 2 puffs into the lungs 2 (two) times daily. 04/17/14  Yes Kathee Delton, MD  ipratropium-albuterol (DUONEB) 0.5-2.5 (3) MG/3ML SOLN Take 3 mLs by nebulization every 6 (six) hours as needed (for wheezing/shortness of breath).   Yes Historical Provider, MD  lithium carbonate (LITHOBID) 300 MG CR tablet Take 1 tablet (300 mg total) by mouth at bedtime. 08/29/14  Yes Leonides Grills, MD  metFORMIN (GLUCOPHAGE) 500 MG tablet Take 1 tablet (500 mg total) by mouth 2 (two) times daily with a meal. 03/26/15  Yes Tresa Garter, MD  doxycycline (VIBRAMYCIN) 100 MG capsule Take 1 capsule (100 mg total) by mouth 2 (two) times daily. Patient not taking: Reported on 02/02/2016 12/23/15   Orpah Greek, MD  hydrocortisone (ANUSOL-HC) 25 MG suppository Place 1 suppository (25 mg total) rectally 2 (two) times daily. Patient not taking: Reported on 02/02/2016 03/26/15   Tresa Garter, MD  oxyCODONE-acetaminophen (PERCOCET) 5-325 MG tablet Take 1-2 tablets by mouth every 4 (four) hours as needed. Patient not taking: Reported on 02/02/2016 12/23/15   Orpah Greek, MD     Family History Family History  Problem Relation Age of Onset  . Hypertension Mother   . Diabetes Mother   . Allergies Mother   . Heart disease Mother   . Cancer Father   . Hyperlipidemia Father   . Hypertension Father   . Heart disease Maternal Grandmother   . Schizophrenia Sister   . Bipolar disorder Sister   . Bipolar disorder Brother   . Bipolar disorder Sister     Social History Social History  Substance Use Topics  . Smoking status: Former Smoker    Packs/day: 0.20    Years: 11.00    Types: Cigarettes    Quit date: 11/17/2013  . Smokeless tobacco: Never Used  . Alcohol use No     Allergies   Mushroom extract complex; Penicillins; and Shellfish allergy   Review of Systems Review of Systems  Constitutional: Negative for chills and fever.  Respiratory: Negative for  shortness of breath.   Cardiovascular: Negative for chest pain.  All other systems reviewed and are negative.    Physical Exam Updated Vital Signs BP 111/81 (BP Location: Left Arm)   Pulse 95   Temp 97.6 F (36.4 C) (Oral)   Resp 18   Ht 5\' 5"  (1.651 m)   LMP 11/27/2012   SpO2 97%   Physical Exam  Constitutional: She is oriented to person, place, and time. She appears well-developed and well-nourished. No distress.  HENT:  Head: Normocephalic and atraumatic.  Right Ear: Hearing normal.  Left Ear: Hearing normal.  Nose: Nose normal.  Mouth/Throat: Oropharynx is clear and moist and mucous membranes are normal.  Eyes: Conjunctivae and EOM are normal. Pupils are equal, round, and reactive to light.  Neck: Normal range of motion. Neck supple.  Cardiovascular: Regular rhythm, S1 normal and S2 normal.  Exam reveals no gallop and no friction rub.   No murmur heard. Pulmonary/Chest: Effort normal and breath sounds normal. No respiratory distress. She exhibits no tenderness.  Abdominal: Soft. Normal appearance and bowel sounds are normal. There is no hepatosplenomegaly. There is tenderness in  the right lower quadrant, suprapubic area and left lower quadrant. There is no rebound, no guarding, no tenderness at McBurney's point and negative Murphy's sign. No hernia.  Musculoskeletal: Normal range of motion.  Neurological: She is alert and oriented to person, place, and time. She has normal strength. No cranial nerve deficit or sensory deficit. Coordination normal. GCS eye subscore is 4. GCS verbal subscore is 5. GCS motor subscore is 6.  Skin: Skin is warm, dry and intact. No rash noted. No cyanosis.  Psychiatric: She has a normal mood and affect. Her speech is normal and behavior is normal. Thought content normal.  Nursing note and vitals reviewed.    ED Treatments / Results  DIAGNOSTIC STUDIES:  Oxygen Saturation is 100% on RA, normal by my interpretation.    COORDINATION OF CARE:  2:15 AM Discussed treatment plan with pt at bedside and pt agreed to plan.  Labs (all labs ordered are listed, but only abnormal results are displayed) Labs Reviewed  COMPREHENSIVE METABOLIC PANEL - Abnormal; Notable for the following:       Result Value   Glucose, Bld 130 (*)    AST 83 (*)    ALT 148 (*)    All other components within normal limits  URINALYSIS, ROUTINE W REFLEX MICROSCOPIC - Abnormal; Notable for the following:    APPearance HAZY (*)    Ketones, ur 20 (*)    Protein, ur 30 (*)    Squamous Epithelial / LPF 0-5 (*)    All other components within normal limits  LIPASE, BLOOD  CBC    EKG  EKG Interpretation None       Radiology No results found.  Procedures Procedures (including critical care time)  Medications Ordered in ED Medications  HYDROmorphone (DILAUDID) injection 2 mg (not administered)  sodium chloride 0.9 % bolus 1,000 mL (0 mLs Intravenous Stopped 02/02/16 0415)  HYDROmorphone (DILAUDID) injection 1 mg (1 mg Intravenous Given 02/02/16 0246)  ondansetron (ZOFRAN) injection 4 mg (4 mg Intravenous Given 02/02/16 0246)     Initial Impression /  Assessment and Plan / ED Course  I have reviewed the triage vital signs and the nursing notes.  Pertinent labs & imaging results that were available during my care of the patient were reviewed by me and considered in my medical decision making (see chart for details).  Clinical  Course    Patient presents with complaints of lower abdominal pain. She has been seen for this previously. Patient reports symptoms are similar. She is having persistent pain with passage of red blood in her stools. When I saw her for this complaint last month her workup was entirely negative, including CT scan of abdomen and pelvis. Patient does not have any peritoneal signs today.   Vital signs are stable. No sign of active infection or sepsis. She was tachycardic at arrival but this resolved with fluids and pain medication. Patient is scheduled for colonoscopy in 3 weeks. Do not see indication for more urgent evaluation. Patient treated with analgesia here in the ER.  Final Clinical Impressions(s) / ED Diagnoses   Final diagnoses:  Lower abdominal pain  Rectal bleeding    New Prescriptions New Prescriptions   No medications on file   I personally performed the services described in this documentation, which was scribed in my presence. The recorded information has been reviewed and is accurate.     Orpah Greek, MD 02/02/16 [phone]

## 2016-02-02 NOTE — ED Triage Notes (Signed)
The pt is c/o abd pain  For 2-3 days with dark blood coming from her rectum  Hx of cancer and rectal polyps   lmp none  Diarrhea stools

## 2016-02-02 NOTE — ED Notes (Signed)
Pt called RN to room again, pt requesting additional pain medication. MD aware

## 2016-02-05 NOTE — Therapy (Signed)
[facility] [address] [place], [place], [address] Phone: [phone]   Fax:  [phone]  Patient Details  Name: Marisa [name] MRN: [id number] Date of Birth: 07/24/1975 Referring Provider:  No ref. provider found  Encounter Date: 02/05/2016  PHYSICAL THERAPY DISCHARGE SUMMARY  Visits from Start of Care: 5  Current functional level related to goals / functional outcomes:     PT Long Term Goals - 08/28/15 1703      PT LONG TERM GOAL #1   Title Pt will be IND in HEP to improve strength and balance. Target date: 08/21/15. All unmet goals will be carried over to new POC. 09/22/15   Baseline No HEP   Status Partially Met     PT LONG TERM GOAL #2   Title Pt will amb. 150' with LRAD over even terrain and min guard to improve functional mobility. Target date: 08/21/15   Baseline 10' with rollator and Mod A over even terrain   Status Not Met     PT LONG TERM GOAL #3   Title Perform BERG and write goal. Target date: 08/21/15   Baseline No BERG score   Status Deferred     PT LONG TERM GOAL #4   Title Pt will perform Sit to stand txfs with UE assist x5 reps with min guard to improve functional mobilty and independence. Target date: 08/21/15   Baseline one sit<>stand txf performed with mod A   Status Achieved     PT LONG TERM GOAL #5   Title Assess bed mobility and write goal. Target date: 08/21/15   Baseline performed with S, no goal needed.   Status Achieved     PT LONG TERM GOAL #6   Title Pt will improve ABC score from 3.8% to 17.8% to improve confidence in balance and quality of life. Target date: 08/21/15   Baseline 3.8%   Status On-going        Remaining deficits: Unknown, as pt did not return after fifth visit.   Education / Equipment: HEP.  Plan: Patient agrees to discharge.  Patient goals were partially met. Patient is being discharged due to not returning since the last visit.  ?????        , L 02/05/2016, 4:11 PM  [facility] [address] [place], [place], [address] Phone: [phone]   Fax:  [phone]    , PT,DPT 02/05/16 4:12 PM Phone: [phone] Fax: [phone]   

## 2016-02-26 ENCOUNTER — Emergency Department (HOSPITAL_COMMUNITY): Payer: Medicaid Other

## 2016-02-26 ENCOUNTER — Encounter (HOSPITAL_COMMUNITY): Payer: Self-pay | Admitting: Emergency Medicine

## 2016-02-26 ENCOUNTER — Emergency Department (HOSPITAL_COMMUNITY)
Admission: EM | Admit: 2016-02-26 | Discharge: 2016-02-26 | Disposition: A | Discharge status: Home / Self Care | Payer: Medicaid Other | Attending: Emergency Medicine | Admitting: Emergency Medicine

## 2016-02-26 DIAGNOSIS — J4521 Mild intermittent asthma with (acute) exacerbation: Secondary | ICD-10-CM | POA: Insufficient documentation

## 2016-02-26 DIAGNOSIS — Z87891 Personal history of nicotine dependence: Secondary | ICD-10-CM | POA: Insufficient documentation

## 2016-02-26 DIAGNOSIS — E119 Type 2 diabetes mellitus without complications: Secondary | ICD-10-CM | POA: Diagnosis not present

## 2016-02-26 DIAGNOSIS — Z7984 Long term (current) use of oral hypoglycemic drugs: Secondary | ICD-10-CM | POA: Insufficient documentation

## 2016-02-26 DIAGNOSIS — R0602 Shortness of breath: Secondary | ICD-10-CM | POA: Diagnosis present

## 2016-02-26 DIAGNOSIS — J40 Bronchitis, not specified as acute or chronic: Secondary | ICD-10-CM | POA: Diagnosis not present

## 2016-02-26 DIAGNOSIS — F10120 Alcohol abuse with intoxication, uncomplicated: Secondary | ICD-10-CM | POA: Insufficient documentation

## 2016-02-26 DIAGNOSIS — F10920 Alcohol use, unspecified with intoxication, uncomplicated: Secondary | ICD-10-CM

## 2016-02-26 LAB — CBC WITH DIFFERENTIAL/PLATELET
Basophils Absolute: 0 10*3/uL (ref 0.0–0.1)
Basophils Relative: 0 %
Eosinophils Absolute: 0.1 10*3/uL (ref 0.0–0.7)
Eosinophils Relative: 2 %
HCT: 41.7 % (ref 36.0–46.0)
Hemoglobin: 13.9 g/dL (ref 12.0–15.0)
Lymphocytes Relative: 47 %
Lymphs Abs: 4.5 10*3/uL — ABNORMAL HIGH (ref 0.7–4.0)
MCH: 32.5 pg (ref 26.0–34.0)
MCHC: 33.3 g/dL (ref 30.0–36.0)
MCV: 97.4 fL (ref 78.0–100.0)
Monocytes Absolute: 0.5 10*3/uL (ref 0.1–1.0)
Monocytes Relative: 5 %
Neutro Abs: 4.3 10*3/uL (ref 1.7–7.7)
Neutrophils Relative %: 46 %
Platelets: 322 10*3/uL (ref 150–400)
RBC: 4.28 MIL/uL (ref 3.87–5.11)
RDW: 13.4 % (ref 11.5–15.5)
WBC: 9.5 10*3/uL (ref 4.0–10.5)

## 2016-02-26 LAB — I-STAT TROPONIN, ED: Troponin i, poc: 0 ng/mL (ref 0.00–0.08)

## 2016-02-26 LAB — COMPREHENSIVE METABOLIC PANEL
ALT: 113 U/L — ABNORMAL HIGH (ref 14–54)
AST: 63 U/L — ABNORMAL HIGH (ref 15–41)
Albumin: 3.3 g/dL — ABNORMAL LOW (ref 3.5–5.0)
Alkaline Phosphatase: 63 U/L (ref 38–126)
Anion gap: 11 (ref 5–15)
BUN: <5 mg/dL — ABNORMAL LOW (ref 6–20)
CO2: 22 mmol/L (ref 22–32)
Calcium: 8.6 mg/dL — ABNORMAL LOW (ref 8.9–10.3)
Chloride: 107 mmol/L (ref 101–111)
Creatinine, Ser: 0.61 mg/dL (ref 0.44–1.00)
GFR calc Af Amer: >60 mL/min (ref >60)
GFR calc non Af Amer: >60 mL/min (ref >60)
Glucose, Bld: 158 mg/dL — ABNORMAL HIGH (ref 65–99)
Potassium: 3.4 mmol/L — ABNORMAL LOW (ref 3.5–5.1)
Sodium: 140 mmol/L (ref 135–145)
Total Bilirubin: 0.3 mg/dL (ref 0.3–1.2)
Total Protein: 6.6 g/dL (ref 6.5–8.1)

## 2016-02-26 LAB — BRAIN NATRIURETIC PEPTIDE: B Natriuretic Peptide: 10.9 pg/mL (ref 0.0–100.0)

## 2016-02-26 LAB — ETHANOL: Alcohol, Ethyl (B): 195 mg/dL — ABNORMAL HIGH (ref <5)

## 2016-02-26 MED ORDER — AZITHROMYCIN 250 MG PO TABS: 250.0000 mg | ORAL_TABLET | Freq: Every day | ORAL | 0 refills | Status: DC

## 2016-02-26 MED ORDER — BENZONATATE 100 MG PO CAPS: 100.0000 mg | ORAL_CAPSULE | Freq: Three times a day (TID) | ORAL | 0 refills | Status: DC

## 2016-02-26 NOTE — ED Provider Notes (Signed)
[facility] DEPT Provider Note   CSN: [id number] Arrival date & time: 02/26/16  0426     History   Chief Complaint Chief Complaint  Patient presents with  . Shortness of Breath  . Headache    HPI Marisa [name] is a 42 y.o. female.  Patient presents to the emergency department for evaluation of shortness of breath. Patient reportedly being short of breath for 3 days. She does have a history of asthma. EMS report that patient was dyspneic and wheezing upon arrival. Patient has received albuterol and a DuoNeb with some improvement. Patient admits to drinking alcohol tonight as well.      Past Medical History:  Diagnosis Date  . Anal pain    chronic  . Anemia   . Anxiety   . Asthma    exacerbation 02-28-2014 and 02-23-2014 secondary to Rhinovirus  . Chronic headaches   . Chronic low back pain   . Cyst of right ovary   . Difficult intravenous access    PER PT NEEDS PICC LINE  . Gait instability   . History of adenomatous polyp of colon   . History of cardiac arrest    during SVD 1992  . History of ectopic pregnancy    2009-  S/P LEFT SALPINGECTOMY  . History of panic attacks   . IBS (irritable bowel syndrome)   . Lumbar stenosis L4 -- L5 with bulging disk   w/ right leg weakness/ decreased mobility  . Mild obstructive sleep apnea    study 03-20-2014  no cpap recommended  . Type 2 diabetes mellitus ([place])   . Weakness of right leg    FROM BACK PROBLEM PER PT    Patient Active Problem List   Diagnosis Date Noted  . Carbuncle of labium 07/12/2015  . Rash, skin 07/12/2015  . Dandruff 03/26/2015  . Nipple discharge in female 03/26/2015  . Migraine variant with headache 05/09/2014  . Unable to ambulate 05/09/2014  . Severe recurrent major depressive disorder with psychotic features ([place]) 05/04/2014  . GAD (generalized anxiety disorder) 05/04/2014  . 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  . Social anxiety disorder 05/04/2014  . PTSD  (post-traumatic stress disorder) 05/04/2014  . Cigarette nicotine dependence without complication [id number]  . Gait disturbance 04/11/2014  . Depression 03/27/2014  . Falls 03/27/2014  . OSA (obstructive sleep apnea) 03/06/2014  . Insomnia 03/06/2014  . Anxiety   . History of cardiac arrest   . Acute bronchitis   . Asthma exacerbation 02/20/2014  . Well controlled type 2 diabetes mellitus ([place]) 02/20/2014  . Sore throat 02/20/2014  . Chest pain 02/20/2014  . Tachycardia 02/20/2014  . Diabetes mellitus without complication ([place]) [id number]  . Pelvic pain 07/25/2013  . Rectal bleeding 07/25/2013  . Persistent vomiting 04/27/2013  . Rectal bleed 04/26/2013  . Atypical chest pain 04/26/2013  . Abdominal pain 04/26/2013  . S/P Total vaginal hysterectomy on 01/06/13 01/06/2013  . Dyspnea 09/07/2012  . 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  . Anemia 07/30/2012  . Current smoker 07/30/2012    Past Surgical History:  Procedure Laterality Date  . ABDOMINAL HYSTERECTOMY    . COLONOSCOPY Left 04/29/2013   Procedure: COLONOSCOPY;  Surgeon: Arta Silence, MD;  Location: [facility];  Service: Endoscopy;  Laterality: Left;  . EVALUATION UNDER ANESTHESIA WITH FISTULECTOMY N/A 04/20/2014   Procedure: EXAM UNDER ANESTHESIA ;  Surgeon: Leighton Ruff, MD;  Location: [facility];  Service: General;  Laterality: N/A;  . FLEXIBLE SIGMOIDOSCOPY N/A 11/09/2013   Procedure:  FLEXIBLE SIGMOIDOSCOPY;  Surgeon: Arta Silence, MD;  Location: [facility];  Service: Endoscopy;  Laterality: N/A;  . LAPAROSCOPIC CHOLECYSTECTOMY  2005  . SPHINCTEROTOMY N/A 04/20/2014   Procedure:  LATERAL INTERNAL SPHINCTEROTOMY;  Surgeon: Leighton Ruff, MD;  Location: [facility];  Service: General;  Laterality: N/A;  . TRANSTHORACIC ECHOCARDIOGRAM  12-30-2012   mild LVH/  ef 55-60%  . UNILATERAL SALPINGECTOMY  2009   laparotomy left salpingectomy-- ectopic preg.  Marland Kitchen VAGINAL HYSTERECTOMY N/A 01/06/2013    Procedure: HYSTERECTOMY VAGINAL;  Surgeon: Osborne Oman, MD;  Location: [facility];  Service: Gynecology;  Laterality: N/A;    OB History    Gravida Para Term Preterm AB Living   3 1 1   2 1    SAB TAB Ectopic Multiple Live Births     1 1           Home Medications    Prior to Admission medications   Medication Sig Start Date End Date Taking? Authorizing Provider  albuterol (PROVENTIL HFA;VENTOLIN HFA) 108 (90 BASE) MCG/ACT inhaler Inhale 1-2 puffs into the lungs every 6 (six) hours as needed for wheezing or shortness of breath. 02/23/14  Yes Robbie Lis, MD  budesonide-formoterol [facility]) 160-4.5 MCG/ACT inhaler Inhale 2 puffs into the lungs 2 (two) times daily. 04/17/14  Yes Kathee Delton, MD  hydrocortisone (ANUSOL-HC) 25 MG suppository Place 1 suppository (25 mg total) rectally 2 (two) times daily. Patient taking differently: Place 25 mg rectally 2 (two) times daily as needed for hemorrhoids or itching.  02/02/16  Yes Orpah Greek, MD  ipratropium-albuterol (DUONEB) 0.5-2.5 (3) MG/3ML SOLN Take 3 mLs by nebulization every 6 (six) hours as needed (for wheezing/shortness of breath).   Yes Historical Provider, MD  lithium carbonate (LITHOBID) 300 MG CR tablet Take 1 tablet (300 mg total) by mouth at bedtime. 08/29/14  Yes Leonides Grills, MD  metFORMIN (GLUCOPHAGE) 500 MG tablet Take 1 tablet (500 mg total) by mouth 2 (two) times daily with a meal. 03/26/15  Yes Olugbemiga E Doreene Burke, MD  ondansetron (ZOFRAN) 4 MG tablet Take 1 tablet (4 mg total) by mouth every 6 (six) hours. Patient taking differently: Take 4 mg by mouth every 8 (eight) hours as needed for nausea or vomiting.  02/02/16  Yes Orpah Greek, MD  oxyCODONE-acetaminophen (PERCOCET) 5-325 MG tablet Take 1-2 tablets by mouth every 4 (four) hours as needed. Patient taking differently: Take 1-2 tablets by mouth every 4 (four) hours as needed for moderate pain.  02/02/16  Yes Orpah Greek, MD    promethazine (PHENERGAN) 25 MG suppository Place 1 suppository (25 mg total) rectally every 6 (six) hours as needed for nausea or vomiting. 02/02/16  Yes Orpah Greek, MD    Family History Family History  Problem Relation Age of Onset  . Hypertension Mother   . Diabetes Mother   . Allergies Mother   . Heart disease Mother   . Cancer Father   . Hyperlipidemia Father   . Hypertension Father   . Heart disease Maternal Grandmother   . Schizophrenia Sister   . Bipolar disorder Sister   . Bipolar disorder Brother   . Bipolar disorder Sister     Social History Social History  Substance Use Topics  . Smoking status: Former Smoker    Packs/day: 0.20    Years: 11.00    Types: Cigarettes    Quit date: 11/17/2013  . Smokeless tobacco: Never Used  . Alcohol use No  Allergies   Mushroom extract complex; Penicillins; and Shellfish allergy   Review of Systems Review of Systems  Respiratory: Positive for shortness of breath and wheezing.   All other systems reviewed and are negative.    Physical Exam Updated Vital Signs BP 99/68 (BP Location: Right Arm)   Pulse 107   Temp 97.8 F (36.6 C) (Oral)   Resp 17   Ht 5\' 5"  (1.651 m)   Wt 250 lb (113.4 kg)   LMP 11/27/2012   SpO2 99%   BMI 41.60 kg/m   Physical Exam  Constitutional: She is oriented to person, place, and time. She appears well-developed and well-nourished. No distress.  HENT:  Head: Normocephalic and atraumatic.  Right Ear: Hearing normal.  Left Ear: Hearing normal.  Nose: Nose normal.  Mouth/Throat: Oropharynx is clear and moist and mucous membranes are normal.  Eyes: Conjunctivae and EOM are normal. Pupils are equal, round, and reactive to light.  Neck: Normal range of motion. Neck supple.  Cardiovascular: Regular rhythm, S1 normal and S2 normal.  Exam reveals no gallop and no friction rub.   No murmur heard. Pulmonary/Chest: Effort normal. No respiratory distress. She has wheezes. She  exhibits no tenderness.  Abdominal: Soft. Normal appearance and bowel sounds are normal. There is no hepatosplenomegaly. There is no tenderness. There is no rebound, no guarding, no tenderness at McBurney's point and negative Murphy's sign. No hernia.  Musculoskeletal: Normal range of motion.  Neurological: She is alert and oriented to person, place, and time. She has normal strength. No cranial nerve deficit or sensory deficit. Coordination normal. GCS eye subscore is 4. GCS verbal subscore is 5. GCS motor subscore is 6.  Skin: Skin is warm, dry and intact. No rash noted. No cyanosis.  Psychiatric: She has a normal mood and affect. Her speech is normal and behavior is normal. Thought content normal.  Nursing note and vitals reviewed.    ED Treatments / Results  Labs (all labs ordered are listed, but only abnormal results are displayed) Labs Reviewed  CBC WITH DIFFERENTIAL/PLATELET - Abnormal; Notable for the following:       Result Value   Lymphs Abs 4.5 (*)    All other components within normal limits  COMPREHENSIVE METABOLIC PANEL - Abnormal; Notable for the following:    Potassium 3.4 (*)    Glucose, Bld 158 (*)    BUN <5 (*)    Calcium 8.6 (*)    Albumin 3.3 (*)    AST 63 (*)    ALT 113 (*)    All other components within normal limits  ETHANOL - Abnormal; Notable for the following:    Alcohol, Ethyl (B) 195 (*)    All other components within normal limits  BRAIN NATRIURETIC PEPTIDE  I-STAT TROPOININ, ED    EKG  EKG Interpretation  Date/Time:  Tuesday February 26 2016 05:52:17 EST Ventricular Rate:  102 PR Interval:    QRS Duration: 88 QT Interval:  347 QTC Calculation: 452 R Axis:   -20 Text Interpretation:  Sinus tachycardia Borderline left axis deviation Baseline wander in lead(s) V6 Otherwise within normal limits Confirmed by Vitaly Wanat  MD, Tayson Schnelle [phone]) on 02/26/2016 7:25:33 AM       Radiology Dg Chest 2 View  Result Date: 02/26/2016 CLINICAL DATA:   Shortness of breath and headache for 3 days. History of asthma and diabetes. Wheezing and blurred vision. EXAM: CHEST  2 VIEW COMPARISON:  12/23/2015 FINDINGS: Examination is limited due to body habitus resulting in  poor penetration. Shallow inspiration with linear atelectasis in the left lung base. Normal heart size and pulmonary vascularity. No focal airspace consolidation in the lungs. No blunting of costophrenic angles. No pneumothorax. IMPRESSION: Shallow inspiration with atelectasis in the lung bases. Electronically Signed   By: Lucienne Capers M.D.   On: 02/26/2016 05:24    Procedures Procedures (including critical care time)  Medications Ordered in ED Medications - No data to display   Initial Impression / Assessment and Plan / ED Course  I have reviewed the triage vital signs and the nursing notes.  Pertinent labs & imaging results that were available during my care of the patient were reviewed by me and considered in my medical decision making (see chart for details).  Clinical Course    Patient called out to EMS for difficulty breathing. EMS report that she did have some mild wheezing which improved with bronchodilator therapy. Patient also admits to taking alcohol and is obviously intoxicated. She has been experiencing some "blurred vision" which is likely secondary to the intoxication. There are no other noted neurologic deficits on examination. Cardiac evaluation is negative. Chest x-ray does not show evidence of pneumonia. Patient reassured, appropriate for outpatient management of her asthma.  Final Clinical Impressions(s) / ED Diagnoses   Final diagnoses:  Mild intermittent asthma with exacerbation  Alcoholic intoxication without complication [facility])    New Prescriptions New Prescriptions   No medications on file     Orpah Greek, MD 02/26/16 [phone]

## 2016-02-26 NOTE — ED Triage Notes (Signed)
BIB EMS from home, reports SOB and HA X3 days. Hx asthma. Wheezing en route, given 1albuterol and 1 duoneb. ETOH on board. Reports blurred vision. EDP at bedside. CBG 199

## 2016-03-06 ENCOUNTER — Encounter: Payer: Self-pay | Admitting: Internal Medicine

## 2016-03-07 NOTE — Telephone Encounter (Signed)
Patient would like to be advised on HA's with dizziness and rectal bleeding with dark "tar like" color.

## 2016-04-10 ENCOUNTER — Telehealth: Payer: Self-pay | Admitting: Internal Medicine

## 2016-04-10 NOTE — Telephone Encounter (Addendum)
Intermittent chest pain, lots of boils in vaginal area, has floaters in vision. Feels like skin is crawling, burning sensation, swelling and numbness in legs.  Stabbing pain in right side, upper quadrant. She is out of DM medications and has not taken medications in 2 mos for DM. She states she feels tired, drinking and using the bathroom a lot. Explained s/s of hyperglycemia. Encouraged to drink water. Pt has apt for Wednesday 2/28. Advised to go to [facility] or ED for worsening sx's. Pt verbalized understanding.

## 2016-04-10 NOTE — Telephone Encounter (Signed)
Pt called stating she has been experiencing lower abdomin pain and has been having chest pain on her left side every night for a few days.  Pt state she has been experiencing extreme headaches that cause her to have trouble seeing. Pt also has a burning sensation that goes throughout her legs and her legs and hands are starting to swell, as well as a rash is forming on both wrists   Pt states she is a diabetic and has not checked her sugar in about a month and a half    Scheduled pt with PCP on Wednesday, 2/28

## 2016-04-16 ENCOUNTER — Encounter: Payer: Self-pay | Admitting: Internal Medicine

## 2016-04-16 ENCOUNTER — Ambulatory Visit: Payer: Medicaid Other | Attending: Internal Medicine | Admitting: Internal Medicine

## 2016-04-16 VITALS — BP 142/95 | HR 96 | Temp 98.2°F | Resp 16 | Wt 272.0 lb

## 2016-04-16 DIAGNOSIS — M48061 Spinal stenosis, lumbar region without neurogenic claudication: Secondary | ICD-10-CM | POA: Diagnosis not present

## 2016-04-16 DIAGNOSIS — D649 Anemia, unspecified: Secondary | ICD-10-CM | POA: Diagnosis not present

## 2016-04-16 DIAGNOSIS — G4733 Obstructive sleep apnea (adult) (pediatric): Secondary | ICD-10-CM | POA: Diagnosis not present

## 2016-04-16 DIAGNOSIS — R531 Weakness: Secondary | ICD-10-CM | POA: Insufficient documentation

## 2016-04-16 DIAGNOSIS — I10 Essential (primary) hypertension: Secondary | ICD-10-CM | POA: Diagnosis not present

## 2016-04-16 DIAGNOSIS — N83201 Unspecified ovarian cyst, right side: Secondary | ICD-10-CM | POA: Insufficient documentation

## 2016-04-16 DIAGNOSIS — F419 Anxiety disorder, unspecified: Secondary | ICD-10-CM | POA: Diagnosis not present

## 2016-04-16 DIAGNOSIS — R51 Headache: Secondary | ICD-10-CM | POA: Diagnosis present

## 2016-04-16 DIAGNOSIS — Z8674 Personal history of sudden cardiac arrest: Secondary | ICD-10-CM | POA: Insufficient documentation

## 2016-04-16 DIAGNOSIS — E119 Type 2 diabetes mellitus without complications: Secondary | ICD-10-CM | POA: Insufficient documentation

## 2016-04-16 DIAGNOSIS — G8929 Other chronic pain: Secondary | ICD-10-CM | POA: Insufficient documentation

## 2016-04-16 DIAGNOSIS — Z7984 Long term (current) use of oral hypoglycemic drugs: Secondary | ICD-10-CM | POA: Insufficient documentation

## 2016-04-16 DIAGNOSIS — G43509 Persistent migraine aura without cerebral infarction, not intractable, without status migrainosus: Secondary | ICD-10-CM | POA: Insufficient documentation

## 2016-04-16 DIAGNOSIS — H00015 Hordeolum externum left lower eyelid: Secondary | ICD-10-CM

## 2016-04-16 DIAGNOSIS — R109 Unspecified abdominal pain: Secondary | ICD-10-CM | POA: Diagnosis present

## 2016-04-16 DIAGNOSIS — R21 Rash and other nonspecific skin eruption: Secondary | ICD-10-CM | POA: Insufficient documentation

## 2016-04-16 DIAGNOSIS — Z880 Allergy status to penicillin: Secondary | ICD-10-CM | POA: Insufficient documentation

## 2016-04-16 DIAGNOSIS — M545 Low back pain: Secondary | ICD-10-CM | POA: Insufficient documentation

## 2016-04-16 DIAGNOSIS — R079 Chest pain, unspecified: Secondary | ICD-10-CM | POA: Diagnosis present

## 2016-04-16 DIAGNOSIS — J45909 Unspecified asthma, uncomplicated: Secondary | ICD-10-CM | POA: Diagnosis not present

## 2016-04-16 DIAGNOSIS — K625 Hemorrhage of anus and rectum: Secondary | ICD-10-CM | POA: Insufficient documentation

## 2016-04-16 LAB — GLUCOSE, POCT (MANUAL RESULT ENTRY): POC Glucose: 201 mg/dl — AB (ref 70–99)

## 2016-04-16 LAB — POCT GLYCOSYLATED HEMOGLOBIN (HGB A1C): Hemoglobin A1C: 7

## 2016-04-16 MED ORDER — ACCU-CHEK AVIVA PLUS W/DEVICE KIT: 1.0000 | PACK | Freq: Three times a day (TID) | 0 refills | Status: DC

## 2016-04-16 MED ORDER — SUMATRIPTAN SUCCINATE 25 MG PO TABS: 25.0000 mg | ORAL_TABLET | Freq: Once | ORAL | 0 refills | Status: DC

## 2016-04-16 MED ORDER — TRUEPLUS LANCETS 28G MISC: 1.0000 | Freq: Three times a day (TID) | 12 refills | Status: DC

## 2016-04-16 MED ORDER — METFORMIN HCL 500 MG PO TABS: 500.0000 mg | ORAL_TABLET | Freq: Two times a day (BID) | ORAL | 3 refills | Status: DC

## 2016-04-16 MED ORDER — ACCU-CHEK SOFTCLIX LANCET DEV MISC: 12 refills | Status: DC

## 2016-04-16 MED ORDER — GLUCOSE BLOOD VI STRP: ORAL_STRIP | 12 refills | Status: DC

## 2016-04-16 MED ORDER — TRIAMCINOLONE ACETONIDE 0.5 % EX OINT: 1.0000 "application " | TOPICAL_OINTMENT | Freq: Two times a day (BID) | CUTANEOUS | 0 refills | Status: DC

## 2016-04-16 MED ORDER — HYDROCORTISONE ACETATE 25 MG RE SUPP: 25.0000 mg | Freq: Two times a day (BID) | RECTAL | 3 refills | Status: DC

## 2016-04-16 MED ORDER — TRUE METRIX METER W/DEVICE KIT: 1.0000 | PACK | Freq: Three times a day (TID) | 0 refills | Status: DC

## 2016-04-16 MED ORDER — BACITRACIN-POLYMYXIN B 500-10000 UNIT/GM OP OINT: 1.0000 "application " | TOPICAL_OINTMENT | Freq: Two times a day (BID) | OPHTHALMIC | 0 refills | Status: DC

## 2016-04-16 NOTE — Patient Instructions (Signed)
Diabetes and Foot Care Diabetes may cause you to have problems because of poor blood supply (circulation) to your feet and legs. This may cause the skin on your feet to become thinner, break easier, and heal more slowly. Your skin may become dry, and the skin may peel and crack. You may also have nerve damage in your legs and feet causing decreased feeling in them. You may not notice minor injuries to your feet that could lead to infections or more serious problems. Taking care of your feet is one of the most important things you can do for yourself. Follow these instructions at home:  Wear shoes at all times, even in the house. Do not go barefoot. Bare feet are easily injured.  Check your feet daily for blisters, cuts, and redness. If you cannot see the bottom of your feet, use a mirror or ask someone for help.  Wash your feet with warm water (do not use hot water) and mild soap. Then pat your feet and the areas between your toes until they are completely dry. Do not soak your feet as this can dry your skin.  Apply a moisturizing lotion or petroleum jelly (that does not contain alcohol and is unscented) to the skin on your feet and to dry, brittle toenails. Do not apply lotion between your toes.  Trim your toenails straight across. Do not dig under them or around the cuticle. File the edges of your nails with an emery board or nail file.  Do not cut corns or calluses or try to remove them with medicine.  Wear clean socks or stockings every day. Make sure they are not too tight. Do not wear knee-high stockings since they may decrease blood flow to your legs.  Wear shoes that fit properly and have enough cushioning. To break in new shoes, wear them for just a few hours a day. This prevents you from injuring your feet. Always look in your shoes before you put them on to be sure there are no objects inside.  Do not cross your legs. This may decrease the blood flow to your feet.  If you find a  minor scrape, cut, or break in the skin on your feet, keep it and the skin around it clean and dry. These areas may be cleansed with mild soap and water. Do not cleanse the area with peroxide, alcohol, or iodine.  When you remove an adhesive bandage, be sure not to damage the skin around it.  If you have a wound, look at it several times a day to make sure it is healing.  Do not use heating pads or hot water bottles. They may burn your skin. If you have lost feeling in your feet or legs, you may not know it is happening until it is too late.  Make sure your health care provider performs a complete foot exam at least annually or more often if you have foot problems. Report any cuts, sores, or bruises to your health care provider immediately. Contact a health care provider if:  You have an injury that is not healing.  You have cuts or breaks in the skin.  You have an ingrown nail.  You notice redness on your legs or feet.  You feel burning or tingling in your legs or feet.  You have pain or cramps in your legs and feet.  Your legs or feet are numb.  Your feet always feel cold. Get help right away if:  There is increasing   redness, swelling, or pain in or around a wound.  There is a red line that goes up your leg.  Pus is coming from a wound.  You develop a fever or as directed by your health care provider.  You notice a bad smell coming from an ulcer or wound. This information is not intended to replace advice given to you by your health care provider. Make sure you discuss any questions you have with your health care provider. Document Released: 02/01/2000 Document Revised: 07/12/2015 Document Reviewed: 07/13/2012 Elsevier Interactive Patient Education  2017 Else[employer] Inc. Blood Glucose Monitoring, Adult Monitoring your blood sugar (glucose) helps you manage your diabetes. It also helps you and your health care provider determine how well your diabetes management plan is  working. Blood glucose monitoring involves checking your blood glucose as often as directed, and keeping a record (log) of your results over time. Why should I monitor my blood glucose? Checking your blood glucose regularly can:  Help you understand how food, exercise, illnesses, and medicines affect your blood glucose.  Let you know what your blood glucose is at any time. You can quickly tell if you are having low blood glucose (hypoglycemia) or high blood glucose (hyperglycemia).  Help you and your health care provider adjust your medicines as needed. When should I check my blood glucose? Follow instructions from your health care provider about how often to check your blood glucose. This may depend on:  The type of diabetes you have.  How well-controlled your diabetes is.  Medicines you are taking. If you have type 1 diabetes:  Check your blood glucose at least 2 times a day.  Also check your blood glucose:  Before every insulin injection.  Before and after exercise.  Between meals.  2 hours after a meal.  Occasionally between 2:00 a.m. and 3:00 a.m., as directed.  Before potentially dangerous tasks, like driving or using heavy machinery.  At bedtime.  You may need to check your blood glucose more often, up to 6-10 times a day:  If you use an insulin pump.  If you need multiple daily injections (MDI).  If your diabetes is not well-controlled.  If you are ill.  If you have a history of severe hypoglycemia.  If you have a history of not knowing when your blood glucose is getting low (hypoglycemia unawareness). If you have type 2 diabetes:  If you take insulin or other diabetes medicines, check your blood glucose at least 2 times a day.  If you are on intensive insulin therapy, check your blood glucose at least 4 times a day. Occasionally, you may also need to check between 2:00 a.m. and 3:00 a.m., as directed.  Also check your blood glucose:  Before and after  exercise.  Before potentially dangerous tasks, like driving or using heavy machinery.  You may need to check your blood glucose more often if:  Your medicine is being adjusted.  Your diabetes is not well-controlled.  You are ill. What is a blood glucose log?  A blood glucose log is a record of your blood glucose readings. It helps you and your health care provider:  Look for patterns in your blood glucose over time.  Adjust your diabetes management plan as needed.  Every time you check your blood glucose, write down your result and notes about things that may be affecting your blood glucose, such as your diet and exercise for the day.  Most glucose meters store a record of glucose readings in   the meter. Some meters allow you to download your records to a computer. How do I check my blood glucose? Follow these steps to get accurate readings of your blood glucose: Supplies needed   Blood glucose meter.  Test strips for your meter. Each meter has its own strips. You must use the strips that come with your meter.  A needle to prick your finger (lancet). Do not use lancets more than once.  A device that holds the lancet (lancing device).  A journal or log book to write down your results. Procedure  Wash your hands with soap and water.  Prick the side of your finger (not the tip) with the lancet. Use a different finger each time.  Gently rub the finger until a small drop of blood appears.  Follow instructions that come with your meter for inserting the test strip, applying blood to the strip, and using your blood glucose meter.  Write down your result and any notes. Alternative testing sites  Some meters allow you to use areas of your body other than your finger (alternative sites) to test your blood.  If you think you may have hypoglycemia, or if you have hypoglycemia unawareness, do not use alternative sites. Use your finger instead.  Alternative sites may not be as  accurate as the fingers, because blood flow is slower in these areas. This means that the result you get may be delayed, and it may be different from the result that you would get from your finger.  The most common alternative sites are:  Forearm.  Thigh.  Palm of the hand. Additional tips  Always keep your supplies with you.  If you have questions or need help, all blood glucose meters have a 24-hour "hotline" number that you can call. You may also contact your health care provider.  After you use a few boxes of test strips, adjust (calibrate) your blood glucose meter by following instructions that came with your meter. This information is not intended to replace advice given to you by your health care provider. Make sure you discuss any questions you have with your health care provider. Document Released: 02/06/2003 Document Revised: 08/24/2015 Document Reviewed: 07/16/2015 Elsevier Interactive Patient Education  2017 Elsevier Inc.  

## 2016-04-16 NOTE — Progress Notes (Signed)
1. Pt is in the office today for headache. Pt states the pain is behind her eyes  2. Pt is in the office today for abdominal pain that is having stabbing pain 3. Pt is in the office today for boyels in her vagina area 4. Pt is in the office today for leg burning sensation 5. Pt is in the office today for left chest pain that only happens at night 5. Pt is in the office today for bleeding from the anal cavity

## 2016-04-16 NOTE — Progress Notes (Signed)
Marisa [name], is a 41 y.o. female  [id number]  [id number]  DOB - April 15, 1975  Chief Complaint  Patient presents with  . Abdominal Pain  . Chest Pain  . Headache       Subjective:   Marisa [name] is a 41 y.o. female with multiple medical history including type 2 diabetes mellitus on metformin, lumbar stenosis with bilateral lower Limb weakness now able to ambulate without restrictions, morbid obesity, major depression and anxiety, chronic migraine headache here today for a follow up visit. She has multiple complaints today. She complained of ongoing headache from her migraine, requesting refill of her medications. She also complained of boils around the vulva/vagina, painful, no fever, no discharge. She complained of rectal bleeding from her hemorrhoids. She request medications. She has epigastric burning pains, no nausea or vomiting, no diarrhea. No abdominal distension.  ALLERGIES: Allergies  Allergen Reactions  . Mushroom Extract Complex Anaphylaxis, Swelling and Other (See Comments)    Reaction:  Eye swelling  . Penicillins Anaphylaxis and Other (See Comments)    Has patient had a PCN reaction causing immediate rash, facial/tongue/throat swelling, SOB or lightheadedness with hypotension: Yes Has patient had a PCN reaction causing severe rash involving mucus membranes or skin necrosis: No Has patient had a PCN reaction that required hospitalization No Has patient had a PCN reaction occurring within the last 10 years: No If all of the above answers are "NO", then may proceed with Cephalosporin use.  Marland Kitchen Shellfish Allergy Anaphylaxis   PAST MEDICAL HISTORY: Past Medical History:  Diagnosis Date  . Anal pain    chronic  . Anemia   . Anxiety   . Asthma    exacerbation 02-28-2014 and 02-23-2014 secondary to Rhinovirus  . Chronic headaches   . Chronic low back pain   . Cyst of right ovary   . Difficult intravenous access    PER PT NEEDS PICC LINE  . Gait  instability   . History of adenomatous polyp of colon   . History of cardiac arrest    during SVD 1992  . History of ectopic pregnancy    2009-  S/P LEFT SALPINGECTOMY  . History of panic attacks   . IBS (irritable bowel syndrome)   . Lumbar stenosis L4 -- L5 with bulging disk   w/ right leg weakness/ decreased mobility  . Mild obstructive sleep apnea    study 03-20-2014  no cpap recommended  . Type 2 diabetes mellitus ([place])   . Weakness of right leg    FROM BACK PROBLEM PER PT   MEDICATIONS AT HOME: Prior to Admission medications   Medication Sig Start Date End Date Taking? Authorizing Provider  albuterol (PROVENTIL HFA;VENTOLIN HFA) 108 (90 BASE) MCG/ACT inhaler Inhale 1-2 puffs into the lungs every 6 (six) hours as needed for wheezing or shortness of breath. 02/23/14   Robbie Lis, MD  bacitracin-polymyxin b (POLYSPORIN) ophthalmic ointment Place 1 application into the left eye every 12 (twelve) hours. apply to eye every 12 hours while awake 04/16/16   Tresa Garter, MD  budesonide-formoterol (SYMBICORT) 160-4.5 MCG/ACT inhaler Inhale 2 puffs into the lungs 2 (two) times daily. 04/17/14   Kathee Delton, MD  hydrocortisone (ANUSOL-HC) 25 MG suppository Place 1 suppository (25 mg total) rectally 2 (two) times daily. 04/16/16   Tresa Garter, MD  ipratropium-albuterol (DUONEB) 0.5-2.5 (3) MG/3ML SOLN Take 3 mLs by nebulization every 6 (six) hours as needed (for wheezing/shortness of breath).    Historical Provider, MD  lithium carbonate (LITHOBID) 300 MG CR tablet Take 1 tablet (300 mg total) by mouth at bedtime. Patient not taking: Reported on 04/16/2016 08/29/14   Leonides Grills, MD  metFORMIN (GLUCOPHAGE) 500 MG tablet Take 1 tablet (500 mg total) by mouth 2 (two) times daily with a meal. 04/16/16   Tresa Garter, MD  ondansetron (ZOFRAN) 4 MG tablet Take 1 tablet (4 mg total) by mouth every 6 (six) hours. Patient not taking: Reported on 04/16/2016 02/02/16    Orpah Greek, MD  SUMAtriptan (IMITREX) 25 MG tablet Take 1 tablet (25 mg total) by mouth once. May repeat in 2 hours if headache persists or recurs. 04/16/16 04/16/16  Tresa Garter, MD  triamcinolone ointment (KENALOG) 0.5 % Apply 1 application topically 2 (two) times daily. 04/16/16   Tresa Garter, MD    Objective:   Vitals:   04/16/16 1556  BP: (!) 142/95  Pulse: 96  Resp: 16  Temp: 98.2 F (36.8 C)  TempSrc: Oral  SpO2: 97%  Weight: 272 lb (123.4 kg)   Exam General appearance : Awake, alert, not in any distress. Speech Clear. Not toxic looking HEENT: Atraumatic and Normocephalic, pupils equally reactive to light and accomodation Neck: Supple, no JVD. No cervical lymphadenopathy.  Chest: Good air entry bilaterally, no added sounds  CVS: S1 S2 regular, no murmurs.  Abdomen: Bowel sounds present, Non tender and not distended with no gaurding, rigidity or rebound. Extremities: B/L Lower Ext shows no edema, both legs are warm to touch Neurology: Awake alert, and oriented X 3, CN II-XII intact, Non focal Skin: No Rash Pelvic Exam: External genitalia normal, vagina normal without discharge and no mass or lump felt. No boil seen.   Data Review Lab Results  Component Value Date   HGBA1C 7.0 04/16/2016   HGBA1C 7.0 (H) 07/12/2015   HGBA1C 6.2 (H) 02/20/2014    Assessment & Plan   1. Well controlled type 2 diabetes mellitus ([place])  - Microalbumin/Creatinine Ratio, Urine - POCT A1C - Glucose (CBG) - metFORMIN (GLUCOPHAGE) 500 MG tablet; Take 1 tablet (500 mg total) by mouth 2 (two) times daily with a meal.  Dispense: 180 tablet; Refill: 3 - Ambulatory referral to Ophthalmology  2. Rectal bleeding  - hydrocortisone (ANUSOL-HC) 25 MG suppository; Place 1 suppository (25 mg total) rectally 2 (two) times daily.  Dispense: 30 suppository; Refill: 3 - Ambulatory referral to Gastroenterology  3. 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 May repeat in 2 hours if headache persists or recurs.  Dispense: 10 tablet; Refill: 0  4. Hordeolum externum of left lower eyelid  - bacitracin-polymyxin b (POLYSPORIN) ophthalmic ointment; Place 1 application into the left eye every 12 (twelve) hours. apply to eye every 12 hours while awake  Dispense: 3.5 g; Refill: 0  5. Rash  - triamcinolone ointment (KENALOG) 0.5 %; Apply 1 application topically 2 (two) times daily.  Dispense: 30 g; Refill: 0  Patient have been counseled extensively about nutrition and exercise. Other issues discussed during this visit include: low cholesterol diet, weight control and daily exercise, foot care, importance of adherence with medications and regular follow-up. We also discussed long term complications of uncontrolled diabetes and hypertension.   Return in about 6 months (around 10/14/2016) for Hemoglobin A1C and Follow up, DM, Follow up HTN, Follow up Pain and comorbidities.  The patient was given  clear instructions to go to ER or return to medical center if symptoms don't improve, worsen or new problems develop. The patient verbalized understanding. The patient was told to call to get lab results if they haven't heard anything in the next week.   This note has been created with Surveyor, quantity. Any transcriptional errors are unintentional.    Angelica Chessman, MD, [place], Karilyn Cota, [place] [facility] [place], [place]   04/16/2016, 4:29 PM

## 2016-04-17 ENCOUNTER — Encounter: Payer: Self-pay | Admitting: Gastroenterology

## 2016-04-17 ENCOUNTER — Encounter: Payer: Self-pay | Admitting: Internal Medicine

## 2016-04-27 ENCOUNTER — Emergency Department (HOSPITAL_COMMUNITY): Payer: Medicaid Other

## 2016-04-27 ENCOUNTER — Encounter (HOSPITAL_COMMUNITY): Payer: Self-pay | Admitting: *Deleted

## 2016-04-27 ENCOUNTER — Emergency Department (HOSPITAL_COMMUNITY)
Admission: EM | Admit: 2016-04-27 | Discharge: 2016-04-27 | Disposition: A | Discharge status: Home / Self Care | Payer: Medicaid Other | Attending: Emergency Medicine | Admitting: Emergency Medicine

## 2016-04-27 DIAGNOSIS — E119 Type 2 diabetes mellitus without complications: Secondary | ICD-10-CM | POA: Insufficient documentation

## 2016-04-27 DIAGNOSIS — Z79899 Other long term (current) drug therapy: Secondary | ICD-10-CM | POA: Insufficient documentation

## 2016-04-27 DIAGNOSIS — R1031 Right lower quadrant pain: Secondary | ICD-10-CM

## 2016-04-27 DIAGNOSIS — Z87891 Personal history of nicotine dependence: Secondary | ICD-10-CM | POA: Diagnosis not present

## 2016-04-27 DIAGNOSIS — Z7984 Long term (current) use of oral hypoglycemic drugs: Secondary | ICD-10-CM | POA: Insufficient documentation

## 2016-04-27 DIAGNOSIS — J45909 Unspecified asthma, uncomplicated: Secondary | ICD-10-CM | POA: Diagnosis not present

## 2016-04-27 LAB — COMPREHENSIVE METABOLIC PANEL
ALT: 78 U/L — ABNORMAL HIGH (ref 14–54)
AST: 55 U/L — ABNORMAL HIGH (ref 15–41)
Albumin: 3.4 g/dL — ABNORMAL LOW (ref 3.5–5.0)
Alkaline Phosphatase: 75 U/L (ref 38–126)
Anion gap: 15 (ref 5–15)
BUN: 9 mg/dL (ref 6–20)
CO2: 19 mmol/L — ABNORMAL LOW (ref 22–32)
Calcium: 8.7 mg/dL — ABNORMAL LOW (ref 8.9–10.3)
Chloride: 103 mmol/L (ref 101–111)
Creatinine, Ser: 0.76 mg/dL (ref 0.44–1.00)
GFR calc Af Amer: >60 mL/min (ref >60)
GFR calc non Af Amer: >60 mL/min (ref >60)
Glucose, Bld: 149 mg/dL — ABNORMAL HIGH (ref 65–99)
Potassium: 3.8 mmol/L (ref 3.5–5.1)
Sodium: 137 mmol/L (ref 135–145)
Total Bilirubin: 0.3 mg/dL (ref 0.3–1.2)
Total Protein: 7 g/dL (ref 6.5–8.1)

## 2016-04-27 LAB — CBC
HCT: 41.5 % (ref 36.0–46.0)
Hemoglobin: 13.9 g/dL (ref 12.0–15.0)
MCH: 32.6 pg (ref 26.0–34.0)
MCHC: 33.5 g/dL (ref 30.0–36.0)
MCV: 97.2 fL (ref 78.0–100.0)
Platelets: 403 10*3/uL — ABNORMAL HIGH (ref 150–400)
RBC: 4.27 MIL/uL (ref 3.87–5.11)
RDW: 12.8 % (ref 11.5–15.5)
WBC: 9.5 10*3/uL (ref 4.0–10.5)

## 2016-04-27 LAB — I-STAT TROPONIN, ED: Troponin i, poc: 0 ng/mL (ref 0.00–0.08)

## 2016-04-27 LAB — LIPASE, BLOOD: Lipase: 28 U/L (ref 11–51)

## 2016-04-27 MED ORDER — IOPAMIDOL (ISOVUE-300) INJECTION 61%
INTRAVENOUS | Status: AC
  Administered 2016-04-27: 100 mL
  Filled 2016-04-27: qty 100

## 2016-04-27 MED ORDER — SODIUM CHLORIDE 0.9 % IV SOLN
INTRAVENOUS | Status: DC
  Administered 2016-04-27: 08:00:00 via INTRAVENOUS

## 2016-04-27 MED ORDER — ONDANSETRON HCL 4 MG/2ML IJ SOLN
4.0000 mg | Freq: Once | INTRAMUSCULAR | Status: AC
  Administered 2016-04-27: 4 mg via INTRAVENOUS
  Filled 2016-04-27: qty 2

## 2016-04-27 MED ORDER — HYDROMORPHONE HCL 2 MG/ML IJ SOLN
1.0000 mg | Freq: Once | INTRAMUSCULAR | Status: AC
  Administered 2016-04-27: 1 mg via INTRAVENOUS
  Filled 2016-04-27: qty 1

## 2016-04-27 MED ORDER — PROMETHAZINE HCL 25 MG PO TABS: 25.0000 mg | ORAL_TABLET | Freq: Four times a day (QID) | ORAL | 1 refills | Status: DC | PRN

## 2016-04-27 MED ORDER — SODIUM CHLORIDE 0.9 % IV BOLUS (SEPSIS)
500.0000 mL | Freq: Once | INTRAVENOUS | Status: AC
  Administered 2016-04-27: 500 mL via INTRAVENOUS

## 2016-04-27 MED ORDER — HYDROCODONE-ACETAMINOPHEN 5-325 MG PO TABS: 1.0000 | ORAL_TABLET | Freq: Four times a day (QID) | ORAL | 0 refills | Status: DC | PRN

## 2016-04-27 MED ORDER — MORPHINE SULFATE (PF) 4 MG/ML IV SOLN
4.0000 mg | Freq: Once | INTRAVENOUS | Status: AC
  Administered 2016-04-27: 4 mg via INTRAVENOUS
  Filled 2016-04-27: qty 1

## 2016-04-27 NOTE — ED Triage Notes (Signed)
Pt from home via [facility]. C/o RLQ abd pain x1 wk that increased in severity today. Endorses n/v. 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 EMS gave pt 15mcg of Fentanyl w/ no relief. A&O x4 on arrival.

## 2016-04-27 NOTE — ED Notes (Addendum)
Pt returned from CT scan complaining of pain in RLQ as well as IV "burning" sts " they did something over in ct to my iv and it is burning now." Carlis Abbott, RN notified

## 2016-04-27 NOTE — ED Provider Notes (Signed)
[facility] DEPT Provider Note   CSN: [id number] Arrival date & time: 04/27/16  0533     History   Chief Complaint Chief Complaint  Patient presents with  . Abdominal Pain    HPI Marisa [name] is a 41 y.o. female.  The patient will week history of right lower quadrant abdominal pain or get worse in the last 2 days. Associated with nausea and vomiting no fevers no diarrhea. Patient did have 3 loose bowel movements today no vomiting today. Pain is 10 out of 10. Nonradiating. Patient has a history of irritable bowel syndrome. Patient's had a hysterectomy in the past. Patient's had her gallbladder removed. Patient is concerned about having appendicitis.      Past Medical History:  Diagnosis Date  . Anal pain    chronic  . Anemia   . Anxiety   . Asthma    exacerbation 02-28-2014 and 02-23-2014 secondary to Rhinovirus  . Chronic headaches   . Chronic low back pain   . Cyst of right ovary   . Difficult intravenous access    PER PT NEEDS PICC LINE  . Gait instability   . History of adenomatous polyp of colon   . History of cardiac arrest    during SVD 1992  . History of ectopic pregnancy    2009-  S/P LEFT SALPINGECTOMY  . History of panic attacks   . IBS (irritable bowel syndrome)   . Lumbar stenosis L4 -- L5 with bulging disk   w/ right leg weakness/ decreased mobility  . Mild obstructive sleep apnea    study 03-20-2014  no cpap recommended  . Type 2 diabetes mellitus ([place])   . Weakness of right leg    FROM BACK PROBLEM PER PT    Patient Active Problem List   Diagnosis Date Noted  . Carbuncle of labium 07/12/2015  . Rash, skin 07/12/2015  . Dandruff 03/26/2015  . Nipple discharge in female 03/26/2015  . Migraine variant with headache 05/09/2014  . Unable to ambulate 05/09/2014  . Severe recurrent major depressive disorder with psychotic features ([place]) 05/04/2014  . GAD (generalized anxiety disorder) 05/04/2014  . Panic disorder with agoraphobia  05/04/2014  . Social anxiety disorder 05/04/2014  . PTSD (post-traumatic stress disorder) 05/04/2014  . Cigarette nicotine dependence without complication 94/70/9628  . Gait disturbance 04/11/2014  . Depression 03/27/2014  . Falls 03/27/2014  . OSA (obstructive sleep apnea) 03/06/2014  . Insomnia 03/06/2014  . Anxiety   . History of cardiac arrest   . Acute bronchitis   . Asthma exacerbation 02/20/2014  . Well controlled type 2 diabetes mellitus ([place]) 02/20/2014  . Sore throat 02/20/2014  . Chest pain 02/20/2014  . Tachycardia 02/20/2014  . Diabetes mellitus without complication ([place]) 36/62/9476  . Pelvic pain 07/25/2013  . Rectal bleeding 07/25/2013  . Persistent vomiting 04/27/2013  . Rectal bleed 04/26/2013  . Atypical chest pain 04/26/2013  . Abdominal pain 04/26/2013  . S/P Total vaginal hysterectomy on 01/06/13 01/06/2013  . Dyspnea 09/07/2012  . 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  . Anemia 07/30/2012  . Current smoker 07/30/2012    Past Surgical History:  Procedure Laterality Date  . ABDOMINAL HYSTERECTOMY    . COLONOSCOPY Left 04/29/2013   Procedure: COLONOSCOPY;  Surgeon: Arta Silence, MD;  Location: [facility];  Service: Endoscopy;  Laterality: Left;  . EVALUATION UNDER ANESTHESIA WITH FISTULECTOMY N/A 04/20/2014   Procedure: EXAM UNDER ANESTHESIA ;  Surgeon: Leighton Ruff, MD;  Location: [facility];  Service: General;  Laterality: N/A;  . FLEXIBLE SIGMOIDOSCOPY N/A 11/09/2013   Procedure: FLEXIBLE SIGMOIDOSCOPY;  Surgeon: Arta Silence, MD;  Location: [facility];  Service: Endoscopy;  Laterality: N/A;  . LAPAROSCOPIC CHOLECYSTECTOMY  2005  . SPHINCTEROTOMY N/A 04/20/2014   Procedure:  LATERAL INTERNAL SPHINCTEROTOMY;  Surgeon: Leighton Ruff, MD;  Location[facility];  Service: General;  Laterality: N/A;  . TRANSTHORACIC ECHOCARDIOGRAM  12-30-2012   mild LVH/  ef 55-60%  . UNILATERAL SALPINGECTOMY  2009   laparotomy left  salpingectomy-- ectopic preg.  Marland Kitchen VAGINAL HYSTERECTOMY N/A 01/06/2013   Procedure: HYSTERECTOMY VAGINAL;  Surgeon: Osborne Oman, MD;  Location: [facility];  Service: Gynecology;  Laterality: N/A;    OB History    Gravida Para Term Preterm AB Living   _0 SAB TAB Ectopic Multiple Live Births     1 1           Home Medications    Prior to Admission medications   Medication Sig Start Date End Date Taking? Authorizing Provider  albuterol (PROVENTIL HFA;VENTOLIN HFA) 108 (90 BASE) MCG/ACT inhaler Inhale 1-2 puffs into the lungs every 6 (six) hours as needed for wheezing or shortness of breath. 02/23/14   Robbie Lis, MD  bacitracin-polymyxin b (POLYSPORIN) ophthalmic ointment Place 1 application into the left eye every 12 (twelve) hours. apply to eye every 12 hours while awake 04/16/16   Tresa Garter, MD  Blood Glucose Monitoring Suppl (ACCU-CHEK AVIVA PLUS) w/Device KIT 1 each by Does not apply route 3 (three) times daily. 04/16/16   Tresa Garter, MD  budesonide-formoterol (SYMBICORT) 160-4.5 MCG/ACT inhaler Inhale 2 puffs into the lungs 2 (two) times daily. 04/17/14   Kathee Delton, MD  glucose blood (ACCU-CHEK AVIVA) test strip Use as instructed 04/16/16   Tresa Garter, MD  HYDROcodone-acetaminophen (NORCO/VICODIN) 5-325 MG tablet Take 1-2 tablets by mouth every 6 (six) hours as needed for moderate pain. 04/27/16   Fredia Sorrow, MD  hydrocortisone (ANUSOL-HC) 25 MG suppository Place 1 suppository (25 mg total) rectally 2 (two) times daily. 04/16/16   Tresa Garter, MD  ipratropium-albuterol (DUONEB) 0.5-2.5 (3) MG/3ML SOLN Take 3 mLs by nebulization every 6 (six) hours as needed (for wheezing/shortness of breath).    Historical Provider, MD  Lancet Devices (ACCU-CHEK [facility]) lancets Use as instructed 04/16/16   Tresa Garter, MD  lithium carbonate (LITHOBID) 300 MG CR tablet Take 1 tablet (300 mg total) by mouth at bedtime. Patient not taking:  Reported on 04/16/2016 08/29/14   Leonides Grills, MD  metFORMIN (GLUCOPHAGE) 500 MG tablet Take 1 tablet (500 mg total) by mouth 2 (two) times daily with a meal. 04/16/16   Tresa Garter, MD  ondansetron (ZOFRAN) 4 MG tablet Take 1 tablet (4 mg total) by mouth every 6 (six) hours. Patient not taking: Reported on 04/16/2016 02/02/16   Orpah Greek, MD  promethazine (PHENERGAN) 25 MG tablet Take 1 tablet (25 mg total) by mouth every 6 (six) hours as needed for nausea or vomiting. 04/27/16   Fredia Sorrow, MD  SUMAtriptan (IMITREX) 25 MG tablet Take 1 tablet (25 mg total) by mouth once. May repeat in 2 hours if headache persists or recurs. 04/16/16 04/16/16  Tresa Garter, MD  triamcinolone ointment (KENALOG) 0.5 % Apply 1 application topically 2 (two) times daily. 04/16/16   Tresa Garter, MD  TRUEPLUS LANCETS 28G MISC 1 each by Does not apply route 3 (  three) times daily. 04/16/16   Tresa Garter, MD    Family History Family History  Problem Relation Age of Onset  . Hypertension Mother   . Diabetes Mother   . Allergies Mother   . Heart disease Mother   . Cancer Father   . Hyperlipidemia Father   . Hypertension Father   . Heart disease Maternal Grandmother   . Schizophrenia Sister   . Bipolar disorder Sister   . Bipolar disorder Brother   . Bipolar disorder Sister     Social History Social History  Substance Use Topics  . Smoking status: Former Smoker    Packs/day: 0.20    Years: 11.00    Types: Cigarettes    Quit date: 11/17/2013  . Smokeless tobacco: Never Used  . Alcohol use No     Allergies   Mushroom extract complex; Penicillins; and Shellfish allergy   Review of Systems Review of Systems  Constitutional: Negative for fever.  HENT: Negative for congestion.   Eyes: Negative for redness.  Respiratory: Negative for shortness of breath.   Cardiovascular: Negative for chest pain.  Gastrointestinal: Positive for abdominal pain, nausea and  vomiting.  Genitourinary: Negative for dysuria.  Musculoskeletal: Negative for back pain.  Skin: Negative for rash.  Neurological: Negative for headaches.  Hematological: Does not bruise/bleed easily.  Psychiatric/Behavioral: Negative for confusion.     Physical Exam Updated Vital Signs BP (!) 94/53 (BP Location: Right Arm)   Pulse 110   Temp 97.4 F (36.3 C) (Oral)   Resp 18   Ht _0  (1.651 m)   Wt 123.4 kg   LMP 11/27/2012   SpO2 96%   BMI 45.26 kg/m   Physical Exam  Constitutional: She is oriented to person, place, and time. She appears well-developed and well-nourished. No distress.  HENT:  Head: Normocephalic and atraumatic.  Mouth/Throat: Oropharynx is clear and moist.  Eyes: EOM are normal. Pupils are equal, round, and reactive to light.  Neck: Normal range of motion. Neck supple.  Cardiovascular: Normal rate, regular rhythm and normal heart sounds.   Pulmonary/Chest: Effort normal and breath sounds normal. No respiratory distress.  Abdominal: Soft. Bowel sounds are normal. There is tenderness.  Not tender to palpation right lower quadrant. No guarding.  Musculoskeletal: Normal range of motion.  Neurological: She is alert and oriented to person, place, and time. No cranial nerve deficit or sensory deficit. She exhibits normal muscle tone. Coordination normal.  Skin: Skin is warm.  Nursing note and vitals reviewed.    ED Treatments / Results  Labs (all labs ordered are listed, but only abnormal results are displayed) Labs Reviewed  COMPREHENSIVE METABOLIC PANEL - Abnormal; Notable for the following:       Result Value   CO2 19 (*)    Glucose, Bld 149 (*)    Calcium 8.7 (*)    Albumin 3.4 (*)    AST 55 (*)    ALT 78 (*)    All other components within normal limits  CBC - Abnormal; Notable for the following:    Platelets 403 (*)    All other components within normal limits  LIPASE, BLOOD  URINALYSIS, ROUTINE W REFLEX MICROSCOPIC  I-STAT TROPOININ, ED      EKG  EKG Interpretation  Date/Time:  Sunday April 27 2016 06:02:01 EDT Ventricular Rate:  109 PR Interval:  138 QRS Duration: 74 QT Interval:  340 QTC Calculation: 457 R Axis:   13 Text Interpretation:  Sinus tachycardia Possible Inferior infarct ,  age undetermined Cannot rule out Anterior infarct , age undetermined Abnormal ECG No significant change since last tracing Confirmed by Diamon Reddinger  MD, Makaleigh Reinard [phone]) on 04/27/2016 7:16:51 AM       Radiology Dg Chest 2 View  Result Date: 04/27/2016 CLINICAL DATA:  Initial evaluation for acute left-sided abdominal pain for 1 week. EXAM: CHEST  2 VIEW COMPARISON:  Prior radiograph from 02/26/2016. FINDINGS: Cardiac and mediastinal silhouettes are stable in size and contour, and remain within normal limits. Lungs are hypoinflated. Mild diffuse bronchovascular crowding. No focal infiltrates. No pulmonary edema or pleural effusion. No pneumothorax. No acute osseus abnormality. IMPRESSION: 1. Shallow lung inflation with secondary mild diffuse bronchovascular crowding. 2. No other active cardiopulmonary disease identified. Electronically Signed   By: Jeannine Boga M.D.   On: 04/27/2016 06:44   Ct Abdomen Pelvis W Contrast  Result Date: 04/27/2016 CLINICAL DATA:  Right lower quadrant pain.  Shooting pain. EXAM: CT ABDOMEN AND PELVIS WITH CONTRAST TECHNIQUE: Multidetector CT imaging of the abdomen and pelvis was performed using the standard protocol following bolus administration of intravenous contrast. CONTRAST:  143m ISOVUE-300 IOPAMIDOL (ISOVUE-300) INJECTION 61% COMPARISON:  The 02/22/2015 FINDINGS: Lower chest: No acute abnormality. Hepatobiliary: No focal liver abnormality is seen. Diffuse low attenuation of the liver as can be seen with hepatic steatosis. Status post cholecystectomy. No biliary dilatation. Pancreas: Unremarkable. No pancreatic ductal dilatation or surrounding inflammatory changes. Spleen: Normal in size without focal  abnormality. Adrenals/Urinary Tract: Adrenal glands are unremarkable. Kidneys are normal, without renal calculi, focal lesion, or hydronephrosis. Bladder is unremarkable. Stomach/Bowel: Stomach is within normal limits. Appendix appears normal. No evidence of bowel wall thickening, distention, or inflammatory changes. No pneumatosis, pneumoperitoneum or portal venous gas. Vascular/Lymphatic: Normal caliber abdominal aorta. No lymphadenopathy. Reproductive: Status post hysterectomy. No adnexal masses. Bilateral ovarian follicles. Other: No fluid collection or hematoma.  No abdominal wall hernia. Musculoskeletal: No acute osseous abnormality. No lytic or sclerotic osseous lesion. Mild osteoarthritis of bilateral sacroiliac joints. IMPRESSION: 1. No acute abdominal or pelvic pathology. 2. Normal appendix. 3. Hepatic steatosis. Electronically Signed   By: HKathreen Devoid  On: 04/27/2016 09:48    Procedures Procedures (including critical care time)  Medications Ordered in ED Medications  0.9 %  sodium chloride infusion ( Intravenous New Bag/Given 04/27/16 0739)  morphine 4 MG/ML injection 4 mg (4 mg Intravenous Given 04/27/16 0637)  sodium chloride 0.9 % bolus 500 mL (0 mLs Intravenous Stopped 04/27/16 0809)  ondansetron (ZOFRAN) injection 4 mg (4 mg Intravenous Given 04/27/16 0738)  HYDROmorphone (DILAUDID) injection 1 mg (1 mg Intravenous Given 04/27/16 0738)  iopamidol (ISOVUE-300) 61 % injection (100 mLs  Contrast Given 04/27/16 0854)  HYDROmorphone (DILAUDID) injection 1 mg (1 mg Intravenous Given 04/27/16 0939)     Initial Impression / Assessment and Plan / ED Course  I have reviewed the triage vital signs and the nursing notes.  Pertinent labs & imaging results that were available during my care of the patient were reviewed by me and considered in my medical decision making (see chart for details).     Workup for the right lower quadrant abdominal pain without any acute findings. Labs without  significant abnormalities. CT scan negative. Patient's had a previous hysterectomy. On CT scan does appear that both ovaries are present. Patient improved here with pain medicine. Will treat symptomatically with pain medicine and antinausea medicine have her follow-up with her primary care doctor. Patient also has a history of irritable bowel syndrome.  Final Clinical Impressions(s) /  ED Diagnoses   Final diagnoses:  Right lower quadrant abdominal pain    New Prescriptions New Prescriptions   HYDROCODONE-ACETAMINOPHEN (NORCO/VICODIN) 5-325 MG TABLET    Take 1-2 tablets by mouth every 6 (six) hours as needed for moderate pain.   PROMETHAZINE (PHENERGAN) 25 MG TABLET    Take 1 tablet (25 mg total) by mouth every 6 (six) hours as needed for nausea or vomiting.     Fredia Sorrow, MD 04/27/16 [phone]

## 2016-04-27 NOTE — ED Notes (Signed)
Patient transported to X-ray 

## 2016-04-27 NOTE — Discharge Instructions (Signed)
Workup here today without any acute findings in the abdomen. Take pain medicine as needed for the abdominal pain and phenergan as needed for nausea and vomiting. Make an appointment to follow-up with your regular doctor. Return for any new or worse symptoms.

## 2016-04-30 LAB — HM DIABETES EYE EXAM

## 2016-05-03 LAB — MICROALBUMIN / CREATININE URINE RATIO

## 2016-05-14 ENCOUNTER — Ambulatory Visit (INDEPENDENT_AMBULATORY_CARE_PROVIDER_SITE_OTHER): Payer: Medicaid Other | Admitting: Gastroenterology

## 2016-05-14 ENCOUNTER — Encounter: Payer: Self-pay | Admitting: Gastroenterology

## 2016-05-14 VITALS — BP 116/78 | HR 72 | Ht 65.0 in | Wt 267.4 lb

## 2016-05-14 DIAGNOSIS — R197 Diarrhea, unspecified: Secondary | ICD-10-CM | POA: Diagnosis not present

## 2016-05-14 DIAGNOSIS — R1031 Right lower quadrant pain: Secondary | ICD-10-CM | POA: Diagnosis not present

## 2016-05-14 DIAGNOSIS — K625 Hemorrhage of anus and rectum: Secondary | ICD-10-CM | POA: Diagnosis not present

## 2016-05-14 MED ORDER — DICYCLOMINE HCL 10 MG PO CAPS: 10.0000 mg | ORAL_CAPSULE | Freq: Three times a day (TID) | ORAL | 0 refills | Status: DC

## 2016-05-14 NOTE — Progress Notes (Signed)
[facility] Consult Note:  History: Marisa [name] 05/14/2016  Referring physician: Angelica Chessman, MD  Reason for consult/chief complaint: Rectal Bleeding (Dark and BRB on tissue and in toilet times 3 years, cramping and pain with diarrhea)   Subjective  HPI:  This is a 41 year old woman referred by primary care clinic at most, for rectal bleeding and abdominal pain. Limited records are available from when she saw Dr. Paulita Fujita with the [facility] in March 2015. At that time she had rectal bleeding abdominal pain during hospital admission. A colonoscopy revealed a subcentimeter tubular adenoma and internal hemorrhoids. She continued to have some bleeding and then underwent sigmoidoscopy later that year where hemorrhoids were again seen and there was possibility of an anal fissure. It seems that she underwent lateral sphincterotomy for treatment of the fissure with Dr. Marcello Moores of colorectal surgery in 2016. She says this seemed to help the bleeding for some period of time but then it returned. She is passing both black and red stool at times. She also has some right-sided abdominal pain over the last 2 months. It might be worse with bowel movements and relieves somewhat afterwards. She also tends toward loose stools for the last several years. She often needs to have a postprandial bowel movement with some relief of abdominal pain. She was in the ED earlier this month for the abdominal pain and diarrhea, CT scan with contrast was normal. Chronic diarrhea, PPBMs   ROS:  Review of Systems  Constitutional: Negative for appetite change and unexpected weight change.  HENT: Negative for mouth sores and voice change.   Eyes: Negative for pain and redness.  Respiratory: Negative for cough and shortness of breath.   Cardiovascular: Negative for chest pain and palpitations.  Genitourinary: Negative for dysuria and hematuria.  Musculoskeletal: Negative for arthralgias  and myalgias.  Skin: Negative for pallor and rash.  Neurological: Negative for weakness and headaches.  Hematological: Negative for adenopathy.  Psychiatric/Behavioral: Positive for dysphoric mood. The patient is nervous/anxious.      Past Medical History: Past Medical History:  Diagnosis Date  . Anal pain    chronic  . Anemia   . Anxiety   . Asthma    exacerbation 02-28-2014 and 02-23-2014 secondary to Rhinovirus  . Chronic headaches   . Chronic low back pain   . Cyst of right ovary   . Difficult intravenous access    PER PT NEEDS PICC LINE  . Gait instability   . History of adenomatous polyp of colon   . History of cardiac arrest    during SVD 1992  . History of ectopic pregnancy    2009-  S/P LEFT SALPINGECTOMY  . History of panic attacks   . IBS (irritable bowel syndrome)   . Lumbar stenosis L4 -- L5 with bulging disk   w/ right leg weakness/ decreased mobility  . Mild obstructive sleep apnea    study 03-20-2014  no cpap recommended  . Type 2 diabetes mellitus ([place])   . Weakness of right leg    FROM BACK PROBLEM PER PT     Past Surgical History: Past Surgical History:  Procedure Laterality Date  . ABDOMINAL HYSTERECTOMY    . COLONOSCOPY Left 04/29/2013   Procedure: COLONOSCOPY;  Surgeon: Arta Silence, MD;  Location: [facility];  Service: Endoscopy;  Laterality: Left;  . EVALUATION UNDER ANESTHESIA WITH FISTULECTOMY N/A 04/20/2014   Procedure: EXAM UNDER ANESTHESIA ;  Surgeon: Leighton Ruff, MD;  Location: [facility];  Service: General;  Laterality: N/A;  . FLEXIBLE SIGMOIDOSCOPY N/A 11/09/2013   Procedure: FLEXIBLE SIGMOIDOSCOPY;  Surgeon: Arta Silence, MD;  Location: [facility];  Service: Endoscopy;  Laterality: N/A;  . LAPAROSCOPIC CHOLECYSTECTOMY  2005  . SPHINCTEROTOMY N/A 04/20/2014   Procedure:  LATERAL INTERNAL SPHINCTEROTOMY;  Surgeon: Leighton Ruff, MD;  Location: [facility];  Service: General;  Laterality: N/A;  .  TRANSTHORACIC ECHOCARDIOGRAM  12-30-2012   mild LVH/  ef 55-60%  . UNILATERAL SALPINGECTOMY  2009   laparotomy left salpingectomy-- ectopic preg.  Marland Kitchen VAGINAL HYSTERECTOMY N/A 01/06/2013   Procedure: HYSTERECTOMY VAGINAL;  Surgeon: Osborne Oman, MD;  Location: [facility];  Service: Gynecology;  Laterality: N/A;     Family History: Family History  Problem Relation Age of Onset  . Hypertension Mother   . Diabetes Mother   . Allergies Mother   . Heart disease Mother   . Clotting disorder Mother   . Cancer Father   . Hyperlipidemia Father   . Hypertension Father   . Colon cancer Father   . Heart disease Maternal Grandmother   . Schizophrenia Sister   . Bipolar disorder Sister   . Clotting disorder Sister   . Bipolar disorder Brother   . Kidney disease Brother   . Bipolar disorder Sister   . Pancreatic cancer Maternal Aunt   . Prostate cancer Maternal Uncle   . Liver cancer Maternal Grandfather   . Liver cancer Paternal Grandfather     Social History: Social History   Social History  . Marital status: Widowed    Spouse name: N/A  . Number of children: 1  . Years of education: N/A   Occupational History  . [employer] engineer   Social History Main Topics  . Smoking status: Former Smoker    Packs/day: 0.20    Years: 11.00    Types: Cigarettes    Quit date: 11/17/2013  . Smokeless tobacco: Never Used  . Alcohol use No  . Drug use: No  . Sexual activity: No   Other Topics Concern  . None   Social History Narrative   Lives with sister   Drinks no caffeine    Allergies: Allergies  Allergen Reactions  . Mushroom Extract Complex Anaphylaxis, Swelling and Other (See Comments)    Reaction:  Eye swelling  . Penicillins Anaphylaxis and Other (See Comments)    Has patient had a PCN reaction causing immediate rash, facial/tongue/throat swelling, SOB or lightheadedness with hypotension: Yes Has patient had a PCN reaction causing severe rash involving mucus membranes or  skin necrosis: No Has patient had a PCN reaction that required hospitalization No Has patient had a PCN reaction occurring within the last 10 years: No If all of the above answers are "NO", then may proceed with Cephalosporin use.  Marland Kitchen Shellfish Allergy Anaphylaxis    Outpatient Meds: Current Outpatient Prescriptions  Medication Sig Dispense Refill  . albuterol (PROVENTIL HFA;VENTOLIN HFA) 108 (90 BASE) MCG/ACT inhaler Inhale 1-2 puffs into the lungs every 6 (six) hours as needed for wheezing or shortness of breath. 1 Inhaler 1  . bacitracin-polymyxin b (POLYSPORIN) ophthalmic ointment Place 1 application into the left eye every 12 (twelve) hours. apply to eye every 12 hours while awake 3.5 g 0  . Blood Glucose Monitoring Suppl (ACCU-CHEK AVIVA PLUS) w/Device KIT 1 each by Does not apply route 3 (three) times daily. 1 kit 0  . budesonide-formoterol (SYMBICORT) 160-4.5 MCG/ACT inhaler Inhale 2 puffs into the lungs 2 (two) times  daily. 1 Inhaler 12  . glucose blood (ACCU-CHEK AVIVA) test strip Use as instructed 100 each 12  . hydrocortisone (ANUSOL-HC) 25 MG suppository Place 1 suppository (25 mg total) rectally 2 (two) times daily. 30 suppository 3  . ipratropium-albuterol (DUONEB) 0.5-2.5 (3) MG/3ML SOLN Take 3 mLs by nebulization every 6 (six) hours as needed (for wheezing/shortness of breath).    Elmore Guise Devices (ACCU-CHEK SOFTCLIX) lancets Use as instructed 1 each 12  . lithium carbonate (LITHOBID) 300 MG CR tablet Take 1 tablet (300 mg total) by mouth at bedtime. 30 tablet 1  . metFORMIN (GLUCOPHAGE) 500 MG tablet Take 1 tablet (500 mg total) by mouth 2 (two) times daily with a meal. 180 tablet 3  . ondansetron (ZOFRAN) 4 MG tablet Take 1 tablet (4 mg total) by mouth every 6 (six) hours. 12 tablet 0  . triamcinolone ointment (KENALOG) 0.5 % Apply 1 application topically 2 (two) times daily. 30 g 0  . TRUEPLUS LANCETS 28G MISC 1 each by Does not apply route 3 (three) times daily. 100 each 12    . dicyclomine (BENTYL) 10 MG capsule Take 1 capsule (10 mg total) by mouth 3 (three) times daily before meals. 90 capsule 0  . SUMAtriptan (IMITREX) 25 MG tablet Take 1 tablet (25 mg total) by mouth once. May repeat in 2 hours if headache persists or recurs. 10 tablet 0   No current facility-administered medications for this visit.       ___________________________________________________________________ Objective   Exam:  BP 116/78   Pulse 72   Ht '5\' 5"'  (1.651 m)   Wt 267 lb 6.4 oz (121.3 kg)   LMP 11/27/2012   BMI 44.50 kg/m    General: this is a(n) Obese woman in no acute distress   Eyes: sclera anicteric, no redness  ENT: oral mucosa moist without lesions, no cervical or supraclavicular lymphadenopathy, good dentition  CV: RRR without murmur, S1/S2, no JVD, no peripheral edema  Resp: clear to auscultation bilaterally, normal RR and effort noted  GI: soft, tenderness throughout the right hemiabdomen, with active bowel sounds. No guarding or palpable organomegaly noted. No hernia noted  Skin; warm and dry, no rash or jaundice noted  Neuro: awake, alert and oriented x 3. Normal gross motor function and fluent speech Rectal exam, chaperoned by our MA Magda Paganini: The distal remnant of what was a previous posterior anal fissure is seen. It is not tender and there is no bleeding. The digital exam reveals some fibrosis posteriorly that is also probably from her previous fissure, there is no tenderness, no palpable internal lesions are discovered. Labs:  CBC Latest Ref Rng & Units 04/27/2016 02/26/2016 02/02/2016  WBC 4.0 - 10.5 K/uL 9.5 9.5 10.2  Hemoglobin 12.0 - 15.0 g/dL 13.9 13.9 14.7  Hematocrit 36.0 - 46.0 % 41.5 41.7 43.6  Platelets 150 - 400 K/uL 403(H) 322 387   CMP Latest Ref Rng & Units 04/27/2016 02/26/2016 02/02/2016  Glucose 65 - 99 mg/dL 149(H) 158(H) 130(H)  BUN 6 - 20 mg/dL 9 <5(L) 9  Creatinine 0.44 - 1.00 mg/dL 0.76 0.61 0.71  Sodium 135 - 145 mmol/L 137 140  141  Potassium 3.5 - 5.1 mmol/L 3.8 3.4(L) 3.6  Chloride 101 - 111 mmol/L 103 107 106  CO2 22 - 32 mmol/L 19(L) 22 23  Calcium 8.9 - 10.3 mg/dL 8.7(L) 8.6(L) 8.9  Total Protein 6.5 - 8.1 g/dL 7.0 6.6 7.2  Total Bilirubin 0.3 - 1.2 mg/dL 0.3 0.3 0.4  Alkaline  Phos 38 - 126 U/L 75 63 71  AST 15 - 41 U/L 55(H) 63(H) 83(H)  ALT 14 - 54 U/L 78(H) 113(H) 148(H)     Radiologic Studies:  Recent CTAP nml  Assessment: Encounter Diagnoses  Name Primary?  . Rectal bleeding Yes  . Diarrhea, unspecified type   . RLQ abdominal pain     She probably has benign anorectal bleeding I think she has IBS  Plan:  Trial of dicyclomine Colonoscopy to rule out other sources of bleeding. We will have to do to the hospital since she says there is great difficulty placing a peripheral IV in her.  Thank you for the courtesy of this consult.  Please call me with any questions or concerns.  [name]  CC: Angelica Chessman, MD

## 2016-05-14 NOTE — Patient Instructions (Addendum)
If you are age 42 or older, your body mass index should be between 23-30. Your Body mass index is 44.5 kg/m. If this is out of the aforementioned range listed, please consider follow up with your Primary Care Provider.  If you are age 6 or younger, your body mass index should be between 19-25. Your Body mass index is 44.5 kg/m. If this is out of the aformentioned range listed, please consider follow up with your Primary Care Provider.   We have sent the following medications to your pharmacy for you to pick up at your convenience: Bentyl  We will contact you in a few months to schedule a colonoscopy at the hospital.  Thank you for choosing [facility]  Dr Wilfrid Lund III

## 2016-06-04 ENCOUNTER — Ambulatory Visit: Payer: Medicaid Other | Attending: Internal Medicine | Admitting: Internal Medicine

## 2016-06-04 ENCOUNTER — Encounter: Payer: Self-pay | Admitting: Internal Medicine

## 2016-06-04 VITALS — BP 121/86 | HR 104 | Temp 98.0°F | Resp 18 | Ht 65.0 in | Wt 266.0 lb

## 2016-06-04 DIAGNOSIS — F419 Anxiety disorder, unspecified: Secondary | ICD-10-CM | POA: Diagnosis not present

## 2016-06-04 DIAGNOSIS — R21 Rash and other nonspecific skin eruption: Secondary | ICD-10-CM

## 2016-06-04 DIAGNOSIS — G8929 Other chronic pain: Secondary | ICD-10-CM | POA: Insufficient documentation

## 2016-06-04 DIAGNOSIS — E1140 Type 2 diabetes mellitus with diabetic neuropathy, unspecified: Secondary | ICD-10-CM | POA: Insufficient documentation

## 2016-06-04 DIAGNOSIS — G5793 Unspecified mononeuropathy of bilateral lower limbs: Secondary | ICD-10-CM

## 2016-06-04 DIAGNOSIS — N83201 Unspecified ovarian cyst, right side: Secondary | ICD-10-CM | POA: Diagnosis not present

## 2016-06-04 DIAGNOSIS — J45909 Unspecified asthma, uncomplicated: Secondary | ICD-10-CM | POA: Diagnosis not present

## 2016-06-04 DIAGNOSIS — E119 Type 2 diabetes mellitus without complications: Secondary | ICD-10-CM | POA: Diagnosis present

## 2016-06-04 DIAGNOSIS — K589 Irritable bowel syndrome without diarrhea: Secondary | ICD-10-CM | POA: Diagnosis not present

## 2016-06-04 DIAGNOSIS — Z7984 Long term (current) use of oral hypoglycemic drugs: Secondary | ICD-10-CM | POA: Diagnosis not present

## 2016-06-04 DIAGNOSIS — G43909 Migraine, unspecified, not intractable, without status migrainosus: Secondary | ICD-10-CM | POA: Insufficient documentation

## 2016-06-04 DIAGNOSIS — F329 Major depressive disorder, single episode, unspecified: Secondary | ICD-10-CM | POA: Diagnosis not present

## 2016-06-04 DIAGNOSIS — M792 Neuralgia and neuritis, unspecified: Secondary | ICD-10-CM | POA: Insufficient documentation

## 2016-06-04 DIAGNOSIS — M48061 Spinal stenosis, lumbar region without neurogenic claudication: Secondary | ICD-10-CM | POA: Diagnosis not present

## 2016-06-04 DIAGNOSIS — M79604 Pain in right leg: Secondary | ICD-10-CM | POA: Diagnosis not present

## 2016-06-04 DIAGNOSIS — Z8674 Personal history of sudden cardiac arrest: Secondary | ICD-10-CM | POA: Diagnosis not present

## 2016-06-04 DIAGNOSIS — G4733 Obstructive sleep apnea (adult) (pediatric): Secondary | ICD-10-CM | POA: Diagnosis not present

## 2016-06-04 DIAGNOSIS — M79605 Pain in left leg: Secondary | ICD-10-CM | POA: Diagnosis not present

## 2016-06-04 DIAGNOSIS — K219 Gastro-esophageal reflux disease without esophagitis: Secondary | ICD-10-CM | POA: Diagnosis not present

## 2016-06-04 DIAGNOSIS — G5792 Unspecified mononeuropathy of left lower limb: Secondary | ICD-10-CM

## 2016-06-04 DIAGNOSIS — G5791 Unspecified mononeuropathy of right lower limb: Secondary | ICD-10-CM | POA: Diagnosis not present

## 2016-06-04 HISTORY — DX: Unspecified mononeuropathy of bilateral lower limbs: G57.93

## 2016-06-04 LAB — GLUCOSE, POCT (MANUAL RESULT ENTRY): POC Glucose: 143 mg/dl — AB (ref 70–99)

## 2016-06-04 MED ORDER — GABAPENTIN 100 MG PO CAPS: 100.0000 mg | ORAL_CAPSULE | Freq: Three times a day (TID) | ORAL | 3 refills | Status: DC

## 2016-06-04 NOTE — Progress Notes (Signed)
Patient is here for DM  Patient complains of neuropathy pain being present in the feet and legs. Pain is scaled currently at a 10.  Patient complains of kenalog cream burning her skin and itching persisting.  Patient has not taken medication today. Patient has not eaten today.  Patient denies any suicidal ideations at this time.

## 2016-06-04 NOTE — Progress Notes (Signed)
Marisa [name], is a 41 y.o. female  CNO:[id number]  QHU:[id number]  DOB - June 08, 1975  Chief Complaint  Patient presents with  . Diabetes      Subjective:   Marisa [name] is a 41 y.o. female with multiple medical history including type 2 diabetes mellitus on metformin, lumbar stenosis with bilateral lower Limb weakness now ambulating well without restrictions, morbid obesity, major depression and anxiety, chronic migraine headache came in today for a sick visit. Recently she complained of non-specific rashes on her limbs both upper and lower, she was prescribed Triamcinolone cream, but patient has noticed discoloration of her skin with patchy areas of lightening associated with burning sensations after using the cream. She is concerned about this. She also continues to experience neuropathic pains. She however continues to be strong, no longer needing wheelchair or walker to ambulate. She denies any other symptom. She denies any suicidal ideation or thoughts. BS is better controlled, last HbA1C was 7%. Patient claims adherence with medications but not diet or exercise, although she is willing to make conscious effort. She is scheduled to have colonoscopy in few weeks for her rectal bleeding. Patient has No headache, No chest pain, No abdominal pain - No Nausea, No new weakness tingling or numbness, No Cough - SOB.  Problem  Neuropathic Pain of Both Legs  Rash and Nonspecific Skin Eruption    ALLERGIES: Allergies  Allergen Reactions  . Mushroom Extract Complex Anaphylaxis, Swelling and Other (See Comments)    Reaction:  Eye swelling  . Penicillins Anaphylaxis and Other (See Comments)    Has patient had a PCN reaction causing immediate rash, facial/tongue/throat swelling, SOB or lightheadedness with hypotension: Yes Has patient had a PCN reaction causing severe rash involving mucus membranes or skin necrosis: No Has patient had a PCN reaction that required hospitalization  No Has patient had a PCN reaction occurring within the last 10 years: No If all of the above answers are "NO", then may proceed with Cephalosporin use.  Marland Kitchen Shellfish Allergy Anaphylaxis    PAST MEDICAL HISTORY: Past Medical History:  Diagnosis Date  . Anal pain    chronic  . Anemia   . Anxiety   . Asthma    exacerbation 02-28-2014 and 02-23-2014 secondary to Rhinovirus  . Chronic headaches   . Chronic low back pain   . Cyst of right ovary   . Difficult intravenous access    PER PT NEEDS PICC LINE  . Gait instability   . History of adenomatous polyp of colon   . History of cardiac arrest    during SVD 1992  . History of ectopic pregnancy    2009-  S/P LEFT SALPINGECTOMY  . History of panic attacks   . IBS (irritable bowel syndrome)   . Lumbar stenosis L4 -- L5 with bulging disk   w/ right leg weakness/ decreased mobility  . Mild obstructive sleep apnea    study 03-20-2014  no cpap recommended  . Type 2 diabetes mellitus ([place])   . Weakness of right leg    FROM BACK PROBLEM PER PT    MEDICATIONS AT HOME: Prior to Admission medications   Medication Sig Start Date End Date Taking? Authorizing Provider  albuterol (PROVENTIL HFA;VENTOLIN HFA) 108 (90 BASE) MCG/ACT inhaler Inhale 1-2 puffs into the lungs every 6 (six) hours as needed for wheezing or shortness of breath. 02/23/14  Yes Robbie Lis, MD  bacitracin-polymyxin b (POLYSPORIN) ophthalmic ointment Place 1 application into the left eye every 12 (twelve)  hours. apply to eye every 12 hours while awake 04/16/16  Yes Kelsie Kramp E Doreene Burke, MD  Blood Glucose Monitoring Suppl (ACCU-CHEK AVIVA PLUS) w/Device KIT 1 each by Does not apply route 3 (three) times daily. 04/16/16  Yes Tresa Garter, MD  budesonide-formoterol (SYMBICORT) 160-4.5 MCG/ACT inhaler Inhale 2 puffs into the lungs 2 (two) times daily. 04/17/14  Yes Kathee Delton, MD  dicyclomine (BENTYL) 10 MG capsule Take 1 capsule (10 mg total) by mouth 3 (three) times daily  before meals. 05/14/16  Yes [name], MD  glucose blood (ACCU-CHEK AVIVA) test strip Use as instructed 04/16/16  Yes Tresa Garter, MD  hydrocortisone (ANUSOL-HC) 25 MG suppository Place 1 suppository (25 mg total) rectally 2 (two) times daily. 04/16/16  Yes Tresa Garter, MD  ipratropium-albuterol (DUONEB) 0.5-2.5 (3) MG/3ML SOLN Take 3 mLs by nebulization every 6 (six) hours as needed (for wheezing/shortness of breath).   Yes Historical Provider, MD  Lancet Devices [facility]) lancets Use as instructed 04/16/16  Yes Tresa Garter, MD  lithium carbonate (LITHOBID) 300 MG CR tablet Take 1 tablet (300 mg total) by mouth at bedtime. 08/29/14  Yes Leonides Grills, MD  metFORMIN (GLUCOPHAGE) 500 MG tablet Take 1 tablet (500 mg total) by mouth 2 (two) times daily with a meal. 04/16/16  Yes Kenyon Eichelberger E Doreene Burke, MD  ondansetron (ZOFRAN) 4 MG tablet Take 1 tablet (4 mg total) by mouth every 6 (six) hours. 02/02/16  Yes Orpah Greek, MD  TRUEPLUS LANCETS 28G MISC 1 each by Does not apply route 3 (three) times daily. 04/16/16  Yes Tresa Garter, MD  gabapentin (NEURONTIN) 100 MG capsule Take 1 capsule (100 mg total) by mouth 3 (three) times daily. 06/04/16   Tresa Garter, MD  SUMAtriptan (IMITREX) 25 MG tablet Take 1 tablet (25 mg total) by mouth once. May repeat in 2 hours if headache persists or recurs. 04/16/16 04/16/16  Tresa Garter, MD  triamcinolone ointment (KENALOG) 0.5 % Apply 1 application topically 2 (two) times daily. Patient not taking: Reported on 06/04/2016 04/16/16   Tresa Garter, MD    Objective:   Vitals:   06/04/16 0943  BP: 121/86  Pulse: (!) 104  Resp: 18  Temp: 98 F (36.7 C)  TempSrc: Oral  SpO2: 98%  Weight: 266 lb (120.7 kg)  Height: '5\' 5"'  (1.651 m)   Exam General appearance : Awake, alert, not in any distress. Speech Clear. Not toxic looking, morbidly obese HEENT: Atraumatic and Normocephalic, pupils  equally reactive to light and accomodation Neck: Supple, no JVD. No cervical lymphadenopathy.  Chest: Good air entry bilaterally, no added sounds  CVS: S1 S2 regular, no murmurs.  Abdomen: Bowel sounds present, Non tender and not distended with no gaurding, rigidity or rebound. Extremities: B/L Lower Ext shows no edema, both legs are warm to touch Neurology: Awake alert, and oriented X 3, CN II-XII intact, Non focal Skin: Patchy hypopigmentations on upper and lower extremities, scratch marks++, no swelling  Data Review Lab Results  Component Value Date   HGBA1C 7.0 04/16/2016   HGBA1C 7.0 (H) 07/12/2015   HGBA1C 6.2 (H) 02/20/2014    Assessment & Plan   1. Well controlled type 2 diabetes mellitus ([place])  - Glucose (CBG) - Microalbumin/Creatinine Ratio, Urine - Lipid panel - CMP14+EGFR - Ambulatory referral to Podiatry  2. Rash and nonspecific skin eruption  - CBC with Differential/Platelet - TSH - ANA w/Reflex if Positive  - Ambulatory  referral to Dermatology  3. Neuropathic pain of both legs  - gabapentin (NEURONTIN) 100 MG capsule; Take 1 capsule (100 mg total) by mouth 3 (three) times daily.  Dispense: 90 capsule; Refill: 3 - VITAMIN D 25 Hydroxy (Vit-D Deficiency, Fractures) - ANA w/Reflex if Positive  Patient have been counseled extensively about nutrition and exercise. Other issues discussed during this visit include: low cholesterol diet, weight control and daily exercise, foot care, annual eye examinations at Ophthalmology, importance of adherence with medications and regular follow-up. We also discussed long term complications of uncontrolled diabetes and hypertension.   Return in about 3 months (around 09/03/2016) for Hemoglobin A1C and Follow up, DM, Follow up Pain and comorbidities.  The patient was given clear instructions to go to ER or return to medical center if symptoms don't improve, worsen or new problems develop. The patient verbalized understanding. The  patient was told to call to get lab results if they haven't heard anything in the next week.   This note has been created with Surveyor, quantity. Any transcriptional errors are unintentional.    Angelica Chessman, MD, [place], [place], [place], [place] and [facility] [place], [place]   06/04/2016, 10:14 AM

## 2016-06-04 NOTE — Patient Instructions (Signed)
Diabetes Mellitus and Skin Care Diabetes (diabetes mellitus) can lead to health problems over time, including skin problems. People with diabetes have a higher risk for many types of skin complications. This is because having poorly controlled blood sugar (glucose) levels can:  Damage nerves and blood vessels. This can result in decreased feeling in your legs and feet, which means you may not notice minor skin injuries that could lead to serious problems.  Reduce blood flow (circulation), which makes wounds heal more slowly and increases your risk of infection.  Cause areas of skin to become thick or discolored. What are some common skin conditions that affect people with diabetes? Diabetes often causes dry skin. It can also cause the skin on the feet to get thinner, break more easily, and heal more slowly. There are certain skin conditions that commonly affect people who have diabetes, such as:  Bacterial skin infections, such as styes, boils, infected hair follicles, and infections of the skin around the nails.  Fungal skin infections. These are most common in areas where skin rubs together, such as in the armpits or under the breasts.  Open sores, especially on the feet.  Tissue death (gangrene). This can happen on your feet if a serious infection does not heal properly. Gangrene can cause the need for a foot or leg to be surgically removed (amputated). Diabetes can also cause the skin to change. You may develop:  Dark, velvety markings on the skin that usually appear on the face, neck, armpits, inner thighs, and groin (acanthosis nigricans). This typically affects people of African-American and American-Indian descent.  Red, raised, scar-like tissue that may itch, feel painful, or develop into a wound (necrobiosis lipoidica).  Blisters on feet, toes, hands, or fingers.  Thickened, wax-like areas of skin that usually occur on the hands, forehead, or toes (digital sclerosis).  Brown or  red ring-shaped or half-ring-shaped patches of skin on the ears or fingers (disseminated granuloma).  Pea-shaped yellow bumps that may be itchy and surrounded by a red ring (eruptive xanthomatosis). This usually affects the arms, feet, buttocks, and the top of the hands.  Round, discolored patches of tan skin that do not hurt or itch (diabetic dermopathy). These may look like age spots. What do I need to know about itchy skin? It is common for people with diabetes to have itchy skin caused by dryness. Frequent high blood glucose levels can cause itchiness, and poor circulation and certain skin infections can make dry, itchy skin worse. If you have itchy skin that is red or covered in a rash, this could be a sign of an allergic reaction to a medicine. If you have a rash or if your skin is very itchy, contact your health care provider. You may need help to manage your diabetes better, or you may need treatment for an infection. How can I prevent skin breakdown? When you have diabetes and you get a badly infected ulcer or sore that does not heal, your skin can break down, especially if you have poor circulation or are on bed rest. To prevent skin breakdown:  Keep your skin clean and dry. Wash your skin often. Do not use hot water.  Do not use any products that contain nicotine or tobacco, such as cigarettes and e-cigarettes. Smoking affects the body's ability to heal. If you need help quitting, ask your health care provider.  Check your skin every day for cuts, bruises, redness, blisters, or sores, especially on your feet. Tell your health care provider  about any cuts, wounds, or sores you have, especially if they are healing slowly.  If you are on bed rest, try to change positions often. What else do I need to know about taking care of my skin?   To relieve dry skin and itching:  Limit baths and showers to 5-10 minutes.  Bathe with lukewarm water instead of hot water.  Use mild soap and  gentle skin cleansers. Do not use soap that is perfumed or harsh or dries your skin.  Put on lotion as soon as you finish bathing.  Make sure that your health care provider performs a visual foot exam at every medical visit.  Schedule a foot exam with your health care provider once every year. This exam includes an inspection of the structure and skin of your feet.  If you get a skin injury, such as a cut, blister, or sore, check the area every day for signs of infection. Check for:  More redness, swelling, or pain.  More fluid or blood.  Warmth.  Pus or a bad smell. Contact a health care provider if:  You develop a cut or sore, especially on your feet.  You develop signs of infection after a skin injury.  Your blood glucose level is higher than 240 mg/dL (13.3 mmol/L) for 2 days in a row.  You have itchy skin that develops redness or a rash.  You have discolored areas of skin.  You have areas where your skin is changing, such as thickening or appearing shiny. This information is not intended to replace advice given to you by your health care provider. Make sure you discuss any questions you have with your health care provider. Document Released: 07/17/2015 Document Revised: 08/24/2015 Document Reviewed: 07/17/2015 Elsevier Interactive Patient Education  2017 [place]. Neuropathic Pain Neuropathic pain is pain caused by damage to the nerves that are responsible for certain sensations in your body (sensory nerves). The pain can be caused by damage to:  The sensory nerves that send signals to your spinal cord and brain (peripheral nervous system).  The sensory nerves in your brain or spinal cord (central nervous system). Neuropathic pain can make you more sensitive to pain. What would be a minor sensation for most people may feel very painful if you have neuropathic pain. This is usually a long-term condition that can be difficult to treat. The type of pain can differ from  person to person. It may start suddenly (acute), or it may develop slowly and last for a long time (chronic). Neuropathic pain may come and go as damaged nerves heal or may stay at the same level for years. It often causes emotional distress, loss of sleep, and a lower quality of life. What are the causes? The most common cause of damage to a sensory nerve is diabetes. Many other diseases and conditions can also cause neuropathic pain. Causes of neuropathic pain can be classified as:  Toxic. Many drugs and chemicals can cause toxic damage. The most common cause of toxic neuropathic pain is damage from drug treatment for cancer (chemotherapy).  Metabolic. This type of pain can happen when a disease causes imbalances that damage nerves. Diabetes is the most common of these diseases. Vitamin B deficiency caused by long-term alcohol abuse is another common cause.  Traumatic. Any injury that cuts, crushes, or stretches a nerve can cause damage and pain. A common example is feeling pain after losing an arm or leg (phantom limb pain).  Compression-related. If a sensory nerve  gets trapped or compressed for a long period of time, the blood supply to the nerve can be cut off.  Vascular. Many blood vessel diseases can cause neuropathic pain by decreasing blood supply and oxygen to nerves.  Autoimmune. This type of pain results from diseases in which the body's defense system mistakenly attacks sensory nerves. Examples of autoimmune diseases that can cause neuropathic pain include lupus and multiple sclerosis.  Infectious. Many types of viral infections can damage sensory nerves and cause pain. Shingles infection is a common cause of this type of pain.  Inherited. Neuropathic pain can be a symptom of many diseases that are passed down through families (genetic). What are the signs or symptoms? The main symptom is pain. Neuropathic pain is often described as:  Burning.  Shock-like.  Stinging.  Hot or  cold.  Itching. How is this diagnosed? No single test can diagnose neuropathic pain. Your health care provider will do a physical exam and ask you about your pain. You may use a pain scale to describe how bad your pain is. You may also have tests to see if you have a high sensitivity to pain and to help find the cause and location of any sensory nerve damage. These tests may include:  Imaging studies, such as:  X-rays.  CT scan.  MRI.  Nerve conduction studies to test how well nerve signals travel through your sensory nerves (electrodiagnostic testing).  Stimulating your sensory nerves through electrodes on your skin and measuring the response in your spinal cord and brain (somatosensory evoked potentials). How is this treated? Treatment for neuropathic pain may change over time. You may need to try different treatment options or a combination of treatments. Some options include:  Over-the-counter pain relievers.  Prescription medicines. Some medicines used to treat other conditions may also help neuropathic pain. These include medicines to:  Control seizures (anticonvulsants).  Relieve depression (antidepressants).  Prescription-strength pain relievers (narcotics). These are usually used when other pain relievers do not help.  Transcutaneous nerve stimulation (TENS). This uses electrical currents to block painful nerve signals. The treatment is painless.  Topical and local anesthetics. These are medicines that numb the nerves. They can be injected as a nerve block or applied to the skin.  Alternative treatments, such as:  Acupuncture.  Meditation.  Massage.  Physical therapy.  Pain management programs.  Counseling. Follow these instructions at home:  Learn as much as you can about your condition.  Take medicines only as directed by your health care provider.  Work closely with all your health care providers to find what works best for you.  Have a good support  system at home.  Consider joining a chronic pain support group. Contact a health care provider if:  Your pain treatments are not helping.  You are having side effects from your medicines.  You are struggling with fatigue, mood changes, depression, or anxiety. This information is not intended to replace advice given to you by your health care provider. Make sure you discuss any questions you have with your health care provider. Document Released: 11/01/2003 Document Revised: 08/24/2015 Document Reviewed: 07/14/2013 Elsevier Interactive Patient Education  2017 [employer].

## 2016-06-05 ENCOUNTER — Encounter: Payer: Self-pay | Admitting: Internal Medicine

## 2016-06-05 LAB — CMP14+EGFR
ALT: 154 IU/L — ABNORMAL HIGH (ref 0–32)
AST: 172 IU/L — ABNORMAL HIGH (ref 0–40)
Albumin/Globulin Ratio: 1.4 (ref 1.2–2.2)
Albumin: 4.1 g/dL (ref 3.5–5.5)
Alkaline Phosphatase: 83 IU/L (ref 39–117)
BUN/Creatinine Ratio: 8 — ABNORMAL LOW (ref 9–23)
BUN: 5 mg/dL — ABNORMAL LOW (ref 6–24)
Bilirubin Total: 0.5 mg/dL (ref 0.0–1.2)
CO2: 20 mmol/L (ref 18–29)
Calcium: 9 mg/dL (ref 8.7–10.2)
Chloride: 97 mmol/L (ref 96–106)
Creatinine, Ser: 0.59 mg/dL (ref 0.57–1.00)
GFR calc Af Amer: 132 mL/min/{1.73_m2} (ref >59)
GFR calc non Af Amer: 114 mL/min/{1.73_m2} (ref >59)
Globulin, Total: 3 g/dL (ref 1.5–4.5)
Glucose: 126 mg/dL — ABNORMAL HIGH (ref 65–99)
Potassium: 4 mmol/L (ref 3.5–5.2)
Sodium: 137 mmol/L (ref 134–144)
Total Protein: 7.1 g/dL (ref 6.0–8.5)

## 2016-06-05 LAB — CBC WITH DIFFERENTIAL/PLATELET
Basophils Absolute: 0 10*3/uL (ref 0.0–0.2)
Basos: 0 %
EOS (ABSOLUTE): 0.1 10*3/uL (ref 0.0–0.4)
Eos: 2 %
Hematocrit: 43.5 % (ref 34.0–46.6)
Hemoglobin: 14.2 g/dL (ref 11.1–15.9)
Immature Grans (Abs): 0.1 10*3/uL (ref 0.0–0.1)
Immature Granulocytes: 1 %
Lymphocytes Absolute: 2.7 10*3/uL (ref 0.7–3.1)
Lymphs: 32 %
MCH: 32.3 pg (ref 26.6–33.0)
MCHC: 32.6 g/dL (ref 31.5–35.7)
MCV: 99 fL — ABNORMAL HIGH (ref 79–97)
Monocytes Absolute: 0.6 10*3/uL (ref 0.1–0.9)
Monocytes: 7 %
Neutrophils Absolute: 4.9 10*3/uL (ref 1.4–7.0)
Neutrophils: 58 %
Platelets: 378 10*3/uL (ref 150–379)
RBC: 4.39 x10E6/uL (ref 3.77–5.28)
RDW: 13.3 % (ref 12.3–15.4)
WBC: 8.3 10*3/uL (ref 3.4–10.8)

## 2016-06-05 LAB — LIPID PANEL
Chol/HDL Ratio: 4.9 ratio — ABNORMAL HIGH (ref 0.0–4.4)
Cholesterol, Total: 177 mg/dL (ref 100–199)
HDL: 36 mg/dL — ABNORMAL LOW (ref >39)
LDL Calculated: 97 mg/dL (ref 0–99)
Triglycerides: 220 mg/dL — ABNORMAL HIGH (ref 0–149)
VLDL Cholesterol Cal: 44 mg/dL — ABNORMAL HIGH (ref 5–40)

## 2016-06-05 LAB — TSH: TSH: 1.05 u[IU]/mL (ref 0.450–4.500)

## 2016-06-05 LAB — MICROALBUMIN / CREATININE URINE RATIO
Creatinine, Urine: 341.5 mg/dL
Microalb/Creat Ratio: 47.8 mg/g creat — ABNORMAL HIGH (ref 0.0–30.0)
Microalbumin, Urine: 163.4 ug/mL

## 2016-06-05 LAB — ANA W/REFLEX IF POSITIVE: Anti Nuclear Antibody(ANA): NEGATIVE

## 2016-06-05 LAB — VITAMIN D 25 HYDROXY (VIT D DEFICIENCY, FRACTURES): Vit D, 25-Hydroxy: 8.3 ng/mL — ABNORMAL LOW (ref 30.0–100.0)

## 2016-06-10 ENCOUNTER — Emergency Department (HOSPITAL_COMMUNITY): Payer: Medicaid Other

## 2016-06-10 ENCOUNTER — Other Ambulatory Visit: Payer: Self-pay | Admitting: Internal Medicine

## 2016-06-10 ENCOUNTER — Emergency Department (HOSPITAL_COMMUNITY)
Admission: EM | Admit: 2016-06-10 | Discharge: 2016-06-11 | Disposition: A | Discharge status: Home / Self Care | Payer: Medicaid Other | Attending: Emergency Medicine | Admitting: Emergency Medicine

## 2016-06-10 ENCOUNTER — Encounter (HOSPITAL_COMMUNITY): Payer: Self-pay | Admitting: Emergency Medicine

## 2016-06-10 DIAGNOSIS — R1013 Epigastric pain: Secondary | ICD-10-CM

## 2016-06-10 DIAGNOSIS — J45909 Unspecified asthma, uncomplicated: Secondary | ICD-10-CM | POA: Insufficient documentation

## 2016-06-10 DIAGNOSIS — E119 Type 2 diabetes mellitus without complications: Secondary | ICD-10-CM | POA: Insufficient documentation

## 2016-06-10 DIAGNOSIS — R109 Unspecified abdominal pain: Secondary | ICD-10-CM | POA: Diagnosis present

## 2016-06-10 DIAGNOSIS — Z87891 Personal history of nicotine dependence: Secondary | ICD-10-CM | POA: Insufficient documentation

## 2016-06-10 DIAGNOSIS — Z7984 Long term (current) use of oral hypoglycemic drugs: Secondary | ICD-10-CM | POA: Diagnosis not present

## 2016-06-10 LAB — I-STAT TROPONIN, ED: Troponin i, poc: 0 ng/mL (ref 0.00–0.08)

## 2016-06-10 LAB — BASIC METABOLIC PANEL
Anion gap: 14 (ref 5–15)
BUN: 6 mg/dL (ref 6–20)
CO2: 21 mmol/L — ABNORMAL LOW (ref 22–32)
Calcium: 9.5 mg/dL (ref 8.9–10.3)
Chloride: 103 mmol/L (ref 101–111)
Creatinine, Ser: 0.75 mg/dL (ref 0.44–1.00)
GFR calc Af Amer: >60 mL/min (ref >60)
GFR calc non Af Amer: >60 mL/min (ref >60)
Glucose, Bld: 156 mg/dL — ABNORMAL HIGH (ref 65–99)
Potassium: 3.8 mmol/L (ref 3.5–5.1)
Sodium: 138 mmol/L (ref 135–145)

## 2016-06-10 LAB — HEPATIC FUNCTION PANEL
ALT: 83 U/L — ABNORMAL HIGH (ref 14–54)
AST: 77 U/L — ABNORMAL HIGH (ref 15–41)
Albumin: 3.2 g/dL — ABNORMAL LOW (ref 3.5–5.0)
Alkaline Phosphatase: 71 U/L (ref 38–126)
Bilirubin, Direct: 0.2 mg/dL (ref 0.1–0.5)
Indirect Bilirubin: 0.2 mg/dL — ABNORMAL LOW (ref 0.3–0.9)
Total Bilirubin: 0.4 mg/dL (ref 0.3–1.2)
Total Protein: 6.8 g/dL (ref 6.5–8.1)

## 2016-06-10 LAB — CBC
HCT: 40.1 % (ref 36.0–46.0)
Hemoglobin: 13.2 g/dL (ref 12.0–15.0)
MCH: 32 pg (ref 26.0–34.0)
MCHC: 32.9 g/dL (ref 30.0–36.0)
MCV: 97.3 fL (ref 78.0–100.0)
Platelets: 342 10*3/uL (ref 150–400)
RBC: 4.12 MIL/uL (ref 3.87–5.11)
RDW: 12.9 % (ref 11.5–15.5)
WBC: 6.6 10*3/uL (ref 4.0–10.5)

## 2016-06-10 LAB — LIPASE, BLOOD: Lipase: 25 U/L (ref 11–51)

## 2016-06-10 MED ORDER — GI COCKTAIL ~~LOC~~
30.0000 mL | Freq: Once | ORAL | Status: AC
  Administered 2016-06-10: 30 mL via ORAL
  Filled 2016-06-10: qty 30

## 2016-06-10 MED ORDER — HYDROMORPHONE HCL 1 MG/ML IJ SOLN
1.0000 mg | Freq: Once | INTRAMUSCULAR | Status: AC
  Administered 2016-06-10: 1 mg via INTRAVENOUS
  Filled 2016-06-10: qty 1

## 2016-06-10 MED ORDER — SUCRALFATE 1 G PO TABS
1.0000 g | ORAL_TABLET | Freq: Once | ORAL | Status: AC
  Administered 2016-06-11: 1 g via ORAL
  Filled 2016-06-10: qty 1

## 2016-06-10 MED ORDER — IOPAMIDOL (ISOVUE-300) INJECTION 61%
INTRAVENOUS | Status: AC
  Administered 2016-06-10: 100 mL
  Filled 2016-06-10: qty 100

## 2016-06-10 MED ORDER — ONDANSETRON HCL 4 MG/2ML IJ SOLN
4.0000 mg | Freq: Once | INTRAMUSCULAR | Status: AC
  Administered 2016-06-10: 4 mg via INTRAVENOUS
  Filled 2016-06-10: qty 2

## 2016-06-10 MED ORDER — HYDROMORPHONE HCL 1 MG/ML IJ SOLN
1.0000 mg | Freq: Once | INTRAMUSCULAR | Status: AC
  Administered 2016-06-11: 1 mg via INTRAVENOUS
  Filled 2016-06-10: qty 1

## 2016-06-10 MED ORDER — ERGOCALCIFEROL 1.25 MG (50000 UT) PO CAPS: 50000.0000 [IU] | ORAL_CAPSULE | ORAL | 3 refills | Status: DC

## 2016-06-10 NOTE — ED Provider Notes (Signed)
[facility] DEPT Provider Note   CSN: [id number] Arrival date & time: 06/10/16  9833     History   Chief Complaint Chief Complaint  Patient presents with  . Chest Pain    HPI Marisa [name] is a 41 y.o. female.  HPI Marisa [name] is a 41 y.o. female with chronic abdominal pain, chronic back pain, chronic headaches, diabetes, hypertension, presents to emergency department complaining of abdominal pain. Patient states pain started this afternoon after taking her first dose of gabapentin. She reports pain in epigastric area that radiates into the chest. Reports the pain is constant, however sharp severe pain comes and goes, states feels like contractions. Her ports associated nausea and vomiting. Last bowel movement was this morning. Reports to her emesis was dark. She has tried drinking tea and taking ginger which did not help. Did not take any other medications prior to coming in. She reports history of small bowel obstruction and states this feels the same. Also reports history of gastritis and peptic ulcers. Denies recent alcohol. Does not take a lot of NSAIDs. Reports history of cholecystectomy and hysterectomy. No fever chills. No other associated symptoms.   Past Medical History:  Diagnosis Date  . Anal pain    chronic  . Anemia   . Anxiety   . Asthma    exacerbation 02-28-2014 and 02-23-2014 secondary to Rhinovirus  . Chronic headaches   . Chronic low back pain   . Cyst of right ovary   . Difficult intravenous access    PER PT NEEDS PICC LINE  . Gait instability   . History of adenomatous polyp of colon   . History of cardiac arrest    during SVD 1992  . History of ectopic pregnancy    2009-  S/P LEFT SALPINGECTOMY  . History of panic attacks   . IBS (irritable bowel syndrome)   . Lumbar stenosis L4 -- L5 with bulging disk   w/ right leg weakness/ decreased mobility  . Mild obstructive sleep apnea    study 03-20-2014  no cpap recommended  . Type  2 diabetes mellitus ([place])   . Weakness of right leg    FROM BACK PROBLEM PER PT    Patient Active Problem List   Diagnosis Date Noted  . Neuropathic pain of both legs 06/04/2016  . Carbuncle of labium 07/12/2015  . Rash and nonspecific skin eruption 07/12/2015  . Dandruff 03/26/2015  . Nipple discharge in female 03/26/2015  . Migraine variant with headache 05/09/2014  . Unable to ambulate 05/09/2014  . Severe recurrent major depressive disorder with psychotic features ([place]) 05/04/2014  . GAD (generalized anxiety disorder) 05/04/2014  . 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  . Social anxiety disorder 05/04/2014  . PTSD (post-traumatic stress disorder) 05/04/2014  . Cigarette nicotine dependence without complication 82/50/5397  . Gait disturbance 04/11/2014  . Depression 03/27/2014  . Falls 03/27/2014  . OSA (obstructive sleep apnea) 03/06/2014  . Insomnia 03/06/2014  . Anxiety   . History of cardiac arrest   . Acute bronchitis   . Asthma exacerbation 02/20/2014  . Well controlled type 2 diabetes mellitus ([place]) 02/20/2014  . Sore throat 02/20/2014  . Chest pain 02/20/2014  . Tachycardia 02/20/2014  . Diabetes mellitus without complication ([place]) [birth year]34[birth year]1937  . Pelvic pain 07/25/2013  . Rectal bleeding 07/25/2013  . Persistent vomiting 04/27/2013  . Atypical chest pain 04/26/2013  . Abdominal pain 04/26/2013  . S/P Total vaginal hysterectomy on 01/06/13 01/06/2013  . Dyspnea 09/07/2012  .  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  . Anemia 07/30/2012  . Current smoker 07/30/2012    Past Surgical History:  Procedure Laterality Date  . ABDOMINAL HYSTERECTOMY    . COLONOSCOPY Left 04/29/2013   Procedure: COLONOSCOPY;  Surgeon: Arta Silence, MD;  Location: [facility];  Service: Endoscopy;  Laterality: Left;  . EVALUATION UNDER ANESTHESIA WITH FISTULECTOMY N/A 04/20/2014   Procedure: EXAM UNDER ANESTHESIA ;  Surgeon: Leighton Ruff, MD;  Location[facility];   Service: General;  Laterality: N/A;  . FLEXIBLE SIGMOIDOSCOPY N/A 11/09/2013   Procedure: FLEXIBLE SIGMOIDOSCOPY;  Surgeon: Arta Silence, MD;  Location: [facility];  Service: Endoscopy;  Laterality: N/A;  . LAPAROSCOPIC CHOLECYSTECTOMY  2005  . SPHINCTEROTOMY N/A 04/20/2014   Procedure:  LATERAL INTERNAL SPHINCTEROTOMY;  Surgeon: Leighton Ruff, MD;  Location: [facility];  Service: General;  Laterality: N/A;  . TRANSTHORACIC ECHOCARDIOGRAM  12-30-2012   mild LVH/  ef 55-60%  . UNILATERAL SALPINGECTOMY  2009   laparotomy left salpingectomy-- ectopic preg.  Marland Kitchen VAGINAL HYSTERECTOMY N/A 01/06/2013   Procedure: HYSTERECTOMY VAGINAL;  Surgeon: Osborne Oman, MD;  Location: [facility];  Service: Gynecology;  Laterality: N/A;    OB History    Gravida Para Term Preterm AB Living   _0 SAB TAB Ectopic Multiple Live Births     1 1           Home Medications    Prior to Admission medications   Medication Sig Start Date End Date Taking? Authorizing Provider  albuterol (PROVENTIL HFA;VENTOLIN HFA) 108 (90 BASE) MCG/ACT inhaler Inhale 1-2 puffs into the lungs every 6 (six) hours as needed for wheezing or shortness of breath. 02/23/14   Robbie Lis, MD  bacitracin-polymyxin b (POLYSPORIN) ophthalmic ointment Place 1 application into the left eye every 12 (twelve) hours. apply to eye every 12 hours while awake 04/16/16   Tresa Garter, MD  Blood Glucose Monitoring Suppl (ACCU-CHEK AVIVA PLUS) w/Device KIT 1 each by Does not apply route 3 (three) times daily. 04/16/16   Tresa Garter, MD  budesonide-formoterol (SYMBICORT) 160-4.5 MCG/ACT inhaler Inhale 2 puffs into the lungs 2 (two) times daily. 04/17/14   Kathee Delton, MD  dicyclomine (BENTYL) 10 MG capsule Take 1 capsule (10 mg total) by mouth 3 (three) times daily before meals. 05/14/16   [name], MD  ergocalciferol (VITAMIN D2) 50000 units capsule Take 1 capsule (50,000 Units total) by mouth once a week.  06/10/16   Tresa Garter, MD  gabapentin (NEURONTIN) 100 MG capsule Take 1 capsule (100 mg total) by mouth 3 (three) times daily. 06/04/16   Tresa Garter, MD  glucose blood (ACCU-CHEK AVIVA) test strip Use as instructed 04/16/16   Tresa Garter, MD  hydrocortisone (ANUSOL-HC) 25 MG suppository Place 1 suppository (25 mg total) rectally 2 (two) times daily. 04/16/16   Tresa Garter, MD  ipratropium-albuterol (DUONEB) 0.5-2.5 (3) MG/3ML SOLN Take 3 mLs by nebulization every 6 (six) hours as needed (for wheezing/shortness of breath).    Historical Provider, MD  Lancet Devices (ACCU-CHEK [facility]) lancets Use as instructed 04/16/16   Tresa Garter, MD  lithium carbonate (LITHOBID) 300 MG CR tablet Take 1 tablet (300 mg total) by mouth at bedtime. 08/29/14   Leonides Grills, MD  metFORMIN (GLUCOPHAGE) 500 MG tablet Take 1 tablet (500 mg total) by mouth 2 (two) times daily with a meal. 04/16/16   Olugbemiga E  Doreene Burke, MD  ondansetron (ZOFRAN) 4 MG tablet Take 1 tablet (4 mg total) by mouth every 6 (six) hours. 02/02/16   Orpah Greek, MD  SUMAtriptan (IMITREX) 25 MG tablet Take 1 tablet (25 mg total) by mouth once. May repeat in 2 hours if headache persists or recurs. 04/16/16 04/16/16  Tresa Garter, MD  triamcinolone ointment (KENALOG) 0.5 % Apply 1 application topically 2 (two) times daily. Patient not taking: Reported on 06/04/2016 04/16/16   Tresa Garter, MD  TRUEPLUS LANCETS 28G MISC 1 each by Does not apply route 3 (three) times daily. 04/16/16   Tresa Garter, MD    Family History Family History  Problem Relation Age of Onset  . Hypertension Mother   . Diabetes Mother   . Allergies Mother   . Heart disease Mother   . Clotting disorder Mother   . Cancer Father   . Hyperlipidemia Father   . Hypertension Father   . Colon cancer Father   . Heart disease Maternal Grandmother   . Schizophrenia Sister   . Bipolar disorder Sister   . Clotting  disorder Sister   . Bipolar disorder Brother   . Kidney disease Brother   . Bipolar disorder Sister   . Pancreatic cancer Maternal Aunt   . Prostate cancer Maternal Uncle   . Liver cancer Maternal Grandfather   . Liver cancer Paternal Grandfather     Social History Social History  Substance Use Topics  . Smoking status: Former Smoker    Packs/day: 0.20    Years: 11.00    Types: Cigarettes    Quit date: 11/17/2013  . Smokeless tobacco: Never Used  . Alcohol use No     Allergies   Mushroom extract complex; Penicillins; and Shellfish allergy   Review of Systems Review of Systems  Constitutional: Negative for chills and fever.  Respiratory: Positive for chest tightness. Negative for cough and shortness of breath.   Cardiovascular: Negative for chest pain, palpitations and leg swelling.  Gastrointestinal: Positive for abdominal pain, nausea and vomiting. Negative for diarrhea.  Genitourinary: Negative for dysuria, flank pain and pelvic pain.  Musculoskeletal: Negative for arthralgias, myalgias, neck pain and neck stiffness.  Skin: Negative for rash.  Neurological: Negative for dizziness, weakness and headaches.  All other systems reviewed and are negative.    Physical Exam Updated Vital Signs BP 105/70   Pulse 91   Temp 98.7 F (37.1 C) (Oral)   Resp 17   LMP 11/27/2012   SpO2 97%   Physical Exam  Constitutional: She appears well-developed and well-nourished. No distress.  HENT:  Head: Normocephalic.  Eyes: Conjunctivae are normal.  Neck: Neck supple.  Cardiovascular: Normal rate, regular rhythm and normal heart sounds.   Pulmonary/Chest: Effort normal and breath sounds normal. No respiratory distress. She has no wheezes. She has no rales.  Abdominal: Soft. Bowel sounds are normal. She exhibits no distension. There is tenderness. There is no rebound.  LUQ, RUQ, epigastric tenderness.   Musculoskeletal: She exhibits no edema.  Neurological: She is alert.  Skin:  Skin is warm and dry.  Psychiatric: She has a normal mood and affect. Her behavior is normal.  Nursing note and vitals reviewed.    ED Treatments / Results  Labs (all labs ordered are listed, but only abnormal results are displayed) Labs Reviewed  BASIC METABOLIC PANEL - Abnormal; Notable for the following:       Result Value   CO2 21 (*)    Glucose, Bld  156 (*)    All other components within normal limits  HEPATIC FUNCTION PANEL - Abnormal; Notable for the following:    Albumin 3.2 (*)    AST 77 (*)    ALT 83 (*)    Indirect Bilirubin 0.2 (*)    All other components within normal limits  CBC  LIPASE, BLOOD  I-STAT TROPOININ, ED    EKG  EKG Interpretation  Date/Time:  Tuesday June 10 2016 18:43:17 EDT Ventricular Rate:  98 PR Interval:  132 QRS Duration: 74 QT Interval:  338 QTC Calculation: 431 R Axis:   2 Text Interpretation:  Normal sinus rhythm Cannot rule out Anterior infarct , age undetermined Abnormal ECG Otherwise no significant change Confirmed by [facility] MD, PEDRO [phone]) on 06/10/2016 10:51:03 PM       Radiology Dg Chest 2 View  Result Date: 06/10/2016 CLINICAL DATA:  41 year old female with burning in the middle of the chest and abdomen. Shortness of breath today. EXAM: CHEST  2 VIEW COMPARISON:  Chest x-ray 04/27/2016. FINDINGS: Lung volumes are normal. No consolidative airspace disease. No pleural effusions. No pneumothorax. No pulmonary nodule or mass noted. Pulmonary vasculature and the cardiomediastinal silhouette are within normal limits. IMPRESSION: No radiographic evidence of acute cardiopulmonary disease. Electronically Signed   By: Vinnie Langton M.D.   On: 06/10/2016 19:30    Procedures Procedures (including critical care time)  Medications Ordered in ED Medications  gi cocktail (Maalox,Lidocaine,Donnatal) (not administered)  HYDROmorphone (DILAUDID) injection 1 mg (not administered)  ondansetron (ZOFRAN) injection 4 mg (not administered)      Initial Impression / Assessment and Plan / ED Course  I have reviewed the triage vital signs and the nursing notes.  Pertinent labs & imaging results that were available during my care of the patient were reviewed by me and considered in my medical decision making (see chart for details).     Patient with epigastric abdominal pain, nausea, vomiting. History of recent abdominal trouble with visit to the ED, has followed up with gastroenterology who is planning to perform endoscopy and colonoscopy. Patient states this pain is more severe than in the past. She is not vomiting ED but appears to be in a lot of pain. Will give pain medications, will do CT abdomen and pelvis to rule out small bowel structure.  12:24 AM Patient CT scan is negative other than diarrhea. Patient denies diarrhea. She has had some relieve with Dilaudid IV, but states pain is coming back. We will try another dose and try Carafate. GI cocktail did not relieve her pain. Her labs did not show any significant abnormalities, her LFTs slightly elevated, but have always been elevated in the past. Her CBC is completely normal. We'll continue to manage her pain.  1:11 AM Pain improved. Patient is drinking, tolerating it well. Suspect gastritis. Will start on Pepcid and Carafate. Follow-up with family doctor. Return precautions discussed.  Vitals:   06/10/16 2215 06/10/16 2315 06/10/16 2330 06/11/16 0030  BP: 105/70 110/70 107/77 100/89  Pulse: 91 83 83 97  Resp: _0 (!) 23  Temp:      TempSrc:      SpO2: 97% 97% 96% 97%     Final Clinical Impressions(s) / ED Diagnoses   Final diagnoses:  Epigastric pain    New Prescriptions New Prescriptions   FAMOTIDINE (PEPCID) 20 MG TABLET    Take 1 tablet (20 mg total) by mouth 2 (two) times daily.   HYDROCODONE-ACETAMINOPHEN (NORCO) 5-325 MG TABLET  Take 1 tablet by mouth every 6 (six) hours as needed for moderate pain.   SUCRALFATE (CARAFATE) 1 G TABLET    Take 1  tablet (1 g total) by mouth 4 (four) times daily -  with meals and at bedtime.     Jeannett Senior, PA-C 06/11/16 0111    Fatima Blank, MD 06/11/16 [phone]

## 2016-06-10 NOTE — ED Notes (Signed)
PA at bedside.

## 2016-06-10 NOTE — ED Notes (Signed)
Patient transported to CT 

## 2016-06-10 NOTE — ED Triage Notes (Signed)
Pt reports she had her Gabapentin filled today for her nerve pain and 30 minutes after taking it she started having shooting pains in her chest and epigastric area.

## 2016-06-11 MED ORDER — SUCRALFATE 1 G PO TABS: 1.0000 g | ORAL_TABLET | Freq: Three times a day (TID) | ORAL | 0 refills | Status: DC

## 2016-06-11 MED ORDER — FAMOTIDINE 20 MG PO TABS: 20.0000 mg | ORAL_TABLET | Freq: Two times a day (BID) | ORAL | 0 refills | Status: DC

## 2016-06-11 MED ORDER — HYDROCODONE-ACETAMINOPHEN 5-325 MG PO TABS: 1.0000 | ORAL_TABLET | Freq: Four times a day (QID) | ORAL | 0 refills | Status: DC | PRN

## 2016-06-11 NOTE — Discharge Instructions (Signed)
Avoid spicy or tomato based foods. Take carafate and pepcid as prescribed. Continue your zofran at home for nausea. Follow up with family doctor for recheck. Return if worsening.

## 2016-06-16 ENCOUNTER — Telehealth: Payer: Self-pay | Admitting: *Deleted

## 2016-06-16 NOTE — Telephone Encounter (Signed)
-----   Message from Tresa Garter, MD sent at 06/10/2016  6:25 PM EDT ----- Result showed very low Vitamin D. Prescribed to the pharmacy. Liver enzyme slightly elevated, may be from fat in the liver, please limit saturated fat to no more than 7% of your calories, limit cholesterol to 200 mg/day, increase fiber and exercise as tolerated. If needed we may add cholesterol lowering medication to your regimen.

## 2016-06-16 NOTE — Telephone Encounter (Signed)
Patient verified DOB Patient is aware of vitamin d level being low and a supplement being prescribed to the [facility] pharmacy. Patient is also aware of liver enzymes being slightly elevated and needing to limit her satuarted fat intake and increase fiber and exercise. Patient complains of leg pain being present and MA informed patient of low vitamin d causing muscle aches and fatigue. Patient expressed her understanding and had no further questions at this time.

## 2016-06-25 ENCOUNTER — Encounter: Payer: Self-pay | Admitting: Podiatry

## 2016-06-25 ENCOUNTER — Ambulatory Visit (INDEPENDENT_AMBULATORY_CARE_PROVIDER_SITE_OTHER): Payer: Medicaid Other

## 2016-06-25 ENCOUNTER — Ambulatory Visit (INDEPENDENT_AMBULATORY_CARE_PROVIDER_SITE_OTHER): Payer: Medicaid Other | Admitting: Podiatry

## 2016-06-25 VITALS — BP 114/76 | HR 117 | Resp 16 | Ht 61.0 in | Wt 260.0 lb

## 2016-06-25 DIAGNOSIS — M79672 Pain in left foot: Principal | ICD-10-CM

## 2016-06-25 DIAGNOSIS — M722 Plantar fascial fibromatosis: Secondary | ICD-10-CM

## 2016-06-25 DIAGNOSIS — M79671 Pain in right foot: Secondary | ICD-10-CM

## 2016-06-25 MED ORDER — TRIAMCINOLONE ACETONIDE 10 MG/ML IJ SUSP
10.0000 mg | Freq: Once | INTRAMUSCULAR | Status: AC
  Administered 2016-06-25: 10 mg

## 2016-06-25 NOTE — Progress Notes (Signed)
   Subjective:    Patient ID: CATHRYNE MANCEBO, female    DOB: May 21, 1975, 41 y.o.   MRN: [id number]  HPI Chief Complaint  Patient presents with  . Foot Pain    Bilateral; dorsal and plantar; pt stated, "Right foot hurts more than the left"; x6 months  . Toe Pain    Right foot; great toe; pt stated, "Feels numbness in toe"; x1 yr      Review of Systems  Constitutional: Positive for activity change, appetite change, diaphoresis and fatigue.  HENT: Positive for ear pain, sinus pain and tinnitus.   Eyes: Positive for visual disturbance.  Respiratory: Positive for chest tightness and shortness of breath.   Cardiovascular: Positive for chest pain, palpitations and leg swelling.  Gastrointestinal: Positive for abdominal distention, abdominal pain, blood in stool and diarrhea.  Endocrine: Positive for heat intolerance, polydipsia and polyuria.  Genitourinary: Positive for frequency.  Musculoskeletal: Positive for arthralgias, back pain, gait problem and myalgias.  Skin: Positive for color change.  Allergic/Immunologic: Positive for food allergies.  Neurological: Positive for dizziness, weakness, light-headedness, numbness and headaches.  Hematological: Bruises/bleeds easily.  Psychiatric/Behavioral: Positive for confusion.  All other systems reviewed and are negative.      Objective:   Physical Exam        Assessment & Plan:

## 2016-06-25 NOTE — Progress Notes (Signed)
Subjective:    Patient ID: Marisa [name], female   DOB: 41 y.o.   MRN: [id number]   HPI patient states she's having a lot of pain in her heels and states her feet in general can get sore and she's got some discoloration of her big toenails    Review of Systems  All other systems reviewed and are negative.       Objective:  Physical Exam  Cardiovascular: Intact distal pulses.   Musculoskeletal: Normal range of motion.  Neurological: She is alert.  Skin: Skin is warm.  Nursing note and vitals reviewed.  neurovascular status found to be intact with muscle strength adequate range of motion within normal limits. Patient is moderate depression of the arch and exquisite discomfort plantar heel region bilateral with inflammation fluid around the medial band. 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 Patient be seen back if symptoms persist or other pain should develop  X-rays indicate spurs on the plantar aspect of the heel bilateral and posterior heel bilateral with no indications of advanced arthritis stress fracture

## 2016-06-29 ENCOUNTER — Emergency Department (HOSPITAL_COMMUNITY)
Admission: EM | Admit: 2016-06-29 | Discharge: 2016-06-29 | Disposition: A | Discharge status: Home / Self Care | Payer: Medicaid Other | Attending: Emergency Medicine | Admitting: Emergency Medicine

## 2016-06-29 ENCOUNTER — Encounter (HOSPITAL_COMMUNITY): Payer: Self-pay | Admitting: Emergency Medicine

## 2016-06-29 ENCOUNTER — Emergency Department (HOSPITAL_COMMUNITY): Payer: Medicaid Other

## 2016-06-29 DIAGNOSIS — F17210 Nicotine dependence, cigarettes, uncomplicated: Secondary | ICD-10-CM | POA: Insufficient documentation

## 2016-06-29 DIAGNOSIS — M79604 Pain in right leg: Secondary | ICD-10-CM | POA: Diagnosis not present

## 2016-06-29 DIAGNOSIS — G8929 Other chronic pain: Secondary | ICD-10-CM | POA: Insufficient documentation

## 2016-06-29 DIAGNOSIS — E119 Type 2 diabetes mellitus without complications: Secondary | ICD-10-CM | POA: Diagnosis not present

## 2016-06-29 DIAGNOSIS — Z7984 Long term (current) use of oral hypoglycemic drugs: Secondary | ICD-10-CM | POA: Diagnosis not present

## 2016-06-29 DIAGNOSIS — Z79899 Other long term (current) drug therapy: Secondary | ICD-10-CM | POA: Diagnosis not present

## 2016-06-29 DIAGNOSIS — J45909 Unspecified asthma, uncomplicated: Secondary | ICD-10-CM | POA: Insufficient documentation

## 2016-06-29 DIAGNOSIS — R0789 Other chest pain: Secondary | ICD-10-CM | POA: Diagnosis not present

## 2016-06-29 LAB — CBC
HCT: 42.2 % (ref 36.0–46.0)
Hemoglobin: 13.8 g/dL (ref 12.0–15.0)
MCH: 32.1 pg (ref 26.0–34.0)
MCHC: 32.7 g/dL (ref 30.0–36.0)
MCV: 98.1 fL (ref 78.0–100.0)
Platelets: 324 10*3/uL (ref 150–400)
RBC: 4.3 MIL/uL (ref 3.87–5.11)
RDW: 13.2 % (ref 11.5–15.5)
WBC: 8.7 10*3/uL (ref 4.0–10.5)

## 2016-06-29 LAB — I-STAT TROPONIN, ED: Troponin i, poc: 0 ng/mL (ref 0.00–0.08)

## 2016-06-29 LAB — BASIC METABOLIC PANEL
Anion gap: 9 (ref 5–15)
BUN: 8 mg/dL (ref 6–20)
CO2: 21 mmol/L — ABNORMAL LOW (ref 22–32)
Calcium: 9 mg/dL (ref 8.9–10.3)
Chloride: 104 mmol/L (ref 101–111)
Creatinine, Ser: 0.72 mg/dL (ref 0.44–1.00)
GFR calc Af Amer: >60 mL/min (ref >60)
GFR calc non Af Amer: >60 mL/min (ref >60)
Glucose, Bld: 231 mg/dL — ABNORMAL HIGH (ref 65–99)
Potassium: 3.9 mmol/L (ref 3.5–5.1)
Sodium: 134 mmol/L — ABNORMAL LOW (ref 135–145)

## 2016-06-29 LAB — TROPONIN I: Troponin I: <0.03 ng/mL (ref <0.03)

## 2016-06-29 MED ORDER — KETOROLAC TROMETHAMINE 30 MG/ML IJ SOLN
15.0000 mg | Freq: Once | INTRAMUSCULAR | Status: AC
  Administered 2016-06-29: 15 mg via INTRAMUSCULAR
  Filled 2016-06-29: qty 1

## 2016-06-29 MED ORDER — PREDNISONE 20 MG PO TABS: 40.0000 mg | ORAL_TABLET | Freq: Every day | ORAL | 0 refills | Status: DC

## 2016-06-29 MED ORDER — PREDNISONE 20 MG PO TABS
60.0000 mg | ORAL_TABLET | ORAL | Status: AC
  Administered 2016-06-29: 60 mg via ORAL
  Filled 2016-06-29: qty 3

## 2016-06-29 MED ORDER — LORAZEPAM 2 MG/ML IJ SOLN
1.0000 mg | Freq: Once | INTRAMUSCULAR | Status: AC
  Administered 2016-06-29: 1 mg via INTRAMUSCULAR
  Filled 2016-06-29: qty 1

## 2016-06-29 NOTE — ED Notes (Signed)
Pt from home with complaints of leg pain x 2 days and central chest pain that began today when pt was eating dinner with her family. Pt rates her pain in her legs at 20/10 and her chest pain at 10/10. Pt states her leg pain caused her to fall at the restaurant. Pt states she caught herself and did not sustain any injuries. Pt denies radiation of chest pain. Pt has normal heart sounds and clear lung sounds. Pt had adequate cap refill. Pt states she has been seen by her PCP for her leg pain.

## 2016-06-29 NOTE — Discharge Instructions (Signed)
As discussed, your evaluation today has been largely reassuring.  But, it is important that you monitor your condition carefully, and do not hesitate to return to the ED if you develop new, or concerning changes in your condition. ? ?Otherwise, please follow-up with your physician for appropriate ongoing care. ? ?

## 2016-06-29 NOTE — ED Provider Notes (Signed)
[facility] DEPT Provider Note   CSN: [id number] Arrival date & time: 06/29/16  1812     History   Chief Complaint Chief Complaint  Patient presents with  . Leg Pain  . Chest Pain    HPI CHARITIE HINOTE is a 41 y.o. female.  HPI  Patient presents with multiple complaints. She is here with her husband who assists with the history of present illness. Patient notes history of chronic pain in multiple areas, including chronic right lower extremity pain, back pain. Today, about 2 hours ago the patient developed new, more severe chest pain than her usual discomfort. She recalls 5 specific bursts of left upper chest pain, without clear precipitant. Following these episodes the patient had more severe pain than usual in her right leg as well, almost fell to the ground, almost passed out. Gradually the pain in the left upper abdomen, left upper chest, right leg diffusely, as well as headache, nausea, anorexia. Patient is working with primary care physician, is scheduled to see a neurologist in the coming weeks.   Past Medical History:  Diagnosis Date  . Anal pain    chronic  . Anemia   . Anxiety   . Asthma    exacerbation 02-28-2014 and 02-23-2014 secondary to Rhinovirus  . Chronic headaches   . Chronic low back pain   . Cyst of right ovary   . Difficult intravenous access    PER PT NEEDS PICC LINE  . Gait instability   . History of adenomatous polyp of colon   . History of cardiac arrest    during SVD 1992  . History of ectopic pregnancy    2009-  S/P LEFT SALPINGECTOMY  . History of panic attacks   . IBS (irritable bowel syndrome)   . Lumbar stenosis L4 -- L5 with bulging disk   w/ right leg weakness/ decreased mobility  . Mild obstructive sleep apnea    study 03-20-2014  no cpap recommended  . Type 2 diabetes mellitus ([place])   . Weakness of right leg    FROM BACK PROBLEM PER PT    Patient Active Problem List   Diagnosis Date Noted  . Neuropathic pain  of both legs 06/04/2016  . Carbuncle of labium 07/12/2015  . Rash and nonspecific skin eruption 07/12/2015  . Dandruff 03/26/2015  . Nipple discharge in female 03/26/2015  . Migraine variant with headache 05/09/2014  . Unable to ambulate 05/09/2014  . Severe recurrent major depressive disorder with psychotic features ([place]) 05/04/2014  . GAD (generalized anxiety disorder) 05/04/2014  . 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  . Social anxiety disorder 05/04/2014  . PTSD (post-traumatic stress disorder) 05/04/2014  . Cigarette nicotine dependence without complication 93/73/4287  . Gait disturbance 04/11/2014  . Depression 03/27/2014  . Falls 03/27/2014  . OSA (obstructive sleep apnea) 03/06/2014  . Insomnia 03/06/2014  . Anxiety   . History of cardiac arrest   . Acute bronchitis   . Asthma exacerbation 02/20/2014  . Well controlled type 2 diabetes mellitus ([place]) 02/20/2014  . Sore throat 02/20/2014  . Chest pain 02/20/2014  . Tachycardia 02/20/2014  . Diabetes mellitus without complication ([place]) 68/12/5724  . Pelvic pain 07/25/2013  . Rectal bleeding 07/25/2013  . Persistent vomiting 04/27/2013  . Atypical chest pain 04/26/2013  . Abdominal pain 04/26/2013  . S/P Total vaginal hysterectomy on 01/06/13 01/06/2013  . Dyspnea 09/07/2012  . 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  . Anemia 07/30/2012  . Current smoker 07/30/2012    Past Surgical  History:  Procedure Laterality Date  . ABDOMINAL HYSTERECTOMY    . COLONOSCOPY Left 04/29/2013   Procedure: COLONOSCOPY;  Surgeon: Arta Silence, MD;  Location: [facility];  Service: Endoscopy;  Laterality: Left;  . EVALUATION UNDER ANESTHESIA WITH FISTULECTOMY N/A 04/20/2014   Procedure: EXAM UNDER ANESTHESIA ;  Surgeon: Leighton Ruff, MD;  Location: [facility];  Service: General;  Laterality: N/A;  . FLEXIBLE SIGMOIDOSCOPY N/A 11/09/2013   Procedure: FLEXIBLE SIGMOIDOSCOPY;  Surgeon: Arta Silence, MD;  Location: [facility];  Service: Endoscopy;  Laterality: N/A;  . LAPAROSCOPIC CHOLECYSTECTOMY  2005  . SPHINCTEROTOMY N/A 04/20/2014   Procedure:  LATERAL INTERNAL SPHINCTEROTOMY;  Surgeon: Leighton Ruff, MD;  Location: [facility];  Service: General;  Laterality: N/A;  . TRANSTHORACIC ECHOCARDIOGRAM  12-30-2012   mild LVH/  ef 55-60%  . UNILATERAL SALPINGECTOMY  2009   laparotomy left salpingectomy-- ectopic preg.  Marland Kitchen VAGINAL HYSTERECTOMY N/A 01/06/2013   Procedure: HYSTERECTOMY VAGINAL;  Surgeon: Osborne Oman, MD;  Location: [facility];  Service: Gynecology;  Laterality: N/A;    OB History    Gravida Para Term Preterm AB Living   _0 SAB TAB Ectopic Multiple Live Births     1 1           Home Medications    Prior to Admission medications   Medication Sig Start Date End Date Taking? Authorizing Provider  albuterol (PROVENTIL HFA;VENTOLIN HFA) 108 (90 BASE) MCG/ACT inhaler Inhale 1-2 puffs into the lungs every 6 (six) hours as needed for wheezing or shortness of breath. 02/23/14  Yes Robbie Lis, MD  budesonide-formoterol [facility]) 160-4.5 MCG/ACT inhaler Inhale 2 puffs into the lungs 2 (two) times daily. 04/17/14  Yes Clance, Armando Reichert, MD  dicyclomine (BENTYL) 10 MG capsule Take 1 capsule (10 mg total) by mouth 3 (three) times daily before meals. 05/14/16  Yes Danis, Kirke Corin, MD  ergocalciferol (VITAMIN D2) 50000 units capsule Take 1 capsule (50,000 Units total) by mouth once a week. 06/10/16  Yes Tresa Garter, MD  famotidine (PEPCID) 20 MG tablet Take 1 tablet (20 mg total) by mouth 2 (two) times daily. 06/11/16  Yes Kirichenko, Tatyana, PA-C  gabapentin (NEURONTIN) 100 MG capsule Take 1 capsule (100 mg total) by mouth 3 (three) times daily. 06/04/16  Yes Jegede, Olugbemiga E, MD  ipratropium-albuterol (DUONEB) 0.5-2.5 (3) MG/3ML SOLN Take 3 mLs by nebulization every 6 (six) hours as needed (for wheezing/shortness of breath).   Yes [provider]    lithium carbonate (LITHOBID) 300 MG CR tablet Take 1 tablet (300 mg total) by mouth at bedtime. 08/29/14  Yes Erin Sons D, MD  metFORMIN (GLUCOPHAGE) 500 MG tablet Take 1 tablet (500 mg total) by mouth 2 (two) times daily with a meal. 04/16/16  Yes Jegede, Olugbemiga E, MD  ondansetron (ZOFRAN) 4 MG tablet Take 1 tablet (4 mg total) by mouth every 6 (six) hours. 02/02/16  Yes Pollina, Gwenyth Allegra, MD  sucralfate (CARAFATE) 1 g tablet Take 1 tablet (1 g total) by mouth 4 (four) times daily -  with meals and at bedtime. 06/11/16  Yes Kirichenko, Tatyana, PA-C  bacitracin-polymyxin b (POLYSPORIN) ophthalmic ointment Place 1 application into the left eye every 12 (twelve) hours. apply to eye every 12 hours while awake Patient not taking: Reported on 06/29/2016 04/16/16   Tresa Garter, MD  Blood Glucose Monitoring Suppl (ACCU-CHEK AVIVA PLUS) w/Device KIT 1 each by  Does not apply route 3 (three) times daily. 04/16/16   Tresa Garter, MD  glucose blood (ACCU-CHEK AVIVA) test strip Use as instructed 04/16/16   Tresa Garter, MD  hydrocortisone (ANUSOL-HC) 25 MG suppository Place 1 suppository (25 mg total) rectally 2 (two) times daily. 04/16/16   Tresa Garter, MD  Lancet Devices [facility]) lancets Use as instructed 04/16/16   Tresa Garter, MD  SUMAtriptan (IMITREX) 25 MG tablet Take 1 tablet (25 mg total) by mouth once. May repeat in 2 hours if headache persists or recurs. 04/16/16 04/16/16  Tresa Garter, MD  triamcinolone ointment (KENALOG) 0.5 % Apply 1 application topically 2 (two) times daily. Patient not taking: Reported on 06/29/2016 04/16/16   Tresa Garter, MD  TRUEPLUS LANCETS 28G MISC 1 each by Does not apply route 3 (three) times daily. 04/16/16   Tresa Garter, MD    Family History Family History  Problem Relation Age of Onset  . Hypertension Mother   . Diabetes Mother   . Allergies Mother   . Heart disease Mother   .  Clotting disorder Mother   . Cancer Father   . Hyperlipidemia Father   . Hypertension Father   . Colon cancer Father   . Heart disease Maternal Grandmother   . Schizophrenia Sister   . Bipolar disorder Sister   . Clotting disorder Sister   . Bipolar disorder Brother   . Kidney disease Brother   . Bipolar disorder Sister   . Pancreatic cancer Maternal Aunt   . Prostate cancer Maternal Uncle   . Liver cancer Maternal Grandfather   . Liver cancer Paternal Grandfather     Social History Social History  Substance Use Topics  . Smoking status: Current Some Day Smoker    Packs/day: 0.20    Years: 11.00    Types: Cigarettes    Last attempt to quit: 11/17/2013  . Smokeless tobacco: Never Used  . Alcohol use No     Allergies   Mushroom extract complex; Penicillins; and Shellfish allergy   Review of Systems Review of Systems  Constitutional:       Per HPI, otherwise negative  HENT:       Per HPI, otherwise negative  Respiratory:       Per HPI, otherwise negative  Cardiovascular:       Per HPI, otherwise negative  Gastrointestinal: Positive for nausea. Negative for vomiting.  Endocrine:       Negative aside from HPI  Genitourinary:       Neg aside from HPI   Musculoskeletal:       Per HPI, otherwise negative  Skin: Negative.   Neurological: Positive for weakness, light-headedness and numbness. Negative for syncope.     Physical Exam Updated Vital Signs BP (!) 127/91 (BP Location: Left Arm)   Pulse 96   Temp 98.3 F (36.8 C) (Oral)   Resp (!) 22   Ht _0  (1.651 m)   Wt 260 lb (117.9 kg)   LMP 11/27/2012   SpO2 96%   BMI 43.27 kg/m   Physical Exam  Constitutional: She is oriented to person, place, and time. She appears well-developed and well-nourished. No distress.  HENT:  Head: Normocephalic and atraumatic.  Eyes: Conjunctivae and EOM are normal.  Cardiovascular: Normal rate and regular rhythm.   Pulmonary/Chest: Effort normal and breath sounds  normal. No stridor. No respiratory distress.  Left upper chest wall tenderness to palpation, no deformity  Abdominal: She exhibits  no distension.  Minimal tenderness to palpation left upper quadrant, no guarding, no rebound  Musculoskeletal: She exhibits no edema.  Neurological: She is alert and oriented to person, place, and time. No cranial nerve deficit.  Patient describes pain throughout the right leg, winces with mild touch, but has preserved sensation, strength in both extremities.  Skin: Skin is warm and dry.  Psychiatric: Her mood appears anxious.  Nursing note and vitals reviewed.    ED Treatments / Results  Labs (all labs ordered are listed, but only abnormal results are displayed) Labs Reviewed  BASIC METABOLIC PANEL - Abnormal; Notable for the following:       Result Value   Sodium 134 (*)    CO2 21 (*)    Glucose, Bld 231 (*)    All other components within normal limits  CBC  TROPONIN I  I-STAT TROPOININ, ED    EKG  EKG Interpretation  Date/Time:  Sunday Jun 29 2016 18:19:16 EDT Ventricular Rate:  93 PR Interval:    QRS Duration: 86 QT Interval:  343 QTC Calculation: 427 R Axis:   -18 Text Interpretation:  Sinus rhythm Borderline left axis deviation T wave abnormality No significant change since last tracing Borderline ECG Confirmed by Carmin Muskrat [phone]) on 06/29/2016 7:16:45 PM       Radiology Dg Chest 2 View  Result Date: 06/29/2016 CLINICAL DATA:  Acute left chest pain today. EXAM: CHEST  2 VIEW COMPARISON:  06/10/2016 and prior chest radiographs FINDINGS: The cardiomediastinal silhouette is unremarkable. There is no evidence of focal airspace disease, pulmonary edema, suspicious pulmonary nodule/mass, pleural effusion, or pneumothorax. No acute bony abnormalities are identified. IMPRESSION: No active cardiopulmonary disease. Electronically Signed   By: Margarette Canada M.D.   On: 06/29/2016 18:54   Chart review notable for 5 emergency department visits  in 6 months. Procedures Procedures (including critical care time)  Medications Ordered in ED Medications  predniSONE (DELTASONE) tablet 60 mg (not administered)  LORazepam (ATIVAN) injection 1 mg (1 mg Intramuscular Given 06/29/16 1937)  ketorolac (TORADOL) 30 MG/ML injection 15 mg (15 mg Intramuscular Given 06/29/16 1939)     Initial Impression / Assessment and Plan / ED Course  I have reviewed the triage vital signs and the nursing notes.  Pertinent labs & imaging results that were available during my care of the patient were reviewed by me and considered in my medical decision making (see chart for details).  11:22 PM Patient in no distress awake, alert. We discussed all findings including reassuring results, no evidence for ongoing coronary ischemia, with reassuring EKG, no troponin elevation 2, no evidence for pneumonia, pneumothorax, pulmonary embolism, bacteremia, sepsis. With some suspicion for musculoskeletal etiology, and the patient's baseline neuropathy, patient will finish her course of steroids, follow-up as an outpatient.  Final Clinical Impressions(s) / ED Diagnoses   Final diagnoses:  Atypical chest pain  Right leg pain     Carmin Muskrat, MD 06/29/16 2324

## 2016-07-07 ENCOUNTER — Other Ambulatory Visit: Payer: Self-pay

## 2016-07-07 DIAGNOSIS — K625 Hemorrhage of anus and rectum: Secondary | ICD-10-CM

## 2016-07-09 ENCOUNTER — Ambulatory Visit (INDEPENDENT_AMBULATORY_CARE_PROVIDER_SITE_OTHER): Payer: Medicaid Other | Admitting: Family Medicine

## 2016-07-09 ENCOUNTER — Encounter: Payer: Self-pay | Admitting: Family Medicine

## 2016-07-09 ENCOUNTER — Telehealth: Payer: Self-pay | Admitting: Internal Medicine

## 2016-07-09 VITALS — BP 109/70 | HR 106 | Temp 98.4°F | Ht 65.0 in | Wt 260.0 lb

## 2016-07-09 DIAGNOSIS — E1140 Type 2 diabetes mellitus with diabetic neuropathy, unspecified: Secondary | ICD-10-CM

## 2016-07-09 DIAGNOSIS — L819 Disorder of pigmentation, unspecified: Secondary | ICD-10-CM | POA: Diagnosis present

## 2016-07-09 NOTE — Patient Instructions (Signed)
It was nice seeing you today. I am sorry about your skin rash. It might be secondary to the topical steroid use or new onset vitiligo. Please stop using topical steroid for now. Return to [name] in 2-4 week, if there is an improvement it means it is due to the steroid, otherwise we will get biopsy of your skin.  Please talk to your PCP about adjusting your Gabapentin for DM neuropathic pain.

## 2016-07-09 NOTE — Progress Notes (Signed)
Subjective:     Patient ID: Marisa [name], female   DOB: 1975-06-23, 41 y.o.   MRN: [id number]  Rash  This is a chronic (C/O skin discoloration on going for 2 months on her hands and feet B/L) problem. The current episode started more than 1 month ago (2 months ago). The problem has been gradually worsening since onset. The affected locations include the left hand, right hand, left foot and right foot. The rash is characterized by burning and itchiness. Associated with: Rash started 2 months ago, she was given topical steroid with some improvement, although did not completely take away the lesion. She stopped the topical stroid and the lesion worsened again.  Pertinent negatives include no fatigue or joint pain. Past treatments include topical steroids. The treatment provided no relief.  She has family hx of vitiligo in her maternal grandmother.   Neuropathic pain: B/L hands and feet.  Current Outpatient Prescriptions on File Prior to Visit  Medication Sig Dispense Refill  . albuterol (PROVENTIL HFA;VENTOLIN HFA) 108 (90 BASE) MCG/ACT inhaler Inhale 1-2 puffs into the lungs every 6 (six) hours as needed for wheezing or shortness of breath. 1 Inhaler 1  . bacitracin-polymyxin b (POLYSPORIN) ophthalmic ointment Place 1 application into the left eye every 12 (twelve) hours. apply to eye every 12 hours while awake (Patient not taking: Reported on 06/29/2016) 3.5 g 0  . Blood Glucose Monitoring Suppl (ACCU-CHEK AVIVA PLUS) w/Device KIT 1 each by Does not apply route 3 (three) times daily. 1 kit 0  . budesonide-formoterol (SYMBICORT) 160-4.5 MCG/ACT inhaler Inhale 2 puffs into the lungs 2 (two) times daily. 1 Inhaler 12  . dicyclomine (BENTYL) 10 MG capsule Take 1 capsule (10 mg total) by mouth 3 (three) times daily before meals. 90 capsule 0  . ergocalciferol (VITAMIN D2) 50000 units capsule Take 1 capsule (50,000 Units total) by mouth once a week. 12 capsule 3  . famotidine (PEPCID) 20 MG tablet  Take 1 tablet (20 mg total) by mouth 2 (two) times daily. 30 tablet 0  . gabapentin (NEURONTIN) 100 MG capsule Take 1 capsule (100 mg total) by mouth 3 (three) times daily. 90 capsule 3  . glucose blood (ACCU-CHEK AVIVA) test strip Use as instructed 100 each 12  . hydrocortisone (ANUSOL-HC) 25 MG suppository Place 1 suppository (25 mg total) rectally 2 (two) times daily. 30 suppository 3  . ipratropium-albuterol (DUONEB) 0.5-2.5 (3) MG/3ML SOLN Take 3 mLs by nebulization every 6 (six) hours as needed (for wheezing/shortness of breath).    Elmore Guise Devices (ACCU-CHEK SOFTCLIX) lancets Use as instructed 1 each 12  . lithium carbonate (LITHOBID) 300 MG CR tablet Take 1 tablet (300 mg total) by mouth at bedtime. 30 tablet 1  . metFORMIN (GLUCOPHAGE) 500 MG tablet Take 1 tablet (500 mg total) by mouth 2 (two) times daily with a meal. 180 tablet 3  . ondansetron (ZOFRAN) 4 MG tablet Take 1 tablet (4 mg total) by mouth every 6 (six) hours. 12 tablet 0  . predniSONE (DELTASONE) 20 MG tablet Take 2 tablets (40 mg total) by mouth daily with breakfast. For the next four days 8 tablet 0  . sucralfate (CARAFATE) 1 g tablet Take 1 tablet (1 g total) by mouth 4 (four) times daily -  with meals and at bedtime. 20 tablet 0  . SUMAtriptan (IMITREX) 25 MG tablet Take 1 tablet (25 mg total) by mouth once. May repeat in 2 hours if headache persists or recurs. 10 tablet 0  .  triamcinolone ointment (KENALOG) 0.5 % Apply 1 application topically 2 (two) times daily. (Patient not taking: Reported on 06/29/2016) 30 g 0  . TRUEPLUS LANCETS 28G MISC 1 each by Does not apply route 3 (three) times daily. 100 each 12   No current facility-administered medications on file prior to visit.    Past Medical History:  Diagnosis Date  . Anal pain    chronic  . Anemia   . Anxiety   . Asthma    exacerbation 02-28-2014 and 02-23-2014 secondary to Rhinovirus  . Chronic headaches   . Chronic low back pain   . Cyst of right ovary   .  Difficult intravenous access    PER PT NEEDS PICC LINE  . Gait instability   . History of adenomatous polyp of colon   . History of cardiac arrest    during SVD 1992  . History of ectopic pregnancy    2009-  S/P LEFT SALPINGECTOMY  . History of panic attacks   . IBS (irritable bowel syndrome)   . Lumbar stenosis L4 -- L5 with bulging disk   w/ right leg weakness/ decreased mobility  . Mild obstructive sleep apnea    study 03-20-2014  no cpap recommended  . Type 2 diabetes mellitus ([place])   . Weakness of right leg    FROM BACK PROBLEM PER PT   Vitals:   07/09/16 1351  BP: 109/70  Pulse: (!) 106  Temp: 98.4 F (36.9 C)  SpO2: 96%  Weight: 260 lb (117.9 kg)  Height: '5\' 5"'  (1.651 m)      Review of Systems  Constitutional: Negative for fatigue.  Musculoskeletal: Negative for joint pain.  Skin: Positive for rash.  Neurological: Positive for numbness.       Tingling,burning of hands and feet.  All other systems reviewed and are negative.      Objective:   Physical Exam  Constitutional: She appears well-developed. No distress.  Skin: Rash noted. Rash is macular.  Scattered, satellite like hypopigmented macular lesion on her hands, interdigital web and wrist area. Similar lesion on her feet and few inches below her knees.  Nursing note and vitals reviewed.            Assessment:     Hypopigmented skin lesion  DM neuropathic pain    Plan:     Hypopigmented skin lesion Although she thought lesion might be related to her topical steroid cream used. However, upon eliciting hx, she started the cream a month after she first noticed her skin lesion. This might be new onset vitiligo vs autoimmune condition. There is a positive hx of vitiligo in her maternal grandmother. For now she is advised to d/c her topical steroid. Watch for improvement over the next 2-4 weeks. Consider autoimmune screening if no improvement and skin biopsy. Return precaution discussed.  DM  neuropathic pain As discussed with patient, I don't think her burning pain is related to her rash. Pain is likely secondary to DM neuropathy. Her PCP already started her on Gabapentin. She might need to go up on the dose. She will discuss this with her PCP.

## 2016-07-09 NOTE — Telephone Encounter (Signed)
Patient called the office asking to speak with provider regarding her medication, gabapentin (NEURONTIN) 100 MG capsule. Pt stated that it's not working and still in pain. Pt is requesting a higher dosage. Please follow up.  Thank you.

## 2016-07-11 ENCOUNTER — Ambulatory Visit (AMBULATORY_SURGERY_CENTER): Payer: Self-pay | Admitting: *Deleted

## 2016-07-11 VITALS — Ht 65.0 in | Wt 262.0 lb

## 2016-07-11 DIAGNOSIS — K625 Hemorrhage of anus and rectum: Secondary | ICD-10-CM

## 2016-07-11 MED ORDER — NA SULFATE-K SULFATE-MG SULF 17.5-3.13-1.6 GM/177ML PO SOLN: 1.0000 | Freq: Once | ORAL | 0 refills | Status: AC

## 2016-07-11 NOTE — Progress Notes (Signed)
No egg or soy allergy known to patient  No issues with past sedation with any surgeries  or procedures, no intubation problems  No diet pills per patient No home 02 use per patient  No blood thinners per patient  Pt denies issues with constipation  No A fib or A flutter  EMMI video sent to pt's e mail  Pt having colon at [facility] as she is a very difficult IV stick and usually has to have a PICC line for procedures

## 2016-07-15 NOTE — Telephone Encounter (Signed)
Please advise on if a higher dosage of gabapentin is appropriate. Patient complains of medication at current dosage not providing relief.

## 2016-07-15 NOTE — Telephone Encounter (Signed)
Patient can start 300 mg tabs 3 times daily.

## 2016-07-16 NOTE — Telephone Encounter (Signed)
Patient verified DOB Patient is aware of taking three 100 mg tablets three times a day. Patient advised to call 2 days prior to last dosage and request a refill for the new dosing. Patient has surgery scheduled and will contact the office if the new dosing causes any concerns.

## 2016-07-29 ENCOUNTER — Other Ambulatory Visit: Payer: Self-pay

## 2016-07-29 ENCOUNTER — Ambulatory Visit: Payer: Self-pay | Admitting: Family Medicine

## 2016-08-01 ENCOUNTER — Encounter (HOSPITAL_COMMUNITY)
Admission: RE | Disposition: A | Discharge status: Home / Self Care | Payer: Self-pay | Source: Ambulatory Visit | Attending: Gastroenterology

## 2016-08-01 ENCOUNTER — Ambulatory Visit (HOSPITAL_COMMUNITY)
Admission: RE | Admit: 2016-08-01 | Discharge: 2016-08-01 | Disposition: A | Discharge status: Home / Self Care | Payer: Medicaid Other | Source: Ambulatory Visit | Attending: Gastroenterology | Admitting: Gastroenterology

## 2016-08-01 ENCOUNTER — Encounter (HOSPITAL_COMMUNITY): Payer: Self-pay

## 2016-08-01 ENCOUNTER — Ambulatory Visit (HOSPITAL_COMMUNITY): Payer: Medicaid Other | Admitting: Anesthesiology

## 2016-08-01 DIAGNOSIS — D125 Benign neoplasm of sigmoid colon: Secondary | ICD-10-CM | POA: Diagnosis not present

## 2016-08-01 DIAGNOSIS — R1031 Right lower quadrant pain: Secondary | ICD-10-CM

## 2016-08-01 DIAGNOSIS — K635 Polyp of colon: Secondary | ICD-10-CM | POA: Diagnosis not present

## 2016-08-01 DIAGNOSIS — K529 Noninfective gastroenteritis and colitis, unspecified: Secondary | ICD-10-CM | POA: Diagnosis not present

## 2016-08-01 DIAGNOSIS — Z86010 Personal history of colonic polyps: Secondary | ICD-10-CM | POA: Insufficient documentation

## 2016-08-01 DIAGNOSIS — Z7984 Long term (current) use of oral hypoglycemic drugs: Secondary | ICD-10-CM | POA: Diagnosis not present

## 2016-08-01 DIAGNOSIS — K5730 Diverticulosis of large intestine without perforation or abscess without bleeding: Secondary | ICD-10-CM | POA: Insufficient documentation

## 2016-08-01 DIAGNOSIS — D123 Benign neoplasm of transverse colon: Secondary | ICD-10-CM

## 2016-08-01 DIAGNOSIS — R109 Unspecified abdominal pain: Secondary | ICD-10-CM | POA: Insufficient documentation

## 2016-08-01 DIAGNOSIS — K625 Hemorrhage of anus and rectum: Secondary | ICD-10-CM

## 2016-08-01 DIAGNOSIS — Z6841 Body Mass Index (BMI) 40.0 and over, adult: Secondary | ICD-10-CM | POA: Insufficient documentation

## 2016-08-01 DIAGNOSIS — E1140 Type 2 diabetes mellitus with diabetic neuropathy, unspecified: Secondary | ICD-10-CM | POA: Insufficient documentation

## 2016-08-01 DIAGNOSIS — G4733 Obstructive sleep apnea (adult) (pediatric): Secondary | ICD-10-CM | POA: Insufficient documentation

## 2016-08-01 DIAGNOSIS — K640 First degree hemorrhoids: Secondary | ICD-10-CM | POA: Diagnosis not present

## 2016-08-01 HISTORY — PX: COLONOSCOPY WITH PROPOFOL: SHX5780

## 2016-08-01 LAB — GLUCOSE, CAPILLARY: Glucose-Capillary: 107 mg/dL — ABNORMAL HIGH (ref 65–99)

## 2016-08-01 SURGERY — COLONOSCOPY WITH PROPOFOL
Anesthesia: Monitor Anesthesia Care

## 2016-08-01 MED ORDER — LACTATED RINGERS IV SOLN
INTRAVENOUS | Status: DC | PRN
  Administered 2016-08-01: 08:00:00 via INTRAVENOUS

## 2016-08-01 MED ORDER — PROPOFOL 500 MG/50ML IV EMUL
INTRAVENOUS | Status: DC | PRN
  Administered 2016-08-01: 100 ug/kg/min via INTRAVENOUS

## 2016-08-01 MED ORDER — ALBUTEROL SULFATE (2.5 MG/3ML) 0.083% IN NEBU
2.5000 mg | INHALATION_SOLUTION | Freq: Once | RESPIRATORY_TRACT | Status: AC
  Administered 2016-08-01: 2.5 mg via RESPIRATORY_TRACT

## 2016-08-01 MED ORDER — PROPOFOL 10 MG/ML IV BOLUS
INTRAVENOUS | Status: DC | PRN
  Administered 2016-08-01: 40 mg via INTRAVENOUS
  Administered 2016-08-01: 10 mg via INTRAVENOUS
  Administered 2016-08-01 (×2): 20 mg via INTRAVENOUS

## 2016-08-01 MED ORDER — LIDOCAINE 2% (20 MG/ML) 5 ML SYRINGE
INTRAMUSCULAR | Status: DC | PRN
  Administered 2016-08-01: 80 mg via INTRAVENOUS

## 2016-08-01 MED ORDER — PROPOFOL 10 MG/ML IV BOLUS
INTRAVENOUS | Status: AC
  Filled 2016-08-01: qty 40

## 2016-08-01 MED ORDER — ALBUTEROL SULFATE (2.5 MG/3ML) 0.083% IN NEBU
INHALATION_SOLUTION | RESPIRATORY_TRACT | Status: AC
  Filled 2016-08-01: qty 3

## 2016-08-01 MED ORDER — LIDOCAINE 2% (20 MG/ML) 5 ML SYRINGE
INTRAMUSCULAR | Status: AC
  Filled 2016-08-01: qty 5

## 2016-08-01 MED ORDER — SODIUM CHLORIDE 0.9 % IV SOLN: INTRAVENOUS | Status: DC

## 2016-08-01 SURGICAL SUPPLY — 21 items

## 2016-08-01 NOTE — Anesthesia Postprocedure Evaluation (Signed)
Anesthesia Post Note  Patient: Marisa [name]  Procedure(s) Performed: Procedure(s) (LRB): COLONOSCOPY WITH PROPOFOL (N/A)     Patient location during evaluation: PACU Anesthesia Type: MAC Level of consciousness: awake and alert Pain management: pain level controlled Vital Signs Assessment: post-procedure vital signs reviewed and stable Respiratory status: spontaneous breathing, nonlabored ventilation and respiratory function stable Cardiovascular status: stable and blood pressure returned to baseline Anesthetic complications: no    Last Vitals:  Vitals:   08/01/16 1040 08/01/16 1041  BP: (!) 141/90   Pulse: 76 75  Resp: 19 18  Temp:      Last Pain:  Vitals:   08/01/16 1000  TempSrc: Oral                 Lynda Rainwater

## 2016-08-01 NOTE — Interval H&P Note (Signed)
History and Physical Interval Note:  08/01/2016 9:21 AM  Marisa [name]  has presented today for surgery, with the diagnosis of RECTAL BLEEDING  The various methods of treatment have been discussed with the patient and family. After consideration of risks, benefits and other options for treatment, the patient has consented to  Procedure(s): COLONOSCOPY WITH PROPOFOL (N/A) as a surgical intervention .  The patient's history has been reviewed, patient examined, no change in status, stable for surgery.  I have reviewed the patient's chart and labs.  Questions were answered to the patient's satisfaction.     [name]

## 2016-08-01 NOTE — Anesthesia Preprocedure Evaluation (Signed)
Anesthesia Evaluation  Patient identified by MRN, date of birth, ID band Patient awake    Reviewed: Allergy & Precautions, NPO status , Patient's Chart, lab work & pertinent test results  Airway Mallampati: II  TM Distance: >3 FB Neck ROM: Full    Dental no notable dental hx.    Pulmonary shortness of breath, asthma , sleep apnea , Current Smoker, former smoker,    Pulmonary exam normal breath sounds clear to auscultation       Cardiovascular negative cardio ROS Normal cardiovascular exam Rhythm:Regular Rate:Normal     Neuro/Psych  Headaches, PSYCHIATRIC DISORDERS Anxiety Depression    GI/Hepatic negative GI ROS, Neg liver ROS,   Endo/Other  diabetes, Type 2, Oral Hypoglycemic AgentsMorbid obesity  Renal/GU negative Renal ROS  negative genitourinary   Musculoskeletal negative musculoskeletal ROS (+)   Abdominal (+) + obese,   Peds negative pediatric ROS (+)  Hematology  (+) anemia ,   Anesthesia Other Findings   Reproductive/Obstetrics negative OB ROS                             Anesthesia Physical  Anesthesia Plan  ASA: III  Anesthesia Plan: MAC   Post-op Pain Management:    Induction: Intravenous  PONV Risk Score and Plan: 1 and Ondansetron and Midazolam  Airway Management Planned:   Additional Equipment:   Intra-op Plan:   Post-operative Plan:   Informed Consent: I have reviewed the patients History and Physical, chart, labs and discussed the procedure including the risks, benefits and alternatives for the proposed anesthesia with the patient or authorized representative who has indicated his/her understanding and acceptance.   Dental advisory given  Plan Discussed with: CRNA  Anesthesia Plan Comments:         Anesthesia Quick Evaluation

## 2016-08-01 NOTE — Progress Notes (Signed)
Pt resting comfortably and states that her breathing is better and feels good.

## 2016-08-01 NOTE — Progress Notes (Signed)
Pt feeling winded and short of breath with moving likes she needs her inhaler. Dr. Sabra Heck made aware. Using Alberterol at home. Order for albuterol nebulizer.

## 2016-08-01 NOTE — Progress Notes (Signed)
Pt states she feels better after nebulizer. Will continue to assess

## 2016-08-01 NOTE — Discharge Instructions (Signed)
YOU HAD AN ENDOSCOPIC PROCEDURE TODAY: Refer to the procedure report and other information in the discharge instructions given to you for any specific questions about what was found during the examination. If this information does not answer your questions, please call [place] office at [phone] to clarify.  ° °YOU SHOULD EXPECT: Some feelings of bloating in the abdomen. Passage of more gas than usual. Walking can help get rid of the air that was put into your GI tract during the procedure and reduce the bloating. If you had a lower endoscopy (such as a colonoscopy or flexible sigmoidoscopy) you may notice spotting of blood in your stool or on the toilet paper. Some abdominal soreness may be present for a day or two, also. ° °DIET: Your first meal following the procedure should be a light meal and then it is ok to progress to your normal diet. A half-sandwich or bowl of soup is an example of a good first meal. Heavy or fried foods are harder to digest and may make you feel nauseous or bloated. Drink plenty of fluids but you should avoid alcoholic beverages for 24 hours. If you had a esophageal dilation, please see attached instructions for diet.   ° °ACTIVITY: Your care partner should take you home directly after the procedure. You should plan to take it easy, moving slowly for the rest of the day. You can resume normal activity the day after the procedure however YOU SHOULD NOT DRIVE, use power tools, machinery or perform tasks that involve climbing or major physical exertion for 24 hours (because of the sedation medicines used during the test).  ° °SYMPTOMS TO REPORT IMMEDIATELY: °A gastroenterologist can be reached at any hour. Please call [phone]  for any of the following symptoms:  °Following lower endoscopy (colonoscopy, flexible sigmoidoscopy) °Excessive amounts of blood in the stool  °Significant tenderness, worsening of abdominal pains  °Swelling of the abdomen that is new, acute  °Fever of 100° or  higher  °Following upper endoscopy (EGD, EUS, ERCP, esophageal dilation) °Vomiting of blood or coffee ground material  °New, significant abdominal pain  °New, significant chest pain or pain under the shoulder blades  °Painful or persistently difficult swallowing  °New shortness of breath  °Black, tarry-looking or red, bloody stools ° °FOLLOW UP:  °If any biopsies were taken you will be contacted by phone or by letter within the next 1-3 weeks. Call [phone]  if you have not heard about the biopsies in 3 weeks.  °Please also call with any specific questions about appointments or follow up tests. ° °

## 2016-08-01 NOTE — Transfer of Care (Signed)
Immediate Anesthesia Transfer of Care Note  Patient: Marisa [name]  Procedure(s) Performed: Procedure(s): COLONOSCOPY WITH PROPOFOL (N/A)  Patient Location: PACU  Anesthesia Type:MAC  Level of Consciousness: Patient easily awoken, sedated, comfortable, cooperative, following commands, responds to stimulation.   Airway & Oxygen Therapy: Patient spontaneously breathing, ventilating well, oxygen via simple oxygen mask.  Post-op Assessment: Report given to PACU RN, vital signs reviewed and stable, moving all extremities.   Post vital signs: Reviewed and stable.  Complications: No apparent anesthesia complications Last Vitals:  Vitals:   08/01/16 0802  BP: 112/83  Pulse: 90  Resp: 20  Temp: 36.4 C    Last Pain:  Vitals:   08/01/16 0802  TempSrc: Oral         Complications: No apparent anesthesia complications

## 2016-08-01 NOTE — Op Note (Signed)
[facility] Patient Name: Marisa [name] Procedure Date: 08/01/2016 MRN: [id number] Attending MD: Estill Cotta. Loletha Carrow , MD Date of Birth: 12-Mar-1975 CSN: [id number] Age: 41 Admit Type: Outpatient Procedure:                Colonoscopy Indications:              Abdominal pain in the right lower quadrant, Chronic                            diarrhea, Rectal bleeding Providers:                Mallie Mussel L. Loletha Carrow, MD, Laverta Baltimore RN, RN, Tinnie Gens, Technician Referring MD:             Angelica Chessman, MD Medicines:                Monitored Anesthesia Care Complications:            No immediate complications. Estimated Blood Loss:     Estimated blood loss was minimal. Procedure:                Pre-Anesthesia Assessment:                           - Prior to the procedure, a History and Physical                            was performed, and patient medications and                            allergies were reviewed. The patient's tolerance of                            previous anesthesia was also reviewed. The risks                            and benefits of the procedure and the sedation                            options and risks were discussed with the patient.                            All questions were answered, and informed consent                            was obtained. Prior Anticoagulants: The patient has                            taken no previous anticoagulant or antiplatelet                            agents. ASA Grade Assessment: III - A patient with  severe systemic disease. After reviewing the risks                            and benefits, the patient was deemed in                            satisfactory condition to undergo the procedure.                           After obtaining informed consent, the colonoscope                            was passed under direct vision. Throughout the                procedure, the patient's blood pressure, pulse, and                            oxygen saturations were monitored continuously. The                            EC-[id number] ([id number]) scope was introduced through                            the anus and advanced to the the terminal ileum.                            The colonoscopy was performed with moderate                            difficulty due to significant looping. Successful                            completion of the procedure was aided by using                            manual pressure. The quality of the bowel                            preparation was good. The terminal ileum, ileocecal                            valve, appendiceal orifice, and rectum were                            photographed. The bowel preparation used was                            SUPREP. The quality of the bowel preparation was                            evaluated using the BBPS [facility] Bowel Preparation                            Scale) with scores of: Right  Colon = 2, Transverse                            Colon = 2 and Left Colon = 2. The total BBPS score                            equals 6. Scope In: 9:40:38 AM Scope Out: 9:54:05 AM Scope Withdrawal Time: 0 hours 9 minutes 15 seconds  Total Procedure Duration: 0 hours 13 minutes 27 seconds  Findings:      The perianal and digital rectal examinations were normal.      The terminal ileum appeared normal.      Normal mucosa was found in the entire colon.      Two sessile polyps were found in the distal sigmoid colon and mid       transverse colon. The polyps were 4 mm in size. These polyps were       removed with a cold snare. Resection and retrieval were complete.      A few small-mouthed diverticula were found in the sigmoid colon.      Internal hemorrhoids were found during retroflexion and during anoscopy.       The hemorrhoids were small and Grade I (internal hemorrhoids that do not        prolapse).      The exam was otherwise without abnormality on direct and retroflexion       views. Impression:               - The examined portion of the ileum was normal.                           - Normal mucosa in the entire examined colon.                           - Two 4 mm polyps in the distal sigmoid colon and                            in the mid transverse colon, removed with a cold                            snare. Resected and retrieved.                           - Diverticulosis in the sigmoid colon.                           - Internal hemorrhoids. - source of bleeding.                           - The examination was otherwise normal on direct                            and retroflexion views. Moderate Sedation:      MAC sedation used Recommendation:           - Patient has a contact number available for  emergencies. The signs and symptoms of potential                            delayed complications were discussed with the                            patient. Return to normal activities tomorrow.                            Written discharge instructions were provided to the                            patient.                           - Resume previous diet.                           - Continue present medications, including                            dicyclomine for IBS.                           - Await pathology results.                           - OTC preparation H suppository twice daily as                            needed for bleeding.                           - Repeat colonoscopy is recommended for                            surveillance. The colonoscopy date will be                            determined after pathology results from today's                            exam become available for review. Procedure Code(s):        --- Professional ---                           [phone], Colonoscopy, flexible; with removal of                             tumor(s), polyp(s), or other lesion(s) by snare                            technique Diagnosis Code(s):        --- Professional ---                           K64.0, First degree hemorrhoids  D12.5, Benign neoplasm of sigmoid colon                           D12.3, Benign neoplasm of transverse colon (hepatic                            flexure or splenic flexure)                           R10.31, Right lower quadrant pain                           K52.9, Noninfective gastroenteritis and colitis,                            unspecified                           K62.5, Hemorrhage of anus and rectum                           K57.30, Diverticulosis of large intestine without                            perforation or abscess without bleeding CPT copyright 2016 [employer]. All rights reserved. The codes documented in this report are preliminary and upon coder review may  be revised to meet current compliance requirements. Damary Doland L. Loletha Carrow, MD 08/01/2016 10:04:01 AM This report has been signed electronically. Number of Addenda: 0

## 2016-08-01 NOTE — H&P (Signed)
History:  This patient presents for endoscopic testing for abdominal pain, diarrhea and rectal bleeding.  Marisa [name] Referring physician: Tresa Garter, MD  Past Medical History: Past Medical History:  Diagnosis Date  . Allergy   . Anal pain    chronic  . Anemia   . Anxiety   . Asthma    exacerbation 02-28-2014 and 02-23-2014 secondary to Rhinovirus  . Chronic headaches   . Chronic low back pain   . Cyst of right ovary   . Difficult intravenous access    PER PT NEEDS PICC LINE  . Gait instability   . GERD (gastroesophageal reflux disease)   . History of adenomatous polyp of colon   . History of cardiac arrest    during SVD 1992  . History of ectopic pregnancy    2009-  S/P LEFT SALPINGECTOMY  . History of panic attacks   . IBS (irritable bowel syndrome)   . Lumbar stenosis L4 -- L5 with bulging disk   w/ right leg weakness/ decreased mobility  . Mild obstructive sleep apnea    study 03-20-2014  no cpap recommended  . Neuromuscular disorder (HCC)    neuropathy in feet   . Sleep apnea    mild no cpap  . Type 2 diabetes mellitus ([place])   . Weakness of right leg    FROM BACK PROBLEM PER PT     Past Surgical History: Past Surgical History:  Procedure Laterality Date  . ABDOMINAL HYSTERECTOMY    . COLONOSCOPY Left 04/29/2013   Procedure: COLONOSCOPY;  Surgeon: Arta Silence, MD;  Location: [facility];  Service: Endoscopy;  Laterality: Left;  . COLONOSCOPY    . EVALUATION UNDER ANESTHESIA WITH FISTULECTOMY N/A 04/20/2014   Procedure: EXAM UNDER ANESTHESIA ;  Surgeon: Leighton Ruff, MD;  Location: [facility];  Service: General;  Laterality: N/A;  . FLEXIBLE SIGMOIDOSCOPY N/A 11/09/2013   Procedure: FLEXIBLE SIGMOIDOSCOPY;  Surgeon: Arta Silence, MD;  Location: [facility];  Service: Endoscopy;  Laterality: N/A;  . LAPAROSCOPIC CHOLECYSTECTOMY  2005  . SPHINCTEROTOMY N/A 04/20/2014   Procedure:  LATERAL INTERNAL SPHINCTEROTOMY;   Surgeon: Leighton Ruff, MD;  Location: [facility];  Service: General;  Laterality: N/A;  . TRANSTHORACIC ECHOCARDIOGRAM  12-30-2012   mild LVH/  ef 55-60%  . UNILATERAL SALPINGECTOMY  2009   laparotomy left salpingectomy-- ectopic preg.  Marland Kitchen UPPER GASTROINTESTINAL ENDOSCOPY    . VAGINAL HYSTERECTOMY N/A 01/06/2013   Procedure: HYSTERECTOMY VAGINAL;  Surgeon: Osborne Oman, MD;  Location: [facility];  Service: Gynecology;  Laterality: N/A;    Allergies: Allergies  Allergen Reactions  . Asa [Aspirin] Anaphylaxis    Hives, chest tightness   . Mushroom Extract Complex Anaphylaxis, Swelling and Other (See Comments)    Reaction:  Eye swelling  . Penicillins Anaphylaxis and Other (See Comments)    Has patient had a PCN reaction causing immediate rash, facial/tongue/throat swelling, SOB or lightheadedness with hypotension: Yes Has patient had a PCN reaction causing severe rash involving mucus membranes or skin necrosis: No Has patient had a PCN reaction that required hospitalization No Has patient had a PCN reaction occurring within the last 10 years: No If all of the above answers are "NO", then may proceed with Cephalosporin use.  . Shellfish Allergy Anaphylaxis  . Triamcinolone Other (See Comments)    Skin issues     Outpatient Meds: Current Facility-Administered Medications  Medication Dose Route Frequency Provider Last Rate Last Dose  . 0.9 %  sodium chloride infusion   Intravenous Continuous [name], MD          ___________________________________________________________________ Objective   Exam:  BP 112/83   Pulse 90   Temp 97.6 F (36.4 C) (Oral)   Resp 20   Ht 5\' 5"  (1.651 m)   Wt 254 lb (115.2 kg)   LMP 11/27/2012   SpO2 98%   BMI 42.27 kg/m    CV: RRR without murmur, S1/S2, no JVD, no peripheral edema  Resp: clear to auscultation bilaterally, normal RR and effort noted  GI: soft, no tenderness, with active bowel sounds. No guarding  or palpable organomegaly noted.  Neuro: awake, alert and oriented x 3. Normal gross motor function and fluent speech   Assessment:  Abd pain, rectal bleeding and diarrhea  Plan:  Colonoscopy   [name]

## 2016-08-04 ENCOUNTER — Encounter: Payer: Self-pay | Admitting: Gastroenterology

## 2016-08-04 ENCOUNTER — Encounter (HOSPITAL_COMMUNITY): Payer: Self-pay | Admitting: Gastroenterology

## 2016-08-13 ENCOUNTER — Ambulatory Visit: Payer: Self-pay | Admitting: Internal Medicine

## 2016-08-13 ENCOUNTER — Ambulatory Visit: Payer: Medicaid Other

## 2016-08-13 NOTE — Progress Notes (Deleted)
   Subjective:   Patient ID: Marisa [name]    DOB: [birth year], 41 y.o. female   MRN: [id number]  CC: "***"  HPI: Marisa [name] is a 41 y.o. female who presents to clinic today ***. Problems discussed today are as follows:  ***: *** ROS: ***  Complete ROS performed, see HPI for pertinent.  [place]: NIDDM, OSA, migraines, asthma, PTSD, GAD with agoraphobia, tobacco use disorder. Smoking status reviewed. H/o vaginal hysterectomy with left salpingectomy, laparoscopic cholecystectomy. Mother h/o HTN, DM, heart disease, clotting disorder, father h/o HLD, HTN, colon cancer, sister h/p schizophrenia, bipolar, clotting disorder. Medications reviewed.  Objective:   LMP 11/27/2012  Vitals and nursing note reviewed.  General: well nourished, well developed, in no acute distress with non-toxic appearance HEENT: normocephalic, atraumatic, moist mucous membranes Neck: supple, non-tender without lymphadenopathy CV: regular rate and rhythm without murmurs, rubs, or gallops, no lower extremity edema Lungs: clear to auscultation bilaterally with normal work of breathing Abdomen: soft, non-tender, non-distended, no masses or organomegaly palpable, normoactive bowel sounds Skin: warm, dry, no rashes or lesions, cap refill < 2 seconds Extremities: warm and well perfused, normal tone  Assessment & Plan:   No problem-specific Assessment & Plan notes found for this encounter.  No orders of the defined types were placed in this encounter.  No orders of the defined types were placed in this encounter.   This note has been created with Surveyor, quantity. Any transcriptional errors are unintentional.  Harriet [place], [place], PGY-1 08/13/2016 1:43 PM

## 2016-08-27 ENCOUNTER — Ambulatory Visit: Payer: Self-pay | Admitting: Internal Medicine

## 2016-09-04 ENCOUNTER — Emergency Department (HOSPITAL_COMMUNITY)
Admission: EM | Admit: 2016-09-04 | Discharge: 2016-09-04 | Disposition: A | Discharge status: Home / Self Care | Payer: Medicaid Other | Attending: Emergency Medicine | Admitting: Emergency Medicine

## 2016-09-04 ENCOUNTER — Encounter (HOSPITAL_COMMUNITY): Payer: Self-pay | Admitting: Emergency Medicine

## 2016-09-04 DIAGNOSIS — D649 Anemia, unspecified: Secondary | ICD-10-CM | POA: Diagnosis not present

## 2016-09-04 DIAGNOSIS — J45909 Unspecified asthma, uncomplicated: Secondary | ICD-10-CM | POA: Insufficient documentation

## 2016-09-04 DIAGNOSIS — E119 Type 2 diabetes mellitus without complications: Secondary | ICD-10-CM | POA: Diagnosis not present

## 2016-09-04 DIAGNOSIS — Y929 Unspecified place or not applicable: Secondary | ICD-10-CM | POA: Insufficient documentation

## 2016-09-04 DIAGNOSIS — F17210 Nicotine dependence, cigarettes, uncomplicated: Secondary | ICD-10-CM | POA: Diagnosis not present

## 2016-09-04 DIAGNOSIS — Y999 Unspecified external cause status: Secondary | ICD-10-CM | POA: Insufficient documentation

## 2016-09-04 DIAGNOSIS — Y9389 Activity, other specified: Secondary | ICD-10-CM | POA: Diagnosis not present

## 2016-09-04 DIAGNOSIS — W458XXA Other foreign body or object entering through skin, initial encounter: Secondary | ICD-10-CM | POA: Diagnosis not present

## 2016-09-04 DIAGNOSIS — Z79899 Other long term (current) drug therapy: Secondary | ICD-10-CM | POA: Diagnosis not present

## 2016-09-04 DIAGNOSIS — S0501XA Injury of conjunctiva and corneal abrasion without foreign body, right eye, initial encounter: Secondary | ICD-10-CM | POA: Diagnosis not present

## 2016-09-04 DIAGNOSIS — Z7983 Long term (current) use of bisphosphonates: Secondary | ICD-10-CM | POA: Insufficient documentation

## 2016-09-04 DIAGNOSIS — Z7984 Long term (current) use of oral hypoglycemic drugs: Secondary | ICD-10-CM | POA: Diagnosis not present

## 2016-09-04 DIAGNOSIS — H5711 Ocular pain, right eye: Secondary | ICD-10-CM | POA: Diagnosis present

## 2016-09-04 MED ORDER — POLYMYXIN B-TRIMETHOPRIM 10000-0.1 UNIT/ML-% OP SOLN
1.0000 [drp] | OPHTHALMIC | Status: DC
  Administered 2016-09-04: 1 [drp] via OPHTHALMIC
  Filled 2016-09-04: qty 10

## 2016-09-04 MED ORDER — FLUORESCEIN SODIUM 0.6 MG OP STRP
1.0000 | ORAL_STRIP | Freq: Once | OPHTHALMIC | Status: AC
  Administered 2016-09-04: 1 via OPHTHALMIC
  Filled 2016-09-04: qty 1

## 2016-09-04 MED ORDER — DIAZEPAM 5 MG PO TABS
5.0000 mg | ORAL_TABLET | Freq: Once | ORAL | Status: AC
  Administered 2016-09-04: 5 mg via ORAL
  Filled 2016-09-04: qty 1

## 2016-09-04 MED ORDER — OXYCODONE-ACETAMINOPHEN 5-325 MG PO TABS
1.0000 | ORAL_TABLET | Freq: Once | ORAL | Status: AC
  Administered 2016-09-04: 1 via ORAL
  Filled 2016-09-04: qty 1

## 2016-09-04 MED ORDER — KETOROLAC TROMETHAMINE 0.5 % OP SOLN: 1.0000 [drp] | Freq: Four times a day (QID) | OPHTHALMIC | 0 refills | Status: DC

## 2016-09-04 MED ORDER — IBUPROFEN 200 MG PO TABS
600.0000 mg | ORAL_TABLET | Freq: Once | ORAL | Status: AC
  Administered 2016-09-04: 600 mg via ORAL
  Filled 2016-09-04: qty 1

## 2016-09-04 MED ORDER — TETRACAINE HCL 0.5 % OP SOLN
1.0000 [drp] | Freq: Once | OPHTHALMIC | Status: AC
  Administered 2016-09-04: 1 [drp] via OPHTHALMIC
  Filled 2016-09-04: qty 4

## 2016-09-04 NOTE — ED Triage Notes (Signed)
Pt sts right eye pain and swelling with drainage x 3 days; pt sts photophobia

## 2016-09-04 NOTE — ED Provider Notes (Signed)
[facility] DEPT Provider Note   CSN: [id number] Arrival date & time: 09/04/16  1402     History   Chief Complaint Chief Complaint  Patient presents with  . Eye Pain    HPI Marisa [name] is a 41 y.o. female.  HPI   41yF with eye pain. Onset 3 days ago. Initially R eye felt itchy and like she may have some dirt in it. Doesn't remember specific trauma. Kept rubbing it. Symptoms progressed to pain, mild eyelid swelling, photophobia and drainage. Woke up this morning and eye was matted shut and now L eye also feels irritated. She is not sure if vision is blurred because she is having such a hard time keeping them open. She says she hasn't worn contacts in 8 months. No fever. Tried ibuprofen with no improvement.   Past Medical History:  Diagnosis Date  . Allergy   . Anal pain    chronic  . Anemia   . Anxiety   . Asthma    exacerbation 02-28-2014 and 02-23-2014 secondary to Rhinovirus  . Chronic headaches   . Chronic low back pain   . Cyst of right ovary   . Difficult intravenous access    PER PT NEEDS PICC LINE  . Gait instability   . GERD (gastroesophageal reflux disease)   . History of adenomatous polyp of colon   . History of cardiac arrest    during SVD 1992  . History of ectopic pregnancy    2009-  S/P LEFT SALPINGECTOMY  . History of panic attacks   . IBS (irritable bowel syndrome)   . Lumbar stenosis L4 -- L5 with bulging disk   w/ right leg weakness/ decreased mobility  . Mild obstructive sleep apnea    study 03-20-2014  no cpap recommended  . Neuromuscular disorder (HCC)    neuropathy in feet   . Sleep apnea    mild no cpap  . Type 2 diabetes mellitus ([place])   . Weakness of right leg    FROM BACK PROBLEM PER PT    Patient Active Problem List   Diagnosis Date Noted  . RLQ abdominal pain   . Chronic diarrhea   . Benign neoplasm of transverse colon   . Benign neoplasm of sigmoid colon   . Neuropathic pain of both legs 06/04/2016  .  Carbuncle of labium 07/12/2015  . Rash and nonspecific skin eruption 07/12/2015  . Dandruff 03/26/2015  . Nipple discharge in female 03/26/2015  . Migraine variant with headache 05/09/2014  . Unable to ambulate 05/09/2014  . Severe recurrent major depressive disorder with psychotic features ([place]) 05/04/2014  . GAD (generalized anxiety disorder) 05/04/2014  . 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  . Social anxiety disorder 05/04/2014  . PTSD (post-traumatic stress disorder) 05/04/2014  . Cigarette nicotine dependence without complication 52/77/8242  . Gait disturbance 04/11/2014  . Depression 03/27/2014  . Falls 03/27/2014  . OSA (obstructive sleep apnea) 03/06/2014  . Insomnia 03/06/2014  . Anxiety   . History of cardiac arrest   . Acute bronchitis   . Asthma exacerbation 02/20/2014  . Well controlled type 2 diabetes mellitus ([place]) 02/20/2014  . Sore throat 02/20/2014  . Chest pain 02/20/2014  . Tachycardia 02/20/2014  . Diabetes mellitus without complication ([place]) 35/36/1443  . Pelvic pain 07/25/2013  . Rectal bleeding 07/25/2013  . Persistent vomiting 04/27/2013  . Atypical chest pain 04/26/2013  . Abdominal pain 04/26/2013  . S/P Total vaginal hysterectomy on 01/06/13 01/06/2013  . Dyspnea 09/07/2012  .  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  . Anemia 07/30/2012  . Current smoker 07/30/2012    Past Surgical History:  Procedure Laterality Date  . ABDOMINAL HYSTERECTOMY    . COLONOSCOPY Left 04/29/2013   Procedure: COLONOSCOPY;  Surgeon: Arta Silence, MD;  Location: [facility];  Service: Endoscopy;  Laterality: Left;  . COLONOSCOPY    . COLONOSCOPY WITH PROPOFOL N/A 08/01/2016   Procedure: COLONOSCOPY WITH PROPOFOL;  Surgeon: Doran Stabler, MD;  Location: [facility];  Service: Gastroenterology;  Laterality: N/A;  . EVALUATION UNDER ANESTHESIA WITH FISTULECTOMY N/A 04/20/2014   Procedure: EXAM UNDER ANESTHESIA ;  Surgeon: Leighton Ruff, MD;  Location: [facility];  Service: General;  Laterality: N/A;  . FLEXIBLE SIGMOIDOSCOPY N/A 11/09/2013   Procedure: FLEXIBLE SIGMOIDOSCOPY;  Surgeon: Arta Silence, MD;  Location: [facility];  Service: Endoscopy;  Laterality: N/A;  . LAPAROSCOPIC CHOLECYSTECTOMY  2005  . SPHINCTEROTOMY N/A 04/20/2014   Procedure:  LATERAL INTERNAL SPHINCTEROTOMY;  Surgeon: Leighton Ruff, MD;  Location: [facility];  Service: General;  Laterality: N/A;  . TRANSTHORACIC ECHOCARDIOGRAM  12-30-2012   mild LVH/  ef 55-60%  . UNILATERAL SALPINGECTOMY  2009   laparotomy left salpingectomy-- ectopic preg.  Marland Kitchen UPPER GASTROINTESTINAL ENDOSCOPY    . VAGINAL HYSTERECTOMY N/A 01/06/2013   Procedure: HYSTERECTOMY VAGINAL;  Surgeon: Osborne Oman, MD;  Location: [facility];  Service: Gynecology;  Laterality: N/A;    OB History    Gravida Para Term Preterm AB Living   _0 SAB TAB Ectopic Multiple Live Births     1 1           Home Medications    Prior to Admission medications   Medication Sig Start Date End Date Taking? Authorizing Provider  albuterol (PROVENTIL HFA;VENTOLIN HFA) 108 (90 BASE) MCG/ACT inhaler Inhale 1-2 puffs into the lungs every 6 (six) hours as needed for wheezing or shortness of breath. 02/23/14   Robbie Lis, MD  Blood Glucose Monitoring Suppl (ACCU-CHEK AVIVA PLUS) w/Device KIT 1 each by Does not apply route 3 (three) times daily. 04/16/16   Tresa Garter, MD  dicyclomine (BENTYL) 10 MG capsule Take 1 capsule (10 mg total) by mouth 3 (three) times daily before meals. Patient not taking: Reported on 07/29/2016 05/14/16   Doran Stabler, MD  ergocalciferol (VITAMIN D2) 50000 units capsule Take 1 capsule (50,000 Units total) by mouth once a week. Patient taking differently: Take 50,000 Units by mouth every Thursday.  06/10/16   Tresa Garter, MD  famotidine (PEPCID) 20 MG tablet Take 1 tablet (20 mg total) by mouth 2 (two) times daily. Patient taking differently:  Take 20 mg by mouth daily.  06/11/16   Kirichenko, Tatyana, PA-C  gabapentin (NEURONTIN) 100 MG capsule Take 1 capsule (100 mg total) by mouth 3 (three) times daily. Patient not taking: Reported on 07/29/2016 06/04/16   Tresa Garter, MD  gabapentin (NEURONTIN) 300 MG capsule Take 300 mg by mouth 3 (three) times daily.    [provider]  glucose blood (ACCU-CHEK AVIVA) test strip Use as instructed 04/16/16   Tresa Garter, MD  Lancet Devices (ACCU-CHEK [facility]) lancets Use as instructed 04/16/16   Tresa Garter, MD  metFORMIN (GLUCOPHAGE) 500 MG tablet Take 1 tablet (500 mg total) by mouth 2 (two) times daily with a meal. Patient taking differently: Take 500 mg by mouth 3 (three) times daily after meals.  04/16/16  Tresa Garter, MD  mometasone-formoterol (DULERA) 100-5 MCG/ACT AERO Inhale 2 puffs into the lungs 3 (three) times daily.    [provider]  ondansetron (ZOFRAN) 4 MG tablet Take 1 tablet (4 mg total) by mouth every 6 (six) hours. Patient not taking: Reported on 07/29/2016 02/02/16   Orpah Greek, MD  promethazine (PHENERGAN) 25 MG tablet Take 25 mg by mouth every 6 (six) hours as needed for nausea or vomiting.    [provider]  sucralfate (CARAFATE) 1 g tablet Take 1 tablet (1 g total) by mouth 4 (four) times daily -  with meals and at bedtime. 06/11/16   Kirichenko, Lahoma Rocker, PA-C  SUMAtriptan (IMITREX) 25 MG tablet Take 1 tablet (25 mg total) by mouth once. May repeat in 2 hours if headache persists or recurs. Patient not taking: Reported on 07/29/2016 04/16/16 04/16/16  Tresa Garter, MD  TRUEPLUS LANCETS 28G MISC 1 each by Does not apply route 3 (three) times daily. 04/16/16   Tresa Garter, MD    Family History Family History  Problem Relation Age of Onset  . Hypertension Mother   . Diabetes Mother   . Allergies Mother   . Heart disease Mother   . Clotting disorder Mother   . Cancer Father   .  Hyperlipidemia Father   . Hypertension Father   . Colon cancer Father   . Heart disease Maternal Grandmother   . Schizophrenia Sister   . Bipolar disorder Sister   . Clotting disorder Sister   . Bipolar disorder Brother   . Kidney disease Brother   . Bipolar disorder Sister   . Pancreatic cancer Maternal Aunt   . Prostate cancer Maternal Uncle   . Liver cancer Maternal Grandfather   . Rectal cancer Maternal Grandfather   . Liver cancer Paternal Grandfather   . Colon polyps Neg Hx   . Esophageal cancer Neg Hx   . Stomach cancer Neg Hx     Social History Social History  Substance Use Topics  . Smoking status: Current Some Day Smoker    Packs/day: 0.20    Years: 11.00    Types: Cigarettes    Last attempt to quit: 11/17/2013  . Smokeless tobacco: Never Used     Comment: 1 pack lasts 3 weeks   . Alcohol use No     Allergies   Asa [aspirin]; Mushroom extract complex; Penicillins; Shellfish allergy; and Triamcinolone   Review of Systems Review of Systems  All systems reviewed and negative, other than as noted in HPI.  Physical Exam Updated Vital Signs BP (!) 129/91 (BP Location: Right Arm)   Pulse 91   Temp 98 F (36.7 C) (Oral)   Resp 17   LMP 11/27/2012   SpO2 97%   Physical Exam  Constitutional: She appears well-developed and well-nourished. No distress.  HENT:  Head: Normocephalic and atraumatic.  Eyes:  Mild swelling R upper eye lid. Direct and consensual photophobia R eye. Pupils symmetric. Response R pupil perhaps a little sluggish (?) but hard to tell with the degree of photophobia she has. B/l conjunctival injection. Tearing R eye which doesn't look grossly purulent. EOMI but reports pain with movement. No proptosis.   Pain relief with tetracaine. IOP OD: 12. OS: 14. There is focal area of fluorescein uptake over cornea at ~7-8 o'clock. Looks almost like a small smudge. Abrasion? Ulcer? L eye fine.   Neck: Neck supple.  Cardiovascular: Normal rate,  regular rhythm and normal heart sounds.  Exam reveals no gallop and no friction rub.   No murmur heard. Pulmonary/Chest: Effort normal and breath sounds normal. No respiratory distress.  Abdominal: Soft. She exhibits no distension. There is no tenderness.  Musculoskeletal: She exhibits no edema or tenderness.  Neurological: She is alert.  Skin: Skin is warm and dry.  Psychiatric: She has a normal mood and affect. Her behavior is normal. Thought content normal.  Nursing note and vitals reviewed.    ED Treatments / Results  Labs (all labs ordered are listed, but only abnormal results are displayed) Labs Reviewed - No data to display  EKG  EKG Interpretation None       Radiology No results found.  Procedures Procedures (including critical care time)  Medications Ordered in ED Medications  trimethoprim-polymyxin b (POLYTRIM) ophthalmic solution 1 drop (not administered)  ibuprofen (ADVIL,MOTRIN) tablet 600 mg (600 mg Oral Given 09/04/16 1603)  diazepam (VALIUM) tablet 5 mg (5 mg Oral Given 09/04/16 1604)  oxyCODONE-acetaminophen (PERCOCET/ROXICET) 5-325 MG per tablet 1 tablet (1 tablet Oral Given 09/04/16 1603)  fluorescein ophthalmic strip 1 strip (1 strip Both Eyes Given 09/04/16 1604)  tetracaine (PONTOCAINE) 0.5 % ophthalmic solution 1 drop (1 drop Both Eyes Given 09/04/16 1604)     Initial Impression / Assessment and Plan / ED Course  I have reviewed the triage vital signs and the nursing notes.  Pertinent labs & imaging results that were available during my care of the patient were reviewed by me and considered in my medical decision making (see chart for details).     41yF with R eye pain. Abrasion? Doesn't wear contacts. Abx. NSAID eye drops. Return precuations discussed. Optho FU.   Final Clinical Impressions(s) / ED Diagnoses   Final diagnoses:  Abrasion of right cornea, initial encounter    New Prescriptions New Prescriptions   No medications on file       Virgel Manifold, MD 09/04/16 1718

## 2016-10-16 ENCOUNTER — Emergency Department (HOSPITAL_COMMUNITY)
Admission: EM | Admit: 2016-10-16 | Discharge: 2016-10-16 | Disposition: A | Discharge status: Home / Self Care | Payer: Medicaid Other | Attending: Physician Assistant | Admitting: Physician Assistant

## 2016-10-16 ENCOUNTER — Encounter (HOSPITAL_COMMUNITY): Payer: Self-pay | Admitting: Pharmacy Technician

## 2016-10-16 ENCOUNTER — Emergency Department (HOSPITAL_COMMUNITY): Payer: Medicaid Other

## 2016-10-16 DIAGNOSIS — R071 Chest pain on breathing: Secondary | ICD-10-CM | POA: Diagnosis not present

## 2016-10-16 DIAGNOSIS — Z79899 Other long term (current) drug therapy: Secondary | ICD-10-CM | POA: Insufficient documentation

## 2016-10-16 DIAGNOSIS — D649 Anemia, unspecified: Secondary | ICD-10-CM | POA: Diagnosis not present

## 2016-10-16 DIAGNOSIS — R079 Chest pain, unspecified: Secondary | ICD-10-CM | POA: Diagnosis present

## 2016-10-16 DIAGNOSIS — E119 Type 2 diabetes mellitus without complications: Secondary | ICD-10-CM | POA: Diagnosis not present

## 2016-10-16 DIAGNOSIS — J45909 Unspecified asthma, uncomplicated: Secondary | ICD-10-CM | POA: Diagnosis not present

## 2016-10-16 DIAGNOSIS — Z7984 Long term (current) use of oral hypoglycemic drugs: Secondary | ICD-10-CM | POA: Insufficient documentation

## 2016-10-16 DIAGNOSIS — G8929 Other chronic pain: Secondary | ICD-10-CM | POA: Insufficient documentation

## 2016-10-16 DIAGNOSIS — F17210 Nicotine dependence, cigarettes, uncomplicated: Secondary | ICD-10-CM | POA: Diagnosis not present

## 2016-10-16 LAB — COMPREHENSIVE METABOLIC PANEL
ALT: 52 U/L (ref 14–54)
AST: 47 U/L — ABNORMAL HIGH (ref 15–41)
Albumin: 3.6 g/dL (ref 3.5–5.0)
Alkaline Phosphatase: 84 U/L (ref 38–126)
Anion gap: 15 (ref 5–15)
BUN: <5 mg/dL — ABNORMAL LOW (ref 6–20)
CO2: 22 mmol/L (ref 22–32)
Calcium: 8.6 mg/dL — ABNORMAL LOW (ref 8.9–10.3)
Chloride: 97 mmol/L — ABNORMAL LOW (ref 101–111)
Creatinine, Ser: 0.86 mg/dL (ref 0.44–1.00)
GFR calc Af Amer: >60 mL/min (ref >60)
GFR calc non Af Amer: >60 mL/min (ref >60)
Glucose, Bld: 103 mg/dL — ABNORMAL HIGH (ref 65–99)
Potassium: 4.5 mmol/L (ref 3.5–5.1)
Sodium: 134 mmol/L — ABNORMAL LOW (ref 135–145)
Total Bilirubin: 3 mg/dL — ABNORMAL HIGH (ref 0.3–1.2)
Total Protein: 7.2 g/dL (ref 6.5–8.1)

## 2016-10-16 LAB — CBC WITH DIFFERENTIAL/PLATELET
Basophils Absolute: 0 10*3/uL (ref 0.0–0.1)
Basophils Relative: 0 %
Eosinophils Absolute: 0 10*3/uL (ref 0.0–0.7)
Eosinophils Relative: 0 %
HCT: 42.6 % (ref 36.0–46.0)
Hemoglobin: 14.8 g/dL (ref 12.0–15.0)
Lymphocytes Relative: 29 %
Lymphs Abs: 2.4 10*3/uL (ref 0.7–4.0)
MCH: 33.5 pg (ref 26.0–34.0)
MCHC: 34.7 g/dL (ref 30.0–36.0)
MCV: 96.4 fL (ref 78.0–100.0)
Monocytes Absolute: 0.5 10*3/uL (ref 0.1–1.0)
Monocytes Relative: 6 %
Neutro Abs: 5.4 10*3/uL (ref 1.7–7.7)
Neutrophils Relative %: 65 %
Platelets: 363 10*3/uL (ref 150–400)
RBC: 4.42 MIL/uL (ref 3.87–5.11)
RDW: 12.7 % (ref 11.5–15.5)
WBC: 8.4 10*3/uL (ref 4.0–10.5)

## 2016-10-16 LAB — I-STAT TROPONIN, ED
Troponin i, poc: 0.01 ng/mL (ref 0.00–0.08)
Troponin i, poc: 0.01 ng/mL (ref 0.00–0.08)

## 2016-10-16 LAB — D-DIMER, QUANTITATIVE: D-Dimer, Quant: 0.46 ug/mL-FEU (ref 0.00–0.50)

## 2016-10-16 LAB — LIPASE, BLOOD: Lipase: 33 U/L (ref 11–51)

## 2016-10-16 MED ORDER — FENTANYL CITRATE (PF) 100 MCG/2ML IJ SOLN
25.0000 ug | Freq: Once | INTRAMUSCULAR | Status: AC
  Administered 2016-10-16: 25 ug via INTRAVENOUS
  Filled 2016-10-16: qty 2

## 2016-10-16 MED ORDER — SUCRALFATE 1 GM/10ML PO SUSP
1.0000 g | Freq: Three times a day (TID) | ORAL | Status: DC
  Administered 2016-10-16: 1 g via ORAL
  Filled 2016-10-16 (×2): qty 10

## 2016-10-16 MED ORDER — ONDANSETRON HCL 4 MG/2ML IJ SOLN
4.0000 mg | Freq: Once | INTRAMUSCULAR | Status: AC
  Administered 2016-10-16: 4 mg via INTRAVENOUS
  Filled 2016-10-16: qty 2

## 2016-10-16 MED ORDER — SODIUM CHLORIDE 0.9 % IV BOLUS (SEPSIS)
1000.0000 mL | Freq: Once | INTRAVENOUS | Status: AC
  Administered 2016-10-16: 1000 mL via INTRAVENOUS

## 2016-10-16 NOTE — Discharge Instructions (Signed)
We do not think your chest pain is due to your heart. We will need you to  be sure to monitor symptoms and return as needed

## 2016-10-16 NOTE — ED Notes (Signed)
Patient transported to X-ray 

## 2016-10-16 NOTE — ED Triage Notes (Signed)
Pt arrives via [facility] from home with reports of central CP onset 0800. EMS states pain in reproducible. Pt also c/o numbness/tingling in all extremities along with vomiting. Pt with hx of anxiety and cardiac arrest. BP 137/92, 102 sinus tach, 96% RA, RR 26, CBG 88. Pt in NAD.

## 2016-10-16 NOTE — ED Provider Notes (Signed)
[facility] DEPT Provider Note   CSN: [id number] Arrival date & time: 10/16/16  1548     History   Chief Complaint Chief Complaint  Patient presents with  . Chest Pain    HPI Marisa [name] is a 41 y.o. female.  HPI   Patient is a 41 year old female with multiple chronic medical problems. She's got chronic headaches, chronic pain, GERD, ectopic pregnancy, presenting today with multiple symptoms.patient reports that shs been having a little bit of shortness of breath and some chest tightness. Patient reports this been going on for the last several days to weeks. She also reports occasional flushing. And also leg pain. She reports numbness and tingling all over.she also reports some nausea vomiting. She is pan positive in history of present illness and ROS.  Patient does have risk factors for pulmonary embolism including, 16 hour long trip to [place] in which she was in a car. On my  interview the patient patientheart rate above 100.    Past Medical History:  Diagnosis Date  . Allergy   . Anal pain    chronic  . Anemia   . Anxiety   . Asthma    exacerbation 02-28-2014 and 02-23-2014 secondary to Rhinovirus  . Chronic headaches   . Chronic low back pain   . Cyst of right ovary   . Difficult intravenous access    PER PT NEEDS PICC LINE  . Gait instability   . GERD (gastroesophageal reflux disease)   . History of adenomatous polyp of colon   . History of cardiac arrest    during SVD 1992  . History of ectopic pregnancy    2009-  S/P LEFT SALPINGECTOMY  . History of panic attacks   . IBS (irritable bowel syndrome)   . Lumbar stenosis L4 -- L5 with bulging disk   w/ right leg weakness/ decreased mobility  . Mild obstructive sleep apnea    study 03-20-2014  no cpap recommended  . Neuromuscular disorder (HCC)    neuropathy in feet   . Sleep apnea    mild no cpap  . Type 2 diabetes mellitus ([place])   . Weakness of right leg    FROM BACK PROBLEM PER PT     Patient Active Problem List   Diagnosis Date Noted  . RLQ abdominal pain   . Chronic diarrhea   . Benign neoplasm of transverse colon   . Benign neoplasm of sigmoid colon   . Neuropathic pain of both legs 06/04/2016  . Carbuncle of labium 07/12/2015  . Rash and nonspecific skin eruption 07/12/2015  . Dandruff 03/26/2015  . Nipple discharge in female 03/26/2015  . Migraine variant with headache 05/09/2014  . Unable to ambulate 05/09/2014  . Severe recurrent major depressive disorder with psychotic features ([place]) 05/04/2014  . GAD (generalized anxiety disorder) 05/04/2014  . 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  . Social anxiety disorder 05/04/2014  . PTSD (post-traumatic stress disorder) 05/04/2014  . Cigarette nicotine dependence without complication 23/76/2831  . Gait disturbance 04/11/2014  . Depression 03/27/2014  . Falls 03/27/2014  . OSA (obstructive sleep apnea) 03/06/2014  . Insomnia 03/06/2014  . Anxiety   . History of cardiac arrest   . Acute bronchitis   . Asthma exacerbation 02/20/2014  . Well controlled type 2 diabetes mellitus ([place]) 02/20/2014  . Sore throat 02/20/2014  . Chest pain 02/20/2014  . Tachycardia 02/20/2014  . Diabetes mellitus without complication ([place]) 51/76/1607  . Pelvic pain 07/25/2013  . Rectal bleeding  07/25/2013  . Persistent vomiting 04/27/2013  . Atypical chest pain 04/26/2013  . Abdominal pain 04/26/2013  . S/P Total vaginal hysterectomy on 01/06/13 01/06/2013  . Dyspnea 09/07/2012  . 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  . Anemia 07/30/2012  . Current smoker 07/30/2012    Past Surgical History:  Procedure Laterality Date  . ABDOMINAL HYSTERECTOMY    . COLONOSCOPY Left 04/29/2013   Procedure: COLONOSCOPY;  Surgeon: Arta Silence, MD;  Location: [facility];  Service: Endoscopy;  Laterality: Left;  . COLONOSCOPY    . COLONOSCOPY WITH PROPOFOL N/A 08/01/2016   Procedure: COLONOSCOPY WITH PROPOFOL;  Surgeon: Doran Stabler,  MD;  Location: [facility];  Service: Gastroenterology;  Laterality: N/A;  . EVALUATION UNDER ANESTHESIA WITH FISTULECTOMY N/A 04/20/2014   Procedure: EXAM UNDER ANESTHESIA ;  Surgeon: Leighton Ruff, MD;  Location: [facility];  Service: General;  Laterality: N/A;  . FLEXIBLE SIGMOIDOSCOPY N/A 11/09/2013   Procedure: FLEXIBLE SIGMOIDOSCOPY;  Surgeon: Arta Silence, MD;  Location: [facility];  Service: Endoscopy;  Laterality: N/A;  . LAPAROSCOPIC CHOLECYSTECTOMY  2005  . SPHINCTEROTOMY N/A 04/20/2014   Procedure:  LATERAL INTERNAL SPHINCTEROTOMY;  Surgeon: Leighton Ruff, MD;  Location: [facility];  Service: General;  Laterality: N/A;  . TRANSTHORACIC ECHOCARDIOGRAM  12-30-2012   mild LVH/  ef 55-60%  . UNILATERAL SALPINGECTOMY  2009   laparotomy left salpingectomy-- ectopic preg.  [facility] UPPER GASTROINTESTINAL ENDOSCOPY    . VAGINAL HYSTERECTOMY N/A 01/06/2013   Procedure: HYSTERECTOMY VAGINAL;  Surgeon: Osborne Oman, MD;  Location: [facility];  Service: Gynecology;  Laterality: N/A;    OB History    Gravida Para Term Preterm AB Living   3 1 1   2 1    SAB TAB Ectopic Multiple Live Births     1 1           Home Medications    Prior to Admission medications   Medication Sig Start Date End Date Taking? Authorizing Provider  albuterol (PROVENTIL HFA;VENTOLIN HFA) 108 (90 BASE) MCG/ACT inhaler Inhale 1-2 puffs into the lungs every 6 (six) hours as needed for wheezing or shortness of breath. 02/23/14  Yes Robbie Lis, MD  ergocalciferol (VITAMIN D2) 50000 units capsule Take 1 capsule (50,000 Units total) by mouth once a week. Patient taking differently: Take 50,000 Units by mouth every Thursday.  06/10/16  Yes Tresa Garter, MD  famotidine (PEPCID) 20 MG tablet Take 1 tablet (20 mg total) by mouth 2 (two) times daily. Patient taking differently: Take 20 mg by mouth daily.  06/11/16  Yes Kirichenko, Tatyana, PA-C  gabapentin (NEURONTIN) 300 MG capsule Take  300 mg by mouth 3 (three) times daily.   Yes [provider]  metFORMIN (GLUCOPHAGE) 500 MG tablet Take 1 tablet (500 mg total) by mouth 2 (two) times daily with a meal. Patient taking differently: Take 500 mg by mouth 3 (three) times daily after meals.  04/16/16  Yes Jegede, Marlena Clipper, MD  mometasone-formoterol (DULERA) 100-5 MCG/ACT AERO Inhale 2 puffs into the lungs 3 (three) times daily.   Yes [provider]  promethazine (PHENERGAN) 25 MG tablet Take 25 mg by mouth every 6 (six) hours as needed for nausea or vomiting.   Yes [provider]  sucralfate (CARAFATE) 1 g tablet Take 1 tablet (1 g total) by mouth 4 (four) times daily -  with meals and at bedtime. 06/11/16  Yes Kirichenko, Tatyana, PA-C  dicyclomine (BENTYL) 10 MG capsule Take 1 capsule (  10 mg total) by mouth 3 (three) times daily before meals. Patient not taking: Reported on 07/29/2016 05/14/16   Doran Stabler, MD  ketorolac (ACULAR) 0.5 % ophthalmic solution Place 1 drop into the right eye every 6 (six) hours. Patient not taking: Reported on 10/16/2016 09/04/16   Virgel Manifold, MD  ondansetron (ZOFRAN) 4 MG tablet Take 1 tablet (4 mg total) by mouth every 6 (six) hours. Patient not taking: Reported on 07/29/2016 02/02/16   Orpah Greek, MD  SUMAtriptan (IMITREX) 25 MG tablet Take 1 tablet (25 mg total) by mouth once. May repeat in 2 hours if headache persists or recurs. Patient not taking: Reported on 07/29/2016 04/16/16 04/16/16  Tresa Garter, MD    Family History Family History  Problem Relation Age of Onset  . Hypertension Mother   . Diabetes Mother   . Allergies Mother   . Heart disease Mother   . Clotting disorder Mother   . Cancer Father   . Hyperlipidemia Father   . Hypertension Father   . Colon cancer Father   . Heart disease Maternal Grandmother   . Schizophrenia Sister   . Bipolar disorder Sister   . Clotting disorder Sister   . Bipolar disorder Brother   .  Kidney disease Brother   . Bipolar disorder Sister   . Pancreatic cancer Maternal Aunt   . Prostate cancer Maternal Uncle   . Liver cancer Maternal Grandfather   . Rectal cancer Maternal Grandfather   . Liver cancer Paternal Grandfather   . Colon polyps Neg Hx   . Esophageal cancer Neg Hx   . Stomach cancer Neg Hx     Social History Social History  Substance Use Topics  . Smoking status: Current Some Day Smoker    Packs/day: 0.20    Years: 11.00    Types: Cigarettes    Last attempt to quit: 11/17/2013  . Smokeless tobacco: Never Used     Comment: 1 pack lasts 3 weeks   . Alcohol use No     Allergies   Asa [aspirin]; Mushroom extract complex; Penicillins; Shellfish allergy; and Triamcinolone   Review of Systems Review of Systems  Constitutional: Positive for activity change, appetite change, fatigue and unexpected weight change.  HENT: Positive for congestion.   Respiratory: Positive for chest tightness and shortness of breath.   Cardiovascular: Positive for chest pain.  Gastrointestinal: Positive for abdominal pain, nausea and vomiting.  Musculoskeletal: Positive for arthralgias and back pain.  Neurological: Positive for dizziness, weakness and numbness.  Psychiatric/Behavioral: Positive for confusion.     Physical Exam Updated Vital Signs BP 129/84   Pulse (!) 47   Resp 20   Ht 5\' 5"  (1.651 m)   Wt 104.3 kg (230 lb)   LMP 11/27/2012   BMI 38.27 kg/m   Physical Exam  Constitutional: She is oriented to person, place, and time. She appears well-developed and well-nourished.  cetnral obesity  HENT:  Head: Normocephalic and atraumatic.  Eyes: Right eye exhibits no discharge.  Cardiovascular: Regular rhythm and normal heart sounds.   No murmur heard. tachycardia  Pulmonary/Chest: Effort normal and breath sounds normal. She has no wheezes. She has no rales.  Abdominal: Soft. She exhibits no distension. There is no tenderness.  Musculoskeletal:  Moving all  four extremiteis with equal strenght.   When I went to touch patient's leg to evaluate  she reports that her legs are extremel6y sensitive to touch and "in so much pain.". This is in  both legs, diffusely.  Neurological: She is oriented to person, place, and time.  Skin: Skin is warm and dry. She is not diaphoretic.  Areas of hypopigmentation, chronic.  Psychiatric: She has a normal mood and affect.  Nursing note and vitals reviewed.    ED Treatments / Results  Labs (all labs ordered are listed, but only abnormal results are displayed) Labs Reviewed  CBC WITH DIFFERENTIAL/PLATELET  COMPREHENSIVE METABOLIC PANEL  D-DIMER, QUANTITATIVE (NOT AT [facility])  I-STAT TROPONIN, ED    EKG  EKG Interpretation None       Radiology No results found.  Procedures Procedures (including critical care time)  Medications Ordered in ED Medications  sodium chloride 0.9 % bolus 1,000 mL (not administered)  ondansetron (ZOFRAN) injection 4 mg (not administered)     Initial Impression / Assessment and Plan / ED Course  I have reviewed the triage vital signs and the nursing notes.  Pertinent labs & imaging results that were available during my care of the patient were reviewed by me and considered in my medical decision making (see chart for details).    Patient is a 41 year old female with multiple chronic medical problems. She's got chronic headaches, chronic pain, GERD, ectopic pregnancy, presenting today with multiple symptoms.patient reports that shs been having a little bit of shortness of breath and some chest tightness. Patient reports this been going on for the last several days to weeks. She also reports occasional flushing. And also leg pain. She reports numbness and tingling all over.she also reports some nausea vomiting. She is pan positive in history of present illness and ROS.  Patient does have risk factors for pulmonary embolism including, 16 hour long trip to [place] in which  she was in a car. On my  interview the patient patientheart rate above 100.   5:00 PM Difficult to narrow down patient's symptoms and consider differential diagnosis given the pan positive nature of both history of present illness and ROS. However I do want to rule out any kind of pulmonary embolism given her recent prolonged immobilization and elevated heart rate when I saw the patient. We will also rule out any kind of electrolyte disturbance We'll give her symptomatic care with nausea medication and fluids.  Final Clinical Impressions(s) / ED Diagnoses   Final diagnoses:  None    New Prescriptions New Prescriptions   No medications on file     Macarthur Critchley, MD 10/25/16 [phone]

## 2016-12-17 ENCOUNTER — Ambulatory Visit: Payer: Medicaid Other | Attending: Internal Medicine | Admitting: Internal Medicine

## 2016-12-17 VITALS — BP 109/77 | HR 75 | Temp 97.8°F | Resp 18 | Ht 65.0 in | Wt 250.0 lb

## 2016-12-17 DIAGNOSIS — Z79899 Other long term (current) drug therapy: Secondary | ICD-10-CM | POA: Diagnosis not present

## 2016-12-17 DIAGNOSIS — E559 Vitamin D deficiency, unspecified: Secondary | ICD-10-CM

## 2016-12-17 DIAGNOSIS — G43509 Persistent migraine aura without cerebral infarction, not intractable, without status migrainosus: Secondary | ICD-10-CM | POA: Diagnosis not present

## 2016-12-17 DIAGNOSIS — G4733 Obstructive sleep apnea (adult) (pediatric): Secondary | ICD-10-CM | POA: Insufficient documentation

## 2016-12-17 DIAGNOSIS — M79604 Pain in right leg: Secondary | ICD-10-CM | POA: Insufficient documentation

## 2016-12-17 DIAGNOSIS — M545 Low back pain: Secondary | ICD-10-CM | POA: Insufficient documentation

## 2016-12-17 DIAGNOSIS — E1140 Type 2 diabetes mellitus with diabetic neuropathy, unspecified: Secondary | ICD-10-CM | POA: Insufficient documentation

## 2016-12-17 DIAGNOSIS — G5793 Unspecified mononeuropathy of bilateral lower limbs: Secondary | ICD-10-CM

## 2016-12-17 DIAGNOSIS — M48061 Spinal stenosis, lumbar region without neurogenic claudication: Secondary | ICD-10-CM | POA: Diagnosis not present

## 2016-12-17 DIAGNOSIS — G8929 Other chronic pain: Secondary | ICD-10-CM | POA: Diagnosis not present

## 2016-12-17 DIAGNOSIS — F5101 Primary insomnia: Secondary | ICD-10-CM

## 2016-12-17 DIAGNOSIS — M79605 Pain in left leg: Secondary | ICD-10-CM | POA: Diagnosis not present

## 2016-12-17 DIAGNOSIS — J4520 Mild intermittent asthma, uncomplicated: Secondary | ICD-10-CM | POA: Insufficient documentation

## 2016-12-17 DIAGNOSIS — Z7984 Long term (current) use of oral hypoglycemic drugs: Secondary | ICD-10-CM | POA: Diagnosis not present

## 2016-12-17 DIAGNOSIS — Z0001 Encounter for general adult medical examination with abnormal findings: Secondary | ICD-10-CM | POA: Diagnosis present

## 2016-12-17 DIAGNOSIS — Z8674 Personal history of sudden cardiac arrest: Secondary | ICD-10-CM | POA: Diagnosis not present

## 2016-12-17 LAB — GLUCOSE, POCT (MANUAL RESULT ENTRY): POC Glucose: 94 mg/dl (ref 70–99)

## 2016-12-17 LAB — POCT GLYCOSYLATED HEMOGLOBIN (HGB A1C): Hemoglobin A1C: 6.2

## 2016-12-17 MED ORDER — MOMETASONE FURO-FORMOTEROL FUM 100-5 MCG/ACT IN AERO: 2.0000 | INHALATION_SPRAY | Freq: Two times a day (BID) | RESPIRATORY_TRACT | 3 refills | Status: DC

## 2016-12-17 MED ORDER — GABAPENTIN 300 MG PO CAPS: 300.0000 mg | ORAL_CAPSULE | Freq: Three times a day (TID) | ORAL | 3 refills | Status: DC

## 2016-12-17 MED ORDER — TRAZODONE HCL 50 MG PO TABS: 25.0000 mg | ORAL_TABLET | Freq: Every evening | ORAL | 3 refills | Status: DC | PRN

## 2016-12-17 MED ORDER — ALBUTEROL SULFATE HFA 108 (90 BASE) MCG/ACT IN AERS: 1.0000 | INHALATION_SPRAY | Freq: Four times a day (QID) | RESPIRATORY_TRACT | 1 refills | Status: DC | PRN

## 2016-12-17 MED ORDER — NYSTATIN 100000 UNIT/GM EX CREA: 1.0000 "application " | TOPICAL_CREAM | Freq: Two times a day (BID) | CUTANEOUS | 1 refills | Status: DC

## 2016-12-17 MED ORDER — ERGOCALCIFEROL 1.25 MG (50000 UT) PO CAPS: 50000.0000 [IU] | ORAL_CAPSULE | ORAL | 3 refills | Status: DC

## 2016-12-17 MED ORDER — SUMATRIPTAN SUCCINATE 25 MG PO TABS: 25.0000 mg | ORAL_TABLET | Freq: Once | ORAL | 0 refills | Status: DC

## 2016-12-17 NOTE — Progress Notes (Signed)
Marisa [name], is a 41 y.o. female  RWE:[id number]  YPP:[id number]  DOB - [birth year]  Chief Complaint  Patient presents with  . Annual Exam      Subjective:   Marisa [name] is a 41 y.o. female with multiple medical history including type 2 diabetes mellitus on metformin, lumbar stenosis with bilateral lower Limb weakness now ambulating well without restrictions, morbid obesity, major depression and anxiety, chronic migraine headache who presents here today for a follow up visit and medication refills. She is complaining of worsening neuropathic pain in her feet bilaterally due to her diabetes. She said she is able to alleviate the pain with heat pad and massage as well as hot compress. She denies nocturia, denies polyphagia, and polydipsia. She claims she does not sleep well, sometimes she sleeps for only 3 hour only to wake up in the middle of the night and would not be able to sleep again. She Patient has No headache, No chest pain, No abdominal pain - No Nausea, No new weakness tingling or numbness, No Cough - SOB.  ALLERGIES: Allergies  Allergen Reactions  . Asa [Aspirin] Anaphylaxis    Hives, chest tightness   . Mushroom Extract Complex Anaphylaxis, Swelling and Other (See Comments)    Reaction:  Eye swelling  . Penicillins Anaphylaxis and Other (See Comments)    Has patient had a PCN reaction causing immediate rash, facial/tongue/throat swelling, SOB or lightheadedness with hypotension: Yes Has patient had a PCN reaction causing severe rash involving mucus membranes or skin necrosis: No Has patient had a PCN reaction that required hospitalization No Has patient had a PCN reaction occurring within the last 10 years: No If all of the above answers are "NO", then may proceed with Cephalosporin use.  . Shellfish Allergy Anaphylaxis  . Triamcinolone Other (See Comments)    Skin issues     PAST MEDICAL HISTORY: Past Medical History:  Diagnosis Date  . Allergy   .  Anal pain    chronic  . Anemia   . Anxiety   . Asthma    exacerbation 02-28-2014 and 02-23-2014 secondary to Rhinovirus  . Chronic headaches   . Chronic low back pain   . Cyst of right ovary   . Difficult intravenous access    PER PT NEEDS PICC LINE  . Gait instability   . GERD (gastroesophageal reflux disease)   . History of adenomatous polyp of colon   . History of cardiac arrest    during SVD 1992  . History of ectopic pregnancy    2009-  S/P LEFT SALPINGECTOMY  . History of panic attacks   . IBS (irritable bowel syndrome)   . Lumbar stenosis L4 -- L5 with bulging disk   w/ right leg weakness/ decreased mobility  . Mild obstructive sleep apnea    study 03-20-2014  no cpap recommended  . Neuromuscular disorder (HCC)    neuropathy in feet   . Sleep apnea    mild no cpap  . Type 2 diabetes mellitus ([place])   . Weakness of right leg    FROM BACK PROBLEM PER PT    MEDICATIONS AT HOME: Prior to Admission medications   Medication Sig Start Date End Date Taking? Authorizing Provider  albuterol (PROVENTIL HFA;VENTOLIN HFA) 108 (90 Base) MCG/ACT inhaler Inhale 1-2 puffs into the lungs every 6 (six) hours as needed for wheezing or shortness of breath. 12/17/16  Yes Tresa Garter, MD  ergocalciferol (VITAMIN D2) 50000 units capsule Take 1 capsule (50,000  Units total) by mouth once a week. 12/17/16  Yes Tresa Garter, MD  famotidine (PEPCID) 20 MG tablet Take 1 tablet (20 mg total) by mouth 2 (two) times daily. Patient taking differently: Take 20 mg by mouth daily.  06/11/16  Yes Kirichenko, Tatyana, PA-C  gabapentin (NEURONTIN) 300 MG capsule Take 1 capsule (300 mg total) by mouth 3 (three) times daily. 12/17/16  Yes Tresa Garter, MD  metFORMIN (GLUCOPHAGE) 500 MG tablet Take 1 tablet (500 mg total) by mouth 2 (two) times daily with a meal. Patient taking differently: Take 500 mg by mouth 3 (three) times daily after meals.  04/16/16  Yes Adrianah Prophete, Marlena Clipper, MD    mometasone-formoterol (DULERA) 100-5 MCG/ACT AERO Inhale 2 puffs into the lungs 2 (two) times daily. 12/17/16  Yes Tresa Garter, MD  ketorolac (ACULAR) 0.5 % ophthalmic solution Place 1 drop into the right eye every 6 (six) hours. Patient not taking: Reported on 10/16/2016 09/04/16   Virgel Manifold, MD  SUMAtriptan (IMITREX) 25 MG tablet Take 1 tablet (25 mg total) by mouth once. May repeat in 2 hours if headache persists or recurs. 12/17/16 12/17/16  Tresa Garter, MD  traZODone (DESYREL) 50 MG tablet Take 0.5-1 tablets (25-50 mg total) by mouth at bedtime as needed for sleep. 12/17/16   Tresa Garter, MD    Objective:   Vitals:   12/17/16 1355  BP: 109/77  Pulse: 75  Resp: 18  Temp: 97.8 F (36.6 C)  TempSrc: Oral  SpO2: 98%  Weight: 250 lb (113.4 kg)  Height: _0  (1.651 m)   Exam General appearance : Awake, alert, not in any distress. Speech Clear. Not toxic looking, obese HEENT: Atraumatic and Normocephalic, pupils equally reactive to light and accomodation Neck: Supple, no JVD. No cervical lymphadenopathy.  Chest: Good air entry bilaterally, no added sounds  CVS: S1 S2 regular, no murmurs.  Abdomen: Bowel sounds present, Non tender and not distended with no gaurding, rigidity or rebound. Extremities: B/L Lower Ext shows no edema, both legs are warm to touch Neurology: Awake alert, and oriented X 3, CN II-XII intact, Non focal Skin: No Rash  Data Review Lab Results  Component Value Date   HGBA1C 7.0 04/16/2016   HGBA1C 7.0 (H) 07/12/2015   HGBA1C 6.2 (H) 02/20/2014    Assessment & Plan   1. Type 2 diabetes mellitus with diabetic neuropathy, without long-term current use of insulin (HCC)  - POCT glucose (manual entry) - POCT glycosylated hemoglobin (Hb A1C) - CBC with Differential/Platelet - CMP14+EGFR - Lipid panel - Urinalysis, Complete  2. Neuropathic pain of both legs - Increase Gabapentin to 300 mg 3x per day from 2 times per day -  gabapentin (NEURONTIN) 300 MG capsule; Take 1 capsule (300 mg total) by mouth 3 (three) times daily.  Dispense: 270 capsule; Refill: 3  3. 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 May repeat in 2 hours if headache persists or recurs.  Dispense: 10 tablet; Refill: 0  4. Mild intermittent asthma without complication  - albuterol (PROVENTIL HFA;VENTOLIN HFA) 108 (90 Base) MCG/ACT inhaler; Inhale 1-2 puffs into the lungs every 6 (six) hours as needed for wheezing or shortness of breath.  Dispense: 1 Inhaler; Refill: 1 - mometasone-formoterol (DULERA) 100-5 MCG/ACT AERO; Inhale 2 puffs into the lungs 2 (two) times daily.  Dispense: 1 Inhaler; Refill: 3  5. Hypovitaminosis D  - ergocalciferol (VITAMIN D2) 50000 units capsule; Take 1 capsule (50,000 Units total) by mouth once a week.  Dispense: 12 capsule; Refill: 3 - VITAMIN D 25 Hydroxy (Vit-D Deficiency, Fractures)  6. Primary insomnia  - traZODone (DESYREL) 50 MG tablet; Take 0.5-1 tablets (25-50 mg total) by mouth at bedtime as needed for sleep.  Dispense: 30 tablet; Refill: 3  Patient have been counseled extensively about nutrition and exercise. Other issues discussed during this visit include: low cholesterol diet, weight control and daily exercise, foot care, annual eye examinations at Ophthalmology, importance of adherence with medications and regular follow-up. We also discussed long term complications of uncontrolled diabetes and hypertension.   Return in about 3 months (around 03/19/2017) for Hemoglobin A1C and Follow up, DM, Follow up HTN.  The patient was given clear instructions to go to ER or return to medical center if symptoms don't improve, worsen or new problems develop. The patient verbalized understanding. The patient was told to call to get lab results if they haven't heard anything in  the next week.   This note has been created with Surveyor, quantity. Any transcriptional errors are unintentional.    Angelica Chessman, MD, [place], [place], [place], [place] and [place], [place]   12/17/2016, 2:23 PM

## 2016-12-17 NOTE — Patient Instructions (Signed)
Diabetes Mellitus and Food It is important for you to manage your blood sugar (glucose) level. Your blood glucose level can be greatly affected by what you eat. Eating healthier foods in the appropriate amounts throughout the day at about the same time each day will help you control your blood glucose level. It can also help slow or prevent worsening of your diabetes mellitus. Healthy eating may even help you improve the level of your blood pressure and reach or maintain a healthy weight. General recommendations for healthful eating and cooking habits include:  Eating meals and snacks regularly. Avoid going long periods of time without eating to lose weight.  Eating a diet that consists mainly of plant-based foods, such as fruits, vegetables, nuts, legumes, and whole grains.  Using low-heat cooking methods, such as baking, instead of high-heat cooking methods, such as deep frying.  Work with your dietitian to make sure you understand how to use the Nutrition Facts information on food labels. How can food affect me? Carbohydrates Carbohydrates affect your blood glucose level more than any other type of food. Your dietitian will help you determine how many carbohydrates to eat at each meal and teach you how to count carbohydrates. Counting carbohydrates is important to keep your blood glucose at a healthy level, especially if you are using insulin or taking certain medicines for diabetes mellitus. Alcohol Alcohol can cause sudden decreases in blood glucose (hypoglycemia), especially if you use insulin or take certain medicines for diabetes mellitus. Hypoglycemia can be a life-threatening condition. Symptoms of hypoglycemia (sleepiness, dizziness, and disorientation) are similar to symptoms of having too much alcohol. If your health care provider has given you approval to drink alcohol, do so in moderation and use the following guidelines:  Women should not have more than one drink per day, and men  should not have more than two drinks per day. One drink is equal to: ? 12 oz of beer. ? 5 oz of wine. ? 1 oz of hard liquor.  Do not drink on an empty stomach.  Keep yourself hydrated. Have water, diet soda, or unsweetened iced tea.  Regular soda, juice, and other mixers might contain a lot of carbohydrates and should be counted.  What foods are not recommended? As you make food choices, it is important to remember that all foods are not the same. Some foods have fewer nutrients per serving than other foods, even though they might have the same number of calories or carbohydrates. It is difficult to get your body what it needs when you eat foods with fewer nutrients. Examples of foods that you should avoid that are high in calories and carbohydrates but low in nutrients include:  Trans fats (most processed foods list trans fats on the Nutrition Facts label).  Regular soda.  Juice.  Candy.  Sweets, such as cake, pie, doughnuts, and cookies.  Fried foods.  What foods can I eat? Eat nutrient-rich foods, which will nourish your body and keep you healthy. The food you should eat also will depend on several factors, including:  The calories you need.  The medicines you take.  Your weight.  Your blood glucose level.  Your blood pressure level.  Your cholesterol level.  You should eat a variety of foods, including:  Protein. ? Lean cuts of meat. ? Proteins low in saturated fats, such as fish, egg whites, and beans. Avoid processed meats.  Fruits and vegetables. ? Fruits and vegetables that may help control blood glucose levels, such as apples,   mangoes, and yams.  Dairy products. ? Choose fat-free or low-fat dairy products, such as milk, yogurt, and cheese.  Grains, bread, pasta, and rice. ? Choose whole grain products, such as multigrain bread, whole oats, and brown rice. These foods may help control blood pressure.  Fats. ? Foods containing healthful fats, such as  nuts, avocado, olive oil, canola oil, and fish.  Does everyone with diabetes mellitus have the same meal plan? Because every person with diabetes mellitus is different, there is not one meal plan that works for everyone. It is very important that you meet with a dietitian who will help you create a meal plan that is just right for you. This information is not intended to replace advice given to you by your health care provider. Make sure you discuss any questions you have with your health care provider. Document Released: 10/31/2004 Document Revised: 07/12/2015 Document Reviewed: 12/31/2012 Elsevier Interactive Patient Education  2017 [employer] Diabetes and Foot Care Diabetes may cause you to have problems because of poor blood supply (circulation) to your feet and legs. This may cause the skin on your feet to become thinner, break easier, and heal more slowly. Your skin may become dry, and the skin may peel and crack. You may also have nerve damage in your legs and feet causing decreased feeling in them. You may not notice minor injuries to your feet that could lead to infections or more serious problems. Taking care of your feet is one of the most important things you can do for yourself. Follow these instructions at home:  Wear shoes at all times, even in the house. Do not go barefoot. Bare feet are easily injured.  Check your feet daily for blisters, cuts, and redness. If you cannot see the bottom of your feet, use a mirror or ask someone for help.  Wash your feet with warm water (do not use hot water) and mild soap. Then pat your feet and the areas between your toes until they are completely dry. Do not soak your feet as this can dry your skin.  Apply a moisturizing lotion or petroleum jelly (that does not contain alcohol and is unscented) to the skin on your feet and to dry, brittle toenails. Do not apply lotion between your toes.  Trim your toenails straight across. Do not dig under  them or around the cuticle. File the edges of your nails with an emery board or nail file.  Do not cut corns or calluses or try to remove them with medicine.  Wear clean socks or stockings every day. Make sure they are not too tight. Do not wear knee-high stockings since they may decrease blood flow to your legs.  Wear shoes that fit properly and have enough cushioning. To break in new shoes, wear them for just a few hours a day. This prevents you from injuring your feet. Always look in your shoes before you put them on to be sure there are no objects inside.  Do not cross your legs. This may decrease the blood flow to your feet.  If you find a minor scrape, cut, or break in the skin on your feet, keep it and the skin around it clean and dry. These areas may be cleansed with mild soap and water. Do not cleanse the area with peroxide, alcohol, or iodine.  When you remove an adhesive bandage, be sure not to damage the skin around it.  If you have a wound, look at it several times   a day to make sure it is healing.  Do not use heating pads or hot water bottles. They may burn your skin. If you have lost feeling in your feet or legs, you may not know it is happening until it is too late.  Make sure your health care provider performs a complete foot exam at least annually or more often if you have foot problems. Report any cuts, sores, or bruises to your health care provider immediately. Contact a health care provider if:  You have an injury that is not healing.  You have cuts or breaks in the skin.  You have an ingrown nail.  You notice redness on your legs or feet.  You feel burning or tingling in your legs or feet.  You have pain or cramps in your legs and feet.  Your legs or feet are numb.  Your feet always feel cold. Get help right away if:  There is increasing redness, swelling, or pain in or around a wound.  There is a red line that goes up your leg.  Pus is coming from a  wound.  You develop a fever or as directed by your health care provider.  You notice a bad smell coming from an ulcer or wound. This information is not intended to replace advice given to you by your health care provider. Make sure you discuss any questions you have with your health care provider. Document Released: 02/01/2000 Document Revised: 07/12/2015 Document Reviewed: 07/13/2012 Elsevier Interactive Patient Education  2017 [employer]. Diabetes Mellitus and Exercise Exercising regularly is important for your overall health, especially when you have diabetes (diabetes mellitus). Exercising is not only about losing weight. It has many health benefits, such as increasing muscle strength and bone density and reducing body fat and stress. This leads to improved fitness, flexibility, and endurance, all of which result in better overall health. Exercise has additional benefits for people with diabetes, including:  Reducing appetite.  Helping to lower and control blood glucose.  Lowering blood pressure.  Helping to control amounts of fatty substances (lipids) in the blood, such as cholesterol and triglycerides.  Helping the body to respond better to insulin (improving insulin sensitivity).  Reducing how much insulin the body needs.  Decreasing the risk for heart disease by: ? Lowering cholesterol and triglyceride levels. ? Increasing the levels of good cholesterol. ? Lowering blood glucose levels.  What is my activity plan? Your health care provider or certified diabetes educator can help you make a plan for the type and frequency of exercise (activity plan) that works for you. Make sure that you:  Do at least 150 minutes of moderate-intensity or vigorous-intensity exercise each week. This could be brisk walking, biking, or water aerobics. ? Do stretching and strength exercises, such as yoga or weightlifting, at least 2 times a week. ? Spread out your activity over at least 3 days  of the week.  Get some form of physical activity every day. ? Do not go more than 2 days in a row without some kind of physical activity. ? Avoid being inactive for more than 90 minutes at a time. Take frequent breaks to walk or stretch.  Choose a type of exercise or activity that you enjoy, and set realistic goals.  Start slowly, and gradually increase the intensity of your exercise over time.  What do I need to know about managing my diabetes?  Check your blood glucose before and after exercising. ? If your blood glucose is higher  than 240 mg/dL (13.3 mmol/L) before you exercise, check your urine for ketones. If you have ketones in your urine, do not exercise until your blood glucose returns to normal.  Know the symptoms of low blood glucose (hypoglycemia) and how to treat it. Your risk for hypoglycemia increases during and after exercise. Common symptoms of hypoglycemia can include: ? Hunger. ? Anxiety. ? Sweating and feeling clammy. ? Confusion. ? Dizziness or feeling light-headed. ? Increased heart rate or palpitations. ? Blurry vision. ? Tingling or numbness around the mouth, lips, or tongue. ? Tremors or shakes. ? Irritability.  Keep a rapid-acting carbohydrate snack available before, during, and after exercise to help prevent or treat hypoglycemia.  Avoid injecting insulin into areas of the body that are going to be exercised. For example, avoid injecting insulin into: ? The arms, when playing tennis. ? The legs, when jogging.  Keep records of your exercise habits. Doing this can help you and your health care provider adjust your diabetes management plan as needed. Write down: ? Food that you eat before and after you exercise. ? Blood glucose levels before and after you exercise. ? The type and amount of exercise you have done. ? When your insulin is expected to peak, if you use insulin. Avoid exercising at times when your insulin is peaking.  When you start a new  exercise or activity, work with your health care provider to make sure the activity is safe for you, and to adjust your insulin, medicines, or food intake as needed.  Drink plenty of water while you exercise to prevent dehydration or heat stroke. Drink enough fluid to keep your urine clear or pale yellow. This information is not intended to replace advice given to you by your health care provider. Make sure you discuss any questions you have with your health care provider. Document Released: 04/26/2003 Document Revised: 08/24/2015 Document Reviewed: 07/16/2015 Elsevier Interactive Patient Education  2018 [employer].

## 2016-12-17 NOTE — Progress Notes (Signed)
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 She said she elevatee, messagee, and add compress to it but it don't help.

## 2016-12-18 LAB — LIPID PANEL
Chol/HDL Ratio: 5.2 ratio — ABNORMAL HIGH (ref 0.0–4.4)
Cholesterol, Total: 170 mg/dL (ref 100–199)
HDL: 33 mg/dL — ABNORMAL LOW (ref >39)
LDL Calculated: 106 mg/dL — ABNORMAL HIGH (ref 0–99)
Triglycerides: 157 mg/dL — ABNORMAL HIGH (ref 0–149)
VLDL Cholesterol Cal: 31 mg/dL (ref 5–40)

## 2016-12-18 LAB — CMP14+EGFR
ALT: 169 IU/L — ABNORMAL HIGH (ref 0–32)
AST: 184 IU/L — ABNORMAL HIGH (ref 0–40)
Albumin/Globulin Ratio: 1.4 (ref 1.2–2.2)
Albumin: 3.9 g/dL (ref 3.5–5.5)
Alkaline Phosphatase: 62 IU/L (ref 39–117)
BUN/Creatinine Ratio: 14 (ref 9–23)
BUN: 10 mg/dL (ref 6–24)
Bilirubin Total: 0.5 mg/dL (ref 0.0–1.2)
CO2: 23 mmol/L (ref 20–29)
Calcium: 9.1 mg/dL (ref 8.7–10.2)
Chloride: 104 mmol/L (ref 96–106)
Creatinine, Ser: 0.71 mg/dL (ref 0.57–1.00)
GFR calc Af Amer: 121 mL/min/{1.73_m2} (ref >59)
GFR calc non Af Amer: 105 mL/min/{1.73_m2} (ref >59)
Globulin, Total: 2.8 g/dL (ref 1.5–4.5)
Glucose: 90 mg/dL (ref 65–99)
Potassium: 4.5 mmol/L (ref 3.5–5.2)
Sodium: 142 mmol/L (ref 134–144)
Total Protein: 6.7 g/dL (ref 6.0–8.5)

## 2016-12-18 LAB — URINALYSIS, COMPLETE
Bilirubin, UA: NEGATIVE
Glucose, UA: NEGATIVE
Ketones, UA: NEGATIVE
Leukocytes, UA: NEGATIVE
Nitrite, UA: NEGATIVE
Protein, UA: NEGATIVE
RBC, UA: NEGATIVE
Specific Gravity, UA: >=1.03 — AB (ref 1.005–1.030)
Urobilinogen, Ur: 1 mg/dL (ref 0.2–1.0)
pH, UA: 5.5 (ref 5.0–7.5)

## 2016-12-18 LAB — CBC WITH DIFFERENTIAL/PLATELET
Basophils Absolute: 0 10*3/uL (ref 0.0–0.2)
Basos: 0 %
EOS (ABSOLUTE): 0.1 10*3/uL (ref 0.0–0.4)
Eos: 2 %
Hematocrit: 40.3 % (ref 34.0–46.6)
Hemoglobin: 13.5 g/dL (ref 11.1–15.9)
Immature Grans (Abs): 0 10*3/uL (ref 0.0–0.1)
Immature Granulocytes: 0 %
Lymphocytes Absolute: 2.7 10*3/uL (ref 0.7–3.1)
Lymphs: 44 %
MCH: 33.5 pg — ABNORMAL HIGH (ref 26.6–33.0)
MCHC: 33.5 g/dL (ref 31.5–35.7)
MCV: 100 fL — ABNORMAL HIGH (ref 79–97)
Monocytes Absolute: 0.6 10*3/uL (ref 0.1–0.9)
Monocytes: 9 %
Neutrophils Absolute: 2.8 10*3/uL (ref 1.4–7.0)
Neutrophils: 45 %
Platelets: 328 10*3/uL (ref 150–379)
RBC: 4.03 x10E6/uL (ref 3.77–5.28)
RDW: 13.6 % (ref 12.3–15.4)
WBC: 6.3 10*3/uL (ref 3.4–10.8)

## 2016-12-18 LAB — MICROSCOPIC EXAMINATION: Casts: NONE SEEN /lpf

## 2016-12-18 LAB — VITAMIN D 25 HYDROXY (VIT D DEFICIENCY, FRACTURES): Vit D, 25-Hydroxy: 18.1 ng/mL — ABNORMAL LOW (ref 30.0–100.0)

## 2016-12-30 ENCOUNTER — Telehealth (INDEPENDENT_AMBULATORY_CARE_PROVIDER_SITE_OTHER): Payer: Self-pay | Admitting: *Deleted

## 2016-12-30 NOTE — Telephone Encounter (Signed)
Patient verified DOB Patient is aware of urine and blood count being normal. Patient is also aware of vitamin d being improved and needing to continue taking daily supplement. Patient advised of liver enzyme being elevated and to avoid alcohol and tylenol. Patient reports stopping her drinking. Patient is aware of cholesterol being high and needing to implement some lifestyle and nutritional changes. Patient complains of a mole growth and was instructed to contact previous dermatology for a follow up appointment. If a referral is needed it can be requested and placed. No further questions at this time.

## 2016-12-30 NOTE — Telephone Encounter (Signed)
-----   Message from Tresa Garter, MD sent at 12/21/2016  1:02 PM EST ----- Please inform patient that her lab results showed normal urine, normal blood level, liver function slightly high may be from fatty infiltration, please avoid alcohol and tylenol, cholesterol is slightly high, to address this please limit saturated fat to no more than 7% of your calories, limit cholesterol to 200 mg/day, increase fiber and exercise as tolerated.. Vitamin D level is low but better than before, continue Vitamin D capsule as prescribed.

## 2017-01-02 ENCOUNTER — Telehealth: Payer: Self-pay | Admitting: Internal Medicine

## 2017-01-02 NOTE — Telephone Encounter (Signed)
Pt called to request a prescription since she is retaining water please call her back for any more info

## 2017-01-02 NOTE — Telephone Encounter (Signed)
Pt called to request a referral for [facility] since she has to big mass on her breast that need to be check pt has medicaid, please need this referral since she is in pain

## 2017-01-04 ENCOUNTER — Other Ambulatory Visit: Payer: Self-pay | Admitting: Internal Medicine

## 2017-01-04 DIAGNOSIS — N63 Unspecified lump in unspecified breast: Secondary | ICD-10-CM

## 2017-01-04 NOTE — Telephone Encounter (Signed)
Diagnostic Mammogram Ordered. Please schedule and inform patient. Thank you

## 2017-01-07 NOTE — Telephone Encounter (Signed)
Pt called back to check on the statu her request for med for water retain, please follow up

## 2017-01-12 ENCOUNTER — Other Ambulatory Visit: Payer: Self-pay | Admitting: Internal Medicine

## 2017-01-12 DIAGNOSIS — N63 Unspecified lump in unspecified breast: Secondary | ICD-10-CM

## 2017-01-22 ENCOUNTER — Other Ambulatory Visit: Payer: Self-pay

## 2017-01-22 ENCOUNTER — Telehealth: Payer: Self-pay | Admitting: Internal Medicine

## 2017-01-22 ENCOUNTER — Other Ambulatory Visit: Payer: Self-pay | Admitting: Internal Medicine

## 2017-01-22 ENCOUNTER — Ambulatory Visit
Admission: RE | Admit: 2017-01-22 | Discharge: 2017-01-22 | Disposition: A | Discharge status: Home / Self Care | Payer: Medicaid Other | Source: Ambulatory Visit | Attending: Internal Medicine | Admitting: Internal Medicine

## 2017-01-22 DIAGNOSIS — N63 Unspecified lump in unspecified breast: Secondary | ICD-10-CM

## 2017-01-22 NOTE — Telephone Encounter (Signed)
Patient called stating that because of the lumps in her breast she needs a emergency ultrasound. She stated that the breast center has been trying to get in contact with the clinic for 3 weeks.

## 2017-01-28 ENCOUNTER — Ambulatory Visit
Admission: RE | Admit: 2017-01-28 | Discharge: 2017-01-28 | Disposition: A | Discharge status: Home / Self Care | Payer: Medicaid Other | Source: Ambulatory Visit | Attending: Internal Medicine | Admitting: Internal Medicine

## 2017-01-28 ENCOUNTER — Other Ambulatory Visit: Payer: Self-pay | Admitting: Internal Medicine

## 2017-01-28 DIAGNOSIS — N6009 Solitary cyst of unspecified breast: Secondary | ICD-10-CM

## 2017-01-28 DIAGNOSIS — N63 Unspecified lump in unspecified breast: Secondary | ICD-10-CM

## 2017-01-28 NOTE — Progress Notes (Signed)
-

## 2017-01-28 NOTE — Telephone Encounter (Signed)
Patient called saying that the breast center said that the doctor needs to place and emergancy refill for dermatology for the cyst under her breast because they are infected.

## 2017-01-28 NOTE — Telephone Encounter (Signed)
Please inform patient that she has been referred to a Dermatologist

## 2017-01-29 ENCOUNTER — Ambulatory Visit (INDEPENDENT_AMBULATORY_CARE_PROVIDER_SITE_OTHER): Payer: Medicaid Other

## 2017-01-29 ENCOUNTER — Ambulatory Visit: Payer: Medicaid Other | Admitting: Podiatry

## 2017-01-29 ENCOUNTER — Other Ambulatory Visit: Payer: Self-pay

## 2017-01-29 ENCOUNTER — Encounter: Payer: Self-pay | Admitting: Podiatry

## 2017-01-29 DIAGNOSIS — M722 Plantar fascial fibromatosis: Secondary | ICD-10-CM | POA: Diagnosis not present

## 2017-01-29 MED ORDER — TRIAMCINOLONE ACETONIDE 10 MG/ML IJ SUSP
10.0000 mg | Freq: Once | INTRAMUSCULAR | Status: AC
  Administered 2017-01-29: 10 mg

## 2017-01-29 NOTE — Telephone Encounter (Signed)
Patient verified DOB Patient is aware of dermatology referral being placed and to allow a few weeks to schedule an initial appointment. Patient complains of retaining fluid and taking a diuretic which relieved some. Patient has an appointment on 03/18/16 with PCP and would like to discuss those concerns then.

## 2017-01-29 NOTE — Progress Notes (Signed)
Subjective:   Patient ID: Marisa Marisa [name], female   DOB: 41 y.o.   MRN: [id number]   HPI Patient presents stating that the heels have flared up again over the Marisa [name] several weeks and she had improvement for approximately 6 months.   ROS      Objective:  Physical Exam  Neurovascular status intact with patient found to have exquisite discomfort plantar aspect heel region bilateral     Assessment:  Acute plantar fasciitis bilateral heels     Plan:  H&P condition reviewed and reinjected the plantar fascial bilateral 3 mg Kenalog 5 mg Xylocaine and discussed physical therapy.  Reappoint to recheck

## 2017-01-29 NOTE — Progress Notes (Deleted)
[facility] Note  01/30/2017     CHIEF COMPLAINT Patient presents for No chief complaint on file.   HISTORY OF PRESENT ILLNESS: Marisa [name] is a 41 y.o. female who presents to the clinic today for:     Referring physician: Groat, Darlina Guys, MD [address] [place], [place] [address]  HISTORICAL INFORMATION:   Selected notes from the MEDICAL RECORD NUMBER Referred by Dr. Tommye Standard for concern of floaters and flashes LEE- 12.13.18 (S.Groat) [BCVA OD: 20/20 OS: 20/20] Ocular Hx- lattice degen OU, chronic giant papillary conjunctivitis PMH- NIDDM, asthma, migraine    CURRENT MEDICATIONS: Current Outpatient Medications (Ophthalmic Drugs)  Medication Sig   ketorolac (ACULAR) 0.5 % ophthalmic solution Place 1 drop into the right eye every 6 (six) hours. (Patient not taking: Reported on 10/16/2016)   No current facility-administered medications for this visit.  (Ophthalmic Drugs)   Current Outpatient Medications (Other)  Medication Sig   albuterol (PROVENTIL HFA;VENTOLIN HFA) 108 (90 Base) MCG/ACT inhaler Inhale 1-2 puffs into the lungs every 6 (six) hours as needed for wheezing or shortness of breath.   ergocalciferol (VITAMIN D2) 50000 units capsule Take 1 capsule (50,000 Units total) by mouth once a week.   famotidine (PEPCID) 20 MG tablet Take 1 tablet (20 mg total) by mouth 2 (two) times daily. (Patient taking differently: Take 20 mg by mouth daily. )   gabapentin (NEURONTIN) 300 MG capsule Take 1 capsule (300 mg total) by mouth 3 (three) times daily.   metFORMIN (GLUCOPHAGE) 500 MG tablet Take 1 tablet (500 mg total) by mouth 2 (two) times daily with a meal. (Patient taking differently: Take 500 mg by mouth 3 (three) times daily after meals. )   mometasone-formoterol (DULERA) 100-5 MCG/ACT AERO Inhale 2 puffs into the lungs 2 (two) times daily.   nystatin cream (MYCOSTATIN) Apply 1 application topically 2 (two) times daily.    SUMAtriptan (IMITREX) 25 MG tablet Take 1 tablet (25 mg total) by mouth once. May repeat in 2 hours if headache persists or recurs.   traZODone (DESYREL) 50 MG tablet Take 0.5-1 tablets (25-50 mg total) by mouth at bedtime as needed for sleep.   No current facility-administered medications for this visit.  (Other)      REVIEW OF SYSTEMS:    ALLERGIES Allergies  Allergen Reactions   Asa [Aspirin] Anaphylaxis    Hives, chest tightness    Mushroom Extract Complex Anaphylaxis, Swelling and Other (See Comments)    Reaction:  Eye swelling   Penicillins Anaphylaxis and Other (See Comments)    Has patient had a PCN reaction causing immediate rash, facial/tongue/throat swelling, SOB or lightheadedness with hypotension: Yes Has patient had a PCN reaction causing severe rash involving mucus membranes or skin necrosis: No Has patient had a PCN reaction that required hospitalization No Has patient had a PCN reaction occurring within the last 10 years: No If all of the above answers are "NO", then may proceed with Cephalosporin use.   Shellfish Allergy Anaphylaxis   Triamcinolone Other (See Comments)    Skin issues     PAST MEDICAL HISTORY Past Medical History:  Diagnosis Date   Allergy    Anal pain    chronic   Anemia    Anxiety    Asthma    exacerbation 02-28-2014 and 02-23-2014 secondary to Rhinovirus   Chronic headaches    Chronic low back pain    Cyst of right ovary    Difficult  intravenous access    PER PT NEEDS PICC LINE   Gait instability    GERD (gastroesophageal reflux disease)    History of adenomatous polyp of colon    History of cardiac arrest    during SVD 1992   History of ectopic pregnancy    2009-  S/P LEFT SALPINGECTOMY   History of panic attacks    IBS (irritable bowel syndrome)    Lumbar stenosis L4 -- L5 with bulging disk   w/ right leg weakness/ decreased mobility   Mild obstructive sleep apnea    study 03-20-2014  no cpap  recommended   Neuromuscular disorder (HCC)    neuropathy in feet    Sleep apnea    mild no cpap   Type 2 diabetes mellitus (HCC)    Weakness of right leg    FROM BACK PROBLEM PER PT   Past Surgical History:  Procedure Laterality Date   ABDOMINAL HYSTERECTOMY     COLONOSCOPY Left 04/29/2013   Procedure: COLONOSCOPY;  Surgeon: Arta Silence, MD;  Location: [facility];  Service: Endoscopy;  Laterality: Left;   COLONOSCOPY     COLONOSCOPY WITH PROPOFOL N/A 08/01/2016   Procedure: COLONOSCOPY WITH PROPOFOL;  Surgeon: Doran Stabler, MD;  Location: [facility];  Service: Gastroenterology;  Laterality: N/A;   EVALUATION UNDER ANESTHESIA WITH FISTULECTOMY N/A 04/20/2014   Procedure: EXAM UNDER ANESTHESIA ;  Surgeon: Leighton Ruff, MD;  Location: [facility];  Service: General;  Laterality: N/A;   FLEXIBLE SIGMOIDOSCOPY N/A 11/09/2013   Procedure: FLEXIBLE SIGMOIDOSCOPY;  Surgeon: Arta Silence, MD;  Location: [facility];  Service: Endoscopy;  Laterality: N/A;   LAPAROSCOPIC CHOLECYSTECTOMY  2005   SPHINCTEROTOMY N/A 04/20/2014   Procedure:  LATERAL INTERNAL SPHINCTEROTOMY;  Surgeon: Leighton Ruff, MD;  Location: [facility];  Service: General;  Laterality: N/A;   TRANSTHORACIC ECHOCARDIOGRAM  12-30-2012   mild LVH/  ef 55-60%   UNILATERAL SALPINGECTOMY  2009   laparotomy left salpingectomy-- ectopic preg.   UPPER GASTROINTESTINAL ENDOSCOPY     VAGINAL HYSTERECTOMY N/A 01/06/2013   Procedure: HYSTERECTOMY VAGINAL;  Surgeon: Osborne Oman, MD;  Location: [facility];  Service: Gynecology;  Laterality: N/A;    FAMILY HISTORY Family History  Problem Relation Age of Onset   Hypertension Mother    Diabetes Mother    Allergies Mother    Heart disease Mother    Clotting disorder Mother    Cancer Father    Hyperlipidemia Father    Hypertension Father    Colon cancer Father    Heart disease Maternal Grandmother    Breast cancer  Maternal Grandmother 32   Schizophrenia Sister    Bipolar disorder Sister    Clotting disorder Sister    Bipolar disorder Brother    Kidney disease Brother    Bipolar disorder Sister    Pancreatic cancer Maternal Aunt    Prostate cancer Maternal Uncle    Liver cancer Maternal Grandfather    Rectal cancer Maternal Grandfather    Liver cancer Paternal Grandfather    Colon polyps Neg Hx    Esophageal cancer Neg Hx    Stomach cancer Neg Hx     SOCIAL HISTORY Social History   Tobacco Use   Smoking status: Current Some Day Smoker    Packs/day: 0.20    Years: 11.00    Pack years: 2.20    Types: Cigarettes    Last attempt to quit: 11/17/2013    Years since quitting: 3.2  Smokeless tobacco: Never Used   Tobacco comment: 1 pack lasts 3 weeks   Substance Use Topics   Alcohol use: No    Alcohol/week: 0.0 oz   Drug use: No         OPHTHALMIC EXAM:   Not recorded      IMAGING AND PROCEDURES  Imaging and Procedures for 01/29/17           ASSESSMENT/PLAN:    ICD-10-CM   1. Retinal edema H35.81 OCT, Retina - OU - Both Eyes    1.  2.  3.  Ophthalmic Meds Ordered this visit:  No orders of the defined types were placed in this encounter.      No Follow-up on file.  There are no Patient Instructions on file for this visit.   Explained the diagnoses, plan, and follow up with the patient and they expressed understanding.  Patient expressed understanding of the importance of proper follow up care.   Gardiner Sleeper, M.D., Ph.D. Diseases & Surgery of the Retina and [place] 01/29/17     Abbreviations: M myopia (nearsighted); A astigmatism; H hyperopia (farsighted); P presbyopia; Mrx spectacle prescription;  CTL contact lenses; OD right eye; OS left eye; OU both eyes  XT exotropia; ET esotropia; PEK punctate epithelial keratitis; PEE punctate epithelial erosions; DES dry eye syndrome; MGD meibomian  gland dysfunction; ATs artificial tears; PFAT's preservative free artificial tears; [place] nuclear sclerotic cataract; PSC posterior subcapsular cataract; ERM epi-retinal membrane; PVD posterior vitreous detachment; RD retinal detachment; DM diabetes mellitus; DR diabetic retinopathy; NPDR non-proliferative diabetic retinopathy; PDR proliferative diabetic retinopathy; CSME clinically significant macular edema; DME diabetic macular edema; dbh dot blot hemorrhages; CWS cotton wool spot; POAG primary open angle glaucoma; C/D cup-to-disc ratio; HVF humphrey visual field; GVF goldmann visual field; OCT optical coherence tomography; IOP intraocular pressure; BRVO Branch retinal vein occlusion; CRVO central retinal vein occlusion; CRAO central retinal artery occlusion; BRAO branch retinal artery occlusion; RT retinal tear; SB scleral buckle; PPV pars plana vitrectomy; VH Vitreous hemorrhage; PRP panretinal laser photocoagulation; IVK intravitreal kenalog; VMT vitreomacular traction; MH Macular hole;  NVD neovascularization of the disc; NVE neovascularization elsewhere; AREDS age related eye disease study; ARMD age related macular degeneration; POAG primary open angle glaucoma; EBMD epithelial/anterior basement membrane dystrophy; ACIOL anterior chamber intraocular lens; IOL intraocular lens; PCIOL posterior chamber intraocular lens; Phaco/IOL phacoemulsification with intraocular lens placement; [place] photorefractive keratectomy; LASIK laser assisted in situ keratomileusis; HTN hypertension; DM diabetes mellitus; COPD chronic obstructive pulmonary disease

## 2017-01-30 ENCOUNTER — Encounter (INDEPENDENT_AMBULATORY_CARE_PROVIDER_SITE_OTHER): Payer: Medicaid Other | Admitting: Ophthalmology

## 2017-02-03 ENCOUNTER — Other Ambulatory Visit: Payer: Self-pay | Admitting: Internal Medicine

## 2017-02-06 NOTE — Telephone Encounter (Signed)
-----   Message from Tresa Garter, MD sent at 02/03/2017  1:23 PM EST ----- Please inform patient that her breasts ultrasounds showed "No ultrasound evidence for malignancy. Findings consistent with inflammation or infection",we will refer to dermatologist for further evaluation and management.

## 2017-02-06 NOTE — Telephone Encounter (Signed)
Patient verified DOB Patient is aware of no evidence of malignancy but inflammation or infection is consistent. Patient aware of referral to dermatology being placed further evaluation. No further questions at this time.

## 2017-02-10 ENCOUNTER — Emergency Department (HOSPITAL_COMMUNITY)
Admission: EM | Admit: 2017-02-10 | Discharge: 2017-02-10 | Disposition: A | Discharge status: Home / Self Care | Payer: Medicaid Other | Attending: Emergency Medicine | Admitting: Emergency Medicine

## 2017-02-10 ENCOUNTER — Encounter (HOSPITAL_COMMUNITY): Payer: Self-pay | Admitting: Emergency Medicine

## 2017-02-10 ENCOUNTER — Other Ambulatory Visit: Payer: Self-pay

## 2017-02-10 DIAGNOSIS — L988 Other specified disorders of the skin and subcutaneous tissue: Secondary | ICD-10-CM

## 2017-02-10 DIAGNOSIS — F17210 Nicotine dependence, cigarettes, uncomplicated: Secondary | ICD-10-CM | POA: Diagnosis not present

## 2017-02-10 DIAGNOSIS — E119 Type 2 diabetes mellitus without complications: Secondary | ICD-10-CM | POA: Insufficient documentation

## 2017-02-10 DIAGNOSIS — J45909 Unspecified asthma, uncomplicated: Secondary | ICD-10-CM | POA: Diagnosis not present

## 2017-02-10 DIAGNOSIS — N649 Disorder of breast, unspecified: Secondary | ICD-10-CM

## 2017-02-10 DIAGNOSIS — L989 Disorder of the skin and subcutaneous tissue, unspecified: Secondary | ICD-10-CM | POA: Insufficient documentation

## 2017-02-10 DIAGNOSIS — Z79899 Other long term (current) drug therapy: Secondary | ICD-10-CM | POA: Diagnosis not present

## 2017-02-10 DIAGNOSIS — Z7984 Long term (current) use of oral hypoglycemic drugs: Secondary | ICD-10-CM | POA: Diagnosis not present

## 2017-02-10 DIAGNOSIS — N644 Mastodynia: Secondary | ICD-10-CM | POA: Diagnosis present

## 2017-02-10 LAB — BASIC METABOLIC PANEL
Anion gap: 6 (ref 5–15)
BUN: 12 mg/dL (ref 6–20)
CO2: 24 mmol/L (ref 22–32)
Calcium: 9.3 mg/dL (ref 8.9–10.3)
Chloride: 107 mmol/L (ref 101–111)
Creatinine, Ser: 0.65 mg/dL (ref 0.44–1.00)
GFR calc Af Amer: >60 mL/min (ref >60)
GFR calc non Af Amer: >60 mL/min (ref >60)
Glucose, Bld: 95 mg/dL (ref 65–99)
Potassium: 4 mmol/L (ref 3.5–5.1)
Sodium: 137 mmol/L (ref 135–145)

## 2017-02-10 LAB — CBC
HCT: 37.9 % (ref 36.0–46.0)
Hemoglobin: 12.6 g/dL (ref 12.0–15.0)
MCH: 32.4 pg (ref 26.0–34.0)
MCHC: 33.2 g/dL (ref 30.0–36.0)
MCV: 97.4 fL (ref 78.0–100.0)
Platelets: 403 10*3/uL — ABNORMAL HIGH (ref 150–400)
RBC: 3.89 MIL/uL (ref 3.87–5.11)
RDW: 12.9 % (ref 11.5–15.5)
WBC: 10.4 10*3/uL (ref 4.0–10.5)

## 2017-02-10 LAB — SEDIMENTATION RATE: Sed Rate: 51 mm/hr — ABNORMAL HIGH (ref 0–22)

## 2017-02-10 MED ORDER — HYDROCORTISONE 1 % EX CREA: 1.0000 "application " | TOPICAL_CREAM | Freq: Two times a day (BID) | CUTANEOUS | 0 refills | Status: DC

## 2017-02-10 MED ORDER — HYDROCODONE-ACETAMINOPHEN 5-325 MG PO TABS
1.0000 | ORAL_TABLET | Freq: Once | ORAL | Status: AC
  Administered 2017-02-10: 1 via ORAL
  Filled 2017-02-10: qty 1

## 2017-02-10 MED ORDER — HYDROCODONE-ACETAMINOPHEN 5-325 MG PO TABS: 1.0000 | ORAL_TABLET | Freq: Four times a day (QID) | ORAL | 0 refills | Status: DC | PRN

## 2017-02-10 MED ORDER — IBUPROFEN 800 MG PO TABS: 800.0000 mg | ORAL_TABLET | Freq: Three times a day (TID) | ORAL | 0 refills | Status: DC

## 2017-02-10 NOTE — ED Triage Notes (Signed)
Patient is complaining of both breast having abscess underneath them. Patient state that she has been on dicyclomine (100 mg) and clindamycin (300 mg). Patient states clindamycin makes her sick.

## 2017-02-10 NOTE — ED Provider Notes (Signed)
[facility] DEPT Provider Note   CSN: [id number] Arrival date & time: 02/10/17  3536     History   Chief Complaint Chief Complaint  Patient presents with  . Abscess    HPI Marisa [name] is a 41 y.o. female presenting for evaluation of bilateral breast pain.  Patient states she has had a 68-monthhistory of breast pain.  She has been evaluated by her primary care in the breast center.  Placed on 2 courses of antibiotics, finished Clindamycin yesterday.  She reports some drainage from the lesions today.  They are continuing to be painful and she feels her symptoms are worsening.  She reports subjective fevers at home.  She has been taking ibuprofen without improvement of pain.  She denies history of similar.  She states her blood sugars have been fluctuating over the past 2 months.  She denies lesions elsewhere.   HPI  Past Medical History:  Diagnosis Date  . Allergy   . Anal pain    chronic  . Anemia   . Anxiety   . Asthma    exacerbation 02-28-2014 and 02-23-2014 secondary to Rhinovirus  . Chronic headaches   . Chronic low back pain   . Cyst of right ovary   . Difficult intravenous access    PER PT NEEDS PICC LINE  . Gait instability   . GERD (gastroesophageal reflux disease)   . History of adenomatous polyp of colon   . History of cardiac arrest    during SVD 1992  . History of ectopic pregnancy    2009-  S/P LEFT SALPINGECTOMY  . History of panic attacks   . IBS (irritable bowel syndrome)   . Lumbar stenosis L4 -- L5 with bulging disk   w/ right leg weakness/ decreased mobility  . Mild obstructive sleep apnea    study 03-20-2014  no cpap recommended  . Neuromuscular disorder (HCC)    neuropathy in feet   . Sleep apnea    mild no cpap  . Type 2 diabetes mellitus ([place]   . Weakness of right leg    FROM BACK PROBLEM PER PT    Patient Active Problem List   Diagnosis Date Noted  . RLQ abdominal pain   . Chronic diarrhea    . Benign neoplasm of transverse colon   . Benign neoplasm of sigmoid colon   . Neuropathic pain of both legs 06/04/2016  . Carbuncle of labium 07/12/2015  . Rash and nonspecific skin eruption 07/12/2015  . Dandruff 03/26/2015  . Nipple discharge in female 03/26/2015  . Migraine variant with headache 05/09/2014  . Unable to ambulate 05/09/2014  . Severe recurrent major depressive disorder with psychotic features ([place] 05/04/2014  . GAD (generalized anxiety disorder) 05/04/2014  . 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  . Social anxiety disorder 05/04/2014  . PTSD (post-traumatic stress disorder) 05/04/2014  . Cigarette nicotine dependence without complication [id number]/[id number] . Gait disturbance 04/11/2014  . Depression 03/27/2014  . Falls 03/27/2014  . OSA (obstructive sleep apnea) 03/06/2014  . Insomnia 03/06/2014  . Anxiety   . History of cardiac arrest   . Acute bronchitis   . Asthma exacerbation 02/20/2014  . Well controlled type 2 diabetes mellitus ([place] 02/20/2014  . Sore throat 02/20/2014  . Chest pain 02/20/2014  . Tachycardia 02/20/2014  . Diabetes mellitus without complication ([place] [id number]/67/6195 . Pelvic pain 07/25/2013  . Rectal bleeding 07/25/2013  . Persistent vomiting 04/27/2013  . Atypical chest pain 04/26/2013  .  Abdominal pain 04/26/2013  . S/P Total vaginal hysterectomy on 01/06/13 01/06/2013  . Dyspnea 09/07/2012  . 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  . Anemia 07/30/2012  . Current smoker 07/30/2012    Past Surgical History:  Procedure Laterality Date  . ABDOMINAL HYSTERECTOMY    . COLONOSCOPY Left 04/29/2013   Procedure: COLONOSCOPY;  Surgeon: Arta Silence, MD;  [facility]: [facility];  Service: Endoscopy;  Laterality: Left;  . COLONOSCOPY    . COLONOSCOPY WITH PROPOFOL N/A 08/01/2016   Procedure: COLONOSCOPY WITH PROPOFOL;  Surgeon: Doran Stabler, MD;  Location: [facility];  Service: Gastroenterology;  Laterality: N/A;  . EVALUATION UNDER  ANESTHESIA WITH FISTULECTOMY N/A 04/20/2014   Procedure: EXAM UNDER ANESTHESIA ;  Surgeon: Leighton Ruff, MD;  Location[facility];  Service: General;  Laterality: N/A;  . FLEXIBLE SIGMOIDOSCOPY N/A 11/09/2013   Procedure: FLEXIBLE SIGMOIDOSCOPY;  Surgeon: Arta Silence, MD;  Location: [facility];  Service: Endoscopy;  Laterality: N/A;  . LAPAROSCOPIC CHOLECYSTECTOMY  2005  . SPHINCTEROTOMY N/A 04/20/2014   Procedure:  LATERAL INTERNAL SPHINCTEROTOMY;  Surgeon: Leighton Ruff, MD;  [facility];  Service: General;  Laterality: N/A;  . TRANSTHORACIC ECHOCARDIOGRAM  12-30-2012   mild LVH/  ef 55-60%  . UNILATERAL SALPINGECTOMY  2009   laparotomy left salpingectomy-- ectopic preg.  Marland Kitchen UPPER GASTROINTESTINAL ENDOSCOPY    . VAGINAL HYSTERECTOMY N/A 01/06/2013   Procedure: HYSTERECTOMY VAGINAL;  Surgeon: Osborne Oman, MD;  Location: [facility];  Service: Gynecology;  Laterality: N/A;    OB History    Gravida Para Term Preterm AB Living   '3 1 1   2 1   ' SAB TAB Ectopic Multiple Live Births     1 1           Home Medications    Prior to Admission medications   Medication Sig Start Date End Date Taking? Authorizing Provider  albuterol (PROVENTIL HFA;VENTOLIN HFA) 108 (90 Base) MCG/ACT inhaler Inhale 1-2 puffs into the lungs every 6 (six) hours as needed for wheezing or shortness of breath. 12/17/16  Yes Tresa Garter, MD  clindamycin (CLEOCIN) 300 MG capsule Take 300 mg by mouth 3 (three) times daily. Started 12/12 for 7 days   Yes [provider]  ergocalciferol (VITAMIN D2) 50000 units capsule Take 1 capsule (50,000 Units total) by mouth once a week. 12/17/16  Yes Tresa Garter, MD  famotidine (PEPCID) 20 MG tablet Take 1 tablet (20 mg total) by mouth 2 (two) times daily. Patient taking differently: Take 20 mg by mouth daily.  06/11/16  Yes Kirichenko, Tatyana, PA-C  gabapentin (NEURONTIN) 300 MG capsule Take 1 capsule (300 mg  total) by mouth 3 (three) times daily. Patient taking differently: Take 300 mg by mouth 2 (two) times daily.  12/17/16  Yes Tresa Garter, MD  metFORMIN (GLUCOPHAGE) 500 MG tablet Take 1 tablet (500 mg total) by mouth 2 (two) times daily with a meal. Patient taking differently: Take 500 mg by mouth 3 (three) times daily after meals.  04/16/16  Yes Jegede, Marlena Clipper, MD  mometasone-formoterol (DULERA) 100-5 MCG/ACT AERO Inhale 2 puffs into the lungs 2 (two) times daily. Patient taking differently: Inhale 2 puffs into the lungs every other day.  12/17/16  Yes Tresa Garter, MD  SUMAtriptan (IMITREX) 25 MG tablet Take 1 tablet (25 mg total) by mouth once. May repeat in 2 hours if headache persists or recurs. 12/17/16 02/10/17 Yes Tresa Garter, MD  traZODone ([place])  50 MG tablet Take 0.5-1 tablets (25-50 mg total) by mouth at bedtime as needed for sleep. 12/17/16  Yes Tresa Garter, MD  HYDROcodone-acetaminophen (NORCO) 5-325 MG tablet Take 1 tablet by mouth every 6 (six) hours as needed for moderate pain. 02/10/17   Germaine Shenker, PA-C  hydrocortisone cream 1 % Apply 1 application topically 2 (two) times daily. 02/10/17   Lanaya Bennis, PA-C  ibuprofen (ADVIL,MOTRIN) 800 MG tablet Take 1 tablet (800 mg total) by mouth 3 (three) times daily with meals. 02/10/17   Damarcus Reggio, PA-C  ketorolac (ACULAR) 0.5 % ophthalmic solution Place 1 drop into the right eye every 6 (six) hours. Patient not taking: Reported on 10/16/2016 09/04/16   Virgel Manifold, MD  nystatin cream (MYCOSTATIN) Apply 1 application topically 2 (two) times daily. Patient not taking: Reported on 02/10/2017 12/17/16   Tresa Garter, MD    Family History Family History  Problem Relation Age of Onset  . Hypertension Mother   . Diabetes Mother   . Allergies Mother   . Heart disease Mother   . Clotting disorder Mother   . Cancer Father   . Hyperlipidemia Father   . Hypertension  Father   . Colon cancer Father   . Heart disease Maternal Grandmother   . Breast cancer Maternal Grandmother 58  . Schizophrenia Sister   . Bipolar disorder Sister   . Clotting disorder Sister   . Bipolar disorder Brother   . Kidney disease Brother   . Bipolar disorder Sister   . Pancreatic cancer Maternal Aunt   . Prostate cancer Maternal Uncle   . Liver cancer Maternal Grandfather   . Rectal cancer Maternal Grandfather   . Liver cancer Paternal Grandfather   . Colon polyps Neg Hx   . Esophageal cancer Neg Hx   . Stomach cancer Neg Hx     Social History Social History   Tobacco Use  . Smoking status: Current Some Day Smoker    Packs/day: 0.20    Years: 11.00    Pack years: 2.20    Types: Cigarettes    Last attempt to quit: 11/17/2013    Years since quitting: 3.2  . Smokeless tobacco: Never Used  . Tobacco comment: 1 pack lasts 3 weeks   Substance Use Topics  . Alcohol use: No    Alcohol/week: 0.0 oz  . Drug use: No     Allergies   Asa [aspirin]; Mushroom extract complex; Penicillins; Shellfish allergy; and Triamcinolone   Review of Systems Review of Systems  Constitutional: Positive for fever (subjective). Negative for activity change and appetite change.  HENT: Negative for congestion.   Respiratory: Negative for cough and shortness of breath.   Cardiovascular: Negative for chest pain.  Gastrointestinal: Negative for abdominal pain, nausea and vomiting.  Genitourinary: Negative for dysuria, frequency and hematuria.  Musculoskeletal: Negative for back pain.  Skin: Positive for color change.       Bilateral tender breast lesions.   Allergic/Immunologic: Negative for immunocompromised state.  Neurological: Negative for dizziness and headaches.  Hematological: Does not bruise/bleed easily.    Physical Exam Updated Vital Signs BP 107/80 (BP Location: Right Arm)   Pulse 73   Temp 98.3 F (36.8 C) (Oral)   Resp 16   Ht '5\' 5"'  (1.651 m)   Wt 99.8 kg (220  lb)   LMP 11/27/2012   SpO2 97%   BMI 36.61 kg/m   Physical Exam  Constitutional: She is oriented to person, place, and time. She  appears well-developed and well-nourished. No distress.  HENT:  Head: Normocephalic and atraumatic.  Eyes: EOM are normal.  Neck: Normal range of motion.  Cardiovascular: Normal rate, regular rhythm and intact distal pulses.  Pulmonary/Chest: Effort normal and breath sounds normal. No respiratory distress. She has no wheezes.  Abdominal: Soft. She exhibits no distension. There is no tenderness.  Musculoskeletal: Normal range of motion.  Neurological: She is alert and oriented to person, place, and time.  Skin: Skin is warm and dry. There is erythema.  Bilateral breast lesions. Located under the breast, 6 o'clock position. About 2 cm x 2 cm.  No fluctuance or induration noted.  Entire breasts are tender.  Minimal sanguinous drainage from both lesions.  No lesions noted elsewhere.  No involvement in the areola.   Psychiatric: She has a normal mood and affect.     ED Treatments / Results  Labs (all labs ordered are listed, but only abnormal results are displayed) Labs Reviewed  CBC - Abnormal; Notable for the following components:      Result Value   Platelets 403 (*)    All other components within normal limits  SEDIMENTATION RATE - Abnormal; Notable for the following components:   Sed Rate 51 (*)    All other components within normal limits  BASIC METABOLIC PANEL    EKG  EKG Interpretation None       Radiology No results found.  Procedures Procedures (including critical care time)  Medications Ordered in ED Medications  HYDROcodone-acetaminophen (NORCO/VICODIN) 5-325 MG per tablet 1 tablet (1 tablet Oral Given 02/10/17 0739)     Initial Impression / Assessment and Plan / ED Course  I have reviewed the triage vital signs and the nursing notes.  Pertinent labs & imaging results that were available during my care of the patient were  reviewed by me and considered in my medical decision making (see chart for details).     Patient presenting for evaluation of bilateral breast lesions.  They are tender and draining today.  Physical exam shows patient is afebrile not tachycardic.  Lesions are tender without fluctuance or induration.  Doubt abscess.  Serosanguineous drainage, no purulence.  Per chart review, ultrasound several weeks ago showed decreased concern for malignancy, possible concern for hidradenitis suppurativa.  Will obtain basic lab work to evaluate for leukocytosis and other abnormalities.  ESR ordered.  Norco for pain.  Case discussed with attending, Dr. Roxanne Mins evaluated the patient.  Bedside ultrasound did not show abscess or other obvious infection.  Labs reassuring, no leukocytosis.  ESR elevated at 51, likely inflammatory process.  Discussed findings with patient.  Will manage pain with ibuprofen and Norco as needed.  Hydrocortisone 1% given for inflammation.  Dermatology information given.  At this time, patient appears safe for discharge.  Return precautions given.  Patient states she understands and agrees to plan.   Final Clinical Impressions(s) / ED Diagnoses   Final diagnoses:  Skin lesion of breast    ED Discharge Orders        Ordered    hydrocortisone cream 1 %  2 times daily     02/10/17 0859    HYDROcodone-acetaminophen (NORCO) 5-325 MG tablet  Every 6 hours PRN     02/10/17 0859    ibuprofen (ADVIL,MOTRIN) 800 MG tablet  3 times daily with meals     02/10/17 0859       Franchot Heidelberg, PA-C 66/29/47 6546    Delora Fuel, MD 50/35/46 2300  Delora Fuel, MD 55/00/16 2300

## 2017-02-10 NOTE — Discharge Instructions (Signed)
Take ibuprofen 3 times a day with meals.  Do not take other anti-inflammatories at the same time open (Advil, Motrin, naproxen, Aleve).  Take norco as needed for breakthrough or severe pain.  Use ice packs or heating pads if this helps control your pain. Use the hydrocortisone cream 2 times daily on the painful areas.  It is important that you follow up with the dermatologist.  Return to the ER if you develop worsening pain, persistent high fevers, or any new or concerning symptoms.

## 2017-02-20 ENCOUNTER — Telehealth: Payer: Self-pay | Admitting: *Deleted

## 2017-02-20 MED ORDER — DOXYCYCLINE HYCLATE 100 MG PO TABS: 100.0000 mg | ORAL_TABLET | Freq: Two times a day (BID) | ORAL | 0 refills | Status: DC

## 2017-02-20 NOTE — Telephone Encounter (Signed)
Patient verified DOB Patient states she now has two holes oozing green discharge. Patient is aware of no dermatology locally accepting medicaid. Patient agrees to be referred to [place] if available. Patient is aware of covering provider reviewing the concern for possible antibiotic treatment until specialist appointment is scheduled.

## 2017-02-20 NOTE — Telephone Encounter (Signed)
Patient reports being on 3 antibiotics with no relief. Patient is aware of now needing OV for further evaluation with PCP

## 2017-02-20 NOTE — Telephone Encounter (Signed)
I have sent a prescription for doxycycline to her pharmacy on file.  Please advise to schedule appointment with PCP.Marland Kitchen

## 2017-03-09 ENCOUNTER — Telehealth: Payer: Self-pay | Admitting: *Deleted

## 2017-03-09 ENCOUNTER — Encounter (HOSPITAL_COMMUNITY): Payer: Self-pay | Admitting: Emergency Medicine

## 2017-03-09 ENCOUNTER — Emergency Department (HOSPITAL_COMMUNITY)
Admission: EM | Admit: 2017-03-09 | Discharge: 2017-03-10 | Disposition: A | Discharge status: Home / Self Care | Payer: Medicaid Other | Attending: Emergency Medicine | Admitting: Emergency Medicine

## 2017-03-09 ENCOUNTER — Emergency Department (HOSPITAL_COMMUNITY): Payer: Medicaid Other

## 2017-03-09 ENCOUNTER — Other Ambulatory Visit: Payer: Self-pay

## 2017-03-09 DIAGNOSIS — F17210 Nicotine dependence, cigarettes, uncomplicated: Secondary | ICD-10-CM | POA: Insufficient documentation

## 2017-03-09 DIAGNOSIS — Z79899 Other long term (current) drug therapy: Secondary | ICD-10-CM | POA: Insufficient documentation

## 2017-03-09 DIAGNOSIS — E11622 Type 2 diabetes mellitus with other skin ulcer: Secondary | ICD-10-CM | POA: Diagnosis not present

## 2017-03-09 DIAGNOSIS — Z7984 Long term (current) use of oral hypoglycemic drugs: Secondary | ICD-10-CM | POA: Diagnosis not present

## 2017-03-09 DIAGNOSIS — S21001A Unspecified open wound of right breast, initial encounter: Secondary | ICD-10-CM

## 2017-03-09 DIAGNOSIS — L98499 Non-pressure chronic ulcer of skin of other sites with unspecified severity: Secondary | ICD-10-CM | POA: Diagnosis present

## 2017-03-09 DIAGNOSIS — R55 Syncope and collapse: Secondary | ICD-10-CM | POA: Insufficient documentation

## 2017-03-09 DIAGNOSIS — J45909 Unspecified asthma, uncomplicated: Secondary | ICD-10-CM | POA: Insufficient documentation

## 2017-03-09 DIAGNOSIS — S21002A Unspecified open wound of left breast, initial encounter: Secondary | ICD-10-CM

## 2017-03-09 LAB — CBC WITH DIFFERENTIAL/PLATELET
Basophils Absolute: 0 10*3/uL (ref 0.0–0.1)
Basophils Relative: 0 %
Eosinophils Absolute: 0.1 10*3/uL (ref 0.0–0.7)
Eosinophils Relative: 2 %
HCT: 38.8 % (ref 36.0–46.0)
Hemoglobin: 12.9 g/dL (ref 12.0–15.0)
Lymphocytes Relative: 47 %
Lymphs Abs: 3.3 10*3/uL (ref 0.7–4.0)
MCH: 32.1 pg (ref 26.0–34.0)
MCHC: 33.2 g/dL (ref 30.0–36.0)
MCV: 96.5 fL (ref 78.0–100.0)
Monocytes Absolute: 0.5 10*3/uL (ref 0.1–1.0)
Monocytes Relative: 7 %
Neutro Abs: 3.1 10*3/uL (ref 1.7–7.7)
Neutrophils Relative %: 44 %
Platelets: 361 10*3/uL (ref 150–400)
RBC: 4.02 MIL/uL (ref 3.87–5.11)
RDW: 12 % (ref 11.5–15.5)
WBC: 7 10*3/uL (ref 4.0–10.5)

## 2017-03-09 LAB — BASIC METABOLIC PANEL
Anion gap: 7 (ref 5–15)
BUN: 11 mg/dL (ref 6–20)
CO2: 24 mmol/L (ref 22–32)
Calcium: 9.2 mg/dL (ref 8.9–10.3)
Chloride: 108 mmol/L (ref 101–111)
Creatinine, Ser: 0.83 mg/dL (ref 0.44–1.00)
GFR calc Af Amer: >60 mL/min (ref >60)
GFR calc non Af Amer: >60 mL/min (ref >60)
Glucose, Bld: 99 mg/dL (ref 65–99)
Potassium: 3.6 mmol/L (ref 3.5–5.1)
Sodium: 139 mmol/L (ref 135–145)

## 2017-03-09 LAB — CBG MONITORING, ED: Glucose-Capillary: 89 mg/dL (ref 65–99)

## 2017-03-09 MED ORDER — MORPHINE SULFATE (PF) 4 MG/ML IV SOLN
4.0000 mg | Freq: Once | INTRAVENOUS | Status: AC
  Administered 2017-03-09: 4 mg via INTRAVENOUS
  Filled 2017-03-09: qty 1

## 2017-03-09 MED ORDER — MORPHINE SULFATE (PF) 4 MG/ML IV SOLN
4.0000 mg | Freq: Once | INTRAVENOUS | Status: AC
  Administered 2017-03-10: 4 mg via INTRAVENOUS
  Filled 2017-03-09: qty 1

## 2017-03-09 MED ORDER — IOPAMIDOL (ISOVUE-300) INJECTION 61%
INTRAVENOUS | Status: AC
  Administered 2017-03-09: 75 mL
  Filled 2017-03-09: qty 75

## 2017-03-09 MED ORDER — SULFAMETHOXAZOLE-TRIMETHOPRIM 800-160 MG PO TABS
1.0000 | ORAL_TABLET | Freq: Once | ORAL | Status: AC
  Administered 2017-03-10: 1 via ORAL
  Filled 2017-03-09: qty 1

## 2017-03-09 MED ORDER — SODIUM CHLORIDE 0.9 % IV BOLUS (SEPSIS)
1000.0000 mL | Freq: Once | INTRAVENOUS | Status: AC
  Administered 2017-03-09: 1000 mL via INTRAVENOUS

## 2017-03-09 NOTE — Telephone Encounter (Signed)
Call was transferred from [facility]. Pt has concerns about worsening breast pain.  Pt denies fever. States she has holes in her breast, yellowish- greenish drainage with odor, and bloody at times. Pain makes her nauseated. Has not had relief with Ibuprofen or taking 1/2 table of Hydrocodone.  Check on referral that was placed. The Dermatology offices of [facility] and  [place] does not accept Medicaid. Alinda Sierras will send to [facility] in [place].

## 2017-03-09 NOTE — Telephone Encounter (Addendum)
Pt informed that dermatology office in which referrals were made does not accept Medicaid. She would  Be able to be seen there but would have to pay out of pocket. OV could range for [phone].  Pt stated she was aware of this already.  Advised patient to call medicaid number to find an Dermatologist that accepts Medicaid.   Pt states she is in so much pain and pieces of her breast tissue are falling off. States she has noticed this "Flesh eating bacteria" get worse, and no one can do anything about it. Advised patient to seek immediate care and evaluation due to aforementioned symptoms.

## 2017-03-09 NOTE — ED Triage Notes (Signed)
Pt from home with c/o abscesses under both breasts which has been ongoing since Dec. Pt states she finished antibiotics  Week ago. Pt has been using ibuprofen for pain with minimal success. Pt states she experienced syncope. Pt reports lightheadedness since the ulcers were first noticed. Syncope was unwitnessed. Pt has left sided neck tenderness.   20 G lt hand

## 2017-03-09 NOTE — ED Notes (Signed)
Dr.Issacs at beside.

## 2017-03-09 NOTE — ED Provider Notes (Signed)
[place] DEPT Provider Note   CSN: [id number] Arrival date & time: 03/09/17  1924     History   Chief Complaint Chief Complaint  Patient presents with  . Abscess  . Loss of Consciousness    HPI Marisa [name] is a 42 y.o. female with history of asthma, chronic headaches, chronic low back pain, GERD, IBS, panic attacks, DM, neuropathy, and sleep apnea presents today with chief complaint acute onset, progressively worsening area of tenderness and ulceration to the underside of bilateral breasts for several months.  She states that she has been seen in the ED for this in the past as well as her primary care physician and has been put on a number of different antibiotics including clindamycin, and most recently a 2-week course of doxycycline which she completed 1 week ago.  She states that she has been experience at least 3 episodes of watery nonbloody stools for several months.  Denies abdominal pain endorses nausea but no vomiting.  Denies fevers or chills.  She states that the lesions under her bilateral breasts have grown larger and "pieces of breast tissue are falling off ".  States that her breasts have been draining a green fluid.  She has been trying to follow-up with dermatology but no specialists take Medicaid.  She underwent ultrasound 01/28/17 with findings consistent with inflammation or infection, possibly hidradenitis.  No evidence of malignancy.  Today she called her primary care physician and noted that her pain has continued to worsen and they recommended presentation to the ED for further evaluation and possible IV antibiotics.  She has been taking ibuprofen and Norco without relief of her symptoms.  She states her sugars have been well controlled.  Earlier today she states that she was walking around her bedroom when she began to feel very lightheaded secondary to pain and had an episode of loss of consciousness of unknown duration.  She  denies head injury but states that she fell on her right side and is experience in right-sided low back pain which worsens with ambulation.  She states that it is sharp and aching in nature.  Denies bowel or bladder incontinence or saddle anesthesia.   The history is provided by the patient.    Past Medical History:  Diagnosis Date  . Allergy   . Anal pain    chronic  . Anemia   . Anxiety   . Asthma    exacerbation 02-28-2014 and 02-23-2014 secondary to Rhinovirus  . Chronic headaches   . Chronic low back pain   . Cyst of right ovary   . Difficult intravenous access    PER PT NEEDS PICC LINE  . Gait instability   . GERD (gastroesophageal reflux disease)   . History of adenomatous polyp of colon   . History of cardiac arrest    during SVD 1992  . History of ectopic pregnancy    2009-  S/P LEFT SALPINGECTOMY  . History of panic attacks   . IBS (irritable bowel syndrome)   . Lumbar stenosis L4 -- L5 with bulging disk   w/ right leg weakness/ decreased mobility  . Mild obstructive sleep apnea    study 03-20-2014  no cpap recommended  . Neuromuscular disorder (HCC)    neuropathy in feet   . Sleep apnea    mild no cpap  . Type 2 diabetes mellitus ([place])   . Weakness of right leg    FROM BACK PROBLEM PER PT  Patient Active Problem List   Diagnosis Date Noted  . RLQ abdominal pain   . Chronic diarrhea   . Benign neoplasm of transverse colon   . Benign neoplasm of sigmoid colon   . Neuropathic pain of both legs 06/04/2016  . Carbuncle of labium 07/12/2015  . Rash and nonspecific skin eruption 07/12/2015  . Dandruff 03/26/2015  . Nipple discharge in female 03/26/2015  . Migraine variant with headache 05/09/2014  . Unable to ambulate 05/09/2014  . Severe recurrent major depressive disorder with psychotic features ([place]) 05/04/2014  . GAD (generalized anxiety disorder) 05/04/2014  . 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  . Social anxiety disorder 05/04/2014  . PTSD  (post-traumatic stress disorder) 05/04/2014  . Cigarette nicotine dependence without complication 15/17/6160  . Gait disturbance 04/11/2014  . Depression 03/27/2014  . Falls 03/27/2014  . OSA (obstructive sleep apnea) 03/06/2014  . Insomnia 03/06/2014  . Anxiety   . History of cardiac arrest   . Acute bronchitis   . Asthma exacerbation 02/20/2014  . Well controlled type 2 diabetes mellitus ([place]) 02/20/2014  . Sore throat 02/20/2014  . Chest pain 02/20/2014  . Tachycardia 02/20/2014  . Diabetes mellitus without complication ([place]) 73/71/0626  . Pelvic pain 07/25/2013  . Rectal bleeding 07/25/2013  . Persistent vomiting 04/27/2013  . Atypical chest pain 04/26/2013  . Abdominal pain 04/26/2013  . S/P Total vaginal hysterectomy on 01/06/13 01/06/2013  . Dyspnea 09/07/2012  . 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  . Anemia 07/30/2012  . Current smoker 07/30/2012    Past Surgical History:  Procedure Laterality Date  . ABDOMINAL HYSTERECTOMY    . COLONOSCOPY Left 04/29/2013   Procedure: COLONOSCOPY;  Surgeon: Arta Silence, MD;  Location: [facility];  Service: Endoscopy;  Laterality: Left;  . COLONOSCOPY    . COLONOSCOPY WITH PROPOFOL N/A 08/01/2016   Procedure: COLONOSCOPY WITH PROPOFOL;  Surgeon: Doran Stabler, MD;  Location: [facility];  Service: Gastroenterology;  Laterality: N/A;  . EVALUATION UNDER ANESTHESIA WITH FISTULECTOMY N/A 04/20/2014   Procedure: EXAM UNDER ANESTHESIA ;  Surgeon: Leighton Ruff, MD;  Location: [facility];  Service: General;  Laterality: N/A;  . FLEXIBLE SIGMOIDOSCOPY N/A 11/09/2013   Procedure: FLEXIBLE SIGMOIDOSCOPY;  Surgeon: Arta Silence, MD;  Location: [facility];  Service: Endoscopy;  Laterality: N/A;  . LAPAROSCOPIC CHOLECYSTECTOMY  2005  . SPHINCTEROTOMY N/A 04/20/2014   Procedure:  LATERAL INTERNAL SPHINCTEROTOMY;  Surgeon: Leighton Ruff, MD;  Location[facility];  Service: General;  Laterality: N/A;  .  TRANSTHORACIC ECHOCARDIOGRAM  12-30-2012   mild LVH/  ef 55-60%  . UNILATERAL SALPINGECTOMY  2009   laparotomy left salpingectomy-- ectopic preg.  Marland Kitchen UPPER GASTROINTESTINAL ENDOSCOPY    . VAGINAL HYSTERECTOMY N/A 01/06/2013   Procedure: HYSTERECTOMY VAGINAL;  Surgeon: Osborne Oman, MD;  Location: [facility];  Service: Gynecology;  Laterality: N/A;    OB History    Gravida Para Term Preterm AB Living   3 1 1   2 1    SAB TAB Ectopic Multiple Live Births     1 1           Home Medications    Prior to Admission medications   Medication Sig Start Date End Date Taking? Authorizing Provider  albuterol (PROVENTIL HFA;VENTOLIN HFA) 108 (90 Base) MCG/ACT inhaler Inhale 1-2 puffs into the lungs every 6 (six) hours as needed for wheezing or shortness of breath. 12/17/16  Yes Tresa Garter, MD  ergocalciferol (VITAMIN D2) 50000 units capsule Take  1 capsule (50,000 Units total) by mouth once a week. 12/17/16  Yes Tresa Garter, MD  famotidine (PEPCID) 20 MG tablet Take 1 tablet (20 mg total) by mouth 2 (two) times daily. Patient taking differently: Take 20 mg by mouth daily.  06/11/16  Yes Kirichenko, Tatyana, PA-C  gabapentin (NEURONTIN) 300 MG capsule Take 1 capsule (300 mg total) by mouth 3 (three) times daily. Patient taking differently: Take 300 mg by mouth 2 (two) times daily.  12/17/16  Yes Tresa Garter, MD  hydrocortisone cream 1 % Apply 1 application topically 2 (two) times daily. 02/10/17  Yes Caccavale, Sophia, PA-C  ibuprofen (ADVIL,MOTRIN) 800 MG tablet Take 1 tablet (800 mg total) by mouth 3 (three) times daily with meals. 02/10/17  Yes Caccavale, Sophia, PA-C  metFORMIN (GLUCOPHAGE) 500 MG tablet Take 1 tablet (500 mg total) by mouth 2 (two) times daily with a meal. Patient taking differently: Take 500 mg by mouth 3 (three) times daily after meals.  04/16/16  Yes Jegede, Marlena Clipper, MD  mometasone-formoterol (DULERA) 100-5 MCG/ACT AERO Inhale 2 puffs into the  lungs 2 (two) times daily. Patient taking differently: Inhale 2 puffs into the lungs every other day.  12/17/16  Yes Tresa Garter, MD  traZODone (DESYREL) 50 MG tablet Take 0.5-1 tablets (25-50 mg total) by mouth at bedtime as needed for sleep. Patient taking differently: Take 50 mg by mouth at bedtime as needed for sleep.  12/17/16  Yes Tresa Garter, MD  clindamycin (CLEOCIN) 300 MG capsule Take 300 mg by mouth 3 (three) times daily. Started 12/12 for 7 days    [provider]  doxycycline (VIBRA-TABS) 100 MG tablet Take 1 tablet (100 mg total) by mouth 2 (two) times daily. Patient not taking: Reported on 03/09/2017 02/20/17   Arnoldo Morale, MD  HYDROcodone-acetaminophen (NORCO/VICODIN) 5-325 MG tablet Take 1 tablet by mouth every 6 (six) hours as needed for severe pain. 03/10/17   Nils Flack, Deaundra Dupriest A, PA-C  ketorolac (ACULAR) 0.5 % ophthalmic solution Place 1 drop into the right eye every 6 (six) hours. Patient not taking: Reported on 10/16/2016 09/04/16   Virgel Manifold, MD  mupirocin cream (BACTROBAN) 2 % Apply 1 application topically 2 (two) times daily. 03/10/17   Amarian Botero A, PA-C  nystatin cream (MYCOSTATIN) Apply 1 application topically 2 (two) times daily. Patient not taking: Reported on 02/10/2017 12/17/16   Tresa Garter, MD  sulfamethoxazole-trimethoprim (BACTRIM DS,SEPTRA DS) 800-160 MG tablet Take 1 tablet by mouth 2 (two) times daily for 10 days. 03/10/17 03/20/17  Rodell Perna A, PA-C  SUMAtriptan (IMITREX) 25 MG tablet Take 1 tablet (25 mg total) by mouth once. May repeat in 2 hours if headache persists or recurs. 12/17/16 02/10/17  Tresa Garter, MD    Family History Family History  Problem Relation Age of Onset  . Hypertension Mother   . Diabetes Mother   . Allergies Mother   . Heart disease Mother   . Clotting disorder Mother   . Cancer Father   . Hyperlipidemia Father   . Hypertension Father   . Colon cancer Father   . Heart disease Maternal  Grandmother   . Breast cancer Maternal Grandmother 22  . Schizophrenia Sister   . Bipolar disorder Sister   . Clotting disorder Sister   . Bipolar disorder Brother   . Kidney disease Brother   . Bipolar disorder Sister   . Pancreatic cancer Maternal Aunt   . Prostate cancer Maternal Uncle   .  Liver cancer Maternal Grandfather   . Rectal cancer Maternal Grandfather   . Liver cancer Paternal Grandfather   . Colon polyps Neg Hx   . Esophageal cancer Neg Hx   . Stomach cancer Neg Hx     Social History Social History   Tobacco Use  . Smoking status: Current Some Day Smoker    Packs/day: 0.20    Years: 11.00    Pack years: 2.20    Types: Cigarettes    Last attempt to quit: 11/17/2013    Years since quitting: 3.3  . Smokeless tobacco: Never Used  . Tobacco comment: 1 pack lasts 3 weeks   Substance Use Topics  . Alcohol use: No    Alcohol/week: 0.0 oz  . Drug use: No     Allergies   Asa [aspirin]; Mushroom extract complex; Penicillins; Shellfish allergy; and Triamcinolone   Review of Systems Review of Systems  Constitutional: Negative for chills and fever.  Eyes: Negative for visual disturbance.  Respiratory: Negative for shortness of breath.   Cardiovascular: Negative for chest pain.  Gastrointestinal: Positive for diarrhea and nausea. Negative for abdominal pain, blood in stool, constipation and vomiting.  Musculoskeletal: Positive for back pain. Negative for neck pain.  Skin: Positive for wound.  Neurological: Positive for syncope and light-headedness. Negative for weakness, numbness and headaches.  All other systems reviewed and are negative.    Physical Exam Updated Vital Signs BP 127/89   Pulse (!) 57   Temp (!) 97.4 F (36.3 C) (Oral)   Resp 19   Ht 5\' 5"  (1.651 m)   Wt 108.9 kg (240 lb)   LMP 11/27/2012   SpO2 96%   BMI 39.94 kg/m    Physical Exam  Constitutional: She is oriented to person, place, and time. She appears well-developed and  well-nourished. No distress.  Appears uncomfortable  HENT:  Head: Normocephalic and atraumatic.  No Battle's signs, no raccoon's eyes, no rhinorrhea. No hemotympanum. No tenderness to palpation of the face or skull. No deformity, crepitus, or swelling noted.   Eyes: Conjunctivae and EOM are normal. Pupils are equal, round, and reactive to light. Right eye exhibits no discharge. Left eye exhibits no discharge.  Neck: Normal range of motion. Neck supple. No JVD present. No tracheal deviation present.  No midline spine TTP, no paraspinal muscle tenderness, no deformity, crepitus, or step-off noted   Cardiovascular: Normal rate, regular rhythm, normal heart sounds and intact distal pulses.  Pulmonary/Chest: Effort normal and breath sounds normal. No stridor. No respiratory distress. She has no wheezes. She has no rales.  Abdominal: Soft. Bowel sounds are normal. She exhibits no distension. There is no tenderness. There is no guarding.  Musculoskeletal: Normal range of motion. She exhibits tenderness. She exhibits no edema.  No midline spine tenderness to palpation, right paralumbar muscle tenderness with no deformity, crepitus, or step-off noted.  5/5 strength of BUE and BLE major muscle groups.  Neurological: She is alert and oriented to person, place, and time. No cranial nerve deficit or sensory deficit. She exhibits normal muscle tone.  Mental Status:  Alert, thought content appropriate, able to give a coherent history. Speech fluent without evidence of aphasia. Able to follow 2 step commands without difficulty.  Cranial Nerves:  II:  pupils equal, round, reactive to light III,IV, VI: ptosis not present, extra-ocular motions intact bilaterally  V,VII: smile symmetric, facial light touch sensation equal VIII: hearing grossly normal to voice  X: uvula elevates symmetrically  XI: bilateral shoulder shrug symmetric  and strong XII: midline tongue extension without fassiculations Motor:  Normal  tone. 5/5 strength of BUE and BLE major muscle groups including strong and equal grip strength and dorsiflexion/plantar flexion Sensory: light touch normal in all extremities. Cerebellar: normal finger-to-nose with bilateral upper extremities Gait: normal balance, mildly antalgic gait.  CV: 2+ radial and DP/PT pulses   Skin: Skin is warm and dry. No erythema.  examination performed in the presence of a chaperone.  Skin ulcerations and darkening of the skin noted under the bilateral breasts.  Underside of right breast with 6 cm x 3cm area of darkened skin with a central 2 cm ulceration with mild drainage of white-yellow fluid.    Underside of left breast with 8cm x 4cm annular area of darkened skin with multiple central skin ulcerations weeping serous fluid.  Diffuse tenderness to palpation of the bilateral breast with no significant mass noted.  No induration or fluctuance noted.    Psychiatric: She has a normal mood and affect. Her behavior is normal.  Nursing note and vitals reviewed.    ED Treatments / Results  Labs (all labs ordered are listed, but only abnormal results are displayed) Labs Reviewed  URINALYSIS, ROUTINE W REFLEX MICROSCOPIC - Abnormal; Notable for the following components:      Result Value   Specific Gravity, Urine 1.039 (*)    Hgb urine dipstick SMALL (*)    Squamous Epithelial / LPF 0-5 (*)    All other components within normal limits  GASTROINTESTINAL PANEL BY PCR, STOOL (REPLACES STOOL CULTURE)  C DIFFICILE QUICK SCREEN W PCR REFLEX  BASIC METABOLIC PANEL  CBC WITH DIFFERENTIAL/PLATELET  CBG MONITORING, ED    EKG  EKG Interpretation None       Radiology Ct Chest W Contrast  Result Date: 03/09/2017 CLINICAL DATA:  Infection of the breast associated with pregnancy. EXAM: CT CHEST WITH CONTRAST TECHNIQUE: Multidetector CT imaging of the chest was performed during intravenous contrast administration. CONTRAST:  12mL ISOVUE-300 IOPAMIDOL  (ISOVUE-300) INJECTION 61% COMPARISON:  None. FINDINGS: Cardiovascular: Study was not targeted for assessment of the pulmonary arteries. Heart size is top normal without pericardial effusion. No large central pulmonary embolus is noted. No aortic aneurysm or dissection. Normal branch pattern of the great vessels. Mediastinum/Nodes: 8 mm hypodense nodule in the left lobe of the thyroid gland without worrisome features. Lungs/Pleura: Subsegmental atelectasis within both lower lobes. No pneumonic consolidation, effusion or pneumothorax. Upper Abdomen: Cholecystectomy.  No acute abnormality. Musculoskeletal: No chest wall abnormality. No enhancing fluid collections within either breast. No dominant mass is seen. No acute or significant osseous findings. IMPRESSION: Dependent subsegmental atelectasis within both lower lobes. No active pulmonary disease. No large central pulmonary embolus. No abscess or drainable fluid collections. 8 mm hypodense left thyroid nodule. No additional workup necessary given size and demographics. Electronically Signed   By: Ashley Royalty M.D.   On: 03/09/2017 23:18    Procedures Procedures (including critical care time)  Medications Ordered in ED Medications  sodium chloride 0.9 % bolus 1,000 mL (0 mLs Intravenous Stopped 03/09/17 2240)  morphine 4 MG/ML injection 4 mg (4 mg Intravenous Given 03/09/17 2130)  iopamidol (ISOVUE-300) 61 % injection (75 mLs  Contrast Given 03/09/17 2253)  morphine 4 MG/ML injection 4 mg (4 mg Intravenous Given 03/10/17 0027)  sulfamethoxazole-trimethoprim (BACTRIM DS,SEPTRA DS) 800-160 MG per tablet 1 tablet (1 tablet Oral Given 03/10/17 0027)     Initial Impression / Assessment and Plan / ED Course  I have reviewed the  triage vital signs and the nursing notes.  Pertinent labs & imaging results that were available during my care of the patient were reviewed by me and considered in my medical decision making (see chart for details).     Patient  with ongoing ulcerations/chronic appearing wounds to the underside of bilateral breast.  Presents for evaluation of one episode of loss of consciousness earlier today.  Afebrile, vital signs are stable.  She is uncomfortable but nontoxic in appearance.  Her primary care physician has been managing these wounds for several months several months.  She is awaiting referral to dermatology. She is not hypoglycemic.  No leukocytosis, no anemia.  Lab work is essentially unremarkable.  UA is not concerning for UTI or nephrolithiasis.  She has musculoskeletal back pain secondary to her fall during her syncopal episode but no red flag signs concerning for cauda equina or spinal abscess.  Pain improved after administration of pain medicine.  EKG shows no acute changes from last tracing.  Patient suspects her syncopal episode was secondary to pain and I agree, likely pain or vasovagal response.  She is not on blood thinners, I do not think head imaging is warranted at this time.  Dr. Ellender Hose saw and evaluated patient was able to express a small amount of purulent material from the right breast lesion.  CT scan of the chest with contrast was obtained with no evidence of abscess or drainable fluid collection.  Patient notes that she has been using gain detergent, but her skin lesions improved after stopping this detergent.  Advised patient to discontinue use of gain and discussed wound care.  Nonadherent dressing was applied.  She was complaining of multiple episodes of diarrhea for several months, however has a history of IBS and states this may not be abnormal for her.  She was unable to give [name] a stool sample today to send for testing.  I have a low suspicion of C. difficile. Patient states that she is feeling better after the administration of pain medicine.  She is ambulatory without difficulty.  She is tolerating p.o. food and fluids without difficulty.  She is stable for discharge home with follow-up with wound care and  dermatology.  Will discharge with Bactrim and topical mupirocin cream as well as a small amount of pain medicine.  Discussed indications for return to the ED. Pt verbalized understanding of and agreement with plan and is safe for discharge home at this time.  Patient seen and evaluated by Dr. Ellender Hose who agrees with assessment and plan at this time.  Final Clinical Impressions(s) / ED Diagnoses   Final diagnoses:  Wound of left breast, initial encounter  Wound of right breast, initial encounter  Syncope and collapse    ED Discharge Orders        Ordered    mupirocin cream (BACTROBAN) 2 %  2 times daily     03/10/17 0005    sulfamethoxazole-trimethoprim (BACTRIM DS,SEPTRA DS) 800-160 MG tablet  2 times daily     03/10/17 0005    HYDROcodone-acetaminophen (NORCO/VICODIN) 5-325 MG tablet  Every 6 hours PRN     03/10/17 0005      Renita Papa, PA-C 03/10/17 0124  Duffy Bruce, MD 03/10/17 1158

## 2017-03-09 NOTE — ED Notes (Signed)
Bed: [facility] Expected date:  Expected time:  Means of arrival:  Comments: 42 yo syncope, neck pain, wound infection

## 2017-03-10 LAB — URINALYSIS, ROUTINE W REFLEX MICROSCOPIC
Bacteria, UA: NONE SEEN
Bilirubin Urine: NEGATIVE
Glucose, UA: NEGATIVE mg/dL
Ketones, ur: NEGATIVE mg/dL
Leukocytes, UA: NEGATIVE
Nitrite: NEGATIVE
Protein, ur: NEGATIVE mg/dL
Specific Gravity, Urine: 1.039 — ABNORMAL HIGH (ref 1.005–1.030)
pH: 5 (ref 5.0–8.0)

## 2017-03-10 MED ORDER — SULFAMETHOXAZOLE-TRIMETHOPRIM 800-160 MG PO TABS: 1.0000 | ORAL_TABLET | Freq: Two times a day (BID) | ORAL | 0 refills | Status: AC

## 2017-03-10 MED ORDER — MUPIROCIN CALCIUM 2 % EX CREA: 1.0000 "application " | TOPICAL_CREAM | Freq: Two times a day (BID) | CUTANEOUS | 0 refills | Status: DC

## 2017-03-10 MED ORDER — HYDROCODONE-ACETAMINOPHEN 5-325 MG PO TABS: 1.0000 | ORAL_TABLET | Freq: Four times a day (QID) | ORAL | 0 refills | Status: DC | PRN

## 2017-03-10 NOTE — Discharge Instructions (Signed)
Please take all of your antibiotics until finished!   You may develop abdominal discomfort or diarrhea from the antibiotic.  You may help offset this with probiotics which you can buy or get in yogurt. Do not eat  or take the probiotics until 2 hours after your antibiotic.   Take norco as needed for pain but do not drive, drink alcohol, or operate heavy machinery while on this medication as it may make you drowsy.  You may take 600 mg of ibuprofen every 6 hours as needed for pain additionally.  Follow-up with a dermatologist and a wound care center will be very important.  Keep the area clean and dry.  Apply mupirocin ointment twice daily, then apply a nonadherent dressing and change this at least once daily.  Do not wear bras with underwire's, but you may wear a sports bra to keep the dressing in place.

## 2017-03-17 ENCOUNTER — Ambulatory Visit (HOSPITAL_BASED_OUTPATIENT_CLINIC_OR_DEPARTMENT_OTHER): Payer: Medicaid Other

## 2017-03-18 ENCOUNTER — Telehealth: Payer: Self-pay | Admitting: Internal Medicine

## 2017-03-18 ENCOUNTER — Ambulatory Visit: Payer: Self-pay | Admitting: Internal Medicine

## 2017-03-18 NOTE — Telephone Encounter (Signed)
Pt called to reschedule her appointment and was sad to hear her doctor is changing she would like a call back directly from her doctor to express her gratitude. Please follow up

## 2017-03-31 NOTE — Telephone Encounter (Signed)
Discussed with patient. Please schedule appointment for Feb 20

## 2017-03-31 NOTE — Telephone Encounter (Signed)
Appt has been scheduled.

## 2017-04-01 ENCOUNTER — Encounter (HOSPITAL_BASED_OUTPATIENT_CLINIC_OR_DEPARTMENT_OTHER): Payer: Medicaid Other

## 2017-04-03 ENCOUNTER — Ambulatory Visit: Payer: Self-pay | Admitting: Internal Medicine

## 2017-04-08 ENCOUNTER — Ambulatory Visit: Payer: Medicaid Other | Attending: Internal Medicine | Admitting: Internal Medicine

## 2017-04-08 ENCOUNTER — Encounter: Payer: Self-pay | Admitting: Internal Medicine

## 2017-04-08 ENCOUNTER — Other Ambulatory Visit: Payer: Self-pay

## 2017-04-08 VITALS — BP 121/84 | HR 70 | Temp 98.5°F | Resp 12 | Wt 232.6 lb

## 2017-04-08 DIAGNOSIS — F411 Generalized anxiety disorder: Secondary | ICD-10-CM

## 2017-04-08 DIAGNOSIS — Z7984 Long term (current) use of oral hypoglycemic drugs: Secondary | ICD-10-CM | POA: Diagnosis not present

## 2017-04-08 DIAGNOSIS — G5793 Unspecified mononeuropathy of bilateral lower limbs: Secondary | ICD-10-CM

## 2017-04-08 DIAGNOSIS — K219 Gastro-esophageal reflux disease without esophagitis: Secondary | ICD-10-CM | POA: Diagnosis not present

## 2017-04-08 DIAGNOSIS — G4733 Obstructive sleep apnea (adult) (pediatric): Secondary | ICD-10-CM | POA: Insufficient documentation

## 2017-04-08 DIAGNOSIS — Z886 Allergy status to analgesic agent status: Secondary | ICD-10-CM | POA: Diagnosis not present

## 2017-04-08 DIAGNOSIS — Z91013 Allergy to seafood: Secondary | ICD-10-CM | POA: Insufficient documentation

## 2017-04-08 DIAGNOSIS — M48061 Spinal stenosis, lumbar region without neurogenic claudication: Secondary | ICD-10-CM | POA: Diagnosis not present

## 2017-04-08 DIAGNOSIS — Z880 Allergy status to penicillin: Secondary | ICD-10-CM | POA: Insufficient documentation

## 2017-04-08 DIAGNOSIS — E1140 Type 2 diabetes mellitus with diabetic neuropathy, unspecified: Secondary | ICD-10-CM | POA: Diagnosis not present

## 2017-04-08 DIAGNOSIS — Z79899 Other long term (current) drug therapy: Secondary | ICD-10-CM | POA: Diagnosis not present

## 2017-04-08 DIAGNOSIS — G43509 Persistent migraine aura without cerebral infarction, not intractable, without status migrainosus: Secondary | ICD-10-CM | POA: Diagnosis not present

## 2017-04-08 DIAGNOSIS — E119 Type 2 diabetes mellitus without complications: Secondary | ICD-10-CM

## 2017-04-08 DIAGNOSIS — M792 Neuralgia and neuritis, unspecified: Secondary | ICD-10-CM | POA: Diagnosis not present

## 2017-04-08 LAB — GLUCOSE, POCT (MANUAL RESULT ENTRY): POC Glucose: 110 mg/dl — AB (ref 70–99)

## 2017-04-08 MED ORDER — BUSPIRONE HCL 10 MG PO TABS: 10.0000 mg | ORAL_TABLET | Freq: Two times a day (BID) | ORAL | 3 refills | Status: DC

## 2017-04-08 MED ORDER — ONETOUCH ULTRASOFT LANCETS MISC: 12 refills | Status: DC

## 2017-04-08 MED ORDER — METFORMIN HCL 500 MG PO TABS: 500.0000 mg | ORAL_TABLET | Freq: Two times a day (BID) | ORAL | 3 refills | Status: DC

## 2017-04-08 MED ORDER — SUMATRIPTAN SUCCINATE 25 MG PO TABS: 25.0000 mg | ORAL_TABLET | Freq: Once | ORAL | 0 refills | Status: DC

## 2017-04-08 MED ORDER — GLUCOSE BLOOD VI STRP: ORAL_STRIP | 12 refills | Status: DC

## 2017-04-08 MED ORDER — GABAPENTIN 300 MG PO CAPS: 300.0000 mg | ORAL_CAPSULE | Freq: Three times a day (TID) | ORAL | 3 refills | Status: DC

## 2017-04-08 NOTE — Patient Instructions (Signed)
Migraine Headache A migraine headache is an intense, throbbing pain on one side or both sides of the head. Migraines may also cause other symptoms, such as nausea, vomiting, and sensitivity to light and noise. What are the causes? Doing or taking certain things may also trigger migraines, such as:  Alcohol.  Smoking.  Medicines, such as: ? Medicine used to treat chest pain (nitroglycerine). ? Birth control pills. ? Estrogen pills. ? Certain blood pressure medicines.  Aged cheeses, chocolate, or caffeine.  Foods or drinks that contain nitrates, glutamate, aspartame, or tyramine.  Physical activity.  Other things that may trigger a migraine include:  Menstruation.  Pregnancy.  Hunger.  Stress, lack of sleep, too much sleep, or fatigue.  Weather changes.  What increases the risk? The following factors may make you more likely to experience migraine headaches:  Age. Risk increases with age.  Family history of migraine headaches.  Being Caucasian.  Depression and anxiety.  Obesity.  Being a woman.  Having a hole in the heart (patent foramen ovale) or other heart problems.  What are the signs or symptoms? The main symptom of this condition is pulsating or throbbing pain. Pain may:  Happen in any area of the head, such as on one side or both sides.  Interfere with daily activities.  Get worse with physical activity.  Get worse with exposure to bright lights or loud noises.  Other symptoms may include:  Nausea.  Vomiting.  Dizziness.  General sensitivity to bright lights, loud noises, or smells.  Before you get a migraine, you may get warning signs that a migraine is developing (aura). An aura may include:  Seeing flashing lights or having blind spots.  Seeing bright spots, halos, or zigzag lines.  Having tunnel vision or blurred vision.  Having numbness or a tingling feeling.  Having trouble talking.  Having muscle weakness.  How is this  diagnosed? A migraine headache can be diagnosed based on:  Your symptoms.  A physical exam.  Tests, such as CT scan or MRI of the head. These imaging tests can help rule out other causes of headaches.  Taking fluid from the spine (lumbar puncture) and analyzing it (cerebrospinal fluid analysis, or CSF analysis).  How is this treated? A migraine headache is usually treated with medicines that:  Relieve pain.  Relieve nausea.  Prevent migraines from coming back.  Treatment may also include:  Acupuncture.  Lifestyle changes like avoiding foods that trigger migraines.  Follow these instructions at home: Medicines  Take over-the-counter and prescription medicines only as told by your health care provider.  Do not drive or use heavy machinery while taking prescription pain medicine.  To prevent or treat constipation while you are taking prescription pain medicine, your health care provider may recommend that you: ? Drink enough fluid to keep your urine clear or pale yellow. ? Take over-the-counter or prescription medicines. ? Eat foods that are high in fiber, such as fresh fruits and vegetables, whole grains, and beans. ? Limit foods that are high in fat and processed sugars, such as fried and sweet foods. Lifestyle  Avoid alcohol use.  Do not use any products that contain nicotine or tobacco, such as cigarettes and e-cigarettes. If you need help quitting, ask your health care provider.  Get at least 8 hours of sleep every night.  Limit your stress. General instructions   Keep a journal to find out what may trigger your migraine headaches. For example, write down: ? What you eat and   drink. ? How much sleep you get. ? Any change to your diet or medicines.  If you have a migraine: ? Avoid things that make your symptoms worse, such as bright lights. ? It may help to lie down in a dark, quiet room. ? Do not drive or use heavy machinery. ? Ask your health care provider  what activities are safe for you while you are experiencing symptoms.  Keep all follow-up visits as told by your health care provider. This is important. Contact a health care provider if:  You develop symptoms that are different or more severe than your usual migraine symptoms. Get help right away if:  Your migraine becomes severe.  You have a fever.  You have a stiff neck.  You have vision loss.  Your muscles feel weak or like you cannot control them.  You start to lose your balance often.  You develop trouble walking.  You faint. This information is not intended to replace advice given to you by your health care provider. Make sure you discuss any questions you have with your health care provider. Document Released: 02/03/2005 Document Revised: 08/24/2015 Document Reviewed: 07/23/2015 Elsevier Interactive Patient Education  2017 [employer]. Diabetes and Foot Care Diabetes may cause you to have problems because of poor blood supply (circulation) to your feet and legs. This may cause the skin on your feet to become thinner, break easier, and heal more slowly. Your skin may become dry, and the skin may peel and crack. You may also have nerve damage in your legs and feet causing decreased feeling in them. You may not notice minor injuries to your feet that could lead to infections or more serious problems. Taking care of your feet is one of the most important things you can do for yourself. Follow these instructions at home:  Wear shoes at all times, even in the house. Do not go barefoot. Bare feet are easily injured.  Check your feet daily for blisters, cuts, and redness. If you cannot see the bottom of your feet, use a mirror or ask someone for help.  Wash your feet with warm water (do not use hot water) and mild soap. Then pat your feet and the areas between your toes until they are completely dry. Do not soak your feet as this can dry your skin.  Apply a moisturizing lotion  or petroleum jelly (that does not contain alcohol and is unscented) to the skin on your feet and to dry, brittle toenails. Do not apply lotion between your toes.  Trim your toenails straight across. Do not dig under them or around the cuticle. File the edges of your nails with an emery board or nail file.  Do not cut corns or calluses or try to remove them with medicine.  Wear clean socks or stockings every day. Make sure they are not too tight. Do not wear knee-high stockings since they may decrease blood flow to your legs.  Wear shoes that fit properly and have enough cushioning. To break in new shoes, wear them for just a few hours a day. This prevents you from injuring your feet. Always look in your shoes before you put them on to be sure there are no objects inside.  Do not cross your legs. This may decrease the blood flow to your feet.  If you find a minor scrape, cut, or break in the skin on your feet, keep it and the skin around it clean and dry. These areas may be  cleansed with mild soap and water. Do not cleanse the area with peroxide, alcohol, or iodine.  When you remove an adhesive bandage, be sure not to damage the skin around it.  If you have a wound, look at it several times a day to make sure it is healing.  Do not use heating pads or hot water bottles. They may burn your skin. If you have lost feeling in your feet or legs, you may not know it is happening until it is too late.  Make sure your health care provider performs a complete foot exam at least annually or more often if you have foot problems. Report any cuts, sores, or bruises to your health care provider immediately. Contact a health care provider if:  You have an injury that is not healing.  You have cuts or breaks in the skin.  You have an ingrown nail.  You notice redness on your legs or feet.  You feel burning or tingling in your legs or feet.  You have pain or cramps in your legs and feet.  Your legs  or feet are numb.  Your feet always feel cold. Get help right away if:  There is increasing redness, swelling, or pain in or around a wound.  There is a red line that goes up your leg.  Pus is coming from a wound.  You develop a fever or as directed by your health care provider.  You notice a bad smell coming from an ulcer or wound. This information is not intended to replace advice given to you by your health care provider. Make sure you discuss any questions you have with your health care provider. Document Released: 02/01/2000 Document Revised: 07/12/2015 Document Reviewed: 07/13/2012 Elsevier Interactive Patient Education  2017 [employer].

## 2017-04-08 NOTE — Progress Notes (Signed)
Needs nebulizer ampules for treatments Discuss anxiety Med RF pepcid Headaches and dizziness

## 2017-04-09 LAB — HEMOGLOBIN A1C
Est. average glucose Bld gHb Est-mCnc: 123 mg/dL
Hgb A1c MFr Bld: 5.9 % — ABNORMAL HIGH (ref 4.8–5.6)

## 2017-04-13 ENCOUNTER — Other Ambulatory Visit: Payer: Self-pay | Admitting: Pharmacist

## 2017-04-13 MED ORDER — ACCU-CHEK AVIVA PLUS W/DEVICE KIT: PACK | 0 refills | Status: DC

## 2017-04-13 MED ORDER — GLUCOSE BLOOD VI STRP: ORAL_STRIP | 12 refills | Status: DC

## 2017-04-13 MED ORDER — ACCU-CHEK SOFT TOUCH LANCETS MISC: 12 refills | Status: DC

## 2017-04-17 ENCOUNTER — Other Ambulatory Visit: Payer: Self-pay | Admitting: Pharmacist

## 2017-04-17 MED ORDER — ACCU-CHEK FASTCLIX LANCETS MISC: 2 refills | Status: DC

## 2017-04-23 ENCOUNTER — Other Ambulatory Visit: Payer: Self-pay | Admitting: Pharmacist

## 2017-04-23 MED ORDER — ACCU-CHEK FASTCLIX LANCETS MISC: 5 refills | Status: DC

## 2017-05-03 NOTE — Progress Notes (Signed)
Marisa [name], is a 42 y.o. female  [id number]  [id number]  DOB - 13-Sep-1975  Chief Complaint  Patient presents with  . Follow-up  . Anxiety  . Headache       Subjective:   Marisa [name] is a 42 y.o. female with multiple medical history including type 2 diabetes mellitus on metformin, lumbar stenosis with bilateral lower Limb weakness now ambulating wellwithout restrictions, morbid obesity, major depression and anxiety, chronic migraine headache who presents here today for a follow up visit and medication refills. She has no new complaint today but still having significant anxiety and would like to start "something" strong. She is currently not on any medication for anxiety. She claims adherence with her diabetes medications, she reports no side effect, no hypoglycemic episode. Patient has No headache, No chest pain, No abdominal pain - No Nausea, No new weakness tingling or numbness, No Cough - SOB.  No problems updated.  ALLERGIES: Allergies  Allergen Reactions  . Asa [Aspirin] Anaphylaxis    Hives, chest tightness   . Mushroom Extract Complex Anaphylaxis, Swelling and Other (See Comments)    Reaction:  Eye swelling  . Penicillins Anaphylaxis and Other (See Comments)    Has patient had a PCN reaction causing immediate rash, facial/tongue/throat swelling, SOB or lightheadedness with hypotension: Yes Has patient had a PCN reaction causing severe rash involving mucus membranes or skin necrosis: No Has patient had a PCN reaction that required hospitalization No Has patient had a PCN reaction occurring within the last 10 years: No If all of the above answers are "NO", then may proceed with Cephalosporin use.  . Shellfish Allergy Anaphylaxis  . Triamcinolone Other (See Comments)    Skin issues     PAST MEDICAL HISTORY: Past Medical History:  Diagnosis Date  . Allergy   . Anal pain    chronic  . Anemia   . Anxiety   . Asthma    exacerbation  02-28-2014 and 02-23-2014 secondary to Rhinovirus  . Chronic headaches   . Chronic low back pain   . Cyst of right ovary   . Difficult intravenous access    PER PT NEEDS PICC LINE  . Gait instability   . GERD (gastroesophageal reflux disease)   . History of adenomatous polyp of colon   . History of cardiac arrest    during SVD 1992  . History of ectopic pregnancy    2009-  S/P LEFT SALPINGECTOMY  . History of panic attacks   . IBS (irritable bowel syndrome)   . Lumbar stenosis L4 -- L5 with bulging disk   w/ right leg weakness/ decreased mobility  . Mild obstructive sleep apnea    study 03-20-2014  no cpap recommended  . Neuromuscular disorder (HCC)    neuropathy in feet   . Sleep apnea    mild no cpap  . Type 2 diabetes mellitus ([place])   . Weakness of right leg    FROM BACK PROBLEM PER PT    MEDICATIONS AT HOME: Prior to Admission medications   Medication Sig Start Date End Date Taking? Authorizing Provider  albuterol (PROVENTIL HFA;VENTOLIN HFA) 108 (90 Base) MCG/ACT inhaler Inhale 1-2 puffs into the lungs every 6 (six) hours as needed for wheezing or shortness of breath. 12/17/16  Yes Tresa Garter, MD  gabapentin (NEURONTIN) 300 MG capsule Take 1 capsule (300 mg total) by mouth 3 (three) times daily. 04/08/17  Yes Tresa Garter, MD  ketorolac (ACULAR) 0.5 % ophthalmic solution Place 1  drop into the right eye every 6 (six) hours. 09/04/16  Yes Virgel Manifold, MD  metFORMIN (GLUCOPHAGE) 500 MG tablet Take 1 tablet (500 mg total) by mouth 2 (two) times daily with a meal. 04/08/17  Yes Yamato Kopf E, MD  mometasone-formoterol (DULERA) 100-5 MCG/ACT AERO Inhale 2 puffs into the lungs 2 (two) times daily. Patient taking differently: Inhale 2 puffs into the lungs every other day.  12/17/16  Yes Tresa Garter, MD  SUMAtriptan (IMITREX) 25 MG tablet Take 1 tablet (25 mg total) by mouth once for 1 dose. May repeat in 2 hours if headache persists or recurs.  04/08/17 04/08/17 Yes Tresa Garter, MD  ACCU-CHEK FASTCLIX LANCETS MISC Use as directed once daily. E11.9 04/23/17   Tresa Garter, MD  Blood Glucose Monitoring Suppl (ACCU-CHEK AVIVA PLUS) w/Device KIT Use as directed once daily. E11.9 04/13/17   Tresa Garter, MD  busPIRone (BUSPAR) 10 MG tablet Take 1 tablet (10 mg total) by mouth 2 (two) times daily. 04/08/17   Tresa Garter, MD  ergocalciferol (VITAMIN D2) 50000 units capsule Take 1 capsule (50,000 Units total) by mouth once a week. Patient not taking: Reported on 04/08/2017 12/17/16   Tresa Garter, MD  glucose blood (ACCU-CHEK AVIVA PLUS) test strip Use as instructed once daily. E11.9 04/13/17   Tresa Garter, MD    Objective:   Vitals:   04/08/17 1015  BP: 121/84  Pulse: 70  Resp: 12  Temp: 98.5 F (36.9 C)  TempSrc: Oral  SpO2: 98%  Weight: 105.5 kg (232 lb 9.6 oz)   Exam General appearance : Awake, alert, not in any distress. Speech Clear. Not toxic looking, obese HEENT: Atraumatic and Normocephalic, pupils equally reactive to light and accomodation Neck: Supple, no JVD. No cervical lymphadenopathy.  Chest: Good air entry bilaterally, no added sounds  CVS: S1 S2 regular, no murmurs.  Abdomen: Bowel sounds present, Non tender and not distended with no gaurding, rigidity or rebound. Extremities: B/L Lower Ext shows no edema, both legs are warm to touch Neurology: Awake alert, and oriented X 3, CN II-XII intact, Non focal Skin: No Rash  Data Review Lab Results  Component Value Date   HGBA1C 5.9 (H) 04/08/2017   HGBA1C 6.2 12/17/2016   HGBA1C 7.0 04/16/2016    Assessment & Plan   1. Type 2 diabetes mellitus with diabetic neuropathy, without long-term current use of insulin (HCC)  - Glucose (CBG) - Hemoglobin A1c  Aim for 30 minutes of exercise most days. Rethink what you drink. Water is great! Aim for 2-3 Carb Choices per meal (30-45 grams) +/- 1 either way  Aim for 0-15  Carbs per snack if hungry  Include protein in moderation with your meals and snacks  Consider reading food labels for Total Carbohydrate and Fat Grams of foods  Consider checking BG at alternate times per day  Continue taking medication as directed Be mindful about how much sugar you are adding to beverages and other foods. Fruit Punch - find one with no sugar  Measure and decrease portions of carbohydrate foods  Make your plate and don't go back for seconds  2. 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 May repeat in 2 hours if headache persists or recurs.  Dispense: 10 tablet; Refill: 0  3. Well controlled type 2 diabetes mellitus ([place]) Refill -  metFORMIN (GLUCOPHAGE) 500 MG tablet; Take 1 tablet (500 mg total) by mouth 2 (two) times daily with a meal.  Dispense: 180 tablet; Refill: 3  4. Neuropathic pain of both legs Refill - gabapentin (NEURONTIN) 300 MG capsule; Take 1 capsule (300 mg total) by mouth 3 (three) times daily.  Dispense: 270 capsule; Refill: 3  5. Generalized anxiety disorder Start - busPIRone (BUSPAR) 10 MG tablet; Take 1 tablet (10 mg total) by mouth 2 (two) times daily.  Dispense: 60 tablet; Refill: 3  Patient have been counseled extensively about nutrition and exercise. Other issues discussed during this visit include: low cholesterol diet, weight control and daily exercise, foot care, annual eye examinations at Ophthalmology, importance of adherence with medications and regular follow-up. We also discussed long term complications of uncontrolled diabetes and hypertension.   Return in about 6 months (around 10/06/2017) for Follow up Pain and comorbidities, Hemoglobin A1C and Follow up, DM.  The patient was given clear instructions to go to ER or return to medical center if symptoms don't improve, worsen or new  problems develop. The patient verbalized understanding. The patient was told to call to get lab results if they haven't heard anything in the next week.   This note has been created with Surveyor, quantity. Any transcriptional errors are unintentional.    Angelica Chessman, MD, [place], Karilyn Cota, [place] and [facility] [place], [place]

## 2017-07-10 ENCOUNTER — Ambulatory Visit: Payer: Medicaid Other | Admitting: Podiatry

## 2017-07-10 ENCOUNTER — Encounter: Payer: Self-pay | Admitting: Podiatry

## 2017-07-10 DIAGNOSIS — M722 Plantar fascial fibromatosis: Secondary | ICD-10-CM

## 2017-07-10 MED ORDER — TRIAMCINOLONE ACETONIDE 10 MG/ML IJ SUSP
10.0000 mg | Freq: Once | INTRAMUSCULAR | Status: AC
  Administered 2017-07-10: 10 mg

## 2017-08-19 ENCOUNTER — Ambulatory Visit: Payer: Self-pay | Admitting: Obstetrics & Gynecology

## 2017-08-19 ENCOUNTER — Encounter: Payer: Self-pay | Admitting: *Deleted

## 2017-08-19 NOTE — Progress Notes (Signed)
Marisa [name] did not keep her scheduled appointment for annual exam. Per discussion with Dr. Harolyn Rutherford does not need to be called, may reschedule if she calls.

## 2017-12-16 ENCOUNTER — Encounter: Payer: Self-pay | Admitting: Podiatry

## 2017-12-16 ENCOUNTER — Ambulatory Visit: Payer: Medicare Other | Admitting: Podiatry

## 2017-12-16 DIAGNOSIS — M722 Plantar fascial fibromatosis: Secondary | ICD-10-CM | POA: Diagnosis not present

## 2017-12-16 MED ORDER — TRIAMCINOLONE ACETONIDE 10 MG/ML IJ SUSP
10.0000 mg | Freq: Once | INTRAMUSCULAR | Status: AC
Start: 2017-12-16 — End: 2017-12-16
  Administered 2017-12-16: 10 mg

## 2017-12-17 NOTE — Progress Notes (Signed)
Subjective:   Patient ID: Marisa [name], female   DOB: 42 y.o.   MRN: [id number]   HPI Patient states that heels have become very sore bilateral making it hard to walk   ROS      Objective:  Physical Exam  Neurovascular status intact with exquisite discomfort plantar aspect heel region bilateral     Assessment:  Acute letter fasciitis bilateral     Plan:  Sterile prep to each heel and injected the plantar fascial bilateral 3 mg Kenalog 5 Milgram Xylocaine advised on supportive shoes

## 2017-12-29 ENCOUNTER — Encounter: Payer: Self-pay | Admitting: Family Medicine

## 2017-12-29 ENCOUNTER — Other Ambulatory Visit (HOSPITAL_COMMUNITY)
Admission: RE | Admit: 2017-12-29 | Discharge: 2017-12-29 | Disposition: A | Discharge status: Home / Self Care | Payer: Medicare Other | Source: Ambulatory Visit | Attending: Family Medicine | Admitting: Family Medicine

## 2017-12-29 ENCOUNTER — Ambulatory Visit (HOSPITAL_BASED_OUTPATIENT_CLINIC_OR_DEPARTMENT_OTHER): Payer: Medicare Other | Admitting: Family Medicine

## 2017-12-29 VITALS — BP 115/77 | HR 75 | Temp 98.8°F | Resp 18 | Ht 65.0 in | Wt 215.0 lb

## 2017-12-29 DIAGNOSIS — L0293 Carbuncle, unspecified: Secondary | ICD-10-CM

## 2017-12-29 DIAGNOSIS — E669 Obesity, unspecified: Secondary | ICD-10-CM | POA: Diagnosis not present

## 2017-12-29 DIAGNOSIS — L0292 Furuncle, unspecified: Secondary | ICD-10-CM

## 2017-12-29 DIAGNOSIS — Z79899 Other long term (current) drug therapy: Secondary | ICD-10-CM

## 2017-12-29 DIAGNOSIS — M545 Low back pain, unspecified: Secondary | ICD-10-CM

## 2017-12-29 DIAGNOSIS — Z113 Encounter for screening for infections with a predominantly sexual mode of transmission: Secondary | ICD-10-CM

## 2017-12-29 DIAGNOSIS — E1140 Type 2 diabetes mellitus with diabetic neuropathy, unspecified: Secondary | ICD-10-CM

## 2017-12-29 DIAGNOSIS — G5793 Unspecified mononeuropathy of bilateral lower limbs: Secondary | ICD-10-CM | POA: Diagnosis not present

## 2017-12-29 DIAGNOSIS — M5440 Lumbago with sciatica, unspecified side: Secondary | ICD-10-CM | POA: Diagnosis not present

## 2017-12-29 DIAGNOSIS — R102 Pelvic and perineal pain: Secondary | ICD-10-CM

## 2017-12-29 DIAGNOSIS — R1024 Suprapubic pain: Secondary | ICD-10-CM

## 2017-12-29 DIAGNOSIS — Z6835 Body mass index (BMI) 35.0-35.9, adult: Secondary | ICD-10-CM

## 2017-12-29 DIAGNOSIS — J4520 Mild intermittent asthma, uncomplicated: Secondary | ICD-10-CM

## 2017-12-29 LAB — POCT URINALYSIS DIP (CLINITEK)
Bilirubin, UA: NEGATIVE
Blood, UA: NEGATIVE
Glucose, UA: NEGATIVE mg/dL
Ketones, POC UA: NEGATIVE mg/dL
Leukocytes, UA: NEGATIVE
Nitrite, UA: NEGATIVE
POC,PROTEIN,UA: NEGATIVE
Spec Grav, UA: 1.01
Urobilinogen, UA: 0.2 U/dL
pH, UA: 5.5

## 2017-12-29 LAB — POCT GLYCOSYLATED HEMOGLOBIN (HGB A1C): Hemoglobin A1C: 5.4 % (ref 4.0–5.6)

## 2017-12-29 LAB — GLUCOSE, POCT (MANUAL RESULT ENTRY): POC Glucose: 86 mg/dL (ref 70–99)

## 2017-12-29 MED ORDER — ACCU-CHEK SOFT TOUCH LANCETS MISC: 12 refills | Status: DC

## 2017-12-29 MED ORDER — ALBUTEROL SULFATE HFA 108 (90 BASE) MCG/ACT IN AERS: 1.0000 | INHALATION_SPRAY | Freq: Four times a day (QID) | RESPIRATORY_TRACT | 3 refills | Status: DC | PRN

## 2017-12-29 MED ORDER — GABAPENTIN 300 MG PO CAPS: 300.0000 mg | ORAL_CAPSULE | Freq: Three times a day (TID) | ORAL | 1 refills | Status: DC

## 2017-12-29 MED ORDER — ACCU-CHEK FASTCLIX LANCETS MISC: 1 refills | Status: DC

## 2017-12-29 MED ORDER — GLUCOSE BLOOD VI STRP: ORAL_STRIP | 1 refills | Status: DC

## 2017-12-29 MED ORDER — ACCU-CHEK AVIVA PLUS W/DEVICE KIT: PACK | 0 refills | Status: DC

## 2017-12-29 NOTE — Progress Notes (Signed)
Subjective:    Patient ID: Marisa [name], female    DOB: January 26, 1976, 42 y.o.   MRN: [id number]  HPI       42 year old female who was last seen in the office on 04/08/2017 for multiple medical issues including type 2 diabetes, lumbar stenosis with bilateral lower limb weakness/neuropathy, morbid obesity, major depression and anxiety, chronic migraines and recurrent boils.  Patient had hemoglobin A1c at that time of 5.9 and was on metformin.      Patient reports at today's visit that she has lost weight from 270 down to 215 pounds over 22-month  Through changes in diet and exercise.  Patient with complaint of having loose skin on her upper arms as well as her abdomen.  Patient states that she feels as if she tends to get infections under the loose skin on her abdomen.  Patient is wondering if she is a candidate for skin removal surgery but patient would also like to know if she is a candidate for bariatric surgery as she would like to lose additional weight to help with her diabetes.  Patient reports her blood sugar control has improved with weight loss but patient currently does not have a glucometer and will need a prescription to obtain a new glucometer and testing supplies.  Patient also is out of medications for treatment of her asthma.  Patient states that her asthma is well controlled and she denies any nighttime awakening secondary to shortness of breath, chest pressure/tightness and no cough or wheezing.  Patient reports that she has issues as well with chronic back pain and needs a refill of gabapentin.  Patient has also had some recent urinary frequency.  Patient does have issues with some chronic numbness/tingling in her feet due to her diabetes but states that this has improved.  Patient reports recurrent boils/skin infections and would like to see dermatology for further evaluation and treatment.  Past Medical History:  Diagnosis Date  . Allergy   . Anal pain    chronic  . Anemia    . Anxiety   . Asthma    exacerbation 02-28-2014 and 02-23-2014 secondary to Rhinovirus  . Chronic headaches   . Chronic low back pain   . Cyst of right ovary   . Difficult intravenous access    PER PT NEEDS PICC LINE  . Gait instability   . GERD (gastroesophageal reflux disease)   . History of adenomatous polyp of colon   . History of cardiac arrest    during SVD 1992  . History of ectopic pregnancy    2009-  S/P LEFT SALPINGECTOMY  . History of panic attacks   . IBS (irritable bowel syndrome)   . Lumbar stenosis L4 -- L5 with bulging disk   w/ right leg weakness/ decreased mobility  . Mild obstructive sleep apnea    study 03-20-2014  no cpap recommended  . Neuromuscular disorder (HCC)    neuropathy in feet   . Sleep apnea    mild no cpap  . Type 2 diabetes mellitus ([facility] [place]   . Weakness of right leg    FROM BACK PROBLEM PER PT   Past Surgical History:  Procedure Laterality Date  . ABDOMINAL HYSTERECTOMY    . COLONOSCOPY Left 04/29/2013   Procedure: COLONOSCOPY;  Surgeon: WArta Silence MD;  Location: [facility];  Service: Endoscopy;  Laterality: Left;  . COLONOSCOPY    . COLONOSCOPY WITH PROPOFOL N/A 08/01/2016   Procedure: COLONOSCOPY WITH PROPOFOL;  Surgeon: DWilfrid Lund  L III, MD;  Location: [facility];  Service: Gastroenterology;  Laterality: N/A;  . EVALUATION UNDER ANESTHESIA WITH FISTULECTOMY N/A 04/20/2014   Procedure: EXAM UNDER ANESTHESIA ;  Surgeon: Leighton Ruff, MD;  Location: [facility];  Service: General;  Laterality: N/A;  . FLEXIBLE SIGMOIDOSCOPY N/A 11/09/2013   Procedure: FLEXIBLE SIGMOIDOSCOPY;  Surgeon: Arta Silence, MD;  Location: [facility];  Service: Endoscopy;  Laterality: N/A;  . LAPAROSCOPIC CHOLECYSTECTOMY  2005  . SPHINCTEROTOMY N/A 04/20/2014   Procedure:  LATERAL INTERNAL SPHINCTEROTOMY;  Surgeon: Leighton Ruff, MD;  Location: [facility];  Service: General;  Laterality: N/A;  . TRANSTHORACIC ECHOCARDIOGRAM   12-30-2012   mild LVH/  ef 55-60%  . UNILATERAL SALPINGECTOMY  2009   laparotomy left salpingectomy-- ectopic preg.  [facility] UPPER GASTROINTESTINAL ENDOSCOPY    . VAGINAL HYSTERECTOMY N/A 01/06/2013   Procedure: HYSTERECTOMY VAGINAL;  Surgeon: Osborne Oman, MD;  Location: [facility];  Service: Gynecology;  Laterality: N/A;   Family History  Problem Relation Age of Onset  . Hypertension Mother   . Diabetes Mother   . Allergies Mother   . Heart disease Mother   . Clotting disorder Mother   . Cancer Father   . Hyperlipidemia Father   . Hypertension Father   . Colon cancer Father   . Heart disease Maternal Grandmother   . Breast cancer Maternal Grandmother 16  . Schizophrenia Sister   . Bipolar disorder Sister   . Clotting disorder Sister   . Bipolar disorder Brother   . Kidney disease Brother   . Bipolar disorder Sister   . Pancreatic cancer Maternal Aunt   . Prostate cancer Maternal Uncle   . Liver cancer Maternal Grandfather   . Rectal cancer Maternal Grandfather   . Liver cancer Paternal Grandfather   . Colon polyps Neg Hx   . Esophageal cancer Neg Hx   . Stomach cancer Neg Hx    Social History   Tobacco Use  . Smoking status: Former Smoker    Packs/day: 0.20    Years: 11.00    Pack years: 2.20    Types: Cigarettes    Last attempt to quit: 11/17/2013    Years since quitting: 4.1  . Smokeless tobacco: Never Used  . Tobacco comment: 2 years quit  Substance Use Topics  . Alcohol use: No    Alcohol/week: 0.0 standard drinks  . Drug use: No   Allergies  Allergen Reactions  . Asa [Aspirin] Anaphylaxis    Hives, chest tightness   . Mushroom Extract Complex Anaphylaxis, Swelling and Other (See Comments)    Reaction:  Eye swelling  . Penicillins Anaphylaxis and Other (See Comments)    Has patient had a PCN reaction causing immediate rash, facial/tongue/throat swelling, SOB or lightheadedness with hypotension: Yes Has patient had a PCN reaction causing severe rash  involving mucus membranes or skin necrosis: No Has patient had a PCN reaction that required hospitalization No Has patient had a PCN reaction occurring within the last 10 years: No If all of the above answers are "NO", then may proceed with Cephalosporin use.  . Shellfish Allergy Anaphylaxis  . Triamcinolone Other (See Comments)    Skin issues      Review of Systems  Constitutional: Positive for fatigue. Negative for chills and fever.  HENT: Negative for sore throat and trouble swallowing.   Eyes: Negative for photophobia and visual disturbance.  Respiratory: Negative for cough, shortness of breath and wheezing.   Cardiovascular: Negative for  chest pain, palpitations and leg swelling.  Gastrointestinal: Negative for abdominal pain and nausea.  Endocrine: Negative for polydipsia, polyphagia and polyuria.  Genitourinary: Positive for dysuria and frequency.  Musculoskeletal: Positive for back pain and gait problem.  Skin: Positive for rash and wound.  Neurological: Positive for numbness and headaches (occasional). Negative for dizziness.  Hematological: Negative for adenopathy. Does not bruise/bleed easily.  Psychiatric/Behavioral: Negative for self-injury, sleep disturbance and suicidal ideas. The patient is nervous/anxious.        Objective:   Physical Exam BP 115/77 (BP Location: Left Arm, Patient Position: Sitting, Cuff Size: Large)   Pulse 75   Temp 98.8 F (37.1 C) (Oral)   Resp 18   Ht _0  (1.651 m)   Wt 215 lb (97.5 kg)   LMP 11/27/2012   SpO2 99%   BMI 35.78 kg/m Nurse's notes and vital signs reviewed General-well-nourished, well-developed, obese female in no acute distress Neck-supple, no lymphadenopathy, no thyromegaly no carotid bruit Cardiovascular-regular rate and rhythm Lungs-clear to auscultation bilaterally Abdomen-truncal obesity, patient with suprapubic discomfort on examination, no rebound or guarding Back-patient with left CVA tenderness.  Patient with  lumbosacral discomfort to palpation Extremities-no edema Diabetic foot exam- no active skin breakdown on the feet.  Patient with abnormal monofilament exam with absent monofilament sensation on the heels bilaterally and on the right midfoot right top of foot near the base of the toes and left lateral toes.  Early hammertoe deformities.  Patient with 2+ dorsalis pedis and posterior tibial toe pulses.  Nails are painted but they do not appear to be longer thickened Psych-patient is very animated and has rapid speech at times Skin- patient with a quarter size abraded area on the lateral left breast       Assessment & Plan:  1. Type 2 diabetes mellitus with diabetic neuropathy, without long-term current use of insulin ([place]) Patient reports that her diabetes has been very well controlled with the use of metformin in addition to dietary changes and weight loss.  Patient will have hemoglobin A1c, lipid panel, microalbumin creatinine ratio and patient is encouraged to schedule yearly diabetic eye exam if she has not already done so.  Patient will be notified if changes in medication are needed based on lipid panel results and other labs at today's visit.  Patient was made aware that statin medication as recommended for diabetic patients to help reduce the risk of heart disease associated with diabetes.  Patient will also be referred for bariatric surgery as further weight loss may normalize her blood sugars.  Patient provided with refills of diabetic testing supplies. - Lipid panel - Microalbumin/Creatinine Ratio, Urine - HgB A1c - Glucose (CBG) - Amb Referral to Bariatric Surgery - albuterol (PROVENTIL HFA;VENTOLIN HFA) 108 (90 Base) MCG/ACT inhaler; Inhale 1-2 puffs into the lungs every 6 (six) hours as needed for wheezing or shortness of breath.  Dispense: 1 Inhaler; Refill: 3 - Blood Glucose Monitoring Suppl (ACCU-CHEK AVIVA PLUS) w/Device KIT; Use as directed once daily. E11.9  Dispense: 1 kit;  Refill: 0 - glucose blood (ACCU-CHEK AVIVA PLUS) test strip; Use as instructed once daily. E11.9  Dispense: 100 each; Refill: 1 - Lancets (ACCU-CHEK SOFT TOUCH) lancets; Use as instructed  Dispense: 100 each; Refill: 12 - ACCU-CHEK FASTCLIX LANCETS MISC; Use as directed once daily. E11.9  Dispense: 100 each; Refill: 1  2. Routine screening for STI (sexually transmitted infection) Patient requests screening for sexually transmitted infections and patient will have urine cytology done to look for  gonorrhea/chlamydia, trichomonas and other infections such as yeast and bacterial vaginitis.  Patient will also have blood testing for HIV and syphilis.  Patient will be notified of the results and if any further treatments are needed based on these results. - Urine cytology ancillary only - HIV antibody (with reflex) - RPR  3. Suprapubic discomfort Patient with complaint of suprapubic discomfort and patient will have urinalysis to look for possible urinary tract infection.  Patient also with concern for exposure to STIs and patient will also have urine cytology and will be notified if further treatment is needed if either of these tests are abnormal. - POCT URINALYSIS DIP (CLINITEK)  4. Low back pain with radiation Patient with complaint of chronic issues with low back pain with radiation.  Patient is being provided with refill of Neurontin to help with neuropathic pain.  5. Neuropathic pain of both legs Prescription provided for gabapentin to help with neuropathic pain and will discuss at future follow-up if patient needs increase in dose. - gabapentin (NEURONTIN) 300 MG capsule; Take 1 capsule (300 mg total) by mouth 3 (three) times daily.  Dispense: 270 capsule; Refill: 1  6. Mild intermittent asthma without complication Patient with mild intermittent asthma and refill provided for albuterol inhaler to use as needed.  Patient was offered influenza immunization at today's visit which she declined. -  albuterol (PROVENTIL HFA;VENTOLIN HFA) 108 (90 Base) MCG/ACT inhaler; Inhale 1-2 puffs into the lungs every 6 (six) hours as needed for wheezing or shortness of breath.  Dispense: 1 Inhaler; Refill: 3  7. Obesity (BMI 30-39.9) Patient reports that she has had recent weight loss over the past 12 months through dietary changes and exercise but was still like to lose additional weight and also would like to know if she is a candidate for skin removal surgery.  Patient will be referred for bariatric surgery consultation - Amb Referral to Bariatric Surgery  8. Recurrent boils Patient with the complaint of recurrent boils and patient currently has what appears to be healing boil on the breast.  Patient will be referred to dermatology for further evaluation and treatment.  Also discussed with the patient that she may want to switch on antibacterial soap and to make sure that all skin areas are dried well after bathing/showering. - Ambulatory referral to Dermatology  9. Encounter for long-term (current) use of medications Patient had fingerstick blood sugar and urinalysis at today's visit as well as lipid panel.  Patient will be notified of her results and if any further treatment is needed based on the results.  *Influenza immunization offered but declined by the patient  An After Visit Summary was printed and given to the patient.  Return in about 6 weeks (around 02/09/2018) for DM/other issues.

## 2017-12-30 LAB — URINE CYTOLOGY ANCILLARY ONLY
Chlamydia: NEGATIVE
Neisseria Gonorrhea: NEGATIVE
Trichomonas: NEGATIVE

## 2017-12-30 LAB — MICROALBUMIN / CREATININE URINE RATIO
Creatinine, Urine: 129 mg/dL
Microalb/Creat Ratio: 3 mg/g{creat} (ref 0.0–30.0)
Microalbumin, Urine: 3.9 ug/mL

## 2017-12-30 LAB — LIPID PANEL
Chol/HDL Ratio: 3.9 ratio (ref 0.0–4.4)
Cholesterol, Total: 217 mg/dL — ABNORMAL HIGH (ref 100–199)
HDL: 56 mg/dL
LDL Calculated: 131 mg/dL — ABNORMAL HIGH (ref 0–99)
Triglycerides: 150 mg/dL — ABNORMAL HIGH (ref 0–149)
VLDL Cholesterol Cal: 30 mg/dL (ref 5–40)

## 2017-12-30 LAB — HIV ANTIBODY (ROUTINE TESTING W REFLEX): HIV Screen 4th Generation wRfx: NONREACTIVE

## 2017-12-30 LAB — SYPHILIS: RPR W/REFLEX TO RPR TITER AND TREPONEMAL ANTIBODIES, TRADITIONAL SCREENING AND DIAGNOSIS ALGORITHM: RPR Ser Ql: NONREACTIVE

## 2018-01-01 ENCOUNTER — Telehealth (INDEPENDENT_AMBULATORY_CARE_PROVIDER_SITE_OTHER): Payer: Self-pay

## 2018-01-01 LAB — URINE CYTOLOGY ANCILLARY ONLY
Bacterial vaginitis: POSITIVE — AB
Candida vaginitis: NEGATIVE

## 2018-01-01 NOTE — Telephone Encounter (Signed)
-----   Message from Antony Blackbird, MD sent at 12/31/2017  4:03 PM EST ----- Please notify patient that ancillary testing was negative for chlamydia, gonorrhea and trichomonas.  Patient with negative test for syphilis and HIV.  Patient with a normal urine creatinine microalbumin ratio.  Lipid panel shows cholesterol elevated at 217 with bad cholesterol elevated at 131 with her goal being 70 or less for the LDL.  Would recommend that patient take medication to help lower her cholesterol as she is diabetic

## 2018-01-01 NOTE — Telephone Encounter (Signed)
Patient verified DOB. She is aware that chlamydia, gonorrhea, trichomonas, syphilis and HIV are negative. Patient stated she was also to be tested for Herpes, but CMA doesn't see where it was done. Patient aware that creatinine is normal. She is also aware that lipid shows elevated cholesterol and LDL. Advised patient that LDL goal should be at or below 70. Patient needs cholesterol medication sent to her pharmacy. Nat Christen, CMA

## 2018-01-02 ENCOUNTER — Emergency Department (HOSPITAL_COMMUNITY): Payer: Medicare Other

## 2018-01-02 ENCOUNTER — Emergency Department (HOSPITAL_COMMUNITY)
Admission: EM | Admit: 2018-01-02 | Discharge: 2018-01-03 | Disposition: A | Discharge status: Home / Self Care | Payer: Medicare Other | Attending: Emergency Medicine | Admitting: Emergency Medicine

## 2018-01-02 DIAGNOSIS — S0990XA Unspecified injury of head, initial encounter: Secondary | ICD-10-CM | POA: Insufficient documentation

## 2018-01-02 DIAGNOSIS — Y929 Unspecified place or not applicable: Secondary | ICD-10-CM | POA: Diagnosis not present

## 2018-01-02 DIAGNOSIS — Z79899 Other long term (current) drug therapy: Secondary | ICD-10-CM | POA: Insufficient documentation

## 2018-01-02 DIAGNOSIS — W19XXXA Unspecified fall, initial encounter: Secondary | ICD-10-CM | POA: Insufficient documentation

## 2018-01-02 DIAGNOSIS — G43909 Migraine, unspecified, not intractable, without status migrainosus: Secondary | ICD-10-CM | POA: Diagnosis not present

## 2018-01-02 DIAGNOSIS — R55 Syncope and collapse: Secondary | ICD-10-CM | POA: Diagnosis not present

## 2018-01-02 DIAGNOSIS — Z87891 Personal history of nicotine dependence: Secondary | ICD-10-CM | POA: Insufficient documentation

## 2018-01-02 DIAGNOSIS — Y999 Unspecified external cause status: Secondary | ICD-10-CM | POA: Diagnosis not present

## 2018-01-02 DIAGNOSIS — S060X9A Concussion with loss of consciousness of unspecified duration, initial encounter: Secondary | ICD-10-CM | POA: Diagnosis not present

## 2018-01-02 DIAGNOSIS — Y939 Activity, unspecified: Secondary | ICD-10-CM | POA: Diagnosis not present

## 2018-01-02 DIAGNOSIS — R404 Transient alteration of awareness: Secondary | ICD-10-CM | POA: Diagnosis not present

## 2018-01-02 DIAGNOSIS — Z7984 Long term (current) use of oral hypoglycemic drugs: Secondary | ICD-10-CM | POA: Diagnosis not present

## 2018-01-02 DIAGNOSIS — I252 Old myocardial infarction: Secondary | ICD-10-CM | POA: Diagnosis not present

## 2018-01-02 DIAGNOSIS — E119 Type 2 diabetes mellitus without complications: Secondary | ICD-10-CM | POA: Insufficient documentation

## 2018-01-02 DIAGNOSIS — H538 Other visual disturbances: Secondary | ICD-10-CM | POA: Diagnosis not present

## 2018-01-02 DIAGNOSIS — R52 Pain, unspecified: Secondary | ICD-10-CM | POA: Diagnosis not present

## 2018-01-02 DIAGNOSIS — R0902 Hypoxemia: Secondary | ICD-10-CM | POA: Diagnosis not present

## 2018-01-02 LAB — BASIC METABOLIC PANEL
Anion gap: 11 (ref 5–15)
BUN: 8 mg/dL (ref 6–20)
CO2: 21 mmol/L — ABNORMAL LOW (ref 22–32)
Calcium: 8.2 mg/dL — ABNORMAL LOW (ref 8.9–10.3)
Chloride: 108 mmol/L (ref 98–111)
Creatinine, Ser: 0.64 mg/dL (ref 0.44–1.00)
GFR calc Af Amer: >60 mL/min (ref >60)
GFR calc non Af Amer: >60 mL/min (ref >60)
Glucose, Bld: 92 mg/dL (ref 70–99)
Potassium: 4 mmol/L (ref 3.5–5.1)
Sodium: 140 mmol/L (ref 135–145)

## 2018-01-02 LAB — CBC
HCT: 41.9 % (ref 36.0–46.0)
Hemoglobin: 13.7 g/dL (ref 12.0–15.0)
MCH: 32.3 pg (ref 26.0–34.0)
MCHC: 32.7 g/dL (ref 30.0–36.0)
MCV: 98.8 fL (ref 80.0–100.0)
Platelets: 404 10*3/uL — ABNORMAL HIGH (ref 150–400)
RBC: 4.24 MIL/uL (ref 3.87–5.11)
RDW: 12.7 % (ref 11.5–15.5)
WBC: 8.2 10*3/uL (ref 4.0–10.5)
nRBC: 0 % (ref 0.0–0.2)

## 2018-01-02 MED ORDER — HYDROMORPHONE HCL 1 MG/ML IJ SOLN
0.5000 mg | Freq: Once | INTRAMUSCULAR | Status: AC
  Administered 2018-01-02: 0.5 mg via INTRAVENOUS
  Filled 2018-01-02: qty 1

## 2018-01-02 MED ORDER — ONDANSETRON HCL 4 MG/2ML IJ SOLN
4.0000 mg | Freq: Once | INTRAMUSCULAR | Status: AC
  Administered 2018-01-02: 4 mg via INTRAVENOUS
  Filled 2018-01-02: qty 2

## 2018-01-02 MED ORDER — FLUORESCEIN SODIUM 1 MG OP STRP
1.0000 | ORAL_STRIP | Freq: Once | OPHTHALMIC | Status: AC
  Administered 2018-01-02: 1 via OPHTHALMIC
  Filled 2018-01-02: qty 1

## 2018-01-02 MED ORDER — TETRACAINE HCL 0.5 % OP SOLN
2.0000 [drp] | Freq: Once | OPHTHALMIC | Status: AC
  Administered 2018-01-02: 2 [drp] via OPHTHALMIC
  Filled 2018-01-02: qty 4

## 2018-01-02 MED ORDER — SODIUM CHLORIDE 0.9 % IV SOLN: INTRAVENOUS | Status: DC

## 2018-01-02 MED ORDER — SODIUM CHLORIDE 0.9 % IV BOLUS
1000.0000 mL | Freq: Once | INTRAVENOUS | Status: AC
  Administered 2018-01-02: 1000 mL via INTRAVENOUS

## 2018-01-02 MED ORDER — LORAZEPAM 2 MG/ML IJ SOLN
1.0000 mg | Freq: Once | INTRAMUSCULAR | Status: AC
  Administered 2018-01-02: 1 mg via INTRAVENOUS
  Filled 2018-01-02: qty 1

## 2018-01-02 MED ORDER — ONDANSETRON HCL 4 MG/2ML IJ SOLN
INTRAMUSCULAR | Status: AC
  Filled 2018-01-02: qty 2

## 2018-01-02 MED ORDER — ONDANSETRON HCL 4 MG/2ML IJ SOLN
4.0000 mg | Freq: Once | INTRAMUSCULAR | Status: AC
  Administered 2018-01-02: 4 mg via INTRAVENOUS

## 2018-01-02 NOTE — Discharge Instructions (Signed)
Take the pain medicines as directed.  Take the Zofran as needed for nausea and vomiting.  Today's work-up included head CT and MRI brain without any acute findings.  Evaluation of the right eye with the blurred vision did have some slight increase in ocular pressure to 24.  Recommended to follow-up with your ophthalmologist Dr. Katy Fitch on Monday for further evaluation.  Make an appointment follow-up with your regular doctor for the concussion symptoms.  Rest is much as possible.

## 2018-01-02 NOTE — ED Provider Notes (Signed)
[facility] DEPT Provider Note   CSN: [id number] Arrival date & time: 01/02/18  1635     History   Chief Complaint Chief Complaint  Patient presents with  . Loss of Consciousness    HPI Marisa [name] is a 42 y.o. female.  Patient with an unwitnessed fall states that she struck her right forehead.  Patient brought in by EMS.  Patient has a history of diabetes hypertension depression.  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  Patient was drinking wine today son went to get her some food and when he came back he found her on the ground with a bruise on the right forehead area.  Patient states that she thinks she she will had loss of consciousness.  No other injuries.  Patient not on blood thinners.  Patient with complaint of blurred vision in the right eye headache and tenderness to the right forehead area.  No neck pain.  No leg no arm no hip pain.  No low back pain.     Past Medical History:  Diagnosis Date  . Allergy   . Anal pain    chronic  . Anemia   . Anxiety   . Asthma    exacerbation 02-28-2014 and 02-23-2014 secondary to Rhinovirus  . Chronic headaches   . Chronic low back pain   . Cyst of right ovary   . Difficult intravenous access    PER PT NEEDS PICC LINE  . Gait instability   . GERD (gastroesophageal reflux disease)   . History of adenomatous polyp of colon   . History of cardiac arrest    during SVD 1992  . History of ectopic pregnancy    2009-  S/P LEFT SALPINGECTOMY  . History of panic attacks   . IBS (irritable bowel syndrome)   . Lumbar stenosis L4 -- L5 with bulging disk   w/ right leg weakness/ decreased mobility  . Mild obstructive sleep apnea    study 03-20-2014  no cpap recommended  . Neuromuscular disorder (HCC)    neuropathy in feet   . Sleep apnea    mild no cpap  . Type 2 diabetes mellitus ([place])   . Weakness of right leg    FROM BACK PROBLEM PER PT     Patient Active Problem List   Diagnosis Date Noted  . RLQ abdominal pain   . Chronic diarrhea   . Benign neoplasm of transverse colon   . Benign neoplasm of sigmoid colon   . Neuropathic pain of both legs 06/04/2016  . Carbuncle of labium 07/12/2015  . Rash and nonspecific skin eruption 07/12/2015  . Dandruff 03/26/2015  . Nipple discharge in female 03/26/2015  . Migraine variant with headache 05/09/2014  . Unable to ambulate 05/09/2014  . Severe recurrent major depressive disorder with psychotic features ([place]) 05/04/2014  . GAD (generalized anxiety disorder) 05/04/2014  . 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  . Social anxiety disorder 05/04/2014  . PTSD (post-traumatic stress disorder) 05/04/2014  . Cigarette nicotine dependence without complication 78/29/5621  . Gait disturbance 04/11/2014  . Depression 03/27/2014  . Falls 03/27/2014  . OSA (obstructive sleep apnea) 03/06/2014  . Insomnia 03/06/2014  . Anxiety   . History of cardiac arrest   . Acute bronchitis   . Asthma exacerbation 02/20/2014  . Well controlled type 2 diabetes mellitus ([place]) 02/20/2014  . Sore throat 02/20/2014  . Chest pain 02/20/2014  . Tachycardia 02/20/2014  .  Diabetes mellitus without complication ([place]) 48/54/6270  . Pelvic pain 07/25/2013  . Rectal bleeding 07/25/2013  . Persistent vomiting 04/27/2013  . Atypical chest pain 04/26/2013  . Abdominal pain 04/26/2013  . S/P Total vaginal hysterectomy on 01/06/13 01/06/2013  . Dyspnea 09/07/2012  . 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  . Anemia 07/30/2012  . Current smoker 07/30/2012    Past Surgical History:  Procedure Laterality Date  . ABDOMINAL HYSTERECTOMY    . COLONOSCOPY Left 04/29/2013   Procedure: COLONOSCOPY;  Surgeon: Arta Silence, MD;  Location: [facility];  Service: Endoscopy;  Laterality: Left;  . COLONOSCOPY    . COLONOSCOPY WITH PROPOFOL N/A 08/01/2016   Procedure: COLONOSCOPY WITH PROPOFOL;  Surgeon: Doran Stabler,  MD;  Location: [facility];  Service: Gastroenterology;  Laterality: N/A;  . EVALUATION UNDER ANESTHESIA WITH FISTULECTOMY N/A 04/20/2014   Procedure: EXAM UNDER ANESTHESIA ;  Surgeon: Leighton Ruff, MD;  Location: [facility];  Service: General;  Laterality: N/A;  . FLEXIBLE SIGMOIDOSCOPY N/A 11/09/2013   Procedure: FLEXIBLE SIGMOIDOSCOPY;  Surgeon: Arta Silence, MD;  Location: [facility];  Service: Endoscopy;  Laterality: N/A;  . LAPAROSCOPIC CHOLECYSTECTOMY  2005  . SPHINCTEROTOMY N/A 04/20/2014   Procedure:  LATERAL INTERNAL SPHINCTEROTOMY;  Surgeon: Leighton Ruff, MD;  Location: [facility];  Service: General;  Laterality: N/A;  . TRANSTHORACIC ECHOCARDIOGRAM  12-30-2012   mild LVH/  ef 55-60%  . UNILATERAL SALPINGECTOMY  2009   laparotomy left salpingectomy-- ectopic preg.  [facility] UPPER GASTROINTESTINAL ENDOSCOPY    . VAGINAL HYSTERECTOMY N/A 01/06/2013   Procedure: HYSTERECTOMY VAGINAL;  Surgeon: Osborne Oman, MD;  Location: [facility];  Service: Gynecology;  Laterality: N/A;     OB History    Gravida  3   Para  1   Term  1   Preterm      AB  2   Living  1     SAB      TAB  1   Ectopic  1   Multiple      Live Births               Home Medications    Prior to Admission medications   Medication Sig Start Date End Date Taking? Authorizing Provider  ACCU-CHEK FASTCLIX LANCETS MISC Use as directed once daily. E11.9 12/29/17   Fulp, Cammie, MD  albuterol (PROVENTIL HFA;VENTOLIN HFA) 108 (90 Base) MCG/ACT inhaler Inhale 1-2 puffs into the lungs every 6 (six) hours as needed for wheezing or shortness of breath. 12/29/17   Fulp, Cammie, MD  Blood Glucose Monitoring Suppl (ACCU-CHEK AVIVA PLUS) w/Device KIT Use as directed once daily. E11.9 12/29/17   Fulp, Cammie, MD  busPIRone (BUSPAR) 10 MG tablet Take 1 tablet (10 mg total) by mouth 2 (two) times daily. 04/08/17   Tresa Garter, MD  clindamycin (CLEOCIN) 300 MG capsule  06/16/17    [provider]  ergocalciferol (VITAMIN D2) 50000 units capsule Take 1 capsule (50,000 Units total) by mouth once a week. 12/17/16   Tresa Garter, MD  gabapentin (NEURONTIN) 300 MG capsule Take 1 capsule (300 mg total) by mouth 3 (three) times daily. 12/29/17   Fulp, Cammie, MD  glucose blood (ACCU-CHEK AVIVA PLUS) test strip Use as instructed once daily. E11.9 12/29/17   Fulp, Cammie, MD  ketorolac (ACULAR) 0.5 % ophthalmic solution Place 1 drop into the right eye every 6 (six) hours. 09/04/16   Virgel Manifold, MD  Lancets (ACCU-CHEK SOFT [facility]) lancets  Use as instructed 12/29/17   Fulp, Cammie, MD  metFORMIN (GLUCOPHAGE) 500 MG tablet Take 1 tablet (500 mg total) by mouth 2 (two) times daily with a meal. 04/08/17   Jegede, Olugbemiga E, MD  mometasone-formoterol (DULERA) 100-5 MCG/ACT AERO Inhale 2 puffs into the lungs 2 (two) times daily. Patient taking differently: Inhale 2 puffs into the lungs every other day.  12/17/16   Tresa Garter, MD  rifampin (RIFADIN) 300 MG capsule  06/16/17   [provider]  SUMAtriptan (IMITREX) 25 MG tablet Take 1 tablet (25 mg total) by mouth once for 1 dose. May repeat in 2 hours if headache persists or recurs. 04/08/17 04/08/17  Tresa Garter, MD    Family History Family History  Problem Relation Age of Onset  . Hypertension Mother   . Diabetes Mother   . Allergies Mother   . Heart disease Mother   . Clotting disorder Mother   . Cancer Father   . Hyperlipidemia Father   . Hypertension Father   . Colon cancer Father   . Heart disease Maternal Grandmother   . Breast cancer Maternal Grandmother 66  . Schizophrenia Sister   . Bipolar disorder Sister   . Clotting disorder Sister   . Bipolar disorder Brother   . Kidney disease Brother   . Bipolar disorder Sister   . Pancreatic cancer Maternal Aunt   . Prostate cancer Maternal Uncle   . Liver cancer Maternal Grandfather   . Rectal cancer Maternal Grandfather   .  Liver cancer Paternal Grandfather   . Colon polyps Neg Hx   . Esophageal cancer Neg Hx   . Stomach cancer Neg Hx     Social History Social History   Tobacco Use  . Smoking status: Former Smoker    Packs/day: 0.20    Years: 11.00    Pack years: 2.20    Types: Cigarettes    Last attempt to quit: 11/17/2013    Years since quitting: 4.1  . Smokeless tobacco: Never Used  . Tobacco comment: 2 years quit  Substance Use Topics  . Alcohol use: No    Alcohol/week: 0.0 standard drinks  . Drug use: No     Allergies   Asa [aspirin]; Mushroom extract complex; Penicillins; Shellfish allergy; and Triamcinolone   Review of Systems Review of Systems  Constitutional: Negative for fever.  HENT: Negative for congestion.   Eyes: Positive for photophobia and visual disturbance. Negative for pain and redness.  Respiratory: Negative for shortness of breath.   Cardiovascular: Negative for chest pain.  Gastrointestinal: Negative for abdominal pain.  Genitourinary: Negative for dysuria.  Musculoskeletal: Negative for back pain and neck pain.  Skin: Negative for wound.  Neurological: Positive for tremors and headaches. Negative for seizures, facial asymmetry, speech difficulty, weakness and numbness.  Hematological: Does not bruise/bleed easily.  Psychiatric/Behavioral: Negative for confusion.     Physical Exam Updated Vital Signs BP 121/80 (BP Location: Left Arm)   Pulse 94   Temp 98 F (36.7 C) (Oral)   Resp 13   LMP 11/27/2012   SpO2 96%   Physical Exam  Constitutional: She is oriented to person, place, and time. She appears well-developed and well-nourished. No distress.  HENT:  Mouth/Throat: Oropharynx is clear and moist.  Tenderness and slight swelling and bruising right forehead area.  No swelling to the upper or lower eyelid.  No significant abrasion or laceration.  Eyes: Pupils are equal, round, and reactive to light. Conjunctivae and EOM are normal.  No evidence of hyphema.   Floor seen staining of the right eye without evidence of corneal abrasion.  Extraocular muscles intact.  Patient's vision with glasses on 20/20 in the left eye 20/40 right eye.  Ocular pressures right eye was 24 left thigh was 20.  Neck: Normal range of motion. Neck supple.  No no tenderness to posterior palpation to the cervical spine.  Good range motion.  Cardiovascular: Normal rate, regular rhythm and normal heart sounds.  Pulmonary/Chest: Effort normal and breath sounds normal. No respiratory distress.  Abdominal: Soft. Bowel sounds are normal. There is no tenderness.  Musculoskeletal: Normal range of motion. She exhibits no tenderness or deformity.  Neurological: She is alert and oriented to person, place, and time. A cranial nerve deficit is present. No sensory deficit. She exhibits normal muscle tone.  No significant focal deficit other than some blurred vision in the right eye.  Also patient seemed to have a bit of a tremor to her head and to her right hand.  But it seemed to be more of an intention tremor.  Patient given a task it goes away.  Apparently this is new.  Skin: Skin is warm. Capillary refill takes less than 2 seconds.  Nursing note and vitals reviewed.    ED Treatments / Results  Labs (all labs ordered are listed, but only abnormal results are displayed) Labs Reviewed  CBC - Abnormal; Notable for the following components:      Result Value   Platelets 404 (*)    All other components within normal limits  BASIC METABOLIC PANEL - Abnormal; Notable for the following components:   CO2 21 (*)    Calcium 8.2 (*)    All other components within normal limits    EKG None  Radiology Ct Head Wo Contrast  Result Date: 01/02/2018 CLINICAL DATA:  Unwitnessed fall. EXAM: CT HEAD WITHOUT CONTRAST TECHNIQUE: Contiguous axial images were obtained from the base of the skull through the vertex without intravenous contrast. COMPARISON:  12/23/2015 FINDINGS: Brain: No acute  intracranial abnormality. Specifically, no hemorrhage, hydrocephalus, mass lesion, acute infarction, or significant intracranial injury. Vascular: No hyperdense vessel or unexpected calcification. Skull: No acute calvarial abnormality. Sinuses/Orbits: Visualized paranasal sinuses and mastoids clear. Orbital soft tissues unremarkable. Other: None IMPRESSION: Normal study. Electronically Signed   By: Rolm Baptise M.D.   On: 01/02/2018 18:46   Mr Brain Wo Contrast (neuro Protocol)  Result Date: 01/02/2018 CLINICAL DATA:  Fall with head trauma. EXAM: MRI HEAD WITHOUT CONTRAST TECHNIQUE: Multiplanar, multiecho pulse sequences of the brain and surrounding structures were obtained without intravenous contrast. COMPARISON:  Head CT 01/02/2018 FINDINGS: BRAIN: There is no acute infarct, acute hemorrhage, hydrocephalus or extra-axial collection. The midline structures are normal. No midline shift or other mass effect. There are no old infarcts. The white matter signal is normal for the patient's age. The cerebral and cerebellar volume are age-appropriate. Susceptibility-sensitive sequences show no chronic microhemorrhage or superficial siderosis. VASCULAR: Major intracranial arterial and venous sinus flow voids are normal. SKULL AND UPPER CERVICAL SPINE: Calvarial bone marrow signal is normal. There is no skull base mass. Visualized upper cervical spine and soft tissues are normal. SINUSES/ORBITS: No fluid levels or advanced mucosal thickening. No mastoid or middle ear effusion. The orbits are normal. IMPRESSION: Normal brain MRI. Electronically Signed   By: Ulyses Jarred M.D.   On: 01/02/2018 22:51    Procedures Procedures (including critical care time)  Medications Ordered in ED Medications  0.9 %  sodium chloride  infusion (has no administration in time range)  sodium chloride 0.9 % bolus 1,000 mL (0 mLs Intravenous Stopped 01/02/18 2244)  ondansetron (ZOFRAN) injection 4 mg (4 mg Intravenous Given 01/02/18  1813)  HYDROmorphone (DILAUDID) injection 0.5 mg (0.5 mg Intravenous Given 01/02/18 1813)  HYDROmorphone (DILAUDID) injection 0.5 mg (0.5 mg Intravenous Given 01/02/18 1959)  LORazepam (ATIVAN) injection 1 mg (1 mg Intravenous Given 01/02/18 2151)  fluorescein ophthalmic strip 1 strip (1 strip Right Eye Given 01/02/18 2113)  tetracaine (PONTOCAINE) 0.5 % ophthalmic solution 2 drop (2 drops Both Eyes Given 01/02/18 2113)  ondansetron (ZOFRAN) injection 4 mg (4 mg Intravenous Given 01/02/18 2151)     Initial Impression / Assessment and Plan / ED Course  I have reviewed the triage vital signs and the nursing notes.  Pertinent labs & imaging results that were available during my care of the patient were reviewed by me and considered in my medical decision making (see chart for details).     Patient symptoms initially concerning for a concussion.  Head CT was negative.  Work-up of the right eye without evidence of corneal abrasion did have slight increase intraocular pressure at 24.  No evidence of hyphema.  Extraocular muscles intact.  Vision worse in the right eye compared to left with glasses.  Patient is a contact wearer but did not have them on now or during the time of the injury.  Vision in the right eye is 20/40.  Based on the symptom complex and the fact that she seemed to have a little bit of tremor on neuro exam.  Seem to be more of an intention tremor.  Patient did have MRI brain which was negative.  Discussed with ophthalmology on-call.  We determined the patient is followed [facility] they recommend follow-up on Monday by them.  Even if patient is feeling better.  Other patient symptoms consistent with a head injury and concussion.  She will follow-up with her regular doctor for that.  Final Clinical Impressions(s) / ED Diagnoses   Final diagnoses:  Injury of head, initial encounter  Vision blurred  Concussion with loss of consciousness, initial encounter    ED  Discharge Orders    None       Fredia Sorrow, MD 01/03/18 [phone]

## 2018-01-02 NOTE — ED Notes (Signed)
Bed: FO25 Expected date:  Expected time:  Means of arrival:  Comments: ETOH ? Syncopal episode

## 2018-01-02 NOTE — ED Triage Notes (Signed)
Pt BIB [facility] from home. Hx DM, HTN, depression. Pt has been drinking wine today. Son went to get her some food and when he came back he found her on the ground with a bruise on her right forehead. Pt endorces LOC. Pt not on blood thinners. Pt has not slept in 5 days. 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 Pt has been neglecting herself recently due to care giving duties.

## 2018-01-03 ENCOUNTER — Other Ambulatory Visit: Payer: Self-pay | Admitting: Family Medicine

## 2018-01-03 DIAGNOSIS — N760 Acute vaginitis: Principal | ICD-10-CM

## 2018-01-03 DIAGNOSIS — B9689 Other specified bacterial agents as the cause of diseases classified elsewhere: Secondary | ICD-10-CM

## 2018-01-03 MED ORDER — METRONIDAZOLE 500 MG PO TABS: 500.0000 mg | ORAL_TABLET | Freq: Two times a day (BID) | ORAL | 0 refills | Status: DC

## 2018-01-03 MED ORDER — HYDROCODONE-ACETAMINOPHEN 5-325 MG PO TABS: 1.0000 | ORAL_TABLET | Freq: Four times a day (QID) | ORAL | 0 refills | Status: DC | PRN

## 2018-01-03 MED ORDER — ONDANSETRON 4 MG PO TBDP: 4.0000 mg | ORAL_TABLET | Freq: Three times a day (TID) | ORAL | 1 refills | Status: DC | PRN

## 2018-01-03 NOTE — Progress Notes (Signed)
Patient ID: Marisa [name], female   DOB: March 25, 1975, 42 y.o.   MRN: [id number]   Patient with urine cytology test that was positive for the presence of bacteria that causes bacterial vaginitis.  Patient will be notified of the results by CMA/nurse and prescription will be sent to patient's pharmacy for metronidazole in treatment.

## 2018-01-05 ENCOUNTER — Other Ambulatory Visit: Payer: Self-pay | Admitting: Family Medicine

## 2018-01-05 DIAGNOSIS — E785 Hyperlipidemia, unspecified: Secondary | ICD-10-CM

## 2018-01-05 DIAGNOSIS — Z113 Encounter for screening for infections with a predominantly sexual mode of transmission: Secondary | ICD-10-CM

## 2018-01-05 DIAGNOSIS — Z79899 Other long term (current) drug therapy: Secondary | ICD-10-CM

## 2018-01-05 MED ORDER — ATORVASTATIN CALCIUM 20 MG PO TABS: 20.0000 mg | ORAL_TABLET | Freq: Every day | ORAL | 3 refills | Status: DC

## 2018-01-05 NOTE — Progress Notes (Signed)
Patient ID: Marisa [name], female   DOB: 1975-07-02, 42 y.o.   MRN: [id number]    See patient phone call.  Patient wants to have cholesterol medication sent into her pharmacy.  Also patient requests to have testing for HSV.  Prescription sent to patient's pharmacy for Lipitor 20 mg.  Patient should come in in approximately 4 weeks the 6 weeks after starting the medication to have her cholesterol rechecked.  Order also placed for patient to have testing for HSV.

## 2018-01-05 NOTE — Telephone Encounter (Signed)
Please notify patient that lab order was placed for her to have testing for HSV at her convenience.  Prescription has been sent to her pharmacy for atorvastatin 20 mg daily.  Please have patient return in 4 to 6 weeks after starting atorvastatin to have recheck of her liver function test.  Future lab order has been placed

## 2018-01-07 ENCOUNTER — Other Ambulatory Visit: Payer: Self-pay | Admitting: *Deleted

## 2018-01-07 DIAGNOSIS — N760 Acute vaginitis: Principal | ICD-10-CM

## 2018-01-07 DIAGNOSIS — B9689 Other specified bacterial agents as the cause of diseases classified elsewhere: Secondary | ICD-10-CM

## 2018-01-07 MED ORDER — METRONIDAZOLE 500 MG PO TABS: 500.0000 mg | ORAL_TABLET | Freq: Two times a day (BID) | ORAL | 0 refills | Status: AC

## 2018-01-11 ENCOUNTER — Telehealth (INDEPENDENT_AMBULATORY_CARE_PROVIDER_SITE_OTHER): Payer: Self-pay | Admitting: *Deleted

## 2018-01-11 NOTE — Telephone Encounter (Signed)
MA informed patient via voice message of needing to schedule to lab visits, one per her request and the other is a recheck of liver function after starting the atorvastatin in 6 weeks.

## 2018-01-11 NOTE — Telephone Encounter (Signed)
Patient verified DOB Patient is aware of needing to start atorvastatin for elevated cholesterol. Patient states she will take and follow up at her visit on 02/12/18 and have the liver recheck that day as well. Patient was provided the number for the [facility] bariatric. No further questions.

## 2018-01-20 ENCOUNTER — Other Ambulatory Visit: Payer: Self-pay

## 2018-02-08 ENCOUNTER — Telehealth: Payer: Self-pay | Admitting: Family Medicine

## 2018-02-08 ENCOUNTER — Other Ambulatory Visit: Payer: Self-pay | Admitting: Family Medicine

## 2018-02-08 DIAGNOSIS — E669 Obesity, unspecified: Secondary | ICD-10-CM

## 2018-02-08 NOTE — Telephone Encounter (Signed)
Patient called because she was contacted by the bariatric clinic that the referral was placed to see a nutritionist instead of the bariatric surgery and says that the referral needs to be resent. Please follow up.

## 2018-02-08 NOTE — Telephone Encounter (Signed)
Please resend to address this concern.

## 2018-02-08 NOTE — Progress Notes (Signed)
Patient ID: Marisa [name], female   DOB: Oct 01, 1975, 42 y.o.   MRN: [id number]   Patient left a telephone message stating that she had been contacted to see a nutritionist and not a bariatric surgeon and would like to have referral placed again for her to see a bariatric surgeon.

## 2018-02-08 NOTE — Telephone Encounter (Signed)
Another worker referral was placed for patient to see the bariatric surgeon.  Patient should call the nutritionist office to see if the nutrition referral is necessary prior to being allowed to schedule a bariatric surgery appointment

## 2018-02-10 ENCOUNTER — Other Ambulatory Visit: Payer: Self-pay | Admitting: Family Medicine

## 2018-02-10 DIAGNOSIS — E669 Obesity, unspecified: Secondary | ICD-10-CM

## 2018-02-10 NOTE — Progress Notes (Signed)
Patient ID: Marisa [name], female   DOB: 10/05/1975, 42 y.o.   MRN: [id number]   Patient recently requested a referral to a bariatric surgeon and when referral coordinator contacted patient, patient actually wanted to see someone regarding skin removal surgery following weight loss. Referral will be placed for plastic surgery consult and patient will be notified of the appointment.

## 2018-02-12 ENCOUNTER — Ambulatory Visit: Payer: Self-pay | Admitting: Family Medicine

## 2018-02-26 DIAGNOSIS — L732 Hidradenitis suppurativa: Secondary | ICD-10-CM | POA: Diagnosis not present

## 2018-03-02 DIAGNOSIS — E669 Obesity, unspecified: Secondary | ICD-10-CM | POA: Diagnosis not present

## 2018-03-02 DIAGNOSIS — M793 Panniculitis, unspecified: Secondary | ICD-10-CM | POA: Diagnosis not present

## 2018-03-05 DIAGNOSIS — E669 Obesity, unspecified: Secondary | ICD-10-CM | POA: Insufficient documentation

## 2018-03-05 DIAGNOSIS — M793 Panniculitis, unspecified: Secondary | ICD-10-CM | POA: Insufficient documentation

## 2018-03-05 HISTORY — DX: Panniculitis, unspecified: M79.3

## 2018-03-05 HISTORY — DX: Obesity, unspecified: E66.9

## 2018-03-17 DIAGNOSIS — M793 Panniculitis, unspecified: Secondary | ICD-10-CM | POA: Diagnosis not present

## 2018-03-17 DIAGNOSIS — G8918 Other acute postprocedural pain: Secondary | ICD-10-CM | POA: Diagnosis not present

## 2018-03-17 DIAGNOSIS — E65 Localized adiposity: Secondary | ICD-10-CM | POA: Diagnosis not present

## 2018-03-21 DIAGNOSIS — K5903 Drug induced constipation: Secondary | ICD-10-CM | POA: Diagnosis not present

## 2018-03-21 DIAGNOSIS — G8918 Other acute postprocedural pain: Secondary | ICD-10-CM | POA: Diagnosis not present

## 2018-05-03 ENCOUNTER — Other Ambulatory Visit: Payer: Self-pay | Admitting: Family Medicine

## 2018-05-03 DIAGNOSIS — Z1231 Encounter for screening mammogram for malignant neoplasm of breast: Secondary | ICD-10-CM

## 2018-06-21 ENCOUNTER — Encounter: Payer: Medicare Other | Admitting: Podiatry

## 2018-06-21 ENCOUNTER — Ambulatory Visit: Payer: Medicare Other

## 2018-06-21 DIAGNOSIS — M722 Plantar fascial fibromatosis: Secondary | ICD-10-CM

## 2018-06-21 NOTE — Progress Notes (Signed)
This encounter was created in error - please disregard.

## 2018-06-22 ENCOUNTER — Ambulatory Visit: Payer: Self-pay

## 2018-06-24 ENCOUNTER — Encounter: Payer: Self-pay | Admitting: Podiatry

## 2018-06-24 ENCOUNTER — Ambulatory Visit (INDEPENDENT_AMBULATORY_CARE_PROVIDER_SITE_OTHER): Payer: Medicare Other | Admitting: Podiatry

## 2018-06-24 ENCOUNTER — Other Ambulatory Visit: Payer: Self-pay | Admitting: Podiatry

## 2018-06-24 ENCOUNTER — Ambulatory Visit (INDEPENDENT_AMBULATORY_CARE_PROVIDER_SITE_OTHER): Payer: Medicare Other

## 2018-06-24 ENCOUNTER — Other Ambulatory Visit: Payer: Self-pay

## 2018-06-24 VITALS — Temp 98.2°F

## 2018-06-24 DIAGNOSIS — M722 Plantar fascial fibromatosis: Secondary | ICD-10-CM | POA: Diagnosis not present

## 2018-06-24 DIAGNOSIS — M79672 Pain in left foot: Principal | ICD-10-CM

## 2018-06-24 DIAGNOSIS — M79671 Pain in right foot: Secondary | ICD-10-CM

## 2018-06-24 MED ORDER — TRIAMCINOLONE ACETONIDE 10 MG/ML IJ SUSP
10.0000 mg | Freq: Once | INTRAMUSCULAR | Status: AC
  Administered 2018-06-24: 10 mg

## 2018-06-25 NOTE — Progress Notes (Signed)
Subjective:   Patient ID: Marisa Marisa [name], female   DOB: 43 y.o.   MRN: [id number]   HPI Patient states she is developed quite a bit of discomfort in the plantar heel region bilateral alarmed she has worse pain when she gets up in the morning after sitting and did have a number of months of relief after the Marisa [name] visit   ROS      Objective:  Physical Exam  Neurovascular status intact with acute discomfort plantar fascial bilateral with inflammation fluid of the medial band bilateral with tight fascial bands noted     Assessment:  Chronic plantar fasciitis bilateral with acute flareup of each heel     Plan:  H&P reviewed condition sterile prep to each heel I did a injection of the medial fascial band 3 mg Kenalog 5 g Xylocaine bilateral.  I then dispensed night splint with all instructions on usage and patient will be seen back for [name] to recheck again on an as-needed basis and hopefully we can keep these under good control for her with ultimate shockwave or surgery if symptoms do not improve

## 2018-07-19 ENCOUNTER — Other Ambulatory Visit: Payer: Self-pay | Admitting: Internal Medicine

## 2018-07-19 DIAGNOSIS — E119 Type 2 diabetes mellitus without complications: Secondary | ICD-10-CM

## 2018-07-28 ENCOUNTER — Emergency Department (HOSPITAL_COMMUNITY)
Admission: EM | Admit: 2018-07-28 | Discharge: 2018-07-28 | Disposition: A | Discharge status: Home / Self Care | Payer: Medicare Other | Attending: Emergency Medicine | Admitting: Emergency Medicine

## 2018-07-28 ENCOUNTER — Emergency Department (HOSPITAL_COMMUNITY): Payer: Medicare Other

## 2018-07-28 ENCOUNTER — Other Ambulatory Visit: Payer: Self-pay

## 2018-07-28 DIAGNOSIS — Z880 Allergy status to penicillin: Secondary | ICD-10-CM | POA: Diagnosis not present

## 2018-07-28 DIAGNOSIS — R0602 Shortness of breath: Secondary | ICD-10-CM | POA: Diagnosis not present

## 2018-07-28 DIAGNOSIS — J45909 Unspecified asthma, uncomplicated: Secondary | ICD-10-CM | POA: Insufficient documentation

## 2018-07-28 DIAGNOSIS — R112 Nausea with vomiting, unspecified: Secondary | ICD-10-CM | POA: Insufficient documentation

## 2018-07-28 DIAGNOSIS — R42 Dizziness and giddiness: Secondary | ICD-10-CM | POA: Diagnosis not present

## 2018-07-28 DIAGNOSIS — Z20828 Contact with and (suspected) exposure to other viral communicable diseases: Secondary | ICD-10-CM | POA: Diagnosis not present

## 2018-07-28 DIAGNOSIS — Z87891 Personal history of nicotine dependence: Secondary | ICD-10-CM | POA: Insufficient documentation

## 2018-07-28 DIAGNOSIS — R5383 Other fatigue: Secondary | ICD-10-CM | POA: Diagnosis not present

## 2018-07-28 DIAGNOSIS — R079 Chest pain, unspecified: Secondary | ICD-10-CM | POA: Diagnosis not present

## 2018-07-28 DIAGNOSIS — R52 Pain, unspecified: Secondary | ICD-10-CM | POA: Diagnosis not present

## 2018-07-28 DIAGNOSIS — R0902 Hypoxemia: Secondary | ICD-10-CM | POA: Diagnosis not present

## 2018-07-28 DIAGNOSIS — R1013 Epigastric pain: Secondary | ICD-10-CM | POA: Insufficient documentation

## 2018-07-28 DIAGNOSIS — Z79899 Other long term (current) drug therapy: Secondary | ICD-10-CM | POA: Insufficient documentation

## 2018-07-28 DIAGNOSIS — E119 Type 2 diabetes mellitus without complications: Secondary | ICD-10-CM | POA: Insufficient documentation

## 2018-07-28 DIAGNOSIS — Z209 Contact with and (suspected) exposure to unspecified communicable disease: Secondary | ICD-10-CM | POA: Diagnosis not present

## 2018-07-28 LAB — COMPREHENSIVE METABOLIC PANEL
ALT: 20 U/L (ref 0–44)
AST: 20 U/L (ref 15–41)
Albumin: 3.4 g/dL — ABNORMAL LOW (ref 3.5–5.0)
Alkaline Phosphatase: 59 U/L (ref 38–126)
Anion gap: 14 (ref 5–15)
BUN: 10 mg/dL (ref 6–20)
CO2: 20 mmol/L — ABNORMAL LOW (ref 22–32)
Calcium: 8.9 mg/dL (ref 8.9–10.3)
Chloride: 109 mmol/L (ref 98–111)
Creatinine, Ser: 0.6 mg/dL (ref 0.44–1.00)
GFR calc Af Amer: >60 mL/min (ref >60)
GFR calc non Af Amer: >60 mL/min (ref >60)
Glucose, Bld: 101 mg/dL — ABNORMAL HIGH (ref 70–99)
Potassium: 4.1 mmol/L (ref 3.5–5.1)
Sodium: 143 mmol/L (ref 135–145)
Total Bilirubin: 0.5 mg/dL (ref 0.3–1.2)
Total Protein: 6.7 g/dL (ref 6.5–8.1)

## 2018-07-28 LAB — CBC WITH DIFFERENTIAL/PLATELET
Abs Immature Granulocytes: 0.01 10*3/uL (ref 0.00–0.07)
Basophils Absolute: 0 10*3/uL (ref 0.0–0.1)
Basophils Relative: 1 %
Eosinophils Absolute: 0 10*3/uL (ref 0.0–0.5)
Eosinophils Relative: 0 %
HCT: 42.8 % (ref 36.0–46.0)
Hemoglobin: 14.2 g/dL (ref 12.0–15.0)
Immature Granulocytes: 0 %
Lymphocytes Relative: 60 %
Lymphs Abs: 3.3 10*3/uL (ref 0.7–4.0)
MCH: 31 pg (ref 26.0–34.0)
MCHC: 33.2 g/dL (ref 30.0–36.0)
MCV: 93.4 fL (ref 80.0–100.0)
Monocytes Absolute: 0.3 10*3/uL (ref 0.1–1.0)
Monocytes Relative: 5 %
Neutro Abs: 1.9 10*3/uL (ref 1.7–7.7)
Neutrophils Relative %: 34 %
Platelets: 387 10*3/uL (ref 150–400)
RBC: 4.58 MIL/uL (ref 3.87–5.11)
RDW: 12.9 % (ref 11.5–15.5)
WBC: 5.5 10*3/uL (ref 4.0–10.5)
nRBC: 0 % (ref 0.0–0.2)

## 2018-07-28 LAB — BRAIN NATRIURETIC PEPTIDE: B Natriuretic Peptide: 10.4 pg/mL (ref 0.0–100.0)

## 2018-07-28 LAB — SARS CORONAVIRUS 2: SARS Coronavirus 2: NOT DETECTED

## 2018-07-28 LAB — URINALYSIS, ROUTINE W REFLEX MICROSCOPIC
Bilirubin Urine: NEGATIVE
Glucose, UA: NEGATIVE mg/dL
Hgb urine dipstick: NEGATIVE
Ketones, ur: NEGATIVE mg/dL
Leukocytes,Ua: NEGATIVE
Nitrite: NEGATIVE
Protein, ur: NEGATIVE mg/dL
Specific Gravity, Urine: 1.006 (ref 1.005–1.030)
pH: 6 (ref 5.0–8.0)

## 2018-07-28 LAB — LIPASE, BLOOD: Lipase: 26 U/L (ref 11–51)

## 2018-07-28 LAB — PREGNANCY, URINE: Preg Test, Ur: NEGATIVE

## 2018-07-28 LAB — TROPONIN I: Troponin I: <0.03 ng/mL (ref <0.03)

## 2018-07-28 MED ORDER — ONDANSETRON HCL 4 MG/2ML IJ SOLN
4.0000 mg | Freq: Once | INTRAMUSCULAR | Status: AC
  Administered 2018-07-28: 4 mg via INTRAVENOUS
  Filled 2018-07-28: qty 2

## 2018-07-28 MED ORDER — LORAZEPAM 2 MG/ML IJ SOLN
1.0000 mg | Freq: Once | INTRAMUSCULAR | Status: AC
  Administered 2018-07-28: 1 mg via INTRAVENOUS
  Filled 2018-07-28: qty 1

## 2018-07-28 MED ORDER — ALBUTEROL SULFATE HFA 108 (90 BASE) MCG/ACT IN AERS
4.0000 | INHALATION_SPRAY | Freq: Once | RESPIRATORY_TRACT | Status: AC
  Administered 2018-07-28: 4 via RESPIRATORY_TRACT
  Filled 2018-07-28: qty 6.7

## 2018-07-28 MED ORDER — CYCLOBENZAPRINE HCL 10 MG PO TABS: 10.0000 mg | ORAL_TABLET | Freq: Two times a day (BID) | ORAL | 0 refills | Status: AC | PRN

## 2018-07-28 MED ORDER — MORPHINE SULFATE (PF) 4 MG/ML IV SOLN
4.0000 mg | Freq: Once | INTRAVENOUS | Status: AC
  Administered 2018-07-28: 4 mg via INTRAVENOUS
  Filled 2018-07-28: qty 1

## 2018-07-28 MED ORDER — HYDROMORPHONE HCL 1 MG/ML IJ SOLN
1.0000 mg | Freq: Once | INTRAMUSCULAR | Status: AC
  Administered 2018-07-28: 1 mg via INTRAVENOUS
  Filled 2018-07-28: qty 1

## 2018-07-28 NOTE — ED Notes (Signed)
Patient verbalizes understanding of discharge instructions. Opportunity for questioning and answers were provided. Armband removed by staff, pt discharged from ED.  

## 2018-07-28 NOTE — Discharge Instructions (Signed)
Your seen today for chest pain, nausea, vomiting and generally feeling unwell.  Your work-up was very reassuring.  There were no signs of any heart attack.  Your labs including your white blood cell, red blood cells and electrolytes and kidney and liver functions were very normal.  Your chest x-ray was normal.  Your coronavirus test was negative.  You should still stay home and get some rest and drink plenty of water.  Stay home from work until at least 10 days from the start of your symptoms and until you are feeling better and without fever for 3 days.  Thank you for allowing me to care for you today. Please return to the emergency department if you have new or worsening symptoms. Take your medications as instructed.

## 2018-07-28 NOTE — ED Notes (Signed)
Pt asking for pain med 

## 2018-07-28 NOTE — ED Provider Notes (Addendum)
[facility] EMERGENCY DEPARTMENT Provider Note   CSN: [id number] Arrival date & time: 07/28/18  1925    History   Chief Complaint No chief complaint on file.   HPI Marisa [name] is a 43 y.o. female.     Patient is a 43 year old female with past medical history of anxiety, GERD, asthma who presents the emergency department for shortness of breath and multiple other complaints.  Patient reports that she was exposed to COVID-19.  Patient reports that she had a friend staying with her for the past 2 weeks who left 1 week ago and tested positive for the virus.  Patient reports that for the past 2 days she has felt body aches with chest pain and shortness of breath.  Also has had nausea and vomiting. Last time she had any episodes of nausea and vomiting were 3 days ago.  She reports coughing and subjective fever.  She states that she also has a history of congestive heart failure and she has not been taking her Lasix for 2 months because she has not been able to follow-up with her primary care doctor.  She reports that the chest pain is stabbing in the center of her chest and is constant without exacerbating or relieving for symptoms.  Denies any leg swelling.     Past Medical History:  Diagnosis Date  . Allergy   . Anal pain    chronic  . Anemia   . Anxiety   . Asthma    exacerbation 02-28-2014 and 02-23-2014 secondary to Rhinovirus  . Chronic headaches   . Chronic low back pain   . Cyst of right ovary   . Difficult intravenous access    PER PT NEEDS PICC LINE  . Gait instability   . GERD (gastroesophageal reflux disease)   . History of adenomatous polyp of colon   . History of cardiac arrest    during SVD 1992  . History of ectopic pregnancy    2009-  S/P LEFT SALPINGECTOMY  . History of panic attacks   . IBS (irritable bowel syndrome)   . Lumbar stenosis L4 -- L5 with bulging disk   w/ right leg weakness/ decreased mobility  . Mild obstructive  sleep apnea    study 03-20-2014  no cpap recommended  . Neuromuscular disorder (HCC)    neuropathy in feet   . Sleep apnea    mild no cpap  . Type 2 diabetes mellitus ([place])   . Weakness of right leg    FROM BACK PROBLEM PER PT    Patient Active Problem List   Diagnosis Date Noted  . Obesity (BMI 30-39.9) 03/05/2018  . Panniculitis 03/05/2018  . RLQ abdominal pain   . Chronic diarrhea   . Benign neoplasm of transverse colon   . Benign neoplasm of sigmoid colon   . Neuropathic pain of both legs 06/04/2016  . Carbuncle of labium 07/12/2015  . Rash and nonspecific skin eruption 07/12/2015  . Dandruff 03/26/2015  . Nipple discharge in female 03/26/2015  . Migraine variant with headache 05/09/2014  . Unable to ambulate 05/09/2014  . Severe recurrent major depressive disorder with psychotic features ([place]) 05/04/2014  . GAD (generalized anxiety disorder) 05/04/2014  . 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  . Social anxiety disorder 05/04/2014  . PTSD (post-traumatic stress disorder) 05/04/2014  . Cigarette nicotine dependence without complication 93/79/0240  . Gait disturbance 04/11/2014  . Depression 03/27/2014  . Falls 03/27/2014  . OSA (obstructive sleep apnea) 03/06/2014  .  Insomnia 03/06/2014  . Anxiety   . History of cardiac arrest   . Acute bronchitis   . Asthma exacerbation 02/20/2014  . Well controlled type 2 diabetes mellitus ([place]) 02/20/2014  . Sore throat 02/20/2014  . Chest pain 02/20/2014  . Tachycardia 02/20/2014  . Diabetes mellitus without complication ([facility]) 78/58/8502  . Pelvic pain 07/25/2013  . Rectal bleeding 07/25/2013  . Persistent vomiting 04/27/2013  . Atypical chest pain 04/26/2013  . Abdominal pain 04/26/2013  . S/P Total vaginal hysterectomy on 01/06/13 01/06/2013  . Dyspnea 09/07/2012  . 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  . Anemia 07/30/2012  . Current smoker 07/30/2012    Past Surgical History:  Procedure Laterality Date  . ABDOMINAL  HYSTERECTOMY    . COLONOSCOPY Left 04/29/2013   Procedure: COLONOSCOPY;  Surgeon: Arta Silence, MD;  Location: [facility];  Service: Endoscopy;  Laterality: Left;  . COLONOSCOPY    . COLONOSCOPY WITH PROPOFOL N/A 08/01/2016   Procedure: COLONOSCOPY WITH PROPOFOL;  Surgeon: Doran Stabler, MD;  Location: [facility];  Service: Gastroenterology;  Laterality: N/A;  . EVALUATION UNDER ANESTHESIA WITH FISTULECTOMY N/A 04/20/2014   Procedure: EXAM UNDER ANESTHESIA ;  Surgeon: Leighton Ruff, MD;  Location: [facility];  Service: General;  Laterality: N/A;  . FLEXIBLE SIGMOIDOSCOPY N/A 11/09/2013   Procedure: FLEXIBLE SIGMOIDOSCOPY;  Surgeon: Arta Silence, MD;  Location: [facility];  Service: Endoscopy;  Laterality: N/A;  . LAPAROSCOPIC CHOLECYSTECTOMY  2005  . SPHINCTEROTOMY N/A 04/20/2014   Procedure:  LATERAL INTERNAL SPHINCTEROTOMY;  Surgeon: Leighton Ruff, MD;  Location: [facility];  Service: General;  Laterality: N/A;  . TRANSTHORACIC ECHOCARDIOGRAM  12-30-2012   mild LVH/  ef 55-60%  . UNILATERAL SALPINGECTOMY  2009   laparotomy left salpingectomy-- ectopic preg.  [facility] Kitchen UPPER GASTROINTESTINAL ENDOSCOPY    . VAGINAL HYSTERECTOMY N/A 01/06/2013   Procedure: HYSTERECTOMY VAGINAL;  Surgeon: Osborne Oman, MD;  Location: [facility];  Service: Gynecology;  Laterality: N/A;     OB History    Gravida  3   Para  1   Term  1   Preterm      AB  2   Living  1     SAB      TAB  1   Ectopic  1   Multiple      Live Births               Home Medications    Prior to Admission medications   Medication Sig Start Date End Date Taking? Authorizing Provider  albuterol (PROVENTIL HFA;VENTOLIN HFA) 108 (90 Base) MCG/ACT inhaler Inhale 1-2 puffs into the lungs every 6 (six) hours as needed for wheezing or shortness of breath. 12/29/17  Yes Fulp, Cammie, MD  atorvastatin (LIPITOR) 20 MG tablet Take 1 tablet (20 mg total) by mouth daily. 01/05/18  Yes  Fulp, Cammie, MD  ergocalciferol (VITAMIN D2) 50000 units capsule Take 1 capsule (50,000 Units total) by mouth once a week. 12/17/16  Yes Jegede, Marlena Clipper, MD  mometasone-formoterol (DULERA) 100-5 MCG/ACT AERO Inhale 2 puffs into the lungs 2 (two) times daily. Patient taking differently: Inhale 2 puffs into the lungs every other day.  12/17/16  Yes Tresa Garter, MD  cyclobenzaprine (FLEXERIL) 10 MG tablet Take 1 tablet (10 mg total) by mouth 2 (two) times daily as needed for up to 7 days for muscle spasms. 07/28/18 08/04/18  Madilyn Hook A, PA-C  gabapentin (NEURONTIN) 300 MG capsule Take 1 capsule (300  mg total) by mouth 3 (three) times daily. Patient not taking: Reported on 07/28/2018 12/29/17   Fulp, Cammie, MD  ondansetron (ZOFRAN ODT) 4 MG disintegrating tablet Take 1 tablet (4 mg total) by mouth every 8 (eight) hours as needed for nausea or vomiting. Patient not taking: Reported on 07/28/2018 01/03/18   Fredia Sorrow, MD    Family History Family History  Problem Relation Age of Onset  . Hypertension Mother   . Diabetes Mother   . Allergies Mother   . Heart disease Mother   . Clotting disorder Mother   . Cancer Father   . Hyperlipidemia Father   . Hypertension Father   . Colon cancer Father   . Heart disease Maternal Grandmother   . Breast cancer Maternal Grandmother 28  . Schizophrenia Sister   . Bipolar disorder Sister   . Clotting disorder Sister   . Bipolar disorder Brother   . Kidney disease Brother   . Bipolar disorder Sister   . Pancreatic cancer Maternal Aunt   . Prostate cancer Maternal Uncle   . Liver cancer Maternal Grandfather   . Rectal cancer Maternal Grandfather   . Liver cancer Paternal Grandfather   . Colon polyps Neg Hx   . Esophageal cancer Neg Hx   . Stomach cancer Neg Hx     Social History Social History   Tobacco Use  . Smoking status: Former Smoker    Packs/day: 0.20    Years: 11.00    Pack years: 2.20    Types: Cigarettes     Last attempt to quit: 11/17/2013    Years since quitting: 4.6  . Smokeless tobacco: Never Used  . Tobacco comment: 2 years quit  Substance Use Topics  . Alcohol use: No    Alcohol/week: 0.0 standard drinks  . Drug use: No     Allergies   Asa [aspirin]; Mushroom extract complex; Penicillins; Shellfish allergy; and Triamcinolone   Review of Systems Review of Systems  Constitutional: Positive for fatigue and fever. Negative for activity change.  HENT: Negative for rhinorrhea and sore throat.   Respiratory: Positive for cough, chest tightness and shortness of breath.   Cardiovascular: Positive for chest pain. Negative for leg swelling.  Gastrointestinal: Positive for abdominal pain, diarrhea, nausea and vomiting.  Genitourinary: Negative for dysuria and pelvic pain.  Musculoskeletal: Negative for arthralgias and back pain.  Skin: Negative for rash.  Allergic/Immunologic: Negative for immunocompromised state.  Neurological: Negative for dizziness, syncope and headaches.     Physical Exam Updated Vital Signs BP 111/70   Pulse (!) 109   Temp 98.5 F (36.9 C) (Oral)   Resp (!) 21   LMP 11/27/2012   SpO2 96%   Physical Exam Vitals signs and nursing note reviewed.  Constitutional:      General: She is not in acute distress.    Appearance: Normal appearance. She is obese. She is not ill-appearing, toxic-appearing or diaphoretic.  HENT:     Head: Normocephalic.     Nose: Nose normal.     Mouth/Throat:     Mouth: Mucous membranes are moist.  Eyes:     Conjunctiva/sclera: Conjunctivae normal.     Pupils: Pupils are equal, round, and reactive to light.  Neck:     Musculoskeletal: Normal range of motion.  Cardiovascular:     Rate and Rhythm: Normal rate and regular rhythm.     Pulses: Normal pulses.     Heart sounds: Normal heart sounds.  Pulmonary:     Effort:  Pulmonary effort is normal.     Breath sounds: Normal breath sounds.  Chest:    Abdominal:     General:  Abdomen is flat. Bowel sounds are normal.     Tenderness: There is abdominal tenderness (mild, epigastric). There is no right CVA tenderness, left CVA tenderness or guarding.  Skin:    General: Skin is dry.  Neurological:     Mental Status: She is alert.  Psychiatric:        Mood and Affect: Mood normal.      ED Treatments / Results  Labs (all labs ordered are listed, but only abnormal results are displayed) Labs Reviewed  COMPREHENSIVE METABOLIC PANEL - Abnormal; Notable for the following components:      Result Value   CO2 20 (*)    Glucose, Bld 101 (*)    Albumin 3.4 (*)    All other components within normal limits  URINALYSIS, ROUTINE W REFLEX MICROSCOPIC - Abnormal; Notable for the following components:   Color, Urine STRAW (*)    All other components within normal limits  SARS CORONAVIRUS 2  CBC WITH DIFFERENTIAL/PLATELET  BRAIN NATRIURETIC PEPTIDE  TROPONIN I  LIPASE, BLOOD  PREGNANCY, URINE    EKG EKG Interpretation  Date/Time:  Wednesday July 28 2018 19:27:33 EDT Ventricular Rate:  85 PR Interval:    QRS Duration: 79 QT Interval:  377 QTC Calculation: 449 R Axis:   -28 Text Interpretation:  Sinus rhythm Inferior infarct, old Lateral leads are also involved No acute changes No significant change since last tracing Confirmed by Varney Biles ([id number]) on 07/28/2018 8:01:58 PM   Radiology Dg Chest Port 1 View  Result Date: 07/28/2018 CLINICAL DATA:  Shortness of breath. EXAM: PORTABLE CHEST 1 VIEW COMPARISON:  Radiographs of October 16, 2016. FINDINGS: The heart size and mediastinal contours are within normal limits. Both lungs are clear. No pneumothorax or pleural effusion is noted. The visualized skeletal structures are unremarkable. IMPRESSION: No active disease. Electronically Signed   By: Marijo Conception M.D.   On: 07/28/2018 20:00    Procedures Procedures (including critical care time)  Medications Ordered in ED Medications  albuterol (VENTOLIN HFA)  108 (90 Base) MCG/ACT inhaler 4 puff (4 puffs Inhalation Given 07/28/18 2019)  ondansetron (ZOFRAN) injection 4 mg (4 mg Intravenous Given 07/28/18 2018)  morphine 4 MG/ML injection 4 mg (4 mg Intravenous Given 07/28/18 2019)  HYDROmorphone (DILAUDID) injection 1 mg (1 mg Intravenous Given 07/28/18 2215)  LORazepam (ATIVAN) injection 1 mg (1 mg Intravenous Given 07/28/18 2215)     Initial Impression / Assessment and Plan / ED Course  I have reviewed the triage vital signs and the nursing notes.  Pertinent labs & imaging results that were available during my care of the patient were reviewed by me and considered in my medical decision making (see chart for details).  Clinical Course as of Jul 27 2332  Wed Jul 28, 2018  2102 Marisa [name] was evaluated in Emergency Department on 07/28/2018 for the symptoms described in the history of present illness. She was evaluated in the context of the global COVID-19 pandemic, which necessitated consideration that the patient might be at risk for infection with the SARS-CoV-2 virus that causes COVID-19. Institutional protocols and algorithms that pertain to the evaluation of patients at risk for COVID-19 are in a state of rapid change based on information released by regulatory bodies including the CDC and federal and state organizations. These policies and algorithms were followed during the  patient's care in the ED.     [KM]  2201 Patient here for multiple complaints including chest pain, shortness of breath, diarrhea, vomiting in the context of exposure to COVID.  Patient appears very anxious.  Patient is satting 96% on room air but stating she could not breathe.  Patient is afebrile with normal pulse rate and respiratory rate.  Her lab work is unremarkable with a normal CBC, lipase, BNP, urinalysis, troponin.  She has had a normal chest x-ray. She is PERC negative with a low HEART score.  She was much improved after Dilaudid and Ativan.   [KM]     Clinical Course User Index [KM] Alveria Apley, PA-C         Final Clinical Impressions(s) / ED Diagnoses   Final diagnoses:  Chest pain, unspecified type  Fatigue, unspecified type  Nausea and vomiting, intractability of vomiting not specified, unspecified vomiting type    ED Discharge Orders         Ordered    cyclobenzaprine (FLEXERIL) 10 MG tablet  2 times daily PRN     07/28/18 2243           Alveria Apley, PA-C 07/28/18 2242    Alveria Apley, PA-C 07/28/18 2334    Varney Biles, MD 07/29/18 1806

## 2018-07-28 NOTE — ED Triage Notes (Signed)
Pt here for evaluation of three days of body aches, sob, N/V/D, cp with deep breaths, and productive cough. Pt recently spent two weeks with someone who tested positive for covid this week. Pt has hx of asthma and chf and has been out of her lasix for 2 months.

## 2018-08-01 ENCOUNTER — Telehealth: Payer: Self-pay | Admitting: *Deleted

## 2018-08-01 NOTE — Telephone Encounter (Signed)
EDCM returned call to pt and son (they called separately) regarding why pt did not receive steroid for asthma attack. There was no answer at either number and both mailboxes were full.    EDCM reviewed chart to find pt complaint was chest pain and symptoms were managed with pain Rx.  EDP not available today, EDCM will contact EDP for guidance.

## 2018-08-03 ENCOUNTER — Ambulatory Visit: Admission: RE | Admit: 2018-08-03 | Payer: Medicare Other | Source: Ambulatory Visit

## 2018-09-16 ENCOUNTER — Emergency Department (HOSPITAL_COMMUNITY): Payer: Medicare Other

## 2018-09-16 ENCOUNTER — Emergency Department (HOSPITAL_COMMUNITY)
Admission: EM | Admit: 2018-09-16 | Discharge: 2018-09-17 | Disposition: A | Discharge status: Home / Self Care | Payer: Medicare Other | Attending: Surgery | Admitting: Surgery

## 2018-09-16 ENCOUNTER — Encounter (HOSPITAL_COMMUNITY): Payer: Self-pay | Admitting: *Deleted

## 2018-09-16 DIAGNOSIS — R079 Chest pain, unspecified: Secondary | ICD-10-CM

## 2018-09-16 DIAGNOSIS — K2900 Acute gastritis without bleeding: Secondary | ICD-10-CM | POA: Diagnosis not present

## 2018-09-16 DIAGNOSIS — Z8709 Personal history of other diseases of the respiratory system: Secondary | ICD-10-CM | POA: Insufficient documentation

## 2018-09-16 DIAGNOSIS — R05 Cough: Secondary | ICD-10-CM | POA: Insufficient documentation

## 2018-09-16 DIAGNOSIS — R1013 Epigastric pain: Secondary | ICD-10-CM

## 2018-09-16 DIAGNOSIS — Z79899 Other long term (current) drug therapy: Secondary | ICD-10-CM | POA: Insufficient documentation

## 2018-09-16 DIAGNOSIS — Z87891 Personal history of nicotine dependence: Secondary | ICD-10-CM | POA: Insufficient documentation

## 2018-09-16 DIAGNOSIS — F419 Anxiety disorder, unspecified: Secondary | ICD-10-CM | POA: Diagnosis not present

## 2018-09-16 DIAGNOSIS — R0789 Other chest pain: Secondary | ICD-10-CM | POA: Diagnosis not present

## 2018-09-16 DIAGNOSIS — R0602 Shortness of breath: Secondary | ICD-10-CM | POA: Diagnosis not present

## 2018-09-16 DIAGNOSIS — E119 Type 2 diabetes mellitus without complications: Secondary | ICD-10-CM | POA: Diagnosis not present

## 2018-09-16 LAB — CBC
HCT: 41.2 % (ref 36.0–46.0)
Hemoglobin: 13.7 g/dL (ref 12.0–15.0)
MCH: 32.1 pg (ref 26.0–34.0)
MCHC: 33.3 g/dL (ref 30.0–36.0)
MCV: 96.5 fL (ref 80.0–100.0)
Platelets: 348 10*3/uL (ref 150–400)
RBC: 4.27 MIL/uL (ref 3.87–5.11)
RDW: 12.6 % (ref 11.5–15.5)
WBC: 8.8 10*3/uL (ref 4.0–10.5)
nRBC: 0 % (ref 0.0–0.2)

## 2018-09-16 LAB — COMPREHENSIVE METABOLIC PANEL
ALT: 19 U/L (ref 0–44)
AST: 17 U/L (ref 15–41)
Albumin: 3.5 g/dL (ref 3.5–5.0)
Alkaline Phosphatase: 55 U/L (ref 38–126)
Anion gap: 11 (ref 5–15)
BUN: 14 mg/dL (ref 6–20)
CO2: 19 mmol/L — ABNORMAL LOW (ref 22–32)
Calcium: 9 mg/dL (ref 8.9–10.3)
Chloride: 106 mmol/L (ref 98–111)
Creatinine, Ser: 0.63 mg/dL (ref 0.44–1.00)
GFR calc Af Amer: >60 mL/min (ref >60)
GFR calc non Af Amer: >60 mL/min (ref >60)
Glucose, Bld: 102 mg/dL — ABNORMAL HIGH (ref 70–99)
Potassium: 4 mmol/L (ref 3.5–5.1)
Sodium: 136 mmol/L (ref 135–145)
Total Bilirubin: 1.1 mg/dL (ref 0.3–1.2)
Total Protein: 7.1 g/dL (ref 6.5–8.1)

## 2018-09-16 LAB — URINALYSIS, ROUTINE W REFLEX MICROSCOPIC
Bilirubin Urine: NEGATIVE
Glucose, UA: NEGATIVE mg/dL
Hgb urine dipstick: NEGATIVE
Ketones, ur: NEGATIVE mg/dL
Leukocytes,Ua: NEGATIVE
Nitrite: NEGATIVE
Protein, ur: NEGATIVE mg/dL
Specific Gravity, Urine: 1.019 (ref 1.005–1.030)
pH: 5 (ref 5.0–8.0)

## 2018-09-16 LAB — CBG MONITORING, ED
Glucose-Capillary: 77 mg/dL (ref 70–99)
Glucose-Capillary: 79 mg/dL (ref 70–99)

## 2018-09-16 LAB — I-STAT BETA HCG BLOOD, ED (MC, WL, AP ONLY): I-stat hCG, quantitative: <5 m[IU]/mL (ref <5)

## 2018-09-16 LAB — TROPONIN I (HIGH SENSITIVITY)
Troponin I (High Sensitivity): <2 ng/L (ref <18)
Troponin I (High Sensitivity): <2 ng/L (ref <18)

## 2018-09-16 LAB — POC OCCULT BLOOD, ED: Fecal Occult Bld: NEGATIVE

## 2018-09-16 LAB — LIPASE, BLOOD: Lipase: 27 U/L (ref 11–51)

## 2018-09-16 MED ORDER — ALUM & MAG HYDROXIDE-SIMETH 200-200-20 MG/5ML PO SUSP
30.0000 mL | Freq: Once | ORAL | Status: AC
  Administered 2018-09-16: 30 mL via ORAL
  Filled 2018-09-16: qty 30

## 2018-09-16 MED ORDER — LORAZEPAM 2 MG/ML IJ SOLN
1.0000 mg | Freq: Once | INTRAMUSCULAR | Status: AC
  Administered 2018-09-16: 1 mg via INTRAVENOUS
  Filled 2018-09-16: qty 1

## 2018-09-16 MED ORDER — SODIUM CHLORIDE 0.9% FLUSH: 3.0000 mL | Freq: Once | INTRAVENOUS | Status: DC

## 2018-09-16 MED ORDER — PREDNISONE 20 MG PO TABS
60.0000 mg | ORAL_TABLET | Freq: Once | ORAL | Status: AC
  Administered 2018-09-16: 22:00:00 60 mg via ORAL
  Filled 2018-09-16: qty 3

## 2018-09-16 MED ORDER — PANTOPRAZOLE SODIUM 40 MG IV SOLR
40.0000 mg | Freq: Once | INTRAVENOUS | Status: DC
  Filled 2018-09-16: qty 40

## 2018-09-16 MED ORDER — MORPHINE SULFATE (PF) 4 MG/ML IV SOLN
4.0000 mg | Freq: Once | INTRAVENOUS | Status: AC
  Administered 2018-09-16: 4 mg via INTRAVENOUS
  Filled 2018-09-16: qty 1

## 2018-09-16 MED ORDER — PANTOPRAZOLE SODIUM 40 MG PO TBEC
40.0000 mg | DELAYED_RELEASE_TABLET | Freq: Once | ORAL | Status: AC
  Administered 2018-09-16: 22:00:00 40 mg via ORAL
  Filled 2018-09-16: qty 1

## 2018-09-16 MED ORDER — IPRATROPIUM BROMIDE HFA 17 MCG/ACT IN AERS
2.0000 | INHALATION_SPRAY | Freq: Once | RESPIRATORY_TRACT | Status: AC
  Administered 2018-09-16: 2 via RESPIRATORY_TRACT
  Filled 2018-09-16: qty 12.9

## 2018-09-16 MED ORDER — ALBUTEROL SULFATE HFA 108 (90 BASE) MCG/ACT IN AERS
8.0000 | INHALATION_SPRAY | Freq: Once | RESPIRATORY_TRACT | Status: AC
  Administered 2018-09-16: 8 via RESPIRATORY_TRACT
  Filled 2018-09-16: qty 6.7

## 2018-09-16 NOTE — ED Triage Notes (Addendum)
Pt in c/o worsening SOB with limited relief with inhaler x 2 days, pt c/o abd pain and fullness and black stools x 4 days, pt denies taking blood thinners, A&O x4, pt c/o mid CP onset x 3 wks ago

## 2018-09-16 NOTE — ED Notes (Signed)
Pt complained of feeling lightheaded, sob, and seeing black spots.  This tech looked at her labs, checked her vitals, and listened to lung sounds. I explained to her that labs were not concerning, her vitals were WNL and she had minimal wheezing bilaterally. Her 02 sats were 100%. I encouraged her that she was one of the longest waits and I would try to get her back asap.

## 2018-09-16 NOTE — ED Provider Notes (Signed)
[facility] Provider Note   CSN: [id number] Arrival date & time: 09/16/18  1108   History   Chief Complaint Chief Complaint  Patient presents with  . Abdominal Pain  . Chest Pain    HPI Marisa [name] is a 43 y.o. female who presents with SOB and abdominal pain. PMH significant for anxiety, asthma, GERD, Type 2 DM. She states that for the past several weeks she has felt like she has had an asthma attack. She reports SOB, wheezing, coughing. She also is having a lot of chest tightness. She was seen in the ED for the same in June and was r/o for COVID. She gets so SOB she feels like she is getting dizzy. She has been using her inhaler without relief. No recent surgery/travel/immobilization, hx of cancer, leg swelling, hemoptysis, prior DVT/PE, or hormone use.   Additionally she reports some upper abdominal pain. This has been going on for about the same amount of time. She has been taking Ibuprofen for her pain. She has had dark stools. She denies fever, chills. She reports a burning sensation in her throat when she lies down. She has a hx of a colonoscopy but no endoscopy. She is not on a PPI. Past surgical hx significant for cholecystecomy, hysterectomy, panniculectom   HPI  Past Medical History:  Diagnosis Date  . Allergy   . Anal pain    chronic  . Anemia   . Anxiety   . Asthma    exacerbation 02-28-2014 and 02-23-2014 secondary to Rhinovirus  . Chronic headaches   . Chronic low back pain   . Cyst of right ovary   . Difficult intravenous access    PER PT NEEDS PICC LINE  . Gait instability   . GERD (gastroesophageal reflux disease)   . History of adenomatous polyp of colon   . History of cardiac arrest    during SVD 1992  . History of ectopic pregnancy    2009-  S/P LEFT SALPINGECTOMY  . History of panic attacks   . IBS (irritable bowel syndrome)   . Lumbar stenosis L4 -- L5 with bulging disk   w/ right leg weakness/  decreased mobility  . Mild obstructive sleep apnea    study 03-20-2014  no cpap recommended  . Neuromuscular disorder (HCC)    neuropathy in feet   . Sleep apnea    mild no cpap  . Type 2 diabetes mellitus ([place])   . Weakness of right leg    FROM BACK PROBLEM PER PT    Patient Active Problem List   Diagnosis Date Noted  . Obesity (BMI 30-39.9) 03/05/2018  . Panniculitis 03/05/2018  . RLQ abdominal pain   . Chronic diarrhea   . Benign neoplasm of transverse colon   . Benign neoplasm of sigmoid colon   . Neuropathic pain of both legs 06/04/2016  . Carbuncle of labium 07/12/2015  . Rash and nonspecific skin eruption 07/12/2015  . Dandruff 03/26/2015  . Nipple discharge in female 03/26/2015  . Migraine variant with headache 05/09/2014  . Unable to ambulate 05/09/2014  . Severe recurrent major depressive disorder with psychotic features ([place]) 05/04/2014  . GAD (generalized anxiety disorder) 05/04/2014  . 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  . Social anxiety disorder 05/04/2014  . PTSD (post-traumatic stress disorder) 05/04/2014  . Cigarette nicotine dependence without complication 96/75/9163  . Gait disturbance 04/11/2014  . Depression 03/27/2014  . Falls 03/27/2014  . OSA (obstructive sleep apnea) 03/06/2014  .  Insomnia 03/06/2014  . Anxiety   . History of cardiac arrest   . Acute bronchitis   . Asthma exacerbation 02/20/2014  . Well controlled type 2 diabetes mellitus ([place]) 02/20/2014  . Sore throat 02/20/2014  . Chest pain 02/20/2014  . Tachycardia 02/20/2014  . Diabetes mellitus without complication ([place]) 85/27/7824  . Pelvic pain 07/25/2013  . Rectal bleeding 07/25/2013  . Persistent vomiting 04/27/2013  . Atypical chest pain 04/26/2013  . Abdominal pain 04/26/2013  . S/P Total vaginal hysterectomy on 01/06/13 01/06/2013  . Dyspnea 09/07/2012  . 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  . Anemia 07/30/2012  . Current smoker 07/30/2012    Past Surgical History:   Procedure Laterality Date  . ABDOMINAL HYSTERECTOMY    . COLONOSCOPY Left 04/29/2013   Procedure: COLONOSCOPY;  Surgeon: Arta Silence, MD;  Location: [facility];  Service: Endoscopy;  Laterality: Left;  . COLONOSCOPY    . COLONOSCOPY WITH PROPOFOL N/A 08/01/2016   Procedure: COLONOSCOPY WITH PROPOFOL;  Surgeon: Doran Stabler, MD;  Location: [facility];  Service: Gastroenterology;  Laterality: N/A;  . EVALUATION UNDER ANESTHESIA WITH FISTULECTOMY N/A 04/20/2014   Procedure: EXAM UNDER ANESTHESIA ;  Surgeon: Leighton Ruff, MD;  Location[facility];  Service: General;  Laterality: N/A;  . FLEXIBLE SIGMOIDOSCOPY N/A 11/09/2013   Procedure: FLEXIBLE SIGMOIDOSCOPY;  Surgeon: Arta Silence, MD;  Location: [facility];  Service: Endoscopy;  Laterality: N/A;  . LAPAROSCOPIC CHOLECYSTECTOMY  2005  . SPHINCTEROTOMY N/A 04/20/2014   Procedure:  LATERAL INTERNAL SPHINCTEROTOMY;  Surgeon: Leighton Ruff, MD;  Location[facility];  Service: General;  Laterality: N/A;  . TRANSTHORACIC ECHOCARDIOGRAM  12-30-2012   mild LVH/  ef 55-60%  . UNILATERAL SALPINGECTOMY  2009   laparotomy left salpingectomy-- ectopic preg.  [facility] UPPER GASTROINTESTINAL ENDOSCOPY    . VAGINAL HYSTERECTOMY N/A 01/06/2013   Procedure: HYSTERECTOMY VAGINAL;  Surgeon: Osborne Oman, MD;  Location: [facility];  Service: Gynecology;  Laterality: N/A;     OB History    Gravida  3   Para  1   Term  1   Preterm      AB  2   Living  1     SAB      TAB  1   Ectopic  1   Multiple      Live Births               Home Medications    Prior to Admission medications   Medication Sig Start Date End Date Taking? Authorizing Provider  albuterol (PROVENTIL HFA;VENTOLIN HFA) 108 (90 Base) MCG/ACT inhaler Inhale 1-2 puffs into the lungs every 6 (six) hours as needed for wheezing or shortness of breath. 12/29/17   Fulp, Cammie, MD  atorvastatin (LIPITOR) 20 MG tablet Take 1 tablet (20 mg  total) by mouth daily. 01/05/18   Fulp, Cammie, MD  ergocalciferol (VITAMIN D2) 50000 units capsule Take 1 capsule (50,000 Units total) by mouth once a week. 12/17/16   Tresa Garter, MD  gabapentin (NEURONTIN) 300 MG capsule Take 1 capsule (300 mg total) by mouth 3 (three) times daily. Patient not taking: Reported on 07/28/2018 12/29/17   Fulp, Ander Gaster, MD  mometasone-formoterol (DULERA) 100-5 MCG/ACT AERO Inhale 2 puffs into the lungs 2 (two) times daily. Patient taking differently: Inhale 2 puffs into the lungs every other day.  12/17/16   Tresa Garter, MD  ondansetron (ZOFRAN ODT) 4 MG disintegrating tablet Take 1 tablet (4 mg total) by  mouth every 8 (eight) hours as needed for nausea or vomiting. Patient not taking: Reported on 07/28/2018 01/03/18   Fredia Sorrow, MD    Family History Family History  Problem Relation Age of Onset  . Hypertension Mother   . Diabetes Mother   . Allergies Mother   . Heart disease Mother   . Clotting disorder Mother   . Cancer Father   . Hyperlipidemia Father   . Hypertension Father   . Colon cancer Father   . Heart disease Maternal Grandmother   . Breast cancer Maternal Grandmother 64  . Schizophrenia Sister   . Bipolar disorder Sister   . Clotting disorder Sister   . Bipolar disorder Brother   . Kidney disease Brother   . Bipolar disorder Sister   . Pancreatic cancer Maternal Aunt   . Prostate cancer Maternal Uncle   . Liver cancer Maternal Grandfather   . Rectal cancer Maternal Grandfather   . Liver cancer Paternal Grandfather   . Colon polyps Neg Hx   . Esophageal cancer Neg Hx   . Stomach cancer Neg Hx     Social History Social History   Tobacco Use  . Smoking status: Former Smoker    Packs/day: 0.20    Years: 11.00    Pack years: 2.20    Types: Cigarettes    Quit date: 11/17/2013    Years since quitting: 4.8  . Smokeless tobacco: Never Used  . Tobacco comment: 2 years quit  Substance Use Topics  . Alcohol  use: No    Alcohol/week: 0.0 standard drinks  . Drug use: No     Allergies   Asa [aspirin], Mushroom extract complex, Penicillins, Shellfish allergy, and Triamcinolone   Review of Systems Review of Systems  Constitutional: Negative for chills and fever.  Respiratory: Positive for cough, chest tightness, shortness of breath and wheezing.   Cardiovascular: Negative for chest pain, palpitations and leg swelling.  Gastrointestinal: Positive for abdominal pain and blood in stool. Negative for constipation, diarrhea, nausea and vomiting.  Genitourinary: Negative for difficulty urinating and dysuria.  Hematological: Does not bruise/bleed easily.  All other systems reviewed and are negative.    Physical Exam Updated Vital Signs BP 119/81 (BP Location: Left Arm)   Pulse 73   Temp 98 F (36.7 C) (Oral)   Resp 18   Ht 5\' 5"  (1.651 m)   Wt 94.3 kg   LMP 11/27/2012   SpO2 100%   BMI 34.61 kg/m   Physical Exam Vitals signs and nursing note reviewed.  Constitutional:      General: She is in acute distress (in pain, anxious).     Appearance: Normal appearance. She is well-developed. She is not ill-appearing.     Comments: Anxious  HENT:     Head: Normocephalic and atraumatic.  Eyes:     General: No scleral icterus.       Right eye: No discharge.        Left eye: No discharge.     Conjunctiva/sclera: Conjunctivae normal.     Pupils: Pupils are equal, round, and reactive to light.  Neck:     Musculoskeletal: Normal range of motion.  Cardiovascular:     Rate and Rhythm: Normal rate and regular rhythm.  Pulmonary:     Effort: Pulmonary effort is normal. No respiratory distress.     Breath sounds: Normal breath sounds.  Abdominal:     General: There is no distension.     Palpations: Abdomen is soft.  Tenderness: There is abdominal tenderness (epigastric).  Musculoskeletal:     Right lower leg: No edema.     Left lower leg: No edema.  Skin:    General: Skin is warm and  dry.  Neurological:     Mental Status: She is alert and oriented to person, place, and time.  Psychiatric:        Behavior: Behavior normal.      ED Treatments / Results  Labs (all labs ordered are listed, but only abnormal results are displayed) Labs Reviewed  COMPREHENSIVE METABOLIC PANEL - Abnormal; Notable for the following components:      Result Value   CO2 19 (*)    Glucose, Bld 102 (*)    All other components within normal limits  LIPASE, BLOOD  CBC  URINALYSIS, ROUTINE W REFLEX MICROSCOPIC  I-STAT BETA HCG BLOOD, ED ([facility], [facility], [facility] ONLY)  CBG MONITORING, ED  CBG MONITORING, ED  POC OCCULT BLOOD, ED  TROPONIN I (HIGH SENSITIVITY)  TROPONIN I (HIGH SENSITIVITY)    EKG None  Radiology Dg Abd Acute W/chest  Result Date: 09/16/2018 CLINICAL DATA:  Worsening shortness of breath over the past 2 days. Abdominal pain and fullness for 4 days. EXAM: DG ABDOMEN ACUTE W/ 1V CHEST COMPARISON:  CT abdomen pelvis 06/10/2016. Chest and two views abdomen 10/10/2013. FINDINGS: Single view of the chest demonstrates low lung volumes. Lungs are clear. No pneumothorax or pleural effusion. Heart size is normal. Two views of the abdomen show no free intraperitoneal air. Bowel gas pattern is normal. No unexpected abdominal calcification or bony abnormality. Cholecystectomy clips noted. IMPRESSION: Negative exam. Electronically Signed   By: Inge Rise M.D.   On: 09/16/2018 21:31    Procedures Procedures (including critical care time)  Medications Ordered in ED Medications  sodium chloride flush (NS) 0.9 % injection 3 mL (has no administration in time range)  albuterol (VENTOLIN HFA) 108 (90 Base) MCG/ACT inhaler 8 puff (8 puffs Inhalation Given 09/16/18 2159)  ipratropium (ATROVENT HFA) inhaler 2 puff (2 puffs Inhalation Given 09/16/18 2159)  alum & mag hydroxide-simeth (MAALOX/MYLANTA) 200-200-20 MG/5ML suspension 30 mL (30 mLs Oral Given 09/16/18 2156)  predniSONE (DELTASONE) tablet 60  mg (60 mg Oral Given 09/16/18 2156)  pantoprazole (PROTONIX) EC tablet 40 mg (40 mg Oral Given 09/16/18 2156)     Initial Impression / Assessment and Plan / ED Course  I have reviewed the triage vital signs and the nursing notes.  Pertinent labs & imaging results that were available during my care of the patient were reviewed by me and considered in my medical decision making (see chart for details).  43 year old female presents with SOB, wheezing, cough, chest tightness and abdominal pain with black stools. Work up here is very reassuring however clinically the patient is clearly uncomfortable. Her vitals are normal. Heart is regular rate and rhythm. Lungs are CTA. Abdomen is tender in the epigastric area. Rectal exam is normal. Labs ordered in triage show normal CBC, normal CMP and lipase. Trop is negative x 2. Preg test is negative. Acute chest and abdominal xray was ordered and is negative. Will give Protonix, GI cocktail, breathing tx and Prednisone and recheck  11:15 PM Rechecked pt. She feels worse. Abdominal pain is now radiating to the lower abdomen. Hemoccult was performed and is negative. Will order CTA of chest to r/o PE since symptoms do not seem consistent with asthma. Will also order CT of abdomen/pelvis since she is reporting severe abdominal pain and  is distressed. Will give Morphine and Ativan - may be anxiety component  At shift change, CT is pending. Care signed out to [place] who will dispo   Final Clinical Impressions(s) / ED Diagnoses   Final diagnoses:  Epigastric pain    ED Discharge Orders    None       Recardo Evangelist, PA-C 09/17/18 0113    Davonna Belling, MD 09/23/18 2328

## 2018-09-17 ENCOUNTER — Emergency Department (HOSPITAL_COMMUNITY): Payer: Medicare Other

## 2018-09-17 DIAGNOSIS — R1013 Epigastric pain: Secondary | ICD-10-CM | POA: Diagnosis not present

## 2018-09-17 MED ORDER — IOHEXOL 350 MG/ML SOLN
100.0000 mL | Freq: Once | INTRAVENOUS | Status: AC | PRN
  Administered 2018-09-17: 100 mL via INTRAVENOUS

## 2018-09-17 MED ORDER — SUCRALFATE 1 G PO TABS: 1.0000 g | ORAL_TABLET | Freq: Three times a day (TID) | ORAL | 0 refills | Status: DC

## 2018-09-17 MED ORDER — PANTOPRAZOLE SODIUM 20 MG PO TBEC: 20.0000 mg | DELAYED_RELEASE_TABLET | Freq: Every day | ORAL | 0 refills | Status: DC

## 2018-09-17 NOTE — Discharge Instructions (Addendum)
1. Medications: Protonix, Carafate, usual home medications; STOP taking ibuprofen. 2. Treatment: rest, drink plenty of fluids, advance diet slowly; avoid acidic or spicy foods.  Avoid alcohol usage. 3. Follow Up: Please followup with your primary doctor in 2 days and gastroenterology in 1 week for discussion of your diagnoses and further evaluation after today's visit; if you do not have a primary care doctor use the resource guide provided to find one; Please return to the ER for persistent vomiting, high fevers or worsening symptoms

## 2018-09-17 NOTE — ED Provider Notes (Signed)
**Note Marisa-Identified via Obfuscation** Care assumed from [facility], [place].  Please see her full H&P.  In short,  Marisa [name] is a 43 y.o. female presents for SOB, wheezing, cough and abd pain x several weeks.  PERC neg, but 2nd visit for this complaint of SOB. No wheezing on exam. Pt also c/o epigastric abd pain with black stools.  Pt has been taking NSAIDs at home.  KUB without free air.  Rectal neg for blood.  Neg COVID several weeks ago.    Physical Exam  BP 129/81   Pulse 77   Temp 98 F (36.7 C) (Oral)   Resp 16   Ht 5\' 5"  (1.651 m)   Wt 94.3 kg   LMP 11/27/2012   SpO2 98%   BMI 34.61 kg/m   Physical Exam Vitals signs and nursing note reviewed.  Constitutional:      General: She is not in acute distress.    Appearance: She is well-developed.  HENT:     Head: Normocephalic.  Eyes:     General: No scleral icterus.    Conjunctiva/sclera: Conjunctivae normal.  Neck:     Musculoskeletal: Normal range of motion.  Cardiovascular:     Rate and Rhythm: Normal rate.  Pulmonary:     Effort: Pulmonary effort is normal.  Musculoskeletal: Normal range of motion.  Skin:    General: Skin is warm and dry.  Neurological:     Mental Status: She is alert.     ED Course/Procedures   Clinical Course as of Sep 17 151  Fri Sep 17, 2018  0028 Plan: CT chest for PE and Ct abd for persistent abd pain.   [HM]  0148 Patient reports she is feeling some better.  She is requesting to eat.   [HM]  0148 Glucose, UA: NEGATIVE [HM]    Clinical Course User Index [HM] Allis Quirarte, Jarrett Soho, PA-C     Ct Angio Chest Pe W/cm &/or Wo Cm  Result Date: 09/17/2018 CLINICAL DATA:  Chest pain and bloody stool EXAM: CT ANGIOGRAPHY CHEST CT ABDOMEN AND PELVIS WITH CONTRAST TECHNIQUE: Multidetector CT imaging of the chest was performed using the standard protocol during bolus administration of intravenous contrast. Multiplanar CT image reconstructions and MIPs were obtained to evaluate the vascular anatomy. Multidetector CT  imaging of the abdomen and pelvis was performed using the standard protocol during bolus administration of intravenous contrast. CONTRAST:  149mL OMNIPAQUE IOHEXOL 350 MG/ML SOLN COMPARISON:  CT chest 03/10/2017, CT abdomen pelvis 06/11/2016 FINDINGS: CTA CHEST FINDINGS Cardiovascular: Satisfactory opacification of the pulmonary arteries to the segmental level. No evidence of pulmonary embolism. Borderline cardiomegaly. No pericardial effusion. Nonaneurysmal aorta. No dissection. Mediastinum/Nodes: No enlarged mediastinal, hilar, or axillary lymph nodes. Midline trachea. Esophagus within normal limits. Subcentimeter hypodensity left lobe of thyroid, no change. Lungs/Pleura: Lungs are clear. No pleural effusion or pneumothorax. Musculoskeletal: No chest wall abnormality. No acute or significant osseous findings. Review of the MIP images confirms the above findings. CT ABDOMEN and PELVIS FINDINGS Hepatobiliary: Hepatic steatosis. Clips at the gallbladder fossa. No biliary dilatation Pancreas: Unremarkable. No pancreatic ductal dilatation or surrounding inflammatory changes. Spleen: Normal in size without focal abnormality. Adrenals/Urinary Tract: Adrenal glands are unremarkable. Kidneys are normal, without renal calculi, focal lesion, or hydronephrosis. Bladder is unremarkable. Stomach/Bowel: Stomach is within normal limits. Appendix appears normal. No evidence of bowel wall thickening, distention, or inflammatory changes. Vascular/Lymphatic: No significant vascular findings are present. No enlarged abdominal or pelvic lymph nodes. Reproductive: Status post hysterectomy. No adnexal masses. Other: Negative for  free air or free fluid Musculoskeletal: No acute or significant osseous findings. Review of the MIP images confirms the above findings. IMPRESSION: 1. Negative for acute pulmonary embolus or aortic dissection. Clear lung fields. 2. No CT evidence for acute intra-abdominal or pelvic abnormality 3. Mild hepatic  steatosis Electronically Signed   By: Donavan Foil M.D.   On: 09/17/2018 01:25   Ct Abdomen Pelvis W Contrast  Result Date: 09/17/2018 CLINICAL DATA:  Chest pain and bloody stool EXAM: CT ANGIOGRAPHY CHEST CT ABDOMEN AND PELVIS WITH CONTRAST TECHNIQUE: Multidetector CT imaging of the chest was performed using the standard protocol during bolus administration of intravenous contrast. Multiplanar CT image reconstructions and MIPs were obtained to evaluate the vascular anatomy. Multidetector CT imaging of the abdomen and pelvis was performed using the standard protocol during bolus administration of intravenous contrast. CONTRAST:  125mL OMNIPAQUE IOHEXOL 350 MG/ML SOLN COMPARISON:  CT chest 03/10/2017, CT abdomen pelvis 06/11/2016 FINDINGS: CTA CHEST FINDINGS Cardiovascular: Satisfactory opacification of the pulmonary arteries to the segmental level. No evidence of pulmonary embolism. Borderline cardiomegaly. No pericardial effusion. Nonaneurysmal aorta. No dissection. Mediastinum/Nodes: No enlarged mediastinal, hilar, or axillary lymph nodes. Midline trachea. Esophagus within normal limits. Subcentimeter hypodensity left lobe of thyroid, no change. Lungs/Pleura: Lungs are clear. No pleural effusion or pneumothorax. Musculoskeletal: No chest wall abnormality. No acute or significant osseous findings. Review of the MIP images confirms the above findings. CT ABDOMEN and PELVIS FINDINGS Hepatobiliary: Hepatic steatosis. Clips at the gallbladder fossa. No biliary dilatation Pancreas: Unremarkable. No pancreatic ductal dilatation or surrounding inflammatory changes. Spleen: Normal in size without focal abnormality. Adrenals/Urinary Tract: Adrenal glands are unremarkable. Kidneys are normal, without renal calculi, focal lesion, or hydronephrosis. Bladder is unremarkable. Stomach/Bowel: Stomach is within normal limits. Appendix appears normal. No evidence of bowel wall thickening, distention, or inflammatory changes.  Vascular/Lymphatic: No significant vascular findings are present. No enlarged abdominal or pelvic lymph nodes. Reproductive: Status post hysterectomy. No adnexal masses. Other: Negative for free air or free fluid Musculoskeletal: No acute or significant osseous findings. Review of the MIP images confirms the above findings. IMPRESSION: 1. Negative for acute pulmonary embolus or aortic dissection. Clear lung fields. 2. No CT evidence for acute intra-abdominal or pelvic abnormality 3. Mild hepatic steatosis Electronically Signed   By: Donavan Foil M.D.   On: 09/17/2018 01:25     MDM   Patient with work-up here in the emergency room for chest pain and abdominal pain.  Patient is without hypoxia or tachypnea.  No evidence of significant asthma exacerbation.  Fecal occult is negative, lab work is reassuring.  Troponin is negative.  CT scan of the chest is without evidence of pulmonary embolism, pneumonia or pneumothorax.  No groundglass opacities or masses.  CT scan of the abdomen is without evidence for acute intra-abdominal pathology.  Patient is status post cholecystectomy and there is no biliary dilatation.  Suspect patient's pain is likely secondary to gastritis and reflux secondary to her NSAID usage.  She will be givenfor worsening chest pain, shortness of breath, high fevers, cough or any other concerns.  Patient states understanding and is in agreement with the plan.  TProtonix, Carafate and referred to GI.  Discussed reasons to return immediately to the emergency department including      ICD-10-CM   1. Epigastric pain  R10.13   2. Central chest pain  R07.9   3. Shortness of breath  R06.02   4. Acute gastritis, presence of bleeding unspecified, unspecified gastritis type  K29.00  Joshuajames Moehring, Gwenlyn Perking 09/17/18 0153    Ripley Fraise, MD 09/17/18 [phone]

## 2018-09-22 ENCOUNTER — Emergency Department (HOSPITAL_COMMUNITY)
Admission: EM | Admit: 2018-09-22 | Discharge: 2018-09-22 | Disposition: A | Discharge status: Home / Self Care | Payer: Medicare Other | Attending: Emergency Medicine | Admitting: Emergency Medicine

## 2018-09-22 ENCOUNTER — Encounter (HOSPITAL_COMMUNITY): Payer: Self-pay

## 2018-09-22 ENCOUNTER — Other Ambulatory Visit: Payer: Self-pay

## 2018-09-22 ENCOUNTER — Emergency Department (HOSPITAL_COMMUNITY): Payer: Medicare Other

## 2018-09-22 DIAGNOSIS — M791 Myalgia, unspecified site: Secondary | ICD-10-CM | POA: Diagnosis not present

## 2018-09-22 DIAGNOSIS — R1013 Epigastric pain: Secondary | ICD-10-CM | POA: Diagnosis present

## 2018-09-22 DIAGNOSIS — D125 Benign neoplasm of sigmoid colon: Secondary | ICD-10-CM | POA: Diagnosis not present

## 2018-09-22 DIAGNOSIS — R05 Cough: Secondary | ICD-10-CM | POA: Insufficient documentation

## 2018-09-22 DIAGNOSIS — Z87891 Personal history of nicotine dependence: Secondary | ICD-10-CM | POA: Diagnosis not present

## 2018-09-22 DIAGNOSIS — D123 Benign neoplasm of transverse colon: Secondary | ICD-10-CM | POA: Insufficient documentation

## 2018-09-22 DIAGNOSIS — R072 Precordial pain: Secondary | ICD-10-CM

## 2018-09-22 DIAGNOSIS — R110 Nausea: Secondary | ICD-10-CM | POA: Insufficient documentation

## 2018-09-22 DIAGNOSIS — R509 Fever, unspecified: Secondary | ICD-10-CM | POA: Diagnosis not present

## 2018-09-22 DIAGNOSIS — J45909 Unspecified asthma, uncomplicated: Secondary | ICD-10-CM | POA: Diagnosis not present

## 2018-09-22 DIAGNOSIS — Z79899 Other long term (current) drug therapy: Secondary | ICD-10-CM | POA: Diagnosis not present

## 2018-09-22 DIAGNOSIS — E119 Type 2 diabetes mellitus without complications: Secondary | ICD-10-CM | POA: Insufficient documentation

## 2018-09-22 LAB — CBC
HCT: 40.1 % (ref 36.0–46.0)
Hemoglobin: 13.1 g/dL (ref 12.0–15.0)
MCH: 31.8 pg (ref 26.0–34.0)
MCHC: 32.7 g/dL (ref 30.0–36.0)
MCV: 97.3 fL (ref 80.0–100.0)
Platelets: 377 10*3/uL (ref 150–400)
RBC: 4.12 MIL/uL (ref 3.87–5.11)
RDW: 12.7 % (ref 11.5–15.5)
WBC: 6.8 10*3/uL (ref 4.0–10.5)
nRBC: 0 % (ref 0.0–0.2)

## 2018-09-22 LAB — BASIC METABOLIC PANEL
Anion gap: 11 (ref 5–15)
BUN: 8 mg/dL (ref 6–20)
CO2: 20 mmol/L — ABNORMAL LOW (ref 22–32)
Calcium: 9.2 mg/dL (ref 8.9–10.3)
Chloride: 106 mmol/L (ref 98–111)
Creatinine, Ser: 0.59 mg/dL (ref 0.44–1.00)
GFR calc Af Amer: >60 mL/min (ref >60)
GFR calc non Af Amer: >60 mL/min (ref >60)
Glucose, Bld: 99 mg/dL (ref 70–99)
Potassium: 3.8 mmol/L (ref 3.5–5.1)
Sodium: 137 mmol/L (ref 135–145)

## 2018-09-22 LAB — TROPONIN I (HIGH SENSITIVITY)
Troponin I (High Sensitivity): <2 ng/L (ref <18)
Troponin I (High Sensitivity): <2 ng/L (ref <18)

## 2018-09-22 MED ORDER — LIDOCAINE VISCOUS HCL 2 % MT SOLN
15.0000 mL | Freq: Once | OROMUCOSAL | Status: AC
  Administered 2018-09-22: 15 mL via ORAL
  Filled 2018-09-22: qty 15

## 2018-09-22 MED ORDER — FENTANYL CITRATE (PF) 100 MCG/2ML IJ SOLN
50.0000 ug | Freq: Once | INTRAMUSCULAR | Status: AC
  Administered 2018-09-22: 50 ug via INTRAVENOUS
  Filled 2018-09-22: qty 2

## 2018-09-22 MED ORDER — SUCRALFATE 1 G PO TABS
1.0000 g | ORAL_TABLET | Freq: Once | ORAL | Status: AC
  Administered 2018-09-22: 11:00:00 1 g via ORAL
  Filled 2018-09-22: qty 1

## 2018-09-22 MED ORDER — SUCRALFATE 1 G PO TABS: 1.0000 g | ORAL_TABLET | Freq: Three times a day (TID) | ORAL | 0 refills | Status: DC

## 2018-09-22 MED ORDER — ALUM & MAG HYDROXIDE-SIMETH 200-200-20 MG/5ML PO SUSP
30.0000 mL | Freq: Once | ORAL | Status: AC
  Administered 2018-09-22: 30 mL via ORAL
  Filled 2018-09-22: qty 30

## 2018-09-22 MED ORDER — SODIUM CHLORIDE 0.9% FLUSH
3.0000 mL | Freq: Once | INTRAVENOUS | Status: AC
  Administered 2018-09-22: 3 mL via INTRAVENOUS

## 2018-09-22 NOTE — ED Provider Notes (Addendum)
[facility] Provider Note   CSN: [id number] Arrival date & time: 09/22/18  6160     History   Chief Complaint Chief Complaint  Patient presents with  . cp/ seen earlier in week    HPI Marisa [name] is a 43 y.o. female.     The history is provided by the patient and medical records. No language interpreter was used.     43 year old female with history of chronic pain, asthma, anxiety, GERD, cardiac history, panic attack, diabetes, IBS presenting for evaluation of chest pain.  Patient report for nearly a month she has had persistent pain in her upper abdomen radiates to her mid chest.  Pain is described as an uncomfortable pressure crampy sharp sensation worse at nighttime and sometimes worse with movement.  Mild nausea without vomiting.  She reported having a fever was 104 several days prior and has had body aches.  She endorsed occasional nonproductive cough.  She tries taking medication prescribed but states it did not provide adequate relief.  No runny nose sneezing sore throat.  Denies any recent sick contact with anyone with COVID-19.  She was seen in the ED less than a week ago for the same complaint but states no diagnosis was found.  Patient has had recent COVID-19 test that was negative.  She denies any recent NSAID use.  She denies any recent travel.  Past Medical History:  Diagnosis Date  . Allergy   . Anal pain    chronic  . Anemia   . Anxiety   . Asthma    exacerbation 02-28-2014 and 02-23-2014 secondary to Rhinovirus  . Chronic headaches   . Chronic low back pain   . Cyst of right ovary   . Difficult intravenous access    PER PT NEEDS PICC LINE  . Gait instability   . GERD (gastroesophageal reflux disease)   . History of adenomatous polyp of colon   . History of cardiac arrest    during SVD 1992  . History of ectopic pregnancy    2009-  S/P LEFT SALPINGECTOMY  . History of panic attacks   . IBS (irritable bowel  syndrome)   . Lumbar stenosis L4 -- L5 with bulging disk   w/ right leg weakness/ decreased mobility  . Mild obstructive sleep apnea    study 03-20-2014  no cpap recommended  . Neuromuscular disorder (HCC)    neuropathy in feet   . Sleep apnea    mild no cpap  . Type 2 diabetes mellitus ([place])   . Weakness of right leg    FROM BACK PROBLEM PER PT    Patient Active Problem List   Diagnosis Date Noted  . Obesity (BMI 30-39.9) 03/05/2018  . Panniculitis 03/05/2018  . RLQ abdominal pain   . Chronic diarrhea   . Benign neoplasm of transverse colon   . Benign neoplasm of sigmoid colon   . Neuropathic pain of both legs 06/04/2016  . Carbuncle of labium 07/12/2015  . Rash and nonspecific skin eruption 07/12/2015  . Dandruff 03/26/2015  . Nipple discharge in female 03/26/2015  . Migraine variant with headache 05/09/2014  . Unable to ambulate 05/09/2014  . Severe recurrent major depressive disorder with psychotic features ([place]) 05/04/2014  . GAD (generalized anxiety disorder) 05/04/2014  . 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  . Social anxiety disorder 05/04/2014  . PTSD (post-traumatic stress disorder) 05/04/2014  . Cigarette nicotine dependence without complication 73/71/0626  . Gait disturbance 04/11/2014  .  Depression 03/27/2014  . Falls 03/27/2014  . OSA (obstructive sleep apnea) 03/06/2014  . Insomnia 03/06/2014  . Anxiety   . History of cardiac arrest   . Acute bronchitis   . Asthma exacerbation 02/20/2014  . Well controlled type 2 diabetes mellitus ([place]) 02/20/2014  . Sore throat 02/20/2014  . Chest pain 02/20/2014  . Tachycardia 02/20/2014  . Diabetes mellitus without complication ([place]) 25/95/6387  . Pelvic pain 07/25/2013  . Rectal bleeding 07/25/2013  . Persistent vomiting 04/27/2013  . Atypical chest pain 04/26/2013  . Abdominal pain 04/26/2013  . S/P Total vaginal hysterectomy on 01/06/13 01/06/2013  . Dyspnea 09/07/2012  . 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   . Anemia 07/30/2012  . Current smoker 07/30/2012    Past Surgical History:  Procedure Laterality Date  . ABDOMINAL HYSTERECTOMY    . COLONOSCOPY Left 04/29/2013   Procedure: COLONOSCOPY;  Surgeon: Arta Silence, MD;  Location: [facility];  Service: Endoscopy;  Laterality: Left;  . COLONOSCOPY    . COLONOSCOPY WITH PROPOFOL N/A 08/01/2016   Procedure: COLONOSCOPY WITH PROPOFOL;  Surgeon: Doran Stabler, MD;  Location: [facility];  Service: Gastroenterology;  Laterality: N/A;  . EVALUATION UNDER ANESTHESIA WITH FISTULECTOMY N/A 04/20/2014   Procedure: EXAM UNDER ANESTHESIA ;  Surgeon: Leighton Ruff, MD;  Location: [facility];  Service: General;  Laterality: N/A;  . FLEXIBLE SIGMOIDOSCOPY N/A 11/09/2013   Procedure: FLEXIBLE SIGMOIDOSCOPY;  Surgeon: Arta Silence, MD;  Location: [facility];  Service: Endoscopy;  Laterality: N/A;  . LAPAROSCOPIC CHOLECYSTECTOMY  2005  . SPHINCTEROTOMY N/A 04/20/2014   Procedure:  LATERAL INTERNAL SPHINCTEROTOMY;  Surgeon: Leighton Ruff, MD;  Location: [facility];  Service: General;  Laterality: N/A;  . TRANSTHORACIC ECHOCARDIOGRAM  12-30-2012   mild LVH/  ef 55-60%  . UNILATERAL SALPINGECTOMY  2009   laparotomy left salpingectomy-- ectopic preg.  [facility] UPPER GASTROINTESTINAL ENDOSCOPY    . VAGINAL HYSTERECTOMY N/A 01/06/2013   Procedure: HYSTERECTOMY VAGINAL;  Surgeon: Osborne Oman, MD;  Location: [facility];  Service: Gynecology;  Laterality: N/A;     OB History    Gravida  3   Para  1   Term  1   Preterm      AB  2   Living  1     SAB      TAB  1   Ectopic  1   Multiple      Live Births               Home Medications    Prior to Admission medications   Medication Sig Start Date End Date Taking? Authorizing Provider  albuterol (PROVENTIL HFA;VENTOLIN HFA) 108 (90 Base) MCG/ACT inhaler Inhale 1-2 puffs into the lungs every 6 (six) hours as needed for wheezing or shortness of breath. 12/29/17    Fulp, Cammie, MD  cyclobenzaprine (FLEXERIL) 10 MG tablet Take 10 mg by mouth daily. 07/29/18   [provider]  gabapentin (NEURONTIN) 300 MG capsule Take 1 capsule (300 mg total) by mouth 3 (three) times daily. Patient taking differently: Take 300 mg by mouth daily.  12/29/17   Fulp, Cammie, MD  ibuprofen (ADVIL) 200 MG tablet Take 400 mg by mouth every 6 (six) hours as needed for moderate pain.    [provider]  mometasone-formoterol (DULERA) 100-5 MCG/ACT AERO Inhale 2 puffs into the lungs 2 (two) times daily. Patient taking differently: Inhale 2 puffs into the lungs every other day.  12/17/16   Angelica Chessman  E, MD  ondansetron (ZOFRAN ODT) 4 MG disintegrating tablet Take 1 tablet (4 mg total) by mouth every 8 (eight) hours as needed for nausea or vomiting. 01/03/18   Fredia Sorrow, MD  pantoprazole (PROTONIX) 20 MG tablet Take 1 tablet (20 mg total) by mouth daily. 09/17/18   Muthersbaugh, Jarrett Soho, PA-C  PRESCRIPTION MEDICATION Metformin    [provider]  sucralfate (CARAFATE) 1 g tablet Take 1 tablet (1 g total) by mouth 4 (four) times daily -  with meals and at bedtime. 09/17/18   Muthersbaugh, Jarrett Soho, PA-C    Family History Family History  Problem Relation Age of Onset  . Hypertension Mother   . Diabetes Mother   . Allergies Mother   . Heart disease Mother   . Clotting disorder Mother   . Cancer Father   . Hyperlipidemia Father   . Hypertension Father   . Colon cancer Father   . Heart disease Maternal Grandmother   . Breast cancer Maternal Grandmother 98  . Schizophrenia Sister   . Bipolar disorder Sister   . Clotting disorder Sister   . Bipolar disorder Brother   . Kidney disease Brother   . Bipolar disorder Sister   . Pancreatic cancer Maternal Aunt   . Prostate cancer Maternal Uncle   . Liver cancer Maternal Grandfather   . Rectal cancer Maternal Grandfather   . Liver cancer Paternal Grandfather   . Colon polyps Neg Hx   .  Esophageal cancer Neg Hx   . Stomach cancer Neg Hx     Social History Social History   Tobacco Use  . Smoking status: Former Smoker    Packs/day: 0.20    Years: 11.00    Pack years: 2.20    Types: Cigarettes    Quit date: 11/17/2013    Years since quitting: 4.8  . Smokeless tobacco: Never Used  . Tobacco comment: 2 years quit  Substance Use Topics  . Alcohol use: No    Alcohol/week: 0.0 standard drinks  . Drug use: No     Allergies   Asa [aspirin], Mushroom extract complex, Penicillins, Shellfish allergy, and Triamcinolone   Review of Systems Review of Systems  All other systems reviewed and are negative.    Physical Exam Updated Vital Signs BP 118/83 (BP Location: Left Arm)   Pulse 78   Temp 97.7 F (36.5 C) (Oral)   Resp 19   LMP 11/27/2012   SpO2 99%   Physical Exam Vitals signs and nursing note reviewed.  Constitutional:      General: She is not in acute distress.    Appearance: She is well-developed. She is obese.     Comments: Patient appears uncomfortable but nontoxic  HENT:     Head: Atraumatic.  Eyes:     Conjunctiva/sclera: Conjunctivae normal.  Neck:     Musculoskeletal: Neck supple.  Cardiovascular:     Rate and Rhythm: Normal rate and regular rhythm.  Pulmonary:     Effort: Pulmonary effort is normal.     Breath sounds: Normal breath sounds.  Chest:     Chest wall: Tenderness (Tenderness to anterior chest wall on palpation without overlying skin changes.) present.  Abdominal:     Palpations: Abdomen is soft.     Tenderness: There is abdominal tenderness (Tenderness to epigastric region on palpation without guarding rebound tenderness).  Musculoskeletal:        General: No swelling.  Skin:    Findings: No rash.  Neurological:     Mental  Status: She is alert and oriented to person, place, and time.      ED Treatments / Results  Labs (all labs ordered are listed, but only abnormal results are displayed) Labs Reviewed  BASIC  METABOLIC PANEL - Abnormal; Notable for the following components:      Result Value   CO2 20 (*)    All other components within normal limits  CBC  TROPONIN I (HIGH SENSITIVITY)  TROPONIN I (HIGH SENSITIVITY)    EKG None  ED ECG REPORT   Date: 09/22/2018  Rate: 87  Rhythm: normal sinus rhythm  QRS Axis: left  Intervals: normal  ST/T Wave abnormalities: nonspecific ST changes  Conduction Disutrbances:none  Narrative Interpretation:   Old EKG Reviewed: unchanged  I have personally reviewed the EKG tracing and agree with the computerized printout as noted.   Radiology Dg Chest Portable 1 View  Result Date: 09/22/2018 CLINICAL DATA:  Chest pain EXAM: PORTABLE CHEST 1 VIEW COMPARISON:  July 28, 2018 FINDINGS: The lungs are clear without focal consolidation, edema, effusion or pneumothorax. Cardiomediastinal silhouette is within normal limits for size. No acute osseous findings. IMPRESSION: No acute cardiopulmonary process. Electronically Signed   By: Prudencio Pair M.D.   On: 09/22/2018 11:25    Procedures Procedures (including critical care time)  Medications Ordered in ED Medications  sodium chloride flush (NS) 0.9 % injection 3 mL (3 mLs Intravenous Given 09/22/18 1032)  alum & mag hydroxide-simeth (MAALOX/MYLANTA) 200-200-20 MG/5ML suspension 30 mL (30 mLs Oral Given 09/22/18 1044)    And  lidocaine (XYLOCAINE) 2 % viscous mouth solution 15 mL (15 mLs Oral Given 09/22/18 1044)  sucralfate (CARAFATE) tablet 1 g (1 g Oral Given 09/22/18 1045)  fentaNYL (SUBLIMAZE) injection 50 mcg (50 mcg Intravenous Given 09/22/18 1311)     Initial Impression / Assessment and Plan / ED Course  I have reviewed the triage vital signs and the nursing notes.  Pertinent labs & imaging results that were available during my care of the patient were reviewed by me and considered in my medical decision making (see chart for details).        BP 106/74   Pulse 70   Temp 97.7 F (36.5 C) (Oral)   Resp  (!) 21   LMP 11/27/2012   SpO2 99%    Final Clinical Impressions(s) / ED Diagnoses   Final diagnoses:  Epigastric abdominal pain  Substernal chest pain    ED Discharge Orders         Ordered    sucralfate (CARAFATE) 1 g tablet  3 times daily with meals & bedtime     09/22/18 1342         10:43 AM Patient complaining of epigastric pain radiates to her mid chest which has been ongoing for nearly a month.  She also endorsed having fever of 104 several days prior however she is afebrile here.  She has been seen evaluate for her condition nearly a week ago.  She has had extensive work-up including chest CT angiogram as well as abdominal pelvis CT scan without any acute finding.  Recent COVID 19 testing was negative.  At this time, I will perform screening labs however I have low suspicion for acute emergent medical problem and have low suspicion for COVID-19 causing her symptoms.  Suspect component of gastritis/GERD.  1:31 PM Lab studies are reassuring, no concerning changes.  Negative delta trop, doubt ACS.  She did report my improvement with GI cocktail initially but now pt  report increase discomfort. Additional pain medication given.  Pt does have an appointment with GI specialist in 2 weeks. I recommend pt to also treat herself as if she has covid-19 by self quarantine, masking and regular hand washing.  sxs treatment and return if worsen.  Pt voice understanding.    Marisa [name] was evaluated in Emergency Department on 09/22/2018 for the symptoms described in the history of present illness. She was evaluated in the context of the global COVID-19 pandemic, which necessitated consideration that the patient might be at risk for infection with the SARS-CoV-2 virus that causes COVID-19. Institutional protocols and algorithms that pertain to the evaluation of patients at risk for COVID-19 are in a state of rapid change based on information released by regulatory bodies including the CDC  and federal and state organizations. These policies and algorithms were followed during the patient's care in the ED.    Domenic Moras, PA-C 09/22/18 1345    Domenic Moras, PA-C 09/22/18 1345    Long, Wonda Olds, MD 09/22/18 2015

## 2018-09-22 NOTE — ED Triage Notes (Signed)
Patient complains of ongoing sharp CP and pain worse with inspiration. Seen last Thursday for same and no diagnosis found. Patient states that her pain is worse with any ROM. NO FEVER

## 2018-09-22 NOTE — Discharge Instructions (Signed)
Take tylenol as needed for pain, take carafate for abdominal discomfort.  Call and follow up closely with GI specialist for further evaluation.  There is a chance that your symptoms may be due to COVID-19.  However there are no cure for COVID-19 at this time.  Therefore, always wear mask, wash your hands regularly and self quarantine until your symptoms completely resolved.

## 2018-10-01 ENCOUNTER — Encounter (INDEPENDENT_AMBULATORY_CARE_PROVIDER_SITE_OTHER): Payer: Medicare Other | Admitting: Ophthalmology

## 2018-10-01 ENCOUNTER — Other Ambulatory Visit: Payer: Self-pay

## 2018-10-01 DIAGNOSIS — H43813 Vitreous degeneration, bilateral: Secondary | ICD-10-CM | POA: Diagnosis not present

## 2018-10-01 DIAGNOSIS — H33303 Unspecified retinal break, bilateral: Secondary | ICD-10-CM

## 2018-10-01 DIAGNOSIS — H35413 Lattice degeneration of retina, bilateral: Secondary | ICD-10-CM

## 2018-10-01 DIAGNOSIS — H43312 Vitreous membranes and strands, left eye: Secondary | ICD-10-CM

## 2018-10-01 DIAGNOSIS — H33021 Retinal detachment with multiple breaks, right eye: Secondary | ICD-10-CM | POA: Diagnosis not present

## 2018-10-01 DIAGNOSIS — H2512 Age-related nuclear cataract, left eye: Secondary | ICD-10-CM

## 2018-10-08 ENCOUNTER — Encounter (INDEPENDENT_AMBULATORY_CARE_PROVIDER_SITE_OTHER): Payer: Medicare Other | Admitting: Ophthalmology

## 2018-10-14 ENCOUNTER — Ambulatory Visit (INDEPENDENT_AMBULATORY_CARE_PROVIDER_SITE_OTHER): Payer: Medicare Other | Admitting: Gastroenterology

## 2018-10-14 ENCOUNTER — Encounter: Payer: Self-pay | Admitting: Gastroenterology

## 2018-10-14 ENCOUNTER — Ambulatory Visit: Payer: Medicare Other | Attending: Family Medicine | Admitting: Family Medicine

## 2018-10-14 ENCOUNTER — Other Ambulatory Visit: Payer: Self-pay

## 2018-10-14 VITALS — BP 119/70 | HR 80 | Temp 97.0°F | Ht 65.0 in | Wt 208.0 lb

## 2018-10-14 DIAGNOSIS — K219 Gastro-esophageal reflux disease without esophagitis: Secondary | ICD-10-CM

## 2018-10-14 DIAGNOSIS — G43809 Other migraine, not intractable, without status migrainosus: Secondary | ICD-10-CM | POA: Diagnosis not present

## 2018-10-14 DIAGNOSIS — K648 Other hemorrhoids: Secondary | ICD-10-CM

## 2018-10-14 DIAGNOSIS — R1084 Generalized abdominal pain: Secondary | ICD-10-CM

## 2018-10-14 DIAGNOSIS — R1013 Epigastric pain: Secondary | ICD-10-CM

## 2018-10-14 DIAGNOSIS — E785 Hyperlipidemia, unspecified: Secondary | ICD-10-CM | POA: Diagnosis not present

## 2018-10-14 DIAGNOSIS — K580 Irritable bowel syndrome with diarrhea: Secondary | ICD-10-CM

## 2018-10-14 DIAGNOSIS — Z79899 Other long term (current) drug therapy: Secondary | ICD-10-CM

## 2018-10-14 DIAGNOSIS — Z09 Encounter for follow-up examination after completed treatment for conditions other than malignant neoplasm: Secondary | ICD-10-CM

## 2018-10-14 DIAGNOSIS — Z8674 Personal history of sudden cardiac arrest: Secondary | ICD-10-CM

## 2018-10-14 DIAGNOSIS — J4520 Mild intermittent asthma, uncomplicated: Secondary | ICD-10-CM

## 2018-10-14 DIAGNOSIS — G8929 Other chronic pain: Secondary | ICD-10-CM

## 2018-10-14 DIAGNOSIS — R7303 Prediabetes: Secondary | ICD-10-CM | POA: Diagnosis not present

## 2018-10-14 DIAGNOSIS — E669 Obesity, unspecified: Secondary | ICD-10-CM

## 2018-10-14 MED ORDER — OMEPRAZOLE 40 MG PO CPDR: 40.0000 mg | DELAYED_RELEASE_CAPSULE | Freq: Every day | ORAL | 1 refills | Status: DC

## 2018-10-14 MED ORDER — DICYCLOMINE HCL 10 MG PO CAPS: 10.0000 mg | ORAL_CAPSULE | Freq: Three times a day (TID) | ORAL | 2 refills | Status: DC

## 2018-10-14 NOTE — Patient Instructions (Signed)
If you are age 43 or older, your body mass index should be between 23-30. Your Body mass index is 34.61 kg/m. If this is out of the aforementioned range listed, please consider follow up with your Primary Care Provider.  If you are age 27 or younger, your body mass index should be between 19-25. Your Body mass index is 34.61 kg/m. If this is out of the aformentioned range listed, please consider follow up with your Primary Care Provider.   You have been scheduled for an endoscopy. Please follow written instructions given to you at your visit today. If you use inhalers (even only as needed), please bring them with you on the day of your procedure.  Due to recent COVID-19 restrictions implemented by our local and state authorities and in an effort to keep both patients and staff as safe as possible, our hospital system now requires COVID-19 testing prior to any scheduled hospital procedure. Please go to our [facility] location drive thru testing site ([address] [place], [place] [address]) on 11-06-2018, any time between 9:30am - 3 pm (Wednesday hours are 9:30 am-12 pm and Saturday hours are 9 am-12:30 pm). There will be multiple testing areas, the first checkpoint being for pre-procedure/surgery testing. Get into the right (yellow) lane that leads to the PAT testing team. You will not be billed at the time of testing but may receive a bill later depending on your insurance. The approximate cost of the test is $100. You must agree to quarantine from the time of your testing until the procedure date on 11-10-2018 . This should include staying at home with ONLY the people you live with. Avoid take-out, grocery store shopping or leaving the house for any non-emergent reason. Please call our office at [phone] if you have any questions.   It was a pleasure to see you today!  Dr. Loletha Carrow

## 2018-10-14 NOTE — Progress Notes (Signed)
Patient verified DOB Patient has eaten and taken medication today.

## 2018-10-14 NOTE — Progress Notes (Signed)
Virtual Visit via Telephone Note  I connected with Marisa [name] 10/14/18 at  2:30 PM EDT by telephone and verified that I am speaking with the correct person using two identifiers.   I discussed the limitations, risks, security and privacy concerns of performing an evaluation and management service by telephone and the availability of in person appointments. I also discussed with the patient that there may be a patient responsible charge related to this service. The patient expressed understanding and agreed to proceed.  Patient Location: Home Provider Location: Office at [place] Others participating in call: Call initiated by [place] who then transferred the call to me   History of Present Illness:       43 year old female who was last seen in the office on 12/29/2017 in follow-up of a history of prediabetes/type 2 diabetes  Patient with normal hemoglobin A1c at that time of 5.4 after losing weight and had hemoglobin A1c of 5.9 on 04/08/2017.  Since her last office visit, patient has had multiple emergency department visits with most recent emergency department visit on 09/22/2018 and patient was diagnosed with epigastric pain and prescribed Carafate and patient has upcoming GI appointment.          Today, patient with request for several referrals.  Patient with complaint of regaining some of the weight that she previously lost and she would like to have a referral to weight management program.  Patient also needs refill of metformin for treatment of prediabetes and patient needs refill of albuterol nebulizer solution for treatment of asthma and patient has complaint of the need for new nebulizer.  She denies any recent asthma flareups.  She denies any nighttime awakening with shortness of breath, cough or wheezing.  She is not avoiding any activities that of the fear of an asthma exacerbation.  She reports no current issues with urinary frequency, no increased thirst and no blurred vision  related to her control of her blood sugars.  She does feel that her blood sugars are likely not as well-controlled since she has gained weight.           Patient reports that her epigastric discomfort has improved with the use of sucralfate/Carafate however she is concerned that the substernal chest pain that she has been experiencing might be cardiac in nature.  She does report a history of cardiac arrest during childbirth.  Patient would like to be seen by cardiology.  Additionally, patient continues to have issues with migraine headaches with light and noise sensitivity and occasional nausea during headaches and would like to see neurology for further evaluation.  She reports an upcoming GI appointment in follow-up of her epigastric discomfort and nausea for which she went to the emergency department.  She does need a refill of omeprazole for treatment of acid reflux.  She has had no additional chest pain since her ED visit earlier this month.  She denies any current shortness of breath or cough.  She has had no recent fever or chills.  She continues to remain anxious regarding possibility of contracting COVID.  She denies any suicidal thoughts or ideations.  She denies any lower extremity edema, no numbness or tingling in her hands or feet.  She denies any dizziness but has migraine type headaches.  She denies any sore throat or difficulty swallowing.  No dysuria.  She does have some chronic pain in her feet due to plantar fasciitis for which she is followed by podiatry.  Past Medical History:  Diagnosis  Date   Allergy    Anal pain    chronic   Anemia    Anxiety    Asthma    exacerbation 02-28-2014 and 02-23-2014 secondary to Rhinovirus   Chronic headaches    Chronic low back pain    Cyst of right ovary    Difficult intravenous access    PER PT NEEDS PICC LINE   Gait instability    GERD (gastroesophageal reflux disease)    History of adenomatous polyp of colon    History of  cardiac arrest    during SVD 1992   History of ectopic pregnancy    2009-  S/P LEFT SALPINGECTOMY   History of panic attacks    IBS (irritable bowel syndrome)    Lumbar stenosis L4 -- L5 with bulging disk   w/ right leg weakness/ decreased mobility   Mild obstructive sleep apnea    study 03-20-2014  no cpap recommended   Neuromuscular disorder (HCC)    neuropathy in feet    Sleep apnea    mild no cpap   Type 2 diabetes mellitus (HCC)    Weakness of right leg    FROM BACK PROBLEM PER PT    Past Surgical History:  Procedure Laterality Date   ABDOMINAL HYSTERECTOMY     COLONOSCOPY Left 04/29/2013   Procedure: COLONOSCOPY;  Surgeon: Arta Silence, MD;  Location: [facility];  Service: Endoscopy;  Laterality: Left;   COLONOSCOPY     COLONOSCOPY WITH PROPOFOL N/A 08/01/2016   Procedure: COLONOSCOPY WITH PROPOFOL;  Surgeon: Doran Stabler, MD;  Location: [facility];  Service: Gastroenterology;  Laterality: N/A;   EVALUATION UNDER ANESTHESIA WITH FISTULECTOMY N/A 04/20/2014   Procedure: EXAM UNDER ANESTHESIA ;  Surgeon: Leighton Ruff, MD;  Location[facility];  Service: General;  Laterality: N/A;   FLEXIBLE SIGMOIDOSCOPY N/A 11/09/2013   Procedure: FLEXIBLE SIGMOIDOSCOPY;  Surgeon: Arta Silence, MD;  Location: [facility];  Service: Endoscopy;  Laterality: N/A;   fupa removal      LAPAROSCOPIC CHOLECYSTECTOMY  2005   SPHINCTEROTOMY N/A 04/20/2014   Procedure:  LATERAL INTERNAL SPHINCTEROTOMY;  Surgeon: Leighton Ruff, MD;  Location: [facility];  Service: General;  Laterality: N/A;   TRANSTHORACIC ECHOCARDIOGRAM  12-30-2012   mild LVH/  ef 55-60%   UNILATERAL SALPINGECTOMY  2009   laparotomy left salpingectomy-- ectopic preg.   UPPER GASTROINTESTINAL ENDOSCOPY     VAGINAL HYSTERECTOMY N/A 01/06/2013   Procedure: HYSTERECTOMY VAGINAL;  Surgeon: Osborne Oman, MD;  Location: [facility];  Service: Gynecology;  Laterality: N/A;      Family History  Problem Relation Age of Onset   Hypertension Mother    Diabetes Mother    Allergies Mother    Heart disease Mother    Clotting disorder Mother    Cancer Father    Hyperlipidemia Father    Hypertension Father    Colon cancer Father    Heart disease Maternal Grandmother    Breast cancer Maternal Grandmother 2   Schizophrenia Sister    Bipolar disorder Sister    Clotting disorder Sister    Bipolar disorder Brother    Kidney disease Brother    Bipolar disorder Sister    Pancreatic cancer Maternal Aunt    Prostate cancer Maternal Uncle    Liver cancer Maternal Grandfather    Rectal cancer Maternal Grandfather    Liver cancer Paternal Grandfather    Colon polyps Neg Hx    Esophageal cancer Neg Hx  Stomach cancer Neg Hx     Social History   Tobacco Use   Smoking status: Former Smoker    Packs/day: 0.20    Years: 11.00    Pack years: 2.20    Types: Cigarettes    Quit date: 11/17/2013    Years since quitting: 4.9   Smokeless tobacco: Never Used   Tobacco comment: 2 years quit  Substance Use Topics   Alcohol use: No    Alcohol/week: 0.0 standard drinks   Drug use: No     Allergies  Allergen Reactions   Asa [Aspirin] Anaphylaxis    Hives, chest tightness    Mushroom Extract Complex Anaphylaxis, Swelling and Other (See Comments)    Reaction:  Eye swelling   Penicillins Anaphylaxis and Other (See Comments)    Has patient had a PCN reaction causing immediate rash, facial/tongue/throat swelling, SOB or lightheadedness with hypotension: Yes Has patient had a PCN reaction causing severe rash involving mucus membranes or skin necrosis: No Has patient had a PCN reaction that required hospitalization No Has patient had a PCN reaction occurring within the last 10 years: No If all of the above answers are "NO", then may proceed with Cephalosporin use.   Shellfish Allergy Anaphylaxis   Triamcinolone Other (See Comments)     Skin issues        Observations/Objective: No vital signs or physical exam conducted as visit was done via telephone  Assessment and Plan: 1. Migraine variant with headache Patient is currently taking Excedrin to help with her headaches.  She will be referred to neurology for further evaluation and treatment.  Patient has been asked to come into the office for CMP in follow-up of medication use as well as other chronic medical conditions. - Ambulatory referral to Neurology - Comprehensive metabolic panel; Future  2. Mild intermittent asthma without complication Patient with mild intermittent asthma and request albuterol nebulizer as well as refill of albuterol nebulizer solution.  Patient encouraged to take her Dulera 2 puffs 2 times daily instead of every other day as she has been doing.  Patient will pick up nebulizer when she comes into the office for lab work. - albuterol (VENTOLIN HFA) 108 (90 Base) MCG/ACT inhaler; Inhale 1-2 puffs into the lungs every 6 (six) hours as needed for wheezing or shortness of breath.  Dispense: 18 g; Refill: 5  3. Prediabetes Patient with complaint of weight gain and believes that her blood sugars may now be higher.  Patient requests referral to medical weight management which will be placed.  Patient is also asked to come into the office to have lipid panel, hemoglobin A1c and comprehensive metabolic panel in follow-up of prediabetes.  Patient also provided with refill of metformin.  Low carbohydrate diet and exercise encouraged. - Amb Ref to Medical Weight Management - Lipid panel; Future - Hemoglobin A1c; Future - Comprehensive metabolic panel; Future  4. Hyperlipidemia, unspecified hyperlipidemia type Patient has been asked to come into the office for fasting lipid panel in follow-up of hyperlipidemia for which she is currently on fenofibrate.  Patient has been reluctant in the past to take statin medication.  Patient's last lipid panel with total  cholesterol of 217, triglycerides 150 and LDL elevated at 131 which was done on 12/29/2017.  Patient declined statin therapy when notified of the results.  5. History of cardiac arrest Patient with history of cardiac arrest during childbirth and has increased cardiac risk factors including obesity, hyperlipidemia for which she refuses to take statin therapy,  and history of possible type 2 diabetes versus prediabetes. - Ambulatory referral to Cardiology  6. Obesity (BMI 30-39.9) Patient with obesity with a BMI of 34 per ED notes from 09/16/2018.  Weight at that time was 94.3 kg with a height of 5 feet 5 inches.  Patient will be referred to medical weight management program.  Patient will have lipid panel and comprehensive metabolic panel. - Amb Ref to Medical Weight Management - Lipid panel; Future - Comprehensive metabolic panel; Future  7. Encounter for examination following treatment at hospital; GERD Patient's is being seen in follow-up of her ED visit on 09/22/2018.  Patient reports improvement in epigastric pain with use of Carafate and she has been provided with new prescription for omeprazole.  She has been asked to make sure that she avoids nonsteroidal anti-inflammatories/aspirin as well as avoidance of late night eating and avoidance of spicy/greasy foods.  Keep scheduled follow-up with GI.  8. Encounter for long-term (current) use of medications Patient is on multiple medications and has history of multiple medical issues including prediabetes, chronic pain, migraines and asthma and will have lab visit for CMP in follow-up of medication use. - Comprehensive metabolic panel; Future  Follow Up Instructions:Return in about 3 months (around 01/14/2019) for Chronic issues-3 months and sooner if warranted based on labs or any concerns.    I discussed the assessment and treatment plan with the patient. The patient was provided an opportunity to ask questions and all were answered. The patient  agreed with the plan and demonstrated an understanding of the instructions.   The patient was advised to call back or seek an in-person evaluation if the symptoms worsen or if the condition fails to improve as anticipated.  I provided 21 minutes of non-face-to-face time during this encounter.   Antony Blackbird, MD

## 2018-10-14 NOTE — Progress Notes (Signed)
[facility] Progress Note  Chief Complaint: Upper abdominal pain  Subjective  History: Last seen June 2018 for colonoscopy, shortly after an office visit for chronic abdominal bloating and postprandial diarrhea with a history of anal fissure requiring surgical therapy.  Previous GI evaluation by Dr. Paulita Fujita (who also found an adenomatous polyp), surgery by Dr. Marcello Moores of [facility].  Colonoscopy with me (done in the hospital endoscopy lab due to reported difficulty with IV access) on 08/01/2016 revealed internal hemorrhoids and 2 subcentimeter adenomatous polyps in the transverse and sigmoid.  Seen in the emergency department 07/28/2018 with chest pain fever and body aches with close contact COVID exposure.  COVID test negative.  ED 09/16/2018 for shortness of breath, wheezing, cough and abdominal pain.  Abdominal pain was described as epigastric with reported black stool, with NSAID use.  Rectal exam was reportedly negative for blood.  Troponin negative.  CT scan chest without PE pneumonia or pneumothorax.  CT abdomen negative for any acute pathology.  Treated with PPI and Carafate.  Patient again in the ED on 09/22/2018 with chest and epigastric pain.  Provider note indicates tenderness to anterior chest wall and epigastrium.  Patient reported fever of 104 several days prior to the visit, but was afebrile during the ED evaluation.   Marisa [name] reports that for over a year she has had episodic chest and epigastric discomfort, often with shortness of breath and feelings of anxiety.  She has overnight and early morning episodes of regurgitation and water brash.  There is generalized abdominal pain and bloating after meals, and she still has postprandial urgency and often loose nonbloody BMs.  At times she feels constipated, meaning that she will have the urge for bowel movement after meals but nothing will come out.  She would then sit for prolonged periods and often strain, and then may have bleeding.   The bleeding occurs about every other week. She denies dysphagia odynophagia, but sometimes has nausea and vomits black material that she believes may be blood.  ROS: Cardiovascular:  no chest pain Respiratory: no dyspnea Shortness of breath and wheezing from asthma Anxiety Remainder of systems negative except as above The patient's Past Medical, Family and Social History were reviewed and are on file in the EMR.  Objective:  Med list reviewed  Current Outpatient Medications:  .  albuterol (PROVENTIL HFA;VENTOLIN HFA) 108 (90 Base) MCG/ACT inhaler, Inhale 1-2 puffs into the lungs every 6 (six) hours as needed for wheezing or shortness of breath., Disp: 1 Inhaler, Rfl: 3 .  cyclobenzaprine (FLEXERIL) 10 MG tablet, Take 10 mg by mouth daily., Disp: , Rfl:  .  gabapentin (NEURONTIN) 300 MG capsule, Take 1 capsule (300 mg total) by mouth 3 (three) times daily. (Patient taking differently: Take 300 mg by mouth daily. ), Disp: 270 capsule, Rfl: 1 .  metFORMIN (GLUCOPHAGE) 500 MG tablet, Take by mouth 2 (two) times daily with a meal., Disp: , Rfl:  .  mometasone-formoterol (DULERA) 100-5 MCG/ACT AERO, Inhale 2 puffs into the lungs 2 (two) times daily. (Patient taking differently: Inhale 2 puffs into the lungs every other day. ), Disp: 1 Inhaler, Rfl: 3 .  ondansetron (ZOFRAN ODT) 4 MG disintegrating tablet, Take 1 tablet (4 mg total) by mouth every 8 (eight) hours as needed for nausea or vomiting., Disp: 12 tablet, Rfl: 1 .  sucralfate (CARAFATE) 1 g tablet, Take 1 tablet (1 g total) by mouth 4 (four) times daily -  with meals and at bedtime., Disp:  30 tablet, Rfl: 0 .  dicyclomine (BENTYL) 10 MG capsule, Take 1 capsule (10 mg total) by mouth 3 (three) times daily before meals., Disp: 90 capsule, Rfl: 2 .  omeprazole (PRILOSEC) 40 MG capsule, Take 1 capsule (40 mg total) by mouth daily., Disp: 30 capsule, Rfl: 1   Vital signs in last 24 hrs: Vitals:   10/14/18 0834  BP: 119/70  Pulse: 80   Temp: (!) 97 F (36.1 C)    Physical Exam  Well-appearing, pleasant and conversational  HEENT: sclera anicteric, oral mucosa moist without lesions  Neck: supple, no thyromegaly, JVD or lymphadenopathy  Cardiac: RRR without murmurs, S1S2 heard, no peripheral edema  Pulm: clear to auscultation bilaterally, normal RR and effort noted  Abdomen: soft, generalized tenderness to light palpation of abdominal wall, with active bowel sounds. No guarding or palpable hepatosplenomegaly.  Skin; warm and dry, no jaundice or rash  Recent Labs:  CBC Latest Ref Rng & Units 09/22/2018 09/16/2018 07/28/2018  WBC 4.0 - 10.5 K/uL 6.8 8.8 5.5  Hemoglobin 12.0 - 15.0 g/dL 13.1 13.7 14.2  Hematocrit 36.0 - 46.0 % 40.1 41.2 42.8  Platelets 150 - 400 K/uL 377 348 387   CMP Latest Ref Rng & Units 09/22/2018 09/16/2018 07/28/2018  Glucose 70 - 99 mg/dL 99 102(H) 101(H)  BUN 6 - 20 mg/dL 8 14 10   Creatinine 0.44 - 1.00 mg/dL 0.59 0.63 0.60  Sodium 135 - 145 mmol/L 137 136 143  Potassium 3.5 - 5.1 mmol/L 3.8 4.0 4.1  Chloride 98 - 111 mmol/L 106 106 109  CO2 22 - 32 mmol/L 20(L) 19(L) 20(L)  Calcium 8.9 - 10.3 mg/dL 9.2 9.0 8.9  Total Protein 6.5 - 8.1 g/dL - 7.1 6.7  Total Bilirubin 0.3 - 1.2 mg/dL - 1.1 0.5  Alkaline Phos 38 - 126 U/L - 55 59  AST 15 - 41 U/L - 17 20  ALT 0 - 44 U/L - 19 20   Multiple negative troponins on 09/16/2018 and 09/22/2018.    Radiologic studies:  CLINICAL DATA:  Chest pain and bloody stool   EXAM: CT ANGIOGRAPHY CHEST   CT ABDOMEN AND PELVIS WITH CONTRAST   TECHNIQUE: Multidetector CT imaging of the chest was performed using the standard protocol during bolus administration of intravenous contrast. Multiplanar CT image reconstructions and MIPs were obtained to evaluate the vascular anatomy. Multidetector CT imaging of the abdomen and pelvis was performed using the standard protocol during bolus administration of intravenous contrast.   CONTRAST:  163mL OMNIPAQUE  IOHEXOL 350 MG/ML SOLN   COMPARISON:  CT chest 03/10/2017, CT abdomen pelvis 06/11/2016   FINDINGS: CTA CHEST FINDINGS   Cardiovascular: Satisfactory opacification of the pulmonary arteries to the segmental level. No evidence of pulmonary embolism. Borderline cardiomegaly. No pericardial effusion. Nonaneurysmal aorta. No dissection.   Mediastinum/Nodes: No enlarged mediastinal, hilar, or axillary lymph nodes. Midline trachea. Esophagus within normal limits. Subcentimeter hypodensity left lobe of thyroid, no change.   Lungs/Pleura: Lungs are clear. No pleural effusion or pneumothorax.   Musculoskeletal: No chest wall abnormality. No acute or significant osseous findings.   Review of the MIP images confirms the above findings.   CT ABDOMEN and PELVIS FINDINGS   Hepatobiliary: Hepatic steatosis. Clips at the gallbladder fossa. No biliary dilatation   Pancreas: Unremarkable. No pancreatic ductal dilatation or surrounding inflammatory changes.   Spleen: Normal in size without focal abnormality.   Adrenals/Urinary Tract: Adrenal glands are unremarkable. Kidneys are normal, without renal calculi, focal lesion, or hydronephrosis.  Bladder is unremarkable.   Stomach/Bowel: Stomach is within normal limits. Appendix appears normal. No evidence of bowel wall thickening, distention, or inflammatory changes.   Vascular/Lymphatic: No significant vascular findings are present. No enlarged abdominal or pelvic lymph nodes.   Reproductive: Status post hysterectomy. No adnexal masses.   Other: Negative for free air or free fluid   Musculoskeletal: No acute or significant osseous findings.   Review of the MIP images confirms the above findings.   IMPRESSION: 1. Negative for acute pulmonary embolus or aortic dissection. Clear lung fields. 2. No CT evidence for acute intra-abdominal or pelvic abnormality 3. Mild hepatic steatosis     Electronically Signed   By: Donavan Foil M.D.    On: 09/17/2018 01:25   (Images personally reviewed)  @[email or web address] @ Assessment: Encounter Diagnoses  Name Primary?  . Abdominal pain, chronic, epigastric Yes  . Gastroesophageal reflux disease, esophagitis presence not specified   . Generalized abdominal pain   . Internal hemorrhoid, bleeding   . Irritable bowel syndrome with diarrhea     Upper abdominal pain and chest pain along with shortness of breath and feelings of anxiety in the context of generalized abdominal discomfort postprandial bloating and diarrhea still consistent with IBS.  Although she has episodic symptoms of GERD with regurgitation, her chest pain is described as both burning and sharp and stabbing in it is often unrelated to meals position or time of day.  While she has some symptoms of GERD, does not seem to me that would fully account for this recurrent chest pain.  Her exam and symptoms suggest a giving element of underlying functional bowel disorder, and I suspect there may be an anxiety overlay to symptoms. The rectal bleeding is hemorrhoidal based on description and previous colonoscopy findings.  It seems to be exacerbated by sitting for long periods and straining while toileting.  Feelings of constipation sound like IBS related rectal spasm.  Plan: Omeprazole 40 mg once daily Discontinue sucralfate Dicyclomine 10 mg 3 times daily before meals  Upper endoscopy.  Must be done in the hospital outpatient endoscopy lab due to history of difficult IV access.  She is agreeable after discussion of procedure and risks.  The benefits and risks of the planned procedure were described in detail with the patient or (when appropriate) their health care proxy.  Risks were outlined as including, but not limited to, bleeding, infection, perforation, adverse medication reaction leading to cardiac or pulmonary decompensation.  The limitation of incomplete mucosal visualization was also discussed.  No guarantees or  warranties were given.  She is seen primary care later today, will reestablish care for asthma and other medical issues.  Screening for, and possible treatment of, underlying anxiety seems to be in order.  Total time 40 minutes, over half spent face-to-face with patient in counseling and coordination of care.   [name]

## 2018-10-20 ENCOUNTER — Ambulatory Visit: Payer: Medicare Other | Attending: Family Medicine

## 2018-10-20 ENCOUNTER — Ambulatory Visit (INDEPENDENT_AMBULATORY_CARE_PROVIDER_SITE_OTHER): Payer: Medicare Other

## 2018-10-20 ENCOUNTER — Ambulatory Visit (INDEPENDENT_AMBULATORY_CARE_PROVIDER_SITE_OTHER): Payer: Medicare Other | Admitting: Podiatry

## 2018-10-20 ENCOUNTER — Other Ambulatory Visit: Payer: Self-pay

## 2018-10-20 ENCOUNTER — Telehealth: Payer: Self-pay | Admitting: *Deleted

## 2018-10-20 ENCOUNTER — Encounter: Payer: Self-pay | Admitting: Podiatry

## 2018-10-20 DIAGNOSIS — M79671 Pain in right foot: Secondary | ICD-10-CM

## 2018-10-20 DIAGNOSIS — R7303 Prediabetes: Secondary | ICD-10-CM

## 2018-10-20 DIAGNOSIS — M79672 Pain in left foot: Secondary | ICD-10-CM

## 2018-10-20 DIAGNOSIS — Z79899 Other long term (current) drug therapy: Secondary | ICD-10-CM

## 2018-10-20 DIAGNOSIS — M722 Plantar fascial fibromatosis: Secondary | ICD-10-CM | POA: Diagnosis not present

## 2018-10-20 DIAGNOSIS — E669 Obesity, unspecified: Secondary | ICD-10-CM

## 2018-10-20 DIAGNOSIS — G43809 Other migraine, not intractable, without status migrainosus: Secondary | ICD-10-CM

## 2018-10-20 MED ORDER — ALBUTEROL SULFATE HFA 108 (90 BASE) MCG/ACT IN AERS: 1.0000 | INHALATION_SPRAY | Freq: Four times a day (QID) | RESPIRATORY_TRACT | 5 refills | Status: DC | PRN

## 2018-10-20 MED ORDER — OMEPRAZOLE 40 MG PO CPDR: 40.0000 mg | DELAYED_RELEASE_CAPSULE | Freq: Every day | ORAL | 1 refills | Status: DC

## 2018-10-20 MED ORDER — METFORMIN HCL 500 MG PO TABS: 500.0000 mg | ORAL_TABLET | Freq: Two times a day (BID) | ORAL | 5 refills | Status: DC

## 2018-10-20 MED ORDER — ALBUTEROL SULFATE (2.5 MG/3ML) 0.083% IN NEBU: 2.5000 mg | INHALATION_SOLUTION | Freq: Four times a day (QID) | RESPIRATORY_TRACT | 5 refills | Status: DC | PRN

## 2018-10-20 NOTE — Telephone Encounter (Signed)
Spoke with patient and she stated she is needing her medications refilled.  Per pt she is needing refills for: Metformin, Dulera, Albuterol, Cholesterol Medicine(it's not listed on current medication list), a new order for her Nebulizer Machine because her current one is broken and some solutions to go with her [employer].   Patient is also reminding staff to remind provider to please put in her referral to Cardiology, weight loss program and Neurology office.

## 2018-10-20 NOTE — Patient Instructions (Signed)
Pre-Operative Instructions  Congratulations, you have decided to take an important step towards improving your quality of life.  You can be assured that the doctors and staff at [facility] will be with you every step of the way.  Here are some important things you should know:  1. Plan to be at the surgery center/hospital at least 1 (one) hour prior to your scheduled time, unless otherwise directed by the surgical center/hospital staff.  You must have a responsible adult accompany you, remain during the surgery and drive you home.  Make sure you have directions to the surgical center/hospital to ensure you arrive on time. 2. If you are having surgery at [facility] or [facility], you will need a copy of your medical history and physical form from your family physician within one month prior to the date of surgery. We will give you a form for your primary physician to complete.  3. We make every effort to accommodate the date you request for surgery.  However, there are times where surgery dates or times have to be moved.  We will contact you as soon as possible if a change in schedule is required.   4. No aspirin/ibuprofen for one week before surgery.  If you are on aspirin, any non-steroidal anti-inflammatory medications (Mobic, Aleve, Ibuprofen) should not be taken seven (7) days prior to your surgery.  You make take Tylenol for pain prior to surgery.  5. Medications - If you are taking daily heart and blood pressure medications, seizure, reflux, allergy, asthma, anxiety, pain or diabetes medications, make sure you notify the surgery center/hospital before the day of surgery so they can tell you which medications you should take or avoid the day of surgery. 6. No food or drink after midnight the night before surgery unless directed otherwise by surgical center/hospital staff. 7. No alcoholic beverages 24-hours prior to surgery.  No smoking 24-hours prior or 24-hours after  surgery. 8. Wear loose pants or shorts. They should be loose enough to fit over bandages, boots, and casts. 9. Don't wear slip-on shoes. Sneakers are preferred. 10. Bring your boot with you to the surgery center/hospital.  Also bring crutches or a walker if your physician has prescribed it for you.  If you do not have this equipment, it will be provided for you after surgery. 11. If you have not been contacted by the surgery center/hospital by the day before your surgery, call to confirm the date and time of your surgery. 12. Leave-time from work may vary depending on the type of surgery you have.  Appropriate arrangements should be made prior to surgery with your employer. 13. Prescriptions will be provided immediately following surgery by your doctor.  Fill these as soon as possible after surgery and take the medication as directed. Pain medications will not be refilled on weekends and must be approved by the doctor. 14. Remove nail polish on the operative foot and avoid getting pedicures prior to surgery. 15. Wash the night before surgery.  The night before surgery wash the foot and leg well with water and the antibacterial soap provided. Be sure to pay special attention to beneath the toenails and in between the toes.  Wash for at least three (3) minutes. Rinse thoroughly with water and dry well with a towel.  Perform this wash unless told not to do so by your physician.  Enclosed: 1 Ice pack (please put in freezer the night before surgery)   1 Hibiclens skin cleaner     Pre-op instructions  If you have any questions regarding the instructions, please do not hesitate to call our office.  [place]: [address], [place], [place] [address] -- [phone]  [place]: [address] [place], [place] [address] -- [phone]  [place]: [address]  [place], [place] [address] -- [phone]  [place]: [address], [address], [place],  [address] -- [phone]  Website:  [email or web address] 

## 2018-10-21 ENCOUNTER — Other Ambulatory Visit: Payer: Self-pay | Admitting: Pharmacist

## 2018-10-21 ENCOUNTER — Other Ambulatory Visit: Payer: Self-pay | Admitting: Family Medicine

## 2018-10-21 DIAGNOSIS — E781 Pure hyperglyceridemia: Secondary | ICD-10-CM

## 2018-10-21 LAB — COMPREHENSIVE METABOLIC PANEL WITH GFR
ALT: 14 IU/L (ref 0–32)
AST: 15 IU/L (ref 0–40)
Albumin/Globulin Ratio: 1.7 (ref 1.2–2.2)
Albumin: 4.4 g/dL (ref 3.8–4.8)
Alkaline Phosphatase: 70 IU/L (ref 39–117)
BUN/Creatinine Ratio: 14 (ref 9–23)
BUN: 10 mg/dL (ref 6–24)
Bilirubin Total: 0.4 mg/dL (ref 0.0–1.2)
CO2: 24 mmol/L (ref 20–29)
Calcium: 9.5 mg/dL (ref 8.7–10.2)
Chloride: 102 mmol/L (ref 96–106)
Creatinine, Ser: 0.71 mg/dL (ref 0.57–1.00)
GFR calc Af Amer: 120 mL/min/1.73
GFR calc non Af Amer: 104 mL/min/1.73
Globulin, Total: 2.6 g/dL (ref 1.5–4.5)
Glucose: 103 mg/dL — ABNORMAL HIGH (ref 65–99)
Potassium: 4.2 mmol/L (ref 3.5–5.2)
Sodium: 139 mmol/L (ref 134–144)
Total Protein: 7 g/dL (ref 6.0–8.5)

## 2018-10-21 LAB — LIPID PANEL
Chol/HDL Ratio: 4.6 ratio — ABNORMAL HIGH (ref 0.0–4.4)
Cholesterol, Total: 198 mg/dL (ref 100–199)
HDL: 43 mg/dL
LDL Chol Calc (NIH): 95 mg/dL (ref 0–99)
Triglycerides: 358 mg/dL — ABNORMAL HIGH (ref 0–149)
VLDL Cholesterol Cal: 60 mg/dL — ABNORMAL HIGH (ref 5–40)

## 2018-10-21 LAB — HEMOGLOBIN A1C
Est. average glucose Bld gHb Est-mCnc: 111 mg/dL
Hgb A1c MFr Bld: 5.5 % (ref 4.8–5.6)

## 2018-10-21 MED ORDER — FENOFIBRATE 48 MG PO TABS: 48.0000 mg | ORAL_TABLET | Freq: Every day | ORAL | 1 refills | Status: DC

## 2018-10-21 MED ORDER — ALBUTEROL SULFATE HFA 108 (90 BASE) MCG/ACT IN AERS: 1.0000 | INHALATION_SPRAY | Freq: Four times a day (QID) | RESPIRATORY_TRACT | 2 refills | Status: DC | PRN

## 2018-10-21 NOTE — Progress Notes (Signed)
Subjective:   Patient ID: Marisa [name], female   DOB: 43 y.o.   MRN: [id number]   HPI Patient continues to experience severe discomfort in the plantar heel and states that she had minimal reduction of her pain at [name] visit.  States that she is looking for more permanent solution as it is becoming lifestyle limiting and she is in pain at all times   ROS      Objective:  Physical Exam  Neurovascular status intact negative Homans sign noted exquisite discomfort noted plantar fascial band right over left with inflammation fluid of the medial band at its insertion into the calcaneus     Assessment:  Chronic plantar fasciitis right over left does not respond to numerous conservative treatments     Plan:  H&P conditions reviewed and I did discuss treatment options and she is interested in surgical intervention.  I discussed endoscopic procedure explained procedure risk and the fact there is no long-term guarantees and patient wants procedure understanding risks as outlined.  She is scheduled for outpatient surgery for endoscopic release medial band she understands no guarantee as far as the success of the procedure and everything is outlined in the consent form.  Patient is willing to undergo surgery and is scheduled for outpatient procedure and does understand also she may develop arch pain or other pain secondary to release of the fascia.  I dispensed air fracture walker and she understands total recovery can take 6 months to 1 year and signed consent form

## 2018-10-21 NOTE — Progress Notes (Signed)
Patient ID: Marisa [name], female   DOB: [birth year], 43 y.o.   MRN: [id number]   Patient with elevated TG and normal LFT's on recent labs- new RX will be sent in for generic tricor

## 2018-10-25 NOTE — Telephone Encounter (Signed)
I am on vacation. Please route to her PCP

## 2018-10-26 ENCOUNTER — Encounter: Payer: Self-pay | Admitting: Family Medicine

## 2018-10-26 NOTE — Telephone Encounter (Signed)
These were all done last week, I am not sure why her message keeps getting transferred. If she does not have a nebulizer then she can come in to pick one up and please remind her to take her long term asthma control medication on a daily basis

## 2018-10-26 NOTE — Telephone Encounter (Signed)
All of these things have been done for the patient but I am not sure if she picked up a nebulizer when she came in to have her blood work done. Her medications have been sent in and her referrals were placed

## 2018-10-27 NOTE — Telephone Encounter (Signed)
noted 

## 2018-10-29 ENCOUNTER — Telehealth: Payer: Self-pay | Admitting: *Deleted

## 2018-10-29 NOTE — Telephone Encounter (Signed)
DOS 11/02/2018 ENDOSCOPIC PLANTAR FASCIOTOMY RT FOOT - 80063  UHC: Eligibility Date - 04/18/2018 - 02/17/2019  Individual, In-Network  Member's plan does not have a deductible.  Out-of-Pocket Maximum Per Service Yearinfo  $0.00 of $6,700.00 Met  Remaining: $6,700.00    [facility]  You pay $0.00 if you are enrolled in Medicaid as a Qualified Medicare Beneficiary (QMB).      This [employer] Medicare Advantage members plan does not currently require a prior authorization for these services. If you have general questions about the prior authorization requirements, please call us at [phone] or visit [email or web address] > Clinician Resources > Advance and Admission Notification Requirements. The number above acknowledges your notification. Please write this number down for future reference. Notification is not a guarantee of coverage or payment.  Decision ID #:[id number]

## 2018-11-02 ENCOUNTER — Encounter: Payer: Self-pay | Admitting: Podiatry

## 2018-11-02 ENCOUNTER — Telehealth: Payer: Self-pay | Admitting: Podiatry

## 2018-11-02 ENCOUNTER — Other Ambulatory Visit: Payer: Self-pay | Admitting: Podiatry

## 2018-11-02 DIAGNOSIS — M722 Plantar fascial fibromatosis: Secondary | ICD-10-CM | POA: Diagnosis not present

## 2018-11-02 MED ORDER — HYDROCODONE-ACETAMINOPHEN 10-325 MG PO TABS: 1.0000 | ORAL_TABLET | Freq: Four times a day (QID) | ORAL | 0 refills | Status: AC | PRN

## 2018-11-02 MED ORDER — PROMETHAZINE HCL 25 MG PO TABS: 25.0000 mg | ORAL_TABLET | Freq: Three times a day (TID) | ORAL | 0 refills | Status: DC | PRN

## 2018-11-02 NOTE — Telephone Encounter (Signed)
I called Dr. Paulla Dolly, he requested have doctor on call order hydrocodone acetaminophen 10/325mg  #20 one tablet every 6 to 8 hours prn foot pain.

## 2018-11-02 NOTE — Telephone Encounter (Signed)
Pt had surgery today and is being told that her medication needs to be sent over electronically.

## 2018-11-03 NOTE — Telephone Encounter (Signed)
Completed yesterday

## 2018-11-06 ENCOUNTER — Other Ambulatory Visit (HOSPITAL_COMMUNITY)
Admission: RE | Admit: 2018-11-06 | Discharge: 2018-11-06 | Disposition: A | Discharge status: Home / Self Care | Payer: Medicare Other | Source: Ambulatory Visit | Attending: Gastroenterology | Admitting: Gastroenterology

## 2018-11-06 ENCOUNTER — Other Ambulatory Visit (HOSPITAL_COMMUNITY): Payer: Medicare Other

## 2018-11-06 DIAGNOSIS — Z01812 Encounter for preprocedural laboratory examination: Secondary | ICD-10-CM | POA: Insufficient documentation

## 2018-11-06 DIAGNOSIS — Z20828 Contact with and (suspected) exposure to other viral communicable diseases: Secondary | ICD-10-CM | POA: Diagnosis not present

## 2018-11-07 LAB — NOVEL CORONAVIRUS, NAA (HOSP ORDER, SEND-OUT TO REF LAB; TAT 18-24 HRS): SARS-CoV-2, NAA: NOT DETECTED

## 2018-11-09 ENCOUNTER — Encounter (HOSPITAL_COMMUNITY): Payer: Self-pay | Admitting: *Deleted

## 2018-11-09 ENCOUNTER — Other Ambulatory Visit: Payer: Self-pay

## 2018-11-09 NOTE — Progress Notes (Signed)
Spoke with patient, has quarantined without fever or symptoms since covid 19 test.  Patient verbalizes understanding to be NPO after MN.  Answered questions regarding procedure.  Arrival time 1300.

## 2018-11-10 ENCOUNTER — Encounter (HOSPITAL_COMMUNITY)
Admission: RE | Disposition: A | Discharge status: Home / Self Care | Payer: Self-pay | Source: Home / Self Care | Attending: Gastroenterology

## 2018-11-10 ENCOUNTER — Other Ambulatory Visit: Payer: Self-pay

## 2018-11-10 ENCOUNTER — Encounter (HOSPITAL_COMMUNITY): Payer: Self-pay | Admitting: *Deleted

## 2018-11-10 ENCOUNTER — Ambulatory Visit (HOSPITAL_COMMUNITY): Payer: Medicare Other | Admitting: Anesthesiology

## 2018-11-10 ENCOUNTER — Ambulatory Visit (HOSPITAL_COMMUNITY)
Admission: RE | Admit: 2018-11-10 | Discharge: 2018-11-10 | Disposition: A | Discharge status: Home / Self Care | Payer: Medicare Other | Attending: Gastroenterology | Admitting: Gastroenterology

## 2018-11-10 ENCOUNTER — Encounter: Payer: Medicare Other | Admitting: Podiatry

## 2018-11-10 DIAGNOSIS — Z880 Allergy status to penicillin: Secondary | ICD-10-CM | POA: Diagnosis not present

## 2018-11-10 DIAGNOSIS — G4733 Obstructive sleep apnea (adult) (pediatric): Secondary | ICD-10-CM | POA: Insufficient documentation

## 2018-11-10 DIAGNOSIS — Z8674 Personal history of sudden cardiac arrest: Secondary | ICD-10-CM | POA: Insufficient documentation

## 2018-11-10 DIAGNOSIS — K219 Gastro-esophageal reflux disease without esophagitis: Secondary | ICD-10-CM | POA: Diagnosis not present

## 2018-11-10 DIAGNOSIS — R1013 Epigastric pain: Secondary | ICD-10-CM | POA: Diagnosis not present

## 2018-11-10 DIAGNOSIS — E1140 Type 2 diabetes mellitus with diabetic neuropathy, unspecified: Secondary | ICD-10-CM | POA: Insufficient documentation

## 2018-11-10 DIAGNOSIS — Z888 Allergy status to other drugs, medicaments and biological substances status: Secondary | ICD-10-CM | POA: Diagnosis not present

## 2018-11-10 DIAGNOSIS — R079 Chest pain, unspecified: Secondary | ICD-10-CM | POA: Diagnosis not present

## 2018-11-10 DIAGNOSIS — R1084 Generalized abdominal pain: Secondary | ICD-10-CM

## 2018-11-10 DIAGNOSIS — Z87891 Personal history of nicotine dependence: Secondary | ICD-10-CM | POA: Diagnosis not present

## 2018-11-10 DIAGNOSIS — Z886 Allergy status to analgesic agent status: Secondary | ICD-10-CM | POA: Insufficient documentation

## 2018-11-10 DIAGNOSIS — G8929 Other chronic pain: Secondary | ICD-10-CM

## 2018-11-10 DIAGNOSIS — K580 Irritable bowel syndrome with diarrhea: Secondary | ICD-10-CM

## 2018-11-10 DIAGNOSIS — J45909 Unspecified asthma, uncomplicated: Secondary | ICD-10-CM | POA: Insufficient documentation

## 2018-11-10 DIAGNOSIS — K648 Other hemorrhoids: Secondary | ICD-10-CM

## 2018-11-10 HISTORY — PX: ESOPHAGOGASTRODUODENOSCOPY (EGD) WITH PROPOFOL: SHX5813

## 2018-11-10 LAB — GLUCOSE, CAPILLARY: Glucose-Capillary: 85 mg/dL (ref 70–99)

## 2018-11-10 SURGERY — ESOPHAGOGASTRODUODENOSCOPY (EGD) WITH PROPOFOL
Anesthesia: Monitor Anesthesia Care

## 2018-11-10 MED ORDER — PROPOFOL 500 MG/50ML IV EMUL
INTRAVENOUS | Status: DC | PRN
  Administered 2018-11-10: 125 ug/kg/min via INTRAVENOUS

## 2018-11-10 MED ORDER — PROPOFOL 10 MG/ML IV BOLUS
INTRAVENOUS | Status: AC
  Filled 2018-11-10: qty 20

## 2018-11-10 MED ORDER — LACTATED RINGERS IV SOLN
INTRAVENOUS | Status: DC
  Administered 2018-11-10: 14:00:00 via INTRAVENOUS

## 2018-11-10 MED ORDER — MIDAZOLAM HCL 2 MG/2ML IJ SOLN
INTRAMUSCULAR | Status: AC
  Filled 2018-11-10: qty 2

## 2018-11-10 MED ORDER — MIDAZOLAM HCL 5 MG/5ML IJ SOLN
INTRAMUSCULAR | Status: DC | PRN
  Administered 2018-11-10: 2 mg via INTRAVENOUS

## 2018-11-10 MED ORDER — SODIUM CHLORIDE 0.9 % IV SOLN: INTRAVENOUS | Status: DC

## 2018-11-10 MED ORDER — PROPOFOL 500 MG/50ML IV EMUL
INTRAVENOUS | Status: DC | PRN
  Administered 2018-11-10: 30 mg via INTRAVENOUS

## 2018-11-10 MED ORDER — LIDOCAINE HCL (CARDIAC) PF 100 MG/5ML IV SOSY
PREFILLED_SYRINGE | INTRAVENOUS | Status: DC | PRN
  Administered 2018-11-10: 100 mg via INTRAVENOUS

## 2018-11-10 SURGICAL SUPPLY — 14 items

## 2018-11-10 NOTE — Anesthesia Postprocedure Evaluation (Signed)
Anesthesia Post Note  Patient: Marisa [name]  Procedure(s) Performed: ESOPHAGOGASTRODUODENOSCOPY (EGD) WITH PROPOFOL (N/A )     Patient location during evaluation: PACU Anesthesia Type: MAC Level of consciousness: awake and alert Pain management: pain level controlled Vital Signs Assessment: post-procedure vital signs reviewed and stable Respiratory status: spontaneous breathing, nonlabored ventilation and respiratory function stable Cardiovascular status: stable and blood pressure returned to baseline Anesthetic complications: no    Last Vitals:  Vitals:   11/10/18 1442 11/10/18 1448  BP: 111/79 124/84  Pulse: 83 71  Resp: (!) 21 18  Temp:    SpO2: 100% 100%    Last Pain:  Vitals:   11/10/18 1433  TempSrc: Oral  PainSc: 0-No pain                 Audry Pili

## 2018-11-10 NOTE — Anesthesia Preprocedure Evaluation (Addendum)
Anesthesia Evaluation  Patient identified by MRN, date of birth, ID band Patient awake    Reviewed: Allergy & Precautions, NPO status , Patient's Chart, lab work & pertinent test results  History of Anesthesia Complications Negative for: history of anesthetic complications  Airway Mallampati: II  TM Distance: >3 FB Neck ROM: Full    Dental  (+) Dental Advisory Given, Teeth Intact   Pulmonary asthma , sleep apnea , former smoker,    Pulmonary exam normal        Cardiovascular negative cardio ROS Normal cardiovascular exam     Neuro/Psych  Headaches, PSYCHIATRIC DISORDERS Anxiety Depression  Neuromuscular disease (pedal neuropathy)    GI/Hepatic Neg liver ROS, GERD  Medicated and Controlled, IBS    Endo/Other  diabetes, Type 2, Oral Hypoglycemic Agents Obesity   Renal/GU negative Renal ROS     Musculoskeletal negative musculoskeletal ROS (+)   Abdominal   Peds  Hematology negative hematology ROS (+)   Anesthesia Other Findings   Reproductive/Obstetrics  S/p hysterectomy                             Anesthesia Physical Anesthesia Plan  ASA: III  Anesthesia Plan: MAC   Post-op Pain Management:    Induction: Intravenous  PONV Risk Score and Plan: 2 and Propofol infusion and Treatment may vary due to age or medical condition  Airway Management Planned: Nasal Cannula and Natural Airway  Additional Equipment: None  Intra-op Plan:   Post-operative Plan:   Informed Consent: I have reviewed the patients History and Physical, chart, labs and discussed the procedure including the risks, benefits and alternatives for the proposed anesthesia with the patient or authorized representative who has indicated his/her understanding and acceptance.       Plan Discussed with: CRNA and Anesthesiologist  Anesthesia Plan Comments:        Anesthesia Quick Evaluation

## 2018-11-10 NOTE — Op Note (Signed)
[facility] Patient Name: Marisa [name] Procedure Date: 11/10/2018 MRN: [id number] Attending MD: Estill Cotta. Loletha Carrow , MD Date of Birth: 02-Sep-1975 CSN: [id number] Age: 43 Admit Type: Outpatient Procedure:                Upper GI endoscopy Indications:              Epigastric abdominal pain, Unexplained chest pain                            (which often occurs with dyspnea and anxiety) - has                            resulted in multiple ED visits. Normal CTA chest                            and CTAP 09/17/18 Providers:                Mallie Mussel L. Loletha Carrow, MD, Cleda Daub, RN, Lina Sar, Technician, Elspeth Cho Tech.,                            Technician, Arnoldo Hooker, CRNA Referring MD:             Antony Blackbird, MD Medicines:                Monitored Anesthesia Care Complications:            No immediate complications. Estimated Blood Loss:     Estimated blood loss: none. Procedure:                Pre-Anesthesia Assessment:                           - Prior to the procedure, a History and Physical                            was performed, and patient medications and                            allergies were reviewed. The patient's tolerance of                            previous anesthesia was also reviewed. The risks                            and benefits of the procedure and the sedation                            options and risks were discussed with the patient.                            All questions were answered, and informed consent  was obtained. Prior Anticoagulants: The patient has                            taken no previous anticoagulant or antiplatelet                            agents. ASA Grade Assessment: II - A patient with                            mild systemic disease. After reviewing the risks                            and benefits, the patient was deemed in   satisfactory condition to undergo the procedure.                           After obtaining informed consent, the endoscope was                            passed under direct vision. Throughout the                            procedure, the patient's blood pressure, pulse, and                            oxygen saturations were monitored continuously. The                            [id number]-[id number] Olympus gastroscope was                            introduced through the mouth, and advanced to the                            second part of duodenum. The upper GI endoscopy was                            accomplished without difficulty. The patient                            tolerated the procedure well. Scope In: Scope Out: Findings:      The esophagus was normal.      The stomach was normal.      The cardia and gastric fundus were normal on retroflexion.      The examined duodenum was normal. Impression:               - Normal esophagus.                           - Normal stomach.                           - Normal examined duodenum.                           -  No specimens collected.                           No visible cause of either chest pain or epigastric                            pain was seen on this exam. There is possibly an                            element of anxiety contributing. Moderate Sedation:      Not Applicable - Patient had care per Anesthesia. Recommendation:           - Patient has a contact number available for                            emergencies. The signs and symptoms of potential                            delayed complications were discussed with the                            patient. Return to normal activities tomorrow.                            Written discharge instructions were provided to the                            patient.                           - Resume previous diet.                           - Continue present medications.                            - Return to primary care physician. Procedure Code(s):        --- Professional ---                           [phone], Esophagogastroduodenoscopy, flexible,                            transoral; diagnostic, including collection of                            specimen(s) by brushing or washing, when performed                            (separate procedure) Diagnosis Code(s):        --- Professional ---                           R10.13, Epigastric pain                           R07.9, Chest  pain, unspecified CPT copyright 2019 American Medical Association. All rights reserved. The codes documented in this report are preliminary and upon coder review may  be revised to meet current compliance requirements. Toure Edmonds L. Loletha Carrow, MD 11/10/2018 2:33:07 PM This report has been signed electronically. Number of Addenda: 0

## 2018-11-10 NOTE — Transfer of Care (Signed)
Immediate Anesthesia Transfer of Care Note  Patient: Marisa [name]  Procedure(s) Performed: Procedure(s): ESOPHAGOGASTRODUODENOSCOPY (EGD) WITH PROPOFOL (N/A)  Patient Location: PACU  Anesthesia Type:MAC  Level of Consciousness:  sedated, patient cooperative and responds to stimulation  Airway & Oxygen Therapy:Patient Spontanous Breathing and Patient connected to face mask oxgen  Post-op Assessment:  Report given to PACU RN and Post -op Vital signs reviewed and stable  Post vital signs:  Reviewed and stable  Last Vitals:  Vitals:   11/10/18 1215 11/10/18 1433  BP: 120/84 111/69  Pulse: 72 79  Resp: 12 20  Temp: 36.7 C   SpO2: 80% 034%    Complications: No apparent anesthesia complications

## 2018-11-10 NOTE — H&P (Signed)
History:  This patient presents for endoscopic testing for chest and epigastric pain.  Marisa [name] Referring physician: Antony Blackbird, MD  Past Medical History: Past Medical History:  Diagnosis Date  . Allergy   . Anal pain    chronic  . Anemia   . Anxiety   . Asthma    exacerbation 02-28-2014 and 02-23-2014 secondary to Rhinovirus  . Chronic headaches   . Chronic low back pain   . Cyst of right ovary   . Difficult intravenous access    PER PT NEEDS PICC LINE  . Gait instability   . GERD (gastroesophageal reflux disease)   . History of adenomatous polyp of colon   . History of cardiac arrest    during SVD 1992  . History of ectopic pregnancy    2009-  S/P LEFT SALPINGECTOMY  . History of panic attacks   . IBS (irritable bowel syndrome)   . Lumbar stenosis L4 -- L5 with bulging disk   w/ right leg weakness/ decreased mobility  . Mild obstructive sleep apnea    study 03-20-2014  no cpap recommended  . Neuromuscular disorder (HCC)    neuropathy in feet   . Sleep apnea    mild no cpap  . Type 2 diabetes mellitus ([place])   . Weakness of right leg    FROM BACK PROBLEM PER PT     Past Surgical History: Past Surgical History:  Procedure Laterality Date  . ABDOMINAL HYSTERECTOMY    . COLONOSCOPY Left 04/29/2013   Procedure: COLONOSCOPY;  Surgeon: Arta Silence, MD;  Location: [facility];  Service: Endoscopy;  Laterality: Left;  . COLONOSCOPY    . COLONOSCOPY WITH PROPOFOL N/A 08/01/2016   Procedure: COLONOSCOPY WITH PROPOFOL;  Surgeon: Doran Stabler, MD;  Location: [facility];  Service: Gastroenterology;  Laterality: N/A;  . EVALUATION UNDER ANESTHESIA WITH FISTULECTOMY N/A 04/20/2014   Procedure: EXAM UNDER ANESTHESIA ;  Surgeon: Leighton Ruff, MD;  Location: [facility];  Service: General;  Laterality: N/A;  . FLEXIBLE SIGMOIDOSCOPY N/A 11/09/2013   Procedure: FLEXIBLE SIGMOIDOSCOPY;  Surgeon: Arta Silence, MD;  Location: [facility];   Service: Endoscopy;  Laterality: N/A;  . fupa removal     . LAPAROSCOPIC CHOLECYSTECTOMY  2005  . SPHINCTEROTOMY N/A 04/20/2014   Procedure:  LATERAL INTERNAL SPHINCTEROTOMY;  Surgeon: Leighton Ruff, MD;  Location: [facility];  Service: General;  Laterality: N/A;  . TRANSTHORACIC ECHOCARDIOGRAM  12-30-2012   mild LVH/  ef 55-60%  . UNILATERAL SALPINGECTOMY  2009   laparotomy left salpingectomy-- ectopic preg.  [facility] Kitchen UPPER GASTROINTESTINAL ENDOSCOPY    . VAGINAL HYSTERECTOMY N/A 01/06/2013   Procedure: HYSTERECTOMY VAGINAL;  Surgeon: Osborne Oman, MD;  Location: [facility];  Service: Gynecology;  Laterality: N/A;    Allergies: Allergies  Allergen Reactions  . Asa [Aspirin] Anaphylaxis    Hives, chest tightness   . Mushroom Extract Complex Anaphylaxis, Swelling and Other (See Comments)    Reaction:  Eye swelling  . Penicillins Anaphylaxis and Other (See Comments)    Has patient had a PCN reaction causing immediate rash, facial/tongue/throat swelling, SOB or lightheadedness with hypotension: Yes Has patient had a PCN reaction causing severe rash involving mucus membranes or skin necrosis: No Has patient had a PCN reaction that required hospitalization No Has patient had a PCN reaction occurring within the last 10 years: No If all of the above answers are "NO", then may proceed with Cephalosporin use.  . Shellfish Allergy Anaphylaxis  .  Triamcinolone Other (See Comments)    Skin issues     Outpatient Meds: Current Facility-Administered Medications  Medication Dose Route Frequency Provider Last Rate Last Dose  . 0.9 %  sodium chloride infusion   Intravenous Continuous Danis, Estill Cotta III, MD      . lactated ringers infusion   Intravenous Continuous Danis, Estill Cotta III, MD          ___________________________________________________________________ Objective   Exam:  BP 120/84   Pulse 72   Temp 98 F (36.7 C) (Oral)   Resp 12   Ht 5\' 5"  (1.651 m)   Wt 81.6 kg    LMP 11/27/2012   SpO2 99%   BMI 29.95 kg/m    CV: RRR without murmur, S1/S2, no JVD, no peripheral edema  Resp: clear to auscultation bilaterally, normal RR and effort noted  GI: soft, generalized tenderness as before, with active bowel sounds. No guarding or palpable organomegaly noted.  Neuro: awake, alert and oriented x 3. Normal gross motor function and fluent speech   Assessment:  Chest and epigastric pain - ? GI cause  Plan:  EGD   [name]

## 2018-11-10 NOTE — Interval H&P Note (Signed)
History and Physical Interval Note:  11/10/2018 1:54 PM  Marisa [name]  has presented today for surgery, with the diagnosis of Epigastric pain GERD.  The various methods of treatment have been discussed with the patient and family. After consideration of risks, benefits and other options for treatment, the patient has consented to  Procedure(s): ESOPHAGOGASTRODUODENOSCOPY (EGD) WITH PROPOFOL (N/A) as a surgical intervention.  The patient's history has been reviewed, patient examined, no change in status, stable for surgery.  I have reviewed the patient's chart and labs.  Questions were answered to the patient's satisfaction.     [name]

## 2018-11-10 NOTE — Discharge Instructions (Signed)
YOU HAD AN ENDOSCOPIC PROCEDURE TODAY: Refer to the procedure report and other information in the discharge instructions given to you for any specific questions about what was found during the examination. If this information does not answer your questions, please call [place] office at [phone] to clarify.   YOU SHOULD EXPECT: Some feelings of bloating in the abdomen. Passage of more gas than usual. Walking can help get rid of the air that was put into your GI tract during the procedure and reduce the bloating. If you had a lower endoscopy (such as a colonoscopy or flexible sigmoidoscopy) you may notice spotting of blood in your stool or on the toilet paper. Some abdominal soreness may be present for a day or two, also.  DIET: Your first meal following the procedure should be a light meal and then it is ok to progress to your normal diet. A half-sandwich or bowl of soup is an example of a good first meal. Heavy or fried foods are harder to digest and may make you feel nauseous or bloated. Drink plenty of fluids but you should avoid alcoholic beverages for 24 hours. If you had a esophageal dilation, please see attached instructions for diet.    ACTIVITY: Your care partner should take you home directly after the procedure. You should plan to take it easy, moving slowly for the rest of the day. You can resume normal activity the day after the procedure however YOU SHOULD NOT DRIVE, use power tools, machinery or perform tasks that involve climbing or major physical exertion for 24 hours (because of the sedation medicines used during the test).   SYMPTOMS TO REPORT IMMEDIATELY: A gastroenterologist can be reached at any hour. Please call [phone]  for any of the following symptoms:   Following upper endoscopy (EGD, EUS, ERCP, esophageal dilation) Vomiting of blood or coffee ground material  New, significant abdominal pain  New, significant chest pain or pain under the shoulder blades  Painful or  persistently difficult swallowing  New shortness of breath  Black, tarry-looking or red, bloody stools  FOLLOW UP:  If any biopsies were taken you will be contacted by phone or by letter within the next 1-3 weeks. Call [phone]  if you have not heard about the biopsies in 3 weeks.  Please also call with any specific questions about appointments or follow up tests.  

## 2018-11-11 ENCOUNTER — Encounter (HOSPITAL_COMMUNITY): Payer: Self-pay | Admitting: Gastroenterology

## 2018-11-11 ENCOUNTER — Telehealth: Payer: Self-pay | Admitting: Family Medicine

## 2018-11-11 NOTE — Telephone Encounter (Signed)
Patient called stating that she would like to be  Prescribed something for anxiety please follow up.

## 2018-11-12 ENCOUNTER — Other Ambulatory Visit: Payer: Self-pay

## 2018-11-12 ENCOUNTER — Ambulatory Visit (INDEPENDENT_AMBULATORY_CARE_PROVIDER_SITE_OTHER): Payer: Medicare Other | Admitting: Podiatry

## 2018-11-12 ENCOUNTER — Encounter: Payer: Self-pay | Admitting: Family Medicine

## 2018-11-12 ENCOUNTER — Ambulatory Visit: Payer: Medicare Other | Attending: Family Medicine | Admitting: Family Medicine

## 2018-11-12 ENCOUNTER — Encounter: Payer: Self-pay | Admitting: Podiatry

## 2018-11-12 DIAGNOSIS — G479 Sleep disorder, unspecified: Secondary | ICD-10-CM | POA: Diagnosis not present

## 2018-11-12 DIAGNOSIS — F410 Panic disorder [episodic paroxysmal anxiety] without agoraphobia: Secondary | ICD-10-CM

## 2018-11-12 DIAGNOSIS — R600 Localized edema: Secondary | ICD-10-CM

## 2018-11-12 DIAGNOSIS — J4520 Mild intermittent asthma, uncomplicated: Secondary | ICD-10-CM | POA: Diagnosis not present

## 2018-11-12 DIAGNOSIS — M79672 Pain in left foot: Secondary | ICD-10-CM

## 2018-11-12 DIAGNOSIS — F411 Generalized anxiety disorder: Secondary | ICD-10-CM

## 2018-11-12 DIAGNOSIS — M722 Plantar fascial fibromatosis: Secondary | ICD-10-CM

## 2018-11-12 DIAGNOSIS — M79671 Pain in right foot: Secondary | ICD-10-CM | POA: Diagnosis not present

## 2018-11-12 MED ORDER — TRAZODONE HCL 50 MG PO TABS: 25.0000 mg | ORAL_TABLET | Freq: Every evening | ORAL | 3 refills | Status: DC | PRN

## 2018-11-12 MED ORDER — ALBUTEROL SULFATE HFA 108 (90 BASE) MCG/ACT IN AERS: 2.0000 | INHALATION_SPRAY | Freq: Four times a day (QID) | RESPIRATORY_TRACT | 11 refills | Status: DC | PRN

## 2018-11-12 MED ORDER — IBUPROFEN 800 MG PO TABS: 800.0000 mg | ORAL_TABLET | Freq: Three times a day (TID) | ORAL | 0 refills | Status: DC | PRN

## 2018-11-12 MED ORDER — HYDROCODONE-ACETAMINOPHEN 10-325 MG PO TABS: 1.0000 | ORAL_TABLET | Freq: Four times a day (QID) | ORAL | 0 refills | Status: AC | PRN

## 2018-11-12 MED ORDER — SERTRALINE HCL 50 MG PO TABS: 50.0000 mg | ORAL_TABLET | Freq: Every day | ORAL | 3 refills | Status: DC

## 2018-11-12 MED ORDER — HYDROXYZINE HCL 10 MG PO TABS: ORAL_TABLET | ORAL | 1 refills | Status: DC

## 2018-11-12 NOTE — Progress Notes (Signed)
Subjective:  Patient ID: Marisa [name], female    DOB: May 10, 1975,  MRN: [id number]  Chief Complaint  Patient presents with  . Routine Post Op    DOS 11/02/2018 EPF RT - "I'm hurting bad, other than that I'm fine."    DOS: see above Procedure: see above  43 y.o. female returns for post-op check.  She is doing well.  She states that she still has discomfort on the plantar aspect of the heel.  She has been ambulating with a cam boot at all times.  She has also run out of her pain medication.  Otherwise no acute complaints.  Review of Systems: Negative except as noted in the HPI. Denies N/V/F/Ch.  Past Medical History:  Diagnosis Date  . Allergy   . Anal pain    chronic  . Anemia   . Anxiety   . Asthma    exacerbation 02-28-2014 and 02-23-2014 secondary to Rhinovirus  . Chronic headaches   . Chronic low back pain   . Cyst of right ovary   . Difficult intravenous access    PER PT NEEDS PICC LINE  . Gait instability   . GERD (gastroesophageal reflux disease)   . History of adenomatous polyp of colon   . History of cardiac arrest    during SVD 1992  . History of ectopic pregnancy    2009-  S/P LEFT SALPINGECTOMY  . History of panic attacks   . IBS (irritable bowel syndrome)   . Lumbar stenosis L4 -- L5 with bulging disk   w/ right leg weakness/ decreased mobility  . Mild obstructive sleep apnea    study 03-20-2014  no cpap recommended  . Neuromuscular disorder (HCC)    neuropathy in feet   . Sleep apnea    mild no cpap  . Type 2 diabetes mellitus (Marisa [name])   . Weakness of right leg    FROM BACK PROBLEM PER PT    Current Outpatient Medications:  .  albuterol (PROVENTIL) (2.5 MG/3ML) 0.083% nebulizer solution, Take 3 mLs (2.5 mg total) by nebulization every 6 (six) hours as needed for wheezing or shortness of breath., Disp: 75 mL, Rfl: 5 .  albuterol (VENTOLIN HFA) 108 (90 Base) MCG/ACT inhaler, Inhale 1-2 puffs into the lungs every 6 (six) hours as needed for  wheezing or shortness of breath., Disp: 8.5 g, Rfl: 2 .  dicyclomine (BENTYL) 10 MG capsule, Take 1 capsule (10 mg total) by mouth 3 (three) times daily before meals., Disp: 90 capsule, Rfl: 2 .  fenofibrate (TRICOR) 48 MG tablet, Take 1 tablet (48 mg total) by mouth daily., Disp: 90 tablet, Rfl: 1 .  gabapentin (NEURONTIN) 300 MG capsule, Take 1 capsule (300 mg total) by mouth 3 (three) times daily. (Patient not taking: Reported on 11/05/2018), Disp: 270 capsule, Rfl: 1 .  HYDROcodone-acetaminophen (NORCO) 10-325 MG tablet, Take 1 tablet by mouth every 6 (six) hours as needed for up to 5 days., Disp: 15 tablet, Rfl: 0 .  ibuprofen (ADVIL) 800 MG tablet, Take 1 tablet (800 mg total) by mouth every 8 (eight) hours as needed., Disp: 30 tablet, Rfl: 0 .  metFORMIN (GLUCOPHAGE) 500 MG tablet, Take 1 tablet (500 mg total) by mouth 2 (two) times daily with a meal., Disp: 60 tablet, Rfl: 5 .  mometasone-formoterol (DULERA) 100-5 MCG/ACT AERO, Inhale 2 puffs into the lungs 2 (two) times daily. (Patient taking differently: Inhale 2 puffs into the lungs every other day. ), Disp: 1 Inhaler, Rfl: 3 .  omeprazole (PRILOSEC) 40 MG capsule, Take 1 capsule (40 mg total) by mouth daily., Disp: 30 capsule, Rfl: 1 .  promethazine (PHENERGAN) 25 MG tablet, Take 1 tablet (25 mg total) by mouth every 8 (eight) hours as needed for nausea or vomiting., Disp: 20 tablet, Rfl: 0  Social History   Tobacco Use  Smoking Status Former Smoker  . Packs/day: 0.20  . Years: 11.00  . Pack years: 2.20  . Types: Cigarettes  . Quit date: 11/17/2013  . Years since quitting: 4.9  Smokeless Tobacco Never Used  Tobacco Comment   2 years quit    Allergies  Allergen Reactions  . Asa [Aspirin] Anaphylaxis    Hives, chest tightness   . Mushroom Extract Complex Anaphylaxis, Swelling and Other (See Comments)    Reaction:  Eye swelling  . Penicillins Anaphylaxis and Other (See Comments)    Has patient had a PCN reaction causing  immediate rash, facial/tongue/throat swelling, SOB or lightheadedness with hypotension: Yes Has patient had a PCN reaction causing severe rash involving mucus membranes or skin necrosis: No Has patient had a PCN reaction that required hospitalization No Has patient had a PCN reaction occurring within the last 10 years: No If all of the above answers are "NO", then may proceed with Cephalosporin use.  . Shellfish Allergy Anaphylaxis  . Triamcinolone Other (See Comments)    Skin issues    Objective:  There were no vitals filed for this visit. There is no height or weight on file to calculate BMI. Constitutional Well developed. Well nourished.  Vascular Foot warm and well perfused. Capillary refill normal to all digits.   Neurologic Normal speech. Oriented to person, place, and time. Epicritic sensation to light touch grossly present bilaterally.  Dermatologic Skin healing well without signs of infection. Skin edges well coapted without signs of infection.  Orthopedic: Tenderness to palpation noted about the surgical site.    Assessment:   1. Bilateral foot pain   2. Plantar fasciitis, bilateral   3. Localized edema    Plan:  Patient was evaluated and treated and all questions answered.  S/p foot surgery right -Progressing as expected post-operatively. -XR: None -WB Status: WBAT in CAM boot -Sutures: Intact. Well coapted. No clinical signs of infection. -Medications: Ibuprofen and Vicodin dispensed. -Foot redressed -Ankle brace compression was dispensed for moderate edema.  No follow-ups on file.

## 2018-11-12 NOTE — Telephone Encounter (Signed)
Patient was set a televisit with PCP

## 2018-11-12 NOTE — Progress Notes (Signed)
Virtual Visit via Telephone Note  I connected with Marisa [name] on 11/12/18 at 11:10 AM EDT by telephone and verified that I am speaking with the correct person using two identifiers.   I discussed the limitations, risks, security and privacy concerns of performing an evaluation and management service by telephone and the availability of in person appointments. I also discussed with the patient that there may be a patient responsible charge related to this service. The patient expressed understanding and agreed to proceed.  Patient Location: At her pharmacy Provider Location: [facility] Others participating in call: none   History of Present Illness:      43 yo female with the complaint of an increase in anxiety attacks- now occurs for each morning and usually every other night. She feels that this has increased within the past 3 weeks but she is not sure why. She is moving soon but she does not think that this is the cause of her anxiety. She was on medication in the past for anxiety.  She cannot recall which medicine she took in the past.  Per patient she will have the sensation of shortness of breath as if she cannot get in a full breath and will feel as if something bad is about to happen. She has gone to the ED thinking that she is having issues with her asthma but when she gets to the ED she is told that her lung exam is fine.  She reports that overall her asthma continues to be well controlled on her Ruthe Mannan, now that she has figured out that she is more than likely having panic attacks and not having increased asthma symptoms, but she needs a refill of albuterol inhaler.          She also reports that she is having difficulty falling asleep and staying asleep.  Patient states that she wakes up fatigued each morning because of her poor sleep throughout the night.  She denies any cough, wheezing or shortness of breath at night which interrupts her sleep.  She has had no recent fever or  chills, no cough or nasal congestion, no fever or chills, no chest pain or palpitations and no abdominal pain-no nausea or vomiting.  She has had no increased lower extremity edema or pain in the lower extremities suggestive of blood clot/DVT.  She reports no suicidal thoughts or ideations.  She currently speaks with her pastor to receive counseling.  Past Medical History:  Diagnosis Date  . Allergy   . Anal pain    chronic  . Anemia   . Anxiety   . Asthma    exacerbation 02-28-2014 and 02-23-2014 secondary to Rhinovirus  . Chronic headaches   . Chronic low back pain   . Cyst of right ovary   . Difficult intravenous access    PER PT NEEDS PICC LINE  . Gait instability   . GERD (gastroesophageal reflux disease)   . History of adenomatous polyp of colon   . History of cardiac arrest    during SVD 1992  . History of ectopic pregnancy    2009-  S/P LEFT SALPINGECTOMY  . History of panic attacks   . IBS (irritable bowel syndrome)   . Lumbar stenosis L4 -- L5 with bulging disk   w/ right leg weakness/ decreased mobility  . Mild obstructive sleep apnea    study 03-20-2014  no cpap recommended  . Neuromuscular disorder (HCC)    neuropathy in feet   . Sleep  apnea    mild no cpap  . Type 2 diabetes mellitus ([place])   . Weakness of right leg    FROM BACK PROBLEM PER PT    Past Surgical History:  Procedure Laterality Date  . ABDOMINAL HYSTERECTOMY    . COLONOSCOPY Left 04/29/2013   Procedure: COLONOSCOPY;  Surgeon: Arta Silence, MD;  Location: [facility];  Service: Endoscopy;  Laterality: Left;  . COLONOSCOPY    . COLONOSCOPY WITH PROPOFOL N/A 08/01/2016   Procedure: COLONOSCOPY WITH PROPOFOL;  Surgeon: Doran Stabler, MD;  Location: [facility];  Service: Gastroenterology;  Laterality: N/A;  . ESOPHAGOGASTRODUODENOSCOPY (EGD) WITH PROPOFOL N/A 11/10/2018   Procedure: ESOPHAGOGASTRODUODENOSCOPY (EGD) WITH PROPOFOL;  Surgeon: Doran Stabler, MD;  Location: [facility];   Service: Gastroenterology;  Laterality: N/A;  . EVALUATION UNDER ANESTHESIA WITH FISTULECTOMY N/A 04/20/2014   Procedure: EXAM UNDER ANESTHESIA ;  Surgeon: Leighton Ruff, MD;  Location: [facility];  Service: General;  Laterality: N/A;  . FLEXIBLE SIGMOIDOSCOPY N/A 11/09/2013   Procedure: FLEXIBLE SIGMOIDOSCOPY;  Surgeon: Arta Silence, MD;  Location: [facility];  Service: Endoscopy;  Laterality: N/A;  . fupa removal     . LAPAROSCOPIC CHOLECYSTECTOMY  2005  . SPHINCTEROTOMY N/A 04/20/2014   Procedure:  LATERAL INTERNAL SPHINCTEROTOMY;  Surgeon: Leighton Ruff, MD;  Location: [facility];  Service: General;  Laterality: N/A;  . TRANSTHORACIC ECHOCARDIOGRAM  12-30-2012   mild LVH/  ef 55-60%  . UNILATERAL SALPINGECTOMY  2009   laparotomy left salpingectomy-- ectopic preg.  [facility] UPPER GASTROINTESTINAL ENDOSCOPY    . VAGINAL HYSTERECTOMY N/A 01/06/2013   Procedure: HYSTERECTOMY VAGINAL;  Surgeon: Osborne Oman, MD;  Location: [facility];  Service: Gynecology;  Laterality: N/A;    Family History  Problem Relation Age of Onset  . Hypertension Mother   . Diabetes Mother   . Allergies Mother   . Heart disease Mother   . Clotting disorder Mother   . Cancer Father   . Hyperlipidemia Father   . Hypertension Father   . Colon cancer Father   . Heart disease Maternal Grandmother   . Breast cancer Maternal Grandmother 65  . Schizophrenia Sister   . Bipolar disorder Sister   . Clotting disorder Sister   . Bipolar disorder Brother   . Kidney disease Brother   . Bipolar disorder Sister   . Pancreatic cancer Maternal Aunt   . Prostate cancer Maternal Uncle   . Liver cancer Maternal Grandfather   . Rectal cancer Maternal Grandfather   . Liver cancer Paternal Grandfather   . Colon polyps Neg Hx   . Esophageal cancer Neg Hx   . Stomach cancer Neg Hx     Social History   Tobacco Use  . Smoking status: Former Smoker    Packs/day: 0.20    Years: 11.00    Pack years:  2.20    Types: Cigarettes    Quit date: 11/17/2013    Years since quitting: 4.9  . Smokeless tobacco: Never Used  . Tobacco comment: 2 years quit  Substance Use Topics  . Alcohol use: No    Alcohol/week: 0.0 standard drinks  . Drug use: No     Allergies  Allergen Reactions  . Asa [Aspirin] Anaphylaxis    Hives, chest tightness   . Mushroom Extract Complex Anaphylaxis, Swelling and Other (See Comments)    Reaction:  Eye swelling  . Penicillins Anaphylaxis and Other (See Comments)    Has patient had a PCN  reaction causing immediate rash, facial/tongue/throat swelling, SOB or lightheadedness with hypotension: Yes Has patient had a PCN reaction causing severe rash involving mucus membranes or skin necrosis: No Has patient had a PCN reaction that required hospitalization No Has patient had a PCN reaction occurring within the last 10 years: No If all of the above answers are "NO", then may proceed with Cephalosporin use.  . Shellfish Allergy Anaphylaxis  . Triamcinolone Other (See Comments)    Skin issues        Observations/Objective: No vital signs or physical exam conducted as visit was done via telephone  Assessment and Plan: 1. Mild intermittent asthma without complication She feels that her asthma is currently controlled with the use of Dulera.  New prescription sent to patient's pharmacy for albuterol.  Patient is reminded to take [facility] twice daily each day as she had in the past been taking the medication every other day. - albuterol (VENTOLIN HFA) 108 (90 Base) MCG/ACT inhaler; Inhale 2 puffs into the lungs every 6 (six) hours as needed for wheezing or shortness of breath.  Dispense: 18 g; Refill: 11  2. Sleep disturbance She reports sleep disturbance which may be related to her anxiety.  Prescription provided for trazodone to take half to 1 p.o. at bedtime as needed for sleep.  Sleep hygiene reviewed - traZODone (DESYREL) 50 MG tablet; Take 0.5-1 tablets (25-50 mg total)  by mouth at bedtime as needed for sleep.  Dispense: 30 tablet; Refill: 3  3. Generalized anxiety disorder with panic attacks Patient with complaint of recent increase in anxiety as well as panic attacks.  Prescription sent to patient's pharmacy for Zoloft 50 mg to start half pill daily for 7 days then increase to 1 whole pill.  Also consider use of BuSpar but patient is unlikely to be compliant with dosing multiple times per day.  Prescription provided for trazodone 50 mg to help with sleep.  Prescription for hydroxyzine 10 mg to take 1 or 2 pills every 6-8 hours as needed for acute anxiety.  Dose of 10 mg rather than 25 chosen to help prevent oversedation.  Patient also agrees to referral to psychology for counseling.  She is to follow-up in 4 weeks but call or return sooner if she has increased anxiety, suicidal thoughts or ideations or any concerns. - sertraline (ZOLOFT) 50 MG tablet; Take 1 tablet (50 mg total) by mouth at bedtime. Start with 1/2 pill daily for 7 days then increase to 1 pill  Dispense: 30 tablet; Refill: 3 - traZODone (DESYREL) 50 MG tablet; Take 0.5-1 tablets (25-50 mg total) by mouth at bedtime as needed for sleep.  Dispense: 30 tablet; Refill: 3 - hydrOXYzine (ATARAX/VISTARIL) 10 MG tablet; One or two pills every 6-8 hours as needed for acute anxiety  Dispense: 60 tablet; Refill: 1 - Ambulatory referral to Psychology  Follow Up Instructions:    I discussed the assessment and treatment plan with the patient. The patient was provided an opportunity to ask questions and all were answered. The patient agreed with the plan and demonstrated an understanding of the instructions.   The patient was advised to call back or seek an in-person evaluation if the symptoms worsen or if the condition fails to improve as anticipated.  I provided 13 minutes of non-face-to-face time during this encounter.   Antony Blackbird, MD

## 2018-11-12 NOTE — Telephone Encounter (Signed)
Please contact patient and offer telemedicine visit regarding her anxiety- can be done this morning-just let me know

## 2018-11-12 NOTE — Progress Notes (Signed)
Patient has been called and DOB has been verified. Patient has been screened and transferred to PCP to start phone visit.   Patient is having very bad anxiety. Patient states that it happens every morning.

## 2018-11-24 ENCOUNTER — Encounter: Payer: Self-pay | Admitting: Podiatry

## 2018-11-24 ENCOUNTER — Ambulatory Visit (INDEPENDENT_AMBULATORY_CARE_PROVIDER_SITE_OTHER): Payer: Medicare Other | Admitting: Podiatry

## 2018-11-24 ENCOUNTER — Other Ambulatory Visit: Payer: Self-pay

## 2018-11-24 DIAGNOSIS — M722 Plantar fascial fibromatosis: Secondary | ICD-10-CM | POA: Diagnosis not present

## 2018-11-24 NOTE — Patient Instructions (Signed)
Pre-Operative Instructions  Congratulations, you have decided to take an important step towards improving your quality of life.  You can be assured that the doctors and staff at [facility] will be with you every step of the way.  Here are some important things you should know:  1. Plan to be at the surgery center/hospital at least 1 (one) hour prior to your scheduled time, unless otherwise directed by the surgical center/hospital staff.  You must have a responsible adult accompany you, remain during the surgery and drive you home.  Make sure you have directions to the surgical center/hospital to ensure you arrive on time. 2. If you are having surgery at [facility] or [facility], you will need a copy of your medical history and physical form from your family physician within one month prior to the date of surgery. We will give you a form for your primary physician to complete.  3. We make every effort to accommodate the date you request for surgery.  However, there are times where surgery dates or times have to be moved.  We will contact you as soon as possible if a change in schedule is required.   4. No aspirin/ibuprofen for one week before surgery.  If you are on aspirin, any non-steroidal anti-inflammatory medications (Mobic, Aleve, Ibuprofen) should not be taken seven (7) days prior to your surgery.  You make take Tylenol for pain prior to surgery.  5. Medications - If you are taking daily heart and blood pressure medications, seizure, reflux, allergy, asthma, anxiety, pain or diabetes medications, make sure you notify the surgery center/hospital before the day of surgery so they can tell you which medications you should take or avoid the day of surgery. 6. No food or drink after midnight the night before surgery unless directed otherwise by surgical center/hospital staff. 7. No alcoholic beverages 24-hours prior to surgery.  No smoking 24-hours prior or 24-hours after  surgery. 8. Wear loose pants or shorts. They should be loose enough to fit over bandages, boots, and casts. 9. Don't wear slip-on shoes. Sneakers are preferred. 10. Bring your boot with you to the surgery center/hospital.  Also bring crutches or a walker if your physician has prescribed it for you.  If you do not have this equipment, it will be provided for you after surgery. 11. If you have not been contacted by the surgery center/hospital by the day before your surgery, call to confirm the date and time of your surgery. 12. Leave-time from work may vary depending on the type of surgery you have.  Appropriate arrangements should be made prior to surgery with your employer. 13. Prescriptions will be provided immediately following surgery by your doctor.  Fill these as soon as possible after surgery and take the medication as directed. Pain medications will not be refilled on weekends and must be approved by the doctor. 14. Remove nail polish on the operative foot and avoid getting pedicures prior to surgery. 15. Wash the night before surgery.  The night before surgery wash the foot and leg well with water and the antibacterial soap provided. Be sure to pay special attention to beneath the toenails and in between the toes.  Wash for at least three (3) minutes. Rinse thoroughly with water and dry well with a towel.  Perform this wash unless told not to do so by your physician.  Enclosed: 1 Ice pack (please put in freezer the night before surgery)   1 Hibiclens skin cleaner     Pre-op instructions  If you have any questions regarding the instructions, please do not hesitate to call our office.  [place]: [address], [place], [place] [address] -- [phone]  [place]: [address] [place], [place] [address] -- [phone]  [place]: [address]  [place],  [address] -- [phone]   Website: [email or web address] 

## 2018-11-28 NOTE — Progress Notes (Signed)
Subjective:   Patient ID: Marisa [name], female   DOB: 43 y.o.   MRN: [id number]   HPI Patient presents stating overall doing pretty well but do get some soreness in my foot especially at night   ROS      Objective:  Physical Exam  Neurovascular status intact negative Homans sign noted with patient's right foot showing soreness but overall improving with stitches intact     Assessment:  Plantar fascial symptomatology right improving with surgery with stitches and incision sites healing well     Plan:  H&P conditions reviewed and at this time stitches removed wound edges coapted well advised on continued elevation compression and patient will be seen back on an as-needed basis but should heal uneventfully with all instructions given today

## 2018-12-03 NOTE — Progress Notes (Deleted)
Cardiology Office Note   Date:  12/03/2018   ID:  [name], DOB [birth year], MRN [id number]  PCP:  Antony Blackbird, MD    No chief complaint on file.    Wt Readings from Last 3 Encounters:  11/10/18 180 lb (81.6 kg)  10/14/18 208 lb (94.3 kg)  09/16/18 208 lb (94.3 kg)       History of Present Illness: Marisa [name] is a 43 y.o. female who is being seen today for the evaluation of *** at the request of Fulp, Cammie, MD.  History of cardiac arrest during spontaneous vaginal delivery in 1992.  Past Medical History:  Diagnosis Date  . Abdominal pain 04/26/2013  . Abnormal uterine bleeding (AUB) 10/11/2012  . Acute bronchitis   . Allergy   . Anal pain    chronic  . Anemia   . Anxiety   . Asthma    exacerbation 02-28-2014 and 02-23-2014 secondary to Rhinovirus  . Atypical chest pain 04/26/2013  . Benign neoplasm of sigmoid colon   . Benign neoplasm of transverse colon   . Carbuncle of labium 07/12/2015  . Chest pain 02/20/2014  . Chronic diarrhea   . Chronic headaches   . Chronic low back pain   . Cigarette nicotine dependence without complication 123XX123  . Cyst of right ovary   . Dandruff 03/26/2015  . Depression 03/27/2014  . Diabetes mellitus without complication ([place]) 123XX123  . Difficult intravenous access    PER PT NEEDS PICC LINE  . Dyspnea 09/07/2012   [name] 08/2012:  No obstruction by FEV1%, but probable restriction.    . Falls 03/27/2014  . GAD (generalized anxiety disorder) 05/04/2014  . Gait disturbance 04/11/2014  . Gait instability   . GERD (gastroesophageal reflux disease)   . History of adenomatous polyp of colon   . History of cardiac arrest    during SVD 1992  . History of ectopic pregnancy    2009-  S/P LEFT SALPINGECTOMY  . History of panic attacks   . IBS (irritable bowel syndrome)   . Insomnia 03/06/2014  . Lumbar stenosis L4 -- L5 with bulging disk   w/ right leg weakness/ decreased mobility  . Migraine variant with  headache 05/09/2014  . Mild obstructive sleep apnea    study 03-20-2014  no cpap recommended  . Neuromuscular disorder (HCC)    neuropathy in feet   . Neuropathic pain of both legs 06/04/2016  . Obesity (BMI 30-39.9) 03/05/2018  . OSA (obstructive sleep apnea) 03/06/2014  . 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  . Panniculitis 03/05/2018  . Pelvic pain 07/25/2013  . Persistent vomiting 04/27/2013  . PTSD (post-traumatic stress disorder) 05/04/2014  . Rash and nonspecific skin eruption 07/12/2015  . Rectal bleeding 07/25/2013  . RLQ abdominal pain   . S/P Total vaginal hysterectomy on 01/06/13 01/06/2013  . Severe recurrent major depressive disorder with psychotic features ([place]) 05/04/2014  . Sleep apnea    mild no cpap  . Social anxiety disorder 05/04/2014  . Sore throat 02/20/2014  . Tachycardia 02/20/2014  . Type 2 diabetes mellitus ([place])   . Weakness of right leg    FROM BACK PROBLEM PER PT    Past Surgical History:  Procedure Laterality Date  . ABDOMINAL HYSTERECTOMY    . COLONOSCOPY Left 04/29/2013   Procedure: COLONOSCOPY;  Surgeon: Arta Silence, MD;  Location: [facility];  Service: Endoscopy;  Laterality: Left;  . COLONOSCOPY    . COLONOSCOPY WITH PROPOFOL N/A 08/01/2016  Procedure: COLONOSCOPY WITH PROPOFOL;  Surgeon: Doran Stabler, MD;  Location: [facility];  Service: Gastroenterology;  Laterality: N/A;  . ESOPHAGOGASTRODUODENOSCOPY (EGD) WITH PROPOFOL N/A 11/10/2018   Procedure: ESOPHAGOGASTRODUODENOSCOPY (EGD) WITH PROPOFOL;  Surgeon: Doran Stabler, MD;  Location: [facility];  Service: Gastroenterology;  Laterality: N/A;  . EVALUATION UNDER ANESTHESIA WITH FISTULECTOMY N/A 04/20/2014   Procedure: EXAM UNDER ANESTHESIA ;  Surgeon: Leighton Ruff, MD;  Location[facility];  Service: General;  Laterality: N/A;  . FLEXIBLE SIGMOIDOSCOPY N/A 11/09/2013   Procedure: FLEXIBLE SIGMOIDOSCOPY;  Surgeon: Arta Silence, MD;  Location: [facility];  Service:  Endoscopy;  Laterality: N/A;  . fupa removal     . LAPAROSCOPIC CHOLECYSTECTOMY  2005  . SPHINCTEROTOMY N/A 04/20/2014   Procedure:  LATERAL INTERNAL SPHINCTEROTOMY;  Surgeon: Leighton Ruff, MD;  Location: [facility];  Service: General;  Laterality: N/A;  . TRANSTHORACIC ECHOCARDIOGRAM  12-30-2012   mild LVH/  ef 55-60%  . UNILATERAL SALPINGECTOMY  2009   laparotomy left salpingectomy-- ectopic preg.  [facility] UPPER GASTROINTESTINAL ENDOSCOPY    . VAGINAL HYSTERECTOMY N/A 01/06/2013   Procedure: HYSTERECTOMY VAGINAL;  Surgeon: Osborne Oman, MD;  Location: [facility];  Service: Gynecology;  Laterality: N/A;     Current Outpatient Medications  Medication Sig Dispense Refill  . albuterol (PROVENTIL) (2.5 MG/3ML) 0.083% nebulizer solution Take 3 mLs (2.5 mg total) by nebulization every 6 (six) hours as needed for wheezing or shortness of breath. 75 mL 5  . albuterol (VENTOLIN HFA) 108 (90 Base) MCG/ACT inhaler Inhale 1-2 puffs into the lungs every 6 (six) hours as needed for wheezing or shortness of breath. 8.5 g 2  . albuterol (VENTOLIN HFA) 108 (90 Base) MCG/ACT inhaler Inhale 2 puffs into the lungs every 6 (six) hours as needed for wheezing or shortness of breath. 18 g 11  . dicyclomine (BENTYL) 10 MG capsule Take 1 capsule (10 mg total) by mouth 3 (three) times daily before meals. 90 capsule 2  . fenofibrate (TRICOR) 48 MG tablet Take 1 tablet (48 mg total) by mouth daily. 90 tablet 1  . gabapentin (NEURONTIN) 300 MG capsule Take 1 capsule (300 mg total) by mouth 3 (three) times daily. 270 capsule 1  . HYDROcodone-acetaminophen (NORCO) 10-325 MG tablet Take 1 tablet by mouth every 6 (six) hours as needed.    . hydrOXYzine (ATARAX/VISTARIL) 10 MG tablet One or two pills every 6-8 hours as needed for acute anxiety 60 tablet 1  . ibuprofen (ADVIL) 800 MG tablet Take 1 tablet (800 mg total) by mouth every 8 (eight) hours as needed. (Patient not taking: Reported on 11/12/2018) 30 tablet 0   . metFORMIN (GLUCOPHAGE) 500 MG tablet Take 1 tablet (500 mg total) by mouth 2 (two) times daily with a meal. 60 tablet 5  . mometasone-formoterol (DULERA) 100-5 MCG/ACT AERO Inhale 2 puffs into the lungs 2 (two) times daily. (Patient taking differently: Inhale 2 puffs into the lungs every other day. ) 1 Inhaler 3  . omeprazole (PRILOSEC) 40 MG capsule Take 1 capsule (40 mg total) by mouth daily. 30 capsule 1  . promethazine (PHENERGAN) 25 MG tablet Take 1 tablet (25 mg total) by mouth every 8 (eight) hours as needed for nausea or vomiting. 20 tablet 0  . sertraline (ZOLOFT) 50 MG tablet Take 1 tablet (50 mg total) by mouth at bedtime. Start with 1/2 pill daily for 7 days then increase to 1 pill 30 tablet 3  . traZODone (  DESYREL) 50 MG tablet Take 0.5-1 tablets (25-50 mg total) by mouth at bedtime as needed for sleep. 30 tablet 3   No current facility-administered medications for this visit.     Allergies:   Asa [aspirin], Mushroom extract complex, Penicillins, Shellfish allergy, and Triamcinolone    Social History:  The patient  reports that she quit smoking about 5 years ago. Her smoking use included cigarettes. She has a 2.20 pack-year smoking history. She has never used smokeless tobacco. She reports that she does not drink alcohol or use drugs.   Family History:  The patient's ***family history includes Allergies in her mother; Bipolar disorder in her brother, sister, and sister; Breast cancer (age of onset: 49) in her maternal grandmother; Cancer in her father; Clotting disorder in her mother and sister; Colon cancer in her father; Diabetes in her mother; Heart disease in her maternal grandmother and mother; Hyperlipidemia in her father; Hypertension in her father and mother; Kidney disease in her brother; Liver cancer in her maternal grandfather and paternal grandfather; Pancreatic cancer in her maternal aunt; Prostate cancer in her maternal uncle; Rectal cancer in her maternal grandfather;  Schizophrenia in her sister.    ROS:  Please see the history of present illness.   Otherwise, review of systems are positive for ***.   All other systems are reviewed and negative.    PHYSICAL EXAM: VS:  LMP 11/27/2012  , BMI There is no height or weight on file to calculate BMI. GEN: Well nourished, well developed, in no acute distress  HEENT: normal  Neck: no JVD, carotid bruits, or masses Cardiac: ***RRR; no murmurs, rubs, or gallops,no edema  Respiratory:  clear to auscultation bilaterally, normal work of breathing GI: soft, nontender, nondistended, + BS MS: no deformity or atrophy  Skin: warm and dry, no rash Neuro:  Strength and sensation are intact Psych: euthymic mood, full affect   EKG:   The ekg ordered today demonstrates ***   Recent Labs: 07/28/2018: B Natriuretic Peptide 10.4 09/22/2018: Hemoglobin 13.1; Platelets 377 10/20/2018: ALT 14; BUN 10; Creatinine, Ser 0.71; Potassium 4.2; Sodium 139   Lipid Panel    Component Value Date/Time   CHOL 198 10/20/2018 1052   TRIG 358 (H) 10/20/2018 1052   HDL 43 10/20/2018 1052   CHOLHDL 4.6 (H) 10/20/2018 1052   CHOLHDL 5.2 09/29/2013 1028   VLDL 44 (H) 09/29/2013 1028   LDLCALC 95 10/20/2018 1052     Other studies Reviewed: Additional studies/ records that were reviewed today with results demonstrating: ***.   ASSESSMENT AND PLAN:  1. ***  2. *** 3. ***   Current medicines are reviewed at length with the patient today.  The patient concerns regarding her medicines were addressed.  The following changes have been made:  No change***  Labs/ tests ordered today include: *** No orders of the defined types were placed in this encounter.   Recommend 150 minutes/week of aerobic exercise Low fat, low carb, high fiber diet recommended  Disposition:   FU in ***   Signed, Larae Grooms, MD  12/03/2018 4:44 PM    [facility] [address], [place], [place]  [address] Phone: [phone]; Fax: [phone]

## 2018-12-06 ENCOUNTER — Ambulatory Visit: Payer: Medicare Other | Admitting: Interventional Cardiology

## 2018-12-16 ENCOUNTER — Ambulatory Visit (INDEPENDENT_AMBULATORY_CARE_PROVIDER_SITE_OTHER): Payer: Medicare Other | Admitting: Licensed Clinical Social Worker

## 2018-12-16 DIAGNOSIS — F331 Major depressive disorder, recurrent, moderate: Secondary | ICD-10-CM

## 2018-12-18 ENCOUNTER — Emergency Department (HOSPITAL_COMMUNITY)
Admission: EM | Admit: 2018-12-18 | Discharge: 2018-12-18 | Disposition: A | Discharge status: Home / Self Care | Payer: Medicare Other | Attending: Emergency Medicine | Admitting: Emergency Medicine

## 2018-12-18 ENCOUNTER — Other Ambulatory Visit: Payer: Self-pay

## 2018-12-18 ENCOUNTER — Encounter (HOSPITAL_COMMUNITY): Payer: Self-pay

## 2018-12-18 ENCOUNTER — Emergency Department (HOSPITAL_COMMUNITY): Payer: Medicare Other

## 2018-12-18 DIAGNOSIS — K2900 Acute gastritis without bleeding: Secondary | ICD-10-CM | POA: Insufficient documentation

## 2018-12-18 DIAGNOSIS — Z8709 Personal history of other diseases of the respiratory system: Secondary | ICD-10-CM | POA: Diagnosis not present

## 2018-12-18 DIAGNOSIS — Z79899 Other long term (current) drug therapy: Secondary | ICD-10-CM | POA: Diagnosis not present

## 2018-12-18 DIAGNOSIS — E119 Type 2 diabetes mellitus without complications: Secondary | ICD-10-CM | POA: Insufficient documentation

## 2018-12-18 DIAGNOSIS — Z7984 Long term (current) use of oral hypoglycemic drugs: Secondary | ICD-10-CM | POA: Insufficient documentation

## 2018-12-18 DIAGNOSIS — R0789 Other chest pain: Secondary | ICD-10-CM | POA: Diagnosis present

## 2018-12-18 DIAGNOSIS — Z87891 Personal history of nicotine dependence: Secondary | ICD-10-CM | POA: Insufficient documentation

## 2018-12-18 DIAGNOSIS — R1013 Epigastric pain: Secondary | ICD-10-CM | POA: Insufficient documentation

## 2018-12-18 LAB — I-STAT CHEM 8, ED
BUN: 7 mg/dL (ref 6–20)
Calcium, Ion: 1.11 mmol/L — ABNORMAL LOW (ref 1.15–1.40)
Chloride: 108 mmol/L (ref 98–111)
Creatinine, Ser: 0.7 mg/dL (ref 0.44–1.00)
Glucose, Bld: 96 mg/dL (ref 70–99)
HCT: 44 % (ref 36.0–46.0)
Hemoglobin: 15 g/dL (ref 12.0–15.0)
Potassium: 3.5 mmol/L (ref 3.5–5.1)
Sodium: 144 mmol/L (ref 135–145)
TCO2: 19 mmol/L — ABNORMAL LOW (ref 22–32)

## 2018-12-18 LAB — CBC WITH DIFFERENTIAL/PLATELET
Abs Immature Granulocytes: 0.02 10*3/uL (ref 0.00–0.07)
Basophils Absolute: 0 10*3/uL (ref 0.0–0.1)
Basophils Relative: 0 %
Eosinophils Absolute: 0.1 10*3/uL (ref 0.0–0.5)
Eosinophils Relative: 2 %
HCT: 43.5 % (ref 36.0–46.0)
Hemoglobin: 14.6 g/dL (ref 12.0–15.0)
Immature Granulocytes: 0 %
Lymphocytes Relative: 40 %
Lymphs Abs: 2.9 10*3/uL (ref 0.7–4.0)
MCH: 31.6 pg (ref 26.0–34.0)
MCHC: 33.6 g/dL (ref 30.0–36.0)
MCV: 94.2 fL (ref 80.0–100.0)
Monocytes Absolute: 0.4 10*3/uL (ref 0.1–1.0)
Monocytes Relative: 5 %
Neutro Abs: 3.8 10*3/uL (ref 1.7–7.7)
Neutrophils Relative %: 53 %
Platelets: 514 10*3/uL — ABNORMAL HIGH (ref 150–400)
RBC: 4.62 MIL/uL (ref 3.87–5.11)
RDW: 11.9 % (ref 11.5–15.5)
WBC: 7.2 10*3/uL (ref 4.0–10.5)
nRBC: 0 % (ref 0.0–0.2)

## 2018-12-18 LAB — BASIC METABOLIC PANEL
Anion gap: 16 — ABNORMAL HIGH (ref 5–15)
BUN: 8 mg/dL (ref 6–20)
CO2: 17 mmol/L — ABNORMAL LOW (ref 22–32)
Calcium: 8.9 mg/dL (ref 8.9–10.3)
Chloride: 107 mmol/L (ref 98–111)
Creatinine, Ser: 0.6 mg/dL (ref 0.44–1.00)
GFR calc Af Amer: >60 mL/min (ref >60)
GFR calc non Af Amer: >60 mL/min (ref >60)
Glucose, Bld: 93 mg/dL (ref 70–99)
Potassium: 3.4 mmol/L — ABNORMAL LOW (ref 3.5–5.1)
Sodium: 140 mmol/L (ref 135–145)

## 2018-12-18 LAB — HEPATIC FUNCTION PANEL
ALT: 67 U/L — ABNORMAL HIGH (ref 0–44)
AST: 33 U/L (ref 15–41)
Albumin: 4 g/dL (ref 3.5–5.0)
Alkaline Phosphatase: 67 U/L (ref 38–126)
Bilirubin, Direct: <0.1 mg/dL (ref 0.0–0.2)
Total Bilirubin: 0.6 mg/dL (ref 0.3–1.2)
Total Protein: 7.5 g/dL (ref 6.5–8.1)

## 2018-12-18 LAB — POC OCCULT BLOOD, ED: Fecal Occult Bld: POSITIVE — AB

## 2018-12-18 LAB — TROPONIN I (HIGH SENSITIVITY): Troponin I (High Sensitivity): 3 ng/L (ref <18)

## 2018-12-18 LAB — LIPASE, BLOOD: Lipase: 20 U/L (ref 11–51)

## 2018-12-18 LAB — PROTIME-INR
INR: 1 (ref 0.8–1.2)
Prothrombin Time: 13.3 seconds (ref 11.4–15.2)

## 2018-12-18 MED ORDER — ONDANSETRON 4 MG PO TBDP: ORAL_TABLET | ORAL | 0 refills | Status: DC

## 2018-12-18 MED ORDER — LORAZEPAM 2 MG/ML IJ SOLN
0.5000 mg | Freq: Once | INTRAMUSCULAR | Status: AC
  Administered 2018-12-18: 0.5 mg via INTRAVENOUS
  Filled 2018-12-18: qty 1

## 2018-12-18 MED ORDER — SODIUM CHLORIDE 0.9 % IV BOLUS
500.0000 mL | Freq: Once | INTRAVENOUS | Status: AC
  Administered 2018-12-18: 500 mL via INTRAVENOUS

## 2018-12-18 MED ORDER — HYDROCODONE-ACETAMINOPHEN 5-325 MG PO TABS: 1.0000 | ORAL_TABLET | Freq: Four times a day (QID) | ORAL | 0 refills | Status: DC | PRN

## 2018-12-18 MED ORDER — LORAZEPAM 2 MG/ML IJ SOLN: 1.0000 mg | Freq: Once | INTRAMUSCULAR | Status: DC

## 2018-12-18 MED ORDER — PANTOPRAZOLE SODIUM 40 MG IV SOLR
40.0000 mg | Freq: Once | INTRAVENOUS | Status: AC
  Administered 2018-12-18: 40 mg via INTRAVENOUS
  Filled 2018-12-18: qty 40

## 2018-12-18 MED ORDER — HYDROMORPHONE HCL 1 MG/ML IJ SOLN
0.5000 mg | Freq: Once | INTRAMUSCULAR | Status: AC
  Administered 2018-12-18: 0.5 mg via INTRAVENOUS
  Filled 2018-12-18: qty 1
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
## 2018-12-18 NOTE — ED Notes (Signed)
IV arrived at bedside

## 2018-12-18 NOTE — ED Notes (Signed)
Wasted 1.5mg  ativan with Benjamine Mola, RN
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
## 2018-12-18 NOTE — Discharge Instructions (Signed)
Take liquids for the next 24 hours then advance your diet soft foods.  Increase your Prilosec to take twice a day. follow-up with your GI doctor this week.  Give them a call Monday to make an appointment.  If they cannot see you this week then follow-up with your family doctor

## 2018-12-18 NOTE — ED Notes (Signed)
Patient transported to X-ray 

## 2018-12-18 NOTE — ED Provider Notes (Signed)
[facility] Provider Note   CSN: [id number] Arrival date & time: 12/18/18  1824     History   Chief Complaint Chief Complaint  Patient presents with  . Panic Attack    HPI Marisa [name] is a 43 y.o. female.     Patient complains abdominal pain.  Patient states she is thrown up a few times some black material.  No blood or black stools.  Patient had similar symptoms 3 months ago she had a CT chest angio she had a CT abdomen and she had upper endoscopy done all 3 of these tests were completely normal.  The history is provided by the patient. No language interpreter was used.  Abdominal Pain Pain location:  Epigastric Pain quality: aching   Pain radiates to:  Does not radiate Pain severity:  Moderate Timing:  Constant Progression:  Worsening Chronicity:  Recurrent Context: not alcohol use   Relieved by:  None tried Associated symptoms: no chest pain, no cough, no diarrhea, no fatigue and no hematuria     Past Medical History:  Diagnosis Date  . Abdominal pain 04/26/2013  . Abnormal uterine bleeding (AUB) 10/11/2012  . Acute bronchitis   . Allergy   . Anal pain    chronic  . Anemia   . Anxiety   . Asthma    exacerbation 02-28-2014 and 02-23-2014 secondary to Rhinovirus  . Atypical chest pain 04/26/2013  . Benign neoplasm of sigmoid colon   . Benign neoplasm of transverse colon   . Carbuncle of labium 07/12/2015  . Chest pain 02/20/2014  . Chronic diarrhea   . Chronic headaches   . Chronic low back pain   . Cigarette nicotine dependence without complication [id number]  . Cyst of right ovary   . Dandruff 03/26/2015  . Depression 03/27/2014  . Diabetes mellitus without complication ([place]) [id number]  . Difficult intravenous access    PER PT NEEDS PICC LINE  . Dyspnea 09/07/2012   [name] 08/2012:  No obstruction by FEV1%, but probable restriction.    . Falls 03/27/2014  . GAD (generalized anxiety disorder) 05/04/2014  . Gait  disturbance 04/11/2014  . Gait instability   . GERD (gastroesophageal reflux disease)   . History of adenomatous polyp of colon   . History of cardiac arrest    during SVD 1992  . History of ectopic pregnancy    2009-  S/P LEFT SALPINGECTOMY  . History of panic attacks   . IBS (irritable bowel syndrome)   . Insomnia 03/06/2014  . Lumbar stenosis L4 -- L5 with bulging disk   w/ right leg weakness/ decreased mobility  . Migraine variant with headache 05/09/2014  . Mild obstructive sleep apnea    study 03-20-2014  no cpap recommended  . Neuromuscular disorder (HCC)    neuropathy in feet   . Neuropathic pain of both legs 06/04/2016  . Obesity (BMI 30-39.9) 03/05/2018  . OSA (obstructive sleep apnea) 03/06/2014  . 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  . Panniculitis 03/05/2018  . Pelvic pain 07/25/2013  . Persistent vomiting 04/27/2013  . PTSD (post-traumatic stress disorder) 05/04/2014  . Rash and nonspecific skin eruption 07/12/2015  . Rectal bleeding 07/25/2013  . RLQ abdominal pain   . S/P Total vaginal hysterectomy on 01/06/13 01/06/2013  . Severe recurrent major depressive disorder with psychotic features ([place]) 05/04/2014  . Sleep apnea    mild no cpap  . Social anxiety disorder 05/04/2014  . Sore throat 02/20/2014  . Tachycardia 02/20/2014  .  Type 2 diabetes mellitus ([place])   . Weakness of right leg    FROM BACK PROBLEM PER PT    Patient Active Problem List   Diagnosis Date Noted  . Obesity (BMI 30-39.9) 03/05/2018  . Panniculitis 03/05/2018  . RLQ abdominal pain   . Chronic diarrhea   . Benign neoplasm of transverse colon   . Benign neoplasm of sigmoid colon   . Neuropathic pain of both legs 06/04/2016  . Carbuncle of labium 07/12/2015  . Rash and nonspecific skin eruption 07/12/2015  . Dandruff 03/26/2015  . Nipple discharge in female 03/26/2015  . Migraine variant with headache 05/09/2014  . Unable to ambulate 05/09/2014  . Severe recurrent major depressive disorder with  psychotic features ([place]) 05/04/2014  . GAD (generalized anxiety disorder) 05/04/2014  . 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  . Social anxiety disorder 05/04/2014  . PTSD (post-traumatic stress disorder) 05/04/2014  . Cigarette nicotine dependence without complication [id number]  . Gait disturbance 04/11/2014  . Depression 03/27/2014  . Falls 03/27/2014  . OSA (obstructive sleep apnea) 03/06/2014  . Insomnia 03/06/2014  . Anxiety   . History of cardiac arrest   . Acute bronchitis   . Asthma exacerbation 02/20/2014  . Well controlled type 2 diabetes mellitus ([place]) 02/20/2014  . Sore throat 02/20/2014  . Chest pain 02/20/2014  . Tachycardia 02/20/2014  . Diabetes mellitus without complication ([place]) [id number]  . Pelvic pain 07/25/2013  . Rectal bleeding 07/25/2013  . Persistent vomiting 04/27/2013  . Atypical chest pain 04/26/2013  . Abdominal pain 04/26/2013  . S/P Total vaginal hysterectomy on 01/06/13 01/06/2013  . Dyspnea 09/07/2012  . 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  . Anemia 07/30/2012  . Current smoker 07/30/2012    Past Surgical History:  Procedure Laterality Date  . ABDOMINAL HYSTERECTOMY    . COLONOSCOPY Left 04/29/2013   Procedure: COLONOSCOPY;  Surgeon: Arta Silence, MD;  Location: [facility];  Service: Endoscopy;  Laterality: Left;  . COLONOSCOPY    . COLONOSCOPY WITH PROPOFOL N/A 08/01/2016   Procedure: COLONOSCOPY WITH PROPOFOL;  Surgeon: Doran Stabler, MD;  Location: [facility];  Service: Gastroenterology;  Laterality: N/A;  . ESOPHAGOGASTRODUODENOSCOPY (EGD) WITH PROPOFOL N/A 11/10/2018   Procedure: ESOPHAGOGASTRODUODENOSCOPY (EGD) WITH PROPOFOL;  Surgeon: Doran Stabler, MD;  Location: [facility];  Service: Gastroenterology;  Laterality: N/A;  . EVALUATION UNDER ANESTHESIA WITH FISTULECTOMY N/A 04/20/2014   Procedure: EXAM UNDER ANESTHESIA ;  Surgeon: Leighton Ruff, MD;  Location: [facility];  Service: General;  Laterality: N/A;   . FLEXIBLE SIGMOIDOSCOPY N/A 11/09/2013   Procedure: FLEXIBLE SIGMOIDOSCOPY;  Surgeon: Arta Silence, MD;  Location: [facility];  Service: Endoscopy;  Laterality: N/A;  . fupa removal     . LAPAROSCOPIC CHOLECYSTECTOMY  2005  . SPHINCTEROTOMY N/A 04/20/2014   Procedure:  LATERAL INTERNAL SPHINCTEROTOMY;  Surgeon: Leighton Ruff, MD;  Location[facility];  Service: General;  Laterality: N/A;  . TRANSTHORACIC ECHOCARDIOGRAM  12-30-2012   mild LVH/  ef 55-60%  . UNILATERAL SALPINGECTOMY  2009   laparotomy left salpingectomy-- ectopic preg.  [facility] Kitchen UPPER GASTROINTESTINAL ENDOSCOPY    . VAGINAL HYSTERECTOMY N/A 01/06/2013   Procedure: HYSTERECTOMY VAGINAL;  Surgeon: Osborne Oman, MD;  Location: [facility];  Service: Gynecology;  Laterality: N/A;     OB History    Gravida  3   Para  1   Term  1   Preterm      AB  2   Living  1  SAB      TAB  1   Ectopic  1   Multiple      Live Births               Home Medications    Prior to Admission medications   Medication Sig Start Date End Date Taking? Authorizing Provider  albuterol (PROVENTIL) (2.5 MG/3ML) 0.083% nebulizer solution Take 3 mLs (2.5 mg total) by nebulization every 6 (six) hours as needed for wheezing or shortness of breath. 10/20/18  Yes Fulp, Cammie, MD  albuterol (VENTOLIN HFA) 108 (90 Base) MCG/ACT inhaler Inhale 2 puffs into the lungs every 6 (six) hours as needed for wheezing or shortness of breath. 11/12/18  Yes Fulp, Cammie, MD  dicyclomine (BENTYL) 10 MG capsule Take 1 capsule (10 mg total) by mouth 3 (three) times daily before meals. 10/14/18  Yes Danis, Kirke Corin, MD  fenofibrate (TRICOR) 48 MG tablet Take 1 tablet (48 mg total) by mouth daily. 10/21/18  Yes Fulp, Cammie, MD  gabapentin (NEURONTIN) 300 MG capsule Take 1 capsule (300 mg total) by mouth 3 (three) times daily. 12/29/17  Yes Fulp, Cammie, MD  hydrOXYzine (ATARAX/VISTARIL) 10 MG tablet One or two pills every 6-8 hours as needed  for acute anxiety Patient taking differently: Take 10-20 mg by mouth every 6 (six) hours as needed for anxiety.  11/12/18  Yes Fulp, Cammie, MD  metFORMIN (GLUCOPHAGE) 500 MG tablet Take 1 tablet (500 mg total) by mouth 2 (two) times daily with a meal. 10/20/18  Yes Fulp, Cammie, MD  mometasone-formoterol (DULERA) 100-5 MCG/ACT AERO Inhale 2 puffs into the lungs 2 (two) times daily. Patient taking differently: Inhale 2 puffs into the lungs every other day.  12/17/16  Yes Tresa Garter, MD  omeprazole (PRILOSEC) 40 MG capsule Take 1 capsule (40 mg total) by mouth daily. 10/20/18  Yes Fulp, Cammie, MD  promethazine (PHENERGAN) 25 MG tablet Take 1 tablet (25 mg total) by mouth every 8 (eight) hours as needed for nausea or vomiting. 11/02/18  Yes Trula Slade, DPM  sertraline (ZOLOFT) 50 MG tablet Take 1 tablet (50 mg total) by mouth at bedtime. Start with 1/2 pill daily for 7 days then increase to 1 pill 11/12/18  Yes Fulp, Cammie, MD  traZODone (DESYREL) 50 MG tablet Take 0.5-1 tablets (25-50 mg total) by mouth at bedtime as needed for sleep. 11/12/18  Yes Fulp, Cammie, MD  albuterol (VENTOLIN HFA) 108 (90 Base) MCG/ACT inhaler Inhale 1-2 puffs into the lungs every 6 (six) hours as needed for wheezing or shortness of breath. Patient not taking: Reported on 12/18/2018 10/21/18   Fulp, Ander Gaster, MD  ibuprofen (ADVIL) 800 MG tablet Take 1 tablet (800 mg total) by mouth every 8 (eight) hours as needed. Patient not taking: Reported on 11/12/2018 11/12/18   Felipa Furnace, DPM    Family History Family History  Problem Relation Age of Onset  . Hypertension Mother   . Diabetes Mother   . Allergies Mother   . Heart disease Mother   . Clotting disorder Mother   . Cancer Father   . Hyperlipidemia Father   . Hypertension Father   . Colon cancer Father   . Heart disease Maternal Grandmother   . Breast cancer Maternal Grandmother 88  . Schizophrenia Sister   . Bipolar disorder Sister   . Clotting  disorder Sister   . Bipolar disorder Brother   . Kidney disease Brother   . Bipolar disorder Sister   .  Pancreatic cancer Maternal Aunt   . Prostate cancer Maternal Uncle   . Liver cancer Maternal Grandfather   . Rectal cancer Maternal Grandfather   . Liver cancer Paternal Grandfather   . Colon polyps Neg Hx   . Esophageal cancer Neg Hx   . Stomach cancer Neg Hx     Social History Social History   Tobacco Use  . Smoking status: Former Smoker    Packs/day: 0.20    Years: 11.00    Pack years: 2.20    Types: Cigarettes    Quit date: 11/17/2013    Years since quitting: 5.0  . Smokeless tobacco: Never Used  . Tobacco comment: 2 years quit  Substance Use Topics  . Alcohol use: No    Alcohol/week: 0.0 standard drinks  . Drug use: No     Allergies   Asa [aspirin], Mushroom extract complex, Penicillins, Shellfish allergy, and Triamcinolone   Review of Systems Review of Systems  Constitutional: Negative for appetite change and fatigue.  HENT: Negative for congestion, ear discharge and sinus pressure.   Eyes: Negative for discharge.  Respiratory: Negative for cough.   Cardiovascular: Negative for chest pain.  Gastrointestinal: Positive for abdominal pain. Negative for diarrhea.  Genitourinary: Negative for frequency and hematuria.  Musculoskeletal: Negative for back pain.  Skin: Negative for rash.  Neurological: Negative for seizures and headaches.  Psychiatric/Behavioral: Negative for hallucinations.     Physical Exam Updated Vital Signs BP (!) 133/100 (BP Location: Right Arm)   Pulse 99   Resp (!) 27   LMP 11/27/2012   SpO2 100%   Physical Exam Vitals signs and nursing note reviewed.  Constitutional:      Appearance: She is well-developed.  HENT:     Head: Normocephalic.     Nose: Nose normal.  Eyes:     General: No scleral icterus.    Conjunctiva/sclera: Conjunctivae normal.  Neck:     Musculoskeletal: Neck supple.     Thyroid: No thyromegaly.   Cardiovascular:     Rate and Rhythm: Normal rate and regular rhythm.     Heart sounds: No murmur. No friction rub. No gallop.   Pulmonary:     Breath sounds: No stridor. No wheezing or rales.  Chest:     Chest wall: No tenderness.  Abdominal:     General: There is no distension.     Tenderness: There is abdominal tenderness. There is no rebound.  Musculoskeletal: Normal range of motion.  Lymphadenopathy:     Cervical: No cervical adenopathy.  Skin:    Findings: No erythema or rash.  Neurological:     Mental Status: She is alert and oriented to person, place, and time.     Motor: No abnormal muscle tone.     Coordination: Coordination normal.  Psychiatric:        Behavior: Behavior normal.      ED Treatments / Results  Labs (all labs ordered are listed, but only abnormal results are displayed) Labs Reviewed  POC OCCULT BLOOD, ED - Abnormal; Notable for the following components:      Result Value   Fecal Occult Bld POSITIVE (*)    All other components within normal limits  I-STAT CHEM 8, ED - Abnormal; Notable for the following components:   Calcium, Ion 1.11 (*)    TCO2 19 (*)    All other components within normal limits  CBC WITH DIFFERENTIAL/PLATELET  BASIC METABOLIC PANEL  PROTIME-INR  LIPASE, BLOOD  HEPATIC FUNCTION PANEL  TROPONIN  I (HIGH SENSITIVITY)  TROPONIN I (HIGH SENSITIVITY)    EKG None  Radiology Dg Chest Portable 1 View  Result Date: 12/18/2018 CLINICAL DATA:  Chest pain.  Panic attack. EXAM: PORTABLE CHEST 1 VIEW COMPARISON:  09/22/2018 FINDINGS: The heart size and mediastinal contours are within normal limits. Both lungs are clear. The visualized skeletal structures are unremarkable. IMPRESSION: No active disease. Electronically Signed   By: Marlaine Hind M.D.   On: 12/18/2018 19:17    Procedures Procedures (including critical care time)  Medications Ordered in ED Medications  HYDROmorphone (DILAUDID) injection 0.5 mg (has no administration  in time range)  LORazepam (ATIVAN) injection 0.5 mg (has no administration in time range)  sodium chloride 0.9 % bolus 500 mL (500 mLs Intravenous New Bag/Given 12/18/18 2024)  pantoprazole (PROTONIX) injection 40 mg (40 mg Intravenous Given 12/18/18 2026)     Initial Impression / Assessment and Plan / ED Course  I have reviewed the triage vital signs and the nursing notes.  Pertinent labs & imaging results that were available during my care of the patient were reviewed by me and considered in my medical decision making (see chart for details).        Patient with epigastric tenderness.  She is given Protonix pain medicine nausea medicine.  Labs pending.  Suspect gastritis and anxiety.  Labs all unremarkable.  Patient having symptoms from significant stress.  She will be discharged home with some pain and nausea medicine and follow-up with GI Final Clinical Impressions(s) / ED Diagnoses   Final diagnoses:  None    ED Discharge Orders    None       Milton Ferguson, MD 12/18/18 2118

## 2018-12-18 NOTE — ED Notes (Signed)
Patient verbalizes understanding of discharge instructions. Opportunity for questioning and answers were provided. pt discharged from ED with family.   

## 2018-12-23 ENCOUNTER — Ambulatory Visit: Payer: Medicare Other | Admitting: Licensed Clinical Social Worker

## 2019-01-03 ENCOUNTER — Telehealth: Payer: Self-pay | Admitting: Podiatry

## 2019-01-03 NOTE — Telephone Encounter (Signed)
DOS: 01/25/2019 SURGICAL PROCEDURE: Endoscopic Plantar Fasciotomy Med Band [id number])  Policy Effective Date: 04/18/2018 - 02/17/2019  Notification or Prior Authorization is not required for the requested services  This [facility] Advantage members plan does not currently require a prior authorization for these services. If you have general questions about the prior authorization requirements, please call us at [phone] or visit [email or web address] > Clinician Resources > Advance and Admission Notification Requirements. The number above acknowledges your notification. Please write this number down for future reference. Notification is not a guarantee of coverage or payment.  Decision ID [id number]  The number above acknowledges your inquiry and our response. Please write this number down and refer to it for future inquiries. Coverage and payment for an item or service is governed by the member's benefit plan document, and, if applicable, the provider's participation agreement with the Health Plan.

## 2019-01-06 ENCOUNTER — Ambulatory Visit: Payer: Medicare Other | Admitting: Licensed Clinical Social Worker

## 2019-01-18 ENCOUNTER — Telehealth: Payer: Self-pay | Admitting: *Deleted

## 2019-01-18 NOTE — Telephone Encounter (Addendum)
"  I'm calling to see when my surgery date is and where.  I'm a patient of Dr. Paulla Dolly."  You are scheduled for January 25, 2019.  Your surgery will be done at [facility].  You should have received the surgical center's brochure in your little surgery bag that we gave you.  "There was one in my bag but I'm in [place] right now." The address to the surgery center is [address]  You will get a call from someone from the surgical center the Friday or Monday prior to your surgery date and they will give you your arrival time.

## 2019-01-24 ENCOUNTER — Telehealth: Payer: Self-pay | Admitting: Podiatry

## 2019-01-24 NOTE — Telephone Encounter (Signed)
I called pt back and she stated she had already gotten the information she needed. I wished her luck with her surgery.

## 2019-01-24 NOTE — Telephone Encounter (Signed)
I am scheduled to have surgery tomorrow and I tend to forget things. Can you please call me with my surgery time and the location of my surgery. Thank you.

## 2019-01-25 ENCOUNTER — Telehealth: Payer: Self-pay | Admitting: Podiatry

## 2019-01-25 ENCOUNTER — Other Ambulatory Visit: Payer: Self-pay | Admitting: Podiatry

## 2019-01-25 ENCOUNTER — Telehealth: Payer: Self-pay | Admitting: *Deleted

## 2019-01-25 ENCOUNTER — Encounter: Payer: Self-pay | Admitting: Podiatry

## 2019-01-25 DIAGNOSIS — M722 Plantar fascial fibromatosis: Secondary | ICD-10-CM

## 2019-01-25 MED ORDER — HYDROCODONE-ACETAMINOPHEN 10-325 MG PO TABS: 1.0000 | ORAL_TABLET | Freq: Four times a day (QID) | ORAL | 0 refills | Status: AC | PRN

## 2019-01-25 NOTE — Telephone Encounter (Signed)
Dr. Paulla Dolly states pt's pharmacy will not take the hard copy of the hydrocodone 10/325mg  #25 one tablet every 4-6 hours prn pain, and he request doctor in office send to pt's pharmacy.

## 2019-01-25 NOTE — Telephone Encounter (Signed)
done

## 2019-01-25 NOTE — Telephone Encounter (Signed)
Previous message from Dr. Paulla Dolly routed to Dr. Jacqualyn Posey.

## 2019-01-25 NOTE — Telephone Encounter (Signed)
Pt had surgry today and her meds were suppose to be called in to pharmacy but has not been sent yet to [address] [employer]

## 2019-01-31 ENCOUNTER — Encounter: Payer: Medicare Other | Admitting: Podiatry

## 2019-02-03 ENCOUNTER — Encounter: Payer: Self-pay | Admitting: Podiatry

## 2019-02-03 ENCOUNTER — Ambulatory Visit (INDEPENDENT_AMBULATORY_CARE_PROVIDER_SITE_OTHER): Payer: Medicare Other | Admitting: Podiatry

## 2019-02-03 ENCOUNTER — Other Ambulatory Visit: Payer: Self-pay

## 2019-02-03 VITALS — BP 116/76 | HR 79 | Temp 98.1°F | Resp 16

## 2019-02-03 DIAGNOSIS — M722 Plantar fascial fibromatosis: Secondary | ICD-10-CM | POA: Diagnosis not present

## 2019-02-03 MED ORDER — HYDROCODONE-ACETAMINOPHEN 10-325 MG PO TABS: 1.0000 | ORAL_TABLET | Freq: Three times a day (TID) | ORAL | 0 refills | Status: AC | PRN

## 2019-02-03 MED ORDER — ONDANSETRON HCL 4 MG PO TABS: 4.0000 mg | ORAL_TABLET | Freq: Three times a day (TID) | ORAL | 0 refills | Status: DC | PRN

## 2019-02-03 MED ORDER — IBUPROFEN 600 MG PO TABS: 600.0000 mg | ORAL_TABLET | Freq: Three times a day (TID) | ORAL | 0 refills | Status: DC | PRN

## 2019-02-04 NOTE — Progress Notes (Signed)
Subjective:   Patient ID: Marisa [name], female   DOB: 43 y.o.   MRN: [id number]   HPI Patient presents stating that my foot is improving and I am still having quite a bit of pain but better in the right 1 seems to be doing pretty well   ROS      Objective:  Physical Exam  Neurovascular status intact left negative Bevelyn Buckles' sign noted wound edges well coapted minimal discomfort in the heel but quite a bit of discomfort in the arch currently     Assessment:  Improvement from endoscopic surgery left with pain still present normal for this.  Postop     Plan:  H&P and reviewed condition.  I recommended continued boot usage and surgical shoe usage to began along with ankle compression stocking and elevation immobilization.  Patient is prescribed a combination of Vicodin ibuprofen and Zofran for the next several weeks and just takes them sparingly

## 2019-02-14 ENCOUNTER — Encounter: Payer: Self-pay | Admitting: Podiatry

## 2019-02-14 ENCOUNTER — Other Ambulatory Visit: Payer: Self-pay

## 2019-02-14 ENCOUNTER — Ambulatory Visit (INDEPENDENT_AMBULATORY_CARE_PROVIDER_SITE_OTHER): Payer: Medicare Other | Admitting: Podiatry

## 2019-02-14 DIAGNOSIS — M722 Plantar fascial fibromatosis: Secondary | ICD-10-CM | POA: Diagnosis not present

## 2019-02-15 NOTE — Progress Notes (Signed)
Subjective:   Patient ID: Marisa [name], female   DOB: 43 y.o.   MRN: [id number]   HPI Patient states overall doing well with minimal discomfort and states she is starting to improve and is ready for suture removal   ROS      Objective:  Physical Exam  Neuro vascular status intact negative Bevelyn Buckles' sign noted with no current redness around the medial and lateral incision site with no drainage sutures in place with one having popped out due to activity     Assessment:  Overall doing well with no indications of localized infective process and having completed antibiotics     Plan:  Suture removal accomplished sterile dressings applied and applied ankle compression stocking and advised on gradual return to soft shoes with continued boot surgical shoe usage as needed.  Reappoint as needed to recheck

## 2019-02-19 DIAGNOSIS — J4520 Mild intermittent asthma, uncomplicated: Secondary | ICD-10-CM | POA: Diagnosis not present

## 2019-03-03 ENCOUNTER — Encounter (INDEPENDENT_AMBULATORY_CARE_PROVIDER_SITE_OTHER): Payer: Self-pay | Admitting: Family Medicine

## 2019-03-03 ENCOUNTER — Other Ambulatory Visit: Payer: Self-pay

## 2019-03-03 ENCOUNTER — Ambulatory Visit (INDEPENDENT_AMBULATORY_CARE_PROVIDER_SITE_OTHER): Payer: Medicare Other | Admitting: Family Medicine

## 2019-03-03 VITALS — BP 120/81 | HR 74 | Temp 98.1°F | Ht 65.0 in | Wt 212.0 lb

## 2019-03-03 DIAGNOSIS — Z6835 Body mass index (BMI) 35.0-35.9, adult: Secondary | ICD-10-CM | POA: Diagnosis not present

## 2019-03-03 DIAGNOSIS — E7849 Other hyperlipidemia: Secondary | ICD-10-CM

## 2019-03-03 DIAGNOSIS — E119 Type 2 diabetes mellitus without complications: Secondary | ICD-10-CM | POA: Diagnosis not present

## 2019-03-03 DIAGNOSIS — R5383 Other fatigue: Secondary | ICD-10-CM

## 2019-03-03 DIAGNOSIS — R0602 Shortness of breath: Secondary | ICD-10-CM | POA: Diagnosis not present

## 2019-03-03 DIAGNOSIS — J45909 Unspecified asthma, uncomplicated: Secondary | ICD-10-CM

## 2019-03-03 DIAGNOSIS — Z9189 Other specified personal risk factors, not elsewhere classified: Secondary | ICD-10-CM

## 2019-03-03 DIAGNOSIS — Z1331 Encounter for screening for depression: Secondary | ICD-10-CM | POA: Diagnosis not present

## 2019-03-03 DIAGNOSIS — Z0289 Encounter for other administrative examinations: Secondary | ICD-10-CM

## 2019-03-03 DIAGNOSIS — R7989 Other specified abnormal findings of blood chemistry: Secondary | ICD-10-CM | POA: Diagnosis not present

## 2019-03-04 LAB — COMPREHENSIVE METABOLIC PANEL
ALT: 22 IU/L (ref 0–32)
AST: 20 IU/L (ref 0–40)
Albumin/Globulin Ratio: 1.4 (ref 1.2–2.2)
Albumin: 4.6 g/dL (ref 3.8–4.8)
Alkaline Phosphatase: 100 IU/L (ref 39–117)
BUN/Creatinine Ratio: 21 (ref 9–23)
BUN: 10 mg/dL (ref 6–24)
Bilirubin Total: 0.5 mg/dL (ref 0.0–1.2)
CO2: 22 mmol/L (ref 20–29)
Calcium: 9.6 mg/dL (ref 8.7–10.2)
Chloride: 101 mmol/L (ref 96–106)
Creatinine, Ser: 0.48 mg/dL — ABNORMAL LOW (ref 0.57–1.00)
GFR calc Af Amer: 138 mL/min/{1.73_m2} (ref >59)
GFR calc non Af Amer: 120 mL/min/{1.73_m2} (ref >59)
Globulin, Total: 3.2 g/dL (ref 1.5–4.5)
Glucose: 85 mg/dL (ref 65–99)
Potassium: 4.3 mmol/L (ref 3.5–5.2)
Sodium: 140 mmol/L (ref 134–144)
Total Protein: 7.8 g/dL (ref 6.0–8.5)

## 2019-03-04 LAB — TSH: TSH: 0.74 u[IU]/mL (ref 0.450–4.500)

## 2019-03-04 LAB — MICROALBUMIN / CREATININE URINE RATIO
Creatinine, Urine: 188.8 mg/dL
Microalb/Creat Ratio: 14 mg/g creat (ref 0–29)
Microalbumin, Urine: 27.3 ug/mL

## 2019-03-04 LAB — CBC WITH DIFFERENTIAL/PLATELET
Basophils Absolute: 0 10*3/uL (ref 0.0–0.2)
Basos: 1 %
EOS (ABSOLUTE): 0 10*3/uL (ref 0.0–0.4)
Eos: 1 %
Hematocrit: 43.2 % (ref 34.0–46.6)
Hemoglobin: 14.2 g/dL (ref 11.1–15.9)
Immature Grans (Abs): 0 10*3/uL (ref 0.0–0.1)
Immature Granulocytes: 0 %
Lymphocytes Absolute: 2.4 10*3/uL (ref 0.7–3.1)
Lymphs: 41 %
MCH: 31.5 pg (ref 26.6–33.0)
MCHC: 32.9 g/dL (ref 31.5–35.7)
MCV: 96 fL (ref 79–97)
Monocytes Absolute: 0.3 10*3/uL (ref 0.1–0.9)
Monocytes: 6 %
Neutrophils Absolute: 3 10*3/uL (ref 1.4–7.0)
Neutrophils: 51 %
Platelets: 433 10*3/uL (ref 150–450)
RBC: 4.51 x10E6/uL (ref 3.77–5.28)
RDW: 12.7 % (ref 11.7–15.4)
WBC: 5.9 10*3/uL (ref 3.4–10.8)

## 2019-03-04 LAB — FOLATE: Folate: 13 ng/mL (ref >3.0)

## 2019-03-04 LAB — T4, FREE: Free T4: 1.27 ng/dL (ref 0.82–1.77)

## 2019-03-04 LAB — VITAMIN B12: Vitamin B-12: 567 pg/mL (ref 232–1245)

## 2019-03-04 LAB — LIPID PANEL WITH LDL/HDL RATIO
Cholesterol, Total: 186 mg/dL (ref 100–199)
HDL: 41 mg/dL (ref >39)
LDL Chol Calc (NIH): 98 mg/dL (ref 0–99)
LDL/HDL Ratio: 2.4 ratio (ref 0.0–3.2)
Triglycerides: 276 mg/dL — ABNORMAL HIGH (ref 0–149)
VLDL Cholesterol Cal: 47 mg/dL — ABNORMAL HIGH (ref 5–40)

## 2019-03-04 LAB — HEMOGLOBIN A1C
Est. average glucose Bld gHb Est-mCnc: 114 mg/dL
Hgb A1c MFr Bld: 5.6 % (ref 4.8–5.6)

## 2019-03-04 LAB — VITAMIN D 25 HYDROXY (VIT D DEFICIENCY, FRACTURES): Vit D, 25-Hydroxy: 13.2 ng/mL — ABNORMAL LOW (ref 30.0–100.0)

## 2019-03-04 LAB — T3: T3, Total: 178 ng/dL (ref 71–180)

## 2019-03-04 LAB — INSULIN, RANDOM: INSULIN: 12.7 u[IU]/mL (ref 2.6–24.9)

## 2019-03-08 NOTE — Progress Notes (Signed)
Dear Marisa Blackbird, MD,   Thank you for referring Marisa [name] to our clinic. The following note includes my evaluation and treatment recommendations.  Chief Complaint:   Marisa [name] (MR# [id number]) is a 44 y.o. female who presents for evaluation and treatment of Marisa and related comorbidities. Current BMI is Body mass index is 35.28 kg/m. Marisa [name] has been struggling with her weight for many years and has been unsuccessful in either losing weight, maintaining weight loss, or reaching her healthy weight goal.  Marisa [name] is currently in the action stage of change and ready to dedicate time achieving and maintaining a healthier weight. Marisa [name] is interested in becoming our patient and working on intensive lifestyle modifications including (but not limited to) diet and exercise for weight loss.  Marisa [name]'s habits were reviewed today and are as follows: Her family eats meals together, she thinks her family will eat healthier with her, her desired weight loss is 62 lbs, she started gaining weight at 44 years old, her heaviest weight ever was 270 pounds, she is a picky eater and doesn't like to eat healthier foods, she has significant food cravings issues, she snacks frequently in the evenings, she wakes up frequently in the middle of the night to eat, she skips meals frequently, she is frequently drinking liquids with calories, she frequently makes poor food choices, she has problems with excessive hunger, she frequently eats larger portions than normal, she has binge eating behaviors and she struggles with emotional eating.  Depression Screen Marisa [name]'s Food and Mood (modified PHQ-9) score was 14.  Depression screen PHQ 2/9 03/03/2019  Decreased Interest 2  Down, Depressed, Hopeless 3  PHQ - 2 Score 5  Altered sleeping 2  Tired, decreased energy 2  Change in appetite 2  Feeling bad or failure about yourself  2  Trouble concentrating 1  Moving slowly or  fidgety/restless 0  Suicidal thoughts 0  PHQ-9 Score 14  Difficult doing work/chores Very difficult  Some recent data might be hidden   Subjective:   1. Other fatigue Marisa [name] admits to daytime somnolence and admits to waking up still tired. Patent has a history of symptoms of daytime fatigue and morning headache. Marisa [name] generally gets 5 hours of sleep per night, and states that she generally has nightime awakenings. Snoring is present. Apneic episodes are present. Epworth Sleepiness Score is 8.  2. SOB (shortness of breath) on exertion Marisa [name] notes increasing shortness of breath with exercising and seems to be worsening over time with weight gain. She notes getting out of breath sooner with activity than she used to. This has not gotten worse recently. Marisa [name] denies shortness of breath at rest or orthopnea.  3. Type 2 diabetes mellitus without complication, without long-term current use of insulin (HCC) Marisa [name] is on metformin. Her last A1c was at 5.5, and she is not checking her blood sugars at home.  4. Other hyperlipidemia Marisa [name] is not on statin. She has a diagnosis of diabetes mellitus, and she is attempting to diet control.  5. Uncomplicated asthma, unspecified asthma severity, unspecified whether persistent Marisa [name] is on a albuterol inhaler, approximately 3 times a day and it is often worse in the summer. She is not on maintenance medications.  6. At risk for heart disease Marisa [name] is at a higher than average risk for cardiovascular disease due to Marisa. Reviewed: no chest pain on exertion, no dyspnea on exertion, and no swelling of ankles.  Assessment/Plan:   1. Other fatigue Marisa [name] does  feel that her weight is causing her energy to be lower than it should be. Fatigue may be related to Marisa, depression or many other causes. Labs will be ordered, and in the meanwhile, Marisa [name] will focus on self care including making healthy food choices, increasing physical  activity and focusing on stress reduction.  - EKG 12-Lead - Vitamin D (25 hydroxy) - B12 - Folate - T3 - T4, free - TSH  2. SOB (shortness of breath) on exertion Marisa [name] does feel that she gets out of breath more easily that she used to when she exercises. Marisa [name]'s shortness of breath appears to be Marisa related and exercise induced. She has agreed to work on weight loss and gradually increase exercise to treat her exercise induced shortness of breath. Will continue to monitor closely.  - Vitamin D (25 hydroxy) - B12 - Folate - T3 - T4, free - TSH  3. Type 2 diabetes mellitus without complication, without long-term current use of insulin (HCC) Good blood sugar control is important to decrease the likelihood of diabetic complications such as nephropathy, neuropathy, limb loss, blindness, coronary artery disease, and death. Intensive lifestyle modification including diet, exercise and weight loss are the first line of treatment for diabetes. We will check labs today and follow up. Marisa [name] will start her diet and continue taking metformin.   - Comprehensive Metabolic Panel (CMET) - CBC w/Diff/Platelet - HgB A1c - Insulin, random  4. Other hyperlipidemia Cardiovascular risk and specific lipid/LDL goals reviewed. We discussed several lifestyle modifications today and Marisa [name] will start her diet prescription, and will work on exercise and weight loss efforts.  We will check labs today. Orders and follow up as documented in patient record.   Counseling Intensive lifestyle modifications are the first line treatment for this issue. . Dietary changes: Increase soluble fiber. Decrease simple carbohydrates. . Exercise changes: Moderate to vigorous-intensity aerobic activity 150 minutes per week if tolerated. . Lipid-lowering medications: see documented in medical record.  - Lipid Panel With LDL/HDL Ratio  5. Uncomplicated asthma, unspecified asthma severity, unspecified whether  persistent Marisa [name] is to discuss maintenance medications with her primary care physician to help avoid exacerbations, and the need for weight gaining steroids.  6. Depression screening Marisa [name] had a positive depression screening. Depression is commonly associated with Marisa and often results in emotional eating behaviors. We will monitor this closely and work on CBT to help improve the non-hunger eating patterns. Referral to Psychology may be required if no improvement is seen as she continues in our clinic.  7. At risk for heart disease Marisa [name] was given approximately 30 minutes of coronary artery disease prevention counseling today. She is 44 y.o. female and has risk factors for heart disease including Marisa. We discussed intensive lifestyle modifications today with an emphasis on specific weight loss instructions and strategies.   Repetitive spaced learning was employed today to elicit superior memory formation and behavioral change.  8. Class 2 severe Marisa with serious comorbidity and body mass index (BMI) of 35.0 to 35.9 in adult, unspecified Marisa type (HCC) Marisa [name] is currently in the action stage of change and her goal is to continue with weight loss efforts. I recommend Marisa [name] begin the structured treatment plan as follows:  She has agreed to the Category 2 Plan.  Behavioral modification strategies: increasing lean protein intake and meal planning and cooking strategies.  She was informed of the importance of frequent follow-up visits to maximize her success with intensive lifestyle modifications for her multiple health conditions. She  was informed we would discuss her lab results at her next visit unless there is a critical issue that needs to be addressed sooner. Marisa [name] agreed to keep her next visit at the agreed upon time to discuss these results.  Objective:   Blood pressure 120/81, pulse 74, temperature 98.1 F (36.7 C), temperature source Oral, height 5\' 5"   (1.651 m), weight 212 lb (96.2 kg), last menstrual period 11/27/2012, SpO2 97 %. Body mass index is 35.28 kg/m.  EKG: Normal sinus rhythm, rate 83 BPM.  Indirect Calorimeter completed today shows a VO2 of 273 and a REE of 1905.  Her calculated basal metabolic rate is [id number] thus her basal metabolic rate is better than expected.  General: Cooperative, alert, well developed, in no acute distress. HEENT: Conjunctivae and lids unremarkable. Cardiovascular: Regular rhythm.  Lungs: Normal work of breathing. Neurologic: No focal deficits.   Lab Results  Component Value Date   CREATININE 0.48 (L) 03/03/2019   BUN 10 03/03/2019   NA 140 03/03/2019   K 4.3 03/03/2019   CL 101 03/03/2019   CO2 22 03/03/2019   Lab Results  Component Value Date   ALT 22 03/03/2019   AST 20 03/03/2019   ALKPHOS 100 03/03/2019   BILITOT 0.5 03/03/2019   Lab Results  Component Value Date   HGBA1C 5.6 03/03/2019   HGBA1C 5.5 10/20/2018   HGBA1C 5.4 12/29/2017   HGBA1C 5.9 (H) 04/08/2017   HGBA1C 6.2 12/17/2016   Lab Results  Component Value Date   INSULIN 12.7 03/03/2019   Lab Results  Component Value Date   TSH 0.740 03/03/2019   Lab Results  Component Value Date   CHOL 186 03/03/2019   HDL 41 03/03/2019   LDLCALC 98 03/03/2019   TRIG 276 (H) 03/03/2019   CHOLHDL 4.6 (H) 10/20/2018   Lab Results  Component Value Date   WBC 5.9 03/03/2019   HGB 14.2 03/03/2019   HCT 43.2 03/03/2019   MCV 96 03/03/2019   PLT 433 03/03/2019   No results found for: IRON, TIBC, FERRITIN  Attestation Statements:   Reviewed by clinician on day of visit: allergies, medications, problem list, medical history, surgical history, family history, social history, and previous encounter notes.   I, Trixie Dredge, am acting as transcriptionist for Dennard Nip, MD.  I have reviewed the above documentation for accuracy and completeness, and I agree with the above. - Dennard Nip, MD

## 2019-03-14 ENCOUNTER — Telehealth: Payer: Self-pay | Admitting: Family Medicine

## 2019-03-14 NOTE — Telephone Encounter (Signed)
Patient dropped off a form that was filled by a nurse that came to her home. Patient just wanted her PCP to have the form. Form will be placed at PCP box.

## 2019-03-17 ENCOUNTER — Other Ambulatory Visit: Payer: Self-pay

## 2019-03-17 ENCOUNTER — Ambulatory Visit (INDEPENDENT_AMBULATORY_CARE_PROVIDER_SITE_OTHER): Payer: Medicare Other | Admitting: Family Medicine

## 2019-03-17 ENCOUNTER — Encounter (INDEPENDENT_AMBULATORY_CARE_PROVIDER_SITE_OTHER): Payer: Self-pay | Admitting: Family Medicine

## 2019-03-17 VITALS — BP 115/80 | HR 82 | Temp 98.1°F | Ht 65.0 in | Wt 215.0 lb

## 2019-03-17 DIAGNOSIS — E119 Type 2 diabetes mellitus without complications: Secondary | ICD-10-CM

## 2019-03-17 DIAGNOSIS — E559 Vitamin D deficiency, unspecified: Secondary | ICD-10-CM | POA: Diagnosis not present

## 2019-03-17 DIAGNOSIS — E7849 Other hyperlipidemia: Secondary | ICD-10-CM | POA: Diagnosis not present

## 2019-03-17 DIAGNOSIS — Z9189 Other specified personal risk factors, not elsewhere classified: Secondary | ICD-10-CM | POA: Diagnosis not present

## 2019-03-17 DIAGNOSIS — Z6835 Body mass index (BMI) 35.0-35.9, adult: Secondary | ICD-10-CM

## 2019-03-17 MED ORDER — VITAMIN D (ERGOCALCIFEROL) 1.25 MG (50000 UNIT) PO CAPS: 50000.0000 [IU] | ORAL_CAPSULE | ORAL | 0 refills | Status: DC

## 2019-03-17 NOTE — Progress Notes (Signed)
Chief Complaint:   OBESITY Marisa [name] is here to discuss her progress with her obesity treatment plan along with follow-up of her obesity related diagnoses. Marisa [name] is on the Category 2 Plan and states she is following her eating plan approximately 80-85% of the time. Marisa [name] states she is doing 0 minutes 0 times per week.  Today's visit was #: 2 Starting weight: 212 lbs Starting date: 03/03/2019 Today's weight: 215 lbs Today's date: 03/17/2019 Total lbs lost to date: 0 Total lbs lost since last in-office visit: 0  Interim History: Marisa [name] states she did well on her plan, but struggled to eat all of her food at times. She often feels bloated which makes her plan more difficult.  Subjective:   1. Type 2 diabetes mellitus without complication, without long-term current use of insulin (HCC) Marisa [name]'s A1c was well controlled at 5.6, but her fasting insulin >5 and her triglycerides ar elevated suggesting she doesn't tolerate simple carbohydrates well. She notes some episodes of feeling hypoglycemic.  2. Other hyperlipidemia Marisa [name]'s triglycerides are elevated, and she is not on statin or fibrate. She is attempting to control with diet. I discussed labs with the patient today.  3. Vitamin D deficiency Marisa [name] has a new diagnosis of Vit D deficiency. She notes fatigue and she is not on Vit D yet. I discussed labs with the patient today.  4. At risk for hypoglycemia Marisa [name] is at increased risk for hypoglycemia due to changes in diet, diagnosis of diabetes, and/or insulin use. Marisa [name] is not currently taking insulin.   Assessment/Plan:   1. Type 2 diabetes mellitus without complication, without long-term current use of insulin (HCC) Good blood sugar control is important to decrease the likelihood of diabetic complications such as nephropathy, neuropathy, limb loss, blindness, coronary artery disease, and death. Intensive lifestyle modification including diet, exercise and weight  loss are the first line of treatment for diabetes. Marisa [name] will continue with diet and exercise, and will follow up.  2. Other hyperlipidemia Cardiovascular risk and specific lipid/LDL goals reviewed. We discussed several lifestyle modifications today and Marisa [name] will continue to work on diet, exercise and weight loss efforts. We will recheck labs in 3 months. Orders and follow up as documented in patient record.   Counseling Intensive lifestyle modifications are the first line treatment for this issue. . Dietary changes: Increase soluble fiber. Decrease simple carbohydrates. . Exercise changes: Moderate to vigorous-intensity aerobic activity 150 minutes per week if tolerated. . Lipid-lowering medications: see documented in medical record.  3. Vitamin D deficiency Low Vitamin D level contributes to fatigue and are associated with obesity, breast, and colon cancer. Marisa [name] agreed to start prescription Vitamin D 50,000 IU every week. She will follow-up for routine testing of Vitamin D, at least 2-3 times per year to avoid over-replacement. We will recheck labs in 3 months.  - Vitamin D, Ergocalciferol, (DRISDOL) 1.25 MG (50000 UNIT) CAPS capsule; Take 1 capsule (50,000 Units total) by mouth every 7 (seven) days.  Dispense: 4 capsule; Refill: 0  4. At risk for hypoglycemia Marisa [name] was given approximately 30 minutes of counseling today regarding prevention of hypoglycemia. She was advised of symptoms of hypoglycemia. Marisa [name] was instructed to eat regular meals.   5. Class 2 severe obesity with serious comorbidity and body mass index (BMI) of 35.0 to 35.9 in adult, unspecified obesity type (HCC) Marisa [name] is currently in the action stage of change. As such, her goal is to continue with weight loss efforts. She has agreed to change to  keeping a food journal and adhering to recommended goals of 1300-1500 calories and 85 grams of protein daily.   Exercise goals: No exercise has been prescribed at  this time.  Behavioral modification strategies: increasing lean protein intake.  Marisa [name] has agreed to follow-up with our clinic in 2 weeks. She was informed of the importance of frequent follow-up visits to maximize her success with intensive lifestyle modifications for her multiple health conditions.   Objective:   Blood pressure 115/80, pulse 82, temperature 98.1 F (36.7 C), temperature source Oral, height 5\' 5"  (1.651 m), weight 215 lb (97.5 kg), last menstrual period 11/27/2012, SpO2 97 %. Body mass index is 35.78 kg/m.  General: Cooperative, alert, well developed, in no acute distress. HEENT: Conjunctivae and lids unremarkable. Cardiovascular: Regular rhythm.  Lungs: Normal work of breathing. Neurologic: No focal deficits.   Lab Results  Component Value Date   CREATININE 0.48 (L) 03/03/2019   BUN 10 03/03/2019   NA 140 03/03/2019   K 4.3 03/03/2019   CL 101 03/03/2019   CO2 22 03/03/2019   Lab Results  Component Value Date   ALT 22 03/03/2019   AST 20 03/03/2019   ALKPHOS 100 03/03/2019   BILITOT 0.5 03/03/2019   Lab Results  Component Value Date   HGBA1C 5.6 03/03/2019   HGBA1C 5.5 10/20/2018   HGBA1C 5.4 12/29/2017   HGBA1C 5.9 (H) 04/08/2017   HGBA1C 6.2 12/17/2016   Lab Results  Component Value Date   INSULIN 12.7 03/03/2019   Lab Results  Component Value Date   TSH 0.740 03/03/2019   Lab Results  Component Value Date   CHOL 186 03/03/2019   HDL 41 03/03/2019   LDLCALC 98 03/03/2019   TRIG 276 (H) 03/03/2019   CHOLHDL 4.6 (H) 10/20/2018   Lab Results  Component Value Date   WBC 5.9 03/03/2019   HGB 14.2 03/03/2019   HCT 43.2 03/03/2019   MCV 96 03/03/2019   PLT 433 03/03/2019   No results found for: IRON, TIBC, FERRITIN  Attestation Statements:   Reviewed by clinician on day of visit: allergies, medications, problem list, medical history, surgical history, family history, social history, and previous encounter notes.   I, Trixie Dredge, am acting as transcriptionist for Dennard Nip, MD.  I have reviewed the above documentation for accuracy and completeness, and I agree with the above. -  Dennard Nip, MD

## 2019-03-22 DIAGNOSIS — J4520 Mild intermittent asthma, uncomplicated: Secondary | ICD-10-CM | POA: Diagnosis not present

## 2019-04-04 ENCOUNTER — Ambulatory Visit (INDEPENDENT_AMBULATORY_CARE_PROVIDER_SITE_OTHER): Payer: Medicare Other | Admitting: Family Medicine

## 2019-04-04 ENCOUNTER — Encounter (INDEPENDENT_AMBULATORY_CARE_PROVIDER_SITE_OTHER): Payer: Self-pay | Admitting: Family Medicine

## 2019-04-04 ENCOUNTER — Other Ambulatory Visit: Payer: Self-pay

## 2019-04-04 VITALS — BP 120/82 | HR 77 | Temp 97.6°F | Ht 65.0 in | Wt 216.0 lb

## 2019-04-04 DIAGNOSIS — E559 Vitamin D deficiency, unspecified: Secondary | ICD-10-CM

## 2019-04-04 DIAGNOSIS — E119 Type 2 diabetes mellitus without complications: Secondary | ICD-10-CM

## 2019-04-04 DIAGNOSIS — Z9189 Other specified personal risk factors, not elsewhere classified: Secondary | ICD-10-CM | POA: Diagnosis not present

## 2019-04-04 DIAGNOSIS — Z6836 Body mass index (BMI) 36.0-36.9, adult: Secondary | ICD-10-CM

## 2019-04-04 MED ORDER — RYBELSUS 3 MG PO TABS: 1.0000 | ORAL_TABLET | Freq: Every day | ORAL | 0 refills | Status: DC

## 2019-04-04 MED ORDER — VITAMIN D (ERGOCALCIFEROL) 1.25 MG (50000 UNIT) PO CAPS: 50000.0000 [IU] | ORAL_CAPSULE | ORAL | 0 refills | Status: DC

## 2019-04-04 NOTE — Progress Notes (Signed)
Chief Complaint:   OBESITY Marisa [name] is here to discuss her progress with her obesity treatment plan along with follow-up of her obesity related diagnoses. Marisa [name] is on the keeping a food journal of 1400 to 1500 calories and 85 grams of protein daily plan and states she is following her eating plan approximately 90% of the time. Marisa [name] states she is exercising 0 minutes 0 times per week.  Today's visit was #: 3 Starting weight: 212 lbs Starting date: 03/03/2019 Today's weight: 216 lbs Today's date: 04/04/2019 Total lbs lost to date: 0 Total lbs lost since last in-office visit: 0  Interim History: Marisa [name] has journaled consistently and she is meeting her protein goals. She is keeping calories around 1,300 per day. She is frustrated due to the lack of weight loss this visit.  Subjective:   Vitamin D deficiency Marisa [name]'s Vitamin D level was 13.2 on 03/03/19 and was not at goal. She is currently taking prescription vit D.   Type 2 diabetes mellitus without complication, without long-term current use of insulin ([place]) DM is well controlled.Marisa [name] admits to polyphagia. Her fasting BGs range between 90 and 102 and her 2 hour post prandial blood sugars range between 90 and 100.  She is on metformin currently. She has hyperglycemia rarely, about once every 3 to 4 weeks.  Lab Results  Component Value Date   HGBA1C 5.6 03/03/2019   HGBA1C 5.5 10/20/2018   HGBA1C 5.4 12/29/2017   Lab Results  Component Value Date   MICROALBUR CANCELED 04/16/2016   LDLCALC 98 03/03/2019   CREATININE 0.48 (L) 03/03/2019   Lab Results  Component Value Date   INSULIN 12.7 03/03/2019   At risk for constipation Marisa [name] is at increased risk for constipation due to inadequate water intake, changes in diet, and/or use of medications such as GLP1 agonists. Marisa [name] denies hard, infrequent stools currently.   Assessment/Plan:   Vitamin D deficiency  Low Vitamin D level contributes to  fatigue and are associated with obesity, breast, and colon cancer. Marisa [name] agrees to continue to take prescription Vitamin D @50 ,000 IU every week #4 with no refills and she will follow-up for routine testing of Vitamin D, at least 2-3 times per year to avoid over-replacement.  Type 2 diabetes mellitus without complication, without long-term current use of insulin (HCC) Good blood sugar control is important to decrease the likelihood of diabetic complications such as nephropathy, neuropathy, limb loss, blindness, coronary artery disease, and death. Intensive lifestyle modification including diet, exercise and weight loss are the first line of treatment for diabetes. Marisa [name] agrees to start Semaglutide (RYBELSUS) 3 MG TABS daily in the morning before breakfast #30 with no refills.  At risk for constipation Marisa [name] was given approximately 15 minutes of counseling today regarding prevention of constipation. She was encouraged to increase water and fiber intake.   Class 2 severe obesity with serious comorbidity and body mass index (BMI) of 36.0 to 36.9 in adult, unspecified obesity type (HCC) Marisa [name] is currently in the action stage of change. As such, her goal is to continue with weight loss efforts. She has agreed to keeping a food journal and adhering to recommended goals of 1300 to 1500 calories and 85 grams of protein daily.   Exercise goals: No exercise has been prescribed at this time.  Behavioral modification strategies: increasing lean protein intake, decreasing simple carbohydrates and keeping a strict food journal.  Handouts: Dining out, Category 2, protein content of food.  Marisa [name] has agreed to follow-up  with our clinic in 2 weeks. She was informed of the importance of frequent follow-up visits to maximize her success with intensive lifestyle modifications for her multiple health conditions.   Objective:   Blood pressure 120/82, pulse 77, temperature 97.6 F (36.4 C), temperature  source Oral, height 5\' 5"  (1.651 m), weight 216 lb (98 kg), last menstrual period 11/27/2012, SpO2 98 %. Body mass index is 35.94 kg/m.  General: Cooperative, alert, well developed, in no acute distress. HEENT: Conjunctivae and lids unremarkable. Cardiovascular: Regular rhythm.  Lungs: Normal work of breathing. Neurologic: No focal deficits.   Lab Results  Component Value Date   CREATININE 0.48 (L) 03/03/2019   BUN 10 03/03/2019   NA 140 03/03/2019   K 4.3 03/03/2019   CL 101 03/03/2019   CO2 22 03/03/2019   Lab Results  Component Value Date   ALT 22 03/03/2019   AST 20 03/03/2019   ALKPHOS 100 03/03/2019   BILITOT 0.5 03/03/2019   Lab Results  Component Value Date   HGBA1C 5.6 03/03/2019   HGBA1C 5.5 10/20/2018   HGBA1C 5.4 12/29/2017   HGBA1C 5.9 (H) 04/08/2017   HGBA1C 6.2 12/17/2016   Lab Results  Component Value Date   INSULIN 12.7 03/03/2019   Lab Results  Component Value Date   TSH 0.740 03/03/2019   Lab Results  Component Value Date   CHOL 186 03/03/2019   HDL 41 03/03/2019   LDLCALC 98 03/03/2019   TRIG 276 (H) 03/03/2019   CHOLHDL 4.6 (H) 10/20/2018   Lab Results  Component Value Date   WBC 5.9 03/03/2019   HGB 14.2 03/03/2019   HCT 43.2 03/03/2019   MCV 96 03/03/2019   PLT 433 03/03/2019   No results found for: IRON, TIBC, FERRITIN   Ref. Range 03/03/2019 12:42  Vitamin D, 25-Hydroxy Latest Ref Range: 30.0 - 100.0 ng/mL 13.2 (L)    Attestation Statements:   Reviewed by clinician on day of visit: allergies, medications, problem list, medical history, surgical history, family history, social history, and previous encounter notes.  Corey Skains, am acting as Location manager for [employer], FNP-C.  I have reviewed the above documentation for accuracy and completeness, and I agree with the above. -  Arbie Blankley [employer], FNP-C

## 2019-04-05 DIAGNOSIS — Z6836 Body mass index (BMI) 36.0-36.9, adult: Secondary | ICD-10-CM | POA: Insufficient documentation

## 2019-04-05 DIAGNOSIS — E559 Vitamin D deficiency, unspecified: Secondary | ICD-10-CM | POA: Insufficient documentation

## 2019-04-18 ENCOUNTER — Ambulatory Visit (INDEPENDENT_AMBULATORY_CARE_PROVIDER_SITE_OTHER): Payer: Medicare Other | Admitting: Family Medicine

## 2019-04-18 ENCOUNTER — Other Ambulatory Visit: Payer: Self-pay

## 2019-04-18 ENCOUNTER — Encounter (INDEPENDENT_AMBULATORY_CARE_PROVIDER_SITE_OTHER): Payer: Self-pay | Admitting: Family Medicine

## 2019-04-18 VITALS — BP 134/83 | HR 78 | Temp 98.4°F | Ht 65.0 in | Wt 214.0 lb

## 2019-04-18 DIAGNOSIS — Z6835 Body mass index (BMI) 35.0-35.9, adult: Secondary | ICD-10-CM | POA: Diagnosis not present

## 2019-04-18 DIAGNOSIS — R609 Edema, unspecified: Secondary | ICD-10-CM | POA: Diagnosis not present

## 2019-04-18 DIAGNOSIS — E119 Type 2 diabetes mellitus without complications: Secondary | ICD-10-CM

## 2019-04-18 NOTE — Progress Notes (Signed)
Chief Complaint:   OBESITY Marisa [name] is here to discuss her progress with her obesity treatment plan along with follow-up of her obesity related diagnoses. Marisa [name] is on keeping a food journal and adhering to recommended goals of 1300-1500 calories and 85 grams of protein daily and states she is following her eating plan approximately 90% of the time. Marisa [name] states she is doing 0 minutes 0 times per week.  Today's visit was #: 4 Starting weight: 212 lbs Starting date: 03/03/2019 Today's weight: 214 lbs Today's date: 04/18/2019 Total lbs lost to date: 0 Total lbs lost since last in-office visit: 2  Interim History: Marisa [name] reports polyphagia after dinner. She is journaling most days. She is hitting her protein goal consistently. Most days her calorie intake is around 1300.  Subjective:   1. Type 2 diabetes mellitus without complication, without long-term current use of insulin (HCC) Marisa [name]'s diabetes is well controlled. She started Rybelsus at her last visit, and she notes decreased polyphagia. Her fasting BGs range between 98 and 101, and before supper between 98 and 101.  2. Peripheral edema Marisa [name] notes swelling in her ankles at the end of the day. She denies chest pain, but she notes orthopnea. She had a sleep study in 2016, but she was not told that she needed CPAP. She notes she has gained weight since her sleep study.  Assessment/Plan:   1. Type 2 diabetes mellitus without complication, without long-term current use of insulin (HCC) Good blood sugar control is important to decrease the likelihood of diabetic complications such as nephropathy, neuropathy, limb loss, blindness, coronary artery disease, and death. Intensive lifestyle modification including diet, exercise and weight loss are the first line of treatment for diabetes. Marisa [name] agreed to continue Rybelsus 3 mg and metformin, and we will continue to monitor.  2. Peripheral edema Marisa [name] is to contact her  primary care physician concerning orthopnea and ankle edema.  3. Class 2 severe obesity with serious comorbidity and body mass index (BMI) of 35.0 to 35.9 in adult, unspecified obesity type (HCC) Marisa [name] is currently in the action stage of change. As such, her goal is to continue with weight loss efforts. She has agreed to keeping a food journal and adhering to recommended goals of 1300-1500 calories and 85 grams of protein daily.   Marisa [name] is to have a night time protein snack. Handout given today: 100 calorie snacks.  Exercise goals: No exercise has been prescribed at this time.  Behavioral modification strategies: decreasing simple carbohydrates, meal planning and cooking strategies, better snacking choices and planning for success.  Marisa [name] has agreed to follow-up with our clinic in 2 weeks. She was informed of the importance of frequent follow-up visits to maximize her success with intensive lifestyle modifications for her multiple health conditions.   Objective:   Blood pressure 134/83, pulse 78, temperature 98.4 F (36.9 C), temperature source Oral, height 5\' 5"  (1.651 m), weight 214 lb (97.1 kg), last menstrual period 11/27/2012, SpO2 98 %. Body mass index is 35.61 kg/m.  General: Cooperative, alert, well developed, in no acute distress. HEENT: Conjunctivae and lids unremarkable. Cardiovascular: Regular rhythm.  Lungs: Normal work of breathing. Neurologic: No focal deficits.   Lab Results  Component Value Date   CREATININE 0.48 (L) 03/03/2019   BUN 10 03/03/2019   NA 140 03/03/2019   K 4.3 03/03/2019   CL 101 03/03/2019   CO2 22 03/03/2019   Lab Results  Component Value Date   ALT 22 03/03/2019   AST  20 03/03/2019   ALKPHOS 100 03/03/2019   BILITOT 0.5 03/03/2019   Lab Results  Component Value Date   HGBA1C 5.6 03/03/2019   HGBA1C 5.5 10/20/2018   HGBA1C 5.4 12/29/2017   HGBA1C 5.9 (H) 04/08/2017   HGBA1C 6.2 12/17/2016   Lab Results  Component Value Date    INSULIN 12.7 03/03/2019   Lab Results  Component Value Date   TSH 0.740 03/03/2019   Lab Results  Component Value Date   CHOL 186 03/03/2019   HDL 41 03/03/2019   LDLCALC 98 03/03/2019   TRIG 276 (H) 03/03/2019   CHOLHDL 4.6 (H) 10/20/2018   Lab Results  Component Value Date   WBC 5.9 03/03/2019   HGB 14.2 03/03/2019   HCT 43.2 03/03/2019   MCV 96 03/03/2019   PLT 433 03/03/2019   No results found for: IRON, TIBC, FERRITIN  Obesity Behavioral Intervention Documentation for Insurance:   Approximately 15 minutes were spent on the discussion below.  ASK: We discussed the diagnosis of obesity with Marisa [name] today and Marisa [name] agreed to give [name] permission to discuss obesity behavioral modification therapy today.  ASSESS: Marisa [name] has the diagnosis of obesity and her BMI today is 35.61. Marisa [name] is in the action stage of change.   ADVISE: Marisa [name] was educated on the multiple health risks of obesity as well as the benefit of weight loss to improve her health. She was advised of the need for long term treatment and the importance of lifestyle modifications to improve her current health and to decrease her risk of future health problems.  AGREE: Multiple dietary modification options and treatment options were discussed and Marisa [name] agreed to follow the recommendations documented in the above note.  ARRANGE: Marisa [name] was educated on the importance of frequent visits to treat obesity as outlined per CMS and USPSTF guidelines and agreed to schedule her next follow up appointment today.  Attestation Statements:   Reviewed by clinician on day of visit: allergies, medications, problem list, medical history, surgical history, family history, social history, and previous encounter notes.   Wilhemena Durie, am acting as Location manager for [employer], FNP-C.  I have reviewed the above documentation for accuracy and completeness, and I agree with the above. -  Georgianne Fick,  FNP

## 2019-04-19 DIAGNOSIS — J4520 Mild intermittent asthma, uncomplicated: Secondary | ICD-10-CM | POA: Diagnosis not present

## 2019-04-20 DIAGNOSIS — R600 Localized edema: Secondary | ICD-10-CM | POA: Insufficient documentation

## 2019-04-20 DIAGNOSIS — R609 Edema, unspecified: Secondary | ICD-10-CM | POA: Insufficient documentation

## 2019-05-02 ENCOUNTER — Encounter (INDEPENDENT_AMBULATORY_CARE_PROVIDER_SITE_OTHER): Payer: Self-pay | Admitting: Family Medicine

## 2019-05-02 ENCOUNTER — Ambulatory Visit (INDEPENDENT_AMBULATORY_CARE_PROVIDER_SITE_OTHER): Payer: Medicare Other | Admitting: Family Medicine

## 2019-05-02 ENCOUNTER — Other Ambulatory Visit: Payer: Self-pay

## 2019-05-02 VITALS — BP 117/75 | HR 73 | Temp 97.8°F | Ht 65.0 in | Wt 215.0 lb

## 2019-05-02 DIAGNOSIS — E559 Vitamin D deficiency, unspecified: Secondary | ICD-10-CM | POA: Diagnosis not present

## 2019-05-02 DIAGNOSIS — F3289 Other specified depressive episodes: Secondary | ICD-10-CM | POA: Diagnosis not present

## 2019-05-02 DIAGNOSIS — R609 Edema, unspecified: Secondary | ICD-10-CM | POA: Diagnosis not present

## 2019-05-02 DIAGNOSIS — E119 Type 2 diabetes mellitus without complications: Secondary | ICD-10-CM | POA: Diagnosis not present

## 2019-05-02 DIAGNOSIS — Z6835 Body mass index (BMI) 35.0-35.9, adult: Secondary | ICD-10-CM | POA: Diagnosis not present

## 2019-05-02 MED ORDER — RYBELSUS 7 MG PO TABS: 7.0000 mg | ORAL_TABLET | Freq: Every day | ORAL | 0 refills | Status: DC

## 2019-05-02 MED ORDER — VITAMIN D (ERGOCALCIFEROL) 1.25 MG (50000 UNIT) PO CAPS: 50000.0000 [IU] | ORAL_CAPSULE | ORAL | 0 refills | Status: DC

## 2019-05-03 ENCOUNTER — Encounter (INDEPENDENT_AMBULATORY_CARE_PROVIDER_SITE_OTHER): Payer: Self-pay | Admitting: Family Medicine

## 2019-05-03 NOTE — Progress Notes (Signed)
Chief Complaint:   OBESITY Marisa [name] is here to discuss her progress with her obesity treatment plan along with follow-up of her obesity related diagnoses. Marisa [name] is on keeping a food journal and adhering to recommended goals of 1300-1500 calories and 85 grams of protein and states she is following her eating plan approximately 90% of the time. Marisa [name] states she is doing 0 minutes 0 times per week.  Today's visit was #: 5 Starting weight: 212 lbs Starting date: 03/03/2019 Today's weight: 215 lbs Today's date: 05/02/2019 Total lbs lost to date: 0 Total lbs lost since last in-office visit: 0  Interim History: Marisa [name] is hungry after dinner. She reports sticking to 85 grams of protein and 1300-1500 calories daily. Her water intake is good. She does drink some zero calorie beverages. She is up 2 lbs of fluid today per bioimpedance scale..  Subjective:   1. Vitamin D deficiency Marisa [name]'s last Vit D level was low at 13.2. She is on prescription Vit D.  2. Peripheral edema Marisa [name] has had long standing peripheral edema. She has tried unsuccessfully to contact her primary care physician regarding this. She denies shortness of breath.  3. Type 2 diabetes mellitus without complication, without long-term current use of insulin ([place]) Marisa [name]'s diabetes is well controlled. She is on Rybelsus 3 mg daily which she is tolerating well. She notes polyphagia. Last A1c was 5.6.  4. Other depression, with emotional eating Marisa [name] reports cravings at night. She tends to snack when she has "down time" at night.  Assessment/Plan:   1. Vitamin D deficiency Low Vitamin D level contributes to fatigue and are associated with obesity, breast, and colon cancer. We will refill prescription Vitamin D for 1 month. Marisa [name] will follow-up for routine testing of Vitamin D, at least 2-3 times per year to avoid over-replacement.  - Vitamin D, Ergocalciferol, (DRISDOL) 1.25 MG (50000 UNIT) CAPS capsule;  Take 1 capsule (50,000 Units total) by mouth every 7 (seven) days.  Dispense: 4 capsule; Refill: 0  2. Peripheral edema I suggested that Marisa [name] send a MyChart message to her primary care physician requesting an appointment.  3. Type 2 diabetes mellitus without complication, without long-term current use of insulin (HCC) Good blood sugar control is important to decrease the likelihood of diabetic complications such as nephropathy, neuropathy, limb loss, blindness, coronary artery disease, and death. Intensive lifestyle modification including diet, exercise and weight loss are the first line of treatment for diabetes. Marisa [name] agreed to increase Rybelsus to 7 mg daily with no refills.  - Semaglutide (RYBELSUS) 7 MG TABS; Take 7 mg by mouth daily before breakfast.  Dispense: 30 tablet; Refill: 0  4. Other depression, with emotional eating Behavior modification techniques were discussed today to help Marisa [name] deal with her emotional/non-hunger eating behaviors. We will refer to Marisa [name], our Bariatric Psychologist for evaluation. Orders and follow up as documented in patient record.   5. Class 2 severe obesity with serious comorbidity and body mass index (BMI) of 35.0 to 35.9 in adult, unspecified obesity type (HCC) Marisa [name] is currently in the action stage of change. As such, her goal is to continue with weight loss efforts. She has agreed to keeping a food journal and adhering to recommended goals of 1300-1500 calories and 85 grams of protein daily.   Exercise goals: No exercise has been prescribed at this time.  Behavioral modification strategies: increasing lean protein intake, decreasing simple carbohydrates and keeping a strict food journal.  Marisa [name] has agreed to follow-up with our  clinic in 2 weeks. She was informed of the importance of frequent follow-up visits to maximize her success with intensive lifestyle modifications for her multiple health conditions.   Objective:   Blood  pressure 117/75, pulse 73, temperature 97.8 F (36.6 C), temperature source Oral, height 5\' 5"  (1.651 m), weight 215 lb (97.5 kg), last menstrual period 11/27/2012, SpO2 97 %. Body mass index is 35.78 kg/m.  General: Cooperative, alert, well developed, in no acute distress. HEENT: Conjunctivae and lids unremarkable. Cardiovascular: Regular rhythm.  Lungs: Normal work of breathing. Neurologic: No focal deficits.   Lab Results  Component Value Date   CREATININE 0.48 (L) 03/03/2019   BUN 10 03/03/2019   NA 140 03/03/2019   K 4.3 03/03/2019   CL 101 03/03/2019   CO2 22 03/03/2019   Lab Results  Component Value Date   ALT 22 03/03/2019   AST 20 03/03/2019   ALKPHOS 100 03/03/2019   BILITOT 0.5 03/03/2019   Lab Results  Component Value Date   HGBA1C 5.6 03/03/2019   HGBA1C 5.5 10/20/2018   HGBA1C 5.4 12/29/2017   HGBA1C 5.9 (H) 04/08/2017   HGBA1C 6.2 12/17/2016   Lab Results  Component Value Date   INSULIN 12.7 03/03/2019   Lab Results  Component Value Date   TSH 0.740 03/03/2019   Lab Results  Component Value Date   CHOL 186 03/03/2019   HDL 41 03/03/2019   LDLCALC 98 03/03/2019   TRIG 276 (H) 03/03/2019   CHOLHDL 4.6 (H) 10/20/2018   Lab Results  Component Value Date   WBC 5.9 03/03/2019   HGB 14.2 03/03/2019   HCT 43.2 03/03/2019   MCV 96 03/03/2019   PLT 433 03/03/2019   No results found for: IRON, TIBC, FERRITIN  Obesity Behavioral Intervention Documentation for Insurance:   Approximately 15 minutes were spent on the discussion below.  ASK: We discussed the diagnosis of obesity with Marisa [name] today and Marisa [name] agreed to give [name] permission to discuss obesity behavioral modification therapy today.  ASSESS: Marisa [name] has the diagnosis of obesity and her BMI today is 35.78. Marisa [name] is in the action stage of change.   ADVISE: Marisa [name] was educated on the multiple health risks of obesity as well as the benefit of weight loss to improve her health. She  was advised of the need for long term treatment and the importance of lifestyle modifications to improve her current health and to decrease her risk of future health problems.  AGREE: Multiple dietary modification options and treatment options were discussed and Marisa [name] agreed to follow the recommendations documented in the above note.  ARRANGE: Marisa [name] was educated on the importance of frequent visits to treat obesity as outlined per CMS and USPSTF guidelines and agreed to schedule her next follow up appointment today.  Attestation Statements:   Reviewed by clinician on day of visit: allergies, medications, problem list, medical history, surgical history, family history, social history, and previous encounter notes.   Wilhemena Durie, am acting as Location manager for [employer], FNP-C.  I have reviewed the above documentation for accuracy and completeness, and I agree with the above. -  Georgianne Fick, FNP
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
## 2019-05-18 ENCOUNTER — Ambulatory Visit (INDEPENDENT_AMBULATORY_CARE_PROVIDER_SITE_OTHER): Payer: Medicare Other | Admitting: Family Medicine

## 2019-05-19 ENCOUNTER — Encounter (INDEPENDENT_AMBULATORY_CARE_PROVIDER_SITE_OTHER): Payer: Self-pay | Admitting: Family Medicine

## 2019-05-19 ENCOUNTER — Ambulatory Visit (INDEPENDENT_AMBULATORY_CARE_PROVIDER_SITE_OTHER): Payer: Medicare Other | Admitting: Family Medicine

## 2019-05-19 ENCOUNTER — Other Ambulatory Visit: Payer: Self-pay

## 2019-05-19 VITALS — BP 119/80 | HR 75 | Temp 97.7°F | Ht 65.0 in | Wt 216.0 lb

## 2019-05-19 DIAGNOSIS — Z6836 Body mass index (BMI) 36.0-36.9, adult: Secondary | ICD-10-CM | POA: Diagnosis not present

## 2019-05-19 DIAGNOSIS — E88819 Insulin resistance, unspecified: Secondary | ICD-10-CM | POA: Insufficient documentation

## 2019-05-19 DIAGNOSIS — E8881 Metabolic syndrome: Secondary | ICD-10-CM | POA: Insufficient documentation

## 2019-05-19 DIAGNOSIS — E119 Type 2 diabetes mellitus without complications: Secondary | ICD-10-CM | POA: Diagnosis not present

## 2019-05-19 NOTE — Progress Notes (Signed)
Chief Complaint:   OBESITY Marisa [name] is here to discuss her progress with her obesity treatment plan along with follow-up of her obesity related diagnoses. Marisa [name] is on keeping a food journal and adhering to recommended goals of 1300-1500 calories and 85 grams of protein daily and states she is following her eating plan approximately 90% of the time. Marisa [name] states she is doing 0 minutes 0 times per week.  Today's visit was #: 6 Starting weight: 216 lbs Starting date: 03/03/2019 Today's weight: 216 lbs Today's date: 05/19/2019 Total lbs lost to date: 0 Total lbs lost since last in-office visit: 0  Interim History: Marisa [name] has been on steroids recently for asthma flair. She is journaling every other day, and she meets her calories and protein goals. She is making healthy choices on days she does not journal. 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  Subjective:   1. Type 2 diabetes Marisa [name]'s appetite is well controlled on Rybelsus, which was changed to 7 mg at her last visit. She denies nausea or constipation. Lab Results  Component Value Date   HGBA1C 5.6 03/03/2019    Assessment/Plan:   1.Type 2 diabetes Good blood sugar control is important to decrease the likelihood of diabetic complications such as nephropathy, neuropathy, limb loss, blindness, coronary artery disease, and death. Intensive lifestyle modification including diet, exercise and weight loss are the first line of treatment for diabetes. Marisa [name] agreed continue Rybelsus and metformin, and she agreed to follow-up with [name] as directed to closely monitor her progress.  2. Class 2 severe obesity with serious comorbidity and body mass index (BMI) of 36.0 to 36.9 in adult, unspecified obesity type (HCC) Marisa [name] is currently in the action stage of change. As such, her goal is to continue with weight loss efforts. She has agreed to keeping a food journal and adhering to recommended goals of  1300-1500 calories and 85 grams of protein daily.   Marisa [name] is to journal 5 out of 7 days per week.  Exercise goals: No exercise has been prescribed at this time.  Behavioral modification strategies: increasing lean protein intake, decreasing simple carbohydrates and planning for success.  Marisa [name] has agreed to follow-up with our clinic in 2 to 3 weeks. She was informed of the importance of frequent follow-up visits to maximize her success with intensive lifestyle modifications for her multiple health conditions.   Objective:   Blood pressure 119/80, pulse 75, temperature 97.7 F (36.5 C), temperature source Oral, height 5\' 5"  (1.651 m), weight 216 lb (98 kg), last menstrual period 11/27/2012, SpO2 97 %. Body mass index is 35.94 kg/m.  General: Cooperative, alert, well developed, in no acute distress. HEENT: Conjunctivae and lids unremarkable. Cardiovascular: Regular rhythm.  Lungs: Normal work of breathing. Neurologic: No focal deficits.   Lab Results  Component Value Date   CREATININE 0.48 (L) 03/03/2019   BUN 10 03/03/2019   NA 140 03/03/2019   K 4.3 03/03/2019   CL 101 03/03/2019   CO2 22 03/03/2019   Lab Results  Component Value Date   ALT 22 03/03/2019   AST 20 03/03/2019   ALKPHOS 100 03/03/2019   BILITOT 0.5 03/03/2019   Lab Results  Component Value Date   HGBA1C 5.6 03/03/2019   HGBA1C 5.5 10/20/2018   HGBA1C 5.4 12/29/2017   HGBA1C 5.9 (H) 04/08/2017   HGBA1C 6.2 12/17/2016   Lab Results  Component Value Date   INSULIN 12.7 03/03/2019   Lab Results  Component Value  Date   TSH 0.740 03/03/2019   Lab Results  Component Value Date   CHOL 186 03/03/2019   HDL 41 03/03/2019   LDLCALC 98 03/03/2019   TRIG 276 (H) 03/03/2019   CHOLHDL 4.6 (H) 10/20/2018   Lab Results  Component Value Date   WBC 5.9 03/03/2019   HGB 14.2 03/03/2019   HCT 43.2 03/03/2019   MCV 96 03/03/2019   PLT 433 03/03/2019   No results found for: IRON, TIBC, FERRITIN   Obesity Behavioral Intervention Documentation for Insurance:   Approximately 15 minutes were spent on the discussion below.  ASK: We discussed the diagnosis of obesity with Marisa [name] today and Marisa [name] agreed to give [name] permission to discuss obesity behavioral modification therapy today.  ASSESS: Marisa [name] has the diagnosis of obesity and her BMI today is 35.94. Marisa [name] is in the action stage of change.   ADVISE: Marisa [name] was educated on the multiple health risks of obesity as well as the benefit of weight loss to improve her health. She was advised of the need for long term treatment and the importance of lifestyle modifications to improve her current health and to decrease her risk of future health problems.  AGREE: Multiple dietary modification options and treatment options were discussed and Marisa [name] agreed to follow the recommendations documented in the above note.  ARRANGE: Marisa [name] was educated on the importance of frequent visits to treat obesity as outlined per CMS and USPSTF guidelines and agreed to schedule her next follow up appointment today.  Attestation Statements:   Reviewed by clinician on day of visit: allergies, medications, problem list, medical history, surgical history, family history, social history, and previous encounter notes.   Wilhemena Durie, am acting as Location manager for [employer], FNP-C.  I have reviewed the above documentation for accuracy and completeness, and I agree with the above. -  Georgianne Fick, FNP

## 2019-05-20 DIAGNOSIS — J4520 Mild intermittent asthma, uncomplicated: Secondary | ICD-10-CM | POA: Diagnosis not present

## 2019-05-23 ENCOUNTER — Telehealth (INDEPENDENT_AMBULATORY_CARE_PROVIDER_SITE_OTHER): Payer: Self-pay | Admitting: Psychology

## 2019-05-23 ENCOUNTER — Ambulatory Visit (INDEPENDENT_AMBULATORY_CARE_PROVIDER_SITE_OTHER): Payer: Medicare Other | Admitting: Psychology

## 2019-05-23 ENCOUNTER — Encounter (INDEPENDENT_AMBULATORY_CARE_PROVIDER_SITE_OTHER): Payer: Self-pay
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
## 2019-05-23 NOTE — Telephone Encounter (Addendum)
  Office: [phone]  /  Fax: [phone]  Date of Call: May 23, 2019  Time of Call: 3:02pm Duration of Call: ~4 minutes Provider: Glennie Isle, PsyD  CONTENT: This provider called Marisa [name] to check-in as she did not present for today's Webex appointment at 3:00pm. Marisa [name] reported she was on the road on her way to [place] and others were with her in the car. It was recommended today's appointment be rescheduled. As such, Marisa [name] noted a plan to place the phone on speaker for her granddaughter to write down the appointment information. This provider encouraged Marisa [name] to pull over in a safe location prior to proceeding, and limits of confidentiality were briefly discussed. She was receptive to pulling over in a safe location to re-schedule the appointment versus placing the phone on speaker for her granddaughter. Marisa [name] was informed there would be a $50 no show fee charge for today's new patient appointment and she acknowledged understanding. A brief risk assessment was completed. Marisa [name] denied experiencing suicidal and homicidal ideation, plan, or intent.  PLAN: Marisa [name] is scheduled for an appointment on June 06, 2019 at 11:00am via [place] [facility].

## 2019-05-29 ENCOUNTER — Other Ambulatory Visit (INDEPENDENT_AMBULATORY_CARE_PROVIDER_SITE_OTHER): Payer: Self-pay | Admitting: Family Medicine

## 2019-05-29 DIAGNOSIS — E119 Type 2 diabetes mellitus without complications: Secondary | ICD-10-CM

## 2019-05-29 DIAGNOSIS — E559 Vitamin D deficiency, unspecified: Secondary | ICD-10-CM

## 2019-06-06 ENCOUNTER — Encounter (INDEPENDENT_AMBULATORY_CARE_PROVIDER_SITE_OTHER): Payer: Self-pay

## 2019-06-06 ENCOUNTER — Telehealth (INDEPENDENT_AMBULATORY_CARE_PROVIDER_SITE_OTHER): Payer: Medicare Other | Admitting: Psychology

## 2019-06-07 ENCOUNTER — Ambulatory Visit (INDEPENDENT_AMBULATORY_CARE_PROVIDER_SITE_OTHER): Payer: Medicare Other | Admitting: Family Medicine

## 2019-06-07 ENCOUNTER — Encounter (INDEPENDENT_AMBULATORY_CARE_PROVIDER_SITE_OTHER): Payer: Self-pay | Admitting: Family Medicine

## 2019-06-07 ENCOUNTER — Other Ambulatory Visit: Payer: Self-pay

## 2019-06-07 VITALS — BP 122/80 | HR 87 | Temp 98.4°F | Ht 65.0 in | Wt 219.0 lb

## 2019-06-07 DIAGNOSIS — E559 Vitamin D deficiency, unspecified: Secondary | ICD-10-CM | POA: Diagnosis not present

## 2019-06-07 DIAGNOSIS — Z6836 Body mass index (BMI) 36.0-36.9, adult: Secondary | ICD-10-CM

## 2019-06-07 DIAGNOSIS — E1169 Type 2 diabetes mellitus with other specified complication: Secondary | ICD-10-CM | POA: Diagnosis not present

## 2019-06-07 DIAGNOSIS — E119 Type 2 diabetes mellitus without complications: Secondary | ICD-10-CM

## 2019-06-07 MED ORDER — VITAMIN D (ERGOCALCIFEROL) 1.25 MG (50000 UNIT) PO CAPS: 50000.0000 [IU] | ORAL_CAPSULE | ORAL | 0 refills | Status: DC

## 2019-06-07 MED ORDER — RYBELSUS 7 MG PO TABS: 7.0000 mg | ORAL_TABLET | Freq: Every day | ORAL | 0 refills | Status: DC

## 2019-06-08 ENCOUNTER — Encounter (INDEPENDENT_AMBULATORY_CARE_PROVIDER_SITE_OTHER): Payer: Self-pay | Admitting: Family Medicine

## 2019-06-08 NOTE — Progress Notes (Signed)
Chief Complaint:   OBESITY Marisa [name] is here to discuss her progress with her obesity treatment plan along with follow-up of her obesity related diagnoses. Marisa [name] is on keeping a food journal and adhering to recommended goals of 1300-1500 calories and 85 grams of protein daily and states she is following her eating plan approximately 60% of the time. Marisa [name] states she is doing 0 minutes 0 times per week.  Today's visit was #: 7 Starting weight: 216 lbs Starting date: 03/03/2019 Today's weight: 219 lbs Today's date: 06/07/2019 Total lbs lost to date: 0 Total lbs lost since last in-office visit: 0  Interim History: Marisa [name] was recently in [place] for the death of a family member and reports she was off plan. She is back to journaling and is journaling more frequently. She does get her protein in and has protein at every meal. She would like to start back going to the gym. She is not journaling foods such as cooking oils which is likely impeding weight loss.  Subjective:   1. Type 2 diabetes mellitus without complication, without long-term current use of insulin ([place]) Marisa [name]'s diabetes is well controlled with Rybelsus and metformin. She notes hypoglycemia with meal skipping. Her CBGs range between 94 and 110. She feels Rybelsus has decreased her night eating. Her Rybelsus was increased at her last visit. She notes constipation. Lab Results  Component Value Date   HGBA1C 5.6 03/03/2019   HGBA1C 5.5 10/20/2018   HGBA1C 5.4 12/29/2017   Lab Results  Component Value Date   MICROALBUR CANCELED 04/16/2016   LDLCALC 98 03/03/2019   CREATININE 0.48 (L) 03/03/2019     2. Vitamin D deficiency Marisa [name]'s last Vit D level was very low at 13.2 on 03/03/2019. She is on high dose Vit D.  Assessment/Plan:   1. Type 2 diabetes mellitus without complication, without long-term current use of insulin (HCC) Good blood sugar control is important to decrease the likelihood of diabetic  complications such as nephropathy, neuropathy, limb loss, blindness, coronary artery disease, and death. Intensive lifestyle modification including diet, exercise and weight loss are the first line of treatment for diabetes. Marisa [name] agreed to continue metformin and no skipping meals.  - Semaglutide (RYBELSUS) 7 MG TABS; Take 7 mg by mouth daily.  Dispense: 30 tablet; Refill: 0  2. Vitamin D deficiency Low Vitamin D level contributes to fatigue and are associated with obesity, breast, and colon cancer. We will refill prescription Vitamin D for 1 month. Marisa [name] will follow-up for routine testing of Vitamin D, at least 2-3 times per year to avoid over-replacement.  - Vitamin D, Ergocalciferol, (DRISDOL) 1.25 MG (50000 UNIT) CAPS capsule; Take 1 capsule (50,000 Units total) by mouth once a week.  Dispense: 4 capsule; Refill: 0  3. Class 2 severe obesity with serious comorbidity and body mass index (BMI) of 36.0 to 36.9 in adult, unspecified obesity type (HCC) Marisa [name] is currently in the action stage of change. As such, her goal is to continue with weight loss efforts. She has agreed to keeping a food journal and adhering to recommended goals of 1300-1500 calories and 85 grams of protein daily.   Marisa [name] is to include all foods, oils, and condiments in her journaling.  Exercise goals: Marisa [name] will start back at the gym 3 days per week.  Behavioral modification strategies: planning for success and keeping a strict food journal.  Marisa [name] has agreed to follow-up with our clinic in 3 weeks. She was informed of the importance of frequent  follow-up visits to maximize her success with intensive lifestyle modifications for her multiple health conditions.   Objective:   Blood pressure 122/80, pulse 87, temperature 98.4 F (36.9 C), temperature source Oral, height 5\' 5"  (1.651 m), weight 219 lb (99.3 kg), last menstrual period 11/27/2012, SpO2 99 %. Body mass index is 36.44 kg/m.  General:  Cooperative, alert, well developed, in no acute distress. HEENT: Conjunctivae and lids unremarkable. Cardiovascular: Regular rhythm.  Lungs: Normal work of breathing. Neurologic: No focal deficits.   Lab Results  Component Value Date   CREATININE 0.48 (L) 03/03/2019   BUN 10 03/03/2019   NA 140 03/03/2019   K 4.3 03/03/2019   CL 101 03/03/2019   CO2 22 03/03/2019   Lab Results  Component Value Date   ALT 22 03/03/2019   AST 20 03/03/2019   ALKPHOS 100 03/03/2019   BILITOT 0.5 03/03/2019   Lab Results  Component Value Date   HGBA1C 5.6 03/03/2019   HGBA1C 5.5 10/20/2018   HGBA1C 5.4 12/29/2017   HGBA1C 5.9 (H) 04/08/2017   HGBA1C 6.2 12/17/2016   Lab Results  Component Value Date   INSULIN 12.7 03/03/2019   Lab Results  Component Value Date   TSH 0.740 03/03/2019   Lab Results  Component Value Date   CHOL 186 03/03/2019   HDL 41 03/03/2019   LDLCALC 98 03/03/2019   TRIG 276 (H) 03/03/2019   CHOLHDL 4.6 (H) 10/20/2018   Lab Results  Component Value Date   WBC 5.9 03/03/2019   HGB 14.2 03/03/2019   HCT 43.2 03/03/2019   MCV 96 03/03/2019   PLT 433 03/03/2019   No results found for: IRON, TIBC, FERRITIN  Obesity Behavioral Intervention Documentation for Insurance:   Approximately 15 minutes were spent on the discussion below.  ASK: We discussed the diagnosis of obesity with Marisa [name] today and Marisa [name] agreed to give [name] permission to discuss obesity behavioral modification therapy today.  ASSESS: Marisa [name] has the diagnosis of obesity and her BMI today is 36.44. Marisa [name] is in the action stage of change.   ADVISE: Marisa [name] was educated on the multiple health risks of obesity as well as the benefit of weight loss to improve her health. She was advised of the need for long term treatment and the importance of lifestyle modifications to improve her current health and to decrease her risk of future health problems.  AGREE: Multiple dietary modification  options and treatment options were discussed and Marisa [name] agreed to follow the recommendations documented in the above note.  ARRANGE: Marisa [name] was educated on the importance of frequent visits to treat obesity as outlined per CMS and USPSTF guidelines and agreed to schedule her next follow up appointment today.  Attestation Statements:   Reviewed by clinician on day of visit: allergies, medications, problem list, medical history, surgical history, family history, social history, and previous encounter notes.   Wilhemena Durie, am acting as Location manager for [employer], FNP-C.  I have reviewed the above documentation for accuracy and completeness, and I agree with the above. -  Georgianne Fick, FNP

## 2019-06-19 DIAGNOSIS — J4520 Mild intermittent asthma, uncomplicated: Secondary | ICD-10-CM | POA: Diagnosis not present

## 2019-06-28 ENCOUNTER — Ambulatory Visit (INDEPENDENT_AMBULATORY_CARE_PROVIDER_SITE_OTHER): Payer: Medicare Other | Admitting: Family Medicine

## 2019-06-28 ENCOUNTER — Encounter (INDEPENDENT_AMBULATORY_CARE_PROVIDER_SITE_OTHER): Payer: Self-pay

## 2019-07-13 ENCOUNTER — Emergency Department (HOSPITAL_COMMUNITY)
Admission: EM | Admit: 2019-07-13 | Discharge: 2019-07-13 | Disposition: A | Discharge status: Home / Self Care | Payer: Medicare Other | Attending: Emergency Medicine | Admitting: Emergency Medicine

## 2019-07-13 ENCOUNTER — Encounter (HOSPITAL_COMMUNITY): Payer: Self-pay | Admitting: Emergency Medicine

## 2019-07-13 ENCOUNTER — Other Ambulatory Visit: Payer: Self-pay

## 2019-07-13 DIAGNOSIS — G4489 Other headache syndrome: Secondary | ICD-10-CM | POA: Diagnosis not present

## 2019-07-13 DIAGNOSIS — R52 Pain, unspecified: Secondary | ICD-10-CM | POA: Diagnosis not present

## 2019-07-13 DIAGNOSIS — Z5321 Procedure and treatment not carried out due to patient leaving prior to being seen by health care provider: Secondary | ICD-10-CM | POA: Diagnosis not present

## 2019-07-13 DIAGNOSIS — R110 Nausea: Secondary | ICD-10-CM | POA: Insufficient documentation

## 2019-07-13 DIAGNOSIS — R0902 Hypoxemia: Secondary | ICD-10-CM | POA: Diagnosis not present

## 2019-07-13 DIAGNOSIS — R519 Headache, unspecified: Secondary | ICD-10-CM | POA: Insufficient documentation

## 2019-07-13 DIAGNOSIS — R0602 Shortness of breath: Secondary | ICD-10-CM | POA: Diagnosis not present

## 2019-07-13 NOTE — ED Notes (Signed)
Pt asked for IV to be removed and eloped from waiting area.

## 2019-07-13 NOTE — ED Triage Notes (Signed)
Per EMS, patient from home, c/o headache with nausea x2 days. Hx migraines. Ibuprofen without relief. Hx asthma. Reports breathing tx PTA.   20g R wrist 4mg  Zofran 125mg  solumedrol   BP 116/71 HR 90 O2 96%

## 2019-07-14 ENCOUNTER — Ambulatory Visit (INDEPENDENT_AMBULATORY_CARE_PROVIDER_SITE_OTHER): Payer: Medicare Other | Admitting: Gastroenterology

## 2019-07-14 ENCOUNTER — Encounter: Payer: Self-pay | Admitting: Gastroenterology

## 2019-07-14 VITALS — BP 122/88 | HR 80 | Ht 65.0 in | Wt 223.0 lb

## 2019-07-14 DIAGNOSIS — K648 Other hemorrhoids: Secondary | ICD-10-CM | POA: Diagnosis not present

## 2019-07-14 DIAGNOSIS — K529 Noninfective gastroenteritis and colitis, unspecified: Secondary | ICD-10-CM | POA: Diagnosis not present

## 2019-07-14 MED ORDER — DICYCLOMINE HCL 10 MG PO CAPS: 10.0000 mg | ORAL_CAPSULE | Freq: Two times a day (BID) | ORAL | 1 refills | Status: DC

## 2019-07-14 NOTE — Patient Instructions (Signed)
If you are age 45 or older, your body mass index should be between 23-30. Your Body mass index is 37.11 kg/m. If this is out of the aforementioned range listed, please consider follow up with your Primary Care Provider.  If you are age 34 or younger, your body mass index should be between 19-25. Your Body mass index is 37.11 kg/m. If this is out of the aformentioned range listed, please consider follow up with your Primary Care Provider.    We will send a referral to [facility] to see Dr. Leighton Ruff, they will reach out to schedule.  [facility] is located at [address], [place].   It was a pleasure to see you today!  Dr. Loletha Carrow

## 2019-07-14 NOTE — Progress Notes (Signed)
GI Progress Note  Chief Complaint: Rectal bleeding  Subjective  History: Seen in clinic March 2018 for rectal bleeding, see note for details.  Internal hemorrhoids noted on prior colonoscopy at [facility], previous sphincterotomy by colorectal surgery for anal fissure.  My exam revealed a healing posterior anal fissure. Colonoscopy done at [facility] (history of difficult IV access) June 2018 revealed a diminutive transverse colon adenoma, diverticulosis and small internal hemorrhoids.  Seen August 2020 after some ED visits with chest and epigastric pain recurring with shortness of breath and feelings of anxiety.  She was having some a.m. reflux symptoms as well.  She continued to have generalized abdominal pain and postprandial bloating, postprandial urgency for loose nonbloody BMs.  There were suspected to be a functional component to the abdominal pain, exam revealed generalized tenderness to light palpation of abdominal wall.  EGD September 2020 normal.  Marisa [name] is bothered by diarrhea and rectal bleeding.  Her bowel habits do tend to alternate, but most days now she has 2 or 3 semiformed to loose BMs and she feels this is exacerbating hemorrhoidal bleeding.  She has some pain in the lower back that is like a pressure with bowel movements.  She does not have any anal pain with bowel movements, and is having bleeding 2 or 3 times a week. She really does not know whether or not the bowel habits changed with Glucophage or semaglutide According to the most recent primary care note, these medicines have worked well to control her diabetes. ROS: Cardiovascular:  no chest pain Respiratory: no dyspnea Mood stable Remainder systems negative except as above The patient's Past Medical, Family and Social History were reviewed and are on file in the EMR.  Objective:  Med list reviewed  Current Outpatient Medications:  .  albuterol (VENTOLIN HFA) 108 (90 Base) MCG/ACT  inhaler, Inhale 2 puffs into the lungs every 6 (six) hours as needed for wheezing or shortness of breath., Disp: 18 g, Rfl: 11 .  gabapentin (NEURONTIN) 300 MG capsule, Take 1 capsule (300 mg total) by mouth 3 (three) times daily., Disp: 270 capsule, Rfl: 1 .  ibuprofen (ADVIL) 600 MG tablet, Take 1 tablet (600 mg total) by mouth every 8 (eight) hours as needed., Disp: 30 tablet, Rfl: 0 .  metFORMIN (GLUCOPHAGE) 500 MG tablet, Take 1 tablet (500 mg total) by mouth 2 (two) times daily with a meal., Disp: 60 tablet, Rfl: 5 .  promethazine (PHENERGAN) 25 MG tablet, Take 25 mg by mouth every 6 (six) hours as needed for nausea or vomiting., Disp: , Rfl:  .  Semaglutide (RYBELSUS) 7 MG TABS, Take 7 mg by mouth daily., Disp: 30 tablet, Rfl: 0 .  sertraline (ZOLOFT) 50 MG tablet, Take 50 mg by mouth at bedtime., Disp: , Rfl:  .  sucralfate (CARAFATE) 1 g tablet, Take 1 g by mouth 4 (four) times daily -  with meals and at bedtime., Disp: , Rfl:  .  Vitamin D, Ergocalciferol, (DRISDOL) 1.25 MG (50000 UNIT) CAPS capsule, Take 1 capsule (50,000 Units total) by mouth once a week., Disp: 4 capsule, Rfl: 0 .  dicyclomine (BENTYL) 10 MG capsule, Take 1 capsule (10 mg total) by mouth 2 (two) times daily., Disp: 60 capsule, Rfl: 1   Vital signs in last 24 hrs: Vitals:   07/14/19 0928  BP: 122/88  Pulse: 80    Physical Exam Entire exam chaperoned by Magdalene River, CMA  HEENT: sclera anicteric, oral mucosa moist  without lesions  Neck: supple, no thyromegaly, JVD or lymphadenopathy  Cardiac: RRR without murmurs, S1S2 heard, no peripheral edema  Pulm: clear to auscultation bilaterally, normal RR and effort noted  Abdomen: soft, mild LLQ tenderness, with active bowel sounds. No guarding or palpable hepatosplenomegaly.  Skin; warm and dry, no jaundice or rash Rectal: Normal external, no fissure, but palpable posterior scarring from prior fissure as previously noticed.  No tenderness, no palpable internal  lesion.  No prolapse of hemorrhoidal tissue with Valsalva Anoscopy reveals enlarged internal hemorrhoid columns. Labs:   ___________________________________________ Radiologic studies:   ____________________________________________ Other:   _____________________________________________ Assessment & Plan  Assessment: Encounter Diagnoses  Name Primary?  . Chronic diarrhea Yes  . Bleeding internal hemorrhoids    Altered bowel habits, perhaps side effect of medicines as noted above, and I believe she has underlying IBS based on her clinical picture over years.  It sounds like these meds have been very helpful for her diabetes, so will be difficult to make a case to stop them.  The diarrhea may be contributing to more frequent hemorrhoidal prolapse and bleeding.   Plan: Trial of dicyclomine 10 mg twice daily.  I want to be careful about the dose because she has tended toward intermittent constipation in the past. She is referred back to Dr. Henri Medal at [facility] surgery to evaluate the hemorrhoids for either banding or hemorrhoidectomy.   30 minutes were spent on this encounter (including chart review, history/exam, counseling/coordination of care, and documentation)  [name]

## 2019-07-20 DIAGNOSIS — J4520 Mild intermittent asthma, uncomplicated: Secondary | ICD-10-CM | POA: Diagnosis not present

## 2019-08-31 ENCOUNTER — Ambulatory Visit: Payer: Medicare Other | Admitting: Physician Assistant

## 2019-08-31 ENCOUNTER — Other Ambulatory Visit: Payer: Self-pay

## 2019-08-31 VITALS — BP 135/98 | HR 95 | Ht 65.0 in | Wt 226.0 lb

## 2019-08-31 DIAGNOSIS — R140 Abdominal distension (gaseous): Secondary | ICD-10-CM | POA: Diagnosis not present

## 2019-08-31 DIAGNOSIS — R197 Diarrhea, unspecified: Secondary | ICD-10-CM

## 2019-08-31 DIAGNOSIS — Z6836 Body mass index (BMI) 36.0-36.9, adult: Secondary | ICD-10-CM

## 2019-08-31 DIAGNOSIS — R1084 Generalized abdominal pain: Secondary | ICD-10-CM | POA: Diagnosis not present

## 2019-08-31 DIAGNOSIS — K219 Gastro-esophageal reflux disease without esophagitis: Secondary | ICD-10-CM | POA: Diagnosis not present

## 2019-08-31 MED ORDER — PANTOPRAZOLE SODIUM 40 MG PO TBEC
40.0000 mg | DELAYED_RELEASE_TABLET | Freq: Every day | ORAL | 3 refills | Status: DC
Start: 2019-08-31 — End: 2020-04-09

## 2019-08-31 NOTE — Progress Notes (Signed)
Established Patient Office Visit  Subjective:  Patient ID: Marisa [name], female    DOB: [birth year]  Age: 44 y.o. MRN: [id number]  CC:  Chief Complaint  Patient presents with  . Abdominal Pain    HPI Marisa [name] reports that she has been having generalized abdominal pain for the last month, increased bloating, heartburn and acid reflux, diarrhea.  Reports that she feels that she is retaining water, states that she has had increased episodes of nocturia, reports weight increased from 2 10-2 26 without any changes in her diet.  Reports her stomach feels hard and swollen.  Describes her abdominal pain as something is stretching and pulling inside her stomach.  Has tried Bentyl, ibuprofen, Tylenol, without relief  Reports that she has been having loose stools that she describes as diarrhea for the past 3 weeks, states that she will have abdominal pain and cramping before and after the episodes, states that several episodes a day, including nighttime awakenings.  Has tried Maalox and Imodium without relief.  Reports that she has been having heartburn and acid reflux on a daily basis, has tried Maalox without relief Reports a dairy free diet, overall healthy diet had been working on weight loss.  Denies any sick contacts, no one else at home with same symptoms, no recent antibiotic use, drinks alkaline water, on city water at home, no pets in the house.  Denies fever but endorses chills on and off   Past Medical History:  Diagnosis Date  . Abdominal pain 04/26/2013  . Abnormal uterine bleeding (AUB) 10/11/2012  . Acute bronchitis   . Allergy   . Anal pain    chronic  . Anemia   . Anxiety   . Asthma    exacerbation 02-28-2014 and 02-23-2014 secondary to Rhinovirus  . Atypical chest pain 04/26/2013  . Benign neoplasm of sigmoid colon   . Benign neoplasm of transverse colon   . Carbuncle of labium 07/12/2015  . Chest pain 02/20/2014  . Chronic diarrhea   . Chronic  headaches   . Chronic low back pain   . Cigarette nicotine dependence without complication 05/10/4008  . Cyst of right ovary   . Dandruff 03/26/2015  . Depression 03/27/2014  . Diabetes mellitus without complication ([place]) 2/72/5366  . Difficult intravenous access    PER PT NEEDS PICC LINE  . Dyspnea 09/07/2012   [name] 08/2012:  No obstruction by FEV1%, but probable restriction.    . Falls 03/27/2014  . Food allergy    Mushrooms, shellfish  . GAD (generalized anxiety disorder) 05/04/2014  . Gait disturbance 04/11/2014  . Gait instability   . GERD (gastroesophageal reflux disease)   . History of adenomatous polyp of colon   . History of cardiac arrest    during SVD 1992  . History of ectopic pregnancy    2009-  S/P LEFT SALPINGECTOMY  . History of panic attacks   . Hyperlipidemia   . IBS (irritable bowel syndrome)   . Insomnia 03/06/2014  . Joint pain   . Lower extremity edema   . Lumbar stenosis L4 -- L5 with bulging disk   w/ right leg weakness/ decreased mobility  . Migraine variant with headache 05/09/2014  . Mild obstructive sleep apnea    study 03-20-2014  no cpap recommended  . Neuromuscular disorder (HCC)    neuropathy in feet   . Neuropathic pain of both legs 06/04/2016  . Obesity (BMI 30-39.9) 03/05/2018  . OSA (obstructive sleep apnea) 03/06/2014  .  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  . Panniculitis 03/05/2018  . Pelvic pain 07/25/2013  . Persistent vomiting 04/27/2013  . PTSD (post-traumatic stress disorder) 05/04/2014  . Rash and nonspecific skin eruption 07/12/2015  . Rectal bleeding 07/25/2013  . RLQ abdominal pain   . S/P Total vaginal hysterectomy on 01/06/13 01/06/2013  . Severe recurrent major depressive disorder with psychotic features ([place]) 05/04/2014  . Sleep apnea    mild no cpap  . Social anxiety disorder 05/04/2014  . Sore throat 02/20/2014  . Stomach ulcer   . Tachycardia 02/20/2014  . Type 2 diabetes mellitus ([place])   . Weakness of right leg    FROM BACK  PROBLEM PER PT    Past Surgical History:  Procedure Laterality Date  . ABDOMINAL HYSTERECTOMY    . COLONOSCOPY Left 04/29/2013   Procedure: COLONOSCOPY;  Surgeon: Arta Silence, MD;  Location: [facility];  Service: Endoscopy;  Laterality: Left;  . COLONOSCOPY    . COLONOSCOPY WITH PROPOFOL N/A 08/01/2016   Procedure: COLONOSCOPY WITH PROPOFOL;  Surgeon: Doran Stabler, MD;  Location: [facility];  Service: Gastroenterology;  Laterality: N/A;  . ESOPHAGOGASTRODUODENOSCOPY (EGD) WITH PROPOFOL N/A 11/10/2018   Procedure: ESOPHAGOGASTRODUODENOSCOPY (EGD) WITH PROPOFOL;  Surgeon: Doran Stabler, MD;  Location: [facility];  Service: Gastroenterology;  Laterality: N/A;  . EVALUATION UNDER ANESTHESIA WITH FISTULECTOMY N/A 04/20/2014   Procedure: EXAM UNDER ANESTHESIA ;  Surgeon: Leighton Ruff, MD;  Location: [facility];  Service: General;  Laterality: N/A;  . FLEXIBLE SIGMOIDOSCOPY N/A 11/09/2013   Procedure: FLEXIBLE SIGMOIDOSCOPY;  Surgeon: Arta Silence, MD;  Location: [facility];  Service: Endoscopy;  Laterality: N/A;  . fupa removal     . LAPAROSCOPIC CHOLECYSTECTOMY  2005  . REFRACTIVE SURGERY    . SPHINCTEROTOMY N/A 04/20/2014   Procedure:  LATERAL INTERNAL SPHINCTEROTOMY;  Surgeon: Leighton Ruff, MD;  Location: [facility];  Service: General;  Laterality: N/A;  . TRANSTHORACIC ECHOCARDIOGRAM  12-30-2012   mild LVH/  ef 55-60%  . UNILATERAL SALPINGECTOMY  2009   laparotomy left salpingectomy-- ectopic preg.  Marland Kitchen UPPER GASTROINTESTINAL ENDOSCOPY    . VAGINAL HYSTERECTOMY N/A 01/06/2013   Procedure: HYSTERECTOMY VAGINAL;  Surgeon: Osborne Oman, MD;  Location: [facility];  Service: Gynecology;  Laterality: N/A;    Family History  Problem Relation Age of Onset  . Hypertension Mother   . Diabetes Mother   . Allergies Mother   . Heart disease Mother   . Clotting disorder Mother   . Stroke Mother   . Kidney disease Mother   . Thyroid disease  Mother   . Cancer Father   . Hyperlipidemia Father   . Hypertension Father   . Colon cancer Father   . Liver disease Father   . Heart disease Maternal Grandmother   . Breast cancer Maternal Grandmother 77  . Schizophrenia Sister   . Bipolar disorder Sister   . Clotting disorder Sister   . Bipolar disorder Brother   . Kidney disease Brother   . Bipolar disorder Sister   . Pancreatic cancer Maternal Aunt   . Prostate cancer Maternal Uncle   . Liver cancer Maternal Grandfather   . Rectal cancer Maternal Grandfather   . Liver cancer Paternal Grandfather   . Colon polyps Neg Hx   . Esophageal cancer Neg Hx   . Stomach cancer Neg Hx     Social History   Socioeconomic History  . Marital status: Widowed    Spouse name:  Not on file  . Number of children: 1  . Years of education: Not on file  . Highest education level: Not on file  Occupational History  . Occupation: disability  Tobacco Use  . Smoking status: Former Smoker    Packs/day: 0.20    Years: 11.00    Pack years: 2.20    Types: Cigarettes    Quit date: 11/17/2013    Years since quitting: 5.7  . Smokeless tobacco: Never Used  . Tobacco comment: 2 years quit  Vaping Use  . Vaping Use: Never used  Substance and Sexual Activity  . Alcohol use: No    Alcohol/week: 0.0 standard drinks  . Drug use: No  . Sexual activity: Yes    Birth control/protection: Surgical  Other Topics Concern  . Not on file  Social History Narrative   Lives with sister   Drinks no caffeine   Social Determinants of Health   Financial Resource Strain:   . Difficulty of Paying Living Expenses:   Food Insecurity:   . Worried About Charity fundraiser in the Last Year:   . Arboriculturist in the Last Year:   Transportation Needs:   . Film/video editor (Medical):   Marland Kitchen Lack of Transportation (Non-Medical):   Physical Activity:   . Days of Exercise per Week:   . Minutes of Exercise per Session:   Stress:   . Feeling of Stress :    Social Connections:   . Frequency of Communication with Friends and Family:   . Frequency of Social Gatherings with Friends and Family:   . Attends Religious Services:   . Active Member of Clubs or Organizations:   . Attends Archivist Meetings:   Marland Kitchen Marital Status:   Intimate Partner Violence:   . Fear of Current or Ex-Partner:   . Emotionally Abused:   Marland Kitchen Physically Abused:   . Sexually Abused:     Outpatient Medications Prior to Visit  Medication Sig Dispense Refill  . albuterol (VENTOLIN HFA) 108 (90 Base) MCG/ACT inhaler Inhale 2 puffs into the lungs every 6 (six) hours as needed for wheezing or shortness of breath. 18 g 11  . dicyclomine (BENTYL) 10 MG capsule Take 1 capsule (10 mg total) by mouth 2 (two) times daily. 60 capsule 1  . gabapentin (NEURONTIN) 300 MG capsule Take 1 capsule (300 mg total) by mouth 3 (three) times daily. 270 capsule 1  . ibuprofen (ADVIL) 600 MG tablet Take 1 tablet (600 mg total) by mouth every 8 (eight) hours as needed. 30 tablet 0  . metFORMIN (GLUCOPHAGE) 500 MG tablet Take 1 tablet (500 mg total) by mouth 2 (two) times daily with a meal. 60 tablet 5  . promethazine (PHENERGAN) 25 MG tablet Take 25 mg by mouth every 6 (six) hours as needed for nausea or vomiting.    . Semaglutide (RYBELSUS) 7 MG TABS Take 7 mg by mouth daily. 30 tablet 0  . sertraline (ZOLOFT) 50 MG tablet Take 50 mg by mouth at bedtime.    . sucralfate (CARAFATE) 1 g tablet Take 1 g by mouth 4 (four) times daily -  with meals and at bedtime.    . Vitamin D, Ergocalciferol, (DRISDOL) 1.25 MG (50000 UNIT) CAPS capsule Take 1 capsule (50,000 Units total) by mouth once a week. 4 capsule 0   No facility-administered medications prior to visit.    Allergies  Allergen Reactions  . Asa [Aspirin] Anaphylaxis    Hives, chest  tightness   . Mushroom Extract Complex Anaphylaxis, Swelling and Other (See Comments)    Reaction:  Eye swelling  . Penicillins Anaphylaxis and Other  (See Comments)    Has patient had a PCN reaction causing immediate rash, facial/tongue/throat swelling, SOB or lightheadedness with hypotension: Yes Has patient had a PCN reaction causing severe rash involving mucus membranes or skin necrosis: No Has patient had a PCN reaction that required hospitalization No Has patient had a PCN reaction occurring within the last 10 years: No If all of the above answers are "NO", then may proceed with Cephalosporin use.  . Shellfish Allergy Anaphylaxis  . Triamcinolone Other (See Comments)    Skin issues     ROS Review of Systems  Constitutional: Positive for chills. Negative for fever.  HENT: Negative.   Eyes: Negative.   Respiratory: Negative.   Cardiovascular: Negative.   Gastrointestinal: Positive for abdominal pain and diarrhea. Negative for blood in stool, nausea and vomiting.  Endocrine: Negative.   Genitourinary: Negative.   Musculoskeletal: Negative.   Skin: Negative.   Allergic/Immunologic: Negative.   Neurological: Negative.   Hematological: Negative.   Psychiatric/Behavioral: Negative.       Objective:    Physical Exam Vitals and nursing note reviewed.  Constitutional:      General: She is not in acute distress.    Appearance: She is well-developed. She is obese. She is not ill-appearing.  HENT:     Head: Normocephalic and atraumatic.     Mouth/Throat:     Mouth: Mucous membranes are moist.     Pharynx: Oropharynx is clear.  Eyes:     Extraocular Movements: Extraocular movements intact.     Pupils: Pupils are equal, round, and reactive to light.  Cardiovascular:     Rate and Rhythm: Normal rate and regular rhythm.     Heart sounds: Normal heart sounds.  Pulmonary:     Effort: Pulmonary effort is normal.     Breath sounds: Normal breath sounds.  Abdominal:     General: There is distension.     Tenderness: There is generalized abdominal tenderness.     Comments: Generalized tenderness to slight touch  Skin:     General: Skin is warm and dry.  Neurological:     General: No focal deficit present.     Mental Status: She is alert and oriented to person, place, and time.  Psychiatric:        Mood and Affect: Mood normal.        Behavior: Behavior normal.     Ht 5\' 5"  (1.651 m)   Wt 226 lb (102.5 kg)   LMP 11/27/2012   BMI 37.61 kg/m  Wt Readings from Last 3 Encounters:  08/31/19 226 lb (102.5 kg)  07/14/19 223 lb (101.2 kg)  06/07/19 219 lb (99.3 kg)     Health Maintenance Due  Topic Date Due  . PNEUMOCOCCAL POLYSACCHARIDE VACCINE AGE 39-64 HIGH RISK  Never done  . FOOT EXAM  Never done  . COVID-19 Vaccine (1) Never done  . TETANUS/TDAP  Never done  . PAP SMEAR-Modifier  Never done  . OPHTHALMOLOGY EXAM  04/30/2017  . HEMOGLOBIN A1C  08/31/2019    There are no preventive care reminders to display for this patient.  Lab Results  Component Value Date   TSH 0.740 03/03/2019   Lab Results  Component Value Date   WBC 5.9 03/03/2019   HGB 14.2 03/03/2019   HCT 43.2 03/03/2019   MCV 96 03/03/2019  PLT 433 03/03/2019   Lab Results  Component Value Date   NA 140 03/03/2019   K 4.3 03/03/2019   CO2 22 03/03/2019   GLUCOSE 85 03/03/2019   BUN 10 03/03/2019   CREATININE 0.48 (L) 03/03/2019   BILITOT 0.5 03/03/2019   ALKPHOS 100 03/03/2019   AST 20 03/03/2019   ALT 22 03/03/2019   PROT 7.8 03/03/2019   ALBUMIN 4.6 03/03/2019   CALCIUM 9.6 03/03/2019   ANIONGAP 16 (H) 12/18/2018   Lab Results  Component Value Date   CHOL 186 03/03/2019   Lab Results  Component Value Date   HDL 41 03/03/2019   Lab Results  Component Value Date   LDLCALC 98 03/03/2019   Lab Results  Component Value Date   TRIG 276 (H) 03/03/2019   Lab Results  Component Value Date   CHOLHDL 4.6 (H) 10/20/2018   Lab Results  Component Value Date   HGBA1C 5.6 03/03/2019      Assessment & Plan:   Problem List Items Addressed This Visit    None    1. Generalized abdominal  pain Encouraged trial of Protonix, bland diet, [facility] for stool studies.  Follow-up in mobile unit in 1 week - Comp. Metabolic Panel (12) - H Pylori, IGM, IGG, IGA AB - DG Abd 2 Views; Future - Ova and parasite examination - C difficile Toxins A+B W/Rflx  2. Diarrhea, unspecified type  - DG Abd 2 Views; Future - Ova and parasite examination - C difficile Toxins A+B W/Rflx  3. Gastroesophageal reflux disease without esophagitis  - H Pylori, IGM, IGG, IGA AB  4. Class 2 severe obesity with serious comorbidity and body mass index (BMI) of 36.0 to 36.9 in adult, unspecified obesity type ([place])   5. Abdominal bloating   I have reviewed the patient's medical history (PMH, PSH, Social History, Family History, Medications, and allergies) , and have been updated if relevant. I spent 30 minutes reviewing chart and  face to face time with patient.     No orders of the defined types were placed in this encounter.   Follow-up: No follow-ups on file.    Loraine Grip Mayers, PA-C

## 2019-08-31 NOTE — Patient Instructions (Signed)
I encourage you to start taking Protonix once daily, follow a GERD friendly diet, complete the stool studies and have the abdominal x-ray completed.  We will call you with your results, please plan on following up in the mobile unit next week for further review.  Please let us know if there is anything else we can do for you  Marisa Rad, PA-C Physician Assistant [place] [email or web address]   Loperamide tablets or capsules What is this medicine? LOPERAMIDE (loe PER a mide) is used to treat diarrhea. This medicine may be used for other purposes; ask your health care provider or pharmacist if you have questions. COMMON BRAND NAME(S): Anti-Diarrheal, Imodium A-D, K-Pek II What should I tell my health care provider before I take this medicine? They need to know if you have any of these conditions:  a black or bloody stool  bacterial food poisoning  colitis or mucus in your stool  currently taking an antibiotic medication for an infection  fever  history of irregular heartbeat  liver disease  severe abdominal pain, swelling or bulging  an unusual or allergic reaction to loperamide, other medicines, foods, dyes, or preservatives  pregnant or trying to get pregnant  breast-feeding How should I use this medicine? Take this medicine by mouth with a glass of water. Follow the directions on the prescription label. Take your doses at regular intervals. Do not take your medicine more often than directed. Talk to your pediatrician regarding the use of this medicine in children. Special care may be needed. Overdosage: If you think you have taken too much of this medicine contact a poison control center or emergency room at once. NOTE: This medicine is only for you. Do not share this medicine with others. What if I miss a dose? This does not apply. This medicine is not for regular use. Only take this medicine while you  continue to have loose bowel movements. Do not take more medicine than recommended by the packaging label or by your healthcare professional. What may interact with this medicine? Do not take this medicine with any of the following medications:   alosetron This medicine may also interact with the following medications:  cimetidine  clarithromycin  erythromycin  gemfibrozil  itraconazole  ketoconazole  quinidine  quinine  ranitidine  ritonavir  saquinavir This list may not describe all possible interactions. Give your health care provider a list of all the medicines, herbs, non-prescription drugs, or dietary supplements you use. Also tell them if you smoke, drink alcohol, or use illegal drugs. Some items may interact with your medicine. What should I watch for while using this medicine? Do not take this medicine for more than 2 days without asking your doctor or health care professional. Do not use doses higher than those prescribed by your doctor or listed on the label. Check with your doctor or health care professional right away if you develop a fever, severe abdominal pain, swelling or bulging, or if you have have bloody/black diarrhea or stools. You may get drowsy or dizzy. Do not drive, use machinery, or do anything that needs mental alertness until you know how this medicine affects you. Do not stand or sit up quickly, especially if you are an older patient. This reduces the risk of dizzy or fainting spells. Alcohol can increase possible drowsiness and dizziness. Avoid alcoholic drinks. Your mouth may get dry. Chewing sugarless gum or sucking hard candy, and drinking plenty of water may help. Contact your doctor if the  problem does not go away or is severe. Drinking plenty of water can also help prevent dehydration that can occur with diarrhea. Elderly patients may have a more variable response to the effects of this medicine, and are more susceptible to the effects of  dehydration. What side effects may I notice from receiving this medicine? Side effects that you should report to your doctor or health care professional as soon as possible:   allergic reactions like skin rash, itching or hives, swelling of the face, lips, or tongue  bloated, swollen feeling in your abdomen  blurred vision  loss of appetite  signs and symptoms of a dangerous change in heartbeat or heart rhythm like chest pain; dizziness; fast or irregular heartbeat palpitations; feeling faint or lightheaded, falls; breathing problems  stomach pain Side effects that usually do not require medical attention (report to your doctor or health care professional if they continue or are bothersome):   constipation  drowsiness or dizziness  dry mouth  nausea, vomiting This list may not describe all possible side effects. Call your doctor for medical advice about side effects. You may report side effects to FDA at 1-800-FDA-1088. Where should I keep my medicine? Keep out of the reach of children. Store at room temperature between 15 and 25 degrees C (59 and 77 degrees F). Keep container tightly closed. Throw away any unused medicine after the expiration date. NOTE: This sheet is a summary. It may not cover all possible information. If you have questions about this medicine, talk to your doctor, pharmacist, or health care provider.  2020 Elsevier/Gold Standard (2017-04-27 11:30:48)  Abdominal Bloating When you have abdominal bloating, your abdomen may feel full, tight, or painful. It may also look bigger than normal or swollen (distended). Common causes of abdominal bloating include:  Swallowing air.  Constipation.  Problems digesting food.  Eating too much.  Irritable bowel syndrome. This is a condition that affects the large intestine.  Lactose intolerance. This is an inability to digest lactose, a natural sugar in dairy products.  Celiac disease. This is a condition that  affects the ability to digest gluten, a protein found in some grains.  Gastroparesis. This is a condition that slows down the movement of food in the stomach and small intestine. It is more common in people with diabetes mellitus.  Gastroesophageal reflux disease (GERD). This is a digestive condition that makes stomach acid flow back into the esophagus.  Urinary retention. This means that the body is holding onto urine, and the bladder cannot be emptied all the way. Follow these instructions at home: Eating and drinking  Avoid eating too much.  Try not to swallow air while talking or eating.  Avoid eating while lying down.  Avoid these foods and drinks: ? Foods that cause gas, such as broccoli, cabbage, cauliflower, and baked beans. ? Carbonated drinks. ? Hard candy. ? Chewing gum. Medicines  Take over-the-counter and prescription medicines only as told by your health care provider.  Take probiotic medicines. These medicines contain live bacteria or yeasts that can help digestion.  Take coated peppermint oil capsules. Activity  Try to exercise regularly. Exercise may help to relieve bloating that is caused by gas and relieve constipation. General instructions  Keep all follow-up visits as told by your health care provider. This is important. Contact a health care provider if:  You have nausea and vomiting.  You have diarrhea.  You have abdominal pain.  You have unusual weight loss or weight gain.  You  have severe pain, and medicines do not help. Get help right away if:  You have severe chest pain.  You have trouble breathing.  You have shortness of breath.  You have trouble urinating.  You have darker urine than normal.  You have blood in your stools or have dark, tarry stools. Summary  Abdominal bloating means that the abdomen is swollen.  Common causes of abdominal bloating are swallowing air, constipation, and problems digesting food.  Avoid eating  too much and avoid swallowing air.  Avoid foods that cause gas, carbonated drinks, hard candy, and chewing gum. This information is not intended to replace advice given to you by your health care provider. Make sure you discuss any questions you have with your health care provider. Document Revised: 05/24/2018 Document Reviewed: 03/07/2016 Elsevier Patient Education  [place].

## 2019-09-01 ENCOUNTER — Ambulatory Visit (HOSPITAL_COMMUNITY)
Admission: RE | Admit: 2019-09-01 | Discharge: 2019-09-01 | Disposition: A | Discharge status: Home / Self Care | Payer: Medicare Other | Source: Ambulatory Visit | Attending: Physician Assistant | Admitting: Physician Assistant

## 2019-09-01 DIAGNOSIS — R1084 Generalized abdominal pain: Secondary | ICD-10-CM | POA: Diagnosis not present

## 2019-09-01 DIAGNOSIS — Z9049 Acquired absence of other specified parts of digestive tract: Secondary | ICD-10-CM | POA: Diagnosis not present

## 2019-09-01 DIAGNOSIS — R109 Unspecified abdominal pain: Secondary | ICD-10-CM | POA: Diagnosis not present

## 2019-09-01 DIAGNOSIS — R197 Diarrhea, unspecified: Secondary | ICD-10-CM | POA: Diagnosis not present

## 2019-09-01 DIAGNOSIS — R162 Hepatomegaly with splenomegaly, not elsewhere classified: Secondary | ICD-10-CM | POA: Diagnosis not present

## 2019-09-02 LAB — COMP. METABOLIC PANEL (12)
AST: 13 IU/L (ref 0–40)
Albumin/Globulin Ratio: 1.5 (ref 1.2–2.2)
Albumin: 4.1 g/dL (ref 3.8–4.8)
Alkaline Phosphatase: 81 IU/L (ref 48–121)
BUN/Creatinine Ratio: 14 (ref 9–23)
BUN: 13 mg/dL (ref 6–24)
Bilirubin Total: 0.3 mg/dL (ref 0.0–1.2)
Calcium: 9.2 mg/dL (ref 8.7–10.2)
Chloride: 102 mmol/L (ref 96–106)
Creatinine, Ser: 0.93 mg/dL (ref 0.57–1.00)
GFR calc Af Amer: 86 mL/min/{1.73_m2} (ref >59)
GFR calc non Af Amer: 74 mL/min/{1.73_m2} (ref >59)
Globulin, Total: 2.8 g/dL (ref 1.5–4.5)
Glucose: 123 mg/dL — ABNORMAL HIGH (ref 65–99)
Potassium: 4.4 mmol/L (ref 3.5–5.2)
Sodium: 140 mmol/L (ref 134–144)
Total Protein: 6.9 g/dL (ref 6.0–8.5)

## 2019-09-02 LAB — H PYLORI, IGM, IGG, IGA AB
H pylori, IgM Abs: <9 units (ref 0.0–8.9)
H. pylori, IgA Abs: 16.7 units — ABNORMAL HIGH (ref 0.0–8.9)
H. pylori, IgG AbS: 6.15 Index Value — ABNORMAL HIGH (ref 0.00–0.79)

## 2019-09-04 ENCOUNTER — Other Ambulatory Visit (INDEPENDENT_AMBULATORY_CARE_PROVIDER_SITE_OTHER): Payer: Self-pay | Admitting: Family Medicine

## 2019-09-04 DIAGNOSIS — E1169 Type 2 diabetes mellitus with other specified complication: Secondary | ICD-10-CM

## 2019-09-04 LAB — OVA AND PARASITE EXAMINATION

## 2019-09-05 LAB — C DIFFICILE, CYTOTOXIN B

## 2019-09-05 LAB — C DIFFICILE TOXINS A+B W/RFLX: C difficile Toxins A+B, EIA: NEGATIVE

## 2019-09-06 ENCOUNTER — Ambulatory Visit: Payer: Medicare Other | Admitting: Physician Assistant

## 2019-09-06 ENCOUNTER — Other Ambulatory Visit: Payer: Self-pay

## 2019-09-06 VITALS — BP 119/85 | HR 84 | Temp 98.7°F | Resp 18 | Ht 62.0 in

## 2019-09-06 DIAGNOSIS — R162 Hepatomegaly with splenomegaly, not elsewhere classified: Secondary | ICD-10-CM | POA: Diagnosis not present

## 2019-09-06 DIAGNOSIS — A048 Other specified bacterial intestinal infections: Secondary | ICD-10-CM | POA: Diagnosis not present

## 2019-09-06 DIAGNOSIS — R1084 Generalized abdominal pain: Secondary | ICD-10-CM

## 2019-09-06 DIAGNOSIS — Z6836 Body mass index (BMI) 36.0-36.9, adult: Secondary | ICD-10-CM

## 2019-09-06 DIAGNOSIS — R739 Hyperglycemia, unspecified: Secondary | ICD-10-CM | POA: Diagnosis not present

## 2019-09-06 LAB — POCT GLYCOSYLATED HEMOGLOBIN (HGB A1C): Hemoglobin A1C: 5.6 % (ref 4.0–5.6)

## 2019-09-06 MED ORDER — TETRACYCLINE HCL 500 MG PO CAPS: 500.0000 mg | ORAL_CAPSULE | Freq: Four times a day (QID) | ORAL | 0 refills | Status: AC

## 2019-09-06 MED ORDER — METRONIDAZOLE 500 MG PO TABS: 500.0000 mg | ORAL_TABLET | Freq: Three times a day (TID) | ORAL | 0 refills | Status: AC

## 2019-09-06 NOTE — Progress Notes (Signed)
Established Patient Office Visit  Subjective:  Patient ID: Marisa [name], female    DOB: 1975/04/01  Age: 44 y.o. MRN: [id number]  CC:  Chief Complaint  Patient presents with  . Follow-up    HPI Marisa [name] presents for lab results.  Patient was seen in the mobile unit last week, was found to be positive for H. pylori, imaging showed moderate hepatosplenomegaly.  Reports that she was unable to start the Protonix due to not picking up medication.  Reports condition is unchanged from last week continues to have abdominal bloating after each meal, retaining fluids.  Past Medical History:  Diagnosis Date  . Abdominal pain 04/26/2013  . Abnormal uterine bleeding (AUB) 10/11/2012  . Acute bronchitis   . Allergy   . Anal pain    chronic  . Anemia   . Anxiety   . Asthma    exacerbation 02-28-2014 and 02-23-2014 secondary to Rhinovirus  . Atypical chest pain 04/26/2013  . Benign neoplasm of sigmoid colon   . Benign neoplasm of transverse colon   . Carbuncle of labium 07/12/2015  . Chest pain 02/20/2014  . Chronic diarrhea   . Chronic headaches   . Chronic low back pain   . Cigarette nicotine dependence without complication 5/62/5638  . Cyst of right ovary   . Dandruff 03/26/2015  . Depression 03/27/2014  . Diabetes mellitus without complication ([place]) 9/37/3428  . Difficult intravenous access    PER PT NEEDS PICC LINE  . Dyspnea 09/07/2012   [name] 08/2012:  No obstruction by FEV1%, but probable restriction.    . Falls 03/27/2014  . Food allergy    Mushrooms, shellfish  . GAD (generalized anxiety disorder) 05/04/2014  . Gait disturbance 04/11/2014  . Gait instability   . GERD (gastroesophageal reflux disease)   . History of adenomatous polyp of colon   . History of cardiac arrest    during SVD 1992  . History of ectopic pregnancy    2009-  S/P LEFT SALPINGECTOMY  . History of panic attacks   . Hyperlipidemia   . IBS (irritable bowel syndrome)   . Insomnia  03/06/2014  . Joint pain   . Lower extremity edema   . Lumbar stenosis L4 -- L5 with bulging disk   w/ right leg weakness/ decreased mobility  . Migraine variant with headache 05/09/2014  . Mild obstructive sleep apnea    study 03-20-2014  no cpap recommended  . Neuromuscular disorder (HCC)    neuropathy in feet   . Neuropathic pain of both legs 06/04/2016  . Obesity (BMI 30-39.9) 03/05/2018  . OSA (obstructive sleep apnea) 03/06/2014  . 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  . Panniculitis 03/05/2018  . Pelvic pain 07/25/2013  . Persistent vomiting 04/27/2013  . PTSD (post-traumatic stress disorder) 05/04/2014  . Rash and nonspecific skin eruption 07/12/2015  . Rectal bleeding 07/25/2013  . RLQ abdominal pain   . S/P Total vaginal hysterectomy on 01/06/13 01/06/2013  . Severe recurrent major depressive disorder with psychotic features ([place]) 05/04/2014  . Sleep apnea    mild no cpap  . Social anxiety disorder 05/04/2014  . Sore throat 02/20/2014  . Stomach ulcer   . Tachycardia 02/20/2014  . Type 2 diabetes mellitus ([place])   . Weakness of right leg    FROM BACK PROBLEM PER PT    Past Surgical History:  Procedure Laterality Date  . ABDOMINAL HYSTERECTOMY    . COLONOSCOPY Left 04/29/2013   Procedure: COLONOSCOPY;  Surgeon: Gwyndolyn Saxon  Paulita Fujita, MD;  Location: [facility];  Service: Endoscopy;  Laterality: Left;  . COLONOSCOPY    . COLONOSCOPY WITH PROPOFOL N/A 08/01/2016   Procedure: COLONOSCOPY WITH PROPOFOL;  Surgeon: Doran Stabler, MD;  Location: [facility];  Service: Gastroenterology;  Laterality: N/A;  . ESOPHAGOGASTRODUODENOSCOPY (EGD) WITH PROPOFOL N/A 11/10/2018   Procedure: ESOPHAGOGASTRODUODENOSCOPY (EGD) WITH PROPOFOL;  Surgeon: Doran Stabler, MD;  Location: [facility];  Service: Gastroenterology;  Laterality: N/A;  . EVALUATION UNDER ANESTHESIA WITH FISTULECTOMY N/A 04/20/2014   Procedure: EXAM UNDER ANESTHESIA ;  Surgeon: Leighton Ruff, MD;  Location: [facility];  Service: General;  Laterality: N/A;  . FLEXIBLE SIGMOIDOSCOPY N/A 11/09/2013   Procedure: FLEXIBLE SIGMOIDOSCOPY;  Surgeon: Arta Silence, MD;  Location: [facility];  Service: Endoscopy;  Laterality: N/A;  . fupa removal     . LAPAROSCOPIC CHOLECYSTECTOMY  2005  . REFRACTIVE SURGERY    . SPHINCTEROTOMY N/A 04/20/2014   Procedure:  LATERAL INTERNAL SPHINCTEROTOMY;  Surgeon: Leighton Ruff, MD;  Location: [facility];  Service: General;  Laterality: N/A;  . TRANSTHORACIC ECHOCARDIOGRAM  12-30-2012   mild LVH/  ef 55-60%  . UNILATERAL SALPINGECTOMY  2009   laparotomy left salpingectomy-- ectopic preg.  Marland Kitchen UPPER GASTROINTESTINAL ENDOSCOPY    . VAGINAL HYSTERECTOMY N/A 01/06/2013   Procedure: HYSTERECTOMY VAGINAL;  Surgeon: Osborne Oman, MD;  Location: [facility];  Service: Gynecology;  Laterality: N/A;    Family History  Problem Relation Age of Onset  . Hypertension Mother   . Diabetes Mother   . Allergies Mother   . Heart disease Mother   . Clotting disorder Mother   . Stroke Mother   . Kidney disease Mother   . Thyroid disease Mother   . Cancer Father   . Hyperlipidemia Father   . Hypertension Father   . Colon cancer Father   . Liver disease Father   . Heart disease Maternal Grandmother   . Breast cancer Maternal Grandmother 30  . Schizophrenia Sister   . Bipolar disorder Sister   . Clotting disorder Sister   . Bipolar disorder Brother   . Kidney disease Brother   . Bipolar disorder Sister   . Pancreatic cancer Maternal Aunt   . Prostate cancer Maternal Uncle   . Liver cancer Maternal Grandfather   . Rectal cancer Maternal Grandfather   . Liver cancer Paternal Grandfather   . Colon polyps Neg Hx   . Esophageal cancer Neg Hx   . Stomach cancer Neg Hx     Social History   Socioeconomic History  . Marital status: Widowed    Spouse name: Not on file  . Number of children: 1  . Years of education: Not on file  . Highest education level: Not on  file  Occupational History  . Occupation: disability  Tobacco Use  . Smoking status: Former Smoker    Packs/day: 0.20    Years: 11.00    Pack years: 2.20    Types: Cigarettes    Quit date: 11/17/2013    Years since quitting: 5.8  . Smokeless tobacco: Never Used  . Tobacco comment: 2 years quit  Vaping Use  . Vaping Use: Never used  Substance and Sexual Activity  . Alcohol use: No    Alcohol/week: 0.0 standard drinks  . Drug use: No  . Sexual activity: Yes    Birth control/protection: Surgical  Other Topics Concern  . Not on file  Social History Narrative   Lives  with sister   Drinks no caffeine   Social Determinants of Radio broadcast assistant Strain:   . Difficulty of Paying Living Expenses:   Food Insecurity:   . Worried About Charity fundraiser in the Last Year:   . Arboriculturist in the Last Year:   Transportation Needs:   . Film/video editor (Medical):   Marland Kitchen Lack of Transportation (Non-Medical):   Physical Activity:   . Days of Exercise per Week:   . Minutes of Exercise per Session:   Stress:   . Feeling of Stress :   Social Connections:   . Frequency of Communication with Friends and Family:   . Frequency of Social Gatherings with Friends and Family:   . Attends Religious Services:   . Active Member of Clubs or Organizations:   . Attends Archivist Meetings:   Marland Kitchen Marital Status:   Intimate Partner Violence:   . Fear of Current or Ex-Partner:   . Emotionally Abused:   Marland Kitchen Physically Abused:   . Sexually Abused:     Outpatient Medications Prior to Visit  Medication Sig Dispense Refill  . albuterol (VENTOLIN HFA) 108 (90 Base) MCG/ACT inhaler Inhale 2 puffs into the lungs every 6 (six) hours as needed for wheezing or shortness of breath. 18 g 11  . dicyclomine (BENTYL) 10 MG capsule Take 1 capsule (10 mg total) by mouth 2 (two) times daily. 60 capsule 1  . gabapentin (NEURONTIN) 300 MG capsule Take 1 capsule (300 mg total) by mouth 3  (three) times daily. 270 capsule 1  . metFORMIN (GLUCOPHAGE) 500 MG tablet Take 1 tablet (500 mg total) by mouth 2 (two) times daily with a meal. 60 tablet 5  . pantoprazole (PROTONIX) 40 MG tablet Take 1 tablet (40 mg total) by mouth daily. 30 tablet 3  . promethazine (PHENERGAN) 25 MG tablet Take 25 mg by mouth every 6 (six) hours as needed for nausea or vomiting.    . Semaglutide (RYBELSUS) 7 MG TABS Take 7 mg by mouth daily. 30 tablet 0  . sucralfate (CARAFATE) 1 g tablet Take 1 g by mouth 4 (four) times daily -  with meals and at bedtime.    . Vitamin D, Ergocalciferol, (DRISDOL) 1.25 MG (50000 UNIT) CAPS capsule Take 1 capsule (50,000 Units total) by mouth once a week. 4 capsule 0  . ibuprofen (ADVIL) 600 MG tablet Take 1 tablet (600 mg total) by mouth every 8 (eight) hours as needed. (Patient not taking: Reported on 09/06/2019) 30 tablet 0  . sertraline (ZOLOFT) 50 MG tablet Take 50 mg by mouth at bedtime. (Patient not taking: Reported on 09/06/2019)     No facility-administered medications prior to visit.    Allergies  Allergen Reactions  . Asa [Aspirin] Anaphylaxis    Hives, chest tightness   . Mushroom Extract Complex Anaphylaxis, Swelling and Other (See Comments)    Reaction:  Eye swelling  . Penicillins Anaphylaxis and Other (See Comments)    Has patient had a PCN reaction causing immediate rash, facial/tongue/throat swelling, SOB or lightheadedness with hypotension: Yes Has patient had a PCN reaction causing severe rash involving mucus membranes or skin necrosis: No Has patient had a PCN reaction that required hospitalization No Has patient had a PCN reaction occurring within the last 10 years: No If all of the above answers are "NO", then may proceed with Cephalosporin use.  . Shellfish Allergy Anaphylaxis  . Triamcinolone Other (See Comments)  Skin issues     ROS Review of Systems  Constitutional: Negative for fever.  HENT: Negative.   Eyes: Negative.   Respiratory:  Negative.   Cardiovascular: Negative.   Gastrointestinal: Positive for abdominal distention and abdominal pain. Negative for nausea and vomiting.  Endocrine: Negative.   Genitourinary: Negative.   Musculoskeletal: Negative.   Skin: Negative.   Allergic/Immunologic: Negative.   Neurological: Negative.   Hematological: Negative.   Psychiatric/Behavioral: Negative.       Objective:    Physical Exam Constitutional:      General: She is not in acute distress.    Appearance: Normal appearance. She is obese. She is not ill-appearing.  HENT:     Head: Normocephalic and atraumatic.     Right Ear: External ear normal.     Left Ear: External ear normal.     Nose: Nose normal.     Mouth/Throat:     Mouth: Mucous membranes are moist.     Pharynx: Oropharynx is clear.  Eyes:     Extraocular Movements: Extraocular movements intact.     Conjunctiva/sclera: Conjunctivae normal.     Pupils: Pupils are equal, round, and reactive to light.  Cardiovascular:     Rate and Rhythm: Normal rate and regular rhythm.     Pulses: Normal pulses.     Heart sounds: Normal heart sounds.  Pulmonary:     Effort: Pulmonary effort is normal.     Breath sounds: Normal breath sounds.  Abdominal:     Tenderness: There is abdominal tenderness.  Musculoskeletal:        General: Normal range of motion.     Cervical back: Normal range of motion.  Skin:    General: Skin is warm and dry.  Neurological:     General: No focal deficit present.     Mental Status: She is alert and oriented to person, place, and time.  Psychiatric:        Mood and Affect: Mood normal.        Behavior: Behavior normal.        Thought Content: Thought content normal.        Judgment: Judgment normal.     BP 119/85 (BP Location: Left Arm, Patient Position: Sitting, Cuff Size: Normal)   Pulse 84   Temp 98.7 F (37.1 C) (Oral)   Resp 18   Ht 5\' 2"  (1.575 m)   LMP 11/27/2012   SpO2 98%   BMI 41.34 kg/m  Wt Readings from Last  3 Encounters:  08/31/19 226 lb (102.5 kg)  07/14/19 223 lb (101.2 kg)  06/07/19 219 lb (99.3 kg)     Health Maintenance Due  Topic Date Due  . PNEUMOCOCCAL POLYSACCHARIDE VACCINE AGE 47-64 HIGH RISK  Never done  . FOOT EXAM  Never done  . COVID-19 Vaccine (1) Never done  . TETANUS/TDAP  Never done  . PAP SMEAR-Modifier  Never done  . OPHTHALMOLOGY EXAM  04/30/2017    There are no preventive care reminders to display for this patient.  Lab Results  Component Value Date   TSH 0.740 03/03/2019   Lab Results  Component Value Date   WBC 5.9 03/03/2019   HGB 14.2 03/03/2019   HCT 43.2 03/03/2019   MCV 96 03/03/2019   PLT 433 03/03/2019   Lab Results  Component Value Date   NA 140 08/31/2019   K 4.4 08/31/2019   CO2 22 03/03/2019   GLUCOSE 123 (H) 08/31/2019   BUN 13 08/31/2019  CREATININE 0.93 08/31/2019   BILITOT 0.3 08/31/2019   ALKPHOS 81 08/31/2019   AST 13 08/31/2019   ALT 22 03/03/2019   PROT 6.9 08/31/2019   ALBUMIN 4.1 08/31/2019   CALCIUM 9.2 08/31/2019   ANIONGAP 16 (H) 12/18/2018   Lab Results  Component Value Date   CHOL 186 03/03/2019   Lab Results  Component Value Date   HDL 41 03/03/2019   Lab Results  Component Value Date   LDLCALC 98 03/03/2019   Lab Results  Component Value Date   TRIG 276 (H) 03/03/2019   Lab Results  Component Value Date   CHOLHDL 4.6 (H) 10/20/2018   Lab Results  Component Value Date   HGBA1C 5.6 09/06/2019      Assessment & Plan:   Problem List Items Addressed This Visit    None    Visit Diagnoses    History of diabetes mellitus    -  Primary   Relevant Orders   HgB A1c (Completed)   Congenital hypertrophic pyloric stenosis          Meds ordered this encounter  Medications  . metroNIDAZOLE (FLAGYL) 500 MG tablet    Sig: Take 1 tablet (500 mg total) by mouth 3 (three) times daily for 14 days.    Dispense:  42 tablet    Refill:  0    Order Specific Question:   Supervising Provider    Answer:    Joya Gaskins, PATRICK E [[id number]]  . tetracycline (SUMYCIN) 500 MG capsule    Sig: Take 1 capsule (500 mg total) by mouth 4 (four) times daily for 14 days.    Dispense:  56 capsule    Refill:  0    Order Specific Question:   Supervising Provider    Answer:   WRIGHT, PATRICK E [[id number]  1. History of diabetes mellitus Fasting blood glucose was elevated, A1c 5.6.  Encouraged patient to follow Mediterranean-style low sugar diet - HgB A1c  2. H. Pylori infection Patient with penicillin allergy, trial Pepto-Bismol 300 mg 4 times a day, Flagyl 500 mg 3 times a day, tetracycline 500 mg 4 times a day, all for 14 days, encouraged GERD lifestyle modifications, avoid NSAIDs, begin Protonix once daily, continue Protonix for the next 6 weeks.   I have reviewed the patient's medical history (PMH, PSH, Social History, Family History, Medications, and allergies) , and have been updated if relevant. I spent 30 minutes reviewing chart and  face to face time with patient.      Follow-up: Return in about 6 weeks (around 10/18/2019).    Loraine Grip Mayers, PA-C

## 2019-09-06 NOTE — Patient Instructions (Addendum)
You are going to use 300 mg of Pepto-Bismol 4 times a day, Flagyl 500 mg 3 times a day, and tetracycline 500 mg 4 times a day.  You will also use Protonix on a daily basis.  You will continue this regimen for 2 weeks.  You will continue the Protonix for at least 6 weeks.  Please plan on returning in 6 weeks for further review.  Kennieth Rad, PA-C Physician Assistant [facility] [email or web address]    Food Choices for Gastroesophageal Reflux Disease, Adult When you have gastroesophageal reflux disease (GERD), the foods you eat and your eating habits are very important. Choosing the right foods can help ease your discomfort. Think about working with a nutrition specialist (dietitian) to help you make good choices. What are tips for following this plan?  Meals  Choose healthy foods that are low in fat, such as fruits, vegetables, whole grains, low-fat dairy products, and lean meat, fish, and poultry.  Eat small meals often instead of 3 large meals a day. Eat your meals slowly, and in a place where you are relaxed. Avoid bending over or lying down until 2-3 hours after eating.  Avoid eating meals 2-3 hours before bed.  Avoid drinking a lot of liquid with meals.  Cook foods using methods other than frying. Bake, grill, or broil food instead.  Avoid or limit: ? Chocolate. ? Peppermint or spearmint. ? Alcohol. ? Pepper. ? Black and decaffeinated coffee. ? Black and decaffeinated tea. ? Bubbly (carbonated) soft drinks. ? Caffeinated energy drinks and soft drinks.  Limit high-fat foods such as: ? Fatty meat or fried foods. ? Whole milk, cream, butter, or ice cream. ? Nuts and nut butters. ? Pastries, donuts, and sweets made with butter or shortening.  Avoid foods that cause symptoms. These foods may be different for everyone. Common foods that cause symptoms include: ? Tomatoes. ? Oranges, lemons, and  limes. ? Peppers. ? Spicy food. ? Onions and garlic. ? Vinegar. Lifestyle  Maintain a healthy weight. Ask your doctor what weight is healthy for you. If you need to lose weight, work with your doctor to do so safely.  Exercise for at least 30 minutes for 5 or more days each week, or as told by your doctor.  Wear loose-fitting clothes.  Do not smoke. If you need help quitting, ask your doctor.  Sleep with the head of your bed higher than your feet. Use a wedge under the mattress or blocks under the bed frame to raise the head of the bed. Summary  When you have gastroesophageal reflux disease (GERD), food and lifestyle choices are very important in easing your symptoms.  Eat small meals often instead of 3 large meals a day. Eat your meals slowly, and in a place where you are relaxed.  Limit high-fat foods such as fatty meat or fried foods.  Avoid bending over or lying down until 2-3 hours after eating.  Avoid peppermint and spearmint, caffeine, alcohol, and chocolate. This information is not intended to replace advice given to you by your health care provider. Make sure you discuss any questions you have with your health care provider.   Helicobacter Pylori Infection Helicobacter pylori infection is a bacterial infection in the stomach. Long-term (chronic) infection can cause stomach irritation (gastritis), ulcers in the stomach (gastric ulcers), and ulcers in the upper part of the intestine (duodenal ulcers). Having this infection may also increase your risk of stomach cancer and a type of white blood cell cancer (  lymphoma) that affects the stomach. What are the causes? This infection is caused by the Helicobacter pylori (H. pylori) bacteria. Many healthy people have this bacteria in their stomach lining. The bacteria may also spread from person to person through contact with stool (feces) or saliva. It is not known why some people develop ulcers, gastritis, or cancer from the  bacteria. What increases the risk? You are more likely to develop this condition if you:  Have family members with the infection.  Live with many other people, such as in a dormitory.  Are of African, Hispanic, or Asian descent. What are the signs or symptoms? Most people with this infection do not have any symptoms. If you do have symptoms, they may include:  Heartburn.  Stomach pain.  Nausea.  Vomiting. The vomit may be bloody because of ulcers.  Loss of appetite.  Bad breath. How is this diagnosed? This condition may be diagnosed based on:  Your symptoms and medical history.  A physical exam.  Blood tests.  Stool tests.  A breath test.  A procedure that involves placing a tube with a camera on the end of it down your throat to examine your stomach and upper intestine (upper endoscopy).  Removing and testing a tissue sample from the stomach lining (biopsy). A biopsy may be taken during an upper endoscopy. How is this treated?  This condition is treated by taking a combination of medicines (triple therapy) for several weeks. Triple therapy includes one medicine to reduce the amount of acid in your stomach and two types of antibiotic medicines. This treatment may reduce your risk of cancer. You may need to be tested for H. pylori again after treatment. In some cases, the treatment may need to be repeated if your treatment did not get rid of all the bacteria. Follow these instructions at home:   Take over-the-counter and prescription medicines only as told by your health care provider.  Take your antibiotics as told by your health care provider. Do not stop taking the antibiotics even if you start to feel better.  Return to your normal activities as told by your health care provider. Ask your health care provider what activities are safe for you.  Take steps to prevent future infections: ? Wash your hands often with soap and water. If soap and water are not  available, use hand sanitizer. ? Do not eat food or drink water that may have had contact with stool or saliva.  Keep all follow-up visits as told by your health care provider. This is important. You may need tests to make sure your treatment worked. Contact a health care provider if your symptoms:  Do not get better with treatment.  Return after treatment. Summary  Helicobacter pylori infection is a stomach infection caused by the Helicobacter pylori (H. pylori) bacteria.  This infection can cause stomach irritation (gastritis), ulcers in the stomach (gastric ulcers), and ulcers in the upper part of the intestine (duodenal ulcers).  This condition is treated by taking a combination of medicines (triple therapy) for several weeks.  Take your antibiotics as told by your health care provider. Do not stop taking the antibiotics even if you start to feel better. This information is not intended to replace advice given to you by your health care provider. Make sure you discuss any questions you have with your health care provider. Document Revised: 05/27/2018 Document Reviewed: 01/27/2017 Elsevier Patient Education  [place].  Document Revised: 05/27/2018 Document Reviewed: 03/11/2016 Elsevier Patient  Education  2020 [employer]  

## 2019-10-05 ENCOUNTER — Other Ambulatory Visit (INDEPENDENT_AMBULATORY_CARE_PROVIDER_SITE_OTHER): Payer: Self-pay | Admitting: Family Medicine

## 2019-10-05 DIAGNOSIS — E1169 Type 2 diabetes mellitus with other specified complication: Secondary | ICD-10-CM

## 2019-10-05 DIAGNOSIS — E559 Vitamin D deficiency, unspecified: Secondary | ICD-10-CM

## 2019-11-07 ENCOUNTER — Telehealth: Payer: Self-pay | Admitting: Family Medicine

## 2019-11-07 NOTE — Telephone Encounter (Signed)
Appt has been scheduled.   Copied from [place] [phone]. Topic: General - Other >> Nov 04, 2019 11:13 AM Leward Quan A wrote: Reason for CRM: Patient called to schedule an appointment with Dr Chapman Fitch needing to discuss nerve pain and getting a recommendation for weight loss surgery. Please call to schedule visit at Ph# [phone]

## 2019-11-25 ENCOUNTER — Encounter: Payer: Self-pay | Admitting: Family Medicine

## 2019-11-25 ENCOUNTER — Other Ambulatory Visit: Payer: Self-pay

## 2019-11-25 ENCOUNTER — Ambulatory Visit: Payer: Medicare Other | Attending: Family Medicine | Admitting: Family Medicine

## 2019-11-25 VITALS — BP 111/78 | HR 74 | Temp 97.0°F | Ht 65.0 in | Wt 231.4 lb

## 2019-11-25 DIAGNOSIS — R29898 Other symptoms and signs involving the musculoskeletal system: Secondary | ICD-10-CM

## 2019-11-25 DIAGNOSIS — E1140 Type 2 diabetes mellitus with diabetic neuropathy, unspecified: Secondary | ICD-10-CM

## 2019-11-25 DIAGNOSIS — G629 Polyneuropathy, unspecified: Secondary | ICD-10-CM | POA: Diagnosis not present

## 2019-11-25 DIAGNOSIS — Z79899 Other long term (current) drug therapy: Secondary | ICD-10-CM

## 2019-11-25 DIAGNOSIS — Z6838 Body mass index (BMI) 38.0-38.9, adult: Secondary | ICD-10-CM

## 2019-11-25 DIAGNOSIS — F410 Panic disorder [episodic paroxysmal anxiety] without agoraphobia: Secondary | ICD-10-CM

## 2019-11-25 DIAGNOSIS — J4520 Mild intermittent asthma, uncomplicated: Secondary | ICD-10-CM

## 2019-11-25 DIAGNOSIS — M79645 Pain in left finger(s): Secondary | ICD-10-CM

## 2019-11-25 DIAGNOSIS — M4802 Spinal stenosis, cervical region: Secondary | ICD-10-CM

## 2019-11-25 DIAGNOSIS — G5793 Unspecified mononeuropathy of bilateral lower limbs: Secondary | ICD-10-CM

## 2019-11-25 DIAGNOSIS — Z8619 Personal history of other infectious and parasitic diseases: Secondary | ICD-10-CM

## 2019-11-25 DIAGNOSIS — E781 Pure hyperglyceridemia: Secondary | ICD-10-CM

## 2019-11-25 DIAGNOSIS — R350 Frequency of micturition: Secondary | ICD-10-CM

## 2019-11-25 DIAGNOSIS — F411 Generalized anxiety disorder: Secondary | ICD-10-CM

## 2019-11-25 DIAGNOSIS — R5383 Other fatigue: Secondary | ICD-10-CM

## 2019-11-25 LAB — POCT URINALYSIS DIP (CLINITEK)
Bilirubin, UA: NEGATIVE
Blood, UA: NEGATIVE
Glucose, UA: NEGATIVE mg/dL
Ketones, POC UA: NEGATIVE mg/dL
Leukocytes, UA: NEGATIVE
Nitrite, UA: NEGATIVE
POC PROTEIN,UA: NEGATIVE
Spec Grav, UA: 1.025
Urobilinogen, UA: 0.2 U/dL
pH, UA: 5

## 2019-11-25 MED ORDER — GABAPENTIN 300 MG PO CAPS: ORAL_CAPSULE | ORAL | 1 refills | Status: DC

## 2019-11-25 MED ORDER — ALPRAZOLAM 1 MG PO TABS: ORAL_TABLET | ORAL | 0 refills | Status: DC

## 2019-11-25 NOTE — Patient Instructions (Signed)
Spinal Stenosis  Spinal stenosis happens when the open space (spinal canal) between the bones of your spine (vertebrae) gets smaller. It is caused by bone pushing into the open spaces of your backbone (spine). This puts pressure on your backbone and the nerves in your backbone. Treatment often focuses on managing any pain and symptoms. In some cases, surgery may be needed. Follow these instructions at home: Managing pain, stiffness, and swelling   Do all exercises and stretches as told by your doctor.  Stand and sit up straight (use good posture). If you were given a brace or a corset, wear it as told by your doctor.  Do not do any activities that cause pain. Ask your doctor what activities are safe for you.  Do not lift anything that is heavier than 10 lb (4.5 kg) or heavier than your doctor tells you.  Try to stay at a healthy weight. Talk with your doctor if you need help losing weight.  If directed, put heat on the affected area as often as told by your doctor. Use the heat source that your doctor recommends, such as a moist heat pack or a heating pad. ? Put a towel between your skin and the heat source. ? Leave the heat on for 20-30 minutes. ? Remove the heat if your skin turns bright red. This is especially important if you are not able to feel pain, heat, or cold. You may have a greater risk of getting burned. General instructions  Take over-the-counter and prescription medicines only as told by your doctor.  Do not use any products that contain nicotine or tobacco, such as cigarettes and e-cigarettes. If you need help quitting, ask your doctor.  Eat a healthy diet. This includes plenty of fruits and vegetables, whole grains, and low-fat (lean) protein.  Keep all follow-up visits as told by your doctor. This is important. Contact a doctor if:  Your symptoms do not get better.  Your symptoms get worse.  You have a fever. Get help right away if:  You have new or worse pain  in your neck or upper back.  You have very bad pain that medicine does not control.  You are dizzy.  You have vision problems, blurred vision, or double vision.  You have a very bad headache that is worse when you stand.  You feel sick to your stomach (nauseous).  You throw up (vomit).  You have new or worse numbness or tingling in your back or legs.  You have pain, redness, swelling, or warmth in your arm or leg. Summary  Spinal stenosis happens when the open space (spinal canal) between the bones of your spine gets smaller (narrow).  Contact a doctor if your symptoms get worse.  In some cases, surgery may be needed. This information is not intended to replace advice given to you by your health care provider. Make sure you discuss any questions you have with your health care provider. Document Revised: 01/16/2017 Document Reviewed: 01/09/2016 Elsevier Patient Education  2020 [employer]  

## 2019-11-25 NOTE — Progress Notes (Signed)
Established Patient Office Visit  Subjective:  Patient ID: Marisa [name], female    DOB: 07/14/1975  Age: 44 y.o. MRN: [id number]  CC:  Chief Complaint  Patient presents with  . Follow-up    HPI Marisa [name], 44 -year-old female with complex medical history including prior cardiac arrest, type 2 diabetes, asthma, obesity, sleep apnea, anxiety and depression, who presents secondary to the complaint of bilateral leg numbness as well as weakness in the left leg.  She reports that she has had some falls secondary to sudden onset of weakness in the left leg.  She reports that several weeks ago she fell and tried to break her fall with her right hand but ended up landed on an outstretched left hand as she has had issues with pain and decreased mobility in her left thumb since her fall.  She reports that she is having painful numbness in both legs.  She does not believe that this is related to her diabetes that she reports that her sugars have been fairly well controlled.  She has noticed however that despite her blood sugars being controlled that she has had some recent onset of frequent urination.  She denies any current issues with abdominal pain.  She has recently been treated for H. pylori and she would like to see if there is a test to see if the bacterial infection has cleared up after treatment.  She denies any epigastric tenderness, no burping, belching or backwash of bad tasting fluid into the mouth or throat since she completed treatment for the H. pylori infection.  She has had some increase in fatigue.  She has been attending the medical weight loss program but would like to be referred to bariatric surgeon as she is interested in weight loss surgery.  She reports that her anxiety and depressive symptoms are currently controlled on sertraline.  She will need medication however for anxiety before any enclosed imaging studies.  She was diagnosed with cervical spinal stenosis in  the past but has not had recent imaging in follow-up.   Past Medical History:  Diagnosis Date  . Abdominal pain 04/26/2013  . Abnormal uterine bleeding (AUB) 10/11/2012  . Acute bronchitis   . Allergy   . Anal pain    chronic  . Anemia   . Anxiety   . Asthma    exacerbation 02-28-2014 and 02-23-2014 secondary to Rhinovirus  . Atypical chest pain 04/26/2013  . Benign neoplasm of sigmoid colon   . Benign neoplasm of transverse colon   . Carbuncle of labium 07/12/2015  . Chest pain 02/20/2014  . Chronic diarrhea   . Chronic headaches   . Chronic low back pain   . Cigarette nicotine dependence without complication 09/26/1749  . Cyst of right ovary   . Dandruff 03/26/2015  . Depression 03/27/2014  . Diabetes mellitus without complication ([place]) 0/25/8527  . Difficult intravenous access    PER PT NEEDS PICC LINE  . Dyspnea 09/07/2012   [name] 08/2012:  No obstruction by FEV1%, but probable restriction.    . Falls 03/27/2014  . Food allergy    Mushrooms, shellfish  . GAD (generalized anxiety disorder) 05/04/2014  . Gait disturbance 04/11/2014  . Gait instability   . GERD (gastroesophageal reflux disease)   . History of adenomatous polyp of colon   . History of cardiac arrest    during SVD 1992  . History of ectopic pregnancy    2009-  S/P LEFT SALPINGECTOMY  . History  of panic attacks   . Hyperlipidemia   . IBS (irritable bowel syndrome)   . Insomnia 03/06/2014  . Joint pain   . Lower extremity edema   . Lumbar stenosis L4 -- L5 with bulging disk   w/ right leg weakness/ decreased mobility  . Migraine variant with headache 05/09/2014  . Mild obstructive sleep apnea    study 03-20-2014  no cpap recommended  . Neuromuscular disorder (HCC)    neuropathy in feet   . Neuropathic pain of both legs 06/04/2016  . Obesity (BMI 30-39.9) 03/05/2018  . OSA (obstructive sleep apnea) 03/06/2014  . 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  . Panniculitis 03/05/2018  . Pelvic pain 07/25/2013  .  Persistent vomiting 04/27/2013  . PTSD (post-traumatic stress disorder) 05/04/2014  . Rash and nonspecific skin eruption 07/12/2015  . Rectal bleeding 07/25/2013  . RLQ abdominal pain   . S/P Total vaginal hysterectomy on 01/06/13 01/06/2013  . Severe recurrent major depressive disorder with psychotic features ([place]) 05/04/2014  . Sleep apnea    mild no cpap  . Social anxiety disorder 05/04/2014  . Sore throat 02/20/2014  . Stomach ulcer   . Tachycardia 02/20/2014  . Type 2 diabetes mellitus ([place])   . Weakness of right leg    FROM BACK PROBLEM PER PT    Past Surgical History:  Procedure Laterality Date  . ABDOMINAL HYSTERECTOMY    . COLONOSCOPY Left 04/29/2013   Procedure: COLONOSCOPY;  Surgeon: Arta Silence, MD;  Location: [facility];  Service: Endoscopy;  Laterality: Left;  . COLONOSCOPY    . COLONOSCOPY WITH PROPOFOL N/A 08/01/2016   Procedure: COLONOSCOPY WITH PROPOFOL;  Surgeon: Doran Stabler, MD;  Location: [facility];  Service: Gastroenterology;  Laterality: N/A;  . ESOPHAGOGASTRODUODENOSCOPY (EGD) WITH PROPOFOL N/A 11/10/2018   Procedure: ESOPHAGOGASTRODUODENOSCOPY (EGD) WITH PROPOFOL;  Surgeon: Doran Stabler, MD;  Location: [facility];  Service: Gastroenterology;  Laterality: N/A;  . EVALUATION UNDER ANESTHESIA WITH FISTULECTOMY N/A 04/20/2014   Procedure: EXAM UNDER ANESTHESIA ;  Surgeon: Leighton Ruff, MD;  Location[facility];  Service: General;  Laterality: N/A;  . FLEXIBLE SIGMOIDOSCOPY N/A 11/09/2013   Procedure: FLEXIBLE SIGMOIDOSCOPY;  Surgeon: Arta Silence, MD;  Location: [facility];  Service: Endoscopy;  Laterality: N/A;  . fupa removal     . LAPAROSCOPIC CHOLECYSTECTOMY  2005  . REFRACTIVE SURGERY    . SPHINCTEROTOMY N/A 04/20/2014   Procedure:  LATERAL INTERNAL SPHINCTEROTOMY;  Surgeon: Leighton Ruff, MD;  Location: [facility];  Service: General;  Laterality: N/A;  . TRANSTHORACIC ECHOCARDIOGRAM  12-30-2012   mild LVH/  ef  55-60%  . UNILATERAL SALPINGECTOMY  2009   laparotomy left salpingectomy-- ectopic preg.  Marland Kitchen UPPER GASTROINTESTINAL ENDOSCOPY    . VAGINAL HYSTERECTOMY N/A 01/06/2013   Procedure: HYSTERECTOMY VAGINAL;  Surgeon: Osborne Oman, MD;  Location: [facility];  Service: Gynecology;  Laterality: N/A;    Family History  Problem Relation Age of Onset  . Hypertension Mother   . Diabetes Mother   . Allergies Mother   . Heart disease Mother   . Clotting disorder Mother   . Stroke Mother   . Kidney disease Mother   . Thyroid disease Mother   . Cancer Father   . Hyperlipidemia Father   . Hypertension Father   . Colon cancer Father   . Liver disease Father   . Heart disease Maternal Grandmother   . Breast cancer Maternal Grandmother 24  . Schizophrenia Sister   .  Bipolar disorder Sister   . Clotting disorder Sister   . Bipolar disorder Brother   . Kidney disease Brother   . Bipolar disorder Sister   . Pancreatic cancer Maternal Aunt   . Prostate cancer Maternal Uncle   . Liver cancer Maternal Grandfather   . Rectal cancer Maternal Grandfather   . Liver cancer Paternal Grandfather   . Colon polyps Neg Hx   . Esophageal cancer Neg Hx   . Stomach cancer Neg Hx     Social History   Socioeconomic History  . Marital status: Widowed    Spouse name: Not on file  . Number of children: 1  . Years of education: Not on file  . Highest education level: Not on file  Occupational History  . Occupation: disability  Tobacco Use  . Smoking status: Former Smoker    Packs/day: 0.20    Years: 11.00    Pack years: 2.20    Types: Cigarettes    Quit date: 11/17/2013    Years since quitting: 6.0  . Smokeless tobacco: Never Used  . Tobacco comment: 2 years quit  Vaping Use  . Vaping Use: Never used  Substance and Sexual Activity  . Alcohol use: No    Alcohol/week: 0.0 standard drinks  . Drug use: No  . Sexual activity: Yes    Birth control/protection: Surgical  Other Topics Concern  . Not on  file  Social History Narrative   Lives with sister   Drinks no caffeine   Social Determinants of Health   Financial Resource Strain:   . Difficulty of Paying Living Expenses: Not on file  Food Insecurity:   . Worried About Charity fundraiser in the Last Year: Not on file  . Ran Out of Food in the Last Year: Not on file  Transportation Needs:   . Lack of Transportation (Medical): Not on file  . Lack of Transportation (Non-Medical): Not on file  Physical Activity:   . Days of Exercise per Week: Not on file  . Minutes of Exercise per Session: Not on file  Stress:   . Feeling of Stress : Not on file  Social Connections:   . Frequency of Communication with Friends and Family: Not on file  . Frequency of Social Gatherings with Friends and Family: Not on file  . Attends Religious Services: Not on file  . Active Member of Clubs or Organizations: Not on file  . Attends Archivist Meetings: Not on file  . Marital Status: Not on file  Intimate Partner Violence:   . Fear of Current or Ex-Partner: Not on file  . Emotionally Abused: Not on file  . Physically Abused: Not on file  . Sexually Abused: Not on file    Outpatient Medications Prior to Visit  Medication Sig Dispense Refill  . albuterol (VENTOLIN HFA) 108 (90 Base) MCG/ACT inhaler Inhale 2 puffs into the lungs every 6 (six) hours as needed for wheezing or shortness of breath. 18 g 11  . dicyclomine (BENTYL) 10 MG capsule Take 1 capsule (10 mg total) by mouth 2 (two) times daily. 60 capsule 1  . gabapentin (NEURONTIN) 300 MG capsule Take 1 capsule (300 mg total) by mouth 3 (three) times daily. 270 capsule 1  . metFORMIN (GLUCOPHAGE) 500 MG tablet Take 1 tablet (500 mg total) by mouth 2 (two) times daily with a meal. 60 tablet 5  . pantoprazole (PROTONIX) 40 MG tablet Take 1 tablet (40 mg total) by mouth daily. [place]  tablet 3  . promethazine (PHENERGAN) 25 MG tablet Take 25 mg by mouth every 6 (six) hours as needed for nausea  or vomiting.    . Semaglutide (RYBELSUS) 7 MG TABS Take 7 mg by mouth daily. 30 tablet 0  . sertraline (ZOLOFT) 50 MG tablet Take 50 mg by mouth at bedtime.     . sucralfate (CARAFATE) 1 g tablet Take 1 g by mouth 4 (four) times daily -  with meals and at bedtime.    . Vitamin D, Ergocalciferol, (DRISDOL) 1.25 MG (50000 UNIT) CAPS capsule Take 1 capsule (50,000 Units total) by mouth once a week. 4 capsule 0  . ibuprofen (ADVIL) 600 MG tablet Take 1 tablet (600 mg total) by mouth every 8 (eight) hours as needed. (Patient not taking: Reported on 09/06/2019) 30 tablet 0   No facility-administered medications prior to visit.    Allergies  Allergen Reactions  . Asa [Aspirin] Anaphylaxis    Hives, chest tightness   . Mushroom Extract Complex Anaphylaxis, Swelling and Other (See Comments)    Reaction:  Eye swelling  . Penicillins Anaphylaxis and Other (See Comments)    Has patient had a PCN reaction causing immediate rash, facial/tongue/throat swelling, SOB or lightheadedness with hypotension: Yes Has patient had a PCN reaction causing severe rash involving mucus membranes or skin necrosis: No Has patient had a PCN reaction that required hospitalization No Has patient had a PCN reaction occurring within the last 10 years: No If all of the above answers are "NO", then may proceed with Cephalosporin use.  . Shellfish Allergy Anaphylaxis  . Triamcinolone Other (See Comments)    Skin issues     ROS Review of Systems  Constitutional: Positive for fatigue. Negative for chills and fever.  HENT: Negative for sore throat, trouble swallowing and voice change.   Eyes: Positive for visual disturbance (Wears glasses). Negative for photophobia.  Respiratory: Negative for cough and shortness of breath.   Cardiovascular: Negative for chest pain and palpitations.  Gastrointestinal: Negative for abdominal pain, blood in stool, constipation, diarrhea and nausea.  Endocrine: Positive for polyuria. Negative  for polydipsia and polyphagia.  Genitourinary: Positive for frequency. Negative for dysuria.  Musculoskeletal: Positive for arthralgias, back pain and gait problem.  Skin: Negative for rash and wound.  Neurological: Positive for weakness and numbness. Negative for dizziness and headaches.  Hematological: Negative for adenopathy. Does not bruise/bleed easily.  Psychiatric/Behavioral: Negative for suicidal ideas. The patient is nervous/anxious.       Objective:    Physical Exam Constitutional:      General: She is not in acute distress.    Appearance: Normal appearance. She is obese.  Neck:     Vascular: No carotid bruit.  Cardiovascular:     Rate and Rhythm: Normal rate and regular rhythm.  Pulmonary:     Effort: Pulmonary effort is normal.     Breath sounds: Normal breath sounds. No wheezing.  Abdominal:     Palpations: Abdomen is soft.     Tenderness: There is no abdominal tenderness. There is no right CVA tenderness, left CVA tenderness or guarding.  Musculoskeletal:        General: Tenderness (Patient with pain with palpation and movement of the left thumb.  She is unable to extend the left thumb against resistance.  Mild lumbosacral discomfort with palpation) present.     Cervical back: Normal range of motion and neck supple. No tenderness.     Right lower leg: No edema.  Left lower leg: No edema.  Lymphadenopathy:     Cervical: No cervical adenopathy.  Skin:    General: Skin is warm and dry.  Neurological:     Mental Status: She is alert and oriented to person, place, and time.     Motor: Weakness (Weakness with attempt at hip flexion/extension in left leg) present.  Psychiatric:        Mood and Affect: Mood normal.        Behavior: Behavior normal.     BP 111/78 (BP Location: Left Arm, Patient Position: Sitting)   Pulse 74   Temp (!) 97 F (36.1 C)   Ht 5\' 5"  (1.651 m)   Wt 231 lb 6.4 oz (105 kg)   LMP 11/27/2012   SpO2 100%   BMI 38.51 kg/m  Wt Readings  from Last 3 Encounters:  11/25/19 231 lb 6.4 oz (105 kg)  08/31/19 226 lb (102.5 kg)  07/14/19 223 lb (101.2 kg)     Health Maintenance Due  Topic Date Due  . PNEUMOCOCCAL POLYSACCHARIDE VACCINE AGE 11-64 HIGH RISK  Never done  . FOOT EXAM  Never done  . COVID-19 Vaccine (1) Never done  . TETANUS/TDAP  Never done  . PAP SMEAR-Modifier  Never done  . OPHTHALMOLOGY EXAM  04/30/2017  . INFLUENZA VACCINE  09/18/2019    Patient declined influenza immunization. She has had COVID immunization and information entered into chart. She has diabetic eye exam scheduled for November.    Lab Results  Component Value Date   TSH 0.740 03/03/2019   Lab Results  Component Value Date   WBC 5.9 03/03/2019   HGB 14.2 03/03/2019   HCT 43.2 03/03/2019   MCV 96 03/03/2019   PLT 433 03/03/2019   Lab Results  Component Value Date   NA 140 08/31/2019   K 4.4 08/31/2019   CO2 22 03/03/2019   GLUCOSE 123 (H) 08/31/2019   BUN 13 08/31/2019   CREATININE 0.93 08/31/2019   BILITOT 0.3 08/31/2019   ALKPHOS 81 08/31/2019   AST 13 08/31/2019   ALT 22 03/03/2019   PROT 6.9 08/31/2019   ALBUMIN 4.1 08/31/2019   CALCIUM 9.2 08/31/2019   ANIONGAP 16 (H) 12/18/2018   Lab Results  Component Value Date   CHOL 186 03/03/2019   Lab Results  Component Value Date   HDL 41 03/03/2019   Lab Results  Component Value Date   LDLCALC 98 03/03/2019   Lab Results  Component Value Date   TRIG 276 (H) 03/03/2019   Lab Results  Component Value Date   CHOLHDL 4.6 (H) 10/20/2018   Lab Results  Component Value Date   HGBA1C 5.6 09/06/2019      Assessment & Plan:  1. Cervical spinal stenosis; 2. Left leg weakness Patient with complaint of increasing difficulty with left leg weakness and abnormal gait along with recurrent falls.  She has also had worsening of neuropathic pain and numbness in both legs.  She does have a history of cervical spinal stenosis and on review of chart, discussed with patient  that her MRI in June 2016 showed moderate spinal stenosis at C4-5 and moderately severe left foraminal narrowing which could possibly lead to compression of the left C5 nerve root.  She will be scheduled for cervical MRI as she has not had dedicated cervical spine MRI since 2016.  She will be referred to neurosurgery for further evaluation and treatment.  Continue use of an assistive device such as a cane to  help reduce fall risk. - MR Cervical Spine Wo Contrast; Future - Ambulatory referral to Neurosurgery  3. Neuropathy Patient with worsening of bilateral lower extremity neuropathic pain and has a history of cervical spinal stenosis which is likely causing/contributing to her symptoms.  However she has had longstanding issues with diabetes and long-term use of Metformin which can reduce vitamin B12 levels as well as her recent positive test for H. pylori.  Will check vitamin B12 level to see if low vitamin B12/B12 deficiency may be contributing to her neuropathic symptoms. - Vitamin B12  4. Type 2 diabetes mellitus with diabetic neuropathy, without long-term current use of insulin ([place]) She reports that her blood sugars have been well controlled.  Hemoglobin A1c will be obtained in addition to comprehensive metabolic panel, urinalysis and lipid panel.  Discussed recommendation for statin therapy to help reduce the risk of cardiovascular disease associated with diabetes.  She reports that she is up-to-date on yearly diabetic eye exam. - Comprehensive metabolic panel - POCT URINALYSIS DIP ([facility]) - Lipid panel - Hemoglobin A1c  5. Hypertriglyceridemia Patient has had lipid panel in January and is not currently on any medication such as statin therapy.  In January of this year, she had lipid panel showing total cholesterol of 186 with triglycerides elevated at 276, normal HDL at 41 and LDL of 98.  Discussed LDL goal of 70 or less if she is diabetic.  She is not on daily aspirin therapy secondary to  allergy to aspirin which causes anaphylaxis. - Comprehensive metabolic panel - Lipid panel  6. Mild intermittent asthma without complication She reports that her asthma is controlled on her current medications. She declines influenza immunization.   7. Pain of left thumb Patient reports a fall within the past few months and has had continued pain with use of her thumb. She additionally has some tenderness over the anatomic snuffbox area therefore will have patient evaluated by Orthopedics.  - AMB referral to orthopedics  8. History of Helicobacter pylori infection She is status post positive H. pylori antibody test on 08/31/2019 and has completed therapy to eradicate H. pylori.  Will check H. pylori breath test at today's visit to make sure the infection has resolved.  We will also check CBC to look for any anemia which may be present secondary to gastritis.  She reports no current GI symptoms. - H. pylori breath test - CBC with Differential  9. Urinary frequency Urinalysis will be done in follow-up of patient's complaint of urinary frequency.  Patient's hemoglobin A1c at today's visit was near normal at 5.7 therefore it is unlikely that hypoglycemia is contributing to or causing her urinary symptoms. - POCT URINALYSIS DIP ([facility]IN[facility]K)  10. BMI 38.0-38.9,adult She has been participating in a medical weight loss program but would like to be referred for evaluation for bariatric surgery.  Referral placed. - Amb Referral to Bariatric Surgery  11. Encounter for long-term current use of medication Comprehensive metabolic panel will be done in follow-up of use of medications for treatment of neuropathy, diabetes and anxiety.  Liver enzymes also being checked prior to possible need for statin medication or medication to lower triglycerides. - Comprehensive metabolic panel  12. Fatigue, unspecified type She reports recent increase in fatigue.  She is encouraged to be compliant with the use of CPAP  for treatment of sleep apnea.  Continue a healthy diet and efforts at weight loss.  She will have repeat hemoglobin A1c in follow-up of her diabetes.  Will check  CBC to look for anemia or other blood disorder which may be causing her symptoms.  She is also aware that gabapentin for neuropathic pain can also cause sleepiness/fatigue.  She reports that she has completed vitamin D therapy for prior vitamin D deficiency.  We will also check comprehensive metabolic panel for electrolyte or liver abnormality which may be contributing to her symptoms. - CBC with Differential  13. Generalized anxiety disorder with panic attacks Patient has generalized anxiety disorder which is currently controlled on sertraline however due to her anxiety and claustrophobia, she requires medication to take prior to upcoming MRI.  Prescription provided for alprazolam 1 mg to take after she checks in for her MRI.  She is aware that she will have to have someone with her to drive her home after the MRI because she will be sleepy after taking the alprazolam. - ALPRAZolam (XANAX) 1 MG tablet; Take one pill 15-30 minutes prior to procedures  Dispense: 6 tablet; Refill: 0  14. Neuropathic pain of both legs Patient's dose of Neurontin has been increased from 300 mg 3 times daily to 600 mg 3 times daily to help with her increased neuropathic pain. - gabapentin (NEURONTIN) 300 MG capsule; Take 2 capsules (600 mg) three times per day  Dispense: 540 capsule; Refill: 1     Follow-up: Return in about 3 months (around 02/25/2020) for DM/chronic issues- sooner if needed.    Antony Blackbird, MD

## 2019-11-25 NOTE — Progress Notes (Signed)
Wants blood work  nerve pain in legs Wants weight loss surgery referral  Needs breast center referral for mammo Fluid retention  Previous stomach infection concerns

## 2019-11-26 LAB — COMPREHENSIVE METABOLIC PANEL WITH GFR
ALT: 27 IU/L (ref 0–32)
AST: 15 IU/L (ref 0–40)
Albumin/Globulin Ratio: 1.5 (ref 1.2–2.2)
Albumin: 4.6 g/dL (ref 3.8–4.8)
Alkaline Phosphatase: 67 IU/L (ref 44–121)
BUN/Creatinine Ratio: 14 (ref 9–23)
BUN: 10 mg/dL (ref 6–24)
Bilirubin Total: <0.2 mg/dL (ref 0.0–1.2)
CO2: 21 mmol/L (ref 20–29)
Calcium: 9.7 mg/dL (ref 8.7–10.2)
Chloride: 108 mmol/L — ABNORMAL HIGH (ref 96–106)
Creatinine, Ser: 0.7 mg/dL (ref 0.57–1.00)
GFR calc Af Amer: 121 mL/min/1.73
GFR calc non Af Amer: 105 mL/min/1.73
Globulin, Total: 3.1 g/dL (ref 1.5–4.5)
Glucose: 84 mg/dL (ref 65–99)
Potassium: 5.4 mmol/L — ABNORMAL HIGH (ref 3.5–5.2)
Sodium: 143 mmol/L (ref 134–144)
Total Protein: 7.7 g/dL (ref 6.0–8.5)

## 2019-11-26 LAB — CBC WITH DIFFERENTIAL/PLATELET
Basophils Absolute: 0 x10E3/uL (ref 0.0–0.2)
Basos: 1 %
EOS (ABSOLUTE): 0.1 x10E3/uL (ref 0.0–0.4)
Eos: 2 %
Hematocrit: 42.1 % (ref 34.0–46.6)
Hemoglobin: 13.9 g/dL (ref 11.1–15.9)
Immature Grans (Abs): 0 x10E3/uL (ref 0.0–0.1)
Immature Granulocytes: 0 %
Lymphocytes Absolute: 2.7 x10E3/uL (ref 0.7–3.1)
Lymphs: 50 %
MCH: 30.5 pg (ref 26.6–33.0)
MCHC: 33 g/dL (ref 31.5–35.7)
MCV: 93 fL (ref 79–97)
Monocytes Absolute: 0.4 x10E3/uL (ref 0.1–0.9)
Monocytes: 8 %
Neutrophils Absolute: 2.1 x10E3/uL (ref 1.4–7.0)
Neutrophils: 39 %
Platelets: 381 x10E3/uL (ref 150–450)
RBC: 4.55 x10E6/uL (ref 3.77–5.28)
RDW: 11.8 % (ref 11.7–15.4)
WBC: 5.3 x10E3/uL (ref 3.4–10.8)

## 2019-11-26 LAB — LIPID PANEL
Chol/HDL Ratio: 4.8 ratio — ABNORMAL HIGH (ref 0.0–4.4)
Cholesterol, Total: 178 mg/dL (ref 100–199)
HDL: 37 mg/dL — ABNORMAL LOW
LDL Chol Calc (NIH): 105 mg/dL — ABNORMAL HIGH (ref 0–99)
Triglycerides: 209 mg/dL — ABNORMAL HIGH (ref 0–149)
VLDL Cholesterol Cal: 36 mg/dL (ref 5–40)

## 2019-11-26 LAB — HEMOGLOBIN A1C
Est. average glucose Bld gHb Est-mCnc: 117 mg/dL
Hgb A1c MFr Bld: 5.7 % — ABNORMAL HIGH (ref 4.8–5.6)

## 2019-11-26 LAB — VITAMIN B12: Vitamin B-12: 586 pg/mL (ref 232–1245)

## 2019-11-27 LAB — H. PYLORI BREATH TEST: H pylori Breath Test: NEGATIVE

## 2019-12-02 ENCOUNTER — Ambulatory Visit: Payer: Self-pay

## 2019-12-02 ENCOUNTER — Other Ambulatory Visit: Payer: Self-pay | Admitting: Physician Assistant

## 2019-12-02 ENCOUNTER — Ambulatory Visit (INDEPENDENT_AMBULATORY_CARE_PROVIDER_SITE_OTHER): Payer: Medicare Other | Admitting: Orthopaedic Surgery

## 2019-12-02 ENCOUNTER — Encounter: Payer: Self-pay | Admitting: Orthopaedic Surgery

## 2019-12-02 ENCOUNTER — Telehealth: Payer: Self-pay | Admitting: Orthopaedic Surgery

## 2019-12-02 ENCOUNTER — Other Ambulatory Visit: Payer: Self-pay | Admitting: Family Medicine

## 2019-12-02 ENCOUNTER — Other Ambulatory Visit (INDEPENDENT_AMBULATORY_CARE_PROVIDER_SITE_OTHER): Payer: Self-pay | Admitting: Family Medicine

## 2019-12-02 VITALS — Ht 65.0 in | Wt 231.0 lb

## 2019-12-02 DIAGNOSIS — M79644 Pain in right finger(s): Secondary | ICD-10-CM | POA: Diagnosis not present

## 2019-12-02 DIAGNOSIS — M79645 Pain in left finger(s): Secondary | ICD-10-CM

## 2019-12-02 DIAGNOSIS — E1169 Type 2 diabetes mellitus with other specified complication: Secondary | ICD-10-CM

## 2019-12-02 DIAGNOSIS — E781 Pure hyperglyceridemia: Secondary | ICD-10-CM

## 2019-12-02 MED ORDER — TRAMADOL HCL 50 MG PO TABS: 50.0000 mg | ORAL_TABLET | Freq: Four times a day (QID) | ORAL | 1 refills | Status: DC | PRN

## 2019-12-02 MED ORDER — TRAMADOL HCL 50 MG PO TABS
50.0000 mg | ORAL_TABLET | Freq: Four times a day (QID) | ORAL | 0 refills | Status: DC | PRN
Start: 2019-12-02 — End: 2020-04-09

## 2019-12-02 NOTE — Addendum Note (Signed)
Addended by: Precious Bard on: 12/02/2019 10:11 AM   Modules accepted: Orders

## 2019-12-02 NOTE — Telephone Encounter (Signed)
Marisa [name] gave refill for tramadol today can you please write hard copy.

## 2019-12-02 NOTE — Telephone Encounter (Signed)
Patient called. Says the pharmacy does not have the RX for tramadol. She would like a call. [phone]

## 2019-12-02 NOTE — Progress Notes (Signed)
Office Visit Note   Patient: Marisa [name]           Date of Birth: [birth year]           MRN: [id number] Visit Date: 12/02/2019              Requested by: Antony Blackbird, MD [place],  [place] [id number] PCP: Antony Blackbird, MD   Assessment & Plan: Visit Diagnoses:  1. Pain of left thumb   2. Finger pain, right     Plan: Impression is left thumb disruption ulnar and radial collateral ligaments and right small finger sprain.  We will provide the patient with a removable thumb spica splint for the left hand and make a referral to Dr. Burney Gauze.  I have called in a prescription of tramadol to use in the meantime.  She will call us with any questions or concerns.  Follow-Up Instructions: Return for fu with Dr. Burney Gauze.   Orders:  Orders Placed This Encounter  Procedures  . XR Hand Complete Left  . XR Hand Complete Right   Meds ordered this encounter  Medications  . traMADol (ULTRAM) 50 MG tablet    Sig: Take 1 tablet (50 mg total) by mouth every 6 (six) hours as needed.    Dispense:  30 tablet    Refill:  1      Procedures: No procedures performed   Clinical Data: No additional findings.   Subjective: Chief Complaint  Patient presents with  . Left Hand - Pain  . Right Hand - Pain    HPI patient is a pleasant 44 year old right-hand-dominant female who comes in today with an injury to her left thumb and right small finger.  Approximately 1 month ago she fell landing on her left hand and trying to catch herself with the right.  She has had pain to the left thumb and right small finger since.  The pain she has the left thumb is to the MCP and IP joints.  Grabbing and gripping seems to cause sharp shooting pain.  She has tried ibuprofen which did cause GI upset.  She has not tried any splinting.  Review of Systems as detailed in HPI.  All others reviewed and are negative.   Objective: Vital Signs: Ht 5\' 5"  (1.651 m)   Wt 231 lb (104.8 kg)   LMP  11/27/2012   BMI 38.44 kg/m   Physical Exam well-developed well-nourished female no acute distress.  Alert and oriented x3.  Ortho Exam examination of her left thumb reveals moderate tenderness throughout.  Collateral testing of the thumb MCP joint elicits pain and gross instability.  She is neurovascularly intact distally.  Right small finger as moderate tenderness to the DIP joint.  She has somewhat limited range of motion due to pain with flexion.  Fingers are warm well perfused.  Specialty Comments:  No specialty comments available.  Imaging: XR Hand Complete Left  Result Date: 12/02/2019 No acute or structural abnormalities  XR Hand Complete Right  Result Date: 12/02/2019 No acute or structural abnormalities    PMFS History: Patient Active Problem List   Diagnosis Date Noted  . Insulin resistance 05/19/2019  . Peripheral edema 04/20/2019  . Vitamin D deficiency 04/05/2019  . Class 2 severe obesity with serious comorbidity and body mass index (BMI) of 36.0 to 36.9 in adult [facility]) 04/05/2019  . Obesity (BMI 30-39.9) 03/05/2018  . Panniculitis 03/05/2018  . RLQ abdominal pain   . Chronic diarrhea   .  Benign neoplasm of transverse colon   . Benign neoplasm of sigmoid colon   . Neuropathic pain of both legs 06/04/2016  . Carbuncle of labium 07/12/2015  . Rash and nonspecific skin eruption 07/12/2015  . Dandruff 03/26/2015  . Nipple discharge in female 03/26/2015  . Migraine variant with headache 05/09/2014  . Unable to ambulate 05/09/2014  . Severe recurrent major depressive disorder with psychotic features ([place]) 05/04/2014  . GAD (generalized anxiety disorder) 05/04/2014  . 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  . Social anxiety disorder 05/04/2014  . PTSD (post-traumatic stress disorder) 05/04/2014  . Cigarette nicotine dependence without complication 52/84/1324  . Gait disturbance 04/11/2014  . Depression 03/27/2014  . Falls 03/27/2014  . OSA (obstructive  sleep apnea) 03/06/2014  . Insomnia 03/06/2014  . Anxiety   . History of cardiac arrest   . Acute bronchitis   . Asthma exacerbation 02/20/2014  . Well controlled type 2 diabetes mellitus ([place]) 02/20/2014  . Sore throat 02/20/2014  . Chest pain 02/20/2014  . Tachycardia 02/20/2014  . Type 2 diabetes mellitus without complication, without long-term current use of insulin ([place]) 10/13/2013  . Pelvic pain 07/25/2013  . Rectal bleeding 07/25/2013  . Persistent vomiting 04/27/2013  . Atypical chest pain 04/26/2013  . Abdominal pain 04/26/2013  . S/P Total vaginal hysterectomy on 01/06/13 01/06/2013  . Dyspnea 09/07/2012  . 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  . Anemia 07/30/2012  . Current smoker 07/30/2012   Past Medical History:  Diagnosis Date  . Abdominal pain 04/26/2013  . Abnormal uterine bleeding (AUB) 10/11/2012  . Acute bronchitis   . Allergy   . Anal pain    chronic  . Anemia   . Anxiety   . Asthma    exacerbation 02-28-2014 and 02-23-2014 secondary to Rhinovirus  . Atypical chest pain 04/26/2013  . Benign neoplasm of sigmoid colon   . Benign neoplasm of transverse colon   . Carbuncle of labium 07/12/2015  . Chest pain 02/20/2014  . Chronic diarrhea   . Chronic headaches   . Chronic low back pain   . Cigarette nicotine dependence without complication 05/19/270  . Cyst of right ovary   . Dandruff 03/26/2015  . Depression 03/27/2014  . Diabetes mellitus without complication ([place]) 5/36/6440  . Difficult intravenous access    PER PT NEEDS PICC LINE  . Dyspnea 09/07/2012   [name] 08/2012:  No obstruction by FEV1%, but probable restriction.    . Falls 03/27/2014  . Food allergy    Mushrooms, shellfish  . GAD (generalized anxiety disorder) 05/04/2014  . Gait disturbance 04/11/2014  . Gait instability   . GERD (gastroesophageal reflux disease)   . History of adenomatous polyp of colon   . History of cardiac arrest    during SVD 1992  . History of ectopic pregnancy    2009-  S/P LEFT  SALPINGECTOMY  . History of panic attacks   . Hyperlipidemia   . IBS (irritable bowel syndrome)   . Insomnia 03/06/2014  . Joint pain   . Lower extremity edema   . Lumbar stenosis L4 -- L5 with bulging disk   w/ right leg weakness/ decreased mobility  . Migraine variant with headache 05/09/2014  . Mild obstructive sleep apnea    study 03-20-2014  no cpap recommended  . Neuromuscular disorder (HCC)    neuropathy in feet   . Neuropathic pain of both legs 06/04/2016  . Obesity (BMI 30-39.9) 03/05/2018  . OSA (obstructive sleep apnea) 03/06/2014  . Panic disorder with  agoraphobia 05/04/2014  . Panniculitis 03/05/2018  . Pelvic pain 07/25/2013  . Persistent vomiting 04/27/2013  . PTSD (post-traumatic stress disorder) 05/04/2014  . Rash and nonspecific skin eruption 07/12/2015  . Rectal bleeding 07/25/2013  . RLQ abdominal pain   . S/P Total vaginal hysterectomy on 01/06/13 01/06/2013  . Severe recurrent major depressive disorder with psychotic features ([place]) 05/04/2014  . Sleep apnea    mild no cpap  . Social anxiety disorder 05/04/2014  . Sore throat 02/20/2014  . Stomach ulcer   . Tachycardia 02/20/2014  . Type 2 diabetes mellitus ([place])   . Weakness of right leg    FROM BACK PROBLEM PER PT    Family History  Problem Relation Age of Onset  . Hypertension Mother   . Diabetes Mother   . Allergies Mother   . Heart disease Mother   . Clotting disorder Mother   . Stroke Mother   . Kidney disease Mother   . Thyroid disease Mother   . Cancer Father   . Hyperlipidemia Father   . Hypertension Father   . Colon cancer Father   . Liver disease Father   . Heart disease Maternal Grandmother   . Breast cancer Maternal Grandmother 8  . Schizophrenia Sister   . Bipolar disorder Sister   . Clotting disorder Sister   . Bipolar disorder Brother   . Kidney disease Brother   . Bipolar disorder Sister   . Pancreatic cancer Maternal Aunt   . Prostate cancer Maternal Uncle   . Liver cancer Maternal  Grandfather   . Rectal cancer Maternal Grandfather   . Liver cancer Paternal Grandfather   . Colon polyps Neg Hx   . Esophageal cancer Neg Hx   . Stomach cancer Neg Hx     Past Surgical History:  Procedure Laterality Date  . ABDOMINAL HYSTERECTOMY    . COLONOSCOPY Left 04/29/2013   Procedure: COLONOSCOPY;  Surgeon: Arta Silence, MD;  Location: [facility];  Service: Endoscopy;  Laterality: Left;  . COLONOSCOPY    . COLONOSCOPY WITH PROPOFOL N/A 08/01/2016   Procedure: COLONOSCOPY WITH PROPOFOL;  Surgeon: Doran Stabler, MD;  Location: [facility];  Service: Gastroenterology;  Laterality: N/A;  . ESOPHAGOGASTRODUODENOSCOPY (EGD) WITH PROPOFOL N/A 11/10/2018   Procedure: ESOPHAGOGASTRODUODENOSCOPY (EGD) WITH PROPOFOL;  Surgeon: Doran Stabler, MD;  Location: [facility];  Service: Gastroenterology;  Laterality: N/A;  . EVALUATION UNDER ANESTHESIA WITH FISTULECTOMY N/A 04/20/2014   Procedure: EXAM UNDER ANESTHESIA ;  Surgeon: Leighton Ruff, MD;  Location: [facility];  Service: General;  Laterality: N/A;  . FLEXIBLE SIGMOIDOSCOPY N/A 11/09/2013   Procedure: FLEXIBLE SIGMOIDOSCOPY;  Surgeon: Arta Silence, MD;  Location: [facility];  Service: Endoscopy;  Laterality: N/A;  . fupa removal     . LAPAROSCOPIC CHOLECYSTECTOMY  2005  . REFRACTIVE SURGERY    . SPHINCTEROTOMY N/A 04/20/2014   Procedure:  LATERAL INTERNAL SPHINCTEROTOMY;  Surgeon: Leighton Ruff, MD;  Location: [facility];  Service: General;  Laterality: N/A;  . TRANSTHORACIC ECHOCARDIOGRAM  12-30-2012   mild LVH/  ef 55-60%  . UNILATERAL SALPINGECTOMY  2009   laparotomy left salpingectomy-- ectopic preg.  [facility] UPPER GASTROINTESTINAL ENDOSCOPY    . VAGINAL HYSTERECTOMY N/A 01/06/2013   Procedure: HYSTERECTOMY VAGINAL;  Surgeon: Osborne Oman, MD;  Location: [facility];  Service: Gynecology;  Laterality: N/A;   Social History   Occupational History  . Occupation: disability  Tobacco Use  .  Smoking status: Former Smoker  Packs/day: 0.20    Years: 11.00    Pack years: 2.20    Types: Cigarettes    Quit date: 11/17/2013    Years since quitting: 6.0  . Smokeless tobacco: Never Used  . Tobacco comment: 2 years quit  Vaping Use  . Vaping Use: Never used  Substance and Sexual Activity  . Alcohol use: No    Alcohol/week: 0.0 standard drinks  . Drug use: No  . Sexual activity: Yes    Birth control/protection: Surgical

## 2019-12-06 ENCOUNTER — Other Ambulatory Visit: Payer: Self-pay | Admitting: Family Medicine

## 2019-12-06 DIAGNOSIS — E781 Pure hyperglyceridemia: Secondary | ICD-10-CM

## 2019-12-11 ENCOUNTER — Other Ambulatory Visit: Payer: Self-pay

## 2019-12-11 ENCOUNTER — Ambulatory Visit (HOSPITAL_COMMUNITY)
Admission: RE | Admit: 2019-12-11 | Discharge: 2019-12-11 | Disposition: A | Discharge status: Home / Self Care | Payer: Medicare Other | Source: Ambulatory Visit | Attending: Family Medicine | Admitting: Family Medicine

## 2019-12-11 DIAGNOSIS — R29898 Other symptoms and signs involving the musculoskeletal system: Secondary | ICD-10-CM | POA: Diagnosis present

## 2019-12-11 DIAGNOSIS — M4802 Spinal stenosis, cervical region: Secondary | ICD-10-CM | POA: Diagnosis present

## 2019-12-13 DIAGNOSIS — R030 Elevated blood-pressure reading, without diagnosis of hypertension: Secondary | ICD-10-CM | POA: Insufficient documentation

## 2019-12-13 DIAGNOSIS — E1140 Type 2 diabetes mellitus with diabetic neuropathy, unspecified: Secondary | ICD-10-CM | POA: Insufficient documentation

## 2019-12-13 DIAGNOSIS — M5021 Other cervical disc displacement,  high cervical region: Secondary | ICD-10-CM | POA: Insufficient documentation

## 2019-12-13 HISTORY — DX: Type 2 diabetes mellitus with diabetic neuropathy, unspecified: E11.40

## 2019-12-28 ENCOUNTER — Encounter (INDEPENDENT_AMBULATORY_CARE_PROVIDER_SITE_OTHER): Payer: Medicare Other | Admitting: Ophthalmology

## 2020-01-24 ENCOUNTER — Telehealth: Payer: Self-pay | Admitting: Family Medicine

## 2020-01-24 NOTE — Telephone Encounter (Signed)
Patient need her Bariatric paper filled out and fax to [phone]

## 2020-01-24 NOTE — Telephone Encounter (Signed)
Pt must make an appt for bariatric evaluation and new referral front ofc scheduling aware

## 2020-01-27 ENCOUNTER — Encounter: Payer: Self-pay | Admitting: Physician Assistant

## 2020-01-27 ENCOUNTER — Ambulatory Visit: Payer: Medicare Other | Attending: Physician Assistant | Admitting: Physician Assistant

## 2020-01-27 ENCOUNTER — Other Ambulatory Visit: Payer: Self-pay

## 2020-01-27 VITALS — BP 128/87 | HR 81 | Temp 98.7°F | Resp 18 | Ht 64.0 in | Wt 221.0 lb

## 2020-01-27 DIAGNOSIS — E119 Type 2 diabetes mellitus without complications: Secondary | ICD-10-CM

## 2020-01-27 DIAGNOSIS — Z029 Encounter for administrative examinations, unspecified: Secondary | ICD-10-CM | POA: Diagnosis not present

## 2020-01-27 DIAGNOSIS — R7303 Prediabetes: Secondary | ICD-10-CM

## 2020-01-27 DIAGNOSIS — F4001 Agoraphobia with panic disorder: Secondary | ICD-10-CM | POA: Diagnosis not present

## 2020-01-27 DIAGNOSIS — F4310 Post-traumatic stress disorder, unspecified: Secondary | ICD-10-CM

## 2020-01-27 DIAGNOSIS — F4010 Social phobia, unspecified: Secondary | ICD-10-CM

## 2020-01-27 DIAGNOSIS — F411 Generalized anxiety disorder: Secondary | ICD-10-CM

## 2020-01-27 DIAGNOSIS — E559 Vitamin D deficiency, unspecified: Secondary | ICD-10-CM | POA: Diagnosis not present

## 2020-01-27 MED ORDER — METFORMIN HCL 500 MG PO TABS: 500.0000 mg | ORAL_TABLET | Freq: Two times a day (BID) | ORAL | 5 refills | Status: DC

## 2020-01-27 NOTE — Progress Notes (Signed)
Established Patient Office Visit  Subjective:  Patient ID: Marisa [name], female    DOB: 03/25/1975  Age: 44 y.o. MRN: [id number]  CC:  Chief Complaint  Patient presents with   Obesity    HPI Marisa [name] requests paperwork be filled out on her behalf to begin authorization for bariatric surgery.  Reports that she has had several weight loss attempts without success.  Reports that in 2017 she attempted Rickard Patience without weight loss.  Last year she began working with a nutritionist, was placed on oral low calorie high protein diet without weight loss.  Reports that she participates in the exercise on an almost daily basis.    Past Medical History:  Diagnosis Date   Abdominal pain 04/26/2013   Abnormal uterine bleeding (AUB) 10/11/2012   Acute bronchitis    Allergy    Anal pain    chronic   Anemia    Anxiety    Asthma    exacerbation 02-28-2014 and 02-23-2014 secondary to Rhinovirus   Atypical chest pain 04/26/2013   Benign neoplasm of sigmoid colon    Benign neoplasm of transverse colon    Carbuncle of labium 07/12/2015   Chest pain 02/20/2014   Chronic diarrhea    Chronic headaches    Chronic low back pain    Cigarette nicotine dependence without complication 5/32/9924   Cyst of right ovary    Dandruff 03/26/2015   Depression 03/27/2014   Diabetes mellitus without complication ([place]) 2/68/3419   Difficult intravenous access    PER PT NEEDS PICC LINE   Dyspnea 09/07/2012   [name] 08/2012:  No obstruction by FEV1%, but probable restriction.     Falls 03/27/2014   Food allergy    Mushrooms, shellfish   GAD (generalized anxiety disorder) 05/04/2014   Gait disturbance 04/11/2014   Gait instability    GERD (gastroesophageal reflux disease)    History of adenomatous polyp of colon    History of cardiac arrest    during SVD 1992   History of ectopic pregnancy    2009-  S/P LEFT SALPINGECTOMY   History of panic attacks     Hyperlipidemia    IBS (irritable bowel syndrome)    Insomnia 03/06/2014   Joint pain    Lower extremity edema    Lumbar stenosis L4 -- L5 with bulging disk   w/ right leg weakness/ decreased mobility   Migraine variant with headache 05/09/2014   Mild obstructive sleep apnea    study 03-20-2014  no cpap recommended   Neuromuscular disorder (HCC)    neuropathy in feet    Neuropathic pain of both legs 06/04/2016   Obesity (BMI 30-39.9) 03/05/2018   OSA (obstructive sleep apnea) 03/06/2014   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   Panniculitis 03/05/2018   Pelvic pain 07/25/2013   Persistent vomiting 04/27/2013   PTSD (post-traumatic stress disorder) 05/04/2014   Rash and nonspecific skin eruption 07/12/2015   Rectal bleeding 07/25/2013   RLQ abdominal pain    S/P Total vaginal hysterectomy on 01/06/13 01/06/2013   Severe recurrent major depressive disorder with psychotic features ([place]) 05/04/2014   Sleep apnea    mild no cpap   Social anxiety disorder 05/04/2014   Sore throat 02/20/2014   Stomach ulcer    Tachycardia 02/20/2014   Type 2 diabetes mellitus (HCC)    Weakness of right leg    FROM BACK PROBLEM PER PT    Past Surgical History:  Procedure Laterality Date   ABDOMINAL HYSTERECTOMY  COLONOSCOPY Left 04/29/2013   Procedure: COLONOSCOPY;  Surgeon: Arta Silence, MD;  Location: [facility];  Service: Endoscopy;  Laterality: Left;   COLONOSCOPY     COLONOSCOPY WITH PROPOFOL N/A 08/01/2016   Procedure: COLONOSCOPY WITH PROPOFOL;  Surgeon: Doran Stabler, MD;  Location: [facility];  Service: Gastroenterology;  Laterality: N/A;   ESOPHAGOGASTRODUODENOSCOPY (EGD) WITH PROPOFOL N/A 11/10/2018   Procedure: ESOPHAGOGASTRODUODENOSCOPY (EGD) WITH PROPOFOL;  Surgeon: Doran Stabler, MD;  Location: [facility];  Service: Gastroenterology;  Laterality: N/A;   EVALUATION UNDER ANESTHESIA WITH FISTULECTOMY N/A 04/20/2014   Procedure: EXAM UNDER  ANESTHESIA ;  Surgeon: Leighton Ruff, MD;  Location[facility];  Service: General;  Laterality: N/A;   FLEXIBLE SIGMOIDOSCOPY N/A 11/09/2013   Procedure: FLEXIBLE SIGMOIDOSCOPY;  Surgeon: Arta Silence, MD;  Location: [facility];  Service: Endoscopy;  Laterality: N/A;   fupa removal      LAPAROSCOPIC CHOLECYSTECTOMY  2005   REFRACTIVE SURGERY     SPHINCTEROTOMY N/A 04/20/2014   Procedure:  LATERAL INTERNAL SPHINCTEROTOMY;  Surgeon: Leighton Ruff, MD;  Location: [facility];  Service: General;  Laterality: N/A;   TRANSTHORACIC ECHOCARDIOGRAM  12-30-2012   mild LVH/  ef 55-60%   UNILATERAL SALPINGECTOMY  2009   laparotomy left salpingectomy-- ectopic preg.   UPPER GASTROINTESTINAL ENDOSCOPY     VAGINAL HYSTERECTOMY N/A 01/06/2013   Procedure: HYSTERECTOMY VAGINAL;  Surgeon: Osborne Oman, MD;  Location: [facility];  Service: Gynecology;  Laterality: N/A;    Family History  Problem Relation Age of Onset   Hypertension Mother    Diabetes Mother    Allergies Mother    Heart disease Mother    Clotting disorder Mother    Stroke Mother    Kidney disease Mother    Thyroid disease Mother    Cancer Father    Hyperlipidemia Father    Hypertension Father    Colon cancer Father    Liver disease Father    Heart disease Maternal Grandmother    Breast cancer Maternal Grandmother 46   Schizophrenia Sister    Bipolar disorder Sister    Clotting disorder Sister    Bipolar disorder Brother    Kidney disease Brother    Bipolar disorder Sister    Pancreatic cancer Maternal Aunt    Prostate cancer Maternal Uncle    Liver cancer Maternal Grandfather    Rectal cancer Maternal Grandfather    Liver cancer Paternal Grandfather    Colon polyps Neg Hx    Esophageal cancer Neg Hx    Stomach cancer Neg Hx     Social History   Socioeconomic History   Marital status: Widowed    Spouse name: Not on file   Number of children: 1    Years of education: Not on file   Highest education level: Not on file  Occupational History   Occupation: disability  Tobacco Use   Smoking status: Former Smoker    Packs/day: 0.20    Years: 11.00    Pack years: 2.20    Types: Cigarettes    Quit date: 11/17/2013    Years since quitting: 6.2   Smokeless tobacco: Never Used   Tobacco comment: 2 years quit  Vaping Use   Vaping Use: Never used  Substance and Sexual Activity   Alcohol use: No    Alcohol/week: 0.0 standard drinks   Drug use: No   Sexual activity: Yes    Birth control/protection: Surgical  Other Topics Concern  Not on file  Social History Narrative   Lives with sister   Drinks no caffeine   Social Determinants of Health   Financial Resource Strain: Not on file  Food Insecurity: Not on file  Transportation Needs: Not on file  Physical Activity: Not on file  Stress: Not on file  Social Connections: Not on file  Intimate Partner Violence: Not on file    Outpatient Medications Prior to Visit  Medication Sig Dispense Refill   albuterol (VENTOLIN HFA) 108 (90 Base) MCG/ACT inhaler Inhale 2 puffs into the lungs every 6 (six) hours as needed for wheezing or shortness of breath. 18 g 11   ALPRAZolam (XANAX) 1 MG tablet Take one pill 15-30 minutes prior to procedures 6 tablet 0   dicyclomine (BENTYL) 10 MG capsule Take 1 capsule (10 mg total) by mouth 2 (two) times daily. 60 capsule 1   gabapentin (NEURONTIN) 300 MG capsule Take 2 capsules (600 mg) three times per day 540 capsule 1   ibuprofen (ADVIL) 600 MG tablet Take 1 tablet (600 mg total) by mouth every 8 (eight) hours as needed. 30 tablet 0   pantoprazole (PROTONIX) 40 MG tablet Take 1 tablet (40 mg total) by mouth daily. 30 tablet 3   promethazine (PHENERGAN) 25 MG tablet Take 25 mg by mouth every 6 (six) hours as needed for nausea or vomiting.     Semaglutide (RYBELSUS) 7 MG TABS Take 7 mg by mouth daily. 30 tablet 0   sucralfate  (CARAFATE) 1 g tablet Take 1 g by mouth 4 (four) times daily -  with meals and at bedtime.     traMADol (ULTRAM) 50 MG tablet Take 1 tablet (50 mg total) by mouth every 6 (six) hours as needed. 30 tablet 0   metFORMIN (GLUCOPHAGE) 500 MG tablet Take 1 tablet (500 mg total) by mouth 2 (two) times daily with a meal. 60 tablet 5   sertraline (ZOLOFT) 50 MG tablet Take 50 mg by mouth at bedtime.  (Patient not taking: Reported on 01/27/2020)     Vitamin D, Ergocalciferol, (DRISDOL) 1.25 MG (50000 UNIT) CAPS capsule Take 1 capsule (50,000 Units total) by mouth once a week. (Patient not taking: Reported on 01/27/2020) 4 capsule 0   No facility-administered medications prior to visit.    Allergies  Allergen Reactions   Asa [Aspirin] Anaphylaxis    Hives, chest tightness    Mushroom Extract Complex Anaphylaxis, Swelling and Other (See Comments)    Reaction:  Eye swelling   Penicillins Anaphylaxis and Other (See Comments)    Has patient had a PCN reaction causing immediate rash, facial/tongue/throat swelling, SOB or lightheadedness with hypotension: Yes Has patient had a PCN reaction causing severe rash involving mucus membranes or skin necrosis: No Has patient had a PCN reaction that required hospitalization No Has patient had a PCN reaction occurring within the last 10 years: No If all of the above answers are "NO", then may proceed with Cephalosporin use.   Shellfish Allergy Anaphylaxis   Triamcinolone Other (See Comments)    Skin issues     ROS Review of Systems  Constitutional: Negative.   HENT: Negative.   Eyes: Negative.   Respiratory: Negative.   Cardiovascular: Negative.   Gastrointestinal: Negative.   Endocrine: Negative.   Genitourinary: Negative.   Musculoskeletal: Negative.   Allergic/Immunologic: Negative.   Neurological: Negative.   Hematological: Negative.   Psychiatric/Behavioral: Negative for self-injury and suicidal ideas. The patient is nervous/anxious.  Objective:    Physical Exam Vitals and nursing note reviewed.   BP 128/87 (BP Location: Left Arm, Patient Position: Sitting, Cuff Size: Normal)    Pulse 81    Temp 98.7 F (37.1 C) (Oral)    Resp 18    Ht 5\' 4"  (1.626 m)    Wt 221 lb (100.2 kg)    LMP 11/27/2012    SpO2 97%    BMI 37.93 kg/m   General Appearance:    Alert, cooperative, no distress, appears stated age  Head:    Normocephalic, without obvious abnormality, atraumatic  Eyes:    PERRL, conjunctiva/corneas clear, EOM's intact, fundi    benign, both eyes  Ears:    Normal TM's and external ear canals, both ears  Nose:   Nares normal, septum midline, mucosa normal, no drainage    or sinus tenderness  Throat:   Lips, mucosa, and tongue normal; teeth and gums normal  Neck:   Supple, symmetrical, trachea midline, no adenopathy;    thyroid:  no enlargement/tenderness/nodules; no carotid   bruit or JVD  Back:     Symmetric, no curvature, ROM normal, no CVA tenderness  Lungs:     Clear to auscultation bilaterally, respirations unlabored  Chest Wall:    No tenderness or deformity   Heart:    Regular rate and rhythm, S1 and S2 normal, no murmur, rub   or gallop     Abdomen:     Soft, non-tender, bowel sounds active all four quadrants,    no masses, no organomegaly  Genitalia:    Normal female without lesion, discharge or tenderness        Pulses:   2+ and symmetric all extremities  Skin:   Skin color, texture, turgor normal, no rashes or lesions  Lymph nodes:   Cervical, supraclavicular, and axillary nodes normal  Neurologic:   CNII-XII intact, normal strength, sensation and reflexes    throughout    BP 128/87 (BP Location: Left Arm, Patient Position: Sitting, Cuff Size: Normal)    Pulse 81    Temp 98.7 F (37.1 C) (Oral)    Resp 18    Ht 5\' 4"  (1.626 m)    Wt 221 lb (100.2 kg)    LMP 11/27/2012    SpO2 97%    BMI 37.93 kg/m  Wt Readings from Last 3 Encounters:  01/27/20 221 lb (100.2 kg)  12/02/19 231 lb (104.8  kg)  11/25/19 231 lb 6.4 oz (105 kg)     Health Maintenance Due  Topic Date Due   PNEUMOCOCCAL POLYSACCHARIDE VACCINE AGE 79-64 HIGH RISK  Never done   TETANUS/TDAP  Never done   PAP SMEAR-Modifier  Never done   OPHTHALMOLOGY EXAM  04/30/2017   URINE MICROALBUMIN  03/02/2020    There are no preventive care reminders to display for this patient.  Lab Results  Component Value Date   TSH 0.740 03/03/2019   Lab Results  Component Value Date   WBC 5.3 11/25/2019   HGB 13.9 11/25/2019   HCT 42.1 11/25/2019   MCV 93 11/25/2019   PLT 381 11/25/2019   Lab Results  Component Value Date   NA 143 11/25/2019   K 5.4 (H) 11/25/2019   CO2 21 11/25/2019   GLUCOSE 84 11/25/2019   BUN 10 11/25/2019   CREATININE 0.70 11/25/2019   BILITOT <0.2 11/25/2019   ALKPHOS 67 11/25/2019   AST 15 11/25/2019   ALT 27 11/25/2019   PROT 7.7 11/25/2019   ALBUMIN  4.6 11/25/2019   CALCIUM 9.7 11/25/2019   ANIONGAP 16 (H) 12/18/2018   Lab Results  Component Value Date   CHOL 178 11/25/2019   Lab Results  Component Value Date   HDL 37 (L) 11/25/2019   Lab Results  Component Value Date   LDLCALC 105 (H) 11/25/2019   Lab Results  Component Value Date   TRIG 209 (H) 11/25/2019   Lab Results  Component Value Date   CHOLHDL 4.8 (H) 11/25/2019   Lab Results  Component Value Date   HGBA1C 5.7 (H) 11/25/2019      Assessment & Plan:   Problem List Items Addressed This Visit      Other   GAD (generalized anxiety disorder)   Relevant Orders   Ambulatory referral to Psychiatry   Panic disorder with agoraphobia   Social anxiety disorder   Relevant Orders   Ambulatory referral to Psychiatry   PTSD (post-traumatic stress disorder)   Relevant Orders   Ambulatory referral to Psychiatry   Vitamin D deficiency - Primary   Relevant Orders   Vitamin D, 25-hydroxy (Completed)    Other Visit Diagnoses    Prediabetes       Relevant Medications   metFORMIN (GLUCOPHAGE) 500 MG tablet       Meds ordered this encounter  Medications   metFORMIN (GLUCOPHAGE) 500 MG tablet    Sig: Take 1 tablet (500 mg total) by mouth 2 (two) times daily with a meal.    Dispense:  60 tablet    Refill:  5    Order Specific Question:   Supervising Provider    Answer:   Joya Gaskins, PATRICK E [[id number]]  1.  Diabetes type 2 without complication Refilled medication as courtesy, patient to follow-up with primary care provider for diabetes management - metFORMIN (GLUCOPHAGE) 500 MG tablet; Take 1 tablet (500 mg total) by mouth 2 (two) times daily with a meal.  Dispense: 60 tablet; Refill: 5  2. Vitamin D deficiency  - Vitamin D, 25-hydroxy  3. Panic disorder with agoraphobia   4. GAD (generalized anxiety disorder) Refer to psychiatry for medication management - Ambulatory referral to Psychiatry  5. PTSD (post-traumatic stress disorder)  - Ambulatory referral to Psychiatry  6. Social anxiety disorder  - Ambulatory referral to Psychiatry  Patient to return to clinic in 1 week for follow-up of diabetes and anxiety.  Paperwork completed on patient's behalf for bariatric surgery patient. given appointment to establish with new primary care provider   I have reviewed the patient's medical history (PMH, PSH, Social History, Family History, Medications, and allergies) , and have been updated if relevant. I spent 30 minutes reviewing chart and  face to face time with patient.     Follow-up: Return in about 1 week (around 02/03/2020).    Loraine Grip Mayers, PA-C

## 2020-01-27 NOTE — Patient Instructions (Signed)
I will work on your paperwork for you, please send me via MyChart a list of all the dates and diets that you have tried.  You told me Marisa [name] 2017, and have recently seen a nutritionist.  I need any other information to help verify your weight loss attempts.  If you have had any other weight loss trials such as keto, weight watchers, etc.  I have started a referral for you to be seen by psychiatry to help manage your anxiety and panic attacks.  Please return to the office next week for health maintenance follow-up  Kennieth Rad, PA-C Physician Assistant [place] [email or web address]    Generalized Anxiety Disorder, Adult Generalized anxiety disorder (GAD) is a mental health disorder. People with this condition constantly worry about everyday events. Unlike normal anxiety, worry related to GAD is not triggered by a specific event. These worries also do not fade or get better with time. GAD interferes with life functions, including relationships, work, and school. GAD can vary from mild to severe. People with severe GAD can have intense waves of anxiety with physical symptoms (panic attacks). What are the causes? The exact cause of GAD is not known. What increases the risk? This condition is more likely to develop in:  Women.  People who have a family history of anxiety disorders.  People who are very shy.  People who experience very stressful life events, such as the death of a loved one.  People who have a very stressful family environment. What are the signs or symptoms? People with GAD often worry excessively about many things in their lives, such as their health and family. They may also be overly concerned about:  Doing well at work.  Being on time.  Natural disasters.  Friendships. Physical symptoms of GAD include:  Fatigue.  Muscle tension or having muscle twitches.  Trembling or feeling shaky.  Being  easily startled.  Feeling like your heart is pounding or racing.  Feeling out of breath or like you cannot take a deep breath.  Having trouble falling asleep or staying asleep.  Sweating.  Nausea, diarrhea, or irritable bowel syndrome (IBS).  Headaches.  Trouble concentrating or remembering facts.  Restlessness.  Irritability. How is this diagnosed? Your health care provider can diagnose GAD based on your symptoms and medical history. You will also have a physical exam. The health care provider will ask specific questions about your symptoms, including how severe they are, when they started, and if they come and go. Your health care provider may ask you about your use of alcohol or drugs, including prescription medicines. Your health care provider may refer you to a mental health specialist for further evaluation. Your health care provider will do a thorough examination and may perform additional tests to rule out other possible causes of your symptoms. To be diagnosed with GAD, a person must have anxiety that:  Is out of his or her control.  Affects several different aspects of his or her life, such as work and relationships.  Causes distress that makes him or her unable to take part in normal activities.  Includes at least three physical symptoms of GAD, such as restlessness, fatigue, trouble concentrating, irritability, muscle tension, or sleep problems. Before your health care provider can confirm a diagnosis of GAD, these symptoms must be present more days than they are not, and they must last for six months or longer. How is this treated? The following therapies are usually used to treat  GAD:  Medicine. Antidepressant medicine is usually prescribed for long-term daily control. Antianxiety medicines may be added in severe cases, especially when panic attacks occur.  Talk therapy (psychotherapy). Certain types of talk therapy can be helpful in treating GAD by providing  support, education, and guidance. Options include: ? Cognitive behavioral therapy (CBT). People learn coping skills and techniques to ease their anxiety. They learn to identify unrealistic or negative thoughts and behaviors and to replace them with positive ones. ? Acceptance and commitment therapy (ACT). This treatment teaches people how to be mindful as a way to cope with unwanted thoughts and feelings. ? Biofeedback. This process trains you to manage your body's response (physiological response) through breathing techniques and relaxation methods. You will work with a therapist while machines are used to monitor your physical symptoms.  Stress management techniques. These include yoga, meditation, and exercise. A mental health specialist can help determine which treatment is best for you. Some people see improvement with one type of therapy. However, other people require a combination of therapies. Follow these instructions at home:  Take over-the-counter and prescription medicines only as told by your health care provider.  Try to maintain a normal routine.  Try to anticipate stressful situations and allow extra time to manage them.  Practice any stress management or self-calming techniques as taught by your health care provider.  Do not punish yourself for setbacks or for not making progress.  Try to recognize your accomplishments, even if they are small.  Keep all follow-up visits as told by your health care provider. This is important. Contact a health care provider if:  Your symptoms do not get better.  Your symptoms get worse.  You have signs of depression, such as: ? A persistently sad, cranky, or irritable mood. ? Loss of enjoyment in activities that used to bring you joy. ? Change in weight or eating. ? Changes in sleeping habits. ? Avoiding friends or family members. ? Loss of energy for normal tasks. ? Feelings of guilt or worthlessness. Get help right away  if:  You have serious thoughts about hurting yourself or others. If you ever feel like you may hurt yourself or others, or have thoughts about taking your own life, get help right away. You can go to your nearest emergency department or call:  Your local emergency services (911 in the [place]  A suicide crisis helpline, such as the [facility] at 360-558-0559. This is open 24 hours a day. Summary  Generalized anxiety disorder (GAD) is a mental health disorder that involves worry that is not triggered by a specific event.  People with GAD often worry excessively about many things in their lives, such as their health and family.  GAD may cause physical symptoms such as restlessness, trouble concentrating, sleep problems, frequent sweating, nausea, diarrhea, headaches, and trembling or muscle twitching.  A mental health specialist can help determine which treatment is best for you. Some people see improvement with one type of therapy. However, other people require a combination of therapies. This information is not intended to replace advice given to you by your health care provider. Make sure you discuss any questions you have with your health care provider. Document Revised: 01/16/2017 Document Reviewed: 12/25/2015 Elsevier Patient Education  2020 [employer].

## 2020-01-27 NOTE — Progress Notes (Signed)
Patient has not eaten today Patient has not taken medication today. Patient denies pain at this time. Patient requesting tramadol for migraines. Patient is requesting xanax on a daily basis patient shares Zoloft is not helping. Patient request refills on Metformin and Vitamin D.

## 2020-01-28 LAB — VITAMIN D 25 HYDROXY (VIT D DEFICIENCY, FRACTURES): Vit D, 25-Hydroxy: 13.1 ng/mL — ABNORMAL LOW (ref 30.0–100.0)

## 2020-01-30 ENCOUNTER — Other Ambulatory Visit: Payer: Self-pay | Admitting: Physician Assistant

## 2020-01-30 DIAGNOSIS — E559 Vitamin D deficiency, unspecified: Secondary | ICD-10-CM

## 2020-01-30 MED ORDER — VITAMIN D (ERGOCALCIFEROL) 1.25 MG (50000 UNIT) PO CAPS: 50000.0000 [IU] | ORAL_CAPSULE | ORAL | 2 refills | Status: DC

## 2020-02-01 ENCOUNTER — Ambulatory Visit: Payer: Medicare Other | Admitting: Physician Assistant

## 2020-02-02 ENCOUNTER — Emergency Department (HOSPITAL_COMMUNITY)
Admission: EM | Admit: 2020-02-02 | Discharge: 2020-02-02 | Disposition: A | Discharge status: Home / Self Care | Payer: Medicare Other | Attending: Emergency Medicine | Admitting: Emergency Medicine

## 2020-02-02 ENCOUNTER — Emergency Department (HOSPITAL_COMMUNITY): Payer: Medicare Other

## 2020-02-02 ENCOUNTER — Other Ambulatory Visit: Payer: Self-pay

## 2020-02-02 DIAGNOSIS — J4521 Mild intermittent asthma with (acute) exacerbation: Secondary | ICD-10-CM

## 2020-02-02 DIAGNOSIS — J45901 Unspecified asthma with (acute) exacerbation: Secondary | ICD-10-CM | POA: Insufficient documentation

## 2020-02-02 DIAGNOSIS — E119 Type 2 diabetes mellitus without complications: Secondary | ICD-10-CM | POA: Insufficient documentation

## 2020-02-02 DIAGNOSIS — Z20822 Contact with and (suspected) exposure to covid-19: Secondary | ICD-10-CM | POA: Insufficient documentation

## 2020-02-02 DIAGNOSIS — G40909 Epilepsy, unspecified, not intractable, without status epilepticus: Secondary | ICD-10-CM | POA: Diagnosis not present

## 2020-02-02 DIAGNOSIS — Z7984 Long term (current) use of oral hypoglycemic drugs: Secondary | ICD-10-CM | POA: Insufficient documentation

## 2020-02-02 DIAGNOSIS — R519 Headache, unspecified: Secondary | ICD-10-CM | POA: Diagnosis present

## 2020-02-02 DIAGNOSIS — Z87891 Personal history of nicotine dependence: Secondary | ICD-10-CM | POA: Diagnosis not present

## 2020-02-02 DIAGNOSIS — G43009 Migraine without aura, not intractable, without status migrainosus: Secondary | ICD-10-CM

## 2020-02-02 LAB — CBC WITH DIFFERENTIAL/PLATELET
Abs Immature Granulocytes: 0.04 10*3/uL (ref 0.00–0.07)
Basophils Absolute: 0 10*3/uL (ref 0.0–0.1)
Basophils Relative: 1 %
Eosinophils Absolute: 0.1 10*3/uL (ref 0.0–0.5)
Eosinophils Relative: 1 %
HCT: 41 % (ref 36.0–46.0)
Hemoglobin: 13.7 g/dL (ref 12.0–15.0)
Immature Granulocytes: 1 %
Lymphocytes Relative: 70 %
Lymphs Abs: 5.5 10*3/uL — ABNORMAL HIGH (ref 0.7–4.0)
MCH: 31.9 pg (ref 26.0–34.0)
MCHC: 33.4 g/dL (ref 30.0–36.0)
MCV: 95.3 fL (ref 80.0–100.0)
Monocytes Absolute: 0.5 10*3/uL (ref 0.1–1.0)
Monocytes Relative: 6 %
Neutro Abs: 1.6 10*3/uL — ABNORMAL LOW (ref 1.7–7.7)
Neutrophils Relative %: 21 %
Platelets: 361 10*3/uL (ref 150–400)
RBC: 4.3 MIL/uL (ref 3.87–5.11)
RDW: 12.5 % (ref 11.5–15.5)
WBC: 7.8 10*3/uL (ref 4.0–10.5)
nRBC: 0 % (ref 0.0–0.2)

## 2020-02-02 LAB — URINALYSIS, ROUTINE W REFLEX MICROSCOPIC
Bilirubin Urine: NEGATIVE
Glucose, UA: NEGATIVE mg/dL
Hgb urine dipstick: NEGATIVE
Ketones, ur: NEGATIVE mg/dL
Leukocytes,Ua: NEGATIVE
Nitrite: NEGATIVE
Protein, ur: NEGATIVE mg/dL
Specific Gravity, Urine: 1.03 (ref 1.005–1.030)
pH: 5 (ref 5.0–8.0)

## 2020-02-02 LAB — BASIC METABOLIC PANEL
Anion gap: 13 (ref 5–15)
BUN: 15 mg/dL (ref 6–20)
CO2: 22 mmol/L (ref 22–32)
Calcium: 8.3 mg/dL — ABNORMAL LOW (ref 8.9–10.3)
Chloride: 104 mmol/L (ref 98–111)
Creatinine, Ser: 0.61 mg/dL (ref 0.44–1.00)
GFR, Estimated: >60 mL/min (ref >60)
Glucose, Bld: 131 mg/dL — ABNORMAL HIGH (ref 70–99)
Potassium: 3.4 mmol/L — ABNORMAL LOW (ref 3.5–5.1)
Sodium: 139 mmol/L (ref 135–145)

## 2020-02-02 LAB — I-STAT BETA HCG BLOOD, ED (MC, WL, AP ONLY): I-stat hCG, quantitative: <5 m[IU]/mL (ref <5)

## 2020-02-02 LAB — RESP PANEL BY RT-PCR (FLU A&B, COVID) ARPGX2
Influenza A by PCR: NEGATIVE
Influenza B by PCR: NEGATIVE
SARS Coronavirus 2 by RT PCR: NEGATIVE

## 2020-02-02 MED ORDER — SODIUM CHLORIDE 0.9 % IV BOLUS (SEPSIS)
1000.0000 mL | Freq: Once | INTRAVENOUS | Status: AC
  Administered 2020-02-02: 1000 mL via INTRAVENOUS

## 2020-02-02 MED ORDER — METHYLPREDNISOLONE SODIUM SUCC 125 MG IJ SOLR
125.0000 mg | Freq: Once | INTRAMUSCULAR | Status: AC
  Administered 2020-02-02: 125 mg via INTRAVENOUS
  Filled 2020-02-02: qty 2

## 2020-02-02 MED ORDER — DIPHENHYDRAMINE HCL 50 MG/ML IJ SOLN
25.0000 mg | Freq: Once | INTRAMUSCULAR | Status: AC
  Administered 2020-02-02: 25 mg via INTRAVENOUS
  Filled 2020-02-02: qty 1

## 2020-02-02 MED ORDER — KETOROLAC TROMETHAMINE 30 MG/ML IJ SOLN
30.0000 mg | Freq: Once | INTRAMUSCULAR | Status: AC
  Administered 2020-02-02: 30 mg via INTRAVENOUS
  Filled 2020-02-02: qty 1

## 2020-02-02 MED ORDER — PREDNISONE 20 MG PO TABS: 40.0000 mg | ORAL_TABLET | Freq: Every day | ORAL | 0 refills | Status: DC

## 2020-02-02 MED ORDER — IPRATROPIUM-ALBUTEROL 0.5-2.5 (3) MG/3ML IN SOLN
3.0000 mL | Freq: Once | RESPIRATORY_TRACT | Status: AC
  Administered 2020-02-02: 3 mL via RESPIRATORY_TRACT
  Filled 2020-02-02: qty 3

## 2020-02-02 MED ORDER — METOCLOPRAMIDE HCL 5 MG/ML IJ SOLN
10.0000 mg | Freq: Once | INTRAMUSCULAR | Status: AC
  Administered 2020-02-02: 10 mg via INTRAVENOUS
  Filled 2020-02-02: qty 2

## 2020-02-02 MED ORDER — PROCHLORPERAZINE EDISYLATE 10 MG/2ML IJ SOLN
10.0000 mg | Freq: Once | INTRAMUSCULAR | Status: AC
  Administered 2020-02-02: 10 mg via INTRAVENOUS
  Filled 2020-02-02: qty 2

## 2020-02-02 MED ORDER — ALBUTEROL SULFATE HFA 108 (90 BASE) MCG/ACT IN AERS: 2.0000 | INHALATION_SPRAY | RESPIRATORY_TRACT | 0 refills | Status: DC | PRN

## 2020-02-02 NOTE — ED Triage Notes (Signed)
Patient came in for SOB and migraine. A&Ox4 . Yesterday temp. 104.31F. EMS gave albuterol/ duo neb.   Patient states she had her covid vaccine.   140/90-90-24resp-99% on breathing treatment  CBG: 179

## 2020-02-02 NOTE — Discharge Instructions (Signed)
We are discharging with a prescription of steroids to take for your asthma.  This can elevate your blood sugar.  Please watch your blood sugars closely at home.  Your labs, chest x-ray, urine are unremarkable.  Your Covid and flu swabs today were negative.

## 2020-02-02 NOTE — ED Provider Notes (Signed)
TIME SEEN: 2:42 AM  CHIEF COMPLAINT: Migraine headache, shortness of breath  HPI: Patient is a 44 year old female with history of asthma, migraine headaches who presents to the emergency department with complaints of migraine headache that started today.  Has photophobia.  Requesting a migraine cocktail.  Reports having numbness and tingling in both legs.  No weakness.  No other numbness.  Reports nausea and vomiting with her headache.  Also states she is having shortness of breath and wheezing.  Received albuterol and DuoNeb with EMS with some relief.  Has chronic cough with her asthma.  No change in this.  Also reports having a fever yesterday of 104.  States that this happened because she was "frustrated".  No sick contacts.  Has been vaccinated for COVID-19.  ROS: See HPI Constitutional:  fever  Eyes: no drainage  ENT: no runny nose   Cardiovascular:  no chest pain  Resp:  SOB  GI:  Vomiting; no diarrhea GU: no dysuria Integumentary: no rash  Allergy: no hives  Musculoskeletal: no leg swelling  Neurological: no slurred speech ROS otherwise negative  PAST MEDICAL HISTORY/PAST SURGICAL HISTORY:  Past Medical History:  Diagnosis Date  . Abdominal pain 04/26/2013  . Abnormal uterine bleeding (AUB) 10/11/2012  . Acute bronchitis   . Allergy   . Anal pain    chronic  . Anemia   . Anxiety   . Asthma    exacerbation 02-28-2014 and 02-23-2014 secondary to Rhinovirus  . Atypical chest pain 04/26/2013  . Benign neoplasm of sigmoid colon   . Benign neoplasm of transverse colon   . Carbuncle of labium 07/12/2015  . Chest pain 02/20/2014  . Chronic diarrhea   . Chronic headaches   . Chronic low back pain   . Cigarette nicotine dependence without complication 03/13/5807  . Cyst of right ovary   . Dandruff 03/26/2015  . Depression 03/27/2014  . Diabetes mellitus without complication ([place]) 9/83/3825  . Difficult intravenous access    PER PT NEEDS PICC LINE  . Dyspnea 09/07/2012   [name]  08/2012:  No obstruction by FEV1%, but probable restriction.    . Falls 03/27/2014  . Food allergy    Mushrooms, shellfish  . GAD (generalized anxiety disorder) 05/04/2014  . Gait disturbance 04/11/2014  . Gait instability   . GERD (gastroesophageal reflux disease)   . History of adenomatous polyp of colon   . History of cardiac arrest    during SVD 1992  . History of ectopic pregnancy    2009-  S/P LEFT SALPINGECTOMY  . History of panic attacks   . Hyperlipidemia   . IBS (irritable bowel syndrome)   . Insomnia 03/06/2014  . Joint pain   . Lower extremity edema   . Lumbar stenosis L4 -- L5 with bulging disk   w/ right leg weakness/ decreased mobility  . Migraine variant with headache 05/09/2014  . Mild obstructive sleep apnea    study 03-20-2014  no cpap recommended  . Neuromuscular disorder (HCC)    neuropathy in feet   . Neuropathic pain of both legs 06/04/2016  . Obesity (BMI 30-39.9) 03/05/2018  . OSA (obstructive sleep apnea) 03/06/2014  . 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  . Panniculitis 03/05/2018  . Pelvic pain 07/25/2013  . Persistent vomiting 04/27/2013  . PTSD (post-traumatic stress disorder) 05/04/2014  . Rash and nonspecific skin eruption 07/12/2015  . Rectal bleeding 07/25/2013  . RLQ abdominal pain   . S/P Total vaginal hysterectomy on 01/06/13 01/06/2013  .  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 Sleep apnea    mild no cpap  . Social anxiety disorder 05/04/2014  . Sore throat 02/20/2014  . Stomach ulcer   . Tachycardia 02/20/2014  . Type 2 diabetes mellitus ([place])   . Weakness of right leg    FROM BACK PROBLEM PER PT    MEDICATIONS:  Prior to Admission medications   Medication Sig Start Date End Date Taking? Authorizing Provider  albuterol (VENTOLIN HFA) 108 (90 Base) MCG/ACT inhaler Inhale 2 puffs into the lungs every 6 (six) hours as needed for wheezing or shortness of breath. 11/12/18   Fulp, Cammie, MD  ALPRAZolam  Duanne Moron) 1 MG tablet Take one pill 15-30 minutes prior to procedures 11/25/19   Fulp, Cammie, MD  dicyclomine (BENTYL) 10 MG capsule Take 1 capsule (10 mg total) by mouth 2 (two) times daily. 07/14/19   Doran Stabler, MD  gabapentin (NEURONTIN) 300 MG capsule Take 2 capsules (600 mg) three times per day 11/25/19   Fulp, Cammie, MD  ibuprofen (ADVIL) 600 MG tablet Take 1 tablet (600 mg total) by mouth every 8 (eight) hours as needed. 02/03/19   Wallene Huh, DPM  metFORMIN (GLUCOPHAGE) 500 MG tablet Take 1 tablet (500 mg total) by mouth 2 (two) times daily with a meal. 01/27/20   Mayers, Cari S, PA-C  pantoprazole (PROTONIX) 40 MG tablet Take 1 tablet (40 mg total) by mouth daily. 08/31/19   Mayers, Cari S, PA-C  promethazine (PHENERGAN) 25 MG tablet Take 25 mg by mouth every 6 (six) hours as needed for nausea or vomiting.    [provider]  Semaglutide (RYBELSUS) 7 MG TABS Take 7 mg by mouth daily. 06/07/19   Whitmire, Joneen Boers, FNP  sucralfate (CARAFATE) 1 g tablet Take 1 g by mouth 4 (four) times daily -  with meals and at bedtime.    [provider]  traMADol (ULTRAM) 50 MG tablet Take 1 tablet (50 mg total) by mouth every 6 (six) hours as needed. 12/02/19   Persons, Bevely Palmer, PA  Vitamin D, Ergocalciferol, (DRISDOL) 1.25 MG (50000 UNIT) CAPS capsule Take 1 capsule (50,000 Units total) by mouth once a week. 01/30/20   Mayers, Cari S, PA-C    ALLERGIES:  Allergies  Allergen Reactions  . Asa [Aspirin] Anaphylaxis    Hives, chest tightness   . Mushroom Extract Complex Anaphylaxis, Swelling and Other (See Comments)    Reaction:  Eye swelling  . Penicillins Anaphylaxis and Other (See Comments)    Has patient had a PCN reaction causing immediate rash, facial/tongue/throat swelling, SOB or lightheadedness with hypotension: Yes Has patient had a PCN reaction causing severe rash involving mucus membranes or skin necrosis: No Has patient had a PCN reaction that required  hospitalization No Has patient had a PCN reaction occurring within the last 10 years: No If all of the above answers are "NO", then may proceed with Cephalosporin use.  . Shellfish Allergy Anaphylaxis  . Triamcinolone Other (See Comments)    Skin issues     SOCIAL HISTORY:  Social History   Tobacco Use  . Smoking status: Former Smoker    Packs/day: 0.20    Years: 11.00    Pack years: 2.20    Types: Cigarettes    Quit date: 11/17/2013    Years since quitting: 6.2  . Smokeless tobacco: Never Used  . Tobacco comment: 2 years quit  Substance Use Topics  . Alcohol  use: No    Alcohol/week: 0.0 standard drinks    FAMILY HISTORY: Family History  Problem Relation Age of Onset  . Hypertension Mother   . Diabetes Mother   . Allergies Mother   . Heart disease Mother   . Clotting disorder Mother   . Stroke Mother   . Kidney disease Mother   . Thyroid disease Mother   . Cancer Father   . Hyperlipidemia Father   . Hypertension Father   . Colon cancer Father   . Liver disease Father   . Heart disease Maternal Grandmother   . Breast cancer Maternal Grandmother 25  . Schizophrenia Sister   . Bipolar disorder Sister   . Clotting disorder Sister   . Bipolar disorder Brother   . Kidney disease Brother   . Bipolar disorder Sister   . Pancreatic cancer Maternal Aunt   . Prostate cancer Maternal Uncle   . Liver cancer Maternal Grandfather   . Rectal cancer Maternal Grandfather   . Liver cancer Paternal Grandfather   . Colon polyps Neg Hx   . Esophageal cancer Neg Hx   . Stomach cancer Neg Hx     EXAM: BP 128/84 (BP Location: Right Arm)   Pulse (!) 117   Temp 97.7 F (36.5 C) (Oral)   Resp 18   LMP 11/27/2012   SpO2 95%  CONSTITUTIONAL: Alert and oriented and responds appropriately to questions.  Appears uncomfortable. HEAD: Normocephalic EYES: Conjunctivae clear, pupils appear equal, EOM appear intact; + photophobia ENT: normal nose; moist mucous membranes NECK: Supple,  normal ROM CARD: Regular and tachycardic; S1 and S2 appreciated; no murmurs, no clicks, no rubs, no gallops RESP: Normal chest excursion without splinting or tachypnea; breath sounds diminished at bases bilaterally, no rhonchi or rales, mild expiratory wheezes appreciated, no respiratory distress, no hypoxia ABD/GI: Normal bowel sounds; non-distended; soft, non-tender, no rebound, no guarding, no peritoneal signs, no hepatosplenomegaly BACK:  The back appears normal EXT: Normal ROM in all joints; no deformity noted, no edema; no cyanosis SKIN: Normal color for age and race; warm; no rash on exposed skin NEURO: Moves all extremities equally, normal sensation diffusely, cranial nerves II through XII intact, normal speech PSYCH: The patient's mood and manner are appropriate.   MEDICAL DECISION MAKING: Patient here with migraine headache.  Has history of the same and states this feels similar.  No focal neurologic deficits other than feeling tingling in both legs but normal neuro exam here.  Will treat with migraine cocktail -Toradol, Reglan, Benadryl, IV fluids.  Doubt intracranial hemorrhage, stroke, meningitis, CVT.  Patient also complaining of an asthma exacerbation.  Will give DuoNeb, Solu-Medrol and reassess.  Complains of fever of 104 yesterday.  Afebrile here.  Will obtain labs, urine, chest x-ray and Covid swab.  Nontoxic-appearing currently.  ED PROGRESS: 5:15 AM  Pt's labs, urine, chest x-ray unremarkable.  Covid swab pending.  Reports breathing has improved.  Still complaining of headache but has improved.  Will give IV Compazine.  6:55 AM  Pt's headache completely resolved.  Will discharge home.  Patient comfortable with plan.  Will discharge with albuterol inhaler and prednisone burst.  At this time, I do not feel there is any life-threatening condition present. I have reviewed, interpreted and discussed all results (EKG, imaging, lab, urine as appropriate) and exam findings with  patient/family. I have reviewed nursing notes and appropriate previous records.  I feel the patient is safe to be discharged home without further emergent workup and can continue  workup as an outpatient as needed. Discussed usual and customary return precautions. Patient/family verbalize understanding and are comfortable with this plan.  Outpatient follow-up has been provided as needed. All questions have been answered.   EKG Interpretation  Date/Time:  Thursday February 02 2020 03:42:58 EST Ventricular Rate:  109 PR Interval:    QRS Duration: 74 QT Interval:  341 QTC Calculation: 460 R Axis:   -21 Text Interpretation: Sinus tachycardia Inferior infarct, old Anterior infarct, old No significant change since last tracing Confirmed by Pearly Apachito, Cyril Mourning [phone]) on 02/02/2020 4:26:15 AM          Marisa [name] was evaluated in Emergency Department on 02/02/2020 for the symptoms described in the history of present illness. She was evaluated in the context of the global COVID-19 pandemic, which necessitated consideration that the patient might be at risk for infection with the SARS-CoV-2 virus that causes COVID-19. Institutional protocols and algorithms that pertain to the evaluation of patients at risk for COVID-19 are in a state of rapid change based on information released by regulatory bodies including the CDC and federal and state organizations. These policies and algorithms were followed during the patient's care in the ED.      Marisa [name], Delice Bison, DO 02/02/20 [phone]

## 2020-02-02 NOTE — ED Notes (Signed)
Patient states she is trying to call son to pick her up

## 2020-02-08 ENCOUNTER — Other Ambulatory Visit (HOSPITAL_COMMUNITY): Payer: Self-pay | Admitting: Surgery

## 2020-02-08 ENCOUNTER — Other Ambulatory Visit: Payer: Self-pay | Admitting: Surgery

## 2020-02-14 ENCOUNTER — Telehealth: Payer: Self-pay | Admitting: *Deleted

## 2020-02-14 NOTE — Telephone Encounter (Signed)
Patient verified DOB Patient is aware of vitamin still remaining low and to begin 50,000 units once a week.  Patient is also aware paperwork being completed and ready for pick up once she is feeling better

## 2020-02-14 NOTE — Telephone Encounter (Signed)
-----   Message from Roney Jaffe, [place] sent at 01/30/2020  1:23 PM EST ----- Please call patient and let her know that her vitamin D remains very low, she needs to continue to take vitamin D 50,000 units once a week for at least the next 12 weeks.  She should have it rechecked after 12 weeks.  I sent a refill to her pharmacy.

## 2020-02-20 ENCOUNTER — Ambulatory Visit (HOSPITAL_COMMUNITY): Admission: RE | Admit: 2020-02-20 | Payer: Medicare Other | Source: Ambulatory Visit

## 2020-02-20 ENCOUNTER — Ambulatory Visit (HOSPITAL_COMMUNITY): Payer: Medicare Other | Attending: Surgery

## 2020-02-20 ENCOUNTER — Encounter (HOSPITAL_COMMUNITY): Payer: Self-pay

## 2020-02-27 ENCOUNTER — Ambulatory Visit: Payer: Medicare Other | Admitting: Family Medicine

## 2020-03-02 ENCOUNTER — Encounter (HOSPITAL_COMMUNITY): Payer: Self-pay

## 2020-03-02 ENCOUNTER — Emergency Department (HOSPITAL_COMMUNITY): Payer: Medicare Other

## 2020-03-02 ENCOUNTER — Other Ambulatory Visit: Payer: Self-pay

## 2020-03-02 ENCOUNTER — Emergency Department (HOSPITAL_COMMUNITY)
Admission: EM | Admit: 2020-03-02 | Discharge: 2020-03-02 | Disposition: A | Discharge status: Home / Self Care | Payer: Medicare Other | Attending: Emergency Medicine | Admitting: Emergency Medicine

## 2020-03-02 DIAGNOSIS — Z85038 Personal history of other malignant neoplasm of large intestine: Secondary | ICD-10-CM | POA: Insufficient documentation

## 2020-03-02 DIAGNOSIS — R112 Nausea with vomiting, unspecified: Secondary | ICD-10-CM

## 2020-03-02 DIAGNOSIS — U071 COVID-19: Secondary | ICD-10-CM | POA: Insufficient documentation

## 2020-03-02 DIAGNOSIS — Z87891 Personal history of nicotine dependence: Secondary | ICD-10-CM | POA: Insufficient documentation

## 2020-03-02 DIAGNOSIS — J45909 Unspecified asthma, uncomplicated: Secondary | ICD-10-CM | POA: Insufficient documentation

## 2020-03-02 DIAGNOSIS — R55 Syncope and collapse: Secondary | ICD-10-CM | POA: Insufficient documentation

## 2020-03-02 DIAGNOSIS — Z7984 Long term (current) use of oral hypoglycemic drugs: Secondary | ICD-10-CM | POA: Insufficient documentation

## 2020-03-02 DIAGNOSIS — R1084 Generalized abdominal pain: Secondary | ICD-10-CM

## 2020-03-02 DIAGNOSIS — R197 Diarrhea, unspecified: Secondary | ICD-10-CM

## 2020-03-02 DIAGNOSIS — E119 Type 2 diabetes mellitus without complications: Secondary | ICD-10-CM | POA: Diagnosis not present

## 2020-03-02 DIAGNOSIS — K760 Fatty (change of) liver, not elsewhere classified: Secondary | ICD-10-CM | POA: Diagnosis not present

## 2020-03-02 LAB — CBC
HCT: 43.4 % (ref 36.0–46.0)
Hemoglobin: 15.1 g/dL — ABNORMAL HIGH (ref 12.0–15.0)
MCH: 32.5 pg (ref 26.0–34.0)
MCHC: 34.8 g/dL (ref 30.0–36.0)
MCV: 93.3 fL (ref 80.0–100.0)
Platelets: 366 10*3/uL (ref 150–400)
RBC: 4.65 MIL/uL (ref 3.87–5.11)
RDW: 12.4 % (ref 11.5–15.5)
WBC: 7.7 10*3/uL (ref 4.0–10.5)
nRBC: 0 % (ref 0.0–0.2)

## 2020-03-02 LAB — COMPREHENSIVE METABOLIC PANEL
ALT: 37 U/L (ref 0–44)
AST: 39 U/L (ref 15–41)
Albumin: 3.7 g/dL (ref 3.5–5.0)
Alkaline Phosphatase: 56 U/L (ref 38–126)
Anion gap: 17 — ABNORMAL HIGH (ref 5–15)
BUN: 7 mg/dL (ref 6–20)
CO2: 19 mmol/L — ABNORMAL LOW (ref 22–32)
Calcium: 8.4 mg/dL — ABNORMAL LOW (ref 8.9–10.3)
Chloride: 101 mmol/L (ref 98–111)
Creatinine, Ser: 0.79 mg/dL (ref 0.44–1.00)
GFR, Estimated: >60 mL/min (ref >60)
Glucose, Bld: 95 mg/dL (ref 70–99)
Potassium: 3.4 mmol/L — ABNORMAL LOW (ref 3.5–5.1)
Sodium: 137 mmol/L (ref 135–145)
Total Bilirubin: 1.4 mg/dL — ABNORMAL HIGH (ref 0.3–1.2)
Total Protein: 7.3 g/dL (ref 6.5–8.1)

## 2020-03-02 LAB — D-DIMER, QUANTITATIVE: D-Dimer, Quant: <0.27 ug/mL-FEU (ref 0.00–0.50)

## 2020-03-02 LAB — POC OCCULT BLOOD, ED: Fecal Occult Bld: NEGATIVE

## 2020-03-02 LAB — LIPASE, BLOOD: Lipase: 25 U/L (ref 11–51)

## 2020-03-02 MED ORDER — ONDANSETRON HCL 4 MG/2ML IJ SOLN
4.0000 mg | Freq: Once | INTRAMUSCULAR | Status: AC
  Administered 2020-03-02: 4 mg via INTRAVENOUS
  Filled 2020-03-02: qty 2

## 2020-03-02 MED ORDER — IOHEXOL 300 MG/ML  SOLN
100.0000 mL | Freq: Once | INTRAMUSCULAR | Status: AC | PRN
  Administered 2020-03-02: 100 mL via INTRAVENOUS

## 2020-03-02 MED ORDER — ACETAMINOPHEN 325 MG PO TABS
650.0000 mg | ORAL_TABLET | Freq: Once | ORAL | Status: AC
  Administered 2020-03-02: 650 mg via ORAL
  Filled 2020-03-02: qty 2

## 2020-03-02 MED ORDER — ONDANSETRON 4 MG PO TBDP: 4.0000 mg | ORAL_TABLET | Freq: Three times a day (TID) | ORAL | 0 refills | Status: DC | PRN

## 2020-03-02 MED ORDER — MORPHINE SULFATE (PF) 4 MG/ML IV SOLN
4.0000 mg | Freq: Once | INTRAVENOUS | Status: AC
  Administered 2020-03-02: 4 mg via INTRAVENOUS
  Filled 2020-03-02: qty 1

## 2020-03-02 MED ORDER — SODIUM CHLORIDE 0.9 % IV BOLUS
1000.0000 mL | Freq: Once | INTRAVENOUS | Status: AC
  Administered 2020-03-02: 1000 mL via INTRAVENOUS

## 2020-03-02 NOTE — ED Notes (Signed)
Pt understands need for urine sample.  Pt went to bathroom and had bowel movement as well. Still need urine sample.

## 2020-03-02 NOTE — ED Notes (Signed)
Pt discharged via wheelchair. All questions and concerns addressed. No complaints at this time.  ° °

## 2020-03-02 NOTE — Discharge Instructions (Signed)
Home to rest and hydrate. Your CT scan today showed fatty liver disease. This can be followed by your doctor, general recommendations involve healthy diet, weight loss, limiting alcohol intake.  You can follow up with your PCP. Referral also given to the [facility] for symptom related follow up concerns if your doctor is unable to see you.   A prescription for Zofran was sent to your pharmacy, take as prescribed for nausea and vomiting.

## 2020-03-02 NOTE — ED Provider Notes (Signed)
[facility] EMERGENCY DEPARTMENT Provider Note   CSN: [id number] Arrival date & time: 03/02/20  1208     History Chief Complaint  Patient presents with  . Covid Positive  . Abdominal Pain  . Weakness  . Shortness of Breath    Marisa [name] is a 45 y.o. female.  45 year old female with complex history listed below brought in by EMS from home, states she tested positive for COVID 02/16/20, continues to feel unwell. Reports generalized weakness, ongoing vomiting, fever (102 yesterday), reports dark red stools x 4 days with a constant sharp pain in her left flank. States today she was in the shower, felt weak and dizzy and passed out, hitting the back of her head, reports posterior headache, not anticoagulated.   Marisa [name] was evaluated in Emergency Department on 03/02/2020 for the symptoms described in the history of present illness. She was evaluated in the context of the global COVID-19 pandemic, which necessitated consideration that the patient might be at risk for infection with the SARS-CoV-2 virus that causes COVID-19. Institutional protocols and algorithms that pertain to the evaluation of patients at risk for COVID-19 are in a state of rapid change based on information released by regulatory bodies including the CDC and federal and state organizations. These policies and algorithms were followed during the patient's care in the ED.         Past Medical History:  Diagnosis Date  . Abdominal pain 04/26/2013  . Abnormal uterine bleeding (AUB) 10/11/2012  . Acute bronchitis   . Allergy   . Anal pain    chronic  . Anemia   . Anxiety   . Asthma    exacerbation 02-28-2014 and 02-23-2014 secondary to Rhinovirus  . Atypical chest pain 04/26/2013  . Benign neoplasm of sigmoid colon   . Benign neoplasm of transverse colon   . Carbuncle of labium 07/12/2015  . Chest pain 02/20/2014  . Chronic diarrhea   . Chronic headaches   . Chronic low back  pain   . Cigarette nicotine dependence without complication [id number]  . Cyst of right ovary   . Dandruff 03/26/2015  . Depression 03/27/2014  . Diabetes mellitus without complication ([place]) 123XX123  . Difficult intravenous access    PER PT NEEDS PICC LINE  . Dyspnea 09/07/2012   [name] 08/2012:  No obstruction by FEV1%, but probable restriction.    . Falls 03/27/2014  . Food allergy    Mushrooms, shellfish  . GAD (generalized anxiety disorder) 05/04/2014  . Gait disturbance 04/11/2014  . Gait instability   . GERD (gastroesophageal reflux disease)   . History of adenomatous polyp of colon   . History of cardiac arrest    during SVD 1992  . History of ectopic pregnancy    2009-  S/P LEFT SALPINGECTOMY  . History of panic attacks   . Hyperlipidemia   . IBS (irritable bowel syndrome)   . Insomnia 03/06/2014  . Joint pain   . Lower extremity edema   . Lumbar stenosis L4 -- L5 with bulging disk   w/ right leg weakness/ decreased mobility  . Migraine variant with headache 05/09/2014  . Mild obstructive sleep apnea    study 03-20-2014  no cpap recommended  . Neuromuscular disorder (HCC)    neuropathy in feet   . Neuropathic pain of both legs 06/04/2016  . Obesity (BMI 30-39.9) 03/05/2018  . OSA (obstructive sleep apnea) 03/06/2014  . 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  . Panniculitis  03/05/2018  . Pelvic pain 07/25/2013  . Persistent vomiting 04/27/2013  . PTSD (post-traumatic stress disorder) 05/04/2014  . Rash and nonspecific skin eruption 07/12/2015  . Rectal bleeding 07/25/2013  . RLQ abdominal pain   . S/P Total vaginal hysterectomy on 01/06/13 01/06/2013  . Severe recurrent major depressive disorder with psychotic features ([place]) 05/04/2014  . Sleep apnea    mild no cpap  . Social anxiety disorder 05/04/2014  . Sore throat 02/20/2014  . Stomach ulcer   . Tachycardia 02/20/2014  . Type 2 diabetes mellitus ([place])   . Weakness of right leg    FROM BACK PROBLEM PER PT    Patient Active  Problem List   Diagnosis Date Noted  . Insulin resistance 05/19/2019  . Peripheral edema 04/20/2019  . Vitamin D deficiency 04/05/2019  . Class 2 severe obesity with serious comorbidity and body mass index (BMI) of 36.0 to 36.9 in adult [facility]) 04/05/2019  . Obesity (BMI 30-39.9) 03/05/2018  . Panniculitis 03/05/2018  . RLQ abdominal pain   . Chronic diarrhea   . Benign neoplasm of transverse colon   . Benign neoplasm of sigmoid colon   . Neuropathic pain of both legs 06/04/2016  . Carbuncle of labium 07/12/2015  . Rash and nonspecific skin eruption 07/12/2015  . Dandruff 03/26/2015  . Nipple discharge in female 03/26/2015  . Migraine variant with headache 05/09/2014  . Unable to ambulate 05/09/2014  . Severe recurrent major depressive disorder with psychotic features ([place]) 05/04/2014  . GAD (generalized anxiety disorder) 05/04/2014  . 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  . Social anxiety disorder 05/04/2014  . PTSD (post-traumatic stress disorder) 05/04/2014  . Cigarette nicotine dependence without complication [id number]  . Gait disturbance 04/11/2014  . Depression 03/27/2014  . Falls 03/27/2014  . OSA (obstructive sleep apnea) 03/06/2014  . Insomnia 03/06/2014  . Anxiety   . History of cardiac arrest   . Acute bronchitis   . Asthma exacerbation 02/20/2014  . Well controlled type 2 diabetes mellitus ([place]) 02/20/2014  . Sore throat 02/20/2014  . Chest pain 02/20/2014  . Tachycardia 02/20/2014  . Type 2 diabetes mellitus without complication, without long-term current use of insulin ([place]) 10/13/2013  . Pelvic pain 07/25/2013  . Rectal bleeding 07/25/2013  . Persistent vomiting 04/27/2013  . Atypical chest pain 04/26/2013  . Abdominal pain 04/26/2013  . S/P Total vaginal hysterectomy on 01/06/13 01/06/2013  . Dyspnea 09/07/2012  . 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  . Anemia 07/30/2012  . Current smoker 07/30/2012    Past Surgical History:  Procedure Laterality Date   . ABDOMINAL HYSTERECTOMY    . COLONOSCOPY Left 04/29/2013   Procedure: COLONOSCOPY;  Surgeon: Arta Silence, MD;  Location: [facility];  Service: Endoscopy;  Laterality: Left;  . COLONOSCOPY    . COLONOSCOPY WITH PROPOFOL N/A 08/01/2016   Procedure: COLONOSCOPY WITH PROPOFOL;  Surgeon: Doran Stabler, MD;  Location: [facility];  Service: Gastroenterology;  Laterality: N/A;  . ESOPHAGOGASTRODUODENOSCOPY (EGD) WITH PROPOFOL N/A 11/10/2018   Procedure: ESOPHAGOGASTRODUODENOSCOPY (EGD) WITH PROPOFOL;  Surgeon: Doran Stabler, MD;  Location: [facility];  Service: Gastroenterology;  Laterality: N/A;  . EVALUATION UNDER ANESTHESIA WITH FISTULECTOMY N/A 04/20/2014   Procedure: EXAM UNDER ANESTHESIA ;  Surgeon: Leighton Ruff, MD;  Location: [facility];  Service: General;  Laterality: N/A;  . FLEXIBLE SIGMOIDOSCOPY N/A 11/09/2013   Procedure: FLEXIBLE SIGMOIDOSCOPY;  Surgeon: Arta Silence, MD;  Location: [facility];  Service: Endoscopy;  Laterality: N/A;  . fupa removal     .  LAPAROSCOPIC CHOLECYSTECTOMY  2005  . REFRACTIVE SURGERY    . SPHINCTEROTOMY N/A 04/20/2014   Procedure:  LATERAL INTERNAL SPHINCTEROTOMY;  Surgeon: Leighton Ruff, MD;  Location: [facility];  Service: General;  Laterality: N/A;  . TRANSTHORACIC ECHOCARDIOGRAM  12-30-2012   mild LVH/  ef 55-60%  . UNILATERAL SALPINGECTOMY  2009   laparotomy left salpingectomy-- ectopic preg.  Marland Kitchen UPPER GASTROINTESTINAL ENDOSCOPY    . VAGINAL HYSTERECTOMY N/A 01/06/2013   Procedure: HYSTERECTOMY VAGINAL;  Surgeon: Osborne Oman, MD;  Location: [facility];  Service: Gynecology;  Laterality: N/A;     OB History    Gravida  3   Para  1   Term  1   Preterm      AB  2   Living  1     SAB      IAB  1   Ectopic  1   Multiple      Live Births              Family History  Problem Relation Age of Onset  . Hypertension Mother   . Diabetes Mother   . Allergies Mother   . Heart disease  Mother   . Clotting disorder Mother   . Stroke Mother   . Kidney disease Mother   . Thyroid disease Mother   . Cancer Father   . Hyperlipidemia Father   . Hypertension Father   . Colon cancer Father   . Liver disease Father   . Heart disease Maternal Grandmother   . Breast cancer Maternal Grandmother 6  . Schizophrenia Sister   . Bipolar disorder Sister   . Clotting disorder Sister   . Bipolar disorder Brother   . Kidney disease Brother   . Bipolar disorder Sister   . Pancreatic cancer Maternal Aunt   . Prostate cancer Maternal Uncle   . Liver cancer Maternal Grandfather   . Rectal cancer Maternal Grandfather   . Liver cancer Paternal Grandfather   . Colon polyps Neg Hx   . Esophageal cancer Neg Hx   . Stomach cancer Neg Hx     Social History   Tobacco Use  . Smoking status: Former Smoker    Packs/day: 0.20    Years: 11.00    Pack years: 2.20    Types: Cigarettes    Quit date: 11/17/2013    Years since quitting: 6.2  . Smokeless tobacco: Never Used  . Tobacco comment: 2 years quit  Vaping Use  . Vaping Use: Never used  Substance Use Topics  . Alcohol use: No    Alcohol/week: 0.0 standard drinks  . Drug use: No    Home Medications Prior to Admission medications   Medication Sig Start Date End Date Taking? Authorizing Provider  ondansetron (ZOFRAN ODT) 4 MG disintegrating tablet Take 1 tablet (4 mg total) by mouth every 8 (eight) hours as needed for nausea or vomiting. 03/02/20  Yes Tacy Learn, PA-C  albuterol (VENTOLIN HFA) 108 (90 Base) MCG/ACT inhaler Inhale 2 puffs into the lungs every 6 (six) hours as needed for wheezing or shortness of breath. 11/12/18   Fulp, Cammie, MD  albuterol (VENTOLIN HFA) 108 (90 Base) MCG/ACT inhaler Inhale 2 puffs into the lungs every 4 (four) hours as needed for wheezing or shortness of breath. 02/02/20   Ward, Delice Bison, DO  ALPRAZolam Duanne Moron) 1 MG tablet Take one pill 15-30 minutes prior to procedures 11/25/19   Fulp, Cammie,  MD  dicyclomine (BENTYL) 10  MG capsule Take 1 capsule (10 mg total) by mouth 2 (two) times daily. 07/14/19   Doran Stabler, MD  gabapentin (NEURONTIN) 300 MG capsule Take 2 capsules (600 mg) three times per day 11/25/19   Fulp, Cammie, MD  ibuprofen (ADVIL) 600 MG tablet Take 1 tablet (600 mg total) by mouth every 8 (eight) hours as needed. 02/03/19   Wallene Huh, DPM  metFORMIN (GLUCOPHAGE) 500 MG tablet Take 1 tablet (500 mg total) by mouth 2 (two) times daily with a meal. 01/27/20   Mayers, Cari S, PA-C  pantoprazole (PROTONIX) 40 MG tablet Take 1 tablet (40 mg total) by mouth daily. 08/31/19   Mayers, Cari S, PA-C  predniSONE (DELTASONE) 20 MG tablet Take 2 tablets (40 mg total) by mouth daily. 02/02/20   Ward, Delice Bison, DO  promethazine (PHENERGAN) 25 MG tablet Take 25 mg by mouth every 6 (six) hours as needed for nausea or vomiting.    [provider]  Semaglutide (RYBELSUS) 7 MG TABS Take 7 mg by mouth daily. 06/07/19   Whitmire, Joneen Boers, FNP  sucralfate (CARAFATE) 1 g tablet Take 1 g by mouth 4 (four) times daily -  with meals and at bedtime.    [provider]  traMADol (ULTRAM) 50 MG tablet Take 1 tablet (50 mg total) by mouth every 6 (six) hours as needed. 12/02/19   Persons, Bevely Palmer, PA  Vitamin D, Ergocalciferol, (DRISDOL) 1.25 MG (50000 UNIT) CAPS capsule Take 1 capsule (50,000 Units total) by mouth once a week. 01/30/20   Mayers, Cari S, PA-C    Allergies    Asa [aspirin], Mushroom extract complex, Penicillins, Shellfish allergy, and Triamcinolone  Review of Systems   Review of Systems  Constitutional: Positive for fatigue and fever.  Respiratory: Positive for cough and shortness of breath.   Cardiovascular: Negative for chest pain.  Gastrointestinal: Positive for abdominal pain, blood in stool, diarrhea, nausea and vomiting. Negative for constipation.  Genitourinary: Negative for dysuria and frequency.  Musculoskeletal: Positive for arthralgias and  myalgias.  Skin: Negative for rash and wound.  Neurological: Positive for dizziness, weakness, light-headedness and headaches.  Hematological: Does not bruise/bleed easily.  Psychiatric/Behavioral: Negative for confusion.  All other systems reviewed and are negative.   Physical Exam Updated Vital Signs BP (!) 131/93   Pulse 95   Temp 99.2 F (37.3 C) (Oral)   Resp (!) 21   Ht 5\' 5"  (1.651 m)   Wt 106.6 kg   LMP 11/27/2012   SpO2 95%   BMI 39.11 kg/m   Physical Exam Vitals and nursing note reviewed. Exam conducted with a chaperone present.  Constitutional:      General: She is not in acute distress.    Appearance: She is well-developed and well-nourished. She is obese. She is not diaphoretic.  HENT:     Head: Normocephalic and atraumatic.  Eyes:     Extraocular Movements: Extraocular movements intact.     Pupils: Pupils are equal, round, and reactive to light.  Cardiovascular:     Rate and Rhythm: Normal rate and regular rhythm.     Heart sounds: Normal heart sounds.  Pulmonary:     Effort: Pulmonary effort is normal.     Breath sounds: Normal breath sounds.  Abdominal:     Palpations: Abdomen is soft.     Tenderness: There is generalized abdominal tenderness.  Genitourinary:    Rectum: Guaiac result negative.  Musculoskeletal:     Cervical back: Normal range  of motion and neck supple. No tenderness.  Skin:    General: Skin is warm and dry.     Coloration: Skin is not pale.     Findings: No rash.  Neurological:     Mental Status: She is alert and oriented to person, place, and time.  Psychiatric:        Mood and Affect: Mood and affect normal.        Behavior: Behavior normal.     ED Results / Procedures / Treatments   Labs (all labs ordered are listed, but only abnormal results are displayed) Labs Reviewed  COMPREHENSIVE METABOLIC PANEL - Abnormal; Notable for the following components:      Result Value   Potassium 3.4 (*)    CO2 19 (*)    Calcium 8.4  (*)    Total Bilirubin 1.4 (*)    Anion gap 17 (*)    All other components within normal limits  CBC - Abnormal; Notable for the following components:   Hemoglobin 15.1 (*)    All other components within normal limits  LIPASE, BLOOD  D-DIMER, QUANTITATIVE (NOT AT [facility])  URINALYSIS, ROUTINE W REFLEX MICROSCOPIC  POC OCCULT BLOOD, ED    EKG None  Radiology CT Head Wo Contrast  Result Date: 03/02/2020 CLINICAL DATA:  Fall in tub today hitting back of head with loss of consciousness. EXAM: CT HEAD WITHOUT CONTRAST TECHNIQUE: Contiguous axial images were obtained from the base of the skull through the vertex without intravenous contrast. COMPARISON:  Brain MR January 02, 2018 and head CT January 02, 2018. FINDINGS: Brain: No evidence of acute infarction, hemorrhage, hydrocephalus, extra-axial collection or mass lesion/mass effect. Vascular: No hyperdense vessel or unexpected calcification. Skull: Normal. Negative for fracture or focal lesion. Sinuses/Orbits: Right maxillary mucous retention cyst. The other paranasal sinuses and mastoid air cells are clear. Other: None. IMPRESSION: No acute intracranial findings. Electronically Signed   By: Dahlia Bailiff MD   On: 03/02/2020 18:03   CT Abdomen Pelvis W Contrast  Result Date: 03/02/2020 CLINICAL DATA:  Acute nonlocalized abdominal pain. Dark red blood in stools X 4 days. Status post fall on the tub today. Positive loss of consciousness. EXAM: CT ABDOMEN AND PELVIS WITH CONTRAST TECHNIQUE: Multidetector CT imaging of the abdomen and pelvis was performed using the standard protocol following bolus administration of intravenous contrast. CONTRAST:  138mL OMNIPAQUE IOHEXOL 300 MG/ML  SOLN COMPARISON:  None. FINDINGS: Lower chest: No acute abnormality. Hepatobiliary: The liver is enlarged measuring up to at least 23.5 cm. The hepatic parenchyma is diffusely hypodense compared to the splenic parenchyma consistent with fatty infiltration. No focal liver  abnormality. Status post cholecystectomy. No biliary dilatation. Pancreas: No focal lesion. Normal pancreatic contour. No surrounding inflammatory changes. No main pancreatic ductal dilatation. Spleen: Normal in size without focal abnormality. Adrenals/Urinary Tract: No adrenal nodule bilaterally. Bilateral kidneys enhance symmetrically. No hydronephrosis. No hydroureter. The urinary bladder is decompressed. On delayed imaging, there is no urothelial wall thickening and there are no filling defects in the opacified portions of the bilateral collecting systems or ureters. Stomach/Bowel: Stomach is within normal limits. No evidence of bowel wall thickening or dilatation. Scattered colonic diverticulosis. Appendix appears normal. Vascular/Lymphatic: No abdominal aorta or iliac aneurysm. Mild atherosclerotic plaque of the aorta and its branches. No abdominal, pelvic, or inguinal lymphadenopathy. Reproductive: Status post hysterectomy. No adnexal masses. Other: No intraperitoneal free fluid. No intraperitoneal free gas. No organized fluid collection. Musculoskeletal: No abdominal wall hernia or abnormality No suspicious lytic  or blastic osseous lesions. No acute displaced fracture. IMPRESSION: 1. Scattered colonic diverticulosis with no findings suggest acute diverticulitis. 2. Hepatomegaly and hepatic steatosis. Electronically Signed   By: Iven Finn M.D.   On: 03/02/2020 18:14   DG Chest Portable 1 View  Result Date: 03/02/2020 CLINICAL DATA:  Shortness of breath, COVID-19 positive. EXAM: PORTABLE CHEST 1 VIEW COMPARISON:  None. FINDINGS: The heart size and mediastinal contours are within normal limits. Both lungs are clear. The visualized skeletal structures are unremarkable. IMPRESSION: No active disease. Electronically Signed   By: Marijo Conception M.D.   On: 03/02/2020 12:48    Procedures Procedures (including critical care time)  Medications Ordered in ED Medications  sodium chloride 0.9 % bolus  1,000 mL (0 mLs Intravenous Stopped 03/02/20 1748)  acetaminophen (TYLENOL) tablet 650 mg (650 mg Oral Given 03/02/20 1603)  ondansetron (ZOFRAN) injection 4 mg (4 mg Intravenous Given 03/02/20 1603)  iohexol (OMNIPAQUE) 300 MG/ML solution 100 mL (100 mLs Intravenous Contrast Given 03/02/20 1755)  morphine 4 MG/ML injection 4 mg (4 mg Intravenous Given 03/02/20 2005)    ED Course  I have reviewed the triage vital signs and the nursing notes.  Pertinent labs & imaging results that were available during my care of the patient were reviewed by me and considered in my medical decision making (see chart for details).  Clinical Course as of 03/02/20 2101  Fri Jan 14, 169  1024 45 year old female presents from home with multiple complaints as above.  On exam, patient does overall well-appearing, no acute distress, nontoxic.  Patient is found to have generalized abdominal pain, neuro exam is unremarkable.  Vital signs are stable. [LM]  1832 Labs are reassuring including CBC with stable hemoglobin compared to prior, CMP with normal LFTs, bicarb slightly low at 19 with anion gap of 17, patient was given IV fluids and Zofran.  Lipase within normal limits. CT abdomen pelvis with diverticulosis and hepatic steatosis, discussed same with patient.  CT head negative for acute injury related to her syncopal episode today.  EKG with sinus tachycardia with rate of 102.  Case discussed with Dr. Vallery Ridge, ER attending, plan is to add on D-dimer to further evaluate for possible PE due to history of COVID with complaint of flank pain and syncopal episode today. [LM]  2058 D-dimer negative, hemoccult negative. Patient is tolerating PO fluids. Plan is to dc with rx for Zofran.  Advised to go home and rest, take Zofran as needed as prescribed.  Return to ED for worsening or concerning symptoms otherwise follow-up with PCP.  Also given information on post-COVID clinic for follow-up if needed. [LM]    Clinical Course User  Index [LM] Roque Lias   MDM Rules/Calculators/A&P                          Final Clinical Impression(s) / ED Diagnoses Final diagnoses:  COVID-19  Generalized abdominal pain  Nausea vomiting and diarrhea  Syncope, unspecified syncope type  Hepatic steatosis    Rx / DC Orders ED Discharge Orders         Ordered    ondansetron (ZOFRAN ODT) 4 MG disintegrating tablet  Every 8 hours PRN        03/02/20 2030           Tacy Learn, PA-C 03/02/20 2101    Charlesetta Shanks, MD 03/04/20 [phone]

## 2020-03-02 NOTE — ED Triage Notes (Signed)
Pt coming from home with ems Pt has multiple complaints: Tested positive of 12/30, reports sob, weakness, no appetite, abd pain, dark red blood in her stools for 4 days. Pt also reports she fell in the tub today, hitting the back of her head with +LOC . VSS, pt a.o, resp e.u at this time.

## 2020-03-02 NOTE — ED Notes (Signed)
Pt walked self to bathroom did not provide UA sample.

## 2020-03-12 ENCOUNTER — Ambulatory Visit (HOSPITAL_COMMUNITY)
Admission: RE | Admit: 2020-03-12 | Discharge: 2020-03-12 | Disposition: A | Discharge status: Home / Self Care | Payer: Medicare Other | Source: Ambulatory Visit | Attending: Surgery | Admitting: Surgery

## 2020-03-12 ENCOUNTER — Other Ambulatory Visit: Payer: Self-pay

## 2020-03-13 ENCOUNTER — Telehealth: Payer: Self-pay

## 2020-03-13 NOTE — Telephone Encounter (Signed)
[facility] called pt verified DOB. Pt reports doing much better stating feels good & is looking forward to appt 04/09/20 with Dr. Asencion Noble. Encouraged pt to write down questions for preparation for appt. Pt verbalized understanding to contact our clinic before seeing Dr. Joya Gaskins if new or worsening symptoms.

## 2020-03-15 ENCOUNTER — Encounter: Payer: Medicare Other | Attending: Surgery | Admitting: Skilled Nursing Facility1

## 2020-03-15 ENCOUNTER — Encounter: Payer: Self-pay | Admitting: Skilled Nursing Facility1

## 2020-03-15 ENCOUNTER — Other Ambulatory Visit: Payer: Self-pay

## 2020-03-15 DIAGNOSIS — E669 Obesity, unspecified: Secondary | ICD-10-CM

## 2020-03-15 DIAGNOSIS — E119 Type 2 diabetes mellitus without complications: Secondary | ICD-10-CM | POA: Diagnosis present

## 2020-03-15 NOTE — Progress Notes (Addendum)
Nutrition Assessment for Bariatric Surgery Medical Nutrition Therapy Appt Start Time: 9:00  End Time:   Patient was seen on 03/15/2020 for Pre-Operative Nutrition Assessment. Letter of approval faxed to [facility] Surgery bariatric surgery program coordinator on 03/15/2020  Referral stated Supervised Weight Loss (SWL) visits needed: 6  Planned surgery: sleeve gastrectomy  Pt expectation of surgery: to lose weight Pt expectation of dietitian: to help    NUTRITION ASSESSMENT   Anthropometrics  Start weight at [facility]: 242.6 lbs (date: 03/15/2020)  Height: 64 in BMI: 41.61 kg/m2     Clinical  Medical hx: diabetes, fatty liver Medications: metformin, vitamin D, IBS-D Labs: A1C 5.7, K 3.4, CO2 19, calcium 8.4 Notable signs/symptoms: headaches, gait instability  Any previous deficiencies? Vitamin D  Micronutrient Nutrition Focused Physical Exam: Hair: No issues observed Eyes: No issues observed Mouth: No issues observed Neck: No issues observed Nails: No issues observed Skin: No issues observed  Lifestyle & Dietary Hx  Pt states she checks her blood sugars 2 times a day: fasting: 98, in the evening 110-115. Pt states she will get anxiety attacks when she first wakes up about to see her primary care. Pt states she is currently trying to find a psychiatrist for medication management. Pt states she does not eat white rice or white bread. Pt states her sister has diabetes so she does not buy anything with sugar.  Pt states she tries to go for walks in the neighborhood.  Pt states she does not eat past fullness.  Pt states she does not know the difference between appetite and hunger and would like to work on this.   24-Hr Dietary Recall First Meal: herbal tea + lemon or 1 apple Snack:  Second Meal: pre-made half sandwich  Snack: apple or watermelon Third Meal: baked chicken, broccoli, brown rice Snack:  Beverages: lemonade, sugar free natures twist, water with  fruit/vegetable   Estimated Energy Needs Calories: 1500   NUTRITION DIAGNOSIS  Overweight/obesity ([place]-3.3) related to past poor dietary habits and physical inactivity as evidenced by patient w/ planned sleeve gastrectomy surgery following dietary guidelines for continued weight loss.    NUTRITION INTERVENTION  Nutrition counseling (C-1) and education (E-2) to facilitate bariatric surgery goals.   Pre-Op Goals Reviewed with the Patient . Track food and beverage intake (pen and paper, MyFitness Pal, Baritastic app, etc.) . Make healthy food choices while monitoring portion sizes . Consume 3 meals per day or try to eat every 3-5 hours . Avoid concentrated sugars and fried foods . Keep sugar & fat in the single digits per serving on food labels . Practice CHEWING your food (aim for applesauce consistency) . Practice not drinking 15 minutes before, during, and 30 minutes after each meal and snack . Avoid all carbonated beverages (ex: soda, sparkling beverages)  . Limit caffeinated beverages (ex: coffee, tea, energy drinks) . Avoid all sugar-sweetened beverages (ex: regular soda, sports drinks)  . Avoid alcohol  . Aim for 64-100 ounces of FLUID daily (with at least half of fluid intake being plain water)  . Aim for at least 60-80 grams of PROTEIN daily . Look for a liquid protein source that contains ?15 g protein and ?5 g carbohydrate (ex: shakes, drinks, shots) . Make a list of non-food related activities . Physical activity is an important part of a healthy lifestyle so keep it moving! The goal is to reach 150 minutes of exercise per week, including cardiovascular and weight baring activity.  *Goals that are bolded indicate the  pt would like to start working towards these  Handouts Provided Include  . Bariatric Surgery handouts (Nutrition Visits, Pre-Op Goals, Protein Shakes, Vitamins & Minerals) . Should I eat . Mindful meals  Learning Style & Readiness for Change Teaching method  utilized: Visual & Auditory  Demonstrated degree of understanding via: Teach Back  Readiness Level: contemplative  Barriers to learning/adherence to lifestyle change: non hunger eating  RD's Notes for Next Visit . Assess pts adherence to chosen goals     MONITORING & EVALUATION Dietary intake, weekly physical activity, body weight, and pre-op goals reached at next nutrition visit.    Next Steps  Patient is to follow up at [facility] for Pre-Op Class >2 weeks before surgery for further nutrition education.

## 2020-04-05 ENCOUNTER — Encounter: Payer: Medicare Other | Attending: Surgery | Admitting: Skilled Nursing Facility1

## 2020-04-05 ENCOUNTER — Other Ambulatory Visit: Payer: Self-pay

## 2020-04-05 DIAGNOSIS — E119 Type 2 diabetes mellitus without complications: Secondary | ICD-10-CM | POA: Insufficient documentation

## 2020-04-05 DIAGNOSIS — E669 Obesity, unspecified: Secondary | ICD-10-CM | POA: Insufficient documentation

## 2020-04-05 NOTE — Progress Notes (Signed)
Supervised Weight Loss Visit Bariatric Nutrition Education  Planned Surgery: sleeve 1 out of 6 SWL Appointments   NUTRITION ASSESSMENT    Anthropometrics  Start weight at [facility]: 242.6 lbs (date: 03/15/2020)  Weight: 245.8 pounds Height: 64 in BMI: 41.61 kg/m2     Clinical  Medical hx: diabetes, fatty liver Medications: metformin, vitamin D, IBS-D Labs: A1C 5.7, K 3.4, CO2 19, calcium 8.4 Notable signs/symptoms: headaches, gait instability  Any previous deficiencies? Vitamin D  Lifestyle & Dietary Hx  Pt returns having logged her food and drink. Pt states he realized from logging her foods she gets strong cravings for foods around 10pm. Pt states she was able to reduce this behavior but it has been tough using reading or taking a melatonin because she will wake up to eat. Pt states she poops after everything she eats stating it is well formed and not lose. Pt states she watches 2 children over night   Estimated daily fluid intake: oz Supplements: multivitamin, b12 Current average weekly physical activity: ADL's  24-Hr Dietary Recall First Meal: apple or orange or bagel + bought egg white + cheese Snack:  Second Meal 12: low sodium [place] + cheese or bought grilled [place] burger + watmelon Snack:  Third Meal: chicken + rice + corn + olives + peppers  Snack:  Beverages: water + lemon + orange, water + flavorings, pepsi   Estimated Energy Needs Calories: 1500   NUTRITION DIAGNOSIS  Overweight/obesity (-3.3) related to past poor dietary habits and physical inactivity as evidenced by patient w/ planned sleeve surgery following dietary guidelines for continued weight loss.   NUTRITION INTERVENTION  Nutrition counseling (C-1) and education (E-2) to facilitate bariatric surgery goals.  Pre-Op Goals Progress & New Goals . Limit to one apple per day . Do not eat corn for 1 week . Avoid pepsi and sprite . Keep logging food and poops  Handouts Provided Include   Detailed  MyPlate  Learning Style & Readiness for Change Teaching method utilized: Visual & Auditory  Demonstrated degree of understanding via: Teach Back  Readiness Level: Action Barriers to learning/adherence to lifestyle change: none identified   RD's Notes for next Visit  . Assess pts adherence to chosen goals   MONITORING & EVALUATION Dietary intake, weekly physical activity, body weight, and pre-op goals in 1 month.   Next Steps  Patient is to return to [facility]

## 2020-04-06 NOTE — Progress Notes (Signed)
Subjective:    Patient ID: Marisa [name], female    DOB: May 10, 1975, 45 y.o.   MRN: [id number]  45 y.o.F here to est PCP.  04/09/20: Patient here to establish for primary care with history of previously diagnosed intrinsic asthma, sleep apnea, migraine headaches, obesity, type 2 diabetes, generalized anxiety disorder.  Note patient was in the emergency room 14th January with Covid she is now fully recovered from this.  She does complain of episodic headaches and has seen neurology in the past for this.  She is on no migraine medications at this time.  She does follow with neurosurgery for chronic cervical spine disease and also carpal tunnel syndrome.  She has a pending appointment with orthopedic hand for carpal tunnel.  She also has complaints of left hip pain with walking.  She does not smoke cigarettes or drink alcohol.  She is due a Tdap at this visit and will accept this.  She is wanting referral to pain management.  She also has vitamin D deficiency and is needing refills on her vitamin C supplementation weekly.  Blood sugar at home is in the 9710 range A1c last checked in October was 5.7.  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  She helps to take care of her sister who has mental health conditions and also her grandmother both of whom are here today for appointment separately     Past Medical History:  Diagnosis Date  . Abdominal pain 04/26/2013  . Abnormal uterine bleeding (AUB) 10/11/2012  . Acute bronchitis   . Allergy   . Anal pain    chronic  . Anemia   . Anxiety   . Asthma    exacerbation 02-28-2014 and 02-23-2014 secondary to Rhinovirus  . Atypical chest pain 04/26/2013  . Benign neoplasm of sigmoid colon   . Benign neoplasm of transverse colon   . Carbuncle of labium 07/12/2015  . Chest pain 02/20/2014  . Chronic diarrhea   . Chronic headaches   . Chronic low back pain   . Cigarette nicotine dependence without  complication 07/31/4313  . Cyst of right ovary   . Dandruff 03/26/2015  . Depression 03/27/2014  . Diabetes mellitus without complication ([place]) 4/00/8676  . Difficult intravenous access    PER PT NEEDS PICC LINE  . Dyspnea 09/07/2012   [name] 08/2012:  No obstruction by FEV1%, but probable restriction.    . Falls 03/27/2014  . Food allergy    Mushrooms, shellfish  . GAD (generalized anxiety disorder) 05/04/2014  . Gait disturbance 04/11/2014  . Gait instability   . GERD (gastroesophageal reflux disease)   . History of adenomatous polyp of colon   . History of cardiac arrest    during SVD 1992  . History of ectopic pregnancy    2009-  S/P LEFT SALPINGECTOMY  . History of panic attacks   . Hyperlipidemia   . IBS (irritable bowel syndrome)   . Insomnia 03/06/2014  . Joint pain   . Lower extremity edema   . Lumbar stenosis L4 -- L5 with bulging disk   w/ right leg weakness/ decreased mobility  . Migraine variant with headache 05/09/2014  . Mild obstructive sleep apnea    study 03-20-2014  no cpap recommended  . Neuromuscular disorder (HCC)    neuropathy in feet   . Neuropathic pain of both legs 06/04/2016  . Obesity (BMI 30-39.9) 03/05/2018  . OSA (obstructive sleep apnea) 03/06/2014  .  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  . Panniculitis 03/05/2018  . Pelvic pain 07/25/2013  . Persistent vomiting 04/27/2013  . PTSD (post-traumatic stress disorder) 05/04/2014  . Rash and nonspecific skin eruption 07/12/2015  . Rectal bleeding 07/25/2013  . RLQ abdominal pain   . S/P Total vaginal hysterectomy on 01/06/13 01/06/2013  . Severe recurrent major depressive disorder with psychotic features ([place]) 05/04/2014  . Sleep apnea    mild no cpap  . Social anxiety disorder 05/04/2014  . Sore throat 02/20/2014  . Stomach ulcer   . Tachycardia 02/20/2014  . Type 2 diabetes mellitus ([place])   . Weakness of right leg    FROM BACK PROBLEM PER PT     Family History  Problem Relation Age of Onset  .  Hypertension Mother   . Diabetes Mother   . Allergies Mother   . Heart disease Mother   . Clotting disorder Mother   . Stroke Mother   . Kidney disease Mother   . Thyroid disease Mother   . Cancer Father   . Hyperlipidemia Father   . Hypertension Father   . Colon cancer Father   . Liver disease Father   . Heart disease Maternal Grandmother   . Breast cancer Maternal Grandmother 90  . Schizophrenia Sister   . Bipolar disorder Sister   . Clotting disorder Sister   . Bipolar disorder Brother   . Kidney disease Brother   . Bipolar disorder Sister   . Pancreatic cancer Maternal Aunt   . Prostate cancer Maternal Uncle   . Liver cancer Maternal Grandfather   . Rectal cancer Maternal Grandfather   . Liver cancer Paternal Grandfather   . Colon polyps Neg Hx   . Esophageal cancer Neg Hx   . Stomach cancer Neg Hx      Social History   Socioeconomic History  . Marital status: Widowed    Spouse name: Not on file  . Number of children: 1  . Years of education: Not on file  . Highest education level: Not on file  Occupational History  . Occupation: disability  Tobacco Use  . Smoking status: Former Smoker    Packs/day: 0.20    Years: 11.00    Pack years: 2.20    Types: Cigarettes    Quit date: 11/17/2013    Years since quitting: 6.3  . Smokeless tobacco: Never Used  . Tobacco comment: 2 years quit  Vaping Use  . Vaping Use: Never used  Substance and Sexual Activity  . Alcohol use: No    Alcohol/week: 0.0 standard drinks  . Drug use: No  . Sexual activity: Yes    Birth control/protection: Surgical  Other Topics Concern  . Not on file  Social History Narrative   Lives with sister   Drinks no caffeine   Social Determinants of Health   Financial Resource Strain: Not on file  Food Insecurity: Not on file  Transportation Needs: Not on file  Physical Activity: Not on file  Stress: Not on file  Social Connections: Not on file  Intimate Partner Violence: Not on file      Allergies  Allergen Reactions  . Asa [Aspirin] Anaphylaxis    Hives, chest tightness   . Mushroom Extract Complex Anaphylaxis, Swelling and Other (See Comments)    Reaction:  Eye swelling  . Penicillins Anaphylaxis and Other (See Comments)    Has patient had a PCN reaction causing immediate rash, facial/tongue/throat swelling, SOB or lightheadedness with hypotension: Yes Has patient had  a PCN reaction causing severe rash involving mucus membranes or skin necrosis: No Has patient had a PCN reaction that required hospitalization No Has patient had a PCN reaction occurring within the last 10 years: No If all of the above answers are "NO", then may proceed with Cephalosporin use.  . Shellfish Allergy Anaphylaxis  . Triamcinolone Other (See Comments)    Skin issues      Outpatient Medications Prior to Visit  Medication Sig Dispense Refill  . ibuprofen (ADVIL) 600 MG tablet Take 1 tablet (600 mg total) by mouth every 8 (eight) hours as needed. 30 tablet 0  . Vitamin D, Ergocalciferol, (DRISDOL) 1.25 MG (50000 UNIT) CAPS capsule Take 1 capsule (50,000 Units total) by mouth once a week. 4 capsule 2  . albuterol (VENTOLIN HFA) 108 (90 Base) MCG/ACT inhaler Inhale 2 puffs into the lungs every 6 (six) hours as needed for wheezing or shortness of breath. 18 g 11  . albuterol (VENTOLIN HFA) 108 (90 Base) MCG/ACT inhaler Inhale 2 puffs into the lungs every 4 (four) hours as needed for wheezing or shortness of breath. 1 each 0  . dicyclomine (BENTYL) 10 MG capsule Take 1 capsule (10 mg total) by mouth 2 (two) times daily. 60 capsule 1  . gabapentin (NEURONTIN) 300 MG capsule Take 2 capsules (600 mg) three times per day 540 capsule 1  . metFORMIN (GLUCOPHAGE) 500 MG tablet Take 1 tablet (500 mg total) by mouth 2 (two) times daily with a meal. 60 tablet 5  . ondansetron (ZOFRAN ODT) 4 MG disintegrating tablet Take 1 tablet (4 mg total) by mouth every 8 (eight) hours as needed for nausea or vomiting. 12  tablet 0  . pantoprazole (PROTONIX) 40 MG tablet Take 1 tablet (40 mg total) by mouth daily. 30 tablet 3  . promethazine (PHENERGAN) 25 MG tablet Take 25 mg by mouth every 6 (six) hours as needed for nausea or vomiting.    . Semaglutide (RYBELSUS) 7 MG TABS Take 7 mg by mouth daily. 30 tablet 0  . sucralfate (CARAFATE) 1 g tablet Take 1 g by mouth 4 (four) times daily -  with meals and at bedtime.    . traMADol (ULTRAM) 50 MG tablet Take 1 tablet (50 mg total) by mouth every 6 (six) hours as needed. 30 tablet 0  . ALPRAZolam (XANAX) 1 MG tablet Take one pill 15-30 minutes prior to procedures (Patient not taking: Reported on 04/09/2020) 6 tablet 0  . predniSONE (DELTASONE) 20 MG tablet Take 2 tablets (40 mg total) by mouth daily. (Patient not taking: Reported on 04/09/2020) 8 tablet 0   No facility-administered medications prior to visit.    Review of Systems  Constitutional: Negative.   HENT: Negative.   Eyes: Negative.   Respiratory: Negative.   Cardiovascular: Negative.   Gastrointestinal: Negative.   Endocrine: Negative.   Genitourinary: Negative.   Musculoskeletal: Positive for back pain and neck pain.  Skin: Negative.   Neurological: Positive for headaches.  Psychiatric/Behavioral: The patient is nervous/anxious.        Objective:   Physical Exam Vitals:   04/09/20 0835  BP: 115/79  Pulse: 78  SpO2: 97%  Weight: 246 lb 6.4 oz (111.8 kg)  Height: 5\' 4"  (1.626 m)    Gen: Pleasant,obese, in no distress,  normal affect  ENT: No lesions,  mouth clear,  oropharynx clear, no postnasal drip  Neck: No JVD, no TMG, no carotid bruits  Lungs: No use of accessory muscles, no dullness to  percussion, clear without rales or rhonchi  Cardiovascular: RRR, heart sounds normal, no murmur or gallops, no peripheral edema  Abdomen: soft and NT, no HSM,  BS normal  Musculoskeletal: No deformities, no cyanosis or clubbing  Neuro: alert, non focal  Skin: Warm, no lesions or  rashes       Assessment & Plan:  I personally reviewed all images and lab data in the [facility] system as well as any outside material available during this office visit and agree with the  radiology impressions.   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) Dietary recommendations given  Vitamin D deficiency Renewed vitamin D supplementation  Chronic pain syndrome Referral made for chronic pain management to [place] was seen today for establish care.  Diagnoses and all orders for this visit:  Type 2 diabetes mellitus with other specified complication, without long-term current use of insulin (HCC) -     Semaglutide (RYBELSUS) 7 MG TABS; Take 7 mg by mouth daily.  Type 2 diabetes mellitus with diabetic neuropathy, without long-term current use of insulin (HCC) -     Microalbumin / creatinine urine ratio  Type 2 diabetes mellitus without complication, without long-term current use of insulin (HCC) -     metFORMIN (GLUCOPHAGE) 500 MG tablet; Take 1 tablet (500 mg total) by mouth 2 (two) times daily with a meal.  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 -     pantoprazole (PROTONIX) 40 MG tablet; Take 1 tablet (40 mg total) by mouth daily. -     albuterol (VENTOLIN HFA) 108 (90 Base) MCG/ACT inhaler; Inhale 2 puffs into the lungs every 4 (four) hours as needed for wheezing or shortness of breath. -     dicyclomine (BENTYL) 10 MG capsule; Take 1 capsule (10 mg total) by mouth 2 (two) times daily. -     traMADol (ULTRAM) 50 MG tablet; Take 1 tablet (50 mg  total) by mouth every 6 (six) hours as needed. -     Tdap vaccine greater than or equal to 7yo IM   Tetanus vaccine given at this visit

## 2020-04-09 ENCOUNTER — Encounter: Payer: Self-pay | Admitting: Critical Care Medicine

## 2020-04-09 ENCOUNTER — Other Ambulatory Visit: Payer: Self-pay

## 2020-04-09 ENCOUNTER — Ambulatory Visit: Payer: Medicare Other | Attending: Critical Care Medicine | Admitting: Critical Care Medicine

## 2020-04-09 VITALS — BP 115/79 | HR 78 | Ht 64.0 in | Wt 246.4 lb

## 2020-04-09 DIAGNOSIS — Z23 Encounter for immunization: Secondary | ICD-10-CM

## 2020-04-09 DIAGNOSIS — E559 Vitamin D deficiency, unspecified: Secondary | ICD-10-CM

## 2020-04-09 DIAGNOSIS — G5793 Unspecified mononeuropathy of bilateral lower limbs: Secondary | ICD-10-CM | POA: Diagnosis not present

## 2020-04-09 DIAGNOSIS — F411 Generalized anxiety disorder: Secondary | ICD-10-CM

## 2020-04-09 DIAGNOSIS — Z6836 Body mass index (BMI) 36.0-36.9, adult: Secondary | ICD-10-CM

## 2020-04-09 DIAGNOSIS — E1169 Type 2 diabetes mellitus with other specified complication: Secondary | ICD-10-CM | POA: Diagnosis not present

## 2020-04-09 DIAGNOSIS — M5021 Other cervical disc displacement,  high cervical region: Secondary | ICD-10-CM

## 2020-04-09 DIAGNOSIS — J45909 Unspecified asthma, uncomplicated: Secondary | ICD-10-CM

## 2020-04-09 DIAGNOSIS — E119 Type 2 diabetes mellitus without complications: Secondary | ICD-10-CM

## 2020-04-09 DIAGNOSIS — G894 Chronic pain syndrome: Secondary | ICD-10-CM

## 2020-04-09 DIAGNOSIS — F3341 Major depressive disorder, recurrent, in partial remission: Secondary | ICD-10-CM

## 2020-04-09 DIAGNOSIS — E1140 Type 2 diabetes mellitus with diabetic neuropathy, unspecified: Secondary | ICD-10-CM | POA: Diagnosis not present

## 2020-04-09 DIAGNOSIS — G43809 Other migraine, not intractable, without status migrainosus: Secondary | ICD-10-CM

## 2020-04-09 DIAGNOSIS — D125 Benign neoplasm of sigmoid colon: Secondary | ICD-10-CM

## 2020-04-09 MED ORDER — TRAMADOL HCL 50 MG PO TABS: 50.0000 mg | ORAL_TABLET | Freq: Four times a day (QID) | ORAL | 0 refills | Status: DC | PRN

## 2020-04-09 MED ORDER — PANTOPRAZOLE SODIUM 40 MG PO TBEC: 40.0000 mg | DELAYED_RELEASE_TABLET | Freq: Every day | ORAL | 3 refills | Status: DC

## 2020-04-09 MED ORDER — METFORMIN HCL 500 MG PO TABS: 500.0000 mg | ORAL_TABLET | Freq: Two times a day (BID) | ORAL | 5 refills | Status: DC

## 2020-04-09 MED ORDER — ALBUTEROL SULFATE HFA 108 (90 BASE) MCG/ACT IN AERS: 2.0000 | INHALATION_SPRAY | RESPIRATORY_TRACT | 0 refills | Status: DC | PRN

## 2020-04-09 MED ORDER — GABAPENTIN 300 MG PO CAPS: ORAL_CAPSULE | ORAL | 1 refills | Status: DC

## 2020-04-09 MED ORDER — DICYCLOMINE HCL 10 MG PO CAPS: 10.0000 mg | ORAL_CAPSULE | Freq: Two times a day (BID) | ORAL | 1 refills | Status: DC

## 2020-04-09 MED ORDER — RYBELSUS 7 MG PO TABS: 7.0000 mg | ORAL_TABLET | Freq: Every day | ORAL | 0 refills | Status: DC

## 2020-04-09 NOTE — Assessment & Plan Note (Signed)
Referral made for chronic pain management to [facility]

## 2020-04-09 NOTE — Assessment & Plan Note (Signed)
Migraine variant with headache will observe for now

## 2020-04-09 NOTE — Assessment & Plan Note (Signed)
Stable at this time continue albuterol as needed

## 2020-04-09 NOTE — Patient Instructions (Addendum)
Refills on all your medications sent to your [facility] pharmacy  Referral to psychiatry and pain management clinic will be made  Labs today is urine microalbumin  You do not need a Pap smear and that you have had a hysterectomy  Tetanus vaccine was given you deferred the Pneumovax  Please get your Materna booster vaccine below is where you can find 1 COVID-19 Vaccine Information can be found at: ShippingScam.co.uk For questions related to vaccine distribution or appointments, please email [email or web address] or call [phone].   Keep your upcoming appointments with neurosurgery and the hand orthopedic doctor Dr. Christella Noa sent you    Return to see Dr. Joya Gaskins in 2 months  I gave you a short-term refill on tramadol to your [place] and referral to pain management was sent

## 2020-04-09 NOTE — Progress Notes (Signed)
Has pain in back of head, sharp stabbing pain. Having pain in left hip.  Lump in right fore arm.  Anxiety.  Needs medication refills.

## 2020-04-09 NOTE — Assessment & Plan Note (Signed)
Dietary recommendations given

## 2020-04-09 NOTE — Assessment & Plan Note (Signed)
Has follow-up plan with gastroenterology for repeat colonoscopy

## 2020-04-09 NOTE — Assessment & Plan Note (Signed)
Referral to psychiatry made

## 2020-04-09 NOTE — Assessment & Plan Note (Signed)
Renewed vitamin D supplementation

## 2020-04-09 NOTE — Assessment & Plan Note (Signed)
Significant peripheral neuropathy likely playing a role in patient's chronic pain

## 2020-04-09 NOTE — Assessment & Plan Note (Signed)
Care per neurosurgery

## 2020-04-09 NOTE — Assessment & Plan Note (Signed)
Type 2 diabetes without long-term use of insulin well controlled on Metformin alone no changes made

## 2020-04-10 ENCOUNTER — Telehealth: Payer: Self-pay

## 2020-04-10 ENCOUNTER — Other Ambulatory Visit: Payer: Self-pay | Admitting: Critical Care Medicine

## 2020-04-10 DIAGNOSIS — E1169 Type 2 diabetes mellitus with other specified complication: Secondary | ICD-10-CM | POA: Insufficient documentation

## 2020-04-10 DIAGNOSIS — E785 Hyperlipidemia, unspecified: Secondary | ICD-10-CM

## 2020-04-10 LAB — MICROALBUMIN / CREATININE URINE RATIO
Creatinine, Urine: 78.4 mg/dL
Microalb/Creat Ratio: <4 mg/g creat (ref 0–29)
Microalbumin, Urine: <3 ug/mL

## 2020-04-10 MED ORDER — ATORVASTATIN CALCIUM 10 MG PO TABS: 10.0000 mg | ORAL_TABLET | Freq: Every day | ORAL | 3 refills | Status: DC

## 2020-04-10 MED ORDER — ALBUTEROL SULFATE HFA 108 (90 BASE) MCG/ACT IN AERS: 2.0000 | INHALATION_SPRAY | Freq: Four times a day (QID) | RESPIRATORY_TRACT | 0 refills | Status: DC | PRN

## 2020-04-10 NOTE — Addendum Note (Signed)
Addended by: Elsie Stain on: 04/10/2020 03:22 PM   Modules accepted: Orders

## 2020-04-10 NOTE — Telephone Encounter (Signed)
Ins prefers the Proair HFA inhaler, if appropriate can you please resend albuterol for the Proair [facility]

## 2020-04-10 NOTE — Progress Notes (Signed)
Added atorvastatin for T2DM and LDL 130

## 2020-04-12 ENCOUNTER — Telehealth: Payer: Self-pay | Admitting: Critical Care Medicine

## 2020-04-12 MED ORDER — ALBUTEROL SULFATE HFA 108 (90 BASE) MCG/ACT IN AERS: 2.0000 | INHALATION_SPRAY | Freq: Four times a day (QID) | RESPIRATORY_TRACT | 2 refills | Status: DC | PRN

## 2020-04-12 NOTE — Telephone Encounter (Signed)
Rx sent 

## 2020-04-12 NOTE — Telephone Encounter (Signed)
[facility] called and stated the the Rx for albuterol (VENTOLIN HFA) 108 (90 Base) MCG/ACT inhaler Is not covered by Pts insurance and they wanted to get approval to change the Rx to Proventil / please advise

## 2020-04-12 NOTE — Telephone Encounter (Signed)
See TE below

## 2020-05-02 ENCOUNTER — Other Ambulatory Visit: Payer: Self-pay

## 2020-05-02 ENCOUNTER — Encounter: Payer: Medicare Other | Attending: Surgery | Admitting: Skilled Nursing Facility1

## 2020-05-02 DIAGNOSIS — Z6836 Body mass index (BMI) 36.0-36.9, adult: Secondary | ICD-10-CM | POA: Insufficient documentation

## 2020-05-02 DIAGNOSIS — E1140 Type 2 diabetes mellitus with diabetic neuropathy, unspecified: Secondary | ICD-10-CM | POA: Diagnosis present

## 2020-05-02 NOTE — Progress Notes (Signed)
Supervised Weight Loss Visit Bariatric Nutrition Education  Planned Surgery: sleeve 2 out of 6 SWL Appointments   NUTRITION ASSESSMENT    Anthropometrics  Start weight at [facility]: 242.6 lbs (date: 03/15/2020)  Weight: 250 pounds Height: 64 in BMI: 42.91 kg/m2     Clinical  Medical hx: diabetes, fatty liver Medications: metformin, vitamin D, IBS-D Labs: A1C 5.7, K 3.4, CO2 19, calcium 8.4 Notable signs/symptoms: headaches, gait instability  Any previous deficiencies? Vitamin D  Lifestyle & Dietary Hx  Getting married in July  Pt arrives with hip pain and limping due to it. Pt states she cut out corn and apple which helped her to only have 2 poops a day which she is extremely excited about. Pt states he wakes at 5am. Pt states logging her foods is helpful for her and started calorie counting which she likes. Pt states she had a family gathering for a funeral so states that is where the weight gain came from. Pt states she is passively counting calories not aiming for a goal which will stress her. Pt states when she wakes in pain she will eat chips in the night.   Estimated daily fluid intake: 64 oz Supplements: multivitamin, b12 Current average weekly physical activity: ADL's  24-Hr Dietary Recall First Meal 7:30-8: premier protein Snack:  Second Meal 12: low sodium [place] + cheese or bought grilled [place] burger + watermelon or fruit salad Snack:  Third Meal: stewed [place] and noodles Snack: craving eating: chips  Beverages: water + lemon + orange, water + flavorings, pepsi, sparkling ice water  Estimated Energy Needs Calories: 1500   NUTRITION DIAGNOSIS  Overweight/obesity ([place]-3.3) related to past poor dietary habits and physical inactivity as evidenced by patient w/ planned sleeve surgery following dietary guidelines for continued weight loss.   NUTRITION INTERVENTION  Nutrition counseling (C-1) and education (E-2) to facilitate bariatric surgery goals.  Pre-Op Goals  Progress & New Goals . Limit to one apple per day . Do not eat corn for 1 week . Avoid pepsi and sprite . Keep logging food and poops . NEW: when you get a Chip craving read instead . NEW: do not buy any chips, ask son to keep them in his room . NEW: be sure to eat non starchy vegetables with every dinner  Handouts Provided Include   Detailed MyPlate  Learning Style & Readiness for Change Teaching method utilized: Visual & Auditory  Demonstrated degree of understanding via: Teach Back  Readiness Level: Action Barriers to learning/adherence to lifestyle change: none identified   RD's Notes for next Visit  . Assess pts adherence to chosen goals   MONITORING & EVALUATION Dietary intake, weekly physical activity, body weight, and pre-op goals in 1 month.   Next Steps  Patient is to return to [facility]

## 2020-05-07 ENCOUNTER — Other Ambulatory Visit: Payer: Self-pay | Admitting: Critical Care Medicine

## 2020-05-07 DIAGNOSIS — E1169 Type 2 diabetes mellitus with other specified complication: Secondary | ICD-10-CM

## 2020-05-14 ENCOUNTER — Telehealth: Payer: Self-pay | Admitting: Critical Care Medicine

## 2020-05-14 NOTE — Telephone Encounter (Signed)
Pt. States she wants her PCP to sign form. Will place in PCP box.

## 2020-05-15 NOTE — Telephone Encounter (Signed)
Paperwork has been placed upfront for pick up. VM has been left .

## 2020-05-30 ENCOUNTER — Other Ambulatory Visit: Payer: Self-pay

## 2020-05-30 ENCOUNTER — Encounter: Payer: Medicare Other | Attending: Surgery | Admitting: Skilled Nursing Facility1

## 2020-05-30 DIAGNOSIS — E669 Obesity, unspecified: Secondary | ICD-10-CM | POA: Diagnosis present

## 2020-05-30 DIAGNOSIS — E119 Type 2 diabetes mellitus without complications: Secondary | ICD-10-CM | POA: Diagnosis not present

## 2020-05-30 NOTE — Progress Notes (Signed)
Supervised Weight Loss Visit Bariatric Nutrition Education  Planned Surgery: sleeve 3 out of 6 SWL Appointments   Sticker: YES  NUTRITION ASSESSMENT    Anthropometrics  Start weight at [facility]: 242.6 lbs (date: 03/15/2020)  Weight: 249.1 pounds Height: 64 in BMI: 42.76 kg/m2     Clinical  Medical hx: diabetes, fatty liver, IBS-D Medications: metformin, vitamin D, rybelsus  Labs: A1C 5.7, K 3.4, CO2 19, calcium 8.4 Notable signs/symptoms: headaches, gait instability  Any previous deficiencies? Vitamin D  Lifestyle & Dietary Hx  Getting married in July  Pt states she cut out corn and limited apple which helped her to only have 2 poops a day which she is extremely excited about. Pt states he wakes at 5am. Pt states she is passively counting calories not aiming for a goal which will stress her.   Pt arrived with some specific questions: these questions were answered to her satisfaction.  Pt states since making dietary changes she has felt less bloated overall.   Estimated daily fluid intake: 64 oz Supplements: multivitamin, b12 Current average weekly physical activity: ADL's  24-Hr Dietary Recall First Meal 7:30-8: premier protein or fairlife Snack:  Second Meal 12: apple + sandwich or frozen meal Snack:  Third Meal: stewed [place] and noodles or fish + cauliflower rice or peppers and steak Snack: nuts + cranberries pack Beverages: alkaline water + lemon + orange, water + flavorings, pepsi, sparkling ice water, apple juice   Estimated Energy Needs Calories: 1500   NUTRITION DIAGNOSIS  Overweight/obesity ([place]-3.3) related to past poor dietary habits and physical inactivity as evidenced by patient w/ planned sleeve surgery following dietary guidelines for continued weight loss.   NUTRITION INTERVENTION  Nutrition counseling (C-1) and education (E-2) to facilitate bariatric surgery goals. Educated pt on these dietary changes leading to a continued control of her blood  sugars within the context of diabetes   Pre-Op Goals Progress & New Goals . Limit to one apple per day . Do not eat corn for 1 week . Avoid pepsi and sprite . Keep logging food and poops . continue: when you get a Chip craving read instead . continue: do not buy any chips, ask son to keep them in his room . continue: be sure to eat non starchy vegetables with every dinner . NEW: do not drink anything 30 minutes after you have eaten . NEW: do not down your water all at once, drink it in smaller amounts throughout the day . NEW: Great job not eating in the middle of the night! . NEW: Add one piece of fruit with your protein shake in the morning; pay attention to if a whole banana causes any bloat or tight tummy . NEW: Aim to have a non-starchy vegetable with every dinner (along with the protein and carbohydrate)  Handouts Previously Provided Include   Detailed MyPlate  Learning Style & Readiness for Change Teaching method utilized: Visual & Auditory  Demonstrated degree of understanding via: Teach Back  Readiness Level: Action Barriers to learning/adherence to lifestyle change: none identified   RD's Notes for next Visit  . Assess pts adherence to chosen goals   MONITORING & EVALUATION Dietary intake, weekly physical activity, body weight, and pre-op goals    Next Steps  Patient is to return to [facility] for next SWL

## 2020-06-14 ENCOUNTER — Ambulatory Visit: Payer: Medicare Other | Admitting: Critical Care Medicine

## 2020-06-26 ENCOUNTER — Encounter: Payer: Self-pay | Admitting: Critical Care Medicine

## 2020-06-26 ENCOUNTER — Other Ambulatory Visit: Payer: Self-pay

## 2020-06-26 ENCOUNTER — Ambulatory Visit: Payer: Medicare Other | Attending: Critical Care Medicine | Admitting: Critical Care Medicine

## 2020-06-26 DIAGNOSIS — E1140 Type 2 diabetes mellitus with diabetic neuropathy, unspecified: Secondary | ICD-10-CM | POA: Diagnosis not present

## 2020-06-26 DIAGNOSIS — J309 Allergic rhinitis, unspecified: Secondary | ICD-10-CM | POA: Insufficient documentation

## 2020-06-26 DIAGNOSIS — J45909 Unspecified asthma, uncomplicated: Secondary | ICD-10-CM | POA: Diagnosis not present

## 2020-06-26 DIAGNOSIS — E785 Hyperlipidemia, unspecified: Secondary | ICD-10-CM

## 2020-06-26 DIAGNOSIS — G894 Chronic pain syndrome: Secondary | ICD-10-CM

## 2020-06-26 DIAGNOSIS — K529 Noninfective gastroenteritis and colitis, unspecified: Secondary | ICD-10-CM

## 2020-06-26 DIAGNOSIS — J302 Other seasonal allergic rhinitis: Secondary | ICD-10-CM

## 2020-06-26 DIAGNOSIS — E559 Vitamin D deficiency, unspecified: Secondary | ICD-10-CM

## 2020-06-26 DIAGNOSIS — E1169 Type 2 diabetes mellitus with other specified complication: Secondary | ICD-10-CM

## 2020-06-26 MED ORDER — LORATADINE 10 MG PO TABS: 10.0000 mg | ORAL_TABLET | Freq: Every day | ORAL | 4 refills | Status: DC

## 2020-06-26 MED ORDER — TRAMADOL HCL 50 MG PO TABS: 50.0000 mg | ORAL_TABLET | Freq: Four times a day (QID) | ORAL | 0 refills | Status: DC | PRN

## 2020-06-26 MED ORDER — VITAMIN D (ERGOCALCIFEROL) 1.25 MG (50000 UNIT) PO CAPS: 50000.0000 [IU] | ORAL_CAPSULE | ORAL | 2 refills | Status: DC

## 2020-06-26 MED ORDER — MONTELUKAST SODIUM 10 MG PO TABS: 1.0000 | ORAL_TABLET | Freq: Every day | ORAL | 2 refills | Status: DC

## 2020-06-26 MED ORDER — OLOPATADINE HCL 0.2 % OP SOLN: OPHTHALMIC | 0 refills | Status: DC

## 2020-06-26 NOTE — Assessment & Plan Note (Signed)
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

## 2020-06-26 NOTE — Assessment & Plan Note (Signed)
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

## 2020-06-26 NOTE — Assessment & Plan Note (Signed)
Begin loratadine daily refill montelukast and give Pataday eyedrops

## 2020-06-26 NOTE — Assessment & Plan Note (Signed)
Patient is yet to see the pain management clinic she will do so when she returns to [facility] referrals in place  I refilled her tramadol at this visit and checked the [place] there are no frequent other's prescribers

## 2020-06-26 NOTE — Assessment & Plan Note (Signed)
Continue statin therapy.

## 2020-06-26 NOTE — Assessment & Plan Note (Signed)
Chronic recurrent diarrhea stable at this time
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
## 2020-06-27 ENCOUNTER — Encounter: Payer: Medicare Other | Attending: Surgery | Admitting: Skilled Nursing Facility1

## 2020-06-27 ENCOUNTER — Ambulatory Visit: Payer: Medicare Other | Admitting: Skilled Nursing Facility1

## 2020-06-27 DIAGNOSIS — E669 Obesity, unspecified: Secondary | ICD-10-CM | POA: Insufficient documentation

## 2020-06-27 DIAGNOSIS — E1140 Type 2 diabetes mellitus with diabetic neuropathy, unspecified: Secondary | ICD-10-CM | POA: Insufficient documentation
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
## 2020-07-31 ENCOUNTER — Ambulatory Visit: Payer: Medicare Other | Admitting: Skilled Nursing Facility1

## 2020-07-31 ENCOUNTER — Encounter: Payer: Medicare Other | Attending: Surgery | Admitting: Skilled Nursing Facility1

## 2020-07-31 DIAGNOSIS — E669 Obesity, unspecified: Secondary | ICD-10-CM | POA: Diagnosis present

## 2020-07-31 NOTE — Progress Notes (Addendum)
Supervised Weight Loss Visit Bariatric Nutrition Education  Planned Surgery: sleeve 5 out of 6 SWL Appointments    NUTRITION ASSESSMENT  Appt conducted virtually pt verbally agreed to the limitations of this visit type; pt identified by name and DOB   Anthropometrics  Start weight at [facility]: 242.6 lbs (date: 03/15/2020)  Weight: virtual appt Height: 64 in BMI: 42.57 kg/m2     Clinical  Medical hx: diabetes, fatty liver, IBS-D Medications: metformin, vitamin D, rybelsus  Labs: A1C 5.7, K 3.4, CO2 19, calcium 8.4 Notable signs/symptoms: headaches, gait instability  Any previous deficiencies? Vitamin D  Lifestyle & Dietary Hx  Getting married in July in [place] states she is still stuck in [place] but will be back in [place] in 2 weeks. 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 Pt states she wants to lose 5 poun byt her next appt.   Estimated daily fluid intake: 64 oz Supplements: multivitamin, b12 Current average weekly physical activity: walking 4 days a week  24-Hr Dietary Recall First Meal 7:30-8: premier protein or fairlife or eggs + [place] sausage + potato Snack: fig newton Second Meal: cauliflower crust pizza Snack:  Third Meal: broccoli + chicken or spagetti Beverages: alkaline water + lemon + orange, water + flavorings, apple juice, mint tea + ginger + lemon + honey, sugar free twist  Estimated Energy Needs Calories: 1500   NUTRITION DIAGNOSIS  Overweight/obesity ([place]-3.3) related to past poor dietary habits and physical inactivity as evidenced by patient w/ planned sleeve surgery following dietary guidelines for continued weight loss.   NUTRITION INTERVENTION  Nutrition counseling (C-1) and education (E-2) to facilitate bariatric surgery goals. Educated pt on these dietary changes leading to a continued control of her blood sugars within the context of diabetes   Pre-Op Goals Progress & New Goals Limit to one apple per day Do not eat  corn for 1 week Avoid pepsi and sprite Keep logging food and poops continue: when you get a Chip craving read instead continue: do not buy any chips, ask son to keep them in his room continue: be sure to eat non starchy vegetables with every dinner continue: do not drink anything 30 minutes after you have eaten continue: do not down your water all at once, drink it in smaller amounts throughout the day continue: [place] job not eating in the middle of the night! continue: Add one piece of fruit with your protein shake in the morning; pay attention to if a whole banana causes any bloat or tight tummy (found out carbonation as well as banana causing her to bloat) Continue: Aim to have a non-starchy vegetable with every dinner (along with the protein and carbohydrate) Continue: if going off track lean on your support system NEW: drink more water  Handouts Previously Provided Include  Detailed MyPlate  Learning Style & Readiness for Change Teaching method utilized: Visual & Auditory  Demonstrated degree of understanding via: Teach Back  Readiness Level: Action Barriers to learning/adherence to lifestyle change: none identified   RD's Notes for next Visit  Assess pts adherence to chosen goals   MONITORING & EVALUATION Dietary intake, weekly physical activity, body weight, and pre-op goals    Next Steps  Patient is to return to [facility] for next SWL

## 2020-08-24 ENCOUNTER — Ambulatory Visit: Payer: Medicare Other | Admitting: Psychology

## 2020-08-30 ENCOUNTER — Ambulatory Visit: Payer: Medicare Other | Admitting: Skilled Nursing Facility1

## 2020-09-10 ENCOUNTER — Encounter: Payer: Medicare Other | Attending: Surgery | Admitting: Skilled Nursing Facility1

## 2020-09-10 ENCOUNTER — Other Ambulatory Visit: Payer: Self-pay

## 2020-09-10 DIAGNOSIS — E1140 Type 2 diabetes mellitus with diabetic neuropathy, unspecified: Secondary | ICD-10-CM | POA: Diagnosis present

## 2020-09-10 DIAGNOSIS — Z6836 Body mass index (BMI) 36.0-36.9, adult: Secondary | ICD-10-CM | POA: Diagnosis present

## 2020-09-10 NOTE — Progress Notes (Signed)
Supervised Weight Loss Visit Bariatric Nutrition Education  Planned Surgery: sleeve 6 out of 6 SWL Appointments   Pt completed visits.   Pt has cleared nutrition requirements.   NUTRITION ASSESSMENT  Anthropometrics  Start weight at [facility]: 242.6 lbs (date: 03/15/2020)  Weight: 254.3 pounds Height: 64 in BMI: 43.63 kg/m2     Clinical  Medical hx: diabetes, fatty liver, IBS-D Medications: metformin, vitamin D, rybelsus  Labs: A1C 5.7, K 3.4, CO2 19, calcium 8.4 Notable signs/symptoms: headaches, gait instability  Any previous deficiencies? Vitamin D  Lifestyle & Dietary Hx  Pt states she has been taking steroids for the last 3-4 weeks for asthma so has increased hunger and never feels full.  Pt states she has been meal prepping which has been helpful. Pt states she has cut back on rice. Pt state she has increased her water.   Pt states in the last 6 months she has learned better eating habits, to take her time with eating, and to not eat and drink at the same time and redirect herself when a craving is coming on.   Pt states she feels keeping food down after surgery may be difficult as well as staying on the path.   Estimated daily fluid intake: 64 oz Supplements: multivitamin, b12 Current average weekly physical activity: walking 4 days a week  24-Hr Dietary Recall First Meal 7:30-8: bagel or eggs or breakfast shake Snack: fig newton Second Meal: tuna macaroni salad or tuna sandwich Snack:  Third Meal: broccoli + chicken or spagetti or baked pork chop Beverages: alkaline water + lemon + orange, water + flavorings, apple juice, mint tea + ginger + lemon + honey, sugar free twist  Estimated Energy Needs Calories: 1500   NUTRITION DIAGNOSIS  Overweight/obesity ([place]-3.3) related to past poor dietary habits and physical inactivity as evidenced by patient w/ planned sleeve surgery following dietary guidelines for continued weight loss.   NUTRITION INTERVENTION  Nutrition  counseling (C-1) and education (E-2) to facilitate bariatric surgery goals. Educated pt on these dietary changes leading to a continued control of her blood sugars within the context of diabetes   Pre-Op Goals Progress & New Goals Limit to one apple per day Do not eat corn for 1 week Avoid pepsi and sprite Keep logging food and poops continue: when you get a Chip craving read instead continue: do not buy any chips, ask son to keep them in his room continue: be sure to eat non starchy vegetables with every dinner continue: do not drink anything 30 minutes after you have eaten continue: do not down your water all at once, drink it in smaller amounts throughout the day continue: [place] job not eating in the middle of the night! continue: Add one piece of fruit with your protein shake in the morning; pay attention to if a whole banana causes any bloat or tight tummy (found out carbonation as well as banana causing her to bloat) Continue: Aim to have a non-starchy vegetable with every dinner (along with the protein and carbohydrate) Continue: if going off track lean on your support system continue: drink more water  Handouts Previously Provided Include  Detailed MyPlate  Learning Style & Readiness for Change Teaching method utilized: Visual & Auditory  Demonstrated degree of understanding via: Teach Back  Readiness Level: Action Barriers to learning/adherence to lifestyle change: none identified   RD's Notes for next Visit  Assess pts adherence to chosen goals   MONITORING & EVALUATION Dietary intake, weekly physical activity, body weight,  and pre-op goals    Next Steps  Patient is to return to [facility] for pre-op diet Pt has completed visits. No further supervised visits required

## 2020-09-12 ENCOUNTER — Ambulatory Visit: Payer: Medicare Other | Admitting: Psychology

## 2020-10-05 ENCOUNTER — Ambulatory Visit (INDEPENDENT_AMBULATORY_CARE_PROVIDER_SITE_OTHER): Payer: Medicare Other | Admitting: Psychology

## 2020-10-11 ENCOUNTER — Ambulatory Visit: Payer: Medicare Other | Admitting: Psychology

## 2020-10-11 ENCOUNTER — Ambulatory Visit (INDEPENDENT_AMBULATORY_CARE_PROVIDER_SITE_OTHER): Payer: Medicare Other | Admitting: Psychology

## 2020-10-11 DIAGNOSIS — F509 Eating disorder, unspecified: Secondary | ICD-10-CM

## 2020-11-01 ENCOUNTER — Other Ambulatory Visit: Payer: Self-pay | Admitting: Critical Care Medicine

## 2020-11-01 DIAGNOSIS — E1169 Type 2 diabetes mellitus with other specified complication: Secondary | ICD-10-CM

## 2020-11-01 NOTE — Telephone Encounter (Signed)
Requested medication (s) are due for refill today: Yes  Requested medication (s) are on the active medication list: Yes  Last refill:  05/07/20  Future visit scheduled: No  Notes to clinic:  See request.    Requested Prescriptions  Pending Prescriptions Disp Refills   RYBELSUS 7 MG TABS [Pharmacy Med Name: Rybelsus 7 MG Oral Tablet] 30 tablet 0    Sig: Take 1 tablet by mouth once daily     Off-Protocol Failed - 11/01/2020  9:42 AM      Failed - Medication not assigned to a protocol, review manually.      Passed - Valid encounter within last 12 months    Recent Outpatient Visits           4 months ago Vitamin D deficiency   [place] Elsie Stain, [place]   6 months ago Type 2 diabetes mellitus with other specified complication, without long-term current use of insulin [facility]   [place] Elsie Stain, MD   9 months ago Vitamin D deficiency   [place], Cari S, [place]   11 months ago Cervical spinal stenosis   [place], [place]   1 year ago Generalized anxiety disorder with panic attacks   [place], [place], [place]

## 2020-11-19 ENCOUNTER — Other Ambulatory Visit: Payer: Self-pay

## 2020-11-19 ENCOUNTER — Encounter: Payer: Medicare Other | Attending: Surgery | Admitting: Skilled Nursing Facility1

## 2020-11-19 DIAGNOSIS — Z713 Dietary counseling and surveillance: Secondary | ICD-10-CM | POA: Diagnosis not present

## 2020-11-19 DIAGNOSIS — Z6836 Body mass index (BMI) 36.0-36.9, adult: Secondary | ICD-10-CM | POA: Insufficient documentation

## 2020-11-19 DIAGNOSIS — E669 Obesity, unspecified: Secondary | ICD-10-CM | POA: Insufficient documentation

## 2020-11-19 NOTE — Progress Notes (Signed)
Pre-Operative Nutrition Class:    Patient was seen on 11/19/2020 for Pre-Operative Bariatric Surgery Education [facility]    Surgery date: 12/24/2020 Surgery type: sleeve Start weight at [facility]: 242.6 Weight today: 259.5  Samples given per MNT protocol. Patient educated on appropriate usage: Bariatric Advantage Multivitamin Lot # [id number] Exp: 08/23   Procare Calcium  Lot # [id number] Exp: 03/23   Bariatric Advantage protein powder Lot # [id number] Exp: 10/23  The following the learning objectives were met by the patient during this course: Identify Pre-Op Dietary Goals and will begin 2 weeks pre-operatively Identify appropriate sources of fluids and proteins  State protein recommendations and appropriate sources pre and post-operatively Identify Post-Operative Dietary Goals and will follow for 2 weeks post-operatively Identify appropriate multivitamin and calcium sources Describe the need for physical activity post-operatively and will follow MD recommendations State when to call healthcare provider regarding medication questions or post-operative complications When having a diagnosis of diabetes understanding hypoglycemia symptoms and the inclusion of 1 complex carbohydrate per meal  Handouts given during class include: Pre-Op Bariatric Surgery Diet Handout Protein Shake Handout Post-Op Bariatric Surgery Nutrition Handout BELT Program Information Flyer Support Group Information Flyer [facility] Bariatric Supplements Price List  Follow-Up Plan: Patient will follow-up at [facility] 2 weeks post operatively for diet advancement per MD.

## 2020-11-27 ENCOUNTER — Ambulatory Visit: Payer: Self-pay | Admitting: Surgery

## 2020-11-27 NOTE — H&P (Signed)
Surgical Evaluation   Chief Complaint: obesity  HPI: Returns for follow-up regarding surgical management of morbid obesity.  Denies any changes in her health since our initial meeting.  She has completed the pathway with no barriers identified.  Has several insightful questions to discuss today.   CXR/UGI: negative, no HH Dietitian: [name]  Initial consult 02/08/20: This is a 45 year old woman with multiple medical problems who presents for consultation regarding surgical treatment of morbid obesity. She has been struggling with this for about 15 years.  Her weight has steadily been increasing, she feels she has no control of her hunger and does not experience satiety.  She has tried numerous diets, worked with a nutritionist, and has tried medication for weight loss all without significant improvement.  She notes that this is affecting her energy level, she has developed diabetes, and is interested in surgical treatment in order to improve the management of her obesity and associated comorbidities.  Her mother passed away at the age of 60 from complications of obesity.  She currently works as a Tour manager.  She lives with her sister who is disabled and who she cares for her, as well as her son.  She has a very supportive fianc and good network of supportive friends, many of whom have had bariatric surgery and are doing well.  She had an upper endoscopy with Dr. Loletha Carrow in September 2020 which was completely negative, as well as a colonoscopy with removal of two 4 mm polyps in the sigmoid and transverse colon, noted diverticulosis and internal hemorrhoids.  She has a history of being treated for H. pylori and had a subsequent breath test which was negative earlier this year.  Reports previous cholecystectomy and hysterectomy, as well as panniculectomy, no other abdominal surgeries.  States the only medication she takes daily her metformin and gabapentin.  238.1lb/ BMI  40.9  MORBID OBESITY (E66.01) Story: She is a candidate for sleeve gastrectomy. She has done a fair amount of research on this. We discussed the surgery including technical aspects, the risks of bleeding, infection, pain, scarring, injury to intra-abdominal structures, staple line leak or abscess, chronic abdominal pain or nausea, new onset or worsened GERD, DVT/PE, pneumonia, heart attack, stroke, death, failure to reach weight loss goals and weight regain, hernia. Discussed the typical pre-, peri-, and postoperative course. Discussed the importance of lifelong behavioral changes to combat the chronic and relapsing disease which is obesity. I asked her to pay attention to her sensations of hunger and start to notice whether some of this is "head hunger" versus actual physiologic hunger. Discussed that surgery will not make any easier to change her habits or thought patterns that she needs to begin working on this Love (E11.40) Story: Metformin, gabapentin  Allergies  Allergen Reactions   Asa [Aspirin] Anaphylaxis    Hives, chest tightness    Mushroom Extract Complex Anaphylaxis, Swelling and Other (See Comments)    Reaction:  Eye swelling   Penicillins Anaphylaxis and Other (See Comments)    Has patient had a PCN reaction causing immediate rash, facial/tongue/throat swelling, SOB or lightheadedness with hypotension: Yes Has patient had a PCN reaction causing severe rash involving mucus membranes or skin necrosis: No Has patient had a PCN reaction that required hospitalization No Has patient had a PCN reaction occurring within the last 10 years: No If all of the above answers are "NO", then may proceed with Cephalosporin use.   Shellfish Allergy Anaphylaxis   Triamcinolone Other (  See Comments)    Skin issues     Past Medical History:  Diagnosis Date   Abdominal pain 04/26/2013   Abnormal uterine bleeding (AUB) 10/11/2012   Acute bronchitis    Allergy    Anal  pain    chronic   Anemia    Anxiety    Asthma    exacerbation 02-28-2014 and 02-23-2014 secondary to Rhinovirus   Atypical chest pain 04/26/2013   Benign neoplasm of sigmoid colon    Benign neoplasm of transverse colon    Carbuncle of labium 07/12/2015   Chest pain 02/20/2014   Chronic diarrhea    Chronic headaches    Chronic low back pain    Cigarette nicotine dependence without complication 5/70/1779   Cyst of right ovary    Dandruff 03/26/2015   Depression 03/27/2014   Diabetes mellitus without complication ([place]) 3/90/3009   Difficult intravenous access    PER PT NEEDS PICC LINE   Dyspnea 09/07/2012   [name] 08/2012:  No obstruction by FEV1%, but probable restriction.     Falls 03/27/2014   Food allergy    Mushrooms, shellfish   GAD (generalized anxiety disorder) 05/04/2014   Gait disturbance 04/11/2014   Gait instability    GERD (gastroesophageal reflux disease)    History of adenomatous polyp of colon    History of cardiac arrest    during SVD 1992   History of ectopic pregnancy    2009-  S/P LEFT SALPINGECTOMY   History of panic attacks    Hyperlipidemia    IBS (irritable bowel syndrome)    Insomnia 03/06/2014   Joint pain    Lower extremity edema    Lumbar stenosis L4 -- L5 with bulging disk   w/ right leg weakness/ decreased mobility   Migraine variant with headache 05/09/2014   Mild obstructive sleep apnea    study 03-20-2014  no cpap recommended   Neuromuscular disorder (HCC)    neuropathy in feet    Neuropathic pain of both legs 06/04/2016   Obesity (BMI 30-39.9) 03/05/2018   OSA (obstructive sleep apnea) 03/06/2014   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   Panniculitis 03/05/2018   Pelvic pain 07/25/2013   Persistent vomiting 04/27/2013   PTSD (post-traumatic stress disorder) 05/04/2014   Rash and nonspecific skin eruption 07/12/2015   Rectal bleeding 07/25/2013   RLQ abdominal pain    S/P Total vaginal hysterectomy on 01/06/13 01/06/2013   Severe recurrent major  depressive disorder with psychotic features ([place]) 05/04/2014   Sleep apnea    mild no cpap   Social anxiety disorder 05/04/2014   Sore throat 02/20/2014   Stomach ulcer    Tachycardia 02/20/2014   Type 2 diabetes mellitus (HCC)    Weakness of right leg    FROM BACK PROBLEM PER PT    Past Surgical History:  Procedure Laterality Date   ABDOMINAL HYSTERECTOMY     COLONOSCOPY Left 04/29/2013   Procedure: COLONOSCOPY;  Surgeon: Arta Silence, MD;  Location: [facility];  Service: Endoscopy;  Laterality: Left;   COLONOSCOPY     COLONOSCOPY WITH PROPOFOL N/A 08/01/2016   Procedure: COLONOSCOPY WITH PROPOFOL;  Surgeon: Doran Stabler, MD;  Location: [facility];  Service: Gastroenterology;  Laterality: N/A;   ESOPHAGOGASTRODUODENOSCOPY (EGD) WITH PROPOFOL N/A 11/10/2018   Procedure: ESOPHAGOGASTRODUODENOSCOPY (EGD) WITH PROPOFOL;  Surgeon: Doran Stabler, MD;  Location: [facility];  Service: Gastroenterology;  Laterality: N/A;   EVALUATION UNDER ANESTHESIA WITH FISTULECTOMY N/A 04/20/2014   Procedure: EXAM UNDER  ANESTHESIA ;  Surgeon: Leighton Ruff, MD;  Location: [facility];  Service: General;  Laterality: N/A;   FLEXIBLE SIGMOIDOSCOPY N/A 11/09/2013   Procedure: FLEXIBLE SIGMOIDOSCOPY;  Surgeon: Arta Silence, MD;  Location: [facility];  Service: Endoscopy;  Laterality: N/A;   fupa removal      LAPAROSCOPIC CHOLECYSTECTOMY  2005   REFRACTIVE SURGERY     SPHINCTEROTOMY N/A 04/20/2014   Procedure:  LATERAL INTERNAL SPHINCTEROTOMY;  Surgeon: Leighton Ruff, MD;  Location: [facility];  Service: General;  Laterality: N/A;   TRANSTHORACIC ECHOCARDIOGRAM  12-30-2012   mild LVH/  ef 55-60%   UNILATERAL SALPINGECTOMY  2009   laparotomy left salpingectomy-- ectopic preg.   UPPER GASTROINTESTINAL ENDOSCOPY     VAGINAL HYSTERECTOMY N/A 01/06/2013   Procedure: HYSTERECTOMY VAGINAL;  Surgeon: Osborne Oman, MD;  Location: [facility];  Service: Gynecology;  Laterality:  N/A;    Family History  Problem Relation Age of Onset   Hypertension Mother    Diabetes Mother    Allergies Mother    Heart disease Mother    Clotting disorder Mother    Stroke Mother    Kidney disease Mother    Thyroid disease Mother    Cancer Father    Hyperlipidemia Father    Hypertension Father    Colon cancer Father    Liver disease Father    Heart disease Maternal Grandmother    Breast cancer Maternal Grandmother 83   Schizophrenia Sister    Bipolar disorder Sister    Clotting disorder Sister    Bipolar disorder Brother    Kidney disease Brother    Bipolar disorder Sister    Pancreatic cancer Maternal Aunt    Prostate cancer Maternal Uncle    Liver cancer Maternal Grandfather    Rectal cancer Maternal Grandfather    Liver cancer Paternal Grandfather    Colon polyps Neg Hx    Esophageal cancer Neg Hx    Stomach cancer Neg Hx     Social History   Socioeconomic History   Marital status: Widowed    Spouse name: Not on file   Number of children: 1   Years of education: Not on file   Highest education level: Not on file  Occupational History   Occupation: disability  Tobacco Use   Smoking status: Former    Packs/day: 0.20    Years: 11.00    Pack years: 2.20    Types: Cigarettes    Quit date: 11/17/2013    Years since quitting: 7.0   Smokeless tobacco: Never   Tobacco comments:    2 years quit  Vaping Use   Vaping Use: Never used  Substance and Sexual Activity   Alcohol use: No    Alcohol/week: 0.0 standard drinks   Drug use: No   Sexual activity: Yes    Birth control/protection: Surgical  Other Topics Concern   Not on file  Social History Narrative   Lives with sister   Drinks no caffeine   Social Determinants of Health   Financial Resource Strain: Not on file  Food Insecurity: Not on file  Transportation Needs: Not on file  Physical Activity: Not on file  Stress: Not on file  Social Connections: Not on file    Current Outpatient  Medications on File Prior to Visit  Medication Sig Dispense Refill   albuterol (PROVENTIL HFA) 108 (90 Base) MCG/ACT inhaler Inhale 2 puffs into the lungs every 6 (six) hours as needed for wheezing or shortness  of breath. 6.7 g 2   atorvastatin (LIPITOR) 10 MG tablet Take 1 tablet (10 mg total) by mouth daily. 90 tablet 3   dicyclomine (BENTYL) 10 MG capsule Take 1 capsule (10 mg total) by mouth 2 (two) times daily. 60 capsule 1   EPINEPHrine 0.3 mg/0.3 mL IJ SOAJ injection SMARTSIG:1 Pre-Filled Pen Syringe IM PRN     gabapentin (NEURONTIN) 300 MG capsule Take 2 capsules (600 mg) three times per day 540 capsule 1   ibuprofen (ADVIL) 600 MG tablet Take 1 tablet (600 mg total) by mouth every 8 (eight) hours as needed. 30 tablet 0   loratadine (CLARITIN) 10 MG tablet Take 1 tablet (10 mg total) by mouth daily. 30 tablet 4   metFORMIN (GLUCOPHAGE) 500 MG tablet Take 1 tablet (500 mg total) by mouth 2 (two) times daily with a meal. 60 tablet 5   montelukast (SINGULAIR) 10 MG tablet Take 1 tablet (10 mg total) by mouth daily. 60 tablet 2   Olopatadine HCl (PATADAY) 0.2 % SOLN Two drops each eye twice daily 2.5 mL 0   pantoprazole (PROTONIX) 40 MG tablet Take 1 tablet (40 mg total) by mouth daily. 30 tablet 3   RYBELSUS 7 MG TABS Take 1 tablet by mouth once daily 30 tablet 0   traMADol (ULTRAM) 50 MG tablet Take 1 tablet (50 mg total) by mouth every 6 (six) hours as needed. 30 tablet 0   Vitamin D, Ergocalciferol, (DRISDOL) 1.25 MG (50000 UNIT) CAPS capsule Take 1 capsule (50,000 Units total) by mouth once a week. 4 capsule 2   No current facility-administered medications on file prior to visit.    Review of Systems: a complete, 10pt review of systems was completed with pertinent positives and negatives as documented in the HPI  Physical Exam: A&O x 3, Unlabored respirations   CBC Latest Ref Rng & Units 03/02/2020 02/02/2020 11/25/2019  WBC 4.0 - 10.5 K/uL 7.7 7.8 5.3  Hemoglobin 12.0 - 15.0 g/dL  15.1(H) 13.7 13.9  Hematocrit 36.0 - 46.0 % 43.4 41.0 42.1  Platelets 150 - 400 K/uL 366 361 381    CMP Latest Ref Rng & Units 03/02/2020 02/02/2020 11/25/2019  Glucose 70 - 99 mg/dL 95 131(H) 84  BUN 6 - 20 mg/dL 7 15 10   Creatinine 0.44 - 1.00 mg/dL 0.79 0.61 0.70  Sodium 135 - 145 mmol/L 137 139 143  Potassium 3.5 - 5.1 mmol/L 3.4(L) 3.4(L) 5.4(H)  Chloride 98 - 111 mmol/L 101 104 108(H)  CO2 22 - 32 mmol/L 19(L) 22 21  Calcium 8.9 - 10.3 mg/dL 8.4(L) 8.3(L) 9.7  Total Protein 6.5 - 8.1 g/dL 7.3 - 7.7  Total Bilirubin 0.3 - 1.2 mg/dL 1.4(H) - <0.2  Alkaline Phos 38 - 126 U/L 56 - 67  AST 15 - 41 U/L 39 - 15  ALT 0 - 44 U/L 37 - 27    Lab Results  Component Value Date   INR 1.0 12/18/2018   INR 0.85 06/14/2012    Imaging: No results found.   A/P:  MORBID OBESITY (E66.01) Story: She is a candidate for sleeve gastrectomy. She has done a fair amount of research on this. We discussed the surgery including technical aspects, the risks of bleeding, infection, pain, scarring, injury to intra-abdominal structures, staple line leak or abscess, chronic abdominal pain or nausea, new onset or worsened GERD, DVT/PE, pneumonia, heart attack, stroke, death, failure to reach weight loss goals and weight regain, hernia. Discussed the typical pre-, peri-, and  postoperative course. Discussed the importance of lifelong behavioral changes to combat the chronic and relapsing disease which is obesity. I asked her to pay attention to her sensations of hunger and start to notice whether some of this is "head hunger" versus actual physiologic hunger. Discussed that surgery will not make any easier to change her habits or thought patterns that she needs to begin working on this. She has completed the pathway and is approved for sleeve gastrectomy. Will plan to proceed as scheduled next month. 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 12/13/2019   Vitamin D deficiency 04/05/2019   Class 2 severe obesity with serious comorbidity and body mass index (BMI) of 36.0 to 36.9 in adult ([place]) 04/05/2019   Obesity (BMI 30-39.9) 03/05/2018   Chronic diarrhea    Benign neoplasm of transverse colon    Benign neoplasm of sigmoid colon    Neuropathic pain of both legs 06/04/2016   Rash and nonspecific skin eruption 07/12/2015   Dandruff 03/26/2015   Migraine variant with headache 05/09/2014   Generalized anxiety disorder 05/04/2014   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

## 2020-11-27 NOTE — H&P (View-Only) (Signed)
Surgical Evaluation   Chief Complaint: obesity  HPI: Returns for follow-up regarding surgical management of morbid obesity.  Denies any changes in her health since our initial meeting.  She has completed the pathway with no barriers identified.  Has several insightful questions to discuss today.   CXR/UGI: negative, no HH Dietitian: Marisa [name]  Initial consult 02/08/20: This is a 45 year old woman with multiple medical problems who presents for consultation regarding surgical treatment of morbid obesity. She has been struggling with this for about 15 years.  Her weight has steadily been increasing, she feels she has no control of her hunger and does not experience satiety.  She has tried numerous diets, worked with a nutritionist, and has tried medication for weight loss all without significant improvement.  She notes that this is affecting her energy level, she has developed diabetes, and is interested in surgical treatment in order to improve the management of her obesity and associated comorbidities.  Her mother passed away at the age of 51 from complications of obesity.  She currently works as a Tour manager.  She lives with her sister who is disabled and who she cares for her, as well as her son.  She has a very supportive fianc and good network of supportive friends, many of whom have had bariatric surgery and are doing well.  She had an upper endoscopy with Dr. Loletha [name] in September 2020 which was completely negative, as well as a colonoscopy with removal of two 4 mm polyps in the sigmoid and transverse colon, noted diverticulosis and internal hemorrhoids.  She has a history of being treated for H. pylori and had a subsequent breath test which was negative earlier this year.  Reports previous cholecystectomy and hysterectomy, as well as panniculectomy, no other abdominal surgeries.  States the only medication she takes daily her metformin and gabapentin.  238.1lb/ BMI  40.9  MORBID OBESITY (E66.01) Story: She is a candidate for sleeve gastrectomy. She has done a fair amount of research on this. We discussed the surgery including technical aspects, the risks of bleeding, infection, pain, scarring, injury to intra-abdominal structures, staple line leak or abscess, chronic abdominal pain or nausea, new onset or worsened GERD, DVT/PE, pneumonia, heart attack, stroke, death, failure to reach weight loss goals and weight regain, hernia. Discussed the typical pre-, peri-, and postoperative course. Discussed the importance of lifelong behavioral changes to combat the chronic and relapsing disease which is obesity. I asked her to pay attention to her sensations of hunger and start to notice whether some of this is "head hunger" versus actual physiologic hunger. Discussed that surgery will not make any easier to change her habits or thought patterns that she needs to begin working on this Marisa [name] (E11.40) Story: Metformin, gabapentin  Allergies  Allergen Reactions   Asa [Aspirin] Anaphylaxis    Hives, chest tightness    Mushroom Extract Complex Anaphylaxis, Swelling and Other (See Comments)    Reaction:  Eye swelling   Penicillins Anaphylaxis and Other (See Comments)    Has patient had a PCN reaction causing immediate rash, facial/tongue/throat swelling, SOB or lightheadedness with hypotension: Yes Has patient had a PCN reaction causing severe rash involving mucus membranes or skin necrosis: No Has patient had a PCN reaction that required hospitalization No Has patient had a PCN reaction occurring within the last 10 years: No If all of the above answers are "NO", then may proceed with Cephalosporin use.   Shellfish Allergy Anaphylaxis   Triamcinolone Other (  See Comments)    Skin issues     Past Medical History:  Diagnosis Date   Abdominal pain 04/26/2013   Abnormal uterine bleeding (AUB) 10/11/2012   Acute bronchitis    Allergy    Anal  pain    chronic   Anemia    Anxiety    Asthma    exacerbation 02-28-2014 and 02-23-2014 secondary to Rhinovirus   Atypical chest pain 04/26/2013   Benign neoplasm of sigmoid colon    Benign neoplasm of transverse colon    Carbuncle of labium 07/12/2015   Chest pain 02/20/2014   Chronic diarrhea    Chronic headaches    Chronic low back pain    Cigarette nicotine dependence without complication 0/62/3762   Cyst of right ovary    Dandruff 03/26/2015   Depression 03/27/2014   Diabetes mellitus without complication ([place]) 10/18/5174   Difficult intravenous access    PER PT NEEDS PICC LINE   Dyspnea 09/07/2012   Marisa [name] 08/2012:  No obstruction by FEV1%, but probable restriction.     Falls 03/27/2014   Food allergy    Mushrooms, shellfish   GAD (generalized anxiety disorder) 05/04/2014   Gait disturbance 04/11/2014   Gait instability    GERD (gastroesophageal reflux disease)    History of adenomatous polyp of colon    History of cardiac arrest    during SVD 1992   History of ectopic pregnancy    2009-  S/P LEFT SALPINGECTOMY   History of panic attacks    Hyperlipidemia    IBS (irritable bowel syndrome)    Insomnia 03/06/2014   Joint pain    Lower extremity edema    Lumbar stenosis L4 -- L5 with bulging disk   w/ right leg weakness/ decreased mobility   Migraine variant with headache 05/09/2014   Mild obstructive sleep apnea    study 03-20-2014  no cpap recommended   Neuromuscular disorder (HCC)    neuropathy in feet    Neuropathic pain of both legs 06/04/2016   Obesity (BMI 30-39.9) 03/05/2018   OSA (obstructive sleep apnea) 03/06/2014   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   Panniculitis 03/05/2018   Pelvic pain 07/25/2013   Persistent vomiting 04/27/2013   PTSD (post-traumatic stress disorder) 05/04/2014   Rash and nonspecific skin eruption 07/12/2015   Rectal bleeding 07/25/2013   RLQ abdominal pain    S/P Total vaginal hysterectomy on 01/06/13 01/06/2013   Severe recurrent major  depressive disorder with psychotic features ([place]) 05/04/2014   Sleep apnea    mild no cpap   Social anxiety disorder 05/04/2014   Sore throat 02/20/2014   Stomach ulcer    Tachycardia 02/20/2014   Type 2 diabetes mellitus (HCC)    Weakness of right leg    FROM BACK PROBLEM PER PT    Past Surgical History:  Procedure Laterality Date   ABDOMINAL HYSTERECTOMY     COLONOSCOPY Left 04/29/2013   Procedure: COLONOSCOPY;  Surgeon: Arta Silence, MD;  Location: [facility];  Service: Endoscopy;  Laterality: Left;   COLONOSCOPY     COLONOSCOPY WITH PROPOFOL N/A 08/01/2016   Procedure: COLONOSCOPY WITH PROPOFOL;  Surgeon: Doran Stabler, MD;  Location: [facility];  Service: Gastroenterology;  Laterality: N/A;   ESOPHAGOGASTRODUODENOSCOPY (EGD) WITH PROPOFOL N/A 11/10/2018   Procedure: ESOPHAGOGASTRODUODENOSCOPY (EGD) WITH PROPOFOL;  Surgeon: Doran Stabler, MD;  Location: [facility];  Service: Gastroenterology;  Laterality: N/A;   EVALUATION UNDER ANESTHESIA WITH FISTULECTOMY N/A 04/20/2014   Procedure: EXAM UNDER  ANESTHESIA ;  Surgeon: Leighton Ruff, MD;  Location: [facility];  Service: General;  Laterality: N/A;   FLEXIBLE SIGMOIDOSCOPY N/A 11/09/2013   Procedure: FLEXIBLE SIGMOIDOSCOPY;  Surgeon: Arta Silence, MD;  Location: [facility];  Service: Endoscopy;  Laterality: N/A;   fupa removal      LAPAROSCOPIC CHOLECYSTECTOMY  2005   REFRACTIVE SURGERY     SPHINCTEROTOMY N/A 04/20/2014   Procedure:  LATERAL INTERNAL SPHINCTEROTOMY;  Surgeon: Leighton Ruff, MD;  Location[facility];  Service: General;  Laterality: N/A;   TRANSTHORACIC ECHOCARDIOGRAM  12-30-2012   mild LVH/  ef 55-60%   UNILATERAL SALPINGECTOMY  2009   laparotomy left salpingectomy-- ectopic preg.   UPPER GASTROINTESTINAL ENDOSCOPY     VAGINAL HYSTERECTOMY N/A 01/06/2013   Procedure: HYSTERECTOMY VAGINAL;  Surgeon: Osborne Oman, MD;  Location: [facility];  Service: Gynecology;  Laterality:  N/A;    Family History  Problem Relation Age of Onset   Hypertension Mother    Diabetes Mother    Allergies Mother    Heart disease Mother    Clotting disorder Mother    Stroke Mother    Kidney disease Mother    Thyroid disease Mother    Cancer Father    Hyperlipidemia Father    Hypertension Father    Colon cancer Father    Liver disease Father    Heart disease Maternal Grandmother    Breast cancer Maternal Grandmother 64   Schizophrenia Sister    Bipolar disorder Sister    Clotting disorder Sister    Bipolar disorder Brother    Kidney disease Brother    Bipolar disorder Sister    Pancreatic cancer Maternal Aunt    Prostate cancer Maternal Uncle    Liver cancer Maternal Grandfather    Rectal cancer Maternal Grandfather    Liver cancer Paternal Grandfather    Colon polyps Neg Hx    Esophageal cancer Neg Hx    Stomach cancer Neg Hx     Social History   Socioeconomic History   Marital status: Widowed    Spouse name: Not on file   Number of children: 1   Years of education: Not on file   Highest education level: Not on file  Occupational History   Occupation: disability  Tobacco Use   Smoking status: Former    Packs/day: 0.20    Years: 11.00    Pack years: 2.20    Types: Cigarettes    Quit date: 11/17/2013    Years since quitting: 7.0   Smokeless tobacco: Never   Tobacco comments:    2 years quit  Vaping Use   Vaping Use: Never used  Substance and Sexual Activity   Alcohol use: No    Alcohol/week: 0.0 standard drinks   Drug use: No   Sexual activity: Yes    Birth control/protection: Surgical  Other Topics Concern   Not on file  Social History Narrative   Lives with sister   Drinks no caffeine   Social Determinants of Health   Financial Resource Strain: Not on file  Food Insecurity: Not on file  Transportation Needs: Not on file  Physical Activity: Not on file  Stress: Not on file  Social Connections: Not on file    Current Outpatient  Medications on File Prior to Visit  Medication Sig Dispense Refill   albuterol (PROVENTIL HFA) 108 (90 Base) MCG/ACT inhaler Inhale 2 puffs into the lungs every 6 (six) hours as needed for wheezing or shortness  of breath. 6.7 g 2   atorvastatin (LIPITOR) 10 MG tablet Take 1 tablet (10 mg total) by mouth daily. 90 tablet 3   dicyclomine (BENTYL) 10 MG capsule Take 1 capsule (10 mg total) by mouth 2 (two) times daily. 60 capsule 1   EPINEPHrine 0.3 mg/0.3 mL IJ SOAJ injection SMARTSIG:1 Pre-Filled Pen Syringe IM PRN     gabapentin (NEURONTIN) 300 MG capsule Take 2 capsules (600 mg) three times per day 540 capsule 1   ibuprofen (ADVIL) 600 MG tablet Take 1 tablet (600 mg total) by mouth every 8 (eight) hours as needed. 30 tablet 0   loratadine (CLARITIN) 10 MG tablet Take 1 tablet (10 mg total) by mouth daily. 30 tablet 4   metFORMIN (GLUCOPHAGE) 500 MG tablet Take 1 tablet (500 mg total) by mouth 2 (two) times daily with a meal. 60 tablet 5   montelukast (SINGULAIR) 10 MG tablet Take 1 tablet (10 mg total) by mouth daily. 60 tablet 2   Olopatadine HCl (PATADAY) 0.2 % SOLN Two drops each eye twice daily 2.5 mL 0   pantoprazole (PROTONIX) 40 MG tablet Take 1 tablet (40 mg total) by mouth daily. 30 tablet 3   RYBELSUS 7 MG TABS Take 1 tablet by mouth once daily 30 tablet 0   traMADol (ULTRAM) 50 MG tablet Take 1 tablet (50 mg total) by mouth every 6 (six) hours as needed. 30 tablet 0   Vitamin D, Ergocalciferol, (DRISDOL) 1.25 MG (50000 UNIT) CAPS capsule Take 1 capsule (50,000 Units total) by mouth once a week. 4 capsule 2   No current facility-administered medications on file prior to visit.    Review of Systems: a complete, 10pt review of systems was completed with pertinent positives and negatives as documented in the HPI  Physical Exam: A&O x 3, Unlabored respirations   CBC Latest Ref Rng & Units 03/02/2020 02/02/2020 11/25/2019  WBC 4.0 - 10.5 K/uL 7.7 7.8 5.3  Hemoglobin 12.0 - 15.0 g/dL  15.1(H) 13.7 13.9  Hematocrit 36.0 - 46.0 % 43.4 41.0 42.1  Platelets 150 - 400 K/uL 366 361 381    CMP Latest Ref Rng & Units 03/02/2020 02/02/2020 11/25/2019  Glucose 70 - 99 mg/dL 95 131(H) 84  BUN 6 - 20 mg/dL 7 15 10   Creatinine 0.44 - 1.00 mg/dL 0.79 0.61 0.70  Sodium 135 - 145 mmol/L 137 139 143  Potassium 3.5 - 5.1 mmol/L 3.4(L) 3.4(L) 5.4(H)  Chloride 98 - 111 mmol/L 101 104 108(H)  CO2 22 - 32 mmol/L 19(L) 22 21  Calcium 8.9 - 10.3 mg/dL 8.4(L) 8.3(L) 9.7  Total Protein 6.5 - 8.1 g/dL 7.3 - 7.7  Total Bilirubin 0.3 - 1.2 mg/dL 1.4(H) - <0.2  Alkaline Phos 38 - 126 U/L 56 - 67  AST 15 - 41 U/L 39 - 15  ALT 0 - 44 U/L 37 - 27    Lab Results  Component Value Date   INR 1.0 12/18/2018   INR 0.85 06/14/2012    Imaging: No results found.   A/P:  MORBID OBESITY (E66.01) Story: She is a candidate for sleeve gastrectomy. She has done a fair amount of research on this. We discussed the surgery including technical aspects, the risks of bleeding, infection, pain, scarring, injury to intra-abdominal structures, staple line leak or abscess, chronic abdominal pain or nausea, new onset or worsened GERD, DVT/PE, pneumonia, heart attack, stroke, death, failure to reach weight loss goals and weight regain, hernia. Discussed the typical pre-, peri-, and  postoperative course. Discussed the importance of lifelong behavioral changes to combat the chronic and relapsing disease which is obesity. I asked her to pay attention to her sensations of hunger and start to notice whether some of this is "head hunger" versus actual physiologic hunger. Discussed that surgery will not make any easier to change her habits or thought patterns that she needs to begin working on this. She has completed the pathway and is approved for sleeve gastrectomy. Will plan to proceed as scheduled next month. 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 12/13/2019   Vitamin D deficiency 04/05/2019   Class 2 severe obesity with serious comorbidity and body mass index (BMI) of 36.0 to 36.9 in adult ([place]) 04/05/2019   Obesity (BMI 30-39.9) 03/05/2018   Chronic diarrhea    Benign neoplasm of transverse colon    Benign neoplasm of sigmoid colon    Neuropathic pain of both legs 06/04/2016   Rash and nonspecific skin eruption 07/12/2015   Dandruff 03/26/2015   Migraine variant with headache 05/09/2014   Generalized anxiety disorder 05/04/2014   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

## 2020-12-17 NOTE — Patient Instructions (Addendum)
DUE TO COVID-19 ONLY ONE VISITOR IS ALLOWED TO COME WITH YOU AND STAY IN THE WAITING ROOM ONLY DURING PRE OP AND PROCEDURE DAY OF SURGERY.   Up to two visitors ages 16+ are allowed at one time in a patient's room.  The visitors may rotate out with other people throughout the day.  Additionally, up to two children between the ages of 12 and 87 are allowed and do not count toward the number of allowed visitors.  Children within this age range must be accompanied by an adult visitor.  One adult visitor may remain with the patient overnight and must be in the room by 8 PM.  YOU NEED TO HAVE A COVID 19 TEST ON__11-4-22_____ between 8am-3pm______, THIS TEST MUST BE DONE BEFORE SURGERY,     Please bring completed form with you to the COVID testing site   COVID TESTING SITE [place] TEST IS COMPLETED,  PLEASE Wear a mask when in public           Your procedure is scheduled on: 12-24-20   Report to [facility] Main  Entrance   Report to admitting at       [place]  AM     Call this number if you have problems the morning of surgery [phone]   Remember: MORNING OF SURGERY DRINK:   DRINK 1 G2 drink BEFORE YOU LEAVE HOME, DRINK ALL OF THE  G2 DRINK AT ONE TIME.   NO SOLID FOOD AFTER 600 PM THE NIGHT BEFORE YOUR SURGERY.   YOU MAY DRINK CLEAR FLUIDS. THE G2 DRINK YOU DRINK BEFORE YOU LEAVE HOME WILL BE THE LAST FLUIDS YOU DRINK BEFORE SURGERY.  PAIN IS EXPECTED AFTER SURGERY AND WILL NOT BE COMPLETELY ELIMINATED. AMBULATION AND TYLENOL WILL HELP REDUCE INCISIONAL AND GAS PAIN. MOVEMENT IS KEY!  YOU ARE EXPECTED TO BE OUT OF BED WITHIN 4 HOURS OF ADMISSION TO YOUR PATIENT ROOM.  SITTING IN THE RECLINER THROUGHOUT THE DAY IS IMPORTANT FOR DRINKING FLUIDS AND MOVING GAS THROUGHOUT THE GI TRACT.  COMPRESSION STOCKINGS SHOULD BE WORN [place] UNLESS YOU ARE WALKING.   INCENTIVE SPIROMETER SHOULD BE USED EVERY  HOUR WHILE AWAKE TO DECREASE POST-OPERATIVE COMPLICATIONS SUCH AS PNEUMONIA.  WHEN DISCHARGED HOME, IT IS IMPORTANT TO CONTINUE TO WALK EVERY HOUR AND USE THE INCENTIVE SPIROMETER EVERY HOUR.      CLEAR LIQUID DIET                                                                    water Black Coffee and tea, regular and decaf No Creamer                            Plain Jell-O any favor except red or purple                                  Fruit ices (not with fruit pulp)  Iced Popsicles                                                                     Cranberry, grape and apple juices Sports drinks like Gatorade Lightly seasoned clear broth or consume(fat free) Sugar, honey syrup  Sample Menu Breakfast                                Lunch                                     Supper Cranberry juice                    Beef broth                            Chicken broth Jell-O                                     Grape juice                           Apple juice Coffee or tea                        Jell-O                                      Popsicle                                                Coffee or tea                        Coffee or tea  _____________________________________________________________________           BRUSH YOUR TEETH MORNING OF SURGERY AND RINSE YOUR MOUTH OUT, NO CHEWING GUM CANDY OR MINTS.     Take these medicines the morning of surgery with A SIP OF WATER: pantoprazole(protonix), inhaler and bring inhaler with you  DO NOT TAKE ANY DIABETIC MEDICATIONS DAY OF YOUR SURGERY                               You may not have any metal on your body including hair pins and              piercings  Do not wear jewelry, make-up, lotions, powders,perfumes,        deodorant             Do not wear nail polish on your fingernails or toenails .  Do not shave  48 hours prior to surgery.  Do not bring valuables to the  hospital. [place].  Contacts, dentures or bridgework may not be worn into surgery.  You may bring a small overnight bag with you     Patients discharged the day of surgery will not be allowed to drive home. IF YOU ARE HAVING SURGERY AND GOING HOME THE SAME DAY, YOU MUST HAVE AN ADULT TO DRIVE YOU HOME AND BE WITH YOU FOR 24 HOURS. YOU MAY GO HOME BY TAXI OR UBER OR ORTHERWISE, BUT AN ADULT MUST ACCOMPANY YOU HOME AND STAY WITH YOU FOR 24 HOURS.  Name and phone number of your driver:  Special Instructions: N/A              Please read over the following fact sheets you were given: _____________________________________________________________________             [facility] - Preparing for Surgery Before surgery, you can play an important role.  Because skin is not sterile, your skin needs to be as free of germs as possible.  You can reduce the number of germs on your skin by washing with CHG (chlorahexidine gluconate) soap before surgery.  CHG is an antiseptic cleaner which kills germs and bonds with the skin to continue killing germs even after washing. Please DO NOT use if you have an allergy to CHG or antibacterial soaps.  If your skin becomes reddened/irritated stop using the CHG and inform your nurse when you arrive at Short Stay. Do not shave (including legs and underarms) for at least 48 hours prior to the first CHG shower.  You may shave your face/neck. Please follow these instructions carefully:  1.  Shower with CHG Soap the night before surgery and the  morning of Surgery.  2.  If you choose to wash your hair, wash your hair first as usual with your  normal  shampoo.  3.  After you shampoo, rinse your hair and body thoroughly to remove the  shampoo.                           4.  Use CHG as you would any other liquid soap.  You can apply chg directly  to the skin and wash                       Gently with a scrungie or clean  washcloth.  5.  Apply the CHG Soap to your body ONLY FROM THE NECK DOWN.   Do not use on face/ open                           Wound or open sores. Avoid contact with eyes, ears mouth and genitals (private parts).                       Wash face,  Genitals (private parts) with your normal soap.             6.  Wash thoroughly, paying special attention to the area where your surgery  will be performed.  7.  Thoroughly rinse your body with warm water from the neck down.  8.  DO NOT shower/wash with your normal soap after using and rinsing off  the CHG Soap.  9.  Pat yourself dry with a clean towel.            10.  Wear clean pajamas.            11.  Place clean sheets on your bed the night of your first shower and do not  sleep with pets. Day of Surgery : Do not apply any lotions/deodorants the morning of surgery.  Please wear clean clothes to the hospital/surgery center.  FAILURE TO FOLLOW THESE INSTRUCTIONS MAY RESULT IN THE CANCELLATION OF YOUR SURGERY PATIENT SIGNATURE_________________________________  NURSE SIGNATURE__________________________________  ________________________________________________________________________   How to Manage Your Diabetes Before and After Surgery  Why is it important to control my blood sugar before and after surgery? Improving blood sugar levels before and after surgery helps healing and can limit problems. A way of improving blood sugar control is eating a healthy diet by:  Eating less sugar and carbohydrates  Increasing activity/exercise  Talking with your doctor about reaching your blood sugar goals High blood sugars (greater than 180 mg/dL) can raise your risk of infections and slow your recovery, so you will need to focus on controlling your diabetes during the weeks before surgery. Make sure that the doctor who takes care of your diabetes knows about your planned surgery including the date and location.  How do I manage my blood  sugar before surgery? Check your blood sugar at least 4 times a day, starting 2 days before surgery, to make sure that the level is not too high or low. Check your blood sugar the morning of your surgery when you wake up and every 2 hours until you get to the Short Stay unit. If your blood sugar is less than 70 mg/dL, you will need to treat for low blood sugar: Do not take insulin. Treat a low blood sugar (less than 70 mg/dL) with  cup of clear juice (cranberry or apple), 4 glucose tablets, OR glucose gel. Recheck blood sugar in 15 minutes after treatment (to make sure it is greater than 70 mg/dL). If your blood sugar is not greater than 70 mg/dL on recheck, call [phone] for further instructions. Report your blood sugar to the short stay nurse when you get to Short Stay.  If you are admitted to the hospital after surgery: Your blood sugar will be checked by the staff and you will probably be given insulin after surgery (instead of oral diabetes medicines) to make sure you have good blood sugar levels. The goal for blood sugar control after surgery is 80-180 mg/dL.   WHAT DO I DO ABOUT MY DIABETES MEDICATION?  NONE morning of surgery          Incentive Spirometer  An incentive spirometer is a tool that can help keep your lungs clear and active. This tool measures how well you are filling your lungs with each breath. Taking long deep breaths may help reverse or decrease the chance of developing breathing (pulmonary) problems (especially infection) following: A long period of time when you are unable to move or be active. BEFORE THE PROCEDURE  If the spirometer includes an indicator to show your best effort, your nurse or respiratory therapist will set it to a desired goal. If possible, sit up straight or lean slightly forward. Try not to slouch. Hold the incentive spirometer in an upright position. INSTRUCTIONS FOR USE  Sit on the edge of your bed if possible, or sit up as far as  you can in bed or on a chair.  Hold the incentive spirometer in an upright position. Breathe out normally. Place the mouthpiece in your mouth and seal your lips tightly around it. Breathe in slowly and as deeply as possible, raising the piston or the ball toward the top of the column. Hold your breath for 3-5 seconds or for as long as possible. Allow the piston or ball to fall to the bottom of the column. Remove the mouthpiece from your mouth and breathe out normally. Rest for a few seconds and repeat Steps 1 through 7 at least 10 times every 1-2 hours when you are awake. Take your time and take a few normal breaths between deep breaths. The spirometer may include an indicator to show your best effort. Use the indicator as a goal to work toward during each repetition. After each set of 10 deep breaths, practice coughing to be sure your lungs are clear. If you have an incision (the cut made at the time of surgery), support your incision when coughing by placing a pillow or rolled up towels firmly against it. Once you are able to get out of bed, walk around indoors and cough well. You may stop using the incentive spirometer when instructed by your caregiver.  RISKS AND COMPLICATIONS Take your time so you do not get dizzy or light-headed. If you are in pain, you may need to take or ask for pain medication before doing incentive spirometry. It is harder to take a deep breath if you are having pain. AFTER USE Rest and breathe slowly and easily. It can be helpful to keep track of a log of your progress. Your caregiver can provide you with a simple table to help with this. If you are using the spirometer at home, follow these instructions: [place] IF:  You are having difficultly using the spirometer. You have trouble using the spirometer as often as instructed. Your pain medication is not giving enough relief while using the spirometer. You develop fever of 100.5 F (38.1 C) or higher. SEEK  IMMEDIATE MEDICAL CARE IF:  You cough up bloody sputum that had not been present before. You develop fever of 102 F (38.9 C) or greater. You develop worsening pain at or near the incision site. MAKE SURE YOU:  Understand these instructions. Will watch your condition. Will get help right away if you are not doing well or get worse. Document Released: 06/16/2006 Document Revised: 04/28/2011 Document Reviewed: 08/17/2006 [facility] Patient Information 2014 [place], [place].   ________________________________________________________________________

## 2020-12-18 ENCOUNTER — Other Ambulatory Visit (HOSPITAL_COMMUNITY): Payer: Self-pay

## 2020-12-21 ENCOUNTER — Other Ambulatory Visit: Payer: Self-pay | Admitting: Surgery

## 2020-12-21 ENCOUNTER — Encounter (HOSPITAL_COMMUNITY)
Admission: RE | Admit: 2020-12-21 | Discharge: 2020-12-21 | Disposition: A | Discharge status: Home / Self Care | Payer: Medicare Other | Source: Ambulatory Visit | Attending: Surgery | Admitting: Surgery

## 2020-12-21 ENCOUNTER — Encounter (HOSPITAL_COMMUNITY): Payer: Self-pay | Admitting: Physician Assistant

## 2020-12-21 ENCOUNTER — Encounter (HOSPITAL_COMMUNITY): Payer: Self-pay

## 2020-12-21 ENCOUNTER — Other Ambulatory Visit: Payer: Self-pay

## 2020-12-21 VITALS — BP 138/97 | HR 90 | Temp 97.9°F | Resp 16 | Ht 65.0 in | Wt 258.0 lb

## 2020-12-21 DIAGNOSIS — F4310 Post-traumatic stress disorder, unspecified: Secondary | ICD-10-CM

## 2020-12-21 DIAGNOSIS — K529 Noninfective gastroenteritis and colitis, unspecified: Secondary | ICD-10-CM

## 2020-12-21 DIAGNOSIS — Z01812 Encounter for preprocedural laboratory examination: Secondary | ICD-10-CM | POA: Insufficient documentation

## 2020-12-21 DIAGNOSIS — D123 Benign neoplasm of transverse colon: Secondary | ICD-10-CM

## 2020-12-21 DIAGNOSIS — L210 Seborrhea capitis: Secondary | ICD-10-CM

## 2020-12-21 DIAGNOSIS — J45909 Unspecified asthma, uncomplicated: Secondary | ICD-10-CM | POA: Insufficient documentation

## 2020-12-21 DIAGNOSIS — G5793 Unspecified mononeuropathy of bilateral lower limbs: Secondary | ICD-10-CM

## 2020-12-21 DIAGNOSIS — F4010 Social phobia, unspecified: Secondary | ICD-10-CM

## 2020-12-21 DIAGNOSIS — F4001 Agoraphobia with panic disorder: Secondary | ICD-10-CM

## 2020-12-21 DIAGNOSIS — E1169 Type 2 diabetes mellitus with other specified complication: Secondary | ICD-10-CM

## 2020-12-21 DIAGNOSIS — G894 Chronic pain syndrome: Secondary | ICD-10-CM

## 2020-12-21 DIAGNOSIS — E559 Vitamin D deficiency, unspecified: Secondary | ICD-10-CM

## 2020-12-21 DIAGNOSIS — Z90710 Acquired absence of both cervix and uterus: Secondary | ICD-10-CM

## 2020-12-21 DIAGNOSIS — K219 Gastro-esophageal reflux disease without esophagitis: Secondary | ICD-10-CM | POA: Insufficient documentation

## 2020-12-21 DIAGNOSIS — R21 Rash and other nonspecific skin eruption: Secondary | ICD-10-CM

## 2020-12-21 DIAGNOSIS — G4733 Obstructive sleep apnea (adult) (pediatric): Secondary | ICD-10-CM | POA: Insufficient documentation

## 2020-12-21 DIAGNOSIS — E1140 Type 2 diabetes mellitus with diabetic neuropathy, unspecified: Secondary | ICD-10-CM

## 2020-12-21 DIAGNOSIS — D125 Benign neoplasm of sigmoid colon: Secondary | ICD-10-CM

## 2020-12-21 DIAGNOSIS — G43809 Other migraine, not intractable, without status migrainosus: Secondary | ICD-10-CM

## 2020-12-21 DIAGNOSIS — Z6841 Body Mass Index (BMI) 40.0 and over, adult: Secondary | ICD-10-CM | POA: Insufficient documentation

## 2020-12-21 DIAGNOSIS — Z8674 Personal history of sudden cardiac arrest: Secondary | ICD-10-CM

## 2020-12-21 DIAGNOSIS — M5021 Other cervical disc displacement,  high cervical region: Secondary | ICD-10-CM

## 2020-12-21 DIAGNOSIS — E669 Obesity, unspecified: Secondary | ICD-10-CM

## 2020-12-21 DIAGNOSIS — F411 Generalized anxiety disorder: Secondary | ICD-10-CM

## 2020-12-21 HISTORY — DX: Pneumonia, unspecified organism: J18.9

## 2020-12-21 LAB — COMPREHENSIVE METABOLIC PANEL
ALT: 129 U/L — ABNORMAL HIGH (ref 0–44)
AST: 178 U/L — ABNORMAL HIGH (ref 15–41)
Albumin: 3.9 g/dL (ref 3.5–5.0)
Alkaline Phosphatase: 77 U/L (ref 38–126)
Anion gap: 9 (ref 5–15)
BUN: 11 mg/dL (ref 6–20)
CO2: 25 mmol/L (ref 22–32)
Calcium: 9.1 mg/dL (ref 8.9–10.3)
Chloride: 102 mmol/L (ref 98–111)
Creatinine, Ser: 0.6 mg/dL (ref 0.44–1.00)
GFR, Estimated: >60 mL/min (ref >60)
Glucose, Bld: 149 mg/dL — ABNORMAL HIGH (ref 70–99)
Potassium: 4.1 mmol/L (ref 3.5–5.1)
Sodium: 136 mmol/L (ref 135–145)
Total Bilirubin: 1.1 mg/dL (ref 0.3–1.2)
Total Protein: 7.6 g/dL (ref 6.5–8.1)

## 2020-12-21 LAB — HEMOGLOBIN A1C
Hgb A1c MFr Bld: 7.5 % — ABNORMAL HIGH (ref 4.8–5.6)
Mean Plasma Glucose: 168.55 mg/dL

## 2020-12-21 LAB — CBC WITH DIFFERENTIAL/PLATELET
Abs Immature Granulocytes: 0.05 10*3/uL (ref 0.00–0.07)
Basophils Absolute: 0 10*3/uL (ref 0.0–0.1)
Basophils Relative: 1 %
Eosinophils Absolute: 0.3 10*3/uL (ref 0.0–0.5)
Eosinophils Relative: 3 %
HCT: 44.7 % (ref 36.0–46.0)
Hemoglobin: 14.7 g/dL (ref 12.0–15.0)
Immature Granulocytes: 1 %
Lymphocytes Relative: 38 %
Lymphs Abs: 3 10*3/uL (ref 0.7–4.0)
MCH: 31.2 pg (ref 26.0–34.0)
MCHC: 32.9 g/dL (ref 30.0–36.0)
MCV: 94.9 fL (ref 80.0–100.0)
Monocytes Absolute: 0.5 10*3/uL (ref 0.1–1.0)
Monocytes Relative: 6 %
Neutro Abs: 4.2 10*3/uL (ref 1.7–7.7)
Neutrophils Relative %: 51 %
Platelets: 373 10*3/uL (ref 150–400)
RBC: 4.71 MIL/uL (ref 3.87–5.11)
RDW: 12.4 % (ref 11.5–15.5)
WBC: 8 10*3/uL (ref 4.0–10.5)
nRBC: 0 % (ref 0.0–0.2)

## 2020-12-21 LAB — SARS CORONAVIRUS 2 (TAT 6-24 HRS): SARS Coronavirus 2: NEGATIVE

## 2020-12-21 LAB — GLUCOSE, CAPILLARY: Glucose-Capillary: 155 mg/dL — ABNORMAL HIGH (ref 70–99)

## 2020-12-21 NOTE — Progress Notes (Addendum)
PCP - Dr. Jodie Echevaria [facility] [place] Cardiologist - no  PPM/ICD -  Device Orders -  Rep Notified -   Chest x-ray - 03-12-20 epic EKG - 03-12-20 epic Stress Test -  ECHO -  Cardiac Cath -   Sleep Study -  CPAP - no  Fasting Blood Sugar - 98 Checks Blood Sugar ___2__ times a day  Blood Thinner Instructions: Aspirin Instructions:  ERAS Protcol - PRE-SURGERY G2-   COVID TEST- 12-21-20 COVID vaccine -yes Moderna   Activity--Able to walk a flight stairs without SOB Anesthesia review: OSA no need for CPAP, DM II, AST 178, ALT 129  Patient denies shortness of breath, fever, cough and chest pain at PAT appointment   All instructions explained to the patient, with a verbal understanding of the material. Patient agrees to go over the instructions while at home for a better understanding. Patient also instructed to self quarantine after being tested for COVID-19. The opportunity to ask questions was provided.

## 2020-12-24 ENCOUNTER — Inpatient Hospital Stay (HOSPITAL_COMMUNITY): Admission: RE | Admit: 2020-12-24 | Payer: Medicare Other | Source: Home / Self Care | Admitting: Surgery

## 2020-12-24 ENCOUNTER — Inpatient Hospital Stay (HOSPITAL_COMMUNITY)
Admission: RE | Admit: 2020-12-24 | Discharge: 2020-12-25 | DRG: 621 | Disposition: A | Discharge status: Home / Self Care | Payer: Medicare Other | Attending: Surgery | Admitting: Surgery

## 2020-12-24 ENCOUNTER — Inpatient Hospital Stay (HOSPITAL_COMMUNITY): Payer: Medicare Other | Admitting: Anesthesiology

## 2020-12-24 ENCOUNTER — Encounter (HOSPITAL_COMMUNITY): Payer: Self-pay | Admitting: Surgery

## 2020-12-24 ENCOUNTER — Encounter (HOSPITAL_COMMUNITY)
Admission: RE | Disposition: A | Discharge status: Home / Self Care | Payer: Self-pay | Source: Home / Self Care | Attending: Surgery

## 2020-12-24 ENCOUNTER — Other Ambulatory Visit (HOSPITAL_COMMUNITY): Payer: Self-pay

## 2020-12-24 ENCOUNTER — Encounter (HOSPITAL_COMMUNITY): Admission: RE | Payer: Self-pay | Source: Home / Self Care

## 2020-12-24 ENCOUNTER — Other Ambulatory Visit: Payer: Self-pay

## 2020-12-24 ENCOUNTER — Encounter (HOSPITAL_COMMUNITY): Payer: Self-pay | Admitting: Physician Assistant

## 2020-12-24 DIAGNOSIS — K219 Gastro-esophageal reflux disease without esophagitis: Secondary | ICD-10-CM | POA: Diagnosis present

## 2020-12-24 DIAGNOSIS — Z6841 Body Mass Index (BMI) 40.0 and over, adult: Secondary | ICD-10-CM | POA: Diagnosis not present

## 2020-12-24 DIAGNOSIS — E1140 Type 2 diabetes mellitus with diabetic neuropathy, unspecified: Secondary | ICD-10-CM | POA: Diagnosis present

## 2020-12-24 DIAGNOSIS — Z91018 Allergy to other foods: Secondary | ICD-10-CM | POA: Diagnosis not present

## 2020-12-24 DIAGNOSIS — Z87891 Personal history of nicotine dependence: Secondary | ICD-10-CM | POA: Diagnosis not present

## 2020-12-24 DIAGNOSIS — Z7984 Long term (current) use of oral hypoglycemic drugs: Secondary | ICD-10-CM

## 2020-12-24 DIAGNOSIS — Z79899 Other long term (current) drug therapy: Secondary | ICD-10-CM

## 2020-12-24 DIAGNOSIS — Z833 Family history of diabetes mellitus: Secondary | ICD-10-CM | POA: Diagnosis not present

## 2020-12-24 DIAGNOSIS — G4730 Sleep apnea, unspecified: Secondary | ICD-10-CM | POA: Diagnosis present

## 2020-12-24 DIAGNOSIS — Z803 Family history of malignant neoplasm of breast: Secondary | ICD-10-CM | POA: Diagnosis not present

## 2020-12-24 DIAGNOSIS — Z800 Family history of malignant neoplasm of digestive organs: Secondary | ICD-10-CM | POA: Diagnosis not present

## 2020-12-24 DIAGNOSIS — Z832 Family history of diseases of the blood and blood-forming organs and certain disorders involving the immune mechanism: Secondary | ICD-10-CM

## 2020-12-24 DIAGNOSIS — Z8349 Family history of other endocrine, nutritional and metabolic diseases: Secondary | ICD-10-CM

## 2020-12-24 DIAGNOSIS — Z91013 Allergy to seafood: Secondary | ICD-10-CM

## 2020-12-24 DIAGNOSIS — Z823 Family history of stroke: Secondary | ICD-10-CM

## 2020-12-24 DIAGNOSIS — Z886 Allergy status to analgesic agent status: Secondary | ICD-10-CM

## 2020-12-24 DIAGNOSIS — K760 Fatty (change of) liver, not elsewhere classified: Secondary | ICD-10-CM | POA: Diagnosis present

## 2020-12-24 DIAGNOSIS — Z880 Allergy status to penicillin: Secondary | ICD-10-CM

## 2020-12-24 DIAGNOSIS — Z8249 Family history of ischemic heart disease and other diseases of the circulatory system: Secondary | ICD-10-CM

## 2020-12-24 DIAGNOSIS — Z83438 Family history of other disorder of lipoprotein metabolism and other lipidemia: Secondary | ICD-10-CM

## 2020-12-24 DIAGNOSIS — K589 Irritable bowel syndrome without diarrhea: Secondary | ICD-10-CM | POA: Diagnosis not present

## 2020-12-24 DIAGNOSIS — Z841 Family history of disorders of kidney and ureter: Secondary | ICD-10-CM | POA: Diagnosis not present

## 2020-12-24 HISTORY — PX: LAPAROSCOPIC GASTRIC SLEEVE RESECTION: SHX5895

## 2020-12-24 HISTORY — PX: UPPER GI ENDOSCOPY: SHX6162

## 2020-12-24 LAB — HEPATIC FUNCTION PANEL
ALT: 155 U/L — ABNORMAL HIGH (ref 0–44)
AST: 156 U/L — ABNORMAL HIGH (ref 15–41)
Albumin: 3.7 g/dL (ref 3.5–5.0)
Alkaline Phosphatase: 63 U/L (ref 38–126)
Bilirubin, Direct: 0.3 mg/dL — ABNORMAL HIGH (ref 0.0–0.2)
Indirect Bilirubin: 1.1 mg/dL — ABNORMAL HIGH (ref 0.3–0.9)
Total Bilirubin: 1.4 mg/dL — ABNORMAL HIGH (ref 0.3–1.2)
Total Protein: 7.7 g/dL (ref 6.5–8.1)

## 2020-12-24 LAB — TYPE AND SCREEN
ABO/RH(D): O POS
Antibody Screen: NEGATIVE

## 2020-12-24 LAB — GLUCOSE, CAPILLARY
Glucose-Capillary: 119 mg/dL — ABNORMAL HIGH (ref 70–99)
Glucose-Capillary: 152 mg/dL — ABNORMAL HIGH (ref 70–99)

## 2020-12-24 SURGERY — GASTRECTOMY, SLEEVE, LAPAROSCOPIC
Anesthesia: General

## 2020-12-24 MED ORDER — LACTATED RINGERS IR SOLN
Status: DC | PRN
  Administered 2020-12-24: 1

## 2020-12-24 MED ORDER — PANTOPRAZOLE SODIUM 40 MG IV SOLR
40.0000 mg | Freq: Every day | INTRAVENOUS | Status: DC
  Administered 2020-12-24: 40 mg via INTRAVENOUS
  Filled 2020-12-24: qty 40

## 2020-12-24 MED ORDER — OXYCODONE HCL 5 MG/5ML PO SOLN
5.0000 mg | Freq: Three times a day (TID) | ORAL | Status: DC | PRN
  Filled 2020-12-24 (×2): qty 5

## 2020-12-24 MED ORDER — ALBUTEROL SULFATE (2.5 MG/3ML) 0.083% IN NEBU: 2.0000 mL | INHALATION_SOLUTION | Freq: Four times a day (QID) | RESPIRATORY_TRACT | Status: DC | PRN

## 2020-12-24 MED ORDER — BUPIVACAINE LIPOSOME 1.3 % IJ SUSP
INTRAMUSCULAR | Status: DC | PRN
  Administered 2020-12-24: 20 mL

## 2020-12-24 MED ORDER — HEPARIN SODIUM (PORCINE) 5000 UNIT/ML IJ SOLN
5000.0000 [IU] | INTRAMUSCULAR | Status: AC
  Administered 2020-12-24: 5000 [IU] via SUBCUTANEOUS
  Filled 2020-12-24: qty 1

## 2020-12-24 MED ORDER — ENSURE MAX PROTEIN PO LIQD
2.0000 [oz_av] | ORAL | Status: DC
  Administered 2020-12-25: 2 [oz_av] via ORAL

## 2020-12-24 MED ORDER — APREPITANT 40 MG PO CAPS
40.0000 mg | ORAL_CAPSULE | ORAL | Status: AC
  Administered 2020-12-24: 40 mg via ORAL
  Filled 2020-12-24: qty 1

## 2020-12-24 MED ORDER — FENTANYL CITRATE PF 50 MCG/ML IJ SOSY
25.0000 ug | PREFILLED_SYRINGE | INTRAMUSCULAR | Status: DC | PRN
  Administered 2020-12-24 (×2): 25 ug via INTRAVENOUS
  Administered 2020-12-24 (×2): 50 ug via INTRAVENOUS

## 2020-12-24 MED ORDER — OXYCODONE HCL 5 MG PO TABS
5.0000 mg | ORAL_TABLET | Freq: Once | ORAL | Status: AC | PRN
  Administered 2020-12-24: 5 mg via ORAL

## 2020-12-24 MED ORDER — METHOCARBAMOL 1000 MG/10ML IJ SOLN
500.0000 mg | Freq: Four times a day (QID) | INTRAVENOUS | Status: DC | PRN
  Filled 2020-12-24: qty 5

## 2020-12-24 MED ORDER — VASOPRESSIN 20 UNIT/ML IV SOLN
INTRAVENOUS | Status: DC | PRN
  Administered 2020-12-24: 1 [IU] via INTRAVENOUS

## 2020-12-24 MED ORDER — TRAZODONE HCL 100 MG PO TABS
100.0000 mg | ORAL_TABLET | Freq: Every evening | ORAL | Status: DC | PRN
  Filled 2020-12-24: qty 1

## 2020-12-24 MED ORDER — GENTAMICIN SULFATE 40 MG/ML IJ SOLN
5.0000 mg/kg | INTRAVENOUS | Status: DC
  Filled 2020-12-24: qty 10

## 2020-12-24 MED ORDER — LACTATED RINGERS IV SOLN: INTRAVENOUS | Status: DC

## 2020-12-24 MED ORDER — SODIUM CHLORIDE 0.9 % IV SOLN: INTRAVENOUS | Status: DC

## 2020-12-24 MED ORDER — LIDOCAINE 2% (20 MG/ML) 5 ML SYRINGE
INTRAMUSCULAR | Status: DC | PRN
  Administered 2020-12-24: 1.5 mg/kg/h via INTRAVENOUS

## 2020-12-24 MED ORDER — ACETAMINOPHEN 500 MG PO TABS: 1000.0000 mg | ORAL_TABLET | ORAL | Status: DC

## 2020-12-24 MED ORDER — METOCLOPRAMIDE HCL 5 MG/ML IJ SOLN
10.0000 mg | Freq: Four times a day (QID) | INTRAMUSCULAR | Status: DC
  Administered 2020-12-24 – 2020-12-25 (×4): 10 mg via INTRAVENOUS
  Filled 2020-12-24 (×4): qty 2

## 2020-12-24 MED ORDER — CLINDAMYCIN PHOSPHATE 900 MG/50ML IV SOLN
900.0000 mg | Freq: Once | INTRAVENOUS | Status: AC
  Administered 2020-12-24: 900 mg via INTRAVENOUS
  Filled 2020-12-24: qty 50

## 2020-12-24 MED ORDER — HYDROMORPHONE HCL 1 MG/ML IJ SOLN
0.5000 mg | INTRAMUSCULAR | Status: DC | PRN
  Administered 2020-12-24 (×2): 0.5 mg via INTRAVENOUS
  Filled 2020-12-24 (×2): qty 0.5

## 2020-12-24 MED ORDER — AMISULPRIDE (ANTIEMETIC) 5 MG/2ML IV SOLN: 10.0000 mg | Freq: Once | INTRAVENOUS | Status: DC | PRN

## 2020-12-24 MED ORDER — CHLORHEXIDINE GLUCONATE 0.12 % MT SOLN
15.0000 mL | Freq: Once | OROMUCOSAL | Status: AC
  Administered 2020-12-24: 15 mL via OROMUCOSAL

## 2020-12-24 MED ORDER — PROPOFOL 10 MG/ML IV BOLUS
INTRAVENOUS | Status: DC | PRN
  Administered 2020-12-24: 150 mg via INTRAVENOUS

## 2020-12-24 MED ORDER — STERILE WATER FOR IRRIGATION IR SOLN
Status: DC | PRN
  Administered 2020-12-24: 1000 mL

## 2020-12-24 MED ORDER — BUPIVACAINE-EPINEPHRINE 0.25% -1:200000 IJ SOLN
INTRAMUSCULAR | Status: DC | PRN
  Administered 2020-12-24: 30 mL

## 2020-12-24 MED ORDER — SCOPOLAMINE 1 MG/3DAYS TD PT72
1.0000 | MEDICATED_PATCH | TRANSDERMAL | Status: DC
  Administered 2020-12-24: 1.5 mg via TRANSDERMAL
  Filled 2020-12-24: qty 1

## 2020-12-24 MED ORDER — EPHEDRINE SULFATE-NACL 50-0.9 MG/10ML-% IV SOSY
PREFILLED_SYRINGE | INTRAVENOUS | Status: DC | PRN
  Administered 2020-12-24: 5 mg via INTRAVENOUS
  Administered 2020-12-24 (×2): 10 mg via INTRAVENOUS

## 2020-12-24 MED ORDER — LIDOCAINE 2% (20 MG/ML) 5 ML SYRINGE
INTRAMUSCULAR | Status: DC | PRN
  Administered 2020-12-24: 80 mg via INTRAVENOUS

## 2020-12-24 MED ORDER — 0.9 % SODIUM CHLORIDE (POUR BTL) OPTIME
TOPICAL | Status: DC | PRN
  Administered 2020-12-24: 1000 mL

## 2020-12-24 MED ORDER — HEMOSTATIC AGENTS (NO CHARGE) OPTIME
TOPICAL | Status: DC | PRN
  Administered 2020-12-24: 1 via TOPICAL

## 2020-12-24 MED ORDER — FENTANYL CITRATE (PF) 250 MCG/5ML IJ SOLN
INTRAMUSCULAR | Status: DC | PRN
  Administered 2020-12-24 (×5): 50 ug via INTRAVENOUS

## 2020-12-24 MED ORDER — INFLUENZA VAC SPLIT QUAD 0.5 ML IM SUSY
0.5000 mL | PREFILLED_SYRINGE | INTRAMUSCULAR | Status: DC
  Filled 2020-12-24: qty 0.5

## 2020-12-24 MED ORDER — SUGAMMADEX SODIUM 500 MG/5ML IV SOLN
INTRAVENOUS | Status: DC | PRN
  Administered 2020-12-24: 300 mg via INTRAVENOUS

## 2020-12-24 MED ORDER — ONDANSETRON HCL 4 MG/2ML IJ SOLN: 4.0000 mg | INTRAMUSCULAR | Status: DC | PRN

## 2020-12-24 MED ORDER — ONDANSETRON HCL 4 MG/2ML IJ SOLN
INTRAMUSCULAR | Status: DC | PRN
  Administered 2020-12-24: 4 mg via INTRAVENOUS

## 2020-12-24 MED ORDER — ACETAMINOPHEN 500 MG PO TABS
1000.0000 mg | ORAL_TABLET | Freq: Three times a day (TID) | ORAL | Status: DC
  Administered 2020-12-24 – 2020-12-25 (×3): 1000 mg via ORAL
  Filled 2020-12-24 (×3): qty 2

## 2020-12-24 MED ORDER — BUPIVACAINE LIPOSOME 1.3 % IJ SUSP: 20.0000 mL | Freq: Once | INTRAMUSCULAR | Status: DC

## 2020-12-24 MED ORDER — GABAPENTIN 300 MG PO CAPS
300.0000 mg | ORAL_CAPSULE | ORAL | Status: AC
  Administered 2020-12-24: 300 mg via ORAL
  Filled 2020-12-24: qty 1

## 2020-12-24 MED ORDER — MIDAZOLAM HCL 5 MG/5ML IJ SOLN
INTRAMUSCULAR | Status: DC | PRN
  Administered 2020-12-24: 2 mg via INTRAVENOUS

## 2020-12-24 MED ORDER — GABAPENTIN 300 MG PO CAPS
300.0000 mg | ORAL_CAPSULE | Freq: Three times a day (TID) | ORAL | Status: DC
  Administered 2020-12-24 – 2020-12-25 (×3): 300 mg via ORAL
  Filled 2020-12-24 (×3): qty 1

## 2020-12-24 MED ORDER — KETAMINE HCL 10 MG/ML IJ SOLN
INTRAMUSCULAR | Status: DC | PRN
  Administered 2020-12-24: 30 mg via INTRAVENOUS

## 2020-12-24 MED ORDER — ORAL CARE MOUTH RINSE: 15.0000 mL | Freq: Once | OROMUCOSAL | Status: AC

## 2020-12-24 MED ORDER — ALBUMIN HUMAN 5 % IV SOLN: INTRAVENOUS | Status: DC | PRN

## 2020-12-24 MED ORDER — PROMETHAZINE HCL 25 MG/ML IJ SOLN: 6.2500 mg | INTRAMUSCULAR | Status: DC | PRN

## 2020-12-24 MED ORDER — PHENYLEPHRINE HCL-NACL 20-0.9 MG/250ML-% IV SOLN
INTRAVENOUS | Status: DC | PRN
  Administered 2020-12-24: 35 ug/min via INTRAVENOUS

## 2020-12-24 MED ORDER — ACETAMINOPHEN 160 MG/5ML PO SOLN: 1000.0000 mg | Freq: Three times a day (TID) | ORAL | Status: DC

## 2020-12-24 MED ORDER — CHLORHEXIDINE GLUCONATE 4 % EX LIQD: 60.0000 mL | Freq: Once | CUTANEOUS | Status: DC

## 2020-12-24 MED ORDER — GENTAMICIN SULFATE 40 MG/ML IJ SOLN
5.0000 mg/kg | Freq: Once | INTRAVENOUS | Status: AC
  Administered 2020-12-24: 400 mg via INTRAVENOUS
  Filled 2020-12-24: qty 10

## 2020-12-24 MED ORDER — DOCUSATE SODIUM 100 MG PO CAPS
100.0000 mg | ORAL_CAPSULE | Freq: Two times a day (BID) | ORAL | Status: DC
  Administered 2020-12-24 – 2020-12-25 (×2): 100 mg via ORAL
  Filled 2020-12-24 (×2): qty 1

## 2020-12-24 MED ORDER — OXYCODONE HCL 5 MG/5ML PO SOLN: 5.0000 mg | Freq: Once | ORAL | Status: AC | PRN

## 2020-12-24 MED ORDER — TRAMADOL HCL 50 MG PO TABS
50.0000 mg | ORAL_TABLET | Freq: Four times a day (QID) | ORAL | Status: DC | PRN
  Administered 2020-12-25 (×2): 50 mg via ORAL
  Filled 2020-12-24 (×2): qty 1

## 2020-12-24 MED ORDER — PHENYLEPHRINE 40 MCG/ML (10ML) SYRINGE FOR IV PUSH (FOR BLOOD PRESSURE SUPPORT)
PREFILLED_SYRINGE | INTRAVENOUS | Status: DC | PRN
  Administered 2020-12-24: 80 ug via INTRAVENOUS
  Administered 2020-12-24 (×4): 120 ug via INTRAVENOUS
  Administered 2020-12-24: 80 ug via INTRAVENOUS

## 2020-12-24 MED ORDER — ROCURONIUM BROMIDE 10 MG/ML (PF) SYRINGE
PREFILLED_SYRINGE | INTRAVENOUS | Status: DC | PRN
  Administered 2020-12-24: 80 mg via INTRAVENOUS

## 2020-12-24 MED ORDER — DEXAMETHASONE SODIUM PHOSPHATE 10 MG/ML IJ SOLN
INTRAMUSCULAR | Status: DC | PRN
Start: 2020-12-24 — End: 2020-12-24
  Administered 2020-12-24: 5 mg via INTRAVENOUS

## 2020-12-24 SURGICAL SUPPLY — 68 items
APPLIER CLIP 5 13 M/L LIGAMAX5 (MISCELLANEOUS) ×2
APPLIER CLIP ROT 10 11.4 M/L (STAPLE)
APPLIER CLIP ROT 13.4 12 LRG (CLIP)
BAG COUNTER SPONGE SURGICOUNT (BAG) IMPLANT
BAG LAPAROSCOPIC 12 15 PORT 16 (BASKET) ×1 IMPLANT
BAG RETRIEVAL 12/15 (BASKET) ×2
BENZOIN TINCTURE PRP APPL 2/3 (GAUZE/BANDAGES/DRESSINGS) ×2 IMPLANT
BLADE SURG SZ11 CARB STEEL (BLADE) ×2 IMPLANT
BNDG ADH 1X3 SHEER STRL LF (GAUZE/BANDAGES/DRESSINGS) ×12 IMPLANT
CABLE HIGH FREQUENCY MONO STRZ (ELECTRODE) ×2 IMPLANT
CHLORAPREP W/TINT 26 (MISCELLANEOUS) ×4 IMPLANT
CLIP APPLIE 5 13 M/L LIGAMAX5 (MISCELLANEOUS) ×1 IMPLANT
CLIP APPLIE ROT 10 11.4 M/L (STAPLE) IMPLANT
CLIP APPLIE ROT 13.4 12 LRG (CLIP) IMPLANT
CLSR STERI-STRIP ANTIMIC 1/2X4 (GAUZE/BANDAGES/DRESSINGS) ×2 IMPLANT
COVER SURGICAL LIGHT HANDLE (MISCELLANEOUS) ×2 IMPLANT
DECANTER SPIKE VIAL GLASS SM (MISCELLANEOUS) ×2 IMPLANT
DEVICE SUT QUICK LOAD TK 5 (STAPLE) IMPLANT
DEVICE SUT TI-KNOT TK 5X26 (MISCELLANEOUS) IMPLANT
DRAPE UTILITY XL STRL (DRAPES) ×4 IMPLANT
ELECT REM PT RETURN 15FT ADLT (MISCELLANEOUS) ×2 IMPLANT
GAUZE SPONGE 4X4 12PLY STRL (GAUZE/BANDAGES/DRESSINGS) IMPLANT
GLOVE SURG ENC MOIS LTX SZ6 (GLOVE) ×2 IMPLANT
GLOVE SURG MICRO LTX SZ6 (GLOVE) ×2 IMPLANT
GLOVE SURG UNDER LTX SZ6.5 (GLOVE) ×2 IMPLANT
GOWN STRL REUS W/TWL LRG LVL3 (GOWN DISPOSABLE) ×2 IMPLANT
GOWN STRL REUS W/TWL XL LVL3 (GOWN DISPOSABLE) ×4 IMPLANT
GRASPER SUT TROCAR 14GX15 (MISCELLANEOUS) ×2 IMPLANT
HEMOSTAT SNOW SURGICEL 2X4 (HEMOSTASIS) ×2 IMPLANT
IRRIG SUCT STRYKERFLOW 2 WTIP (MISCELLANEOUS) ×2
IRRIGATION SUCT STRKRFLW 2 WTP (MISCELLANEOUS) ×1 IMPLANT
KIT BASIN OR (CUSTOM PROCEDURE TRAY) ×2 IMPLANT
KIT TURNOVER KIT A (KITS) IMPLANT
MARKER SKIN DUAL TIP RULER LAB (MISCELLANEOUS) ×2 IMPLANT
MAT PREVALON FULL STRYKER (MISCELLANEOUS) ×2 IMPLANT
NEEDLE SPNL 22GX3.5 QUINCKE BK (NEEDLE) ×2 IMPLANT
PACK UNIVERSAL I (CUSTOM PROCEDURE TRAY) ×2 IMPLANT
RELOAD ENDO STITCH (ENDOMECHANICALS) IMPLANT
RELOAD STAPLER BLUE 60MM (STAPLE) ×5 IMPLANT
RELOAD STAPLER GOLD 60MM (STAPLE) ×1 IMPLANT
RELOAD STAPLER GREEN 60MM (STAPLE) ×1 IMPLANT
SCISSORS LAP 5X45 EPIX DISP (ENDOMECHANICALS) ×2 IMPLANT
SET TUBE SMOKE EVAC HIGH FLOW (TUBING) ×2 IMPLANT
SHEARS HARMONIC ACE PLUS 45CM (MISCELLANEOUS) ×2 IMPLANT
SLEEVE ADV FIXATION 5X100MM (TROCAR) ×6 IMPLANT
SLEEVE GASTRECTOMY 40FR VISIGI (MISCELLANEOUS) ×2 IMPLANT
SOL ANTI FOG 6CC (MISCELLANEOUS) ×1 IMPLANT
SOLUTION ANTI FOG 6CC (MISCELLANEOUS) ×1
SPONGE T-LAP 18X18 ~~LOC~~+RFID (SPONGE) ×2 IMPLANT
STAPLE LINE REINFORCEMENT LAP (STAPLE) ×12 IMPLANT
STAPLER ECHELON LONG 60 440 (INSTRUMENTS) ×2 IMPLANT
STAPLER RELOAD BLUE 60MM (STAPLE) ×10
STAPLER RELOAD GOLD 60MM (STAPLE) ×2
STAPLER RELOAD GREEN 60MM (STAPLE) ×2
STRIP CLOSURE SKIN 1/2X4 (GAUZE/BANDAGES/DRESSINGS) ×2 IMPLANT
SUT MNCRL AB 4-0 PS2 18 (SUTURE) ×4 IMPLANT
SUT SURGIDAC NAB ES-9 0 48 120 (SUTURE) IMPLANT
SUT VICRYL 0 TIES 12 18 (SUTURE) ×2 IMPLANT
SYR 10ML ECCENTRIC (SYRINGE) ×2 IMPLANT
SYR 20ML LL LF (SYRINGE) ×2 IMPLANT
SYR 50ML LL SCALE MARK (SYRINGE) ×2 IMPLANT
TOWEL OR 17X26 10 PK STRL BLUE (TOWEL DISPOSABLE) ×2 IMPLANT
TOWEL OR NON WOVEN STRL DISP B (DISPOSABLE) ×2 IMPLANT
TROCAR ADV FIXATION 5X100MM (TROCAR) ×2 IMPLANT
TROCAR BLADELESS 15MM (ENDOMECHANICALS) ×2 IMPLANT
TROCAR BLADELESS OPT 5 100 (ENDOMECHANICALS) ×2 IMPLANT
TUBING CONNECTING 10 (TUBING) ×2 IMPLANT
TUBING ENDO SMARTCAP (MISCELLANEOUS) ×2 IMPLANT

## 2020-12-24 NOTE — Progress Notes (Signed)
Anesthesia Chart Review  Date: 12/24/20 Reason: anesthesia consult     DISCUSSION:45 y.o. with h/o GERD, asthma, DM II, OSA.   12/21/2020 3:30pm: PAT nurse called with abnormal labs, AST 178 ALT 129.  In reviewing patient's chart she has no known liver disease, most recent labs 03/02/2020 with normal AST/ALT. Spoke with triage nurse at Dr. Ron Parker office, Mammie Lorenzo.  Asked Claiborne Billings to make Dr. [name] aware of lab results and advised these abnormal labs would need to be evaluated prior to her upcoming elective surgery.    VS: BP (!) 138/97   Pulse 90   Temp 36.6 C (Oral)   Resp 16   Ht 5\' 5"  (1.651 m)   Wt 117 kg   LMP 11/27/2012   SpO2 97%   BMI 42.93 kg/m   PROVIDERS: Elsie Stain, MD   LABS:  Elevated liver enzymes (all labs ordered are listed, but only abnormal results are displayed)  Labs Reviewed  COMPREHENSIVE METABOLIC PANEL - Abnormal; Notable for the following components:      Result Value   Glucose, Bld 149 (*)    AST 178 (*)    ALT 129 (*)    All other components within normal limits  HEMOGLOBIN A1C - Abnormal; Notable for the following components:   Hgb A1c MFr Bld 7.5 (*)    All other components within normal limits  GLUCOSE, CAPILLARY - Abnormal; Notable for the following components:   Glucose-Capillary 155 (*)    All other components within normal limits  CBC WITH DIFFERENTIAL/PLATELET  TYPE AND SCREEN     IMAGES:   EKG: 03/12/2020 Rate 78 bpm  NSR  CV: Echo 12/30/2012 Study Conclusions   Left ventricle: The cavity size was normal. There was mild  concentric hypertrophy. Systolic function was normal. The  estimated ejection fraction was in the range of 55% to 60%.  Wall motion was normal; there were no regional wall motion  abnormalities.        Past Medical History:  Diagnosis Date   Abdominal pain 04/26/2013   Abnormal uterine bleeding (AUB) 10/11/2012   Acute bronchitis    Allergy    Anal pain    chronic   Anemia     Anxiety    Asthma    exacerbation 02-28-2014 and 02-23-2014 secondary to Rhinovirus   Atypical chest pain 04/26/2013   Benign neoplasm of sigmoid colon    Benign neoplasm of transverse colon    Carbuncle of labium 07/12/2015   Chest pain 02/20/2014   Chronic diarrhea    Chronic headaches    Chronic low back pain    Cigarette nicotine dependence without complication 24/10/7351   Cyst of right ovary    Dandruff 03/26/2015   Diabetes mellitus without complication ([place]) 29/92/4268   Difficult intravenous access    PER PT NEEDS PICC LINE   Dyspnea 09/07/2012   [name] 08/2012:  No obstruction by FEV1%, but probable restriction.     Falls 03/27/2014   Food allergy    Mushrooms, shellfish   GAD (generalized anxiety disorder) 05/04/2014   Gait disturbance 04/11/2014   Gait instability    GERD (gastroesophageal reflux disease)    History of adenomatous polyp of colon    History of cardiac arrest    during SVD 1992   History of ectopic pregnancy    2009-  S/P LEFT SALPINGECTOMY   History of panic attacks    Hyperlipidemia    IBS (irritable bowel syndrome)    Insomnia 03/06/2014  Joint pain    Lower extremity edema    Lumbar stenosis L4 -- L5 with bulging disk   w/ right leg weakness/ decreased mobility   Migraine variant with headache 05/09/2014   Mild obstructive sleep apnea    study 03-20-2014  no cpap recommended   Neuromuscular disorder (HCC)    neuropathy in feet    Neuropathic pain of both legs 06/04/2016   Obesity (BMI 30-39.9) 03/05/2018   OSA (obstructive sleep apnea) 03/06/2014   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   Panniculitis 03/05/2018   Pelvic pain 07/25/2013   Persistent vomiting 04/27/2013   Pneumonia    PTSD (post-traumatic stress disorder) 05/04/2014   Rash and nonspecific skin eruption 07/12/2015   Rectal bleeding 07/25/2013   RLQ abdominal pain    S/P Total vaginal hysterectomy on 01/06/13 01/06/2013   Sleep apnea    mild no cpap   Social  anxiety disorder 05/04/2014   Sore throat 02/20/2014   Stomach ulcer    Tachycardia 02/20/2014   Type 2 diabetes mellitus (HCC)    Weakness of right leg    FROM BACK PROBLEM PER PT    Past Surgical History:  Procedure Laterality Date   ABDOMINAL HYSTERECTOMY     partial   COLONOSCOPY Left 04/29/2013   Procedure: COLONOSCOPY;  Surgeon: Arta Silence, MD;  Location: [facility];  Service: Endoscopy;  Laterality: Left;   COLONOSCOPY     COLONOSCOPY WITH PROPOFOL N/A 08/01/2016   Procedure: COLONOSCOPY WITH PROPOFOL;  Surgeon: Doran Stabler, MD;  Location: [facility];  Service: Gastroenterology;  Laterality: N/A;   ECTOPIC PREGNANCY SURGERY     ESOPHAGOGASTRODUODENOSCOPY (EGD) WITH PROPOFOL N/A 11/10/2018   Procedure: ESOPHAGOGASTRODUODENOSCOPY (EGD) WITH PROPOFOL;  Surgeon: Doran Stabler, MD;  Location: [facility];  Service: Gastroenterology;  Laterality: N/A;   EVALUATION UNDER ANESTHESIA WITH FISTULECTOMY N/A 04/20/2014   Procedure: EXAM UNDER ANESTHESIA ;  Surgeon: Leighton Ruff, MD;  Location: [facility];  Service: General;  Laterality: N/A;   FLEXIBLE SIGMOIDOSCOPY N/A 11/09/2013   Procedure: FLEXIBLE SIGMOIDOSCOPY;  Surgeon: Arta Silence, MD;  Location: [facility];  Service: Endoscopy;  Laterality: N/A;   fupa removal      LAPAROSCOPIC CHOLECYSTECTOMY  2005   REFRACTIVE SURGERY     SPHINCTEROTOMY N/A 04/20/2014   Procedure:  LATERAL INTERNAL SPHINCTEROTOMY;  Surgeon: Leighton Ruff, MD;  Location[facility];  Service: General;  Laterality: N/A;   TRANSTHORACIC ECHOCARDIOGRAM  12/30/2012   mild LVH/  ef 55-60%   UNILATERAL SALPINGECTOMY  2009   laparotomy left salpingectomy-- ectopic preg.   UPPER GASTROINTESTINAL ENDOSCOPY     VAGINAL HYSTERECTOMY N/A 01/06/2013   Procedure: HYSTERECTOMY VAGINAL;  Surgeon: Osborne Oman, MD;  Location: [facility];  Service: Gynecology;  Laterality: N/A;    MEDICATIONS:  albuterol (PROVENTIL  HFA) 108 (90 Base) MCG/ACT inhaler   atorvastatin (LIPITOR) 10 MG tablet   dicyclomine (BENTYL) 10 MG capsule   EPINEPHrine 0.3 mg/0.3 mL IJ SOAJ injection   gabapentin (NEURONTIN) 300 MG capsule   ibuprofen (ADVIL) 600 MG tablet   loratadine (CLARITIN) 10 MG tablet   metFORMIN (GLUCOPHAGE) 500 MG tablet   montelukast (SINGULAIR) 10 MG tablet   Olopatadine HCl (PATADAY) 0.2 % SOLN   pantoprazole (PROTONIX) 40 MG tablet   RYBELSUS 7 MG TABS   traMADol (ULTRAM) 50 MG tablet   traZODone (DESYREL) 100 MG tablet   Vitamin D, Ergocalciferol, (DRISDOL) 1.25 MG (50000 UNIT) CAPS capsule  No current facility-administered medications for this encounter.    Konrad Felix Ward, PA-C [facility] [phone]

## 2020-12-24 NOTE — Anesthesia Procedure Notes (Signed)
Procedure Name: Intubation Date/Time: 12/24/2020 11:57 AM Performed by: Maxwell Caul, CRNA Pre-anesthesia Checklist: Patient identified, Emergency Drugs available, Suction available and Patient being monitored Patient Re-evaluated:Patient Re-evaluated prior to induction Oxygen Delivery Method: Circle system utilized Preoxygenation: Pre-oxygenation with 100% oxygen Induction Type: IV induction Ventilation: Mask ventilation without difficulty Laryngoscope Size: Mac and 4 Grade View: Grade I Tube type: Oral Tube size: 7.5 mm Number of attempts: 1 Airway Equipment and Method: Stylet Placement Confirmation: ETT inserted through vocal cords under direct vision, positive ETCO2 and breath sounds checked- equal and bilateral Secured at: 23 cm Tube secured with: Tape Dental Injury: Teeth and Oropharynx as per pre-operative assessment

## 2020-12-24 NOTE — Progress Notes (Signed)

## 2020-12-24 NOTE — Op Note (Signed)
Operative Note  Marisa [name]  [id number]  [id number]  12/24/2020   Surgeon: Romana Juniper MD   Assistant: Greer Pickerel MD   Procedure performed: laparoscopic sleeve gastrectomy, upper endoscopy   Preop diagnosis: Morbid obesity Body mass index is 42 kg/m. Post-op diagnosis/intraop findings: same, hepatomegaly with hepatic steatosis   Specimens: fundus Retained items: none  EBL: 57QI Complications: none   Description of procedure: After obtaining informed consent and administration of chemical DVT prophylaxis in holding, the patient was taken to the operating room and placed supine on operating room table where general endotracheal anesthesia was initiated, preoperative antibiotics were administered, SCDs applied, and a formal timeout was performed. The abdomen was prepped and draped in usual sterile fashion. Peritoneal access was gained using a Visiport technique in the left upper quadrant and insufflation to 15 mmHg ensued without issue. Gross inspection revealed no evidence of injury.  Filmy omental adhesions to the lower midline abdominal wall were left in situ.  The liver is notably enlarged and rounded consistent with hepatic steatosis. Under direct visualization three more 5 mm trochars were placed in the right and left hemiabdomen and the 81mm trocar in the right paramedian upper abdomen. Bilateral laparoscopic assisted TAPS blocks were performed with Exparel diluted with 0.25 percent Marcaine with epinephrine. The patient was placed in steep Trendelenburg and the liver retractor was introduced through an incision in the upper midline and secured to the post externally to maintain the left lobe retracted anteriorly.  There is no hiatal hernia on direct inspection. Using the Harmonic scalpel, the greater curvature of the stomach was dissected away from the greater omentum and short gastric vessels were divided. This began 6 cm from the pylorus, and dissection proceeded until the  left crus was clearly exposed. Esophageal fat pad was mobilized off the anterior stomach slightly. The 29 [place] VisiGi was then introduced and directed down towards the pylorus. This was placed to suction against the lesser curve. Serial fires of the linear cutting stapler with seam guards were then employed to create our sleeve. The first fire used a green load and ensured adequate room at the angularis incisura. One gold load and then several blue loads were then employed to create a narrow tubular stomach up to the angle of His.  Bleeding was noted from several areas of the staple line as well as from the most proximal edge of the stomach/fat pad and this was addressed with clips.  Surgicel snow was placed along the most proximal aspect of the staple line.  The abdomen was surveyed including the divided short gastrics and omentum and irrigated and hemostasis was confirmed. The excised stomach was then removed through our 15 mm trocar site within an Endo Catch bag.  Upper endoscopy was performed by the assistant surgeon and the sleeve was noted to be airtight, the staple line was hemostatic. Please see his separate note. The endoscope was removed. The 15 mm trocar site fascia in the right upper abdomen was closed with 2 interrupted sutures of 0 Vicryl using the laparoscopic suture passer under direct visualization. The liver retractor was removed under direct visualization. The abdomen was then desufflated and all remaining trochars removed. The skin incisions were closed with subcuticular Monocryl; benzoin, Steri-Strips and Band-Aids were applied The patient was then awakened, extubated and taken to PACU in stable condition.     All counts were correct at the completion of the case.

## 2020-12-24 NOTE — Progress Notes (Signed)
Water started at 1715.

## 2020-12-24 NOTE — Anesthesia Postprocedure Evaluation (Signed)
Anesthesia Post Note  Patient: Marisa [name]  Procedure(s) Performed: LAPAROSCOPIC GASTRIC SLEEVE RESECTION UPPER GI ENDOSCOPY     Patient location during evaluation: PACU Anesthesia Type: General Level of consciousness: awake and alert Pain management: pain level controlled Vital Signs Assessment: post-procedure vital signs reviewed and stable Respiratory status: spontaneous breathing, nonlabored ventilation, respiratory function stable and patient connected to nasal cannula oxygen Cardiovascular status: blood pressure returned to baseline and stable Postop Assessment: no apparent nausea or vomiting Anesthetic complications: no   No notable events documented.  Last Vitals:  Vitals:   12/24/20 1345 12/24/20 1415  BP: (!) 152/89 (!) 149/105  Pulse: (!) 105 (!) 116  Resp: 17 (!) 26  Temp: 36.7 C   SpO2: 98% 94%    Last Pain:  Vitals:   12/24/20 1438  TempSrc:   PainSc: 10-Worst pain ever                 Merlinda Frederick

## 2020-12-24 NOTE — Interval H&P Note (Signed)
History and Physical Interval Note:  12/24/2020 11:17 AM  Marisa [name]  has presented today for surgery, with the diagnosis of morbid obesity.  The various methods of treatment have been discussed with the patient and family. After consideration of risks, benefits and other options for treatment, the patient has consented to  Procedure(s): LAPAROSCOPIC GASTRIC SLEEVE RESECTION (N/A) UPPER GI ENDOSCOPY (N/A) as a surgical intervention.  The patient's history has been reviewed, patient examined, no change in status, stable for surgery.  I have reviewed the patient's chart and labs.  Questions were answered to the patient's satisfaction.    Patient noted to have elevated AST and ALT on her preop labs on Friday.  She denies any abdominal pain, nausea, vomiting, abdominal bloating, change in bowel movements, jaundice, fever, or symptoms whatsoever.  Repeat labs today are essentially stable.  Suspect this is secondary to hepatic steatosis as noted on CT scan earlier this year.  I have discussed her case with my partners as well as discussed these results with the patient and we will plan to proceed with her sleeve gastrectomy as scheduled today.     Maranda Marte Rich Brave

## 2020-12-24 NOTE — Transfer of Care (Signed)
Immediate Anesthesia Transfer of Care Note  Patient: Marisa [name]  Procedure(s) Performed: LAPAROSCOPIC GASTRIC SLEEVE RESECTION UPPER GI ENDOSCOPY  Patient Location: PACU  Anesthesia Type:General  Level of Consciousness: awake, alert  and oriented  Airway & Oxygen Therapy: Patient Spontanous Breathing and Patient connected to face mask oxygen  Post-op Assessment: Report given to RN and Post -op Vital signs reviewed and stable  Post vital signs: Reviewed and stable  Last Vitals:  Vitals Value Taken Time  BP 155/95 12/24/20 1333  Temp    Pulse 103 12/24/20 1335  Resp 18 12/24/20 1335  SpO2 100 % 12/24/20 1335  Vitals shown include unvalidated device data.  Last Pain:  Vitals:   12/24/20 1001  TempSrc:   PainSc: 0-No pain         Complications: No notable events documented.

## 2020-12-24 NOTE — Discharge Instructions (Signed)

## 2020-12-24 NOTE — Anesthesia Preprocedure Evaluation (Addendum)
Anesthesia Evaluation  Patient identified by MRN, date of birth, ID band Patient awake    Reviewed: Allergy & Precautions, NPO status , Patient's Chart, lab work & pertinent test results  Airway Mallampati: II  TM Distance: >3 FB Neck ROM: Full    Dental no notable dental hx.    Pulmonary shortness of breath, asthma , sleep apnea , former smoker,    Pulmonary exam normal breath sounds clear to auscultation       Cardiovascular negative cardio ROS Normal cardiovascular exam Rhythm:Regular Rate:Normal     Neuro/Psych  Headaches, PSYCHIATRIC DISORDERS Anxiety Depression  Neuromuscular disease (gait instability; right leg weakness. )    GI/Hepatic Neg liver ROS, PUD, GERD  ,  Endo/Other  negative endocrine ROSdiabetes  Renal/GU negative Renal ROS  negative genitourinary   Musculoskeletal  (+) Arthritis ,   Abdominal (+) + obese,   Peds negative pediatric ROS (+)  Hematology  (+) anemia ,   Anesthesia Other Findings   Reproductive/Obstetrics negative OB ROS                            Anesthesia Physical Anesthesia Plan  ASA: 3  Anesthesia Plan: General   Post-op Pain Management:    Induction: Intravenous  PONV Risk Score and Plan: 3 and Treatment may vary due to age or medical condition, Scopolamine patch - Pre-op, Ondansetron, Dexamethasone, Propofol infusion and Midazolam  Airway Management Planned: Oral ETT  Additional Equipment: None  Intra-op Plan:   Post-operative Plan: Extubation in OR  Informed Consent: I have reviewed the patients History and Physical, chart, labs and discussed the procedure including the risks, benefits and alternatives for the proposed anesthesia with the patient or authorized representative who has indicated his/her understanding and acceptance.     Dental advisory given  Plan Discussed with: CRNA, Anesthesiologist and Surgeon  Anesthesia Plan  Comments:         Anesthesia Quick Evaluation

## 2020-12-24 NOTE — Op Note (Signed)
Marisa [name] [id number] [id number] 12/24/2020  Preoperative diagnosis: severe obesity  Postoperative diagnosis: Same   Procedure: upper endoscopy   Surgeon: Leighton Ruff. [name] M.D., FACS   Anesthesia: Gen.   Indications for procedure: 44 y.o. year old female undergoing Laparoscopic Gastric Sleeve Resection and an EGD was requested to evaluate the new gastric sleeve.   Description of procedure: After we have completed the sleeve resection, I scrubbed out and obtained the Olympus endoscope. I gently placed endoscope in the patient's oropharynx and gently glided it down the esophagus without any difficulty under direct visualization. Once I was in the gastric sleeve, I insufflated the stomach with air. I was able to cannulate and advanced the scope through the gastric sleeve. I was able to cannulate the duodenum with ease. Dr. [name] had placed saline in the upper abdomen. Upon further insufflation of the gastric sleeve there was no evidence of bubbles. GE junction located at 38 cm.  Upon further inspection of the gastric sleeve, the mucosa appeared normal. There is no evidence of any mucosal abnormality. The sleeve was widely patent at the angularis. There was no evidence of bleeding. The gastric sleeve was decompressed. The scope was withdrawn. The patient tolerated this portion of the procedure well. Please see Dr Ron Parker operative note for details regarding the laparoscopic gastric sleeve resection.   Leighton Ruff. Redmond Pulling, MD, FACS  General, Bariatric, & Minimally Invasive Surgery  [facility], [place]

## 2020-12-24 NOTE — Progress Notes (Signed)
PHARMACY CONSULT FOR:  Risk Assessment for Post-Discharge VTE Following Bariatric Surgery  Post-Discharge VTE Risk Assessment: This patient's probability of 30-day post-discharge VTE is increased due to the factors marked:   Female    Age >/=60 years    BMI >/=50 kg/m2    CHF    Dyspnea at Rest    Paraplegia  X  Non-gastric-band surgery    Operation Time >/=3 hr    Return to OR     Length of Stay >/= 3 d   Hx of VTE   Hypercoagulable condition   Significant venous stasis       Predicted probability of 30-day post-discharge VTE: 0.16%  Other patient-specific factors to consider: N/A   Recommendation for Discharge: No pharmacologic prophylaxis post-discharge  GELENE RECKTENWALD is a 45 y.o. female who underwent laparoscopic sleeve gastrectomy 12/24/2020    Allergies  Allergen Reactions   Asa [Aspirin] Anaphylaxis and Hives    Hives, chest tightness    Mushroom Extract Complex Anaphylaxis, Swelling and Other (See Comments)    Reaction:  Eye swelling   Penicillins Anaphylaxis and Other (See Comments)    Has patient had a PCN reaction causing immediate rash, facial/tongue/throat swelling, SOB or lightheadedness with hypotension: Yes Has patient had a PCN reaction causing severe rash involving mucus membranes or skin necrosis: No Has patient had a PCN reaction that required hospitalization No Has patient had a PCN reaction occurring within the last 10 years: No If all of the above answers are "NO", then may proceed with Cephalosporin use.   Shellfish Allergy Anaphylaxis   Triamcinolone Other (See Comments)    Skin issues     Patient Measurements: Weight: 114.5 kg (252 lb 6.4 oz) Body mass index is 42 kg/m.  Recent Labs    12/24/20 1001  ALBUMIN 3.7  PROT 7.7  AST 156*  ALT 155*  ALKPHOS 63  BILITOT 1.4*  BILIDIR 0.3*  IBILI 1.1*   Estimated Creatinine Clearance: 111 mL/min (by C-G formula based on SCr of 0.6 mg/dL).    Past Medical History:   Diagnosis Date   Abdominal pain 04/26/2013   Abnormal uterine bleeding (AUB) 10/11/2012   Acute bronchitis    Allergy    Anal pain    chronic   Anemia    Anxiety    Asthma    exacerbation 02-28-2014 and 02-23-2014 secondary to Rhinovirus   Atypical chest pain 04/26/2013   Benign neoplasm of sigmoid colon    Benign neoplasm of transverse colon    Carbuncle of labium 07/12/2015   Chest pain 02/20/2014   Chronic diarrhea    Chronic headaches    Chronic low back pain    Cigarette nicotine dependence without complication 05/39/7673   Cyst of right ovary    Dandruff 03/26/2015   Diabetes mellitus without complication ([place]) 41/93/7902   Difficult intravenous access    PER PT NEEDS PICC LINE   Dyspnea 09/07/2012   [name] 08/2012:  No obstruction by FEV1%, but probable restriction.     Falls 03/27/2014   Food allergy    Mushrooms, shellfish   GAD (generalized anxiety disorder) 05/04/2014   Gait disturbance 04/11/2014   Gait instability    GERD (gastroesophageal reflux disease)    History of adenomatous polyp of colon    History of cardiac arrest    during SVD 1992   History of ectopic pregnancy    2009-  S/P LEFT SALPINGECTOMY   History of panic attacks    Hyperlipidemia  IBS (irritable bowel syndrome)    Insomnia 03/06/2014   Joint pain    Lower extremity edema    Lumbar stenosis L4 -- L5 with bulging disk   w/ right leg weakness/ decreased mobility   Migraine variant with headache 05/09/2014   Mild obstructive sleep apnea    study 03-20-2014  no cpap recommended   Neuromuscular disorder (HCC)    neuropathy in feet    Neuropathic pain of both legs 06/04/2016   Obesity (BMI 30-39.9) 03/05/2018   OSA (obstructive sleep apnea) 03/06/2014   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   Panniculitis 03/05/2018   Pelvic pain 07/25/2013   Persistent vomiting 04/27/2013   Pneumonia    PTSD (post-traumatic stress disorder) 05/04/2014   Rash and nonspecific skin eruption  07/12/2015   Rectal bleeding 07/25/2013   RLQ abdominal pain    S/P Total vaginal hysterectomy on 01/06/13 01/06/2013   Sleep apnea    mild no cpap   Social anxiety disorder 05/04/2014   Sore throat 02/20/2014   Stomach ulcer    Tachycardia 02/20/2014   Type 2 diabetes mellitus (HCC)    Weakness of right leg    FROM BACK PROBLEM PER PT     Medications Prior to Admission  Medication Sig Dispense Refill Last Dose   albuterol (PROVENTIL HFA) 108 (90 Base) MCG/ACT inhaler Inhale 2 puffs into the lungs every 6 (six) hours as needed for wheezing or shortness of breath. 6.7 g 2 12/24/2020 at 0700   metFORMIN (GLUCOPHAGE) 500 MG tablet Take 1 tablet (500 mg total) by mouth 2 (two) times daily with a meal. (Patient taking differently: Take 500 mg by mouth 3 (three) times daily after meals.) 60 tablet 5 12/23/2020   pantoprazole (PROTONIX) 40 MG tablet Take 1 tablet (40 mg total) by mouth daily. (Patient taking differently: Take 40 mg by mouth daily as needed (acid reflux).) 30 tablet 3 Past Month   RYBELSUS 7 MG TABS Take 1 tablet by mouth once daily 30 tablet 0 Past Week   atorvastatin (LIPITOR) 10 MG tablet Take 1 tablet (10 mg total) by mouth daily. (Patient not taking: No sig reported) 90 tablet 3    dicyclomine (BENTYL) 10 MG capsule Take 1 capsule (10 mg total) by mouth 2 (two) times daily. (Patient not taking: No sig reported) 60 capsule 1    EPINEPHrine 0.3 mg/0.3 mL IJ SOAJ injection Inject 0.3 mg into the muscle as needed for anaphylaxis.      gabapentin (NEURONTIN) 300 MG capsule Take 2 capsules (600 mg) three times per day (Patient not taking: No sig reported) 540 capsule 1    loratadine (CLARITIN) 10 MG tablet Take 1 tablet (10 mg total) by mouth daily. (Patient not taking: No sig reported) 30 tablet 4    montelukast (SINGULAIR) 10 MG tablet Take 1 tablet (10 mg total) by mouth daily. (Patient not taking: No sig reported) 60 tablet 2    Olopatadine HCl (PATADAY) 0.2 % SOLN Two drops  each eye twice daily (Patient not taking: No sig reported) 2.5 mL 0    traMADol (ULTRAM) 50 MG tablet Take 1 tablet (50 mg total) by mouth every 6 (six) hours as needed. (Patient not taking: No sig reported) 30 tablet 0    traZODone (DESYREL) 100 MG tablet Take 100-200 mg by mouth at bedtime as needed for sleep.   More than a month   Vitamin D, Ergocalciferol, (DRISDOL) 1.25 MG (50000 UNIT) CAPS capsule Take 1 capsule (50,000 Units total)  by mouth once a week. (Patient not taking: No sig reported) 4 capsule 2      Eudelia Bunch, Pharm.D [phone] 12/24/2020 4:14 PM X

## 2020-12-25 ENCOUNTER — Encounter (HOSPITAL_COMMUNITY): Payer: Self-pay | Admitting: Surgery

## 2020-12-25 ENCOUNTER — Other Ambulatory Visit (HOSPITAL_COMMUNITY): Payer: Self-pay

## 2020-12-25 LAB — CBC WITH DIFFERENTIAL/PLATELET
Abs Immature Granulocytes: 0.04 10*3/uL (ref 0.00–0.07)
Basophils Absolute: 0 10*3/uL (ref 0.0–0.1)
Basophils Relative: 0 %
Eosinophils Absolute: 0 10*3/uL (ref 0.0–0.5)
Eosinophils Relative: 0 %
HCT: 39.2 % (ref 36.0–46.0)
Hemoglobin: 13.1 g/dL (ref 12.0–15.0)
Immature Granulocytes: 0 %
Lymphocytes Relative: 15 %
Lymphs Abs: 1.4 10*3/uL (ref 0.7–4.0)
MCH: 31.6 pg (ref 26.0–34.0)
MCHC: 33.4 g/dL (ref 30.0–36.0)
MCV: 94.7 fL (ref 80.0–100.0)
Monocytes Absolute: 0.4 10*3/uL (ref 0.1–1.0)
Monocytes Relative: 4 %
Neutro Abs: 7.5 10*3/uL (ref 1.7–7.7)
Neutrophils Relative %: 81 %
Platelets: 336 10*3/uL (ref 150–400)
RBC: 4.14 MIL/uL (ref 3.87–5.11)
RDW: 12.4 % (ref 11.5–15.5)
WBC: 9.3 10*3/uL (ref 4.0–10.5)
nRBC: 0 % (ref 0.0–0.2)

## 2020-12-25 LAB — COMPREHENSIVE METABOLIC PANEL
ALT: 123 U/L — ABNORMAL HIGH (ref 0–44)
AST: 77 U/L — ABNORMAL HIGH (ref 15–41)
Albumin: 3.5 g/dL (ref 3.5–5.0)
Alkaline Phosphatase: 57 U/L (ref 38–126)
Anion gap: 9 (ref 5–15)
BUN: 6 mg/dL (ref 6–20)
CO2: 22 mmol/L (ref 22–32)
Calcium: 8.3 mg/dL — ABNORMAL LOW (ref 8.9–10.3)
Chloride: 102 mmol/L (ref 98–111)
Creatinine, Ser: 0.42 mg/dL — ABNORMAL LOW (ref 0.44–1.00)
GFR, Estimated: >60 mL/min (ref >60)
Glucose, Bld: 134 mg/dL — ABNORMAL HIGH (ref 70–99)
Potassium: 4.4 mmol/L (ref 3.5–5.1)
Sodium: 133 mmol/L — ABNORMAL LOW (ref 135–145)
Total Bilirubin: 0.6 mg/dL (ref 0.3–1.2)
Total Protein: 7.3 g/dL (ref 6.5–8.1)

## 2020-12-25 LAB — SURGICAL PATHOLOGY

## 2020-12-25 LAB — MAGNESIUM: Magnesium: 1.8 mg/dL (ref 1.7–2.4)

## 2020-12-25 MED ORDER — ACETAMINOPHEN 500 MG PO TABS
1000.0000 mg | ORAL_TABLET | Freq: Three times a day (TID) | ORAL | 0 refills | Status: AC
  Filled 2020-12-25: qty 30, 5d supply, fill #0

## 2020-12-25 MED ORDER — ENOXAPARIN SODIUM 30 MG/0.3ML IJ SOSY
30.0000 mg | PREFILLED_SYRINGE | Freq: Two times a day (BID) | INTRAMUSCULAR | Status: DC
  Administered 2020-12-25: 30 mg via SUBCUTANEOUS
  Filled 2020-12-25: qty 0.3

## 2020-12-25 MED ORDER — ONDANSETRON 4 MG PO TBDP
4.0000 mg | ORAL_TABLET | Freq: Four times a day (QID) | ORAL | 0 refills | Status: DC | PRN
  Filled 2020-12-25: qty 20, 5d supply, fill #0

## 2020-12-25 MED ORDER — GABAPENTIN 100 MG PO CAPS
200.0000 mg | ORAL_CAPSULE | Freq: Two times a day (BID) | ORAL | 0 refills | Status: DC
  Filled 2020-12-25: qty 20, 5d supply, fill #0

## 2020-12-25 MED ORDER — PANTOPRAZOLE SODIUM 40 MG PO TBEC
40.0000 mg | DELAYED_RELEASE_TABLET | Freq: Every day | ORAL | 0 refills | Status: DC
  Filled 2020-12-25: qty 90, 90d supply, fill #0

## 2020-12-25 MED ORDER — TRAMADOL HCL 50 MG PO TABS
50.0000 mg | ORAL_TABLET | Freq: Four times a day (QID) | ORAL | 0 refills | Status: DC | PRN
  Filled 2020-12-25: qty 10, 3d supply, fill #0

## 2020-12-25 NOTE — Progress Notes (Signed)
Patient alert and oriented, pain is controlled. Patient is tolerating fluids, advanced to protein shake today, patient is tolerating well.  Reviewed Gastric sleeve discharge instructions with patient and patient is able to articulate understanding.  Provided information on BELT program, Support Group and [facility] outpatient pharmacy. All questions answered, will continue to monitor.  

## 2020-12-25 NOTE — Progress Notes (Signed)
24hr fluid recall: 540mL.  Per dehydration protocol, will call pt to f/u within one week post op. 

## 2020-12-25 NOTE — Discharge Summary (Signed)
Physician Discharge Summary  Marisa [name] PXT:[id number] DOB: January 15, 1976 DOA: 12/24/2020  PCP: Elsie Stain, MD  Admit date: 12/24/2020 Discharge date: 12/25/2020  Recommendations for Outpatient Follow-up:    Follow-up Information     [name], MD. Go on 01/17/2021.   Specialty: General [name] Why: at 11:30am.  Please arrive 15 minutes prior to your appointment time.  Thank you. Contact information: [address] [place] [address] [phone]         [name], PA-C Follow up on 02/14/2021.   Specialty: General [name] Why: at 9:30am for Dr. [name].  Please arrive 15 minutes prior to your appointment time. Thank you. Contact information: [place] [address] [phone]                Discharge Diagnoses:  Active Problems:   Morbid obesity ([place])   Surgical Procedure: Laparoscopic Sleeve Gastrectomy, upper endoscopy  Discharge Condition: Good Disposition: Home  Diet recommendation: Postoperative sleeve gastrectomy diet (liquids only)  Filed Weights   12/24/20 0942 12/24/20 1600  Weight: 114.5 kg 114.5 kg     Hospital Course:  The patient was admitted for a planned laparoscopic sleeve gastrectomy. Please see operative note. Preoperatively the patient was given 5000 units of subcutaneous heparin for DVT prophylaxis. Postoperative prophylactic Lovenox dosing was started on the morning of postoperative day 1. ERAS protocol was used. On the evening of postoperative day 0, the patient was started on water and ice chips. On postoperative day 1 the patient was tolerating water and their diet was gradually advanced throughout the day. The patient was ambulating without difficulty. Their vital signs are stable without fever or tachycardia. Their hemoglobin had remained stable. The patient had received discharge instructions and counseling. They were deemed stable for discharge and had met  discharge criteria.   Discharge Instructions   Allergies as of 12/25/2020       Reactions   Asa [aspirin] Anaphylaxis, Hives   Hives, chest tightness   Mushroom Extract Complex Anaphylaxis, Swelling, Other (See Comments)   Reaction:  Eye swelling   Penicillins Anaphylaxis, Other (See Comments)   Has patient had a PCN reaction causing immediate rash, facial/tongue/throat swelling, SOB or lightheadedness with hypotension: Yes Has patient had a PCN reaction causing severe rash involving mucus membranes or skin necrosis: No Has patient had a PCN reaction that required hospitalization No Has patient had a PCN reaction occurring within the last 10 years: No If all of the above answers are "NO", then may proceed with Cephalosporin use.   Shellfish Allergy Anaphylaxis   Triamcinolone Other (See Comments)   Skin issues         Medication List     STOP taking these medications    atorvastatin 10 MG tablet Commonly known as: LIPITOR   dicyclomine 10 MG capsule Commonly known as: BENTYL   loratadine 10 MG tablet Commonly known as: CLARITIN   metFORMIN 500 MG tablet Commonly known as: GLUCOPHAGE   montelukast 10 MG tablet Commonly known as: SINGULAIR   Olopatadine HCl 0.2 % Soln Commonly known as: [place]   Rybelsus 7 MG Tabs Generic drug: Semaglutide   Vitamin D (Ergocalciferol) 1.25 MG (50000 UNIT) Caps capsule Commonly known as: DRISDOL       TAKE these medications    acetaminophen 500 MG tablet Commonly known as: TYLENOL Take 2 tablets (1,000 mg total) by mouth every 8 (eight) hours for 5 days.   albuterol 108 (90  Base) MCG/ACT inhaler Commonly known as: Proventil HFA Inhale 2 puffs into the lungs every 6 (six) hours as needed for wheezing or shortness of breath.   EPINEPHrine 0.3 mg/0.3 mL Soaj injection Commonly known as: EPI-PEN Inject 0.3 mg into the muscle as needed for anaphylaxis.   gabapentin 100 MG capsule Commonly known as: NEURONTIN Take 2  capsules (200 mg total) by mouth every 12 (twelve) hours. What changed:  medication strength how much to take how to take this when to take this additional instructions   ondansetron 4 MG disintegrating tablet Commonly known as: ZOFRAN-ODT Dissolve 1 tablet (4 mg total) by mouth every 6 (six) hours as needed for nausea or vomiting.   pantoprazole 40 MG tablet Commonly known as: PROTONIX Take 1 tablet (40 mg total) by mouth daily. What changed:  when to take this reasons to take this   traMADol 50 MG tablet Commonly known as: ULTRAM Take 1 tablet (50 mg total) by mouth every 6 (six) hours as needed for pain. What changed: reasons to take this   traZODone 100 MG tablet Commonly known as: DESYREL Take 100-200 mg by mouth at bedtime as needed for sleep.        Follow-up Information     [name], MD. Go on 01/17/2021.   Specialty: General [name] Why: at 11:30am.  Please arrive 15 minutes prior to your appointment time.  Thank you. Contact information: [address] [place] [address] [phone]         [name], PA-C Follow up on 02/14/2021.   Specialty: General [name] Why: at 9:30am for Dr. [name].  Please arrive 15 minutes prior to your appointment time. Thank you. Contact information: [address] [place] [address] [phone]                  The results of significant diagnostics from this hospitalization (including imaging, microbiology, ancillary and laboratory) are listed below for reference.    Significant Diagnostic Studies: No results found.  Labs: Basic Metabolic Panel: Recent Labs  Lab 12/21/20 1030 12/25/20 0408  NA 136 133*  K 4.1 4.4  CL 102 102  CO2 25 22  GLUCOSE 149* 134*  BUN 11 6  CREATININE 0.60 0.42*  CALCIUM 9.1 8.3*  MG  --  1.8   Liver Function Tests: Recent Labs  Lab 12/21/20 1030 12/24/20 1001 12/25/20 0408  AST 178* 156* 77*  ALT 129* 155* 123*   ALKPHOS 77 63 57  BILITOT 1.1 1.4* 0.6  PROT 7.6 7.7 7.3  ALBUMIN 3.9 3.7 3.5    CBC: Recent Labs  Lab 12/21/20 1030 12/25/20 0408  WBC 8.0 9.3  NEUTROABS 4.2 7.5  HGB 14.7 13.1  HCT 44.7 39.2  MCV 94.9 94.7  PLT 373 336    CBG: Recent Labs  Lab 12/21/20 1004 12/24/20 0947 12/24/20 1342  GLUCAP 155* 119* 152*    Active Problems:   Morbid obesity ([place])   Signed:  [place] [name], [place] 12/25/2020, 3:18 PM

## 2020-12-25 NOTE — Progress Notes (Signed)
S: No acute events, fair amount of subxiphoid pain with p.o. intake.  No nausea.  Does feel like there is some sticking at the very bottom of her chest.  Walking in the halls.  O: Vitals, labs, intake/output, and orders reviewed at this time.  No fever, heart rate in the 110s yesterday afternoon immediately after surgery but now normalized in the 60s.  Normotensive.  Sats 90% on room air.  P.o. intake 420.  Urine output 4100.  CMP reviewed, sodium decreased slightly at 133 from 136 preop, LFTs downtrending from preop, AST 77 (156 preop), ALT 123 (155), bilirubin 0.6 (1.4).  WBC 9.3 (8.9 preop), hemoglobin 13.1 (14.7), platelets 336 (323)   Gen: A&Ox3, no distress  H&N: EOMI, atraumatic, neck supple Chest: unlabored respirations, RRR Abd: soft, appropriately minimally tender, nondistended, incision(s) c/d/i without cellulitis or hematoma Ext: warm, no edema Neuro: grossly normal  Lines/tubes/drains: PIV  A/P: POD 1 s/p laparoscopic sleeve gastrectomy -Continue clear liquids, protein shakes -Continue SCDs, start lovenox prophylaxis (held overnight due to oozy intra-op) -Continue IS, ambulation  Plan discharge later today.   Romana Juniper, MD [facility], [place]

## 2020-12-26 ENCOUNTER — Telehealth: Payer: Self-pay

## 2020-12-26 NOTE — Telephone Encounter (Signed)
Transition Care Management Follow-up Telephone Call Date of discharge and from where: 12/25/2020, [facility] How have you been since you were released from the hospital? She said that the pain comes and goes but overall doing well.  She has been walking and following her prescribed diet. She has taken miralax for constipation.  Any questions or concerns? No  Items Reviewed: Did the pt receive and understand the discharge instructions provided? Yes  Medications obtained and verified? Yes - she said she has all medications and did not have any questions about her med regime.  Other? No  Any new allergies since your discharge? No  Dietary orders reviewed? Yes - she has been tolerating a liquid diet- Ensure, protein drinks and water.  Do you have support at home? Yes    Home Care and Equipment/Supplies: Were home health services ordered? no If so, what is the name of the agency? N/a  Has the agency set up a time to come to the patient's home? not applicable Were any new equipment or medical supplies ordered?  No What is the name of the medical supply agency? N/a Were you able to get the supplies/equipment? not applicable Do you have any questions related to the use of the equipment or supplies? No  Functional Questionnaire: (I = Independent and D = Dependent) ADLs: independent  Follow up appointments reviewed:  PCP Hospital f/u appt confirmed? Yes  Scheduled to see Roney Jaffe Clung, PA on 01/02/2021.  [facility] f/u appt confirmed? Yes  Scheduled to see surgeon on 01/17/2021.  Are transportation arrangements needed? No  If their condition worsens, is the pt aware to call PCP or go to the Emergency Dept.? Yes Was the patient provided with contact information for the PCP's office or ED? Yes Was to pt encouraged to call back with questions or concerns? Yes

## 2020-12-27 ENCOUNTER — Telehealth (INDEPENDENT_AMBULATORY_CARE_PROVIDER_SITE_OTHER): Payer: Self-pay

## 2020-12-27 MED ORDER — ACCU-CHEK SOFTCLIX LANCETS MISC: 12 refills | Status: DC

## 2020-12-27 MED ORDER — ACCU-CHEK GUIDE VI STRP: ORAL_STRIP | 12 refills | Status: DC

## 2020-12-27 MED ORDER — ACCU-CHEK GUIDE W/DEVICE KIT
PACK | 0 refills | Status: DC
Start: 2020-12-27 — End: 2022-09-04

## 2020-12-27 MED ORDER — ACCU-CHEK SOFTCLIX LANCET DEV KIT: PACK | 0 refills | Status: DC

## 2020-12-27 NOTE — Telephone Encounter (Signed)
Call placed to patient and informed her of glucometer order.  She said she is doing good, only having a " little bit of pain."  Still not had a bowel movement but took miralax yesterday. Understands to call provider if she continues with constipation.

## 2020-12-27 NOTE — Telephone Encounter (Signed)
See earlier note already sent

## 2020-12-27 NOTE — Telephone Encounter (Signed)
Copied from [place] [id number]. Topic: General - Other >> Dec 26, 2020 10:27 AM Yvette Rack wrote: Reason for CRM: Pt requests Rx for glucose monitor to be sent to [place], [place]

## 2020-12-27 NOTE — Telephone Encounter (Signed)
Order sent to [place]

## 2020-12-31 ENCOUNTER — Telehealth (HOSPITAL_COMMUNITY): Payer: Self-pay | Admitting: *Deleted

## 2020-12-31 NOTE — Telephone Encounter (Addendum)
1.  Tell me about your pain and pain management? Pt denies any pain.  2.  Let's talk about fluid intake.  How much total fluid are you taking in? Pt states that she is getting in at least 64oz of fluid including protein shakes, bottled water, and broth.  3.  How much protein have you taken in the last 2 days? Pt states she is meeting her goal of 60g of protein each day with the protein shakes.  4.  Have you had nausea?  Tell me about when have experienced nausea and what you did to help? Pt denies nausea.   5.  Has the frequency or color changed with your urine? Pt states that she is urinating "fine" with no changes in frequency or urgency.     6.  Tell me what your incisions look like? "Incisions look fine". Pt denies a fever, chills.  Pt states incisions are not swollen, open, or draining.  Pt encouraged to call [facility] if incisions change.   7.  Have you been passing gas? BM? Pt states that she is having BMs. Last BM 12/31/20.     8.  If a problem or question were to arise who would you call?  Do you know contact numbers for [place], [facility], and [facility]? Pt denies dehydration symptoms.  Pt can describe s/sx of dehydration.  Pt knows to call [facility] for surgical, [facility] for nutrition, and [place] for non-urgent questions or concerns.   9.  How has the walking going? Pt states she is walking around and able to be active without difficulty.   10. Are you still using your incentive spirometer?  If so, how often? Pt states that for the past three mornings, she was woke up with some SOB and "chest tightness".  She will then do her I.S., which helps her to cough up "yellow phlegm" and then she is "able to breath".  Pt states that she is doing her I.S. 10x every hour while awake. Pt encouraged to continue to use incentive spirometer, at least 10x every hour while awake until she sees the surgeon.  Pt denies fever, chills, or fatigue.  Pt states that she has appt with PCP on 01/02/21.  Pt does have hx of chronic  asthma, but states that she is "feeling better".  Will f/u with MD to make aware.    11.  How are your vitamins and calcium going?  How are you taking them? Pt states that she is taking her supplements and vitamins without difficulty.  Reminded patient that the first 30 days post-operatively are important for successful recovery.  Practice good hand hygiene, wearing a mask when appropriate (since optional in most places), and minimizing exposure to people who live outside of the home, especially if they are exhibiting any respiratory, GI, or illness-like symptoms.

## 2021-01-02 ENCOUNTER — Encounter: Payer: Self-pay | Admitting: Physician Assistant

## 2021-01-02 ENCOUNTER — Ambulatory Visit: Payer: Medicare Other | Attending: Physician Assistant | Admitting: Physician Assistant

## 2021-01-02 ENCOUNTER — Other Ambulatory Visit: Payer: Self-pay

## 2021-01-02 VITALS — HR 82 | Resp 16 | Wt 246.2 lb

## 2021-01-02 DIAGNOSIS — Z903 Acquired absence of stomach [part of]: Secondary | ICD-10-CM

## 2021-01-02 DIAGNOSIS — E1169 Type 2 diabetes mellitus with other specified complication: Secondary | ICD-10-CM | POA: Diagnosis not present

## 2021-01-02 DIAGNOSIS — Z09 Encounter for follow-up examination after completed treatment for conditions other than malignant neoplasm: Secondary | ICD-10-CM

## 2021-01-02 DIAGNOSIS — G43809 Other migraine, not intractable, without status migrainosus: Secondary | ICD-10-CM | POA: Diagnosis not present

## 2021-01-02 DIAGNOSIS — E1140 Type 2 diabetes mellitus with diabetic neuropathy, unspecified: Secondary | ICD-10-CM | POA: Diagnosis not present

## 2021-01-02 DIAGNOSIS — G5601 Carpal tunnel syndrome, right upper limb: Secondary | ICD-10-CM

## 2021-01-02 DIAGNOSIS — E871 Hypo-osmolality and hyponatremia: Secondary | ICD-10-CM

## 2021-01-02 DIAGNOSIS — E785 Hyperlipidemia, unspecified: Secondary | ICD-10-CM

## 2021-01-02 LAB — GLUCOSE, POCT (MANUAL RESULT ENTRY): POC Glucose: 95 mg/dl (ref 70–99)

## 2021-01-02 MED ORDER — GABAPENTIN 100 MG PO CAPS: 200.0000 mg | ORAL_CAPSULE | Freq: Two times a day (BID) | ORAL | 3 refills | Status: DC

## 2021-01-02 MED ORDER — TOPIRAMATE 25 MG PO TABS: 25.0000 mg | ORAL_TABLET | Freq: Two times a day (BID) | ORAL | 3 refills | Status: DC

## 2021-01-02 MED ORDER — AZITHROMYCIN 250 MG PO TABS: ORAL_TABLET | ORAL | 0 refills | Status: AC

## 2021-01-02 MED ORDER — ONDANSETRON 4 MG PO TBDP: 4.0000 mg | ORAL_TABLET | Freq: Four times a day (QID) | ORAL | 0 refills | Status: DC | PRN

## 2021-01-02 NOTE — Progress Notes (Signed)
Patient ID: Marisa [name], female   DOB: Mar 16, 1975, 45 y.o.   MRN: [id number]     Marisa [name], is a 45 y.o. female  HFW:[id number]  OYD:[id number]  DOB - Feb 28, 1975  Chief Complaint  Patient presents with   Numbness   Hospitalization Follow-up       Subjective:   Marisa [name] is a 45 y.o. female here today for a follow up visit  After hospitalization for gastric sleeve surgery 11/7-11/8.  She also has multiple issues.  This morning she had nausea after drinking her protein shake and vomited a little of it back up. Otherwise she has been doing well from surgery.  No abdominal pain.  See surgeon in early dec.  No fever.  Bowels moving normally.  Having "migraines"  daily for months again.  She says nothing has ever worked for this but she can't remember names of anything she's tried.  Pain starts in the back of her neck and comes around to the R front of her face.  HAs have not changed.  OTC not helping.    Also coughing up a little yellow phlegm now since surgery.  She is using incentive spirometry.  No fever.    Also c/o worsening carpal tunnel and now including thumb pain R hand.  Takes gabapentin for this and anxiety  From discharge summary: Hospital Course:  The patient was admitted for a planned laparoscopic sleeve gastrectomy. Please see operative note. Preoperatively the patient was given 5000 units of subcutaneous heparin for DVT prophylaxis. Postoperative prophylactic Lovenox dosing was started on the morning of postoperative day 1. ERAS protocol was used. On the evening of postoperative day 0, the patient was started on water and ice chips. On postoperative day 1 the patient was tolerating water and their diet was gradually advanced throughout the day. The patient was ambulating without difficulty. Their vital signs are stable without fever or tachycardia. Their hemoglobin had remained stable. The patient had received discharge instructions and  counseling. They were deemed stable for discharge and had met discharge criteria. No problems updated.  ALLERGIES: Allergies  Allergen Reactions   Asa [Aspirin] Anaphylaxis and Hives    Hives, chest tightness    Mushroom Extract Complex Anaphylaxis, Swelling and Other (See Comments)    Reaction:  Eye swelling   Penicillins Anaphylaxis and Other (See Comments)    Has patient had a PCN reaction causing immediate rash, facial/tongue/throat swelling, SOB or lightheadedness with hypotension: Yes Has patient had a PCN reaction causing severe rash involving mucus membranes or skin necrosis: No Has patient had a PCN reaction that required hospitalization No Has patient had a PCN reaction occurring within the last 10 years: No If all of the above answers are "NO", then may proceed with Cephalosporin use.   Shellfish Allergy Anaphylaxis   Triamcinolone Other (See Comments)    Skin issues     PAST MEDICAL HISTORY: Past Medical History:  Diagnosis Date   Abdominal pain 04/26/2013   Abnormal uterine bleeding (AUB) 10/11/2012   Acute bronchitis    Allergy    Anal pain    chronic   Anemia    Anxiety    Asthma    exacerbation 02-28-2014 and 02-23-2014 secondary to Rhinovirus   Atypical chest pain 04/26/2013   Benign neoplasm of sigmoid colon    Benign neoplasm of transverse colon    Carbuncle of labium 07/12/2015   Chest pain 02/20/2014   Chronic diarrhea    Chronic headaches    Chronic  low back pain    Cigarette nicotine dependence without complication 35/36/1443   Cyst of right ovary    Dandruff 03/26/2015   Diabetes mellitus without complication ([place]) 15/40/0867   Difficult intravenous access    PER PT NEEDS PICC LINE   Dyspnea 09/07/2012   [name] 08/2012:  No obstruction by FEV1%, but probable restriction.     Falls 03/27/2014   Food allergy    Mushrooms, shellfish   GAD (generalized anxiety disorder) 05/04/2014   Gait disturbance 04/11/2014   Gait instability    GERD  (gastroesophageal reflux disease)    History of adenomatous polyp of colon    History of cardiac arrest    during SVD 1992   History of ectopic pregnancy    2009-  S/P LEFT SALPINGECTOMY   History of panic attacks    Hyperlipidemia    IBS (irritable bowel syndrome)    Insomnia 03/06/2014   Joint pain    Lower extremity edema    Lumbar stenosis L4 -- L5 with bulging disk   w/ right leg weakness/ decreased mobility   Migraine variant with headache 05/09/2014   Mild obstructive sleep apnea    study 03-20-2014  no cpap recommended   Neuromuscular disorder (HCC)    neuropathy in feet    Neuropathic pain of both legs 06/04/2016   Obesity (BMI 30-39.9) 03/05/2018   OSA (obstructive sleep apnea) 03/06/2014   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   Panniculitis 03/05/2018   Pelvic pain 07/25/2013   Persistent vomiting 04/27/2013   Pneumonia    PTSD (post-traumatic stress disorder) 05/04/2014   Rash and nonspecific skin eruption 07/12/2015   Rectal bleeding 07/25/2013   RLQ abdominal pain    S/P Total vaginal hysterectomy on 01/06/13 01/06/2013   Sleep apnea    mild no cpap   Social anxiety disorder 05/04/2014   Sore throat 02/20/2014   Stomach ulcer    Tachycardia 02/20/2014   Type 2 diabetes mellitus (HCC)    Weakness of right leg    FROM BACK PROBLEM PER PT    MEDICATIONS AT HOME: Prior to Admission medications   Medication Sig Start Date End Date Taking? Authorizing Provider  Accu-Chek Softclix Lancets lancets Use as instructed 12/27/20  Yes Elsie Stain, MD  albuterol (PROVENTIL HFA) 108 (90 Base) MCG/ACT inhaler Inhale 2 puffs into the lungs every 6 (six) hours as needed for wheezing or shortness of breath. 04/12/20  Yes Elsie Stain, MD  Blood Glucose Monitoring Suppl (ACCU-CHEK GUIDE) w/Device KIT Check blood sugars daily 12/27/20  Yes Elsie Stain, MD  EPINEPHrine 0.3 mg/0.3 mL IJ SOAJ injection Inject 0.3 mg into the muscle as needed for  anaphylaxis. 05/12/20  Yes [provider]  glucose blood (ACCU-CHEK GUIDE) test strip Use as instructed 12/27/20  Yes Elsie Stain, MD  Lancets Misc. (ACCU-CHEK SOFTCLIX LANCET DEV) KIT Use to check blood sugar 12/27/20  Yes Elsie Stain, MD  pantoprazole (PROTONIX) 40 MG tablet Take 1 tablet (40 mg total) by mouth daily. 12/25/20  Yes [name], MD  topiramate (TOPAMAX) 25 MG tablet Take 1 tablet (25 mg total) by mouth 2 (two) times daily. 01/02/21  Yes Argentina Donovan, PA-C  traZODone (DESYREL) 100 MG tablet Take 100-200 mg by mouth at bedtime as needed for sleep. 08/05/20  Yes [provider]  gabapentin (NEURONTIN) 100 MG capsule Take 2 capsules (200 mg total) by mouth 2 (two) times daily. 01/02/21   Argentina Donovan, PA-C  ondansetron (ZOFRAN-ODT) 4 MG disintegrating tablet Dissolve 1 tablet (4 mg total) by mouth every 6 (six) hours as needed for nausea or vomiting. 01/02/21   Razia Screws, Dionne Bucy, PA-C    ROS: Neg HEENT Neg resp Neg cardiac See above Neg GU Neg psych Neg neuro  Objective:   Vitals:   01/02/21 1056  Pulse: 82  Resp: 16  SpO2: 97%  Weight: 246 lb 3.2 oz (111.7 kg)   Exam General appearance : Awake, alert, not in any distress. Speech Clear. Not toxic looking HEENT: Atraumatic and Normocephalic Neck: Supple, no JVD. No cervical lymphadenopathy.  Chest: Good air entry bilaterally, CTAB.  No rales/rhonchi/wheezing CVS: S1 S2 regular, no murmurs.  B hands with + phalen's R>L.  Neg tinel's.  +finkelstein on R Extremities: B/L Lower Ext shows no edema, both legs are warm to touch Neurology: Awake alert, and oriented X 3, CN II-XII intact, Non focal Skin: No Rash  Data Review Lab Results  Component Value Date   HGBA1C 7.5 (H) 12/21/2020   HGBA1C 5.7 (H) 11/25/2019   HGBA1C 5.6 09/06/2019    Assessment & Plan   1. Type 2 diabetes mellitus with diabetic neuropathy, without long-term current use of insulin (HCC) Most recent  A1C 1 week ag=7.5.  suspect with gastric sleeve surgery this will improve even more.  Vontinue current regimen and wath blood sugars closely - Glucose (CBG) - Comprehensive metabolic panel - CBC with Differential/Platelet  2. Hyperlipidemia associated with type 2 diabetes mellitus (HCC) Continue current regimen  3. Migraine variant with headache Will try- - topiramate (TOPAMAX) 25 MG tablet; Take 1 tablet (25 mg total) by mouth 2 (two) times daily.  Dispense: 60 tablet; Refill: 3  4. Carpal tunnel syndrome of right wrist Advised thumb spica for sleep since thumb is bothersome too - gabapentin (NEURONTIN) 100 MG capsule; Take 2 capsules (200 mg total) by mouth 2 (two) times daily.  Dispense: 120 capsule; Refill: 3  5. Hyponatremia - Comprehensive metabolic panel  6. S/P gastric sleeve procedure F/up with surgeon if any further nausea or vomiting, pain, etc.  Patient verbalizes understanding - ondansetron (ZOFRAN-ODT) 4 MG disintegrating tablet; Dissolve 1 tablet (4 mg total) by mouth every 6 (six) hours as needed for nausea or vomiting.  Dispense: 20 tablet; Refill: 0  7. Hospital discharge follow-up [facility] CTA B-sent Rx azithromycin to cover for atypicals   Patient have been counseled extensively about nutrition and exercise. Other issues discussed during this visit include: low cholesterol diet, weight control and daily exercise, foot care, annual eye examinations at Ophthalmology, importance of adherence with medications and regular follow-up. We also discussed long term complications of uncontrolled diabetes and hypertension.   Return in about 2 months (around 03/04/2021) for Dr Joya Gaskins in 2 months/chronic conditions.  The patient was given clear instructions to go to ER or return to medical center if symptoms don't improve, worsen or new problems develop. The patient verbalized understanding. The patient was told to call to get lab results if they haven't heard anything in the next  week.      Freeman Caldron, PA-C [facility] [place], [place]   01/02/2021, 11:37 AM

## 2021-01-03 LAB — CBC WITH DIFFERENTIAL/PLATELET
Basophils Absolute: 0.1 10*3/uL (ref 0.0–0.2)
Basos: 1 %
EOS (ABSOLUTE): 0.2 10*3/uL (ref 0.0–0.4)
Eos: 4 %
Hematocrit: 43.4 % (ref 34.0–46.6)
Hemoglobin: 14.6 g/dL (ref 11.1–15.9)
Immature Grans (Abs): 0 10*3/uL (ref 0.0–0.1)
Immature Granulocytes: 0 %
Lymphocytes Absolute: 3 10*3/uL (ref 0.7–3.1)
Lymphs: 47 %
MCH: 30.9 pg (ref 26.6–33.0)
MCHC: 33.6 g/dL (ref 31.5–35.7)
MCV: 92 fL (ref 79–97)
Monocytes Absolute: 0.6 10*3/uL (ref 0.1–0.9)
Monocytes: 9 %
Neutrophils Absolute: 2.5 10*3/uL (ref 1.4–7.0)
Neutrophils: 39 %
Platelets: 433 10*3/uL (ref 150–450)
RBC: 4.72 x10E6/uL (ref 3.77–5.28)
RDW: 11.9 % (ref 11.7–15.4)
WBC: 6.4 10*3/uL (ref 3.4–10.8)

## 2021-01-03 LAB — COMPREHENSIVE METABOLIC PANEL
ALT: 139 IU/L — ABNORMAL HIGH (ref 0–32)
AST: 101 IU/L — ABNORMAL HIGH (ref 0–40)
Albumin/Globulin Ratio: 1.6 (ref 1.2–2.2)
Albumin: 4.6 g/dL (ref 3.8–4.8)
Alkaline Phosphatase: 77 IU/L (ref 44–121)
BUN/Creatinine Ratio: 14 (ref 9–23)
BUN: 10 mg/dL (ref 6–24)
Bilirubin Total: 0.4 mg/dL (ref 0.0–1.2)
CO2: 23 mmol/L (ref 20–29)
Calcium: 10 mg/dL (ref 8.7–10.2)
Chloride: 100 mmol/L (ref 96–106)
Creatinine, Ser: 0.73 mg/dL (ref 0.57–1.00)
Globulin, Total: 2.9 g/dL (ref 1.5–4.5)
Glucose: 85 mg/dL (ref 70–99)
Potassium: 4.3 mmol/L (ref 3.5–5.2)
Sodium: 141 mmol/L (ref 134–144)
Total Protein: 7.5 g/dL (ref 6.0–8.5)
eGFR: 103 mL/min/{1.73_m2} (ref >59)

## 2021-01-04 ENCOUNTER — Emergency Department (HOSPITAL_COMMUNITY): Payer: Medicare Other

## 2021-01-04 ENCOUNTER — Encounter (HOSPITAL_COMMUNITY): Payer: Self-pay

## 2021-01-04 ENCOUNTER — Inpatient Hospital Stay (HOSPITAL_COMMUNITY)
Admission: EM | Admit: 2021-01-04 | Discharge: 2021-01-07 | DRG: 392 | Disposition: A | Discharge status: Home / Self Care | Payer: Medicare Other | Attending: Surgery | Admitting: Surgery

## 2021-01-04 ENCOUNTER — Other Ambulatory Visit: Payer: Self-pay

## 2021-01-04 DIAGNOSIS — Z6841 Body Mass Index (BMI) 40.0 and over, adult: Secondary | ICD-10-CM

## 2021-01-04 DIAGNOSIS — E119 Type 2 diabetes mellitus without complications: Secondary | ICD-10-CM | POA: Diagnosis present

## 2021-01-04 DIAGNOSIS — Z888 Allergy status to other drugs, medicaments and biological substances status: Secondary | ICD-10-CM | POA: Diagnosis not present

## 2021-01-04 DIAGNOSIS — R112 Nausea with vomiting, unspecified: Secondary | ICD-10-CM | POA: Diagnosis present

## 2021-01-04 DIAGNOSIS — R519 Headache, unspecified: Secondary | ICD-10-CM | POA: Diagnosis present

## 2021-01-04 DIAGNOSIS — Z20822 Contact with and (suspected) exposure to covid-19: Secondary | ICD-10-CM | POA: Diagnosis present

## 2021-01-04 DIAGNOSIS — Z886 Allergy status to analgesic agent status: Secondary | ICD-10-CM | POA: Diagnosis not present

## 2021-01-04 DIAGNOSIS — Z9884 Bariatric surgery status: Secondary | ICD-10-CM

## 2021-01-04 DIAGNOSIS — Z880 Allergy status to penicillin: Secondary | ICD-10-CM | POA: Diagnosis not present

## 2021-01-04 DIAGNOSIS — R1013 Epigastric pain: Principal | ICD-10-CM | POA: Diagnosis present

## 2021-01-04 DIAGNOSIS — R7401 Elevation of levels of liver transaminase levels: Secondary | ICD-10-CM | POA: Diagnosis present

## 2021-01-04 DIAGNOSIS — R300 Dysuria: Secondary | ICD-10-CM | POA: Diagnosis present

## 2021-01-04 DIAGNOSIS — Z800 Family history of malignant neoplasm of digestive organs: Secondary | ICD-10-CM | POA: Diagnosis not present

## 2021-01-04 DIAGNOSIS — Z803 Family history of malignant neoplasm of breast: Secondary | ICD-10-CM | POA: Diagnosis not present

## 2021-01-04 DIAGNOSIS — R911 Solitary pulmonary nodule: Secondary | ICD-10-CM | POA: Diagnosis present

## 2021-01-04 DIAGNOSIS — Z87891 Personal history of nicotine dependence: Secondary | ICD-10-CM | POA: Diagnosis not present

## 2021-01-04 DIAGNOSIS — Z903 Acquired absence of stomach [part of]: Secondary | ICD-10-CM

## 2021-01-04 DIAGNOSIS — K9189 Other postprocedural complications and disorders of digestive system: Secondary | ICD-10-CM

## 2021-01-04 DIAGNOSIS — G8918 Other acute postprocedural pain: Secondary | ICD-10-CM

## 2021-01-04 DIAGNOSIS — Z833 Family history of diabetes mellitus: Secondary | ICD-10-CM

## 2021-01-04 DIAGNOSIS — Z8349 Family history of other endocrine, nutritional and metabolic diseases: Secondary | ICD-10-CM

## 2021-01-04 DIAGNOSIS — Z823 Family history of stroke: Secondary | ICD-10-CM

## 2021-01-04 DIAGNOSIS — E785 Hyperlipidemia, unspecified: Secondary | ICD-10-CM | POA: Diagnosis present

## 2021-01-04 DIAGNOSIS — Z841 Family history of disorders of kidney and ureter: Secondary | ICD-10-CM

## 2021-01-04 DIAGNOSIS — R0602 Shortness of breath: Secondary | ICD-10-CM

## 2021-01-04 DIAGNOSIS — Z832 Family history of diseases of the blood and blood-forming organs and certain disorders involving the immune mechanism: Secondary | ICD-10-CM | POA: Diagnosis not present

## 2021-01-04 DIAGNOSIS — R918 Other nonspecific abnormal finding of lung field: Secondary | ICD-10-CM

## 2021-01-04 LAB — CBC WITH DIFFERENTIAL/PLATELET
Abs Immature Granulocytes: 0.02 10*3/uL (ref 0.00–0.07)
Basophils Absolute: 0.1 10*3/uL (ref 0.0–0.1)
Basophils Relative: 1 %
Eosinophils Absolute: 0.2 10*3/uL (ref 0.0–0.5)
Eosinophils Relative: 3 %
HCT: 44 % (ref 36.0–46.0)
Hemoglobin: 14.4 g/dL (ref 12.0–15.0)
Immature Granulocytes: 0 %
Lymphocytes Relative: 48 %
Lymphs Abs: 3.2 10*3/uL (ref 0.7–4.0)
MCH: 31.4 pg (ref 26.0–34.0)
MCHC: 32.7 g/dL (ref 30.0–36.0)
MCV: 95.9 fL (ref 80.0–100.0)
Monocytes Absolute: 0.6 10*3/uL (ref 0.1–1.0)
Monocytes Relative: 9 %
Neutro Abs: 2.6 10*3/uL (ref 1.7–7.7)
Neutrophils Relative %: 39 %
Platelets: 408 10*3/uL — ABNORMAL HIGH (ref 150–400)
RBC: 4.59 MIL/uL (ref 3.87–5.11)
RDW: 11.9 % (ref 11.5–15.5)
WBC: 6.6 10*3/uL (ref 4.0–10.5)
nRBC: 0 % (ref 0.0–0.2)

## 2021-01-04 LAB — COMPREHENSIVE METABOLIC PANEL
ALT: 114 U/L — ABNORMAL HIGH (ref 0–44)
AST: 75 U/L — ABNORMAL HIGH (ref 15–41)
Albumin: 4.4 g/dL (ref 3.5–5.0)
Alkaline Phosphatase: 68 U/L (ref 38–126)
Anion gap: 14 (ref 5–15)
BUN: 10 mg/dL (ref 6–20)
CO2: 24 mmol/L (ref 22–32)
Calcium: 9.4 mg/dL (ref 8.9–10.3)
Chloride: 101 mmol/L (ref 98–111)
Creatinine, Ser: 0.69 mg/dL (ref 0.44–1.00)
GFR, Estimated: >60 mL/min (ref >60)
Glucose, Bld: 76 mg/dL (ref 70–99)
Potassium: 3.9 mmol/L (ref 3.5–5.1)
Sodium: 139 mmol/L (ref 135–145)
Total Bilirubin: 1 mg/dL (ref 0.3–1.2)
Total Protein: 8.4 g/dL — ABNORMAL HIGH (ref 6.5–8.1)

## 2021-01-04 LAB — LIPASE, BLOOD: Lipase: 44 U/L (ref 11–51)

## 2021-01-04 LAB — URINALYSIS, ROUTINE W REFLEX MICROSCOPIC
Bilirubin Urine: NEGATIVE
Glucose, UA: NEGATIVE mg/dL
Hgb urine dipstick: NEGATIVE
Ketones, ur: 80 mg/dL — AB
Leukocytes,Ua: NEGATIVE
Nitrite: NEGATIVE
Protein, ur: NEGATIVE mg/dL
Specific Gravity, Urine: 1.041 — ABNORMAL HIGH (ref 1.005–1.030)
pH: 5 (ref 5.0–8.0)

## 2021-01-04 LAB — GLUCOSE, CAPILLARY: Glucose-Capillary: 84 mg/dL (ref 70–99)

## 2021-01-04 LAB — PREGNANCY, URINE: Preg Test, Ur: NEGATIVE

## 2021-01-04 LAB — RESP PANEL BY RT-PCR (FLU A&B, COVID) ARPGX2
Influenza A by PCR: NEGATIVE
Influenza B by PCR: NEGATIVE
SARS Coronavirus 2 by RT PCR: NEGATIVE

## 2021-01-04 LAB — CBG MONITORING, ED: Glucose-Capillary: 70 mg/dL (ref 70–99)

## 2021-01-04 LAB — TROPONIN I (HIGH SENSITIVITY): Troponin I (High Sensitivity): 4 ng/L (ref <18)

## 2021-01-04 MED ORDER — PROCHLORPERAZINE MALEATE 10 MG PO TABS
10.0000 mg | ORAL_TABLET | Freq: Four times a day (QID) | ORAL | Status: DC | PRN
  Filled 2021-01-04: qty 1

## 2021-01-04 MED ORDER — MORPHINE SULFATE (PF) 4 MG/ML IV SOLN
4.0000 mg | Freq: Once | INTRAVENOUS | Status: AC
  Administered 2021-01-04: 4 mg via INTRAVENOUS
  Filled 2021-01-04: qty 1

## 2021-01-04 MED ORDER — ALBUTEROL SULFATE (2.5 MG/3ML) 0.083% IN NEBU: 2.5000 mg | INHALATION_SOLUTION | Freq: Four times a day (QID) | RESPIRATORY_TRACT | Status: DC | PRN

## 2021-01-04 MED ORDER — IOHEXOL 350 MG/ML SOLN
80.0000 mL | Freq: Once | INTRAVENOUS | Status: AC | PRN
  Administered 2021-01-04: 80 mL via INTRAVENOUS

## 2021-01-04 MED ORDER — SODIUM CHLORIDE 0.9 % IV SOLN: INTRAVENOUS | Status: DC

## 2021-01-04 MED ORDER — HYDROMORPHONE HCL 1 MG/ML IJ SOLN
1.0000 mg | Freq: Once | INTRAMUSCULAR | Status: AC
  Administered 2021-01-04: 1 mg via INTRAVENOUS
  Filled 2021-01-04: qty 1

## 2021-01-04 MED ORDER — ONDANSETRON HCL 4 MG/2ML IJ SOLN
4.0000 mg | Freq: Four times a day (QID) | INTRAMUSCULAR | Status: DC | PRN
  Administered 2021-01-05 – 2021-01-07 (×3): 4 mg via INTRAVENOUS
  Filled 2021-01-04 (×3): qty 2

## 2021-01-04 MED ORDER — LACTATED RINGERS IV BOLUS
1000.0000 mL | Freq: Once | INTRAVENOUS | Status: AC
  Administered 2021-01-04: 1000 mL via INTRAVENOUS

## 2021-01-04 MED ORDER — INSULIN ASPART 100 UNIT/ML IJ SOLN
0.0000 [IU] | Freq: Every day | INTRAMUSCULAR | Status: DC
  Filled 2021-01-04: qty 0.05

## 2021-01-04 MED ORDER — PANTOPRAZOLE SODIUM 40 MG IV SOLR
40.0000 mg | Freq: Every day | INTRAVENOUS | Status: DC
  Administered 2021-01-04 – 2021-01-06 (×3): 40 mg via INTRAVENOUS
  Filled 2021-01-04 (×3): qty 40

## 2021-01-04 MED ORDER — INSULIN ASPART 100 UNIT/ML IJ SOLN
0.0000 [IU] | Freq: Three times a day (TID) | INTRAMUSCULAR | Status: DC
  Filled 2021-01-04: qty 0.15

## 2021-01-04 MED ORDER — ONDANSETRON HCL 4 MG/2ML IJ SOLN
4.0000 mg | Freq: Once | INTRAMUSCULAR | Status: AC
  Administered 2021-01-04: 4 mg via INTRAVENOUS
  Filled 2021-01-04: qty 2

## 2021-01-04 MED ORDER — ACETAMINOPHEN 650 MG RE SUPP: 650.0000 mg | Freq: Four times a day (QID) | RECTAL | Status: DC | PRN

## 2021-01-04 MED ORDER — HYDROMORPHONE HCL 1 MG/ML IJ SOLN
0.5000 mg | INTRAMUSCULAR | Status: DC | PRN
  Administered 2021-01-04 – 2021-01-07 (×14): 0.5 mg via INTRAVENOUS
  Filled 2021-01-04 (×14): qty 0.5

## 2021-01-04 MED ORDER — METOPROLOL TARTRATE 5 MG/5ML IV SOLN: 5.0000 mg | Freq: Four times a day (QID) | INTRAVENOUS | Status: DC | PRN

## 2021-01-04 MED ORDER — ENOXAPARIN SODIUM 40 MG/0.4ML IJ SOSY
40.0000 mg | PREFILLED_SYRINGE | INTRAMUSCULAR | Status: DC
  Administered 2021-01-04 – 2021-01-06 (×3): 40 mg via SUBCUTANEOUS
  Filled 2021-01-04 (×3): qty 0.4

## 2021-01-04 MED ORDER — ACETAMINOPHEN 325 MG PO TABS: 650.0000 mg | ORAL_TABLET | Freq: Four times a day (QID) | ORAL | Status: DC | PRN

## 2021-01-04 MED ORDER — ONDANSETRON 4 MG PO TBDP: 4.0000 mg | ORAL_TABLET | Freq: Four times a day (QID) | ORAL | Status: DC | PRN

## 2021-01-04 MED ORDER — PROCHLORPERAZINE EDISYLATE 10 MG/2ML IJ SOLN
5.0000 mg | Freq: Four times a day (QID) | INTRAMUSCULAR | Status: DC | PRN
  Administered 2021-01-05: 10 mg via INTRAVENOUS
  Filled 2021-01-04: qty 2

## 2021-01-04 NOTE — ED Provider Notes (Signed)
[place] DEPT Provider Note   CSN: [id number] Arrival date & time: 01/04/21  1201     History Chief Complaint  Patient presents with   Post-op Problem   Vomiting    Marisa [name] is a 45 y.o. female.  HPI     45 year old female with a history of asthma, diabetes, hyperlipidemia, PTSD, anxiety, obesity, recent laparoscopic sleeve gastrectomy on November 7 with Dr. Windle Guard, who presents with concern for abdominal pain, nausea and vomiting.  Reports that 3 days ago, she developed severe abdominal pain, nausea and vomiting.  Reports she has been vomiting about 2 times per day.  Her last bowel movement was 3 days ago.  Reports she is only had 3 bowel movement since her surgery.  She has been passing flatus, but has not had a bowel movement over the last 3 days.  The pain is severe, located periumbilically towards the right side and right upper quadrant.  Denies any hematemesis, black or bloody stools, fevers.  Reports that she is also had shortness of breath and chest pain that feels like a chest tightness over the last 3 days.  Also reports a headache that is 7 out of 10.  Denies any other body aches, sick contacts fever, sore throat.  Also acknowledges some dysuria.  Past Medical History:  Diagnosis Date   Abdominal pain 04/26/2013   Abnormal uterine bleeding (AUB) 10/11/2012   Acute bronchitis    Allergy    Anal pain    chronic   Anemia    Anxiety    Asthma    exacerbation 02-28-2014 and 02-23-2014 secondary to Rhinovirus   Atypical chest pain 04/26/2013   Benign neoplasm of sigmoid colon    Benign neoplasm of transverse colon    Carbuncle of labium 07/12/2015   Chest pain 02/20/2014   Chronic diarrhea    Chronic headaches    Chronic low back pain    Cigarette nicotine dependence without complication 27/04/5007   Cyst of right ovary    Dandruff 03/26/2015   Diabetes mellitus without complication ([place]) 38/18/2993   Difficult  intravenous access    PER PT NEEDS PICC LINE   Dyspnea 09/07/2012   [name] 08/2012:  No obstruction by FEV1%, but probable restriction.     Falls 03/27/2014   Food allergy    Mushrooms, shellfish   GAD (generalized anxiety disorder) 05/04/2014   Gait disturbance 04/11/2014   Gait instability    GERD (gastroesophageal reflux disease)    History of adenomatous polyp of colon    History of cardiac arrest    during SVD 1992   History of ectopic pregnancy    2009-  S/P LEFT SALPINGECTOMY   History of panic attacks    Hyperlipidemia    IBS (irritable bowel syndrome)    Insomnia 03/06/2014   Joint pain    Lower extremity edema    Lumbar stenosis L4 -- L5 with bulging disk   w/ right leg weakness/ decreased mobility   Migraine variant with headache 05/09/2014   Mild obstructive sleep apnea    study 03-20-2014  no cpap recommended   Neuromuscular disorder (HCC)    neuropathy in feet    Neuropathic pain of both legs 06/04/2016   Obesity (BMI 30-39.9) 03/05/2018   OSA (obstructive sleep apnea) 03/06/2014   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   Panniculitis 03/05/2018   Pelvic pain 07/25/2013   Persistent vomiting 04/27/2013   Pneumonia    PTSD (post-traumatic stress disorder) 05/04/2014  Rash and nonspecific skin eruption 07/12/2015   Rectal bleeding 07/25/2013   RLQ abdominal pain    S/P Total vaginal hysterectomy on 01/06/13 01/06/2013   Sleep apnea    mild no cpap   Social anxiety disorder 05/04/2014   Sore throat 02/20/2014   Stomach ulcer    Tachycardia 02/20/2014   Type 2 diabetes mellitus (HCC)    Weakness of right leg    FROM BACK PROBLEM PER PT    Patient Active Problem List   Diagnosis Date Noted   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   Morbid obesity ([place]) 12/24/2020   Allergic rhinitis 06/26/2020   Hyperlipidemia associated with type 2 diabetes mellitus ([place]) 04/10/2020   Chronic pain syndrome 04/09/2020   Type 2 diabetes mellitus with  diabetic neuropathy, without long-term current use of insulin ([place]) 04/09/2020   Diabetic neuropathy, painful ([place]) 12/13/2019   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 12/13/2019   Vitamin D deficiency 04/05/2019   Class 2 severe obesity with serious comorbidity and body mass index (BMI) of 36.0 to 36.9 in adult ([place]) 04/05/2019   Obesity (BMI 30-39.9) 03/05/2018   Chronic diarrhea    Benign neoplasm of transverse colon    Benign neoplasm of sigmoid colon    Neuropathic pain of both legs 06/04/2016   Rash and nonspecific skin eruption 07/12/2015   Dandruff 03/26/2015   Migraine variant with headache 05/09/2014   Generalized anxiety disorder 05/04/2014   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   Social anxiety disorder 05/04/2014   PTSD (post-traumatic stress disorder) 05/04/2014   Depression 03/27/2014   OSA (obstructive sleep apnea) 03/06/2014   Insomnia 03/06/2014   History of cardiac arrest    S/P Total vaginal hysterectomy on 01/06/13 01/06/2013   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    Past Surgical History:  Procedure Laterality Date   ABDOMINAL HYSTERECTOMY     partial   COLONOSCOPY Left 04/29/2013   Procedure: COLONOSCOPY;  Surgeon: Arta Silence, MD;  Location: [facility];  Service: Endoscopy;  Laterality: Left;   COLONOSCOPY     COLONOSCOPY WITH PROPOFOL N/A 08/01/2016   Procedure: COLONOSCOPY WITH PROPOFOL;  Surgeon: Doran Stabler, MD;  Location: [facility];  Service: Gastroenterology;  Laterality: N/A;   ECTOPIC PREGNANCY SURGERY     ESOPHAGOGASTRODUODENOSCOPY (EGD) WITH PROPOFOL N/A 11/10/2018   Procedure: ESOPHAGOGASTRODUODENOSCOPY (EGD) WITH PROPOFOL;  Surgeon: Doran Stabler, MD;  Location: [facility];  Service: Gastroenterology;  Laterality: N/A;   EVALUATION UNDER ANESTHESIA WITH FISTULECTOMY N/A 04/20/2014   Procedure: EXAM UNDER ANESTHESIA ;  Surgeon: Leighton Ruff, MD;  Location: [facility];  Service: General;   Laterality: N/A;   FLEXIBLE SIGMOIDOSCOPY N/A 11/09/2013   Procedure: FLEXIBLE SIGMOIDOSCOPY;  Surgeon: Arta Silence, MD;  Location: [facility];  Service: Endoscopy;  Laterality: N/A;   fupa removal      LAPAROSCOPIC CHOLECYSTECTOMY  2005   LAPAROSCOPIC GASTRIC SLEEVE RESECTION N/A 12/24/2020   Procedure: LAPAROSCOPIC GASTRIC SLEEVE RESECTION;  Surgeon: [name], MD;  Location: [facility];  Service: General;  Laterality: N/A;   REFRACTIVE SURGERY     SPHINCTEROTOMY N/A 04/20/2014   Procedure:  LATERAL INTERNAL SPHINCTEROTOMY;  Surgeon: Leighton Ruff, MD;  Location: [facility];  Service: General;  Laterality: N/A;   TRANSTHORACIC ECHOCARDIOGRAM  12/30/2012   mild LVH/  ef 55-60%   UNILATERAL SALPINGECTOMY  2009   laparotomy left salpingectomy-- ectopic preg.   UPPER GASTROINTESTINAL ENDOSCOPY     UPPER GI ENDOSCOPY N/A  12/24/2020   Procedure: UPPER GI ENDOSCOPY;  Surgeon: [name], MD;  Location: [facility];  Service: General;  Laterality: N/A;   VAGINAL HYSTERECTOMY N/A 01/06/2013   Procedure: HYSTERECTOMY VAGINAL;  Surgeon: Osborne Oman, MD;  Location: [facility];  Service: Gynecology;  Laterality: N/A;     OB History     Gravida  3   Para  1   Term  1   Preterm      AB  2   Living  1      SAB      IAB  1   Ectopic  1   Multiple      Live Births              Family History  Problem Relation Age of Onset   Hypertension Mother    Diabetes Mother    Allergies Mother    Heart disease Mother    Clotting disorder Mother    Stroke Mother    Kidney disease Mother    Thyroid disease Mother    Cancer Father    Hyperlipidemia Father    Hypertension Father    Colon cancer Father    Liver disease Father    Heart disease Maternal Grandmother    Breast cancer Maternal Grandmother 63   Schizophrenia Sister    Bipolar disorder Sister    Clotting disorder Sister    Bipolar disorder Brother    Kidney disease Brother    Bipolar  disorder Sister    Pancreatic cancer Maternal Aunt    Prostate cancer Maternal Uncle    Liver cancer Maternal Grandfather    Rectal cancer Maternal Grandfather    Liver cancer Paternal Grandfather    Colon polyps Neg Hx    Esophageal cancer Neg Hx    Stomach cancer Neg Hx     Social History   Tobacco Use   Smoking status: Former    Packs/day: 0.20    Years: 11.00    Pack years: 2.20    Types: Cigarettes    Quit date: 11/17/2013    Years since quitting: 7.1   Smokeless tobacco: Never   Tobacco comments:    2 years quit  Vaping Use   Vaping Use: Never used  Substance Use Topics   Alcohol use: No    Alcohol/week: 0.0 standard drinks   Drug use: No    Home Medications Prior to Admission medications   Medication Sig Start Date End Date Taking? Authorizing Provider  albuterol (PROVENTIL HFA) 108 (90 Base) MCG/ACT inhaler Inhale 2 puffs into the lungs every 6 (six) hours as needed for wheezing or shortness of breath. 04/12/20  Yes Elsie Stain, MD  azithromycin (ZITHROMAX) 250 MG tablet Take 2 tablets on day 1, then 1 tablet daily on days 2 through 5 Patient taking differently: Take 250 mg by mouth once. Take 2 tablets on day 1, then 1 tablet daily on days 2 through 5 01/02/21 01/07/21 Yes McClung, Dionne Bucy, PA-C  EPINEPHrine 0.3 mg/0.3 mL IJ SOAJ injection Inject 0.3 mg into the muscle as needed for anaphylaxis. 05/12/20  Yes [provider]  gabapentin (NEURONTIN) 100 MG capsule Take 2 capsules (200 mg total) by mouth 2 (two) times daily. 01/02/21  Yes Freeman Caldron M, PA-C  ondansetron (ZOFRAN-ODT) 4 MG disintegrating tablet Dissolve 1 tablet (4 mg total) by mouth every 6 (six) hours as needed for nausea or vomiting. 01/02/21  Yes Freeman Caldron M, PA-C  pantoprazole (PROTONIX) 40  MG tablet Take 1 tablet (40 mg total) by mouth daily. 12/25/20  Yes [name], MD  topiramate (TOPAMAX) 25 MG tablet Take 1 tablet (25 mg total) by mouth 2 (two) times daily.  01/02/21  Yes Argentina Donovan, PA-C  traZODone (DESYREL) 100 MG tablet Take 100-200 mg by mouth at bedtime as needed for sleep. 08/05/20  Yes [provider]  Accu-Chek Softclix Lancets lancets Use as instructed 12/27/20   Elsie Stain, MD  Blood Glucose Monitoring Suppl (ACCU-CHEK GUIDE) w/Device KIT Check blood sugars daily 12/27/20   Elsie Stain, MD  glucose blood (ACCU-CHEK GUIDE) test strip Use as instructed 12/27/20   Elsie Stain, MD  Lancets Misc. (ACCU-CHEK SOFTCLIX LANCET DEV) KIT Use to check blood sugar 12/27/20   Elsie Stain, MD    Allergies    Asa [aspirin], Mushroom extract complex, Penicillins, Shellfish allergy, and Triamcinolone  Review of Systems   Review of Systems  Constitutional:  Negative for fever.  HENT:  Negative for congestion and sore throat.   Respiratory:  Positive for cough, chest tightness and shortness of breath.   Cardiovascular:  Positive for chest pain.  Gastrointestinal:  Positive for abdominal pain, constipation, nausea and vomiting. Negative for diarrhea.  Genitourinary:  Positive for dysuria.  Musculoskeletal:  Negative for back pain.  Skin:  Negative for rash.  Neurological:  Positive for headaches.   Physical Exam Updated Vital Signs BP 101/75   Pulse 67   Temp 98 F (36.7 C) (Oral)   Resp 17   Ht '5\' 5"'  (1.651 m)   Wt 111.8 kg   LMP 11/27/2012   SpO2 98%   BMI 41.02 kg/m   Physical Exam Vitals and nursing note reviewed.  Constitutional:      General: She is not in acute distress.    Appearance: She is well-developed. She is not diaphoretic.  HENT:     Head: Normocephalic and atraumatic.  Eyes:     Conjunctiva/sclera: Conjunctivae normal.  Cardiovascular:     Rate and Rhythm: Normal rate and regular rhythm.     Heart sounds: Normal heart sounds. No murmur heard.   No friction rub. No gallop.  Pulmonary:     Effort: Pulmonary effort is normal. No respiratory distress.     Breath sounds: Normal  breath sounds. No wheezing or rales.  Abdominal:     General: There is no distension.     Palpations: Abdomen is soft.     Tenderness: There is abdominal tenderness. There is guarding (RLQ).  Musculoskeletal:        General: No tenderness.     Cervical back: Normal range of motion.  Skin:    General: Skin is warm and dry.     Findings: No erythema or rash.  Neurological:     Mental Status: She is alert and oriented to person, place, and time.    ED Results / Procedures / Treatments   Labs (all labs ordered are listed, but only abnormal results are displayed) Labs Reviewed  COMPREHENSIVE METABOLIC PANEL - Abnormal; Notable for the following components:      Result Value   Total Protein 8.4 (*)    AST 75 (*)    ALT 114 (*)    All other components within normal limits  CBC WITH DIFFERENTIAL/PLATELET - Abnormal; Notable for the following components:   Platelets 408 (*)    All other components within normal limits  URINALYSIS, ROUTINE W REFLEX MICROSCOPIC - Abnormal; Notable  for the following components:   Specific Gravity, Urine 1.041 (*)    Ketones, ur 80 (*)    All other components within normal limits  RESP PANEL BY RT-PCR (FLU A&B, COVID) ARPGX2  LIPASE, BLOOD  PREGNANCY, URINE  BASIC METABOLIC PANEL  CBC  CBG MONITORING, ED  TROPONIN I (HIGH SENSITIVITY)    EKG EKG Interpretation  Date/Time:  Friday January 04 2021 15:48:55 EST Ventricular Rate:  69 PR Interval:  144 QRS Duration: 78 QT Interval:  394 QTC Calculation: 423 R Axis:   -1 Text Interpretation: Sinus rhythm Inferior infarct, age indeterminate No significant change since last tracing Confirmed by Gareth Morgan [phone]) on 01/04/2021 8:44:35 PM  Radiology DG Chest 2 View  Result Date: 01/04/2021 CLINICAL DATA:  Postop gastric sleeve 12/24/2020, shortness of breath EXAM: CHEST - 2 VIEW COMPARISON:  03/12/2020 FINDINGS: The heart size and mediastinal contours are within normal limits. Both lungs are  clear. The visualized skeletal structures are unremarkable. IMPRESSION: No active cardiopulmonary disease. Electronically Signed   By: Jerilynn Mages.  Shick M.D.   On: 01/04/2021 13:17   CT Angio Chest PE W and/or Wo Contrast  Result Date: 01/04/2021 CLINICAL DATA:  Concern for pulmonary embolism EXAM: CT ANGIOGRAPHY CHEST WITH CONTRAST TECHNIQUE: Multidetector CT imaging of the chest was performed using the standard protocol during bolus administration of intravenous contrast. Multiplanar CT image reconstructions and MIPs were obtained to evaluate the vascular anatomy. CONTRAST:  52m OMNIPAQUE IOHEXOL 350 MG/ML SOLN COMPARISON:  None. FINDINGS: Cardiovascular: No filling defects within the pulmonary arteries to suggest acute pulmonary embolism. Mediastinum/Nodes: No axillary or supraclavicular adenopathy. No mediastinal or hilar adenopathy. No pericardial fluid. Esophagus normal. Lungs/Pleura: 9 mm nodule in the medial recent 9 mm nodule in the lateral aspect of the LEFT lower lobe (image 82/6) No pulmonary infarction.  No pneumonia. Upper Abdomen: Limited view of the liver, kidneys, pancreas are unremarkable. Normal adrenal glands. Musculoskeletal: No chest wall abnormality. No acute or significant osseous findings. Review of the MIP images confirms the above findings. IMPRESSION: 1. No evidence acute pulmonary embolism. 2. No pneumonia or pulmonary infarction. 3. LEFT lobe pulmonary nodule. Recommend a non-contrast Chest CT at 3 months. These guidelines do not apply to immunocompromised patients and patients with cancer. Follow up in patients with significant comorbidities as clinically warranted. For lung cancer screening, adhere to Lung-RADS guidelines. Reference: Radiology. 2017; 284(1):228-43. Electronically Signed   By: SSuzy BouchardM.D.   On: 01/04/2021 15:35   CT ABDOMEN PELVIS W CONTRAST  Result Date: 01/04/2021 CLINICAL DATA:  Sleeve gastrectomy on 12/24/2020. Evaluate for bowel obstruction. Abdominal  pain. EXAM: CT ABDOMEN AND PELVIS WITH CONTRAST TECHNIQUE: Multidetector CT imaging of the abdomen and pelvis was performed using the standard protocol following bolus administration of intravenous contrast. CONTRAST:  849mOMNIPAQUE IOHEXOL 350 MG/ML SOLN COMPARISON:  CT abdomen and pelvis 03/02/2020. FINDINGS: Lower chest: Nodular density is seen in the lingula measuring 1 cm, new from prior. Hepatobiliary: No focal liver abnormality is seen. Status post cholecystectomy. No biliary dilatation. Pancreas: Unremarkable. No pancreatic ductal dilatation or surrounding inflammatory changes. Spleen: Normal in size without focal abnormality. Adrenals/Urinary Tract: Adrenal glands are unremarkable. Kidneys are normal, without renal calculi, focal lesion, or hydronephrosis. Bladder is unremarkable. Stomach/Bowel: There are postsurgical changes in the proximal stomach. There is a small extraluminal fluid collection adjacent to the staple line measuring 3.0 x 2.1 x 2.2 cm. There is no wall enhancement, air or surrounding inflammatory stranding in this region. There are no  dilated bowel loops. There is no wall thickening. There is diffuse colonic diverticulosis without evidence for diverticulitis. The appendix is within normal limits. Vascular/Lymphatic: No significant vascular findings are present. No enlarged abdominal or pelvic lymph nodes. Reproductive: Status post hysterectomy. No adnexal masses. Follicular cysts are seen in both ovaries measuring up to 2.5 cm. Other: No abdominal wall hernia or abnormality. No abdominopelvic ascites. There is mild subcutaneous stranding in the right anterior abdominal wall likely related to recent surgery. There are no abdominal wall fluid collections. Musculoskeletal: No acute or significant osseous findings. IMPRESSION: 1. Postsurgical changes in the stomach compatible with gastrectomy. Small nonenhancing extraluminal fluid collection adjacent to the staple line measuring 3.0 x 2.1 x  2.2 cm, indeterminate. No bowel obstruction or free air. 2. Colonic diverticulosis. 3. 2.5 cm ovarian simple-appearing cyst. No follow-up imaging is recommended. Reference: JACR 2020 Feb;17(2):248-254 4. Multiple pulmonary nodules. Most severe: 10 mm left solid pulmonary nodule detected on incomplete chest CT. Recommend prompt non-contrast Chest CT for further evaluation. These guidelines do not apply to immunocompromised patients and patients with cancer. Follow up in patients with significant comorbidities as clinically warranted. For lung cancer screening, adhere to Lung-RADS guidelines. Reference: Radiology. 2017; 284(1):228-43. 2.5 cm ovarian simple-appearing cyst. No follow-up imaging is recommended. Reference: JACR 2020 Feb;17(2):248-254 Electronically Signed   By: Ronney Asters M.D.   On: 01/04/2021 15:37    Procedures Procedures   Medications Ordered in ED Medications  enoxaparin (LOVENOX) injection 40 mg (has no administration in time range)  0.9 %  sodium chloride infusion (has no administration in time range)  acetaminophen (TYLENOL) tablet 650 mg (has no administration in time range)    Or  acetaminophen (TYLENOL) suppository 650 mg (has no administration in time range)  HYDROmorphone (DILAUDID) injection 0.5 mg (has no administration in time range)  ondansetron (ZOFRAN-ODT) disintegrating tablet 4 mg (has no administration in time range)    Or  ondansetron (ZOFRAN) injection 4 mg (has no administration in time range)  prochlorperazine (COMPAZINE) tablet 10 mg (has no administration in time range)    Or  prochlorperazine (COMPAZINE) injection 5-10 mg (has no administration in time range)  pantoprazole (PROTONIX) injection 40 mg (has no administration in time range)  metoprolol tartrate (LOPRESSOR) injection 5 mg (has no administration in time range)  albuterol (PROVENTIL) (2.5 MG/3ML) 0.083% nebulizer solution 2.5 mg (has no administration in time range)  insulin aspart (novoLOG)  injection 0-15 Units (has no administration in time range)  insulin aspart (novoLOG) injection 0-5 Units (has no administration in time range)  HYDROmorphone (DILAUDID) injection 1 mg (1 mg Intravenous Given 01/04/21 1504)  ondansetron (ZOFRAN) injection 4 mg (4 mg Intravenous Given 01/04/21 1504)  lactated ringers bolus 1,000 mL (1,000 mLs Intravenous New Bag/Given 01/04/21 1504)  iohexol (OMNIPAQUE) 350 MG/ML injection 80 mL (80 mLs Intravenous Contrast Given 01/04/21 1514)  morphine 4 MG/ML injection 4 mg (4 mg Intravenous Given 01/04/21 1629)  HYDROmorphone (DILAUDID) injection 1 mg (1 mg Intravenous Given 01/04/21 1836)    ED Course  I have reviewed the triage vital signs and the nursing notes.  Pertinent labs & imaging results that were available during my care of the patient were reviewed by me and considered in my medical decision making (see chart for details).    MDM Rules/Calculators/A&P                            45 year old female with a history of  asthma, diabetes, hyperlipidemia, PTSD, anxiety, obesity, recent laparoscopic sleeve gastrectomy on November 7 with Dr. Windle Guard, who presents with concern for abdominal pain, nausea and vomiting.  Differential diagnosis includes perforation, abscess, SBO, DKA.  Also reports dyspnea, and chest pain with ddx including ACS< PE, pneumonia.    EKG evaluated by me and without acute abnormalities> Troponin negative, doubt ACS.  CT PE study ordered given dyspnea, recent surgery.  CT abdomen pelvis ordered given severe abdominal pain, recent surgery.  Signed out to Dr. Ronnald Nian with CT pending.    Final Clinical Impression(s) / ED Diagnoses Final diagnoses:  Post-operative pain  Nausea and vomiting, unspecified vomiting type  Pulmonary nodules  Shortness of breath    Rx / DC Orders ED Discharge Orders     None        Gareth Morgan, MD 01/04/21 2048

## 2021-01-04 NOTE — ED Provider Notes (Signed)
Emergency Medicine Provider Triage Evaluation Note  Marisa [name] , a 45 y.o. female  was evaluated in triage.  Pt complains of post operative pain.  She had a gastric sleeve surgery on 11/7 and was discharged on the 8th.  She states that Since the surgery she has been only able to take a small amount of protein but now has been unable to tolerate any PO intake.  Unrelieved by zofran.  She reports that she gets a cramping squeezing pain in her abdomen when she tries to eat.  Having BM and passing gas.  No fevers.    She states that she called her surgical team today and they told her to come in.    She reports shortness of breath and chest pain when laying down.    Review of Systems  Positive: Abdominal pain, N/V, chest pain, shortness of breath Negative: Fevers, Diarrhea  Physical Exam  BP 112/82 (BP Location: Right Arm)   Pulse 76   Temp 98.2 F (36.8 C) (Oral)   Resp 17   LMP 11/27/2012   SpO2 98%  Gen:   Awake, no distress   Resp:  Normal effort  MSK:   Moves extremities without difficulty  Other:  Abdominal incisions are CDI.  Abdomen is TTP on right side.   Medical Decision Making  Medically screening exam initiated at 12:31 PM.  Appropriate orders placed.  Marisa [name] was informed that the remainder of the evaluation will be completed by another provider, this initial triage assessment does not replace that evaluation, and the importance of remaining in the ED until their evaluation is complete.  Note: Portions of this report may have been transcribed using voice recognition software. Every effort was made to ensure accuracy; however, inadvertent computerized transcription errors may be present    Lorin Glass, PA-C 01/04/21 1238    Regan Lemming, MD 01/04/21 1544

## 2021-01-04 NOTE — Plan of Care (Signed)
Plan of care initiated.

## 2021-01-04 NOTE — ED Provider Notes (Signed)
Patient with CT scan abdomen pelvis that shows 3 by 2 x 2 centimeter fluid collection.  She has no fever or white count or tachycardia.  She also incidentally has a pulmonary nodule that will need repeat CT scan in 3 months.  She is a former smoker.  She still continues with discomfort and nausea.  Talked with Dr. Donne Hazel with general surgery who will admit for further pain control and likely upper GI study to see if there is a leak in her gastric sleeve.  Patient admitted to surgery service.  This chart was dictated using voice recognition software.  Despite best efforts to proofread,  errors can occur which can change the documentation meaning.    Marisa Sites, DO 01/04/21 1819

## 2021-01-04 NOTE — H&P (Signed)
Marisa [name] is an 45 y.o. female.   Chief Complaint: n/v s/p SG HPI: 37yof with morbid obesity, DM, migraines who underwent routine LSG on 11/7.  She ws discharged home doing well.  She tolerated liquids until about three days ago when she stopped tolerating anything. Feels like it gets stuck in epigastrium and has n/v every time she eats. No fevers. Some abdominal pain. Having bowel function.  She has normal wbc and normal labs. Her vitals are normal (pulse in 60s).  She had a PE ct scan that is negative. She has ab ct scan that I have reviewed that shows an extraluminal fluid collection near staple line, there are no other abnormalities noted.  I was asked to see her.   Past Medical History:  Diagnosis Date   Abdominal pain 04/26/2013   Abnormal uterine bleeding (AUB) 10/11/2012   Acute bronchitis    Allergy    Anal pain    chronic   Anemia    Anxiety    Asthma    exacerbation 02-28-2014 and 02-23-2014 secondary to Rhinovirus   Atypical chest pain 04/26/2013   Benign neoplasm of sigmoid colon    Benign neoplasm of transverse colon    Carbuncle of labium 07/12/2015   Chest pain 02/20/2014   Chronic diarrhea    Chronic headaches    Chronic low back pain    Cigarette nicotine dependence without complication 44/04/4740   Cyst of right ovary    Dandruff 03/26/2015   Diabetes mellitus without complication ([place]) 59/56/3875   Difficult intravenous access    PER PT NEEDS PICC LINE   Dyspnea 09/07/2012   [name] 08/2012:  No obstruction by FEV1%, but probable restriction.     Falls 03/27/2014   Food allergy    Mushrooms, shellfish   GAD (generalized anxiety disorder) 05/04/2014   Gait disturbance 04/11/2014   Gait instability    GERD (gastroesophageal reflux disease)    History of adenomatous polyp of colon    History of cardiac arrest    during SVD 1992   History of ectopic pregnancy    2009-  S/P LEFT SALPINGECTOMY   History of panic attacks    Hyperlipidemia    IBS  (irritable bowel syndrome)    Insomnia 03/06/2014   Joint pain    Lower extremity edema    Lumbar stenosis L4 -- L5 with bulging disk   w/ right leg weakness/ decreased mobility   Migraine variant with headache 05/09/2014   Mild obstructive sleep apnea    study 03-20-2014  no cpap recommended   Neuromuscular disorder (HCC)    neuropathy in feet    Neuropathic pain of both legs 06/04/2016   Obesity (BMI 30-39.9) 03/05/2018   OSA (obstructive sleep apnea) 03/06/2014   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   Panniculitis 03/05/2018   Pelvic pain 07/25/2013   Persistent vomiting 04/27/2013   Pneumonia    PTSD (post-traumatic stress disorder) 05/04/2014   Rash and nonspecific skin eruption 07/12/2015   Rectal bleeding 07/25/2013   RLQ abdominal pain    S/P Total vaginal hysterectomy on 01/06/13 01/06/2013   Sleep apnea    mild no cpap   Social anxiety disorder 05/04/2014   Sore throat 02/20/2014   Stomach ulcer    Tachycardia 02/20/2014   Type 2 diabetes mellitus (HCC)    Weakness of right leg    FROM BACK PROBLEM PER PT    Past Surgical History:  Procedure Laterality Date   ABDOMINAL HYSTERECTOMY  partial   COLONOSCOPY Left 04/29/2013   Procedure: COLONOSCOPY;  Surgeon: Arta Silence, MD;  Location: [facility];  Service: Endoscopy;  Laterality: Left;   COLONOSCOPY     COLONOSCOPY WITH PROPOFOL N/A 08/01/2016   Procedure: COLONOSCOPY WITH PROPOFOL;  Surgeon: Doran Stabler, MD;  Location: [facility];  Service: Gastroenterology;  Laterality: N/A;   ECTOPIC PREGNANCY SURGERY     ESOPHAGOGASTRODUODENOSCOPY (EGD) WITH PROPOFOL N/A 11/10/2018   Procedure: ESOPHAGOGASTRODUODENOSCOPY (EGD) WITH PROPOFOL;  Surgeon: Doran Stabler, MD;  Location: [facility];  Service: Gastroenterology;  Laterality: N/A;   EVALUATION UNDER ANESTHESIA WITH FISTULECTOMY N/A 04/20/2014   Procedure: EXAM UNDER ANESTHESIA ;  Surgeon: Leighton Ruff, MD;  Location: [facility];  Service: General;  Laterality: N/A;   FLEXIBLE SIGMOIDOSCOPY N/A 11/09/2013   Procedure: FLEXIBLE SIGMOIDOSCOPY;  Surgeon: Arta Silence, MD;  Location: [facility];  Service: Endoscopy;  Laterality: N/A;   fupa removal      LAPAROSCOPIC CHOLECYSTECTOMY  2005   LAPAROSCOPIC GASTRIC SLEEVE RESECTION N/A 12/24/2020   Procedure: LAPAROSCOPIC GASTRIC SLEEVE RESECTION;  Surgeon: [name], MD;  Location: [facility];  Service: General;  Laterality: N/A;   REFRACTIVE SURGERY     SPHINCTEROTOMY N/A 04/20/2014   Procedure:  LATERAL INTERNAL SPHINCTEROTOMY;  Surgeon: Leighton Ruff, MD;  Location: [facility];  Service: General;  Laterality: N/A;   TRANSTHORACIC ECHOCARDIOGRAM  12/30/2012   mild LVH/  ef 55-60%   UNILATERAL SALPINGECTOMY  2009   laparotomy left salpingectomy-- ectopic preg.   UPPER GASTROINTESTINAL ENDOSCOPY     UPPER GI ENDOSCOPY N/A 12/24/2020   Procedure: UPPER GI ENDOSCOPY;  Surgeon: [name], MD;  Location: [facility];  Service: General;  Laterality: N/A;   VAGINAL HYSTERECTOMY N/A 01/06/2013   Procedure: HYSTERECTOMY VAGINAL;  Surgeon: Osborne Oman, MD;  Location: [facility];  Service: Gynecology;  Laterality: N/A;    Family History  Problem Relation Age of Onset   Hypertension Mother    Diabetes Mother    Allergies Mother    Heart disease Mother    Clotting disorder Mother    Stroke Mother    Kidney disease Mother    Thyroid disease Mother    Cancer Father    Hyperlipidemia Father    Hypertension Father    Colon cancer Father    Liver disease Father    Heart disease Maternal Grandmother    Breast cancer Maternal Grandmother 74   Schizophrenia Sister    Bipolar disorder Sister    Clotting disorder Sister    Bipolar disorder Brother    Kidney disease Brother    Bipolar disorder Sister    Pancreatic cancer Maternal Aunt    Prostate cancer Maternal Uncle    Liver cancer Maternal Grandfather    Rectal cancer Maternal Grandfather     Liver cancer Paternal Grandfather    Colon polyps Neg Hx    Esophageal cancer Neg Hx    Stomach cancer Neg Hx    Social History:  reports that she quit smoking about 7 years ago. Her smoking use included cigarettes. She has a 2.20 pack-year smoking history. She has never used smokeless tobacco. She reports that she does not drink alcohol and does not use drugs.  Allergies:  Allergies  Allergen Reactions   Asa [Aspirin] Anaphylaxis and Hives    Hives, chest tightness    Mushroom Extract Complex Anaphylaxis, Swelling and Other (See Comments)    Reaction:  Eye swelling  Penicillins Anaphylaxis and Other (See Comments)    Has patient had a PCN reaction causing immediate rash, facial/tongue/throat swelling, SOB or lightheadedness with hypotension: Yes Has patient had a PCN reaction causing severe rash involving mucus membranes or skin necrosis: No Has patient had a PCN reaction that required hospitalization No Has patient had a PCN reaction occurring within the last 10 years: No If all of the above answers are "NO", then may proceed with Cephalosporin use.   Shellfish Allergy Anaphylaxis   Triamcinolone Other (See Comments)    Skin issues      Results for orders placed or performed during the hospital encounter of 01/04/21 (from the past 48 hour(s))  Urinalysis, Routine w reflex microscopic Urine, Clean Catch     Status: Abnormal   Collection Time: 01/04/21 12:32 PM  Result Value Ref Range   Color, Urine YELLOW YELLOW   APPearance CLEAR CLEAR   Specific Gravity, Urine 1.041 (H) 1.005 - 1.030   pH 5.0 5.0 - 8.0   Glucose, UA NEGATIVE NEGATIVE mg/dL   Hgb urine dipstick NEGATIVE NEGATIVE   Bilirubin Urine NEGATIVE NEGATIVE   Ketones, ur 80 (A) NEGATIVE mg/dL   Protein, ur NEGATIVE NEGATIVE mg/dL   Nitrite NEGATIVE NEGATIVE   Leukocytes,Ua NEGATIVE NEGATIVE    Comment: Performed at [facility], [place] [address], [place], [place] [address]  Comprehensive  metabolic panel     Status: Abnormal   Collection Time: 01/04/21  2:02 PM  Result Value Ref Range   Sodium 139 135 - 145 mmol/L   Potassium 3.9 3.5 - 5.1 mmol/L   Chloride 101 98 - 111 mmol/L   CO2 24 22 - 32 mmol/L   Glucose, Bld 76 70 - 99 mg/dL    Comment: Glucose reference range applies only to samples taken after fasting for at least 8 hours.   BUN 10 6 - 20 mg/dL   Creatinine, Ser 0.69 0.44 - 1.00 mg/dL   Calcium 9.4 8.9 - 10.3 mg/dL   Total Protein 8.4 (H) 6.5 - 8.1 g/dL   Albumin 4.4 3.5 - 5.0 g/dL   AST 75 (H) 15 - 41 U/L   ALT 114 (H) 0 - 44 U/L   Alkaline Phosphatase 68 38 - 126 U/L   Total Bilirubin 1.0 0.3 - 1.2 mg/dL   GFR, Estimated >60 >60 mL/min    Comment: (NOTE) Calculated using the CKD-EPI Creatinine Equation (2021)    Anion gap 14 5 - 15    Comment: Performed at [facility], [place] [address], [place], [place] [address]  CBC with Differential     Status: Abnormal   Collection Time: 01/04/21  2:02 PM  Result Value Ref Range   WBC 6.6 4.0 - 10.5 K/uL   RBC 4.59 3.87 - 5.11 MIL/uL   Hemoglobin 14.4 12.0 - 15.0 g/dL   HCT 44.0 36.0 - 46.0 %   MCV 95.9 80.0 - 100.0 fL   MCH 31.4 26.0 - 34.0 pg   MCHC 32.7 30.0 - 36.0 g/dL   RDW 11.9 11.5 - 15.5 %   Platelets 408 (H) 150 - 400 K/uL   nRBC 0.0 0.0 - 0.2 %   Neutrophils Relative % 39 %   Neutro Abs 2.6 1.7 - 7.7 K/uL   Lymphocytes Relative 48 %   Lymphs Abs 3.2 0.7 - 4.0 K/uL   Monocytes Relative 9 %   Monocytes Absolute 0.6 0.1 - 1.0 K/uL   Eosinophils Relative 3 %  Eosinophils Absolute 0.2 0.0 - 0.5 K/uL   Basophils Relative 1 %   Basophils Absolute 0.1 0.0 - 0.1 K/uL   Immature Granulocytes 0 %   Abs Immature Granulocytes 0.02 0.00 - 0.07 K/uL    Comment: Performed at [facility], [place] [address] [place], [place] [address]  Lipase, blood     Status: None   Collection Time: 01/04/21  2:02 PM  Result Value Ref Range   Lipase 44 11 - 51 U/L    Comment: Performed  at [facility], [place] [address] [place], [place] [address]  Troponin I (High Sensitivity)     Status: None   Collection Time: 01/04/21  2:02 PM  Result Value Ref Range   Troponin I (High Sensitivity) 4 <18 ng/L    Comment: (NOTE) Elevated high sensitivity troponin I (hsTnI) values and significant  changes across serial measurements may suggest ACS but many other  chronic and acute conditions are known to elevate hsTnI results.  Refer to the "Links" section for chest pain algorithms and additional  guidance. Performed at [facility], [place] [address] [place], [place] [address]   Resp Panel by RT-PCR (Flu A&B, Covid) Nasopharyngeal Swab     Status: None   Collection Time: 01/04/21  2:03 PM   Specimen: Nasopharyngeal Swab; Nasopharyngeal(NP) swabs in vial transport medium  Result Value Ref Range   SARS Coronavirus 2 by RT PCR NEGATIVE NEGATIVE    Comment: (NOTE) SARS-CoV-2 target nucleic acids are NOT DETECTED.  The SARS-CoV-2 RNA is generally detectable in upper respiratory specimens during the acute phase of infection. The lowest concentration of SARS-CoV-2 viral copies this assay can detect is 138 copies/mL. A negative result does not preclude SARS-Cov-2 infection and should not be used as the sole basis for treatment or other patient management decisions. A negative result may occur with  improper specimen collection/handling, submission of specimen other than nasopharyngeal swab, presence of viral mutation(s) within the areas targeted by this assay, and inadequate number of viral copies(<138 copies/mL). A negative result must be combined with clinical observations, patient history, and epidemiological information. The expected result is Negative.  Fact Sheet for Patients:  EntrepreneurPulse.com.au  Fact Sheet for Healthcare Providers:  IncredibleEmployment.be  This test is no t yet approved or cleared  by the [place] FDA and  has been authorized for detection and/or diagnosis of SARS-CoV-2 by FDA under an Emergency Use Authorization (EUA). This EUA will remain  in effect (meaning this test can be used) for the duration of the COVID-19 declaration under Section 564(b)(1) of the Act, 21 U.S.C.section 360bbb-3(b)(1), unless the authorization is terminated  or revoked sooner.       Influenza A by PCR NEGATIVE NEGATIVE   Influenza B by PCR NEGATIVE NEGATIVE    Comment: (NOTE) The Xpert Xpress SARS-CoV-2/FLU/RSV plus assay is intended as an aid in the diagnosis of influenza from Nasopharyngeal swab specimens and should not be used as a sole basis for treatment. Nasal washings and aspirates are unacceptable for Xpert Xpress SARS-CoV-2/FLU/RSV testing.  Fact Sheet for Patients: EntrepreneurPulse.com.au  Fact Sheet for Healthcare Providers: IncredibleEmployment.be  This test is not yet approved or cleared by the [place] FDA and has been authorized for detection and/or diagnosis of SARS-CoV-2 by FDA under an Emergency Use Authorization (EUA). This EUA will remain in effect (meaning this test can be used) for the duration of the COVID-19 declaration under Section 564(b)(1) of the Act, 21 U.S.C. section 360bbb-3(b)(1),  unless the authorization is terminated or revoked.  Performed at [facility], [place] [address] [place], [place] [address]   CBG monitoring, ED     Status: None   Collection Time: 01/04/21  2:50 PM  Result Value Ref Range   Glucose-Capillary 70 70 - 99 mg/dL    Comment: Glucose reference range applies only to samples taken after fasting for at least 8 hours.  Pregnancy, urine     Status: None   Collection Time: 01/04/21  4:27 PM  Result Value Ref Range   Preg Test, Ur NEGATIVE NEGATIVE    Comment:        THE SENSITIVITY OF THIS METHODOLOGY IS >20 mIU/mL. Performed at [facility],  [place] [address], [place], [place] [address]    DG Chest 2 View  Result Date: 01/04/2021 CLINICAL DATA:  Postop gastric sleeve 12/24/2020, shortness of breath EXAM: CHEST - 2 VIEW COMPARISON:  03/12/2020 FINDINGS: The heart size and mediastinal contours are within normal limits. Both lungs are clear. The visualized skeletal structures are unremarkable. IMPRESSION: No active cardiopulmonary disease. Electronically Signed   By: Jerilynn Mages.  Shick M.D.   On: 01/04/2021 13:17   CT Angio Chest PE W and/or Wo Contrast  Result Date: 01/04/2021 CLINICAL DATA:  Concern for pulmonary embolism EXAM: CT ANGIOGRAPHY CHEST WITH CONTRAST TECHNIQUE: Multidetector CT imaging of the chest was performed using the standard protocol during bolus administration of intravenous contrast. Multiplanar CT image reconstructions and MIPs were obtained to evaluate the vascular anatomy. CONTRAST:  64mL OMNIPAQUE IOHEXOL 350 MG/ML SOLN COMPARISON:  None. FINDINGS: Cardiovascular: No filling defects within the pulmonary arteries to suggest acute pulmonary embolism. Mediastinum/Nodes: No axillary or supraclavicular adenopathy. No mediastinal or hilar adenopathy. No pericardial fluid. Esophagus normal. Lungs/Pleura: 9 mm nodule in the medial recent 9 mm nodule in the lateral aspect of the LEFT lower lobe (image 82/6) No pulmonary infarction.  No pneumonia. Upper Abdomen: Limited view of the liver, kidneys, pancreas are unremarkable. Normal adrenal glands. Musculoskeletal: No chest wall abnormality. No acute or significant osseous findings. Review of the MIP images confirms the above findings. IMPRESSION: 1. No evidence acute pulmonary embolism. 2. No pneumonia or pulmonary infarction. 3. LEFT lobe pulmonary nodule. Recommend a non-contrast Chest CT at 3 months. These guidelines do not apply to immunocompromised patients and patients with cancer. Follow up in patients with significant comorbidities as clinically warranted. For lung cancer screening,  adhere to Lung-RADS guidelines. Reference: Radiology. 2017; 284(1):228-43. Electronically Signed   By: Suzy Bouchard M.D.   On: 01/04/2021 15:35   CT ABDOMEN PELVIS W CONTRAST  Result Date: 01/04/2021 CLINICAL DATA:  Sleeve gastrectomy on 12/24/2020. Evaluate for bowel obstruction. Abdominal pain. EXAM: CT ABDOMEN AND PELVIS WITH CONTRAST TECHNIQUE: Multidetector CT imaging of the abdomen and pelvis was performed using the standard protocol following bolus administration of intravenous contrast. CONTRAST:  65mL OMNIPAQUE IOHEXOL 350 MG/ML SOLN COMPARISON:  CT abdomen and pelvis 03/02/2020. FINDINGS: Lower chest: Nodular density is seen in the lingula measuring 1 cm, new from prior. Hepatobiliary: No focal liver abnormality is seen. Status post cholecystectomy. No biliary dilatation. Pancreas: Unremarkable. No pancreatic ductal dilatation or surrounding inflammatory changes. Spleen: Normal in size without focal abnormality. Adrenals/Urinary Tract: Adrenal glands are unremarkable. Kidneys are normal, without renal calculi, focal lesion, or hydronephrosis. Bladder is unremarkable. Stomach/Bowel: There are postsurgical changes in the proximal stomach. There is a small extraluminal fluid collection adjacent to the staple line measuring 3.0 x 2.1 x 2.2 cm.  There is no wall enhancement, air or surrounding inflammatory stranding in this region. There are no dilated bowel loops. There is no wall thickening. There is diffuse colonic diverticulosis without evidence for diverticulitis. The appendix is within normal limits. Vascular/Lymphatic: No significant vascular findings are present. No enlarged abdominal or pelvic lymph nodes. Reproductive: Status post hysterectomy. No adnexal masses. Follicular cysts are seen in both ovaries measuring up to 2.5 cm. Other: No abdominal wall hernia or abnormality. No abdominopelvic ascites. There is mild subcutaneous stranding in the right anterior abdominal wall likely related to  recent surgery. There are no abdominal wall fluid collections. Musculoskeletal: No acute or significant osseous findings. IMPRESSION: 1. Postsurgical changes in the stomach compatible with gastrectomy. Small nonenhancing extraluminal fluid collection adjacent to the staple line measuring 3.0 x 2.1 x 2.2 cm, indeterminate. No bowel obstruction or free air. 2. Colonic diverticulosis. 3. 2.5 cm ovarian simple-appearing cyst. No follow-up imaging is recommended. Reference: JACR 2020 Feb;17(2):248-254 4. Multiple pulmonary nodules. Most severe: 10 mm left solid pulmonary nodule detected on incomplete chest CT. Recommend prompt non-contrast Chest CT for further evaluation. These guidelines do not apply to immunocompromised patients and patients with cancer. Follow up in patients with significant comorbidities as clinically warranted. For lung cancer screening, adhere to Lung-RADS guidelines. Reference: Radiology. 2017; 284(1):228-43. 2.5 cm ovarian simple-appearing cyst. No follow-up imaging is recommended. Reference: JACR 2020 Feb;17(2):248-254 Electronically Signed   By: Ronney Asters M.D.   On: 01/04/2021 15:37    Review of Systems  Constitutional:  Negative for fever.  Respiratory:  Negative for shortness of breath.   Cardiovascular:  Negative for chest pain.  Gastrointestinal:  Positive for abdominal pain, nausea and vomiting.  Neurological:  Positive for headaches.  All other systems reviewed and are negative.  Blood pressure 115/78, pulse 64, temperature 98.2 F (36.8 C), temperature source Oral, resp. rate 18, last menstrual period 11/27/2012, SpO2 98 %. Physical Exam Constitutional:      Appearance: Normal appearance.  HENT:     Right Ear: External ear normal.     Left Ear: External ear normal.     Nose: Nose normal.     Mouth/Throat:     Mouth: Mucous membranes are moist.  Eyes:     General: No scleral icterus. Cardiovascular:     Rate and Rhythm: Normal rate and regular rhythm.   Pulmonary:     Effort: Pulmonary effort is normal.  Abdominal:     General: There is no distension.     Palpations: Abdomen is soft. There is no mass.     Tenderness: There is abdominal tenderness (epigastrium and left abdomen, no peritonitis).     Hernia: No hernia is present.  Musculoskeletal:     Cervical back: Normal range of motion and neck supple.     Right lower leg: No edema.     Left lower leg: No edema.  Skin:    General: Skin is warm and dry.     Capillary Refill: Capillary refill takes less than 2 seconds.     Coloration: Skin is not jaundiced.  Neurological:     General: No focal deficit present.     Mental Status: She is alert.     Deep Tendon Reflexes: Reflexes normal.  Psychiatric:        Mood and Affect: Mood normal.        Behavior: Behavior normal.     Assessment/Plan S/P LSG with nausea/vomiting, unable to tolerate po -no clinical indication to return to  OR -admission, recheck labs in am, reexamine, will hydrate overnight -will obtain ugi to assess for leak and "kink" in sleeve leading to mechanical obstruction -SSI for DM -protonix -lovenox, scds   Rolm Bookbinder, MD 01/04/2021, 6:51 PM

## 2021-01-04 NOTE — ED Triage Notes (Signed)
Pt presents with severe pain to surgical site after recent gastric sleeve surgery. She also endorses vomiting, dry mouth, and headache. Denies fever.

## 2021-01-05 ENCOUNTER — Inpatient Hospital Stay (HOSPITAL_COMMUNITY): Payer: Medicare Other

## 2021-01-05 LAB — BASIC METABOLIC PANEL
Anion gap: 11 (ref 5–15)
BUN: 7 mg/dL (ref 6–20)
CO2: 23 mmol/L (ref 22–32)
Calcium: 8.2 mg/dL — ABNORMAL LOW (ref 8.9–10.3)
Chloride: 104 mmol/L (ref 98–111)
Creatinine, Ser: 0.64 mg/dL (ref 0.44–1.00)
GFR, Estimated: >60 mL/min (ref >60)
Glucose, Bld: 83 mg/dL (ref 70–99)
Potassium: 3.9 mmol/L (ref 3.5–5.1)
Sodium: 138 mmol/L (ref 135–145)

## 2021-01-05 LAB — CBC
HCT: 40.1 % (ref 36.0–46.0)
Hemoglobin: 13 g/dL (ref 12.0–15.0)
MCH: 31.1 pg (ref 26.0–34.0)
MCHC: 32.4 g/dL (ref 30.0–36.0)
MCV: 95.9 fL (ref 80.0–100.0)
Platelets: 331 10*3/uL (ref 150–400)
RBC: 4.18 MIL/uL (ref 3.87–5.11)
RDW: 11.9 % (ref 11.5–15.5)
WBC: 5.5 10*3/uL (ref 4.0–10.5)
nRBC: 0 % (ref 0.0–0.2)

## 2021-01-05 LAB — GLUCOSE, CAPILLARY
Glucose-Capillary: 64 mg/dL — ABNORMAL LOW (ref 70–99)
Glucose-Capillary: 92 mg/dL (ref 70–99)
Glucose-Capillary: 103 mg/dL — ABNORMAL HIGH (ref 70–99)
Glucose-Capillary: 119 mg/dL — ABNORMAL HIGH (ref 70–99)
Glucose-Capillary: 116 mg/dL — ABNORMAL HIGH (ref 70–99)

## 2021-01-05 MED ORDER — IOHEXOL 300 MG/ML  SOLN
100.0000 mL | Freq: Once | INTRAMUSCULAR | Status: AC | PRN
  Administered 2021-01-05: 100 mL via ORAL

## 2021-01-05 MED ORDER — KCL IN DEXTROSE-NACL 40-5-0.9 MEQ/L-%-% IV SOLN
INTRAVENOUS | Status: DC
  Filled 2021-01-05 (×5): qty 1000

## 2021-01-05 NOTE — Progress Notes (Signed)
Subjective/Chief Complaint: Marisa [name] with some epigastric abd pain UGI pending   Objective: Vital signs in last 24 hours: Temp:  [97.6 F (36.4 C)-98.2 F (36.8 C)] 97.7 F (36.5 C) (11/19 0552) Pulse Rate:  [56-76] 62 (11/19 0552) Resp:  [16-18] 17 (11/19 0552) BP: (93-115)/(63-82) 93/63 (11/19 0552) SpO2:  [94 %-100 %] 100 % (11/19 0552) Weight:  [111.8 kg] 111.8 kg (11/18 2014) Last BM Date: 01/01/21  Intake/Output from previous day: 11/18 0701 - 11/19 0700 In: 1060.4 [P.O.:20; I.V.:1040.4] Out: -  Intake/Output this shift: No intake/output data recorded.  PE:  Constitutional: No acute distress, conversant, appears states age. Eyes: Anicteric sclerae, moist conjunctiva, no lid lag Lungs: Clear to auscultation bilaterally, normal respiratory effort CV: regular rate and rhythm, no murmurs, no peripheral edema, pedal pulses 2+ GI: Soft, no masses or hepatosplenomegaly, non-tender to palpation, no rebound/guarding Skin: No rashes, palpation reveals normal turgor Psychiatric: appropriate judgment and insight, oriented to person, place, and time   Lab Results:  Recent Labs    01/04/21 1402 01/05/21 0336  WBC 6.6 5.5  HGB 14.4 13.0  HCT 44.0 40.1  PLT 408* 331   BMET Recent Labs    01/04/21 1402 01/05/21 0336  NA 139 138  K 3.9 3.9  CL 101 104  CO2 24 23  GLUCOSE 76 83  BUN 10 7  CREATININE 0.69 0.64  CALCIUM 9.4 8.2*   Marisa [name]/INR No results for input(s): LABPROT, INR in the last 72 hours. ABG No results for input(s): PHART, HCO3 in the last 72 hours.  Invalid input(s): PCO2, PO2  Studies/Results: DG Chest 2 View  Result Date: 01/04/2021 CLINICAL DATA:  Postop gastric sleeve 12/24/2020, shortness of breath EXAM: CHEST - 2 VIEW COMPARISON:  03/12/2020 FINDINGS: The heart size and mediastinal contours are within normal limits. Both lungs are clear. The visualized skeletal structures are unremarkable. IMPRESSION: No active cardiopulmonary disease.  Electronically Signed   By: Jerilynn Mages.  Shick M.D.   On: 01/04/2021 13:17   CT Angio Chest PE W and/or Wo Contrast  Result Date: 01/04/2021 CLINICAL DATA:  Concern for pulmonary embolism EXAM: CT ANGIOGRAPHY CHEST WITH CONTRAST TECHNIQUE: Multidetector CT imaging of the chest was performed using the standard protocol during bolus administration of intravenous contrast. Multiplanar CT image reconstructions and MIPs were obtained to evaluate the vascular anatomy. CONTRAST:  66mL OMNIPAQUE IOHEXOL 350 MG/ML SOLN COMPARISON:  None. FINDINGS: Cardiovascular: No filling defects within the pulmonary arteries to suggest acute pulmonary embolism. Mediastinum/Nodes: No axillary or supraclavicular adenopathy. No mediastinal or hilar adenopathy. No pericardial fluid. Esophagus normal. Lungs/Pleura: 9 mm nodule in the medial recent 9 mm nodule in the lateral aspect of the LEFT lower lobe (image 82/6) No pulmonary infarction.  No pneumonia. Upper Abdomen: Limited view of the liver, kidneys, pancreas are unremarkable. Normal adrenal glands. Musculoskeletal: No chest wall abnormality. No acute or significant osseous findings. Review of the MIP images confirms the above findings. IMPRESSION: 1. No evidence acute pulmonary embolism. 2. No pneumonia or pulmonary infarction. 3. LEFT lobe pulmonary nodule. Recommend a non-contrast Chest CT at 3 months. These guidelines do not apply to immunocompromised patients and patients with cancer. Follow up in patients with significant comorbidities as clinically warranted. For lung cancer screening, adhere to Lung-RADS guidelines. Reference: Radiology. 2017; 284(1):228-43. Electronically Signed   By: Suzy Bouchard M.D.   On: 01/04/2021 15:35   CT ABDOMEN PELVIS W CONTRAST  Result Date: 01/04/2021 CLINICAL DATA:  Sleeve gastrectomy on 12/24/2020. Evaluate for bowel obstruction. Abdominal  pain. EXAM: CT ABDOMEN AND PELVIS WITH CONTRAST TECHNIQUE: Multidetector CT imaging of the abdomen and  pelvis was performed using the standard protocol following bolus administration of intravenous contrast. CONTRAST:  30mL OMNIPAQUE IOHEXOL 350 MG/ML SOLN COMPARISON:  CT abdomen and pelvis 03/02/2020. FINDINGS: Lower chest: Nodular density is seen in the lingula measuring 1 cm, new from prior. Hepatobiliary: No focal liver abnormality is seen. Status post cholecystectomy. No biliary dilatation. Pancreas: Unremarkable. No pancreatic ductal dilatation or surrounding inflammatory changes. Spleen: Normal in size without focal abnormality. Adrenals/Urinary Tract: Adrenal glands are unremarkable. Kidneys are normal, without renal calculi, focal lesion, or hydronephrosis. Bladder is unremarkable. Stomach/Bowel: There are postsurgical changes in the proximal stomach. There is a small extraluminal fluid collection adjacent to the staple line measuring 3.0 x 2.1 x 2.2 cm. There is no wall enhancement, air or surrounding inflammatory stranding in this region. There are no dilated bowel loops. There is no wall thickening. There is diffuse colonic diverticulosis without evidence for diverticulitis. The appendix is within normal limits. Vascular/Lymphatic: No significant vascular findings are present. No enlarged abdominal or pelvic lymph nodes. Reproductive: Status post hysterectomy. No adnexal masses. Follicular cysts are seen in both ovaries measuring up to 2.5 cm. Other: No abdominal wall hernia or abnormality. No abdominopelvic ascites. There is mild subcutaneous stranding in the right anterior abdominal wall likely related to recent surgery. There are no abdominal wall fluid collections. Musculoskeletal: No acute or significant osseous findings. IMPRESSION: 1. Postsurgical changes in the stomach compatible with gastrectomy. Small nonenhancing extraluminal fluid collection adjacent to the staple line measuring 3.0 x 2.1 x 2.2 cm, indeterminate. No bowel obstruction or free air. 2. Colonic diverticulosis. 3. 2.5 cm ovarian  simple-appearing cyst. No follow-up imaging is recommended. Reference: JACR 2020 Feb;17(2):248-254 4. Multiple pulmonary nodules. Most severe: 10 mm left solid pulmonary nodule detected on incomplete chest CT. Recommend prompt non-contrast Chest CT for further evaluation. These guidelines do not apply to immunocompromised patients and patients with cancer. Follow up in patients with significant comorbidities as clinically warranted. For lung cancer screening, adhere to Lung-RADS guidelines. Reference: Radiology. 2017; 284(1):228-43. 2.5 cm ovarian simple-appearing cyst. No follow-up imaging is recommended. Reference: JACR 2020 Feb;17(2):248-254 Electronically Signed   By: Ronney Asters M.D.   On: 01/04/2021 15:37      Assessment/Plan: S/P LSG with nausea/vomiting, unable to tolerate po -no clinical indication to return to OR -will obtain ugi to assess for leak and "kink" in sleeve leading to mechanical obstruction -SSI for DM -protonix -lovenox, scds  LOS: 1 day    Ralene Ok 01/05/2021

## 2021-01-06 LAB — GLUCOSE, CAPILLARY
Glucose-Capillary: 113 mg/dL — ABNORMAL HIGH (ref 70–99)
Glucose-Capillary: 134 mg/dL — ABNORMAL HIGH (ref 70–99)
Glucose-Capillary: 96 mg/dL (ref 70–99)
Glucose-Capillary: 124 mg/dL — ABNORMAL HIGH (ref 70–99)

## 2021-01-06 NOTE — Plan of Care (Signed)
  Problem: Education: Goal: Knowledge of General Education information will improve Description: Including pain rating scale, medication(s)/side effects and non-pharmacologic comfort measures Outcome: Progressing   Problem: Pain Managment: Goal: General experience of comfort will improve Outcome: Progressing   Problem: Elimination: Goal: Will not experience complications related to bowel motility Outcome: Progressing   Problem: Coping: Goal: Level of anxiety will decrease Outcome: Progressing

## 2021-01-06 NOTE — Plan of Care (Signed)

## 2021-01-06 NOTE — Progress Notes (Addendum)
Subjective/Chief Complaint: Some RUQ/epigastric pain that is intermittent and tolerable. BM yesterday. No nausea or emesis today. ambulating   Objective: Vital signs in last 24 hours: Temp:  [98 F (36.7 C)] 98 F (36.7 C) (11/20 0630) Pulse Rate:  [60-75] 75 (11/20 0630) Resp:  [16-18] 17 (11/20 0630) BP: (99-119)/(72-85) 107/75 (11/20 0630) SpO2:  [97 %-99 %] 97 % (11/20 0630) Last BM Date: 12/23/20  Intake/Output from previous day: 11/19 0701 - 11/20 0700 In: 2285.4 [I.V.:2285.4] Out: -  Intake/Output this shift: No intake/output data recorded.  PE:  Constitutional: No acute distress, conversant, appears stated age. Eyes: Anicteric sclerae, moist conjunctiva, no lid lag Lungs: normal respiratory effort on room air CV: regular rate and rhythm, palpable radial pulse. no peripheral edema GI: Soft, no masses or hepatosplenomegaly, mild TPP RUQ/epigastrium, no rebound/guarding. Incisions c/d/i Skin: No rashes, palpation reveals normal turgor Psychiatric: appropriate judgment and insight, oriented to person, place, and time   Lab Results:  Recent Labs    01/04/21 1402 01/05/21 0336  WBC 6.6 5.5  HGB 14.4 13.0  HCT 44.0 40.1  PLT 408* 331    BMET Recent Labs    01/04/21 1402 01/05/21 0336  NA 139 138  K 3.9 3.9  CL 101 104  CO2 24 23  GLUCOSE 76 83  BUN 10 7  CREATININE 0.69 0.64  CALCIUM 9.4 8.2*    PT/INR No results for input(s): LABPROT, INR in the last 72 hours. ABG No results for input(s): PHART, HCO3 in the last 72 hours.  Invalid input(s): PCO2, PO2  Studies/Results: DG Chest 2 View  Result Date: 01/04/2021 CLINICAL DATA:  Postop gastric sleeve 12/24/2020, shortness of breath EXAM: CHEST - 2 VIEW COMPARISON:  03/12/2020 FINDINGS: The heart size and mediastinal contours are within normal limits. Both lungs are clear. The visualized skeletal structures are unremarkable. IMPRESSION: No active cardiopulmonary disease. Electronically Signed    By: Jerilynn Mages.  Shick M.D.   On: 01/04/2021 13:17   CT Angio Chest PE W and/or Wo Contrast  Result Date: 01/04/2021 CLINICAL DATA:  Concern for pulmonary embolism EXAM: CT ANGIOGRAPHY CHEST WITH CONTRAST TECHNIQUE: Multidetector CT imaging of the chest was performed using the standard protocol during bolus administration of intravenous contrast. Multiplanar CT image reconstructions and MIPs were obtained to evaluate the vascular anatomy. CONTRAST:  17mL OMNIPAQUE IOHEXOL 350 MG/ML SOLN COMPARISON:  None. FINDINGS: Cardiovascular: No filling defects within the pulmonary arteries to suggest acute pulmonary embolism. Mediastinum/Nodes: No axillary or supraclavicular adenopathy. No mediastinal or hilar adenopathy. No pericardial fluid. Esophagus normal. Lungs/Pleura: 9 mm nodule in the medial recent 9 mm nodule in the lateral aspect of the LEFT lower lobe (image 82/6) No pulmonary infarction.  No pneumonia. Upper Abdomen: Limited view of the liver, kidneys, pancreas are unremarkable. Normal adrenal glands. Musculoskeletal: No chest wall abnormality. No acute or significant osseous findings. Review of the MIP images confirms the above findings. IMPRESSION: 1. No evidence acute pulmonary embolism. 2. No pneumonia or pulmonary infarction. 3. LEFT lobe pulmonary nodule. Recommend a non-contrast Chest CT at 3 months. These guidelines do not apply to immunocompromised patients and patients with cancer. Follow up in patients with significant comorbidities as clinically warranted. For lung cancer screening, adhere to Lung-RADS guidelines. Reference: Radiology. 2017; 284(1):228-43. Electronically Signed   By: Suzy Bouchard M.D.   On: 01/04/2021 15:35   CT ABDOMEN PELVIS W CONTRAST  Result Date: 01/04/2021 CLINICAL DATA:  Sleeve gastrectomy on 12/24/2020. Evaluate for bowel obstruction. Abdominal pain. EXAM: CT  ABDOMEN AND PELVIS WITH CONTRAST TECHNIQUE: Multidetector CT imaging of the abdomen and pelvis was performed using  the standard protocol following bolus administration of intravenous contrast. CONTRAST:  4mL OMNIPAQUE IOHEXOL 350 MG/ML SOLN COMPARISON:  CT abdomen and pelvis 03/02/2020. FINDINGS: Lower chest: Nodular density is seen in the lingula measuring 1 cm, new from prior. Hepatobiliary: No focal liver abnormality is seen. Status post cholecystectomy. No biliary dilatation. Pancreas: Unremarkable. No pancreatic ductal dilatation or surrounding inflammatory changes. Spleen: Normal in size without focal abnormality. Adrenals/Urinary Tract: Adrenal glands are unremarkable. Kidneys are normal, without renal calculi, focal lesion, or hydronephrosis. Bladder is unremarkable. Stomach/Bowel: There are postsurgical changes in the proximal stomach. There is a small extraluminal fluid collection adjacent to the staple line measuring 3.0 x 2.1 x 2.2 cm. There is no wall enhancement, air or surrounding inflammatory stranding in this region. There are no dilated bowel loops. There is no wall thickening. There is diffuse colonic diverticulosis without evidence for diverticulitis. The appendix is within normal limits. Vascular/Lymphatic: No significant vascular findings are present. No enlarged abdominal or pelvic lymph nodes. Reproductive: Status post hysterectomy. No adnexal masses. Follicular cysts are seen in both ovaries measuring up to 2.5 cm. Other: No abdominal wall hernia or abnormality. No abdominopelvic ascites. There is mild subcutaneous stranding in the right anterior abdominal wall likely related to recent surgery. There are no abdominal wall fluid collections. Musculoskeletal: No acute or significant osseous findings. IMPRESSION: 1. Postsurgical changes in the stomach compatible with gastrectomy. Small nonenhancing extraluminal fluid collection adjacent to the staple line measuring 3.0 x 2.1 x 2.2 cm, indeterminate. No bowel obstruction or free air. 2. Colonic diverticulosis. 3. 2.5 cm ovarian simple-appearing cyst. No  follow-up imaging is recommended. Reference: JACR 2020 Feb;17(2):248-254 4. Multiple pulmonary nodules. Most severe: 10 mm left solid pulmonary nodule detected on incomplete chest CT. Recommend prompt non-contrast Chest CT for further evaluation. These guidelines do not apply to immunocompromised patients and patients with cancer. Follow up in patients with significant comorbidities as clinically warranted. For lung cancer screening, adhere to Lung-RADS guidelines. Reference: Radiology. 2017; 284(1):228-43. 2.5 cm ovarian simple-appearing cyst. No follow-up imaging is recommended. Reference: JACR 2020 Feb;17(2):248-254 Electronically Signed   By: Ronney Asters M.D.   On: 01/04/2021 15:37   DG UGI W SINGLE CM (SOL OR THIN BA)  Result Date: 01/05/2021 CLINICAL DATA:  Evaluate for gastric leak. Patient had gastric sleeve December 24, 2020. Patient was tolerating fluids until 3 days ago. Patient feels like liquids are getting stuck in her epigastrium resulting in nausea and vomiting. There is a small amount of fluid adjacent to the suture line of the stomach on a CT scan from yesterday. EXAM: WATER SOLUBLE UPPER GI SERIES TECHNIQUE: Single-column upper GI series was performed using water soluble contrast. CONTRAST:  75 mL of Omnipaque 300 COMPARISON:  CT scan January 04, 2021 FLUOROSCOPY TIME:  Fluoroscopy Time:  1.7 minutes Radiation Exposure Index (if provided by the fluoroscopic device): 65.1 mGy Number of Acquired Spot Images: 0 FINDINGS: The initial KUB demonstrates postoperative changes consistent with gastric sleeve surgery. The esophagus is normal in appearance. Contrast quickly empties the esophagus, entering the surgically altered stomach. Contrast also empties the stomach into the small bowel at a normal pace throughout the study. By the end of the study, contrast was seen in the proximal jejunum. No gastric or small bowel distention. Images of the gastric sleeve were obtained in multiple views  demonstrating no leak. IMPRESSION: No obstruction or leak identified.  Normal postop study. Electronically Signed   By: Dorise Bullion III M.D.   On: 01/05/2021 10:41      Assessment/Plan: S/P LSG with nausea/vomiting, unable to tolerate po -no clinical indication to return to OR -UGI without leak or obstruction - clinically improving. Start FLD today -SSI for DM -protonix -lovenox, scds   LOS: 2 days    Winferd Humphrey, [facility] 01/06/2021, 9:26 AM Please see Amion for pager number during day hours 7:00am-4:30pm  01/06/2021

## 2021-01-07 LAB — GLUCOSE, CAPILLARY: Glucose-Capillary: 118 mg/dL — ABNORMAL HIGH (ref 70–99)

## 2021-01-07 MED ORDER — TRAMADOL HCL 50 MG PO TABS: 50.0000 mg | ORAL_TABLET | Freq: Four times a day (QID) | ORAL | 0 refills | Status: AC | PRN

## 2021-01-07 MED ORDER — ONDANSETRON 4 MG PO TBDP: 4.0000 mg | ORAL_TABLET | Freq: Four times a day (QID) | ORAL | 1 refills | Status: DC | PRN

## 2021-01-07 NOTE — Progress Notes (Signed)
S: She is feeling better.  Able to get liquids down now without difficulty.  Felt that the protein shakes were sticking and taking a long time to go down, but was doing well getting clear liquids down.  Most of her pain currently is at the extraction port site.  She states that she did not have any left upper quadrant pain, but did have subxiphoid pain.  O: Vitals, labs, intake/output, and orders reviewed at this time.  Has had no fever, tachycardia, or leukocytosis at any point.   Gen: A&Ox3, no distress  H&N: EOMI, atraumatic, neck supple Chest: unlabored respirations, RRR Abd: soft, nontender, nondistended, incision(s) c/d/i without cellulitis or hematoma, no hernia. Ext: warm, no edema Neuro: grossly normal  Lines/tubes/drains: piv  A/P: 2 weeks status post sleeve gastrectomy, returned with p.o. intolerance and epigastric pain.  Of note, intraoperatively there was oozing along the staple line including the most proximal edge of the stomach which was addressed with clips and Surgicel snow-I think that this is probably what the fluid collection noted on CT represents.  No evidence of leak on her upper GI nor clinically given her labs/vitals.  She is doing somewhat better and is tolerating liquids now.  We will plan discharge today and she may need an extended course of phase 2 diet.   Romana Juniper, MD [facility], [place]

## 2021-01-07 NOTE — Discharge Summary (Signed)
Physician Discharge Summary  Patient ID: Marisa [name] MRN: [id number] DOB/AGE: [birth year] 45 y.o.  Admit date: 01/04/2021 Discharge date: 01/07/2021  Admission Diagnoses: abdominal pain  Discharge Diagnoses:  Principal Problem:   S/P laparoscopic sleeve gastrectomy   Discharged Condition: good  Hospital Course: Patient presented with epigastric pain and p.o. intolerance 11 days status post uneventful sleeve gastrectomy.  Her vital signs and lab work were within normal limits with the exception of chronically mildly elevated AST and ALT.  Underwent a CT scan which showed a small fluid collection along the staple line superiorly and follow-up upper GI which was negative for leak or obstruction.  She was fluid resuscitated and pain controlled, she was able to tolerate liquids and she remained afebrile without tachycardia and noted that her pain has significantly improved by the day of discharge.  Consults: None  Significant Diagnostic Studies: As above  Discharge Exam: Blood pressure 93/68, pulse 67, temperature 98 F (36.7 C), temperature source Oral, resp. rate 16, height '5\' 5"'  (1.651 m), weight 111.8 kg, last menstrual period 11/27/2012, SpO2 97 %. See rounding note  Disposition: Discharge disposition: 01-Home or Self Care       Allergies as of 01/07/2021       Reactions   Asa [aspirin] Anaphylaxis, Hives   Hives, chest tightness   Mushroom Extract Complex Anaphylaxis, Swelling, Other (See Comments)   Reaction:  Eye swelling   Penicillins Anaphylaxis, Other (See Comments)   Has patient had a PCN reaction causing immediate rash, facial/tongue/throat swelling, SOB or lightheadedness with hypotension: Yes Has patient had a PCN reaction causing severe rash involving mucus membranes or skin necrosis: No Has patient had a PCN reaction that required hospitalization No Has patient had a PCN reaction occurring within the last 10 years: No If all of the above answers are  "NO", then may proceed with Cephalosporin use.   Shellfish Allergy Anaphylaxis   Triamcinolone Other (See Comments)   Skin issues         Medication List     TAKE these medications    Accu-Chek Guide test strip Generic drug: glucose blood Use as instructed   Accu-Chek Guide w/Device Kit Check blood sugars daily   Accu-Chek Softclix Lancet Dev Kit Use to check blood sugar   Accu-Chek Softclix Lancets lancets Use as instructed   albuterol 108 (90 Base) MCG/ACT inhaler Commonly known as: Proventil HFA Inhale 2 puffs into the lungs every 6 (six) hours as needed for wheezing or shortness of breath.   azithromycin 250 MG tablet Commonly known as: ZITHROMAX Take 2 tablets on day 1, then 1 tablet daily on days 2 through 5 What changed:  how much to take how to take this when to take this   EPINEPHrine 0.3 mg/0.3 mL Soaj injection Commonly known as: EPI-PEN Inject 0.3 mg into the muscle as needed for anaphylaxis.   gabapentin 100 MG capsule Commonly known as: NEURONTIN Take 2 capsules (200 mg total) by mouth 2 (two) times daily.   ondansetron 4 MG disintegrating tablet Commonly known as: ZOFRAN-ODT Dissolve 1 tablet (4 mg total) by mouth every 6 (six) hours as needed for nausea or vomiting.   pantoprazole 40 MG tablet Commonly known as: PROTONIX Take 1 tablet (40 mg total) by mouth daily.   topiramate 25 MG tablet Commonly known as: Topamax Take 1 tablet (25 mg total) by mouth 2 (two) times daily.   traMADol 50 MG tablet Commonly known as: Ultram Take 1 tablet (50 mg total) by  mouth every 6 (six) hours as needed for up to 5 days.   traZODone 100 MG tablet Commonly known as: DESYREL Take 100-200 mg by mouth at bedtime as needed for sleep.         Signed: [name] 01/07/2021, 1:17 PM

## 2021-01-07 NOTE — Discharge Instructions (Signed)
Refer to your discharge instructions from previous admission.  Continue with clear liquids and protein shakes only for now, be sure to focus on small sips, space several minutes apart, do not use straws.  It is common to have some difficulty with the thicker texture protein shakes, if you tolerate clear liquid protein drinks better this is acceptable.

## 2021-01-08 ENCOUNTER — Encounter: Payer: Medicare Other | Attending: Surgery | Admitting: Skilled Nursing Facility1

## 2021-01-08 ENCOUNTER — Other Ambulatory Visit: Payer: Self-pay

## 2021-01-08 ENCOUNTER — Telehealth (HOSPITAL_COMMUNITY): Payer: Self-pay | Admitting: *Deleted

## 2021-01-08 ENCOUNTER — Telehealth: Payer: Self-pay

## 2021-01-08 DIAGNOSIS — E669 Obesity, unspecified: Secondary | ICD-10-CM | POA: Insufficient documentation

## 2021-01-08 DIAGNOSIS — E1140 Type 2 diabetes mellitus with diabetic neuropathy, unspecified: Secondary | ICD-10-CM | POA: Diagnosis present

## 2021-01-08 NOTE — Telephone Encounter (Signed)
Transition Care Management Unsuccessful Follow-up Telephone Call  Date of discharge and from where:  01/07/2021, [facility]  Attempts:  1st Attempt  Reason for unsuccessful TCM follow-up call:  Left voice message on # [phone]. Call back requested to this CM

## 2021-01-08 NOTE — Telephone Encounter (Signed)
Pt states that she is doing better since being discharged from the hospital.  Pt is tolerating PO intake better by consuming at least 1.5 protein shakes, water and broth.  Pt states that she was able to consume approximately 30 oz of fluid yesterday. Pt encouraged to continue to sip throughout the day and to meet protein and fluid goal daily.  Will continue to monitor as needed.

## 2021-01-08 NOTE — Progress Notes (Signed)
2 Week Post-Operative Nutrition Class   Patient was seen on 01/08/2021 for Post-Operative Nutrition education at [facility]    Surgery date: 12/24/2020 Surgery type: sleeve Start weight at [facility]: 242.6 Weight today: 240.6 pounds Bowel Habits: Every day to every other day no complaints   Body Composition Scale 240.6  Current Body Weight 43.7  Total Body Fat % 14  Visceral Fat 56.2  Fat-Free Mass % 42.6   Total Body Water % 32.4  Muscle-Mass lbs 39.7  BMI   Body Fat Displacement          Torso  lbs 65.2         Left Leg  lbs 13         Right Leg  lbs 13         Left Arm  lbs 6.5         Right Arm   lbs 6.5      The following the learning objectives were met by the patient during this course: Identifies Phase 3 (Soft, High Proteins) Dietary Goals and will begin from 2 weeks post-operatively to 2 months post-operatively Identifies appropriate sources of fluids and proteins  Identifies appropriate fat sources and healthy verses unhealthy fat types   States protein recommendations and appropriate sources post-operatively Identifies the need for appropriate texture modifications, mastication, and bite sizes when consuming solids Identifies appropriate fat consumption and sources Identifies appropriate multivitamin and calcium sources post-operatively Describes the need for physical activity post-operatively and will follow MD recommendations States when to call healthcare provider regarding medication questions or post-operative complications   Handouts given during class include: Phase 3A: Soft, High Protein Diet Handout Phase 3 High Protein Meals Healthy Fats   Follow-Up Plan: Patient will follow-up at [facility] in 6 weeks for 2 month post-op nutrition visit for diet advancement per MD.

## 2021-01-09 ENCOUNTER — Telehealth: Payer: Self-pay

## 2021-01-09 NOTE — Telephone Encounter (Signed)
Transition Care Management Follow-up Telephone Call Date of [facility] and [facility]  How have you been since you were released from the hospital? She said she just ate and she is feeling nauseous.  Any questions or concerns? Yes- she received the glucometer and lancets but no test strips.  I called her Product/process development scientist, spoke to [place] who said that they received the order for test strips but it states to test as needed. They need specific frequency for testing before they can fill it.   Items Reviewed: Did the pt receive and understand the discharge instructions provided? Yes  Medications obtained and verified? Yes  - yes, she said she has all medications and she did not have any questions about her med regime,  Other? No  Any new allergies since your discharge? No  Do you have support at home? Yes   Home Care and Equipment/Supplies: Were home health services ordered? no If so, what is the name of the agency? N/a  Has the agency set up a time to come to the patient's home? not applicable Were any new equipment or medical supplies ordered?  No What is the name of the medical supply agency? N/a Were you able to get the supplies/equipment? not applicable Do you have any questions related to the use of the equipment or supplies? No  Functional Questionnaire: (I = Independent and D = Dependent) ADLs: independent  Follow up appointments reviewed:  PCP Hospital f/u appt confirmed?  She said she will call when she is ready to schedule an appointment    Specialist Hospital f/u appt confirmed? Yes  Scheduled to see surgeon but she did not provide the date. Per the [facility] call on 12/26/2020, the surgical follow up is scheduled for 01/17/2021.  Are transportation arrangements needed? No  If their condition worsens, is the pt aware to call PCP or go to the Emergency Dept.? Yes Was the patient provided with contact information for the PCP's office or ED? Yes Was to pt  encouraged to call back with questions or concerns? Yes

## 2021-01-14 ENCOUNTER — Telehealth: Payer: Self-pay | Admitting: Skilled Nursing Facility1

## 2021-01-14 NOTE — Telephone Encounter (Signed)
RD called pt to verify fluid intake once starting soft, solid proteins 2 week post-bariatric surgery.   Daily Fluid intake:  Daily Protein intake: Bowel Habits:   Concerns/issues:    LVM 

## 2021-02-19 ENCOUNTER — Encounter: Payer: Medicare Other | Attending: Surgery | Admitting: Skilled Nursing Facility1

## 2021-02-19 ENCOUNTER — Other Ambulatory Visit: Payer: Self-pay

## 2021-02-19 DIAGNOSIS — E1140 Type 2 diabetes mellitus with diabetic neuropathy, unspecified: Secondary | ICD-10-CM | POA: Insufficient documentation

## 2021-02-19 DIAGNOSIS — E669 Obesity, unspecified: Secondary | ICD-10-CM | POA: Insufficient documentation

## 2021-02-19 NOTE — Progress Notes (Signed)
Bariatric Nutrition Follow-Up Visit Medical Nutrition Therapy   NUTRITION ASSESSMENT    Surgery date: 12/24/2020 Surgery type: sleeve Start weight at [facility]: 242.6 Weight today: 226.6 pounds   Body Composition Scale 240.6 02/19/2021  Current Body Weight 43.7 226.6  Total Body Fat % 14 42.1  Visceral Fat 56.2 13  Fat-Free Mass % 42.6 57.8   Total Body Water % 32.4 43.4  Muscle-Mass lbs 39.7 32.3  BMI  37.3  Body Fat Displacement           Torso  lbs 65.2 59.1         Left Leg  lbs 13 11.8         Right Leg  lbs 13 11.8         Left Arm  lbs 6.5 5.9         Right Arm   lbs 6.5 5.9   Clinical  Medical hx: diabetes, fatty liver, IBS-D Medications: metformin, vitamin D, rybelsus  Labs:  Notable signs/symptoms: headaches, gait instability  Any previous deficiencies? Vitamin D   Lifestyle & Dietary Hx  Pt states she has an app which reminds her to wind down and go to sleep for proper sleep habits.   Estimated daily fluid intake: unknown oz Estimated daily protein intake: 60 g Supplements: multi and calcium Current average weekly physical activity: gym 3 times a week 2.5 hours stating she is active the entire time states she does 40 minutes of cardio and the rest doing weights.   24-Hr Dietary Recall First Meal 9am: egg + sausage + protein shake Snack:   Second Meal: water Snack:   Third Meal 5:pm: baked chicken fish Snack:  Beverages: water, vital water, protein shake  Post-Op Goals/ Signs/ Symptoms Using straws: no Drinking while eating: no Chewing/swallowing difficulties: no Changes in vision: no Changes to mood/headaches: no Hair loss/changes to skin/nails: no Difficulty focusing/concentrating: no Sweating: no Limb weakness: no Dizziness/lightheadedness: no Palpitations: no  Carbonated/caffeinated beverages: no N/V/D/C/Gas: no Abdominal pain: no Dumping syndrome: no    NUTRITION DIAGNOSIS  Overweight/obesity ([place]-3.3) related to past poor dietary habits  and physical inactivity as evidenced by completed bariatric surgery and following dietary guidelines for continued weight loss and healthy nutrition status.     NUTRITION INTERVENTION Nutrition counseling (C-1) and education (E-2) to facilitate bariatric surgery goals, including: Diet advancement to the next phase (phase 4) now including non starchy vegetables The importance of consuming adequate calories as well as certain nutrients daily due to the body's need for essential vitamins, minerals, and fats The importance of daily physical activity and to reach a goal of at least 150 minutes of moderate to vigorous physical activity weekly (or as directed by their physician) due to benefits such as increased musculature and improved lab values The importance of intuitive eating specifically learning hunger-satiety cues and understanding the importance of learning a new body: The importance of mindful eating to avoid grazing behaviors   Goals: -measure out your fluid and limit to 80 ounces to ensure you eat a meal in the middle of the day -Continue to aim for a minimum of 64 fluid ounces 7 days a week with at least 30 ounces being plain water  -Eat non-starchy vegetables 2 times a day 7 days a week  -Start out with soft cooked vegetables today and tomorrow; if tolerated begin to eat raw vegetables or cooked including salads  -Eat your 3 ounces of protein first then start in on your non-starchy vegetables; once you  understand how much of your meal leads to satisfaction and not full while still eating 3 ounces of protein and non-starchy vegetables you can eat them in any order   -Continue to aim for 30 minutes of activity at least 5 times a week  -Do NOT cook with/add to your food: alfredo sauce, cheese sauce, barbeque sauce, ketchup, fat back, butter, bacon grease, grease, Crisco, OR SUGAR   Handouts Provided Include  Phase 4  Learning Style & Readiness for Change Teaching method utilized:  Visual & Auditory  Demonstrated degree of understanding via: Teach Back  Readiness Level: action Barriers to learning/adherence to lifestyle change: none identified   RD's Notes for Next Visit Assess adherence to pt chosen goals     MONITORING & EVALUATION Dietary intake, weekly physical activity, body weight  Next Steps Patient is to follow-up in April

## 2021-02-21 ENCOUNTER — Other Ambulatory Visit: Payer: Self-pay

## 2021-02-21 ENCOUNTER — Encounter: Payer: Self-pay | Admitting: Neurology

## 2021-02-21 ENCOUNTER — Ambulatory Visit: Payer: Commercial Managed Care - HMO | Attending: Critical Care Medicine | Admitting: Critical Care Medicine

## 2021-02-21 ENCOUNTER — Encounter: Payer: Self-pay | Admitting: Critical Care Medicine

## 2021-02-21 ENCOUNTER — Telehealth: Payer: Self-pay | Admitting: Critical Care Medicine

## 2021-02-21 VITALS — BP 107/75 | Resp 16 | Wt 227.6 lb

## 2021-02-21 DIAGNOSIS — K5900 Constipation, unspecified: Secondary | ICD-10-CM

## 2021-02-21 DIAGNOSIS — E1169 Type 2 diabetes mellitus with other specified complication: Secondary | ICD-10-CM | POA: Diagnosis not present

## 2021-02-21 DIAGNOSIS — G43809 Other migraine, not intractable, without status migrainosus: Secondary | ICD-10-CM

## 2021-02-21 DIAGNOSIS — E1140 Type 2 diabetes mellitus with diabetic neuropathy, unspecified: Secondary | ICD-10-CM

## 2021-02-21 DIAGNOSIS — R21 Rash and other nonspecific skin eruption: Secondary | ICD-10-CM

## 2021-02-21 DIAGNOSIS — R296 Repeated falls: Secondary | ICD-10-CM

## 2021-02-21 DIAGNOSIS — F411 Generalized anxiety disorder: Secondary | ICD-10-CM

## 2021-02-21 DIAGNOSIS — R911 Solitary pulmonary nodule: Secondary | ICD-10-CM | POA: Diagnosis not present

## 2021-02-21 DIAGNOSIS — E785 Hyperlipidemia, unspecified: Secondary | ICD-10-CM

## 2021-02-21 DIAGNOSIS — G5601 Carpal tunnel syndrome, right upper limb: Secondary | ICD-10-CM | POA: Insufficient documentation

## 2021-02-21 DIAGNOSIS — J45909 Unspecified asthma, uncomplicated: Secondary | ICD-10-CM

## 2021-02-21 DIAGNOSIS — R29898 Other symptoms and signs involving the musculoskeletal system: Secondary | ICD-10-CM

## 2021-02-21 DIAGNOSIS — Z6837 Body mass index (BMI) 37.0-37.9, adult: Secondary | ICD-10-CM

## 2021-02-21 LAB — POCT GLYCOSYLATED HEMOGLOBIN (HGB A1C): HbA1c, POC (controlled diabetic range): 5.9 % (ref 0.0–7.0)

## 2021-02-21 LAB — GLUCOSE, POCT (MANUAL RESULT ENTRY): POC Glucose: 104 mg/dl — AB (ref 70–99)

## 2021-02-21 MED ORDER — HYDROXYZINE PAMOATE 25 MG PO CAPS
25.0000 mg | ORAL_CAPSULE | Freq: Three times a day (TID) | ORAL | 1 refills | Status: DC | PRN
Start: 2021-02-21 — End: 2022-09-04

## 2021-02-21 MED ORDER — ALBUTEROL SULFATE (2.5 MG/3ML) 0.083% IN NEBU: 2.5000 mg | INHALATION_SOLUTION | Freq: Four times a day (QID) | RESPIRATORY_TRACT | 1 refills | Status: DC | PRN

## 2021-02-21 MED ORDER — CLINDAMYCIN PHOS-BENZOYL PEROX 1-5 % EX GEL: Freq: Two times a day (BID) | CUTANEOUS | 0 refills | Status: DC

## 2021-02-21 MED ORDER — PANTOPRAZOLE SODIUM 40 MG PO TBEC: 40.0000 mg | DELAYED_RELEASE_TABLET | Freq: Every day | ORAL | 2 refills | Status: DC

## 2021-02-21 MED ORDER — TRAZODONE HCL 100 MG PO TABS: 100.0000 mg | ORAL_TABLET | Freq: Every evening | ORAL | 1 refills | Status: DC | PRN

## 2021-02-21 MED ORDER — ALBUTEROL SULFATE HFA 108 (90 BASE) MCG/ACT IN AERS: 2.0000 | INHALATION_SPRAY | Freq: Four times a day (QID) | RESPIRATORY_TRACT | 2 refills | Status: DC | PRN

## 2021-02-21 NOTE — Assessment & Plan Note (Signed)
Discontinue Topomax/topiramate as it is not helping migraines; referral to neurology for further workup/assessment.

## 2021-02-21 NOTE — Progress Notes (Signed)
Established Patient Office Visit  Subjective:  Patient ID: Marisa [name], female    DOB: March 05, 1975  Age: 46 y.o. MRN: [id number]  CC:  Chief Complaint  Patient presents with   Diabetes   Numbness    HPI Marisa [name] is a 46 y.o. female who presents for follow up visit today. She has noticed some worsening of her neuropathy recently, with pain in her right leg and foot. She notes that her carpal tunnel syndrome in her right hand has worsened over the past six months as well which concerns her. She takes Gabapentin for this which helps. Despite these symptoms, her diabetes appears markedly improved, with her A1C at today's visit at 5.4% and her point of care blood sugar at 104 mg/dL. She is not taking any medications for diabetes at this point in time.   Of note, Marisa [name] had a gastrectomy sleeve bariatric surgery procedure on 12/24/2020. She states she is doing well at this point post-operatively and she has followed up with her surgeon and nutritionist. She continues to take ondansetron for nausea. She says she mostly needs this when she is taking the vitamin supplements that were given to her after surgery, as the vitamins make her nauseous. She would like refills on that today. She has also had some issues with constipation since her surgery. She has a very hard bowel movement every other day and she has to strain. Her bowel movements are painful and are accompanied by rectal bleeding. She says she is drinking lots of fluids and has been taking Miralax mixed in water for this.  Marisa [name]'s migraine headaches have not improved since she was last seen in office. She continues to have bad headaches about every other day, which she says are very painful and require her to lie down. Topomax was prescribed for this condition, but does not seem to improve her symptoms at all.   She states that she has been much more anxious lately. She says that the Trazadone helps her with sleep, but  she would like something else to help her manage her anxiety better.  [name] is also concerned about a 1 cm lung nodule that was found incidentally on CT on 01/04/2021 during a post op ED visit for nausea and chest pain  CT Angio was done to r/o PE and nodule found incidentally.  She is a former smoker and has had exposure to secondhand smoke. She is due for another CT scan of the lung at this time.   Past Medical History:  Diagnosis Date   Abdominal pain 04/26/2013   Abnormal uterine bleeding (AUB) 10/11/2012   Acute bronchitis    Allergy    Anal pain    chronic   Anemia    Anxiety    Asthma    exacerbation 02-28-2014 and 02-23-2014 secondary to Rhinovirus   Atypical chest pain 04/26/2013   Benign neoplasm of sigmoid colon    Benign neoplasm of transverse colon    Carbuncle of labium 07/12/2015   Chest pain 02/20/2014   Chronic diarrhea    Chronic headaches    Chronic low back pain    Cigarette nicotine dependence without complication 07/68/0881   Cyst of right ovary    Dandruff 03/26/2015   Diabetes mellitus without complication ([place]) 12/18/5943   Diabetic neuropathy, painful ([place]) 12/13/2019   Difficult intravenous access    PER PT NEEDS PICC LINE   Dyspnea 09/07/2012   [name] 08/2012:  No obstruction by FEV1%, but probable restriction.  Falls 03/27/2014   Food allergy    Mushrooms, shellfish   GAD (generalized anxiety disorder) 05/04/2014   Gait disturbance 04/11/2014   Gait instability    GERD (gastroesophageal reflux disease)    History of adenomatous polyp of colon    History of cardiac arrest    during SVD 1992   History of ectopic pregnancy    2009-  S/P LEFT SALPINGECTOMY   History of panic attacks    Hyperlipidemia    IBS (irritable bowel syndrome)    Insomnia 03/06/2014   Joint pain    Lower extremity edema    Lumbar stenosis L4 -- L5 with bulging disk   w/ right leg weakness/ decreased mobility   Migraine variant with headache 05/09/2014   Mild  obstructive sleep apnea    study 03-20-2014  no cpap recommended   Neuromuscular disorder (HCC)    neuropathy in feet    Neuropathic pain of both legs 06/04/2016   Obesity (BMI 30-39.9) 03/05/2018   OSA (obstructive sleep apnea) 03/06/2014   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   Panniculitis 03/05/2018   Pelvic pain 07/25/2013   Persistent vomiting 04/27/2013   Pneumonia    PTSD (post-traumatic stress disorder) 05/04/2014   Rash and nonspecific skin eruption 07/12/2015   Rectal bleeding 07/25/2013   RLQ abdominal pain    S/P Total vaginal hysterectomy on 01/06/13 01/06/2013   Sleep apnea    mild no cpap   Social anxiety disorder 05/04/2014   Sore throat 02/20/2014   Stomach ulcer    Tachycardia 02/20/2014   Type 2 diabetes mellitus (HCC)    Weakness of right leg    FROM BACK PROBLEM PER PT    Past Surgical History:  Procedure Laterality Date   ABDOMINAL HYSTERECTOMY     partial   COLONOSCOPY Left 04/29/2013   Procedure: COLONOSCOPY;  Surgeon: Arta Silence, MD;  Location: [facility];  Service: Endoscopy;  Laterality: Left;   COLONOSCOPY     COLONOSCOPY WITH PROPOFOL N/A 08/01/2016   Procedure: COLONOSCOPY WITH PROPOFOL;  Surgeon: Doran Stabler, MD;  Location: [facility];  Service: Gastroenterology;  Laterality: N/A;   ECTOPIC PREGNANCY SURGERY     ESOPHAGOGASTRODUODENOSCOPY (EGD) WITH PROPOFOL N/A 11/10/2018   Procedure: ESOPHAGOGASTRODUODENOSCOPY (EGD) WITH PROPOFOL;  Surgeon: Doran Stabler, MD;  Location: [facility];  Service: Gastroenterology;  Laterality: N/A;   EVALUATION UNDER ANESTHESIA WITH FISTULECTOMY N/A 04/20/2014   Procedure: EXAM UNDER ANESTHESIA ;  Surgeon: Leighton Ruff, MD;  Location[facility];  Service: General;  Laterality: N/A;   FLEXIBLE SIGMOIDOSCOPY N/A 11/09/2013   Procedure: FLEXIBLE SIGMOIDOSCOPY;  Surgeon: Arta Silence, MD;  Location: [facility];  Service: Endoscopy;  Laterality: N/A;   fupa removal       LAPAROSCOPIC CHOLECYSTECTOMY  2005   LAPAROSCOPIC GASTRIC SLEEVE RESECTION N/A 12/24/2020   Procedure: LAPAROSCOPIC GASTRIC SLEEVE RESECTION;  Surgeon: [name], MD;  Location: [facility];  Service: General;  Laterality: N/A;   REFRACTIVE SURGERY     SPHINCTEROTOMY N/A 04/20/2014   Procedure:  LATERAL INTERNAL SPHINCTEROTOMY;  Surgeon: Leighton Ruff, MD;  [facility];  Service: General;  Laterality: N/A;   TRANSTHORACIC ECHOCARDIOGRAM  12/30/2012   mild LVH/  ef 55-60%   UNILATERAL SALPINGECTOMY  2009   laparotomy left salpingectomy-- ectopic preg.   UPPER GASTROINTESTINAL ENDOSCOPY     UPPER GI ENDOSCOPY N/A 12/24/2020   Procedure: UPPER GI ENDOSCOPY;  Surgeon: [name], MD;  Location: [facility];  Service: General;  Laterality: N/A;   VAGINAL HYSTERECTOMY N/A 01/06/2013   Procedure: HYSTERECTOMY VAGINAL;  Surgeon: Osborne Oman, MD;  Location: [facility];  Service: Gynecology;  Laterality: N/A;    Family History  Problem Relation Age of Onset   Hypertension Mother    Diabetes Mother    Allergies Mother    Heart disease Mother    Clotting disorder Mother    Stroke Mother    Kidney disease Mother    Thyroid disease Mother    Cancer Father    Hyperlipidemia Father    Hypertension Father    Colon cancer Father    Liver disease Father    Heart disease Maternal Grandmother    Breast cancer Maternal Grandmother 72   Schizophrenia Sister    Bipolar disorder Sister    Clotting disorder Sister    Bipolar disorder Brother    Kidney disease Brother    Bipolar disorder Sister    Pancreatic cancer Maternal Aunt    Prostate cancer Maternal Uncle    Liver cancer Maternal Grandfather    Rectal cancer Maternal Grandfather    Liver cancer Paternal Grandfather    Colon polyps Neg Hx    Esophageal cancer Neg Hx    Stomach cancer Neg Hx     Social History   Socioeconomic History   Marital status: Married    Spouse name: Not on file   Number of  children: 1   Years of education: Not on file   Highest education level: Not on file  Occupational History   Occupation: disability  Tobacco Use   Smoking status: Former    Packs/day: 0.20    Years: 11.00    Pack years: 2.20    Types: Cigarettes    Quit date: 11/17/2013    Years since quitting: 7.2   Smokeless tobacco: Never   Tobacco comments:    2 years quit  Vaping Use   Vaping Use: Never used  Substance and Sexual Activity   Alcohol use: No    Alcohol/week: 0.0 standard drinks   Drug use: No   Sexual activity: Yes    Birth control/protection: Surgical  Other Topics Concern   Not on file  Social History Narrative   Lives with sister   Drinks no caffeine   Social Determinants of Health   Financial Resource Strain: Not on file  Food Insecurity: Not on file  Transportation Needs: Not on file  Physical Activity: Not on file  Stress: Not on file  Social Connections: Not on file  Intimate Partner Violence: Not on file    Outpatient Medications Prior to Visit  Medication Sig Dispense Refill   Accu-Chek Softclix Lancets lancets Use as instructed 100 each 12   Blood Glucose Monitoring Suppl (ACCU-CHEK GUIDE) w/Device KIT Check blood sugars daily 1 kit 0   EPINEPHrine 0.3 mg/0.3 mL IJ SOAJ injection Inject 0.3 mg into the muscle as needed for anaphylaxis.     gabapentin (NEURONTIN) 100 MG capsule Take 2 capsules (200 mg total) by mouth 2 (two) times daily. 120 capsule 3   glucose blood (ACCU-CHEK GUIDE) test strip Use as instructed 100 each 12   Lancets Misc. (ACCU-CHEK SOFTCLIX LANCET DEV) KIT Use to check blood sugar 1 kit 0   ondansetron (ZOFRAN-ODT) 4 MG disintegrating tablet Dissolve 1 tablet (4 mg total) by mouth every 6 (six) hours as needed for nausea or vomiting. 20 tablet 1   albuterol (PROVENTIL HFA) 108 (90 Base) MCG/ACT inhaler  Inhale 2 puffs into the lungs every 6 (six) hours as needed for wheezing or shortness of breath. 6.7 g 2   pantoprazole (PROTONIX) 40  MG tablet Take 1 tablet (40 mg total) by mouth daily. 90 tablet 0   topiramate (TOPAMAX) 25 MG tablet Take 1 tablet (25 mg total) by mouth 2 (two) times daily. 60 tablet 3   traZODone (DESYREL) 100 MG tablet Take 100-200 mg by mouth at bedtime as needed for sleep.     No facility-administered medications prior to visit.    Allergies  Allergen Reactions   Asa [Aspirin] Anaphylaxis and Hives    Hives, chest tightness    Mushroom Extract Complex Anaphylaxis, Swelling and Other (See Comments)    Reaction:  Eye swelling   Penicillins Anaphylaxis and Other (See Comments)    Has patient had a PCN reaction causing immediate rash, facial/tongue/throat swelling, SOB or lightheadedness with hypotension: Yes Has patient had a PCN reaction causing severe rash involving mucus membranes or skin necrosis: No Has patient had a PCN reaction that required hospitalization No Has patient had a PCN reaction occurring within the last 10 years: No If all of the above answers are "NO", then may proceed with Cephalosporin use.   Shellfish Allergy Anaphylaxis   Triamcinolone Other (See Comments)    Skin issues     ROS Review of Systems  Constitutional:  Negative for fatigue and fever.  HENT: Negative.    Eyes:  Positive for pain (dryness on right eyelid) and itching (right eye, feels deep inside).  Respiratory:  Positive for chest tightness and shortness of breath (inhaler helps, steam helps).   Cardiovascular:  Positive for chest pain (stabbing while breathing).  Gastrointestinal:  Positive for anal bleeding, constipation, nausea (with vitamins) and rectal pain.  Endocrine: Positive for polyuria. Negative for cold intolerance.  Genitourinary: Negative.   Musculoskeletal:  Positive for myalgias.  Skin:  Positive for rash.  Neurological:  Positive for weakness (fell when trying to walk recently), numbness (right finger; as well as leg pain) and headaches (worsening migraines).  Psychiatric/Behavioral:   Negative for suicidal ideas.      Objective:    Physical Exam Vitals reviewed.  Constitutional:      Appearance: Normal appearance. She is obese.  HENT:     Head: Normocephalic and atraumatic.  Cardiovascular:     Rate and Rhythm: Normal rate and regular rhythm.     Pulses:          Dorsalis pedis pulses are 2+ on the right side and 2+ on the left side.       Posterior tibial pulses are 2+ on the right side and 2+ on the left side.     Heart sounds: No murmur heard.   No friction rub. No gallop.  Pulmonary:     Effort: Pulmonary effort is normal.     Breath sounds: Normal breath sounds. No wheezing or rhonchi.  Musculoskeletal:     Right lower leg: No edema.     Left lower leg: No edema.  Feet:     Right foot:     Skin integrity: Skin integrity normal.     Left foot:     Skin integrity: Skin integrity normal.  Skin:    General: Skin is warm and dry.     Findings: Rash (mild follicular type rash on right lower leg) present.  Neurological:     General: No focal deficit present.     Mental Status: She is alert.  Mental status is at baseline.  Psychiatric:        Attention and Perception: Attention normal.        Mood and Affect: Mood normal.        Speech: Speech normal.        Behavior: Behavior normal.        Thought Content: Thought content normal.        Cognition and Memory: Cognition normal.    BP 107/75    Resp 16    Wt 227 lb 9.6 oz (103.2 kg)    LMP 11/27/2012    SpO2 100%    BMI 37.87 kg/m  Wt Readings from Last 3 Encounters:  02/21/21 227 lb 9.6 oz (103.2 kg)  02/19/21 226 lb 9.6 oz (102.8 kg)  01/08/21 240 lb 9.6 oz (109.1 kg)     Health Maintenance Due  Topic Date Due   Pneumococcal Vaccine 75-30 Years old (1 - PCV) Never done   COVID-19 Vaccine (3 - Moderna risk series) 09/12/2019   INFLUENZA VACCINE  09/17/2020   OPHTHALMOLOGY EXAM  01/23/2021   URINE MICROALBUMIN  04/09/2021    There are no preventive care reminders to display for this  patient.  Lab Results  Component Value Date   TSH 0.740 03/03/2019   Lab Results  Component Value Date   WBC 5.5 01/05/2021   HGB 13.0 01/05/2021   HCT 40.1 01/05/2021   MCV 95.9 01/05/2021   PLT 331 01/05/2021   Lab Results  Component Value Date   NA 138 01/05/2021   K 3.9 01/05/2021   CO2 23 01/05/2021   GLUCOSE 83 01/05/2021   BUN 7 01/05/2021   CREATININE 0.64 01/05/2021   BILITOT 1.0 01/04/2021   ALKPHOS 68 01/04/2021   AST 75 (H) 01/04/2021   ALT 114 (H) 01/04/2021   PROT 8.4 (H) 01/04/2021   ALBUMIN 4.4 01/04/2021   CALCIUM 8.2 (L) 01/05/2021   ANIONGAP 11 01/05/2021   EGFR 103 01/02/2021   Lab Results  Component Value Date   CHOL 178 11/25/2019   Lab Results  Component Value Date   HDL 37 (L) 11/25/2019   Lab Results  Component Value Date   LDLCALC 105 (H) 11/25/2019   Lab Results  Component Value Date   TRIG 209 (H) 11/25/2019   Lab Results  Component Value Date   CHOLHDL 4.8 (H) 11/25/2019   Lab Results  Component Value Date   HGBA1C 5.9 02/21/2021      Assessment & Plan:   Problem List Items Addressed This Visit       Cardiovascular and Mediastinum   Migraine variant with headache    Discontinue Topomax/topiramate as it is not helping migraines; referral to neurology for further workup/assessment.      Relevant Medications   traZODone (DESYREL) 100 MG tablet   Other Relevant Orders   Ambulatory referral to Neurology     Respiratory   Intrinsic asthma    Refills provided for albuterol nebulizer and HFA treatments.      Relevant Medications   albuterol (PROVENTIL HFA) 108 (90 Base) MCG/ACT inhaler   albuterol (PROVENTIL) (2.5 MG/3ML) 0.083% nebulizer solution     Endocrine   Type 2 diabetes mellitus with diabetic neuropathy, without long-term current use of insulin (HCC) - Primary    Excellent improvement in HgbA1C - now 5.4%. Continue with lifestyle recommendations as per RD and post-gastrectomy diet and activity  directions Now off all diabetic medications. Referral to neurology for the worsening numbness  and neuropathy in the legs and hand.       Relevant Orders   Microalbumin / creatinine urine ratio   HgB A1c (Completed)   POCT glucose (manual entry) (Completed)   Hyperlipidemia associated with type 2 diabetes mellitus ([place])    At goal without any medication      RESOLVED: Diabetic neuropathy, painful ([place])    Not active      Relevant Orders   Ambulatory referral to Neurology     Nervous and Auditory   Weakness of both lower extremities    This is an ongoing issue   Referral to neurology      Relevant Orders   Ambulatory referral to Neurology   Carpal tunnel syndrome of right wrist    Worsening symptoms R hand Referral to ortho      Relevant Medications   traZODone (DESYREL) 100 MG tablet   hydrOXYzine (VISTARIL) 25 MG capsule   Other Relevant Orders   Ambulatory referral to Orthopedic Surgery     Musculoskeletal and Integument   Rash and nonspecific skin eruption    Folliculitis type rash on right shin; rx for benzoyl peroxide provided.        Other   GAD (generalized anxiety disorder)    Continue with Trazodone to help with sleep. Begin hydroxyzine, to be taken at bedtime for anxiety. Referral to clinical social worker for counseling appointment.      Relevant Medications   traZODone (DESYREL) 100 MG tablet   hydrOXYzine (VISTARIL) 25 MG capsule   Morbid obesity ([place])    Excellent improvement since sleeve gastrectomy; continue with diet as directed, follow up with RD as directed.      Solitary pulmonary nodule    Repeat scan of lung for further workup.      Relevant Orders   CT Chest Wo Contrast   Frequent falls    Needs consult/referral to neurology for further workup      Relevant Orders   Ambulatory referral to Neurology   Acute constipation    Constipation is most likely due to changes associated with gastrectomy surgery; continue with  recommendations from RD. Should improve as diet is advanced.       Meds ordered this encounter  Medications   pantoprazole (PROTONIX) 40 MG tablet    Sig: Take 1 tablet (40 mg total) by mouth daily.    Dispense:  90 tablet    Refill:  2   traZODone (DESYREL) 100 MG tablet    Sig: Take 1-2 tablets (100-200 mg total) by mouth at bedtime as needed for sleep.    Dispense:  60 tablet    Refill:  1   albuterol (PROVENTIL HFA) 108 (90 Base) MCG/ACT inhaler    Sig: Inhale 2 puffs into the lungs every 6 (six) hours as needed for wheezing or shortness of breath.    Dispense:  6.7 g    Refill:  2   albuterol (PROVENTIL) (2.5 MG/3ML) 0.083% nebulizer solution    Sig: Take 3 mLs (2.5 mg total) by nebulization every 6 (six) hours as needed for wheezing or shortness of breath.    Dispense:  150 mL    Refill:  1   hydrOXYzine (VISTARIL) 25 MG capsule    Sig: Take 1 capsule (25 mg total) by mouth every 8 (eight) hours as needed.    Dispense:  60 capsule    Refill:  1   clindamycin-benzoyl peroxide (BENZACLIN) gel    Sig: Apply topically 2 (two) times  daily. To affected area on right lower leg    Dispense:  25 g    Refill:  0    Follow-up:  Follow up with Dr. Joya Gaskins in 4 months. I have seen and examined this patient with the mid-level provider and agree with the above note .   Asencion Noble, MD

## 2021-02-21 NOTE — Assessment & Plan Note (Signed)
Constipation is most likely due to changes associated with gastrectomy surgery; continue with recommendations from RD. Should improve as diet is advanced.

## 2021-02-21 NOTE — Assessment & Plan Note (Addendum)
Continue with Trazodone to help with sleep. Begin hydroxyzine, to be taken at bedtime for anxiety. Referral to clinical social worker for counseling appointment.

## 2021-02-21 NOTE — Assessment & Plan Note (Signed)
Excellent improvement since sleeve gastrectomy; continue with diet as directed, follow up with RD as directed.

## 2021-02-21 NOTE — Progress Notes (Signed)
Pt states she has rough skin on her legs, [place] stating that her anxiety and migraine medication have not been working !

## 2021-02-21 NOTE — Assessment & Plan Note (Signed)
At goal without any medication

## 2021-02-21 NOTE — Assessment & Plan Note (Addendum)
Excellent improvement in HgbA1C - now 5.4%. Continue with lifestyle recommendations as per RD and post-gastrectomy diet and activity directions Now off all diabetic medications. Referral to neurology for the worsening numbness and neuropathy in the legs and hand.

## 2021-02-21 NOTE — Assessment & Plan Note (Signed)
Worsening symptoms R hand Referral to ortho

## 2021-02-21 NOTE — Assessment & Plan Note (Addendum)
Refills provided for albuterol nebulizer and HFA treatments.

## 2021-02-21 NOTE — Assessment & Plan Note (Signed)
Needs consult/referral to neurology for further workup

## 2021-02-21 NOTE — Assessment & Plan Note (Signed)
Repeat scan of lung for further workup.

## 2021-02-21 NOTE — Telephone Encounter (Signed)
Pls see this pt severe GAD high Gad 7

## 2021-02-21 NOTE — Assessment & Plan Note (Signed)
Folliculitis type rash on right shin; rx for benzoyl peroxide provided.

## 2021-02-21 NOTE — Assessment & Plan Note (Signed)
Not active 

## 2021-02-21 NOTE — Assessment & Plan Note (Signed)
This is an ongoing issue   Referral to neurology

## 2021-02-21 NOTE — Patient Instructions (Addendum)
CT scan of the chest will be obtained to follow-up your lung nodule  Referral to neurology be made for your weakness in the legs and migraine headaches  Referral to orthopedic hand will be made for your carpal tunnel  Continue to follow your progressive diets as recommended by nutritionist and general surgery as you recover from your bariatric surgery this will improve the constipation over time as you have more fiber in your diet  BenzaClin's ointment gel will be given for your right lower extremity itching this is a mild folliculitis with hair follicle and treatment  Urine sample for microalbumin will be obtained your diabetes is under excellent control off all medications with the use of the bariatric surgery congratulations  Refills on your albuterol inhaler and solution were sent to your pharmacy  Trial of hydroxyzine 3 times daily as needed for anxiety was sent to the pharmacy will also have you connect with [facility] our clinical social worker for behavioral counseling  Return to Dr. Joya Gaskins 4 months

## 2021-02-22 LAB — MICROALBUMIN / CREATININE URINE RATIO
Creatinine, Urine: 238.4 mg/dL
Microalb/Creat Ratio: 2 mg/g creat (ref 0–29)
Microalbumin, Urine: 5.8 ug/mL

## 2021-02-25 ENCOUNTER — Telehealth: Payer: Self-pay

## 2021-02-25 NOTE — Telephone Encounter (Signed)
-----   Message from Elsie Stain, MD sent at 02/24/2021  8:30 AM EST ----- Let pt know urine is normal no kidney damage from diabetes

## 2021-02-25 NOTE — Telephone Encounter (Signed)
Pt was called and is aware of results, DOB was confirmed.  ?

## 2021-02-28 ENCOUNTER — Ambulatory Visit (HOSPITAL_COMMUNITY)
Admission: RE | Admit: 2021-02-28 | Discharge: 2021-02-28 | Disposition: A | Discharge status: Home / Self Care | Payer: Medicare Other | Source: Ambulatory Visit | Attending: Critical Care Medicine | Admitting: Critical Care Medicine

## 2021-02-28 ENCOUNTER — Other Ambulatory Visit: Payer: Self-pay | Admitting: Critical Care Medicine

## 2021-02-28 ENCOUNTER — Other Ambulatory Visit: Payer: Self-pay

## 2021-02-28 DIAGNOSIS — R911 Solitary pulmonary nodule: Secondary | ICD-10-CM | POA: Insufficient documentation

## 2021-03-01 ENCOUNTER — Other Ambulatory Visit: Payer: Self-pay | Admitting: Critical Care Medicine

## 2021-03-01 DIAGNOSIS — E0789 Other specified disorders of thyroid: Secondary | ICD-10-CM | POA: Insufficient documentation

## 2021-03-01 DIAGNOSIS — E042 Nontoxic multinodular goiter: Secondary | ICD-10-CM | POA: Insufficient documentation

## 2021-03-01 MED ORDER — EPINEPHRINE 0.3 MG/0.3ML IJ SOAJ: 0.3000 mg | INTRAMUSCULAR | 1 refills | Status: DC | PRN

## 2021-03-04 ENCOUNTER — Telehealth: Payer: Self-pay

## 2021-03-04 NOTE — Telephone Encounter (Signed)
Called pt she was aware of results prior to call. Appointment for thyroid US has been made.

## 2021-03-04 NOTE — Telephone Encounter (Signed)
-----   Message from Marisa Stain, MD sent at 03/01/2021  4:01 PM EST ----- Pt aware of CT showing nodule resolved but now has left lower thyroid mass 2.5 cm  will order thyroid US, [name] Pls help to schedule this

## 2021-03-28 ENCOUNTER — Ambulatory Visit (INDEPENDENT_AMBULATORY_CARE_PROVIDER_SITE_OTHER): Payer: Medicare Other | Admitting: Orthopaedic Surgery

## 2021-03-28 ENCOUNTER — Other Ambulatory Visit: Payer: Self-pay

## 2021-03-28 DIAGNOSIS — G5601 Carpal tunnel syndrome, right upper limb: Secondary | ICD-10-CM

## 2021-03-28 NOTE — Progress Notes (Signed)
Office Visit Note   Patient: Marisa [name]           Date of Birth: 12-Aug-1975           MRN: [id number] Visit Date: 03/28/2021              Requested by: Elsie Stain, MD [address],  [place] [address] PCP: Elsie Stain, MD   Assessment & Plan: Visit Diagnoses:  1. Carpal tunnel syndrome of right wrist     Plan: Impression is right hand carpal tunnel syndrome.  We had a discussion on obtaining a new nerve conduction study/EMG to assess the severity of the median nerve.  She will follow-up with [name] once this has been completed or we will further discuss her treatment options.  She is agreeable to this plan.  Call with concerns or questions in the meantime.  Follow-Up Instructions: Return for after NCS/EMG.   Orders:  No orders of the defined types were placed in this encounter.  No orders of the defined types were placed in this encounter.     Procedures: No procedures performed   Clinical Data: No additional findings.   Subjective: Chief Complaint  Patient presents with   Right Wrist - Numbness, Weakness    HPI patient is a pleasant 46 year old right-hand-dominant female who comes in today with right hand paresthesias.  She notes a remote history of carpal tunnel syndrome in the past where she underwent cortisone injections.  These did seem to help for period of time.  Her symptoms returned about 6 months to a year ago.  She primarily complains of paresthesias throughout the thumb with near constant numbness to the tip.  Her symptoms appear to be worse when driving and at night where she frequently wakes up shaking her hands.  She has tried wearing a night splint which does not help.  She started to notice weakness to the right hand.  She also complains of having had mild but similar symptoms on the left but currently asymptomatic.  Review of Systems as detailed in HPI.  All others reviewed and are negative.   Objective: Vital Signs: LMP  11/27/2012   Physical Exam well-developed well-nourished female no acute distress.  Alert and oriented x3.  Ortho Exam right hand shows a positive Tinel at the wrist.  Negative Phalen.  No thenar atrophy.  Decreased sensation to the thumb.  Specialty Comments:  No specialty comments available.  Imaging: No new imaging   PMFS History: Patient Active Problem List   Diagnosis Date Noted   Thyroid mass of unclear etiology 03/01/2021   Solitary pulmonary nodule 02/21/2021   Frequent falls 02/21/2021   Weakness of both lower extremities 02/21/2021   Carpal tunnel syndrome of right wrist 02/21/2021   Acute constipation 02/21/2021   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   Morbid obesity ([place]) 12/24/2020   Allergic rhinitis 06/26/2020   Hyperlipidemia associated with type 2 diabetes mellitus ([place]) 04/10/2020   Chronic pain syndrome 04/09/2020   Type 2 diabetes mellitus with diabetic neuropathy, without long-term current use of insulin ([place]) 04/09/2020   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 12/13/2019   Vitamin D deficiency 04/05/2019   Class 2 severe obesity with serious comorbidity and body mass index (BMI) of 36.0 to 36.9 in adult ([place]) 04/05/2019   Obesity (BMI 30-39.9) 03/05/2018   Chronic diarrhea    Benign neoplasm of transverse colon    Benign neoplasm of sigmoid colon  Neuropathic pain of both legs 06/04/2016   Rash and nonspecific skin eruption 07/12/2015   Dandruff 03/26/2015   Migraine variant with headache 05/09/2014   GAD (generalized anxiety disorder) 05/04/2014   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   Social anxiety disorder 05/04/2014   PTSD (post-traumatic stress disorder) 05/04/2014   Depression 03/27/2014   OSA (obstructive sleep apnea) 03/06/2014   Insomnia 03/06/2014   History of cardiac arrest    S/P Total vaginal hysterectomy on 01/06/13 01/06/2013   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   Past Medical History:  Diagnosis  Date   Abdominal pain 04/26/2013   Abnormal uterine bleeding (AUB) 10/11/2012   Acute bronchitis    Allergy    Anal pain    chronic   Anemia    Anxiety    Asthma    exacerbation 02-28-2014 and 02-23-2014 secondary to Rhinovirus   Atypical chest pain 04/26/2013   Benign neoplasm of sigmoid colon    Benign neoplasm of transverse colon    Carbuncle of labium 07/12/2015   Chest pain 02/20/2014   Chronic diarrhea    Chronic headaches    Chronic low back pain    Cigarette nicotine dependence without complication 48/18/5631   Cyst of right ovary    Dandruff 03/26/2015   Diabetes mellitus without complication ([place]) 49/70/2637   Diabetic neuropathy, painful ([place]) 12/13/2019   Difficult intravenous access    PER PT NEEDS PICC LINE   Dyspnea 09/07/2012   [name] 08/2012:  No obstruction by FEV1%, but probable restriction.     Falls 03/27/2014   Food allergy    Mushrooms, shellfish   GAD (generalized anxiety disorder) 05/04/2014   Gait disturbance 04/11/2014   Gait instability    GERD (gastroesophageal reflux disease)    History of adenomatous polyp of colon    History of cardiac arrest    during SVD 1992   History of ectopic pregnancy    2009-  S/P LEFT SALPINGECTOMY   History of panic attacks    Hyperlipidemia    IBS (irritable bowel syndrome)    Insomnia 03/06/2014   Joint pain    Lower extremity edema    Lumbar stenosis L4 -- L5 with bulging disk   w/ right leg weakness/ decreased mobility   Migraine variant with headache 05/09/2014   Mild obstructive sleep apnea    study 03-20-2014  no cpap recommended   Neuromuscular disorder (HCC)    neuropathy in feet    Neuropathic pain of both legs 06/04/2016   Obesity (BMI 30-39.9) 03/05/2018   OSA (obstructive sleep apnea) 03/06/2014   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   Panniculitis 03/05/2018   Pelvic pain 07/25/2013   Persistent vomiting 04/27/2013   Pneumonia    PTSD (post-traumatic stress disorder) 05/04/2014    Rash and nonspecific skin eruption 07/12/2015   Rectal bleeding 07/25/2013   RLQ abdominal pain    S/P Total vaginal hysterectomy on 01/06/13 01/06/2013   Sleep apnea    mild no cpap   Social anxiety disorder 05/04/2014   Sore throat 02/20/2014   Stomach ulcer    Tachycardia 02/20/2014   Type 2 diabetes mellitus (HCC)    Weakness of right leg    FROM BACK PROBLEM PER PT    Family History  Problem Relation Age of Onset   Hypertension Mother    Diabetes Mother    Allergies Mother    Heart disease Mother    Clotting disorder Mother    Stroke Mother    Kidney disease  Mother    Thyroid disease Mother    Cancer Father    Hyperlipidemia Father    Hypertension Father    Colon cancer Father    Liver disease Father    Heart disease Maternal Grandmother    Breast cancer Maternal Grandmother 35   Schizophrenia Sister    Bipolar disorder Sister    Clotting disorder Sister    Bipolar disorder Brother    Kidney disease Brother    Bipolar disorder Sister    Pancreatic cancer Maternal Aunt    Prostate cancer Maternal Uncle    Liver cancer Maternal Grandfather    Rectal cancer Maternal Grandfather    Liver cancer Paternal Grandfather    Colon polyps Neg Hx    Esophageal cancer Neg Hx    Stomach cancer Neg Hx     Past Surgical History:  Procedure Laterality Date   ABDOMINAL HYSTERECTOMY     partial   COLONOSCOPY Left 04/29/2013   Procedure: COLONOSCOPY;  Surgeon: Arta Silence, MD;  Location: [facility];  Service: Endoscopy;  Laterality: Left;   COLONOSCOPY     COLONOSCOPY WITH PROPOFOL N/A 08/01/2016   Procedure: COLONOSCOPY WITH PROPOFOL;  Surgeon: Doran Stabler, MD;  Location: [facility];  Service: Gastroenterology;  Laterality: N/A;   ECTOPIC PREGNANCY SURGERY     ESOPHAGOGASTRODUODENOSCOPY (EGD) WITH PROPOFOL N/A 11/10/2018   Procedure: ESOPHAGOGASTRODUODENOSCOPY (EGD) WITH PROPOFOL;  Surgeon: Doran Stabler, MD;  [facility];  Service:  Gastroenterology;  Laterality: N/A;   EVALUATION UNDER ANESTHESIA WITH FISTULECTOMY N/A 04/20/2014   Procedure: EXAM UNDER ANESTHESIA ;  Surgeon: Leighton Ruff, MD;  Location: [facility];  Service: General;  Laterality: N/A;   FLEXIBLE SIGMOIDOSCOPY N/A 11/09/2013   Procedure: FLEXIBLE SIGMOIDOSCOPY;  Surgeon: Arta Silence, MD;  [facility];  Service: Endoscopy;  Laterality: N/A;   fupa removal      LAPAROSCOPIC CHOLECYSTECTOMY  2005   LAPAROSCOPIC GASTRIC SLEEVE RESECTION N/A 12/24/2020   Procedure: LAPAROSCOPIC GASTRIC SLEEVE RESECTION;  Surgeon: [name], MD;  Location: [facility];  Service: General;  Laterality: N/A;   REFRACTIVE SURGERY     SPHINCTEROTOMY N/A 04/20/2014   Procedure:  LATERAL INTERNAL SPHINCTEROTOMY;  Surgeon: Leighton Ruff, MD;  Location[facility];  Service: General;  Laterality: N/A;   TRANSTHORACIC ECHOCARDIOGRAM  12/30/2012   mild LVH/  ef 55-60%   UNILATERAL SALPINGECTOMY  2009   laparotomy left salpingectomy-- ectopic preg.   UPPER GASTROINTESTINAL ENDOSCOPY     UPPER GI ENDOSCOPY N/A 12/24/2020   Procedure: UPPER GI ENDOSCOPY;  Surgeon: [name], MD;  Location: [facility];  Service: General;  Laterality: N/A;   VAGINAL HYSTERECTOMY N/A 01/06/2013   Procedure: HYSTERECTOMY VAGINAL;  Surgeon: Osborne Oman, MD;  Location: [facility];  Service: Gynecology;  Laterality: N/A;   Social History   Occupational History   Occupation: disability  Tobacco Use   Smoking status: Former    Packs/day: 0.20    Years: 11.00    Pack years: 2.20    Types: Cigarettes    Quit date: 11/17/2013    Years since quitting: 7.3   Smokeless tobacco: Never   Tobacco comments:    2 years quit  Vaping Use   Vaping Use: Never used  Substance and Sexual Activity   Alcohol use: No    Alcohol/week: 0.0 standard drinks   Drug use: No   Sexual activity: Yes    Birth control/protection: Surgical

## 2021-03-29 ENCOUNTER — Other Ambulatory Visit: Payer: Self-pay

## 2021-03-29 DIAGNOSIS — G5601 Carpal tunnel syndrome, right upper limb: Secondary | ICD-10-CM

## 2021-03-29 DIAGNOSIS — M25532 Pain in left wrist: Secondary | ICD-10-CM

## 2021-04-01 ENCOUNTER — Ambulatory Visit (HOSPITAL_COMMUNITY)
Admission: RE | Admit: 2021-04-01 | Discharge: 2021-04-01 | Disposition: A | Discharge status: Home / Self Care | Payer: Medicare Other | Source: Ambulatory Visit | Attending: Critical Care Medicine | Admitting: Critical Care Medicine

## 2021-04-01 ENCOUNTER — Other Ambulatory Visit: Payer: Self-pay

## 2021-04-01 DIAGNOSIS — E0789 Other specified disorders of thyroid: Secondary | ICD-10-CM | POA: Insufficient documentation

## 2021-04-02 ENCOUNTER — Telehealth: Payer: Self-pay

## 2021-04-02 ENCOUNTER — Other Ambulatory Visit: Payer: Self-pay | Admitting: Critical Care Medicine

## 2021-04-02 ENCOUNTER — Encounter: Payer: Self-pay | Admitting: Critical Care Medicine

## 2021-04-02 DIAGNOSIS — E042 Nontoxic multinodular goiter: Secondary | ICD-10-CM

## 2021-04-02 NOTE — Telephone Encounter (Signed)
Pt was called and is aware of results, DOB was confirmed.  ?

## 2021-04-02 NOTE — Telephone Encounter (Signed)
Called pt but phone got disconnected, called back and left a vm about talking about labs

## 2021-04-02 NOTE — Telephone Encounter (Signed)
-----   Message from Elsie Stain, MD sent at 04/02/2021  6:32 AM EST ----- Call pt and tell her we need her to come in for thyroid test lab.  Tell her thyroid US shows benign nodules no biopsy needed will repeat in one year the [name]

## 2021-04-05 ENCOUNTER — Ambulatory Visit (INDEPENDENT_AMBULATORY_CARE_PROVIDER_SITE_OTHER): Payer: Medicare Other | Admitting: Neurology

## 2021-04-05 ENCOUNTER — Encounter: Payer: Self-pay | Admitting: Neurology

## 2021-04-05 ENCOUNTER — Other Ambulatory Visit: Payer: Self-pay

## 2021-04-05 VITALS — BP 121/81 | HR 79 | Ht 65.0 in | Wt 218.0 lb

## 2021-04-05 DIAGNOSIS — R202 Paresthesia of skin: Secondary | ICD-10-CM

## 2021-04-05 DIAGNOSIS — G5601 Carpal tunnel syndrome, right upper limb: Secondary | ICD-10-CM

## 2021-04-05 DIAGNOSIS — M79604 Pain in right leg: Secondary | ICD-10-CM

## 2021-04-05 NOTE — Progress Notes (Signed)
[facility] Clinic Note - Initial Visit   Date: 04/05/21  Marisa [name] MRN: [id number] DOB: May 17, 1975   Dear Dr. Joya Gaskins:  Thank you for your kind referral of Marisa [name] for consultation of right hand numbness and bilateral feet numbness. Although her history is well known to you, please allow [name] to reiterate it for the purpose of our medical record. The patient was accompanied to the clinic by self.    History of Present Illness: Marisa [name] is a 46 y.o. right-handed female with diabetes mellitus, GERD, and asthma presenting for evaluation of right hand and feet numbness.   She has right hand numbness over the past year involving the thumb, symptoms are constant.  No exacerbating or alleviating factors.  She has some weakness.  She also complains of sharp pain involving the right lower leg, which is intermittent and worse with activity.  She has localized tenderness over the front of her right shin.  She complains of numbness of the toes.  She has imbalance and has fallen 3 times over the past year. No injuries with her falls.  She uses a cane as needed. She takes gabapentin 245m- 3063mat bedtime which helps.    She previously worked with [employer], mining She lives at home with sister and son.  She does not smoke or drink alcohol.    Out-side paper records, electronic medical record, and images have been reviewed where available and summarized as:  Lab Results  Component Value Date   HGBA1C 5.9 02/21/2021   Lab Results  Component Value Date   VITKZSWFUX32 3550/09/2019   Lab Results  Component Value Date   TSH 0.740 03/03/2019   Lab Results  Component Value Date   ESRSEDRATE 51 (H) 02/10/2017    Past Medical History:  Diagnosis Date   Abdominal pain 04/26/2013   Abnormal uterine bleeding (AUB) 10/11/2012   Acute bronchitis    Allergy    Anal pain    chronic   Anemia    Anxiety    Asthma    exacerbation 02-28-2014  and 02-23-2014 secondary to Rhinovirus   Atypical chest pain 04/26/2013   Benign neoplasm of sigmoid colon    Benign neoplasm of transverse colon    Carbuncle of labium 07/12/2015   Chest pain 02/20/2014   Chronic diarrhea    Chronic headaches    Chronic low back pain    Cigarette nicotine dependence without complication 0373/22/0254 Cyst of right ovary    Dandruff 03/26/2015   Diabetes mellitus without complication ([facility]0827/07/2374 Diabetic neuropathy, painful ([place]/26/2021   Difficult intravenous access    PER PT NEEDS PICC LINE   Dyspnea 09/07/2012   SpArlyce Harman/2014:  No obstruction by FEV1%, but probable restriction.     Falls 03/27/2014   Food allergy    Mushrooms, shellfish   GAD (generalized anxiety disorder) 05/04/2014   Gait disturbance 04/11/2014   Gait instability    GERD (gastroesophageal reflux disease)    History of adenomatous polyp of colon    History of cardiac arrest    during SVD 1992   History of ectopic pregnancy    2009-  S/P LEFT SALPINGECTOMY   History of panic attacks    Hyperlipidemia    IBS (irritable bowel syndrome)    Insomnia 03/06/2014   Joint pain    Lower extremity edema    Lumbar stenosis L4 -- L5 with bulging disk   w/ right leg  weakness/ decreased mobility   Migraine variant with headache 05/09/2014   Mild obstructive sleep apnea    study 03-20-2014  no cpap recommended   Neuromuscular disorder (HCC)    neuropathy in feet    Neuropathic pain of both legs 06/04/2016   Obesity (BMI 30-39.9) 03/05/2018   OSA (obstructive sleep apnea) 03/06/2014   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   Panniculitis 03/05/2018   Pelvic pain 07/25/2013   Persistent vomiting 04/27/2013   Pneumonia    PTSD (post-traumatic stress disorder) 05/04/2014   Rash and nonspecific skin eruption 07/12/2015   Rectal bleeding 07/25/2013   RLQ abdominal pain    S/P Total vaginal hysterectomy on 01/06/13 01/06/2013   Sleep apnea    mild no cpap   Social  anxiety disorder 05/04/2014   Sore throat 02/20/2014   Stomach ulcer    Tachycardia 02/20/2014   Type 2 diabetes mellitus (HCC)    Weakness of right leg    FROM BACK PROBLEM PER PT    Past Surgical History:  Procedure Laterality Date   ABDOMINAL HYSTERECTOMY     partial   COLONOSCOPY Left 04/29/2013   Procedure: COLONOSCOPY;  Surgeon: Arta Silence, MD;  [facility];  Service: Endoscopy;  Laterality: Left;   COLONOSCOPY     COLONOSCOPY WITH PROPOFOL N/A 08/01/2016   Procedure: COLONOSCOPY WITH PROPOFOL;  Surgeon: Doran Stabler, MD;  [facility];  Service: Gastroenterology;  Laterality: N/A;   ECTOPIC PREGNANCY SURGERY     ESOPHAGOGASTRODUODENOSCOPY (EGD) WITH PROPOFOL N/A 11/10/2018   Procedure: ESOPHAGOGASTRODUODENOSCOPY (EGD) WITH PROPOFOL;  Surgeon: Doran Stabler, MD;  [facility];  Service: Gastroenterology;  Laterality: N/A;   EVALUATION UNDER ANESTHESIA WITH FISTULECTOMY N/A 04/20/2014   Procedure: EXAM UNDER ANESTHESIA ;  Surgeon: Leighton Ruff, MD;  [facility];  Service: General;  Laterality: N/A;   FLEXIBLE SIGMOIDOSCOPY N/A 11/09/2013   Procedure: FLEXIBLE SIGMOIDOSCOPY;  Surgeon: Arta Silence, MD;  [facility];  Service: Endoscopy;  Laterality: N/A;   fupa removal      LAPAROSCOPIC CHOLECYSTECTOMY  2005   LAPAROSCOPIC GASTRIC SLEEVE RESECTION N/A 12/24/2020   Procedure: LAPAROSCOPIC GASTRIC SLEEVE RESECTION;  Surgeon: [name], MD;  Location: [facility];  Service: General;  Laterality: N/A;   REFRACTIVE SURGERY     SPHINCTEROTOMY N/A 04/20/2014   Procedure:  LATERAL INTERNAL SPHINCTEROTOMY;  Surgeon: Leighton Ruff, MD;  Location[facility];  Service: General;  Laterality: N/A;   TRANSTHORACIC ECHOCARDIOGRAM  12/30/2012   mild LVH/  ef 55-60%   UNILATERAL SALPINGECTOMY  2009   laparotomy left salpingectomy-- ectopic preg.   UPPER GASTROINTESTINAL ENDOSCOPY     UPPER  GI ENDOSCOPY N/A 12/24/2020   Procedure: UPPER GI ENDOSCOPY;  Surgeon: [name], MD;  [facility]: [facility];  Service: General;  Laterality: N/A;   VAGINAL HYSTERECTOMY N/A 01/06/2013   Procedure: HYSTERECTOMY VAGINAL;  Surgeon: Osborne Oman, MD;  Location: [facility];  Service: Gynecology;  Laterality: N/A;     Medications:  Outpatient Encounter Medications as of 04/05/2021  Medication Sig   Accu-Chek Softclix Lancets lancets Use as instructed   albuterol (PROVENTIL HFA) 108 (90 Base) MCG/ACT inhaler Inhale 2 puffs into the lungs every 6 (six) hours as needed for wheezing or shortness of breath.   albuterol (PROVENTIL) (2.5 MG/3ML) 0.083% nebulizer solution Take 3 mLs (2.5 mg total) by nebulization every 6 (six) hours as needed for wheezing or shortness of breath.   Blood  Glucose Monitoring Suppl (ACCU-CHEK GUIDE) w/Device KIT Check blood sugars daily   clindamycin-benzoyl peroxide (BENZACLIN) gel Apply topically 2 (two) times daily. To affected area on right lower leg   EPINEPHrine 0.3 mg/0.3 mL IJ SOAJ injection Inject 0.3 mg into the muscle as needed for anaphylaxis.   gabapentin (NEURONTIN) 100 MG capsule Take 2 capsules (200 mg total) by mouth 2 (two) times daily.   glucose blood (ACCU-CHEK GUIDE) test strip Use as instructed   hydrOXYzine (VISTARIL) 25 MG capsule Take 1 capsule (25 mg total) by mouth every 8 (eight) hours as needed.   Lancets Misc. (ACCU-CHEK SOFTCLIX LANCET DEV) KIT Use to check blood sugar   ondansetron (ZOFRAN-ODT) 4 MG disintegrating tablet Dissolve 1 tablet (4 mg total) by mouth every 6 (six) hours as needed for nausea or vomiting.   pantoprazole (PROTONIX) 40 MG tablet Take 1 tablet (40 mg total) by mouth daily.   traZODone (DESYREL) 100 MG tablet Take 1-2 tablets (100-200 mg total) by mouth at bedtime as needed for sleep.   No facility-administered encounter medications on file as of 04/05/2021.    Allergies:  Allergies  Allergen Reactions   Asa [Aspirin]  Anaphylaxis and Hives    Hives, chest tightness    Mushroom Extract Complex Anaphylaxis, Swelling and Other (See Comments)    Reaction:  Eye swelling   Penicillins Anaphylaxis and Other (See Comments)    Has patient had a PCN reaction causing immediate rash, facial/tongue/throat swelling, SOB or lightheadedness with hypotension: Yes Has patient had a PCN reaction causing severe rash involving mucus membranes or skin necrosis: No Has patient had a PCN reaction that required hospitalization No Has patient had a PCN reaction occurring within the last 10 years: No If all of the above answers are "NO", then may proceed with Cephalosporin use.   Shellfish Allergy Anaphylaxis   Triamcinolone Other (See Comments)    Skin issues     Family History: Family History  Problem Relation Age of Onset   Hypertension Mother    Diabetes Mother    Allergies Mother    Heart disease Mother    Clotting disorder Mother    Stroke Mother    Kidney disease Mother    Thyroid disease Mother    Cancer Father    Hyperlipidemia Father    Hypertension Father    Colon cancer Father    Liver disease Father    Heart disease Maternal Grandmother    Breast cancer Maternal Grandmother 75   Schizophrenia Sister    Bipolar disorder Sister    Clotting disorder Sister    Bipolar disorder Brother    Kidney disease Brother    Bipolar disorder Sister    Pancreatic cancer Maternal Aunt    Prostate cancer Maternal Uncle    Liver cancer Maternal Grandfather    Rectal cancer Maternal Grandfather    Liver cancer Paternal Grandfather    Colon polyps Neg Hx    Esophageal cancer Neg Hx    Stomach cancer Neg Hx     Social History: Social History   Tobacco Use   Smoking status: Former    Packs/day: 0.20    Years: 11.00    Pack years: 2.20    Types: Cigarettes    Quit date: 11/17/2013    Years since quitting: 7.3   Smokeless tobacco: Never   Tobacco comments:    2 years quit  Vaping Use   Vaping Use: Never  used  Substance Use Topics   Alcohol use:  No    Alcohol/week: 0.0 standard drinks   Drug use: No   Social History   Social History Narrative   Lives with sister   Drinks no caffeine   Right Handed    Lives in a one story home     Vital Signs:  BP 121/81    Pulse 79    Ht '5\' 5"'  (1.651 m)    Wt 218 lb (98.9 kg)    LMP 11/27/2012    SpO2 99%    BMI 36.28 kg/m    Neurological Exam: MENTAL STATUS including orientation to time, place, person, recent and remote memory, attention span and concentration, language, and fund of knowledge is normal.  Speech is not dysarthric.  CRANIAL NERVES: II:  No visual field defects.   III-IV-VI: Pupils equal round and reactive to light.  Normal conjugate, extra-ocular eye movements in all directions of gaze.  No nystagmus.  No ptosis.   V:  Normal facial sensation.    VII:  Normal facial symmetry and movements.   VIII:  Normal hearing and vestibular function.   IX-X:  Normal palatal movement.   XI:  Normal shoulder shrug and head rotation.   XII:  Normal tongue strength and range of motion, no deviation or fasciculation.  MOTOR:  No atrophy, fasciculations or abnormal movements.  No pronator drift.   Upper Extremity:  Right  Left  Deltoid  5/5   5/5   Biceps  5/5   5/5   Triceps  5/5   5/5   Infraspinatus 5/5  5/5  Medial pectoralis 5/5  5/5  Wrist extensors  5/5   5/5   Wrist flexors  5/5   5/5   Finger extensors  5/5   5/5   Finger flexors  5/5   5/5   Dorsal interossei  5/5   5/5   Abductor pollicis  5/5   5/5   Tone (Ashworth scale)  0  0   Lower Extremity:  Right  Left  Hip flexors  5/5   5/5   Hip extensors  5/5   5/5   Adductor 5/5  5/5  Abductor 5/5  5/5  Knee flexors  5/5   5/5   Knee extensors  5/5   5/5   Dorsiflexors  5/5   5/5   Plantarflexors  5/5   5/5   Toe extensors  5/5   5/5   Toe flexors  5/5   5/5   Tone (Ashworth scale)  0  0   MSRs:  Right        Left                  brachioradialis 2+  2+  biceps 2+   2+  triceps 2+  2+  patellar 2+  2+  ankle jerk 2+  2+  Hoffman no  no  plantar response down  down   SENSORY:  Reduced temperature over the right thumb, otherwise normal and symmetric perception of light touch, pinprick, vibration, and temperature in the feet.  Romberg's sign absent. Tinel's sign is positive on the right.  COORDINATION/GAIT: Normal finger-to- nose-finger.  Intact rapid alternating movements bilaterally.  Gait narrow based and stable. Tandem and stressed gait intact.    IMPRESSION: Right hand paresthesias ?CTS Bilateral toe paresthesias, possible early neuropathy.  Diabetes is well-controlled, however.   Right leg pain, likely musculoskeletal  PLAN/RECOMMENDATIONS:  I will obtain NCS/EMG of the right arm and leg to better characterize whether  she has entrapment neuropathy of the hand and early neuropathy involving the feet.  Right leg pain is not characteristic for radiculopathy, EMG will be telling.   Further recommendations pending results.    Thank you for allowing me to participate in patient's care.  If I can answer any additional questions, I would be pleased to do so.    Sincerely,    Kijana Cromie K. Posey Pronto, DO

## 2021-04-05 NOTE — Patient Instructions (Signed)
Nerve testing of the right arm and leg.  Do not apply lotion, oil, or moisturizer to your skin on the day of testing.  ELECTROMYOGRAM AND NERVE CONDUCTION STUDIES (EMG/NCS) INSTRUCTIONS  How to Prepare The neurologist conducting the EMG will need to know if you have certain medical conditions. Tell the neurologist and other EMG lab personnel if you: Have a pacemaker or any other electrical medical device Take blood-thinning medications Have hemophilia, a blood-clotting disorder that causes prolonged bleeding Bathing Take a shower or bath shortly before your exam in order to remove oils from your skin. Dont apply lotions or creams before the exam.  What to Expect Youll likely be asked to change into a hospital gown for the procedure and lie down on an examination table. The following explanations can help you understand what will happen during the exam.  Electrodes. The neurologist or a technician places surface electrodes at various locations on your skin depending on where youre experiencing symptoms. Or the neurologist may insert needle electrodes at different sites depending on your symptoms.  Sensations. The electrodes will at times transmit a tiny electrical current that you may feel as a twinge or spasm. The needle electrode may cause discomfort or pain that usually ends shortly after the needle is removed. If you are concerned about discomfort or pain, you may want to talk to the neurologist about taking a short break during the exam.  Instructions. During the needle EMG, the neurologist will assess whether there is any spontaneous electrical activity when the muscle is at rest - activity that isnt present in healthy muscle tissue - and the degree of activity when you slightly contract the muscle.  He or she will give you instructions on resting and contracting a muscle at appropriate times. Depending on what muscles and nerves the neurologist is examining, he or she may ask you to change  positions during the exam.  After your EMG You may experience some temporary, minor bruising where the needle electrode was inserted into your muscle. This bruising should fade within several days. If it persists, contact your primary care doctor.

## 2021-04-05 NOTE — Telephone Encounter (Signed)
I contacted pt and scheduled appt for 05/06/21.

## 2021-04-09 ENCOUNTER — Other Ambulatory Visit: Payer: Self-pay

## 2021-04-09 ENCOUNTER — Ambulatory Visit: Payer: Medicare Other | Attending: Critical Care Medicine

## 2021-04-09 DIAGNOSIS — E042 Nontoxic multinodular goiter: Secondary | ICD-10-CM

## 2021-04-10 ENCOUNTER — Telehealth: Payer: Self-pay

## 2021-04-10 LAB — THYROID PANEL WITH TSH
Free Thyroxine Index: 3.1 (ref 1.2–4.9)
T3 Uptake Ratio: 37 % (ref 24–39)
T4, Total: 8.4 ug/dL (ref 4.5–12.0)
TSH: 0.821 u[IU]/mL (ref 0.450–4.500)

## 2021-04-10 NOTE — Telephone Encounter (Signed)
Pt was called and vm was left, Information has been sent to nurse pool.   

## 2021-04-10 NOTE — Telephone Encounter (Signed)
-----   Message from Elsie Stain, MD sent at 04/10/2021  6:38 AM EST ----- Let pt know thyroid function is normal , no indication for thyroid medications

## 2021-04-12 ENCOUNTER — Ambulatory Visit: Payer: Self-pay | Admitting: *Deleted

## 2021-04-12 NOTE — Telephone Encounter (Signed)
Please call patient back also about if should be taking atorvastatin   ----- Message from Bayard Beaver sent at 04/12/2021 10:27 AM EST -----  Katha Cabal from [facility] called in to know if pat is still supposed to be taking atavorstatin. Please call back     Call to pharmacy- will have provider review chart- to get answer to that question. Per medication history- note states stop at discharge. Will verify not to take and if patient should be taking will send Rx for that particular medication and dosing. Reason for Disposition  [1] Pharmacy calling with prescription question AND [2] triager unable to answer question  Answer Assessment - Initial Assessment Questions 1. NAME of MEDICATION: "What medicine are you calling about?"     Atorvastatin 10 mg 2. QUESTION: "What is your question?" (e.g., double dose of medicine, side effect)     Should patient be taking this medication? 3. PRESCRIBING HCP: "Who prescribed it?" Reason: if prescribed by specialist, call should be referred to that group.     PCP Pharmacy is calling for clarification on medication  Protocols used: Medication Question Call-A-AH

## 2021-04-16 NOTE — Telephone Encounter (Signed)
Called pharmacy and they are aware

## 2021-04-16 NOTE — Telephone Encounter (Signed)
fyi

## 2021-04-16 NOTE — Telephone Encounter (Signed)
I took her off statins  her lipid is at goal and A1C 5.4 without medications

## 2021-05-06 ENCOUNTER — Institutional Professional Consult (permissible substitution): Payer: Medicare Other | Admitting: Clinical

## 2021-05-07 ENCOUNTER — Ambulatory Visit (INDEPENDENT_AMBULATORY_CARE_PROVIDER_SITE_OTHER): Payer: Medicare Other | Admitting: Neurology

## 2021-05-07 ENCOUNTER — Other Ambulatory Visit: Payer: Self-pay

## 2021-05-07 DIAGNOSIS — M5412 Radiculopathy, cervical region: Secondary | ICD-10-CM | POA: Diagnosis not present

## 2021-05-07 DIAGNOSIS — M79604 Pain in right leg: Secondary | ICD-10-CM | POA: Diagnosis not present

## 2021-05-07 DIAGNOSIS — R202 Paresthesia of skin: Secondary | ICD-10-CM

## 2021-05-07 DIAGNOSIS — G5601 Carpal tunnel syndrome, right upper limb: Secondary | ICD-10-CM

## 2021-05-07 NOTE — Progress Notes (Signed)
? ? ?Follow-up Visit ? ? ?Date: 05/07/21 ? ? ?Kanyon E [name] ?MRN: [id number] ?DOB: Jun 21, 1975 ? ? ?Interim History: ?Marisa [name] is a 46 y.o. right-handed Caucasian female with diabetes mellitus, GERD, and asthma returning to the clinic for follow-up of right hand and feet numbness.  The patient was accompanied to the clinic by self. ? ?History of present illness: ?She has right hand numbness over the past year involving the thumb, symptoms are constant.  No exacerbating or alleviating factors.  She has some weakness. ?  ?She also complains of sharp pain involving the right lower leg, which is intermittent and worse with activity.  She has localized tenderness over the front of her right shin.  She complains of numbness of the toes.  She has imbalance and has fallen 3 times over the past year. No injuries with her falls.  She uses a cane as needed. She takes gabapentin 244m- 3075mat bedtime which helps.   ?  ?She previously worked with [employer] She lives at home with sister and son.  She does not smoke or drink alcohol.  ? ?UPDATE 05/07/2021:  She is here for EDX.  She continues to have constant numbness involving the right thumb and intermittent pain in the legs.  She denies significant neck pain or hand weakness. No shooting pain down the right arm.  ? ?Medications:  ?Current Outpatient Medications on File Prior to Visit  ?Medication Sig Dispense Refill  ? Accu-Chek Softclix Lancets lancets Use as instructed 100 each 12  ? albuterol (PROVENTIL HFA) 108 (90 Base) MCG/ACT inhaler Inhale 2 puffs into the lungs every 6 (six) hours as needed for wheezing or shortness of breath. 6.7 g 2  ? albuterol (PROVENTIL) (2.5 MG/3ML) 0.083% nebulizer solution Take 3 mLs (2.5 mg total) by nebulization every 6 (six) hours as needed for wheezing or shortness of breath. 150 mL 1  ? Blood Glucose Monitoring Suppl (ACCU-CHEK GUIDE) w/Device KIT Check blood sugars daily 1 kit 0  ? clindamycin-benzoyl peroxide (BENZACLIN)  gel Apply topically 2 (two) times daily. To affected area on right lower leg 25 g 0  ? EPINEPHrine 0.3 mg/0.3 mL IJ SOAJ injection Inject 0.3 mg into the muscle as needed for anaphylaxis. 1 each 1  ? gabapentin (NEURONTIN) 100 MG capsule Take 2 capsules (200 mg total) by mouth 2 (two) times daily. 120 capsule 3  ? glucose blood (ACCU-CHEK GUIDE) test strip Use as instructed 100 each 12  ? hydrOXYzine (VISTARIL) 25 MG capsule Take 1 capsule (25 mg total) by mouth every 8 (eight) hours as needed. 60 capsule 1  ? Lancets Misc. (ACCU-CHEK SOFTCLIX LANCET DEV) KIT Use to check blood sugar 1 kit 0  ? ondansetron (ZOFRAN-ODT) 4 MG disintegrating tablet Dissolve 1 tablet (4 mg total) by mouth every 6 (six) hours as needed for nausea or vomiting. 20 tablet 1  ? pantoprazole (PROTONIX) 40 MG tablet Take 1 tablet (40 mg total) by mouth daily. 90 tablet 2  ? traZODone (DESYREL) 100 MG tablet Take 1-2 tablets (100-200 mg total) by mouth at bedtime as needed for sleep. 60 tablet 1  ? ?No current facility-administered medications on file prior to visit.  ? ? ?Allergies:  ?Allergies  ?Allergen Reactions  ? Asa [Aspirin] Anaphylaxis and Hives  ?  Hives, chest tightness ?  ? Mushroom Extract Complex Anaphylaxis, Swelling and Other (See Comments)  ?  Reaction:  Eye swelling  ? Penicillins Anaphylaxis and Other (See Comments)  ?  Has  patient had a PCN reaction causing immediate rash, facial/tongue/throat swelling, SOB or lightheadedness with hypotension: Yes ?Has patient had a PCN reaction causing severe rash involving mucus membranes or skin necrosis: No ?Has patient had a PCN reaction that required hospitalization No ?Has patient had a PCN reaction occurring within the last 10 years: No ?If all of the above answers are "NO", then may proceed with Cephalosporin use.  ? Shellfish Allergy Anaphylaxis  ? Triamcinolone Other (See Comments)  ?  Skin issues   ? ? ? ?Exam deferred ? ?Data: ?NCS/EMG of the right side 05/07/2021: ?This is a  normal study of the right upper and lower extremities.  In particular, there is no evidence of a sensorimotor polyneuropathy, cervical/lumbosacral radiculopathy, or carpal tunnel syndrome ? ?IMPRESSION/PLAN: ?Right thumb numbness, no evidence of CTS on NCS/EMG.  I recommend starting neck PT to see if this could be C6 sensory radiculopathy.  If no improvement, MRI cervical spine would be the next step. ? ?Bilateral feet paresthesias, no evidence of neuropathy on EMG. Continue to follow. ? ? ?Thank you for allowing me to participate in patient's care.  If I can answer any additional questions, I would be pleased to do so.   ? ?Sincerely, ? ? ? ?Cinderella Christoffersen K. Posey Pronto, DO ? ? ?

## 2021-05-07 NOTE — Procedures (Signed)
[facility]  ?[address], [address] ? [place], [place] [address] ?Tel: [phone] ?Fax:  [phone] ?Test Date:  05/07/2021 ? ?Patient: Shenika Quint DOB: June 11, 1975 Physician: Narda Amber, DO  ?Sex: Female Height: '5\' 5"'$  Ref Phys: Narda Amber, DO  ?ID#: [id number]   Technician:   ? ?Patient Complaints: ?This is a 46 year old female referred for evaluation of right hand numbness and bilateral feet paresthesias. ? ?NCV & EMG Findings: ?Extensive electrodiagnostic testing of the right upper and lower extremity shows:  ?Right median, ulnar, mixed palmar, sural, and superficial peroneal sensory responses are within normal limits. ?Right median, ulnar, peroneal, and tibial motor responses are within normal limits. ?Right tibial H reflex study is within normal limits. ?There is no evidence of active or chronic motor axonal loss changes affecting any of the tested muscles.  Motor unit configuration and recruitment pattern is within normal limits. ? ?Impression: ?This is a normal study of the right upper and lower extremities.  In particular, there is no evidence of a sensorimotor polyneuropathy, cervical/lumbosacral radiculopathy, or carpal tunnel syndrome ? ? ?___________________________ ?Narda Amber, DO ? ? ? ?Nerve Conduction Studies ?Anti Sensory Summary Table ? ? Stim Site NR Peak (ms) Norm Peak (ms) P-T Amp (?V) Norm P-T Amp  ?Right Median Anti Sensory (2nd Digit)  32?C  ?Wrist    3.0 <3.4 35.8 >20  ?Right Sup Peroneal Anti Sensory (Ant Lat Mall)  32?C  ?12 cm    2.6 <4.5 28.3 >5  ?Right Sural Anti Sensory (Lat Mall)  32?C  ?Calf    2.6 <4.5 44.8 >5  ?Right Ulnar Anti Sensory (5th Digit)  32?C  ?Wrist    2.5 <3.1 31.1 >12  ? ?Motor Summary Table ? ? Stim Site NR Onset (ms) Norm Onset (ms) O-P Amp (mV) Norm O-P Amp Site1 Site2 Delta-0 (ms) Dist (cm) Vel (m/s) Norm Vel (m/s)  ?Right Median Motor (Abd Poll Brev)  32?C  ?Wrist    3.5 <3.9 9.3 >6 Elbow Wrist 4.5 29.0 64 >50  ?Elbow    8.0  9.1          ?Right Peroneal Motor (Ext Dig Brev)  32?C  ?Ankle    3.0 <5.5 6.9 >3 B Fib Ankle 7.1 37.0 52 >40  ?B Fib    10.1  6.5  Poplt B Fib 1.4 8.0 57 >40  ?Poplt    11.5  6.4         ?Right Tibial Motor (Abd [place] Crane Brev)  32?C  ?Ankle    2.7 <6.0 9.3 >8 Knee Ankle 8.4 41.0 49 >40  ?Knee    11.1  5.3         ?Right Ulnar Motor (Abd Dig Minimi)  32?C  ?Wrist    1.7 <3.1 7.4 >7 B Elbow Wrist 4.2 22.0 52 >50  ?B Elbow    5.9  7.1  A Elbow B Elbow 1.8 10.0 56 >50  ?A Elbow    7.7  7.1         ? ?Comparison Summary Table ? ? Stim Site NR Peak (ms) Norm Peak (ms) P-T Amp (?V) Site1 Site2 Delta-P (ms) Norm Delta (ms)  ?Right Median/Ulnar Palm Comparison (Wrist - 8cm)  32?C  ?Median Palm    1.9 <2.2 60.4 Median Palm Ulnar Palm 0.3   ?Ulnar Palm    1.6 <2.2 19.4      ? ?H Reflex Studies ? ? NR H-Lat (ms) Lat Norm (ms) L-R H-Lat (ms)  ?Right  Tibial (Gastroc)  32?C  ?   30.07 <35   ? ?EMG ? ? Side Muscle Ins Act Fibs Psw Fasc Number Recrt Dur Dur. Amp Amp. Poly Poly. Comment  ?Right 1stDorInt Nml Nml Nml Nml Nml Nml Nml Nml Nml Nml Nml Nml N/A  ?Right AntTibialis Nml Nml Nml Nml Nml Nml Nml Nml Nml Nml Nml Nml N/A  ?Right Flex Dig Long Nml Nml Nml Nml Nml Nml Nml Nml Nml Nml Nml Nml N/A  ?Right Gastroc Nml Nml Nml Nml Nml Nml Nml Nml Nml Nml Nml Nml N/A  ?Right RectFemoris Nml Nml Nml Nml Nml Nml Nml Nml Nml Nml Nml Nml N/A  ?Right GluteusMed Nml Nml Nml Nml Nml Nml Nml Nml Nml Nml Nml Nml N/A  ?Right PronatorTeres Nml Nml Nml Nml Nml Nml Nml Nml Nml Nml Nml Nml N/A  ?Right Biceps Nml Nml Nml Nml Nml Nml Nml Nml Nml Nml Nml Nml N/A  ?Right Triceps Nml Nml Nml Nml Nml Nml Nml Nml Nml Nml Nml Nml N/A  ?Right Deltoid Nml Nml Nml Nml Nml Nml Nml Nml Nml Nml Nml Nml N/A  ? ? ? ? ?Waveforms: ?    ? ?    ? ?    ? ?  ? ? ?

## 2021-05-07 NOTE — Addendum Note (Signed)
Addended by: Alda Berthold on: 05/07/2021 01:30 PM ? ? Modules accepted: Level of Service ? ?

## 2021-05-09 ENCOUNTER — Other Ambulatory Visit: Payer: Self-pay

## 2021-05-09 DIAGNOSIS — M5412 Radiculopathy, cervical region: Secondary | ICD-10-CM

## 2021-05-09 NOTE — Progress Notes (Signed)
Na

## 2021-05-10 NOTE — Addendum Note (Signed)
Addended by: Armen Pickup A on: 05/10/2021 08:18 AM ? ? Modules accepted: Orders ? ?

## 2021-05-15 ENCOUNTER — Encounter: Payer: Self-pay | Admitting: Physician Assistant

## 2021-05-15 ENCOUNTER — Ambulatory Visit (INDEPENDENT_AMBULATORY_CARE_PROVIDER_SITE_OTHER): Payer: Medicare Other | Admitting: Physician Assistant

## 2021-05-15 ENCOUNTER — Ambulatory Visit: Payer: Self-pay | Admitting: *Deleted

## 2021-05-15 DIAGNOSIS — J029 Acute pharyngitis, unspecified: Secondary | ICD-10-CM | POA: Diagnosis not present

## 2021-05-15 MED ORDER — AZITHROMYCIN 250 MG PO TABS: ORAL_TABLET | ORAL | 0 refills | Status: DC

## 2021-05-15 MED ORDER — CETIRIZINE HCL 10 MG PO TABS: 10.0000 mg | ORAL_TABLET | Freq: Every day | ORAL | 11 refills | Status: DC

## 2021-05-15 MED ORDER — BENZONATATE 200 MG PO CAPS
200.0000 mg | ORAL_CAPSULE | Freq: Two times a day (BID) | ORAL | 0 refills | Status: DC | PRN
Start: 2021-05-15 — End: 2022-07-01

## 2021-05-15 NOTE — Patient Instructions (Signed)
You are going to take azithromycin, I also encourage you to take Zyrtec on a daily basis, Tessalon Perles twice daily as needed to help with your cough.  I encourage you to get plenty of rest and drink lots of water. ? ?I hope that you feel better soon ? ?Kennieth Rad, PA-C ?Physician Assistant ?[place] ?[email or web address] ? ? ?Upper Respiratory Infection, Adult ?An upper respiratory infection (URI) is a common viral infection of the nose, throat, and upper air passages that lead to the lungs. The most common type of URI is the common cold. URIs usually get better on their own, without medical treatment. ?What are the causes? ?A URI is caused by a virus. You may catch a virus by: ?Breathing in droplets from an infected person's cough or sneeze. ?Touching something that has been exposed to the virus (is contaminated) and then touching your mouth, nose, or eyes. ?What increases the risk? ?You are more likely to get a URI if: ?You are very young or very old. ?You have close contact with others, such as at work, school, or a health care facility. ?You smoke. ?You have long-term (chronic) heart or lung disease. ?You have a weakened disease-fighting system (immune system). ?You have nasal allergies or asthma. ?You are experiencing a lot of stress. ?You have poor nutrition. ?What are the signs or symptoms? ?A URI usually involves some of the following symptoms: ?Runny or stuffy (congested) nose. ?Cough. ?Sneezing. ?Sore throat. ?Headache. ?Fatigue. ?Fever. ?Loss of appetite. ?Pain in your forehead, behind your eyes, and over your cheekbones (sinus pain). ?Muscle aches. ?Redness or irritation of the eyes. ?Pressure in the ears or face. ?How is this diagnosed? ?This condition may be diagnosed based on your medical history and symptoms, and a physical exam. Your health care provider may use a swab to take a mucus sample from your nose (nasal swab). This sample can  be tested to determine what virus is causing the illness. ?How is this treated? ?URIs usually get better on their own within 7-10 days. Medicines cannot cure URIs, but your health care provider may recommend certain medicines to help relieve symptoms, such as: ?Over-the-counter cold medicines. ?Cough suppressants. Coughing is a type of defense against infection that helps to clear the respiratory system, so take these medicines only as recommended by your health care provider. ?Fever-reducing medicines. ?Follow these instructions at home: ?Activity ?Rest as needed. ?If you have a fever, stay home from work or school until your fever is gone or until your health care provider says your URI cannot spread to other people (is no longer contagious). Your health care provider may have you wear a face mask to prevent your infection from spreading. ?Relieving symptoms ?Gargle with a mixture of salt and water 3-4 times a day or as needed. To make salt water, completely dissolve ?-1 tsp (3-6 g) of salt in 1 cup (237 mL) of warm water. ?Use a cool-mist humidifier to add moisture to the air. This can help you breathe more easily. ?Eating and drinking ? ?Drink enough fluid to keep your urine pale yellow. ?Eat soups and other clear broths. ?General instructions ? ?Take over-the-counter and prescription medicines only as told by your health care provider. These include cold medicines, fever reducers, and cough suppressants. ?Do not use any products that contain nicotine or tobacco. These products include cigarettes, chewing tobacco, and vaping devices, such as e-cigarettes. If you need help quitting, ask your health care provider. ?Stay away from secondhand smoke. ?Stay  up to date on all immunizations, including the yearly (annual) flu vaccine. ?Keep all follow-up visits. This is important. ?How to prevent the spread of infection to others ?URIs can be contagious. To prevent the infection from spreading: ?Wash your hands with soap  and water for at least 20 seconds. If soap and water are not available, use hand sanitizer. ?Avoid touching your mouth, face, eyes, or nose. ?Cough or sneeze into a tissue or your sleeve or elbow instead of into your hand or into the air. ? ?Contact a health care provider if: ?You are getting worse instead of better. ?You have a fever or chills. ?Your mucus is brown or red. ?You have yellow or brown discharge coming from your nose. ?You have pain in your face, especially when you bend forward. ?You have swollen neck glands. ?You have pain while swallowing. ?You have white areas in the back of your throat. ?Get help right away if: ?You have shortness of breath that gets worse. ?You have severe or persistent: ?Headache. ?Ear pain. ?Sinus pain. ?Chest pain. ?You have chronic lung disease along with any of the following: ?Making high-pitched whistling sounds when you breathe, most often when you breathe out (wheezing). ?Prolonged cough (more than 14 days). ?Coughing up blood. ?A change in your usual mucus. ?You have a stiff neck. ?You have changes in your: ?Vision. ?Hearing. ?Thinking. ?Mood. ?These symptoms may be an emergency. Get help right away. Call 911. ?Do not wait to see if the symptoms will go away. ?Do not drive yourself to the hospital. ?Summary ?An upper respiratory infection (URI) is a common infection of the nose, throat, and upper air passages that lead to the lungs. ?A URI is caused by a virus. ?URIs usually get better on their own within 7-10 days. ?Medicines cannot cure URIs, but your health care provider may recommend certain medicines to help relieve symptoms. ?This information is not intended to replace advice given to you by your health care provider. Make sure you discuss any questions you have with your health care provider. ?Document Revised: 09/05/2020 Document Reviewed: 09/05/2020 ?Elsevier Patient Education ? [place]. ? ?

## 2021-05-15 NOTE — Telephone Encounter (Signed)
?  Chief Complaint: SOB- chest congestion, cough ?Symptoms: congestion, cough, SOB with exertion  ?Frequency: 2 weeks ?Pertinent Negatives: Patient denies dizziness, chest pain, fever ?Disposition: '[]'$ ED /'[]'$ Urgent Care (no appt availability in office) / '[]'$ Appointment(In office/virtual)/ '[]'$  [facility]/ '[]'$ Home Care/ '[]'$ Refused Recommended Disposition /'[x]'$ [facility]/ '[]'$  Follow-up with PCP ?Additional Notes: Patient scheduled with mobile care provider  ?

## 2021-05-15 NOTE — Telephone Encounter (Signed)
Reason for Disposition ? [1] MILD difficulty breathing (e.g., minimal/no SOB at rest, SOB with walking, pulse <100) AND [2] NEW-onset or WORSE than normal ? ?Answer Assessment - Initial Assessment Questions ?1. RESPIRATORY STATUS: "Describe your breathing?" (e.g., wheezing, shortness of breath, unable to speak, severe coughing)  ?    Chest congestion- hard to breath at night, coughing ?2. ONSET: "When did this breathing problem begin?"  ?    2 weeks- negative COVID test ?3. PATTERN "Does the difficult breathing come and go, or has it been constant since it started?"  ?    SOB- with exertion ?4. SEVERITY: "How bad is your breathing?" (e.g., mild, moderate, severe)  ?  - MILD: No SOB at rest, mild SOB with walking, speaks normally in sentences, can lie down, no retractions, pulse < 100.  ?  - MODERATE: SOB at rest, SOB with minimal exertion and prefers to sit, cannot lie down flat, speaks in phrases, mild retractions, audible wheezing, pulse 100-120.  ?  - SEVERE: Very SOB at rest, speaks in single words, struggling to breathe, sitting hunched forward, retractions, pulse > 120  ?    Mild- inhaler has helped some ?5. RECURRENT SYMPTOM: "Have you had difficulty breathing before?" If Yes, ask: "When was the last time?" and "What happened that time?"  ?    Yes- asthma history, congestion- been a while- antibiotics ?6. CARDIAC HISTORY: "Do you have any history of heart disease?" (e.g., heart attack, angina, bypass surgery, angioplasty)  ?    no ?7. LUNG HISTORY: "Do you have any history of lung disease?"  (e.g., pulmonary embolus, asthma, emphysema) ?    asthma ?8. CAUSE: "What do you think is causing the breathing problem?"  ?    infection ?9. OTHER SYMPTOMS: "Do you have any other symptoms? (e.g., dizziness, runny nose, cough, chest pain, fever) ?    Cough, congestion, sinus pressure ?10. O2 SATURATION MONITOR:  "Do you use an oxygen saturation monitor (pulse oximeter) at home?" If Yes, "What is your reading (oxygen  level) today?" "What is your usual oxygen saturation reading?" (e.g., 95%) ?      *No Answer* ?11. PREGNANCY: "Is there any chance you are pregnant?" "When was your last menstrual period?" ?      *No Answer* ?12. TRAVEL: "Have you traveled out of the country in the last month?" (e.g., travel history, exposures) ?      *No Answer* ? ?Protocols used: Breathing Difficulty-A-AH ? ?

## 2021-05-15 NOTE — Progress Notes (Signed)
? ?Established Patient Office Visit ? ?Subjective:  ?Patient ID: Marisa [name], female    DOB: [birth year]  Age: 46 y.o. MRN: [id number] ? ?CC:  ?Chief Complaint  ?Patient presents with  ? URI  ? ?Virtual Visit via Telephone Note ? ?I connected with Marisa [name] on 05/15/21 at  2:00 PM EDT by telephone and verified that I am speaking with the correct person using two identifiers. ? ?Location: ?Patient: Home ?Provider: Primary Care at [facility] ?  ?I discussed the limitations, risks, security and privacy concerns of performing an evaluation and management service by telephone and the availability of in person appointments. I also discussed with the patient that there may be a patient responsible charge related to this service. The patient expressed understanding and agreed to proceed. ? ? ?History of Present Illness: ?States that she has been feeling poorly for the past two weeks, has cough with yellow sputum, pressure in her head, is having some SOB with exertion, audible wheezing, states cough is keeping her awake at night.  States appetite is low, but is eating and drinking okay.  Denies sick contacts, did take home COVID test which was negative. ? ?States that she has been using her albuterol inhaler, nebulizer treatments, NyQuil cold medication, and states that she did take leftover steroids.  States that she took 1 pill once daily for the last 3 days, states she is unsure of the dose.  States that these treatments have not been offering much relief.  Denies any antibiotic use in the last 30 days. ? ?  ?Observations/Objective: ?Medical history and current medications reviewed, no physical exam completed ? ?Past Medical History:  ?Diagnosis Date  ? Abdominal pain 04/26/2013  ? Abnormal uterine bleeding (AUB) 10/11/2012  ? Acute bronchitis   ? Allergy   ? Anal pain   ? chronic  ? Anemia   ? Anxiety   ? Asthma   ? exacerbation 02-28-2014 and 02-23-2014 secondary to Rhinovirus  ? Atypical chest pain  04/26/2013  ? Benign neoplasm of sigmoid colon   ? Benign neoplasm of transverse colon   ? Carbuncle of labium 07/12/2015  ? Chest pain 02/20/2014  ? Chronic diarrhea   ? Chronic headaches   ? Chronic low back pain   ? Cigarette nicotine dependence without complication 29/47/6546  ? Cyst of right ovary   ? Dandruff 03/26/2015  ? Diabetes mellitus without complication ([place]) 50/35/4656  ? Diabetic neuropathy, painful ([place]) 12/13/2019  ? Difficult intravenous access   ? PER PT NEEDS PICC LINE  ? Dyspnea 09/07/2012  ? [name] 08/2012:  No obstruction by FEV1%, but probable restriction.    ? Falls 03/27/2014  ? Food allergy   ? Mushrooms, shellfish  ? GAD (generalized anxiety disorder) 05/04/2014  ? Gait disturbance 04/11/2014  ? Gait instability   ? GERD (gastroesophageal reflux disease)   ? History of adenomatous polyp of colon   ? History of cardiac arrest   ? during SVD 1992  ? History of ectopic pregnancy   ? 2009-  S/P LEFT SALPINGECTOMY  ? History of panic attacks   ? Hyperlipidemia   ? IBS (irritable bowel syndrome)   ? Insomnia 03/06/2014  ? Joint pain   ? Lower extremity edema   ? Lumbar stenosis L4 -- L5 with bulging disk  ? w/ right leg weakness/ decreased mobility  ? Migraine variant with headache 05/09/2014  ? Mild obstructive sleep apnea   ? study 03-20-2014  no cpap recommended  ?  Neuromuscular disorder ([place])   ? neuropathy in feet   ? Neuropathic pain of both legs 06/04/2016  ? Obesity (BMI 30-39.9) 03/05/2018  ? OSA (obstructive sleep apnea) 03/06/2014  ? 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  ? Panniculitis 03/05/2018  ? Pelvic pain 07/25/2013  ? Persistent vomiting 04/27/2013  ? Pneumonia   ? PTSD (post-traumatic stress disorder) 05/04/2014  ? Rash and nonspecific skin eruption 07/12/2015  ? Rectal bleeding 07/25/2013  ? RLQ abdominal pain   ? S/P Total vaginal hysterectomy on 01/06/13 01/06/2013  ? Sleep apnea   ? mild no cpap  ? Social anxiety disorder 05/04/2014  ? Sore throat 02/20/2014  ?  Stomach ulcer   ? Tachycardia 02/20/2014  ? Type 2 diabetes mellitus ([place])   ? Weakness of right leg   ? FROM BACK PROBLEM PER PT  ? ? ?Past Surgical History:  ?Procedure Laterality Date  ? ABDOMINAL HYSTERECTOMY    ? partial  ? COLONOSCOPY Left 04/29/2013  ? Procedure: COLONOSCOPY;  Surgeon: Arta Silence, MD;  [facility];  Service: Endoscopy;  Laterality: Left;  ? COLONOSCOPY    ? COLONOSCOPY WITH PROPOFOL N/A 08/01/2016  ? Procedure: COLONOSCOPY WITH PROPOFOL;  Surgeon: Doran Stabler, MD;  [facility];  Service: Gastroenterology;  Laterality: N/A;  ? ECTOPIC PREGNANCY SURGERY    ? ESOPHAGOGASTRODUODENOSCOPY (EGD) WITH PROPOFOL N/A 11/10/2018  ? Procedure: ESOPHAGOGASTRODUODENOSCOPY (EGD) WITH PROPOFOL;  Surgeon: Doran Stabler, MD;  [facility];  Service: Gastroenterology;  Laterality: N/A;  ? EVALUATION UNDER ANESTHESIA WITH FISTULECTOMY N/A 04/20/2014  ? Procedure: EXAM UNDER ANESTHESIA ;  Surgeon: Leighton Ruff, MD;  Location: [facility];  Service: General;  Laterality: N/A;  ? FLEXIBLE SIGMOIDOSCOPY N/A 11/09/2013  ? Procedure: FLEXIBLE SIGMOIDOSCOPY;  Surgeon: Arta Silence, MD;  [facility];  Service: Endoscopy;  Laterality: N/A;  ? fupa removal     ? LAPAROSCOPIC CHOLECYSTECTOMY  2005  ? LAPAROSCOPIC GASTRIC SLEEVE RESECTION N/A 12/24/2020  ? Procedure: LAPAROSCOPIC GASTRIC SLEEVE RESECTION;  Surgeon: [name], MD;  [facility]: [facility];  Service: General;  Laterality: N/A;  ? REFRACTIVE SURGERY    ? SPHINCTEROTOMY N/A 04/20/2014  ? Procedure:  LATERAL INTERNAL SPHINCTEROTOMY;  Surgeon: Leighton Ruff, MD;  Location: [facility];  Service: General;  Laterality: N/A;  ? TRANSTHORACIC ECHOCARDIOGRAM  12/30/2012  ? mild LVH/  ef 55-60%  ? UNILATERAL SALPINGECTOMY  2009  ? laparotomy left salpingectomy-- ectopic preg.  ? UPPER GASTROINTESTINAL ENDOSCOPY    ? UPPER GI ENDOSCOPY N/A 12/24/2020  ? Procedure: UPPER GI  ENDOSCOPY;  Surgeon: [name], MD;  [facility];  Service: General;  Laterality: N/A;  ? VAGINAL HYSTERECTOMY N/A 01/06/2013  ? Procedure: HYSTERECTOMY VAGINAL;  Surgeon: Osborne Oman, MD;  Location: [facility];  Service: Gynecology;  Laterality: N/A;  ? ? ?Family History  ?Problem Relation Age of Onset  ? Hypertension Mother   ? Diabetes Mother   ? Allergies Mother   ? Heart disease Mother   ? Clotting disorder Mother   ? Stroke Mother   ? Kidney disease Mother   ? Thyroid disease Mother   ? Cancer Father   ? Hyperlipidemia Father   ? Hypertension Father   ? Colon cancer Father   ? Liver disease Father   ? Heart disease Maternal Grandmother   ? Breast cancer Maternal Grandmother 28  ? Schizophrenia Sister   ? Bipolar disorder Sister   ? Clotting disorder  Sister   ? Bipolar disorder Brother   ? Kidney disease Brother   ? Bipolar disorder Sister   ? Pancreatic cancer Maternal Aunt   ? Prostate cancer Maternal Uncle   ? Liver cancer Maternal Grandfather   ? Rectal cancer Maternal Grandfather   ? Liver cancer Paternal Grandfather   ? Colon polyps Neg Hx   ? Esophageal cancer Neg Hx   ? Stomach cancer Neg Hx   ? ? ?Social History  ? ?Socioeconomic History  ? Marital status: Married  ?  Spouse name: Not on file  ? Number of children: 1  ? Years of education: Not on file  ? Highest education level: Not on file  ?Occupational History  ? Occupation: disability  ?Tobacco Use  ? Smoking status: Former  ?  Packs/day: 0.20  ?  Years: 11.00  ?  Pack years: 2.20  ?  Types: Cigarettes  ?  Quit date: 11/17/2013  ?  Years since quitting: 7.4  ? Smokeless tobacco: Never  ? Tobacco comments:  ?  2 years quit  ?Vaping Use  ? Vaping Use: Never used  ?Substance and Sexual Activity  ? Alcohol use: No  ?  Alcohol/week: 0.0 standard drinks  ? Drug use: No  ? Sexual activity: Yes  ?  Birth control/protection: Surgical  ?Other Topics Concern  ? Not on file  ?Social History Narrative  ? Lives with sister  ? Drinks no caffeine  ?  Right Handed   ? Lives in a one story home   ? ?Social Determinants of Health  ? ?Financial Resource Strain: Not on file  ?Food Insecurity: Not on file  ?Transportation Needs: Not on file  ?Physical Activity: Not on f

## 2021-05-21 ENCOUNTER — Telehealth (HOSPITAL_BASED_OUTPATIENT_CLINIC_OR_DEPARTMENT_OTHER): Payer: Medicare Other | Admitting: Nurse Practitioner

## 2021-05-21 ENCOUNTER — Ambulatory Visit: Payer: Self-pay | Admitting: *Deleted

## 2021-05-21 ENCOUNTER — Encounter: Payer: Self-pay | Admitting: Nurse Practitioner

## 2021-05-21 DIAGNOSIS — J069 Acute upper respiratory infection, unspecified: Secondary | ICD-10-CM

## 2021-05-21 DIAGNOSIS — G43809 Other migraine, not intractable, without status migrainosus: Secondary | ICD-10-CM | POA: Diagnosis not present

## 2021-05-21 MED ORDER — SUMATRIPTAN SUCCINATE 50 MG PO TABS: 50.0000 mg | ORAL_TABLET | ORAL | 0 refills | Status: DC | PRN

## 2021-05-21 NOTE — Telephone Encounter (Deleted)
Reason for Disposition ?? [1] MODERATE headache (e.g., interferes with normal activities) AND [2] present > 24 hours AND [3] unexplained  (Exceptions: analgesics not tried, typical migraine, or headache part of viral illness) ? ?Protocols used: Headache-A-AH ? ?

## 2021-05-21 NOTE — Telephone Encounter (Addendum)
?  Chief Complaint: headache ?Symptoms: migraine for 3 days ?Frequency: constant ?Pertinent Negatives: Patient denies fever, injury ?Disposition: '[]'$ ED /'[]'$ Urgent Care (no appt availability in office) / '[x]'$ Appointment(In office/virtual)/ '[]'$  [place] [facility] [place] [facility]/ '[]'$ Home Care/ '[]'$ Refused Recommended Disposition /'[]'$ [place] [facility]/ '[]'$  Follow-up with PCP ?Additional Notes: Appt made. Pt stated she is taking Topamax but it is not working. Topamax is not on current med list. ? ?Answer Assessment - Initial Assessment Questions ?1. LOCATION: "Where does it hurt?"  ?    Stabbing pain ?2. ONSET: "When did the headache start?" (Minutes, hours or days)  ?    Started for 3 days  ?3. PATTERN: "Does the pain come and go, or has it been constant since it started?" ?    constant ?4. SEVERITY: "How bad is the pain?" and "What does it keep you from doing?"  (e.g., Scale 1-10; mild, moderate, or severe) ?  - MILD (1-3): doesn't interfere with normal activities  ?  - MODERATE (4-7): interferes with normal activities or awakens from sleep  ?  - SEVERE (8-10): excruciating pain, unable to do any normal activities    ?    10 ?5. RECURRENT SYMPTOM: "Have you ever had headaches before?" If Yes, ask: "When was the last time?" and "What happened that time?"  ?    yes ?6. CAUSE: "What do you think is causing the headache?" ?    migraine ?7. MIGRAINE: "Have you been diagnosed with migraine headaches?" If Yes, ask: "Is this headache similar?"  ?    yes ?8. HEAD INJURY: "Has there been any recent injury to the head?"  ?    no ?9. OTHER SYMPTOMS: "Do you have any other symptoms?" (fever, stiff neck, eye pain, sore throat, cold symptoms) ?    no ?10. PREGNANCY: "Is there any chance you are pregnant?" "When was your last menstrual period?" ?      No, ? ?Protocols used: Headache-A-AH ? ?

## 2021-05-21 NOTE — Progress Notes (Signed)
Virtual Visit Note ?Due to national recommendations of social distancing due to [place] 19, virtual visit is felt to be most appropriate for this patient at this time.  I discussed the limitations, risks, security and privacy concerns of performing an evaluation and management service by video and the availability of in person appointments. I also discussed with the patient that there may be a patient responsible charge related to this service. The patient expressed understanding and agreed to proceed.  ? ? ?I connected with Marisa [name] on 05/21/21  at   3:10 PM EDT  EDT by VIDEO and verified that I am speaking with the correct person using two identifiers. ? ? ?Location of Patient: ?Private Residence ?  ?Location of Provider: ?Scientist, research (physical sciences) and [employer] Office  ?  ?Persons participating in VIRTUAL visit: ?Geryl Rankins FNP-BC ?Marisa [name]  ?  ?History of Present Illness: ?VIRTUAL visit for: Migraine ? ?She has a history of migraine headaches. States topamax is ineffective. She has not tried any other  medications for her migraines that she is aware of. Headaches are occurring in the frontal and occipital regions. Associated symptoms include: nausea and photo sensitivity.  ? ?Cough ?Recently treated for pharyngitis/URI at urgent care on 05-15-2021.  Endorses persistent productive cough. Denies fever, sore throat, shortness of breath or wheezing. Home COVID test negative.  Currently using nebulizers, inhaler and tessalon as prescribed. Completed zpak.  ? ? ? ?Past Medical History:  ?Diagnosis Date  ? Abdominal pain 04/26/2013  ? Abnormal uterine bleeding (AUB) 10/11/2012  ? Acute bronchitis   ? Allergy   ? Anal pain   ? chronic  ? Anemia   ? Anxiety   ? Asthma   ? exacerbation 02-28-2014 and 02-23-2014 secondary to Rhinovirus  ? Atypical chest pain 04/26/2013  ? Benign neoplasm of sigmoid colon   ? Benign neoplasm of transverse colon   ? Carbuncle of labium 07/12/2015  ? Chest pain  02/20/2014  ? Chronic diarrhea   ? Chronic headaches   ? Chronic low back pain   ? Cigarette nicotine dependence without complication 62/95/2841  ? Cyst of right ovary   ? Dandruff 03/26/2015  ? Diabetes mellitus without complication ([place]) 32/44/0102  ? Diabetic neuropathy, painful ([place]) 12/13/2019  ? Difficult intravenous access   ? PER PT NEEDS PICC LINE  ? Dyspnea 09/07/2012  ? [name] 08/2012:  No obstruction by FEV1%, but probable restriction.    ? Falls 03/27/2014  ? Food allergy   ? Mushrooms, shellfish  ? GAD (generalized anxiety disorder) 05/04/2014  ? Gait disturbance 04/11/2014  ? Gait instability   ? GERD (gastroesophageal reflux disease)   ? History of adenomatous polyp of colon   ? History of cardiac arrest   ? during SVD 1992  ? History of ectopic pregnancy   ? 2009-  S/P LEFT SALPINGECTOMY  ? History of panic attacks   ? Hyperlipidemia   ? IBS (irritable bowel syndrome)   ? Insomnia 03/06/2014  ? Joint pain   ? Lower extremity edema   ? Lumbar stenosis L4 -- L5 with bulging disk  ? w/ right leg weakness/ decreased mobility  ? Migraine variant with headache 05/09/2014  ? Mild obstructive sleep apnea   ? study 03-20-2014  no cpap recommended  ? Neuromuscular disorder ([place])   ? neuropathy in feet   ? Neuropathic pain of both legs 06/04/2016  ? Obesity (BMI 30-39.9) 03/05/2018  ? OSA (obstructive sleep apnea) 03/06/2014  ? Panic disorder  with agoraphobia 05/04/2014  ? Panniculitis 03/05/2018  ? Pelvic pain 07/25/2013  ? Persistent vomiting 04/27/2013  ? Pneumonia   ? PTSD (post-traumatic stress disorder) 05/04/2014  ? Rash and nonspecific skin eruption 07/12/2015  ? Rectal bleeding 07/25/2013  ? RLQ abdominal pain   ? S/P Total vaginal hysterectomy on 01/06/13 01/06/2013  ? Sleep apnea   ? mild no cpap  ? Social anxiety disorder 05/04/2014  ? Sore throat 02/20/2014  ? Stomach ulcer   ? Tachycardia 02/20/2014  ? Type 2 diabetes mellitus ([place])   ? Weakness of right leg   ? FROM BACK PROBLEM PER PT  ?  ?Past  Surgical History:  ?Procedure Laterality Date  ? ABDOMINAL HYSTERECTOMY    ? partial  ? COLONOSCOPY Left 04/29/2013  ? Procedure: COLONOSCOPY;  Surgeon: Arta Silence, MD;  [facility];  Service: Endoscopy;  Laterality: Left;  ? COLONOSCOPY    ? COLONOSCOPY WITH PROPOFOL N/A 08/01/2016  ? Procedure: COLONOSCOPY WITH PROPOFOL;  Surgeon: Doran Stabler, MD;  [facility];  Service: Gastroenterology;  Laterality: N/A;  ? ECTOPIC PREGNANCY SURGERY    ? ESOPHAGOGASTRODUODENOSCOPY (EGD) WITH PROPOFOL N/A 11/10/2018  ? Procedure: ESOPHAGOGASTRODUODENOSCOPY (EGD) WITH PROPOFOL;  Surgeon: Doran Stabler, MD;  [facility];  Service: Gastroenterology;  Laterality: N/A;  ? EVALUATION UNDER ANESTHESIA WITH FISTULECTOMY N/A 04/20/2014  ? Procedure: EXAM UNDER ANESTHESIA ;  Surgeon: Leighton Ruff, MD;  Location[facility];  Service: General;  Laterality: N/A;  ? FLEXIBLE SIGMOIDOSCOPY N/A 11/09/2013  ? Procedure: FLEXIBLE SIGMOIDOSCOPY;  Surgeon: Arta Silence, MD;  [facility];  Service: Endoscopy;  Laterality: N/A;  ? fupa removal     ? LAPAROSCOPIC CHOLECYSTECTOMY  2005  ? LAPAROSCOPIC GASTRIC SLEEVE RESECTION N/A 12/24/2020  ? Procedure: LAPAROSCOPIC GASTRIC SLEEVE RESECTION;  Surgeon: [name], MD;  [facility];  Service: General;  Laterality: N/A;  ? REFRACTIVE SURGERY    ? SPHINCTEROTOMY N/A 04/20/2014  ? Procedure:  LATERAL INTERNAL SPHINCTEROTOMY;  Surgeon: Leighton Ruff, MD;  Location: [facility];  Service: General;  Laterality: N/A;  ? TRANSTHORACIC ECHOCARDIOGRAM  12/30/2012  ? mild LVH/  ef 55-60%  ? UNILATERAL SALPINGECTOMY  2009  ? laparotomy left salpingectomy-- ectopic preg.  ? UPPER GASTROINTESTINAL ENDOSCOPY    ? UPPER GI ENDOSCOPY N/A 12/24/2020  ? Procedure: UPPER GI ENDOSCOPY;  Surgeon: [name], MD;  [facility];  Service: General;  Laterality: N/A;  ? VAGINAL HYSTERECTOMY N/A 01/06/2013  ?  Procedure: HYSTERECTOMY VAGINAL;  Surgeon: Osborne Oman, MD;  Location: [facility];  Service: Gynecology;  Laterality: N/A;  ?  ?Family History  ?Problem Relation Age of Onset  ? Hypertension Mother   ? Diabetes Mother   ? Allergies Mother   ? Heart disease Mother   ? Clotting disorder Mother   ? Stroke Mother   ? Kidney disease Mother   ? Thyroid disease Mother   ? Cancer Father   ? Hyperlipidemia Father   ? Hypertension Father   ? Colon cancer Father   ? Liver disease Father   ? Heart disease Maternal Grandmother   ? Breast cancer Maternal Grandmother 35  ? Schizophrenia Sister   ? Bipolar disorder Sister   ? Clotting disorder Sister   ? Bipolar disorder Brother   ? Kidney disease Brother   ? Bipolar disorder Sister   ? Pancreatic cancer Maternal Aunt   ? Prostate cancer Maternal Uncle   ?  Liver cancer Maternal Grandfather   ? Rectal cancer Maternal Grandfather   ? Liver cancer Paternal Grandfather   ? Colon polyps Neg Hx   ? Esophageal cancer Neg Hx   ? Stomach cancer Neg Hx   ?  ?Social History  ? ?Socioeconomic History  ? Marital status: Married  ?  Spouse name: Not on file  ? Number of children: 1  ? Years of education: Not on file  ? Highest education level: Not on file  ?Occupational History  ? Occupation: disability  ?Tobacco Use  ? Smoking status: Former  ?  Packs/day: 0.20  ?  Years: 11.00  ?  Pack years: 2.20  ?  Types: Cigarettes  ?  Quit date: 11/17/2013  ?  Years since quitting: 7.5  ? Smokeless tobacco: Never  ? Tobacco comments:  ?  2 years quit  ?Vaping Use  ? Vaping Use: Never used  ?Substance and Sexual Activity  ? Alcohol use: No  ?  Alcohol/week: 0.0 standard drinks  ? Drug use: No  ? Sexual activity: Yes  ?  Birth control/protection: Surgical  ?Other Topics Concern  ? Not on file  ?Social History Narrative  ? Lives with sister  ? Drinks no caffeine  ? Right Handed   ? Lives in a one story home   ? ?Social Determinants of Health  ? ?Financial Resource Strain: Not on file  ?Food Insecurity: Not  on file  ?Transportation Needs: Not on file  ?Physical Activity: Not on file  ?Stress: Not on file  ?Social Connections: Not on file  ?  ? ?Observations/Objective: ?Awake, alert and oriented x 3 ? ? ?Review of System

## 2021-05-26 IMAGING — CT CT ABD-PELV W/ CM
2 of 5 series · 16 of 46 positions shown, 18 images · IV contrast (Omni 300)
Comparison: None.

CLINICAL DATA: Acute nonlocalized abdominal pain. Dark red blood in
stools X 4 days. Status post fall on the tub today. Positive loss of
consciousness.

EXAM:
CT ABDOMEN AND PELVIS WITH CONTRAST
TECHNIQUE: Multidetector CT imaging of the abdomen and pelvis was performed
using the standard protocol following bolus administration of
intravenous contrast.
CONTRAST:  100mL OMNIPAQUE IOHEXOL 300 MG/ML  SOLN

[Series 3: a/p w/ 5mm · axial · 0.98mm/px · z∈[-910,-450]mm · 13 of 104 slices shown, 15 images]
[im 6/104  soft-tissue]
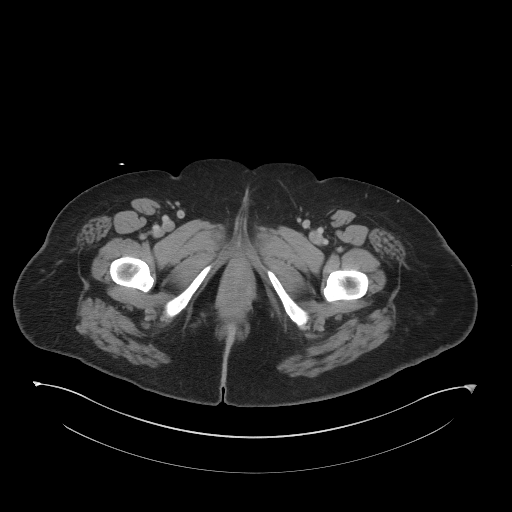
[im 6/104  bone]
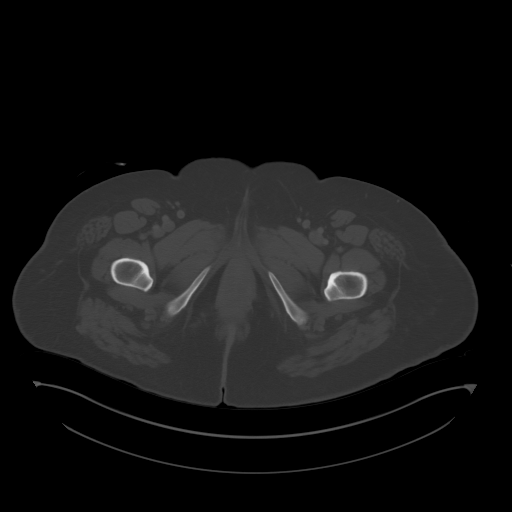
[im 17/104  soft-tissue]
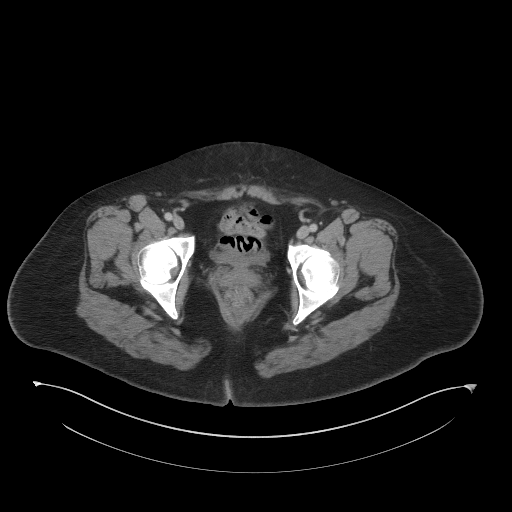
[im 22/104  soft-tissue]
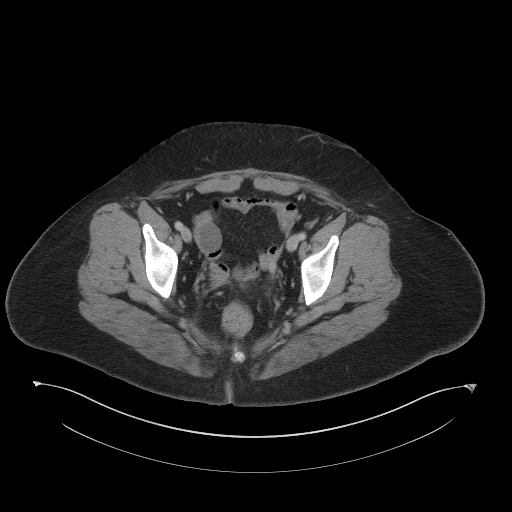
[im 28/104  soft-tissue]
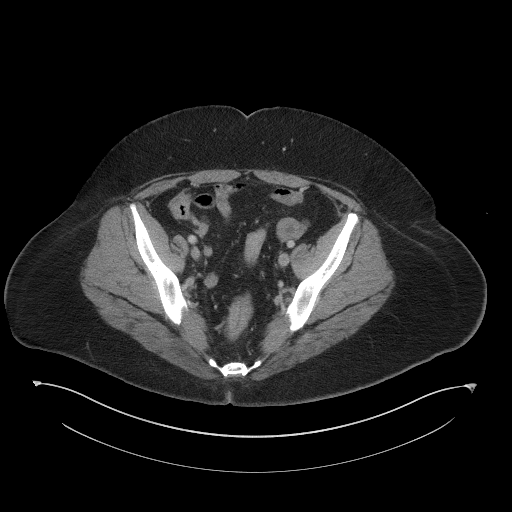
[im 38/104  soft-tissue]
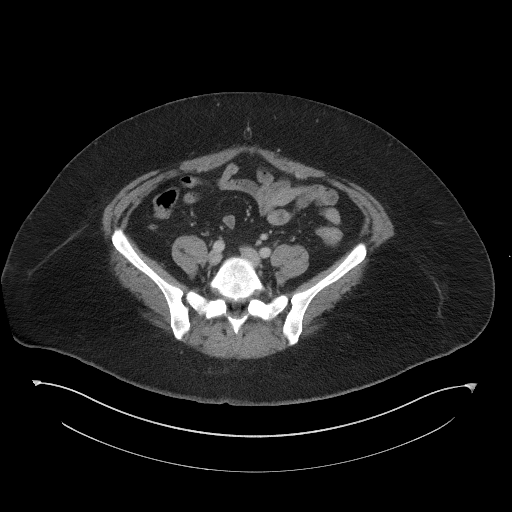
[im 44/104  soft-tissue]
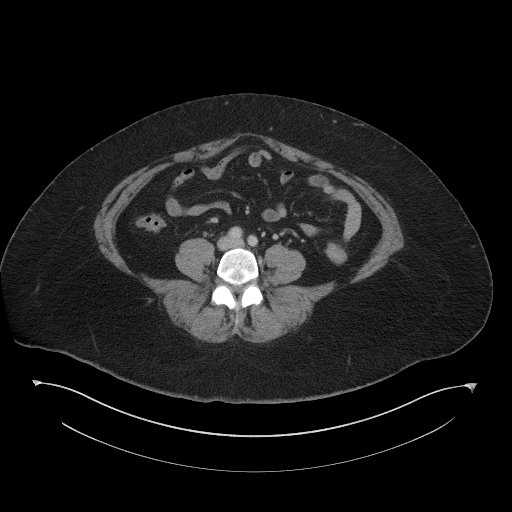
[im 55/104  soft-tissue]
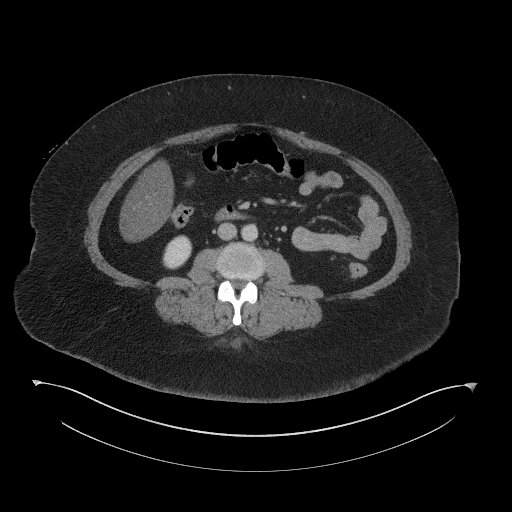
[im 60/104  soft-tissue]
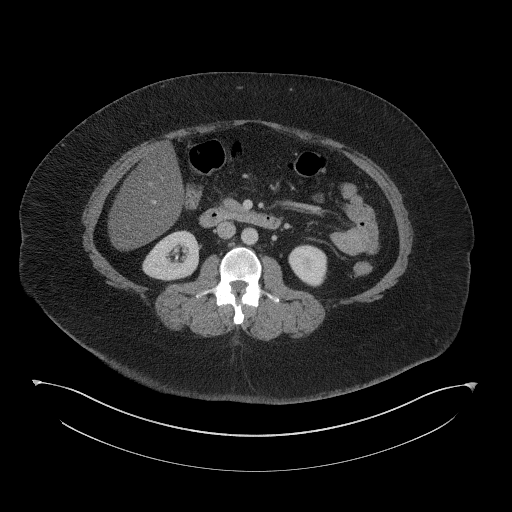
[im 66/104  soft-tissue]
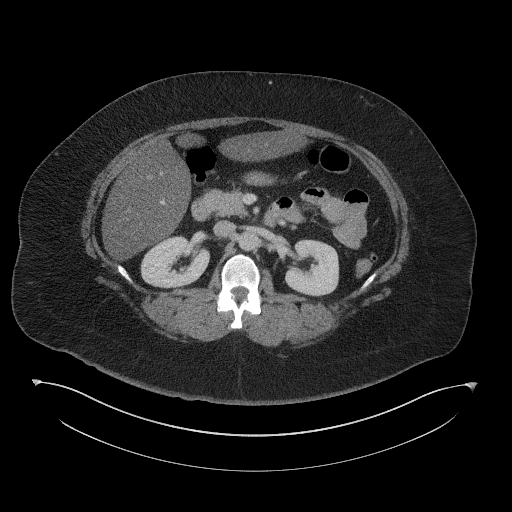
[im 66/104  bone]
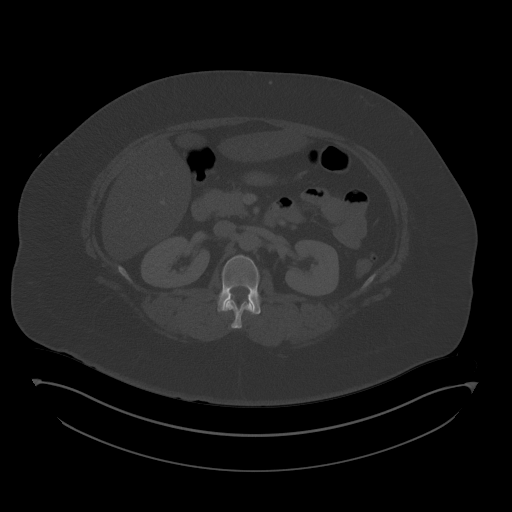
[im 76/104  soft-tissue]
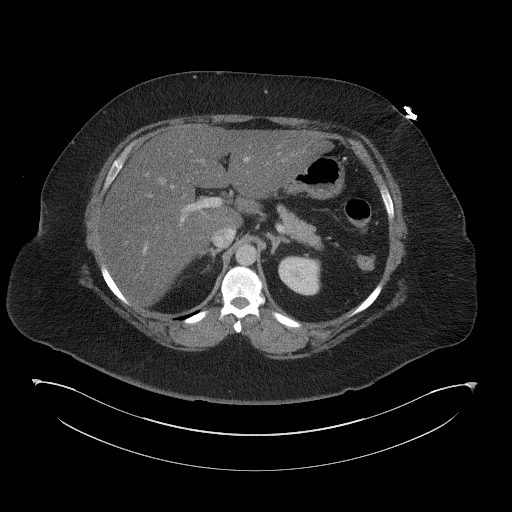
[im 82/104  soft-tissue]
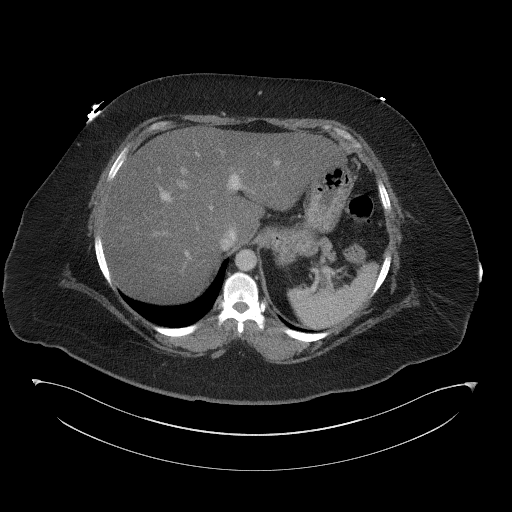
[im 87/104  soft-tissue]
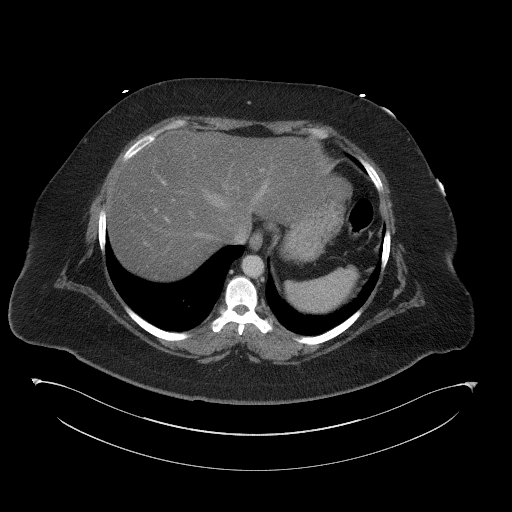
[im 98/104  soft-tissue]
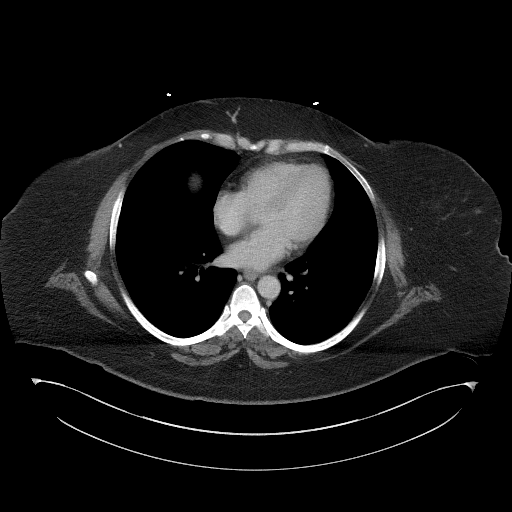

[Series 6: a/p w/ cor · coronal · 0.97mm/px · 3 of 177 slices shown]
[im 59/177  soft-tissue]
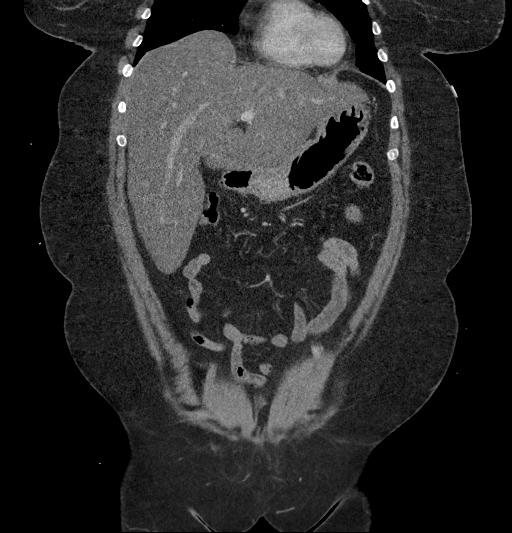
[im 79/177  soft-tissue]
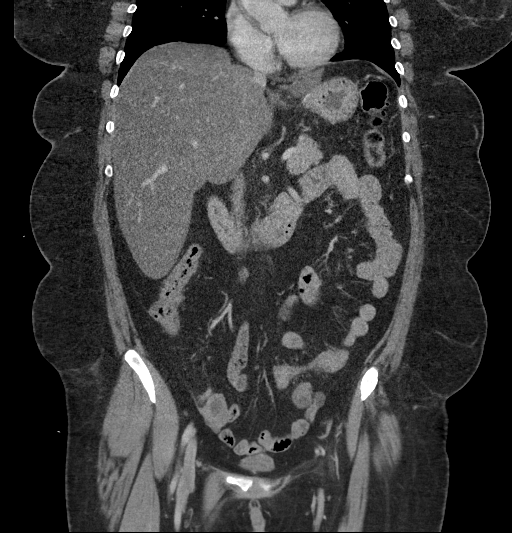
[im 98/177  soft-tissue]
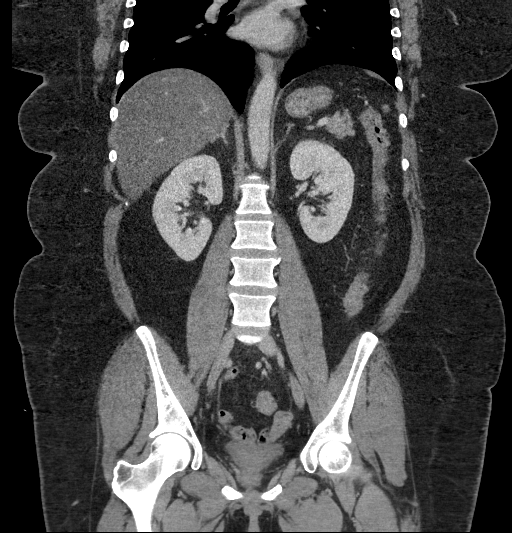

[16 of 46 positions shown; findings below may reference images not displayed]

FINDINGS: Lower chest: No acute abnormality.

Hepatobiliary: The liver is enlarged measuring up to at least
cm. The hepatic parenchyma is diffusely hypodense compared to the
splenic parenchyma consistent with fatty infiltration. No focal
liver abnormality. Status post cholecystectomy. No biliary
dilatation.

Pancreas: No focal lesion. Normal pancreatic contour. No surrounding
inflammatory changes. No main pancreatic ductal dilatation.

Spleen: Normal in size without focal abnormality.

Adrenals/Urinary Tract: No adrenal nodule bilaterally. Bilateral
kidneys enhance symmetrically. No hydronephrosis. No hydroureter.
The urinary bladder is decompressed. On delayed imaging, there is no
urothelial wall thickening and there are no filling defects in the
opacified portions of the bilateral collecting systems or ureters.

Stomach/Bowel: Stomach is within normal limits. No evidence of bowel
wall thickening or dilatation. Scattered colonic diverticulosis.
Appendix appears normal.

Vascular/Lymphatic: No abdominal aorta or iliac aneurysm. Mild
atherosclerotic plaque of the aorta and its branches. No abdominal,
pelvic, or inguinal lymphadenopathy.

Reproductive: Status post hysterectomy. No adnexal masses.

Other: No intraperitoneal free fluid. No intraperitoneal free gas.
No organized fluid collection.

Musculoskeletal:

No abdominal wall hernia or abnormality

No suspicious lytic or blastic osseous lesions. No acute displaced
fracture.
IMPRESSION: 1. Scattered colonic diverticulosis with no findings suggest acute
diverticulitis.
2. Hepatomegaly and hepatic steatosis.

## 2021-05-26 IMAGING — CT CT HEAD W/O CM
4 series · 17 of 47 positions shown, 19 images · non-contrast
Comparison: Brain MR January 02, 2018 and head CT January 02, 2018.

CLINICAL DATA: Fall in tub today hitting back of head with loss of
consciousness.

EXAM:
CT HEAD WITHOUT CONTRAST
TECHNIQUE: Contiguous axial images were obtained from the base of the skull
through the vertex without intravenous contrast.

[Series 3: head without · axial · non-contrast · 0.44mm/px · z∈[-62,+63]mm · 7 of 35 slices shown, 9 images]
[im 5/35  brain]
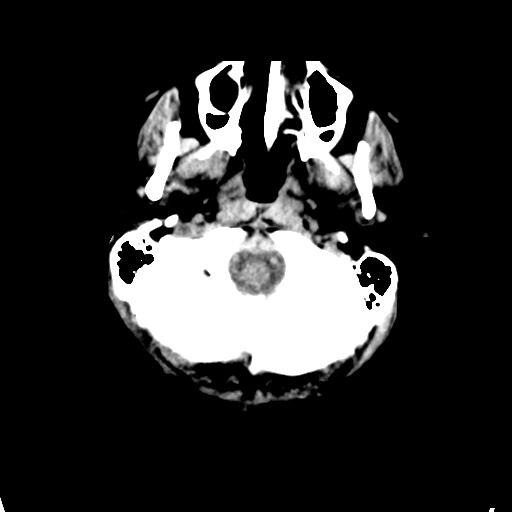
[im 5/35  bone]
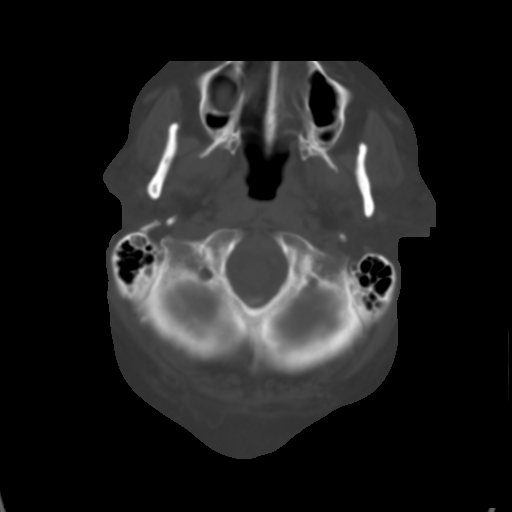
[im 9/35  brain]
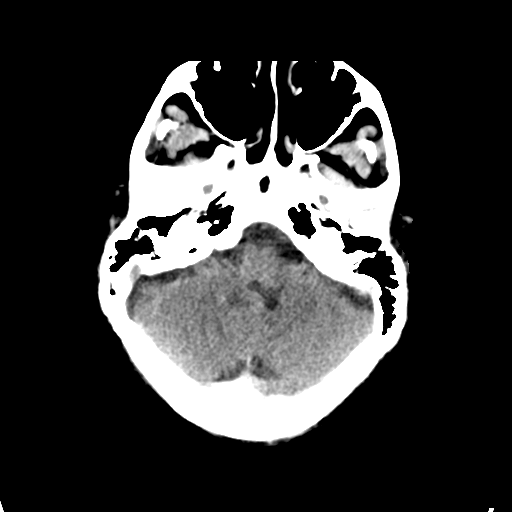
[im 13/35  brain]
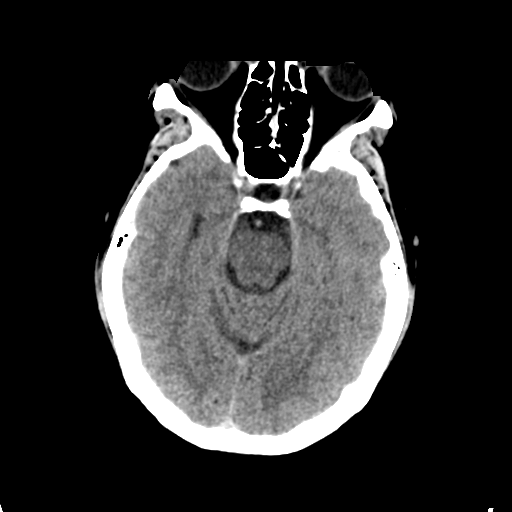
[im 18/35  brain]
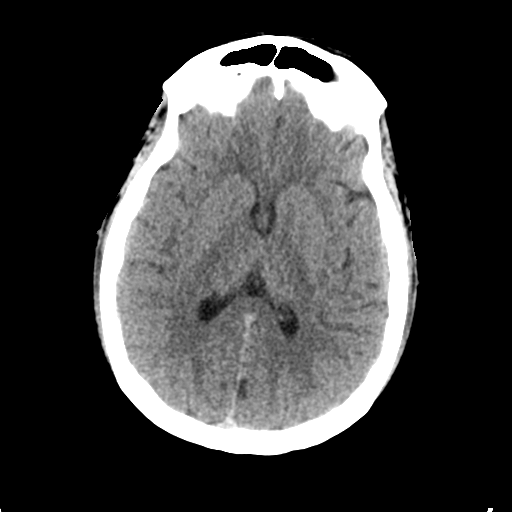
[im 22/35  brain]
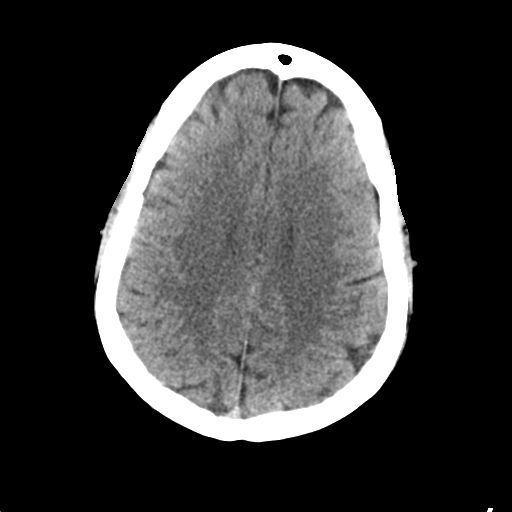
[im 22/35  bone]
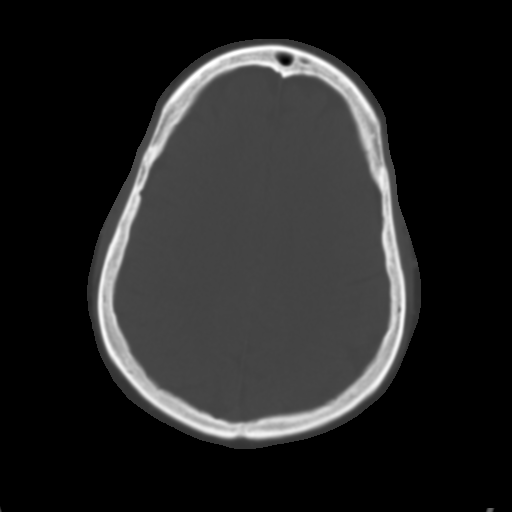
[im 26/35  brain]
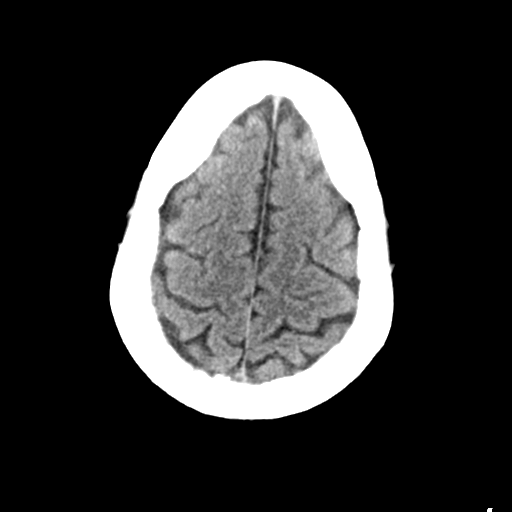
[im 30/35  brain]
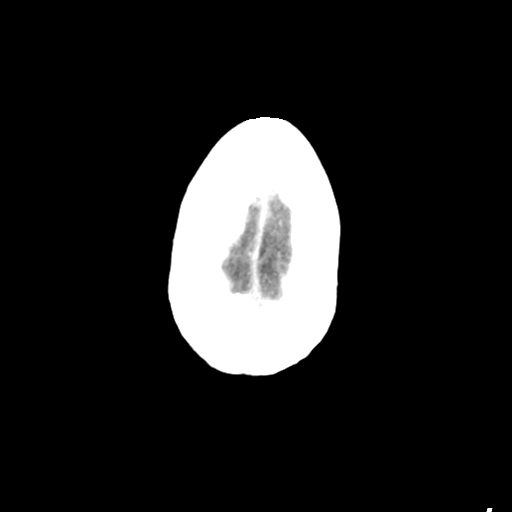

[Series 4: head bone · axial · 0.44mm/px · z∈[-66,-6]mm · 4 of 87 slices shown]
[im 9/87  bone]
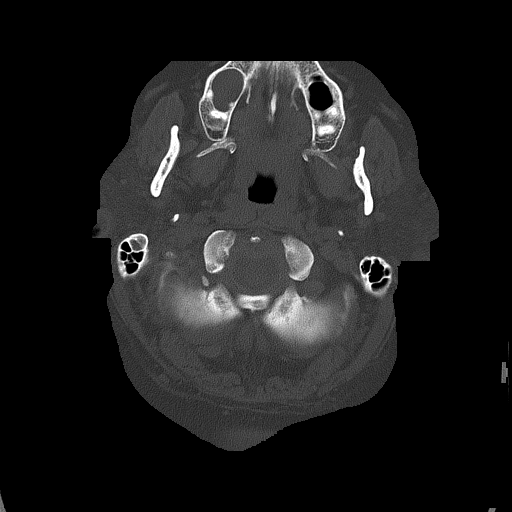
[im 18/87  bone]
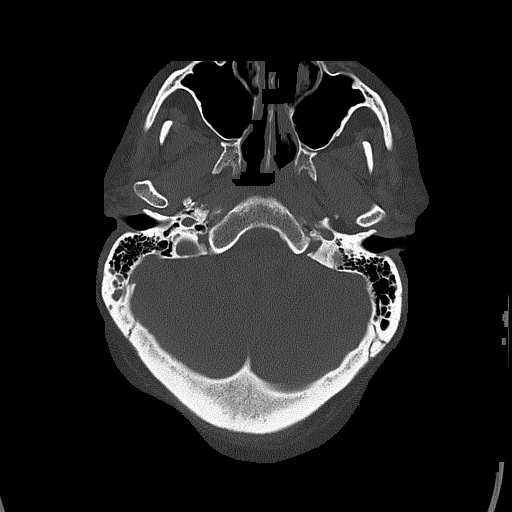
[im 26/87  bone]
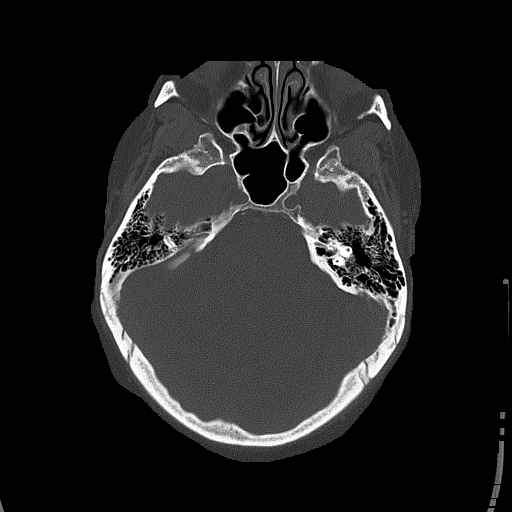
[im 39/87  bone]
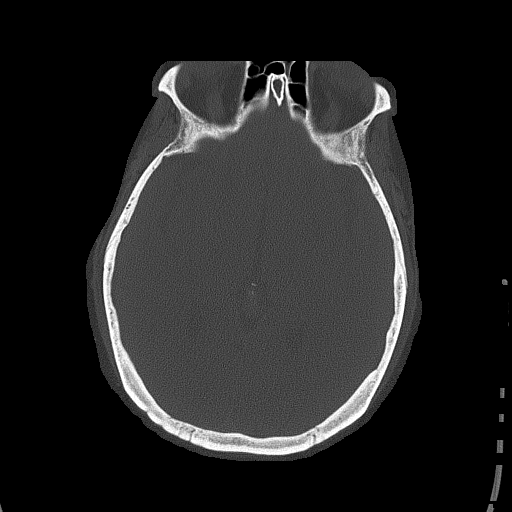

[Series 5: head without cor · coronal · non-contrast · 0.33mm/px · 3 of 73 slices shown]
[im 25/73  brain]
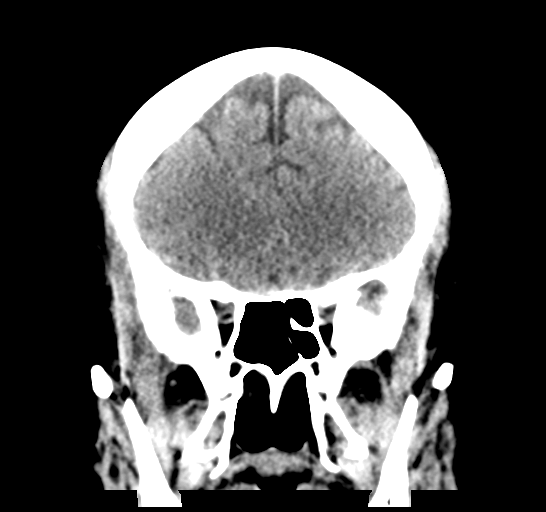
[im 33/73  brain]
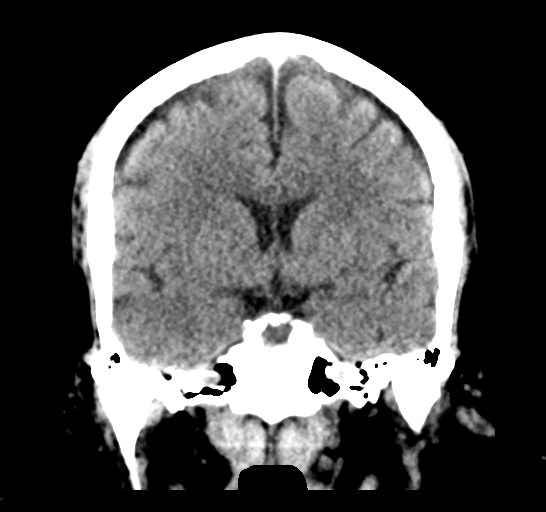
[im 41/73  brain]
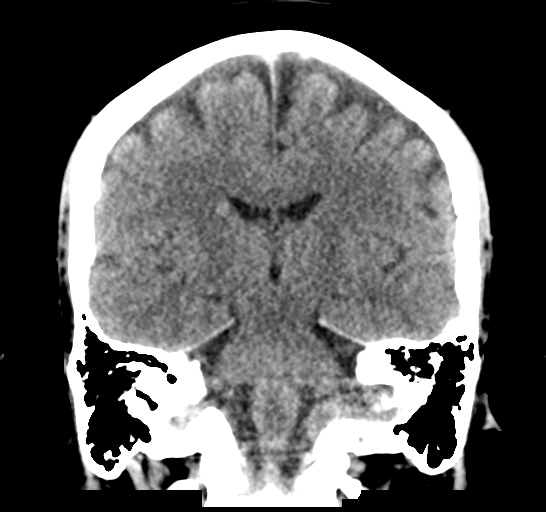

[Series 6: head without sag · sagittal · non-contrast · 0.33mm/px · 3 of 59 slices shown]
[im 20/59  brain]
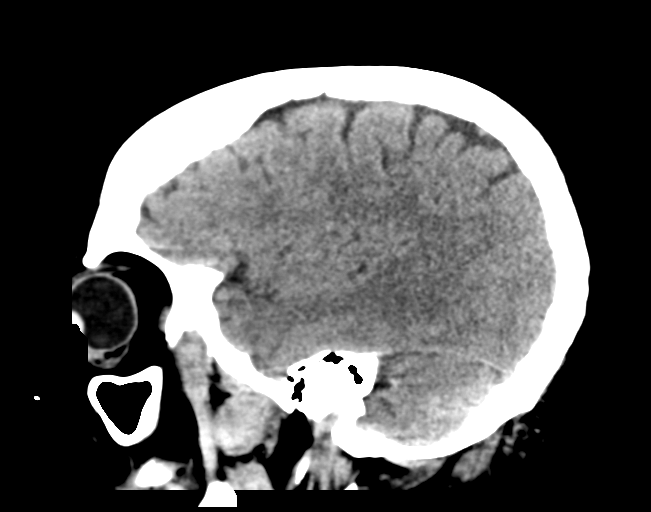
[im 30/59  brain]
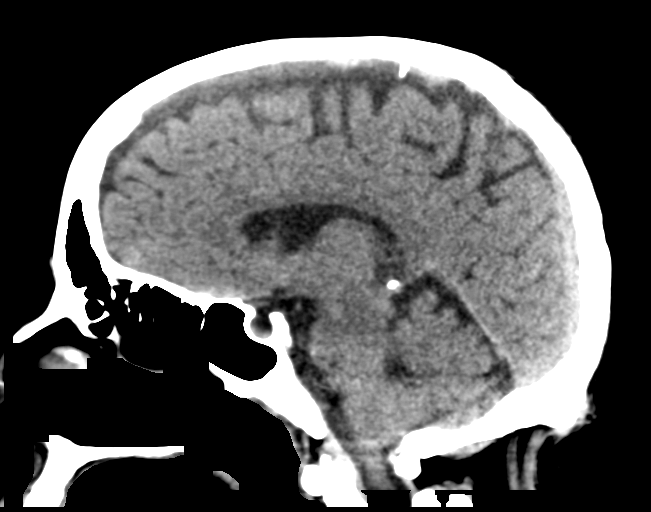
[im 39/59  brain]
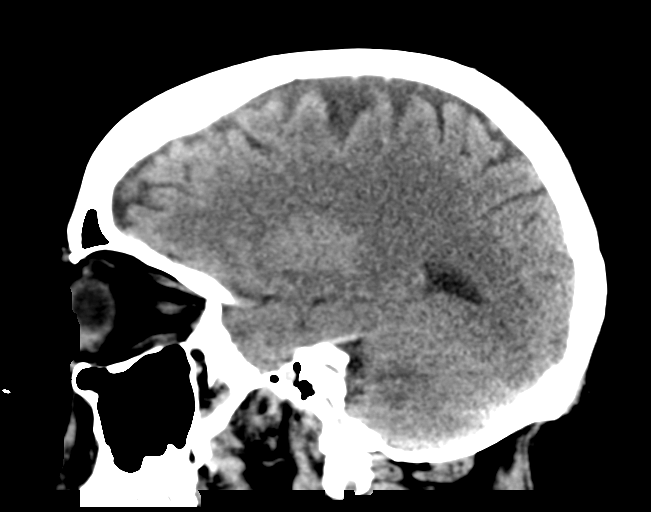

[17 of 47 positions shown; findings below may reference images not displayed]

FINDINGS: Brain: No evidence of acute infarction, hemorrhage, hydrocephalus,
extra-axial collection or mass lesion/mass effect.

Vascular: No hyperdense vessel or unexpected calcification.

Skull: Normal. Negative for fracture or focal lesion.

Sinuses/Orbits: Right maxillary mucous retention cyst. The other
paranasal sinuses and mastoid air cells are clear.

Other: None.
IMPRESSION: No acute intracranial findings.

## 2021-05-27 ENCOUNTER — Institutional Professional Consult (permissible substitution): Payer: Medicare Other | Admitting: Clinical

## 2021-05-27 ENCOUNTER — Ambulatory Visit: Payer: Medicare Other | Admitting: Clinical

## 2021-05-28 ENCOUNTER — Encounter: Payer: Medicare Other | Attending: Surgery | Admitting: Skilled Nursing Facility1

## 2021-05-28 DIAGNOSIS — Z713 Dietary counseling and surveillance: Secondary | ICD-10-CM | POA: Diagnosis not present

## 2021-05-28 DIAGNOSIS — Z6833 Body mass index (BMI) 33.0-33.9, adult: Secondary | ICD-10-CM | POA: Diagnosis not present

## 2021-05-28 DIAGNOSIS — E1140 Type 2 diabetes mellitus with diabetic neuropathy, unspecified: Secondary | ICD-10-CM | POA: Diagnosis present

## 2021-05-28 DIAGNOSIS — E669 Obesity, unspecified: Secondary | ICD-10-CM

## 2021-05-29 NOTE — Progress Notes (Signed)
Follow-up visit:  Post-Operative 05/28/2021 Surgery ? ?Medical Nutrition Therapy:  Appt start time: 6:00pm end time:  7:00pm ? ?Primary concerns today: Post-operative Bariatric Surgery Nutrition Management 6 Month Post-Op Class ? ?Surgery date: 12/24/2020 ?Surgery type: sleeve ?Start weight at [facility]: 242.6 ?Weight today: 205.3 pounds ?  ?Body Composition Scale 240.6 02/19/2021 05/28/2021  ?Current Body Weight 43.7 226.6 205.3  ?Total Body Fat % 14 42.1 39.3  ?Visceral Fat 56.'2 13 11  '$ ?Fat-Free Mass % 42.6 57.8 60.6  ? Total Body Water % 32.4 43.4 44.8  ?Muscle-Mass lbs 39.7 32.3 32.1  ?BMI  37.3 33.8  ?Body Fat Displacement     ?       Torso  lbs 65.2 59.1 50.0  ?       Left Leg  lbs 13 11.8 10  ?       Right Leg  lbs 13 11.8 10  ?       Left Arm  lbs 6.5 5.9 5  ?       Right Arm   lbs 6.5 5.9 5  ? ? ? ?Information Reviewed/ Discussed During Appointment: ?-Review of composition scale numbers ?-Fluid requirements (64-100 ounces) ?-Protein requirements (60-80g) ?-Strategies for tolerating diet ?-Advancement of diet to include Starchy vegetables ?-Barriers to inclusion of new foods ?-Inclusion of appropriate multivitamin and calcium supplements  ?-Exercise recommendations  ? ?Fluid intake: adequate  ? ?Medications: See List ?Supplementation: appropriate  ? ?CBG monitoring: 2 times daily ?Average CBG per patient: 88-99 ?Last patient reported A1c: 5.4 ? ?Using straws: no ?Drinking while eating: no ?Having you been chewing well: yes ?Chewing/swallowing difficulties: no ?Changes in vision: no ?Changes to mood/headaches: no ?Hair loss/Cahnges to skin/Changes to nails: no ?Any difficulty focusing or concentrating: no ?Sweating: no ?Dizziness/Lightheaded: no ?Palpitations: no  ?Carbonated beverages: no ?N/V/D/C/GAS: no ?Abdominal Pain: no ?Dumping syndrome: no ? ?Recent physical activity:  ADL's ? ?Progress Towards Goal(s):  In Progress ?Teaching method utilized: Visual & Auditory  ?Demonstrated degree of understanding via:  Teach Back  ?Readiness Level: Action ?Barriers to learning/adherence to lifestyle change: none identified ? ?Handouts given during visit include: ?Phase V diet Progression  ?Goals Sheet ?The Benefits of Exercise are endless..... ?Support Group Topics ? ?Teaching Method Utilized:  ?Visual ?Auditory ?Hands on ? ?Demonstrated degree of understanding via:  Teach Back  ? ?Monitoring/Evaluation:  Dietary intake, exercise, and body weight. Follow up in 3 months for 9 month post-op visit. ? ?

## 2021-05-30 ENCOUNTER — Ambulatory Visit: Payer: Medicare Other

## 2021-05-30 NOTE — Therapy (Deleted)
?OUTPATIENT PHYSICAL THERAPY CERVICAL EVALUATION ? ? ?Patient Name: Marisa [name] ?MRN: [id number] ?DOB:[birth year], 46 y.o., female ?Today's Date: 05/30/2021 ? ? ? ?Past Medical History:  ?Diagnosis Date  ? Abdominal pain 04/26/2013  ? Abnormal uterine bleeding (AUB) 10/11/2012  ? Acute bronchitis   ? Allergy   ? Anal pain   ? chronic  ? Anemia   ? Anxiety   ? Asthma   ? exacerbation 02-28-2014 and 02-23-2014 secondary to Rhinovirus  ? Atypical chest pain 04/26/2013  ? Benign neoplasm of sigmoid colon   ? Benign neoplasm of transverse colon   ? Carbuncle of labium 07/12/2015  ? Chest pain 02/20/2014  ? Chronic diarrhea   ? Chronic headaches   ? Chronic low back pain   ? Cigarette nicotine dependence without complication 83/41/9622  ? Cyst of right ovary   ? Dandruff 03/26/2015  ? Diabetes mellitus without complication ([place]) 29/79/8921  ? Diabetic neuropathy, painful ([place]) 12/13/2019  ? Difficult intravenous access   ? PER PT NEEDS PICC LINE  ? Dyspnea 09/07/2012  ? [name] 08/2012:  No obstruction by FEV1%, but probable restriction.    ? Falls 03/27/2014  ? Food allergy   ? Mushrooms, shellfish  ? GAD (generalized anxiety disorder) 05/04/2014  ? Gait disturbance 04/11/2014  ? Gait instability   ? GERD (gastroesophageal reflux disease)   ? History of adenomatous polyp of colon   ? History of cardiac arrest   ? during SVD 1992  ? History of ectopic pregnancy   ? 2009-  S/P LEFT SALPINGECTOMY  ? History of panic attacks   ? Hyperlipidemia   ? IBS (irritable bowel syndrome)   ? Insomnia 03/06/2014  ? Joint pain   ? Lower extremity edema   ? Lumbar stenosis L4 -- L5 with bulging disk  ? w/ right leg weakness/ decreased mobility  ? Migraine variant with headache 05/09/2014  ? Mild obstructive sleep apnea   ? study 03-20-2014  no cpap recommended  ? Neuromuscular disorder ([place])   ? neuropathy in feet   ? Neuropathic pain of both legs 06/04/2016  ? Obesity (BMI 30-39.9) 03/05/2018  ? OSA (obstructive sleep apnea)  03/06/2014  ? 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  ? Panniculitis 03/05/2018  ? Pelvic pain 07/25/2013  ? Persistent vomiting 04/27/2013  ? Pneumonia   ? PTSD (post-traumatic stress disorder) 05/04/2014  ? Rash and nonspecific skin eruption 07/12/2015  ? Rectal bleeding 07/25/2013  ? RLQ abdominal pain   ? S/P Total vaginal hysterectomy on 01/06/13 01/06/2013  ? Sleep apnea   ? mild no cpap  ? Social anxiety disorder 05/04/2014  ? Sore throat 02/20/2014  ? Stomach ulcer   ? Tachycardia 02/20/2014  ? Type 2 diabetes mellitus ([place])   ? Weakness of right leg   ? FROM BACK PROBLEM PER PT  ? ?Past Surgical History:  ?Procedure Laterality Date  ? ABDOMINAL HYSTERECTOMY    ? partial  ? COLONOSCOPY Left 04/29/2013  ? Procedure: COLONOSCOPY;  Surgeon: Arta Silence, MD;  [facility];  Service: Endoscopy;  Laterality: Left;  ? COLONOSCOPY    ? COLONOSCOPY WITH PROPOFOL N/A 08/01/2016  ? Procedure: COLONOSCOPY WITH PROPOFOL;  Surgeon: Doran Stabler, MD;  [facility];  Service: Gastroenterology;  Laterality: N/A;  ? ECTOPIC PREGNANCY SURGERY    ? ESOPHAGOGASTRODUODENOSCOPY (EGD) WITH PROPOFOL N/A 11/10/2018  ? Procedure: ESOPHAGOGASTRODUODENOSCOPY (EGD) WITH PROPOFOL;  Surgeon: Doran Stabler, MD;  [facility];  Service: Gastroenterology;  Laterality: N/A;  ? EVALUATION UNDER ANESTHESIA WITH FISTULECTOMY N/A 04/20/2014  ? Procedure: EXAM UNDER ANESTHESIA ;  Surgeon: Leighton Ruff, MD;  Location[facility];  Service: General;  Laterality: N/A;  ? FLEXIBLE SIGMOIDOSCOPY N/A 11/09/2013  ? Procedure: FLEXIBLE SIGMOIDOSCOPY;  Surgeon: Arta Silence, MD;  [facility];  Service: Endoscopy;  Laterality: N/A;  ? fupa removal     ? LAPAROSCOPIC CHOLECYSTECTOMY  2005  ? LAPAROSCOPIC GASTRIC SLEEVE RESECTION N/A 12/24/2020  ? Procedure: LAPAROSCOPIC GASTRIC SLEEVE RESECTION;  Surgeon: [name], MD;  [facility];  Service: General;  Laterality: N/A;   ? REFRACTIVE SURGERY    ? SPHINCTEROTOMY N/A 04/20/2014  ? Procedure:  LATERAL INTERNAL SPHINCTEROTOMY;  Surgeon: Leighton Ruff, MD;  Location: [facility];  Service: General;  Laterality: N/A;  ? TRANSTHORACIC ECHOCARDIOGRAM  12/30/2012  ? mild LVH/  ef 55-60%  ? UNILATERAL SALPINGECTOMY  2009  ? laparotomy left salpingectomy-- ectopic preg.  ? UPPER GASTROINTESTINAL ENDOSCOPY    ? UPPER GI ENDOSCOPY N/A 12/24/2020  ? Procedure: UPPER GI ENDOSCOPY;  Surgeon: [name], MD;  [facility];  Service: General;  Laterality: N/A;  ? VAGINAL HYSTERECTOMY N/A 01/06/2013  ? Procedure: HYSTERECTOMY VAGINAL;  Surgeon: Osborne Oman, MD;  Location: [facility];  Service: Gynecology;  Laterality: N/A;  ? ?Patient Active Problem List  ? Diagnosis Date Noted  ? Multinodular thyroid 03/01/2021  ? Solitary pulmonary nodule 02/21/2021  ? Frequent falls 02/21/2021  ? Weakness of both lower extremities 02/21/2021  ? Carpal tunnel syndrome of right wrist 02/21/2021  ? Acute constipation 02/21/2021  ? 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  ? Morbid obesity ([place]) 12/24/2020  ? Allergic rhinitis 06/26/2020  ? Hyperlipidemia associated with type 2 diabetes mellitus ([place]) 04/10/2020  ? Chronic pain syndrome 04/09/2020  ? Type 2 diabetes mellitus with diabetic neuropathy, without long-term current use of insulin ([place]) 04/09/2020  ? 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 12/13/2019  ? Vitamin D deficiency 04/05/2019  ? Class 2 severe obesity with serious comorbidity and body mass index (BMI) of 36.0 to 36.9 in adult [facility]) 04/05/2019  ? Obesity (BMI 30-39.9) 03/05/2018  ? Chronic diarrhea   ? Benign neoplasm of transverse colon   ? Benign neoplasm of sigmoid colon   ? Neuropathic pain of both legs 06/04/2016  ? Rash and nonspecific skin eruption 07/12/2015  ? Dandruff 03/26/2015  ? Migraine variant with headache 05/09/2014  ? GAD (generalized anxiety disorder) 05/04/2014  ? Panic disorder  with agoraphobia 05/04/2014  ? Social anxiety disorder 05/04/2014  ? PTSD (post-traumatic stress disorder) 05/04/2014  ? Depression 03/27/2014  ? OSA (obstructive sleep apnea) 03/06/2014  ? Insomnia 03/06/2014  ? History of cardiac arrest   ? S/P Total vaginal hysterectomy on 01/06/13 01/06/2013  ? 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 ? ?PCP: Elsie Stain, MD ? ?REFERRING PROVIDER: Alda Berthold, DO ? ?REFERRING DIAG:  ?M54.12 (ICD-10-CM) - Cervical radiculopathy ? ?THERAPY DIAG:  ?No diagnosis found. ? ?ONSET DATE: *** ? ?SUBJECTIVE:                                                                                                                                                                                                        ? ?  SUBJECTIVE STATEMENT: ?*** ? ?PERTINENT HISTORY:  ?*** ? ?PAIN:  ?Are you having pain? {OPRCPAIN:27236} ? ?PRECAUTIONS: {Therapy precautions:24002} ? ?WEIGHT BEARING RESTRICTIONS {Yes ***/No:24003} ? ?FALLS:  ?Has patient fallen in last 6 months? {fallsyesno:27318} ? ?LIVING ENVIRONMENT: ?Lives with: {[facility] lives with:25569::"lives with their family"} ?Lives in: {Lives in:25570} ?Stairs: {opstairs:27293} ?Has following equipment at home: {Assistive devices:23999} ? ?OCCUPATION: *** ? ?PLOF: {PLOF:24004} ? ?PATIENT GOALS *** ? ?OBJECTIVE:  ? ?DIAGNOSTIC FINDINGS:  ?*** ? ?PATIENT SURVEYS:  ?{rehab surveys:24030} ? ? ?COGNITION: ?Overall cognitive status: {cognition:24006} ? ? ?SENSATION: ?{sensation:27233} ? ?POSTURE:  ?*** ? ?PALPATION: ?***  ? ?CERVICAL ROM:  ? ?{AROM/PROM:27142} ROM A/PROM (deg) ?05/30/2021  ?Flexion   ?Extension   ?Right lateral flexion   ?Left lateral flexion   ?Right rotation   ?Left rotation   ? (Blank rows = not tested) ? ?UE ROM: ? ?{AROM/PROM:27142} ROM Right ?05/30/2021 Left ?05/30/2021  ?Shoulder flexion    ?Shoulder extension    ?Shoulder abduction    ?Shoulder adduction    ?Shoulder extension    ?Shoulder internal rotation    ?Shoulder external rotation     ?Elbow flexion    ?Elbow extension    ?Wrist flexion    ?Wrist extension    ?Wrist ulnar deviation    ?Wrist radial deviation    ?Wrist pronation    ?Wrist supination    ? (Blank rows = not tested) ? ?UE MMT: ?

## 2021-06-06 ENCOUNTER — Ambulatory Visit: Payer: Medicare Other | Attending: Neurology

## 2021-06-06 NOTE — Therapy (Incomplete)
?OUTPATIENT PHYSICAL THERAPY CERVICAL EVALUATION ? ? ?Patient Name: Marisa [name] ?MRN: [id number] ?DOB:May 28, 1975, 46 y.o., female ?Today's Date: 06/06/2021 ? ? ? ?Past Medical History:  ?Diagnosis Date  ? Abdominal pain 04/26/2013  ? Abnormal uterine bleeding (AUB) 10/11/2012  ? Acute bronchitis   ? Allergy   ? Anal pain   ? chronic  ? Anemia   ? Anxiety   ? Asthma   ? exacerbation 02-28-2014 and 02-23-2014 secondary to Rhinovirus  ? Atypical chest pain 04/26/2013  ? Benign neoplasm of sigmoid colon   ? Benign neoplasm of transverse colon   ? Carbuncle of labium 07/12/2015  ? Chest pain 02/20/2014  ? Chronic diarrhea   ? Chronic headaches   ? Chronic low back pain   ? Cigarette nicotine dependence without complication 68/01/7516  ? Cyst of right ovary   ? Dandruff 03/26/2015  ? Diabetes mellitus without complication ([place]) 00/17/4944  ? Diabetic neuropathy, painful ([place]) 12/13/2019  ? Difficult intravenous access   ? PER PT NEEDS PICC LINE  ? Dyspnea 09/07/2012  ? [name] 08/2012:  No obstruction by FEV1%, but probable restriction.    ? Falls 03/27/2014  ? Food allergy   ? Mushrooms, shellfish  ? GAD (generalized anxiety disorder) 05/04/2014  ? Gait disturbance 04/11/2014  ? Gait instability   ? GERD (gastroesophageal reflux disease)   ? History of adenomatous polyp of colon   ? History of cardiac arrest   ? during SVD 1992  ? History of ectopic pregnancy   ? 2009-  S/P LEFT SALPINGECTOMY  ? History of panic attacks   ? Hyperlipidemia   ? IBS (irritable bowel syndrome)   ? Insomnia 03/06/2014  ? Joint pain   ? Lower extremity edema   ? Lumbar stenosis L4 -- L5 with bulging disk  ? w/ right leg weakness/ decreased mobility  ? Migraine variant with headache 05/09/2014  ? Mild obstructive sleep apnea   ? study 03-20-2014  no cpap recommended  ? Neuromuscular disorder ([place])   ? neuropathy in feet   ? Neuropathic pain of both legs 06/04/2016  ? Obesity (BMI 30-39.9) 03/05/2018  ? OSA (obstructive sleep apnea)  03/06/2014  ? 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  ? Panniculitis 03/05/2018  ? Pelvic pain 07/25/2013  ? Persistent vomiting 04/27/2013  ? Pneumonia   ? PTSD (post-traumatic stress disorder) 05/04/2014  ? Rash and nonspecific skin eruption 07/12/2015  ? Rectal bleeding 07/25/2013  ? RLQ abdominal pain   ? S/P Total vaginal hysterectomy on 01/06/13 01/06/2013  ? Sleep apnea   ? mild no cpap  ? Social anxiety disorder 05/04/2014  ? Sore throat 02/20/2014  ? Stomach ulcer   ? Tachycardia 02/20/2014  ? Type 2 diabetes mellitus ([place])   ? Weakness of right leg   ? FROM BACK PROBLEM PER PT  ? ?Past Surgical History:  ?Procedure Laterality Date  ? ABDOMINAL HYSTERECTOMY    ? partial  ? COLONOSCOPY Left 04/29/2013  ? Procedure: COLONOSCOPY;  Surgeon: Arta Silence, MD;  [facility];  Service: Endoscopy;  Laterality: Left;  ? COLONOSCOPY    ? COLONOSCOPY WITH PROPOFOL N/A 08/01/2016  ? Procedure: COLONOSCOPY WITH PROPOFOL;  Surgeon: Doran Stabler, MD;  [facility];  Service: Gastroenterology;  Laterality: N/A;  ? ECTOPIC PREGNANCY SURGERY    ? ESOPHAGOGASTRODUODENOSCOPY (EGD) WITH PROPOFOL N/A 11/10/2018  ? Procedure: ESOPHAGOGASTRODUODENOSCOPY (EGD) WITH PROPOFOL;  Surgeon: Doran Stabler, MD;  [facility];  Service: Gastroenterology;  Laterality: N/A;  ? EVALUATION UNDER ANESTHESIA WITH FISTULECTOMY N/A 04/20/2014  ? Procedure: EXAM UNDER ANESTHESIA ;  Surgeon: Leighton Ruff, MD;  Location: [facility];  Service: General;  Laterality: N/A;  ? FLEXIBLE SIGMOIDOSCOPY N/A 11/09/2013  ? Procedure: FLEXIBLE SIGMOIDOSCOPY;  Surgeon: Arta Silence, MD;  [facility];  Service: Endoscopy;  Laterality: N/A;  ? fupa removal     ? LAPAROSCOPIC CHOLECYSTECTOMY  2005  ? LAPAROSCOPIC GASTRIC SLEEVE RESECTION N/A 12/24/2020  ? Procedure: LAPAROSCOPIC GASTRIC SLEEVE RESECTION;  Surgeon: [name], MD;  [facility];  Service: General;  Laterality: N/A;   ? REFRACTIVE SURGERY    ? SPHINCTEROTOMY N/A 04/20/2014  ? Procedure:  LATERAL INTERNAL SPHINCTEROTOMY;  Surgeon: Leighton Ruff, MD;  Location: [facility];  Service: General;  Laterality: N/A;  ? TRANSTHORACIC ECHOCARDIOGRAM  12/30/2012  ? mild LVH/  ef 55-60%  ? UNILATERAL SALPINGECTOMY  2009  ? laparotomy left salpingectomy-- ectopic preg.  ? UPPER GASTROINTESTINAL ENDOSCOPY    ? UPPER GI ENDOSCOPY N/A 12/24/2020  ? Procedure: UPPER GI ENDOSCOPY;  Surgeon: [name], MD;  [facility];  Service: General;  Laterality: N/A;  ? VAGINAL HYSTERECTOMY N/A 01/06/2013  ? Procedure: HYSTERECTOMY VAGINAL;  Surgeon: Osborne Oman, MD;  Location: [facility];  Service: Gynecology;  Laterality: N/A;  ? ?Patient Active Problem List  ? Diagnosis Date Noted  ? Multinodular thyroid 03/01/2021  ? Solitary pulmonary nodule 02/21/2021  ? Frequent falls 02/21/2021  ? Weakness of both lower extremities 02/21/2021  ? Carpal tunnel syndrome of right wrist 02/21/2021  ? Acute constipation 02/21/2021  ? 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  ? Morbid obesity ([place]) 12/24/2020  ? Allergic rhinitis 06/26/2020  ? Hyperlipidemia associated with type 2 diabetes mellitus ([place]) 04/10/2020  ? Chronic pain syndrome 04/09/2020  ? Type 2 diabetes mellitus with diabetic neuropathy, without long-term current use of insulin ([place]) 04/09/2020  ? 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 12/13/2019  ? Vitamin D deficiency 04/05/2019  ? Class 2 severe obesity with serious comorbidity and body mass index (BMI) of 36.0 to 36.9 in adult [facility]) 04/05/2019  ? Obesity (BMI 30-39.9) 03/05/2018  ? Chronic diarrhea   ? Benign neoplasm of transverse colon   ? Benign neoplasm of sigmoid colon   ? Neuropathic pain of both legs 06/04/2016  ? Rash and nonspecific skin eruption 07/12/2015  ? Dandruff 03/26/2015  ? Migraine variant with headache 05/09/2014  ? GAD (generalized anxiety disorder) 05/04/2014  ? Panic disorder  with agoraphobia 05/04/2014  ? Social anxiety disorder 05/04/2014  ? PTSD (post-traumatic stress disorder) 05/04/2014  ? Depression 03/27/2014  ? OSA (obstructive sleep apnea) 03/06/2014  ? Insomnia 03/06/2014  ? History of cardiac arrest   ? S/P Total vaginal hysterectomy on 01/06/13 01/06/2013  ? 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 ? ?PCP: Elsie Stain, MD ? ?REFERRING PROVIDER: Alda Berthold, DO ? ?REFERRING DIAG:  ?M54.12 (ICD-10-CM) - Cervical radiculopathy ? ?THERAPY DIAG:  ?No diagnosis found. ? ?ONSET DATE: *** ? ?SUBJECTIVE:                                                                                                                                                                                                        ? ?  SUBJECTIVE STATEMENT: ?*** ? ?PERTINENT HISTORY:  ?*** ? ?PAIN:  ?Are you having pain? {OPRCPAIN:27236} ? ?PRECAUTIONS: {Therapy precautions:24002} ? ?WEIGHT BEARING RESTRICTIONS {Yes ***/No:24003} ? ?FALLS:  ?Has patient fallen in last 6 months? {fallsyesno:27318} ? ?LIVING ENVIRONMENT: ?Lives with: {[facility] lives with:25569::"lives with their family"} ?Lives in: {Lives in:25570} ?Stairs: {opstairs:27293} ?Has following equipment at home: {Assistive devices:23999} ? ?OCCUPATION: *** ? ?PLOF: {PLOF:24004} ? ?PATIENT GOALS *** ? ?OBJECTIVE:  ? ?DIAGNOSTIC FINDINGS:  ?*** ? ?PATIENT SURVEYS:  ?{rehab surveys:24030} ? ? ?COGNITION: ?Overall cognitive status: {cognition:24006} ? ? ?SENSATION: ?{sensation:27233} ? ?POSTURE:  ?*** ? ?PALPATION: ?***  ? ?CERVICAL ROM:  ? ?{AROM/PROM:27142} ROM A/PROM (deg) ?06/06/2021  ?Flexion   ?Extension   ?Right lateral flexion   ?Left lateral flexion   ?Right rotation   ?Left rotation   ? (Blank rows = not tested) ? ?UE ROM: ? ?{AROM/PROM:27142} ROM Right ?06/06/2021 Left ?06/06/2021  ?Shoulder flexion    ?Shoulder extension    ?Shoulder abduction    ?Shoulder adduction    ?Shoulder extension    ?Shoulder internal rotation    ?Shoulder external rotation     ?Elbow flexion    ?Elbow extension    ?Wrist flexion    ?Wrist extension    ?Wrist ulnar deviation    ?Wrist radial deviation    ?Wrist pronation    ?Wrist supination    ? (Blank rows = not tested) ? ?UE MMT: ?

## 2021-06-24 ENCOUNTER — Ambulatory Visit: Payer: Commercial Managed Care - HMO | Admitting: Critical Care Medicine

## 2021-07-17 ENCOUNTER — Ambulatory Visit: Payer: Commercial Managed Care - HMO | Admitting: Physician Assistant

## 2021-08-01 ENCOUNTER — Ambulatory Visit: Payer: Medicare Other | Admitting: Podiatry

## 2021-08-14 ENCOUNTER — Encounter: Payer: Self-pay | Admitting: Gastroenterology

## 2021-08-28 ENCOUNTER — Ambulatory Visit: Payer: Medicare Other | Attending: Neurology | Admitting: Physical Therapy

## 2021-09-25 ENCOUNTER — Encounter (INDEPENDENT_AMBULATORY_CARE_PROVIDER_SITE_OTHER): Payer: Self-pay

## 2021-09-27 ENCOUNTER — Ambulatory Visit: Payer: Self-pay

## 2021-09-27 NOTE — Telephone Encounter (Signed)
Patient was called and informed to go to urgent care and get evaluated.

## 2021-09-27 NOTE — Telephone Encounter (Signed)
     Chief Complaint: Urinary frequency, burning, pain, blood in urine. In [place] currently. Asking for medication. Symptoms: Above Frequency: 2 days ago Pertinent Negatives: Patient denies fever Disposition: '[]'$ ED /'[]'$ Urgent Care (no appt availability in office) / '[]'$ Appointment(In office/virtual)/ '[]'$  [place] [facility]/ '[]'$ Home Care/ '[]'$ Refused Recommended Disposition /'[]'$ [place] [facility]/ '[x]'$  Follow-up with PCP Additional Notes: Declines going to UC there in [place]. Please advise.  Answer Assessment - Initial Assessment Questions 1. SYMPTOM: "What's the main symptom you're concerned about?" (e.g., frequency, incontinence)     Burning , frequency, pain 2. ONSET: "When did the    start?"     2 days ago 3. PAIN: "Is there any pain?" If Yes, ask: "How bad is it?" (Scale: 1-10; mild, moderate, severe)     Moderate 4. CAUSE: "What do you think is causing the symptoms?"     UTI 5. OTHER SYMPTOMS: "Do you have any other symptoms?" (e.g., blood in urine, fever, flank pain, pain with urination)     Blood in urine 6. PREGNANCY: "Is there any chance you are pregnant?" "When was your last menstrual period?"     No  Protocols used: Urinary Symptoms-A-AH

## 2021-10-22 ENCOUNTER — Inpatient Hospital Stay: Payer: Medicare Other | Admitting: Nurse Practitioner

## 2021-10-27 ENCOUNTER — Emergency Department (HOSPITAL_COMMUNITY)
Admission: EM | Admit: 2021-10-27 | Discharge: 2021-10-28 | Discharge status: Against Medical Advice | Payer: Medicare Other | Attending: Emergency Medicine | Admitting: Emergency Medicine

## 2021-10-27 ENCOUNTER — Encounter (HOSPITAL_COMMUNITY): Payer: Self-pay | Admitting: Emergency Medicine

## 2021-10-27 DIAGNOSIS — R1010 Upper abdominal pain, unspecified: Secondary | ICD-10-CM | POA: Insufficient documentation

## 2021-10-27 DIAGNOSIS — R4182 Altered mental status, unspecified: Secondary | ICD-10-CM | POA: Diagnosis not present

## 2021-10-27 DIAGNOSIS — K625 Hemorrhage of anus and rectum: Secondary | ICD-10-CM | POA: Insufficient documentation

## 2021-10-27 DIAGNOSIS — Z87440 Personal history of urinary (tract) infections: Secondary | ICD-10-CM | POA: Insufficient documentation

## 2021-10-27 DIAGNOSIS — Z5321 Procedure and treatment not carried out due to patient leaving prior to being seen by health care provider: Secondary | ICD-10-CM | POA: Diagnosis not present

## 2021-10-27 DIAGNOSIS — R1110 Vomiting, unspecified: Secondary | ICD-10-CM | POA: Diagnosis not present

## 2021-10-27 LAB — CBC WITH DIFFERENTIAL/PLATELET
Abs Immature Granulocytes: 0.02 10*3/uL (ref 0.00–0.07)
Basophils Absolute: 0 10*3/uL (ref 0.0–0.1)
Basophils Relative: 1 %
Eosinophils Absolute: 0 10*3/uL (ref 0.0–0.5)
Eosinophils Relative: 1 %
HCT: 45.2 % (ref 36.0–46.0)
Hemoglobin: 15.1 g/dL — ABNORMAL HIGH (ref 12.0–15.0)
Immature Granulocytes: 0 %
Lymphocytes Relative: 59 %
Lymphs Abs: 2.9 10*3/uL (ref 0.7–4.0)
MCH: 31.7 pg (ref 26.0–34.0)
MCHC: 33.4 g/dL (ref 30.0–36.0)
MCV: 94.8 fL (ref 80.0–100.0)
Monocytes Absolute: 0.3 10*3/uL (ref 0.1–1.0)
Monocytes Relative: 6 %
Neutro Abs: 1.6 10*3/uL — ABNORMAL LOW (ref 1.7–7.7)
Neutrophils Relative %: 33 %
Platelets: 371 10*3/uL (ref 150–400)
RBC: 4.77 MIL/uL (ref 3.87–5.11)
RDW: 12 % (ref 11.5–15.5)
WBC: 4.9 10*3/uL (ref 4.0–10.5)
nRBC: 0 % (ref 0.0–0.2)

## 2021-10-27 LAB — COMPREHENSIVE METABOLIC PANEL
ALT: 26 U/L (ref 0–44)
AST: 19 U/L (ref 15–41)
Albumin: 3.7 g/dL (ref 3.5–5.0)
Alkaline Phosphatase: 68 U/L (ref 38–126)
Anion gap: 8 (ref 5–15)
BUN: 9 mg/dL (ref 6–20)
CO2: 24 mmol/L (ref 22–32)
Calcium: 8.8 mg/dL — ABNORMAL LOW (ref 8.9–10.3)
Chloride: 112 mmol/L — ABNORMAL HIGH (ref 98–111)
Creatinine, Ser: 0.59 mg/dL (ref 0.44–1.00)
GFR, Estimated: >60 mL/min (ref >60)
Glucose, Bld: 115 mg/dL — ABNORMAL HIGH (ref 70–99)
Potassium: 3.8 mmol/L (ref 3.5–5.1)
Sodium: 144 mmol/L (ref 135–145)
Total Bilirubin: 0.5 mg/dL (ref 0.3–1.2)
Total Protein: 8 g/dL (ref 6.5–8.1)

## 2021-10-27 LAB — I-STAT BETA HCG BLOOD, ED (MC, WL, AP ONLY): I-stat hCG, quantitative: <5 m[IU]/mL (ref <5)

## 2021-10-27 LAB — TYPE AND SCREEN
ABO/RH(D): O POS
Antibody Screen: NEGATIVE

## 2021-10-27 LAB — PROTIME-INR
INR: 1 (ref 0.8–1.2)
Prothrombin Time: 13.4 seconds (ref 11.4–15.2)

## 2021-10-27 LAB — ETHANOL: Alcohol, Ethyl (B): 118 mg/dL — ABNORMAL HIGH (ref <10)

## 2021-10-27 NOTE — ED Triage Notes (Addendum)
Per EMS, patient from home, dx with UTI x1 week. Reports completing PO abx as prescribed with continued symptoms. Per EMS, treated in [place] for UTI. Patient also c/o vomiting blood x2 days.  BP 126/84 HR 80 RR 20 CBG 166

## 2021-10-27 NOTE — ED Provider Triage Note (Signed)
Emergency Medicine Provider Triage Evaluation Note  Marisa [name] , a 46 y.o. female  was evaluated in triage.  Pt complains of vomiting blood and rectal bleeding as well as upper abdominal pain for the past few days.  I spoke with EMS who states that they were called out for "vomiting blood".  This was not mentioned by family who reported that the patient was just in [place], had IV and completed a course of oral antibiotics for UTI.  Patient can give history but is slow to respond and appears altered.  Patient denies being on a blood thinner.  Review of Systems  Positive: Rectal bleeding, vomiting, upper abdominal pain Negative: Fever  Physical Exam  LMP 11/27/2012  Gen:   Awake, somewhat unusual affect Resp:  Normal effort  MSK:   Moves extremities without difficulty  Other:  No pallor, yells in pain when I palpate her upper abdomen  Medical Decision Making  Medically screening exam initiated at 7:03 PM.  Appropriate orders placed.  Marisa [name] was informed that the remainder of the evaluation will be completed by another provider, this initial triage assessment does not replace that evaluation, and the importance of remaining in the ED until their evaluation is complete.     Carlisle Cater, PA-C 10/27/21 1911

## 2021-11-12 ENCOUNTER — Inpatient Hospital Stay: Payer: Medicare Other | Admitting: Critical Care Medicine

## 2021-11-12 NOTE — Progress Notes (Deleted)
Established Patient Office Visit  Subjective:  Patient ID: Marisa [name], female    DOB: Nov 25, 1975  Age: 46 y.o. MRN: [id number]  CC:  No chief complaint on file.   HPI Marisa [name] is a 46 y.o. female who presents for follow up visit today.   1.4.2023 She has noticed some worsening of her neuropathy recently, with pain in her right leg and foot. She notes that her carpal tunnel syndrome in her right hand has worsened over the past six months as well which concerns her. She takes Gabapentin for this which helps. Despite these symptoms, her diabetes appears markedly improved, with her A1C at today's visit at 5.4% and her point of care blood sugar at 104 mg/dL. She is not taking any medications for diabetes at this point in time.   Of note, Marisa [name] had a gastrectomy sleeve bariatric surgery procedure on 12/24/2020. She states she is doing well at this point post-operatively and she has followed up with her surgeon and nutritionist. She continues to take ondansetron for nausea. She says she mostly needs this when she is taking the vitamin supplements that were given to her after surgery, as the vitamins make her nauseous. She would like refills on that today. She has also had some issues with constipation since her surgery. She has a very hard bowel movement every other day and she has to strain. Her bowel movements are painful and are accompanied by rectal bleeding. She says she is drinking lots of fluids and has been taking Miralax mixed in water for this.  Stewart's migraine headaches have not improved since she was last seen in office. She continues to have bad headaches about every other day, which she says are very painful and require her to lie down. Topomax was prescribed for this condition, but does not seem to improve her symptoms at all.   She states that she has been much more anxious lately. She says that the Trazadone helps her with sleep, but she would like  something else to help her manage her anxiety better.  [name] is also concerned about a 1 cm lung nodule that was found incidentally on CT on 01/04/2021 during a post op ED visit for nausea and chest pain  CT Angio was done to r/o PE and nodule found incidentally.  She is a former smoker and has had exposure to secondhand smoke. She is due for another CT scan of the lung at this time.   9/26 In ED for ETOH AMS 10/27/21 Past Medical History:  Diagnosis Date   Abdominal pain 04/26/2013   Abnormal uterine bleeding (AUB) 10/11/2012   Acute bronchitis    Allergy    Anal pain    chronic   Anemia    Anxiety    Asthma    exacerbation 02-28-2014 and 02-23-2014 secondary to Rhinovirus   Atypical chest pain 04/26/2013   Benign neoplasm of sigmoid colon    Benign neoplasm of transverse colon    Carbuncle of labium 07/12/2015   Chest pain 02/20/2014   Chronic diarrhea    Chronic headaches    Chronic low back pain    Cigarette nicotine dependence without complication 47/10/2955   Cyst of right ovary    Dandruff 03/26/2015   Diabetes mellitus without complication ([place]) 47/34/0370   Diabetic neuropathy, painful ([place]) 12/13/2019   Difficult intravenous access    PER PT NEEDS PICC LINE   Dyspnea 09/07/2012   [name] 08/2012:  No obstruction by FEV1%, but  probable restriction.     Falls 03/27/2014   Food allergy    Mushrooms, shellfish   GAD (generalized anxiety disorder) 05/04/2014   Gait disturbance 04/11/2014   Gait instability    GERD (gastroesophageal reflux disease)    History of adenomatous polyp of colon    History of cardiac arrest    during SVD 1992   History of ectopic pregnancy    2009-  S/P LEFT SALPINGECTOMY   History of panic attacks    Hyperlipidemia    IBS (irritable bowel syndrome)    Insomnia 03/06/2014   Joint pain    Lower extremity edema    Lumbar stenosis L4 -- L5 with bulging disk   w/ right leg weakness/ decreased mobility   Migraine variant with headache  05/09/2014   Mild obstructive sleep apnea    study 03-20-2014  no cpap recommended   Neuromuscular disorder (HCC)    neuropathy in feet    Neuropathic pain of both legs 06/04/2016   Obesity (BMI 30-39.9) 03/05/2018   OSA (obstructive sleep apnea) 03/06/2014   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   Panniculitis 03/05/2018   Pelvic pain 07/25/2013   Persistent vomiting 04/27/2013   Pneumonia    PTSD (post-traumatic stress disorder) 05/04/2014   Rash and nonspecific skin eruption 07/12/2015   Rectal bleeding 07/25/2013   RLQ abdominal pain    S/P Total vaginal hysterectomy on 01/06/13 01/06/2013   Sleep apnea    mild no cpap   Social anxiety disorder 05/04/2014   Sore throat 02/20/2014   Stomach ulcer    Tachycardia 02/20/2014   Type 2 diabetes mellitus (HCC)    Weakness of right leg    FROM BACK PROBLEM PER PT    Past Surgical History:  Procedure Laterality Date   ABDOMINAL HYSTERECTOMY     partial   COLONOSCOPY Left 04/29/2013   Procedure: COLONOSCOPY;  Surgeon: Arta Silence, MD;  Location: [facility];  Service: Endoscopy;  Laterality: Left;   COLONOSCOPY     COLONOSCOPY WITH PROPOFOL N/A 08/01/2016   Procedure: COLONOSCOPY WITH PROPOFOL;  Surgeon: Doran Stabler, MD;  Location: [facility];  Service: Gastroenterology;  Laterality: N/A;   ECTOPIC PREGNANCY SURGERY     ESOPHAGOGASTRODUODENOSCOPY (EGD) WITH PROPOFOL N/A 11/10/2018   Procedure: ESOPHAGOGASTRODUODENOSCOPY (EGD) WITH PROPOFOL;  Surgeon: Doran Stabler, MD;  Location: [facility];  Service: Gastroenterology;  Laterality: N/A;   EVALUATION UNDER ANESTHESIA WITH FISTULECTOMY N/A 04/20/2014   Procedure: EXAM UNDER ANESTHESIA ;  Surgeon: Leighton Ruff, MD;  [facility];  Service: General;  Laterality: N/A;   FLEXIBLE SIGMOIDOSCOPY N/A 11/09/2013   Procedure: FLEXIBLE SIGMOIDOSCOPY;  Surgeon: Arta Silence, MD;  Location: [facility];  Service: Endoscopy;  Laterality:  N/A;   fupa removal      LAPAROSCOPIC CHOLECYSTECTOMY  2005   LAPAROSCOPIC GASTRIC SLEEVE RESECTION N/A 12/24/2020   Procedure: LAPAROSCOPIC GASTRIC SLEEVE RESECTION;  Surgeon: [name], MD;  Location: [facility];  Service: General;  Laterality: N/A;   REFRACTIVE SURGERY     SPHINCTEROTOMY N/A 04/20/2014   Procedure:  LATERAL INTERNAL SPHINCTEROTOMY;  Surgeon: Leighton Ruff, MD;  Location: [facility];  Service: General;  Laterality: N/A;   TRANSTHORACIC ECHOCARDIOGRAM  12/30/2012   mild LVH/  ef 55-60%   UNILATERAL SALPINGECTOMY  2009   laparotomy left salpingectomy-- ectopic preg.   UPPER GASTROINTESTINAL ENDOSCOPY     UPPER GI ENDOSCOPY N/A 12/24/2020   Procedure: UPPER GI ENDOSCOPY;  Surgeon: [name],  Victorino Sparrow, MD;  Location: [facility];  Service: General;  Laterality: N/A;   VAGINAL HYSTERECTOMY N/A 01/06/2013   Procedure: HYSTERECTOMY VAGINAL;  Surgeon: Osborne Oman, MD;  Location: [facility];  Service: Gynecology;  Laterality: N/A;    Family History  Problem Relation Age of Onset   Hypertension Mother    Diabetes Mother    Allergies Mother    Heart disease Mother    Clotting disorder Mother    Stroke Mother    Kidney disease Mother    Thyroid disease Mother    Cancer Father    Hyperlipidemia Father    Hypertension Father    Colon cancer Father    Liver disease Father    Heart disease Maternal Grandmother    Breast cancer Maternal Grandmother 28   Schizophrenia Sister    Bipolar disorder Sister    Clotting disorder Sister    Bipolar disorder Brother    Kidney disease Brother    Bipolar disorder Sister    Pancreatic cancer Maternal Aunt    Prostate cancer Maternal Uncle    Liver cancer Maternal Grandfather    Rectal cancer Maternal Grandfather    Liver cancer Paternal Grandfather    Colon polyps Neg Hx    Esophageal cancer Neg Hx    Stomach cancer Neg Hx     Social History   Socioeconomic History   Marital status: Married    Spouse name: Not  on file   Number of children: 1   Years of education: Not on file   Highest education level: Not on file  Occupational History   Occupation: disability  Tobacco Use   Smoking status: Former    Packs/day: 0.20    Years: 11.00    Total pack years: 2.20    Types: Cigarettes    Quit date: 11/17/2013    Years since quitting: 7.9   Smokeless tobacco: Never   Tobacco comments:    2 years quit  Vaping Use   Vaping Use: Never used  Substance and Sexual Activity   Alcohol use: No    Alcohol/week: 0.0 standard drinks of alcohol   Drug use: No   Sexual activity: Yes    Birth control/protection: Surgical  Other Topics Concern   Not on file  Social History Narrative   Lives with sister   Drinks no caffeine   Right Handed    Lives in a one story home    Social Determinants of Health   Financial Resource Strain: Not on file  Food Insecurity: Not on file  Transportation Needs: Not on file  Physical Activity: Not on file  Stress: Not on file  Social Connections: Not on file  Intimate Partner Violence: Not on file    Outpatient Medications Prior to Visit  Medication Sig Dispense Refill   Accu-Chek Softclix Lancets lancets Use as instructed 100 each 12   albuterol (PROVENTIL HFA) 108 (90 Base) MCG/ACT inhaler Inhale 2 puffs into the lungs every 6 (six) hours as needed for wheezing or shortness of breath. 6.7 g 2   albuterol (PROVENTIL) (2.5 MG/3ML) 0.083% nebulizer solution Take 3 mLs (2.5 mg total) by nebulization every 6 (six) hours as needed for wheezing or shortness of breath. 150 mL 1   azithromycin (ZITHROMAX) 250 MG tablet Take 2 tabs PO day 1, then take 1 tab PO once daily 6 tablet 0   benzonatate (TESSALON) 200 MG capsule Take 1 capsule (200 mg total) by mouth 2 (two) times daily as needed  for cough. 20 capsule 0   Blood Glucose Monitoring Suppl (ACCU-CHEK GUIDE) w/Device KIT Check blood sugars daily 1 kit 0   cetirizine (ZYRTEC) 10 MG tablet Take 1 tablet (10 mg total) by  mouth daily. 30 tablet 11   clindamycin-benzoyl peroxide (BENZACLIN) gel Apply topically 2 (two) times daily. To affected area on right lower leg 25 g 0   EPINEPHrine 0.3 mg/0.3 mL IJ SOAJ injection Inject 0.3 mg into the muscle as needed for anaphylaxis. 1 each 1   gabapentin (NEURONTIN) 100 MG capsule Take 2 capsules (200 mg total) by mouth 2 (two) times daily. 120 capsule 3   glucose blood (ACCU-CHEK GUIDE) test strip Use as instructed 100 each 12   hydrOXYzine (VISTARIL) 25 MG capsule Take 1 capsule (25 mg total) by mouth every 8 (eight) hours as needed. 60 capsule 1   Lancets Misc. (ACCU-CHEK SOFTCLIX LANCET DEV) KIT Use to check blood sugar 1 kit 0   ondansetron (ZOFRAN-ODT) 4 MG disintegrating tablet Dissolve 1 tablet (4 mg total) by mouth every 6 (six) hours as needed for nausea or vomiting. 20 tablet 1   pantoprazole (PROTONIX) 40 MG tablet Take 1 tablet (40 mg total) by mouth daily. 90 tablet 2   SUMAtriptan (IMITREX) 50 MG tablet Take 1 tablet (50 mg total) by mouth every 2 (two) hours as needed for migraine. May repeat in 2 hours if headache persists or recurs. 20 tablet 0   traZODone (DESYREL) 100 MG tablet Take 1-2 tablets (100-200 mg total) by mouth at bedtime as needed for sleep. 60 tablet 1   No facility-administered medications prior to visit.    Allergies  Allergen Reactions   Asa [Aspirin] Anaphylaxis and Hives    Hives, chest tightness    Mushroom Extract Complex Anaphylaxis, Swelling and Other (See Comments)    Reaction:  Eye swelling   Penicillins Anaphylaxis and Other (See Comments)    Has patient had a PCN reaction causing immediate rash, facial/tongue/throat swelling, SOB or lightheadedness with hypotension: Yes Has patient had a PCN reaction causing severe rash involving mucus membranes or skin necrosis: No Has patient had a PCN reaction that required hospitalization No Has patient had a PCN reaction occurring within the last 10 years: No If all of the above  answers are "NO", then may proceed with Cephalosporin use.   Shellfish Allergy Anaphylaxis   Triamcinolone Other (See Comments)    Skin issues     ROS Review of Systems  Constitutional:  Negative for fatigue and fever.  HENT: Negative.    Eyes:  Positive for pain (dryness on right eyelid) and itching (right eye, feels deep inside).  Respiratory:  Positive for chest tightness and shortness of breath (inhaler helps, steam helps).   Cardiovascular:  Positive for chest pain (stabbing while breathing).  Gastrointestinal:  Positive for anal bleeding, constipation, nausea (with vitamins) and rectal pain.  Endocrine: Positive for polyuria. Negative for cold intolerance.  Genitourinary: Negative.   Musculoskeletal:  Positive for myalgias.  Skin:  Positive for rash.  Neurological:  Positive for weakness (fell when trying to walk recently), numbness (right finger; as well as leg pain) and headaches (worsening migraines).  Psychiatric/Behavioral:  Negative for suicidal ideas.       Objective:    Physical Exam Vitals reviewed.  Constitutional:      Appearance: Normal appearance. She is obese.  HENT:     Head: Normocephalic and atraumatic.  Cardiovascular:     Rate and Rhythm: Normal rate and regular  rhythm.     Pulses:          Dorsalis pedis pulses are 2+ on the right side and 2+ on the left side.       Posterior tibial pulses are 2+ on the right side and 2+ on the left side.     Heart sounds: No murmur heard.    No friction rub. No gallop.  Pulmonary:     Effort: Pulmonary effort is normal.     Breath sounds: Normal breath sounds. No wheezing or rhonchi.  Musculoskeletal:     Right lower leg: No edema.     Left lower leg: No edema.  Feet:     Right foot:     Skin integrity: Skin integrity normal.     Left foot:     Skin integrity: Skin integrity normal.  Skin:    General: Skin is warm and dry.     Findings: Rash (mild follicular type rash on right lower leg) present.   Neurological:     General: No focal deficit present.     Mental Status: She is alert. Mental status is at baseline.  Psychiatric:        Attention and Perception: Attention normal.        Mood and Affect: Mood normal.        Speech: Speech normal.        Behavior: Behavior normal.        Thought Content: Thought content normal.        Cognition and Memory: Cognition normal.     LMP 11/27/2012  Wt Readings from Last 3 Encounters:  05/29/21 205 lb 4.8 oz (93.1 kg)  04/05/21 218 lb (98.9 kg)  02/21/21 227 lb 9.6 oz (103.2 kg)     Health Maintenance Due  Topic Date Due   OPHTHALMOLOGY EXAM  01/23/2021   HEMOGLOBIN A1C  08/21/2021   INFLUENZA VACCINE  09/17/2021    There are no preventive care reminders to display for this patient.  Lab Results  Component Value Date   TSH 0.821 04/09/2021   Lab Results  Component Value Date   WBC 4.9 10/27/2021   HGB 15.1 (H) 10/27/2021   HCT 45.2 10/27/2021   MCV 94.8 10/27/2021   PLT 371 10/27/2021   Lab Results  Component Value Date   NA 144 10/27/2021   K 3.8 10/27/2021   CO2 24 10/27/2021   GLUCOSE 115 (H) 10/27/2021   BUN 9 10/27/2021   CREATININE 0.59 10/27/2021   BILITOT 0.5 10/27/2021   ALKPHOS 68 10/27/2021   AST 19 10/27/2021   ALT 26 10/27/2021   PROT 8.0 10/27/2021   ALBUMIN 3.7 10/27/2021   CALCIUM 8.8 (L) 10/27/2021   ANIONGAP 8 10/27/2021   EGFR 103 01/02/2021   Lab Results  Component Value Date   CHOL 178 11/25/2019   Lab Results  Component Value Date   HDL 37 (L) 11/25/2019   Lab Results  Component Value Date   LDLCALC 105 (H) 11/25/2019   Lab Results  Component Value Date   TRIG 209 (H) 11/25/2019   Lab Results  Component Value Date   CHOLHDL 4.8 (H) 11/25/2019   Lab Results  Component Value Date   HGBA1C 5.9 02/21/2021      Assessment & Plan:   Problem List Items Addressed This Visit   None  No orders of the defined types were placed in this encounter.   Follow-up:  Follow  up with Dr. Joya Gaskins in 4  months. I have seen and examined this patient with the mid-level provider and agree with the above note .   Asencion Noble, MD

## 2022-03-27 ENCOUNTER — Encounter: Payer: 59 | Admitting: Critical Care Medicine

## 2022-03-27 NOTE — Progress Notes (Deleted)
Complete physical exam  Patient: Marisa [name]   DOB: [birth year]   47 y.o. Female  MRN: [id number]  Subjective:    No chief complaint on file.  Not seen since 02/21/21   Marisa [name] is a 47 y.o. female who presents today for a complete physical exam. She reports consuming a {diet types:17450} diet. {types:19826} She generally feels {DESC; WELL/FAIRLY WELL/POORLY:18703}. She reports sleeping {DESC; WELL/FAIRLY WELL/POORLY:18703}. She {does/does not:200015} have additional problems to discuss today.    Most recent fall risk assessment:    04/05/2021    8:55 AM  Fall Risk   Falls in the past year? 1  Number falls in past yr: 1  Injury with Fall? 0     Most recent depression screenings:    02/21/2021   11:15 AM 01/02/2021   10:58 AM  PHQ 2/9 Scores  PHQ - 2 Score 2 0  PHQ- 9 Score 7 3    {VISON DENTAL STD PSA (Optional):27386}  {History (Optional):23778}  Patient Care Team: Elsie Stain, MD as PCP - General (Pulmonary Disease) Alda Berthold, DO as Consulting Physician (Neurology)   Outpatient Medications Prior to Visit  Medication Sig   Accu-Chek Softclix Lancets lancets Use as instructed   albuterol (PROVENTIL HFA) 108 (90 Base) MCG/ACT inhaler Inhale 2 puffs into the lungs every 6 (six) hours as needed for wheezing or shortness of breath.   albuterol (PROVENTIL) (2.5 MG/3ML) 0.083% nebulizer solution Take 3 mLs (2.5 mg total) by nebulization every 6 (six) hours as needed for wheezing or shortness of breath.   azithromycin (ZITHROMAX) 250 MG tablet Take 2 tabs PO day 1, then take 1 tab PO once daily   benzonatate (TESSALON) 200 MG capsule Take 1 capsule (200 mg total) by mouth 2 (two) times daily as needed for cough.   Blood Glucose Monitoring Suppl (ACCU-CHEK GUIDE) w/Device KIT Check blood sugars daily   cetirizine (ZYRTEC) 10 MG tablet Take 1 tablet (10 mg total) by mouth daily.   clindamycin-benzoyl peroxide (BENZACLIN) gel Apply topically 2  (two) times daily. To affected area on right lower leg   EPINEPHrine 0.3 mg/0.3 mL IJ SOAJ injection Inject 0.3 mg into the muscle as needed for anaphylaxis.   gabapentin (NEURONTIN) 100 MG capsule Take 2 capsules (200 mg total) by mouth 2 (two) times daily.   glucose blood (ACCU-CHEK GUIDE) test strip Use as instructed   hydrOXYzine (VISTARIL) 25 MG capsule Take 1 capsule (25 mg total) by mouth every 8 (eight) hours as needed.   Lancets Misc. (ACCU-CHEK SOFTCLIX LANCET DEV) KIT Use to check blood sugar   ondansetron (ZOFRAN-ODT) 4 MG disintegrating tablet Dissolve 1 tablet (4 mg total) by mouth every 6 (six) hours as needed for nausea or vomiting.   pantoprazole (PROTONIX) 40 MG tablet Take 1 tablet (40 mg total) by mouth daily.   SUMAtriptan (IMITREX) 50 MG tablet Take 1 tablet (50 mg total) by mouth every 2 (two) hours as needed for migraine. May repeat in 2 hours if headache persists or recurs.   traZODone (DESYREL) 100 MG tablet Take 1-2 tablets (100-200 mg total) by mouth at bedtime as needed for sleep.   No facility-administered medications prior to visit.    ROS        Objective:     LMP 11/27/2012  {Vitals History (Optional):23777}  Physical Exam   No results found for any visits on 03/27/22. {Show previous labs (optional):23779}    Assessment & Plan:    Routine  Health Maintenance and Physical Exam  Immunization History  Administered Date(s) Administered   Influenza,inj,Quad PF,6+ Mos 02/07/2013, 03/02/2014   Moderna Sars-Covid-2 Vaccination 07/18/2019, 08/15/2019   PPD Test 12/20/2012   Tdap 04/09/2020    Health Maintenance  Topic Date Due   Medicare Annual Wellness (AWV)  Never done   OPHTHALMOLOGY EXAM  01/23/2021   HEMOGLOBIN A1C  08/21/2021   INFLUENZA VACCINE  09/17/2021   Diabetic kidney evaluation - Urine ACR  02/21/2022   FOOT EXAM  02/21/2022   Diabetic kidney evaluation - eGFR measurement  10/28/2022   COLONOSCOPY (Pts 45-16yr Insurance  coverage will need to be confirmed)  08/02/2026   DTaP/Tdap/Td (2 - Td or Tdap) 04/09/2030   Hepatitis C Screening  Completed   HIV Screening  Completed   HPV VACCINES  Aged Out   PAP SMEAR-Modifier  Discontinued   COVID-19 Vaccine  Discontinued    Discussed health benefits of physical activity, and encouraged her to engage in regular exercise appropriate for her age and condition.  Problem List Items Addressed This Visit   None  No follow-ups on file.     PAsencion Noble MD

## 2022-05-19 NOTE — Progress Notes (Deleted)
Established Patient Office Visit  Subjective:  Patient ID: Marisa [name], female    DOB: Jul 04, 1975  Age: 47 y.o. MRN: [id number]  CC:  No chief complaint on file.   HPI 02/2021 Marisa [name] is a 47 y.o. female who presents for follow up visit today. She has noticed some worsening of her neuropathy recently, with pain in her right leg and foot. She notes that her carpal tunnel syndrome in her right hand has worsened over the past six months as well which concerns her. She takes Gabapentin for this which helps. Despite these symptoms, her diabetes appears markedly improved, with her A1C at today's visit at 5.4% and her point of care blood sugar at 104 mg/dL. She is not taking any medications for diabetes at this point in time.   Of note, [name] had a gastrectomy sleeve bariatric surgery procedure on 12/24/2020. She states she is doing well at this point post-operatively and she has followed up with her surgeon and nutritionist. She continues to take ondansetron for nausea. She says she mostly needs this when she is taking the vitamin supplements that were given to her after surgery, as the vitamins make her nauseous. She would like refills on that today. She has also had some issues with constipation since her surgery. She has a very hard bowel movement every other day and she has to strain. Her bowel movements are painful and are accompanied by rectal bleeding. She says she is drinking lots of fluids and has been taking Miralax mixed in water for this.  Carely's migraine headaches have not improved since she was last seen in office. She continues to have bad headaches about every other day, which she says are very painful and require her to lie down. Topomax was prescribed for this condition, but does not seem to improve her symptoms at all.   She states that she has been much more anxious lately. She says that the Trazadone helps her with sleep, but she would like something  else to help her manage her anxiety better.  Keltie is also concerned about a 1 cm lung nodule that was found incidentally on CT on 01/04/2021 during a post op ED visit for nausea and chest pain  CT Angio was done to r/o PE and nodule found incidentally.  She is a former smoker and has had exposure to secondhand smoke. She is due for another CT scan of the lung at this time.   05/20/22 Not seen since 02/2021  Past Medical History:  Diagnosis Date   Abdominal pain 04/26/2013   Abnormal uterine bleeding (AUB) 10/11/2012   Acute bronchitis    Allergy    Anal pain    chronic   Anemia    Anxiety    Asthma    exacerbation 02-28-2014 and 02-23-2014 secondary to Rhinovirus   Atypical chest pain 04/26/2013   Benign neoplasm of sigmoid colon    Benign neoplasm of transverse colon    Carbuncle of labium 07/12/2015   Chest pain 02/20/2014   Chronic diarrhea    Chronic headaches    Chronic low back pain    Cigarette nicotine dependence without complication [id number]   Cyst of right ovary    Dandruff 03/26/2015   Diabetes mellitus without complication ([place]) [id number]   Diabetic neuropathy, painful ([place]) 12/13/2019   Difficult intravenous access    PER PT NEEDS PICC LINE   Dyspnea 09/07/2012   [name] 08/2012:  No obstruction by FEV1%, but probable restriction.  Falls 03/27/2014   Food allergy    Mushrooms, shellfish   GAD (generalized anxiety disorder) 05/04/2014   Gait disturbance 04/11/2014   Gait instability    GERD (gastroesophageal reflux disease)    History of adenomatous polyp of colon    History of cardiac arrest    during SVD 1992   History of ectopic pregnancy    2009-  S/P LEFT SALPINGECTOMY   History of panic attacks    Hyperlipidemia    IBS (irritable bowel syndrome)    Insomnia 03/06/2014   Joint pain    Lower extremity edema    Lumbar stenosis L4 -- L5 with bulging disk   w/ right leg weakness/ decreased mobility   Migraine variant with headache 05/09/2014    Mild obstructive sleep apnea    study 03-20-2014  no cpap recommended   Neuromuscular disorder (HCC)    neuropathy in feet    Neuropathic pain of both legs 06/04/2016   Obesity (BMI 30-39.9) 03/05/2018   OSA (obstructive sleep apnea) 03/06/2014   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   Panniculitis 03/05/2018   Pelvic pain 07/25/2013   Persistent vomiting 04/27/2013   Pneumonia    PTSD (post-traumatic stress disorder) 05/04/2014   Rash and nonspecific skin eruption 07/12/2015   Rectal bleeding 07/25/2013   RLQ abdominal pain    S/P Total vaginal hysterectomy on 01/06/13 01/06/2013   Sleep apnea    mild no cpap   Social anxiety disorder 05/04/2014   Sore throat 02/20/2014   Stomach ulcer    Tachycardia 02/20/2014   Type 2 diabetes mellitus (HCC)    Weakness of right leg    FROM BACK PROBLEM PER PT    Past Surgical History:  Procedure Laterality Date   ABDOMINAL HYSTERECTOMY     partial   COLONOSCOPY Left 04/29/2013   Procedure: COLONOSCOPY;  Surgeon: Arta Silence, MD;  Location: [facility];  Service: Endoscopy;  Laterality: Left;   COLONOSCOPY     COLONOSCOPY WITH PROPOFOL N/A 08/01/2016   Procedure: COLONOSCOPY WITH PROPOFOL;  Surgeon: Doran Stabler, MD;  Location: [facility];  Service: Gastroenterology;  Laterality: N/A;   ECTOPIC PREGNANCY SURGERY     ESOPHAGOGASTRODUODENOSCOPY (EGD) WITH PROPOFOL N/A 11/10/2018   Procedure: ESOPHAGOGASTRODUODENOSCOPY (EGD) WITH PROPOFOL;  Surgeon: Doran Stabler, MD;  Location: [facility];  Service: Gastroenterology;  Laterality: N/A;   EVALUATION UNDER ANESTHESIA WITH FISTULECTOMY N/A 04/20/2014   Procedure: EXAM UNDER ANESTHESIA ;  Surgeon: Leighton Ruff, MD;  Location: [facility];  Service: General;  Laterality: N/A;   FLEXIBLE SIGMOIDOSCOPY N/A 11/09/2013   Procedure: FLEXIBLE SIGMOIDOSCOPY;  Surgeon: Arta Silence, MD;  Location: [facility];  Service: Endoscopy;  Laterality: N/A;   fupa  removal      LAPAROSCOPIC CHOLECYSTECTOMY  2005   LAPAROSCOPIC GASTRIC SLEEVE RESECTION N/A 12/24/2020   Procedure: LAPAROSCOPIC GASTRIC SLEEVE RESECTION;  Surgeon: [name], MD;  Location: [facility];  Service: General;  Laterality: N/A;   REFRACTIVE SURGERY     SPHINCTEROTOMY N/A 04/20/2014   Procedure:  LATERAL INTERNAL SPHINCTEROTOMY;  Surgeon: Leighton Ruff, MD;  Location: [facility];  Service: General;  Laterality: N/A;   TRANSTHORACIC ECHOCARDIOGRAM  12/30/2012   mild LVH/  ef 55-60%   UNILATERAL SALPINGECTOMY  2009   laparotomy left salpingectomy-- ectopic preg.   UPPER GASTROINTESTINAL ENDOSCOPY     UPPER GI ENDOSCOPY N/A 12/24/2020   Procedure: UPPER GI ENDOSCOPY;  Surgeon: [name], MD;  Location: [facility];  Service: General;  Laterality: N/A;   VAGINAL HYSTERECTOMY N/A 01/06/2013   Procedure: HYSTERECTOMY VAGINAL;  Surgeon: Osborne Oman, MD;  Location: [facility];  Service: Gynecology;  Laterality: N/A;    Family History  Problem Relation Age of Onset   Hypertension Mother    Diabetes Mother    Allergies Mother    Heart disease Mother    Clotting disorder Mother    Stroke Mother    Kidney disease Mother    Thyroid disease Mother    Cancer Father    Hyperlipidemia Father    Hypertension Father    Colon cancer Father    Liver disease Father    Heart disease Maternal Grandmother    Breast cancer Maternal Grandmother 72   Schizophrenia Sister    Bipolar disorder Sister    Clotting disorder Sister    Bipolar disorder Brother    Kidney disease Brother    Bipolar disorder Sister    Pancreatic cancer Maternal Aunt    Prostate cancer Maternal Uncle    Liver cancer Maternal Grandfather    Rectal cancer Maternal Grandfather    Liver cancer Paternal Grandfather    Colon polyps Neg Hx    Esophageal cancer Neg Hx    Stomach cancer Neg Hx     Social History   Socioeconomic History   Marital status: Married    Spouse name: Not on file    Number of children: 1   Years of education: Not on file   Highest education level: Not on file  Occupational History   Occupation: disability  Tobacco Use   Smoking status: Former    Packs/day: 0.20    Years: 11.00    Additional pack years: 0.00    Total pack years: 2.20    Types: Cigarettes    Quit date: 11/17/2013    Years since quitting: 8.5   Smokeless tobacco: Never   Tobacco comments:    2 years quit  Vaping Use   Vaping Use: Never used  Substance and Sexual Activity   Alcohol use: No    Alcohol/week: 0.0 standard drinks of alcohol   Drug use: No   Sexual activity: Yes    Birth control/protection: Surgical  Other Topics Concern   Not on file  Social History Narrative   Lives with sister   Drinks no caffeine   Right Handed    Lives in a one story home    Social Determinants of Health   Financial Resource Strain: Not on file  Food Insecurity: Not on file  Transportation Needs: Not on file  Physical Activity: Not on file  Stress: Not on file  Social Connections: Not on file  Intimate Partner Violence: Not on file    Outpatient Medications Prior to Visit  Medication Sig Dispense Refill   Accu-Chek Softclix Lancets lancets Use as instructed 100 each 12   albuterol (PROVENTIL HFA) 108 (90 Base) MCG/ACT inhaler Inhale 2 puffs into the lungs every 6 (six) hours as needed for wheezing or shortness of breath. 6.7 g 2   albuterol (PROVENTIL) (2.5 MG/3ML) 0.083% nebulizer solution Take 3 mLs (2.5 mg total) by nebulization every 6 (six) hours as needed for wheezing or shortness of breath. 150 mL 1   azithromycin (ZITHROMAX) 250 MG tablet Take 2 tabs PO day 1, then take 1 tab PO once daily 6 tablet 0   benzonatate (TESSALON) 200 MG capsule Take 1 capsule (200 mg total) by mouth 2 (two) times daily as  needed for cough. 20 capsule 0   Blood Glucose Monitoring Suppl (ACCU-CHEK GUIDE) w/Device KIT Check blood sugars daily 1 kit 0   cetirizine (ZYRTEC) 10 MG tablet Take 1 tablet  (10 mg total) by mouth daily. 30 tablet 11   clindamycin-benzoyl peroxide (BENZACLIN) gel Apply topically 2 (two) times daily. To affected area on right lower leg 25 g 0   EPINEPHrine 0.3 mg/0.3 mL IJ SOAJ injection Inject 0.3 mg into the muscle as needed for anaphylaxis. 1 each 1   gabapentin (NEURONTIN) 100 MG capsule Take 2 capsules (200 mg total) by mouth 2 (two) times daily. 120 capsule 3   glucose blood (ACCU-CHEK GUIDE) test strip Use as instructed 100 each 12   hydrOXYzine (VISTARIL) 25 MG capsule Take 1 capsule (25 mg total) by mouth every 8 (eight) hours as needed. 60 capsule 1   Lancets Misc. (ACCU-CHEK SOFTCLIX LANCET DEV) KIT Use to check blood sugar 1 kit 0   ondansetron (ZOFRAN-ODT) 4 MG disintegrating tablet Dissolve 1 tablet (4 mg total) by mouth every 6 (six) hours as needed for nausea or vomiting. 20 tablet 1   pantoprazole (PROTONIX) 40 MG tablet Take 1 tablet (40 mg total) by mouth daily. 90 tablet 2   SUMAtriptan (IMITREX) 50 MG tablet Take 1 tablet (50 mg total) by mouth every 2 (two) hours as needed for migraine. May repeat in 2 hours if headache persists or recurs. 20 tablet 0   traZODone (DESYREL) 100 MG tablet Take 1-2 tablets (100-200 mg total) by mouth at bedtime as needed for sleep. 60 tablet 1   No facility-administered medications prior to visit.    Allergies  Allergen Reactions   Asa [Aspirin] Anaphylaxis and Hives    Hives, chest tightness    Mushroom Extract Complex Anaphylaxis, Swelling and Other (See Comments)    Reaction:  Eye swelling   Penicillins Anaphylaxis and Other (See Comments)    Has patient had a PCN reaction causing immediate rash, facial/tongue/throat swelling, SOB or lightheadedness with hypotension: Yes Has patient had a PCN reaction causing severe rash involving mucus membranes or skin necrosis: No Has patient had a PCN reaction that required hospitalization No Has patient had a PCN reaction occurring within the last 10 years: No If all  of the above answers are "NO", then may proceed with Cephalosporin use.   Shellfish Allergy Anaphylaxis   Triamcinolone Other (See Comments)    Skin issues     ROS Review of Systems  Constitutional:  Negative for fatigue and fever.  HENT: Negative.    Eyes:  Positive for pain (dryness on right eyelid) and itching (right eye, feels deep inside).  Respiratory:  Positive for chest tightness and shortness of breath (inhaler helps, steam helps).   Cardiovascular:  Positive for chest pain (stabbing while breathing).  Gastrointestinal:  Positive for anal bleeding, constipation, nausea (with vitamins) and rectal pain.  Endocrine: Positive for polyuria. Negative for cold intolerance.  Genitourinary: Negative.   Musculoskeletal:  Positive for myalgias.  Skin:  Positive for rash.  Neurological:  Positive for weakness (fell when trying to walk recently), numbness (right finger; as well as leg pain) and headaches (worsening migraines).  Psychiatric/Behavioral:  Negative for suicidal ideas.       Objective:    Physical Exam Vitals reviewed.  Constitutional:      Appearance: Normal appearance. She is obese.  HENT:     Head: Normocephalic and atraumatic.  Cardiovascular:     Rate and Rhythm: Normal rate and  regular rhythm.     Pulses:          Dorsalis pedis pulses are 2+ on the right side and 2+ on the left side.       Posterior tibial pulses are 2+ on the right side and 2+ on the left side.     Heart sounds: No murmur heard.    No friction rub. No gallop.  Pulmonary:     Effort: Pulmonary effort is normal.     Breath sounds: Normal breath sounds. No wheezing or rhonchi.  Musculoskeletal:     Right lower leg: No edema.     Left lower leg: No edema.  Feet:     Right foot:     Skin integrity: Skin integrity normal.     Left foot:     Skin integrity: Skin integrity normal.  Skin:    General: Skin is warm and dry.     Findings: Rash (mild follicular type rash on right lower leg)  present.  Neurological:     General: No focal deficit present.     Mental Status: She is alert. Mental status is at baseline.  Psychiatric:        Attention and Perception: Attention normal.        Mood and Affect: Mood normal.        Speech: Speech normal.        Behavior: Behavior normal.        Thought Content: Thought content normal.        Cognition and Memory: Cognition normal.     LMP 11/27/2012  Wt Readings from Last 3 Encounters:  05/29/21 205 lb 4.8 oz (93.1 kg)  04/05/21 218 lb (98.9 kg)  02/21/21 227 lb 9.6 oz (103.2 kg)     Health Maintenance Due  Topic Date Due   Medicare Annual Wellness (AWV)  Never done   OPHTHALMOLOGY EXAM  01/23/2021   HEMOGLOBIN A1C  08/21/2021   Diabetic kidney evaluation - Urine ACR  02/21/2022   FOOT EXAM  02/21/2022    There are no preventive care reminders to display for this patient.  Lab Results  Component Value Date   TSH 0.821 04/09/2021   Lab Results  Component Value Date   WBC 4.9 10/27/2021   HGB 15.1 (H) 10/27/2021   HCT 45.2 10/27/2021   MCV 94.8 10/27/2021   PLT 371 10/27/2021   Lab Results  Component Value Date   NA 144 10/27/2021   K 3.8 10/27/2021   CO2 24 10/27/2021   GLUCOSE 115 (H) 10/27/2021   BUN 9 10/27/2021   CREATININE 0.59 10/27/2021   BILITOT 0.5 10/27/2021   ALKPHOS 68 10/27/2021   AST 19 10/27/2021   ALT 26 10/27/2021   PROT 8.0 10/27/2021   ALBUMIN 3.7 10/27/2021   CALCIUM 8.8 (L) 10/27/2021   ANIONGAP 8 10/27/2021   EGFR 103 01/02/2021   Lab Results  Component Value Date   CHOL 178 11/25/2019   Lab Results  Component Value Date   HDL 37 (L) 11/25/2019   Lab Results  Component Value Date   LDLCALC 105 (H) 11/25/2019   Lab Results  Component Value Date   TRIG 209 (H) 11/25/2019   Lab Results  Component Value Date   CHOLHDL 4.8 (H) 11/25/2019   Lab Results  Component Value Date   HGBA1C 5.9 02/21/2021      Assessment & Plan:   Problem List Items Addressed This  Visit   None  No orders  of the defined types were placed in this encounter.   Follow-up:  Follow up with Dr. Joya Gaskins in 4 months. I have seen and examined this patient with the mid-level provider and agree with the above note .   Asencion Noble, MD

## 2022-05-20 ENCOUNTER — Encounter: Payer: Medicare HMO | Admitting: Critical Care Medicine

## 2022-06-11 DIAGNOSIS — R569 Unspecified convulsions: Secondary | ICD-10-CM | POA: Diagnosis not present

## 2022-06-11 DIAGNOSIS — H538 Other visual disturbances: Secondary | ICD-10-CM | POA: Diagnosis not present

## 2022-06-11 DIAGNOSIS — F4320 Adjustment disorder, unspecified: Secondary | ICD-10-CM | POA: Diagnosis not present

## 2022-06-11 DIAGNOSIS — E119 Type 2 diabetes mellitus without complications: Secondary | ICD-10-CM | POA: Diagnosis not present

## 2022-06-11 DIAGNOSIS — Z880 Allergy status to penicillin: Secondary | ICD-10-CM | POA: Diagnosis not present

## 2022-06-30 ENCOUNTER — Telehealth: Payer: Self-pay | Admitting: Critical Care Medicine

## 2022-06-30 NOTE — Telephone Encounter (Signed)
Contacted Marisa [name] to schedule their annual wellness visit. Appointment made for 07/01/2022.  Thank you,  [facility] Support [facility] Direct dial  [phone]

## 2022-07-01 ENCOUNTER — Ambulatory Visit: Payer: Medicare HMO | Attending: Critical Care Medicine

## 2022-07-01 VITALS — Ht 65.0 in | Wt 205.0 lb

## 2022-07-01 DIAGNOSIS — Z0000 Encounter for general adult medical examination without abnormal findings: Secondary | ICD-10-CM | POA: Diagnosis not present

## 2022-07-01 DIAGNOSIS — Z1231 Encounter for screening mammogram for malignant neoplasm of breast: Secondary | ICD-10-CM

## 2022-07-01 NOTE — Patient Instructions (Signed)
Marisa [name] , Thank you for taking time to come for your Medicare Wellness Visit. I appreciate your ongoing commitment to your health goals. Please review the following plan we discussed and let me know if I can assist you in the future.   These are the goals we discussed:  Goals      Manage stress in healthy ways        This is a list of the screening recommended for you and due dates:  Health Maintenance  Topic Date Due   Eye exam for diabetics  01/23/2021   Hemoglobin A1C  08/21/2021   Yearly kidney health urinalysis for diabetes  02/21/2022   Complete foot exam   02/21/2022   Flu Shot  09/18/2022   Yearly kidney function blood test for diabetes  10/28/2022   Medicare Annual Wellness Visit  07/01/2023   Colon Cancer Screening  08/02/2026   DTaP/Tdap/Td vaccine (2 - Td or Tdap) 04/09/2030   Hepatitis C Screening: USPSTF Recommendation to screen - Ages 38-79 yo.  Completed   HIV Screening  Completed   HPV Vaccine  Aged Out   Pap Smear  Discontinued   COVID-19 Vaccine  Discontinued    Advanced directives: Information on Advanced Care Planning can be found at [facility] [place] Advance Health Care Directives Advance Health Care Directives ([email or web address]    Conditions/risks identified: Aim for 30 minutes of exercise or brisk walking, 6-8 glasses of water, and 5 servings of fruits and vegetables each day.  Next appointment: Follow up in one year for your annual wellness visit.   Preventive Care 40-64 Years, Female Preventive care refers to lifestyle choices and visits with your health care provider that can promote health and wellness. What does preventive care include? A yearly physical exam. This is also called an annual well check. Dental exams once or twice a year. Routine eye exams. Ask your health care provider how often you should have your eyes checked. Personal lifestyle choices, including: Daily care of your teeth and gums. Regular physical  activity. Eating a healthy diet. Avoiding tobacco and drug use. Limiting alcohol use. Practicing safe sex. Taking low-dose aspirin daily starting at age 85. Taking vitamin and mineral supplements as recommended by your health care provider. What happens during an annual well check? The services and screenings done by your health care provider during your annual well check will depend on your age, overall health, lifestyle risk factors, and family history of disease. Counseling  Your health care provider may ask you questions about your: Alcohol use. Tobacco use. Drug use. Emotional well-being. Home and relationship well-being. Sexual activity. Eating habits. Work and work Astron[employer]. Method of birth control. Menstrual cycle. Pregnancy history. Screening  You may have the following tests or measurements: Height, weight, and BMI. Blood pressure. Lipid and cholesterol levels. These may be checked every 5 years, or more frequently if you are over 39 years old. Skin check. Lung cancer screening. You may have this screening every year starting at age 41 if you have a 30-pack-year history of smoking and currently smoke or have quit within the past 15 years. Fecal occult blood test (FOBT) of the stool. You may have this test every year starting at age 86. Flexible sigmoidoscopy or colonoscopy. You may have a sigmoidoscopy every 5 years or a colonoscopy every 10 years starting at age 57. Hepatitis C blood test. Hepatitis B blood test. Sexually transmitted disease (STD) testing. Diabetes screening. This is done by checking your blood  sugar (glucose) after you have not eaten for a while (fasting). You may have this done every 1-3 years. Mammogram. This may be done every 1-2 years. Talk to your health care provider about when you should start having regular mammograms. This may depend on whether you have a family history of breast cancer. BRCA-related cancer screening. This may be done if  you have a family history of breast, ovarian, tubal, or peritoneal cancers. Pelvic exam and Pap test. This may be done every 3 years starting at age 57. Starting at age 56, this may be done every 5 years if you have a Pap test in combination with an HPV test. Bone density scan. This is done to screen for osteoporosis. You may have this scan if you are at high risk for osteoporosis. Discuss your test results, treatment options, and if necessary, the need for more tests with your health care provider. Vaccines  Your health care provider may recommend certain vaccines, such as: Influenza vaccine. This is recommended every year. Tetanus, diphtheria, and acellular pertussis (Tdap, Td) vaccine. You may need a Td booster every 10 years. Zoster vaccine. You may need this after age 54. Pneumococcal 13-valent conjugate (PCV13) vaccine. You may need this if you have certain conditions and were not previously vaccinated. Pneumococcal polysaccharide (PPSV23) vaccine. You may need one or two doses if you smoke cigarettes or if you have certain conditions. Talk to your health care provider about which screenings and vaccines you need and how often you need them. This information is not intended to replace advice given to you by your health care provider. Make sure you discuss any questions you have with your health care provider. Document Released: 03/02/2015 Document Revised: 10/24/2015 Document Reviewed: 12/05/2014 Elsevier Interactive Patient Education  2017 [employer].    Fall Prevention in the Home Falls can cause injuries. They can happen to people of all ages. There are many things you can do to make your home safe and to help prevent falls. What can I do on the outside of my home? Regularly fix the edges of walkways and driveways and fix any cracks. Remove anything that might make you trip as you walk through a door, such as a raised step or threshold. Trim any bushes or trees on the path to your  home. Use bright outdoor lighting. Clear any walking paths of anything that might make someone trip, such as rocks or tools. Regularly check to see if handrails are loose or broken. Make sure that both sides of any steps have handrails. Any raised decks and porches should have guardrails on the edges. Have any leaves, snow, or ice cleared regularly. Use sand or salt on walking paths during winter. Clean up any spills in your garage right away. This includes oil or grease spills. What can I do in the bathroom? Use night lights. Install grab bars by the toilet and in the tub and shower. Do not use towel bars as grab bars. Use non-skid mats or decals in the tub or shower. If you need to sit down in the shower, use a plastic, non-slip stool. Keep the floor dry. Clean up any water that spills on the floor as soon as it happens. Remove soap buildup in the tub or shower regularly. Attach bath mats securely with double-sided non-slip rug tape. Do not have throw rugs and other things on the floor that can make you trip. What can I do in the bedroom? Use night lights. Make sure that you  have a light by your bed that is easy to reach. Do not use any sheets or blankets that are too big for your bed. They should not hang down onto the floor. Have a firm chair that has side arms. You can use this for support while you get dressed. Do not have throw rugs and other things on the floor that can make you trip. What can I do in the kitchen? Clean up any spills right away. Avoid walking on wet floors. Keep items that you use a lot in easy-to-reach places. If you need to reach something above you, use a strong step stool that has a grab bar. Keep electrical cords out of the way. Do not use floor polish or wax that makes floors slippery. If you must use wax, use non-skid floor wax. Do not have throw rugs and other things on the floor that can make you trip. What can I do with my stairs? Do not leave any  items on the stairs. Make sure that there are handrails on both sides of the stairs and use them. Fix handrails that are broken or loose. Make sure that handrails are as long as the stairways. Check any carpeting to make sure that it is firmly attached to the stairs. Fix any carpet that is loose or worn. Avoid having throw rugs at the top or bottom of the stairs. If you do have throw rugs, attach them to the floor with carpet tape. Make sure that you have a light switch at the top of the stairs and the bottom of the stairs. If you do not have them, ask someone to add them for you. What else can I do to help prevent falls? Wear shoes that: Do not have high heels. Have rubber bottoms. Are comfortable and fit you well. Are closed at the toe. Do not wear sandals. If you use a stepladder: Make sure that it is fully opened. Do not climb a closed stepladder. Make sure that both sides of the stepladder are locked into place. Ask someone to hold it for you, if possible. Clearly mark and make sure that you can see: Any grab bars or handrails. First and last steps. Where the edge of each step is. Use tools that help you move around (mobility aids) if they are needed. These include: Canes. Walkers. Scooters. Crutches. Turn on the lights when you go into a dark area. Replace any light bulbs as soon as they burn out. Set up your furniture so you have a clear path. Avoid moving your furniture around. If any of your floors are uneven, fix them. If there are any pets around you, be aware of where they are. Review your medicines with your doctor. Some medicines can make you feel dizzy. This can increase your chance of falling. Ask your doctor what other things that you can do to help prevent falls. This information is not intended to replace advice given to you by your health care provider. Make sure you discuss any questions you have with your health care provider. Document Released: 11/30/2008  Document Revised: 07/12/2015 Document Reviewed: 03/10/2014 Elsevier Interactive Patient Education  2017 [employer].

## 2022-07-01 NOTE — Progress Notes (Signed)
Subjective:   Marisa [name] is a 47 y.o. female who presents for an Initial Medicare Annual Wellness Visit.  I connected with  Marisa [name] on 07/01/22 by a audio enabled telemedicine application and verified that I am speaking with the correct person using two identifiers.  Patient Location: Home  Provider Location: Home Office  I discussed the limitations of evaluation and management by telemedicine. The patient expressed understanding and agreed to proceed.  Review of Systems     Cardiac Risk Factors include: sedentary lifestyle;smoking/ tobacco exposure     Objective:    Today's Vitals   07/01/22 0951  Weight: 205 lb (93 kg)  Height: 5\' 5"  (1.651 m)   Body mass index is 34.11 kg/m.     07/01/2022   12:02 PM 04/05/2021    8:55 AM 01/04/2021    8:18 PM 01/04/2021   12:17 PM 12/24/2020    4:00 PM 12/21/2020   10:17 AM 02/02/2020    2:49 AM  Advanced Directives  Does Patient Have a Medical Advance Directive? No No Yes Yes Yes Yes No  Type of Advance Directive   Living will Living will Living will Living will   Does patient want to make changes to medical advance directive? Yes (MAU/Ambulatory/Procedural Areas - Information given)  No - Patient declined No - Patient declined No - Patient declined No - Patient declined   Would patient like information on creating a medical advance directive?   No - Patient declined No - Patient declined No - Patient declined No - Patient declined No - Patient declined    Current Medications (verified) Outpatient Encounter Medications as of 07/01/2022  Medication Sig   Accu-Chek Softclix Lancets lancets Use as instructed (Patient not taking: Reported on 07/01/2022)   albuterol (PROVENTIL HFA) 108 (90 Base) MCG/ACT inhaler Inhale 2 puffs into the lungs every 6 (six) hours as needed for wheezing or shortness of breath. (Patient not taking: Reported on 07/01/2022)   albuterol (PROVENTIL) (2.5 MG/3ML) 0.083% nebulizer solution  Take 3 mLs (2.5 mg total) by nebulization every 6 (six) hours as needed for wheezing or shortness of breath. (Patient not taking: Reported on 07/01/2022)   Blood Glucose Monitoring Suppl (ACCU-CHEK GUIDE) w/Device KIT Check blood sugars daily (Patient not taking: Reported on 07/01/2022)   cetirizine (ZYRTEC) 10 MG tablet Take 1 tablet (10 mg total) by mouth daily. (Patient not taking: Reported on 07/01/2022)   clindamycin-benzoyl peroxide (BENZACLIN) gel Apply topically 2 (two) times daily. To affected area on right lower leg (Patient not taking: Reported on 07/01/2022)   EPINEPHrine 0.3 mg/0.3 mL IJ SOAJ injection Inject 0.3 mg into the muscle as needed for anaphylaxis. (Patient not taking: Reported on 07/01/2022)   gabapentin (NEURONTIN) 100 MG capsule Take 2 capsules (200 mg total) by mouth 2 (two) times daily. (Patient not taking: Reported on 07/01/2022)   glucose blood (ACCU-CHEK GUIDE) test strip Use as instructed (Patient not taking: Reported on 07/01/2022)   hydrOXYzine (VISTARIL) 25 MG capsule Take 1 capsule (25 mg total) by mouth every 8 (eight) hours as needed. (Patient not taking: Reported on 07/01/2022)   Lancets Misc. (ACCU-CHEK SOFTCLIX LANCET DEV) KIT Use to check blood sugar (Patient not taking: Reported on 07/01/2022)   ondansetron (ZOFRAN-ODT) 4 MG disintegrating tablet Dissolve 1 tablet (4 mg total) by mouth every 6 (six) hours as needed for nausea or vomiting. (Patient not taking: Reported on 07/01/2022)   pantoprazole (PROTONIX) 40 MG tablet Take 1 tablet (40 mg total) by  mouth daily. (Patient not taking: Reported on 07/01/2022)   SUMAtriptan (IMITREX) 50 MG tablet Take 1 tablet (50 mg total) by mouth every 2 (two) hours as needed for migraine. May repeat in 2 hours if headache persists or recurs. (Patient not taking: Reported on 07/01/2022)   traZODone (DESYREL) 100 MG tablet Take 1-2 tablets (100-200 mg total) by mouth at bedtime as needed for sleep. (Patient not taking: Reported on  07/01/2022)   [DISCONTINUED] azithromycin (ZITHROMAX) 250 MG tablet Take 2 tabs PO day 1, then take 1 tab PO once daily   [DISCONTINUED] benzonatate (TESSALON) 200 MG capsule Take 1 capsule (200 mg total) by mouth 2 (two) times daily as needed for cough.   No facility-administered encounter medications on file as of 07/01/2022.    Allergies (verified) Asa [aspirin], Mushroom extract complex, Penicillins, Shellfish allergy, and Triamcinolone   History: Past Medical History:  Diagnosis Date   Abdominal pain 04/26/2013   Abnormal uterine bleeding (AUB) 10/11/2012   Acute bronchitis    Allergy    Anal pain    chronic   Anemia    Anxiety    Asthma    exacerbation 02-28-2014 and 02-23-2014 secondary to Rhinovirus   Atypical chest pain 04/26/2013   Benign neoplasm of sigmoid colon    Benign neoplasm of transverse colon    Carbuncle of labium 07/12/2015   Chest pain 02/20/2014   Chronic diarrhea    Chronic headaches    Chronic low back pain    Cigarette nicotine dependence without complication 05/04/2014   Cyst of right ovary    Dandruff 03/26/2015   Diabetes mellitus without complication (HCC) 10/13/2013   Diabetic neuropathy, painful (HCC) 12/13/2019   Difficult intravenous access    PER PT NEEDS PICC LINE   Dyspnea 09/07/2012   Cleda Daub 08/2012:  No obstruction by FEV1%, but probable restriction.     Falls 03/27/2014   Food allergy    Mushrooms, shellfish   GAD (generalized anxiety disorder) 05/04/2014   Gait disturbance 04/11/2014   Gait instability    GERD (gastroesophageal reflux disease)    History of adenomatous polyp of colon    History of cardiac arrest    during SVD 1992   History of ectopic pregnancy    2009-  S/P LEFT SALPINGECTOMY   History of panic attacks    Hyperlipidemia    IBS (irritable bowel syndrome)    Insomnia 03/06/2014   Joint pain    Lower extremity edema    Lumbar stenosis L4 -- L5 with bulging disk   w/ right leg weakness/ decreased mobility    Migraine variant with headache 05/09/2014   Mild obstructive sleep apnea    study 03-20-2014  no cpap recommended   Neuromuscular disorder (HCC)    neuropathy in feet    Neuropathic pain of both legs 06/04/2016   Obesity (BMI 30-39.9) 03/05/2018   OSA (obstructive sleep apnea) 03/06/2014   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   Panniculitis 03/05/2018   Pelvic pain 07/25/2013   Persistent vomiting 04/27/2013   Pneumonia    PTSD (post-traumatic stress disorder) 05/04/2014   Rash and nonspecific skin eruption 07/12/2015   Rectal bleeding 07/25/2013   RLQ abdominal pain    S/P Total vaginal hysterectomy on 01/06/13 01/06/2013   Sleep apnea    mild no cpap   Social anxiety disorder 05/04/2014   Sore throat 02/20/2014   Stomach ulcer    Tachycardia 02/20/2014   Type 2 diabetes mellitus (HCC)    Weakness  of right leg    FROM BACK PROBLEM PER PT   Past Surgical History:  Procedure Laterality Date   ABDOMINAL HYSTERECTOMY     partial   COLONOSCOPY Left 04/29/2013   Procedure: COLONOSCOPY;  Surgeon: Willis Modena, MD;  Location: [facility];  Service: Endoscopy;  Laterality: Left;   COLONOSCOPY     COLONOSCOPY WITH PROPOFOL N/A 08/01/2016   Procedure: COLONOSCOPY WITH PROPOFOL;  Surgeon: Sherrilyn Rist, MD;  Location: [facility];  Service: Gastroenterology;  Laterality: N/A;   ECTOPIC PREGNANCY SURGERY     ESOPHAGOGASTRODUODENOSCOPY (EGD) WITH PROPOFOL N/A 11/10/2018   Procedure: ESOPHAGOGASTRODUODENOSCOPY (EGD) WITH PROPOFOL;  Surgeon: Sherrilyn Rist, MD;  Location: [facility];  Service: Gastroenterology;  Laterality: N/A;   EVALUATION UNDER ANESTHESIA WITH FISTULECTOMY N/A 04/20/2014   Procedure: EXAM UNDER ANESTHESIA ;  Surgeon: Romie Levee, MD;  Location: [facility];  Service: General;  Laterality: N/A;   FLEXIBLE SIGMOIDOSCOPY N/A 11/09/2013   Procedure: FLEXIBLE SIGMOIDOSCOPY;  Surgeon: Willis Modena, MD;  Location: [facility];   Service: Endoscopy;  Laterality: N/A;   fupa removal      LAPAROSCOPIC CHOLECYSTECTOMY  2005   LAPAROSCOPIC GASTRIC SLEEVE RESECTION N/A 12/24/2020   Procedure: LAPAROSCOPIC GASTRIC SLEEVE RESECTION;  Surgeon: Berna Bue, MD;  Location: [facility];  Service: General;  Laterality: N/A;   REFRACTIVE SURGERY     SPHINCTEROTOMY N/A 04/20/2014   Procedure:  LATERAL INTERNAL SPHINCTEROTOMY;  Surgeon: Romie Levee, MD;  Location: [facility];  Service: General;  Laterality: N/A;   TRANSTHORACIC ECHOCARDIOGRAM  12/30/2012   mild LVH/  ef 55-60%   UNILATERAL SALPINGECTOMY  2009   laparotomy left salpingectomy-- ectopic preg.   UPPER GASTROINTESTINAL ENDOSCOPY     UPPER GI ENDOSCOPY N/A 12/24/2020   Procedure: UPPER GI ENDOSCOPY;  Surgeon: Berna Bue, MD;  Location: [facility];  Service: General;  Laterality: N/A;   VAGINAL HYSTERECTOMY N/A 01/06/2013   Procedure: HYSTERECTOMY VAGINAL;  Surgeon: Tereso Newcomer, MD;  Location: [facility];  Service: Gynecology;  Laterality: N/A;   Family History  Problem Relation Age of Onset   Hypertension Mother    Diabetes Mother    Allergies Mother    Heart disease Mother    Clotting disorder Mother    Stroke Mother    Kidney disease Mother    Thyroid disease Mother    Cancer Father    Hyperlipidemia Father    Hypertension Father    Colon cancer Father    Liver disease Father    Heart disease Maternal Grandmother    Breast cancer Maternal Grandmother 79   Schizophrenia Sister    Bipolar disorder Sister    Clotting disorder Sister    Bipolar disorder Brother    Kidney disease Brother    Bipolar disorder Sister    Pancreatic cancer Maternal Aunt    Prostate cancer Maternal Uncle    Liver cancer Maternal Grandfather    Rectal cancer Maternal Grandfather    Liver cancer Paternal Grandfather    Colon polyps Neg Hx    Esophageal cancer Neg Hx    Stomach cancer Neg Hx    Social History   Socioeconomic History   Marital status:  Married    Spouse name: Not on file   Number of children: 1   Years of education: Not on file   Highest education level: Not on file  Occupational History   Occupation: disability  Tobacco Use   Smoking status: Former  Packs/day: 0.20    Years: 11.00    Additional pack years: 0.00    Total pack years: 2.20    Types: Cigarettes    Quit date: 11/17/2013    Years since quitting: 8.6   Smokeless tobacco: Never   Tobacco comments:    2 years quit  Vaping Use   Vaping Use: Never used  Substance and Sexual Activity   Alcohol use: No    Alcohol/week: 0.0 standard drinks of alcohol   Drug use: No   Sexual activity: Yes    Birth control/protection: Surgical  Other Topics Concern   Not on file  Social History Narrative   Lives with sister   Drinks no caffeine   Right Handed    Lives in a one story home    Social Determinants of Health   Financial Resource Strain: Low Risk  (07/01/2022)   Overall Financial Resource Strain (CARDIA)    Difficulty of Paying Living Expenses: Not hard at all  Food Insecurity: No Food Insecurity (07/01/2022)   Hunger Vital Sign    Worried About Running Out of Food in the Last Year: Never true    Ran Out of Food in the Last Year: Never true  Transportation Needs: No Transportation Needs (07/01/2022)   PRAPARE - Administrator, Civil Service (Medical): No    Lack of Transportation (Non-Medical): No  Physical Activity: Inactive (07/01/2022)   Exercise Vital Sign    Days of Exercise per Week: 0 days    Minutes of Exercise per Session: 0 min  Stress: No Stress Concern Present (07/01/2022)   [employer] of Occupational Health - Occupational Stress Questionnaire    Feeling of Stress : Not at all  Social Connections: Moderately Isolated (07/01/2022)   Social Connection and Isolation Panel [NHANES]    Frequency of Communication with Friends and Family: More than three times a week    Frequency of Social Gatherings with Friends and Family:  Three times a week    Attends Religious Services: Never    Active Member of Clubs or Organizations: No    Attends Engineer, structural: Never    Marital Status: Married    Tobacco Counseling Counseling given: Not Answered Tobacco comments: 2 years quit   Clinical Intake:  Pre-visit preparation completed: Yes  Pain : No/denies pain  Diabetes: Yes CBG done?: No Did pt. bring in CBG monitor from home?: No  How often do you need to have someone help you when you read instructions, pamphlets, or other written materials from your doctor or pharmacy?: 1 - Never  Diabetic?Yes   Nutrition Risk Assessment:  Has the patient had any N/V/D within the last 2 months?  No  Does the patient have any non-healing wounds?  No  Has the patient had any unintentional weight loss or weight gain?  No   Diabetes:  Is the patient diabetic?  Yes  If diabetic, was a CBG obtained today?  No  Did the patient bring in their glucometer from home?  No  How often do you monitor your CBG's? As needed.   Financial Strains and Diabetes Management:  Are you having any financial strains with the device, your supplies or your medication? No .  Does the patient want to be seen by Chronic Care Management for management of their diabetes?  No  Would the patient like to be referred to a Nutritionist or for Diabetic Management?  No   Diabetic Exams:  Diabetic Eye Exam: Completed  at [facility] Diabetic Foot Exam: Overdue, Pt has been advised about the importance in completing this exam. Pt is scheduled for diabetic foot exam on at next office visit.   Interpreter Needed?: No  Information entered by :: Kandis Fantasia LPN   Activities of Daily Living    07/01/2022    9:55 AM  In your present state of health, do you have any difficulty performing the following activities:  Hearing? 0  Vision? 0  Difficulty concentrating or making decisions? 0  Walking or climbing stairs? 0  Dressing or  bathing? 0  Doing errands, shopping? 0  Preparing Food and eating ? N  Using the Toilet? N  In the past six months, have you accidently leaked urine? N  Do you have problems with loss of bowel control? N  Managing your Medications? N  Managing your Finances? N  Housekeeping or managing your Housekeeping? N    Patient Care Team: Storm Frisk, MD as PCP - General (Pulmonary Disease) Glendale Chard, DO as Consulting Physician (Neurology)  Indicate any recent Medical Services you may have received from other than [facility] providers in the past year (date may be approximate).     Assessment:   This is a routine wellness examination for Marisa [name].  Hearing/Vision screen Hearing Screening - Comments:: Denies hearing difficulties   Vision Screening - Comments:: Wears rx glasses - up to date with routine eye exams with [facility]    Dietary issues and exercise activities discussed: Current Exercise Habits: The patient does not participate in regular exercise at present   Goals Addressed             This Visit's Progress    Manage stress in healthy ways        Depression Screen    07/01/2022   12:00 PM 02/21/2021   11:15 AM 01/02/2021   10:58 AM 06/27/2020    8:16 AM 04/09/2020    8:45 AM 01/27/2020   10:00 AM 11/25/2019    9:08 AM  PHQ 2/9 Scores  PHQ - 2 Score 0 2 0 0 1 1 2   PHQ- 9 Score  7 3  5 9 6     Fall Risk    07/01/2022    9:55 AM 04/05/2021    8:55 AM 02/21/2021    8:58 AM 01/02/2021   10:57 AM 01/27/2020   10:00 AM  Fall Risk   Falls in the past year? 1 1 1  0 0  Number falls in past yr: 1 1 0 0 0  Injury with Fall? 0 0 0 0 0  Risk for fall due to : History of fall(s)  Other (Comment) No Fall Risks   Follow up Education provided;Falls prevention discussed;Falls evaluation completed    Falls evaluation completed    FALL RISK PREVENTION PERTAINING TO THE HOME:  Any stairs in or around the home? No  If so, are there any without handrails? No  Home  free of loose throw rugs in walkways, pet beds, electrical cords, etc? Yes  Adequate lighting in your home to reduce risk of falls? Yes   ASSISTIVE DEVICES UTILIZED TO PREVENT FALLS:  Life alert? No  Use of a cane, walker or w/c? No  Grab bars in the bathroom? Yes  Shower chair or bench in shower? No  Elevated toilet seat or a handicapped toilet? Yes   TIMED UP AND GO:  Was the test performed? No . Telephonic visit   Cognitive Function:  07/01/2022   12:02 PM  6CIT Screen  What Year? 0 points  What month? 0 points  What time? 0 points  Count back from 20 0 points  Months in reverse 0 points  Repeat phrase 0 points  Total Score 0 points    Immunizations Immunization History  Administered Date(s) Administered   Influenza,inj,Quad PF,6+ Mos 02/07/2013, 03/02/2014   Moderna Sars-Covid-2 Vaccination 07/18/2019, 08/15/2019   PPD Test 12/20/2012   Tdap 04/09/2020    TDAP status: Up to date  Covid-19 vaccine status: Information provided on how to obtain vaccines.   Qualifies for Shingles Vaccine? No    Screening Tests Health Maintenance  Topic Date Due   OPHTHALMOLOGY EXAM  01/23/2021   HEMOGLOBIN A1C  08/21/2021   Diabetic kidney evaluation - Urine ACR  02/21/2022   FOOT EXAM  02/21/2022   INFLUENZA VACCINE  09/18/2022   Diabetic kidney evaluation - eGFR measurement  10/28/2022   Medicare Annual Wellness (AWV)  07/01/2023   COLONOSCOPY (Pts 45-64yrs Insurance coverage will need to be confirmed)  08/02/2026   DTaP/Tdap/Td (2 - Td or Tdap) 04/09/2030   Hepatitis C Screening  Completed   HIV Screening  Completed   HPV VACCINES  Aged Out   PAP SMEAR-Modifier  Discontinued   COVID-19 Vaccine  Discontinued    Health Maintenance  Health Maintenance Due  Topic Date Due   OPHTHALMOLOGY EXAM  01/23/2021   HEMOGLOBIN A1C  08/21/2021   Diabetic kidney evaluation - Urine ACR  02/21/2022   FOOT EXAM  02/21/2022    Colorectal cancer screening: Type of  screening: Colonoscopy. Completed 08/01/16. Repeat every 10 years  Mammogram status: Ordered today. Pt provided with contact info and advised to call to schedule appt.   Lung Cancer Screening: (Low Dose CT Chest recommended if Age 46-80 years, 30 pack-year currently smoking OR have quit w/in 15years.) does not qualify.   Lung Cancer Screening Referral: n/a  Additional Screening:  Hepatitis C Screening: does qualify; Completed 10/13/13  Vision Screening: Recommended annual ophthalmology exams for early detection of glaucoma and other disorders of the eye. Is the patient up to date with their annual eye exam?  Yes  Who is the provider or what is the name of the office in which the patient attends annual eye exams? [facility] If pt is not established with a provider, would they like to be referred to a provider to establish care? No .   Dental Screening: Recommended annual dental exams for proper oral hygiene  Community Resource Referral / Chronic Care Management: CRR required this visit?  No   CCM required this visit?  No      Plan:     I have personally reviewed and noted the following in the patient's chart:   Medical and social history Use of alcohol, tobacco or illicit drugs  Current medications and supplements including opioid prescriptions. Patient is not currently taking opioid prescriptions. Functional ability and status Nutritional status Physical activity Advanced directives List of other physicians Hospitalizations, surgeries, and ER visits in previous 12 months Vitals Screenings to include cognitive, depression, and falls Referrals and appointments  In addition, I have reviewed and discussed with patient certain preventive protocols, quality metrics, and best practice recommendations. A written personalized care plan for preventive services as well as general preventive health recommendations were provided to patient.     Durwin Nora,  [place]   1/61/0960   Due to this being a virtual visit, the after visit summary  with patients personalized plan was offered to patient via mail or my-chart.  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

## 2022-07-31 ENCOUNTER — Encounter (HOSPITAL_COMMUNITY): Payer: Self-pay | Admitting: *Deleted

## 2022-08-11 ENCOUNTER — Ambulatory Visit: Payer: Medicare HMO

## 2022-08-18 ENCOUNTER — Ambulatory Visit: Payer: Medicare HMO

## 2022-08-20 ENCOUNTER — Encounter: Payer: Medicare HMO | Admitting: Critical Care Medicine

## 2022-09-04 ENCOUNTER — Encounter: Payer: Self-pay | Admitting: Critical Care Medicine

## 2022-09-04 ENCOUNTER — Ambulatory Visit: Payer: Medicare HMO | Attending: Critical Care Medicine | Admitting: Critical Care Medicine

## 2022-09-04 VITALS — BP 123/85 | HR 80 | Ht 65.0 in | Wt 218.0 lb

## 2022-09-04 DIAGNOSIS — J029 Acute pharyngitis, unspecified: Secondary | ICD-10-CM

## 2022-09-04 DIAGNOSIS — G43809 Other migraine, not intractable, without status migrainosus: Secondary | ICD-10-CM | POA: Diagnosis not present

## 2022-09-04 DIAGNOSIS — F411 Generalized anxiety disorder: Secondary | ICD-10-CM | POA: Diagnosis not present

## 2022-09-04 DIAGNOSIS — G5601 Carpal tunnel syndrome, right upper limb: Secondary | ICD-10-CM

## 2022-09-04 DIAGNOSIS — E1140 Type 2 diabetes mellitus with diabetic neuropathy, unspecified: Secondary | ICD-10-CM

## 2022-09-04 DIAGNOSIS — E785 Hyperlipidemia, unspecified: Secondary | ICD-10-CM | POA: Diagnosis not present

## 2022-09-04 DIAGNOSIS — E1169 Type 2 diabetes mellitus with other specified complication: Secondary | ICD-10-CM

## 2022-09-04 DIAGNOSIS — J45909 Unspecified asthma, uncomplicated: Secondary | ICD-10-CM

## 2022-09-04 MED ORDER — ACCU-CHEK GUIDE VI STRP: ORAL_STRIP | 12 refills | Status: DC

## 2022-09-04 MED ORDER — ALBUTEROL SULFATE (2.5 MG/3ML) 0.083% IN NEBU: 2.5000 mg | INHALATION_SOLUTION | Freq: Four times a day (QID) | RESPIRATORY_TRACT | 1 refills | Status: AC | PRN

## 2022-09-04 MED ORDER — CETIRIZINE HCL 10 MG PO TABS
10.0000 mg | ORAL_TABLET | Freq: Every day | ORAL | 11 refills | Status: DC
Start: 2022-09-04 — End: 2022-09-14

## 2022-09-04 MED ORDER — CLINDAMYCIN PHOS-BENZOYL PEROX 1-5 % EX GEL: Freq: Two times a day (BID) | CUTANEOUS | 0 refills | Status: DC

## 2022-09-04 MED ORDER — ALBUTEROL SULFATE HFA 108 (90 BASE) MCG/ACT IN AERS: 2.0000 | INHALATION_SPRAY | Freq: Four times a day (QID) | RESPIRATORY_TRACT | 2 refills | Status: DC | PRN

## 2022-09-04 MED ORDER — ACCU-CHEK SOFTCLIX LANCET DEV KIT: PACK | 0 refills | Status: DC

## 2022-09-04 MED ORDER — SERTRALINE HCL 50 MG PO TABS: 50.0000 mg | ORAL_TABLET | Freq: Every day | ORAL | 3 refills | Status: DC

## 2022-09-04 MED ORDER — GABAPENTIN 300 MG PO CAPS: 300.0000 mg | ORAL_CAPSULE | Freq: Two times a day (BID) | ORAL | 1 refills | Status: DC

## 2022-09-04 MED ORDER — ACCU-CHEK GUIDE W/DEVICE KIT: PACK | 0 refills | Status: AC

## 2022-09-04 MED ORDER — ACCU-CHEK SOFTCLIX LANCETS MISC: 12 refills | Status: AC

## 2022-09-04 MED ORDER — EPINEPHRINE 0.3 MG/0.3ML IJ SOAJ: 0.3000 mg | INTRAMUSCULAR | 1 refills | Status: AC | PRN

## 2022-09-04 MED ORDER — SUMATRIPTAN SUCCINATE 50 MG PO TABS
50.0000 mg | ORAL_TABLET | ORAL | 0 refills | Status: DC | PRN
Start: 2022-09-04 — End: 2022-12-18

## 2022-09-04 MED ORDER — TRAZODONE HCL 100 MG PO TABS: 100.0000 mg | ORAL_TABLET | Freq: Every evening | ORAL | 1 refills | Status: DC | PRN

## 2022-09-04 NOTE — Assessment & Plan Note (Signed)
Assess hemoglobin A1c 

## 2022-09-04 NOTE — Assessment & Plan Note (Signed)
Renew inhaler

## 2022-09-04 NOTE — Patient Instructions (Signed)
Refills on all medications Start sertraline one daily for anxiety Increase gabapentin to 300mg  twice daily New blood sugar testing issued Labs today Return 2 months

## 2022-09-04 NOTE — Progress Notes (Signed)
Established Patient Office Visit  Subjective:  Patient ID: Marisa [name], female    DOB: 1975-10-11  Age: 47 y.o. MRN: [id number]  CC:  Chief Complaint  Patient presents with   Annual Exam    HPI 02/2021 Marisa [name] is a 47 y.o. female who presents for follow up visit today. She has noticed some worsening of her neuropathy recently, with pain in her right leg and foot. She notes that her carpal tunnel syndrome in her right hand has worsened over the past six months as well which concerns her. She takes Gabapentin for this which helps. Despite these symptoms, her diabetes appears markedly improved, with her A1C at today's visit at 5.4% and her point of care blood sugar at 104 mg/dL. She is not taking any medications for diabetes at this point in time.   Of note, [name] had a gastrectomy sleeve bariatric surgery procedure on 12/24/2020. She states she is doing well at this point post-operatively and she has followed up with her surgeon and nutritionist. She continues to take ondansetron for nausea. She says she mostly needs this when she is taking the vitamin supplements that were given to her after surgery, as the vitamins make her nauseous. She would like refills on that today. She has also had some issues with constipation since her surgery. She has a very hard bowel movement every other day and she has to strain. Her bowel movements are painful and are accompanied by rectal bleeding. She says she is drinking lots of fluids and has been taking Miralax mixed in water for this.  [name]'s migraine headaches have not improved since she was last seen in office. She continues to have bad headaches about every other day, which she says are very painful and require her to lie down. Topomax was prescribed for this condition, but does not seem to improve her symptoms at all.   She states that she has been much more anxious lately. She says that the Trazadone helps her with sleep, but  she would like something else to help her manage her anxiety better.  [name] is also concerned about a 1 cm lung nodule that was found incidentally on CT on 01/04/2021 during a post op ED visit for nausea and chest pain  CT Angio was done to r/o PE and nodule found incidentally.  She is a former smoker and has had exposure to secondhand smoke. She is due for another CT scan of the lung at this time.    09/04/22 Patient is seen last visit was January of last year the patient has had increased anxiety and nervousness precipitated by the loss of her mother recently.  The mother was in hospice she is receiving grief counseling from hospice.  She does have asthma with no exacerbations.  She has migraines and needs refills on migraine medication.  She does have a history of diabetes not on medication.  Last A1c was normal.  She has been treating this with diet alone.  She does have elevated cholesterol controlled with diet alone Past Medical History:  Diagnosis Date   Abdominal pain 04/26/2013   Abnormal uterine bleeding (AUB) 10/11/2012   Acute bronchitis    Allergy    Anal pain    chronic   Anemia    Anxiety    Asthma    exacerbation 02-28-2014 and 02-23-2014 secondary to Rhinovirus   Atypical chest pain 04/26/2013   Benign neoplasm of sigmoid colon    Benign neoplasm of transverse  colon    Carbuncle of labium 07/12/2015   Chest pain 02/20/2014   Chronic diarrhea    Chronic headaches    Chronic low back pain    Cigarette nicotine dependence without complication 05/04/2014   Cyst of right ovary    Dandruff 03/26/2015   Diabetes mellitus without complication (HCC) 10/13/2013   Diabetic neuropathy, painful (HCC) 12/13/2019   Difficult intravenous access    PER PT NEEDS PICC LINE   Dyspnea 09/07/2012   Cleda Daub 08/2012:  No obstruction by FEV1%, but probable restriction.     Falls 03/27/2014   Food allergy    Mushrooms, shellfish   GAD (generalized anxiety disorder) 05/04/2014   Gait  disturbance 04/11/2014   Gait instability    GERD (gastroesophageal reflux disease)    History of adenomatous polyp of colon    History of cardiac arrest    during SVD 1992   History of ectopic pregnancy    2009-  S/P LEFT SALPINGECTOMY   History of panic attacks    Hyperlipidemia    IBS (irritable bowel syndrome)    Insomnia 03/06/2014   Joint pain    Lower extremity edema    Lumbar stenosis L4 -- L5 with bulging disk   w/ right leg weakness/ decreased mobility   Migraine variant with headache 05/09/2014   Mild obstructive sleep apnea    study 03-20-2014  no cpap recommended   Neuromuscular disorder (HCC)    neuropathy in feet    Neuropathic pain of both legs 06/04/2016   Obesity (BMI 30-39.9) 03/05/2018   OSA (obstructive sleep apnea) 03/06/2014   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   Panniculitis 03/05/2018   Pelvic pain 07/25/2013   Persistent vomiting 04/27/2013   Pneumonia    PTSD (post-traumatic stress disorder) 05/04/2014   Rash and nonspecific skin eruption 07/12/2015   Rectal bleeding 07/25/2013   RLQ abdominal pain    S/P Total vaginal hysterectomy on 01/06/13 01/06/2013   Sleep apnea    mild no cpap   Social anxiety disorder 05/04/2014   Sore throat 02/20/2014   Stomach ulcer    Tachycardia 02/20/2014   Type 2 diabetes mellitus (HCC)    Weakness of right leg    FROM BACK PROBLEM PER PT    Past Surgical History:  Procedure Laterality Date   ABDOMINAL HYSTERECTOMY     partial   COLONOSCOPY Left 04/29/2013   Procedure: COLONOSCOPY;  Surgeon: Willis Modena, MD;  Location: [facility];  Service: Endoscopy;  Laterality: Left;   COLONOSCOPY     COLONOSCOPY WITH PROPOFOL N/A 08/01/2016   Procedure: COLONOSCOPY WITH PROPOFOL;  Surgeon: Sherrilyn Rist, MD;  Location: [facility];  Service: Gastroenterology;  Laterality: N/A;   ECTOPIC PREGNANCY SURGERY     ESOPHAGOGASTRODUODENOSCOPY (EGD) WITH PROPOFOL N/A 11/10/2018   Procedure:  ESOPHAGOGASTRODUODENOSCOPY (EGD) WITH PROPOFOL;  Surgeon: Sherrilyn Rist, MD;  Location: [facility];  Service: Gastroenterology;  Laterality: N/A;   EVALUATION UNDER ANESTHESIA WITH FISTULECTOMY N/A 04/20/2014   Procedure: EXAM UNDER ANESTHESIA ;  Surgeon: Romie Levee, MD;  Location: [facility];  Service: General;  Laterality: N/A;   FLEXIBLE SIGMOIDOSCOPY N/A 11/09/2013   Procedure: FLEXIBLE SIGMOIDOSCOPY;  Surgeon: Willis Modena, MD;  Location: [facility];  Service: Endoscopy;  Laterality: N/A;   fupa removal      LAPAROSCOPIC CHOLECYSTECTOMY  2005   LAPAROSCOPIC GASTRIC SLEEVE RESECTION N/A 12/24/2020   Procedure: LAPAROSCOPIC GASTRIC SLEEVE RESECTION;  Surgeon: Berna Bue, MD;  Location: [facility];  Service: General;  Laterality: N/A;   REFRACTIVE SURGERY     SPHINCTEROTOMY N/A 04/20/2014   Procedure:  LATERAL INTERNAL SPHINCTEROTOMY;  Surgeon: Romie Levee, MD;  Location: [facility];  Service: General;  Laterality: N/A;   TRANSTHORACIC ECHOCARDIOGRAM  12/30/2012   mild LVH/  ef 55-60%   UNILATERAL SALPINGECTOMY  2009   laparotomy left salpingectomy-- ectopic preg.   UPPER GASTROINTESTINAL ENDOSCOPY     UPPER GI ENDOSCOPY N/A 12/24/2020   Procedure: UPPER GI ENDOSCOPY;  Surgeon: Berna Bue, MD;  Location: [facility];  Service: General;  Laterality: N/A;   VAGINAL HYSTERECTOMY N/A 01/06/2013   Procedure: HYSTERECTOMY VAGINAL;  Surgeon: Tereso Newcomer, MD;  Location: [facility];  Service: Gynecology;  Laterality: N/A;    Family History  Problem Relation Age of Onset   Hypertension Mother    Diabetes Mother    Allergies Mother    Heart disease Mother    Clotting disorder Mother    Stroke Mother    Kidney disease Mother    Thyroid disease Mother    Cancer Father    Hyperlipidemia Father    Hypertension Father    Colon cancer Father    Liver disease Father    Heart disease Maternal Grandmother    Breast cancer Maternal Grandmother  41   Schizophrenia Sister    Bipolar disorder Sister    Clotting disorder Sister    Bipolar disorder Brother    Kidney disease Brother    Bipolar disorder Sister    Pancreatic cancer Maternal Aunt    Prostate cancer Maternal Uncle    Liver cancer Maternal Grandfather    Rectal cancer Maternal Grandfather    Liver cancer Paternal Grandfather    Colon polyps Neg Hx    Esophageal cancer Neg Hx    Stomach cancer Neg Hx     Social History   Socioeconomic History   Marital status: Married    Spouse name: Not on file   Number of children: 1   Years of education: Not on file   Highest education level: Not on file  Occupational History   Occupation: disability  Tobacco Use   Smoking status: Former    Current packs/day: 0.00    Average packs/day: 0.2 packs/day for 11.0 years (2.2 ttl pk-yrs)    Types: Cigarettes    Start date: 11/18/2002    Quit date: 11/17/2013    Years since quitting: 8.8   Smokeless tobacco: Never   Tobacco comments:    2 years quit  Vaping Use   Vaping status: Never Used  Substance and Sexual Activity   Alcohol use: No    Alcohol/week: 0.0 standard drinks of alcohol   Drug use: No   Sexual activity: Yes    Birth control/protection: Surgical  Other Topics Concern   Not on file  Social History Narrative   Lives with sister   Drinks no caffeine   Right Handed    Lives in a one story home    Social Determinants of Health   Financial Resource Strain: Low Risk  (07/01/2022)   Overall Financial Resource Strain (CARDIA)    Difficulty of Paying Living Expenses: Not hard at all  Food Insecurity: No Food Insecurity (07/01/2022)   Hunger Vital Sign    Worried About Running Out of Food in the Last Year: Never true    Ran Out of Food in the Last Year: Never true  Transportation Needs: No Transportation Needs (07/01/2022)  PRAPARE - Administrator, Civil Service (Medical): No    Lack of Transportation (Non-Medical): No  Physical Activity: Inactive  (07/01/2022)   Exercise Vital Sign    Days of Exercise per Week: 0 days    Minutes of Exercise per Session: 0 min  Stress: No Stress Concern Present (07/01/2022)   [employer] of Occupational Health - Occupational Stress Questionnaire    Feeling of Stress : Not at all  Social Connections: Moderately Isolated (07/01/2022)   Social Connection and Isolation Panel [NHANES]    Frequency of Communication with Friends and Family: More than three times a week    Frequency of Social Gatherings with Friends and Family: Three times a week    Attends Religious Services: Never    Active Member of Clubs or Organizations: No    Attends Banker Meetings: Never    Marital Status: Married  Catering manager Violence: Not At Risk (07/01/2022)   Humiliation, Afraid, Rape, and Kick questionnaire    Fear of Current or Ex-Partner: No    Emotionally Abused: No    Physically Abused: No    Sexually Abused: No    Outpatient Medications Prior to Visit  Medication Sig Dispense Refill   Accu-Chek Softclix Lancets lancets Use as instructed (Patient not taking: Reported on 07/01/2022) 100 each 12   albuterol (PROVENTIL HFA) 108 (90 Base) MCG/ACT inhaler Inhale 2 puffs into the lungs every 6 (six) hours as needed for wheezing or shortness of breath. (Patient not taking: Reported on 07/01/2022) 6.7 g 2   albuterol (PROVENTIL) (2.5 MG/3ML) 0.083% nebulizer solution Take 3 mLs (2.5 mg total) by nebulization every 6 (six) hours as needed for wheezing or shortness of breath. (Patient not taking: Reported on 07/01/2022) 150 mL 1   Blood Glucose Monitoring Suppl (ACCU-CHEK GUIDE) w/Device KIT Check blood sugars daily (Patient not taking: Reported on 07/01/2022) 1 kit 0   cetirizine (ZYRTEC) 10 MG tablet Take 1 tablet (10 mg total) by mouth daily. (Patient not taking: Reported on 07/01/2022) 30 tablet 11   clindamycin-benzoyl peroxide (BENZACLIN) gel Apply topically 2 (two) times daily. To affected area on right lower  leg (Patient not taking: Reported on 07/01/2022) 25 g 0   EPINEPHrine 0.3 mg/0.3 mL IJ SOAJ injection Inject 0.3 mg into the muscle as needed for anaphylaxis. (Patient not taking: Reported on 07/01/2022) 1 each 1   gabapentin (NEURONTIN) 100 MG capsule Take 2 capsules (200 mg total) by mouth 2 (two) times daily. (Patient not taking: Reported on 07/01/2022) 120 capsule 3   glucose blood (ACCU-CHEK GUIDE) test strip Use as instructed (Patient not taking: Reported on 07/01/2022) 100 each 12   hydrOXYzine (VISTARIL) 25 MG capsule Take 1 capsule (25 mg total) by mouth every 8 (eight) hours as needed. (Patient not taking: Reported on 07/01/2022) 60 capsule 1   Lancets Misc. (ACCU-CHEK SOFTCLIX LANCET DEV) KIT Use to check blood sugar (Patient not taking: Reported on 07/01/2022) 1 kit 0   ondansetron (ZOFRAN-ODT) 4 MG disintegrating tablet Dissolve 1 tablet (4 mg total) by mouth every 6 (six) hours as needed for nausea or vomiting. (Patient not taking: Reported on 07/01/2022) 20 tablet 1   pantoprazole (PROTONIX) 40 MG tablet Take 1 tablet (40 mg total) by mouth daily. (Patient not taking: Reported on 07/01/2022) 90 tablet 2   SUMAtriptan (IMITREX) 50 MG tablet Take 1 tablet (50 mg total) by mouth every 2 (two) hours as needed for migraine. May repeat in 2 hours if headache  persists or recurs. (Patient not taking: Reported on 07/01/2022) 20 tablet 0   traZODone (DESYREL) 100 MG tablet Take 1-2 tablets (100-200 mg total) by mouth at bedtime as needed for sleep. (Patient not taking: Reported on 07/01/2022) 60 tablet 1   No facility-administered medications prior to visit.    Allergies  Allergen Reactions   Asa [Aspirin] Anaphylaxis and Hives    Hives, chest tightness    Mushroom Extract Complex Anaphylaxis, Swelling and Other (See Comments)    Reaction:  Eye swelling   Penicillins Anaphylaxis and Other (See Comments)    Has patient had a PCN reaction causing immediate rash, facial/tongue/throat swelling, SOB or  lightheadedness with hypotension: Yes Has patient had a PCN reaction causing severe rash involving mucus membranes or skin necrosis: No Has patient had a PCN reaction that required hospitalization No Has patient had a PCN reaction occurring within the last 10 years: No If all of the above answers are "NO", then may proceed with Cephalosporin use.   Shellfish Allergy Anaphylaxis   Triamcinolone Other (See Comments)    Skin issues     ROS Review of Systems  Constitutional:  Negative for fatigue and fever.  HENT: Negative.    Eyes:  Negative for pain.  Respiratory:  Negative for chest tightness and shortness of breath (inhaler helps, steam helps).   Cardiovascular:  Negative for chest pain.  Gastrointestinal:  Negative for anal bleeding, constipation, nausea and rectal pain.  Endocrine: Negative for cold intolerance and polyuria.  Genitourinary: Negative.   Musculoskeletal:  Negative for myalgias.  Skin:  Positive for rash.  Neurological:  Positive for headaches (worsening migraines). Negative for weakness and numbness (right finger; as well as leg pain).  Psychiatric/Behavioral:  Negative for suicidal ideas. The patient is nervous/anxious.       Objective:    Physical Exam Vitals reviewed.  Constitutional:      Appearance: Normal appearance. She is obese.  HENT:     Head: Normocephalic and atraumatic.  Cardiovascular:     Rate and Rhythm: Normal rate and regular rhythm.     Pulses:          Dorsalis pedis pulses are 2+ on the right side and 2+ on the left side.       Posterior tibial pulses are 2+ on the right side and 2+ on the left side.     Heart sounds: No murmur heard.    No friction rub. No gallop.  Pulmonary:     Effort: Pulmonary effort is normal.     Breath sounds: Normal breath sounds. No wheezing or rhonchi.  Musculoskeletal:     Right lower leg: No edema.     Left lower leg: No edema.  Feet:     Right foot:     Skin integrity: Skin integrity normal.      Left foot:     Skin integrity: Skin integrity normal.  Skin:    General: Skin is warm and dry.     Findings: Rash (mild follicular type rash on right lower leg) present.  Neurological:     General: No focal deficit present.     Mental Status: She is alert. Mental status is at baseline.  Psychiatric:        Attention and Perception: Attention normal.        Mood and Affect: Mood normal.        Speech: Speech normal.        Behavior: Behavior normal.  Thought Content: Thought content normal.        Cognition and Memory: Cognition normal.     BP 123/85 (BP Location: Left Arm, Patient Position: Sitting, Cuff Size: Normal)   Pulse 80   Ht 5\' 5"  (1.651 m)   Wt 218 lb (98.9 kg)   LMP 11/27/2012   SpO2 96%   BMI 36.28 kg/m  Wt Readings from Last 3 Encounters:  09/04/22 218 lb (98.9 kg)  07/01/22 205 lb (93 kg)  05/29/21 205 lb 4.8 oz (93.1 kg)     Health Maintenance Due  Topic Date Due   OPHTHALMOLOGY EXAM  01/23/2021   HEMOGLOBIN A1C  08/21/2021   Diabetic kidney evaluation - Urine ACR  02/21/2022   FOOT EXAM  02/21/2022    There are no preventive care reminders to display for this patient.  Lab Results  Component Value Date   TSH 0.821 04/09/2021   Lab Results  Component Value Date   WBC 4.9 10/27/2021   HGB 15.1 (H) 10/27/2021   HCT 45.2 10/27/2021   MCV 94.8 10/27/2021   PLT 371 10/27/2021   Lab Results  Component Value Date   NA 144 10/27/2021   K 3.8 10/27/2021   CO2 24 10/27/2021   GLUCOSE 115 (H) 10/27/2021   BUN 9 10/27/2021   CREATININE 0.59 10/27/2021   BILITOT 0.5 10/27/2021   ALKPHOS 68 10/27/2021   AST 19 10/27/2021   ALT 26 10/27/2021   PROT 8.0 10/27/2021   ALBUMIN 3.7 10/27/2021   CALCIUM 8.8 (L) 10/27/2021   ANIONGAP 8 10/27/2021   EGFR 103 01/02/2021   Lab Results  Component Value Date   CHOL 178 11/25/2019   Lab Results  Component Value Date   HDL 37 (L) 11/25/2019   Lab Results  Component Value Date   LDLCALC 105  (H) 11/25/2019   Lab Results  Component Value Date   TRIG 209 (H) 11/25/2019   Lab Results  Component Value Date   CHOLHDL 4.8 (H) 11/25/2019   Lab Results  Component Value Date   HGBA1C 5.9 02/21/2021      Assessment & Plan:   Problem List Items Addressed This Visit       Cardiovascular and Mediastinum   Migraine variant with headache    Renew Imitrex      Relevant Medications   traZODone (DESYREL) 100 MG tablet   SUMAtriptan (IMITREX) 50 MG tablet   gabapentin (NEURONTIN) 300 MG capsule   EPINEPHrine 0.3 mg/0.3 mL IJ SOAJ injection   sertraline (ZOLOFT) 50 MG tablet     Respiratory   Intrinsic asthma    Renew inhaler      Relevant Medications   albuterol (PROVENTIL) (2.5 MG/3ML) 0.083% nebulizer solution   albuterol (PROVENTIL HFA) 108 (90 Base) MCG/ACT inhaler     Endocrine   Type 2 diabetes mellitus with diabetic neuropathy, without long-term current use of insulin (HCC) - Primary    Assess hemoglobin A1c      Relevant Orders   Hemoglobin A1c   Comprehensive metabolic panel   Lipid panel   Hyperlipidemia associated with type 2 diabetes mellitus (HCC)    Check lipids      Relevant Medications   EPINEPHrine 0.3 mg/0.3 mL IJ SOAJ injection   Other Relevant Orders   Lipid panel     Nervous and Auditory   Carpal tunnel syndrome of right wrist   Relevant Medications   traZODone (DESYREL) 100 MG tablet   gabapentin (NEURONTIN) 300 MG capsule  sertraline (ZOLOFT) 50 MG tablet     Other   GAD (generalized anxiety disorder)    Trial of sertraline      Relevant Medications   traZODone (DESYREL) 100 MG tablet   sertraline (ZOLOFT) 50 MG tablet   Other Visit Diagnoses     Acute pharyngitis, unspecified etiology       Relevant Medications   cetirizine (ZYRTEC) 10 MG tablet        Meds ordered this encounter  Medications   traZODone (DESYREL) 100 MG tablet    Sig: Take 1-2 tablets (100-200 mg total) by mouth at bedtime as needed for  sleep.    Dispense:  60 tablet    Refill:  1   SUMAtriptan (IMITREX) 50 MG tablet    Sig: Take 1 tablet (50 mg total) by mouth every 2 (two) hours as needed for migraine. May repeat in 2 hours if headache persists or recurs.    Dispense:  20 tablet    Refill:  0   Lancets Misc. (ACCU-CHEK SOFTCLIX LANCET DEV) KIT    Sig: Use to check blood sugar    Dispense:  1 kit    Refill:  0   glucose blood (ACCU-CHEK GUIDE) test strip    Sig: Use as instructed    Dispense:  100 each    Refill:  12   gabapentin (NEURONTIN) 300 MG capsule    Sig: Take 1 capsule (300 mg total) by mouth 2 (two) times daily.    Dispense:  180 capsule    Refill:  1   EPINEPHrine 0.3 mg/0.3 mL IJ SOAJ injection    Sig: Inject 0.3 mg into the muscle as needed for anaphylaxis.    Dispense:  1 each    Refill:  1   clindamycin-benzoyl peroxide (BENZACLIN) gel    Sig: Apply topically 2 (two) times daily. To affected area on right lower leg    Dispense:  25 g    Refill:  0   cetirizine (ZYRTEC) 10 MG tablet    Sig: Take 1 tablet (10 mg total) by mouth daily.    Dispense:  30 tablet    Refill:  11   Blood Glucose Monitoring Suppl (ACCU-CHEK GUIDE) w/Device KIT    Sig: Check blood sugars daily    Dispense:  1 kit    Refill:  0   albuterol (PROVENTIL) (2.5 MG/3ML) 0.083% nebulizer solution    Sig: Take 3 mLs (2.5 mg total) by nebulization every 6 (six) hours as needed for wheezing or shortness of breath.    Dispense:  150 mL    Refill:  1   albuterol (PROVENTIL HFA) 108 (90 Base) MCG/ACT inhaler    Sig: Inhale 2 puffs into the lungs every 6 (six) hours as needed for wheezing or shortness of breath.    Dispense:  6.7 g    Refill:  2   Accu-Chek Softclix Lancets lancets    Sig: Use as instructed    Dispense:  100 each    Refill:  12    Dispense device as well   sertraline (ZOLOFT) 50 MG tablet    Sig: Take 1 tablet (50 mg total) by mouth daily.    Dispense:  30 tablet    Refill:  3    Follow-up:  Follow up  with Dr. Delford Field in 4 months. I have seen and examined this patient with the mid-level provider and agree with the above note .   Shan Levans, MD

## 2022-09-04 NOTE — Assessment & Plan Note (Signed)
Trial of sertraline  

## 2022-09-04 NOTE — Assessment & Plan Note (Signed)
Check lipids 

## 2022-09-04 NOTE — Assessment & Plan Note (Signed)
Renew Imitrex.

## 2022-09-05 LAB — COMPREHENSIVE METABOLIC PANEL
ALT: 94 IU/L — ABNORMAL HIGH (ref 0–32)
AST: 99 IU/L — ABNORMAL HIGH (ref 0–40)
Albumin: 4 g/dL (ref 3.9–4.9)
Alkaline Phosphatase: 85 IU/L (ref 44–121)
BUN/Creatinine Ratio: 11 (ref 9–23)
BUN: 8 mg/dL (ref 6–24)
Bilirubin Total: 0.4 mg/dL (ref 0.0–1.2)
CO2: 23 mmol/L (ref 20–29)
Calcium: 8.6 mg/dL — ABNORMAL LOW (ref 8.7–10.2)
Chloride: 100 mmol/L (ref 96–106)
Creatinine, Ser: 0.72 mg/dL (ref 0.57–1.00)
Globulin, Total: 3 g/dL (ref 1.5–4.5)
Glucose: 98 mg/dL (ref 70–99)
Potassium: 3.7 mmol/L (ref 3.5–5.2)
Sodium: 141 mmol/L (ref 134–144)
Total Protein: 7 g/dL (ref 6.0–8.5)
eGFR: 103 mL/min/{1.73_m2} (ref >59)

## 2022-09-05 LAB — LIPID PANEL
Chol/HDL Ratio: 3.9 ratio (ref 0.0–4.4)
Cholesterol, Total: 183 mg/dL (ref 100–199)
HDL: 47 mg/dL (ref >39)
LDL Chol Calc (NIH): 91 mg/dL (ref 0–99)
Triglycerides: 271 mg/dL — ABNORMAL HIGH (ref 0–149)
VLDL Cholesterol Cal: 45 mg/dL — ABNORMAL HIGH (ref 5–40)

## 2022-09-05 LAB — HEMOGLOBIN A1C
Est. average glucose Bld gHb Est-mCnc: 128 mg/dL
Hgb A1c MFr Bld: 6.1 % — ABNORMAL HIGH (ref 4.8–5.6)

## 2022-09-05 NOTE — Progress Notes (Signed)
Let pt know all labs normal. Vloos sugar normal

## 2022-09-08 ENCOUNTER — Telehealth: Payer: Self-pay

## 2022-09-08 NOTE — Telephone Encounter (Signed)
Pt given lab results per notes of Dr. Delford Field on 09/08/22. Pt verbalized understanding.

## 2022-09-14 ENCOUNTER — Emergency Department (HOSPITAL_COMMUNITY): Payer: Medicare HMO

## 2022-09-14 ENCOUNTER — Inpatient Hospital Stay (HOSPITAL_COMMUNITY)
Admission: EM | Admit: 2022-09-14 | Discharge: 2022-09-18 | DRG: 391 | Disposition: A | Discharge status: Home / Self Care | Payer: Medicare HMO | Attending: Family Medicine | Admitting: Family Medicine

## 2022-09-14 ENCOUNTER — Encounter (HOSPITAL_COMMUNITY): Payer: Self-pay

## 2022-09-14 ENCOUNTER — Other Ambulatory Visit: Payer: Self-pay

## 2022-09-14 DIAGNOSIS — K5731 Diverticulosis of large intestine without perforation or abscess with bleeding: Secondary | ICD-10-CM | POA: Diagnosis not present

## 2022-09-14 DIAGNOSIS — E1140 Type 2 diabetes mellitus with diabetic neuropathy, unspecified: Secondary | ICD-10-CM | POA: Diagnosis not present

## 2022-09-14 DIAGNOSIS — Z833 Family history of diabetes mellitus: Secondary | ICD-10-CM

## 2022-09-14 DIAGNOSIS — Z823 Family history of stroke: Secondary | ICD-10-CM

## 2022-09-14 DIAGNOSIS — Z87891 Personal history of nicotine dependence: Secondary | ICD-10-CM | POA: Diagnosis not present

## 2022-09-14 DIAGNOSIS — R7881 Bacteremia: Secondary | ICD-10-CM | POA: Diagnosis not present

## 2022-09-14 DIAGNOSIS — D519 Vitamin B12 deficiency anemia, unspecified: Secondary | ICD-10-CM | POA: Diagnosis present

## 2022-09-14 DIAGNOSIS — R079 Chest pain, unspecified: Secondary | ICD-10-CM | POA: Diagnosis not present

## 2022-09-14 DIAGNOSIS — G43809 Other migraine, not intractable, without status migrainosus: Secondary | ICD-10-CM | POA: Diagnosis present

## 2022-09-14 DIAGNOSIS — F4310 Post-traumatic stress disorder, unspecified: Secondary | ICD-10-CM | POA: Diagnosis present

## 2022-09-14 DIAGNOSIS — Z880 Allergy status to penicillin: Secondary | ICD-10-CM

## 2022-09-14 DIAGNOSIS — K5730 Diverticulosis of large intestine without perforation or abscess without bleeding: Secondary | ICD-10-CM | POA: Diagnosis not present

## 2022-09-14 DIAGNOSIS — Z86010 Personal history of colonic polyps: Secondary | ICD-10-CM

## 2022-09-14 DIAGNOSIS — E872 Acidosis, unspecified: Secondary | ICD-10-CM | POA: Diagnosis not present

## 2022-09-14 DIAGNOSIS — R7401 Elevation of levels of liver transaminase levels: Secondary | ICD-10-CM | POA: Diagnosis present

## 2022-09-14 DIAGNOSIS — Z818 Family history of other mental and behavioral disorders: Secondary | ICD-10-CM

## 2022-09-14 DIAGNOSIS — Z6834 Body mass index (BMI) 34.0-34.9, adult: Secondary | ICD-10-CM

## 2022-09-14 DIAGNOSIS — Z9109 Other allergy status, other than to drugs and biological substances: Secondary | ICD-10-CM

## 2022-09-14 DIAGNOSIS — E876 Hypokalemia: Secondary | ICD-10-CM | POA: Diagnosis not present

## 2022-09-14 DIAGNOSIS — K920 Hematemesis: Secondary | ICD-10-CM

## 2022-09-14 DIAGNOSIS — R9431 Abnormal electrocardiogram [ECG] [EKG]: Secondary | ICD-10-CM | POA: Diagnosis not present

## 2022-09-14 DIAGNOSIS — Z8349 Family history of other endocrine, nutritional and metabolic diseases: Secondary | ICD-10-CM

## 2022-09-14 DIAGNOSIS — Z803 Family history of malignant neoplasm of breast: Secondary | ICD-10-CM

## 2022-09-14 DIAGNOSIS — E669 Obesity, unspecified: Secondary | ICD-10-CM | POA: Diagnosis not present

## 2022-09-14 DIAGNOSIS — Z9884 Bariatric surgery status: Secondary | ICD-10-CM

## 2022-09-14 DIAGNOSIS — K529 Noninfective gastroenteritis and colitis, unspecified: Secondary | ICD-10-CM | POA: Diagnosis not present

## 2022-09-14 DIAGNOSIS — G43909 Migraine, unspecified, not intractable, without status migrainosus: Secondary | ICD-10-CM | POA: Diagnosis present

## 2022-09-14 DIAGNOSIS — J45909 Unspecified asthma, uncomplicated: Secondary | ICD-10-CM | POA: Diagnosis present

## 2022-09-14 DIAGNOSIS — Z8674 Personal history of sudden cardiac arrest: Secondary | ICD-10-CM

## 2022-09-14 DIAGNOSIS — Z886 Allergy status to analgesic agent status: Secondary | ICD-10-CM | POA: Diagnosis not present

## 2022-09-14 DIAGNOSIS — Z888 Allergy status to other drugs, medicaments and biological substances status: Secondary | ICD-10-CM | POA: Diagnosis not present

## 2022-09-14 DIAGNOSIS — F4001 Agoraphobia with panic disorder: Secondary | ICD-10-CM | POA: Diagnosis present

## 2022-09-14 DIAGNOSIS — K922 Gastrointestinal hemorrhage, unspecified: Secondary | ICD-10-CM | POA: Diagnosis not present

## 2022-09-14 DIAGNOSIS — B955 Unspecified streptococcus as the cause of diseases classified elsewhere: Secondary | ICD-10-CM | POA: Diagnosis present

## 2022-09-14 DIAGNOSIS — R718 Other abnormality of red blood cells: Secondary | ICD-10-CM | POA: Insufficient documentation

## 2022-09-14 DIAGNOSIS — K625 Hemorrhage of anus and rectum: Secondary | ICD-10-CM | POA: Diagnosis present

## 2022-09-14 DIAGNOSIS — R1032 Left lower quadrant pain: Secondary | ICD-10-CM | POA: Diagnosis not present

## 2022-09-14 DIAGNOSIS — Z90710 Acquired absence of both cervix and uterus: Secondary | ICD-10-CM

## 2022-09-14 DIAGNOSIS — K921 Melena: Secondary | ICD-10-CM | POA: Diagnosis present

## 2022-09-14 DIAGNOSIS — E785 Hyperlipidemia, unspecified: Secondary | ICD-10-CM | POA: Diagnosis present

## 2022-09-14 DIAGNOSIS — Z83438 Family history of other disorder of lipoprotein metabolism and other lipidemia: Secondary | ICD-10-CM

## 2022-09-14 DIAGNOSIS — Z832 Family history of diseases of the blood and blood-forming organs and certain disorders involving the immune mechanism: Secondary | ICD-10-CM

## 2022-09-14 DIAGNOSIS — Z841 Family history of disorders of kidney and ureter: Secondary | ICD-10-CM

## 2022-09-14 DIAGNOSIS — N898 Other specified noninflammatory disorders of vagina: Secondary | ICD-10-CM | POA: Diagnosis present

## 2022-09-14 DIAGNOSIS — Z8249 Family history of ischemic heart disease and other diseases of the circulatory system: Secondary | ICD-10-CM

## 2022-09-14 DIAGNOSIS — Z91013 Allergy to seafood: Secondary | ICD-10-CM

## 2022-09-14 DIAGNOSIS — K760 Fatty (change of) liver, not elsewhere classified: Secondary | ICD-10-CM | POA: Diagnosis not present

## 2022-09-14 DIAGNOSIS — Z800 Family history of malignant neoplasm of digestive organs: Secondary | ICD-10-CM

## 2022-09-14 DIAGNOSIS — R748 Abnormal levels of other serum enzymes: Secondary | ICD-10-CM | POA: Insufficient documentation

## 2022-09-14 DIAGNOSIS — R109 Unspecified abdominal pain: Secondary | ICD-10-CM

## 2022-09-14 DIAGNOSIS — F411 Generalized anxiety disorder: Secondary | ICD-10-CM | POA: Diagnosis present

## 2022-09-14 DIAGNOSIS — Z8711 Personal history of peptic ulcer disease: Secondary | ICD-10-CM

## 2022-09-14 DIAGNOSIS — R112 Nausea with vomiting, unspecified: Secondary | ICD-10-CM | POA: Insufficient documentation

## 2022-09-14 DIAGNOSIS — Z79899 Other long term (current) drug therapy: Secondary | ICD-10-CM

## 2022-09-14 LAB — COMPREHENSIVE METABOLIC PANEL
ALT: 163 U/L — ABNORMAL HIGH (ref 0–44)
AST: 143 U/L — ABNORMAL HIGH (ref 15–41)
Albumin: 3.6 g/dL (ref 3.5–5.0)
Alkaline Phosphatase: 71 U/L (ref 38–126)
Anion gap: 17 — ABNORMAL HIGH (ref 5–15)
BUN: <5 mg/dL — ABNORMAL LOW (ref 6–20)
CO2: 20 mmol/L — ABNORMAL LOW (ref 22–32)
Calcium: 8.6 mg/dL — ABNORMAL LOW (ref 8.9–10.3)
Chloride: 106 mmol/L (ref 98–111)
Creatinine, Ser: 0.54 mg/dL (ref 0.44–1.00)
GFR, Estimated: >60 mL/min (ref >60)
Glucose, Bld: 78 mg/dL (ref 70–99)
Potassium: 3.4 mmol/L — ABNORMAL LOW (ref 3.5–5.1)
Sodium: 143 mmol/L (ref 135–145)
Total Bilirubin: 0.4 mg/dL (ref 0.3–1.2)
Total Protein: 8.1 g/dL (ref 6.5–8.1)

## 2022-09-14 LAB — PROTIME-INR
INR: 1 (ref 0.8–1.2)
Prothrombin Time: 13.6 seconds (ref 11.4–15.2)

## 2022-09-14 LAB — I-STAT CG4 LACTIC ACID, ED
Lactic Acid, Venous: 3.3 mmol/L (ref 0.5–1.9)
Lactic Acid, Venous: 3.9 mmol/L (ref 0.5–1.9)

## 2022-09-14 LAB — URINALYSIS, ROUTINE W REFLEX MICROSCOPIC
Bilirubin Urine: NEGATIVE
Glucose, UA: NEGATIVE mg/dL
Ketones, ur: 20 mg/dL — AB
Nitrite: NEGATIVE
Protein, ur: NEGATIVE mg/dL
Specific Gravity, Urine: 1.006 (ref 1.005–1.030)
pH: 6 (ref 5.0–8.0)

## 2022-09-14 LAB — POC OCCULT BLOOD, ED: Fecal Occult Bld: POSITIVE — AB

## 2022-09-14 LAB — TYPE AND SCREEN
ABO/RH(D): O POS
Antibody Screen: NEGATIVE

## 2022-09-14 LAB — CBC
HCT: 43.1 % (ref 36.0–46.0)
Hemoglobin: 14.2 g/dL (ref 12.0–15.0)
MCH: 33.6 pg (ref 26.0–34.0)
MCHC: 32.9 g/dL (ref 30.0–36.0)
MCV: 101.9 fL — ABNORMAL HIGH (ref 80.0–100.0)
Platelets: 492 10*3/uL — ABNORMAL HIGH (ref 150–400)
RBC: 4.23 MIL/uL (ref 3.87–5.11)
RDW: 12.4 % (ref 11.5–15.5)
WBC: 8.6 10*3/uL (ref 4.0–10.5)
nRBC: 0 % (ref 0.0–0.2)

## 2022-09-14 LAB — LIPASE, BLOOD: Lipase: 40 U/L (ref 11–51)

## 2022-09-14 LAB — APTT: aPTT: 27 seconds (ref 24–36)

## 2022-09-14 MED ORDER — ONDANSETRON HCL 4 MG/2ML IJ SOLN
4.0000 mg | Freq: Four times a day (QID) | INTRAMUSCULAR | Status: DC | PRN
  Administered 2022-09-15 – 2022-09-16 (×2): 4 mg via INTRAVENOUS
  Filled 2022-09-14 (×2): qty 2

## 2022-09-14 MED ORDER — CIPROFLOXACIN IN D5W 400 MG/200ML IV SOLN
400.0000 mg | Freq: Once | INTRAVENOUS | Status: AC
  Administered 2022-09-14: 400 mg via INTRAVENOUS
  Filled 2022-09-14: qty 200

## 2022-09-14 MED ORDER — PANTOPRAZOLE SODIUM 40 MG IV SOLR
40.0000 mg | Freq: Once | INTRAVENOUS | Status: AC
  Administered 2022-09-14: 40 mg via INTRAVENOUS
  Filled 2022-09-14: qty 10

## 2022-09-14 MED ORDER — ACETAMINOPHEN 325 MG PO TABS
650.0000 mg | ORAL_TABLET | Freq: Four times a day (QID) | ORAL | Status: DC
  Administered 2022-09-14 – 2022-09-16 (×7): 650 mg via ORAL
  Filled 2022-09-14 (×6): qty 2

## 2022-09-14 MED ORDER — IOHEXOL 350 MG/ML SOLN
75.0000 mL | Freq: Once | INTRAVENOUS | Status: AC | PRN
  Administered 2022-09-14: 75 mL via INTRAVENOUS

## 2022-09-14 MED ORDER — SODIUM CHLORIDE 0.9 % IV BOLUS
1000.0000 mL | Freq: Once | INTRAVENOUS | Status: AC
  Administered 2022-09-14: 1000 mL via INTRAVENOUS

## 2022-09-14 MED ORDER — METRONIDAZOLE 500 MG PO TABS
500.0000 mg | ORAL_TABLET | Freq: Two times a day (BID) | ORAL | Status: DC
  Administered 2022-09-15: 500 mg via ORAL
  Filled 2022-09-14: qty 1

## 2022-09-14 MED ORDER — ONDANSETRON HCL 4 MG PO TABS: 4.0000 mg | ORAL_TABLET | Freq: Four times a day (QID) | ORAL | Status: DC | PRN

## 2022-09-14 MED ORDER — ALBUTEROL SULFATE (2.5 MG/3ML) 0.083% IN NEBU: 2.5000 mg | INHALATION_SOLUTION | Freq: Four times a day (QID) | RESPIRATORY_TRACT | Status: DC | PRN

## 2022-09-14 MED ORDER — GABAPENTIN 300 MG PO CAPS
300.0000 mg | ORAL_CAPSULE | Freq: Two times a day (BID) | ORAL | Status: DC
  Administered 2022-09-14 – 2022-09-18 (×8): 300 mg via ORAL
  Filled 2022-09-14 (×8): qty 1

## 2022-09-14 MED ORDER — MORPHINE SULFATE (PF) 4 MG/ML IV SOLN
4.0000 mg | Freq: Once | INTRAVENOUS | Status: AC
  Administered 2022-09-14: 4 mg via INTRAVENOUS
  Filled 2022-09-14: qty 1

## 2022-09-14 MED ORDER — ACETAMINOPHEN 325 MG PO TABS
ORAL_TABLET | ORAL | Status: AC
  Administered 2022-09-14: 650 mg via ORAL
  Filled 2022-09-14: qty 2

## 2022-09-14 MED ORDER — CIPROFLOXACIN HCL 500 MG PO TABS
500.0000 mg | ORAL_TABLET | Freq: Two times a day (BID) | ORAL | Status: DC
  Administered 2022-09-15: 500 mg via ORAL
  Filled 2022-09-14: qty 1

## 2022-09-14 MED ORDER — OXYCODONE HCL 5 MG PO TABS
5.0000 mg | ORAL_TABLET | Freq: Four times a day (QID) | ORAL | Status: DC | PRN
  Administered 2022-09-14: 5 mg via ORAL
  Filled 2022-09-14: qty 1

## 2022-09-14 MED ORDER — LACTATED RINGERS IV SOLN: INTRAVENOUS | Status: DC

## 2022-09-14 MED ORDER — METRONIDAZOLE 500 MG/100ML IV SOLN
500.0000 mg | Freq: Once | INTRAVENOUS | Status: DC
  Administered 2022-09-14: 500 mg via INTRAVENOUS
  Filled 2022-09-14: qty 100

## 2022-09-14 MED ORDER — SODIUM CHLORIDE 0.9 % IV BOLUS
500.0000 mL | Freq: Once | INTRAVENOUS | Status: AC
  Administered 2022-09-14: 500 mL via INTRAVENOUS

## 2022-09-14 MED ORDER — ONDANSETRON HCL 4 MG/2ML IJ SOLN
4.0000 mg | Freq: Once | INTRAMUSCULAR | Status: AC
  Administered 2022-09-14: 4 mg via INTRAVENOUS
  Filled 2022-09-14: qty 2

## 2022-09-14 MED ORDER — HYDROMORPHONE HCL 1 MG/ML IJ SOLN
0.5000 mg | Freq: Once | INTRAMUSCULAR | Status: AC
  Administered 2022-09-14: 0.5 mg via INTRAVENOUS
  Filled 2022-09-14: qty 1

## 2022-09-14 MED ORDER — HYDROMORPHONE HCL 1 MG/ML IJ SOLN
1.0000 mg | Freq: Once | INTRAMUSCULAR | Status: AC
  Administered 2022-09-14: 1 mg via INTRAVENOUS
  Filled 2022-09-14: qty 1

## 2022-09-14 MED ORDER — POTASSIUM CHLORIDE CRYS ER 20 MEQ PO TBCR
40.0000 meq | EXTENDED_RELEASE_TABLET | Freq: Once | ORAL | Status: AC
  Administered 2022-09-14: 40 meq via ORAL
  Filled 2022-09-14: qty 2

## 2022-09-14 MED ORDER — SERTRALINE HCL 25 MG PO TABS
50.0000 mg | ORAL_TABLET | Freq: Every day | ORAL | Status: DC
  Administered 2022-09-15 – 2022-09-18 (×4): 50 mg via ORAL
  Filled 2022-09-14 (×4): qty 2

## 2022-09-14 NOTE — Progress Notes (Signed)
FMTS Brief Progress Note  S: Went to assess patient at bedside with Dr. Mliss Sax.  Patient states that she has abdominal pain that did not respond well to the Tylenol.  Denies any further episodes of rectal bleeding or bowel movements.  She states that she has not been having any additional vomiting but she has been feeling nauseous.  The antiemetics have been helping with that. Feels the pain in abdomen is a stabbing pain.   O: BP 112/78   Pulse 83   Temp 98 F (36.7 C)   Resp 17   Ht 5\' 5"  (1.651 m)   Wt 95 kg   LMP 11/27/2012   SpO2 99%   BMI 34.85 kg/m    Gen: moderate distress from pain, awake, alert, responsive to questions Resp: CTAB, no iWOB on RA CV: RRR Abd: Soft, mild diffuse tenderness to palpation, moderate tenderness in right abdomen, normoactive bowel sounds  A/P: Lactic acidosis 2.46>3.3>3.9. Thought to be related to vomiting/volume depletion. CT abdomen with nonspecific colitis.  She is in a good amount of pain.  I will add on oxycodone for as needed.  I will await her next lactic acid and if remains elevated alongside continued abdominal pain I will consider obtaining a CT angio of her abdomen to rule out mesenteric ischemia.  Was given 1.5 L bolus in ED.  Currently on LR at 100 mL an hour. - Increase LR to maintenance of 135 mL/hr-monitor fluids status - await midnight labs  Rectal bleeding No further episodes here. -Monitor on 12 AM CBC - Orders reviewed. Labs for AM ordered, which was adjusted as needed.    Levin Erp, MD 09/14/2022, 10:09 PM PGY-3, [facility] Night Resident  Please page [phone] with questions.

## 2022-09-14 NOTE — Assessment & Plan Note (Addendum)
Patient reports abdominal pain, hematemesis, hematochezia over the past several days.  FOBT positive in ED.  Hemodynamically stable, hemoglobin 14.2 and platelets 492.  Not on anticoagulation and reports infrequent use of NSAIDs.  GI was consulted in the ED and does not plan to scope at this time due to patient's hemodynamic stability and no active brisk bleed, recommended sooner outpatient follow-up with [facility]. - Monitor CBC - Reconsult GI if Hgb drops or signs of active bleed

## 2022-09-14 NOTE — ED Triage Notes (Signed)
Pt arrived POV from home c/o generalized abdominal pain and rectal bleeding that started today. Pt is also c/o N/V.

## 2022-09-14 NOTE — H&P (Cosign Needed Addendum)
Hospital Admission History and Physical Service Pager: [phone]  Patient name: Marisa [name] Medical record number: [id number] Date of Birth: October 12, 1975 Age: 47 y.o. Gender: female  Primary Care Provider: Storm Frisk, MD Consultants: GI Code Status: Full code which was confirmed with family if patient unable to confirm   Preferred Emergency Contact:  Contact Information     Name Relation Home Work Mobile   Terry,Jerome Spouse   [phone]   Baltazar Najjar [phone]  [phone]      Other Contacts   None on File     Chief Complaint: Lactic Acidosis 2/2 to N/V/D   Assessment and Plan: Marisa [name] is a 47 y.o. female with past medical history of T2DM, migraine disorder, GAD, PTSD, and IBS presenting with hematemesis and hematochezia for the last 2 days. Differential for presentation of this includes Lactic acidosis secondary to N/V/D, GI bleed, colitis, gastritis, diverticulitis, and pancreatitis.   Likely lactic acidosis secondary to nausea/vomiting/diarrhea over the past few days. Patient is hemodynamically stable, however lactic acid elevated from 3.3 to 3.9 and mild transaminitis in the ED.  She also had a positive FOBT but no gross bleeding on rectal exam. CT AP was obtained which showed signs of possible colitis infection.  GI was consulted and agreed that there is no need for intervention at this time due to her hemodynamic stability, normal Hgb, and low concern for GIB.  However, due to her lactic acid the ED was concerned about sepsis.  Patient was started on Ciprofloxacin and Flagyl IV in the ED and given 1.5 L fluid.   -      Hospital     * (Principal) Lactic acidosis     Lactic acid elevated to 3.3 and trended up to 3.9 likely secondary to  nausea, vomiting, diarrhea over the past couple days.  S/p 1.5 L NS. Chest  x-ray showed no acute cardiopulmonary abnormality.  CT abdomen pelvis with  contrast shows nonspecific colitis, colonic  diverticulosis with no acute  diverticulitis, hepatic steatosis, and aortic atherosclerosis. - Admit to [facility], attending Dr. Manson Passey - Med-Surg, Vital signs per floor - Regular diet  - VTE prophylaxis: SCDs, d/t potential GI bleed - Fluids: LR 100 mL/hr - s/p IV Cipro and Flagyl per EDP 7/28 - Transition to PO Ciprofloxacin 500 mg BID and Flagyl 500 mg BID for 7  day course total (7/29-8/3) - Pain regimen: Tylenol - AM CBC/BMP/trend Lactic acid         Rectal bleeding     Patient reports abdominal pain, hematemesis, hematochezia over the past  several days.  FOBT positive in ED.  Hemodynamically stable, hemoglobin  14.2 and platelets 492.  Not on anticoagulation and reports infrequent use  of NSAIDs.  GI was consulted in the ED and does not plan to scope at this  time due to patient's hemodynamic stability and no active brisk bleed,  recommended sooner outpatient follow-up with [facility]. - Monitor CBC - Reconsult GI if Hgb drops or signs of active bleed        Elevated liver enzymes     AST 163 and ALT 143.  Patient reports that she drinks 2 to 3 glasses of  wine every other day.  No history of alcohol withdrawal with tremors or  seizures. - Monitor CMP - Hepatitis panel pending        Elevated MCV     MCV 101.9.  Considering vitamin B-12 deficiency due to history of  alcohol use and could be contributing to her bleeding.  - Vit B12 level pending        Hypokalemia     K+ at 3.4 on admission likely due to GI losses. - Replete with 40 meq PO - Repeat CMP at [facility]      Chronic Stable Conditions GAD/PTSD: Continue Zoloft 50 mg Nausea/Vomiting: Has improved since admission. Zofran 4 mg q6h PRN Asthma: Continue Albuterol 2.5 mg q6h PRN  FEN/GI: regular diet, as tolerated  VTE Prophylaxis: SCDs, possible GI bleed   Disposition: med-surg   History of Present Illness:  Marisa [name] is a 47 y.o. female presenting with LLQ and suprapubic abdominal pain,  hematemesis, and hematochezia.  Reports a fever of 102 at home.  Has a history of GI bleeds as recently as 2-3 months ago.  She states that she had a colonoscopy which showed multiple polyps, which were removed, which could not be confirmed.  Not on anticoagulation.  Uses NSAIDs infrequently.  Patient also shares that she drinks 2-3 glasses of wine every other day.  Endorses having dark, melenotic stools with clots, some watery diarrhea, nausea and vomiting blood at home which she treated with Zofran, occasional headaches which she uses Imitrex for.  Also complains of some shortness of breath from her asthma, not on oxygen and saturating well on room air.  Denies blurry vision, chest pain, palpitations, and leg pain.  In the ED, patient presented with hematemesis, hematochezia for the past 2 days. Patient reported to be in some distress secondary to abdominal pain.  FOBT positive, no gross bleeding, small external non-thrombosed hemorrhoid, minimal brown stool in rectal vault, and no anal fissures noted on rectal exam by EDP.  Labs demonstrated lactic acid 3.3>3.9, hemoglobin stable at 14.2, MCV 101.9, Platelets 492, Transaminitis with AST 143 and ALT 163, Potassium 3.4, and calcium 8.6.  Chest x-ray showed no acute cardiopulmonary abnormality.  CT abdomen pelvis with contrast shows nonspecific colitis, colonic diverticulosis with no acute diverticulitis, hepatic steatosis, and aortic atherosclerosis.  Patient received 1.5 L of fluid in the ED and was started on Ciprofloxacin and Flagyl. GI was consulted and does not plan to scope at this time due to patient being stable and will follow up outpatient.  Review Of Systems: Per HPI with the following additions: as above  Pertinent Past Medical History: T2DM diet controlled Migraines GAD PTSD IBS Remainder reviewed in history tab.   Pertinent Past Surgical History: Abdominal Hysterectomy Vaginal Hysterectomy Flex Sig 2015 Colonscopy 2015, 2018,   EGD 2020, 2022 Gastric Sleeve Resection 2022 Sphincterotomy 2016 Remainder reviewed in history tab.   Pertinent Social History: Tobacco use: Former, quit 15 years ago  Alcohol use: 2-3 glasses of wine every other day; never WD from alcohol  Other Substance use: denies  Lives with husband, daughter 55 years old, patient's sister   Pertinent Family History: Mother - HTN, Diabetes, Heart disease, Clotting disorder, Stroke Father - Cancer, HLD, HTN, Liver disease Sister - Schizophrenia, Bipolar Disorder, Clotting Disorder Sister - Bipolar Disorder Remainder reviewed in history tab.   Important Outpatient Medications: Albuterol Gabapentin 300 BID Imitrex 50 mg Zoloft 50 mg Remainder reviewed in medication history.   Objective: BP 116/75   Pulse 83   Temp 97.6 F (36.4 C)   Resp 14   Ht 5\' 5"  (1.651 m)   Wt 95.3 kg   LMP 11/27/2012   SpO2 94%   BMI 34.95 kg/m  Exam: General: pleasant woman in NAD, awake and  alert Eyes: wear glasses Cardiovascular: RRR. No M/R/G. Respiratory: CTAB. No crackles, wheezing, or rhonchi Gastrointestinal: soft, mild diffuse tenderness, non-distended. Normoactive bowel sounds MSK: no BLE edema Derm: warm and dry Neuro: AAOx3. No focal neurological deficits Psych: normal mood  Labs:  CBC BMET  Recent Labs  Lab 09/14/22 1051  WBC 8.6  HGB 14.2  HCT 43.1  PLT 492*   Recent Labs  Lab 09/14/22 1051  NA 143  K 3.4*  CL 106  CO2 20*  BUN <5*  CREATININE 0.54  GLUCOSE 78  CALCIUM 8.6*     Lactic acid: 3.3>3.9 FOBT + MCV: 101.9 PT/INR: 13.6/1.0 AST/ALT: 163/143  EKG: Rate 73, NSR  Imaging Studies Performed: CXR No acute cardiopulmonary abnormality.  CT Abd/Pelvis w/o contrast:  1. Long segment mild colonic wall thickening of the descending and sigmoid colon, compatible with a nonspecific colitis. 2. Colonic diverticulosis without evidence of acute diverticulitis. 3. Hepatic steatosis. 4. Aortic atherosclerosis  (ICD10-I70.0).  Fortunato Curling, DO 09/14/2022, 6:17 PM PGY-1, [facility]  I have reviewed the above note and made the appropriate changes.   Glendale Chard, DO [facility], PGY-2 09/14/22 6:24 PM    FPTS Intern pager: [phone], text pages welcome Secure chat group [facility] Teaching Service

## 2022-09-14 NOTE — ED Provider Notes (Signed)
[place] EMERGENCY DEPARTMENT AT [facility] Provider Note   CSN: [id number] Arrival date & time: 09/14/22  7253     History  Chief Complaint  Patient presents with   Rectal Bleeding   Abdominal Pain    Marisa [name] is a 47 y.o. female with past medical history diabetes, migraine disorder, GAD, PTSD, IBS who presents to the ED complaining of rectal bleeding.  She states that for the last 2 days she has had persistent, gross melena with hematochezia.  She has associated left lower quadrant and suprapubic abdominal pain that she says is severe.  Says at home she had an episode of a fever of 102 Fahrenheit yesterday.  Patient has has had a history of previous GI bleeds including as recently as 2 to 3 months ago.  She states that for the episode she was evaluated by GI and was found to have multiple polyps which were removed.  She is scheduled to follow-up with them next month.  She does not take any anticoagulants.  States that she does consume alcohol but not heavily or frequently.  None recently.  States that she was previously followed by [facility] who is completed her colonoscopies.     Home Medications Prior to Admission medications   Medication Sig Start Date End Date Taking? Authorizing Provider  albuterol (PROVENTIL HFA) 108 (90 Base) MCG/ACT inhaler Inhale 2 puffs into the lungs every 6 (six) hours as needed for wheezing or shortness of breath. 09/04/22  Yes Storm Frisk, MD  albuterol (PROVENTIL) (2.5 MG/3ML) 0.083% nebulizer solution Take 3 mLs (2.5 mg total) by nebulization every 6 (six) hours as needed for wheezing or shortness of breath. 09/04/22  Yes Storm Frisk, MD  EPINEPHrine 0.3 mg/0.3 mL IJ SOAJ injection Inject 0.3 mg into the muscle as needed for anaphylaxis. 09/04/22  Yes Storm Frisk, MD  gabapentin (NEURONTIN) 300 MG capsule Take 1 capsule (300 mg total) by mouth 2 (two) times daily. 09/04/22  Yes Storm Frisk, MD  sertraline  (ZOLOFT) 50 MG tablet Take 1 tablet (50 mg total) by mouth daily. 09/04/22  Yes Storm Frisk, MD  SUMAtriptan (IMITREX) 50 MG tablet Take 1 tablet (50 mg total) by mouth every 2 (two) hours as needed for migraine. May repeat in 2 hours if headache persists or recurs. 09/04/22  Yes Storm Frisk, MD  Accu-Chek Softclix Lancets lancets Use as instructed 09/04/22   Storm Frisk, MD  Blood Glucose Monitoring Suppl (ACCU-CHEK GUIDE) w/Device KIT Check blood sugars daily 09/04/22   Storm Frisk, MD  glucose blood (ACCU-CHEK GUIDE) test strip Use as instructed 09/04/22   Storm Frisk, MD  Lancets Misc. (ACCU-CHEK SOFTCLIX LANCET DEV) KIT Use to check blood sugar 09/04/22   Storm Frisk, MD      Allergies    Asa [aspirin], Mushroom extract complex, Penicillins, Shellfish allergy, and Triamcinolone    Review of Systems   Review of Systems  All other systems reviewed and are negative.   Physical Exam Updated Vital Signs BP 104/76   Pulse 83   Temp 97.6 F (36.4 C)   Resp 12   Ht 5\' 5"  (1.651 m)   Wt 95.3 kg   LMP 11/27/2012   SpO2 94%   BMI 34.95 kg/m  Physical Exam Vitals and nursing note reviewed. Exam conducted with a chaperone present.  Constitutional:      General: She is in acute distress (moderate secondary to pain).  Appearance: Normal appearance.  HENT:     Head: Normocephalic and atraumatic.     Mouth/Throat:     Mouth: Mucous membranes are moist.  Eyes:     Conjunctiva/sclera: Conjunctivae normal.  Cardiovascular:     Rate and Rhythm: Normal rate and regular rhythm.     Heart sounds: No murmur heard. Pulmonary:     Effort: Pulmonary effort is normal.     Breath sounds: Normal breath sounds.  Abdominal:     General: Abdomen is flat. There is no distension.     Palpations: Abdomen is soft.     Tenderness: There is generalized abdominal tenderness. There is no right CVA tenderness, left CVA tenderness, guarding or rebound.  Genitourinary:     Comments: Rectal exam completed with Shawn, primary RN, at bedside, no gross bleeding, small external nonthrombosed hemorrhoid, minimal brown stool in rectal vault, no anal fissure or other abnormality identified Musculoskeletal:        General: Normal range of motion.     Cervical back: Neck supple.     Right lower leg: No edema.     Left lower leg: No edema.  Skin:    General: Skin is warm and dry.     Capillary Refill: Capillary refill takes less than 2 seconds.  Neurological:     Mental Status: She is alert. Mental status is at baseline.  Psychiatric:        Behavior: Behavior normal.     ED Results / Procedures / Treatments   Labs (all labs ordered are listed, but only abnormal results are displayed) Labs Reviewed  COMPREHENSIVE METABOLIC PANEL - Abnormal; Notable for the following components:      Result Value   Potassium 3.4 (*)    CO2 20 (*)    BUN <5 (*)    Calcium 8.6 (*)    AST 143 (*)    ALT 163 (*)    Anion gap 17 (*)    All other components within normal limits  CBC - Abnormal; Notable for the following components:   MCV 101.9 (*)    Platelets 492 (*)    All other components within normal limits  URINALYSIS, ROUTINE W REFLEX MICROSCOPIC - Abnormal; Notable for the following components:   Hgb urine dipstick MODERATE (*)    Ketones, ur 20 (*)    Leukocytes,Ua TRACE (*)    Bacteria, UA RARE (*)    All other components within normal limits  POC OCCULT BLOOD, ED - Abnormal; Notable for the following components:   Fecal Occult Bld POSITIVE (*)    All other components within normal limits  I-STAT CG4 LACTIC ACID, ED - Abnormal; Notable for the following components:   Lactic Acid, Venous 3.3 (*)    All other components within normal limits  CULTURE, BLOOD (ROUTINE X 2)  CULTURE, BLOOD (ROUTINE X 2)  LIPASE, BLOOD  PROTIME-INR  APTT  HEMOGLOBIN AND HEMATOCRIT, BLOOD  I-STAT CG4 LACTIC ACID, ED  I-STAT CG4 LACTIC ACID, ED  TYPE AND SCREEN    EKG EKG  Interpretation Date/Time:  Sunday September 14 2022 13:46:32 EDT Ventricular Rate:  73 PR Interval:  151 QRS Duration:  78 QT Interval:  410 QTC Calculation: 452 R Axis:   -25  Text Interpretation: Sinus rhythm Borderline left axis deviation Low voltage, precordial leads Abnormal R-wave progression, early transition Borderline T abnormalities, diffuse leads No significant change since last tracing Confirmed by Fulton Reek [phone]) on 09/14/2022 1:48:56 PM  Radiology CT ABDOMEN  PELVIS W CONTRAST  Result Date: 09/14/2022 CLINICAL DATA:  Left lower quadrant abdominal pain EXAM: CT ABDOMEN AND PELVIS WITH CONTRAST TECHNIQUE: Multidetector CT imaging of the abdomen and pelvis was performed using the standard protocol following bolus administration of intravenous contrast. RADIATION DOSE REDUCTION: This exam was performed according to the departmental dose-optimization program which includes automated exposure control, adjustment of the mA and/or kV according to patient size and/or use of iterative reconstruction technique. CONTRAST:  75mL OMNIPAQUE IOHEXOL 350 MG/ML SOLN COMPARISON:  01/04/2021 FINDINGS: Lower chest: No acute abnormality. Hepatobiliary: Diffusely decreased attenuation of the hepatic parenchyma. No focal liver lesion is identified. Status post cholecystectomy. No biliary dilatation. Pancreas: Unremarkable. No pancreatic ductal dilatation or surrounding inflammatory changes. Spleen: Normal in size without focal abnormality. Adrenals/Urinary Tract: Unremarkable adrenal glands. Kidneys enhance symmetrically without focal lesion, stone, or hydronephrosis. Ureters are nondilated. Urinary bladder appears unremarkable for the degree of distention. Stomach/Bowel: Postsurgical changes of sleeve gastrectomy. The previously seen fluid collection adjacent to the suture line of the proximal stomach has resolved. No dilated loops of bowel. Normal appendix in the right lower quadrant. Long segment mild  colonic wall thickening of the descending and sigmoid colon. Scattered colonic diverticula. No focally inflamed diverticulum is identified. Vascular/Lymphatic: Scattered aortoiliac atherosclerotic calcifications without aneurysm. No abdominopelvic lymphadenopathy. Reproductive: Status post hysterectomy. No adnexal masses. Other: No free fluid. No abdominopelvic fluid collection. No pneumoperitoneum. No abdominal wall hernia. Musculoskeletal: No acute or significant osseous findings. IMPRESSION: 1. Long segment mild colonic wall thickening of the descending and sigmoid colon, compatible with a nonspecific colitis. 2. Colonic diverticulosis without evidence of acute diverticulitis. 3. Hepatic steatosis. 4. Aortic atherosclerosis (ICD10-I70.0). Electronically Signed   By: Duanne Guess D.O.   On: 09/14/2022 14:34   DG Chest Portable 1 View  Result Date: 09/14/2022 CLINICAL DATA:  gi bleed EXAM: PORTABLE CHEST 1 VIEW COMPARISON:  January 04, 2021 FINDINGS: The cardiomediastinal silhouette is unchanged in contour. No pleural effusion. No pneumothorax. No acute pleuroparenchymal abnormality. IMPRESSION: No acute cardiopulmonary abnormality. Electronically Signed   By: Meda Klinefelter M.D.   On: 09/14/2022 13:42    Procedures Procedures    Medications Ordered in ED Medications  metroNIDAZOLE (FLAGYL) IVPB 500 mg (has no administration in time range)  ciprofloxacin (CIPRO) IVPB 400 mg (400 mg Intravenous New Bag/Given 09/14/22 1534)  HYDROmorphone (DILAUDID) injection 0.5 mg (has no administration in time range)  pantoprazole (PROTONIX) injection 40 mg (40 mg Intravenous Given 09/14/22 1323)  morphine (PF) 4 MG/ML injection 4 mg (4 mg Intravenous Given 09/14/22 1322)  ondansetron (ZOFRAN) injection 4 mg (4 mg Intravenous Given 09/14/22 1322)  sodium chloride 0.9 % bolus 1,000 mL (1,000 mLs Intravenous New Bag/Given 09/14/22 1323)  iohexol (OMNIPAQUE) 350 MG/ML injection 75 mL (75 mLs Intravenous  Contrast Given 09/14/22 1413)  sodium chloride 0.9 % bolus 500 mL (0 mLs Intravenous Stopped 09/14/22 1539)  potassium chloride SA (KLOR-CON M) CR tablet 40 mEq (40 mEq Oral Given 09/14/22 1439)  HYDROmorphone (DILAUDID) injection 1 mg (1 mg Intravenous Given 09/14/22 1428)    ED Course/ Medical Decision Making/ A&P Clinical Course as of 09/14/22 1618  Sun Sep 14, 2022  1607 FMTS.   If patient looking better after she gets antibiotics she can go home.  [CG]  1608 No bleeding, lactic coming down, toleratig PO, she can go home.  [CG]    Clinical Course User Index [CG] Al Decant, PA-C  Medical Decision Making Amount and/or Complexity of Data Reviewed Labs: ordered. Decision-making details documented in ED Course. Radiology: ordered. Decision-making details documented in ED Course. ECG/medicine tests: ordered. Decision-making details documented in ED Course.  Risk Prescription drug management. Decision regarding hospitalization.   Medical Decision Making:   KEYONDA MCNANEY is a 47 y.o. female who presented to the ED today with abdominal pain detailed above.    Patient's presentation is complicated by their history of multiple comorbidities.  Complete initial physical exam performed, notably the patient  was in moderate distress secondary to pain.  She has generalized abdominal tenderness but abdomen was soft, nondistended.  On rectal exam, she had dark brown stool in the rectal vault but there was minimal stool present no gross bleeding.    Reviewed and confirmed nursing documentation for past medical history, family history, social history.    Initial Assessment:   With the patient's presentation of abdominal pain, differential diagnosis includes but is not limited to AAA, mesenteric ischemia, appendicitis, diverticulitis, DKA, gastritis, gastroenteritis, AMI, nephrolithiasis, pancreatitis, peritonitis, adrenal insufficiency, intestinal  ischemia, constipation, UTI, SBO/LBO, splenic rupture, biliary disease, IBD, IBS, PUD, hepatitis, STD, ovarian/testicular torsion, electrolyte disturbance, DKA, dehydration, acute kidney injury, renal failure, cholecystitis, cholelithiasis, choledocholithiasis, abdominal pain of  unknown etiology, upper versus lower GI bleed.  Initial Plan:  Screening labs including CBC and Metabolic panel to evaluate for infectious or metabolic etiology of disease.  Lipase to evaluate for pancreatitis Urinalysis with reflex culture ordered to evaluate for UTI or relevant urologic/nephrologic pathology.  CT abd/pelvis to evaluate for intra-abdominal pathology EKG to evaluate for cardiac pathology Lactate to assess for severe infection/sepsis Coags to assess for bleeding disorder Repeat hemoglobin to assess for active bleeding/transfusion need Symptomatic management Objective evaluation as reviewed   Initial Study Results:   Laboratory  All laboratory results reviewed without evidence of clinically relevant pathology.   Exceptions include: K3.4, BUN less than 5, calcium 8.6, anion gap 17, AST 143, ALT 163, lactic 3.3, platelets 492, Hemoccult positive  EKG EKG was reviewed independently. ST segments without concerns for elevations.   EKG: unchanged from previous tracings, normal sinus rhythm.   Radiology:  All images reviewed independently. Agree with radiology report at this time.   CT ABDOMEN PELVIS W CONTRAST  Result Date: 09/14/2022 CLINICAL DATA:  Left lower quadrant abdominal pain EXAM: CT ABDOMEN AND PELVIS WITH CONTRAST TECHNIQUE: Multidetector CT imaging of the abdomen and pelvis was performed using the standard protocol following bolus administration of intravenous contrast. RADIATION DOSE REDUCTION: This exam was performed according to the departmental dose-optimization program which includes automated exposure control, adjustment of the mA and/or kV according to patient size and/or use of  iterative reconstruction technique. CONTRAST:  75mL OMNIPAQUE IOHEXOL 350 MG/ML SOLN COMPARISON:  01/04/2021 FINDINGS: Lower chest: No acute abnormality. Hepatobiliary: Diffusely decreased attenuation of the hepatic parenchyma. No focal liver lesion is identified. Status post cholecystectomy. No biliary dilatation. Pancreas: Unremarkable. No pancreatic ductal dilatation or surrounding inflammatory changes. Spleen: Normal in size without focal abnormality. Adrenals/Urinary Tract: Unremarkable adrenal glands. Kidneys enhance symmetrically without focal lesion, stone, or hydronephrosis. Ureters are nondilated. Urinary bladder appears unremarkable for the degree of distention. Stomach/Bowel: Postsurgical changes of sleeve gastrectomy. The previously seen fluid collection adjacent to the suture line of the proximal stomach has resolved. No dilated loops of bowel. Normal appendix in the right lower quadrant. Long segment mild colonic wall thickening of the descending and sigmoid colon. Scattered colonic diverticula. No focally inflamed diverticulum is identified. Vascular/Lymphatic:  Scattered aortoiliac atherosclerotic calcifications without aneurysm. No abdominopelvic lymphadenopathy. Reproductive: Status post hysterectomy. No adnexal masses. Other: No free fluid. No abdominopelvic fluid collection. No pneumoperitoneum. No abdominal wall hernia. Musculoskeletal: No acute or significant osseous findings. IMPRESSION: 1. Long segment mild colonic wall thickening of the descending and sigmoid colon, compatible with a nonspecific colitis. 2. Colonic diverticulosis without evidence of acute diverticulitis. 3. Hepatic steatosis. 4. Aortic atherosclerosis (ICD10-I70.0). Electronically Signed   By: Duanne Guess D.O.   On: 09/14/2022 14:34   DG Chest Portable 1 View  Result Date: 09/14/2022 CLINICAL DATA:  gi bleed EXAM: PORTABLE CHEST 1 VIEW COMPARISON:  January 04, 2021 FINDINGS: The cardiomediastinal silhouette is  unchanged in contour. No pleural effusion. No pneumothorax. No acute pleuroparenchymal abnormality. IMPRESSION: No acute cardiopulmonary abnormality. Electronically Signed   By: Meda Klinefelter M.D.   On: 09/14/2022 13:42      Consults: Case discussed with Dr. Fatima Blank with family medicine who will admit, pending GI consult.   Case discussed with Dr. Elnoria Howard with [facility], they can be consulted tomorrow morning for further evaluation of the patient if admitted to family medicine.  Rediscussed case with family medicine, if patient is not going to have an active intervention today believe that she can be discharged home.  Will reconsult GI.  Reconsulted GI, Dr. Elnoria Howard, who stated that with a stable hemoglobin and no active, brisk bleed patient can be discharged home with Cipro, Flagyl, and will arrange for sooner follow up than pt's scheduled appointment next month. They have no documented colonoscopy since 2018.   Final Assessment and Plan:   47 year old female presents to the ED complaining of GI bleed for 2 days.  Associated lower abdominal pain.  She has diffuse abdominal tenderness but abdomen is soft, no peritoneal signs.  Patient nontoxic-appearing.  Vital signs unremarkable.  Workup initiated as above for further assessment.  Hemoglobin is normal.  On rectal exam, she has dark brown stool in the rectal vault, no gross melena or gross bleeding.  Is Hemoccult positive however. Hgb normal. Pt's pain improved with medications given in ED. Also given Protonix. She has mild hypokalemia, anion gap. Slightly more elevated LFTs than her baseline. No leukocytosis. Does have a lactic acid of 3.3. Vital signs have remained stable. Normal coags. Pt noted to attending she had an abnormal colonoscopy 2-3 months ago showing colon cancer. Cannot review this in system. Confirms multiple times it is through [employer] but unsure of GI doctor's name. Do see colonoscopy from 2018 with benign polyps. Initially discussed with  family medicine with plan to admit given lactic acidosis. Discussed with GI and they were unable to confirm recent colonoscopy however further lesions identified on CT scan, reviewed pt's labs and they recommend discharging pt home with close follow up on Cipro and Flagyl.  Patient has received 1.5 L of IV fluids.  Will reassess lactic acid/hgb to be sure that it is declining, complete p.o. challenge, and discharge home at GI request.  Care transitioned to oncoming PA-C, Jannifer Hick, pending disposition with anticipated discharge home.  Patient aware of this plan and agreeable to follow-up if needed.  Family members at bedside also updated.    Clinical Impression:  1. Acute GI bleeding   2. Abdominal pain, unspecified abdominal location   3. Hypokalemia   4. Colitis   5. Melena   6. Hematemesis with nausea   7. Lactic acidosis      Data Unavailable  Final Clinical Impression(s) / ED Diagnoses Final diagnoses:  Acute GI bleeding  Abdominal pain, unspecified abdominal location  Hypokalemia  Colitis  Melena  Hematemesis with nausea  Lactic acidosis    Rx / DC Orders ED Discharge Orders     None         Tonette Lederer, PA-C 09/14/22 1618    Laurence Spates, MD 09/14/22 [phone]

## 2022-09-14 NOTE — Assessment & Plan Note (Signed)
MCV 101.9.  Considering vitamin B-12 deficiency due to history of alcohol use and could be contributing to her bleeding.  - Vit B12 level pending

## 2022-09-14 NOTE — Hospital Course (Addendum)
Marisa [name] is a 48 y.o.female with a history of T2DM, GAD, PTSD, IBS, and migraine disorder who was admitted to the [facility] Medicine Teaching Service at [facility] for lactic acidosis. Her hospital course is detailed below:  Lactic Acidosis In the ED, patient presented with hematemesis, hematochezia for the past 2 days. Patient reported to be in some distress secondary to abdominal pain.  FOBT positive, no gross bleeding. Lactic acid elevated to 3.3 and trended up to 3.9 likely secondary to nausea, vomiting, diarrhea over the past couple days. CXR showed no acute cardiopulmonary abnormality. Patient received fluids in the ED and was started on IV Ciprofloxacin and Flagyl and was transitioned to PO for 7 days total. Lactic acid trended down to *** upon discharge.   Rectal Bleeding Patient reported abdominal pain, hematemesis, and hematochezia over the past several days. FOBT positive in the ED, but hemodynamically stable with Hgb 14.2. GI was consulted and due to patient's hemodynamic stability and no active brisk bleed, recommended no intervention at this time and sooner outpatient follow-up with [facility].  Elevated Liver Enzymes AST 163 and ALT 143.  Patient reports that she drinks 2 to 3 glasses of wine every other day.  No history of alcohol withdrawal with tremors or seizures. Liver enzymes down trended to ***.    Other chronic conditions were medically managed with home medications and formulary alternatives as necessary (***)  PCP Follow-up Recommendations:

## 2022-09-14 NOTE — Assessment & Plan Note (Addendum)
Lactic acid elevated to 3.3 and trended up to 3.9 likely secondary to nausea, vomiting, diarrhea over the past couple days.  S/p 1.5 L NS. Chest x-ray showed no acute cardiopulmonary abnormality.  CT abdomen pelvis with contrast shows nonspecific colitis, colonic diverticulosis with no acute diverticulitis, hepatic steatosis, and aortic atherosclerosis. - Admit to FMTS, attending Dr. Manson Passey - Med-Surg, Vital signs per floor - Regular diet  - VTE prophylaxis: SCDs, d/t potential GI bleed - Fluids: LR 100 mL/hr - s/p IV Cipro and Flagyl per EDP 7/28 - Transition to PO Ciprofloxacin 500 mg BID and Flagyl 500 mg BID for 7 day course total (7/29-8/3) - Pain regimen: Tylenol - AM CBC/BMP/trend Lactic acid

## 2022-09-14 NOTE — ED Notes (Signed)
ED TO INPATIENT HANDOFF REPORT  ED Nurse Name and Phone #: Theadora Rama [phone]  S Name/Age/Gender Marisa [name] 47 y.o. female Room/Bed: 035C/035C  Code Status   Code Status: Full Code  Home/SNF/Other Home Patient oriented to: self, place, time, and situation Is this baseline? Yes    Triage Complete: Triage complete  Chief Complaint Lactic acidosis [E87.20]  Triage Note Pt arrived POV from home c/o generalized abdominal pain and rectal bleeding that started today. Pt is also c/o N/V.    Allergies Allergies  Allergen Reactions   Asa [Aspirin] Anaphylaxis and Hives    Hives, chest tightness    Mushroom Extract Complex Anaphylaxis, Swelling and Other (See Comments)    Reaction:  Eye swelling   Penicillins Anaphylaxis and Other (See Comments)    Has patient had a PCN reaction causing immediate rash, facial/tongue/throat swelling, SOB or lightheadedness with hypotension: Yes Has patient had a PCN reaction causing severe rash involving mucus membranes or skin necrosis: No Has patient had a PCN reaction that required hospitalization No Has patient had a PCN reaction occurring within the last 10 years: No If all of the above answers are "NO", then may proceed with Cephalosporin use.   Shellfish Allergy Anaphylaxis   Triamcinolone Other (See Comments)    Skin issues     Level of Care/Admitting Diagnosis ED Disposition     ED Disposition  Admit   Condition  --   Comment  Hospital Area: [facility] [[id number]]  Level of Care: Med-Surg [16]  May place patient in observation at [facility] or [facility] if equivalent level of care is available:: No  Covid Evaluation: Asymptomatic - no recent exposure (last 10 days) testing not required  Diagnosis: Lactic acidosis [151761]  Admitting Physician: Fortunato Curling [[id number]]  Attending Physician: Westley Chandler [[id number]]          B Medical/Surgery History Past Medical History:  Diagnosis Date    Abdominal pain 04/26/2013   Abnormal uterine bleeding (AUB) 10/11/2012   Acute bronchitis    Allergy    Anal pain    chronic   Anemia    Anxiety    Asthma    exacerbation 02-28-2014 and 02-23-2014 secondary to Rhinovirus   Atypical chest pain 04/26/2013   Benign neoplasm of sigmoid colon    Benign neoplasm of transverse colon    Carbuncle of labium 07/12/2015   Chest pain 02/20/2014   Chronic diarrhea    Chronic headaches    Chronic low back pain    Cigarette nicotine dependence without complication 05/04/2014   Cyst of right ovary    Dandruff 03/26/2015   Diabetes mellitus without complication (HCC) 10/13/2013   Diabetic neuropathy, painful (HCC) 12/13/2019   Difficult intravenous access    PER PT NEEDS PICC LINE   Dyspnea 09/07/2012   Cleda Daub 08/2012:  No obstruction by FEV1%, but probable restriction.     Falls 03/27/2014   Food allergy    Mushrooms, shellfish   GAD (generalized anxiety disorder) 05/04/2014   Gait disturbance 04/11/2014   Gait instability    GERD (gastroesophageal reflux disease)    History of adenomatous polyp of colon    History of cardiac arrest    during SVD 1992   History of ectopic pregnancy    2009-  S/P LEFT SALPINGECTOMY   History of panic attacks    Hyperlipidemia    IBS (irritable bowel syndrome)    Insomnia 03/06/2014   Joint pain  Lower extremity edema    Lumbar stenosis L4 -- L5 with bulging disk   w/ right leg weakness/ decreased mobility   Migraine variant with headache 05/09/2014   Mild obstructive sleep apnea    study 03-20-2014  no cpap recommended   Neuromuscular disorder (HCC)    neuropathy in feet    Neuropathic pain of both legs 06/04/2016   Obesity (BMI 30-39.9) 03/05/2018   OSA (obstructive sleep apnea) 03/06/2014   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   Panniculitis 03/05/2018   Pelvic pain 07/25/2013   Persistent vomiting 04/27/2013   Pneumonia    PTSD (post-traumatic stress disorder) 05/04/2014   Rash  and nonspecific skin eruption 07/12/2015   Rectal bleeding 07/25/2013   RLQ abdominal pain    S/P Total vaginal hysterectomy on 01/06/13 01/06/2013   Sleep apnea    mild no cpap   Social anxiety disorder 05/04/2014   Sore throat 02/20/2014   Stomach ulcer    Tachycardia 02/20/2014   Type 2 diabetes mellitus (HCC)    Weakness of right leg    FROM BACK PROBLEM PER PT   Past Surgical History:  Procedure Laterality Date   ABDOMINAL HYSTERECTOMY     partial   COLONOSCOPY Left 04/29/2013   Procedure: COLONOSCOPY;  Surgeon: Willis Modena, MD;  Location: [facility];  Service: Endoscopy;  Laterality: Left;   COLONOSCOPY     COLONOSCOPY WITH PROPOFOL N/A 08/01/2016   Procedure: COLONOSCOPY WITH PROPOFOL;  Surgeon: Sherrilyn Rist, MD;  Location: [facility];  Service: Gastroenterology;  Laterality: N/A;   ECTOPIC PREGNANCY SURGERY     ESOPHAGOGASTRODUODENOSCOPY (EGD) WITH PROPOFOL N/A 11/10/2018   Procedure: ESOPHAGOGASTRODUODENOSCOPY (EGD) WITH PROPOFOL;  Surgeon: Sherrilyn Rist, MD;  Location: [facility];  Service: Gastroenterology;  Laterality: N/A;   EVALUATION UNDER ANESTHESIA WITH FISTULECTOMY N/A 04/20/2014   Procedure: EXAM UNDER ANESTHESIA ;  Surgeon: Romie Levee, MD;  Location[facility];  Service: General;  Laterality: N/A;   FLEXIBLE SIGMOIDOSCOPY N/A 11/09/2013   Procedure: FLEXIBLE SIGMOIDOSCOPY;  Surgeon: Willis Modena, MD;  [facility];  Service: Endoscopy;  Laterality: N/A;   fupa removal      LAPAROSCOPIC CHOLECYSTECTOMY  2005   LAPAROSCOPIC GASTRIC SLEEVE RESECTION N/A 12/24/2020   Procedure: LAPAROSCOPIC GASTRIC SLEEVE RESECTION;  Surgeon: Berna Bue, MD;  Location: [facility];  Service: General;  Laterality: N/A;   REFRACTIVE SURGERY     SPHINCTEROTOMY N/A 04/20/2014   Procedure:  LATERAL INTERNAL SPHINCTEROTOMY;  Surgeon: Romie Levee, MD;  Location: [facility];  Service: General;  Laterality: N/A;    TRANSTHORACIC ECHOCARDIOGRAM  12/30/2012   mild LVH/  ef 55-60%   UNILATERAL SALPINGECTOMY  2009   laparotomy left salpingectomy-- ectopic preg.   UPPER GASTROINTESTINAL ENDOSCOPY     UPPER GI ENDOSCOPY N/A 12/24/2020   Procedure: UPPER GI ENDOSCOPY;  Surgeon: Berna Bue, MD;  Location: [facility];  Service: General;  Laterality: N/A;   VAGINAL HYSTERECTOMY N/A 01/06/2013   Procedure: HYSTERECTOMY VAGINAL;  Surgeon: Tereso Newcomer, MD;  Location: [facility];  Service: Gynecology;  Laterality: N/A;     A IV Location/Drains/Wounds Patient Lines/Drains/Airways Status     Active Line/Drains/Airways     Name Placement date Placement time Site Days   Peripheral IV 09/14/22 20 G Right Antecubital 09/14/22  1322  Antecubital  less than 1   Peripheral IV 09/14/22 20 G 1.88" Anterior;Right Forearm 09/14/22  1729  Forearm  less than 1  Incision (Closed) 12/24/20 Abdomen Other (Comment) 12/24/20  1324  -- 629   Incision - 6 Ports Abdomen 1: Right;Lateral 2: Right;Medial 3: Mid;Upper 4: Left;Medial 5: Left;Mid 6: Left;Lateral 12/24/20  1222  -- 629            Intake/Output Last 24 hours  Intake/Output Summary (Last 24 hours) at 09/14/2022 2158 Last data filed at 09/14/2022 1643 Gross per 24 hour  Intake 1000 ml  Output --  Net 1000 ml    Labs/Imaging Results for orders placed or performed during the hospital encounter of 09/14/22 (from the past 48 hour(s))  Lipase, blood     Status: None   Collection Time: 09/14/22 10:51 AM  Result Value Ref Range   Lipase 40 11 - 51 U/L    Comment: Performed at [facility], [address], [place], [place] [address]  Comprehensive metabolic panel     Status: Abnormal   Collection Time: 09/14/22 10:51 AM  Result Value Ref Range   Sodium 143 135 - 145 mmol/L   Potassium 3.4 (L) 3.5 - 5.1 mmol/L   Chloride 106 98 - 111 mmol/L   CO2 20 (L) 22 - 32 mmol/L   Glucose, Bld 78 70 - 99 mg/dL    Comment: Glucose reference range applies only to  samples taken after fasting for at least 8 hours.   BUN <5 (L) 6 - 20 mg/dL   Creatinine, Ser 4.33 0.44 - 1.00 mg/dL   Calcium 8.6 (L) 8.9 - 10.3 mg/dL   Total Protein 8.1 6.5 - 8.1 g/dL   Albumin 3.6 3.5 - 5.0 g/dL   AST 295 (H) 15 - 41 U/L   ALT 163 (H) 0 - 44 U/L   Alkaline Phosphatase 71 38 - 126 U/L   Total Bilirubin 0.4 0.3 - 1.2 mg/dL   GFR, Estimated >18 >84 mL/min    Comment: (NOTE) Calculated using the CKD-EPI Creatinine Equation (2021)    Anion gap 17 (H) 5 - 15    Comment: Performed at [facility], [address], [place], [place] [address]  CBC     Status: Abnormal   Collection Time: 09/14/22 10:51 AM  Result Value Ref Range   WBC 8.6 4.0 - 10.5 K/uL   RBC 4.23 3.87 - 5.11 MIL/uL   Hemoglobin 14.2 12.0 - 15.0 g/dL   HCT 30.1 60.1 - 09.3 %   MCV 101.9 (H) 80.0 - 100.0 fL   MCH 33.6 26.0 - 34.0 pg   MCHC 32.9 30.0 - 36.0 g/dL   RDW 23.5 57.3 - 22.0 %   Platelets 492 (H) 150 - 400 K/uL   nRBC 0.0 0.0 - 0.2 %    Comment: Performed at [facility], [address] [place], [place] [address]  Type and screen [facility]     Status: None   Collection Time: 09/14/22 10:51 AM  Result Value Ref Range   ABO/RH(D) O POS    Antibody Screen NEG    Sample Expiration      09/17/2022,2359 Performed at [facility], [address], [place], [place] [address]   Urinalysis, Routine w reflex microscopic -Urine, Clean Catch     Status: Abnormal   Collection Time: 09/14/22 11:15 AM  Result Value Ref Range   Color, Urine YELLOW YELLOW   APPearance CLEAR CLEAR   Specific Gravity, Urine 1.006 1.005 - 1.030   pH 6.0 5.0 - 8.0   Glucose, UA NEGATIVE  NEGATIVE mg/dL   Hgb urine dipstick MODERATE (A) NEGATIVE   Bilirubin Urine NEGATIVE NEGATIVE   Ketones, ur 20 (A) NEGATIVE mg/dL   Protein, ur NEGATIVE NEGATIVE mg/dL   Nitrite NEGATIVE NEGATIVE   Leukocytes,Ua TRACE (A) NEGATIVE   RBC / HPF 0-5 0 - 5 RBC/hpf   WBC, UA 0-5 0 - 5 WBC/hpf    Bacteria, UA RARE (A) NONE SEEN   Squamous Epithelial / HPF 6-10 0 - 5 /HPF   Mucus PRESENT     Comment: Performed at [facility], [address], [place], [place] [address]  Protime-INR     Status: None   Collection Time: 09/14/22  1:24 PM  Result Value Ref Range   Prothrombin Time 13.6 11.4 - 15.2 seconds   INR 1.0 0.8 - 1.2    Comment: (NOTE) INR goal varies based on device and disease states. Performed at [facility], [address] [place], [place] [address]   APTT     Status: None   Collection Time: 09/14/22  1:24 PM  Result Value Ref Range   aPTT 27 24 - 36 seconds    Comment: Performed at [facility], [address], [place], [place] [address]  I-Stat CG4 Lactic Acid     Status: Abnormal   Collection Time: 09/14/22  1:33 PM  Result Value Ref Range   Lactic Acid, Venous 3.3 (HH) 0.5 - 1.9 mmol/L   Comment NOTIFIED PHYSICIAN   POC occult blood, ED     Status: Abnormal   Collection Time: 09/14/22  1:34 PM  Result Value Ref Range   Fecal Occult Bld POSITIVE (A) NEGATIVE  I-Stat CG4 Lactic Acid     Status: Abnormal   Collection Time: 09/14/22  4:25 PM  Result Value Ref Range   Lactic Acid, Venous 3.9 (HH) 0.5 - 1.9 mmol/L   Comment NOTIFIED PHYSICIAN    CT ABDOMEN PELVIS W CONTRAST  Result Date: 09/14/2022 CLINICAL DATA:  Left lower quadrant abdominal pain EXAM: CT ABDOMEN AND PELVIS WITH CONTRAST TECHNIQUE: Multidetector CT imaging of the abdomen and pelvis was performed using the standard protocol following bolus administration of intravenous contrast. RADIATION DOSE REDUCTION: This exam was performed according to the departmental dose-optimization program which includes automated exposure control, adjustment of the mA and/or kV according to patient size and/or use of iterative reconstruction technique. CONTRAST:  75mL OMNIPAQUE IOHEXOL 350 MG/ML SOLN COMPARISON:  01/04/2021 FINDINGS: Lower chest: No acute abnormality. Hepatobiliary: Diffusely decreased  attenuation of the hepatic parenchyma. No focal liver lesion is identified. Status post cholecystectomy. No biliary dilatation. Pancreas: Unremarkable. No pancreatic ductal dilatation or surrounding inflammatory changes. Spleen: Normal in size without focal abnormality. Adrenals/Urinary Tract: Unremarkable adrenal glands. Kidneys enhance symmetrically without focal lesion, stone, or hydronephrosis. Ureters are nondilated. Urinary bladder appears unremarkable for the degree of distention. Stomach/Bowel: Postsurgical changes of sleeve gastrectomy. The previously seen fluid collection adjacent to the suture line of the proximal stomach has resolved. No dilated loops of bowel. Normal appendix in the right lower quadrant. Long segment mild colonic wall thickening of the descending and sigmoid colon. Scattered colonic diverticula. No focally inflamed diverticulum is identified. Vascular/Lymphatic: Scattered aortoiliac atherosclerotic calcifications without aneurysm. No abdominopelvic lymphadenopathy. Reproductive: Status post hysterectomy. No adnexal masses. Other: No free fluid. No abdominopelvic fluid collection. No pneumoperitoneum. No abdominal wall hernia. Musculoskeletal: No acute or significant osseous findings. IMPRESSION: 1. Long segment mild colonic wall thickening of the descending and sigmoid colon, compatible with a  nonspecific colitis. 2. Colonic diverticulosis without evidence of acute diverticulitis. 3. Hepatic steatosis. 4. Aortic atherosclerosis (ICD10-I70.0). Electronically Signed   By: Duanne Guess D.O.   On: 09/14/2022 14:34   DG Chest Portable 1 View  Result Date: 09/14/2022 CLINICAL DATA:  gi bleed EXAM: PORTABLE CHEST 1 VIEW COMPARISON:  January 04, 2021 FINDINGS: The cardiomediastinal silhouette is unchanged in contour. No pleural effusion. No pneumothorax. No acute pleuroparenchymal abnormality. IMPRESSION: No acute cardiopulmonary abnormality. Electronically Signed   By: Meda Klinefelter M.D.   On: 09/14/2022 13:42    Pending Labs Unresulted Labs (From admission, onward)     Start     Ordered   09/15/22 0000  Lactic acid, plasma  (Lactic Acid)  Once,   R        09/14/22 1752   09/15/22 0000  Hepatitis panel, acute  Once,   R        09/14/22 1752   09/15/22 0000  Vitamin B12  Once,   R        09/14/22 1752   09/15/22 0000  Comprehensive metabolic panel  Once,   R        09/14/22 1752   09/15/22 0000  CBC  Once,   R        09/14/22 1752   09/14/22 1657  HIV Antibody (routine testing w rflx)  (HIV Antibody (Routine testing w reflex) panel)  Once,   R        09/14/22 1659   09/14/22 1430  Blood culture (routine x 2)  BLOOD CULTURE X 2,   R (with STAT occurrences)      09/14/22 1429   09/14/22 1345  Hemoglobin and hematocrit, blood  Once,   STAT        09/14/22 1345            Vitals/Pain Today's Vitals   09/14/22 2044 09/14/22 2044 09/14/22 2045 09/14/22 2047  BP:   116/71 116/71  Pulse:    83  Resp:   11 17  Temp:   98 F (36.7 C) 98 F (36.7 C)  SpO2:   98% 98%  Weight:    95 kg  Height:    5\' 5"  (1.651 m)  PainSc: 10-Worst pain ever 10-Worst pain ever  10-Worst pain ever    Isolation Precautions No active isolations  Medications Medications  lactated ringers infusion ( Intravenous New Bag/Given 09/14/22 1733)  sertraline (ZOLOFT) tablet 50 mg (50 mg Oral Not Given 09/14/22 1700)  gabapentin (NEURONTIN) capsule 300 mg (has no administration in time range)  albuterol (PROVENTIL) (2.5 MG/3ML) 0.083% nebulizer solution 2.5 mg (has no administration in time range)  ondansetron (ZOFRAN) tablet 4 mg (has no administration in time range)    Or  ondansetron (ZOFRAN) injection 4 mg (has no administration in time range)  acetaminophen (TYLENOL) tablet 650 mg (650 mg Oral Given 09/14/22 1829)  metroNIDAZOLE (FLAGYL) tablet 500 mg (has no administration in time range)  ciprofloxacin (CIPRO) tablet 500 mg (has no administration in time range)   oxyCODONE (Oxy IR/ROXICODONE) immediate release tablet 5 mg (5 mg Oral Given 09/14/22 2151)  pantoprazole (PROTONIX) injection 40 mg (40 mg Intravenous Given 09/14/22 1323)  morphine (PF) 4 MG/ML injection 4 mg (4 mg Intravenous Given 09/14/22 1322)  ondansetron (ZOFRAN) injection 4 mg (4 mg Intravenous Given 09/14/22 1322)  sodium chloride 0.9 % bolus 1,000 mL (0 mLs Intravenous Stopped 09/14/22 1643)  iohexol (OMNIPAQUE) 350 MG/ML injection 75 mL (75 mLs  Intravenous Contrast Given 09/14/22 1413)  sodium chloride 0.9 % bolus 500 mL (0 mLs Intravenous Stopped 09/14/22 1539)  potassium chloride SA (KLOR-CON M) CR tablet 40 mEq (40 mEq Oral Given 09/14/22 1439)  HYDROmorphone (DILAUDID) injection 1 mg (1 mg Intravenous Given 09/14/22 1428)  ciprofloxacin (CIPRO) IVPB 400 mg (0 mg Intravenous Stopped 09/14/22 1743)  HYDROmorphone (DILAUDID) injection 0.5 mg (0.5 mg Intravenous Given 09/14/22 1621)    Mobility walks     Focused Assessments Cardiac Assessment Handoff:  Cardiac Rhythm: Normal sinus rhythm Lab Results  Component Value Date   CKTOTAL 61 10/25/2011   CKMB 0.8 10/25/2011   TROPONINI <0.03 07/28/2018   Lab Results  Component Value Date   DDIMER <0.27 03/02/2020   Does the Patient currently have chest pain?  no  , Neuro Assessment Handoff:  Swallow screen pass? Yes  Cardiac Rhythm: Normal sinus rhythm       Neuro Assessment: Within Defined Limits Neuro Checks:      Has TPA been given? No If patient is a Neuro Trauma and patient is going to OR before floor call report to 4N Charge nurse: [phone] or [phone]  , Pulmonary Assessment Handoff:  Lung sounds:   O2 Device: Room Air      R Recommendations: See Admitting Provider Note  Report given to:   Additional Notes:

## 2022-09-14 NOTE — Assessment & Plan Note (Addendum)
K+ at 3.4 on admission likely due to GI losses. - Replete with 40 meq PO - Repeat CMP at [facility]

## 2022-09-14 NOTE — Assessment & Plan Note (Addendum)
AST 163 and ALT 143.  Patient reports that she drinks 2 to 3 glasses of wine every other day.  No history of alcohol withdrawal with tremors or seizures. - Monitor CMP - Hepatitis panel pending

## 2022-09-15 ENCOUNTER — Encounter (HOSPITAL_COMMUNITY): Payer: Self-pay | Admitting: Family Medicine

## 2022-09-15 ENCOUNTER — Telehealth: Payer: Self-pay

## 2022-09-15 ENCOUNTER — Telehealth: Payer: Self-pay | Admitting: Critical Care Medicine

## 2022-09-15 DIAGNOSIS — E876 Hypokalemia: Secondary | ICD-10-CM | POA: Diagnosis present

## 2022-09-15 DIAGNOSIS — J45909 Unspecified asthma, uncomplicated: Secondary | ICD-10-CM | POA: Diagnosis present

## 2022-09-15 DIAGNOSIS — Z880 Allergy status to penicillin: Secondary | ICD-10-CM | POA: Diagnosis not present

## 2022-09-15 DIAGNOSIS — E785 Hyperlipidemia, unspecified: Secondary | ICD-10-CM | POA: Diagnosis present

## 2022-09-15 DIAGNOSIS — Z9884 Bariatric surgery status: Secondary | ICD-10-CM | POA: Diagnosis not present

## 2022-09-15 DIAGNOSIS — K5731 Diverticulosis of large intestine without perforation or abscess with bleeding: Secondary | ICD-10-CM | POA: Diagnosis present

## 2022-09-15 DIAGNOSIS — N898 Other specified noninflammatory disorders of vagina: Secondary | ICD-10-CM | POA: Insufficient documentation

## 2022-09-15 DIAGNOSIS — K529 Noninfective gastroenteritis and colitis, unspecified: Secondary | ICD-10-CM

## 2022-09-15 DIAGNOSIS — E872 Acidosis, unspecified: Secondary | ICD-10-CM | POA: Diagnosis present

## 2022-09-15 DIAGNOSIS — E669 Obesity, unspecified: Secondary | ICD-10-CM | POA: Diagnosis present

## 2022-09-15 DIAGNOSIS — R079 Chest pain, unspecified: Secondary | ICD-10-CM | POA: Diagnosis not present

## 2022-09-15 DIAGNOSIS — D519 Vitamin B12 deficiency anemia, unspecified: Secondary | ICD-10-CM | POA: Diagnosis present

## 2022-09-15 DIAGNOSIS — E1140 Type 2 diabetes mellitus with diabetic neuropathy, unspecified: Secondary | ICD-10-CM | POA: Diagnosis present

## 2022-09-15 DIAGNOSIS — Z6834 Body mass index (BMI) 34.0-34.9, adult: Secondary | ICD-10-CM | POA: Diagnosis not present

## 2022-09-15 DIAGNOSIS — K921 Melena: Secondary | ICD-10-CM | POA: Diagnosis present

## 2022-09-15 DIAGNOSIS — G43909 Migraine, unspecified, not intractable, without status migrainosus: Secondary | ICD-10-CM | POA: Diagnosis present

## 2022-09-15 DIAGNOSIS — B955 Unspecified streptococcus as the cause of diseases classified elsewhere: Secondary | ICD-10-CM | POA: Diagnosis present

## 2022-09-15 DIAGNOSIS — Z888 Allergy status to other drugs, medicaments and biological substances status: Secondary | ICD-10-CM | POA: Diagnosis not present

## 2022-09-15 DIAGNOSIS — Z886 Allergy status to analgesic agent status: Secondary | ICD-10-CM | POA: Diagnosis not present

## 2022-09-15 DIAGNOSIS — F4001 Agoraphobia with panic disorder: Secondary | ICD-10-CM | POA: Diagnosis present

## 2022-09-15 DIAGNOSIS — Z87891 Personal history of nicotine dependence: Secondary | ICD-10-CM | POA: Diagnosis not present

## 2022-09-15 DIAGNOSIS — Z91013 Allergy to seafood: Secondary | ICD-10-CM | POA: Diagnosis not present

## 2022-09-15 DIAGNOSIS — R7881 Bacteremia: Secondary | ICD-10-CM | POA: Diagnosis not present

## 2022-09-15 DIAGNOSIS — F4310 Post-traumatic stress disorder, unspecified: Secondary | ICD-10-CM | POA: Diagnosis present

## 2022-09-15 DIAGNOSIS — Z9109 Other allergy status, other than to drugs and biological substances: Secondary | ICD-10-CM | POA: Diagnosis not present

## 2022-09-15 DIAGNOSIS — Z818 Family history of other mental and behavioral disorders: Secondary | ICD-10-CM | POA: Diagnosis not present

## 2022-09-15 DIAGNOSIS — Z803 Family history of malignant neoplasm of breast: Secondary | ICD-10-CM | POA: Diagnosis not present

## 2022-09-15 LAB — CBC
HCT: 34.1 % — ABNORMAL LOW (ref 36.0–46.0)
HCT: 36.6 % (ref 36.0–46.0)
Hemoglobin: 11.5 g/dL — ABNORMAL LOW (ref 12.0–15.0)
Hemoglobin: 12.6 g/dL (ref 12.0–15.0)
MCH: 32.6 pg (ref 26.0–34.0)
MCH: 34.1 pg — ABNORMAL HIGH (ref 26.0–34.0)
MCHC: 33.7 g/dL (ref 30.0–36.0)
MCHC: 34.4 g/dL (ref 30.0–36.0)
MCV: 96.6 fL (ref 80.0–100.0)
MCV: 98.9 fL (ref 80.0–100.0)
Platelets: 383 10*3/uL (ref 150–400)
Platelets: 415 10*3/uL — ABNORMAL HIGH (ref 150–400)
RBC: 3.53 MIL/uL — ABNORMAL LOW (ref 3.87–5.11)
RBC: 3.7 MIL/uL — ABNORMAL LOW (ref 3.87–5.11)
RDW: 12.2 % (ref 11.5–15.5)
RDW: 12.3 % (ref 11.5–15.5)
WBC: 6 10*3/uL (ref 4.0–10.5)
WBC: 7.5 10*3/uL (ref 4.0–10.5)
nRBC: 0 % (ref 0.0–0.2)
nRBC: 0 % (ref 0.0–0.2)

## 2022-09-15 LAB — WET PREP, GENITAL
Clue Cells Wet Prep HPF POC: NONE SEEN
Sperm: NONE SEEN
Trich, Wet Prep: NONE SEEN
WBC, Wet Prep HPF POC: <10 (ref <10)
Yeast Wet Prep HPF POC: NONE SEEN

## 2022-09-15 LAB — LACTIC ACID, PLASMA: Lactic Acid, Venous: 0.9 mmol/L (ref 0.5–1.9)

## 2022-09-15 MED ORDER — VANCOMYCIN HCL 750 MG/150ML IV SOLN
750.0000 mg | Freq: Two times a day (BID) | INTRAVENOUS | Status: DC
  Administered 2022-09-16 – 2022-09-18 (×5): 750 mg via INTRAVENOUS
  Filled 2022-09-15 (×5): qty 150

## 2022-09-15 MED ORDER — VANCOMYCIN HCL 2000 MG/400ML IV SOLN
2000.0000 mg | Freq: Once | INTRAVENOUS | Status: AC
  Administered 2022-09-15: 2000 mg via INTRAVENOUS
  Filled 2022-09-15: qty 400

## 2022-09-15 MED ORDER — OXYCODONE HCL 5 MG PO TABS
5.0000 mg | ORAL_TABLET | ORAL | Status: DC | PRN
  Administered 2022-09-15 – 2022-09-18 (×8): 5 mg via ORAL
  Filled 2022-09-15 (×8): qty 1

## 2022-09-15 MED ORDER — POTASSIUM CHLORIDE CRYS ER 20 MEQ PO TBCR
60.0000 meq | EXTENDED_RELEASE_TABLET | Freq: Once | ORAL | Status: AC
  Administered 2022-09-15: 60 meq via ORAL
  Filled 2022-09-15: qty 3

## 2022-09-15 MED ORDER — AZITHROMYCIN 500 MG PO TABS
500.0000 mg | ORAL_TABLET | Freq: Every day | ORAL | Status: AC
  Administered 2022-09-15 – 2022-09-17 (×3): 500 mg via ORAL
  Filled 2022-09-15 (×3): qty 1

## 2022-09-15 NOTE — Plan of Care (Signed)

## 2022-09-15 NOTE — Assessment & Plan Note (Signed)
MCV 101.9.  Considering vitamin B-12 deficiency due to history of alcohol use and could be contributing to her bleeding.  - Vit B12 level pending

## 2022-09-15 NOTE — Discharge Summary (Incomplete)
Family Medicine Teaching [facility] Discharge Summary  Patient name: Marisa [name] Medical record number: [id number] Date of birth: Sep 09, 1975 Age: 47 y.o. Gender: female Date of Admission: 09/14/2022  Date of Discharge: 09/15/22  Admitting Physician: Fortunato Curling, DO  Primary Care Provider: Storm Frisk, MD Consultants: GI  Indication for Hospitalization: Lactic acidosis 2/2 hematemesis and hematochezia  Brief Hospital Course:  VICIE BRAUTIGAN is a 47 y.o.female with a history of T2DM, GAD, PTSD, IBS, and migraine disorder who was admitted to the [facility] Medicine Teaching Service at [facility] for lactic acidosis. Her hospital course is detailed below:  Colitis Lactic acid elevated to 3.3 and trended up to 3.9 likely secondary to nausea, vomiting, diarrhea over the past couple days.  S/p 1.5 L NS. Chest x-ray showed no acute cardiopulmonary abnormality.  CT abdomen pelvis with  contrast shows nonspecific colitis, colonic diverticulosis with no acute diverticulitis, hepatic steatosis, and aortic atherosclerosis. Started on IV Ciprofloxacin and Flagyl and was transitioned to PO for 7 days total (7/28 - 8/3). Will transition to Azithromycin 500 mg BID and Flagyl 500 mg BID for 5 more days.  Lactic Acidosis In the ED, patient presented with hematemesis, hematochezia for the past 2 days. Patient reported to be in some distress secondary to abdominal pain.  FOBT positive, no gross bleeding. Lactic acid elevated to 3.3 and trended up to 3.9 likely secondary to nausea, vomiting, diarrhea over the past couple days. Patient received fluids in the ED and was Lactic acid trended down to 2.5 upon discharge.   Rectal Bleeding Patient reported abdominal pain, hematemesis, and hematochezia over the past several days. FOBT positive in the ED, but hemodynamically stable with Hgb 14.2. GI was consulted and due to patient's hemodynamic stability and no active brisk bleed, recommended no  intervention at this time and sooner outpatient follow-up with [facility].  Elevated Liver Enzymes AST 163 and ALT 143.  Patient reports that she drinks 2 to 3 glasses of wine every other day.  No history of alcohol withdrawal with tremors or seizures. Liver enzymes down trended to AST 87 and ALT 117.    Other chronic conditions were medically managed with home medications and formulary alternatives as necessary:  GAD/PTSD: Continue Zoloft 50 mg Nausea/Vomiting: Has improved since admission. Zofran 4 mg q6h PRN Asthma: Continue Albuterol 2.5 mg q6h PRN  PCP Follow-up Recommendations: Please ensure patient's symptoms have improved after taking her full course of antibiotics.    Discharge Diagnoses/Problem List:  Principal Problem:   Colitis Active Problems:   Rectal bleeding   Elevated liver enzymes   Elevated MCV   Hypokalemia   Disposition: Home  Discharge Condition: Stable  Discharge Exam: General: pleasant woman in NAD, awake and alert Eyes: wear glasses Cardiovascular: RRR. No M/R/G. Respiratory: CTAB. No crackles, wheezing, or rhonchi Gastrointestinal: soft, mild diffuse tenderness, non-distended. Normoactive bowel sounds MSK: no BLE edema  Significant Procedures: none  Significant Labs and Imaging:  Recent Labs  Lab 09/14/22 1051 09/15/22 0012  WBC 8.6 6.0  HGB 14.2 11.5*  HCT 43.1 34.1*  PLT 492* 415*   Recent Labs  Lab 09/14/22 1051 09/15/22 0012  NA 143 139  K 3.4* 3.2*  CL 106 105  CO2 20* 22  GLUCOSE 78 81  BUN <5* <5*  CREATININE 0.54 0.56  CALCIUM 8.6* 7.7*  ALKPHOS 71 66  AST 143* 87*  ALT 163* 117*  ALBUMIN 3.6 3.0*    Lactic Acid: 3.3 > 3.9 > 2.5  Results/Tests Pending at Time of Discharge: none  Discharge Medications:  Allergies as of 09/15/2022       Reactions   Asa [aspirin] Anaphylaxis, Hives   Hives, chest tightness   Mushroom Extract Complex Anaphylaxis, Swelling, Other (See Comments)   Reaction:  Eye swelling    Penicillins Anaphylaxis, Other (See Comments)   Has patient had a PCN reaction causing immediate rash, facial/tongue/throat swelling, SOB or lightheadedness with hypotension: Yes Has patient had a PCN reaction causing severe rash involving mucus membranes or skin necrosis: No Has patient had a PCN reaction that required hospitalization No Has patient had a PCN reaction occurring within the last 10 years: No If all of the above answers are "NO", then may proceed with Cephalosporin use.   Shellfish Allergy Anaphylaxis   Triamcinolone Other (See Comments)   Skin issues      Med Rec must be completed prior to using this [facility]***       Discharge Instructions: Please refer to Patient Instructions section of EMR for full details.  Patient was counseled important signs and symptoms that should prompt return to medical care, changes in medications, dietary instructions, activity restrictions, and follow up appointments.   Follow-Up Appointments:   Fortunato Curling, DO 09/15/2022, 11:37 AM PGY-1, [facility]

## 2022-09-15 NOTE — Telephone Encounter (Signed)
Call returned. Spoke to [place]. Pt is not currently on insulin, so insurance will only cover once daily testing. Information provided and ok given for once daily testing.

## 2022-09-15 NOTE — Telephone Encounter (Signed)
Patient has been scheduled for next available appt with Dr. Myrtie Neither on 12/11/22 at 1:40 pm. Appt information mailed to patient.

## 2022-09-15 NOTE — Telephone Encounter (Signed)
Mary from [facility] has called in regards to 2 faxes they have sent over (one was sent on 09/04/2022 & the other was sent over on 09/09/2022) in regards to patients accu-check test strips. Per Corrie Dandy, they need to confirm how many times a day patient is testing, please advise.   [facility] Callback # [phone] FAX #: [phone]

## 2022-09-15 NOTE — Assessment & Plan Note (Signed)
AST 163>87 and ALT 143>117. Hepatitis panel negative. Patient reports that she drinks 2 to 3 glasses of wine every other day.  No history of alcohol withdrawal with tremors or seizures. Likely secondary to her symptoms upon admission.

## 2022-09-15 NOTE — Progress Notes (Addendum)
Patient-reported Beta-Lactam Allergy Assessment  Specific drug that caused reaction: Amoxicillin presumed (pink thick liquid) Reaction(s) that occurred: anaphylaxis  Antibiotics tolerated in the past: Patient reported:   unable to recall any cephalosporins tried Historical data obtained from the EMR:   no other PCN or cephalosporin tried  Information received from:  Patient   Due to reported unknown reaction, the following questions were asked about the Penicillin allergy:  How long after taking the drug did the reaction occur?  Unknown/ cannot recall  How long ago did the reaction occur?  Unknown/ childhood reaction (~48 yo)  What was done to manage the reaction?  ER visit   Based on the above interview, it has been deemed appropriate for patient to be administered Cephalexin, Cefazolin, Cefepime, Ceftazidime, and Ceftriaxone as the risk for cross-reactivity to documented reaction is low.    Thank you for involving pharmacy in this patient's care.  Loura Back, PharmD, BCPS Clinical Pharmacist Clinical phone for 09/15/2022 is [phone] 09/15/2022 1:17 PM

## 2022-09-15 NOTE — Assessment & Plan Note (Addendum)
Patient complains of vaginal itching and some spotting and dysuria today. - Wet prep negative - GC/Chlamydia negative

## 2022-09-15 NOTE — Progress Notes (Addendum)
Daily Progress Note Intern Pager: [phone]  Patient name: Marisa [name] Medical record number: [id number] Date of birth: [birth year] Age: 47 y.o. Gender: female  Primary Care Provider: Storm Frisk, MD Consultants: GI Code Status: Full Code  Pt Overview and Major Events to Date:  7/28: Admission 7/29: Transition Ciprofloxacin to  Assessment and Plan: Marisa [name] is a 47 y.o. female with past medical history of T2DM, migraine disorder, GAD, PTSD, and IBS presenting with hematemesis and hematochezia for the last 2 days. Differential for presentation of this includes Lactic acidosis secondary to N/V/D, GI bleed, colitis, gastritis, diverticulitis, and pancreatitis.   [facility]     * (Principal) Colitis     Lactic acid elevated to 3.3>3.9>2.5>0.9 likely secondary to nausea,  vomiting, diarrhea over the past couple days.  S/p 1.5 L NS and LR  maintenance fluids at 125 mL/hr. Chest x-ray showed no acute  cardiopulmonary abnormality.  CT abdomen pelvis with contrast shows  nonspecific colitis, colonic diverticulosis with no acute diverticulitis,  hepatic steatosis, and aortic atherosclerosis. - VTE prophylaxis: SCDs, d/t potential GI bleed - s/p IV Cipro and Flagyl per EDP 7/28 and one dose of PO Ciprofloxacin  500 mg and Flagyl 500 mg (7/29) - Transition to Azithromycin 500 mg daily x 3 days (7/29-7/31) - Pain regimen: Tylenol, Oxycodone 5 mg q4h PRN, Gabapentin 300 mg BID         Rectal bleeding     Patient reports abdominal pain, hematemesis, hematochezia over the past  several days.  FOBT positive in ED.  Hemodynamically stable, hemoglobin  14.2 and platelets 492.  Not on anticoagulation and reports infrequent use  of NSAIDs.  GI was consulted in the ED and does not plan to scope at this  time due to patient's hemodynamic stability and no active brisk bleed,  recommended sooner outpatient follow-up with [facility]. - Reconsult GI if concern for  active bleed otherwise GI will assess  outpatient        Elevated liver enzymes     AST 163>87 and ALT 143>117. Hepatitis panel negative. Patient reports  that she drinks 2 to 3 glasses of wine every other day.  No history of  alcohol withdrawal with tremors or seizures. Likely secondary to her  symptoms upon admission.        Hypokalemia     K+ at 3.4>3.2 on admission likely due to GI losses. - Repleted with 100 meq PO        Vaginal itching     Patient complains of vaginal itching and some spotting and dysuria  today. - Wet prep and GC/Chlamydia pending     Chronic Stable Conditions GAD/PTSD: Continue Zoloft 50 mg Nausea/Vomiting: Has improved since admission. Zofran 4 mg q6h PRN Asthma: Continue Albuterol 2.5 mg q6h PRN   FEN/GI: Regular Diet, as tolerated PPx: SCDs Dispo: home tomorrow  Subjective:  Patient is doing well this morning.  No new episodes of vomiting or bloody bowel movements.  Patient complains of some vaginal discharge/bleeding likely due to some vaginal itching she endorses as well.  Objective: Temp:  [97.6 F (36.4 C)-98.3 F (36.8 C)] 98.2 F (36.8 C) (07/29 1205) Pulse Rate:  [66-102] 79 (07/29 1205) Resp:  [11-23] 17 (07/29 1205) BP: (98-140)/(59-89) 137/89 (07/29 1205) SpO2:  [93 %-100 %] 94 % (07/29 1205) Weight:  [95 kg] 95 kg (07/28 2047) Physical Exam: General: Awake and alert.  NAD. Cardiovascular: RRR. No  M/R/G. Respiratory: CTAB.  Normal WOB on room air. No crackles, wheezing, or rhonchi Abdomen: soft, mild diffuse tenderness, non-distended. Normotensive bowel sounds. Extremities: no BLE edema.  Laboratory: Most recent CBC Lab Results  Component Value Date   WBC 7.5 09/15/2022   HGB 12.6 09/15/2022   HCT 36.6 09/15/2022   MCV 98.9 09/15/2022   PLT 383 09/15/2022   Most recent BMP    Latest Ref Rng & Units 09/15/2022   12:12 AM  BMP  Glucose 70 - 99 mg/dL 81   BUN 6 - 20 mg/dL <5   Creatinine 1.61 - 1.00 mg/dL 0.96    Sodium 045 - 409 mmol/L 139   Potassium 3.5 - 5.1 mmol/L 3.2   Chloride 98 - 111 mmol/L 105   CO2 22 - 32 mmol/L 22   Calcium 8.9 - 10.3 mg/dL 7.7     Lactic acid: 8.1>1.9>1.4>7.8 Vitamin B12: 987  Imaging/Diagnostic Tests: No new imaging.  Fortunato Curling, DO 09/15/2022, 3:32 PM PGY-1, [facility] Family Medicine  FPTS Intern pager: [phone], text pages welcome Secure chat group [facility] Teaching Service

## 2022-09-15 NOTE — Assessment & Plan Note (Addendum)
Lactic acid elevated to 3.3>3.9>2.5>0.9 likely secondary to nausea, vomiting, diarrhea over the past couple days.  S/p 1.5 L NS and LR maintenance fluids at 125 mL/hr. Chest x-ray showed no acute cardiopulmonary abnormality.  CT abdomen pelvis with contrast shows nonspecific colitis, colonic diverticulosis with no acute diverticulitis, hepatic steatosis, and aortic atherosclerosis. - VTE prophylaxis: SCDs, d/t potential GI bleed - s/p IV Cipro and Flagyl per EDP 7/28 and one dose of PO Ciprofloxacin 500 mg and Flagyl 500 mg (7/29) - Transition to Azithromycin 500 mg daily x 3 days (7/29-7/31) - Pain regimen: Tylenol, Oxycodone 5 mg q4h PRN, Gabapentin 300 mg BID

## 2022-09-15 NOTE — Progress Notes (Signed)
PHARMACY - PHYSICIAN COMMUNICATION CRITICAL VALUE ALERT - BLOOD CULTURE IDENTIFICATION (BCID)  Marisa [name] is an 47 y.o. female who presented to [facility] on 09/14/2022 with a chief complaint of nausea/vomiting and diarrhea.   Assessment:  Bcx now growing 3/4 bottles with GPC in clusters. BCID showing staph and strep species.   Name of physician (or Provider) Contacted: Family Medicine teaching service  Current antibiotics: Azithromycin  Changes to prescribed antibiotics recommended:  Add vancomycin - consider ID consult in AM.  Results for orders placed or performed during the hospital encounter of 09/14/22  Blood Culture ID Panel (Reflexed) (Collected: 09/14/2022  2:35 PM)  Result Value Ref Range   Enterococcus faecalis NOT DETECTED NOT DETECTED   Enterococcus Faecium NOT DETECTED NOT DETECTED   Listeria monocytogenes NOT DETECTED NOT DETECTED   Staphylococcus species DETECTED (A) NOT DETECTED   Staphylococcus aureus (BCID) NOT DETECTED NOT DETECTED   Staphylococcus epidermidis NOT DETECTED NOT DETECTED   Staphylococcus lugdunensis NOT DETECTED NOT DETECTED   Streptococcus species DETECTED (A) NOT DETECTED   Streptococcus agalactiae NOT DETECTED NOT DETECTED   Streptococcus pneumoniae NOT DETECTED NOT DETECTED   Streptococcus pyogenes NOT DETECTED NOT DETECTED   A.calcoaceticus-baumannii NOT DETECTED NOT DETECTED   Bacteroides fragilis NOT DETECTED NOT DETECTED   Enterobacterales NOT DETECTED NOT DETECTED   Enterobacter cloacae complex NOT DETECTED NOT DETECTED   Escherichia coli NOT DETECTED NOT DETECTED   Klebsiella aerogenes NOT DETECTED NOT DETECTED   Klebsiella oxytoca NOT DETECTED NOT DETECTED   Klebsiella pneumoniae NOT DETECTED NOT DETECTED   Proteus species NOT DETECTED NOT DETECTED   Salmonella species NOT DETECTED NOT DETECTED   Serratia marcescens NOT DETECTED NOT DETECTED   Haemophilus influenzae NOT DETECTED NOT DETECTED   Neisseria meningitidis NOT  DETECTED NOT DETECTED   Pseudomonas aeruginosa NOT DETECTED NOT DETECTED   Stenotrophomonas maltophilia NOT DETECTED NOT DETECTED   Candida albicans NOT DETECTED NOT DETECTED   Candida auris NOT DETECTED NOT DETECTED   Candida glabrata NOT DETECTED NOT DETECTED   Candida krusei NOT DETECTED NOT DETECTED   Candida parapsilosis NOT DETECTED NOT DETECTED   Candida tropicalis NOT DETECTED NOT DETECTED   Cryptococcus neoformans/gattii NOT DETECTED NOT DETECTED    Thank you for allowing pharmacy to participate in this patient's care,  Sherron Monday, PharmD, BCCCP Clinical Pharmacist  Phone: [phone] 09/15/2022 10:36 PM  Please check AMION for all [facility] Pharmacy phone numbers After 10:00 PM, call Main Pharmacy [phone]

## 2022-09-15 NOTE — Assessment & Plan Note (Addendum)
Patient reports abdominal pain, hematemesis, hematochezia over the past several days.  FOBT positive in ED.  Hemodynamically stable, hemoglobin 14.2 and platelets 492.  Not on anticoagulation and reports infrequent use of NSAIDs.  GI was consulted in the ED and does not plan to scope at this time due to patient's hemodynamic stability and no active brisk bleed, recommended sooner outpatient follow-up with [facility]. - Reconsult GI if concern for active bleed otherwise GI will assess outpatient

## 2022-09-15 NOTE — Assessment & Plan Note (Addendum)
K+ at 3.4>3.2 on admission likely due to GI losses. - Repleted with 100 meq PO

## 2022-09-15 NOTE — Progress Notes (Signed)
Pharmacy Antibiotic Note  Marisa [name] is a 47 y.o. female admitted on 09/14/2022 with bacteremia.  Pharmacy has been consulted for bacteremia dosing.  Pt presented with nausea, vomiting, diarrhea. Bcx now growing 3/4 bottles with GPC - BCID showing staph and strep species. Received metronidazole and ciprofloxacin earlier - now on azithromycin for colitis. WBC 7.5, afeb. Scr 0.56 (CrCl 98 mL/min). LA 3.3 on admission now down to 0.9.   Plan: Vancomycin 2g IV once then 750 mg IV every 12 hours (estAUC 460, Vd 0.5) Monitor renal fx, cx results, clinical pic, and vanc levels as appropriate - consider ID consult in AM  Height: 5\' 5"  (165.1 cm) Weight: 95 kg (209 lb 7 oz) IBW/kg (Calculated) : 57  Temp (24hrs), Avg:98.1 F (36.7 C), Min:97.6 F (36.4 C), Max:98.6 F (37 C)  Recent Labs  Lab 09/14/22 1051 09/14/22 1333 09/14/22 1625 09/15/22 0012 09/15/22 1130  WBC 8.6  --   --  6.0 7.5  CREATININE 0.54  --   --  0.56  --   LATICACIDVEN  --  3.3* 3.9* 2.5* 0.9    Estimated Creatinine Clearance: 98 mL/min (by C-G formula based on SCr of 0.56 mg/dL).    Allergies  Allergen Reactions   Asa [Aspirin] Anaphylaxis and Hives    Hives, chest tightness    Mushroom Extract Complex Anaphylaxis, Swelling and Other (See Comments)    Reaction:  Eye swelling   Penicillins Anaphylaxis    Anaphylaxis ~47 yo necessitating ED visit (took pink thick liquid) - cannot recall taking any cephalosporins   Shellfish Allergy Anaphylaxis   Triamcinolone Other (See Comments)    Skin issues     Antimicrobials this admission: Cipro 7/28 >> 7/29 Metronidazole 7/28 >> 7/29 Azith 7/29 >> (7/31)  Dose adjustments this admission: N/A  Microbiology results: 7/28 BCx: 1/4 >> 3/4 Bcx GPC, BCID staph and strep species   Thank you for allowing pharmacy to participate in this patient's care,  Sherron Monday, PharmD, BCCCP Clinical Pharmacist  Phone: [phone] 09/15/2022 10:46 PM  Please check  AMION for all [facility] Pharmacy phone numbers After 10:00 PM, call Main Pharmacy [phone]

## 2022-09-15 NOTE — Progress Notes (Signed)
PHARMACY - PHYSICIAN COMMUNICATION CRITICAL VALUE ALERT - BLOOD CULTURE IDENTIFICATION (BCID)  Marisa [name] is an 47 y.o. female who presented to [facility] on 09/14/2022 with a chief complaint of nausea/vomiting and diarrhea.   Assessment:  47 year old female admitted with concerns for possible colitis. Blood cultures from 7/28 now with staph species and strep species in 1/4 bottles. This likely represents contamination.   Name of physician (or Provider) Contacted: Family Medicine teaching service   Current antibiotics: Cipro/flagyl   Changes to prescribed antibiotics recommended:  Would not change anything based on this culture   Results for orders placed or performed during the hospital encounter of 09/14/22  Blood Culture ID Panel (Reflexed) (Collected: 09/14/2022  2:35 PM)  Result Value Ref Range   Enterococcus faecalis NOT DETECTED NOT DETECTED   Enterococcus Faecium NOT DETECTED NOT DETECTED   Listeria monocytogenes NOT DETECTED NOT DETECTED   Staphylococcus species DETECTED (A) NOT DETECTED   Staphylococcus aureus (BCID) NOT DETECTED NOT DETECTED   Staphylococcus epidermidis NOT DETECTED NOT DETECTED   Staphylococcus lugdunensis NOT DETECTED NOT DETECTED   Streptococcus species DETECTED (A) NOT DETECTED   Streptococcus agalactiae NOT DETECTED NOT DETECTED   Streptococcus pneumoniae NOT DETECTED NOT DETECTED   Streptococcus pyogenes NOT DETECTED NOT DETECTED   A.calcoaceticus-baumannii NOT DETECTED NOT DETECTED   Bacteroides fragilis NOT DETECTED NOT DETECTED   Enterobacterales NOT DETECTED NOT DETECTED   Enterobacter cloacae complex NOT DETECTED NOT DETECTED   Escherichia coli NOT DETECTED NOT DETECTED   Klebsiella aerogenes NOT DETECTED NOT DETECTED   Klebsiella oxytoca NOT DETECTED NOT DETECTED   Klebsiella pneumoniae NOT DETECTED NOT DETECTED   Proteus species NOT DETECTED NOT DETECTED   Salmonella species NOT DETECTED NOT DETECTED   Serratia marcescens NOT  DETECTED NOT DETECTED   Haemophilus influenzae NOT DETECTED NOT DETECTED   Neisseria meningitidis NOT DETECTED NOT DETECTED   Pseudomonas aeruginosa NOT DETECTED NOT DETECTED   Stenotrophomonas maltophilia NOT DETECTED NOT DETECTED   Candida albicans NOT DETECTED NOT DETECTED   Candida auris NOT DETECTED NOT DETECTED   Candida glabrata NOT DETECTED NOT DETECTED   Candida krusei NOT DETECTED NOT DETECTED   Candida parapsilosis NOT DETECTED NOT DETECTED   Candida tropicalis NOT DETECTED NOT DETECTED   Cryptococcus neoformans/gattii NOT DETECTED NOT DETECTED    Sharin Mons, PharmD, BCPS, BCIDP Infectious Diseases Clinical Pharmacist Phone: [phone] 09/15/2022  10:14 AM

## 2022-09-15 NOTE — Discharge Instructions (Signed)
Marisa [name]  You were admitted to the hospital due to the bloody diarrhea that you are having and the fact that we noticed some evidence of a possible infection in your blood.  Thankfully, as best we can tell this was not a true bacterial infection of your bloodstream based on the cultures that we drew.  Since you are getting better, we can be fairly confident that this was caused by a more minor infection, possibly a virus.  The most important things for you to do moving forward are to stay hydrated, and to follow-up with your gastroenterologist because you will need a colonoscopy to follow-up on the bloody diarrhea.  If your pain or bloody diarrhea come back and you cannot control them, please do not hesitate to come back and see [name]. Please make a hospital follow-up appointment with your primary care doctor in the next 1 to 2 weeks.  The infectious disease doctor will also see you in her office in 2 weeks for repeat blood cultures to confirm that this was a contaminant.    Eliezer Mccoy, MD

## 2022-09-15 NOTE — Telephone Encounter (Signed)
-----   Message from Doree Albee sent at 09/15/2022 12:07 PM EDT ----- Regarding: Please set up a hospital follow up! Thanks! Patient of Dr. Myrtie Neither, Needs hospital follow up for likely infectious colitis.  Thanks!

## 2022-09-16 ENCOUNTER — Other Ambulatory Visit: Payer: Self-pay

## 2022-09-16 ENCOUNTER — Encounter (HOSPITAL_COMMUNITY): Payer: Self-pay | Admitting: Family Medicine

## 2022-09-16 ENCOUNTER — Inpatient Hospital Stay (HOSPITAL_COMMUNITY): Payer: Medicare HMO

## 2022-09-16 DIAGNOSIS — K529 Noninfective gastroenteritis and colitis, unspecified: Secondary | ICD-10-CM | POA: Diagnosis not present

## 2022-09-16 DIAGNOSIS — R7881 Bacteremia: Secondary | ICD-10-CM | POA: Insufficient documentation

## 2022-09-16 DIAGNOSIS — R112 Nausea with vomiting, unspecified: Secondary | ICD-10-CM

## 2022-09-16 HISTORY — DX: Nausea with vomiting, unspecified: R11.2

## 2022-09-16 MED ORDER — CAPSAICIN 0.025 % EX CREA
TOPICAL_CREAM | Freq: Two times a day (BID) | CUTANEOUS | Status: DC
  Filled 2022-09-16: qty 60

## 2022-09-16 MED ORDER — LACTATED RINGERS IV SOLN: INTRAVENOUS | Status: AC

## 2022-09-16 MED ORDER — PROCHLORPERAZINE EDISYLATE 10 MG/2ML IJ SOLN
10.0000 mg | Freq: Four times a day (QID) | INTRAMUSCULAR | Status: AC
  Administered 2022-09-16 (×3): 10 mg via INTRAVENOUS
  Filled 2022-09-16 (×3): qty 2

## 2022-09-16 MED ORDER — POTASSIUM CHLORIDE 10 MEQ/100ML IV SOLN
10.0000 meq | INTRAVENOUS | Status: AC
  Administered 2022-09-16 (×4): 10 meq via INTRAVENOUS
  Filled 2022-09-16 (×4): qty 100

## 2022-09-16 MED ORDER — ACETAMINOPHEN 10 MG/ML IV SOLN
1000.0000 mg | Freq: Four times a day (QID) | INTRAVENOUS | Status: AC
  Administered 2022-09-16 – 2022-09-17 (×4): 1000 mg via INTRAVENOUS
  Filled 2022-09-16 (×4): qty 100

## 2022-09-16 NOTE — Assessment & Plan Note (Addendum)
K+ at 3.4>3.2>3.2 likely due to GI losses. - Repleted with 6 runs of IV K - Cardiac monitoring x 12 hr

## 2022-09-16 NOTE — Progress Notes (Addendum)
Daily Progress Note Intern Pager: [phone]  Patient name: Marisa [name] Medical record number: [id number] Date of birth: 1976/01/20 Age: 47 y.o. Gender: female  Primary Care Provider: Storm Frisk, MD Consultants: ID Code Status: Full Code  Pt Overview and Major Events to Date:  7/28: Admission 7/29: Transition Ciprofloxacin to Azithromycin 7/30: Added Vanc overnight d/t Staph/Strep bacteremia  Assessment and Plan: Marisa [name] is a 47 y.o. female with past medical history of T2DM, migraine disorder, GAD, PTSD, and IBS presenting with hematemesis and hematochezia for the last 2 days. Differential for presentation of this includes Lactic acidosis secondary to N/V/D, GI bleed, colitis, gastritis, diverticulitis, and pancreatitis. Newfound staph and strep bacteremia found on blood culture.  [facility]     * (Principal) Colitis     Lactic acid elevated to 3.3>3.9>2.5>0.9 likely secondary to nausea,  vomiting, diarrhea over the past couple days.  S/p 1.5 L NS and LR  maintenance fluids at 125 mL/hr. Chest x-ray showed no acute  cardiopulmonary abnormality.  CT abdomen pelvis with contrast shows  nonspecific colitis, colonic diverticulosis with no acute diverticulitis,  hepatic steatosis, and aortic atherosclerosis. - VTE prophylaxis: SCDs, d/t potential GI bleed - s/p IV Cipro and Flagyl per EDP 7/28 and one dose of PO Ciprofloxacin  500 mg and Flagyl 500 mg (7/29) - Transition to Azithromycin 500 mg daily x 3 days (7/29-7/31) - Pain regimen: IV Tylenol, Oxycodone 5 mg q4h PRN, Gabapentin 300 mg BID         Rectal bleeding     Patient reports abdominal pain, hematemesis, hematochezia over the past  several days.  FOBT positive in ED.  Hemodynamically stable, hemoglobin  14.2 and platelets 492.  Not on anticoagulation and reports infrequent use  of NSAIDs.  GI was consulted in the ED and does not plan to scope at this  time due to patient's  hemodynamic stability and no active brisk bleed,  recommended sooner outpatient follow-up with [facility]. No episodes  during admission. - Reconsult GI if concern for active bleed otherwise GI will assess  outpatient        Chest pain     Patient complains of chest pain this morning. Unsure if this is  associated with her cough vs dry heaving episodes, but will work up  further. - EKG & CXR pending - If pain persists today, may consider Trops and CTPE d/t patient being  only on SCDs and off VTE ppx d/t episodes of bleeding upon admission.        Hypokalemia     K+ at 3.4>3.2>3.2 likely due to GI losses. - Repleted with 6 runs of IV K - Cardiac monitoring x 12 hr        Vaginal itching     Patient complains of vaginal itching and some spotting and dysuria  today. - Wet prep negative - GC/Chlamydia negative        Bacteremia     Bcx now growing 3/4 bottles with GPC in clusters - Staph capitus and  strep species. Could be due to skin contamination. - Vancomycin 2g IV once then 750 mg IV every 12 hours - Monitor renal fx, cx results, clinical pic, and vanc levels as  appropriate  - Repeat blood cultures x2 - ID on board, appreciate recs        Nausea and vomiting     Patient presented to the ED with complaints of hematemesis,  but has had  no episodes of hematemesis on admission. She continues to endorse some  nausea and has had some episodes of dry heaving while in the hospital. - LR 75 mL/hr x 12 hours - Zofran PRN and Compazine q6 x 3 doses - Capsaicin cream to upper abdomen      FEN/GI: Regular Diet PPx: SCDs Dispo: pending clinical improvement  Subjective:  Patient reports that she has more nausea and vomiting overnight.  Her abdominal pain continues to worsen.  She reported having an episode of vomiting, but she is unsure if there was blood.  Over all this morning, patient was dry heaving and spitting up sputum. She also complains of some chest pain this  morning.   Objective: Temp:  [97.8 F (36.6 C)-98.6 F (37 C)] 97.8 F (36.6 C) (07/30 0757) Pulse Rate:  [67-85] 76 (07/30 0757) Resp:  [16-17] 16 (07/29 2100) BP: (107-142)/(70-95) 129/95 (07/30 0757) SpO2:  [85 %-99 %] 96 % (07/30 0757) Physical Exam: General: Awake and alert. Ill-appearing. Mild pain and discomfort Cardiovascular: RRR. No/M/R/G Respiratory: CTAB. Normal WOB on RA. No wheezing, crackles, or rhonchi  Abdomen: soft, mild tenderness, non-distended. Normoactive bowel sounds. Extremities: no BLE edema  Laboratory: Most recent CBC Lab Results  Component Value Date   WBC 7.5 09/16/2022   HGB 12.6 09/16/2022   HCT 37.4 09/16/2022   MCV 97.7 09/16/2022   PLT 414 (H) 09/16/2022   Most recent BMP    Latest Ref Rng & Units 09/16/2022   12:21 AM  BMP  Glucose 70 - 99 mg/dL 782   BUN 6 - 20 mg/dL <5   Creatinine 9.56 - 1.00 mg/dL 2.13   Sodium 086 - 578 mmol/L 136   Potassium 3.5 - 5.1 mmol/L 3.2   Chloride 98 - 111 mmol/L 97   CO2 22 - 32 mmol/L 27   Calcium 8.9 - 10.3 mg/dL 8.1     Blood culture - Staph and Strep species in 3/4 bottles Wet prep - negative GC/Chlamydia - negative  Imaging/Diagnostic Tests: No new imaging.  Fortunato Curling, DO 09/16/2022, 12:26 PM PGY-1, [facility] Family Medicine  FPTS Intern pager: [phone], text pages welcome Secure chat group [facility] Teaching Service

## 2022-09-16 NOTE — Assessment & Plan Note (Addendum)
Lactic acid elevated to 3.3>3.9>2.5>0.9 likely secondary to nausea, vomiting, diarrhea over the past couple days.  S/p 1.5 L NS and LR maintenance fluids at 125 mL/hr. Chest x-ray showed no acute cardiopulmonary abnormality.  CT abdomen pelvis with contrast shows nonspecific colitis, colonic diverticulosis with no acute diverticulitis, hepatic steatosis, and aortic atherosclerosis. - VTE prophylaxis: SCDs, d/t potential GI bleed - s/p IV Cipro and Flagyl per EDP 7/28 and one dose of PO Ciprofloxacin 500 mg and Flagyl 500 mg (7/29) - Transition to Azithromycin 500 mg daily x 3 days (7/29-7/31) - Pain regimen: IV Tylenol, Oxycodone 5 mg q4h PRN, Gabapentin 300 mg BID

## 2022-09-16 NOTE — Plan of Care (Signed)

## 2022-09-16 NOTE — Assessment & Plan Note (Signed)
Patient complains of chest pain this morning. Unsure if this is associated with her cough vs dry heaving episodes, but will work up further. - EKG pending - If pain persists today, may consider Trops and CTPE d/t patient being only on SCDs and off VTE ppx d/t episodes of bleeding upon admission.

## 2022-09-16 NOTE — Progress Notes (Signed)
FMTS Interim Progress Note  S: She is doing much better since this morning.  Reports no episodes of nausea or emesis since this morning.  Her chest pain and shortness of breath have improved since this morning.  No other concerns at this moment, patient resting comfortably in bed.  O: BP (!) 129/95 (BP Location: Left Arm)   Pulse 76   Temp 97.8 F (36.6 C)   Resp 16   Ht 5\' 5"  (1.651 m)   Wt 95 kg   LMP 11/27/2012   SpO2 96%   BMI 34.85 kg/m    CV: RRR. No M/R/G Respiratory: CTAB. Normal WOB on RA. Still endorses some discomfort with deep breaths.   A/P: Chest Pain Improved from earlier today. Still endorses some pressure with deep breaths. - Incentive Spirometry added  Nausea and Vomiting Improved from earlier today. No new episodes since this AM.  - Continue fluids - Continue Zofran and Compazine - Capsaicin cream  Rest of plan per earlier progress note.  Fortunato Curling, DO 09/16/2022, 3:51 PM PGY-1, [facility] Family Medicine Service pager [phone]

## 2022-09-16 NOTE — Assessment & Plan Note (Signed)
AST 163>87 and ALT 143>117. Hepatitis panel negative. Patient reports that she drinks 2 to 3 glasses of wine every other day.  No history of alcohol withdrawal with tremors or seizures. Likely secondary to her symptoms upon admission.

## 2022-09-16 NOTE — Assessment & Plan Note (Addendum)
Patient reports abdominal pain, hematemesis, hematochezia over the past several days.  FOBT positive in ED.  Hemodynamically stable, hemoglobin 14.2 and platelets 492.  Not on anticoagulation and reports infrequent use of NSAIDs.  GI was consulted in the ED and does not plan to scope at this time due to patient's hemodynamic stability and no active brisk bleed, recommended sooner outpatient follow-up with [facility]. No episodes during admission. - Reconsult GI if concern for active bleed otherwise GI will assess outpatient

## 2022-09-16 NOTE — Assessment & Plan Note (Addendum)
Patient complains of chest pain yesterday likely associated with her cough and dry heaving episodes, EKG and CXR were negative and NSR w/ no ST/T wave abnormalities. Her symptoms are much improved today.

## 2022-09-16 NOTE — Assessment & Plan Note (Addendum)
Bcx now growing 3/4 bottles with GPC in clusters - Staph capitus and strep species. Could be due to skin contamination. - Vancomycin 2g IV once then 750 mg IV every 12 hours - Monitor renal fx, cx results, clinical pic, and vanc levels as appropriate  - Repeat blood cultures x2 - ID on board, appreciate recs

## 2022-09-16 NOTE — Assessment & Plan Note (Addendum)
Patient presented to the ED with complaints of hematemesis, but has had no episodes of hematemesis on admission. She continues to endorse some nausea and has had some episodes of dry heaving while in the hospital. - LR 75 mL/hr x 12 hours - Zofran PRN and Compazine q6 x 3 doses - Capsaicin cream to upper abdomen

## 2022-09-17 ENCOUNTER — Other Ambulatory Visit (HOSPITAL_COMMUNITY): Payer: Self-pay

## 2022-09-17 DIAGNOSIS — R7881 Bacteremia: Secondary | ICD-10-CM

## 2022-09-17 DIAGNOSIS — K529 Noninfective gastroenteritis and colitis, unspecified: Secondary | ICD-10-CM | POA: Diagnosis not present

## 2022-09-17 MED ORDER — ACETAMINOPHEN 325 MG PO TABS: 650.0000 mg | ORAL_TABLET | Freq: Four times a day (QID) | ORAL | Status: DC | PRN

## 2022-09-17 MED ORDER — ACETAMINOPHEN 325 MG PO TABS
650.0000 mg | ORAL_TABLET | Freq: Four times a day (QID) | ORAL | Status: DC | PRN
  Administered 2022-09-17 – 2022-09-18 (×2): 650 mg via ORAL
  Filled 2022-09-17 (×2): qty 2

## 2022-09-17 MED ORDER — POTASSIUM CHLORIDE CRYS ER 20 MEQ PO TBCR
60.0000 meq | EXTENDED_RELEASE_TABLET | Freq: Once | ORAL | Status: AC
  Administered 2022-09-17: 60 meq via ORAL
  Filled 2022-09-17: qty 3

## 2022-09-17 MED ORDER — POTASSIUM CHLORIDE CRYS ER 20 MEQ PO TBCR
40.0000 meq | EXTENDED_RELEASE_TABLET | Freq: Every day | ORAL | 0 refills | Status: DC
Start: 2022-09-17 — End: 2022-12-18
  Filled 2022-09-17: qty 30, 15d supply, fill #0

## 2022-09-17 NOTE — Consult Note (Signed)
[facility]                                                                                        Patient Identification: Patient Name: Marisa [name] MRN: [id number] Admit Date: 09/14/2022  9:50 AM Today's Date: 09/17/2022 Reason for consult: Bacteremia Requesting provider: Dr. Manson Passey  Principal Problem:   Colitis Active Problems:   Rectal bleeding   Chest pain   Hypokalemia   Vaginal itching   Bacteremia   Nausea and vomiting   Antibiotics:  Vancomycin 7/29- Ciprofloxacin 7/28-7/29 Metronidazole 7/28- 7/29  Lines/Hardware:  Assessment # Hematochezia/Nausea/Vomiting/Abdominal pain  # CT w non specific colitis  -Hematochezia has resolved including nausea and vomiting and abdominal pain. Darrhea improving only 1 episode in last 24 hrs  -Ciprofloxacin 7/28-7/29, azithromycin 7/29-7/31 - Will not send GI PCR. No recent abtx use to consider C diff - last colonoscopy in 2018 polyps removed   # Polymicrobial blood cultures -she has history of difficult IV access and reports difficulty with getting blood cultures in the ED. suspect contaminant  Recommendations  Continue vancomycin, pharmacy to dose for now  Fu repeat blood cx 7/30 Requested Micro to run sensi of Staph capitis on both sets from 7/28 Will hold on further workup until above Fu with GI   Rest of the management as per the primary team. Please call with questions or concerns.  Thank you for the consult  __________________________________________________________________________________________________________ HPI and Hospital Course: 47 year old female with DM with neuropathy, h/o cardiac arrest, obesity/OSA s/p gastric sleeve resection GERD, HLD, IBS, GAD/PTSD, including who presented to the ED 7/28 with rectal bleeding for 2 days associated with lower abdominal pain, nausea, vomiting as well as fever.  Patient has  previous history of GI bleed and was evaluated by GI where she was found to have multiple polyps that were removed.    At ED, afebrile Remarkable for K3.4, AST 143, ALT 163, lactic acid 3.3, WBC 8.6, UA with trace leukocytes Rectal exam positive for brown stool in the rectal vault, no gross melena or bleeding but hemoccult positive Was given IVF as well as ciprofloxacin and metronidazole Imaging as below  GI was consulted from ED  ID consulted for bacteremia  7/28 blood cx NG first set Staph capitis, second set with Staph capitis and strep mitis/oralis  ROS: General- Denies fever, chills, loss of appetite and loss of weight HEENT - Denies headache, blurry vision, neck pain, sinus pain Chest - Denies any chest pain, SOB or cough CVS- Denies any dizziness/lightheadedness, syncopal attacks, palpitations Abdomen-nausea and vomiting improved, hematochezia improved, diarrhea is improving Neuro - Denies any weakness, numbness, tingling sensation Psych - Denies any changes in mood irritability or depressive symptoms GU- Denies any burning, dysuria, hematuria or increased frequency of urination Skin - denies any rashes/lesions MSK - denies any joint pain/swelling or restricted ROM   Past Medical History:  Diagnosis Date   Abdominal pain 04/26/2013   Abnormal uterine bleeding (AUB) 10/11/2012   Acute bronchitis    Allergy    Anal pain    chronic   Anemia    Anxiety  Asthma    exacerbation 02-28-2014 and 02-23-2014 secondary to Rhinovirus   Atypical chest pain 04/26/2013   Benign neoplasm of sigmoid colon    Benign neoplasm of transverse colon    Carbuncle of labium 07/12/2015   Chest pain 02/20/2014   Chronic diarrhea    Chronic headaches    Chronic low back pain    Cigarette nicotine dependence without complication 05/04/2014   Cyst of right ovary    Dandruff 03/26/2015   Diabetes mellitus without complication (HCC) 10/13/2013   Diabetic neuropathy, painful (HCC) 12/13/2019    Difficult intravenous access    PER PT NEEDS PICC LINE   Dyspnea 09/07/2012   Cleda Daub 08/2012:  No obstruction by FEV1%, but probable restriction.     Falls 03/27/2014   Food allergy    Mushrooms, shellfish   GAD (generalized anxiety disorder) 05/04/2014   Gait disturbance 04/11/2014   Gait instability    GERD (gastroesophageal reflux disease)    History of adenomatous polyp of colon    History of cardiac arrest    during SVD 1992   History of ectopic pregnancy    2009-  S/P LEFT SALPINGECTOMY   History of panic attacks    Hyperlipidemia    IBS (irritable bowel syndrome)    Insomnia 03/06/2014   Joint pain    Lower extremity edema    Lumbar stenosis L4 -- L5 with bulging disk   w/ right leg weakness/ decreased mobility   Migraine variant with headache 05/09/2014   Mild obstructive sleep apnea    study 03-20-2014  no cpap recommended   Nausea and vomiting 09/16/2022   Neuromuscular disorder (HCC)    neuropathy in feet    Neuropathic pain of both legs 06/04/2016   Obesity (BMI 30-39.9) 03/05/2018   OSA (obstructive sleep apnea) 03/06/2014   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   Panniculitis 03/05/2018   Pelvic pain 07/25/2013   Persistent vomiting 04/27/2013   Pneumonia    PTSD (post-traumatic stress disorder) 05/04/2014   Rash and nonspecific skin eruption 07/12/2015   Rectal bleeding 07/25/2013   RLQ abdominal pain    S/P Total vaginal hysterectomy on 01/06/13 01/06/2013   Sleep apnea    mild no cpap   Social anxiety disorder 05/04/2014   Sore throat 02/20/2014   Stomach ulcer    Tachycardia 02/20/2014   Type 2 diabetes mellitus (HCC)    Weakness of right leg    FROM BACK PROBLEM PER PT   Past Surgical History:  Procedure Laterality Date   ABDOMINAL HYSTERECTOMY     partial   COLONOSCOPY Left 04/29/2013   Procedure: COLONOSCOPY;  Surgeon: Willis Modena, MD;  Location: [facility];  Service: Endoscopy;  Laterality: Left;   COLONOSCOPY     COLONOSCOPY  WITH PROPOFOL N/A 08/01/2016   Procedure: COLONOSCOPY WITH PROPOFOL;  Surgeon: Sherrilyn Rist, MD;  Location: [facility];  Service: Gastroenterology;  Laterality: N/A;   ECTOPIC PREGNANCY SURGERY     ESOPHAGOGASTRODUODENOSCOPY (EGD) WITH PROPOFOL N/A 11/10/2018   Procedure: ESOPHAGOGASTRODUODENOSCOPY (EGD) WITH PROPOFOL;  Surgeon: Sherrilyn Rist, MD;  Location: [facility];  Service: Gastroenterology;  Laterality: N/A;   EVALUATION UNDER ANESTHESIA WITH FISTULECTOMY N/A 04/20/2014   Procedure: EXAM UNDER ANESTHESIA ;  Surgeon: Romie Levee, MD;  Location: [facility];  Service: General;  Laterality: N/A;   FLEXIBLE SIGMOIDOSCOPY N/A 11/09/2013   Procedure: FLEXIBLE SIGMOIDOSCOPY;  Surgeon: Willis Modena, MD;  Location: [facility];  Service: Endoscopy;  Laterality: N/A;  fupa removal      LAPAROSCOPIC CHOLECYSTECTOMY  2005   LAPAROSCOPIC GASTRIC SLEEVE RESECTION N/A 12/24/2020   Procedure: LAPAROSCOPIC GASTRIC SLEEVE RESECTION;  Surgeon: Berna Bue, MD;  Location: [facility];  Service: General;  Laterality: N/A;   REFRACTIVE SURGERY     SPHINCTEROTOMY N/A 04/20/2014   Procedure:  LATERAL INTERNAL SPHINCTEROTOMY;  Surgeon: Romie Levee, MD;  Location: [facility];  Service: General;  Laterality: N/A;   TRANSTHORACIC ECHOCARDIOGRAM  12/30/2012   mild LVH/  ef 55-60%   UNILATERAL SALPINGECTOMY  2009   laparotomy left salpingectomy-- ectopic preg.   UPPER GASTROINTESTINAL ENDOSCOPY     UPPER GI ENDOSCOPY N/A 12/24/2020   Procedure: UPPER GI ENDOSCOPY;  Surgeon: Berna Bue, MD;  Location: [facility];  Service: General;  Laterality: N/A;   VAGINAL HYSTERECTOMY N/A 01/06/2013   Procedure: HYSTERECTOMY VAGINAL;  Surgeon: Tereso Newcomer, MD;  Location: [facility];  Service: Gynecology;  Laterality: N/A;    Scheduled Meds:  azithromycin  500 mg Oral Daily   capsaicin   Topical BID   gabapentin  300 mg Oral BID   sertraline  50 mg Oral Daily    Continuous Infusions:  vancomycin 750 mg (09/16/22 2300)   PRN Meds:.acetaminophen, albuterol, ondansetron **OR** ondansetron (ZOFRAN) IV, oxyCODONE  Allergies  Allergen Reactions   Asa [Aspirin] Anaphylaxis and Hives    Hives, chest tightness    Mushroom Extract Complex Anaphylaxis, Swelling and Other (See Comments)    Reaction:  Eye swelling   Penicillins Anaphylaxis    Anaphylaxis ~47 yo necessitating ED visit (took pink thick liquid) - cannot recall taking any cephalosporins   Shellfish Allergy Anaphylaxis   Triamcinolone Other (See Comments)    Skin issues    Social History   Socioeconomic History   Marital status: Married    Spouse name: Not on file   Number of children: 1   Years of education: Not on file   Highest education level: Not on file  Occupational History   Occupation: disability  Tobacco Use   Smoking status: Former    Current packs/day: 0.00    Average packs/day: 0.2 packs/day for 11.0 years (2.2 ttl pk-yrs)    Types: Cigarettes    Start date: 11/18/2002    Quit date: 11/17/2013    Years since quitting: 8.8   Smokeless tobacco: Never   Tobacco comments:    2 years quit  Vaping Use   Vaping status: Never Used  Substance and Sexual Activity   Alcohol use: No    Alcohol/week: 0.0 standard drinks of alcohol   Drug use: No   Sexual activity: Yes    Birth control/protection: Surgical  Other Topics Concern   Not on file  Social History Narrative   Lives with sister   Drinks no caffeine   Right Handed    Lives in a one story home    Social Determinants of Health   Financial Resource Strain: Low Risk  (07/01/2022)   Overall Financial Resource Strain (CARDIA)    Difficulty of Paying Living Expenses: Not hard at all  Food Insecurity: No Food Insecurity (09/14/2022)   Hunger Vital Sign    Worried About Running Out of Food in the Last Year: Never true    Ran Out of Food in the Last Year: Never true  Transportation Needs: No Transportation Needs  (09/14/2022)   PRAPARE - Administrator, Civil Service (Medical): No    Lack of Transportation (Non-Medical): No  Physical Activity: Inactive (07/01/2022)   Exercise Vital Sign    Days of Exercise per Week: 0 days    Minutes of Exercise per Session: 0 min  Stress: No Stress Concern Present (07/01/2022)   [employer] of Occupational Health - Occupational Stress Questionnaire    Feeling of Stress : Not at all  Social Connections: Moderately Isolated (07/01/2022)   Social Connection and Isolation Panel [NHANES]    Frequency of Communication with Friends and Family: More than three times a week    Frequency of Social Gatherings with Friends and Family: Three times a week    Attends Religious Services: Never    Active Member of Clubs or Organizations: No    Attends Banker Meetings: Never    Marital Status: Married  Catering manager Violence: Not At Risk (09/14/2022)   Humiliation, Afraid, Rape, and Kick questionnaire    Fear of Current or Ex-Partner: No    Emotionally Abused: No    Physically Abused: No    Sexually Abused: No   Family History  Problem Relation Age of Onset   Hypertension Mother    Diabetes Mother    Allergies Mother    Heart disease Mother    Clotting disorder Mother    Stroke Mother    Kidney disease Mother    Thyroid disease Mother    Cancer Father    Hyperlipidemia Father    Hypertension Father    Colon cancer Father    Liver disease Father    Heart disease Maternal Grandmother    Breast cancer Maternal Grandmother 16   Schizophrenia Sister    Bipolar disorder Sister    Clotting disorder Sister    Bipolar disorder Brother    Kidney disease Brother    Bipolar disorder Sister    Pancreatic cancer Maternal Aunt    Prostate cancer Maternal Uncle    Liver cancer Maternal Grandfather    Rectal cancer Maternal Grandfather    Liver cancer Paternal Grandfather    Colon polyps Neg Hx    Esophageal cancer Neg Hx    Stomach cancer  Neg Hx     Vitals BP (!) 142/99 (BP Location: Left Arm)   Pulse 78   Temp 97.8 F (36.6 C) (Oral)   Resp 18   Ht 5\' 5"  (1.651 m)   Wt 95 kg   LMP 11/27/2012   SpO2 97%   BMI 34.85 kg/m    Physical Exam Constitutional: Adult female lying in the bed and appears comfortable    Comments:   Cardiovascular:     Rate and Rhythm: Normal rate and regular rhythm.     Heart sounds: s1s2  Pulmonary:     Effort: Pulmonary effort is normal.     Comments: Normal lung sounds   Abdominal:     Palpations: Abdomen is soft.     Tenderness: non distended and nontender  Musculoskeletal:        General: No swelling or tenderness in peripheral joints  Skin:    Comments: No rashes  Neurological:     General: awake, and oriented, following commands  Psychiatric:        Mood and Affect: Mood normal.    Pertinent Microbiology Results for orders placed or performed during the hospital encounter of 09/14/22  Blood culture (routine x 2)     Status: Abnormal (Preliminary result)   Collection Time: 09/14/22  2:30 PM   Specimen: BLOOD  Result Value Ref Range Status   Specimen Description  BLOOD RIGHT ANTECUBITAL  Final   Special Requests   Final    BOTTLES DRAWN AEROBIC AND ANAEROBIC Blood Culture results may not be optimal due to an excessive volume of blood received in culture bottles   Culture  Setup Time   Final    GRAM POSITIVE COCCI IN CLUSTERS AEROBIC BOTTLE ONLY CRITICAL RESULT CALLED TO, READ BACK BY AND VERIFIED WITH: PHARMD KIM HURTH ON 09/15/22 @ 2225 BY DRT Performed at [facility], [address] [place], [place] [address]    Culture STAPHYLOCOCCUS CAPITIS (A)  Final   Report Status PENDING  Incomplete  Blood culture (routine x 2)     Status: None (Preliminary result)   Collection Time: 09/14/22  2:35 PM   Specimen: BLOOD LEFT HAND  Result Value Ref Range Status   Specimen Description BLOOD LEFT HAND  Final   Special Requests   Final    BOTTLES DRAWN AEROBIC AND  ANAEROBIC Blood Culture adequate volume   Culture  Setup Time   Final    GRAM POSITIVE COCCI IN BOTH AEROBIC AND ANAEROBIC BOTTLES CRITICAL RESULT CALLED TO, READ BACK BY AND VERIFIED WITH: PHARMD EMILY SINCLAIR [phone] 1007 BY J RAZZAK, MT    Culture   Final    GRAM POSITIVE COCCI STAPHYLOCOCCUS CAPITIS CULTURE REINCUBATED FOR BETTER GROWTH Performed at [facility], [address] [place], [place] [address]    Report Status PENDING  Incomplete  Blood Culture ID Panel (Reflexed)     Status: Abnormal   Collection Time: 09/14/22  2:35 PM  Result Value Ref Range Status   Enterococcus faecalis NOT DETECTED NOT DETECTED Final   Enterococcus Faecium NOT DETECTED NOT DETECTED Final   Listeria monocytogenes NOT DETECTED NOT DETECTED Final   Staphylococcus species DETECTED (A) NOT DETECTED Final    Comment: CRITICAL RESULT CALLED TO, READ BACK BY AND VERIFIED WITH: PHARMD EMILY SINCLAIR [id number] 1007 BY J RAZZAK, MT    Staphylococcus aureus (BCID) NOT DETECTED NOT DETECTED Final   Staphylococcus epidermidis NOT DETECTED NOT DETECTED Final   Staphylococcus lugdunensis NOT DETECTED NOT DETECTED Final   Streptococcus species DETECTED (A) NOT DETECTED Final    Comment: Not Enterococcus species, Streptococcus agalactiae, Streptococcus pyogenes, or Streptococcus pneumoniae. CRITICAL RESULT CALLED TO, READ BACK BY AND VERIFIED WITH: PHARMD EMILY SINCLAIR [id number] 1007 BY J RAZZAK, MT    Streptococcus agalactiae NOT DETECTED NOT DETECTED Final   Streptococcus pneumoniae NOT DETECTED NOT DETECTED Final   Streptococcus pyogenes NOT DETECTED NOT DETECTED Final   A.calcoaceticus-baumannii NOT DETECTED NOT DETECTED Final   Bacteroides fragilis NOT DETECTED NOT DETECTED Final   Enterobacterales NOT DETECTED NOT DETECTED Final   Enterobacter cloacae complex NOT DETECTED NOT DETECTED Final   Escherichia coli NOT DETECTED NOT DETECTED Final   Klebsiella aerogenes NOT DETECTED NOT DETECTED Final    Klebsiella oxytoca NOT DETECTED NOT DETECTED Final   Klebsiella pneumoniae NOT DETECTED NOT DETECTED Final   Proteus species NOT DETECTED NOT DETECTED Final   Salmonella species NOT DETECTED NOT DETECTED Final   Serratia marcescens NOT DETECTED NOT DETECTED Final   Haemophilus influenzae NOT DETECTED NOT DETECTED Final   Neisseria meningitidis NOT DETECTED NOT DETECTED Final   Pseudomonas aeruginosa NOT DETECTED NOT DETECTED Final   Stenotrophomonas maltophilia NOT DETECTED NOT DETECTED Final   Candida albicans NOT DETECTED NOT DETECTED Final   Candida auris NOT DETECTED NOT DETECTED Final   Candida glabrata NOT DETECTED NOT DETECTED Final   Candida krusei  NOT DETECTED NOT DETECTED Final   Candida parapsilosis NOT DETECTED NOT DETECTED Final   Candida tropicalis NOT DETECTED NOT DETECTED Final   Cryptococcus neoformans/gattii NOT DETECTED NOT DETECTED Final    Comment: Performed at [facility], [address] [place], [place] [address]  Wet prep, genital     Status: None   Collection Time: 09/15/22  4:24 PM   Specimen: Vaginal  Result Value Ref Range Status   Yeast Wet Prep HPF POC NONE SEEN NONE SEEN Final   Trich, Wet Prep NONE SEEN NONE SEEN Final   Clue Cells Wet Prep HPF POC NONE SEEN NONE SEEN Final   WBC, Wet Prep HPF POC <10 <10 Final   Sperm NONE SEEN  Final    Comment: Performed at [facility], [address], [place], [place] [address]    Pertinent Lab seen by me:    Latest Ref Rng & Units 09/16/2022   12:21 AM 09/15/2022   11:30 AM 09/15/2022   12:12 AM  CBC  WBC 4.0 - 10.5 K/uL 7.5  7.5  6.0   Hemoglobin 12.0 - 15.0 g/dL 56.4  33.2  95.1   Hematocrit 36.0 - 46.0 % 37.4  36.6  34.1   Platelets 150 - 400 K/uL 414  383  415       Latest Ref Rng & Units 09/17/2022   12:14 AM 09/16/2022   12:21 AM 09/15/2022   12:12 AM  CMP  Glucose 70 - 99 mg/dL 85  884  81   BUN 6 - 20 mg/dL <5  <5  <5   Creatinine 0.44 - 1.00 mg/dL 1.66  0.63  0.16   Sodium 135  - 145 mmol/L 135  136  139   Potassium 3.5 - 5.1 mmol/L 3.4  3.2  3.2   Chloride 98 - 111 mmol/L 101  97  105   CO2 22 - 32 mmol/L 22  27  22    Calcium 8.9 - 10.3 mg/dL 8.1  8.1  7.7   Total Protein 6.5 - 8.1 g/dL   6.4   Total Bilirubin 0.3 - 1.2 mg/dL   0.3   Alkaline Phos 38 - 126 U/L   66   AST 15 - 41 U/L   87   ALT 0 - 44 U/L   117     Pertinent Imagings/Other Imagings Plain films and CT images have been personally visualized and interpreted; radiology reports have been reviewed. Decision making incorporated into the Impression / Recommendations.  DG CHEST PORT 1 VIEW  Result Date: 09/16/2022 CLINICAL DATA:  Chest pain EXAM: PORTABLE CHEST 1 VIEW COMPARISON:  09/14/2022 FINDINGS: The heart size and mediastinal contours are within normal limits. Both lungs are clear. The visualized skeletal structures are unremarkable. IMPRESSION: No active disease. Electronically Signed   By: Sharlet Salina M.D.   On: 09/16/2022 15:50   CT ABDOMEN PELVIS W CONTRAST  Result Date: 09/14/2022 CLINICAL DATA:  Left lower quadrant abdominal pain EXAM: CT ABDOMEN AND PELVIS WITH CONTRAST TECHNIQUE: Multidetector CT imaging of the abdomen and pelvis was performed using the standard protocol following bolus administration of intravenous contrast. RADIATION DOSE REDUCTION: This exam was performed according to the departmental dose-optimization program which includes automated exposure control, adjustment of the mA and/or kV according to patient size and/or use of iterative reconstruction technique. CONTRAST:  75mL OMNIPAQUE IOHEXOL 350 MG/ML SOLN COMPARISON:  01/04/2021 FINDINGS: Lower chest: No acute abnormality. Hepatobiliary: Diffusely decreased attenuation of the  hepatic parenchyma. No focal liver lesion is identified. Status post cholecystectomy. No biliary dilatation. Pancreas: Unremarkable. No pancreatic ductal dilatation or surrounding inflammatory changes. Spleen: Normal in size without focal abnormality.  Adrenals/Urinary Tract: Unremarkable adrenal glands. Kidneys enhance symmetrically without focal lesion, stone, or hydronephrosis. Ureters are nondilated. Urinary bladder appears unremarkable for the degree of distention. Stomach/Bowel: Postsurgical changes of sleeve gastrectomy. The previously seen fluid collection adjacent to the suture line of the proximal stomach has resolved. No dilated loops of bowel. Normal appendix in the right lower quadrant. Long segment mild colonic wall thickening of the descending and sigmoid colon. Scattered colonic diverticula. No focally inflamed diverticulum is identified. Vascular/Lymphatic: Scattered aortoiliac atherosclerotic calcifications without aneurysm. No abdominopelvic lymphadenopathy. Reproductive: Status post hysterectomy. No adnexal masses. Other: No free fluid. No abdominopelvic fluid collection. No pneumoperitoneum. No abdominal wall hernia. Musculoskeletal: No acute or significant osseous findings. IMPRESSION: 1. Long segment mild colonic wall thickening of the descending and sigmoid colon, compatible with a nonspecific colitis. 2. Colonic diverticulosis without evidence of acute diverticulitis. 3. Hepatic steatosis. 4. Aortic atherosclerosis (ICD10-I70.0). Electronically Signed   By: Duanne Guess D.O.   On: 09/14/2022 14:34   DG Chest Portable 1 View  Result Date: 09/14/2022 CLINICAL DATA:  gi bleed EXAM: PORTABLE CHEST 1 VIEW COMPARISON:  January 04, 2021 FINDINGS: The cardiomediastinal silhouette is unchanged in contour. No pleural effusion. No pneumothorax. No acute pleuroparenchymal abnormality. IMPRESSION: No acute cardiopulmonary abnormality. Electronically Signed   By: Meda Klinefelter M.D.   On: 09/14/2022 13:42    I have personally spent 81 minutes involved in face-to-face and non-face-to-face activities for this patient on the day of the visit. Professional time spent includes the following activities: Preparing to see the patient (review of  tests), Obtaining and/or reviewing separately obtained history (admission/discharge record), Performing a medically appropriate examination and/or evaluation , Ordering medications/tests/procedures, referring and communicating with other health care professionals, Documenting clinical information in the EMR, Independently interpreting results (not separately reported), Communicating results to the patient/family/caregiver, Counseling and educating the patient/family/caregiver and Care coordination (not separately reported).  Electronically signed by:   Plan d/w requesting provider as well as ID pharm D  Note: This document was prepared using dragon voice recognition software and may include unintentional dictation errors.   Odette Fraction, MD Infectious Disease Physician [facility] Pager: [phone]

## 2022-09-17 NOTE — Plan of Care (Signed)

## 2022-09-17 NOTE — Assessment & Plan Note (Signed)
Patient complains of vaginal itching and some spotting and dysuria today. - Wet prep negative - GC/Chlamydia negative

## 2022-09-17 NOTE — Assessment & Plan Note (Addendum)
Patient presented to the ED with complaints of hematemesis, but has had no episodes of hematemesis on admission. She continues to endorse some nausea and has had some episodes of dry heaving while in the hospital. No more episodes of nausea, dry heaving, or emesis overnight and feels much better today. - Zofran PRN - Capsaicin cream to upper abdomen

## 2022-09-17 NOTE — Assessment & Plan Note (Addendum)
Bcx now growing 3/4 bottles with GPC in clusters - Staph capitus and strep species. Could be due to skin contamination. Repeat cultures show Strep mitis/oralis. - Vancomycin 2g IV once then 750 mg IV every 12 hours - Monitor renal fx, cx results, clinical pic, and vanc levels as appropriate  - Repeat blood cultures x2 shows strep mitis/oralis and staph capitis. - ID on board, appreciate recs

## 2022-09-17 NOTE — Assessment & Plan Note (Signed)
K+ at 3.4>3.2>3.2>3.4 likely due to GI losses. - Repleted with K 60 meq PO

## 2022-09-17 NOTE — Assessment & Plan Note (Signed)
Patient reports abdominal pain, hematemesis, hematochezia over the past several days.  FOBT positive in ED.  Hemodynamically stable, hemoglobin 14.2 and platelets 492.  Not on anticoagulation and reports infrequent use of NSAIDs.  GI was consulted in the ED and does not plan to scope at this time due to patient's hemodynamic stability and no active brisk bleed, recommended sooner outpatient follow-up with [facility]. No episodes during admission. - Reconsult GI if concern for active bleed otherwise GI will assess outpatient

## 2022-09-17 NOTE — Assessment & Plan Note (Signed)
Lactic acid elevated to 3.3>3.9>2.5>0.9 likely secondary to nausea, vomiting, diarrhea over the past couple days.  S/p 1.5 L NS and LR maintenance fluids at 125 mL/hr. Chest x-ray showed no acute cardiopulmonary abnormality.  CT abdomen pelvis with contrast shows nonspecific colitis, colonic diverticulosis with no acute diverticulitis, hepatic steatosis, and aortic atherosclerosis. Patient has had BMs during her admission and has not noticed any blood.  - VTE prophylaxis: SCDs, d/t potential GI bleed - s/p IV Cipro and Flagyl per EDP 7/28 and one dose of PO Ciprofloxacin 500 mg and Flagyl 500 mg (7/29) - Azithromycin 500 mg daily x 3 days (7/29-7/31) - Pain regimen: IV Tylenol, Oxycodone 5 mg q4h PRN, Gabapentin 300 mg BID

## 2022-09-17 NOTE — Progress Notes (Signed)
Daily Progress Note Intern Pager: [phone]  Patient name: Marisa [name] Medical record number: [id number] Date of birth: 03/03/1975 Age: 47 y.o. Gender: female  Primary Care Provider: Storm Frisk, MD Consultants: GI, ID Code Status: Full Code  Pt Overview and Major Events to Date:  7/28: Admission 7/29: Transition Ciprofloxacin to Azithromycin 7/30: Added Vanc overnight d/t Staph/Strep bacteremia, repeated blood cx 7/31: Azithromycin completed (7/29-7/31)  Assessment and Plan: Marisa [name] is a 47 y.o. female with past medical history of T2DM, migraine disorder, GAD, PTSD, and IBS presenting with hematemesis and hematochezia for the last 2 days. Differential for presentation of this includes Lactic acidosis secondary to N/V/D, GI bleed, colitis, gastritis, diverticulitis, and pancreatitis. Newfound staph and strep bacteremia found on blood culture.   [facility]     * (Principal) Colitis     Lactic acid elevated to 3.3>3.9>2.5>0.9 likely secondary to nausea,  vomiting, diarrhea over the past couple days.  S/p 1.5 L NS and LR  maintenance fluids at 125 mL/hr. Chest x-ray showed no acute  cardiopulmonary abnormality.  CT abdomen pelvis with contrast shows  nonspecific colitis, colonic diverticulosis with no acute diverticulitis,  hepatic steatosis, and aortic atherosclerosis. Patient has had BMs during  her admission and has not noticed any blood.  - VTE prophylaxis: SCDs, d/t potential GI bleed - s/p IV Cipro and Flagyl per EDP 7/28 and one dose of PO Ciprofloxacin  500 mg and Flagyl 500 mg (7/29) - Azithromycin 500 mg daily x 3 days (7/29-7/31) - Pain regimen: IV Tylenol, Oxycodone 5 mg q4h PRN, Gabapentin 300 mg BID         Rectal bleeding     Patient reports abdominal pain, hematemesis, hematochezia over the past  several days.  FOBT positive in ED.  Hemodynamically stable, hemoglobin  14.2 and platelets 492.  Not on anticoagulation and  reports infrequent use  of NSAIDs.  GI was consulted in the ED and does not plan to scope at this  time due to patient's hemodynamic stability and no active brisk bleed,  recommended sooner outpatient follow-up with [facility]. No episodes  during admission. - Reconsult GI if concern for active bleed otherwise GI will assess  outpatient        Chest pain     Patient complains of chest pain yesterday likely associated with her  cough and dry heaving episodes, EKG and CXR were negative and NSR w/ no  ST/T wave abnormalities. Her symptoms are much improved today.         Hypokalemia     K+ at 3.4>3.2>3.2>3.4 likely due to GI losses. - Repleted with K 60 meq PO        Bacteremia     Bcx now growing 3/4 bottles with GPC in clusters - Staph capitus and  strep species. Could be due to skin contamination. Repeat cultures show  Strep mitis/oralis. - Vancomycin 2g IV once then 750 mg IV every 12 hours - Monitor renal fx, cx results, clinical pic, and vanc levels as  appropriate  - Repeat blood cultures x2 shows strep mitis/oralis and staph capitis. - ID on board, appreciate recs        Nausea and vomiting     Patient presented to the ED with complaints of hematemesis, but has had  no episodes of hematemesis on admission. She continues to endorse some  nausea and has had some episodes of dry heaving while in the  hospital. No  more episodes of nausea, dry heaving, or emesis overnight and feels much  better today. - Zofran PRN - Capsaicin cream to upper abdomen      FEN/GI: Regular diet PPx: SCDs Dispo: Ending pending clinical improvement and blood cultures  Subjective:  Patient is doing much better this morning.  No complaints of nausea, dry heaving, or emesis.  She reports she had bowel movements yesterday and this morning with no blood.  She is in better spirits this morning and feels ready to go home.  Objective: Temp:  [97.7 F (36.5 C)-98.2 F (36.8 C)] 97.8 F (36.6 C)  (07/31 0803) Pulse Rate:  [74-102] 78 (07/31 0803) Resp:  [18] 18 (07/31 0803) BP: (108-142)/(73-99) 142/99 (07/31 0803) SpO2:  [95 %-100 %] 97 % (07/31 0803) Physical Exam: General: Awake and alert. NAD. Cardiovascular: RRR. No/M/R/G Respiratory: CTAB. Normal WOB on RA. No wheezing, crackles, or rhonchi  Abdomen: soft, non-tender, non-distended. Normoactive bowel sounds. Extremities: no BLE edema, pulses intact peripherally    Laboratory: Most recent CBC Lab Results  Component Value Date   WBC 7.5 09/16/2022   HGB 12.6 09/16/2022   HCT 37.4 09/16/2022   MCV 97.7 09/16/2022   PLT 414 (H) 09/16/2022   Most recent BMP    Latest Ref Rng & Units 09/17/2022   12:14 AM  BMP  Glucose 70 - 99 mg/dL 85   BUN 6 - 20 mg/dL <5   Creatinine 4.16 - 1.00 mg/dL 6.06   Sodium 301 - 601 mmol/L 135   Potassium 3.5 - 5.1 mmol/L 3.4   Chloride 98 - 111 mmol/L 101   CO2 22 - 32 mmol/L 22   Calcium 8.9 - 10.3 mg/dL 8.1     Imaging/Diagnostic Tests: Chest x-ray: No active disease  Fortunato Curling, DO 09/17/2022, 12:01 PM PGY-1, [facility] Family Medicine  FPTS Intern pager: [phone], text pages welcome Secure chat group [facility] Teaching Service

## 2022-09-18 ENCOUNTER — Other Ambulatory Visit (HOSPITAL_COMMUNITY): Payer: Self-pay

## 2022-09-18 DIAGNOSIS — K529 Noninfective gastroenteritis and colitis, unspecified: Secondary | ICD-10-CM | POA: Diagnosis not present

## 2022-09-18 MED ORDER — ONDANSETRON HCL 4 MG PO TABS
4.0000 mg | ORAL_TABLET | Freq: Four times a day (QID) | ORAL | 0 refills | Status: DC | PRN
  Filled 2022-09-18: qty 20, 5d supply, fill #0

## 2022-09-18 MED ORDER — DIPHENHYDRAMINE HCL 25 MG PO CAPS
25.0000 mg | ORAL_CAPSULE | Freq: Once | ORAL | Status: AC | PRN
  Administered 2022-09-18: 25 mg via ORAL
  Filled 2022-09-18: qty 1

## 2022-09-18 MED ORDER — POTASSIUM CHLORIDE CRYS ER 20 MEQ PO TBCR
60.0000 meq | EXTENDED_RELEASE_TABLET | Freq: Once | ORAL | Status: AC
  Administered 2022-09-18: 60 meq via ORAL
  Filled 2022-09-18: qty 3

## 2022-09-18 MED ORDER — PROCHLORPERAZINE MALEATE 10 MG PO TABS
10.0000 mg | ORAL_TABLET | Freq: Once | ORAL | Status: AC | PRN
  Administered 2022-09-18: 10 mg via ORAL
  Filled 2022-09-18: qty 1

## 2022-09-18 NOTE — Assessment & Plan Note (Addendum)
Patient reports abdominal pain, hematemesis, hematochezia over the past several days.  FOBT positive in ED.  Hemodynamically stable, hemoglobin 14.2 and platelets 492.  Not on anticoagulation and reports infrequent use of NSAIDs.  GI was consulted in the ED and does not plan to scope at this time due to patient's hemodynamic stability and no active brisk bleed, outpatient follow-up with [facility] scheduled for 12/11/22. No episodes during admission. - Reconsult GI if concern for active bleed otherwise GI will assess outpatient

## 2022-09-18 NOTE — Assessment & Plan Note (Signed)
Patient reported migraine overnight. Asked for pain medications and got oxy x3  - Compazine 10 mg and benadryl 25 mg prn ordered

## 2022-09-18 NOTE — Discharge Summary (Addendum)
Family Medicine Teaching [facility] Discharge Summary  Patient name: Marisa [name] Medical record number: [id number] Date of birth: [birth year] Age: 47 y.o. Gender: female Date of Admission: 09/14/2022  Date of Discharge: 09/18/2022 Admitting Physician: Fortunato Curling, DO  Primary Care Provider: Storm Frisk, MD Consultants: Infectious Diseases   Indication for Hospitalization: Lactic Acidosis 2/2 colitis   Brief Hospital Course:  Marisa [name] is a 47 y.o.female with a history of T2DM, GAD, PTSD, IBS, and migraine disorder who was admitted to the [facility] Medicine Teaching Service at [facility] for lactic acidosis. Her hospital course is detailed below:  Lactic Acidosis  Colitis  Lactic acid elevated to 3.3 and trended up to 3.9 likely secondary to nausea, vomiting, diarrhea prior to admission but concern for sepsis so was admitted after fluids and blood cultures obtained. Trended down to 2.5 after fluids. Chest x-ray showed no acute cardiopulmonary abnormality. CT abdomen pelvis with contrast shows nonspecific colitis, colonic diverticulosis with no acute diverticulitis. Started on IV Ciprofloxacin (7/28) and Flagyl (7/28) and was transitioned to PO ciprofloxacin (7/29), azithromycin (7/29-7/31) and flagyl (7/29). Blood cultures showed 3/4 bottles with strep and staph capitis species, likely due to contamination. Vancomycin was added (7/29-8/1). Repeat cultures demonstrated Strep mitis/oralis and staph capitis. ID was consulted who recommended repeat cultures and continuing vancomycin until discharge. Ultimately we felt that these culture results most likely represented contaminant.  Prior to discharge, ID arranged follow-up appointment and planned to get repeat blood cultures outpatient.   Rectal Bleeding Patient reported abdominal pain, hematemesis, and hematochezia over the past several days. FOBT positive in the ED, but hemodynamically stable with Hgb 14.2. GI was consulted  and due to patient's hemodynamic stability and no active brisk bleed, recommended no intervention at this time and sooner outpatient follow-up with [facility].  Elevated Liver Enzymes AST 163 and ALT 143.  Patient reports that she drinks 2 to 3 glasses of wine every other day.  No history of alcohol withdrawal with tremors or seizures. Liver enzymes down trended to AST 87 and ALT 117.   Vaginal Itching Patient reports vaginal itching and some bleeding likely due to excoriations. Wet prep and GC/Chlamydia were negative. Symptoms improved.   PCP Follow-up Recommendations: Recommend CBC/CMP at follow up. Consider further workup of her elevated liver enzymes.  Ensure outpatient GI follow up with [facility]. Scheduled for 10/24   Discharge Diagnoses/Problem List:  Principal Problem:   Colitis Active Problems:   Migraine variant with headache   Rectal bleeding   Chest pain   Hypokalemia   Bacteremia   Nausea and vomiting  Disposition: Home  Discharge Condition: Stable   Discharge Exam:  Physical Exam: General: No acute distress Cardiovascular: RRR, No murmurs appreciated Respiratory: CTAB in the bilaeral posterior lung fields. Normal Work of breathing.  Abdomen: Normoactive bowel sounds, soft, mildly tender to palpation in LUQ and LLQ, non-disteneded   Extremities: SCDs in place, No LE edema   Significant Procedures: None  Significant Labs and Imaging:  No results for input(s): "WBC", "HGB", "HCT", "PLT" in the last 48 hours. Recent Labs  Lab 09/17/22 0014 09/17/22 2340  NA 135 135  K 3.4* 3.3*  CL 101 102  CO2 22 21*  GLUCOSE 85 121*  BUN <5* <5*  CREATININE 0.61 0.64  CALCIUM 8.1* 8.7*   CT Abdomen Pelvis 7/28 FINDINGS: Lower chest: No acute abnormality.   Hepatobiliary: Diffusely decreased attenuation of the hepatic parenchyma. No focal liver lesion is identified. Status post cholecystectomy.  No biliary dilatation.   Pancreas: Unremarkable. No pancreatic  ductal dilatation or surrounding inflammatory changes.   Spleen: Normal in size without focal abnormality.   Adrenals/Urinary Tract: Unremarkable adrenal glands. Kidneys enhance symmetrically without focal lesion, stone, or hydronephrosis. Ureters are nondilated. Urinary bladder appears unremarkable for the degree of distention.   Stomach/Bowel: Postsurgical changes of sleeve gastrectomy. The previously seen fluid collection adjacent to the suture line of the proximal stomach has resolved. No dilated loops of bowel. Normal appendix in the right lower quadrant. Long segment mild colonic wall thickening of the descending and sigmoid colon. Scattered colonic diverticula. No focally inflamed diverticulum is identified.   Vascular/Lymphatic: Scattered aortoiliac atherosclerotic calcifications without aneurysm. No abdominopelvic lymphadenopathy.   Reproductive: Status post hysterectomy. No adnexal masses.   Other: No free fluid. No abdominopelvic fluid collection. No pneumoperitoneum. No abdominal wall hernia.   Musculoskeletal: No acute or significant osseous findings.  IMPRESSION: 1. Long segment mild colonic wall thickening of the descending and sigmoid colon, compatible with a nonspecific colitis. 2. Colonic diverticulosis without evidence of acute diverticulitis. 3. Hepatic steatosis. 4. Aortic atherosclerosis (ICD10-I70.0).     Electronically Signed   By: Duanne Guess D.O.   On: 09/14/2022 14:34   Results/Tests Pending at Time of Discharge:  None   Discharge Medications:  Allergies as of 09/18/2022       Reactions   Asa [aspirin] Anaphylaxis, Hives   Hives, chest tightness   Mushroom Extract Complex Anaphylaxis, Swelling, Other (See Comments)   Reaction:  Eye swelling   Penicillins Anaphylaxis   Anaphylaxis ~47 yo necessitating ED visit (took pink thick liquid) - cannot recall taking any cephalosporins   Shellfish Allergy Anaphylaxis   Triamcinolone Other (See  Comments)   Skin issues         Medication List     TAKE these medications    Accu-Chek Guide test strip Generic drug: glucose blood Use as instructed   Accu-Chek Guide w/Device Kit Check blood sugars daily   Accu-Chek Softclix Lancet Dev Kit Use to check blood sugar   Accu-Chek Softclix Lancets lancets Use as instructed   acetaminophen 325 MG tablet Commonly known as: TYLENOL Take 2 tablets (650 mg total) by mouth every 6 (six) hours as needed for mild pain, moderate pain, headache or fever.   albuterol (2.5 MG/3ML) 0.083% nebulizer solution Commonly known as: PROVENTIL Take 3 mLs (2.5 mg total) by nebulization every 6 (six) hours as needed for wheezing or shortness of breath.   albuterol 108 (90 Base) MCG/ACT inhaler Commonly known as: Proventil HFA Inhale 2 puffs into the lungs every 6 (six) hours as needed for wheezing or shortness of breath.   BARIATRIC MULTIVITAMINS/IRON PO Take 1 tablet by mouth daily.   EPINEPHrine 0.3 mg/0.3 mL Soaj injection Commonly known as: EPI-PEN Inject 0.3 mg into the muscle as needed for anaphylaxis.   gabapentin 300 MG capsule Commonly known as: NEURONTIN Take 1 capsule (300 mg total) by mouth 2 (two) times daily.   ondansetron 4 MG tablet Commonly known as: ZOFRAN Take 1 tablet (4 mg total) by mouth every 6 (six) hours as needed for nausea.   potassium chloride SA 20 MEQ tablet Commonly known as: KLOR-CON M Take 2 tablets (40 mEq total) by mouth daily.   sertraline 50 MG tablet Commonly known as: ZOLOFT Take 1 tablet (50 mg total) by mouth daily.   SUMAtriptan 50 MG tablet Commonly known as: Imitrex Take 1 tablet (50 mg total) by mouth every 2 (two)  hours as needed for migraine. May repeat in 2 hours if headache persists or recurs.       Discharge Instructions: Please refer to Patient Instructions section of EMR for full details.  Patient was counseled important signs and symptoms that should prompt return to  medical care, changes in medications, dietary instructions, activity restrictions, and follow up appointments.   Follow-Up Appointments:  Gastrology Appointment with Dr. Myrtie Neither 12/11/2022 1:40 PM    Follow-up Information     Storm Frisk, MD. Schedule an appointment as soon as possible for a visit in 2 day(s).   Specialty: Pulmonary Disease Contact information: [address] [place] [address] [phone]         Odette Fraction, MD Follow up on 10/01/2022.   Specialty: Infectious Diseases Why: You have an appt at 8:45 am please arrive 15 minutes early Contact information: [address] [place] [address] [phone]                Peterson Ao, MD 09/18/2022, 1:14 PM PGY-1, [facility] Psych[facility] Medicine    I have evaluated this patient along with Dr. Birdena Crandall and reviewed the above note, making necessary revisions.  Dorothyann Gibbs, MD 09/18/2022, 1:30 PM PGY-3, [facility] Family Medicine

## 2022-09-18 NOTE — Assessment & Plan Note (Signed)
Patient presented to the ED with complaints of hematemesis, but has had no episodes of hematemesis on admission. She continues to endorse some nausea and has had some episodes of dry heaving while in the hospital. No more episodes of nausea, dry heaving, or emesis overnight and continues to feel much better today. Doesn't have much of an appetite. Cream burns with placement - Zofran PRN - Capsaicin cream to upper abdomen

## 2022-09-18 NOTE — Care Management Important Message (Signed)
Important Message  Patient Details  Name: WELTHA HORAK MRN: [id number] Date of Birth: 1975-12-11   Medicare Important Message Given:  Yes     Berl Bonfanti Stefan Church 09/18/2022, 2:38 PM

## 2022-09-18 NOTE — Assessment & Plan Note (Signed)
K+ at 3.4>3.2>3.2>3.4/>3.3 overnight likely due to GI losses. - Repleted with K 60 meq PO - Repeat BMP

## 2022-09-18 NOTE — Assessment & Plan Note (Addendum)
Lactic acid elevated to 3.3>3.9>2.5>0.9 likely secondary to nausea, vomiting, diarrhea over the past couple days.  S/p 1.5 L NS and LR maintenance fluids at 125 mL/hr. Chest x-ray showed no acute cardiopulmonary abnormality.  CT abdomen pelvis with contrast shows nonspecific colitis, colonic diverticulosis with no acute diverticulitis, hepatic steatosis, and aortic atherosclerosis. Patient has had BMs during her admission and has not noticed any blood.  - VTE prophylaxis: SCDs, d/t potential GI bleed - s/p IV Cipro and Flagyl per EDP 7/28 and one dose of PO Ciprofloxacin 500 mg and Flagyl 500 mg (7/29) - Azithromycin 500 mg daily x 3 days (7/29-7/31) - Pain regimen: Gabapentin 300 mg BID, tylenol 650 mg prn  - Asked for oxycodone x3 5 mg q4h over 24 hours D/c'ed oxycodone today

## 2022-09-18 NOTE — Assessment & Plan Note (Addendum)
Bcx now growing 3/4 bottles with GPC in clusters - Staph capitus and strep species. Could be due to skin contamination. Repeat cultures show Strep mitis/oralis. - Vancomycin 2g IV once then 750 mg IV every 12 hours - Monitor renal fx, cx results, clinical pic, and vanc levels as appropriate  - Repeat blood cultures x2 shows strep mitis/oralis and staph capitis. - ID on board, appreciate recs, suspect the Bcx was contaminate and will follow-up with Bcx in 2 weeks outpatient, can d/c abx at discharge  - 7/30: Bcx: NG x48 hours

## 2022-09-18 NOTE — Progress Notes (Addendum)
[facility] Infectious Diseases Follow Up Note  Patient Identification: Patient Name: Marisa [name] MRN: [id number] Admit Date: 09/14/2022  9:50 AM Age: 47 y.o.Today's Date: 09/18/2022  Reason for Visit: Positive blood cultures  Principal Problem:   Colitis Active Problems:   Rectal bleeding   Chest pain   Hypokalemia   Bacteremia   Nausea and vomiting  Antibiotics:  Vancomycin 7/29- Ciprofloxacin 7/28-7/29 Metronidazole 7/28- 7/29   Lines/Hardware:  Interval Events: Continues to be afebrile Labs with no leukocytosis 2 episodes of bowel movements yesterday and 1 today  Assessment 47 year old female with DM with neuropathy, h/o cardiac arrest, obesity/OSA s/p gastric sleeve resection GERD, HLD, IBS, GAD/PTSD, including who presented to the ED 7/28 with rectal bleeding for 2 days associated with lower abdominal pain, nausea, vomiting as well as fever.   # Hematochezia/Nausea/Vomiting/Abdominal pain  # CT w non specific colitis  -Hematochezia, nausea, vomiting and abdominal pain has resolved. Loose stools improving  -Ciprofloxacin 7/28-7/29, azithromycin 7/29-7/31 -Will not send GI PCR. No recent abtx use to consider C diff - Last colonoscopy in 2018 polyps removed    # Polymicrobial blood cultures: ( staph capitis in both sets with matching seni's and strep mitis/oralis in one set only ): she has history of difficult IV access and reports difficulty with getting blood cultures in the ED. suspect contaminant.  - 7/30 Repeat blood NG in 2 days  - No known hardware of cardiac device   # Transaminitis  improving, h/o alcohol intake noted   Recommendations Will stop abtx as unlikely to be endocarditis. Fu in 2 weeks for surveillance blood cx off abtx Fu with GI.  ID will so. Please call with questions   Rest of the management as per the primary team. Thank you for the consult. Please page with pertinent questions  or concerns.  ______________________________________________________________________ Subjective patient seen and examined at the bedside.  2 episodes of loose stool yesterday and 1 today.  Denies nausea vomiting abdominal pain but reports may be ready to go home tomorrow.   Vitals BP (!) 121/91 (BP Location: Left Arm)   Pulse 88   Temp 98.2 F (36.8 C) (Oral)   Resp 16   Ht 5\' 5"  (1.651 m)   Wt 95 kg   LMP 11/27/2012   SpO2 100%   BMI 34.85 kg/m     Physical Exam Constitutional: adult female sitting in the bed and appears comfortable    Comments:   Cardiovascular:     Rate and Rhythm: Normal rate and regular rhythm.     Heart sounds:    Pulmonary:     Effort: Pulmonary effort is normal.     Comments:   Abdominal:     Palpations: Abdomen is soft.     Tenderness: Distended and nontender  Musculoskeletal:        General: No swelling or tenderness peripheral joints  Skin:    Comments: No rashes  Neurological:     General: awake, alert and oriented, following commands   Psychiatric:        Mood and Affect: Mood normal.   Pertinent Microbiology Results for orders placed or performed during the hospital encounter of 09/14/22  Blood culture (routine x 2)     Status: Abnormal   Collection Time: 09/14/22  2:30 PM   Specimen: BLOOD  Result Value Ref Range Status   Specimen Description BLOOD RIGHT ANTECUBITAL  Final   Special Requests   Final    BOTTLES DRAWN AEROBIC AND ANAEROBIC  Blood Culture results may not be optimal due to an excessive volume of blood received in culture bottles   Culture  Setup Time   Final    GRAM POSITIVE COCCI IN CLUSTERS AEROBIC BOTTLE ONLY CRITICAL RESULT CALLED TO, READ BACK BY AND VERIFIED WITH: PHARMD KIM HURTH ON 09/15/22 @ 2225 BY DRT Performed at [facility], [address] [place], [place] [address]    Culture STAPHYLOCOCCUS CAPITIS (A)  Final   Report Status 09/17/2022 FINAL  Final   Organism ID, Bacteria STAPHYLOCOCCUS  CAPITIS  Final      Susceptibility   Staphylococcus capitis - MIC*    CIPROFLOXACIN <=0.5 SENSITIVE Sensitive     ERYTHROMYCIN <=0.25 SENSITIVE Sensitive     GENTAMICIN <=0.5 SENSITIVE Sensitive     OXACILLIN <=0.25 SENSITIVE Sensitive     TETRACYCLINE <=1 SENSITIVE Sensitive     VANCOMYCIN <=0.5 SENSITIVE Sensitive     TRIMETH/SULFA <=10 SENSITIVE Sensitive     CLINDAMYCIN <=0.25 SENSITIVE Sensitive     RIFAMPIN <=0.5 SENSITIVE Sensitive     Inducible Clindamycin NEGATIVE Sensitive     * STAPHYLOCOCCUS CAPITIS  Blood culture (routine x 2)     Status: Abnormal   Collection Time: 09/14/22  2:35 PM   Specimen: BLOOD LEFT HAND  Result Value Ref Range Status   Specimen Description BLOOD LEFT HAND  Final   Special Requests   Final    BOTTLES DRAWN AEROBIC AND ANAEROBIC Blood Culture adequate volume   Culture  Setup Time   Final    GRAM POSITIVE COCCI IN BOTH AEROBIC AND ANAEROBIC BOTTLES CRITICAL RESULT CALLED TO, READ BACK BY AND VERIFIED WITH: PHARMD EMILY SINCLAIR [id number] 1007 BY Berline Chough, [place] Performed at [facility], [address], [place], [place] [address]    Culture (A)  Final    STREPTOCOCCUS MITIS/ORALIS THE SIGNIFICANCE OF ISOLATING THIS ORGANISM FROM A SINGLE SET OF BLOOD CULTURES WHEN MULTIPLE SETS ARE DRAWN IS UNCERTAIN. PLEASE NOTIFY THE MICROBIOLOGY DEPARTMENT WITHIN ONE WEEK IF SPECIATION AND SENSITIVITIES ARE REQUIRED. STAPHYLOCOCCUS CAPITIS    Report Status 09/18/2022 FINAL  Final   Organism ID, Bacteria STAPHYLOCOCCUS CAPITIS  Final      Susceptibility   Staphylococcus capitis - MIC*    CIPROFLOXACIN <=0.5 SENSITIVE Sensitive     ERYTHROMYCIN <=0.25 SENSITIVE Sensitive     GENTAMICIN <=0.5 SENSITIVE Sensitive     OXACILLIN <=0.25 SENSITIVE Sensitive     TETRACYCLINE <=1 SENSITIVE Sensitive     VANCOMYCIN 1 SENSITIVE Sensitive     TRIMETH/SULFA <=10 SENSITIVE Sensitive     CLINDAMYCIN <=0.25 SENSITIVE Sensitive     RIFAMPIN <=0.5 SENSITIVE Sensitive      Inducible Clindamycin NEGATIVE Sensitive     * STAPHYLOCOCCUS CAPITIS  Blood Culture ID Panel (Reflexed)     Status: Abnormal   Collection Time: 09/14/22  2:35 PM  Result Value Ref Range Status   Enterococcus faecalis NOT DETECTED NOT DETECTED Final   Enterococcus Faecium NOT DETECTED NOT DETECTED Final   Listeria monocytogenes NOT DETECTED NOT DETECTED Final   Staphylococcus species DETECTED (A) NOT DETECTED Final    Comment: CRITICAL RESULT CALLED TO, READ BACK BY AND VERIFIED WITH: PHARMD EMILY SINCLAIR [id number] 1007 BY J RAZZAK, [place]    Staphylococcus aureus (BCID) NOT DETECTED NOT DETECTED Final   Staphylococcus epidermidis NOT DETECTED NOT DETECTED Final   Staphylococcus lugdunensis NOT DETECTED NOT DETECTED Final   Streptococcus species DETECTED (A) NOT DETECTED Final    Comment:  Not Enterococcus species, Streptococcus agalactiae, Streptococcus pyogenes, or Streptococcus pneumoniae. CRITICAL RESULT CALLED TO, READ BACK BY AND VERIFIED WITH: PHARMD EMILY SINCLAIR [phone] 1007 BY J RAZZAK, MT    Streptococcus agalactiae NOT DETECTED NOT DETECTED Final   Streptococcus pneumoniae NOT DETECTED NOT DETECTED Final   Streptococcus pyogenes NOT DETECTED NOT DETECTED Final   A.calcoaceticus-baumannii NOT DETECTED NOT DETECTED Final   Bacteroides fragilis NOT DETECTED NOT DETECTED Final   Enterobacterales NOT DETECTED NOT DETECTED Final   Enterobacter cloacae complex NOT DETECTED NOT DETECTED Final   Escherichia coli NOT DETECTED NOT DETECTED Final   Klebsiella aerogenes NOT DETECTED NOT DETECTED Final   Klebsiella oxytoca NOT DETECTED NOT DETECTED Final   Klebsiella pneumoniae NOT DETECTED NOT DETECTED Final   Proteus species NOT DETECTED NOT DETECTED Final   Salmonella species NOT DETECTED NOT DETECTED Final   Serratia marcescens NOT DETECTED NOT DETECTED Final   Haemophilus influenzae NOT DETECTED NOT DETECTED Final   Neisseria meningitidis NOT DETECTED NOT DETECTED Final    Pseudomonas aeruginosa NOT DETECTED NOT DETECTED Final   Stenotrophomonas maltophilia NOT DETECTED NOT DETECTED Final   Candida albicans NOT DETECTED NOT DETECTED Final   Candida auris NOT DETECTED NOT DETECTED Final   Candida glabrata NOT DETECTED NOT DETECTED Final   Candida krusei NOT DETECTED NOT DETECTED Final   Candida parapsilosis NOT DETECTED NOT DETECTED Final   Candida tropicalis NOT DETECTED NOT DETECTED Final   Cryptococcus neoformans/gattii NOT DETECTED NOT DETECTED Final    Comment: Performed at [facility], [address], [place], [place] [address]  Wet prep, genital     Status: None   Collection Time: 09/15/22  4:24 PM   Specimen: Vaginal  Result Value Ref Range Status   Yeast Wet Prep HPF POC NONE SEEN NONE SEEN Final   Trich, Wet Prep NONE SEEN NONE SEEN Final   Clue Cells Wet Prep HPF POC NONE SEEN NONE SEEN Final   WBC, Wet Prep HPF POC <10 <10 Final   Sperm NONE SEEN  Final    Comment: Performed at [facility], [address] [place], [place] [address]  Culture, blood (Routine X 2) w Reflex to ID Panel     Status: None (Preliminary result)   Collection Time: 09/16/22 11:13 AM   Specimen: BLOOD  Result Value Ref Range Status   Specimen Description BLOOD BLOOD LEFT HAND  Final   Special Requests   Final    BOTTLES DRAWN AEROBIC AND ANAEROBIC Blood Culture adequate volume   Culture   Final    NO GROWTH 2 DAYS Performed at [facility], [address], [place], [place] [address]    Report Status PENDING  Incomplete  Culture, blood (Routine X 2) w Reflex to ID Panel     Status: None (Preliminary result)   Collection Time: 09/16/22 11:23 AM   Specimen: BLOOD  Result Value Ref Range Status   Specimen Description BLOOD BLOOD RIGHT HAND  Final   Special Requests   Final    BOTTLES DRAWN AEROBIC AND ANAEROBIC Blood Culture adequate volume   Culture   Final    NO GROWTH 2 DAYS Performed at [facility], [address], [place],  [place] [address]    Report Status PENDING  Incomplete   Pertinent Lab.    Latest Ref Rng & Units 09/16/2022   12:21 AM 09/15/2022   11:30 AM 09/15/2022   12:12 AM  CBC  WBC 4.0 -  10.5 K/uL 7.5  7.5  6.0   Hemoglobin 12.0 - 15.0 g/dL 16.1  09.6  04.5   Hematocrit 36.0 - 46.0 % 37.4  36.6  34.1   Platelets 150 - 400 K/uL 414  383  415       Latest Ref Rng & Units 09/17/2022   11:40 PM 09/17/2022   12:14 AM 09/16/2022   12:21 AM  CMP  Glucose 70 - 99 mg/dL 409  85  811   BUN 6 - 20 mg/dL <5  <5  <5   Creatinine 0.44 - 1.00 mg/dL 9.14  7.82  9.56   Sodium 135 - 145 mmol/L 135  135  136   Potassium 3.5 - 5.1 mmol/L 3.3  3.4  3.2   Chloride 98 - 111 mmol/L 102  101  97   CO2 22 - 32 mmol/L 21  22  27    Calcium 8.9 - 10.3 mg/dL 8.7  8.1  8.1      Pertinent Imaging today Plain films and CT images have been personally visualized and interpreted; radiology reports have been reviewed. Decision making incorporated into the Impression  No results found.   I have personally spent 50 minutes involved in face-to-face and non-face-to-face activities for this patient on the day of the visit. Professional time spent includes the following activities: Preparing to see the patient (review of tests), Obtaining and/or reviewing separately obtained history (admission/discharge record), Performing a medically appropriate examination and/or evaluation , Ordering medications/tests/procedures, referring and communicating with other health care professionals, Documenting clinical information in the EMR, Independently interpreting results (not separately reported), Communicating results to the patient/family/caregiver, Counseling and educating the patient/family/caregiver and Care coordination (not separately reported).   Plan d/w requesting provider as well as ID pharm D  Note: This document was prepared using dragon voice recognition software and may include unintentional dictation errors.   Electronically signed  by:   Odette Fraction, MD Infectious Disease Physician [facility] for Infectious Disease Pager: [phone]

## 2022-09-18 NOTE — Plan of Care (Signed)

## 2022-09-18 NOTE — Assessment & Plan Note (Addendum)
Patient complained of chest pain on 7/30 likely associated with her cough and dry heaving episodes, EKG and CXR were negative and NSR w/ no ST/T wave abnormalities. Patient denies any dry heaving and chest pain continues to be greatly improved.

## 2022-09-19 ENCOUNTER — Telehealth: Payer: Self-pay

## 2022-09-19 NOTE — Transitions of Care (Post Inpatient/ED Visit) (Signed)
09/19/2022  Name: Marisa [name] MRN: [id number] DOB: 1975/11/13  Today's TOC FU Call Status: Today's TOC FU Call Status:: Successful TOC FU Call Completed TOC FU Call Complete Date: 09/19/22  Transition Care Management Follow-up Telephone Call Discharge Facility: [facility]) Type of Discharge: Inpatient Admission Primary Inpatient Discharge Diagnosis:: "acute GI bleeding" How have you been since you were released from the hospital?: Better (Pt states she is feeling better-rested fairly well last night. She has had no N&V since coming home-eating small amts of food-"nibbling"-currently cooking meal. BM yesterday.) Any questions or concerns?: No  Items Reviewed: Did you receive and understand the discharge instructions provided?: Yes Medications obtained,verified, and reconciled?: Yes (Medications Reviewed) Any new allergies since your discharge?: No Dietary orders reviewed?: Yes Type of Diet Ordered:: low salt/heart healthy/carb modified Do you have support at home?: Yes People in Home: spouse Name of Support/Comfort Primary Source: Kevin Fenton  Medications Reviewed Today: Medications Reviewed Today     Reviewed by Charlyn Minerva, RN (Registered Nurse) on 09/19/22 at 1340  Med List Status: <None>   Medication Order Taking? Sig Documenting Provider Last Dose Status Informant  Accu-Chek Softclix Lancets lancets [id number] Yes Use as instructed Storm Frisk, MD Taking Active Self  acetaminophen (TYLENOL) 325 MG tablet [id number] Yes Take 2 tablets (650 mg total) by mouth every 6 (six) hours as needed for mild pain, moderate pain, headache or fever. Westley Chandler, MD Taking Active   albuterol (PROVENTIL HFA) [phone] Base) MCG/ACT inhaler [id number] Yes Inhale 2 puffs into the lungs every 6 (six) hours as needed for wheezing or shortness of breath. Storm Frisk, MD Taking Active Self  albuterol (PROVENTIL) (2.5 MG/3ML) 0.083% nebulizer solution [id number] Yes  Take 3 mLs (2.5 mg total) by nebulization every 6 (six) hours as needed for wheezing or shortness of breath. Storm Frisk, MD Taking Active Self  Blood Glucose Monitoring Suppl (ACCU-CHEK GUIDE) w/Device Andria Rhein [id number] Yes Check blood sugars daily Storm Frisk, MD Taking Active Self  EPINEPHrine 0.3 mg/0.3 mL IJ SOAJ injection [id number]  Inject 0.3 mg into the muscle as needed for anaphylaxis. Storm Frisk, MD  Active Self  gabapentin (NEURONTIN) 300 MG capsule [id number] Yes Take 1 capsule (300 mg total) by mouth 2 (two) times daily. Storm Frisk, MD Taking Active Self  glucose blood (ACCU-CHEK GUIDE) test strip [id number] Yes Use as instructed Storm Frisk, MD Taking Active Self  Lancets Misc. (ACCU-CHEK SOFTCLIX LANCET DEV) Andria Rhein [id number] Yes Use to check blood sugar Storm Frisk, MD Taking Active Self  Multiple Vitamins-Minerals (BARIATRIC MULTIVITAMINS/IRON PO) [id number] Yes Take 1 tablet by mouth daily. [provider] Taking Active Self  ondansetron (ZOFRAN) 4 MG tablet [id number] Yes Take 1 tablet (4 mg total) by mouth every 6 (six) hours as needed for nausea. Alicia Amel, MD Taking Active   potassium chloride SA (KLOR-CON M) 20 MEQ tablet [id number] Yes Take 2 tablets (40 mEq total) by mouth daily. Westley Chandler, MD Taking Active   sertraline (ZOLOFT) 50 MG tablet [id number] Yes Take 1 tablet (50 mg total) by mouth daily. Storm Frisk, MD Taking Active Self  SUMAtriptan (IMITREX) 50 MG tablet [id number] Yes Take 1 tablet (50 mg total) by mouth every 2 (two) hours as needed for migraine. May repeat in 2 hours if headache persists or recurs. Storm Frisk, MD Taking Active Self            Home Care and Equipment/Supplies: Were  Home Health Services Ordered?: NA Any new equipment or medical supplies ordered?: NA  Functional Questionnaire: Do you need assistance with bathing/showering or dressing?: No Do you need assistance with meal  preparation?: No Do you need assistance with eating?: No Do you have difficulty maintaining continence: No Do you need assistance with getting out of bed/getting out of a chair/moving?: No Do you have difficulty managing or taking your medications?: No  Follow up appointments reviewed: PCP Follow-up appointment confirmed?: Yes Date of PCP follow-up appointment?: 10/02/22 Follow-up Provider: Dr. Delford Field Specialist [facility] Follow-up appointment confirmed?: Yes Date of Specialist follow-up appointment?: 10/01/22 Follow-Up Specialty Provider:: Dr. Lowella Fairy), Dr. Danis-12/11/22-(GI MD)-pt states this is earliest appt available-will call office to get on cancellation/wait list Do you need transportation to your follow-up appointment?: No Do you understand care options if your condition(s) worsen?: Yes-patient verbalized understanding  SDOH Interventions Today    Flowsheet Row Most Recent Value  SDOH Interventions   Food Insecurity Interventions Intervention Not Indicated  Transportation Interventions Intervention Not Indicated      Interventions Today    Flowsheet Row Most Recent Value  General Interventions   General Interventions Discussed/Reviewed General Interventions Discussed, Doctor Visits, Durable Medical Equipment (DME)  Doctor Visits Discussed/Reviewed Doctor Visits Discussed, Specialist, PCP  Durable Medical Equipment (DME) Glucomoter  [pt confirms she has meter in the home-has not checked cbg since returing home but will do so shortly]  PCP/Specialist Visits Compliance with follow-up visit  Education Interventions   Education Provided Provided Education  Provided Verbal Education On Nutrition, When to see the doctor, Other  [GI sx mgmt]  Nutrition Interventions   Nutrition Discussed/Reviewed Nutrition Discussed, Adding fruits and vegetables, Increasing proteins, Decreasing sugar intake, Decreasing salt, Decreasing fats  Pharmacy Interventions   Pharmacy  Dicussed/Reviewed Pharmacy Topics Discussed, Medications and their functions  Safety Interventions   Safety Discussed/Reviewed Safety Discussed      TOC Interventions Today    Flowsheet Row Most Recent Value  TOC Interventions   TOC Interventions Discussed/Reviewed TOC Interventions Discussed, Arranged PCP follow up less than 12 days/Care Guide scheduled      Alessandra Grout [facility] Care Management Care Management Community Coordinator Direct Phone: [phone] Toll Free: [phone] Fax: [phone]

## 2022-10-01 ENCOUNTER — Inpatient Hospital Stay: Payer: Medicare HMO | Admitting: Infectious Diseases

## 2022-10-01 NOTE — Progress Notes (Deleted)
Established Patient Office Visit  Subjective:  Patient ID: Marisa [name], female    DOB: [birth year]  Age: 47 y.o. MRN: [id number]  CC:  No chief complaint on file.   HPI 02/2021 Marisa [name] is a 47 y.o. female who presents for follow up visit today. She has noticed some worsening of her neuropathy recently, with pain in her right leg and foot. She notes that her carpal tunnel syndrome in her right hand has worsened over the past six months as well which concerns her. She takes Gabapentin for this which helps. Despite these symptoms, her diabetes appears markedly improved, with her A1C at today's visit at 5.4% and her point of care blood sugar at 104 mg/dL. She is not taking any medications for diabetes at this point in time.   Of note, Marisa [name] had a gastrectomy sleeve bariatric surgery procedure on 12/24/2020. She states she is doing well at this point post-operatively and she has followed up with her surgeon and nutritionist. She continues to take ondansetron for nausea. She says she mostly needs this when she is taking the vitamin supplements that were given to her after surgery, as the vitamins make her nauseous. She would like refills on that today. She has also had some issues with constipation since her surgery. She has a very hard bowel movement every other day and she has to strain. Her bowel movements are painful and are accompanied by rectal bleeding. She says she is drinking lots of fluids and has been taking Miralax mixed in water for this.  Marisa [name]'s migraine headaches have not improved since she was last seen in office. She continues to have bad headaches about every other day, which she says are very painful and require her to lie down. Topomax was prescribed for this condition, but does not seem to improve her symptoms at all.   She states that she has been much more anxious lately. She says that the Trazadone helps her with sleep, but she would like something  else to help her manage her anxiety better.  [name] is also concerned about a 1 cm lung nodule that was found incidentally on CT on 01/04/2021 during a post op ED visit for nausea and chest pain  CT Angio was done to r/o PE and nodule found incidentally.  She is a former smoker and has had exposure to secondhand smoke. She is due for another CT scan of the lung at this time.    09/04/22 Patient is seen last visit was January of last year the patient has had increased anxiety and nervousness precipitated by the loss of her mother recently.  The mother was in hospice she is receiving grief counseling from hospice.  She does have asthma with no exacerbations.  She has migraines and needs refills on migraine medication.  She does have a history of diabetes not on medication.  Last A1c was normal.  She has been treating this with diet alone.  She does have elevated cholesterol controlled with diet alone  8/15 Date of Admission: 09/14/2022                          Date of Discharge: 09/18/2022 Admitting Physician: Fortunato Curling, DO   Primary Care Provider: Storm Frisk, MD Consultants: Infectious Diseases    Indication for Hospitalization: Lactic Acidosis 2/2 colitis    Brief Hospital Course:  Marisa [name] is a 47 y.o.female with a history of T2DM, GAD,  PTSD, IBS, and migraine disorder who was admitted to the [facility] Teaching Service at [facility] for lactic acidosis. Her hospital course is detailed below:   Lactic Acidosis  Colitis  Lactic acid elevated to 3.3 and trended up to 3.9 likely secondary to nausea, vomiting, diarrhea prior to admission but concern for sepsis so was admitted after fluids and blood cultures obtained. Trended down to 2.5 after fluids. Chest x-ray showed no acute cardiopulmonary abnormality. CT abdomen pelvis with contrast shows nonspecific colitis, colonic diverticulosis with no acute diverticulitis. Started on IV Ciprofloxacin (7/28) and Flagyl (7/28) and was  transitioned to PO ciprofloxacin (7/29), azithromycin (7/29-7/31) and flagyl (7/29). Blood cultures showed 3/4 bottles with strep and staph capitis species, likely due to contamination. Vancomycin was added (7/29-8/1). Repeat cultures demonstrated Strep mitis/oralis and staph capitis. ID was consulted who recommended repeat cultures and continuing vancomycin until discharge. Ultimately we felt that these culture results most likely represented contaminant.  Prior to discharge, ID arranged follow-up appointment and planned to get repeat blood cultures outpatient.    Rectal Bleeding Patient reported abdominal pain, hematemesis, and hematochezia over the past several days. FOBT positive in the ED, but hemodynamically stable with Hgb 14.2. GI was consulted and due to patient's hemodynamic stability and no active brisk bleed, recommended no intervention at this time and sooner outpatient follow-up with [facility].   Elevated Liver Enzymes AST 163 and ALT 143.  Patient reports that she drinks 2 to 3 glasses of wine every other day.  No history of alcohol withdrawal with tremors or seizures. Liver enzymes down trended to AST 87 and ALT 117.    Vaginal Itching Patient reports vaginal itching and some bleeding likely due to excoriations. Wet prep and GC/Chlamydia were negative. Symptoms improved.     PCP Follow-up Recommendations: Recommend CBC/CMP at follow up. Consider further workup of her elevated liver enzymes.  Ensure outpatient GI follow up with [facility]. Scheduled for 10/24     Discharge Diagnoses/Problem List:  Principal Problem:   Colitis Active Problems:   Migraine variant with headache   Rectal bleeding   Chest pain   Hypokalemia   Bacteremia   Nausea and vomiting   Past Medical History:  Diagnosis Date   Abdominal pain 04/26/2013   Abnormal uterine bleeding (AUB) 10/11/2012   Acute bronchitis    Allergy    Anal pain    chronic   Anemia    Anxiety    Asthma     exacerbation 02-28-2014 and 02-23-2014 secondary to Rhinovirus   Atypical chest pain 04/26/2013   Benign neoplasm of sigmoid colon    Benign neoplasm of transverse colon    Carbuncle of labium 07/12/2015   Chest pain 02/20/2014   Chronic diarrhea    Chronic headaches    Chronic low back pain    Cigarette nicotine dependence without complication 05/04/2014   Cyst of right ovary    Dandruff 03/26/2015   Diabetes mellitus without complication (HCC) 10/13/2013   Diabetic neuropathy, painful (HCC) 12/13/2019   Difficult intravenous access    PER PT NEEDS PICC LINE   Dyspnea 09/07/2012   Cleda Daub 08/2012:  No obstruction by FEV1%, but probable restriction.     Falls 03/27/2014   Food allergy    Mushrooms, shellfish   GAD (generalized anxiety disorder) 05/04/2014   Gait disturbance 04/11/2014   Gait instability    GERD (gastroesophageal reflux disease)    History of adenomatous polyp of colon    History of cardiac arrest  during SVD 1992   History of ectopic pregnancy    2009-  S/P LEFT SALPINGECTOMY   History of panic attacks    Hyperlipidemia    IBS (irritable bowel syndrome)    Insomnia 03/06/2014   Joint pain    Lower extremity edema    Lumbar stenosis L4 -- L5 with bulging disk   w/ right leg weakness/ decreased mobility   Migraine variant with headache 05/09/2014   Mild obstructive sleep apnea    study 03-20-2014  no cpap recommended   Nausea and vomiting 09/16/2022   Neuromuscular disorder (HCC)    neuropathy in feet    Neuropathic pain of both legs 06/04/2016   Obesity (BMI 30-39.9) 03/05/2018   OSA (obstructive sleep apnea) 03/06/2014   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   Panniculitis 03/05/2018   Pelvic pain 07/25/2013   Persistent vomiting 04/27/2013   Pneumonia    PTSD (post-traumatic stress disorder) 05/04/2014   Rash and nonspecific skin eruption 07/12/2015   Rectal bleeding 07/25/2013   RLQ abdominal pain    S/P Total vaginal hysterectomy on  01/06/13 01/06/2013   Sleep apnea    mild no cpap   Social anxiety disorder 05/04/2014   Sore throat 02/20/2014   Stomach ulcer    Tachycardia 02/20/2014   Type 2 diabetes mellitus (HCC)    Weakness of right leg    FROM BACK PROBLEM PER PT    Past Surgical History:  Procedure Laterality Date   ABDOMINAL HYSTERECTOMY     partial   COLONOSCOPY Left 04/29/2013   Procedure: COLONOSCOPY;  Surgeon: Willis Modena, MD;  Location: [facility];  Service: Endoscopy;  Laterality: Left;   COLONOSCOPY     COLONOSCOPY WITH PROPOFOL N/A 08/01/2016   Procedure: COLONOSCOPY WITH PROPOFOL;  Surgeon: Sherrilyn Rist, MD;  Location: [facility];  Service: Gastroenterology;  Laterality: N/A;   ECTOPIC PREGNANCY SURGERY     ESOPHAGOGASTRODUODENOSCOPY (EGD) WITH PROPOFOL N/A 11/10/2018   Procedure: ESOPHAGOGASTRODUODENOSCOPY (EGD) WITH PROPOFOL;  Surgeon: Sherrilyn Rist, MD;  Location: [facility];  Service: Gastroenterology;  Laterality: N/A;   EVALUATION UNDER ANESTHESIA WITH FISTULECTOMY N/A 04/20/2014   Procedure: EXAM UNDER ANESTHESIA ;  Surgeon: Romie Levee, MD;  Location: [facility];  Service: General;  Laterality: N/A;   FLEXIBLE SIGMOIDOSCOPY N/A 11/09/2013   Procedure: FLEXIBLE SIGMOIDOSCOPY;  Surgeon: Willis Modena, MD;  Location: [facility];  Service: Endoscopy;  Laterality: N/A;   fupa removal      LAPAROSCOPIC CHOLECYSTECTOMY  2005   LAPAROSCOPIC GASTRIC SLEEVE RESECTION N/A 12/24/2020   Procedure: LAPAROSCOPIC GASTRIC SLEEVE RESECTION;  Surgeon: Berna Bue, MD;  Location: [facility];  Service: General;  Laterality: N/A;   REFRACTIVE SURGERY     SPHINCTEROTOMY N/A 04/20/2014   Procedure:  LATERAL INTERNAL SPHINCTEROTOMY;  Surgeon: Romie Levee, MD;  Location: [facility];  Service: General;  Laterality: N/A;   TRANSTHORACIC ECHOCARDIOGRAM  12/30/2012   mild LVH/  ef 55-60%   UNILATERAL SALPINGECTOMY  2009   laparotomy left salpingectomy--  ectopic preg.   UPPER GASTROINTESTINAL ENDOSCOPY     UPPER GI ENDOSCOPY N/A 12/24/2020   Procedure: UPPER GI ENDOSCOPY;  Surgeon: Berna Bue, MD;  Location: [facility];  Service: General;  Laterality: N/A;   VAGINAL HYSTERECTOMY N/A 01/06/2013   Procedure: HYSTERECTOMY VAGINAL;  Surgeon: Tereso Newcomer, MD;  Location: [facility];  Service: Gynecology;  Laterality: N/A;    Family History  Problem Relation Age of Onset  Hypertension Mother    Diabetes Mother    Allergies Mother    Heart disease Mother    Clotting disorder Mother    Stroke Mother    Kidney disease Mother    Thyroid disease Mother    Cancer Father    Hyperlipidemia Father    Hypertension Father    Colon cancer Father    Liver disease Father    Heart disease Maternal Grandmother    Breast cancer Maternal Grandmother 31   Schizophrenia Sister    Bipolar disorder Sister    Clotting disorder Sister    Bipolar disorder Brother    Kidney disease Brother    Bipolar disorder Sister    Pancreatic cancer Maternal Aunt    Prostate cancer Maternal Uncle    Liver cancer Maternal Grandfather    Rectal cancer Maternal Grandfather    Liver cancer Paternal Grandfather    Colon polyps Neg Hx    Esophageal cancer Neg Hx    Stomach cancer Neg Hx     Social History   Socioeconomic History   Marital status: Married    Spouse name: Not on file   Number of children: 1   Years of education: Not on file   Highest education level: Not on file  Occupational History   Occupation: disability  Tobacco Use   Smoking status: Former    Current packs/day: 0.00    Average packs/day: 0.2 packs/day for 11.0 years (2.2 ttl pk-yrs)    Types: Cigarettes    Start date: 11/18/2002    Quit date: 11/17/2013    Years since quitting: 8.8   Smokeless tobacco: Never   Tobacco comments:    2 years quit  Vaping Use   Vaping status: Never Used  Substance and Sexual Activity   Alcohol use: No    Alcohol/week: 0.0 standard drinks of alcohol    Drug use: No   Sexual activity: Yes    Birth control/protection: Surgical  Other Topics Concern   Not on file  Social History Narrative   Lives with sister   Drinks no caffeine   Right Handed    Lives in a one story home    Social Determinants of Health   Financial Resource Strain: Low Risk  (07/01/2022)   Overall Financial Resource Strain (CARDIA)    Difficulty of Paying Living Expenses: Not hard at all  Food Insecurity: No Food Insecurity (09/19/2022)   Hunger Vital Sign    Worried About Running Out of Food in the Last Year: Never true    Ran Out of Food in the Last Year: Never true  Transportation Needs: No Transportation Needs (09/19/2022)   PRAPARE - Administrator, Civil Service (Medical): No    Lack of Transportation (Non-Medical): No  Physical Activity: Inactive (07/01/2022)   Exercise Vital Sign    Days of Exercise per Week: 0 days    Minutes of Exercise per Session: 0 min  Stress: No Stress Concern Present (07/01/2022)   [employer] of Occupational Health - Occupational Stress Questionnaire    Feeling of Stress : Not at all  Social Connections: Moderately Isolated (07/01/2022)   Social Connection and Isolation Panel [NHANES]    Frequency of Communication with Friends and Family: More than three times a week    Frequency of Social Gatherings with Friends and Family: Three times a week    Attends Religious Services: Never    Active Member of Clubs or Organizations: No    Attends Club  or Organization Meetings: Never    Marital Status: Married  Catering manager Violence: Not At Risk (09/14/2022)   Humiliation, Afraid, Rape, and Kick questionnaire    Fear of Current or Ex-Partner: No    Emotionally Abused: No    Physically Abused: No    Sexually Abused: No    Outpatient Medications Prior to Visit  Medication Sig Dispense Refill   Accu-Chek Softclix Lancets lancets Use as instructed 100 each 12   acetaminophen (TYLENOL) 325 MG tablet Take 2 tablets  (650 mg total) by mouth every 6 (six) hours as needed for mild pain, moderate pain, headache or fever.     albuterol (PROVENTIL HFA) 108 (90 Base) MCG/ACT inhaler Inhale 2 puffs into the lungs every 6 (six) hours as needed for wheezing or shortness of breath. 6.7 g 2   albuterol (PROVENTIL) (2.5 MG/3ML) 0.083% nebulizer solution Take 3 mLs (2.5 mg total) by nebulization every 6 (six) hours as needed for wheezing or shortness of breath. 150 mL 1   Blood Glucose Monitoring Suppl (ACCU-CHEK GUIDE) w/Device KIT Check blood sugars daily 1 kit 0   EPINEPHrine 0.3 mg/0.3 mL IJ SOAJ injection Inject 0.3 mg into the muscle as needed for anaphylaxis. 1 each 1   gabapentin (NEURONTIN) 300 MG capsule Take 1 capsule (300 mg total) by mouth 2 (two) times daily. 180 capsule 1   glucose blood (ACCU-CHEK GUIDE) test strip Use as instructed 100 each 12   Lancets Misc. (ACCU-CHEK SOFTCLIX LANCET DEV) KIT Use to check blood sugar 1 kit 0   Multiple Vitamins-Minerals (BARIATRIC MULTIVITAMINS/IRON PO) Take 1 tablet by mouth daily.     ondansetron (ZOFRAN) 4 MG tablet Take 1 tablet (4 mg total) by mouth every 6 (six) hours as needed for nausea. 20 tablet 0   potassium chloride SA (KLOR-CON M) 20 MEQ tablet Take 2 tablets (40 mEq total) by mouth daily. 30 tablet 0   sertraline (ZOLOFT) 50 MG tablet Take 1 tablet (50 mg total) by mouth daily. 30 tablet 3   SUMAtriptan (IMITREX) 50 MG tablet Take 1 tablet (50 mg total) by mouth every 2 (two) hours as needed for migraine. May repeat in 2 hours if headache persists or recurs. 20 tablet 0   No facility-administered medications prior to visit.    Allergies  Allergen Reactions   Asa [Aspirin] Anaphylaxis and Hives    Hives, chest tightness    Mushroom Extract Complex Anaphylaxis, Swelling and Other (See Comments)    Reaction:  Eye swelling   Penicillins Anaphylaxis    Anaphylaxis ~47 yo necessitating ED visit (took pink thick liquid) - cannot recall taking any  cephalosporins   Shellfish Allergy Anaphylaxis   Triamcinolone Other (See Comments)    Skin issues     ROS Review of Systems  Constitutional:  Negative for fatigue and fever.  HENT: Negative.    Eyes:  Negative for pain.  Respiratory:  Negative for chest tightness and shortness of breath (inhaler helps, steam helps).   Cardiovascular:  Negative for chest pain.  Gastrointestinal:  Negative for anal bleeding, constipation, nausea and rectal pain.  Endocrine: Negative for cold intolerance and polyuria.  Genitourinary: Negative.   Musculoskeletal:  Negative for myalgias.  Skin:  Positive for rash.  Neurological:  Positive for headaches (worsening migraines). Negative for weakness and numbness (right finger; as well as leg pain).  Psychiatric/Behavioral:  Negative for suicidal ideas. The patient is nervous/anxious.       Objective:    Physical Exam Vitals  reviewed.  Constitutional:      Appearance: Normal appearance. She is obese.  HENT:     Head: Normocephalic and atraumatic.  Cardiovascular:     Rate and Rhythm: Normal rate and regular rhythm.     Pulses:          Dorsalis pedis pulses are 2+ on the right side and 2+ on the left side.       Posterior tibial pulses are 2+ on the right side and 2+ on the left side.     Heart sounds: No murmur heard.    No friction rub. No gallop.  Pulmonary:     Effort: Pulmonary effort is normal.     Breath sounds: Normal breath sounds. No wheezing or rhonchi.  Musculoskeletal:     Right lower leg: No edema.     Left lower leg: No edema.  Feet:     Right foot:     Skin integrity: Skin integrity normal.     Left foot:     Skin integrity: Skin integrity normal.  Skin:    General: Skin is warm and dry.     Findings: Rash (mild follicular type rash on right lower leg) present.  Neurological:     General: No focal deficit present.     Mental Status: She is alert. Mental status is at baseline.  Psychiatric:        Attention and  Perception: Attention normal.        Mood and Affect: Mood normal.        Speech: Speech normal.        Behavior: Behavior normal.        Thought Content: Thought content normal.        Cognition and Memory: Cognition normal.     LMP 11/27/2012  Wt Readings from Last 3 Encounters:  09/14/22 209 lb 7 oz (95 kg)  09/04/22 218 lb (98.9 kg)  07/01/22 205 lb (93 kg)     Health Maintenance Due  Topic Date Due   OPHTHALMOLOGY EXAM  01/23/2021   Diabetic kidney evaluation - Urine ACR  02/21/2022   FOOT EXAM  02/21/2022   INFLUENZA VACCINE  09/18/2022    There are no preventive care reminders to display for this patient.  Lab Results  Component Value Date   TSH 0.821 04/09/2021   Lab Results  Component Value Date   WBC 7.5 09/16/2022   HGB 12.6 09/16/2022   HCT 37.4 09/16/2022   MCV 97.7 09/16/2022   PLT 414 (H) 09/16/2022   Lab Results  Component Value Date   NA 135 09/17/2022   K 3.3 (L) 09/17/2022   CO2 21 (L) 09/17/2022   GLUCOSE 121 (H) 09/17/2022   BUN <5 (L) 09/17/2022   CREATININE 0.64 09/17/2022   BILITOT 0.3 09/15/2022   ALKPHOS 66 09/15/2022   AST 87 (H) 09/15/2022   ALT 117 (H) 09/15/2022   PROT 6.4 (L) 09/15/2022   ALBUMIN 3.0 (L) 09/15/2022   CALCIUM 8.7 (L) 09/17/2022   ANIONGAP 12 09/17/2022   EGFR 103 09/04/2022   Lab Results  Component Value Date   CHOL 183 09/04/2022   Lab Results  Component Value Date   HDL 47 09/04/2022   Lab Results  Component Value Date   LDLCALC 91 09/04/2022   Lab Results  Component Value Date   TRIG 271 (H) 09/04/2022   Lab Results  Component Value Date   CHOLHDL 3.9 09/04/2022   Lab Results  Component Value  Date   HGBA1C 6.1 (H) 09/04/2022      Assessment & Plan:   Problem List Items Addressed This Visit   None     No orders of the defined types were placed in this encounter.   Follow-up:  Follow up with Dr. Delford Field in 4 months. I have seen and examined this patient with the mid-level  provider and agree with the above note .   Shan Levans, MD

## 2022-10-02 ENCOUNTER — Inpatient Hospital Stay: Payer: Medicare HMO | Admitting: Critical Care Medicine

## 2022-11-05 ENCOUNTER — Telehealth: Payer: Self-pay

## 2022-11-05 NOTE — Telephone Encounter (Signed)
Copied from CRM [id number]. Topic: Appointment Scheduling - Scheduling Inquiry for Clinic >> Nov 05, 2022 11:18 AM Dondra Prader E wrote: Reason for CRM: Pt needs to schedule an appt to have labs and resume A1C medication. Wants to speak to [facility]   [phone]

## 2022-11-07 NOTE — Telephone Encounter (Signed)
Called patient and left voicemail.

## 2022-11-21 NOTE — Telephone Encounter (Signed)
Pt is calling, requesting to speak with Carly. Requesting an appointment to be seen as soon as possible; first available is 10/30.  Pt requested information on GI; she was referred to as having an appointment that she missed. Do not see anything recent.  Please advise.

## 2022-11-24 NOTE — Telephone Encounter (Addendum)
Spoke with patient . Patient given GI appointment time, Location and phone # [address] [place], [place] [address] PH# [phone].    Patient PCP is no longer in the practice. OV visit schedule with Dr. Timoteo Expose for 12/18/2022

## 2022-12-10 ENCOUNTER — Ambulatory Visit (INDEPENDENT_AMBULATORY_CARE_PROVIDER_SITE_OTHER): Payer: Medicare HMO | Admitting: Infectious Diseases

## 2022-12-10 ENCOUNTER — Encounter: Payer: Self-pay | Admitting: Infectious Diseases

## 2022-12-10 ENCOUNTER — Other Ambulatory Visit: Payer: Self-pay

## 2022-12-10 VITALS — BP 113/78 | HR 98 | Temp 98.2°F | Ht 65.0 in | Wt 210.0 lb

## 2022-12-10 DIAGNOSIS — R7881 Bacteremia: Secondary | ICD-10-CM

## 2022-12-10 DIAGNOSIS — K529 Noninfective gastroenteritis and colitis, unspecified: Secondary | ICD-10-CM | POA: Diagnosis not present

## 2022-12-10 DIAGNOSIS — R112 Nausea with vomiting, unspecified: Secondary | ICD-10-CM | POA: Diagnosis not present

## 2022-12-10 NOTE — Progress Notes (Addendum)
Patient Active Problem List   Diagnosis Date Noted   Bacteremia 09/16/2022   Nausea and vomiting 09/16/2022   Vaginal itching 09/15/2022   Lactic acidosis 09/15/2022   GI bleed 09/14/2022   Colitis 09/14/2022   Elevated liver enzymes 09/14/2022   Elevated MCV 09/14/2022   Hypokalemia 09/14/2022   Multinodular thyroid 03/01/2021   Solitary pulmonary nodule 02/21/2021   Frequent falls 02/21/2021   Weakness of both lower extremities 02/21/2021   Carpal tunnel syndrome of right wrist 02/21/2021   Acute constipation 02/21/2021   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   Morbid obesity (HCC) 12/24/2020   Allergic rhinitis 06/26/2020   Hyperlipidemia associated with type 2 diabetes mellitus (HCC) 04/10/2020   Chronic pain syndrome 04/09/2020   Type 2 diabetes mellitus with diabetic neuropathy, without long-term current use of insulin (HCC) 04/09/2020   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 12/13/2019   Vitamin D deficiency 04/05/2019   Class 2 severe obesity with serious comorbidity and body mass index (BMI) of 36.0 to 36.9 in adult (HCC) 04/05/2019   Obesity (BMI 30-39.9) 03/05/2018   Chronic diarrhea    Benign neoplasm of transverse colon    Benign neoplasm of sigmoid colon    Neuropathic pain of both legs 06/04/2016   Rash and nonspecific skin eruption 07/12/2015   Dandruff 03/26/2015   Migraine variant with headache 05/09/2014   GAD (generalized anxiety disorder) 05/04/2014   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   Social anxiety disorder 05/04/2014   PTSD (post-traumatic stress disorder) 05/04/2014   Depression 03/27/2014   OSA (obstructive sleep apnea) 03/06/2014   Insomnia 03/06/2014   History of cardiac arrest    Chest pain 02/20/2014   Rectal bleeding 07/25/2013   S/P Total vaginal hysterectomy on 01/06/13 01/06/2013   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   ALBUTEROL (PROVENTIL HFA) 108 (90 BASE) MCG/ACT INHALER    Inhale 2 puffs into the lungs every 6 (six) hours as needed for wheezing or shortness of breath.   ALBUTEROL (PROVENTIL) (2.5 MG/3ML) 0.083% NEBULIZER SOLUTION    Take 3 mLs (2.5 mg total) by nebulization every 6 (six) hours as needed for wheezing or shortness of breath.   BLOOD GLUCOSE MONITORING SUPPL (ACCU-CHEK GUIDE) W/DEVICE KIT    Check blood sugars daily   EPINEPHRINE 0.3 MG/0.3 ML IJ SOAJ INJECTION    Inject 0.3 mg into the muscle as needed for anaphylaxis.   GABAPENTIN (NEURONTIN) 300 MG CAPSULE    Take 1 capsule (300 mg total) by mouth 2 (two) times daily.   GLUCOSE BLOOD (ACCU-CHEK GUIDE) TEST STRIP    Use as instructed   LANCETS MISC. (ACCU-CHEK SOFTCLIX LANCET DEV) KIT    Use to check blood sugar   MULTIPLE VITAMINS-MINERALS (BARIATRIC MULTIVITAMINS/IRON PO)    Take 1 tablet by mouth daily.   ONDANSETRON (ZOFRAN) 4 MG TABLET    Take 1 tablet (4 mg total) by mouth every 6 (six) hours as needed for nausea.   POTASSIUM CHLORIDE SA (KLOR-CON M) 20 MEQ TABLET    Take 2 tablets (40 mEq total) by mouth daily.   SERTRALINE (ZOLOFT) 50 MG  TABLET    Take 1 tablet (50 mg total) by mouth daily.   SUMATRIPTAN (IMITREX) 50 MG TABLET    Take 1 tablet (50 mg total) by mouth every 2 (two) hours as needed for migraine. May repeat in 2 hours if headache persists or recurs.  Modified Medications   No medications on file  Discontinued Medications   No medications on file    Subjective: 47 year old female with DM with neuropathy, h/o cardiac arrest, obesity/OSA s/p gastric sleeve resection GERD, HLD, IBS, GAD/PTSD who is here for HFU after hospitalized 7/28-8/1  for colitis and polymicrobial bacteremia.  Patient missed last appt and reports she missed appt as his brother died. Continues to have diarrhea 2-3 times watery stool a/w intermittent abdominal pain, denies nausea and vomiting. Diarrhea sometimes includes blood. Appetite is good. Denies changes in weight. Denies fevers and chills. He has a fu with GI tomorrow and with PCP later this month. Reports headache which is chronic a/w photophobia, can go up to 10 but better today. Thinks its related to her migrane and no new changes. Denies any blurry vision or neck pain. Denies GU symptoms, rashes. No complaints otherwise.   Review of Systems: all systems reviewed with pertinent positives and negatives as listed below  Past Medical History:  Diagnosis Date   Abdominal pain 04/26/2013   Abnormal uterine bleeding (AUB) 10/11/2012   Acute bronchitis    Allergy    Anal pain    chronic   Anemia    Anxiety    Asthma    exacerbation 02-28-2014 and 02-23-2014 secondary to Rhinovirus   Atypical chest pain 04/26/2013   Benign neoplasm of sigmoid colon    Benign neoplasm of transverse colon    Carbuncle of labium 07/12/2015   Chest pain 02/20/2014   Chronic diarrhea    Chronic headaches    Chronic low back pain    Cigarette nicotine dependence without complication 05/04/2014   Cyst of right ovary    Dandruff 03/26/2015   Diabetes mellitus without complication (HCC) 10/13/2013   Diabetic neuropathy, painful (HCC) 12/13/2019   Difficult intravenous access    PER PT NEEDS PICC LINE   Dyspnea 09/07/2012   Cleda Daub 08/2012:  No obstruction by FEV1%, but probable restriction.     Falls 03/27/2014   Food allergy    Mushrooms, shellfish   GAD (generalized anxiety disorder) 05/04/2014   Gait disturbance 04/11/2014   Gait instability    GERD (gastroesophageal reflux disease)    History of adenomatous polyp of colon    History of cardiac arrest    during SVD 1992   History of ectopic pregnancy    2009-  S/P LEFT SALPINGECTOMY   History of panic  attacks    Hyperlipidemia    IBS (irritable bowel syndrome)    Insomnia 03/06/2014   Joint pain    Lower extremity edema    Lumbar stenosis L4 -- L5 with bulging disk   w/ right leg weakness/ decreased mobility   Migraine variant with headache 05/09/2014   Mild obstructive sleep apnea    study 03-20-2014  no cpap recommended   Nausea and vomiting 09/16/2022   Neuromuscular disorder (HCC)    neuropathy in feet    Neuropathic pain of both legs 06/04/2016   Obesity (BMI 30-39.9) 03/05/2018   OSA (obstructive sleep apnea) 03/06/2014   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   Panniculitis 03/05/2018   Pelvic pain 07/25/2013   Persistent vomiting 04/27/2013   Pneumonia  PTSD (post-traumatic stress disorder) 05/04/2014   Rash and nonspecific skin eruption 07/12/2015   Rectal bleeding 07/25/2013   RLQ abdominal pain    S/P Total vaginal hysterectomy on 01/06/13 01/06/2013   Sleep apnea    mild no cpap   Social anxiety disorder 05/04/2014   Sore throat 02/20/2014   Stomach ulcer    Tachycardia 02/20/2014   Type 2 diabetes mellitus (HCC)    Weakness of right leg    FROM BACK PROBLEM PER PT   Past Surgical History:  Procedure Laterality Date   ABDOMINAL HYSTERECTOMY     partial   COLONOSCOPY Left 04/29/2013   Procedure: COLONOSCOPY;  Surgeon: Willis Modena, MD;  Location: [facility];  Service: Endoscopy;  Laterality: Left;   COLONOSCOPY     COLONOSCOPY WITH PROPOFOL N/A 08/01/2016   Procedure: COLONOSCOPY WITH PROPOFOL;  Surgeon: Sherrilyn Rist, MD;  Location: [facility];  Service: Gastroenterology;  Laterality: N/A;   ECTOPIC PREGNANCY SURGERY     ESOPHAGOGASTRODUODENOSCOPY (EGD) WITH PROPOFOL N/A 11/10/2018   Procedure: ESOPHAGOGASTRODUODENOSCOPY (EGD) WITH PROPOFOL;  Surgeon: Sherrilyn Rist, MD;  Location: [facility];  Service: Gastroenterology;  Laterality: N/A;   EVALUATION UNDER ANESTHESIA WITH FISTULECTOMY N/A 04/20/2014   Procedure: EXAM UNDER  ANESTHESIA ;  Surgeon: Romie Levee, MD;  Location: [facility];  Service: General;  Laterality: N/A;   FLEXIBLE SIGMOIDOSCOPY N/A 11/09/2013   Procedure: FLEXIBLE SIGMOIDOSCOPY;  Surgeon: Willis Modena, MD;  Location: [facility];  Service: Endoscopy;  Laterality: N/A;   fupa removal      LAPAROSCOPIC CHOLECYSTECTOMY  2005   LAPAROSCOPIC GASTRIC SLEEVE RESECTION N/A 12/24/2020   Procedure: LAPAROSCOPIC GASTRIC SLEEVE RESECTION;  Surgeon: Berna Bue, MD;  Location: [facility];  Service: General;  Laterality: N/A;   REFRACTIVE SURGERY     SPHINCTEROTOMY N/A 04/20/2014   Procedure:  LATERAL INTERNAL SPHINCTEROTOMY;  Surgeon: Romie Levee, MD;  Location: [facility];  Service: General;  Laterality: N/A;   TRANSTHORACIC ECHOCARDIOGRAM  12/30/2012   mild LVH/  ef 55-60%   UNILATERAL SALPINGECTOMY  2009   laparotomy left salpingectomy-- ectopic preg.   UPPER GASTROINTESTINAL ENDOSCOPY     UPPER GI ENDOSCOPY N/A 12/24/2020   Procedure: UPPER GI ENDOSCOPY;  Surgeon: Berna Bue, MD;  Location: [facility];  Service: General;  Laterality: N/A;   VAGINAL HYSTERECTOMY N/A 01/06/2013   Procedure: HYSTERECTOMY VAGINAL;  Surgeon: Tereso Newcomer, MD;  Location: [facility];  Service: Gynecology;  Laterality: N/A;    Social History   Tobacco Use   Smoking status: Former    Current packs/day: 0.00    Average packs/day: 0.2 packs/day for 11.0 years (2.2 ttl pk-yrs)    Types: Cigarettes    Start date: 11/18/2002    Quit date: 11/17/2013    Years since quitting: 9.0   Smokeless tobacco: Never   Tobacco comments:    2 years quit  Vaping Use   Vaping status: Never Used  Substance Use Topics   Alcohol use: No    Alcohol/week: 0.0 standard drinks of alcohol   Drug use: No    Family History  Problem Relation Age of Onset   Hypertension Mother    Diabetes Mother    Allergies Mother    Heart disease Mother    Clotting disorder Mother    Stroke Mother    Kidney  disease Mother    Thyroid disease Mother    Cancer Father    Hyperlipidemia Father  Hypertension Father    Colon cancer Father    Liver disease Father    Heart disease Maternal Grandmother    Breast cancer Maternal Grandmother 28   Schizophrenia Sister    Bipolar disorder Sister    Clotting disorder Sister    Bipolar disorder Brother    Kidney disease Brother    Bipolar disorder Sister    Pancreatic cancer Maternal Aunt    Prostate cancer Maternal Uncle    Liver cancer Maternal Grandfather    Rectal cancer Maternal Grandfather    Liver cancer Paternal Grandfather    Colon polyps Neg Hx    Esophageal cancer Neg Hx    Stomach cancer Neg Hx     Allergies  Allergen Reactions   Asa [Aspirin] Anaphylaxis and Hives    Hives, chest tightness    Mushroom Extract Complex Anaphylaxis, Swelling and Other (See Comments)    Reaction:  Eye swelling   Penicillins Anaphylaxis    Anaphylaxis ~47 yo necessitating ED visit (took pink thick liquid) - cannot recall taking any cephalosporins   Shellfish Allergy Anaphylaxis   Triamcinolone Other (See Comments)    Skin issues     Health Maintenance  Topic Date Due   OPHTHALMOLOGY EXAM  01/23/2021   Diabetic kidney evaluation - Urine ACR  02/21/2022   FOOT EXAM  02/21/2022   INFLUENZA VACCINE  09/18/2022   HEMOGLOBIN A1C  03/07/2023   Medicare Annual Wellness (AWV)  07/01/2023   Diabetic kidney evaluation - eGFR measurement  09/17/2023   Colonoscopy  08/02/2026   DTaP/Tdap/Td (2 - Td or Tdap) 04/09/2030   Hepatitis C Screening  Completed   HIV Screening  Completed   HPV VACCINES  Aged Out   COVID-19 Vaccine  Discontinued    Objective:  Vitals:   12/10/22 1552  BP: 113/78  Pulse: 98  Temp: 98.2 F (36.8 C)  TempSrc: Temporal  SpO2: 96%  Weight: 210 lb (95.3 kg)  Height: 5\' 5"  (1.651 m)   Body mass index is 34.95 kg/m.  Physical Exam Constitutional:      Appearance: Normal appearance.  HENT:     Head: Normocephalic  and atraumatic.      Mouth: Mucous membranes are moist.  Eyes:    Conjunctiva/sclera: Conjunctivae normal.     Pupils: Pupils are equal, round, and bilaterally symmetrical   Cardiovascular:     Rate and Rhythm: Normal rate and regular rhythm.     Heart sounds:   Pulmonary:     Effort: Pulmonary effort is normal on room air    Breath sounds:   Abdominal:     General: Non distended     Palpations:  Musculoskeletal:        General: Normal range of motion.   Skin:    General: Skin is warm and dry.     Comments:  Neurological:     General: grossly non focal     Mental Status: awake, alert and oriented to person, place, and time.   Psychiatric:        Mood and Affect: Mood normal.   Lab Results Lab Results  Component Value Date   WBC 7.5 09/16/2022   HGB 12.6 09/16/2022   HCT 37.4 09/16/2022   MCV 97.7 09/16/2022   PLT 414 (H) 09/16/2022    Lab Results  Component Value Date   CREATININE 0.64 09/17/2022   BUN <5 (L) 09/17/2022   NA 135 09/17/2022   K 3.3 (L) 09/17/2022   CL 102 09/17/2022  CO2 21 (L) 09/17/2022    Lab Results  Component Value Date   ALT 117 (H) 09/15/2022   AST 87 (H) 09/15/2022   ALKPHOS 66 09/15/2022   BILITOT 0.3 09/15/2022    Lab Results  Component Value Date   CHOL 183 09/04/2022   HDL 47 09/04/2022   LDLCALC 91 09/04/2022   TRIG 271 (H) 09/04/2022   CHOLHDL 3.9 09/04/2022   Lab Results  Component Value Date   LABRPR Non Reactive 12/29/2017   No results found for: "HIV1RNAQUANT", "HIV1RNAVL", "CD4TABS"   Assessment/Plan 47 year old female with DM with neuropathy, h/o cardiac arrest, obesity/OSA s/p gastric sleeve resection GERD, HLD, IBS, GAD/PTSD who is here for HFU after hospitalized 7/28-8/1  for colitis and polymicrobial bacteremia.  - 2 sets of blood cultures for surveillance today  - Fu pending above  # Chronic GI symptoms - Fu with GI as planned   I have personally spent 30 minutes involved in face-to-face and  non-face-to-face activities for this patient on the day of the visit. Professional time spent includes the following activities: Preparing to see the patient (review of tests), Obtaining and/or reviewing separately obtained history (admission/discharge record), Performing a medically appropriate examination and/or evaluation , Ordering medications/tests/procedures, referring and communicating with other health care professionals, Documenting clinical information in the EMR, Independently interpreting results (not separately reported), Communicating results to the patient/family/caregiver, Counseling and educating the patient/family/caregiver and Care coordination (not separately reported).   Victoriano Lain, MD [facility] 12/10/2022, 4:15 PM

## 2022-12-11 ENCOUNTER — Ambulatory Visit: Payer: Medicare HMO | Admitting: Gastroenterology

## 2022-12-11 ENCOUNTER — Encounter: Payer: Self-pay | Admitting: Gastroenterology

## 2022-12-11 VITALS — BP 122/80 | HR 79 | Ht 65.0 in | Wt 215.0 lb

## 2022-12-11 DIAGNOSIS — R933 Abnormal findings on diagnostic imaging of other parts of digestive tract: Secondary | ICD-10-CM

## 2022-12-11 DIAGNOSIS — K625 Hemorrhage of anus and rectum: Secondary | ICD-10-CM | POA: Diagnosis not present

## 2022-12-11 DIAGNOSIS — R1030 Lower abdominal pain, unspecified: Secondary | ICD-10-CM

## 2022-12-11 MED ORDER — NA SULFATE-K SULFATE-MG SULF 17.5-3.13-1.6 GM/177ML PO SOLN: 1.0000 | Freq: Once | ORAL | 0 refills | Status: AC

## 2022-12-11 NOTE — Progress Notes (Signed)
[facility] Consult Note:  History: Marisa [name] 12/11/2022  Referring provider: Storm Frisk, MD  Reason for consult/chief complaint: Colonoscopy (Pt states she would like to discuss a colon.) and Rectal Bleeding (Pt states she has rectal bleeding.)   Subjective  HPI: From my May 2021 office note: "Seen in clinic March 2018 for rectal bleeding, see note for details.  Internal hemorrhoids noted on prior colonoscopy at [facility], previous sphincterotomy by colorectal surgery for anal fissure.  My exam revealed a healing posterior anal fissure. Colonoscopy done at [facility] (history of difficult IV access) June 2018 revealed a diminutive transverse colon adenoma, diverticulosis and small internal hemorrhoids.   Seen August 2020 after some ED visits with chest and epigastric pain recurring with shortness of breath and feelings of anxiety.  She was having some a.m. reflux symptoms as well.  She continued to have generalized abdominal pain and postprandial bloating, postprandial urgency for loose nonbloody BMs.  There were suspected to be a functional component to the abdominal pain, exam revealed generalized tenderness to light palpation of abdominal wall.  EGD September 2020 normal.   Cai is bothered by diarrhea and rectal bleeding.  Her bowel habits do tend to alternate, but most days now she has 2 or 3 semiformed to loose BMs and she feels this is exacerbating hemorrhoidal bleeding.  She has some pain in the lower back that is like a pressure with bowel movements.  She does not have any anal pain with bowel movements, and is having bleeding 2 or 3 times a week. She really does not know whether or not the bowel habits changed with Glucophage or semaglutide According to the most recent primary care note, these medicines have worked well to control her diabetes."  Trial of low-dose dicyclomine given with caution due to patient's tendencies to  have alternating constipation and diarrhea.  Possible side effects of Glucophage and semaglutide contributing to altered bowel habits. ______________________________   [name] continues to have alternating bowel habits, though more often diarrhea.  Rectal bleeding has become more frequent than when I last saw her, now occurring more days than not.  She continues to have intermittent lower abdominal pain, often more toward the left side.  She was concerned because she recalls being overdue for the colonoscopy. June 2018 colonoscopy for altered bowels and rectal bleeding.  Internal HR and 2 diminutive TA polyps  (See July 0224 hospital records below) ROS:  Review of Systems Anxiety Neuropathic pain Headaches Remainder non-GI systems negative except as above  Past Medical History: Past Medical History:  Diagnosis Date   Abdominal pain 04/26/2013   Abnormal uterine bleeding (AUB) 10/11/2012   Acute bronchitis    Allergy    Anal pain    chronic   Anemia    Anxiety    Asthma    exacerbation 02-28-2014 and 02-23-2014 secondary to Rhinovirus   Atypical chest pain 04/26/2013   Benign neoplasm of sigmoid colon    Benign neoplasm of transverse colon    Carbuncle of labium 07/12/2015   Chest pain 02/20/2014   Chronic diarrhea    Chronic headaches    Chronic low back pain    Cigarette nicotine dependence without complication 05/04/2014   Cyst of right ovary    Dandruff 03/26/2015   Diabetes mellitus without complication (HCC) 10/13/2013   Diabetic neuropathy, painful (HCC) 12/13/2019   Difficult intravenous access    PER PT NEEDS PICC LINE   Dyspnea 09/07/2012  Cleda Daub 08/2012:  No obstruction by FEV1%, but probable restriction.     Falls 03/27/2014   Food allergy    Mushrooms, shellfish   GAD (generalized anxiety disorder) 05/04/2014   Gait disturbance 04/11/2014   Gait instability    GERD (gastroesophageal reflux disease)    History of adenomatous polyp of colon    History  of cardiac arrest    during SVD 1992   History of ectopic pregnancy    2009-  S/P LEFT SALPINGECTOMY   History of panic attacks    Hyperlipidemia    IBS (irritable bowel syndrome)    Insomnia 03/06/2014   Joint pain    Lower extremity edema    Lumbar stenosis L4 -- L5 with bulging disk   w/ right leg weakness/ decreased mobility   Migraine variant with headache 05/09/2014   Mild obstructive sleep apnea    study 03-20-2014  no cpap recommended   Nausea and vomiting 09/16/2022   Neuromuscular disorder (HCC)    neuropathy in feet    Neuropathic pain of both legs 06/04/2016   Obesity (BMI 30-39.9) 03/05/2018   OSA (obstructive sleep apnea) 03/06/2014   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   Panniculitis 03/05/2018   Pelvic pain 07/25/2013   Persistent vomiting 04/27/2013   Pneumonia    PTSD (post-traumatic stress disorder) 05/04/2014   Rash and nonspecific skin eruption 07/12/2015   Rectal bleeding 07/25/2013   RLQ abdominal pain    S/P Total vaginal hysterectomy on 01/06/13 01/06/2013   Sleep apnea    mild no cpap   Social anxiety disorder 05/04/2014   Sore throat 02/20/2014   Stomach ulcer    Tachycardia 02/20/2014   Type 2 diabetes mellitus (HCC)    Weakness of right leg    FROM BACK PROBLEM PER PT   From July 2024 hospitalization discharge summary: "Marisa [name] is a 47 y.o.female with a history of T2DM, GAD, PTSD, IBS, and migraine disorder who was admitted to the [facility] Medicine Teaching Service at [facility] for lactic acidosis. Her hospital course is detailed below:   Lactic Acidosis  Colitis  Lactic acid elevated to 3.3 and trended up to 3.9 likely secondary to nausea, vomiting, diarrhea prior to admission but concern for sepsis so was admitted after fluids and blood cultures obtained. Trended down to 2.5 after fluids. Chest x-ray showed no acute cardiopulmonary abnormality. CT abdomen pelvis with contrast shows nonspecific colitis, colonic diverticulosis  with no acute diverticulitis. Started on IV Ciprofloxacin (7/28) and Flagyl (7/28) and was transitioned to PO ciprofloxacin (7/29), azithromycin (7/29-7/31) and flagyl (7/29). Blood cultures showed 3/4 bottles with strep and staph capitis species, likely due to contamination. Vancomycin was added (7/29-8/1). Repeat cultures demonstrated Strep mitis/oralis and staph capitis. ID was consulted who recommended repeat cultures and continuing vancomycin until discharge. Ultimately we felt that these culture results most likely represented contaminant.  Prior to discharge, ID arranged follow-up appointment and planned to get repeat blood cultures outpatient.    Rectal Bleeding Patient reported abdominal pain, hematemesis, and hematochezia over the past several days. FOBT positive in the ED, but hemodynamically stable with Hgb 14.2. GI was consulted and due to patient's hemodynamic stability and no active brisk bleed, recommended no intervention at this time and sooner outpatient follow-up with [facility].   Elevated Liver Enzymes AST 163 and ALT 143.  Patient reports that she drinks 2 to 3 glasses of wine every other day.  No history of alcohol withdrawal with tremors or seizures. Liver enzymes  down trended to AST 87 and ALT 117. "   Past Surgical History: Past Surgical History:  Procedure Laterality Date   ABDOMINAL HYSTERECTOMY     partial   COLONOSCOPY Left 04/29/2013   Procedure: COLONOSCOPY;  Surgeon: Willis Modena, MD;  Location: [facility];  Service: Endoscopy;  Laterality: Left;   COLONOSCOPY     COLONOSCOPY WITH PROPOFOL N/A 08/01/2016   Procedure: COLONOSCOPY WITH PROPOFOL;  Surgeon: Sherrilyn Rist, MD;  Location: [facility];  Service: Gastroenterology;  Laterality: N/A;   ECTOPIC PREGNANCY SURGERY     ESOPHAGOGASTRODUODENOSCOPY (EGD) WITH PROPOFOL N/A 11/10/2018   Procedure: ESOPHAGOGASTRODUODENOSCOPY (EGD) WITH PROPOFOL;  Surgeon: Sherrilyn Rist, MD;  Location: [facility];   Service: Gastroenterology;  Laterality: N/A;   EVALUATION UNDER ANESTHESIA WITH FISTULECTOMY N/A 04/20/2014   Procedure: EXAM UNDER ANESTHESIA ;  Surgeon: Romie Levee, MD;  Location: [facility];  Service: General;  Laterality: N/A;   FLEXIBLE SIGMOIDOSCOPY N/A 11/09/2013   Procedure: FLEXIBLE SIGMOIDOSCOPY;  Surgeon: Willis Modena, MD;  Location: [facility];  Service: Endoscopy;  Laterality: N/A;   fupa removal      LAPAROSCOPIC CHOLECYSTECTOMY  2005   LAPAROSCOPIC GASTRIC SLEEVE RESECTION N/A 12/24/2020   Procedure: LAPAROSCOPIC GASTRIC SLEEVE RESECTION;  Surgeon: Berna Bue, MD;  Location: [facility];  Service: General;  Laterality: N/A;   REFRACTIVE SURGERY     SPHINCTEROTOMY N/A 04/20/2014   Procedure:  LATERAL INTERNAL SPHINCTEROTOMY;  Surgeon: Romie Levee, MD;  Location: [facility];  Service: General;  Laterality: N/A;   TRANSTHORACIC ECHOCARDIOGRAM  12/30/2012   mild LVH/  ef 55-60%   UNILATERAL SALPINGECTOMY  2009   laparotomy left salpingectomy-- ectopic preg.   UPPER GASTROINTESTINAL ENDOSCOPY     UPPER GI ENDOSCOPY N/A 12/24/2020   Procedure: UPPER GI ENDOSCOPY;  Surgeon: Berna Bue, MD;  Location: [facility];  Service: General;  Laterality: N/A;   VAGINAL HYSTERECTOMY N/A 01/06/2013   Procedure: HYSTERECTOMY VAGINAL;  Surgeon: Tereso Newcomer, MD;  Location: [facility];  Service: Gynecology;  Laterality: N/A;     Family History: Family History  Problem Relation Age of Onset   Hypertension Mother    Diabetes Mother    Allergies Mother    Heart disease Mother    Clotting disorder Mother    Stroke Mother    Kidney disease Mother    Thyroid disease Mother    Cancer Father    Hyperlipidemia Father    Hypertension Father    Colon cancer Father    Liver disease Father    Heart disease Maternal Grandmother    Breast cancer Maternal Grandmother 81   Schizophrenia Sister    Bipolar disorder Sister    Clotting disorder Sister     Bipolar disorder Brother    Kidney disease Brother    Bipolar disorder Sister    Pancreatic cancer Maternal Aunt    Prostate cancer Maternal Uncle    Liver cancer Maternal Grandfather    Rectal cancer Maternal Grandfather    Liver cancer Paternal Grandfather    Colon polyps Neg Hx    Esophageal cancer Neg Hx    Stomach cancer Neg Hx     Social History: Social History   Socioeconomic History   Marital status: Married    Spouse name: Not on file   Number of children: 1   Years of education: Not on file   Highest education level: Not on file  Occupational History   Occupation: disability  Tobacco Use   Smoking status: Former    Current packs/day: 0.00    Average packs/day: 0.2 packs/day for 11.0 years (2.2 ttl pk-yrs)    Types: Cigarettes    Start date: 11/18/2002    Quit date: 11/17/2013    Years since quitting: 9.0   Smokeless tobacco: Never   Tobacco comments:    2 years quit  Vaping Use   Vaping status: Never Used  Substance and Sexual Activity   Alcohol use: No    Alcohol/week: 0.0 standard drinks of alcohol   Drug use: No   Sexual activity: Yes    Birth control/protection: Surgical  Other Topics Concern   Not on file  Social History Narrative   Lives with sister   Drinks no caffeine   Right Handed    Lives in a one story home    Social Determinants of Health   Financial Resource Strain: Low Risk  (07/01/2022)   Overall Financial Resource Strain (CARDIA)    Difficulty of Paying Living Expenses: Not hard at all  Food Insecurity: No Food Insecurity (09/19/2022)   Hunger Vital Sign    Worried About Running Out of Food in the Last Year: Never true    Ran Out of Food in the Last Year: Never true  Transportation Needs: No Transportation Needs (09/19/2022)   PRAPARE - Administrator, Civil Service (Medical): No    Lack of Transportation (Non-Medical): No  Physical Activity: Inactive (07/01/2022)   Exercise Vital Sign    Days of Exercise per Week: 0 days     Minutes of Exercise per Session: 0 min  Stress: No Stress Concern Present (07/01/2022)   [employer] of Occupational Health - Occupational Stress Questionnaire    Feeling of Stress : Not at all  Social Connections: Moderately Isolated (07/01/2022)   Social Connection and Isolation Panel [NHANES]    Frequency of Communication with Friends and Family: More than three times a week    Frequency of Social Gatherings with Friends and Family: Three times a week    Attends Religious Services: Never    Active Member of Clubs or Organizations: No    Attends Banker Meetings: Never    Marital Status: Married    Allergies: Allergies  Allergen Reactions   Asa [Aspirin] Anaphylaxis and Hives    Hives, chest tightness    Mushroom Extract Complex Anaphylaxis, Swelling and Other (See Comments)    Reaction:  Eye swelling   Penicillins Anaphylaxis    Anaphylaxis ~47 yo necessitating ED visit (took pink thick liquid) - cannot recall taking any cephalosporins   Shellfish Allergy Anaphylaxis   Triamcinolone Other (See Comments)    Skin issues     Outpatient Meds: Current Outpatient Medications  Medication Sig Dispense Refill   albuterol (PROVENTIL HFA) 108 (90 Base) MCG/ACT inhaler Inhale 2 puffs into the lungs every 6 (six) hours as needed for wheezing or shortness of breath. 6.7 g 2   albuterol (PROVENTIL) (2.5 MG/3ML) 0.083% nebulizer solution Take 3 mLs (2.5 mg total) by nebulization every 6 (six) hours as needed for wheezing or shortness of breath. 150 mL 1   Blood Glucose Monitoring Suppl (ACCU-CHEK GUIDE) w/Device KIT Check blood sugars daily 1 kit 0   EPINEPHrine 0.3 mg/0.3 mL IJ SOAJ injection Inject 0.3 mg into the muscle as needed for anaphylaxis. 1 each 1   gabapentin (NEURONTIN) 300 MG capsule Take 1 capsule (300 mg total) by mouth 2 (two) times daily. 180 capsule  1   glucose blood (ACCU-CHEK GUIDE) test strip Use as instructed 100 each 12   Lancets Misc. (ACCU-CHEK  SOFTCLIX LANCET DEV) KIT Use to check blood sugar 1 kit 0   Multiple Vitamins-Minerals (BARIATRIC MULTIVITAMINS/IRON PO) Take 1 tablet by mouth daily.     ondansetron (ZOFRAN) 4 MG tablet Take 1 tablet (4 mg total) by mouth every 6 (six) hours as needed for nausea. 20 tablet 0   potassium chloride SA (KLOR-CON M) 20 MEQ tablet Take 2 tablets (40 mEq total) by mouth daily. 30 tablet 0   sertraline (ZOLOFT) 50 MG tablet Take 1 tablet (50 mg total) by mouth daily. 30 tablet 3   SUMAtriptan (IMITREX) 50 MG tablet Take 1 tablet (50 mg total) by mouth every 2 (two) hours as needed for migraine. May repeat in 2 hours if headache persists or recurs. 20 tablet 0   Accu-Chek Softclix Lancets lancets Use as instructed 100 each 12   No current facility-administered medications for this visit.      ___________________________________________________________________ Objective   Exam:  BP 122/80   Pulse 79   Ht 5\' 5"  (1.651 m)   Wt 215 lb (97.5 kg)   LMP 11/27/2012   BMI 35.78 kg/m  Wt Readings from Last 3 Encounters:  12/11/22 215 lb (97.5 kg)  12/10/22 210 lb (95.3 kg)  09/14/22 209 lb 7 oz (95 kg)   Female adult and child family members in the room for the entire visit. General: Well-appearing Eyes: sclera anicteric, no redness ENT: oral mucosa moist without lesions, no cervical or supraclavicular lymphadenopathy CV: Regular without appreciable murmur, no JVD, no peripheral edema Resp: clear to auscultation bilaterally, normal RR and effort noted GI: soft, mild scattered tenderness to light palpation, mostly left lower quadrant tenderness, with active bowel sounds. No guarding or palpable organomegaly noted. Skin; warm and dry, no rash or jaundice noted Neuro: awake, alert and oriented x 3. Normal gross motor function and fluent speech  Labs: None recent  Radiologic Studies:  CLINICAL DATA:  Left lower quadrant abdominal pain   EXAM: CT ABDOMEN AND PELVIS WITH CONTRAST    TECHNIQUE: Multidetector CT imaging of the abdomen and pelvis was performed using the standard protocol following bolus administration of intravenous contrast.   RADIATION DOSE REDUCTION: This exam was performed according to the departmental dose-optimization program which includes automated exposure control, adjustment of the mA and/or kV according to patient size and/or use of iterative reconstruction technique.   CONTRAST:  75mL OMNIPAQUE IOHEXOL 350 MG/ML SOLN   COMPARISON:  01/04/2021   FINDINGS: Lower chest: No acute abnormality.   Hepatobiliary: Diffusely decreased attenuation of the hepatic parenchyma. No focal liver lesion is identified. Status post cholecystectomy. No biliary dilatation.   Pancreas: Unremarkable. No pancreatic ductal dilatation or surrounding inflammatory changes.   Spleen: Normal in size without focal abnormality.   Adrenals/Urinary Tract: Unremarkable adrenal glands. Kidneys enhance symmetrically without focal lesion, stone, or hydronephrosis. Ureters are nondilated. Urinary bladder appears unremarkable for the degree of distention.   Stomach/Bowel: Postsurgical changes of sleeve gastrectomy. The previously seen fluid collection adjacent to the suture line of the proximal stomach has resolved. No dilated loops of bowel. Normal appendix in the right lower quadrant. Long segment mild colonic wall thickening of the descending and sigmoid colon. Scattered colonic diverticula. No focally inflamed diverticulum is identified.   Vascular/Lymphatic: Scattered aortoiliac atherosclerotic calcifications without aneurysm. No abdominopelvic lymphadenopathy.   Reproductive: Status post hysterectomy. No adnexal masses.   Other: No free  fluid. No abdominopelvic fluid collection. No pneumoperitoneum. No abdominal wall hernia.   Musculoskeletal: No acute or significant osseous findings.   IMPRESSION: 1. Long segment mild colonic wall thickening of the  descending and sigmoid colon, compatible with a nonspecific colitis. 2. Colonic diverticulosis without evidence of acute diverticulitis. 3. Hepatic steatosis. 4. Aortic atherosclerosis (ICD10-I70.0).     Electronically Signed   By: Duanne Guess D.O.   On: 09/14/2022 14:34    Assessment: Encounter Diagnoses  Name Primary?   Rectal bleeding Yes   Lower abdominal pain    Abnormal finding on GI tract imaging     Chronic IBS-like abdominal pain and altered bowel habits.  Ongoing rectal bleeding that is most likely hemorrhoidal based on previous colonoscopy findings.  Significance of CT scan findings from July hospitalization uncertain.  Signs that she may have had some acute infectious colitis if those CT scan findings are anything more than just artifact.  Less likely she has developed IBD but still a possibility.  Plan:  Colonoscopy scheduled.  She was agreeable after discussion of procedure and risks.  The benefits and risks of the planned procedure were described in detail with the patient or (when appropriate) their health care proxy.  Risks were outlined as including, but not limited to, bleeding, infection, perforation, adverse medication reaction leading to cardiac or pulmonary decompensation, pancreatitis (if ERCP).  The limitation of incomplete mucosal visualization was also discussed.  No guarantees or warranties were given.  He is to be done in the hospital outpatient endoscopy lab due to history of difficult IV access  Hemorrhoid suppository use in the interim  Thank you for the courtesy of this consult.  Please call me with any questions or concerns.  Charlie Pitter III  CC: Referring provider noted above

## 2022-12-11 NOTE — Patient Instructions (Addendum)
You have been scheduled for a colonoscopy. Please follow written instructions given to you at your visit today.   Please pick up your prep supplies at the pharmacy within the next 1-3 days.  If you use inhalers (even only as needed), please bring them with you on the day of your procedure.  DO NOT TAKE 7 DAYS PRIOR TO TEST- Trulicity (dulaglutide) Ozempic, Wegovy (semaglutide) Mounjaro (tirzepatide) Bydureon Bcise (exanatide extended release)  DO NOT TAKE 1 DAY PRIOR TO YOUR TEST Rybelsus (semaglutide) Adlyxin (lixisenatide) Victoza (liraglutide) Byetta (exanatide) ___________________________________________________________________________  Please purchase the following medications over the counter and take as directed: PREP H SUPPOSITORY- twice daily when bleeding  _______________________________________________________  If your blood pressure at your visit was 140/90 or greater, please contact your primary care physician to follow up on this.  _______________________________________________________  If you are age 48 or older, your body mass index should be between 23-30. Your Body mass index is 35.78 kg/m. If this is out of the aforementioned range listed, please consider follow up with your Primary Care Provider.  If you are age 41 or younger, your body mass index should be between 19-25. Your Body mass index is 35.78 kg/m. If this is out of the aformentioned range listed, please consider follow up with your Primary Care Provider.   ________________________________________________________  The [facility] providers would like to encourage you to use [facility] to communicate with providers for non-urgent requests or questions.  Due to long hold times on the telephone, sending your provider a message by [facility] may be a faster and more efficient way to get a response.  Please allow 48 business hours for a response.  Please remember that this is for non-urgent requests.   _______________________________________________________ It was a pleasure to see you today!  Thank you for trusting me with your gastrointestinal care!

## 2022-12-13 DIAGNOSIS — R7881 Bacteremia: Secondary | ICD-10-CM | POA: Insufficient documentation

## 2022-12-16 LAB — CULTURE, BLOOD (SINGLE)
MICRO NUMBER:: 15634275
MICRO NUMBER:: 15634276
Result:: NO GROWTH
SPECIMEN QUALITY:: ADEQUATE

## 2022-12-18 ENCOUNTER — Telehealth: Payer: Self-pay | Admitting: Critical Care Medicine

## 2022-12-18 ENCOUNTER — Ambulatory Visit: Payer: Medicare HMO | Attending: Internal Medicine | Admitting: Internal Medicine

## 2022-12-18 ENCOUNTER — Encounter: Payer: Self-pay | Admitting: Internal Medicine

## 2022-12-18 VITALS — BP 108/75 | HR 87 | Temp 97.9°F | Ht 65.0 in | Wt 215.0 lb

## 2022-12-18 DIAGNOSIS — F411 Generalized anxiety disorder: Secondary | ICD-10-CM | POA: Diagnosis not present

## 2022-12-18 DIAGNOSIS — E559 Vitamin D deficiency, unspecified: Secondary | ICD-10-CM | POA: Diagnosis not present

## 2022-12-18 DIAGNOSIS — Z6835 Body mass index (BMI) 35.0-35.9, adult: Secondary | ICD-10-CM

## 2022-12-18 DIAGNOSIS — E66812 Obesity, class 2: Secondary | ICD-10-CM

## 2022-12-18 DIAGNOSIS — E119 Type 2 diabetes mellitus without complications: Secondary | ICD-10-CM | POA: Diagnosis not present

## 2022-12-18 DIAGNOSIS — F320 Major depressive disorder, single episode, mild: Secondary | ICD-10-CM | POA: Diagnosis not present

## 2022-12-18 DIAGNOSIS — G43709 Chronic migraine without aura, not intractable, without status migrainosus: Secondary | ICD-10-CM | POA: Diagnosis not present

## 2022-12-18 LAB — GLUCOSE, POCT (MANUAL RESULT ENTRY): POC Glucose: 93 mg/dL (ref 70–99)

## 2022-12-18 LAB — POCT GLYCOSYLATED HEMOGLOBIN (HGB A1C): HbA1c, POC (controlled diabetic range): 5.5 % (ref 0.0–7.0)

## 2022-12-18 MED ORDER — DULOXETINE HCL 20 MG PO CPEP
20.0000 mg | ORAL_CAPSULE | Freq: Every day | ORAL | 3 refills | Status: DC
Start: 2022-12-18 — End: 2023-05-08

## 2022-12-18 MED ORDER — TOPIRAMATE 25 MG PO TABS: 25.0000 mg | ORAL_TABLET | Freq: Every day | ORAL | 1 refills | Status: DC

## 2022-12-18 MED ORDER — POTASSIUM CHLORIDE CRYS ER 20 MEQ PO TBCR: 40.0000 meq | EXTENDED_RELEASE_TABLET | Freq: Every day | ORAL | 1 refills | Status: AC

## 2022-12-18 MED ORDER — SUMATRIPTAN SUCCINATE 100 MG PO TABS: ORAL_TABLET | ORAL | 3 refills | Status: DC

## 2022-12-18 MED ORDER — HYDROXYZINE PAMOATE 25 MG PO CAPS: 25.0000 mg | ORAL_CAPSULE | Freq: Every evening | ORAL | 3 refills | Status: DC | PRN

## 2022-12-18 MED ORDER — ONDANSETRON HCL 4 MG PO TABS: 4.0000 mg | ORAL_TABLET | Freq: Four times a day (QID) | ORAL | 0 refills | Status: DC | PRN

## 2022-12-18 NOTE — Progress Notes (Signed)
Patient ID: ORELLA ARENA, female    DOB: 01/16/1976  MRN: [id number]  CC: Diabetes (DM f/u. Med refills /Reports that anxiety med is not helping/Migraines med is not working. Bonnetta Barry to flu vax.)   Subjective: Marisa [name] is a 47 y.o. female who presents for chronic ds management.  Previous PCP is Dr. Delford Field who has retired.  Patient has her young daughter with her. Her concerns today include:  Patient with history of DM type II, HL, multinodular thyroid, MDD/GAD/PTSD, obesity, migraine headaches, OSA (mild)not on CPAP, intrinsic asthma,  DM: Results for orders placed or performed in visit on 12/18/22  POCT glycosylated hemoglobin (Hb A1C)  Result Value Ref Range   Hemoglobin A1C     HbA1c POC (<> result, manual entry)     HbA1c, POC (prediabetic range)     HbA1c, POC (controlled diabetic range) 5.5 0.0 - 7.0 %  POCT glucose (manual entry)  Result Value Ref Range   POC Glucose 93 70 - 99 mg/dl  Was on Rybelsus, Metformin and Lipitor but d/c after wgh reduction surgery/gastric sleeve in 2022. Doing well with eating habits.  Eat smaller portions.  She walks about 2 miles a day.  Prior to her surgery she weighed 275 pounds. Taking Bariatric MV.  Should be on Vit D but out of it.   Over due for eye exam Reports chronic numbness RT thumb for yrs  GAD/MDD:  on Zoloft for over 1 yr.  Feels it is not helping.  Makes her feel jittery.  Reports she was on Xanax yrs ago by a mental health provider and this worked the best for her. Sees a grief counselor through hospice x 6 mths.  She lost her mother.  Reports more frequent migraines occurring daily for the past 1 month.   Sharp pains on both sides of head Endorses nausea, blurred vision, photophobia; dizziness sometimes with the headaches.  Has not identified any triggers.  Sumatriptan does not help.  Takes Ibuprofen with Tylenol 3x/day Patient Active Problem List   Diagnosis Date Noted   Positive blood culture 12/13/2022    Bacteremia 09/16/2022   Nausea and vomiting 09/16/2022   Vaginal itching 09/15/2022   Lactic acidosis 09/15/2022   GI bleed 09/14/2022   Colitis 09/14/2022   Elevated liver enzymes 09/14/2022   Elevated MCV 09/14/2022   Hypokalemia 09/14/2022   Multinodular thyroid 03/01/2021   Solitary pulmonary nodule 02/21/2021   Frequent falls 02/21/2021   Weakness of both lower extremities 02/21/2021   Carpal tunnel syndrome of right wrist 02/21/2021   Acute constipation 02/21/2021   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   Morbid obesity (HCC) 12/24/2020   Allergic rhinitis 06/26/2020   Hyperlipidemia associated with type 2 diabetes mellitus (HCC) 04/10/2020   Chronic pain syndrome 04/09/2020   Type 2 diabetes mellitus with diabetic neuropathy, without long-term current use of insulin (HCC) 04/09/2020   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 12/13/2019   Vitamin D deficiency 04/05/2019   Class 2 severe obesity with serious comorbidity and body mass index (BMI) of 36.0 to 36.9 in adult (HCC) 04/05/2019   Obesity (BMI 30-39.9) 03/05/2018   Chronic diarrhea    Benign neoplasm of transverse colon    Benign neoplasm of sigmoid colon    Neuropathic pain of both legs 06/04/2016   Rash and nonspecific skin eruption 07/12/2015   Dandruff 03/26/2015   Migraine variant with headache 05/09/2014   GAD (generalized anxiety disorder) 05/04/2014   Panic disorder with  agoraphobia 05/04/2014   Social anxiety disorder 05/04/2014   PTSD (post-traumatic stress disorder) 05/04/2014   Depression 03/27/2014   OSA (obstructive sleep apnea) 03/06/2014   Insomnia 03/06/2014   History of cardiac arrest    Chest pain 02/20/2014   Rectal bleeding 07/25/2013   S/P Total vaginal hysterectomy on 01/06/13 01/06/2013   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     Current Outpatient Medications on File Prior to Visit  Medication Sig Dispense Refill   Accu-Chek Softclix Lancets lancets Use as  instructed 100 each 12   albuterol (PROVENTIL HFA) 108 (90 Base) MCG/ACT inhaler Inhale 2 puffs into the lungs every 6 (six) hours as needed for wheezing or shortness of breath. 6.7 g 2   albuterol (PROVENTIL) (2.5 MG/3ML) 0.083% nebulizer solution Take 3 mLs (2.5 mg total) by nebulization every 6 (six) hours as needed for wheezing or shortness of breath. 150 mL 1   Blood Glucose Monitoring Suppl (ACCU-CHEK GUIDE) w/Device KIT Check blood sugars daily 1 kit 0   EPINEPHrine 0.3 mg/0.3 mL IJ SOAJ injection Inject 0.3 mg into the muscle as needed for anaphylaxis. 1 each 1   gabapentin (NEURONTIN) 300 MG capsule Take 1 capsule (300 mg total) by mouth 2 (two) times daily. 180 capsule 1   glucose blood (ACCU-CHEK GUIDE) test strip Use as instructed 100 each 12   Lancets Misc. (ACCU-CHEK SOFTCLIX LANCET DEV) KIT Use to check blood sugar 1 kit 0   Multiple Vitamins-Minerals (BARIATRIC MULTIVITAMINS/IRON PO) Take 1 tablet by mouth daily.     No current facility-administered medications on file prior to visit.    Allergies  Allergen Reactions   Asa [Aspirin] Anaphylaxis and Hives    Hives, chest tightness    Mushroom Extract Complex Anaphylaxis, Swelling and Other (See Comments)    Reaction:  Eye swelling   Penicillins Anaphylaxis    Anaphylaxis ~47 yo necessitating ED visit (took pink thick liquid) - cannot recall taking any cephalosporins   Shellfish Allergy Anaphylaxis   Triamcinolone Other (See Comments)    Skin issues     Social History   Socioeconomic History   Marital status: Married    Spouse name: Not on file   Number of children: 1   Years of education: Not on file   Highest education level: Not on file  Occupational History   Occupation: disability  Tobacco Use   Smoking status: Former    Current packs/day: 0.00    Average packs/day: 0.2 packs/day for 11.0 years (2.2 ttl pk-yrs)    Types: Cigarettes    Start date: 11/18/2002    Quit date: 11/17/2013    Years since quitting:  9.0   Smokeless tobacco: Never   Tobacco comments:    2 years quit  Vaping Use   Vaping status: Never Used  Substance and Sexual Activity   Alcohol use: No    Alcohol/week: 0.0 standard drinks of alcohol   Drug use: No   Sexual activity: Yes    Birth control/protection: Surgical  Other Topics Concern   Not on file  Social History Narrative   Lives with sister   Drinks no caffeine   Right Handed    Lives in a one story home    Social Determinants of Health   Financial Resource Strain: Low Risk  (07/01/2022)   Overall Financial Resource Strain (CARDIA)    Difficulty of Paying Living Expenses: Not hard at all  Food Insecurity: No Food Insecurity (09/19/2022)   Hunger Vital Sign    Worried  About Running Out of Food in the Last Year: Never true    Ran Out of Food in the Last Year: Never true  Transportation Needs: No Transportation Needs (09/19/2022)   PRAPARE - Administrator, Civil Service (Medical): No    Lack of Transportation (Non-Medical): No  Physical Activity: Inactive (07/01/2022)   Exercise Vital Sign    Days of Exercise per Week: 0 days    Minutes of Exercise per Session: 0 min  Stress: No Stress Concern Present (07/01/2022)   [employer] of Occupational Health - Occupational Stress Questionnaire    Feeling of Stress : Not at all  Social Connections: Moderately Isolated (07/01/2022)   Social Connection and Isolation Panel [NHANES]    Frequency of Communication with Friends and Family: More than three times a week    Frequency of Social Gatherings with Friends and Family: Three times a week    Attends Religious Services: Never    Active Member of Clubs or Organizations: No    Attends Banker Meetings: Never    Marital Status: Married  Catering manager Violence: Not At Risk (09/14/2022)   Humiliation, Afraid, Rape, and Kick questionnaire    Fear of Current or Ex-Partner: No    Emotionally Abused: No    Physically Abused: No    Sexually  Abused: No    Family History  Problem Relation Age of Onset   Hypertension Mother    Diabetes Mother    Allergies Mother    Heart disease Mother    Clotting disorder Mother    Stroke Mother    Kidney disease Mother    Thyroid disease Mother    Cancer Father    Hyperlipidemia Father    Hypertension Father    Colon cancer Father    Liver disease Father    Heart disease Maternal Grandmother    Breast cancer Maternal Grandmother 54   Schizophrenia Sister    Bipolar disorder Sister    Clotting disorder Sister    Bipolar disorder Brother    Kidney disease Brother    Bipolar disorder Sister    Pancreatic cancer Maternal Aunt    Prostate cancer Maternal Uncle    Liver cancer Maternal Grandfather    Rectal cancer Maternal Grandfather    Liver cancer Paternal Grandfather    Colon polyps Neg Hx    Esophageal cancer Neg Hx    Stomach cancer Neg Hx     Past Surgical History:  Procedure Laterality Date   ABDOMINAL HYSTERECTOMY     partial   COLONOSCOPY Left 04/29/2013   Procedure: COLONOSCOPY;  Surgeon: Willis Modena, MD;  Location: [facility];  Service: Endoscopy;  Laterality: Left;   COLONOSCOPY     COLONOSCOPY WITH PROPOFOL N/A 08/01/2016   Procedure: COLONOSCOPY WITH PROPOFOL;  Surgeon: Sherrilyn Rist, MD;  Location: [facility];  Service: Gastroenterology;  Laterality: N/A;   ECTOPIC PREGNANCY SURGERY     ESOPHAGOGASTRODUODENOSCOPY (EGD) WITH PROPOFOL N/A 11/10/2018   Procedure: ESOPHAGOGASTRODUODENOSCOPY (EGD) WITH PROPOFOL;  Surgeon: Sherrilyn Rist, MD;  Location: [facility];  Service: Gastroenterology;  Laterality: N/A;   EVALUATION UNDER ANESTHESIA WITH FISTULECTOMY N/A 04/20/2014   Procedure: EXAM UNDER ANESTHESIA ;  Surgeon: Romie Levee, MD;  Location: [facility];  Service: General;  Laterality: N/A;   FLEXIBLE SIGMOIDOSCOPY N/A 11/09/2013   Procedure: FLEXIBLE SIGMOIDOSCOPY;  Surgeon: Willis Modena, MD;  Location: [facility];   Service: Endoscopy;  Laterality: N/A;   fupa removal  LAPAROSCOPIC CHOLECYSTECTOMY  2005   LAPAROSCOPIC GASTRIC SLEEVE RESECTION N/A 12/24/2020   Procedure: LAPAROSCOPIC GASTRIC SLEEVE RESECTION;  Surgeon: Berna Bue, MD;  Location: [facility];  Service: General;  Laterality: N/A;   REFRACTIVE SURGERY     SPHINCTEROTOMY N/A 04/20/2014   Procedure:  LATERAL INTERNAL SPHINCTEROTOMY;  Surgeon: Romie Levee, MD;  Location: [facility];  Service: General;  Laterality: N/A;   TRANSTHORACIC ECHOCARDIOGRAM  12/30/2012   mild LVH/  ef 55-60%   UNILATERAL SALPINGECTOMY  2009   laparotomy left salpingectomy-- ectopic preg.   UPPER GASTROINTESTINAL ENDOSCOPY     UPPER GI ENDOSCOPY N/A 12/24/2020   Procedure: UPPER GI ENDOSCOPY;  Surgeon: Berna Bue, MD;  Location: [facility];  Service: General;  Laterality: N/A;   VAGINAL HYSTERECTOMY N/A 01/06/2013   Procedure: HYSTERECTOMY VAGINAL;  Surgeon: Tereso Newcomer, MD;  Location: [facility];  Service: Gynecology;  Laterality: N/A;    ROS: Review of Systems Negative except as stated above  PHYSICAL EXAM: BP 108/75   Pulse 87   Temp 97.9 F (36.6 C) (Oral)   Ht 5\' 5"  (1.651 m)   Wt 215 lb (97.5 kg)   LMP 11/27/2012   SpO2 98%   BMI 35.78 kg/m   Wt Readings from Last 3 Encounters:  12/18/22 215 lb (97.5 kg)  12/11/22 215 lb (97.5 kg)  12/10/22 210 lb (95.3 kg)    Physical Exam  General appearance - alert, well appearing, and in no distress.  Patient has her young daughter with her who is very hyperactive with intermittent screaming and crying Mental status - normal mood, behavior, speech, dress, motor activity, and thought processes Chest - clear to auscultation, no wheezes, rales or rhonchi, symmetric air entry Heart - normal rate, regular rhythm, normal S1, S2, no murmurs, rubs, clicks or gallops Neurological - cranial nerves II through XII intact, normal muscle tone, no tremors, strength 5/5 Extremities - peripheral  pulses normal, no pedal edema, no clubbing or cyanosis     12/18/2022    2:01 PM 09/04/2022   10:08 AM 07/01/2022   12:00 PM  Depression screen PHQ 2/9  Decreased Interest 2 1 0  Down, Depressed, Hopeless 2 1 0  PHQ - 2 Score 4 2 0  Altered sleeping 0 2   Tired, decreased energy 3 2   Change in appetite 2 0   Feeling bad or failure about yourself  2 0   Trouble concentrating 2 1   Moving slowly or fidgety/restless 0 0   Suicidal thoughts 0 0   PHQ-9 Score 13 7   Difficult doing work/chores Very difficult        12/18/2022    2:02 PM 09/04/2022   10:08 AM 02/21/2021   11:15 AM 01/02/2021   10:58 AM  GAD 7 : Generalized Anxiety Score  Nervous, Anxious, on Edge 3 3 3 2   Control/stop worrying 3 2 1  0  Worry too much - different things 3 2 2  0  Trouble relaxing 3 2 3  0  Restless 2 2 0 0  Easily annoyed or irritable 2 1 0 0  Afraid - awful might happen 1 1 0 0  Total GAD 7 Score 17 13 9 2   Anxiety Difficulty Somewhat difficult           Latest Ref Rng & Units 09/17/2022   11:40 PM 09/17/2022   12:14 AM 09/16/2022   12:21 AM  CMP  Glucose 70 - 99 mg/dL 161  85  115   BUN 6 - 20 mg/dL <5  <5  <5   Creatinine 0.44 - 1.00 mg/dL 0.98  1.19  1.47   Sodium 135 - 145 mmol/L 135  135  136   Potassium 3.5 - 5.1 mmol/L 3.3  3.4  3.2   Chloride 98 - 111 mmol/L 102  101  97   CO2 22 - 32 mmol/L 21  22  27    Calcium 8.9 - 10.3 mg/dL 8.7  8.1  8.1    Lipid Panel     Component Value Date/Time   CHOL 183 09/04/2022 1038   TRIG 271 (H) 09/04/2022 1038   HDL 47 09/04/2022 1038   CHOLHDL 3.9 09/04/2022 1038   CHOLHDL 5.2 09/29/2013 1028   VLDL 44 (H) 09/29/2013 1028   LDLCALC 91 09/04/2022 1038    CBC    Component Value Date/Time   WBC 7.5 09/16/2022 0021   RBC 3.83 (L) 09/16/2022 0021   HGB 12.6 09/16/2022 0021   HGB 14.6 01/02/2021 1116   HCT 37.4 09/16/2022 0021   HCT 43.4 01/02/2021 1116   PLT 414 (H) 09/16/2022 0021   PLT 433 01/02/2021 1116   MCV 97.7 09/16/2022 0021    MCV 92 01/02/2021 1116   MCH 32.9 09/16/2022 0021   MCHC 33.7 09/16/2022 0021   RDW 12.2 09/16/2022 0021   RDW 11.9 01/02/2021 1116   LYMPHSABS 2.9 10/27/2021 1920   LYMPHSABS 3.0 01/02/2021 1116   MONOABS 0.3 10/27/2021 1920   EOSABS 0.0 10/27/2021 1920   EOSABS 0.2 01/02/2021 1116   BASOSABS 0.0 10/27/2021 1920   BASOSABS 0.1 01/02/2021 1116    ASSESSMENT AND PLAN: 1. Diabetes mellitus type 2, diet-controlled (HCC) Diet control and likely cured since weight reduction surgery.  Encouraged her to continue eating smaller portions and regular exercise. - POCT glycosylated hemoglobin (Hb A1C) - POCT glucose (manual entry) - Ambulatory referral to Ophthalmology - Microalbumin / creatinine urine ratio - Comprehensive metabolic panel - Lipid panel  2. Class 2 severe obesity with serious comorbidity and body mass index (BMI) of 35.0 to 35.9 in adult, unspecified obesity type (HCC) See #1 above.  Continue her bariatric multivitamin.  We will check vitamin D level today.  3. GAD (generalized anxiety disorder) Stop Zoloft.  We discussed changing to Cymbalta along with low-dose of hydroxyzine to take at bedtime.  Advised that the hydroxyzine can cause drowsiness.  Patient is willing to give it a try.  She is also agreeable to referral to behavioral health for medication management. - DULoxetine (CYMBALTA) 20 MG capsule; Take 1 capsule (20 mg total) by mouth daily.  Dispense: 30 capsule; Refill: 3 - hydrOXYzine (VISTARIL) 25 MG capsule; Take 1 capsule (25 mg total) by mouth at bedtime as needed for anxiety.  Dispense: 30 capsule; Refill: 3 - Ambulatory referral to Psychiatry  4. Mild major depression (HCC) See #3 above. - DULoxetine (CYMBALTA) 20 MG capsule; Take 1 capsule (20 mg total) by mouth daily.  Dispense: 30 capsule; Refill: 3 - Ambulatory referral to Psychiatry  6. Migraine variant with headache Patient with chronic migraine.  Recommend starting Topamax at bedtime to help  decrease the frequency of headaches.  Recommend Imitrex increased dose to 100 mg to take at the start of the headache. Discussed importance of healthy eating habits, regular exercise and getting an adequate sleep.  Managing stress is also important - Ambulatory referral to Neurology - SUMAtriptan (IMITREX) 100 MG tablet; Take 1 tablet at the start  of the headache.  Dispense: 12 tablet; Refill: 3 - topiramate (TOPAMAX) 25 MG tablet; Take 1 tablet (25 mg total) by mouth at bedtime. To decrease migraine frequency  Dispense: 30 tablet; Refill: 1  7. Vitamin D deficiency - VITAMIN D 25 Hydroxy (Vit-D Deficiency, Fractures)    Patient was given the opportunity to ask questions.  Patient verbalized understanding of the plan and was able to repeat key elements of the plan.   This documentation was completed using Paediatric nurse.  Any transcriptional errors are unintentional.  Orders Placed This Encounter  Procedures   VITAMIN D 25 Hydroxy (Vit-D Deficiency, Fractures)   Microalbumin / creatinine urine ratio   Comprehensive metabolic panel   Lipid panel   Ambulatory referral to Ophthalmology   Ambulatory referral to Psychiatry   Ambulatory referral to Neurology   POCT glycosylated hemoglobin (Hb A1C)   POCT glucose (manual entry)     Requested Prescriptions   Signed Prescriptions Disp Refills   potassium chloride SA (KLOR-CON M) 20 MEQ tablet 60 tablet 1    Sig: Take 2 tablets (40 mEq total) by mouth daily.   ondansetron (ZOFRAN) 4 MG tablet 20 tablet 0    Sig: Take 1 tablet (4 mg total) by mouth every 6 (six) hours as needed for nausea.   DULoxetine (CYMBALTA) 20 MG capsule 30 capsule 3    Sig: Take 1 capsule (20 mg total) by mouth daily.   hydrOXYzine (VISTARIL) 25 MG capsule 30 capsule 3    Sig: Take 1 capsule (25 mg total) by mouth at bedtime as needed for anxiety.   SUMAtriptan (IMITREX) 100 MG tablet 12 tablet 3    Sig: Take 1 tablet at the start of the  headache.   topiramate (TOPAMAX) 25 MG tablet 30 tablet 1    Sig: Take 1 tablet (25 mg total) by mouth at bedtime. To decrease migraine frequency    Return in about 4 months (around 04/17/2023).  Jonah Blue, MD, FACP

## 2022-12-18 NOTE — Telephone Encounter (Signed)
Chermaine calling from [facility] calling to report that there may be an interaction with ondansetron (ZOFRAN) 4 MG tablet [[id number]]  and hydrOXYzine (VISTARIL) 25 MG capsule [[id number]]  Please call to give an ok CB- [phone]

## 2022-12-18 NOTE — Patient Instructions (Signed)
Stop Zoloft.  We have prescribed Cymbalta 20 mg daily instead.  We have prescribed hydroxyzine 25 mg for you to use at bedtime to help with anxiety. Increase Imitrex/sumatriptan to 100 mg at the start of the migraine headaches.  Do not take more than 2 tablets in a 24-hour period.

## 2022-12-19 LAB — MICROALBUMIN / CREATININE URINE RATIO
Creatinine, Urine: 248.2 mg/dL
Microalb/Creat Ratio: 3 mg/g{creat} (ref 0–29)
Microalbumin, Urine: 8.5 ug/mL

## 2022-12-19 LAB — LIPID PANEL
Chol/HDL Ratio: 3.8 ratio (ref 0.0–4.4)
Cholesterol, Total: 194 mg/dL (ref 100–199)
HDL: 51 mg/dL (ref >39)
LDL Chol Calc (NIH): 117 mg/dL — ABNORMAL HIGH (ref 0–99)
Triglycerides: 150 mg/dL — ABNORMAL HIGH (ref 0–149)
VLDL Cholesterol Cal: 26 mg/dL (ref 5–40)

## 2022-12-19 LAB — COMPREHENSIVE METABOLIC PANEL
ALT: 31 [IU]/L (ref 0–32)
AST: 26 [IU]/L (ref 0–40)
Albumin: 4.1 g/dL (ref 3.9–4.9)
Alkaline Phosphatase: 83 [IU]/L (ref 44–121)
BUN/Creatinine Ratio: 15 (ref 9–23)
BUN: 10 mg/dL (ref 6–24)
Bilirubin Total: 0.4 mg/dL (ref 0.0–1.2)
CO2: 21 mmol/L (ref 20–29)
Calcium: 9.6 mg/dL (ref 8.7–10.2)
Chloride: 104 mmol/L (ref 96–106)
Creatinine, Ser: 0.67 mg/dL (ref 0.57–1.00)
Globulin, Total: 3 g/dL (ref 1.5–4.5)
Glucose: 88 mg/dL (ref 70–99)
Potassium: 4.5 mmol/L (ref 3.5–5.2)
Sodium: 140 mmol/L (ref 134–144)
Total Protein: 7.1 g/dL (ref 6.0–8.5)
eGFR: 108 mL/min/{1.73_m2} (ref >59)

## 2022-12-19 LAB — VITAMIN D 25 HYDROXY (VIT D DEFICIENCY, FRACTURES): Vit D, 25-Hydroxy: 18.6 ng/mL — ABNORMAL LOW (ref 30.0–100.0)

## 2022-12-20 ENCOUNTER — Other Ambulatory Visit: Payer: Self-pay | Admitting: Internal Medicine

## 2022-12-20 MED ORDER — ATORVASTATIN CALCIUM 10 MG PO TABS: 10.0000 mg | ORAL_TABLET | Freq: Every day | ORAL | 1 refills | Status: DC

## 2022-12-20 MED ORDER — VITAMIN D (ERGOCALCIFEROL) 1.25 MG (50000 UNIT) PO CAPS: 50000.0000 [IU] | ORAL_CAPSULE | ORAL | 1 refills | Status: DC

## 2022-12-23 NOTE — Telephone Encounter (Signed)
Spoke with Chermaine advised that per provider and pharmacist it is ok to fill medication

## 2023-01-14 NOTE — Progress Notes (Signed)
Anesthesia Review:  PCP: Jonah Blue LVO 12/18/22  Cardiologist : Chest x-ray : EKG : 09/16/22  Echo : Stress test: Cardiac Cath :  Activity level:  Sleep Study/ CPAP : no cpap  Fasting Blood Sugar :      / Checks Blood Sugar -- times a day:   Blood Thinner/ Instructions /Last Dose: ASA / Instructions/ Last Dose :    DM- type 2  12/18/22- [id number]-6.1  GI Bleed- in ED 08/2022

## 2023-01-21 ENCOUNTER — Encounter (HOSPITAL_COMMUNITY): Payer: Self-pay | Admitting: Gastroenterology

## 2023-01-21 NOTE — Anesthesia Preprocedure Evaluation (Signed)
Anesthesia Evaluation  Patient identified by MRN, date of birth, ID band Patient awake    Reviewed: Allergy & Precautions, NPO status , Patient's Chart, lab work & pertinent test results  History of Anesthesia Complications Negative for: history of anesthetic complications  Airway Mallampati: III  TM Distance: >3 FB    Comment: Previous grade I view with MAC 3, easy mask Dental  (+) Dental Advisory Given   Pulmonary neg shortness of breath, asthma , sleep apnea , neg COPD, neg recent URI, former smoker Pulmonary nodule   Pulmonary exam normal breath sounds clear to auscultation       Cardiovascular (-) hypertension(-) angina (-) Past MI, (-) Cardiac Stents and (-) CABG (-) dysrhythmias  Rhythm:Regular Rate:Normal  HLD   Neuro/Psych  Headaches, neg Seizures PSYCHIATRIC DISORDERS (panic disorder, PTSD) Anxiety Depression     Neuromuscular disease (lumbar stenosis, neuropathy)    GI/Hepatic Neg liver ROS, PUD, Bowel prep,GERD  ,,S/p laparoscopic gastric sleeve resection 12/24/2020   Endo/Other  diabetes, Type 2  Multinodular thyroid  Renal/GU negative Renal ROS     Musculoskeletal   Abdominal  (+) + obese  Peds  Hematology  (+) Blood dyscrasia, anemia Lab Results      Component                Value               Date                      WBC                      7.5                 09/16/2022                HGB                      12.6                09/16/2022                HCT                      37.4                09/16/2022                MCV                      97.7                09/16/2022                PLT                      414 (H)             09/16/2022              Anesthesia Other Findings   Reproductive/Obstetrics                             Anesthesia Physical Anesthesia Plan  ASA: 3  Anesthesia Plan: MAC   Post-op Pain Management: Minimal or no pain anticipated    Induction: Intravenous  PONV Risk Score and Plan: 2 and Propofol  infusion, TIVA and Treatment may vary due to age or medical condition  Airway Management Planned: Natural Airway and Nasal Cannula  Additional Equipment:   Intra-op Plan:   Post-operative Plan:   Informed Consent: I have reviewed the patients History and Physical, chart, labs and discussed the procedure including the risks, benefits and alternatives for the proposed anesthesia with the patient or authorized representative who has indicated his/her understanding and acceptance.     Dental advisory given  Plan Discussed with: CRNA and Anesthesiologist  Anesthesia Plan Comments: (Discussed with patient risks of MAC including, but not limited to, minor pain or discomfort, hearing people in the room, and possible need for backup general anesthesia. Risks for general anesthesia also discussed including, but not limited to, sore throat, hoarse voice, chipped/damaged teeth, injury to vocal cords, nausea and vomiting, allergic reactions, lung infection, heart attack, stroke, and death. All questions answered. )       Anesthesia Quick Evaluation

## 2023-01-22 ENCOUNTER — Encounter (HOSPITAL_COMMUNITY)
Admission: RE | Disposition: A | Discharge status: Home / Self Care | Payer: Self-pay | Source: Home / Self Care | Attending: Gastroenterology

## 2023-01-22 ENCOUNTER — Ambulatory Visit (HOSPITAL_COMMUNITY): Payer: Self-pay | Admitting: Anesthesiology

## 2023-01-22 ENCOUNTER — Ambulatory Visit (HOSPITAL_COMMUNITY): Payer: Medicare HMO | Admitting: Anesthesiology

## 2023-01-22 ENCOUNTER — Encounter (HOSPITAL_COMMUNITY): Payer: Self-pay | Admitting: Gastroenterology

## 2023-01-22 ENCOUNTER — Ambulatory Visit (HOSPITAL_COMMUNITY)
Admission: RE | Admit: 2023-01-22 | Discharge: 2023-01-22 | Disposition: A | Discharge status: Home / Self Care | Payer: Medicare HMO | Attending: Gastroenterology | Admitting: Gastroenterology

## 2023-01-22 ENCOUNTER — Other Ambulatory Visit: Payer: Self-pay

## 2023-01-22 DIAGNOSIS — K219 Gastro-esophageal reflux disease without esophagitis: Secondary | ICD-10-CM | POA: Insufficient documentation

## 2023-01-22 DIAGNOSIS — K589 Irritable bowel syndrome without diarrhea: Secondary | ICD-10-CM | POA: Diagnosis not present

## 2023-01-22 DIAGNOSIS — K640 First degree hemorrhoids: Secondary | ICD-10-CM | POA: Insufficient documentation

## 2023-01-22 DIAGNOSIS — R933 Abnormal findings on diagnostic imaging of other parts of digestive tract: Secondary | ICD-10-CM

## 2023-01-22 DIAGNOSIS — Z860101 Personal history of adenomatous and serrated colon polyps: Secondary | ICD-10-CM | POA: Diagnosis not present

## 2023-01-22 DIAGNOSIS — J45909 Unspecified asthma, uncomplicated: Secondary | ICD-10-CM | POA: Insufficient documentation

## 2023-01-22 DIAGNOSIS — Z9884 Bariatric surgery status: Secondary | ICD-10-CM | POA: Diagnosis not present

## 2023-01-22 DIAGNOSIS — K625 Hemorrhage of anus and rectum: Secondary | ICD-10-CM | POA: Insufficient documentation

## 2023-01-22 DIAGNOSIS — G4733 Obstructive sleep apnea (adult) (pediatric): Secondary | ICD-10-CM | POA: Diagnosis not present

## 2023-01-22 DIAGNOSIS — G8929 Other chronic pain: Secondary | ICD-10-CM | POA: Diagnosis not present

## 2023-01-22 DIAGNOSIS — E1140 Type 2 diabetes mellitus with diabetic neuropathy, unspecified: Secondary | ICD-10-CM | POA: Insufficient documentation

## 2023-01-22 DIAGNOSIS — K5730 Diverticulosis of large intestine without perforation or abscess without bleeding: Secondary | ICD-10-CM | POA: Diagnosis not present

## 2023-01-22 DIAGNOSIS — K5733 Diverticulitis of large intestine without perforation or abscess with bleeding: Secondary | ICD-10-CM | POA: Diagnosis not present

## 2023-01-22 DIAGNOSIS — K529 Noninfective gastroenteritis and colitis, unspecified: Secondary | ICD-10-CM | POA: Diagnosis not present

## 2023-01-22 DIAGNOSIS — K5790 Diverticulosis of intestine, part unspecified, without perforation or abscess without bleeding: Secondary | ICD-10-CM | POA: Diagnosis not present

## 2023-01-22 DIAGNOSIS — E119 Type 2 diabetes mellitus without complications: Secondary | ICD-10-CM | POA: Diagnosis not present

## 2023-01-22 DIAGNOSIS — K649 Unspecified hemorrhoids: Secondary | ICD-10-CM | POA: Diagnosis not present

## 2023-01-22 HISTORY — PX: COLONOSCOPY WITH PROPOFOL: SHX5780

## 2023-01-22 HISTORY — PX: BIOPSY: SHX5522

## 2023-01-22 LAB — GLUCOSE, CAPILLARY: Glucose-Capillary: 83 mg/dL (ref 70–99)

## 2023-01-22 SURGERY — COLONOSCOPY WITH PROPOFOL
Anesthesia: Monitor Anesthesia Care

## 2023-01-22 MED ORDER — SODIUM CHLORIDE 0.9 % IV SOLN: INTRAVENOUS | Status: DC

## 2023-01-22 MED ORDER — PROPOFOL 10 MG/ML IV BOLUS
INTRAVENOUS | Status: DC | PRN
  Administered 2023-01-22: 20 mg via INTRAVENOUS
  Administered 2023-01-22: 100 mg via INTRAVENOUS
  Administered 2023-01-22: 40 mg via INTRAVENOUS
  Administered 2023-01-22: 60 mg via INTRAVENOUS
  Administered 2023-01-22: 90 mg via INTRAVENOUS

## 2023-01-22 MED ORDER — LIDOCAINE HCL (CARDIAC) PF 100 MG/5ML IV SOSY
PREFILLED_SYRINGE | INTRAVENOUS | Status: DC | PRN
  Administered 2023-01-22: 60 mg via INTRAVENOUS

## 2023-01-22 MED ORDER — PROPOFOL 500 MG/50ML IV EMUL
INTRAVENOUS | Status: AC
  Filled 2023-01-22: qty 50

## 2023-01-22 SURGICAL SUPPLY — 21 items
ELECT REM PT RETURN 9FT ADLT (ELECTROSURGICAL)
ELECTRODE REM PT RTRN 9FT ADLT (ELECTROSURGICAL) IMPLANT
FCP BXJMBJMB 240X2.8X (CUTTING FORCEPS)
FLOOR PAD 36X40 (MISCELLANEOUS) ×2
FORCEPS BIOP RAD 4 LRG CAP 4 (CUTTING FORCEPS) IMPLANT
FORCEPS BIOP RJ4 240 W/NDL (CUTTING FORCEPS)
FORCEPS BXJMBJMB 240X2.8X (CUTTING FORCEPS) IMPLANT
INJECTOR/SNARE I SNARE (MISCELLANEOUS) IMPLANT
LUBRICANT JELLY 4.5OZ STERILE (MISCELLANEOUS) IMPLANT
MANIFOLD NEPTUNE II (INSTRUMENTS) IMPLANT
NDL SCLEROTHERAPY 25GX240 (NEEDLE) IMPLANT
NEEDLE SCLEROTHERAPY 25GX240 (NEEDLE)
PAD FLOOR 36X40 (MISCELLANEOUS) ×2 IMPLANT
PROBE APC STR FIRE (PROBE) IMPLANT
PROBE INJECTION GOLD 7FR (MISCELLANEOUS) IMPLANT
SNARE ROTATE MED OVAL 20MM (MISCELLANEOUS) IMPLANT
SYR 50ML LL SCALE MARK (SYRINGE) IMPLANT
TRAP SPECIMEN MUCOUS 40CC (MISCELLANEOUS) IMPLANT
TUBING ENDO SMARTCAP PENTAX (MISCELLANEOUS) IMPLANT
TUBING IRRIGATION ENDOGATOR (MISCELLANEOUS) ×2 IMPLANT
WATER STERILE IRR 1000ML POUR (IV SOLUTION) IMPLANT

## 2023-01-22 NOTE — Addendum Note (Signed)
Addendum  created 01/22/23 1429 by Dennison Nancy, CRNA   Attestation recorded in [place], Intraprocedure Attestations filed

## 2023-01-22 NOTE — H&P (Signed)
History and Physical:  This patient presents for endoscopic testing for: Chronic diarrhea and rectal bleeding History of colon polyps  Clinical details outlined in 12/11/22 office consult note, with no significant clinical changes since then.  Patient is otherwise without complaints or active issues today.   Past Medical History: Past Medical History:  Diagnosis Date   Abdominal pain 04/26/2013   Abnormal uterine bleeding (AUB) 10/11/2012   Acute bronchitis    Allergy    Anal pain    chronic   Anemia    Anxiety    Asthma    exacerbation 02-28-2014 and 02-23-2014 secondary to Rhinovirus   Atypical chest pain 04/26/2013   Benign neoplasm of sigmoid colon    Benign neoplasm of transverse colon    Carbuncle of labium 07/12/2015   Chest pain 02/20/2014   Chronic diarrhea    Chronic headaches    Chronic low back pain    Cigarette nicotine dependence without complication 05/04/2014   Cyst of right ovary    Dandruff 03/26/2015   Diabetes mellitus without complication (HCC) 10/13/2013   Diabetic neuropathy, painful (HCC) 12/13/2019   Difficult intravenous access    PER PT NEEDS PICC LINE   Dyspnea 09/07/2012   Cleda Daub 08/2012:  No obstruction by FEV1%, but probable restriction.     Falls 03/27/2014   Food allergy    Mushrooms, shellfish   GAD (generalized anxiety disorder) 05/04/2014   Gait disturbance 04/11/2014   Gait instability    GERD (gastroesophageal reflux disease)    History of adenomatous polyp of colon    History of cardiac arrest    during SVD 1992   History of ectopic pregnancy    2009-  S/P LEFT SALPINGECTOMY   History of panic attacks    Hyperlipidemia    IBS (irritable bowel syndrome)    Insomnia 03/06/2014   Joint pain    Lower extremity edema    Lumbar stenosis L4 -- L5 with bulging disk   w/ right leg weakness/ decreased mobility   Migraine variant with headache 05/09/2014   Mild obstructive sleep apnea    study 03-20-2014  no cpap recommended    Nausea and vomiting 09/16/2022   Neuromuscular disorder (HCC)    neuropathy in feet    Neuropathic pain of both legs 06/04/2016   Obesity (BMI 30-39.9) 03/05/2018   OSA (obstructive sleep apnea) 03/06/2014   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   Panniculitis 03/05/2018   Pelvic pain 07/25/2013   Persistent vomiting 04/27/2013   Pneumonia    PTSD (post-traumatic stress disorder) 05/04/2014   Rash and nonspecific skin eruption 07/12/2015   Rectal bleeding 07/25/2013   RLQ abdominal pain    S/P Total vaginal hysterectomy on 01/06/13 01/06/2013   Sleep apnea    mild no cpap   Social anxiety disorder 05/04/2014   Sore throat 02/20/2014   Stomach ulcer    Tachycardia 02/20/2014   Type 2 diabetes mellitus (HCC)    Weakness of right leg    FROM BACK PROBLEM PER PT     Past Surgical History: Past Surgical History:  Procedure Laterality Date   ABDOMINAL HYSTERECTOMY     partial   COLONOSCOPY Left 04/29/2013   Procedure: COLONOSCOPY;  Surgeon: Willis Modena, MD;  Location: [facility];  Service: Endoscopy;  Laterality: Left;   COLONOSCOPY     COLONOSCOPY WITH PROPOFOL N/A 08/01/2016   Procedure: COLONOSCOPY WITH PROPOFOL;  Surgeon: Sherrilyn Rist, MD;  Location: [facility];  Service: Gastroenterology;  Laterality:  N/A;   ECTOPIC PREGNANCY SURGERY     ESOPHAGOGASTRODUODENOSCOPY (EGD) WITH PROPOFOL N/A 11/10/2018   Procedure: ESOPHAGOGASTRODUODENOSCOPY (EGD) WITH PROPOFOL;  Surgeon: Sherrilyn Rist, MD;  Location: [facility];  Service: Gastroenterology;  Laterality: N/A;   EVALUATION UNDER ANESTHESIA WITH FISTULECTOMY N/A 04/20/2014   Procedure: EXAM UNDER ANESTHESIA ;  Surgeon: Romie Levee, MD;  Location: [facility];  Service: General;  Laterality: N/A;   FLEXIBLE SIGMOIDOSCOPY N/A 11/09/2013   Procedure: FLEXIBLE SIGMOIDOSCOPY;  Surgeon: Willis Modena, MD;  Location: [facility];  Service: Endoscopy;  Laterality: N/A;   fupa removal       LAPAROSCOPIC CHOLECYSTECTOMY  2005   LAPAROSCOPIC GASTRIC SLEEVE RESECTION N/A 12/24/2020   Procedure: LAPAROSCOPIC GASTRIC SLEEVE RESECTION;  Surgeon: Berna Bue, MD;  Location: [facility];  Service: General;  Laterality: N/A;   REFRACTIVE SURGERY     SPHINCTEROTOMY N/A 04/20/2014   Procedure:  LATERAL INTERNAL SPHINCTEROTOMY;  Surgeon: Romie Levee, MD;  Location: [facility];  Service: General;  Laterality: N/A;   TRANSTHORACIC ECHOCARDIOGRAM  12/30/2012   mild LVH/  ef 55-60%   UNILATERAL SALPINGECTOMY  2009   laparotomy left salpingectomy-- ectopic preg.   UPPER GASTROINTESTINAL ENDOSCOPY     UPPER GI ENDOSCOPY N/A 12/24/2020   Procedure: UPPER GI ENDOSCOPY;  Surgeon: Berna Bue, MD;  Location: [facility];  Service: General;  Laterality: N/A;   VAGINAL HYSTERECTOMY N/A 01/06/2013   Procedure: HYSTERECTOMY VAGINAL;  Surgeon: Tereso Newcomer, MD;  Location: [facility];  Service: Gynecology;  Laterality: N/A;    Allergies: Allergies  Allergen Reactions   Asa [Aspirin] Anaphylaxis and Hives    Hives, chest tightness    Mushroom Extract Complex Anaphylaxis, Swelling and Other (See Comments)    Reaction:  Eye swelling   Penicillins Anaphylaxis    Anaphylaxis ~47 yo necessitating ED visit (took pink thick liquid) - cannot recall taking any cephalosporins   Shellfish Allergy Anaphylaxis   Triamcinolone Other (See Comments)    Skin issues    Zoloft [Sertraline]     Jittery feeling    Outpatient Meds: Current Facility-Administered Medications  Medication Dose Route Frequency Provider Last Rate Last Admin   0.9 %  sodium chloride infusion   Intravenous Continuous Danis, Starr Lake III, MD          ___________________________________________________________________ Objective   Exam:  Wt 97.5 kg   LMP 11/27/2012   BMI 35.77 kg/m   CV: regular , S1/S2 Resp: clear to auscultation bilaterally, normal RR and effort noted GI: soft, no tenderness, with active  bowel sounds.   Assessment: Chronic diarrhea and rectal bleeding Hx colon polyps   Plan: Colonoscopy   The benefits and risks of the planned procedure were described in detail with the patient or (when appropriate) their health care proxy.  Risks were outlined as including, but not limited to, bleeding, infection, perforation, adverse medication reaction leading to cardiac or pulmonary decompensation, pancreatitis (if ERCP).  The limitation of incomplete mucosal visualization was also discussed.  No guarantees or warranties were given.  The patient is appropriate for an endoscopic procedure in the ambulatory setting.   - Amada Jupiter, MD

## 2023-01-22 NOTE — Op Note (Signed)
[facility] Patient Name: Marisa [name]-terry Procedure Date: 01/22/2023 MRN: [id number] Attending MD: Starr Lake. Myrtie Neither , MD, [id number] Date of Birth: [birth year] CSN: [id number] Age: 47 Admit Type: Outpatient Procedure:                Colonoscopy Indications:              Chronic diarrhea, Rectal bleeding-clinical details                            in office consult note dated 12/07/2022                           Patient also has a history of a diminutive                            adenomatous polyp in June 2018 Providers:                Starr Lake. Myrtie Neither, MD, Fransisca Connors, Marja Kays, Technician Referring MD:              Medicines:                Monitored Anesthesia Care Complications:            No immediate complications. Estimated Blood Loss:     Estimated blood loss was minimal. Procedure:                Pre-Anesthesia Assessment:                           - Prior to the procedure, a History and Physical                            was performed, and patient medications and                            allergies were reviewed. The patient's tolerance of                            previous anesthesia was also reviewed. The risks                            and benefits of the procedure and the sedation                            options and risks were discussed with the patient.                            All questions were answered, and informed consent                            was obtained. Prior Anticoagulants: The patient has                            taken  no anticoagulant or antiplatelet agents. ASA                            Grade Assessment: II - A patient with mild systemic                            disease. After reviewing the risks and benefits,                            the patient was deemed in satisfactory condition to                            undergo the procedure.                           After obtaining  informed consent, the colonoscope                            was passed under direct vision. Throughout the                            procedure, the patient's blood pressure, pulse, and                            oxygen saturations were monitored continuously. The                            CF-HQ190L ([id number]) Olympus colonoscope was                            introduced through the anus and advanced to the the                            terminal ileum, with identification of the                            appendiceal orifice and IC valve. The colonoscopy                            was performed without difficulty. The patient                            tolerated the procedure well. The quality of the                            bowel preparation was excellent. The terminal                            ileum, ileocecal valve, appendiceal orifice, and                            rectum were photographed. Scope In: 8:43:52 AM Scope Out: 8:55:33 AM Scope Withdrawal Time: 0 hours 9 minutes 53 seconds  Total Procedure Duration: 0 hours 11 minutes 41  seconds  Findings:      The perianal and digital rectal examinations were normal.      The terminal ileum appeared normal.      Diverticula were found in the left colon and right colon.      Normal mucosa was found in the entire colon. Biopsies for histology were       taken with a cold forceps from the right colon and left colon for       evaluation of microscopic colitis.      Internal hemorrhoids were found. The hemorrhoids were medium-sized and       Grade I (internal hemorrhoids that do not prolapse).      There is no endoscopic evidence of polyps in the entire colon.      The exam was otherwise without abnormality on direct and retroflexion       views. Impression:               - The examined portion of the ileum was normal.                           - Diverticulosis in the left colon and in the right                            colon.                            - Normal mucosa in the entire examined colon.                            Biopsied.                           - Internal hemorrhoids.                           - The examination was otherwise normal on direct                            and retroflexion views.                           If biopsies negative for microscopic colitis,                            overall clinical picture most consistent with IBS.                           Incidentally, this patient's procedure was done in                            the hospital outpatient endoscopy lab due to a                            reported history of difficult IV access. Peripheral                            IV was obtained on one attempt without the  need for                            specialized vein finding devices. Moderate Sedation:      MAC sedation used Recommendation:           - Patient has a contact number available for                            emergencies. The signs and symptoms of potential                            delayed complications were discussed with the                            patient. Return to normal activities tomorrow.                            Written discharge instructions were provided to the                            patient.                           - Resume previous diet.                           - Continue present medications.                           - Await pathology results.                           - Repeat colonoscopy in 10 years for surveillance.                           - Clinic follow-up with our APP will be arranged. Procedure Code(s):        --- Professional ---                           [phone], Colonoscopy, flexible; with biopsy, single                            or multiple Diagnosis Code(s):        --- Professional ---                           K64.0, First degree hemorrhoids                           K52.9, Noninfective gastroenteritis and colitis,                             unspecified                           K62.5, Hemorrhage of anus and rectum  K57.30, Diverticulosis of large intestine without                            perforation or abscess without bleeding CPT copyright 2022 [employer]. All rights reserved. The codes documented in this report are preliminary and upon coder review may  be revised to meet current compliance requirements. Darnette Lampron L. Myrtie Neither, MD 01/22/2023 9:04:19 AM This report has been signed electronically. Number of Addenda: 0

## 2023-01-22 NOTE — Interval H&P Note (Signed)
History and Physical Interval Note:  01/22/2023 7:35 AM  Marisa [name]  has presented today for surgery, with the diagnosis of rectal bleeding.  The various methods of treatment have been discussed with the patient and family. After consideration of risks, benefits and other options for treatment, the patient has consented to  Procedure(s): COLONOSCOPY WITH PROPOFOL (N/A) as a surgical intervention.  The patient's history has been reviewed, patient examined, no change in status, stable for surgery.  I have reviewed the patient's chart and labs.  Questions were answered to the patient's satisfaction.     Charlie Pitter III

## 2023-01-22 NOTE — Anesthesia Postprocedure Evaluation (Signed)
Anesthesia Post Note  Patient: Marisa [name]  Procedure(s) Performed: COLONOSCOPY WITH PROPOFOL BIOPSY     Patient location during evaluation: PACU Anesthesia Type: MAC Level of consciousness: awake Pain management: pain level controlled Vital Signs Assessment: post-procedure vital signs reviewed and stable Respiratory status: spontaneous breathing, nonlabored ventilation and respiratory function stable Cardiovascular status: stable and blood pressure returned to baseline Postop Assessment: no apparent nausea or vomiting Anesthetic complications: no   No notable events documented.  Last Vitals:  Vitals:   01/22/23 0910 01/22/23 0920  BP: (!) 91/56 101/65  Pulse: (!) 101 75  Resp: (!) 26 17  Temp:    SpO2: 97% 97%    Last Pain:  Vitals:   01/22/23 0920  TempSrc:   PainSc: 0-No pain                 [name]

## 2023-01-22 NOTE — Discharge Instructions (Signed)

## 2023-01-22 NOTE — Transfer of Care (Signed)
Immediate Anesthesia Transfer of Care Note  Patient: Marisa [name]  Procedure(s) Performed: COLONOSCOPY WITH PROPOFOL BIOPSY  Patient Location: Endoscopy Unit  Anesthesia Type:MAC  Level of Consciousness: drowsy  Airway & Oxygen Therapy: Patient Spontanous Breathing and Patient connected to face mask  Post-op Assessment: Report given to RN and Post -op Vital signs reviewed and stable  Post vital signs: Reviewed and stable  Last Vitals:  Vitals Value Taken Time  BP    Temp    Pulse 124 01/22/23 0900  Resp 18 01/22/23 0900  SpO2 98 % 01/22/23 0900  Vitals shown include unfiled device data.  Last Pain:  Vitals:   01/22/23 0700  TempSrc: Temporal  PainSc: 0-No pain         Complications: No notable events documented.

## 2023-01-26 ENCOUNTER — Encounter (HOSPITAL_COMMUNITY): Payer: Self-pay | Admitting: Gastroenterology

## 2023-01-26 LAB — SURGICAL PATHOLOGY

## 2023-01-30 ENCOUNTER — Other Ambulatory Visit: Payer: Self-pay

## 2023-01-30 MED ORDER — DICYCLOMINE HCL 10 MG PO CAPS: 10.0000 mg | ORAL_CAPSULE | Freq: Three times a day (TID) | ORAL | 1 refills | Status: DC

## 2023-02-24 ENCOUNTER — Ambulatory Visit: Payer: Medicare HMO | Admitting: Neurology

## 2023-02-24 ENCOUNTER — Encounter: Payer: Self-pay | Admitting: Neurology

## 2023-02-27 ENCOUNTER — Other Ambulatory Visit: Payer: Self-pay

## 2023-02-27 ENCOUNTER — Emergency Department (HOSPITAL_COMMUNITY)
Admission: EM | Admit: 2023-02-27 | Discharge: 2023-02-27 | Disposition: A | Discharge status: Home / Self Care | Payer: Medicare HMO | Attending: Emergency Medicine | Admitting: Emergency Medicine

## 2023-02-27 ENCOUNTER — Emergency Department (HOSPITAL_COMMUNITY): Payer: Medicare HMO

## 2023-02-27 DIAGNOSIS — Z043 Encounter for examination and observation following other accident: Secondary | ICD-10-CM | POA: Diagnosis not present

## 2023-02-27 DIAGNOSIS — R55 Syncope and collapse: Secondary | ICD-10-CM | POA: Insufficient documentation

## 2023-02-27 DIAGNOSIS — Z9049 Acquired absence of other specified parts of digestive tract: Secondary | ICD-10-CM | POA: Diagnosis not present

## 2023-02-27 DIAGNOSIS — M7731 Calcaneal spur, right foot: Secondary | ICD-10-CM | POA: Diagnosis not present

## 2023-02-27 DIAGNOSIS — R109 Unspecified abdominal pain: Secondary | ICD-10-CM | POA: Insufficient documentation

## 2023-02-27 DIAGNOSIS — M47812 Spondylosis without myelopathy or radiculopathy, cervical region: Secondary | ICD-10-CM | POA: Diagnosis not present

## 2023-02-27 DIAGNOSIS — M542 Cervicalgia: Secondary | ICD-10-CM | POA: Insufficient documentation

## 2023-02-27 DIAGNOSIS — W19XXXA Unspecified fall, initial encounter: Secondary | ICD-10-CM | POA: Diagnosis not present

## 2023-02-27 DIAGNOSIS — S99911A Unspecified injury of right ankle, initial encounter: Secondary | ICD-10-CM | POA: Diagnosis present

## 2023-02-27 DIAGNOSIS — R58 Hemorrhage, not elsewhere classified: Secondary | ICD-10-CM | POA: Diagnosis not present

## 2023-02-27 DIAGNOSIS — M79604 Pain in right leg: Secondary | ICD-10-CM | POA: Diagnosis not present

## 2023-02-27 DIAGNOSIS — M25571 Pain in right ankle and joints of right foot: Secondary | ICD-10-CM | POA: Diagnosis not present

## 2023-02-27 DIAGNOSIS — S93401A Sprain of unspecified ligament of right ankle, initial encounter: Secondary | ICD-10-CM | POA: Insufficient documentation

## 2023-02-27 DIAGNOSIS — S93491A Sprain of other ligament of right ankle, initial encounter: Secondary | ICD-10-CM | POA: Diagnosis not present

## 2023-02-27 DIAGNOSIS — Z90710 Acquired absence of both cervix and uterus: Secondary | ICD-10-CM | POA: Diagnosis not present

## 2023-02-27 DIAGNOSIS — G4489 Other headache syndrome: Secondary | ICD-10-CM | POA: Diagnosis not present

## 2023-02-27 DIAGNOSIS — R1031 Right lower quadrant pain: Secondary | ICD-10-CM | POA: Diagnosis not present

## 2023-02-27 LAB — CBC WITH DIFFERENTIAL/PLATELET
Abs Immature Granulocytes: 0.01 10*3/uL (ref 0.00–0.07)
Basophils Absolute: 0.1 10*3/uL (ref 0.0–0.1)
Basophils Relative: 1 %
Eosinophils Absolute: 0.1 10*3/uL (ref 0.0–0.5)
Eosinophils Relative: 1 %
HCT: 44.8 % (ref 36.0–46.0)
Hemoglobin: 15.1 g/dL — ABNORMAL HIGH (ref 12.0–15.0)
Immature Granulocytes: 0 %
Lymphocytes Relative: 65 %
Lymphs Abs: 4 10*3/uL (ref 0.7–4.0)
MCH: 31 pg (ref 26.0–34.0)
MCHC: 33.7 g/dL (ref 30.0–36.0)
MCV: 92 fL (ref 80.0–100.0)
Monocytes Absolute: 0.4 10*3/uL (ref 0.1–1.0)
Monocytes Relative: 6 %
Neutro Abs: 1.6 10*3/uL — ABNORMAL LOW (ref 1.7–7.7)
Neutrophils Relative %: 27 %
Platelets: 417 10*3/uL — ABNORMAL HIGH (ref 150–400)
RBC: 4.87 MIL/uL (ref 3.87–5.11)
RDW: 12 % (ref 11.5–15.5)
WBC: 6.1 10*3/uL (ref 4.0–10.5)
nRBC: 0 % (ref 0.0–0.2)

## 2023-02-27 LAB — COMPREHENSIVE METABOLIC PANEL
ALT: 58 U/L — ABNORMAL HIGH (ref 0–44)
AST: 63 U/L — ABNORMAL HIGH (ref 15–41)
Albumin: 3.6 g/dL (ref 3.5–5.0)
Alkaline Phosphatase: 63 U/L (ref 38–126)
Anion gap: 11 (ref 5–15)
BUN: 10 mg/dL (ref 6–20)
CO2: 23 mmol/L (ref 22–32)
Calcium: 8.4 mg/dL — ABNORMAL LOW (ref 8.9–10.3)
Chloride: 105 mmol/L (ref 98–111)
Creatinine, Ser: 0.69 mg/dL (ref 0.44–1.00)
GFR, Estimated: >60 mL/min (ref >60)
Glucose, Bld: 127 mg/dL — ABNORMAL HIGH (ref 70–99)
Potassium: 4.1 mmol/L (ref 3.5–5.1)
Sodium: 139 mmol/L (ref 135–145)
Total Bilirubin: 1.8 mg/dL — ABNORMAL HIGH (ref 0.0–1.2)
Total Protein: 7.3 g/dL (ref 6.5–8.1)

## 2023-02-27 LAB — CBG MONITORING, ED: Glucose-Capillary: 129 mg/dL — ABNORMAL HIGH (ref 70–99)

## 2023-02-27 LAB — POC OCCULT BLOOD, ED: Fecal Occult Bld: NEGATIVE

## 2023-02-27 MED ORDER — PANTOPRAZOLE SODIUM 40 MG PO TBEC
40.0000 mg | DELAYED_RELEASE_TABLET | Freq: Once | ORAL | Status: AC
  Administered 2023-02-27: 40 mg via ORAL
  Filled 2023-02-27: qty 1

## 2023-02-27 MED ORDER — HYDROCODONE-ACETAMINOPHEN 5-325 MG PO TABS
1.0000 | ORAL_TABLET | Freq: Once | ORAL | Status: AC
  Administered 2023-02-27: 1 via ORAL
  Filled 2023-02-27: qty 1

## 2023-02-27 MED ORDER — IOHEXOL 350 MG/ML SOLN
75.0000 mL | Freq: Once | INTRAVENOUS | Status: AC | PRN
  Administered 2023-02-27: 75 mL via INTRAVENOUS

## 2023-02-27 MED ORDER — PANTOPRAZOLE SODIUM 40 MG IV SOLR
40.0000 mg | Freq: Once | INTRAVENOUS | Status: DC
  Filled 2023-02-27: qty 10

## 2023-02-27 NOTE — ED Notes (Signed)
 CCMD called and verified pt on the monitor

## 2023-02-27 NOTE — ED Triage Notes (Signed)
 Pt BIB [facility] from home for GI bleed/syncope.  Tarry stools for unknown amt of time.  Today she had 2 syncopal episodes.  Pt fell and has pain right leg & ankle & forehead. Pt is A&O at this time.  140/96 95% HR 88, RR 20, 15 GCS

## 2023-02-27 NOTE — ED Provider Notes (Signed)
 [place] [facility]PARTMENT AT [facility] Provider Note   CSN: [id number] Arrival date & time: 02/27/23  1856     History  No chief complaint on file.   Marisa [name] is a 48 y.o. female.  HPI   Pt started having rectal bleeding a week ago.  Pt has noticed black stool.  Pt has not seen anyone for it since it started but today she felt lightheaded and had two syncopal spells.  Pt hit her head and also injured her right ankle.  Patient is having pain in her head as well as the back of her neck.  She also has pain in her right ankle.  Patient states she has been having abdominal discomfort.  Records indicate patient was recently evaluated for rectal bleeding last month.  Patient underwent a colonoscopy. Patient's colonoscopy showed diverticula but no active bleeding Home Medications Prior to Admission medications   Medication Sig Start Date End Date Taking? Authorizing Provider  dicyclomine  (BENTYL ) 10 MG capsule Take 1 capsule (10 mg total) by mouth 3 (three) times daily before meals. 01/30/23   Legrand Victory LITTIE DOUGLAS, MD  Accu-Chek Softclix Lancets lancets Use as instructed 09/04/22   Brien Belvie BRAVO, MD  albuterol  (PROVENTIL  HFA) 108 [phone] Base) MCG/ACT inhaler Inhale 2 puffs into the lungs every 6 (six) hours as needed for wheezing or shortness of breath. 09/04/22   Brien Belvie BRAVO, MD  albuterol  (PROVENTIL ) (2.5 MG/3ML) 0.083% nebulizer solution Take 3 mLs (2.5 mg total) by nebulization every 6 (six) hours as needed for wheezing or shortness of breath. 09/04/22   Brien Belvie BRAVO, MD  atorvastatin  (LIPITOR) 10 MG tablet Take 1 tablet (10 mg total) by mouth daily. 12/20/22   Vicci Barnie NOVAK, MD  Blood Glucose Monitoring Suppl (ACCU-CHEK GUIDE) w/Device KIT Check blood sugars daily 09/04/22   Brien Belvie BRAVO, MD  DULoxetine  (CYMBALTA ) 20 MG capsule Take 1 capsule (20 mg total) by mouth daily. 12/18/22   Vicci Barnie NOVAK, MD  EPINEPHrine  0.3 mg/0.3 mL IJ SOAJ  injection Inject 0.3 mg into the muscle as needed for anaphylaxis. 09/04/22   Brien Belvie BRAVO, MD  gabapentin  (NEURONTIN ) 300 MG capsule Take 1 capsule (300 mg total) by mouth 2 (two) times daily. 09/04/22   Brien Belvie BRAVO, MD  glucose blood (ACCU-CHEK GUIDE) test strip Use as instructed 09/04/22   Brien Belvie BRAVO, MD  hydrOXYzine  (VISTARIL ) 25 MG capsule Take 1 capsule (25 mg total) by mouth at bedtime as needed for anxiety. 12/18/22   Vicci Barnie NOVAK, MD  Lancets Misc. (ACCU-CHEK SOFTCLIX LANCET DEV) KIT Use to check blood sugar 09/04/22   Brien Belvie BRAVO, MD  Multiple Vitamins-Minerals (BARIATRIC MULTIVITAMINS/IRON PO) Take 1 tablet by mouth daily.    [provider]  ondansetron  (ZOFRAN ) 4 MG tablet Take 1 tablet (4 mg total) by mouth every 6 (six) hours as needed for nausea. 12/18/22   Vicci Barnie NOVAK, MD  potassium chloride  SA (KLOR-CON  M) 20 MEQ tablet Take 2 tablets (40 mEq total) by mouth daily. 12/18/22   Vicci Barnie NOVAK, MD  SUMAtriptan  (IMITREX ) 100 MG tablet Take 1 tablet at the start of the headache. 12/18/22   Vicci Barnie NOVAK, MD  topiramate  (TOPAMAX ) 25 MG tablet Take 1 tablet (25 mg total) by mouth at bedtime. To decrease migraine frequency 12/18/22   Vicci Barnie NOVAK, MD  Vitamin D , Ergocalciferol , (DRISDOL ) 1.25 MG (50000 UNIT) CAPS capsule Take 1 capsule (50,000 Units total) by mouth every  7 (seven) days. 12/20/22   Vicci Barnie NOVAK, MD      Allergies    Asa [aspirin], Mushroom extract complex (do not select), Penicillins, Shellfish allergy, Triamcinolone , and Zoloft  [sertraline ]    Review of Systems   Review of Systems  Physical Exam Updated Vital Signs BP (!) 129/92 (BP Location: Right Arm)   Pulse 77   Temp 97.8 F (36.6 C) (Oral)   Resp 14   Ht 1.651 m (5' 5)   Wt 98.9 kg   LMP 11/27/2012   SpO2 99%   BMI 36.28 kg/m  Physical Exam Vitals and nursing note reviewed.  Constitutional:      General: She is not in acute distress.     Appearance: She is well-developed.  HENT:     Head: Normocephalic and atraumatic.     Right Ear: External ear normal.     Left Ear: External ear normal.  Eyes:     General: No scleral icterus.       Right eye: No discharge.        Left eye: No discharge.     Conjunctiva/sclera: Conjunctivae normal.  Neck:     Trachea: No tracheal deviation.  Cardiovascular:     Rate and Rhythm: Normal rate and regular rhythm.  Pulmonary:     Effort: Pulmonary effort is normal. No respiratory distress.     Breath sounds: Normal breath sounds. No stridor. No wheezing or rales.  Abdominal:     General: Bowel sounds are normal. There is no distension.     Palpations: Abdomen is soft.     Tenderness: There is no abdominal tenderness. There is no guarding or rebound.  Musculoskeletal:        General: Tenderness present. No deformity.     Cervical back: Neck supple. Tenderness present.     Thoracic back: No tenderness.     Lumbar back: No tenderness.     Comments: Right ankle, mild swelling lateral maleolus  Skin:    General: Skin is warm and dry.     Findings: No rash.  Neurological:     General: No focal deficit present.     Mental Status: She is alert.     Cranial Nerves: No cranial nerve deficit, dysarthria or facial asymmetry.     Sensory: No sensory deficit.     Motor: No abnormal muscle tone or seizure activity.     Coordination: Coordination normal.  Psychiatric:        Mood and Affect: Mood normal.     ED Results / Procedures / Treatments   Labs (all labs ordered are listed, but only abnormal results are displayed) Labs Reviewed  COMPREHENSIVE METABOLIC PANEL - Abnormal; Notable for the following components:      Result Value   Glucose, Bld 127 (*)    Calcium  8.4 (*)    AST 63 (*)    ALT 58 (*)    Total Bilirubin 1.8 (*)    All other components within normal limits  CBC WITH DIFFERENTIAL/PLATELET - Abnormal; Notable for the following components:   Hemoglobin 15.1 (*)     Platelets 417 (*)    Neutro Abs 1.6 (*)    All other components within normal limits  CBG MONITORING, ED - Abnormal; Notable for the following components:   Glucose-Capillary 129 (*)    All other components within normal limits  POC OCCULT BLOOD, ED    EKG None EKG shows normal sinus rhythm with a rate of 86, borderline  left axis deviation, no ST elevation or depression, EKG similar to previous EKG dated 14 September 2022 Radiology CT ABDOMEN PELVIS W CONTRAST Result Date: 02/27/2023 CLINICAL DATA:  Abdominal pain EXAM: CT ABDOMEN AND PELVIS WITH CONTRAST TECHNIQUE: Multidetector CT imaging of the abdomen and pelvis was performed using the standard protocol following bolus administration of intravenous contrast. RADIATION DOSE REDUCTION: This exam was performed according to the departmental dose-optimization program which includes automated exposure control, adjustment of the mA and/or kV according to patient size and/or use of iterative reconstruction technique. CONTRAST:  75mL OMNIPAQUE  IOHEXOL  350 MG/ML SOLN COMPARISON:  None Available. FINDINGS: Lower chest: Lung bases are clear. Hepatobiliary: Liver is within normal limits. Status post cholecystectomy. No intrahepatic or extrahepatic duct dilatation. Pancreas: Within normal limits. Spleen: Within normal limits. Adrenals/Urinary Tract: Adrenal glands are within normal limits. Kidneys are within normal limits.  No hydronephrosis. Bladder is within normal limits. 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 No evidence of bowel obstruction. Normal appendix (series 6/image 65). No colonic wall thickening or inflammatory changes. Vascular/Lymphatic: No evidence of abdominal aortic aneurysm. Atherosclerotic calcifications of the abdominal aorta and branch vessels, although vessels remain patent. No suspicious abdominopelvic lymphadenopathy. Reproductive: Status post hysterectomy. Bilateral ovaries are within  normal limits. Other: No abdominopelvic ascites. Musculoskeletal: Visualized osseous structures are within normal limits. IMPRESSION: No CT findings to account for the patient's abdominal pain. Postsurgical changes, as described above. Electronically Signed   By: Pinkie Pebbles M.D.   On: 02/27/2023 21:52   CT Head Wo Contrast Result Date: 02/27/2023 CLINICAL DATA:  Syncope EXAM: CT HEAD WITHOUT CONTRAST CT CERVICAL SPINE WITHOUT CONTRAST TECHNIQUE: Multidetector CT imaging of the head and cervical spine was performed following the standard protocol without intravenous contrast. Multiplanar CT image reconstructions of the cervical spine were also generated. RADIATION DOSE REDUCTION: This exam was performed according to the departmental dose-optimization program which includes automated exposure control, adjustment of the mA and/or kV according to patient size and/or use of iterative reconstruction technique. COMPARISON:  CT head dated 03/02/2020. MRI cervical spine dated 12/11/2019. FINDINGS: CT HEAD FINDINGS Brain: No evidence of acute infarction, hemorrhage, hydrocephalus, extra-axial collection or mass lesion/mass effect. Vascular: No hyperdense vessel or unexpected calcification. Skull: Normal. Negative for fracture or focal lesion. Sinuses/Orbits: Visualized soft tissues are within normal limits. Other: None. CT CERVICAL SPINE FINDINGS Alignment: Normal cervical lordosis. Skull base and vertebrae: No acute fracture. No primary bone lesion or focal pathologic process. Soft tissues and spinal canal: No prevertebral fluid or swelling. No visible canal hematoma. Disc levels: Mild degenerative changes at C4-5 and C6-7. Spinal canal is patent. Upper chest: Visualized lung apices are clear. Other: Visualized thyroid  is unremarkable. IMPRESSION: Normal head CT. No traumatic injury to the cervical spine. Mild degenerative changes. Electronically Signed   By: Pinkie Pebbles M.D.   On: 02/27/2023 21:49   CT  Cervical Spine Wo Contrast Result Date: 02/27/2023 CLINICAL DATA:  Syncope EXAM: CT HEAD WITHOUT CONTRAST CT CERVICAL SPINE WITHOUT CONTRAST TECHNIQUE: Multidetector CT imaging of the head and cervical spine was performed following the standard protocol without intravenous contrast. Multiplanar CT image reconstructions of the cervical spine were also generated. RADIATION DOSE REDUCTION: This exam was performed according to the departmental dose-optimization program which includes automated exposure control, adjustment of the mA and/or kV according to patient size and/or use of iterative reconstruction technique. COMPARISON:  CT head dated 03/02/2020. MRI cervical spine dated 12/11/2019. FINDINGS: CT HEAD FINDINGS Brain: No evidence  of acute infarction, hemorrhage, hydrocephalus, extra-axial collection or mass lesion/mass effect. Vascular: No hyperdense vessel or unexpected calcification. Skull: Normal. Negative for fracture or focal lesion. Sinuses/Orbits: Visualized soft tissues are within normal limits. Other: None. CT CERVICAL SPINE FINDINGS Alignment: Normal cervical lordosis. Skull base and vertebrae: No acute fracture. No primary bone lesion or focal pathologic process. Soft tissues and spinal canal: No prevertebral fluid or swelling. No visible canal hematoma. Disc levels: Mild degenerative changes at C4-5 and C6-7. Spinal canal is patent. Upper chest: Visualized lung apices are clear. Other: Visualized thyroid  is unremarkable. IMPRESSION: Normal head CT. No traumatic injury to the cervical spine. Mild degenerative changes. Electronically Signed   By: Pinkie Pebbles M.D.   On: 02/27/2023 21:49   DG Ankle Complete Right Result Date: 02/27/2023 CLINICAL DATA:  Fall with ankle pain EXAM: RIGHT ANKLE - COMPLETE 3+ VIEW COMPARISON:  None Available. FINDINGS: Ossicle or remote injury adjacent to the medial malleolar tip. No acute fracture or dislocation. Mortise is symmetric. Small plantar calcaneal spur  IMPRESSION: No acute osseous abnormality. Electronically Signed   By: Luke Bun M.D.   On: 02/27/2023 21:00   DG Chest 2 View Result Date: 02/27/2023 CLINICAL DATA:  Fall EXAM: CHEST - 2 VIEW COMPARISON:  09/16/2022 FINDINGS: The heart size and mediastinal contours are within normal limits. Both lungs are clear. The visualized skeletal structures are unremarkable. IMPRESSION: No active cardiopulmonary disease. Electronically Signed   By: Luke Bun M.D.   On: 02/27/2023 20:59    Procedures Procedures    Medications Ordered in ED Medications  pantoprazole  (PROTONIX ) EC tablet 40 mg (has no administration in time range)  HYDROcodone -acetaminophen  (NORCO/VICODIN) 5-325 MG per tablet 1 tablet (1 tablet Oral Given 02/27/23 2010)  iohexol  (OMNIPAQUE ) 350 MG/ML injection 75 mL (75 mLs Intravenous Contrast Given 02/27/23 2143)    ED Course/ Medical Decision Making/ A&P Clinical Course as of 02/27/23 2223  Fri Feb 27, 2023  2006 Pt is refusing imaging until she receives pain medications [JK]  2137 Ankle x-ray without acute findings. [JK]  2137 Chest x-ray without acute finding [JK]  2157 Patient's abdominal pelvic CT scan does not show any acute finding [JK]  2158 Head and C-spine CT does not show any acute finding [JK]    Clinical Course User Index [JK] Randol Simmonds, MD                                 Medical Decision Making Problems Addressed: Abdominal pain, unspecified abdominal location: chronic illness or injury with exacerbation, progression, or side effects of treatment Sprain of right ankle, unspecified ligament, initial encounter: acute illness or injury that poses a threat to life or bodily functions Syncope, unspecified syncope type: acute illness or injury that poses a threat to life or bodily functions  Amount and/or Complexity of Data Reviewed Labs: ordered. Decision-making details documented in ED Course. Radiology: ordered and independent interpretation  performed.  Risk Prescription drug management.   Patient presented to the ED with complaints of abdominal pain and syncope.  Patient states she thinks she is having a GI bleed.  Patient states she has noticed dark stools.  Patient had a colonoscopy last month after being admitted for rectal bleeding.  No mass or abnormalities were noted at that time.  Patient presented today with complaints of abdominal pain and a syncopal episode.  Patient was primarily complaining of neck pain and headache and ankle pain.  While she was in the ED she cervical also complaining of abdominal pain.  Patient's ED workup is reassuring.  She does not have any evidence of intestinal bleeding on exam.  Her Hemoccult is negative.  Her CBC is normal.  Her electrolyte panel is unremarkable.  No cardiac dysrhythmia noted.  Patient did have CT scans of her head neck abdomen pelvis.  No signs of serious injury and no signs of colitis diverticulitis or other acute abdominal process.  Etiology of her abdominal discomfort is unclear but patient is stable for outpatient follow-up.  She was also complaining of ankle pain.  X-rays do not show any signs of fracture.  Will provide Ace wrap and crutches for support.  Evaluation and diagnostic testing in the emergency department does not suggest an emergent condition requiring admission or immediate intervention beyond what has been performed at this time.  The patient is safe for discharge and has been instructed to return immediately for worsening symptoms, change in symptoms or any other concerns.        Final Clinical Impression(s) / ED Diagnoses Final diagnoses:  Syncope, unspecified syncope type  Abdominal pain, unspecified abdominal location  Sprain of right ankle, unspecified ligament, initial encounter    Rx / DC Orders ED Discharge Orders     None         Randol Simmonds, MD 02/27/23 2223

## 2023-02-27 NOTE — Discharge Instructions (Signed)
 Over-the-counter medications as needed for pain.  Apply ice to help with the swelling of your ankle.  Use the crutches and the Ace wrap to support your ankle.  Can put weight on it as tolerated.  The CT scan and laboratory tests are reassuring.  There is no signs of any internal bleeding or acute infection in your abdomen.  Follow-up with your primary care doctor and GI doctors to be rechecked

## 2023-02-27 NOTE — Progress Notes (Signed)
 Orthopedic Tech Progress Note Patient Details:  Marisa [name] Dec 22, 1975 [id number] Lace up ankle brace applied to RLE . Crutches and crutch training provided.   Ortho Devices Type of Ortho Device: ASO Ortho Device/Splint Location: RLE Ortho Device/Splint Interventions: Ordered, Application, Adjustment   Post Interventions Patient Tolerated: Well Instructions Provided: Adjustment of device, Care of device  [name] December 02/27/2023, 10:51 PM

## 2023-02-27 NOTE — ED Notes (Signed)
 Pt given pain medicine. Imaging notified that pt is ready for scan

## 2023-02-27 NOTE — ED Notes (Signed)
 Pt back from CT

## 2023-02-27 NOTE — ED Notes (Signed)
 Pt gone to CT

## 2023-02-27 NOTE — ED Notes (Signed)
 Pt not given IV protonix due to discontinuation. This RN already prepared medication before discontinuation. IV protonix was wasted

## 2023-03-02 ENCOUNTER — Encounter: Payer: Self-pay | Admitting: Podiatry

## 2023-03-02 ENCOUNTER — Ambulatory Visit (INDEPENDENT_AMBULATORY_CARE_PROVIDER_SITE_OTHER): Payer: Medicare HMO

## 2023-03-02 ENCOUNTER — Ambulatory Visit (INDEPENDENT_AMBULATORY_CARE_PROVIDER_SITE_OTHER): Payer: Medicare HMO | Admitting: Podiatry

## 2023-03-02 ENCOUNTER — Other Ambulatory Visit: Payer: Self-pay | Admitting: Internal Medicine

## 2023-03-02 DIAGNOSIS — M7751 Other enthesopathy of right foot: Secondary | ICD-10-CM

## 2023-03-02 DIAGNOSIS — S93401A Sprain of unspecified ligament of right ankle, initial encounter: Secondary | ICD-10-CM

## 2023-03-02 DIAGNOSIS — M778 Other enthesopathies, not elsewhere classified: Secondary | ICD-10-CM | POA: Diagnosis not present

## 2023-03-02 DIAGNOSIS — G43709 Chronic migraine without aura, not intractable, without status migrainosus: Secondary | ICD-10-CM

## 2023-03-02 DIAGNOSIS — R609 Edema, unspecified: Secondary | ICD-10-CM | POA: Diagnosis not present

## 2023-03-02 DIAGNOSIS — M7752 Other enthesopathy of left foot: Secondary | ICD-10-CM

## 2023-03-02 MED ORDER — HYDROCODONE-ACETAMINOPHEN 5-325 MG PO TABS: 1.0000 | ORAL_TABLET | Freq: Four times a day (QID) | ORAL | 0 refills | Status: DC | PRN

## 2023-03-02 NOTE — Progress Notes (Signed)
 Subjective:   Patient ID: Marisa [name], female   DOB: 48 y.o.   MRN: [id number]   HPI Chief Complaint  Patient presents with   Foot Pain    RM#11 Right foot pain due to injury about 1 month ago and injured it again two days ago. States knees hurting as well seen at emergency room given ankle brace and crutches.    48 year old female presents the office with above concerns.  She states that she fell injuring her right foot about a month ago.  She was in [place] at the time and she denies any emergency room.  She is ago she said that she lost her balance and she twisted her right side.  She was seen at the emergency room at that time.  She states that she is having quite a bit of pain to her ankle.  She said that she has some discomfort in left side but not nearly as significant as the right side.  Review of Systems  All other systems reviewed and are negative.  Past Medical History:  Diagnosis Date   Abdominal pain 04/26/2013   Abnormal uterine bleeding (AUB) 10/11/2012   Acute bronchitis    Allergy    Anal pain    chronic   Anemia    Anxiety    Asthma    exacerbation 02-28-2014 and 02-23-2014 secondary to Rhinovirus   Atypical chest pain 04/26/2013   Benign neoplasm of sigmoid colon    Benign neoplasm of transverse colon    Carbuncle of labium 07/12/2015   Chest pain 02/20/2014   Chronic diarrhea    Chronic headaches    Chronic low back pain    Cigarette nicotine dependence without complication 05/04/2014   Cyst of right ovary    Dandruff 03/26/2015   Diabetes mellitus without complication (HCC) 10/13/2013   Diabetic neuropathy, painful (HCC) 12/13/2019   Difficult intravenous access    PER PT NEEDS PICC LINE   Dyspnea 09/07/2012   Spiro 08/2012:  No obstruction by FEV1%, but probable restriction.     Falls 03/27/2014   Food allergy    Mushrooms, shellfish   GAD (generalized anxiety disorder) 05/04/2014   Gait disturbance 04/11/2014   Gait instability    GERD  (gastroesophageal reflux disease)    History of adenomatous polyp of colon    History of cardiac arrest    during SVD 1992   History of ectopic pregnancy    2009-  S/P LEFT SALPINGECTOMY   History of panic attacks    Hyperlipidemia    IBS (irritable bowel syndrome)    Insomnia 03/06/2014   Joint pain    Lower extremity edema    Lumbar stenosis L4 -- L5 with bulging disk   w/ right leg weakness/ decreased mobility   Migraine variant with headache 05/09/2014   Mild obstructive sleep apnea    study 03-20-2014  no cpap recommended   Nausea and vomiting 09/16/2022   Neuromuscular disorder (HCC)    neuropathy in feet    Neuropathic pain of both legs 06/04/2016   Obesity (BMI 30-39.9) 03/05/2018   OSA (obstructive sleep apnea) 03/06/2014   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   Panniculitis 03/05/2018   Pelvic pain 07/25/2013   Persistent vomiting 04/27/2013   Pneumonia    PTSD (post-traumatic stress disorder) 05/04/2014   Rash and nonspecific skin eruption 07/12/2015   Rectal bleeding 07/25/2013   RLQ abdominal pain    S/P Total vaginal hysterectomy on 01/06/13 01/06/2013   Sleep  apnea    mild no cpap   Social anxiety disorder 05/04/2014   Sore throat 02/20/2014   Stomach ulcer    Tachycardia 02/20/2014   Type 2 diabetes mellitus (HCC)    Weakness of right leg    FROM BACK PROBLEM PER PT    Past Surgical History:  Procedure Laterality Date   ABDOMINAL HYSTERECTOMY     partial   BIOPSY  01/22/2023   Procedure: BIOPSY;  Surgeon: Legrand Victory LITTIE DOUGLAS, MD;  Location: [facility];  Service: Gastroenterology;;   COLONOSCOPY Left 04/29/2013   Procedure: COLONOSCOPY;  Surgeon: Elsie Cree, MD;  Location: [facility];  Service: Endoscopy;  Laterality: Left;   COLONOSCOPY     COLONOSCOPY WITH PROPOFOL  N/A 08/01/2016   Procedure: COLONOSCOPY WITH PROPOFOL ;  Surgeon: Legrand Victory LITTIE DOUGLAS, MD;  Location: [facility];  Service: Gastroenterology;  Laterality: N/A;    COLONOSCOPY WITH PROPOFOL  N/A 01/22/2023   Procedure: COLONOSCOPY WITH PROPOFOL ;  Surgeon: Legrand Victory LITTIE DOUGLAS, MD;  Location: [facility];  Service: Gastroenterology;  Laterality: N/A;   ECTOPIC PREGNANCY SURGERY     ESOPHAGOGASTRODUODENOSCOPY (EGD) WITH PROPOFOL  N/A 11/10/2018   Procedure: ESOPHAGOGASTRODUODENOSCOPY (EGD) WITH PROPOFOL ;  Surgeon: Legrand Victory LITTIE DOUGLAS, MD;  Location: [facility];  Service: Gastroenterology;  Laterality: N/A;   EVALUATION UNDER ANESTHESIA WITH FISTULECTOMY N/A 04/20/2014   Procedure: EXAM UNDER ANESTHESIA ;  Surgeon: Bernarda Ned, MD;  Location: [facility];  Service: General;  Laterality: N/A;   FLEXIBLE SIGMOIDOSCOPY N/A 11/09/2013   Procedure: FLEXIBLE SIGMOIDOSCOPY;  Surgeon: Elsie Cree, MD;  Location: [facility];  Service: Endoscopy;  Laterality: N/A;   fupa removal      LAPAROSCOPIC CHOLECYSTECTOMY  2005   LAPAROSCOPIC GASTRIC SLEEVE RESECTION N/A 12/24/2020   Procedure: LAPAROSCOPIC GASTRIC SLEEVE RESECTION;  Surgeon: Signe Mitzie LABOR, MD;  Location: [facility];  Service: General;  Laterality: N/A;   REFRACTIVE SURGERY     SPHINCTEROTOMY N/A 04/20/2014   Procedure:  LATERAL INTERNAL SPHINCTEROTOMY;  Surgeon: Bernarda Ned, MD;  Location: [facility];  Service: General;  Laterality: N/A;   TRANSTHORACIC ECHOCARDIOGRAM  12/30/2012   mild LVH/  ef 55-60%   UNILATERAL SALPINGECTOMY  2009   laparotomy left salpingectomy-- ectopic preg.   UPPER GASTROINTESTINAL ENDOSCOPY     UPPER GI ENDOSCOPY N/A 12/24/2020   Procedure: UPPER GI ENDOSCOPY;  Surgeon: Signe Mitzie LABOR, MD;  Location: [facility];  Service: General;  Laterality: N/A;   VAGINAL HYSTERECTOMY N/A 01/06/2013   Procedure: HYSTERECTOMY VAGINAL;  Surgeon: Gloris LABOR Hugger, MD;  Location: [facility];  Service: Gynecology;  Laterality: N/A;     Current Outpatient Medications:    Accu-Chek Softclix Lancets lancets, Use as instructed, Disp: 100 each, Rfl: 12   albuterol   (PROVENTIL  HFA) 108 (90 Base) MCG/ACT inhaler, Inhale 2 puffs into the lungs every 6 (six) hours as needed for wheezing or shortness of breath., Disp: 6.7 g, Rfl: 2   albuterol  (PROVENTIL ) (2.5 MG/3ML) 0.083% nebulizer solution, Take 3 mLs (2.5 mg total) by nebulization every 6 (six) hours as needed for wheezing or shortness of breath., Disp: 150 mL, Rfl: 1   atorvastatin  (LIPITOR) 10 MG tablet, Take 1 tablet (10 mg total) by mouth daily., Disp: 90 tablet, Rfl: 1   Blood Glucose Monitoring Suppl (ACCU-CHEK GUIDE) w/Device KIT, Check blood sugars daily, Disp: 1 kit, Rfl: 0   dicyclomine  (BENTYL ) 10 MG capsule, Take 1 capsule (10 mg total) by mouth 3 (three) times daily before meals., Disp: 90  capsule, Rfl: 1   DULoxetine  (CYMBALTA ) 20 MG capsule, Take 1 capsule (20 mg total) by mouth daily., Disp: 30 capsule, Rfl: 3   EPINEPHrine  0.3 mg/0.3 mL IJ SOAJ injection, Inject 0.3 mg into the muscle as needed for anaphylaxis., Disp: 1 each, Rfl: 1   gabapentin  (NEURONTIN ) 300 MG capsule, Take 1 capsule (300 mg total) by mouth 2 (two) times daily., Disp: 180 capsule, Rfl: 1   glucose blood (ACCU-CHEK GUIDE) test strip, Use as instructed, Disp: 100 each, Rfl: 12   HYDROcodone -acetaminophen  (NORCO/VICODIN) 5-325 MG tablet, Take 1 tablet by mouth every 6 (six) hours as needed., Disp: 15 tablet, Rfl: 0   hydrOXYzine  (VISTARIL ) 25 MG capsule, Take 1 capsule (25 mg total) by mouth at bedtime as needed for anxiety., Disp: 30 capsule, Rfl: 3   Lancets Misc. (ACCU-CHEK SOFTCLIX LANCET DEV) KIT, Use to check blood sugar, Disp: 1 kit, Rfl: 0   Multiple Vitamins-Minerals (BARIATRIC MULTIVITAMINS/IRON PO), Take 1 tablet by mouth daily., Disp: , Rfl:    ondansetron  (ZOFRAN ) 4 MG tablet, Take 1 tablet (4 mg total) by mouth every 6 (six) hours as needed for nausea., Disp: 20 tablet, Rfl: 0   potassium chloride  SA (KLOR-CON  M) 20 MEQ tablet, Take 2 tablets (40 mEq total) by mouth daily., Disp: 60 tablet, Rfl: 1   SUMAtriptan   (IMITREX ) 100 MG tablet, Take 1 tablet at the start of the headache., Disp: 12 tablet, Rfl: 3   Vitamin D , Ergocalciferol , (DRISDOL ) 1.25 MG (50000 UNIT) CAPS capsule, Take 1 capsule (50,000 Units total) by mouth every 7 (seven) days., Disp: 12 capsule, Rfl: 1   topiramate  (TOPAMAX ) 25 MG tablet, TAKE 1 TABLET BY MOUTH AT BEDTIME TO  DECREASE  MIGRAINE  FREQUENCY, Disp: 90 tablet, Rfl: 0  Allergies  Allergen Reactions   Asa [Aspirin] Anaphylaxis and Hives    Hives, chest tightness    Mushroom Extract Complex (Do Not Select) Anaphylaxis, Swelling and Other (See Comments)    Reaction:  Eye swelling   Penicillins Anaphylaxis    Anaphylaxis ~48 yo necessitating ED visit (took pink thick liquid) - cannot recall taking any cephalosporins   Shellfish Allergy Anaphylaxis   Triamcinolone  Other (See Comments)    Skin issues    Zoloft  [Sertraline ]     Jittery feeling          Objective:  Physical Exam  General: AAO x3, NAD  Dermatological: Skin is warm, dry and supple bilateral. There are no open sores, no preulcerative lesions, no rash or signs of infection present.  Vascular: Dorsalis Pedis artery and Posterior Tibial artery pedal pulses are 2/4 bilateral with immedate capillary fill time. There is no pain with calf compression, swelling, warmth, erythema.   Neruologic: Grossly intact via light touch bilateral.   Musculoskeletal: There is global, diffuse tenderness present of the right ankle but there is localized edema mostly to the anterolateral aspect the ankle joint.  It seems most of her tenderness is along the lateral ankle complex, ATFL, CFL.  Range of motion decreased given pain, guarding on the right side.  No significant pain of the forefoot.  There is mild discomfort noted along the lateral ankle complex and left side there is no area pinpoint tenderness.  Gait: Unassisted, Nonantalgic.        Assessment:   Ankle sprain, injury right side >> left     Plan:  -Treatment  options discussed including all alternatives, risks, and complications -Etiology of symptoms were discussed -I reviewed the x-rays of the  right ankle from emergency room and new x-rays were obtained and reviewed of bilateral feet.  There is no evidence of acute fracture noted today. -Continue immobilization in the cam boot. -Ice, elevation.  She has been nonweightbearing but she starts to improve she can weight-bear as tolerated. -Short course of Vicodin prn -Ice/elevation -Discussed if symptoms not proved recommend MRI.  Return in about 2 weeks (around 03/16/2023) for injury, repeat x-rays.  Donnice JONELLE Fees DPM

## 2023-03-17 ENCOUNTER — Ambulatory Visit: Payer: Medicare HMO | Admitting: Podiatry

## 2023-04-10 ENCOUNTER — Ambulatory Visit: Payer: 59 | Admitting: Physician Assistant

## 2023-04-20 ENCOUNTER — Ambulatory Visit: Payer: Self-pay | Admitting: Internal Medicine

## 2023-04-28 ENCOUNTER — Ambulatory Visit: Payer: Self-pay

## 2023-04-28 NOTE — Telephone Encounter (Signed)
 Noted.

## 2023-04-28 NOTE — Telephone Encounter (Signed)
 Chief Complaint: Hand numbness Symptoms: left hand numbness, tingling, weakness Frequency: Comes and goes  Pertinent Negatives: Patient denies injury Disposition: [] ED /[] Urgent Care (no appt availability in office) / [x] Appointment(In office/virtual)/ []  [facility]/ [] Home Care/ [] Refused Recommended Disposition /[] [facility]/ []  Follow-up with PCP Additional Notes: Patient states she was diagnosed with neuropathy in the right hand but now she is having left hand numbness, tingling, and weakness. Patient states she missed her neurology appointment and has not been able to get it rescheduled yet. Care advice was given and patient has been scheduled at [facility]. Patient was a previous patient of Dr. Delford Field.   Copied from CRM [id number]. Topic: Clinical - Red Word Triage >> Apr 28, 2023 11:05 AM Marisa [name] wrote: Red Word that prompted transfer to Nurse Triage: numbness in left hand Reason for Disposition  Numbness (i.e., loss of sensation) in hand or fingers  (Exception: Just tingling; numbness present > 2 weeks.)  Answer Assessment - Initial Assessment Questions 1. ONSET: "When did the pain start?"     1 week  2. LOCATION: "Where is the pain located?"     Left hand  3. PAIN: "How bad is the pain?" (Scale 1-10; or mild, moderate, severe)   - MILD (1-3): doesn't interfere with normal activities   - MODERATE (4-7): interferes with normal activities (e.g., work or school) or awakens from sleep   - SEVERE (8-10): excruciating pain, unable to use hand at all     Mild  4. WORK OR EXERCISE: "Has there been any recent work or exercise that involved this part (i.e., hand or wrist) of the body?"     No  5. CAUSE: "What do you think is causing the pain?"     Neuropathy in the hands  6. AGGRAVATING FACTORS: "What makes the pain worse?" (e.g., using computer)     Stress  7. OTHER SYMPTOMS: "Do you have any other symptoms?" (e.g., neck pain, swelling, rash, numbness, fever)      Numbness and tingling, weakness, tightness  Protocols used: Hand and Wrist Pain-A-AH

## 2023-05-08 ENCOUNTER — Encounter: Payer: Self-pay | Admitting: Nurse Practitioner

## 2023-05-08 ENCOUNTER — Ambulatory Visit: Payer: Self-pay | Attending: Nurse Practitioner | Admitting: Nurse Practitioner

## 2023-05-08 VITALS — BP 108/75 | HR 78 | Resp 19 | Ht 65.0 in | Wt 219.0 lb

## 2023-05-08 DIAGNOSIS — F419 Anxiety disorder, unspecified: Secondary | ICD-10-CM

## 2023-05-08 DIAGNOSIS — M79641 Pain in right hand: Secondary | ICD-10-CM

## 2023-05-08 DIAGNOSIS — M5412 Radiculopathy, cervical region: Secondary | ICD-10-CM

## 2023-05-08 DIAGNOSIS — M79642 Pain in left hand: Secondary | ICD-10-CM

## 2023-05-08 DIAGNOSIS — J45909 Unspecified asthma, uncomplicated: Secondary | ICD-10-CM

## 2023-05-08 DIAGNOSIS — F32A Depression, unspecified: Secondary | ICD-10-CM

## 2023-05-08 DIAGNOSIS — E1140 Type 2 diabetes mellitus with diabetic neuropathy, unspecified: Secondary | ICD-10-CM

## 2023-05-08 MED ORDER — BUSPIRONE HCL 15 MG PO TABS: 15.0000 mg | ORAL_TABLET | Freq: Two times a day (BID) | ORAL | 3 refills | Status: DC

## 2023-05-08 MED ORDER — ALBUTEROL SULFATE HFA 108 (90 BASE) MCG/ACT IN AERS: 2.0000 | INHALATION_SPRAY | Freq: Four times a day (QID) | RESPIRATORY_TRACT | 2 refills | Status: DC | PRN

## 2023-05-08 MED ORDER — DULOXETINE HCL 40 MG PO CPEP: 40.0000 mg | ORAL_CAPSULE | Freq: Every day | ORAL | 1 refills | Status: AC

## 2023-05-08 MED ORDER — ACCU-CHEK GUIDE TEST VI STRP: ORAL_STRIP | 12 refills | Status: DC

## 2023-05-08 NOTE — Progress Notes (Signed)
 Assessment & Plan:  Marisa [name] was seen today for hand problem.  Diagnoses and all orders for this visit:  Cervical radiculopathy -     Ambulatory referral to Neurology  Bilateral hand pain -     Ambulatory referral to Hand Surgery  Anxiety and depression -     DULoxetine 40 MG CPEP; Take 1 capsule (40 mg total) by mouth daily. -     busPIRone (BUSPAR) 15 MG tablet; Take 1 tablet (15 mg total) by mouth 2 (two) times daily.  Intrinsic asthma -     albuterol (PROVENTIL HFA) 108 (90 Base) MCG/ACT inhaler; Inhale 2 puffs into the lungs every 6 (six) hours as needed for wheezing or shortness of breath.  Type 2 diabetes mellitus with diabetic neuropathy, without long-term current use of insulin (HCC) -     glucose blood (ACCU-CHEK GUIDE TEST) test strip; Use as instructed    Patient has been counseled on age-appropriate routine health concerns for screening and prevention. These are reviewed and up-to-date. Referrals have been placed accordingly. Immunizations are up-to-date or declined.    Subjective:   Chief Complaint  Patient presents with   Hand Problem    Marisa [name] 48 y.o. female presents to office today with left hand pain. She is a patient of Dr. Delford Field   Patient with history of DM type II, HL, multinodular thyroid, MDD/GAD/PTSD, obesity, migraine headaches, OSA (mild)not on CPAP, intrinsic asthma,   Left hand pain Marisa [name] states she started experiencing left hand pain 1.5 weeks ago.  Pain is described as numbness and tingling. She has a history of right hand pain and carpal tunnel was ruled out by neurology who performed a NCS/EMG 05-07-2021.  It was suggested at that time if NCS was negative then an MRI would be ordered. Marisa. Aurther [name] did not follow up thereafter and states she had moved out of town temporarily at that time. MRI was suggested due to patient having a history of cervical radiculopathy. She states she has difficulty grasping and holding on to  objects. CT CERVICAL SPINE 02-27-2023 Mild degenerative changes    Notes Multiple falls (3) within the last week. Soreness. Fell twice in the tub. The 3rd time her legs wobbly and then she fell to the floor of her bedroom. She states she has already had physical therapy and was told her balance issues were related to a spinal curvature/malformation. She is not using a cane today. However gait is unsteady today.   She states hydroxyzine has been ineffective. Would like to try something else. Feels her anxiety is not controlled.    Review of Systems  Constitutional:  Negative for fever, malaise/fatigue and weight loss.  HENT: Negative.  Negative for nosebleeds.   Eyes: Negative.  Negative for blurred vision, double vision and photophobia.  Respiratory: Negative.  Negative for cough and shortness of breath.   Cardiovascular: Negative.  Negative for chest pain, palpitations and leg swelling.  Gastrointestinal: Negative.  Negative for heartburn, nausea and vomiting.  Musculoskeletal:  Positive for joint pain and neck pain. Negative for myalgias.  Neurological:  Positive for tingling, sensory change and weakness. Negative for dizziness, focal weakness, seizures and headaches.  Psychiatric/Behavioral: Negative.  Negative for suicidal ideas.     Past Medical History:  Diagnosis Date   Abdominal pain 04/26/2013   Abnormal uterine bleeding (AUB) 10/11/2012   Acute bronchitis    Allergy    Anal pain    chronic   Anemia    Anxiety  Asthma    exacerbation 02-28-2014 and 02-23-2014 secondary to Rhinovirus   Atypical chest pain 04/26/2013   Benign neoplasm of sigmoid colon    Benign neoplasm of transverse colon    Carbuncle of labium 07/12/2015   Chest pain 02/20/2014   Chronic diarrhea    Chronic headaches    Chronic low back pain    Cigarette nicotine dependence without complication 05/04/2014   Cyst of right ovary    Dandruff 03/26/2015   Diabetes mellitus without complication (HCC)  10/13/2013   Diabetic neuropathy, painful (HCC) 12/13/2019   Difficult intravenous access    PER PT NEEDS PICC LINE   Dyspnea 09/07/2012   Cleda Daub 08/2012:  No obstruction by FEV1%, but probable restriction.     Falls 03/27/2014   Food allergy    Mushrooms, shellfish   GAD (generalized anxiety disorder) 05/04/2014   Gait disturbance 04/11/2014   Gait instability    GERD (gastroesophageal reflux disease)    History of adenomatous polyp of colon    History of cardiac arrest    during SVD 1992   History of ectopic pregnancy    2009-  S/P LEFT SALPINGECTOMY   History of panic attacks    Hyperlipidemia    IBS (irritable bowel syndrome)    Insomnia 03/06/2014   Joint pain    Lower extremity edema    Lumbar stenosis L4 -- L5 with bulging disk   w/ right leg weakness/ decreased mobility   Migraine variant with headache 05/09/2014   Mild obstructive sleep apnea    study 03-20-2014  no cpap recommended   Nausea and vomiting 09/16/2022   Neuromuscular disorder (HCC)    neuropathy in feet    Neuropathic pain of both legs 06/04/2016   Obesity (BMI 30-39.9) 03/05/2018   OSA (obstructive sleep apnea) 03/06/2014   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   Panniculitis 03/05/2018   Pelvic pain 07/25/2013   Persistent vomiting 04/27/2013   Pneumonia    PTSD (post-traumatic stress disorder) 05/04/2014   Rash and nonspecific skin eruption 07/12/2015   Rectal bleeding 07/25/2013   RLQ abdominal pain    S/P Total vaginal hysterectomy on 01/06/13 01/06/2013   Sleep apnea    mild no cpap   Social anxiety disorder 05/04/2014   Sore throat 02/20/2014   Stomach ulcer    Tachycardia 02/20/2014   Type 2 diabetes mellitus (HCC)    Weakness of right leg    FROM BACK PROBLEM PER PT    Past Surgical History:  Procedure Laterality Date   ABDOMINAL HYSTERECTOMY     partial   BIOPSY  01/22/2023   Procedure: BIOPSY;  Surgeon: Sherrilyn Rist, MD;  Location: [facility];  Service:  Gastroenterology;;   COLONOSCOPY Left 04/29/2013   Procedure: COLONOSCOPY;  Surgeon: Willis Modena, MD;  Location: [facility];  Service: Endoscopy;  Laterality: Left;   COLONOSCOPY     COLONOSCOPY WITH PROPOFOL N/A 08/01/2016   Procedure: COLONOSCOPY WITH PROPOFOL;  Surgeon: Sherrilyn Rist, MD;  Location: [facility];  Service: Gastroenterology;  Laterality: N/A;   COLONOSCOPY WITH PROPOFOL N/A 01/22/2023   Procedure: COLONOSCOPY WITH PROPOFOL;  Surgeon: Sherrilyn Rist, MD;  Location: [facility];  Service: Gastroenterology;  Laterality: N/A;   ECTOPIC PREGNANCY SURGERY     ESOPHAGOGASTRODUODENOSCOPY (EGD) WITH PROPOFOL N/A 11/10/2018   Procedure: ESOPHAGOGASTRODUODENOSCOPY (EGD) WITH PROPOFOL;  Surgeon: Sherrilyn Rist, MD;  Location: [facility];  Service: Gastroenterology;  Laterality: N/A;   EVALUATION UNDER ANESTHESIA WITH  FISTULECTOMY N/A 04/20/2014   Procedure: EXAM UNDER ANESTHESIA ;  Surgeon: Romie Levee, MD;  Location: [facility];  Service: General;  Laterality: N/A;   FLEXIBLE SIGMOIDOSCOPY N/A 11/09/2013   Procedure: FLEXIBLE SIGMOIDOSCOPY;  Surgeon: Willis Modena, MD;  Location: [facility];  Service: Endoscopy;  Laterality: N/A;   fupa removal      LAPAROSCOPIC CHOLECYSTECTOMY  2005   LAPAROSCOPIC GASTRIC SLEEVE RESECTION N/A 12/24/2020   Procedure: LAPAROSCOPIC GASTRIC SLEEVE RESECTION;  Surgeon: Berna Bue, MD;  Location: [facility];  Service: General;  Laterality: N/A;   REFRACTIVE SURGERY     SPHINCTEROTOMY N/A 04/20/2014   Procedure:  LATERAL INTERNAL SPHINCTEROTOMY;  Surgeon: Romie Levee, MD;  Location: [facility];  Service: General;  Laterality: N/A;   TRANSTHORACIC ECHOCARDIOGRAM  12/30/2012   mild LVH/  ef 55-60%   UNILATERAL SALPINGECTOMY  2009   laparotomy left salpingectomy-- ectopic preg.   UPPER GASTROINTESTINAL ENDOSCOPY     UPPER GI ENDOSCOPY N/A 12/24/2020   Procedure: UPPER GI ENDOSCOPY;  Surgeon:  Berna Bue, MD;  Location: [facility];  Service: General;  Laterality: N/A;   VAGINAL HYSTERECTOMY N/A 01/06/2013   Procedure: HYSTERECTOMY VAGINAL;  Surgeon: Tereso Newcomer, MD;  Location: [facility];  Service: Gynecology;  Laterality: N/A;    Family History  Problem Relation Age of Onset   Hypertension Mother    Diabetes Mother    Allergies Mother    Heart disease Mother    Clotting disorder Mother    Stroke Mother    Kidney disease Mother    Thyroid disease Mother    Cancer Father    Hyperlipidemia Father    Hypertension Father    Colon cancer Father    Liver disease Father    Heart disease Maternal Grandmother    Breast cancer Maternal Grandmother 41   Schizophrenia Sister    Bipolar disorder Sister    Clotting disorder Sister    Bipolar disorder Brother    Kidney disease Brother    Bipolar disorder Sister    Pancreatic cancer Maternal Aunt    Prostate cancer Maternal Uncle    Liver cancer Maternal Grandfather    Rectal cancer Maternal Grandfather    Liver cancer Paternal Grandfather    Colon polyps Neg Hx    Esophageal cancer Neg Hx    Stomach cancer Neg Hx     Social History Reviewed with no changes to be made today.   Outpatient Medications Prior to Visit  Medication Sig Dispense Refill   Accu-Chek Softclix Lancets lancets Use as instructed 100 each 12   albuterol (PROVENTIL) (2.5 MG/3ML) 0.083% nebulizer solution Take 3 mLs (2.5 mg total) by nebulization every 6 (six) hours as needed for wheezing or shortness of breath. 150 mL 1   atorvastatin (LIPITOR) 10 MG tablet Take 1 tablet (10 mg total) by mouth daily. 90 tablet 1   Blood Glucose Monitoring Suppl (ACCU-CHEK GUIDE) w/Device KIT Check blood sugars daily 1 kit 0   dicyclomine (BENTYL) 10 MG capsule Take 1 capsule (10 mg total) by mouth 3 (three) times daily before meals. 90 capsule 1   EPINEPHrine 0.3 mg/0.3 mL IJ SOAJ injection Inject 0.3 mg into the muscle as needed for anaphylaxis. 1 each 1   gabapentin  (NEURONTIN) 300 MG capsule Take 1 capsule (300 mg total) by mouth 2 (two) times daily. 180 capsule 1   glucose blood (ACCU-CHEK GUIDE) test strip Use as instructed 100 each 12   Lancets  Misc. (ACCU-CHEK SOFTCLIX LANCET DEV) KIT Use to check blood sugar 1 kit 0   Multiple Vitamins-Minerals (BARIATRIC MULTIVITAMINS/IRON PO) Take 1 tablet by mouth daily.     ondansetron (ZOFRAN) 4 MG tablet Take 1 tablet (4 mg total) by mouth every 6 (six) hours as needed for nausea. 20 tablet 0   potassium chloride SA (KLOR-CON M) 20 MEQ tablet Take 2 tablets (40 mEq total) by mouth daily. 60 tablet 1   SUMAtriptan (IMITREX) 100 MG tablet Take 1 tablet at the start of the headache. 12 tablet 3   topiramate (TOPAMAX) 25 MG tablet TAKE 1 TABLET BY MOUTH AT BEDTIME TO  DECREASE  MIGRAINE  FREQUENCY 90 tablet 0   Vitamin D, Ergocalciferol, (DRISDOL) 1.25 MG (50000 UNIT) CAPS capsule Take 1 capsule (50,000 Units total) by mouth every 7 (seven) days. 12 capsule 1   albuterol (PROVENTIL HFA) 108 (90 Base) MCG/ACT inhaler Inhale 2 puffs into the lungs every 6 (six) hours as needed for wheezing or shortness of breath. 6.7 g 2   DULoxetine (CYMBALTA) 20 MG capsule Take 1 capsule (20 mg total) by mouth daily. 30 capsule 3   hydrOXYzine (VISTARIL) 25 MG capsule Take 1 capsule (25 mg total) by mouth at bedtime as needed for anxiety. 30 capsule 3   HYDROcodone-acetaminophen (NORCO/VICODIN) 5-325 MG tablet Take 1 tablet by mouth every 6 (six) hours as needed. (Patient not taking: Reported on 05/08/2023) 15 tablet 0   No facility-administered medications prior to visit.    Allergies  Allergen Reactions   Asa [Aspirin] Anaphylaxis and Hives    Hives, chest tightness    Mushroom Extract Complex (Obsolete) Anaphylaxis, Swelling and Other (See Comments)    Reaction:  Eye swelling   Penicillins Anaphylaxis    Anaphylaxis ~48 yo necessitating ED visit (took pink thick liquid) - cannot recall taking any cephalosporins   Shellfish  Allergy Anaphylaxis   Triamcinolone Other (See Comments)    Skin issues    Zoloft [Sertraline]     Jittery feeling       Objective:    BP 108/75 (BP Location: Left Arm, Patient Position: Sitting, Cuff Size: Normal)   Pulse 78   Resp 19   Ht 5\' 5"  (1.651 m)   Wt 219 lb (99.3 kg)   LMP 11/27/2012   SpO2 99%   BMI 36.44 kg/m  Wt Readings from Last 3 Encounters:  05/08/23 219 lb (99.3 kg)  02/27/23 218 lb (98.9 kg)  01/22/23 214 lb 15.2 oz (97.5 kg)    Physical Exam Vitals and nursing note reviewed.  Constitutional:      Appearance: She is well-developed.  HENT:     Head: Normocephalic and atraumatic.  Cardiovascular:     Rate and Rhythm: Normal rate and regular rhythm.     Heart sounds: Normal heart sounds. No murmur heard.    No friction rub. No gallop.  Pulmonary:     Effort: Pulmonary effort is normal. No tachypnea or respiratory distress.     Breath sounds: Normal breath sounds. No decreased breath sounds, wheezing, rhonchi or rales.  Chest:     Chest wall: No tenderness.  Abdominal:     General: Bowel sounds are normal.     Palpations: Abdomen is soft.  Musculoskeletal:        General: Normal range of motion.     Cervical back: Normal range of motion.  Skin:    General: Skin is warm and dry.  Neurological:     Mental  Status: She is alert and oriented to person, place, and time.     Coordination: Coordination normal.     Gait: Gait abnormal.  Psychiatric:        Behavior: Behavior normal. Behavior is cooperative.        Thought Content: Thought content normal.        Judgment: Judgment normal.          Patient has been counseled extensively about nutrition and exercise as well as the importance of adherence with medications and regular follow-up. The patient was given clear instructions to go to ER or return to medical center if symptoms don't improve, worsen or new problems develop. The patient verbalized understanding.   Follow-up: Return if symptoms  worsen or fail to improve.   Claiborne Rigg, FNP-BC [facility] [place], [place] [phone]   05/08/2023, 12:17 PM

## 2023-05-11 ENCOUNTER — Other Ambulatory Visit: Payer: Self-pay | Admitting: Nurse Practitioner

## 2023-05-11 ENCOUNTER — Telehealth: Payer: Self-pay

## 2023-05-11 DIAGNOSIS — E1140 Type 2 diabetes mellitus with diabetic neuropathy, unspecified: Secondary | ICD-10-CM

## 2023-05-11 MED ORDER — ACCU-CHEK GUIDE TEST VI STRP: ORAL_STRIP | 12 refills | Status: AC

## 2023-05-11 NOTE — Telephone Encounter (Signed)
 Copied from CRM [id number]. Topic: Clinical - Prescription Issue >> May 11, 2023  9:55 AM Eunice Blase wrote: Reason for CRM: Received call from [facility] - [place], [place] - [address] Phone: [phone] Fax: [phone] per Corrie Dandy regarding glucose blood (ACCU-CHEK GUIDE TEST) test strip, need to know how often pt should test per day. Please contact pharmacy.

## 2023-06-09 ENCOUNTER — Ambulatory Visit (INDEPENDENT_AMBULATORY_CARE_PROVIDER_SITE_OTHER): Admitting: Orthopaedic Surgery

## 2023-06-09 ENCOUNTER — Other Ambulatory Visit (INDEPENDENT_AMBULATORY_CARE_PROVIDER_SITE_OTHER)

## 2023-06-09 DIAGNOSIS — M79642 Pain in left hand: Secondary | ICD-10-CM

## 2023-06-09 DIAGNOSIS — M79641 Pain in right hand: Secondary | ICD-10-CM | POA: Diagnosis not present

## 2023-06-09 MED ORDER — LIDOCAINE HCL 1 % IJ SOLN
1.0000 mL | INTRAMUSCULAR | Status: AC | PRN
Start: 2023-06-09 — End: 2023-06-09
  Administered 2023-06-09: 1 mL

## 2023-06-09 MED ORDER — BUPIVACAINE HCL 0.5 % IJ SOLN
1.0000 mL | INTRAMUSCULAR | Status: AC | PRN
  Administered 2023-06-09: 1 mL

## 2023-06-09 MED ORDER — METHYLPREDNISOLONE ACETATE 40 MG/ML IJ SUSP
40.0000 mg | INTRAMUSCULAR | Status: AC | PRN
  Administered 2023-06-09: 40 mg

## 2023-06-09 NOTE — Progress Notes (Signed)
 Office Visit Note   Patient: Marisa [name]           Date of Birth: 10-08-1975           MRN: [id number] Visit Date: 06/09/2023              Requested by: Collins Dean, NP [address] [place] [address] [place],  [place] [address] PCP: Pcp, No   Assessment & Plan: Visit Diagnoses:  1. Pain in both hands     Plan: Patient is a 48 year old female with bilateral hand pain and numbness and tingling.  2023 nerve conduction studies were negative for carpal tunnel syndrome.  Clinically she is presenting with carpal tunnel syndrome.  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  Recommend symptomatic treatment for this.  She has tried nighttime splinting and they have not helped her symptoms therefore she would like to try bilateral carpal tunnel injections.  She tolerated the injections well.  Follow-Up Instructions: No follow-ups on file.   Orders:  Orders Placed This Encounter  Procedures   Hand/UE Inj   XR Hand Complete Left   XR Hand Complete Right   No orders of the defined types were placed in this encounter.     Procedures: Hand/UE Inj: bilateral carpal tunnel for carpal tunnel syndrome on 06/09/2023 4:46 PM Indications: pain Details: 25 G needle Medications (Right): 1 mL lidocaine  1 %; 1 mL bupivacaine  0.5 %; 40 mg methylPREDNISolone  acetate 40 MG/ML Medications (Left): 1 mL lidocaine  1 %; 1 mL bupivacaine  0.5 %; 40 mg methylPREDNISolone  acetate 40 MG/ML Outcome: tolerated well, no immediate complications Patient was prepped and draped in the usual sterile fashion.       Clinical Data: No additional findings.   Subjective: Chief Complaint  Patient presents with   Left Hand - Pain   Right Hand - Pain    HPI Patient is a 48 year old female comes in for evaluation of bilateral hand pain with numbness and tingling into the fingers and the wrist.  Has trouble grasping due to numbness.  Has pain that wakes her up at night.   Right-hand-dominant.  Reports sharp stabbing pain. Review of Systems  Constitutional: Negative.   HENT: Negative.    Eyes: Negative.   Respiratory: Negative.    Cardiovascular: Negative.   Endocrine: Negative.   Musculoskeletal: Negative.   Neurological: Negative.   Hematological: Negative.   Psychiatric/Behavioral: Negative.    All other systems reviewed and are negative.    Objective: Vital Signs: LMP 11/27/2012   Physical Exam Vitals and nursing note reviewed.  Constitutional:      Appearance: She is well-developed.  HENT:     Head: Atraumatic.     Nose: Nose normal.  Eyes:     Extraocular Movements: Extraocular movements intact.  Cardiovascular:     Pulses: Normal pulses.  Pulmonary:     Effort: Pulmonary effort is normal.  Abdominal:     Palpations: Abdomen is soft.  Musculoskeletal:     Cervical back: Neck supple.  Skin:    General: Skin is warm.     Capillary Refill: Capillary refill takes less than 2 seconds.  Neurological:     Mental Status: She is alert. Mental status is at baseline.  Psychiatric:        Behavior: Behavior normal.        Thought Content: Thought content normal.        Judgment: Judgment normal.     Ortho  Exam Examination bilateral hands show no muscle atrophy.  Subjective dysesthesias.  Negative carpal tunnel compressive signs.  Normal capillary refill. Specialty Comments:  No specialty comments available.  Imaging: No results found.   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   Morbid obesity (HCC) 12/24/2020   Allergic rhinitis 06/26/2020   Hyperlipidemia associated with type 2 diabetes mellitus (HCC) 04/10/2020   Chronic pain syndrome 04/09/2020   Type 2 diabetes mellitus with diabetic neuropathy, without long-term current use of insulin  (HCC) 04/09/2020   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 12/13/2019   Vitamin D  deficiency 04/05/2019   Class 2 severe obesity with serious comorbidity and body mass index (BMI) of 36.0 to 36.9 in adult (HCC) 04/05/2019   Obesity (BMI 30-39.9) 03/05/2018   Chronic diarrhea    Benign neoplasm of transverse colon    Benign neoplasm of sigmoid colon    Neuropathic pain of both legs 06/04/2016   Rash and nonspecific skin eruption 07/12/2015   Dandruff 03/26/2015   Migraine variant with headache 05/09/2014   GAD (generalized anxiety disorder) 05/04/2014   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   Social anxiety disorder 05/04/2014   PTSD (post-traumatic stress disorder) 05/04/2014   Depression 03/27/2014   OSA (obstructive sleep apnea) 03/06/2014   Insomnia 03/06/2014   History of cardiac arrest    Chest pain 02/20/2014   Rectal bleeding 07/25/2013   S/P Total vaginal hysterectomy on 01/06/13 01/06/2013   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   Past Medical History:  Diagnosis Date   Abdominal pain 04/26/2013   Abnormal uterine bleeding (AUB) 10/11/2012   Acute bronchitis    Allergy    Anal pain    chronic   Anemia    Anxiety    Asthma    exacerbation 02-28-2014 and 02-23-2014 secondary to Rhinovirus   Atypical chest pain 04/26/2013   Benign neoplasm of sigmoid colon    Benign neoplasm of transverse colon    Carbuncle of labium 07/12/2015   Chest pain 02/20/2014   Chronic diarrhea    Chronic headaches    Chronic low back pain    Cigarette nicotine  dependence without complication 05/04/2014   Cyst of right ovary    Dandruff 03/26/2015   Diabetes mellitus without complication (HCC) 10/13/2013   Diabetic neuropathy, painful (HCC) 12/13/2019   Difficult intravenous access    PER PT NEEDS PICC LINE   Dyspnea 09/07/2012   Spiro 08/2012:  No obstruction by FEV1%, but probable restriction.     Falls 03/27/2014   Food allergy    Mushrooms, shellfish   GAD (generalized anxiety disorder) 05/04/2014   Gait disturbance 04/11/2014   Gait instability    GERD (gastroesophageal reflux disease)    History of adenomatous polyp of colon    History of cardiac arrest    during SVD 1992   History of ectopic pregnancy    2009-  S/P LEFT SALPINGECTOMY  History of panic attacks    Hyperlipidemia    IBS (irritable bowel syndrome)    Insomnia 03/06/2014   Joint pain    Lower extremity edema    Lumbar stenosis L4 -- L5 with bulging disk   w/ right leg weakness/ decreased mobility   Migraine variant with headache 05/09/2014   Mild obstructive sleep apnea    study 03-20-2014  no cpap recommended   Nausea and vomiting 09/16/2022   Neuromuscular disorder (HCC)    neuropathy in feet    Neuropathic pain of both legs 06/04/2016   Obesity (BMI 30-39.9) 03/05/2018   OSA (obstructive sleep apnea) 03/06/2014   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   Panniculitis 03/05/2018   Pelvic pain 07/25/2013   Persistent vomiting 04/27/2013   Pneumonia    PTSD (post-traumatic stress disorder) 05/04/2014   Rash and nonspecific skin eruption 07/12/2015   Rectal bleeding 07/25/2013   RLQ abdominal pain    S/P Total vaginal hysterectomy on 01/06/13 01/06/2013   Sleep apnea    mild no cpap   Social anxiety disorder 05/04/2014   Sore throat 02/20/2014   Stomach ulcer    Tachycardia 02/20/2014   Type 2 diabetes mellitus (HCC)    Weakness of right leg    FROM BACK PROBLEM PER PT    Family History  Problem Relation Age of Onset   Hypertension Mother     Diabetes Mother    Allergies Mother    Heart disease Mother    Clotting disorder Mother    Stroke Mother    Kidney disease Mother    Thyroid  disease Mother    Cancer Father    Hyperlipidemia Father    Hypertension Father    Colon cancer Father    Liver disease Father    Heart disease Maternal Grandmother    Breast cancer Maternal Grandmother 65   Schizophrenia Sister    Bipolar disorder Sister    Clotting disorder Sister    Bipolar disorder Brother    Kidney disease Brother    Bipolar disorder Sister    Pancreatic cancer Maternal Aunt    Prostate cancer Maternal Uncle    Liver cancer Maternal Grandfather    Rectal cancer Maternal Grandfather    Liver cancer Paternal Grandfather    Colon polyps Neg Hx    Esophageal cancer Neg Hx    Stomach cancer Neg Hx     Past Surgical History:  Procedure Laterality Date   ABDOMINAL HYSTERECTOMY     partial   BIOPSY  01/22/2023   Procedure: BIOPSY;  Surgeon: Albertina Hugger, MD;  Location: [facility];  Service: Gastroenterology;;   COLONOSCOPY Left 04/29/2013   Procedure: COLONOSCOPY;  Surgeon: Evangeline Hilts, MD;  Location: [facility];  Service: Endoscopy;  Laterality: Left;   COLONOSCOPY     COLONOSCOPY WITH PROPOFOL  N/A 08/01/2016   Procedure: COLONOSCOPY WITH PROPOFOL ;  Surgeon: Albertina Hugger, MD;  Location: [facility];  Service: Gastroenterology;  Laterality: N/A;   COLONOSCOPY WITH PROPOFOL  N/A 01/22/2023   Procedure: COLONOSCOPY WITH PROPOFOL ;  Surgeon: Albertina Hugger, MD;  Location: [facility];  Service: Gastroenterology;  Laterality: N/A;   ECTOPIC PREGNANCY SURGERY     ESOPHAGOGASTRODUODENOSCOPY (EGD) WITH PROPOFOL  N/A 11/10/2018   Procedure: ESOPHAGOGASTRODUODENOSCOPY (EGD) WITH PROPOFOL ;  Surgeon: Albertina Hugger, MD;  Location: [facility];  Service: Gastroenterology;  Laterality: N/A;   EVALUATION UNDER ANESTHESIA WITH FISTULECTOMY N/A 04/20/2014   Procedure: EXAM UNDER ANESTHESIA ;  Surgeon: Evalyn Hillier  Andy Bannister, MD;  Location: [facility];  Service: General;  Laterality: N/A;   FLEXIBLE SIGMOIDOSCOPY N/A 11/09/2013   Procedure: FLEXIBLE SIGMOIDOSCOPY;  Surgeon: Evangeline Hilts, MD;  Location: [facility];  Service: Endoscopy;  Laterality: N/A;   fupa removal      LAPAROSCOPIC CHOLECYSTECTOMY  2005   LAPAROSCOPIC GASTRIC SLEEVE RESECTION N/A 12/24/2020   Procedure: LAPAROSCOPIC GASTRIC SLEEVE RESECTION;  Surgeon: Adalberto Acton, MD;  Location: [facility];  Service: General;  Laterality: N/A;   REFRACTIVE SURGERY     SPHINCTEROTOMY N/A 04/20/2014   Procedure:  LATERAL INTERNAL SPHINCTEROTOMY;  Surgeon: Joyce Nixon, MD;  Location: [facility];  Service: General;  Laterality: N/A;   TRANSTHORACIC ECHOCARDIOGRAM  12/30/2012   mild LVH/  ef 55-60%   UNILATERAL SALPINGECTOMY  2009   laparotomy left salpingectomy-- ectopic preg.   UPPER GASTROINTESTINAL ENDOSCOPY     UPPER GI ENDOSCOPY N/A 12/24/2020   Procedure: UPPER GI ENDOSCOPY;  Surgeon: Adalberto Acton, MD;  Location: [facility];  Service: General;  Laterality: N/A;   VAGINAL HYSTERECTOMY N/A 01/06/2013   Procedure: HYSTERECTOMY VAGINAL;  Surgeon: Julianne Octave, MD;  Location: [facility];  Service: Gynecology;  Laterality: N/A;   Social History   Occupational History   Occupation: disability  Tobacco Use   Smoking status: Former    Current packs/day: 0.00    Average packs/day: 0.2 packs/day for 11.0 years (2.2 ttl pk-yrs)    Types: Cigarettes    Start date: 11/18/2002    Quit date: 11/17/2013    Years since quitting: 9.5   Smokeless tobacco: Never   Tobacco comments:    2 years quit  Vaping Use   Vaping status: Never Used  Substance and Sexual Activity   Alcohol use: No    Alcohol/week: 0.0 standard drinks of alcohol   Drug use: No   Sexual activity: Yes    Birth control/protection: Surgical

## 2023-06-30 ENCOUNTER — Emergency Department (HOSPITAL_COMMUNITY)

## 2023-06-30 ENCOUNTER — Other Ambulatory Visit: Payer: Self-pay

## 2023-06-30 ENCOUNTER — Emergency Department (HOSPITAL_COMMUNITY)
Admission: EM | Admit: 2023-06-30 | Discharge: 2023-07-01 | Disposition: A | Discharge status: Home / Self Care | Attending: Student | Admitting: Student

## 2023-06-30 DIAGNOSIS — Y908 Blood alcohol level of 240 mg/100 ml or more: Secondary | ICD-10-CM | POA: Insufficient documentation

## 2023-06-30 DIAGNOSIS — G43109 Migraine with aura, not intractable, without status migrainosus: Secondary | ICD-10-CM | POA: Diagnosis not present

## 2023-06-30 DIAGNOSIS — R531 Weakness: Secondary | ICD-10-CM | POA: Insufficient documentation

## 2023-06-30 DIAGNOSIS — R519 Headache, unspecified: Secondary | ICD-10-CM | POA: Insufficient documentation

## 2023-06-30 DIAGNOSIS — H53149 Visual discomfort, unspecified: Secondary | ICD-10-CM | POA: Insufficient documentation

## 2023-06-30 DIAGNOSIS — R200 Anesthesia of skin: Secondary | ICD-10-CM | POA: Diagnosis not present

## 2023-06-30 DIAGNOSIS — F10920 Alcohol use, unspecified with intoxication, uncomplicated: Secondary | ICD-10-CM | POA: Insufficient documentation

## 2023-06-30 DIAGNOSIS — Z79899 Other long term (current) drug therapy: Secondary | ICD-10-CM | POA: Insufficient documentation

## 2023-06-30 LAB — COMPREHENSIVE METABOLIC PANEL WITH GFR
ALT: 22 U/L (ref 0–44)
AST: 21 U/L (ref 15–41)
Albumin: 3.2 g/dL — ABNORMAL LOW (ref 3.5–5.0)
Alkaline Phosphatase: 64 U/L (ref 38–126)
Anion gap: 13 (ref 5–15)
BUN: 8 mg/dL (ref 6–20)
CO2: 21 mmol/L — ABNORMAL LOW (ref 22–32)
Calcium: 9 mg/dL (ref 8.9–10.3)
Chloride: 109 mmol/L (ref 98–111)
Creatinine, Ser: 0.69 mg/dL (ref 0.44–1.00)
GFR, Estimated: >60 mL/min (ref >60)
Glucose, Bld: 124 mg/dL — ABNORMAL HIGH (ref 70–99)
Potassium: 3.9 mmol/L (ref 3.5–5.1)
Sodium: 143 mmol/L (ref 135–145)
Total Bilirubin: 0.5 mg/dL (ref 0.0–1.2)
Total Protein: 6.8 g/dL (ref 6.5–8.1)

## 2023-06-30 LAB — I-STAT VENOUS BLOOD GAS, ED
Acid-base deficit: 2 mmol/L (ref 0.0–2.0)
Bicarbonate: 22 mmol/L (ref 20.0–28.0)
Calcium, Ion: 1.11 mmol/L — ABNORMAL LOW (ref 1.15–1.40)
HCT: 39 % (ref 36.0–46.0)
Hemoglobin: 13.3 g/dL (ref 12.0–15.0)
O2 Saturation: 91 %
Potassium: 3.8 mmol/L (ref 3.5–5.1)
Sodium: 144 mmol/L (ref 135–145)
TCO2: 23 mmol/L (ref 22–32)
pCO2, Ven: 34.9 mmHg — ABNORMAL LOW (ref 44–60)
pH, Ven: 7.407 (ref 7.25–7.43)
pO2, Ven: 61 mmHg — ABNORMAL HIGH (ref 32–45)

## 2023-06-30 LAB — CBC
HCT: 41.4 % (ref 36.0–46.0)
Hemoglobin: 13.7 g/dL (ref 12.0–15.0)
MCH: 32.2 pg (ref 26.0–34.0)
MCHC: 33.1 g/dL (ref 30.0–36.0)
MCV: 97.2 fL (ref 80.0–100.0)
Platelets: 333 10*3/uL (ref 150–400)
RBC: 4.26 MIL/uL (ref 3.87–5.11)
RDW: 12.9 % (ref 11.5–15.5)
WBC: 6.5 10*3/uL (ref 4.0–10.5)
nRBC: 0 % (ref 0.0–0.2)

## 2023-06-30 LAB — DIFFERENTIAL
Abs Immature Granulocytes: 0.02 10*3/uL (ref 0.00–0.07)
Basophils Absolute: 0 10*3/uL (ref 0.0–0.1)
Basophils Relative: 1 %
Eosinophils Absolute: 0.1 10*3/uL (ref 0.0–0.5)
Eosinophils Relative: 1 %
Immature Granulocytes: 0 %
Lymphocytes Relative: 61 %
Lymphs Abs: 4 10*3/uL (ref 0.7–4.0)
Monocytes Absolute: 0.5 10*3/uL (ref 0.1–1.0)
Monocytes Relative: 8 %
Neutro Abs: 1.9 10*3/uL (ref 1.7–7.7)
Neutrophils Relative %: 29 %

## 2023-06-30 LAB — I-STAT CHEM 8, ED
BUN: 7 mg/dL (ref 6–20)
Calcium, Ion: 1.12 mmol/L — ABNORMAL LOW (ref 1.15–1.40)
Chloride: 107 mmol/L (ref 98–111)
Creatinine, Ser: 1 mg/dL (ref 0.44–1.00)
Glucose, Bld: 120 mg/dL — ABNORMAL HIGH (ref 70–99)
HCT: 40 % (ref 36.0–46.0)
Hemoglobin: 13.6 g/dL (ref 12.0–15.0)
Potassium: 3.8 mmol/L (ref 3.5–5.1)
Sodium: 145 mmol/L (ref 135–145)
TCO2: 22 mmol/L (ref 22–32)

## 2023-06-30 LAB — HCG, SERUM, QUALITATIVE: Preg, Serum: NEGATIVE

## 2023-06-30 LAB — APTT: aPTT: 23 s — ABNORMAL LOW (ref 24–36)

## 2023-06-30 LAB — CBG MONITORING, ED: Glucose-Capillary: 134 mg/dL — ABNORMAL HIGH (ref 70–99)

## 2023-06-30 LAB — PROTIME-INR
INR: 1 (ref 0.8–1.2)
Prothrombin Time: 13 s (ref 11.4–15.2)

## 2023-06-30 LAB — ETHANOL: Alcohol, Ethyl (B): 275 mg/dL — ABNORMAL HIGH (ref <15)

## 2023-06-30 MED ORDER — PROCHLORPERAZINE EDISYLATE 10 MG/2ML IJ SOLN
10.0000 mg | Freq: Once | INTRAMUSCULAR | Status: AC
  Administered 2023-06-30: 10 mg via INTRAVENOUS
  Filled 2023-06-30: qty 2

## 2023-06-30 MED ORDER — IOHEXOL 350 MG/ML SOLN
100.0000 mL | Freq: Once | INTRAVENOUS | Status: AC | PRN
  Administered 2023-06-30: 100 mL via INTRAVENOUS

## 2023-06-30 MED ORDER — SODIUM CHLORIDE 0.9 % IV BOLUS
1000.0000 mL | Freq: Once | INTRAVENOUS | Status: AC
  Administered 2023-06-30: 1000 mL via INTRAVENOUS

## 2023-06-30 MED ORDER — DIPHENHYDRAMINE HCL 50 MG/ML IJ SOLN
12.5000 mg | Freq: Once | INTRAMUSCULAR | Status: AC
  Administered 2023-06-30: 12.5 mg via INTRAVENOUS
  Filled 2023-06-30: qty 1

## 2023-06-30 MED ORDER — LORAZEPAM 2 MG/ML IJ SOLN
1.0000 mg | Freq: Once | INTRAMUSCULAR | Status: AC
  Administered 2023-06-30: 1 mg via INTRAVENOUS
  Filled 2023-06-30: qty 1

## 2023-06-30 NOTE — ED Triage Notes (Signed)
 Pt BIB [facility] from home. Pts sister come home and found pt confused, L side numb and weakness, slurred speech, slow to respond and headache. Per son LSN at 1600. A/ox4

## 2023-06-30 NOTE — Consult Note (Addendum)
 NEUROLOGY CONSULT NOTE   Date of service: Jun 30, 2023 Patient Name: Marisa [name] MRN:  [id number] DOB:  08-Oct-1975 Chief Complaint: "Left-sided weakness" Requesting Provider: Karlyn Overman, MD  History of Present Illness  Marisa [name] is a 47 y.o. female with hx of headaches that make her want a lay down in a dark room who presents with left-sided weakness that started today.  She states that she has been feeling off since early today, unclear exactly when.  Her son talked to her at 4 PM, and she seemed relatively normal at that time, but the patient states that she was already feeling significantly off.  In the emergency department she was evaluated and found to have significant left-sided weakness and therefore a code stroke was activated.  At baseline, she states that she gets headaches every day, sometimes with spots in her vision.  Currently she has a headache that is bifrontal associated with photophobia, and she describes visual changes associated with oscillating colors and zigzag lines that move in her visual field.  LKW: Unclear Modified rankin score: 0-Completely asymptomatic and back to baseline post- stroke IV Thrombolysis: No, outside of window EVT: No, no LVO   NIHSS components Score: Comment  1a Level of Conscious 0[x]  1[]  2[]  3[]      1b LOC Questions 0[x]  1[]  2[]       1c LOC Commands 0[x]  1[]  2[]       2 Best Gaze 0[x]  1[]  2[]       3 Visual 0[x]  1[]  2[]  3[]      4 Facial Palsy 0[]  1[x]  2[]  3[]      5a Motor Arm - left 0[]  1[]  2[x]  3[]  4[]  UN[]    5b Motor Arm - Right 0[]  1[]  2[x]  3[]  4[]  UN[]    6a Motor Leg - Left 0[]  1[]  2[x]  3[]  4[]  UN[]    6b Motor Leg - Right 0[]  1[]  2[x]  3[]  4[]  UN[]    7 Limb Ataxia 0[x]  1[]  2[]  UN[]      8 Sensory 0[]  1[]  2[x]  UN[]      9 Best Language 0[x]  1[]  2[]  3[]      10 Dysarthria 0[x]  1[]  2[]  UN[]      11 Extinct. and Inattention 0[x]  1[]  2[]       TOTAL: 11       Past History   Past Medical History:  Diagnosis Date    Abdominal pain 04/26/2013   Abnormal uterine bleeding (AUB) 10/11/2012   Acute bronchitis    Allergy    Anal pain    chronic   Anemia    Anxiety    Asthma    exacerbation 02-28-2014 and 02-23-2014 secondary to Rhinovirus   Atypical chest pain 04/26/2013   Benign neoplasm of sigmoid colon    Benign neoplasm of transverse colon    Carbuncle of labium 07/12/2015   Chest pain 02/20/2014   Chronic diarrhea    Chronic headaches    Chronic low back pain    Cigarette nicotine dependence without complication 05/04/2014   Cyst of right ovary    Dandruff 03/26/2015   Diabetes mellitus without complication (HCC) 10/13/2013   Diabetic neuropathy, painful (HCC) 12/13/2019   Difficult intravenous access    PER PT NEEDS PICC LINE   Dyspnea 09/07/2012   Spiro 08/2012:  No obstruction by FEV1%, but probable restriction.     Falls 03/27/2014   Food allergy    Mushrooms, shellfish   GAD (generalized anxiety disorder) 05/04/2014   Gait disturbance 04/11/2014   Gait instability    GERD (  gastroesophageal reflux disease)    History of adenomatous polyp of colon    History of cardiac arrest    during SVD 1992   History of ectopic pregnancy    2009-  S/P LEFT SALPINGECTOMY   History of panic attacks    Hyperlipidemia    IBS (irritable bowel syndrome)    Insomnia 03/06/2014   Joint pain    Lower extremity edema    Lumbar stenosis L4 -- L5 with bulging disk   w/ right leg weakness/ decreased mobility   Migraine variant with headache 05/09/2014   Mild obstructive sleep apnea    study 03-20-2014  no cpap recommended   Nausea and vomiting 09/16/2022   Neuromuscular disorder (HCC)    neuropathy in feet    Neuropathic pain of both legs 06/04/2016   Obesity (BMI 30-39.9) 03/05/2018   OSA (obstructive sleep apnea) 03/06/2014   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   Panniculitis 03/05/2018   Pelvic pain 07/25/2013   Persistent vomiting 04/27/2013   Pneumonia    PTSD (post-traumatic  stress disorder) 05/04/2014   Rash and nonspecific skin eruption 07/12/2015   Rectal bleeding 07/25/2013   RLQ abdominal pain    S/P Total vaginal hysterectomy on 01/06/13 01/06/2013   Sleep apnea    mild no cpap   Social anxiety disorder 05/04/2014   Sore throat 02/20/2014   Stomach ulcer    Tachycardia 02/20/2014   Type 2 diabetes mellitus (HCC)    Weakness of right leg    FROM BACK PROBLEM PER PT    Past Surgical History:  Procedure Laterality Date   ABDOMINAL HYSTERECTOMY     partial   BIOPSY  01/22/2023   Procedure: BIOPSY;  Surgeon: Albertina Hugger, MD;  Location: [facility];  Service: Gastroenterology;;   COLONOSCOPY Left 04/29/2013   Procedure: COLONOSCOPY;  Surgeon: Evangeline Hilts, MD;  Location: [facility];  Service: Endoscopy;  Laterality: Left;   COLONOSCOPY     COLONOSCOPY WITH PROPOFOL  N/A 08/01/2016   Procedure: COLONOSCOPY WITH PROPOFOL ;  Surgeon: Albertina Hugger, MD;  Location: [facility];  Service: Gastroenterology;  Laterality: N/A;   COLONOSCOPY WITH PROPOFOL  N/A 01/22/2023   Procedure: COLONOSCOPY WITH PROPOFOL ;  Surgeon: Albertina Hugger, MD;  Location: [facility];  Service: Gastroenterology;  Laterality: N/A;   ECTOPIC PREGNANCY SURGERY     ESOPHAGOGASTRODUODENOSCOPY (EGD) WITH PROPOFOL  N/A 11/10/2018   Procedure: ESOPHAGOGASTRODUODENOSCOPY (EGD) WITH PROPOFOL ;  Surgeon: Albertina Hugger, MD;  Location: [facility];  Service: Gastroenterology;  Laterality: N/A;   EVALUATION UNDER ANESTHESIA WITH FISTULECTOMY N/A 04/20/2014   Procedure: EXAM UNDER ANESTHESIA ;  Surgeon: Joyce Nixon, MD;  Location[facility]  Service: General;  Laterality: N/A;   FLEXIBLE SIGMOIDOSCOPY N/A 11/09/2013   Procedure: FLEXIBLE SIGMOIDOSCOPY;  Surgeon: Evangeline Hilts, MD;  Location: [facility];  Service: Endoscopy;  Laterality: N/A;   fupa removal      LAPAROSCOPIC CHOLECYSTECTOMY  2005   LAPAROSCOPIC GASTRIC SLEEVE RESECTION N/A 12/24/2020    Procedure: LAPAROSCOPIC GASTRIC SLEEVE RESECTION;  Surgeon: Adalberto Acton, MD;  Location: [facility];  Service: General;  Laterality: N/A;   REFRACTIVE SURGERY     SPHINCTEROTOMY N/A 04/20/2014   Procedure:  LATERAL INTERNAL SPHINCTEROTOMY;  Surgeon: Joyce Nixon, MD;  Location: [facility];  Service: General;  Laterality: N/A;   TRANSTHORACIC ECHOCARDIOGRAM  12/30/2012   mild LVH/  ef 55-60%   UNILATERAL SALPINGECTOMY  2009   laparotomy left salpingectomy-- ectopic preg.  UPPER GASTROINTESTINAL ENDOSCOPY     UPPER GI ENDOSCOPY N/A 12/24/2020   Procedure: UPPER GI ENDOSCOPY;  Surgeon: Adalberto Acton, MD;  Location: [facility];  Service: General;  Laterality: N/A;   VAGINAL HYSTERECTOMY N/A 01/06/2013   Procedure: HYSTERECTOMY VAGINAL;  Surgeon: Julianne Octave, MD;  Location: [facility];  Service: Gynecology;  Laterality: N/A;    Family History: Family History  Problem Relation Age of Onset   Hypertension Mother    Diabetes Mother    Allergies Mother    Heart disease Mother    Clotting disorder Mother    Stroke Mother    Kidney disease Mother    Thyroid  disease Mother    Cancer Father    Hyperlipidemia Father    Hypertension Father    Colon cancer Father    Liver disease Father    Heart disease Maternal Grandmother    Breast cancer Maternal Grandmother 52   Schizophrenia Sister    Bipolar disorder Sister    Clotting disorder Sister    Bipolar disorder Brother    Kidney disease Brother    Bipolar disorder Sister    Pancreatic cancer Maternal Aunt    Prostate cancer Maternal Uncle    Liver cancer Maternal Grandfather    Rectal cancer Maternal Grandfather    Liver cancer Paternal Grandfather    Colon polyps Neg Hx    Esophageal cancer Neg Hx    Stomach cancer Neg Hx     Social History  reports that she quit smoking about 9 years ago. Her smoking use included cigarettes. She started smoking about 20 years ago. She has a 2.2 pack-year smoking history. She has  never used smokeless tobacco. She reports that she does not drink alcohol and does not use drugs.  Allergies  Allergen Reactions   Asa [Aspirin] Anaphylaxis and Hives    Hives, chest tightness    Mushroom Extract Complex (Obsolete) Anaphylaxis, Swelling and Other (See Comments)    Reaction:  Eye swelling   Penicillins Anaphylaxis    Anaphylaxis ~48 yo necessitating ED visit (took pink thick liquid) - cannot recall taking any cephalosporins   Shellfish Allergy Anaphylaxis   Triamcinolone  Other (See Comments)    Skin issues    Zoloft  [place].Cough ]     Jittery feeling    Medications  No current facility-administered medications for this encounter.  Current Outpatient Medications:    Accu-Chek Softclix Lancets lancets, Use as instructed, Disp: 100 each, Rfl: 12   albuterol  (PROVENTIL  HFA) 108 (90 Base) MCG/ACT inhaler, Inhale 2 puffs into the lungs every 6 (six) hours as needed for wheezing or shortness of breath., Disp: 6.7 g, Rfl: 2   albuterol  (PROVENTIL ) (2.5 MG/3ML) 0.083% nebulizer solution, Take 3 mLs (2.5 mg total) by nebulization every 6 (six) hours as needed for wheezing or shortness of breath., Disp: 150 mL, Rfl: 1   atorvastatin  (LIPITOR) 10 MG tablet, Take 1 tablet (10 mg total) by mouth daily., Disp: 90 tablet, Rfl: 1   Blood Glucose Monitoring Suppl (ACCU-CHEK GUIDE) w/Device KIT, Check blood sugars daily, Disp: 1 kit, Rfl: 0   busPIRone  (BUSPAR ) 15 MG tablet, Take 1 tablet (15 mg total) by mouth 2 (two) times daily., Disp: 60 tablet, Rfl: 3   dicyclomine  (BENTYL ) 10 MG capsule, Take 1 capsule (10 mg total) by mouth 3 (three) times daily before meals., Disp: 90 capsule, Rfl: 1   DULoxetine  40 MG CPEP, Take 1 capsule (40 mg total) by mouth daily., Disp: 90 capsule, Rfl: 1  EPINEPHrine  0.3 mg/0.3 mL IJ SOAJ injection, Inject 0.3 mg into the muscle as needed for anaphylaxis., Disp: 1 each, Rfl: 1   gabapentin  (NEURONTIN ) 300 MG capsule, Take 1 capsule (300 mg total) by mouth 2  (two) times daily., Disp: 180 capsule, Rfl: 1   glucose blood (ACCU-CHEK GUIDE TEST) test strip, Use as instructed. Check blood glucose level by fingerstick 1-2 times per day. E11.65, Disp: 100 each, Rfl: 12   HYDROcodone -acetaminophen  (NORCO/VICODIN) 5-325 MG tablet, Take 1 tablet by mouth every 6 (six) hours as needed. (Patient not taking: Reported on 05/08/2023), Disp: 15 tablet, Rfl: 0   Multiple Vitamins-Minerals (BARIATRIC MULTIVITAMINS/IRON PO), Take 1 tablet by mouth daily., Disp: , Rfl:    ondansetron  (ZOFRAN ) 4 MG tablet, Take 1 tablet (4 mg total) by mouth every 6 (six) hours as needed for nausea., Disp: 20 tablet, Rfl: 0   potassium chloride  SA (KLOR-CON  M) 20 MEQ tablet, Take 2 tablets (40 mEq total) by mouth daily., Disp: 60 tablet, Rfl: 1   SUMAtriptan  (IMITREX ) 100 MG tablet, Take 1 tablet at the start of the headache., Disp: 12 tablet, Rfl: 3   topiramate  (TOPAMAX ) 25 MG tablet, TAKE 1 TABLET BY MOUTH AT BEDTIME TO  DECREASE  MIGRAINE  FREQUENCY, Disp: 90 tablet, Rfl: 0   Vitamin D , Ergocalciferol , (DRISDOL ) 1.25 MG (50000 UNIT) CAPS capsule, Take 1 capsule (50,000 Units total) by mouth every 7 (seven) days., Disp: 12 capsule, Rfl: 1  Vitals  There were no vitals filed for this visit.  There is no height or weight on file to calculate BMI.  Physical Exam   Constitutional: Appears well-developed and well-nourished.   Neurologic Examination    Neuro: Mental Status: She is awake, alert.  She has a halting speech pattern, that appears nonorganic Cranial Nerves: II: Visual Fields are full. Pupils are equal, round, and reactive to light.   III,IV, VI: EOMI without ptosis or diploplia.  V: She splits midline to sensation on the forehead VII: She does not smile as much with the left side of her face as the right, but this is not consistent VIII: hearing is intact to voice X: Uvula elevates symmetrically XII: tongue is midline without atrophy or fasciculations.  Motor: She has  a markedly inconsistent exam, initially appearing completely flaccid in the arm, but after I encouraged her repeatedly she was able to hold it aloft without drift.  She also has an inconsistent exam of the right, but is able to move all extremities against gravity and hold them there for at least a brief period of time before giveaway weakness. Sensory: Diminished on the left Cerebellar: No clear ataxia in either arm, of note she was flaccid prior to me "getting it started" in the left arm but then she was able to perform finger-nose-finger without ataxia       Labs/Imaging/Neurodiagnostic studies   CBC:  Recent Labs  Lab Jul 16, 2023 2031  HGB 13.3  13.6  HCT 39.0  40.0   Basic Metabolic Panel:  Lab Results  Component Value Date   NA 145 07/16/23   NA 144 Jul 16, 2023   K 3.8 07/16/2023   K 3.8 2023-07-16   CO2 23 02/27/2023   GLUCOSE 120 (H) 16-Jul-2023   BUN 7 07/16/23   CREATININE 1.00 Jul 16, 2023   CALCIUM  8.4 (L) 02/27/2023   GFRNONAA >60 02/27/2023   GFRAA 121 11/25/2019   Lipid Panel:  Lab Results  Component Value Date   LDLCALC 117 (H) 12/18/2022   HgbA1c:  Lab Results  Component Value Date   HGBA1C 5.5 12/18/2022   Urine Drug Screen: No results found for: "LABOPIA", "COCAINSCRNUR", "LABBENZ", "AMPHETMU", "THCU", "LABBARB"  Alcohol Level     Component Value Date/Time   ETH 118 (H) 10/27/2021 1627   INR  Lab Results  Component Value Date   INR 1.0 09/14/2022   APTT  Lab Results  Component Value Date   APTT 27 09/14/2022   AED levels: No results found for: "PHENYTOIN", "ZONISAMIDE", "LAMOTRIGINE", "LEVETIRACETA"  CT Head without contrast(Personally reviewed): Negative  CT angio Head and Neck with contrast(Personally reviewed): Negative  ASSESSMENT   Gabija E [name] is a 48 y.o. female with a history of daily headaches who presents with left-sided tingling, weakness, visual aura in the setting of photophobic headache.  Much of her  physical exam is most consistent with a nonorganic presentation, but it is difficult to rule out a possible contribution from complicated migraine and therefore I would favor treating it as such.  I explained that I expected to get better as we treated her headache to give her "permission to improve."  Based on the physical exam, I do not think we need further imaging unless the patient does not improve.  RECOMMENDATIONS  Migraine cocktail If she continues to have symptoms, could consider an MRI of the brain but I think this is very low yield.  If the MRI is negative, then would treat this as conversion reaction. ______________________________________________________________________    Flint Hummer, MD [facility]

## 2023-06-30 NOTE — ED Provider Notes (Signed)
 [place] EMERGENCY DEPARTMENT AT [facility] Provider Note   CSN: [id number] Arrival date & time: 06/30/23  2018    History  AMS   Marisa [name] is a 48 y.o. female here for evaluation of left-sided weakness and dysphagia.  Apparently son saw her last known normal at 4:00.  She saw her sister this evening and she was noted to have some confusion and left-sided weakness.  EMS was subsequently called.  EMS did not call code stroke as they were unsure of her last known normal.  Patient states she has a headache.  Denies any recent falls.  States she feels "bad."  Apparently deals with frequent headaches. No fever, CP, SOB, abd pain. Compliant with home meds.  On arrival EMS speaking with son Forestine Igo who stated last known normal 1600. Code stroke called at that time.  HPI     Home Medications Prior to Admission medications   Medication Sig Start Date End Date Taking? Authorizing Provider  Accu-Chek Softclix Lancets lancets Use as instructed 09/04/22   Vernell Goldsmith, MD  albuterol  (PROVENTIL  HFA) 108 [phone] Base) MCG/ACT inhaler Inhale 2 puffs into the lungs every 6 (six) hours as needed for wheezing or shortness of breath. 05/08/23   Collins Dean, NP  albuterol  (PROVENTIL ) (2.5 MG/3ML) 0.083% nebulizer solution Take 3 mLs (2.5 mg total) by nebulization every 6 (six) hours as needed for wheezing or shortness of breath. 09/04/22   Vernell Goldsmith, MD  atorvastatin  (LIPITOR) 10 MG tablet Take 1 tablet (10 mg total) by mouth daily. 12/20/22   Lawrance Presume, MD  Blood Glucose Monitoring Suppl (ACCU-CHEK GUIDE) w/Device KIT Check blood sugars daily 09/04/22   Vernell Goldsmith, MD  busPIRone  (BUSPAR ) 15 MG tablet Take 1 tablet (15 mg total) by mouth 2 (two) times daily. 05/08/23   Fleming, Zelda W, NP  dicyclomine  (BENTYL ) 10 MG capsule Take 1 capsule (10 mg total) by mouth 3 (three) times daily before meals. 01/30/23   Albertina Hugger, MD  DULoxetine  40 MG CPEP Take 1  capsule (40 mg total) by mouth daily. 05/08/23   Fleming, Zelda W, NP  EPINEPHrine  0.3 mg/0.3 mL IJ SOAJ injection Inject 0.3 mg into the muscle as needed for anaphylaxis. 09/04/22   Vernell Goldsmith, MD  gabapentin  (NEURONTIN ) 300 MG capsule Take 1 capsule (300 mg total) by mouth 2 (two) times daily. 09/04/22   Vernell Goldsmith, MD  glucose blood (ACCU-CHEK GUIDE TEST) test strip Use as instructed. Check blood glucose level by fingerstick 1-2 times per day. E11.65 05/11/23   Fleming, Zelda W, NP  HYDROcodone -acetaminophen  (NORCO/VICODIN) 5-325 MG tablet Take 1 tablet by mouth every 6 (six) hours as needed. Patient not taking: Reported on 05/08/2023 03/02/23   Charity Conch, DPM  Multiple Vitamins-Minerals (BARIATRIC MULTIVITAMINS/IRON PO) Take 1 tablet by mouth daily.    [provider]  ondansetron  (ZOFRAN ) 4 MG tablet Take 1 tablet (4 mg total) by mouth every 6 (six) hours as needed for nausea. 12/18/22   Lawrance Presume, MD  potassium chloride  SA (KLOR-CON  M) 20 MEQ tablet Take 2 tablets (40 mEq total) by mouth daily. 12/18/22   Lawrance Presume, MD  SUMAtriptan  (IMITREX ) 100 MG tablet Take 1 tablet at the start of the headache. 12/18/22   Lawrance Presume, MD  topiramate  (TOPAMAX ) 25 MG tablet TAKE 1 TABLET BY MOUTH AT BEDTIME TO  DECREASE  MIGRAINE  FREQUENCY 03/02/23   Lawrance Presume,  MD  Vitamin D , Ergocalciferol , (DRISDOL ) 1.25 MG (50000 UNIT) CAPS capsule Take 1 capsule (50,000 Units total) by mouth every 7 (seven) days. 12/20/22   Lawrance Presume, MD      Allergies    Asa [aspirin], Mushroom extract complex (obsolete), Penicillins, Shellfish allergy, Triamcinolone , and Zoloft  [sertraline ]    Review of Systems   Review of Systems  Unable to perform ROS: Mental status change  Neurological:  Positive for weakness and headaches.  All other systems reviewed and are negative.   Physical Exam Updated Vital Signs BP 103/77   Pulse (!) 103   Temp 98.7 F (37.1 C)  (Oral)   Resp 15   Ht 5\' 5"  (1.651 m)   Wt 95.3 kg   LMP 11/27/2012   SpO2 95%   BMI 34.95 kg/m  Physical Exam Vitals and nursing note reviewed.  Constitutional:      General: She is not in acute distress.    Appearance: She is well-developed. She is not ill-appearing, toxic-appearing or diaphoretic.  HENT:     Head: Normocephalic and atraumatic.  Eyes:     Pupils: Pupils are equal, round, and reactive to light.  Cardiovascular:     Rate and Rhythm: Normal rate.     Pulses: Normal pulses.          Radial pulses are 2+ on the right side and 2+ on the left side.       Dorsalis pedis pulses are 2+ on the right side and 2+ on the left side.     Heart sounds: Normal heart sounds.  Pulmonary:     Effort: Pulmonary effort is normal. No respiratory distress.     Breath sounds: Normal breath sounds and air entry.  Abdominal:     General: Bowel sounds are normal. There is no distension.     Palpations: Abdomen is soft.     Tenderness: There is no abdominal tenderness.  Musculoskeletal:     Cervical back: Normal range of motion.  Skin:    General: Skin is warm and dry.  Neurological:     Mental Status: She is alert.     Cranial Nerves: Cranial nerve deficit and facial asymmetry present.     Sensory: Sensory deficit present.     Motor: Weakness present. No tremor or seizure activity.     Coordination: Finger-Nose-Finger Test abnormal and Heel to [place] Test abnormal.     Comments: Smile asymmetric, PERRLA, no nystagmus Decreased sensation left upper extremity, left lower extremity Slow finger-nose on right, cannot perform finger-nose on left, and falls on face.  Slow heel-to-shin on right, unable to lift left leg off bed  Psychiatric:        Mood and Affect: Mood normal.    ED Results / Procedures / Treatments   Labs (all labs ordered are listed, but only abnormal results are displayed) Labs Reviewed  ETHANOL - Abnormal; Notable for the following components:      Result Value    Alcohol, Ethyl (B) 275 (*)    All other components within normal limits  APTT - Abnormal; Notable for the following components:   aPTT 23 (*)    All other components within normal limits  COMPREHENSIVE METABOLIC PANEL WITH GFR - Abnormal; Notable for the following components:   CO2 21 (*)    Glucose, Bld 124 (*)    Albumin  3.2 (*)    All other components within normal limits  I-STAT CHEM 8, ED - Abnormal; Notable for the  following components:   Glucose, Bld 120 (*)    Calcium , Ion 1.12 (*)    All other components within normal limits  CBG MONITORING, ED - Abnormal; Notable for the following components:   Glucose-Capillary 134 (*)    All other components within normal limits  I-STAT VENOUS BLOOD GAS, ED - Abnormal; Notable for the following components:   pCO2, Ven 34.9 (*)    pO2, Ven 61 (*)    Calcium , Ion 1.11 (*)    All other components within normal limits  PROTIME-INR  CBC  DIFFERENTIAL  HCG, SERUM, QUALITATIVE  RAPID URINE DRUG SCREEN, HOSP PERFORMED    EKG None  Radiology CT ANGIO HEAD NECK W WO CM W PERF (CODE STROKE) Result Date: 06/30/2023 CLINICAL DATA:  Left-sided weakness EXAM: CT ANGIOGRAPHY HEAD AND NECK CT PERFUSION BRAIN TECHNIQUE: Multidetector CT imaging of the head and neck was performed using the standard protocol during bolus administration of intravenous contrast. Multiplanar CT image reconstructions and MIPs were obtained to evaluate the vascular anatomy. Carotid stenosis measurements (when applicable) are obtained utilizing NASCET criteria, using the distal internal carotid diameter as the denominator. Multiphase CT imaging of the brain was performed following IV bolus contrast injection. Subsequent parametric perfusion maps were calculated using RAPID software. RADIATION DOSE REDUCTION: This exam was performed according to the departmental dose-optimization program which includes automated exposure control, adjustment of the mA and/or kV according to patient  size and/or use of iterative reconstruction technique. CONTRAST:  OMNIPAQUE  IOHEXOL  350 MG/ML SOLN COMPARISON:  None Available. FINDINGS: CTA NECK FINDINGS Skeleton: No acute abnormality or high grade bony spinal canal stenosis. Other neck: Normal pharynx, larynx and major salivary glands. No cervical lymphadenopathy. Unremarkable thyroid  gland. Upper chest: No pneumothorax or pleural effusion. No nodules or masses. Aortic arch: There is no calcific atherosclerosis of the aortic arch. Conventional 3 vessel aortic branching pattern. RIGHT carotid system: Normal without aneurysm, dissection or stenosis. LEFT carotid system: Normal without aneurysm, dissection or stenosis. Vertebral arteries: Codominant configuration. There is no dissection, occlusion or flow-limiting stenosis to the skull base (V1-V3 segments). CTA HEAD FINDINGS POSTERIOR CIRCULATION: Vertebral arteries are normal. No proximal occlusion of the anterior or inferior cerebellar arteries. Basilar artery is normal. Superior cerebellar arteries are normal. Posterior cerebral arteries are normal. ANTERIOR CIRCULATION: Intracranial internal carotid arteries are normal. Anterior cerebral arteries are normal. Middle cerebral arteries are normal. Venous sinuses: As permitted by contrast timing, patent. Anatomic variants: None Review of the MIP images confirms the above findings. CT Brain Perfusion Findings: ASPECTS: 10 CBF (<30%) Volume: 0mL Perfusion (Tmax>6.0s) volume: 0mL Mismatch Volume: 0mL Infarction Location:None IMPRESSION: 1. Normal CTA of the head and neck. 2. Normal CT perfusion. Electronically Signed   By: Juanetta Nordmann M.D.   On: 06/30/2023 21:06   CT HEAD CODE STROKE WO CONTRAST Result Date: 06/30/2023 CLINICAL DATA:  Code stroke.  Dysphagia and left-sided weakness EXAM: CT HEAD WITHOUT CONTRAST TECHNIQUE: Contiguous axial images were obtained from the base of the skull through the vertex without intravenous contrast. RADIATION DOSE  REDUCTION: This exam was performed according to the departmental dose-optimization program which includes automated exposure control, adjustment of the mA and/or kV according to patient size and/or use of iterative reconstruction technique. COMPARISON:  02/27/2023 FINDINGS: Brain: There is no mass, hemorrhage or extra-axial collection. The size and configuration of the ventricles and extra-axial CSF spaces are normal. The brain parenchyma is normal, without evidence of acute or chronic infarction. Vascular: No abnormal hyperdensity of the major  intracranial arteries or dural venous sinuses. No intracranial atherosclerosis. Skull: The visualized skull base, calvarium and extracranial soft tissues are normal. Sinuses/Orbits: No fluid levels or advanced mucosal thickening of the visualized paranasal sinuses. No mastoid or middle ear effusion. The orbits are normal. ASPECTS [facility] Stroke Program Early CT Score) - Ganglionic level infarction (caudate, lentiform nuclei, internal capsule, insula, M1-M3 cortex): 7 - Supraganglionic infarction (M4-M6 cortex): 3 Total score (0-10 with 10 being normal): 10 IMPRESSION: 1. No acute intracranial abnormality. 2. ASPECTS is 10. These results were communicated to Dr. Ann Keto at 8:42 pm on 06/30/2023 by text page via the [facility] messaging system. Electronically Signed   By: Juanetta Nordmann M.D.   On: 06/30/2023 20:43    Procedures Procedures    Medications Ordered in ED Medications  iohexol  (OMNIPAQUE ) 350 MG/ML injection 100 mL (100 mLs Intravenous Contrast Given 06/30/23 2049)  prochlorperazine  (COMPAZINE ) injection 10 mg (10 mg Intravenous Given 06/30/23 2110)  diphenhydrAMINE  (BENADRYL ) injection 12.5 mg (12.5 mg Intravenous Given 06/30/23 2110)  sodium chloride  0.9 % bolus 1,000 mL (0 mLs Intravenous Stopped 06/30/23 2316)  LORazepam  (ATIVAN ) injection 1 mg (1 mg Intravenous Given 06/30/23 2331)   ED Course/ Medical Decision Making/ A&P Clinical Course as of  06/30/23 2348  Tue Jun 30, 2023  2346 47YOF came in as code stroke with slurred speech and left sided weakness. Seen by neuro, inconsistent exam. Believed to maybe have complex migraine. Needs MRI, MTF. Admit for PT/OT if unable to walk and has left sided weakness.  [CG]    Clinical Course User Index [CG] Adel Aden, PA-C    48 year old here for evaluation of left-sided weakness and speech difficulty.  EMS initially notes unknown last known normal. After speaking with family ([place]) per EMS last known normal 1600.  Code stroke called.  Neurology, Dr. Alecia Ames in CT scanner.  Left-sided weakness, facial droop however now outside TNK window.  Patient does endorse headache and has history of migraines.,  Question complicated migraine.  Her CT head did not show any significant abnormality.  Recommends canceling code stroke and giving migraine cocktail.  If symptoms resolve after migraine cocktail does not need MRI.  Labs and imaging personally viewed and interpreted:  Metabolic panel glucose 124 CBC without leukocytosis Pregnancy test negative Ethanol 275 VBG no acidosis CT head wo acute process CTA wo acute process  Patient reassessed.  Speech mildly improved still has some weakness to the left side.  Does admit to drinking "some wine" today.  Patient reassessed. HA improved. Sleepy. Still with left weakness however improved. Will get MRI however exam inconsistent, question conversion disorder? See Neurology note.  Care transferred to [place], [place] who will FU on imaging, reevaluation and disposition.                                 Medical Decision Making Amount and/or Complexity of Data Reviewed Independent Historian: EMS External Data Reviewed: labs, radiology, ECG and notes. Labs: ordered. Decision-making details documented in ED Course. Radiology: ordered and independent interpretation performed. Decision-making details documented in ED Course.  Risk OTC  drugs. Prescription drug management. Parenteral controlled substances. Decision regarding hospitalization. Diagnosis or treatment significantly limited by social determinants of health.         Final Clinical Impression(s) / ED Diagnoses Final diagnoses:  Alcoholic intoxication without complication (HCC)  Acute nonintractable headache, unspecified headache type  Weakness    Rx / DC  Orders ED Discharge Orders     None         Hasina Kreager A, PA-C 06/30/23 2348    Kommor, Alyse July, MD 07/01/23 1209

## 2023-07-01 ENCOUNTER — Emergency Department (HOSPITAL_COMMUNITY)

## 2023-07-01 DIAGNOSIS — R519 Headache, unspecified: Secondary | ICD-10-CM | POA: Diagnosis not present

## 2023-07-01 NOTE — ED Provider Notes (Addendum)
  Physical Exam  BP 112/82   Pulse (!) 102   Temp 98.9 F (37.2 C) (Oral)   Resp 17   Ht 5\' 5"  (1.651 m)   Wt 95.3 kg   LMP 11/27/2012   SpO2 96%   BMI 34.95 kg/m   Physical Exam  Procedures  Procedures  ED Course / MDM      Medical Decision Making Amount and/or Complexity of Data Reviewed Labs: ordered. Radiology: ordered.  Risk Prescription drug management.   Patient signed out to me at shift change pending MRI result.  Please see the previous provider note for further detail.  In short, 48 year old female presents as code stroke with slurred speech and left-sided weakness.  She was seen by neurology, Dr. Alecia Ames, with an inconsistent exam.  Dr. Alecia Ames believe that this might be related to complex migraine.  Patient was signed out to me pending MRI, metabolization to freedom.  Update: Patient reassessed.  Patient without any focal neurodeficits.  Slurred speech still present however patient alcohol level 275.  Neurological examination reveals 5 out of 5 strength bilateral lower extremities.  5 out of 5 strength bilateral upper extremities.  Intact heel-to-shin.  No pronator drift.  At this time, patient feels comfortable going home.  Will discharge patient home.  Will have her follow-up with outpatient neurology as she states she is already established with them.  She was given return precautions and she voiced understanding.  Stable to discharge home.           Adel Aden, PA-C 07/01/23 0257    Rory Collard, MD 07/01/23 [id number]

## 2023-07-01 NOTE — ED Notes (Signed)
 Patient transported to MRI

## 2023-07-01 NOTE — Discharge Instructions (Addendum)
 It was a pleasure taking part in your care.  As discussed, your workup is reassuring.  We believe that you have a complex migraine that caused your presenting symptoms today.  Please follow-up with neurology.  Please continue taking all medications as prescribed.  Return to the ED with any new or worsening symptoms.

## 2023-07-07 ENCOUNTER — Ambulatory Visit: Attending: Critical Care Medicine

## 2023-07-07 VITALS — Ht 65.0 in | Wt 215.0 lb

## 2023-07-07 DIAGNOSIS — Z1239 Encounter for other screening for malignant neoplasm of breast: Secondary | ICD-10-CM

## 2023-07-07 DIAGNOSIS — Z0000 Encounter for general adult medical examination without abnormal findings: Secondary | ICD-10-CM

## 2023-07-07 DIAGNOSIS — Z0100 Encounter for examination of eyes and vision without abnormal findings: Secondary | ICD-10-CM

## 2023-07-07 NOTE — Patient Instructions (Signed)
 Marisa [name] , Thank you for taking time out of your busy schedule to complete your Annual Wellness Visit with me. I enjoyed our conversation and look forward to speaking with you again next year. I, as well as your care team,  appreciate your ongoing commitment to your health goals. Please review the following plan we discussed and let me know if I can assist you in the future. Your Game plan/ To Do List    Referrals: If you haven't heard from the office you've been referred to, please reach out to them at the phone provided.  [facility]   [address], [address]  [place], [place] [address]  [phone]  *[facility] [address] [place],  [place]  [address]  [phone]  Follow up Visits: Next Medicare AWV with our clinical staff: 07/12/2024 at 9:10 a.m. PHONE VISIT with Nurse Health Advisor   Have you seen your provider in the last 6 months (3 months if uncontrolled diabetes)? Yes Next Office Visit with your provider: Please schedule with Zelda, NP in 3 months.  Clinician Recommendations:  Aim for 30 minutes of exercise or brisk walking, 6-8 glasses of water , and 5 servings of fruits and vegetables each day.       This is a list of the screening recommended for you and due dates:  Health Maintenance  Topic Date Due   Eye exam for diabetics  01/23/2021   Complete foot exam   02/21/2022   Hemoglobin A1C  06/17/2023   Pneumococcal Vaccination (1 of 2 - PCV) 05/07/2024*   Flu Shot  09/18/2023   Yearly kidney health urinalysis for diabetes  12/18/2023   Yearly kidney function blood test for diabetes  06/29/2024   Medicare Annual Wellness Visit  07/06/2024   DTaP/Tdap/Td vaccine (2 - Td or Tdap) 04/09/2030   Colon Cancer Screening  01/21/2033   Hepatitis C Screening  Completed   HIV Screening  Completed   HPV Vaccine  Aged Out   Meningitis B Vaccine  Aged Out   COVID-19 Vaccine  Discontinued  *Topic was postponed. The date shown is not the  original due date.    Advanced directives: (Declined) Advance directive discussed with you today. Even though you declined this today, please call our office should you change your mind, and we can give you the proper paperwork for you to fill out. Advance Care Planning is important because it:  [x]  Makes sure you receive the medical care that is consistent with your values, goals, and preferences  [x]  It provides guidance to your family and loved ones and reduces their decisional burden about whether or not they are making the right decisions based on your wishes.  Follow the link provided in your after visit summary or read over the paperwork we have mailed to you to help you started getting your Advance Directives in place. If you need assistance in completing these, please reach out to us  so that we can help you!  See attachments for Preventive Care and Fall Prevention Tips.

## 2023-07-07 NOTE — Progress Notes (Signed)
 Because this visit was a virtual/telehealth visit,  certain criteria was not obtained, such a blood pressure, CBG if applicable, and timed get up and go. Any medications not marked as "taking" were not mentioned during the medication reconciliation part of the visit. Any vitals not documented were not able to be obtained due to this being a telehealth visit or patient was unable to self-report a recent blood pressure reading due to a lack of equipment at home via telehealth. Vitals that have been documented are verbally provided by the patient.   Subjective:   Marisa [name] is a 48 y.o. who presents for a Medicare Wellness preventive visit.  As a reminder, Annual Wellness Visits don't include a physical exam, and some assessments may be limited, especially if this visit is performed virtually. We may recommend an in-person follow-up visit with your provider if needed.  Visit Complete: Virtual I connected with  Marisa [name] on 07/07/23 by a audio enabled telemedicine application and verified that I am speaking with the correct person using two identifiers.  Patient Location: Home  Provider Location: Office/Clinic  I discussed the limitations of evaluation and management by telemedicine. The patient expressed understanding and agreed to proceed.  Vital Signs: Because this visit was a virtual/telehealth visit, some criteria may be missing or patient reported. Any vitals not documented were not able to be obtained and vitals that have been documented are patient reported.  VideoDeclined- This patient declined Librarian, academic. Therefore the visit was completed with audio only.  Persons Participating in Visit: Patient.  AWV Questionnaire: No: Patient Medicare AWV questionnaire was not completed prior to this visit.        Objective:     Today's Vitals   07/07/23 0913  Weight: 215 lb (97.5 kg)  Height: 5\' 5"  (1.651 m)  PainSc: 10-Worst  pain ever  PainLoc: Hand   Body mass index is 35.78 kg/m.     07/07/2023    9:16 AM 01/22/2023    7:34 AM 09/14/2022   11:10 PM 09/14/2022   11:07 PM 07/01/2022   12:02 PM 04/05/2021    8:55 AM 01/04/2021    8:18 PM  Advanced Directives  Does Patient Have a Medical Advance Directive? Yes No Yes Yes No No Yes  Type of Estate agent of [place];Living will   Healthcare Power of Attorney   Living will  Does patient want to make changes to medical advance directive?   No - Patient declined No - Patient declined Yes (MAU/Ambulatory/Procedural Areas - Information given)  No - Patient declined  Copy of Healthcare Power of Attorney in Chart? No - copy requested        Would patient like information on creating a medical advance directive?       No - Patient declined    Current Medications (verified) Outpatient Encounter Medications as of 07/07/2023  Medication Sig   Accu-Chek Softclix Lancets lancets Use as instructed   albuterol  (PROVENTIL  HFA) 108 (90 Base) MCG/ACT inhaler Inhale 2 puffs into the lungs every 6 (six) hours as needed for wheezing or shortness of breath.   albuterol  (PROVENTIL ) (2.5 MG/3ML) 0.083% nebulizer solution Take 3 mLs (2.5 mg total) by nebulization every 6 (six) hours as needed for wheezing or shortness of breath.   atorvastatin  (LIPITOR) 10 MG tablet Take 1 tablet (10 mg total) by mouth daily.   Blood Glucose Monitoring Suppl (ACCU-CHEK GUIDE) w/Device KIT Check blood sugars daily   busPIRone  (BUSPAR )  15 MG tablet Take 1 tablet (15 mg total) by mouth 2 (two) times daily.   dicyclomine  (BENTYL ) 10 MG capsule Take 1 capsule (10 mg total) by mouth 3 (three) times daily before meals.   DULoxetine  40 MG CPEP Take 1 capsule (40 mg total) by mouth daily.   EPINEPHrine  0.3 mg/0.3 mL IJ SOAJ injection Inject 0.3 mg into the muscle as needed for anaphylaxis.   gabapentin  (NEURONTIN ) 300 MG capsule Take 1 capsule (300 mg total) by mouth 2 (two) times daily.    glucose blood (ACCU-CHEK GUIDE TEST) test strip Use as instructed. Check blood glucose level by fingerstick 1-2 times per day. E11.65   HYDROcodone -acetaminophen  (NORCO/VICODIN) 5-325 MG tablet Take 1 tablet by mouth every 6 (six) hours as needed. (Patient not taking: Reported on 05/08/2023)   Multiple Vitamins-Minerals (BARIATRIC MULTIVITAMINS/IRON PO) Take 1 tablet by mouth daily.   ondansetron  (ZOFRAN ) 4 MG tablet Take 1 tablet (4 mg total) by mouth every 6 (six) hours as needed for nausea.   potassium chloride  SA (KLOR-CON  M) 20 MEQ tablet Take 2 tablets (40 mEq total) by mouth daily.   SUMAtriptan  (IMITREX ) 100 MG tablet Take 1 tablet at the start of the headache.   topiramate  (TOPAMAX ) 25 MG tablet TAKE 1 TABLET BY MOUTH AT BEDTIME TO  DECREASE  MIGRAINE  FREQUENCY   Vitamin D , Ergocalciferol , (DRISDOL ) 1.25 MG (50000 UNIT) CAPS capsule Take 1 capsule (50,000 Units total) by mouth every 7 (seven) days.   No facility-administered encounter medications on file as of 07/07/2023.    Allergies (verified) Asa [aspirin], Mushroom extract complex (obsolete), Penicillins, Shellfish allergy, Triamcinolone , and Zoloft  [email or web address] ]   History: Past Medical History:  Diagnosis Date   Abdominal pain 04/26/2013   Abnormal uterine bleeding (AUB) 10/11/2012   Acute bronchitis    Allergy    Anal pain    chronic   Anemia    Anxiety    Asthma    exacerbation 02-28-2014 and 02-23-2014 secondary to Rhinovirus   Atypical chest pain 04/26/2013   Benign neoplasm of sigmoid colon    Benign neoplasm of transverse colon    Carbuncle of labium 07/12/2015   Chest pain 02/20/2014   Chronic diarrhea    Chronic headaches    Chronic low back pain    Cigarette nicotine dependence without complication 05/04/2014   Cyst of right ovary    Dandruff 03/26/2015   Diabetes mellitus without complication (HCC) 10/13/2013   Diabetic neuropathy, painful (HCC) 12/13/2019   Difficult intravenous access    PER PT NEEDS  PICC LINE   Dyspnea 09/07/2012   Spiro 08/2012:  No obstruction by FEV1%, but probable restriction.     Falls 03/27/2014   Food allergy    Mushrooms, shellfish   GAD (generalized anxiety disorder) 05/04/2014   Gait disturbance 04/11/2014   Gait instability    GERD (gastroesophageal reflux disease)    History of adenomatous polyp of colon    History of cardiac arrest    during SVD 1992   History of ectopic pregnancy    2009-  S/P LEFT SALPINGECTOMY   History of panic attacks    Hyperlipidemia    IBS (irritable bowel syndrome)    Insomnia 03/06/2014   Joint pain    Lower extremity edema    Lumbar stenosis L4 -- L5 with bulging disk   w/ right leg weakness/ decreased mobility   Migraine variant with headache 05/09/2014   Mild obstructive sleep apnea    study 03-20-2014  no  cpap recommended   Nausea and vomiting 09/16/2022   Neuromuscular disorder (HCC)    neuropathy in feet    Neuropathic pain of both legs 06/04/2016   Obesity (BMI 30-39.9) 03/05/2018   OSA (obstructive sleep apnea) 03/06/2014   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   Panniculitis 03/05/2018   Pelvic pain 07/25/2013   Persistent vomiting 04/27/2013   Pneumonia    PTSD (post-traumatic stress disorder) 05/04/2014   Rash and nonspecific skin eruption 07/12/2015   Rectal bleeding 07/25/2013   RLQ abdominal pain    S/P Total vaginal hysterectomy on 01/06/13 01/06/2013   Sleep apnea    mild no cpap   Social anxiety disorder 05/04/2014   Sore throat 02/20/2014   Stomach ulcer    Tachycardia 02/20/2014   Type 2 diabetes mellitus (HCC)    Weakness of right leg    FROM BACK PROBLEM PER PT   Past Surgical History:  Procedure Laterality Date   ABDOMINAL HYSTERECTOMY     partial   BIOPSY  01/22/2023   Procedure: BIOPSY;  Surgeon: Albertina Hugger, MD;  Location: [facility];  Service: Gastroenterology;;   COLONOSCOPY Left 04/29/2013   Procedure: COLONOSCOPY;  Surgeon: Evangeline Hilts, MD;  Location: [facility];  Service: Endoscopy;  Laterality: Left;   COLONOSCOPY     COLONOSCOPY WITH PROPOFOL  N/A 08/01/2016   Procedure: COLONOSCOPY WITH PROPOFOL ;  Surgeon: Albertina Hugger, MD;  Location: [facility];  Service: Gastroenterology;  Laterality: N/A;   COLONOSCOPY WITH PROPOFOL  N/A 01/22/2023   Procedure: COLONOSCOPY WITH PROPOFOL ;  Surgeon: Albertina Hugger, MD;  Location: [facility];  Service: Gastroenterology;  Laterality: N/A;   ECTOPIC PREGNANCY SURGERY     ESOPHAGOGASTRODUODENOSCOPY (EGD) WITH PROPOFOL  N/A 11/10/2018   Procedure: ESOPHAGOGASTRODUODENOSCOPY (EGD) WITH PROPOFOL ;  Surgeon: Albertina Hugger, MD;  Location: [facility];  Service: Gastroenterology;  Laterality: N/A;   EVALUATION UNDER ANESTHESIA WITH FISTULECTOMY N/A 04/20/2014   Procedure: EXAM UNDER ANESTHESIA ;  Surgeon: Joyce Nixon, MD;  Location: [facility];  Service: General;  Laterality: N/A;   FLEXIBLE SIGMOIDOSCOPY N/A 11/09/2013   Procedure: FLEXIBLE SIGMOIDOSCOPY;  Surgeon: Evangeline Hilts, MD;  Location: [facility];  Service: Endoscopy;  Laterality: N/A;   fupa removal      LAPAROSCOPIC CHOLECYSTECTOMY  2005   LAPAROSCOPIC GASTRIC SLEEVE RESECTION N/A 12/24/2020   Procedure: LAPAROSCOPIC GASTRIC SLEEVE RESECTION;  Surgeon: Adalberto Acton, MD;  Location: [facility];  Service: General;  Laterality: N/A;   REFRACTIVE SURGERY     SPHINCTEROTOMY N/A 04/20/2014   Procedure:  LATERAL INTERNAL SPHINCTEROTOMY;  Surgeon: Joyce Nixon, MD;  [facility];  Service: General;  Laterality: N/A;   TRANSTHORACIC ECHOCARDIOGRAM  12/30/2012   mild LVH/  ef 55-60%   UNILATERAL SALPINGECTOMY  2009   laparotomy left salpingectomy-- ectopic preg.   UPPER GASTROINTESTINAL ENDOSCOPY     UPPER GI ENDOSCOPY N/A 12/24/2020   Procedure: UPPER GI ENDOSCOPY;  Surgeon: Adalberto Acton, MD;  Location: [facility];  Service: General;  Laterality: N/A;   VAGINAL HYSTERECTOMY N/A 01/06/2013    Procedure: HYSTERECTOMY VAGINAL;  Surgeon: Julianne Octave, MD;  Location: [facility];  Service: Gynecology;  Laterality: N/A;   Family History  Problem Relation Age of Onset   Hypertension Mother    Diabetes Mother    Allergies Mother    Heart disease Mother    Clotting disorder Mother    Stroke Mother    Kidney disease Mother  Thyroid  disease Mother    Cancer Father    Hyperlipidemia Father    Hypertension Father    Colon cancer Father    Liver disease Father    Heart disease Maternal Grandmother    Breast cancer Maternal Grandmother 1   Schizophrenia Sister    Bipolar disorder Sister    Clotting disorder Sister    Bipolar disorder Brother    Kidney disease Brother    Bipolar disorder Sister    Pancreatic cancer Maternal Aunt    Prostate cancer Maternal Uncle    Liver cancer Maternal Grandfather    Rectal cancer Maternal Grandfather    Liver cancer Paternal Grandfather    Colon polyps Neg Hx    Esophageal cancer Neg Hx    Stomach cancer Neg Hx    Social History   Socioeconomic History   Marital status: Married    Spouse name: Not on file   Number of children: 1   Years of education: Not on file   Highest education level: Not on file  Occupational History   Occupation: disability  Tobacco Use   Smoking status: Former    Current packs/day: 0.00    Average packs/day: 0.2 packs/day for 11.0 years (2.2 ttl pk-yrs)    Types: Cigarettes    Start date: 11/18/2002    Quit date: 11/17/2013    Years since quitting: 9.6   Smokeless tobacco: Never   Tobacco comments:    2 years quit  Vaping Use   Vaping status: Never Used  Substance and Sexual Activity   Alcohol use: No    Alcohol/week: 0.0 standard drinks of alcohol   Drug use: No   Sexual activity: Yes    Birth control/protection: Surgical  Other Topics Concern   Not on file  Social History Narrative   Lives with sister   Drinks no caffeine   Right Handed    Lives in a one story home    Social Drivers of  Health   Financial Resource Strain: Low Risk  (07/01/2022)   Overall Financial Resource Strain (CARDIA)    Difficulty of Paying Living Expenses: Not hard at all  Food Insecurity: No Food Insecurity (09/19/2022)   Hunger Vital Sign    Worried About Running Out of Food in the Last Year: Never true    Ran Out of Food in the Last Year: Never true  Transportation Needs: No Transportation Needs (09/19/2022)   PRAPARE - Administrator, Civil Service (Medical): No    Lack of Transportation (Non-Medical): No  Physical Activity: Inactive (07/01/2022)   Exercise Vital Sign    Days of Exercise per Week: 0 days    Minutes of Exercise per Session: 0 min  Stress: No Stress Concern Present (07/01/2022)   [employer] of Occupational Health - Occupational Stress Questionnaire    Feeling of Stress : Not at all  Social Connections: Moderately Isolated (07/01/2022)   Social Connection and Isolation Panel [NHANES]    Frequency of Communication with Friends and Family: More than three times a week    Frequency of Social Gatherings with Friends and Family: Three times a week    Attends Religious Services: Never    Active Member of Clubs or Organizations: No    Attends Banker Meetings: Never    Marital Status: Married    Tobacco Counseling Counseling given: Not Answered Tobacco comments: 2 years quit    Clinical Intake:  Pre-visit preparation completed: Yes  Pain : 0-10 Pain  Score: 10-Worst pain ever Pain Type: Chronic pain, Neuropathic pain     Nutritional Risks: None Diabetes: Yes CBG done?: No Did pt. bring in CBG monitor from home?: No  Lab Results  Component Value Date   HGBA1C 5.5 12/18/2022   HGBA1C 6.1 (H) 09/04/2022   HGBA1C 5.9 02/21/2021     How often do you need to have someone help you when you read instructions, pamphlets, or other written materials from your doctor or pharmacy?: 1 - Never  Interpreter Needed?: No  Information entered by ::  Druscilla Gerhard, LPN.   Activities of Daily Living     09/14/2022   11:12 PM 09/14/2022   11:10 PM  In your present state of health, do you have any difficulty performing the following activities:  Hearing?  0  Vision?  1  Difficulty concentrating or making decisions?  0  Walking or climbing stairs?  0  Dressing or bathing?  0  Doing errands, shopping? 0     Patient Care Team: Pcp, No as PCP - General Patel, Donika K, DO as Consulting Physician (Neurology) Danis, Cordelia Dessert, MD as Consulting Physician (Gastroenterology)  Indicate any recent Medical Services you may have received from other than [facility] providers in the past year (date may be approximate).     Assessment:    This is a routine wellness examination for Karmon.  Hearing/Vision screen Hearing Screening - Comments:: Denies hearing difficulties.  Vision Screening - Comments:: Wears rx glasses - up to date with routine eye exams with [facility] ([place])    Goals Addressed   None    Depression Screen     07/07/2023    9:19 AM 05/08/2023    8:59 AM 12/18/2022    2:01 PM 09/04/2022   10:08 AM 07/01/2022   12:00 PM 02/21/2021   11:15 AM 01/02/2021   10:58 AM  PHQ 2/9 Scores  PHQ - 2 Score 0 3 4 2  0 2 0  PHQ- 9 Score 3 11 13 7  7 3     Fall Risk     07/07/2023    9:16 AM 05/08/2023    8:59 AM 09/04/2022   10:07 AM 07/01/2022    9:55 AM 04/05/2021    8:55 AM  Fall Risk   Falls in the past year? 1 1 0 1 1  Number falls in past yr: 1 1 0 1 1  Injury with Fall? 0 1 0 0 0  Comment  right hip soreness     Risk for fall due to : History of fall(s);Impaired balance/gait;Orthopedic patient No Fall Risks No Fall Risks History of fall(s)   Follow up Falls prevention discussed;Falls evaluation completed Falls evaluation completed Falls evaluation completed Education provided;Falls prevention discussed;Falls evaluation completed     MEDICARE RISK AT HOME:  Medicare Risk at Home Any stairs in or around the  home?: No If so, are there any without handrails?: No Home free of loose throw rugs in walkways, pet beds, electrical cords, etc?: Yes Adequate lighting in your home to reduce risk of falls?: Yes Life alert?: No Use of a cane, walker or w/c?: No Grab bars in the bathroom?: No Shower chair or bench in shower?: No Elevated toilet seat or a handicapped toilet?: No  TIMED UP AND GO:  Was the test performed?  No  Cognitive Function: 6CIT completed    07/07/2023    9:20 AM  MMSE - Mini Mental State Exam  Not completed: Unable to complete  07/07/2023    9:21 AM 07/01/2022   12:02 PM  6CIT Screen  What Year? 0 points 0 points  What month? 0 points 0 points  What time? 0 points 0 points  Count back from 20 0 points 0 points  Months in reverse 0 points 0 points  Repeat phrase 0 points 0 points  Total Score 0 points 0 points    Immunizations Immunization History  Administered Date(s) Administered   Influenza,inj,Quad PF,6+ Mos 02/07/2013, 03/02/2014   Moderna Sars-Covid-2 Vaccination 07/18/2019, 08/15/2019   PPD Test 12/20/2012   Tdap 04/09/2020    Screening Tests Health Maintenance  Topic Date Due   OPHTHALMOLOGY EXAM  01/23/2021   FOOT EXAM  02/21/2022   HEMOGLOBIN A1C  06/17/2023   Pneumococcal Vaccine 65-71 Years old (1 of 2 - PCV) 05/07/2024 (Originally 08/04/1993)   INFLUENZA VACCINE  09/18/2023   Diabetic kidney evaluation - Urine ACR  12/18/2023   Diabetic kidney evaluation - eGFR measurement  06/29/2024   Medicare Annual Wellness (AWV)  07/06/2024   DTaP/Tdap/Td (2 - Td or Tdap) 04/09/2030   Colonoscopy  01/21/2033   Hepatitis C Screening  Completed   HIV Screening  Completed   HPV VACCINES  Aged Out   Meningococcal B Vaccine  Aged Out   COVID-19 Vaccine  Discontinued    Health Maintenance  Health Maintenance Due  Topic Date Due   OPHTHALMOLOGY EXAM  01/23/2021   FOOT EXAM  02/21/2022   HEMOGLOBIN A1C  06/17/2023   Health Maintenance Items  Addressed: Yes Mammogram ordered, Referral sent to Optometry/Ophthalmology  Additional Screening:  Vision Screening: Recommended annual ophthalmology exams for early detection of glaucoma and other disorders of the eye.  Dental Screening: Recommended annual dental exams for proper oral hygiene  Community Resource Referral / Chronic Care Management: CRR required this visit?  No   CCM required this visit?  No   Plan:    I have personally reviewed and noted the following in the patient's chart:   Medical and social history Use of alcohol, tobacco or illicit drugs  Current medications and supplements including opioid prescriptions. Patient is currently taking opioid prescriptions. Information provided to patient regarding non-opioid alternatives. Patient advised to discuss non-opioid treatment plan with their provider. Functional ability and status Nutritional status Physical activity Advanced directives List of other physicians Hospitalizations, surgeries, and ER visits in previous 12 months Vitals Screenings to include cognitive, depression, and falls Referrals and appointments  In addition, I have reviewed and discussed with patient certain preventive protocols, quality metrics, and best practice recommendations. A written personalized care plan for preventive services as well as general preventive health recommendations were provided to patient.   Margette Sheldon, LPN   11/01/7827   After Visit Summary: (MyChart) Due to this being a telephonic visit, the after visit summary with patients personalized plan was offered to patient via MyChart   Notes: Mammogram ordered, Referral sent to Optometry/Ophthalmology for diabetic eye exam.

## 2023-07-16 ENCOUNTER — Telehealth: Payer: Self-pay | Admitting: Neurology
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
## 2023-07-16 NOTE — Telephone Encounter (Signed)
 Called patient and left a message for a call back.

## 2023-07-17 NOTE — Telephone Encounter (Signed)
 Patient scheduled for a follow up 6/2

## 2023-07-20 ENCOUNTER — Encounter: Payer: Self-pay | Admitting: Neurology

## 2023-07-20 ENCOUNTER — Ambulatory Visit (INDEPENDENT_AMBULATORY_CARE_PROVIDER_SITE_OTHER): Admitting: Neurology

## 2023-07-20 VITALS — BP 127/85 | HR 106 | Ht 65.0 in | Wt 229.0 lb

## 2023-07-20 DIAGNOSIS — R292 Abnormal reflex: Secondary | ICD-10-CM

## 2023-07-20 DIAGNOSIS — M542 Cervicalgia: Secondary | ICD-10-CM

## 2023-07-20 DIAGNOSIS — R202 Paresthesia of skin: Secondary | ICD-10-CM

## 2023-07-20 NOTE — Progress Notes (Signed)
 Follow-up Visit   Date: 07/20/2023    Marisa [name] MRN: [id number] DOB: 07-Sep-1975    Marisa [name] is a 48 y.o. right-handed female with diabetes mellitus, GERD, and asthma returning to the clinic for follow-up of bilateral arm pain.  The patient was accompanied to the clinic by self.   IMPRESSION/PLAN: Bilateral upper extremity pain and numbness, worse in the hands, with associated cervicalgia.  Her exam shows brisk reflexes throughout with distal weakness (?pain-limiting), and hyperesthesia to pin prick and temperature in the hands.  To further evaluate her symptoms, MRI cervical spine wo contrast will be ordered to assess for compressive pathology.  If imaging is nondiagnostic, NCS/EMG of bilateral arms will be the next step.   Further recommendations pending results.  ---------------------------------------------  UPDATE 07/20/2023:  She is here for acute visit due to progressively worsening bilateral hand pain and numbness.  For the past several years, she has numbness and pain in the right hand; however, symptoms have intensified over the past few months and also started to involve her left hand.  She reports having throbbing and episodic sharp pain involving the hands, and joints.  She also complains of numbness involving the hands and burned her right index finger because she did not know that he was on a hot surface.   She is unable to do her daughter's hair, opening jars, or dressing herself.  She is taking gabapentin  800mg  three times daily and OTC roll-on creams, which does not provide any relief.  She endorses neck pain and has fallen three times over the past month.  She was in the ER for one of these falls where MRI brain and CTA head and neck was normal.     Medications:  Current Outpatient Medications on File Prior to Visit  Medication Sig Dispense Refill   Accu-Chek Softclix Lancets lancets Use as instructed 100 each 12   albuterol  (PROVENTIL  HFA) 108  (90 Base) MCG/ACT inhaler Inhale 2 puffs into the lungs every 6 (six) hours as needed for wheezing or shortness of breath. 6.7 g 2   albuterol  (PROVENTIL ) (2.5 MG/3ML) 0.083% nebulizer solution Take 3 mLs (2.5 mg total) by nebulization every 6 (six) hours as needed for wheezing or shortness of breath. 150 mL 1   atorvastatin  (LIPITOR) 10 MG tablet Take 1 tablet (10 mg total) by mouth daily. 90 tablet 1   Blood Glucose Monitoring Suppl (ACCU-CHEK GUIDE) w/Device KIT Check blood sugars daily 1 kit 0   busPIRone  (BUSPAR ) 15 MG tablet Take 1 tablet (15 mg total) by mouth 2 (two) times daily. 60 tablet 3   dicyclomine  (BENTYL ) 10 MG capsule Take 1 capsule (10 mg total) by mouth 3 (three) times daily before meals. 90 capsule 1   DULoxetine  40 MG CPEP Take 1 capsule (40 mg total) by mouth daily. 90 capsule 1   EPINEPHrine  0.3 mg/0.3 mL IJ SOAJ injection Inject 0.3 mg into the muscle as needed for anaphylaxis. 1 each 1   gabapentin  (NEURONTIN ) 300 MG capsule Take 1 capsule (300 mg total) by mouth 2 (two) times daily. 180 capsule 1   glucose blood (ACCU-CHEK GUIDE TEST) test strip Use as instructed. Check blood glucose level by fingerstick 1-2 times per day. E11.65 100 each 12   Multiple Vitamins-Minerals (BARIATRIC MULTIVITAMINS/IRON PO) Take 1 tablet by mouth daily.     ondansetron  (ZOFRAN ) 4 MG tablet Take 1 tablet (4 mg total) by mouth every 6 (six) hours as needed for nausea. 20 tablet  0   potassium chloride  SA (KLOR-CON  M) 20 MEQ tablet Take 2 tablets (40 mEq total) by mouth daily. 60 tablet 1   SUMAtriptan  (IMITREX ) 100 MG tablet Take 1 tablet at the start of the headache. 12 tablet 3   topiramate  (TOPAMAX ) 25 MG tablet TAKE 1 TABLET BY MOUTH AT BEDTIME TO  DECREASE  MIGRAINE  FREQUENCY 90 tablet 0   Vitamin D , Ergocalciferol , (DRISDOL ) 1.25 MG (50000 UNIT) CAPS capsule Take 1 capsule (50,000 Units total) by mouth every 7 (seven) days. 12 capsule 1   No current facility-administered medications on  file prior to visit.    Allergies:  Allergies  Allergen Reactions   Asa [Aspirin] Anaphylaxis and Hives    Hives, chest tightness    Mushroom Extract Complex (Obsolete) Anaphylaxis, Swelling and Other (See Comments)    Reaction:  Eye swelling   Penicillins Anaphylaxis    Anaphylaxis ~48 yo necessitating ED visit (took pink thick liquid) - cannot recall taking any cephalosporins   Shellfish Allergy Anaphylaxis   Triamcinolone  Other (See Comments)    Skin issues    Zoloft  [Sertraline ]     Jittery feeling    Vital Signs:  BP 127/85   Pulse (!) 106   Ht 5\' 5"  (1.651 m)   Wt 229 lb (103.9 kg)   LMP 11/27/2012   SpO2 98%   BMI 38.11 kg/m   Neurological Exam: MENTAL STATUS including orientation to time, place, person, recent and remote memory, attention span and concentration, language, and fund of knowledge is normal.  Speech is not dysarthric.  CRANIAL NERVES:   Pupils equal round and reactive to light.  Normal conjugate, extra-ocular eye movements in all directions of gaze.  No ptosis.  Face is symmetric. Palate elevates symmetrically.  Tongue is midline.  MOTOR:  Motor strength is 5/5 in all extremities, except bilateral interosseus hand muscles are 4/5.  No atrophy, fasciculations or abnormal movements.  No pronator drift.  Tone is normal.    MSRs:  Reflexes are 3+/4 throughout, except 2+/4 at the ankles.  SENSORY:  Hyperesthesia to temperature and pin prick over the hands, vibration intact throughout.  Pin prick and temperature is normal in the legs.   COORDINATION/GAIT:  Normal finger-to- nose-finger.  Intact rapid alternating movements bilaterally.  Gait mildly wide-based and stable. Stressed gait intact. Unsteady with tandem gait.   Data: NCS/EMG of the right side 05/07/2021: This is a normal study of the right upper and lower extremities.  In particular, there is no evidence of a sensorimotor polyneuropathy, cervical/lumbosacral radiculopathy, or carpal tunnel  syndrome.  CTA head and neck 06/30/2023: 1. Normal CTA of the head and neck. 2. Normal CT perfusion.  MRI brain wo contrast 07/01/2023: 1. No acute intracranial abnormality. 2. Minimal multifocal hyperintense T2-weight signal within the white matter, nonspecific but may be seen in the setting of migraine headaches or early chronic small vessel ischemia.     Thank you for allowing me to participate in patient's care.  If I can answer any additional questions, I would be pleased to do so.    Sincerely,    Aneudy Champlain K. Lydia Sams, DO

## 2023-07-20 NOTE — Patient Instructions (Signed)
 MRI cervical spine  Nerve testing of the hands  ELECTROMYOGRAM AND NERVE CONDUCTION STUDIES (EMG/NCS) INSTRUCTIONS  How to Prepare The neurologist conducting the EMG will need to know if you have certain medical conditions. Tell the neurologist and other EMG lab personnel if you: Have a pacemaker or any other electrical medical device Take blood-thinning medications Have hemophilia, a blood-clotting disorder that causes prolonged bleeding Bathing Take a shower or bath shortly before your exam in order to remove oils from your skin. Don't apply lotions or creams before the exam.  What to Expect You'll likely be asked to change into a hospital gown for the procedure and lie down on an examination table. The following explanations can help you understand what will happen during the exam.  Electrodes. The neurologist or a technician places surface electrodes at various locations on your skin depending on where you're experiencing symptoms. Or the neurologist may insert needle electrodes at different sites depending on your symptoms.  Sensations. The electrodes will at times transmit a tiny electrical current that you may feel as a twinge or spasm. The needle electrode may cause discomfort or pain that usually ends shortly after the needle is removed. If you are concerned about discomfort or pain, you may want to talk to the neurologist about taking a short break during the exam.  Instructions. During the needle EMG, the neurologist will assess whether there is any spontaneous electrical activity when the muscle is at rest - activity that isn't present in healthy muscle tissue - and the degree of activity when you slightly contract the muscle.  He or she will give you instructions on resting and contracting a muscle at appropriate times. Depending on what muscles and nerves the neurologist is examining, he or she may ask you to change positions during the exam.  After your EMG You may experience some  temporary, minor bruising where the needle electrode was inserted into your muscle. This bruising should fade within several days. If it persists, contact your primary care doctor.

## 2023-07-21 ENCOUNTER — Ambulatory Visit: Payer: Self-pay | Admitting: *Deleted

## 2023-07-21 NOTE — Telephone Encounter (Signed)
 Patient was given an appointment with [facility] on 07/22/2023 at 0800.

## 2023-07-21 NOTE — Telephone Encounter (Signed)
 Call disconnected and NT called patient back .  Chief Complaint: lumps to bilateral breasts  Symptoms: noted 2 dark blemishes on right and left breasts this am. Lumps noted size of nickels. Sore to touch.  Has hx of boils on skin. Dizziness with standing at times. Pain stabbing and soreness to breasts  Frequency: this am  Pertinent Negatives: Patient denies fever no drainage not rash, no nipple discharge. Disposition: [] ED /[] Urgent Care (no appt availability in office) / [] Appointment(In office/virtual)/ []  [facility]/ [] Home Care/ [] Refused Recommended Disposition /[x] [facility]/ []  Follow-up with PCP Additional Notes:   No available appt today with any provider. Recommended mobile bus tomorrow and provider location and instructions.  Recommended if sx worsen go to UC/ED.  Please advise if any available appt noted today .    Copied from CRM [id number]. Topic: Clinical - Red Word Triage >> Jul 21, 2023 11:24 AM DeAngela L wrote: Red Word that prompted transfer to Nurse Triage: Patient calling she found 2 lumps on her breast on the right and the upper part of the left Reason for Disposition  [1] Breast looks infected (spreading redness, feels hot or painful to touch) AND [2] no fever  Answer Assessment - Initial Assessment Questions 1. SYMPTOM: "What's the main symptom you're concerned about?"  (e.g., lump, pain, rash, nipple discharge)     Lumps noted bilateral breasts size of nickels 2. LOCATION: "Where is the lumps  located?"     Right and left breasts 3. ONSET: "When did sx   start?"     Noted this am  4. PRIOR HISTORY: "Do you have any history of prior problems with your breasts?" (e.g., lumps, cancer, fibrocystic breast disease)     Yes boils under breast  5. CAUSE: "What do you think is causing this symptom?"     Not sure  6. OTHER SYMPTOMS: "Do you have any other symptoms?" (e.g., fever, breast pain, redness or rash, nipple discharge)     Breast pain  stabbing and soreness. dizziness with standing  7. PREGNANCY-BREASTFEEDING: "Is there any chance you are pregnant?" "When was your last menstrual period?" "Are you breastfeeding?"     na  Protocols used: Breast Symptoms-A-AH

## 2023-07-21 NOTE — Telephone Encounter (Signed)
 Noted

## 2023-07-22 ENCOUNTER — Other Ambulatory Visit: Payer: Self-pay | Admitting: Family

## 2023-07-22 ENCOUNTER — Ambulatory Visit: Payer: Self-pay | Admitting: Family

## 2023-07-22 ENCOUNTER — Ambulatory Visit (INDEPENDENT_AMBULATORY_CARE_PROVIDER_SITE_OTHER): Admitting: Family

## 2023-07-22 VITALS — BP 145/90 | HR 74 | Temp 98.8°F | Resp 16 | Ht 65.0 in | Wt 231.6 lb

## 2023-07-22 DIAGNOSIS — N644 Mastodynia: Secondary | ICD-10-CM

## 2023-07-22 DIAGNOSIS — Z1231 Encounter for screening mammogram for malignant neoplasm of breast: Secondary | ICD-10-CM

## 2023-07-22 DIAGNOSIS — E119 Type 2 diabetes mellitus without complications: Secondary | ICD-10-CM

## 2023-07-22 DIAGNOSIS — Z7689 Persons encountering health services in other specified circumstances: Secondary | ICD-10-CM

## 2023-07-22 DIAGNOSIS — E1165 Type 2 diabetes mellitus with hyperglycemia: Secondary | ICD-10-CM

## 2023-07-22 DIAGNOSIS — R030 Elevated blood-pressure reading, without diagnosis of hypertension: Secondary | ICD-10-CM

## 2023-07-22 DIAGNOSIS — F32A Depression, unspecified: Secondary | ICD-10-CM

## 2023-07-22 DIAGNOSIS — Z7985 Long-term (current) use of injectable non-insulin antidiabetic drugs: Secondary | ICD-10-CM | POA: Diagnosis not present

## 2023-07-22 DIAGNOSIS — Z13228 Encounter for screening for other metabolic disorders: Secondary | ICD-10-CM

## 2023-07-22 DIAGNOSIS — F418 Other specified anxiety disorders: Secondary | ICD-10-CM | POA: Diagnosis not present

## 2023-07-22 LAB — POCT GLYCOSYLATED HEMOGLOBIN (HGB A1C): Hemoglobin A1C: 6 % — AB (ref 4.0–5.6)

## 2023-07-22 MED ORDER — HYDROXYZINE PAMOATE 25 MG PO CAPS
25.0000 mg | ORAL_CAPSULE | Freq: Three times a day (TID) | ORAL | 2 refills | Status: DC | PRN
Start: 2023-07-22 — End: 2023-08-26

## 2023-07-22 MED ORDER — SEMAGLUTIDE(0.25 OR 0.5MG/DOS) 2 MG/3ML ~~LOC~~ SOPN
0.2500 mg | PEN_INJECTOR | SUBCUTANEOUS | 1 refills | Status: DC
Start: 2023-07-22 — End: 2024-01-11

## 2023-07-22 NOTE — Progress Notes (Signed)
 Patient ID: TAELA CHARBONNEAU, female    DOB: 1975-07-04  MRN: [id number]  CC: Establish Care  Subjective: Amilah Greenspan is a 48 y.o. female who presents for establish care.  Her concerns today include:  - Type 2 diabetes. States her previous primary provider took her off of Metformin  1 year ago. Report she is not currently taking any medication for diabetes. She watches what she eats. She exercises. States she is gaining weight and would like to try an Ozempic or Wegovy to see if this helps. She denies red flag symptoms associated with diabetes.  - Due for diabetic eye exam.  - Due for diabetic foot exam.  - Bilateral breast pain for several days. States breasts sore, throbbing, pulsing, and has "blemishes".  - States anxiety depression related to several family members passing away. States she is in grief counseling. She would like to try a medication to help. She denies thoughts of self-harm, suicidal ideations, homicidal ideations. - Blood pressure check. Reports no history of high blood pressure. She does not complain of red flag symptoms such as but not limited to chest pain, shortness of breath, worst headache of life, nausea/vomiting.  - No further issues/concerns for discussion today.  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   Morbid obesity (HCC) 12/24/2020   Allergic rhinitis 06/26/2020    Hyperlipidemia associated with type 2 diabetes mellitus (HCC) 04/10/2020   Chronic pain syndrome 04/09/2020   Type 2 diabetes mellitus with diabetic neuropathy, without long-term current use of insulin  (HCC) 04/09/2020   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 12/13/2019   Vitamin D  deficiency 04/05/2019   Class 2 severe obesity with serious comorbidity and body mass index (BMI) of 36.0 to 36.9 in adult (HCC) 04/05/2019   Obesity (BMI 30-39.9) 03/05/2018   Chronic diarrhea    Benign neoplasm of transverse colon    Benign neoplasm of sigmoid colon    Neuropathic pain of both legs 06/04/2016   Rash and nonspecific skin eruption 07/12/2015   Dandruff 03/26/2015   Migraine variant with headache 05/09/2014   GAD (generalized anxiety disorder) 05/04/2014   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   Social anxiety disorder 05/04/2014   PTSD (post-traumatic stress disorder) 05/04/2014   Depression 03/27/2014   OSA (obstructive sleep apnea) 03/06/2014   Insomnia 03/06/2014   History of cardiac arrest    Chest pain 02/20/2014   Rectal bleeding 07/25/2013   S/P Total vaginal hysterectomy on 01/06/13 01/06/2013   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 6.7 g 2   atorvastatin  (LIPITOR) 10 MG tablet Take 1 tablet (10 mg total)  by mouth daily. 90 tablet 1   Blood Glucose Monitoring Suppl (ACCU-CHEK GUIDE) w/Device KIT Check blood sugars daily 1 kit 0   busPIRone  (BUSPAR ) 15 MG tablet Take 1 tablet (15 mg total) by mouth 2 (two) times daily. 60 tablet 3   dicyclomine  (BENTYL ) 10 MG capsule Take 1 capsule (10 mg total) by mouth 3 (three) times daily before meals. 90 capsule 1    DULoxetine  40 MG CPEP Take 1 capsule (40 mg total) by mouth daily. 90 capsule 1   EPINEPHrine  0.3 mg/0.3 mL IJ SOAJ injection Inject 0.3 mg into the muscle as needed for anaphylaxis. 1 each 1   gabapentin  (NEURONTIN ) 300 MG capsule Take 1 capsule (300 mg total) by mouth 2 (two) times daily. 180 capsule 1   glucose blood (ACCU-CHEK GUIDE TEST) test strip Use as instructed. Check blood glucose level by fingerstick 1-2 times per day. E11.65 100 each 12   Multiple Vitamins-Minerals (BARIATRIC MULTIVITAMINS/IRON PO) Take 1 tablet by mouth daily.     ondansetron  (ZOFRAN ) 4 MG tablet Take 1 tablet (4 mg total) by mouth every 6 (six) hours as needed for nausea. 20 tablet 0   potassium chloride  SA (KLOR-CON  M) 20 MEQ tablet Take 2 tablets (40 mEq total) by mouth daily. 60 tablet 1   SUMAtriptan  (IMITREX ) 100 MG tablet Take 1 tablet at the start of the headache. 12 tablet 3   topiramate  (TOPAMAX ) 25 MG tablet TAKE 1 TABLET BY MOUTH AT BEDTIME TO  DECREASE  MIGRAINE  FREQUENCY 90 tablet 0   Vitamin D , Ergocalciferol , (DRISDOL ) 1.25 MG (50000 UNIT) CAPS capsule Take 1 capsule (50,000 Units total) by mouth every 7 (seven) days. 12 capsule 1   albuterol  (PROVENTIL ) (2.5 MG/3ML) 0.083% nebulizer solution Take 3 mLs (2.5 mg total) by nebulization every 6 (six) hours as needed for wheezing or shortness of breath. 150 mL 1   No current facility-administered medications on file prior to visit.    Allergies  Allergen Reactions   Asa [Aspirin] Anaphylaxis and Hives    Hives, chest tightness    Mushroom Extract Complex (Obsolete) Anaphylaxis, Swelling and Other (See Comments)    Reaction:  Eye swelling   Penicillins Anaphylaxis    Anaphylaxis ~48 yo necessitating ED visit (took pink thick liquid) - cannot recall taking any cephalosporins   Shellfish Allergy Anaphylaxis   Triamcinolone  Other (See Comments)    Skin issues    Zoloft  [Sertraline ]     Jittery feeling    Social History   Socioeconomic History    Marital status: Married    Spouse name: Not on file   Number of children: 1   Years of education: Not on file   Highest education level: Some college, no degree  Occupational History   Occupation: disability  Tobacco Use   Smoking status: Former    Current packs/day: 0.00    Average packs/day: 0.2 packs/day for 11.0 years (2.2 ttl pk-yrs)    Types: Cigarettes    Start date: 11/18/2002    Quit date: 11/17/2013    Years since quitting: 9.6   Smokeless tobacco: Never   Tobacco comments:    2 years quit  Vaping Use   Vaping status: Never Used  Substance and Sexual Activity   Alcohol use: No    Alcohol/week: 0.0 standard drinks of alcohol   Drug use: No   Sexual activity: Yes    Birth control/protection: Surgical  Other Topics Concern   Not on file  Social History Narrative  Lives with sister   Drinks no caffeine   Right Handed    Lives in a one story home    Social Drivers of Health   Financial Resource Strain: Low Risk  (07/22/2023)   Overall Financial Resource Strain (CARDIA)    Difficulty of Paying Living Expenses: Not hard at all  Food Insecurity: No Food Insecurity (07/22/2023)   Hunger Vital Sign    Worried About Running Out of Food in the Last Year: Never true    Ran Out of Food in the Last Year: Never true  Transportation Needs: No Transportation Needs (07/22/2023)   PRAPARE - Administrator, Civil Service (Medical): No    Lack of Transportation (Non-Medical): No  Physical Activity: Insufficiently Active (07/22/2023)   Exercise Vital Sign    Days of Exercise per Week: 2 days    Minutes of Exercise per Session: 40 min  Stress: Stress Concern Present (07/22/2023)   [employer] of Occupational Health - Occupational Stress Questionnaire    Feeling of Stress : Very much  Social Connections: Socially Integrated (07/22/2023)   Social Connection and Isolation Panel [NHANES]    Frequency of Communication with Friends and Family: More than three times a week     Frequency of Social Gatherings with Friends and Family: Twice a week    Attends Religious Services: More than 4 times per year    Active Member of [employer] or Organizations: Yes    Attends Banker Meetings: More than 4 times per year    Marital Status: Married  Catering manager Violence: Not At Risk (07/22/2023)   Humiliation, Afraid, Rape, and Kick questionnaire    Fear of Current or Ex-Partner: No    Emotionally Abused: No    Physically Abused: No    Sexually Abused: No    Family History  Problem Relation Age of Onset   Hypertension Mother    Diabetes Mother    Allergies Mother    Heart disease Mother    Clotting disorder Mother    Stroke Mother    Kidney disease Mother    Thyroid  disease Mother    Cancer Father    Hyperlipidemia Father    Hypertension Father    Colon cancer Father    Liver disease Father    Heart disease Maternal Grandmother    Breast cancer Maternal Grandmother 41   Schizophrenia Sister    Bipolar disorder Sister    Clotting disorder Sister    Bipolar disorder Brother    Kidney disease Brother    Bipolar disorder Sister    Pancreatic cancer Maternal Aunt    Prostate cancer Maternal Uncle    Liver cancer Maternal Grandfather    Rectal cancer Maternal Grandfather    Liver cancer Paternal Grandfather    Colon polyps Neg Hx    Esophageal cancer Neg Hx    Stomach cancer Neg Hx     Past Surgical History:  Procedure Laterality Date   ABDOMINAL HYSTERECTOMY     partial   BIOPSY  01/22/2023   Procedure: BIOPSY;  Surgeon: Albertina Hugger, MD;  Location: [facility];  Service: Gastroenterology;;   COLONOSCOPY Left 04/29/2013   Procedure: COLONOSCOPY;  Surgeon: Evangeline Hilts, MD;  Location: [facility];  Service: Endoscopy;  Laterality: Left;   COLONOSCOPY     COLONOSCOPY WITH PROPOFOL  N/A 08/01/2016   Procedure: COLONOSCOPY WITH PROPOFOL ;  Surgeon: Albertina Hugger, MD;  Location: [facility];  Service: Gastroenterology;  Laterality:  N/A;  COLONOSCOPY WITH PROPOFOL  N/A 01/22/2023   Procedure: COLONOSCOPY WITH PROPOFOL ;  Surgeon: Albertina Hugger, MD;  Location: [facility];  Service: Gastroenterology;  Laterality: N/A;   ECTOPIC PREGNANCY SURGERY     ESOPHAGOGASTRODUODENOSCOPY (EGD) WITH PROPOFOL  N/A 11/10/2018   Procedure: ESOPHAGOGASTRODUODENOSCOPY (EGD) WITH PROPOFOL ;  Surgeon: Albertina Hugger, MD;  Location: [facility];  Service: Gastroenterology;  Laterality: N/A;   EVALUATION UNDER ANESTHESIA WITH FISTULECTOMY N/A 04/20/2014   Procedure: EXAM UNDER ANESTHESIA ;  Surgeon: Joyce Nixon, MD;  Location: [facility];  Service: General;  Laterality: N/A;   FLEXIBLE SIGMOIDOSCOPY N/A 11/09/2013   Procedure: FLEXIBLE SIGMOIDOSCOPY;  Surgeon: Evangeline Hilts, MD;  Location: [facility];  Service: Endoscopy;  Laterality: N/A;   fupa removal      LAPAROSCOPIC CHOLECYSTECTOMY  2005   LAPAROSCOPIC GASTRIC SLEEVE RESECTION N/A 12/24/2020   Procedure: LAPAROSCOPIC GASTRIC SLEEVE RESECTION;  Surgeon: Adalberto Acton, MD;  Location: [facility];  Service: General;  Laterality: N/A;   REFRACTIVE SURGERY     SPHINCTEROTOMY N/A 04/20/2014   Procedure:  LATERAL INTERNAL SPHINCTEROTOMY;  Surgeon: Joyce Nixon, MD;  Location: [facility];  Service: General;  Laterality: N/A;   TRANSTHORACIC ECHOCARDIOGRAM  12/30/2012   mild LVH/  ef 55-60%   UNILATERAL SALPINGECTOMY  2009   laparotomy left salpingectomy-- ectopic preg.   UPPER GASTROINTESTINAL ENDOSCOPY     UPPER GI ENDOSCOPY N/A 12/24/2020   Procedure: UPPER GI ENDOSCOPY;  Surgeon: Adalberto Acton, MD;  Location: [facility];  Service: General;  Laterality: N/A;   VAGINAL HYSTERECTOMY N/A 01/06/2013   Procedure: HYSTERECTOMY VAGINAL;  Surgeon: Julianne Octave, MD;  Location: [facility];  Service: Gynecology;  Laterality: N/A;    ROS: Review of Systems Negative except as stated above  PHYSICAL EXAM: BP (!) 145/90   Pulse 74   Temp 98.8 F (37.1 C)  (Oral)   Resp 16   Ht 5\' 5"  (1.651 m)   Wt 231 lb 9.6 oz (105.1 kg)   LMP 11/27/2012   SpO2 93%   BMI 38.54 kg/m   Physical Exam HENT:     Head: Normocephalic and atraumatic.     Nose: Nose normal.     Mouth/Throat:     Mouth: Mucous membranes are moist.     Pharynx: Oropharynx is clear.  Eyes:     Extraocular Movements: Extraocular movements intact.     Conjunctiva/sclera: Conjunctivae normal.     Pupils: Pupils are equal, round, and reactive to light.  Cardiovascular:     Rate and Rhythm: Normal rate and regular rhythm.     Pulses: Normal pulses.     Heart sounds: Normal heart sounds.  Pulmonary:     Effort: Pulmonary effort is normal.     Breath sounds: Normal breath sounds.  Chest:  Breasts:    Right: Normal.     Left: Normal.     Comments: Hyperpigmented spots on bilateral breasts with no drainage or additional presentation. Alona Jamaica, CMA present. Musculoskeletal:        General: Normal range of motion.     Cervical back: Normal range of motion and neck supple.  Neurological:     General: No focal deficit present.     Mental Status: She is alert and oriented to person, place, and time.  Psychiatric:        Mood and Affect: Mood normal.        Behavior: Behavior normal.     Results for orders  placed or performed in visit on 07/22/23  HgB A1c  Result Value Ref Range   Hemoglobin A1C 6.0 (A) 4.0 - 5.6 %   HbA1c POC (<> result, manual entry)     HbA1c, POC (prediabetic range)     HbA1c, POC (controlled diabetic range)      ASSESSMENT AND PLAN: 1. Encounter to establish care (Primary) - Patient presents today to establish care. During the interim follow-up with primary provider as scheduled.  - Return for annual physical examination, labs, and health maintenance. Arrive fasting meaning having no food for at least 8 hours prior to appointment. You may have only water  or black coffee. Please take scheduled medications as normal.  2. Type 2 diabetes  mellitus with hyperglycemia, without long-term current use of insulin  (HCC) - Hemoglobin A1c at goal at 6.0%, goal 7%. - Trial Semaglutide  as prescribed.  - Routine screening.  - Discussed the importance of healthy eating habits, low-carbohydrate diet, low-sugar diet, regular aerobic exercise (at least 150 minutes a week as tolerated) and medication compliance to achieve or maintain control of diabetes. Counseled on medication adherence/adverse effects.  - Follow-up with primary provider in 4 weeks or sooner if needed. - HgB A1c - Basic Metabolic Panel - TSH - Semaglutide ,0.25 or 0.5MG /DOS, 2 MG/3ML SOPN; Inject 0.25 mg into the skin once a week.  Dispense: 6 mL; Refill: 1  3. Diabetic eye exam [facility]) - Referral to Ophthalmology for evaluation/management. - Ambulatory referral to Ophthalmology  4. Encounter for diabetic foot exam [facility]) - Referral to Podiatry for evaluation/management. - Ambulatory referral to Podiatry  5. Encounter for screening mammogram for malignant neoplasm of breast 6. Breast pain - Routine screening.  - MM Digital Screening; Future - MM Digital Diagnostic Bilat; Future - US  BREAST COMPLETE UNI RIGHT INC AXILLA; Future - US  BREAST COMPLETE UNI LEFT INC AXILLA; Future  7. Anxiety and depression - Patient denies thoughts of self-harm, suicidal ideations, homicidal ideations. - Trial Hydroxyzine  as prescribed. Counseled on medication adherence/adverse effects.  - Patient intolerant to Sertraline  and related. - Keep all scheduled appointments with grief counseling.  - Follow-up with primary provider in 4 weeks or sooner if needed. - hydrOXYzine  (VISTARIL ) 25 MG capsule; Take 1 capsule (25 mg total) by mouth every 8 (eight) hours as needed.  Dispense: 30 capsule; Refill: 2  8. Elevated blood pressure reading - Blood pressure not at goal during today's visit. Patient asymptomatic without chest pressure, chest pain, palpitations, shortness of breath, worst headache  of life, and any additional red flag symptoms. - Follow-up with primary provider in 4 weeks or sooner if needed.     Patient was given the opportunity to ask questions.  Patient verbalized understanding of the plan and was able to repeat key elements of the plan. Patient was given clear instructions to go to Emergency Department or return to medical center if symptoms don't improve, worsen, or new problems develop.The patient verbalized understanding.   Orders Placed This Encounter  Procedures   MM Digital Screening   MM Digital Diagnostic Bilat   US  BREAST COMPLETE UNI RIGHT INC AXILLA   US  BREAST COMPLETE UNI LEFT INC AXILLA   Basic Metabolic Panel   TSH   Ambulatory referral to Ophthalmology   Ambulatory referral to Podiatry   HgB A1c     Requested Prescriptions   Signed Prescriptions Disp Refills   Semaglutide ,0.25 or 0.5MG /DOS, 2 MG/3ML SOPN 6 mL 1    Sig: Inject 0.25 mg into the skin once a  week.   hydrOXYzine  (VISTARIL ) 25 MG capsule 30 capsule 2    Sig: Take 1 capsule (25 mg total) by mouth every 8 (eight) hours as needed.    Return in about 4 weeks (around 08/19/2023) for Follow-Up or next available chronic conditions.  Senaida Dama, NP

## 2023-07-22 NOTE — Progress Notes (Signed)
 Breast pain and irritation for days.  Patient has some lumps and blemishes.  Patient score a 11 on her PHQ-9 and a 10 on her GAD7

## 2023-07-23 LAB — BASIC METABOLIC PANEL WITH GFR
BUN/Creatinine Ratio: 26 — ABNORMAL HIGH (ref 9–23)
BUN: 14 mg/dL (ref 6–24)
CO2: 20 mmol/L (ref 20–29)
Calcium: 9 mg/dL (ref 8.7–10.2)
Chloride: 106 mmol/L (ref 96–106)
Creatinine, Ser: 0.54 mg/dL — ABNORMAL LOW (ref 0.57–1.00)
Glucose: 100 mg/dL — ABNORMAL HIGH (ref 70–99)
Potassium: 4.4 mmol/L (ref 3.5–5.2)
Sodium: 141 mmol/L (ref 134–144)
eGFR: 113 mL/min/{1.73_m2} (ref >59)

## 2023-07-23 LAB — TSH: TSH: 1.08 u[IU]/mL (ref 0.450–4.500)

## 2023-07-27 ENCOUNTER — Other Ambulatory Visit

## 2023-07-27 DIAGNOSIS — Z13228 Encounter for screening for other metabolic disorders: Secondary | ICD-10-CM

## 2023-07-28 ENCOUNTER — Ambulatory Visit: Payer: Self-pay | Admitting: Family

## 2023-07-28 LAB — HEPATIC FUNCTION PANEL
ALT: 18 IU/L (ref 0–32)
AST: 17 IU/L (ref 0–40)
Albumin: 4.1 g/dL (ref 3.9–4.9)
Alkaline Phosphatase: 86 IU/L (ref 44–121)
Bilirubin Total: 0.4 mg/dL (ref 0.0–1.2)
Bilirubin, Direct: 0.16 mg/dL (ref 0.00–0.40)
Total Protein: 6.9 g/dL (ref 6.0–8.5)

## 2023-07-29 NOTE — Telephone Encounter (Signed)
 Call place to patient husband due to phone being picked up by a [employer] employee. Patient husband was ask to have Ms. Marisa [name] call office.

## 2023-07-30 ENCOUNTER — Encounter (HOSPITAL_COMMUNITY): Payer: Self-pay | Admitting: *Deleted

## 2023-08-16 ENCOUNTER — Other Ambulatory Visit: Payer: Self-pay | Admitting: Internal Medicine

## 2023-08-18 ENCOUNTER — Ambulatory Visit: Admitting: Neurology

## 2023-08-18 ENCOUNTER — Ambulatory Visit
Admission: RE | Admit: 2023-08-18 | Discharge: 2023-08-18 | Disposition: A | Discharge status: Home / Self Care | Source: Ambulatory Visit | Attending: Neurology | Admitting: Neurology

## 2023-08-18 DIAGNOSIS — R202 Paresthesia of skin: Secondary | ICD-10-CM

## 2023-08-18 DIAGNOSIS — M542 Cervicalgia: Secondary | ICD-10-CM

## 2023-08-18 DIAGNOSIS — R292 Abnormal reflex: Secondary | ICD-10-CM

## 2023-08-18 NOTE — Telephone Encounter (Signed)
 Requested Prescriptions  Pending Prescriptions Disp Refills   atorvastatin  (LIPITOR) 10 MG tablet [Pharmacy Med Name: Atorvastatin  Calcium  10 MG Oral Tablet] 90 tablet 0    Sig: Take 1 tablet by mouth once daily     Cardiovascular:  Antilipid - Statins Failed - 08/18/2023  2:02 PM      Failed - Lipid Panel in normal range within the last 12 months    Cholesterol, Total  Date Value Ref Range Status  12/18/2022 194 100 - 199 mg/dL Final   LDL Chol Calc (NIH)  Date Value Ref Range Status  12/18/2022 117 (H) 0 - 99 mg/dL Final   HDL  Date Value Ref Range Status  12/18/2022 51 >39 mg/dL Final   Triglycerides  Date Value Ref Range Status  12/18/2022 150 (H) 0 - 149 mg/dL Final         Passed - Patient is not pregnant      Passed - Valid encounter within last 12 months    Recent Outpatient Visits           3 weeks ago Encounter to establish care   [facility], Amy J, NP   3 months ago Cervical radiculopathy   [facility] - A Dept Of [place]. [facility] Theotis Haze ORN, NP   8 months ago Diabetes mellitus type 2, diet-controlled [facility])   [facility] Of [place]. [facility] Vicci Sober B, MD   11 months ago Type 2 diabetes mellitus with diabetic neuropathy, without long-term current use of insulin  [facility])   [facility] Dept Of [place]. [facility] Brien Belvie BRAVO, MD   2 years ago Migraine variant with headache   [facility] - A Dept Of . [facility] Theotis Haze ORN, [place]

## 2023-08-24 ENCOUNTER — Telehealth: Payer: Self-pay

## 2023-08-24 ENCOUNTER — Ambulatory Visit: Payer: Self-pay | Admitting: Neurology

## 2023-08-24 DIAGNOSIS — G9520 Unspecified cord compression: Secondary | ICD-10-CM

## 2023-08-24 DIAGNOSIS — M542 Cervicalgia: Secondary | ICD-10-CM

## 2023-08-24 DIAGNOSIS — R202 Paresthesia of skin: Secondary | ICD-10-CM

## 2023-08-24 DIAGNOSIS — M4802 Spinal stenosis, cervical region: Secondary | ICD-10-CM

## 2023-08-24 NOTE — Telephone Encounter (Signed)
 Please let patient know that her MRI cervical spine shows narrowing at several levels in the neck, which is severe at C4-5 and causing cord compression and localized swelling. This explains her arms weakness, numbness, and tingling. I recommend STAT referral to neurosurgery for cervical cord compression with myelopathy. Thank you.

## 2023-08-24 NOTE — Telephone Encounter (Signed)
 [facility] called in with a call report Imaging done 08/18/2023 IMPRESSION: 1. 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 Secondary cord flattening at C4-5 with associated cord signal changes, which could reflect compressive edema and/or myelomalacia. Neuro surgical consultation recommended. 2. Multifactorial degenerative changes with resultant multilevel foraminal narrowing as above. Notable findings include moderate bilateral C4 and C5 foraminal stenosis, with moderate right worse than left C6 foraminal narrowing.

## 2023-08-24 NOTE — Telephone Encounter (Signed)
 Called patient and informed her of results and recommendations to per Dr. Tobie. Patient is aware a referral is being sent STAT to [facility]. Patient will call me if he doesn't hear form them. Patient verbalized understanding and had no further questions or concerns.   Referral has been created and faxed STAT

## 2023-08-25 ENCOUNTER — Telehealth: Payer: Self-pay

## 2023-08-25 NOTE — Telephone Encounter (Signed)
 Called patient and she is scheduled to see Neurosurgery on 09/15/23. Dr. Tobie has been made aware.   Paitet also aware we will canccel her EMG on Thursday since we no longer need EMG.

## 2023-08-26 ENCOUNTER — Encounter: Payer: Self-pay | Admitting: Family

## 2023-08-26 ENCOUNTER — Ambulatory Visit (INDEPENDENT_AMBULATORY_CARE_PROVIDER_SITE_OTHER): Admitting: Family

## 2023-08-26 VITALS — BP 127/83 | HR 94 | Temp 98.1°F | Resp 18 | Ht 65.0 in | Wt 225.4 lb

## 2023-08-26 DIAGNOSIS — F418 Other specified anxiety disorders: Secondary | ICD-10-CM

## 2023-08-26 DIAGNOSIS — Z1211 Encounter for screening for malignant neoplasm of colon: Secondary | ICD-10-CM

## 2023-08-26 DIAGNOSIS — E559 Vitamin D deficiency, unspecified: Secondary | ICD-10-CM | POA: Diagnosis not present

## 2023-08-26 DIAGNOSIS — E119 Type 2 diabetes mellitus without complications: Secondary | ICD-10-CM

## 2023-08-26 DIAGNOSIS — Z124 Encounter for screening for malignant neoplasm of cervix: Secondary | ICD-10-CM

## 2023-08-26 DIAGNOSIS — Z13220 Encounter for screening for lipoid disorders: Secondary | ICD-10-CM

## 2023-08-26 DIAGNOSIS — Z0000 Encounter for general adult medical examination without abnormal findings: Secondary | ICD-10-CM

## 2023-08-26 DIAGNOSIS — F32A Depression, unspecified: Secondary | ICD-10-CM

## 2023-08-26 MED ORDER — HYDROXYZINE PAMOATE 50 MG PO CAPS: 50.0000 mg | ORAL_CAPSULE | Freq: Three times a day (TID) | ORAL | 1 refills | Status: DC | PRN

## 2023-08-26 NOTE — Progress Notes (Signed)
 Patient ID: GENEVIVE PRINTUP, female    DOB: 21-Dec-1975  MRN: [id number]  CC: Annual Exam  Subjective: Tenesha Garza is a 48 y.o. female who presents for annual exam.   Her concerns today include:  - States upcoming appointment for mammogram.  - States upcoming appointment with Neurosurgery.  - Anxiety persisting. States doesn't feel Hydroxyzine  helping as much as she would like. She denies thoughts of self-harm, suicidal ideations, homicidal ideations. - Patient has question about whether she is to continue Semaglutide  as current dose. I discussed with patient in detail due to her recent hemoglobin A1c was at goal at 6.0% on 07/22/2023 she can continue current dose of Semaglutide . Patient to follow-up in 8 weeks or sooner if needed. Patient verbalized understanding.  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   Morbid obesity (HCC) 12/24/2020   Allergic rhinitis 06/26/2020   Hyperlipidemia associated with type 2 diabetes mellitus (HCC) 04/10/2020   Chronic pain syndrome 04/09/2020   Type 2 diabetes mellitus with diabetic neuropathy, without long-term current use of insulin  (HCC) 04/09/2020   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 12/13/2019   Vitamin D  deficiency 04/05/2019   Class 2 severe obesity with serious comorbidity and body mass index (BMI) of 36.0 to 36.9 in adult (HCC)  04/05/2019   Obesity (BMI 30-39.9) 03/05/2018   Chronic diarrhea    Benign neoplasm of transverse colon    Benign neoplasm of sigmoid colon    Neuropathic pain of both legs 06/04/2016   Rash and nonspecific skin eruption 07/12/2015   Dandruff 03/26/2015   Migraine variant with headache 05/09/2014   GAD (generalized anxiety disorder) 05/04/2014   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   Social anxiety disorder 05/04/2014   PTSD (post-traumatic stress disorder) 05/04/2014   Depression 03/27/2014   OSA (obstructive sleep apnea) 03/06/2014   Insomnia 03/06/2014   History of cardiac arrest    Chest pain 02/20/2014   Rectal bleeding 07/25/2013   S/P Total vaginal hysterectomy on 01/06/13 01/06/2013   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 6.7 g 2   atorvastatin  (LIPITOR) 10 MG tablet Take 1 tablet by mouth once daily 90 tablet 0   Blood Glucose Monitoring Suppl (ACCU-CHEK GUIDE) w/Device KIT Check blood sugars daily 1 kit 0   busPIRone  (BUSPAR ) 15 MG tablet Take 1 tablet (15 mg total) by mouth 2 (two) times daily. 60 tablet 3   dicyclomine  (BENTYL ) 10 MG capsule Take 1 capsule (10 mg total) by mouth 3 (three) times daily before meals. 90 capsule 1   DULoxetine  40 MG CPEP Take 1 capsule (40 mg total)  by mouth daily. 90 capsule 1   EPINEPHrine  0.3 mg/0.3 mL IJ SOAJ injection Inject 0.3 mg into the muscle as needed for anaphylaxis. 1 each 1   gabapentin  (NEURONTIN ) 300 MG capsule Take 1 capsule (300 mg total) by mouth 2 (two) times daily. 180 capsule 1   glucose blood (ACCU-CHEK GUIDE TEST) test strip Use as instructed. Check blood glucose level by fingerstick 1-2 times per day. E11.65 100  each 12   Multiple Vitamins-Minerals (BARIATRIC MULTIVITAMINS/IRON PO) Take 1 tablet by mouth daily.     ondansetron  (ZOFRAN ) 4 MG tablet Take 1 tablet (4 mg total) by mouth every 6 (six) hours as needed for nausea. 20 tablet 0   potassium chloride  SA (KLOR-CON  M) 20 MEQ tablet Take 2 tablets (40 mEq total) by mouth daily. 60 tablet 1   Semaglutide ,0.25 or 0.5MG /DOS, 2 MG/3ML SOPN Inject 0.25 mg into the skin once a week. 6 mL 1   SUMAtriptan  (IMITREX ) 100 MG tablet Take 1 tablet at the start of the headache. 12 tablet 3   topiramate  (TOPAMAX ) 25 MG tablet TAKE 1 TABLET BY MOUTH AT BEDTIME TO  DECREASE  MIGRAINE  FREQUENCY 90 tablet 0   Vitamin D , Ergocalciferol , (DRISDOL ) 1.25 MG (50000 UNIT) CAPS capsule Take 1 capsule (50,000 Units total) by mouth every 7 (seven) days. 12 capsule 1   albuterol  (PROVENTIL ) (2.5 MG/3ML) 0.083% nebulizer solution Take 3 mLs (2.5 mg total) by nebulization every 6 (six) hours as needed for wheezing or shortness of breath. 150 mL 1   No current facility-administered medications on file prior to visit.    Allergies  Allergen Reactions   Asa [Aspirin] Anaphylaxis and Hives    Hives, chest tightness    Mushroom Extract Complex (Obsolete) Anaphylaxis, Swelling and Other (See Comments)    Reaction:  Eye swelling   Penicillins Anaphylaxis    Anaphylaxis ~48 yo necessitating ED visit (took pink thick liquid) - cannot recall taking any cephalosporins   Shellfish Allergy Anaphylaxis   Triamcinolone  Other (See Comments)    Skin issues    Zoloft  [Sertraline ]     Jittery feeling    Social History   Socioeconomic History   Marital status: Married    Spouse name: Not on file   Number of children: 1   Years of education: Not on file   Highest education level: Some college, no degree  Occupational History   Occupation: disability  Tobacco Use   Smoking status: Former    Current packs/day: 0.00    Average packs/day: 0.2 packs/day for 11.0 years (2.2 ttl pk-yrs)     Types: Cigarettes    Start date: 11/18/2002    Quit date: 11/17/2013    Years since quitting: 9.7   Smokeless tobacco: Never   Tobacco comments:    2 years quit  Vaping Use   Vaping status: Never Used  Substance and Sexual Activity   Alcohol use: No    Alcohol/week: 0.0 standard drinks of alcohol   Drug use: No   Sexual activity: Yes    Birth control/protection: Surgical  Other Topics Concern   Not on file  Social History Narrative   Lives with sister   Drinks no caffeine   Right Handed    Lives in a one story home    Social Drivers of Health   Financial Resource Strain: Low Risk  (07/22/2023)   Overall Financial Resource Strain (CARDIA)    Difficulty of Paying Living Expenses: Not hard at all  Food Insecurity:  No Food Insecurity (07/22/2023)   Hunger Vital Sign    Worried About Running Out of Food in the Last Year: Never true    Ran Out of Food in the Last Year: Never true  Transportation Needs: No Transportation Needs (07/22/2023)   PRAPARE - Administrator, Civil Service (Medical): No    Lack of Transportation (Non-Medical): No  Physical Activity: Insufficiently Active (07/22/2023)   Exercise Vital Sign    Days of Exercise per Week: 2 days    Minutes of Exercise per Session: 40 min  Stress: Stress Concern Present (07/22/2023)   [employer] of Occupational Health - Occupational Stress Questionnaire    Feeling of Stress : Very much  Social Connections: Socially Integrated (07/22/2023)   Social Connection and Isolation Panel    Frequency of Communication with Friends and Family: More than three times a week    Frequency of Social Gatherings with Friends and Family: Twice a week    Attends Religious Services: More than 4 times per year    Active Member of [employer] or Organizations: Yes    Attends Banker Meetings: More than 4 times per year    Marital Status: Married  Catering manager Violence: Not At Risk (07/22/2023)   Humiliation, Afraid, Rape, and  Kick questionnaire    Fear of Current or Ex-Partner: No    Emotionally Abused: No    Physically Abused: No    Sexually Abused: No    Family History  Problem Relation Age of Onset   Hypertension Mother    Diabetes Mother    Allergies Mother    Heart disease Mother    Clotting disorder Mother    Stroke Mother    Kidney disease Mother    Thyroid  disease Mother    Cancer Father    Hyperlipidemia Father    Hypertension Father    Colon cancer Father    Liver disease Father    Heart disease Maternal Grandmother    Breast cancer Maternal Grandmother 46   Schizophrenia Sister    Bipolar disorder Sister    Clotting disorder Sister    Bipolar disorder Brother    Kidney disease Brother    Bipolar disorder Sister    Pancreatic cancer Maternal Aunt    Prostate cancer Maternal Uncle    Liver cancer Maternal Grandfather    Rectal cancer Maternal Grandfather    Liver cancer Paternal Grandfather    Colon polyps Neg Hx    Esophageal cancer Neg Hx    Stomach cancer Neg Hx     Past Surgical History:  Procedure Laterality Date   ABDOMINAL HYSTERECTOMY     partial   BIOPSY  01/22/2023   Procedure: BIOPSY;  Surgeon: Legrand Victory LITTIE DOUGLAS, MD;  Location: [facility];  Service: Gastroenterology;;   COLONOSCOPY Left 04/29/2013   Procedure: COLONOSCOPY;  Surgeon: Elsie Cree, MD;  Location: [facility];  Service: Endoscopy;  Laterality: Left;   COLONOSCOPY     COLONOSCOPY WITH PROPOFOL  N/A 08/01/2016   Procedure: COLONOSCOPY WITH PROPOFOL ;  Surgeon: Legrand Victory LITTIE DOUGLAS, MD;  Location: [facility];  Service: Gastroenterology;  Laterality: N/A;   COLONOSCOPY WITH PROPOFOL  N/A 01/22/2023   Procedure: COLONOSCOPY WITH PROPOFOL ;  Surgeon: Legrand Victory LITTIE DOUGLAS, MD;  Location: [facility];  Service: Gastroenterology;  Laterality: N/A;   ECTOPIC PREGNANCY SURGERY     ESOPHAGOGASTRODUODENOSCOPY (EGD) WITH PROPOFOL  N/A 11/10/2018   Procedure: ESOPHAGOGASTRODUODENOSCOPY (EGD) WITH PROPOFOL ;  Surgeon:  Legrand Victory LITTIE DOUGLAS, MD;  Location: [facility];  Service: Gastroenterology;  Laterality: N/A;   EVALUATION UNDER ANESTHESIA WITH FISTULECTOMY N/A 04/20/2014   Procedure: EXAM UNDER ANESTHESIA ;  Surgeon: Bernarda Ned, MD;  Location: [facility];  Service: General;  Laterality: N/A;   FLEXIBLE SIGMOIDOSCOPY N/A 11/09/2013   Procedure: FLEXIBLE SIGMOIDOSCOPY;  Surgeon: Elsie Cree, MD;  Location: [facility];  Service: Endoscopy;  Laterality: N/A;   fupa removal      LAPAROSCOPIC CHOLECYSTECTOMY  2005   LAPAROSCOPIC GASTRIC SLEEVE RESECTION N/A 12/24/2020   Procedure: LAPAROSCOPIC GASTRIC SLEEVE RESECTION;  Surgeon: Signe Mitzie LABOR, MD;  Location: [facility];  Service: General;  Laterality: N/A;   REFRACTIVE SURGERY     SPHINCTEROTOMY N/A 04/20/2014   Procedure:  LATERAL INTERNAL SPHINCTEROTOMY;  Surgeon: Bernarda Ned, MD;  Location: [facility];  Service: General;  Laterality: N/A;   TRANSTHORACIC ECHOCARDIOGRAM  12/30/2012   mild LVH/  ef 55-60%   UNILATERAL SALPINGECTOMY  2009   laparotomy left salpingectomy-- ectopic preg.   UPPER GASTROINTESTINAL ENDOSCOPY     UPPER GI ENDOSCOPY N/A 12/24/2020   Procedure: UPPER GI ENDOSCOPY;  Surgeon: Signe Mitzie LABOR, MD;  Location: [facility];  Service: General;  Laterality: N/A;   VAGINAL HYSTERECTOMY N/A 01/06/2013   Procedure: HYSTERECTOMY VAGINAL;  Surgeon: Gloris LABOR Hugger, MD;  Location: [facility];  Service: Gynecology;  Laterality: N/A;    ROS: Review of Systems Negative except as stated above  PHYSICAL EXAM: BP 127/83   Pulse 94   Temp 98.1 F (36.7 C) (Oral)   Resp 18   Ht 5' 5 (1.651 m)   Wt 225 lb 6.4 oz (102.2 kg)   LMP 11/27/2012   SpO2 94%   BMI 37.51 kg/m   Physical Exam HENT:     Head: Normocephalic and atraumatic.     Right Ear: Tympanic membrane, ear canal and external ear normal.     Left Ear: Tympanic membrane, ear canal and external ear normal.     Nose: Nose normal.      Mouth/Throat:     Mouth: Mucous membranes are moist.     Pharynx: Oropharynx is clear.  Eyes:     Extraocular Movements: Extraocular movements intact.     Conjunctiva/sclera: Conjunctivae normal.     Pupils: Pupils are equal, round, and reactive to light.  Neck:     Thyroid : No thyroid  mass, thyromegaly or thyroid  tenderness.  Cardiovascular:     Rate and Rhythm: Normal rate and regular rhythm.     Pulses: Normal pulses.     Heart sounds: Normal heart sounds.  Pulmonary:     Effort: Pulmonary effort is normal.     Breath sounds: Normal breath sounds.  Chest:     Comments: Patient declined. Abdominal:     General: Bowel sounds are normal.     Palpations: Abdomen is soft.  Genitourinary:    Comments: Patient declined. Musculoskeletal:        General: Normal range of motion.     Right shoulder: Normal.     Left shoulder: Normal.     Right upper arm: Normal.     Left upper arm: Normal.     Right elbow: Normal.     Left elbow: Normal.     Right forearm: Normal.     Left forearm: Normal.     Right wrist: Normal.     Left wrist: Normal.     Right hand: Normal.     Left hand: Normal.  Cervical back: Normal, normal range of motion and neck supple.     Thoracic back: Normal.     Lumbar back: Normal.     Right hip: Normal.     Left hip: Normal.     Right upper leg: Normal.     Left upper leg: Normal.     Right knee: Normal.     Left knee: Normal.     Right lower leg: Normal.     Left lower leg: Normal.     Right ankle: Normal.     Left ankle: Normal.     Right foot: Normal.     Left foot: Normal.  Skin:    General: Skin is warm and dry.     Capillary Refill: Capillary refill takes less than 2 seconds.  Neurological:     General: No focal deficit present.     Mental Status: She is alert and oriented to person, place, and time.  Psychiatric:        Mood and Affect: Mood normal.        Behavior: Behavior normal.     ASSESSMENT AND PLAN: 1. Annual physical exam  (Primary) - Counseled on 150 minutes of exercise per week as tolerated, healthy eating (including decreased daily intake of saturated fats, cholesterol, added sugars, sodium), STI prevention, and routine healthcare maintenance.  2. Screening cholesterol level - Routine screening.  - Lipid panel  3. Vitamin D  deficiency - Routine screening.  - Vitamin D , 25-hydroxy  4. Cervical cancer screening - Referral to Gynecology for evaluation/management. - Ambulatory referral to Gynecology  5. Colon cancer screening - Referral to Gastroenterology for colon cancer screening by colonoscopy.  - Ambulatory referral to Gastroenterology  6. Diabetic eye exam [facility]) - Referral to Ophthalmology for evaluation/management. - Ambulatory referral to Ophthalmology  7. Encounter for diabetic foot exam [facility]) - Referral to Podiatry for evaluation/management. - Ambulatory referral to Podiatry  8. Anxiety and depression - Patient denies thoughts of self-harm, suicidal ideations, homicidal ideations. - Increase Hydroxyzine  from 25 mg to 50 mg as prescribed. Counseled on medication adherence/adverse effects.  - Follow-up with primary provider in 4 weeks or sooner if needed.  - hydrOXYzine  (VISTARIL ) 50 MG capsule; Take 1 capsule (50 mg total) by mouth every 8 (eight) hours as needed.  Dispense: 90 capsule; Refill: 1   Patient was given the opportunity to ask questions.  Patient verbalized understanding of the plan and was able to repeat key elements of the plan. Patient was given clear instructions to go to Emergency Department or return to medical center if symptoms don't improve, worsen, or new problems develop.The patient verbalized understanding.   Orders Placed This Encounter  Procedures   Lipid panel   Vitamin D , 25-hydroxy   Ambulatory referral to Ophthalmology   Ambulatory referral to Podiatry   Ambulatory referral to Gastroenterology   Ambulatory referral to Gynecology     Requested  Prescriptions   Signed Prescriptions Disp Refills   hydrOXYzine  (VISTARIL ) 50 MG capsule 90 capsule 1    Sig: Take 1 capsule (50 mg total) by mouth every 8 (eight) hours as needed.    Return in about 1 year (around 08/25/2024) for Physical per patient preference.  Greig JINNY Drones, NP

## 2023-08-26 NOTE — Progress Notes (Signed)
 Talk about the injection she is taking

## 2023-08-27 ENCOUNTER — Encounter: Admitting: Neurology

## 2023-08-31 ENCOUNTER — Ambulatory Visit: Payer: Self-pay | Admitting: Family

## 2023-08-31 DIAGNOSIS — E785 Hyperlipidemia, unspecified: Secondary | ICD-10-CM

## 2023-08-31 DIAGNOSIS — E559 Vitamin D deficiency, unspecified: Secondary | ICD-10-CM

## 2023-08-31 LAB — VITAMIN D 25 HYDROXY (VIT D DEFICIENCY, FRACTURES): Vit D, 25-Hydroxy: 16.4 ng/mL — ABNORMAL LOW (ref 30.0–100.0)

## 2023-08-31 LAB — LIPID PANEL
Chol/HDL Ratio: 3.7 ratio (ref 0.0–4.4)
Cholesterol, Total: 136 mg/dL (ref 100–199)
HDL: 37 mg/dL — ABNORMAL LOW (ref >39)
LDL Chol Calc (NIH): 50 mg/dL (ref 0–99)
Triglycerides: 324 mg/dL — ABNORMAL HIGH (ref 0–149)
VLDL Cholesterol Cal: 49 mg/dL — ABNORMAL HIGH (ref 5–40)

## 2023-08-31 MED ORDER — VITAMIN D (ERGOCALCIFEROL) 1.25 MG (50000 UNIT) PO CAPS: 50000.0000 [IU] | ORAL_CAPSULE | ORAL | 0 refills | Status: DC

## 2023-08-31 MED ORDER — ATORVASTATIN CALCIUM 20 MG PO TABS: 20.0000 mg | ORAL_TABLET | Freq: Every day | ORAL | 0 refills | Status: DC

## 2023-09-07 ENCOUNTER — Other Ambulatory Visit: Payer: Self-pay | Admitting: Internal Medicine

## 2023-09-08 NOTE — Telephone Encounter (Signed)
 Atorvastatin  prescribed (08/31/2023  8:13 AM EDT) for 90 day supply.

## 2023-09-09 NOTE — Telephone Encounter (Signed)
 Atorvastatin  prescribed (08/31/2023  8:13 AM EDT) for 90 day supply.

## 2023-09-09 NOTE — Telephone Encounter (Signed)
 Atorvastatin  prescribed (08/31/2023  8:13 AM EDT) for 90 day supply. [place] please specify how I can further assist. Thank you.

## 2023-09-10 ENCOUNTER — Other Ambulatory Visit: Payer: Self-pay | Admitting: Family

## 2023-09-10 DIAGNOSIS — E785 Hyperlipidemia, unspecified: Secondary | ICD-10-CM

## 2023-09-10 MED ORDER — ATORVASTATIN CALCIUM 20 MG PO TABS: 20.0000 mg | ORAL_TABLET | Freq: Every day | ORAL | 0 refills | Status: DC

## 2023-09-10 NOTE — Telephone Encounter (Signed)
 Complete

## 2023-09-14 ENCOUNTER — Ambulatory Visit: Payer: Self-pay | Admitting: Family

## 2023-09-14 ENCOUNTER — Ambulatory Visit
Admission: RE | Admit: 2023-09-14 | Discharge: 2023-09-14 | Disposition: A | Discharge status: Home / Self Care | Source: Ambulatory Visit | Attending: Family | Admitting: Family

## 2023-09-14 DIAGNOSIS — N644 Mastodynia: Secondary | ICD-10-CM

## 2023-09-14 NOTE — Progress Notes (Signed)
 noted

## 2023-09-17 ENCOUNTER — Other Ambulatory Visit: Payer: Self-pay | Admitting: Neurosurgery

## 2023-09-17 NOTE — Telephone Encounter (Signed)
 Pt, called and she has not heard from the surgical team and she wants to check the status . Please call.

## 2023-09-21 ENCOUNTER — Ambulatory Visit: Admitting: Podiatry

## 2023-10-05 NOTE — Progress Notes (Signed)
 PCP - Greig Drones, NP Cardiologist - none Gastro - Dr Victory Brand Internal Med - Dr Barnie Louder Neurology - Tonita Blanch, DO  Chest x-ray - 02/27/23 EKG - 06/30/23 Stress Test - n/a ECHO - 12/30/12 Cardiac Cath - n/a  ICD Pacemaker/Loop - n/a  Sleep Study -  Yes, 04/09/14 CPAP - does not use CPAP (mild)  Diabetes Type 2 Fasting Blood Sugar - 100-115s Checks Blood Sugar __2___ times a day  Last dose of GLP1 Semaglutide  was on  09/24/23.  GLP1 instructions: hold 7 days prior procedure   Aspirin & Blood Thinner Instructions:  n/a  ERAS - clear liquids til 10 AM DOS  Anesthesia review: Yes  STOP now taking any Aspirin (unless otherwise instructed by your surgeon), Aleve , Naproxen , Ibuprofen , Motrin , Advil , Goody's, BC's, all herbal medications, fish oil, and all vitamins.   Coronavirus Screening Do you have any of the following symptoms:  Cough yes/no: No Fever (>100.55F)  yes/no: No Runny nose yes/no: No Sore throat yes/no: No Difficulty breathing/shortness of breath  yes/no: No  Have you traveled in the last 14 days and where? yes/no: No  Patient verbalized understanding of instructions that were given to them at the PAT appointment. Patient was also instructed that they will need to review over the PAT instructions again at home before surgery.

## 2023-10-05 NOTE — Progress Notes (Signed)
 Surgical Instructions   Your procedure is scheduled on Friday October 09, 2023. Report to [facility] Main Entrance A at 11:00 A.M., then check in with the Admitting office. Any questions or running late day of surgery: call [phone]  Questions prior to your surgery date: call [phone], Monday-Friday, 8am-4pm. If you experience any cold or flu symptoms such as cough, fever, chills, shortness of breath, etc. between now and your scheduled surgery, please notify us  at the above number.     Remember:  Do not eat after midnight the night before your surgery  You may drink clear liquids until 10:00 the morning of your surgery.   Clear liquids allowed are: Water , Non-Citrus Juices (without pulp), Carbonated Beverages, Clear Tea (no milk, honey, etc.), Black Coffee Only (NO MILK, CREAM OR POWDERED CREAMER of any kind), and Gatorade.    Take these medicines the morning of surgery with A SIP OF WATER   atorvastatin  (LIPITOR)  busPIRone  (BUSPAR )  dicyclomine  (BENTYL )   May take these medicines IF NEEDED: albuterol  (PROVENTIL  HFA) 108 (90 Base) MCG/ACT inhaler Please bring with you to the hospital albuterol  (PROVENTIL ) (2.5 MG/3ML) 0.083% nebulizer solution  EPINEPHrine   gabapentin  (NEURONTIN )  hydrOXYzine  (VISTARIL )   One week prior to surgery, STOP taking any Aspirin (unless otherwise instructed by your surgeon) Aleve , Naproxen , Ibuprofen , Motrin , Advil , Goody's, BC's, all herbal medications, fish oil, and non-prescription vitamins.   WHAT DO I DO ABOUT MY DIABETES MEDICATION?   Do not take oral diabetes medicines (pills) the morning of surgery.        DO NOT TAKE YOUR Semaglutide  7 DAYS PRIOR TO SURGERY WITH THE LAST DOSE BEING NO LATER THAN 10/01/2023.    The day of surgery, do not take other diabetes injectables, including Byetta (exenatide), Bydureon (exenatide ER), Victoza (liraglutide), or Trulicity (dulaglutide).  If your CBG is greater than 220 mg/dL, you may take  of  your sliding scale (correction) dose of insulin .   HOW TO MANAGE YOUR DIABETES BEFORE AND AFTER SURGERY  Why is it important to control my blood sugar before and after surgery? Improving blood sugar levels before and after surgery helps healing and can limit problems. A way of improving blood sugar control is eating a healthy diet by:  Eating less sugar and carbohydrates  Increasing activity/exercise  Talking with your doctor about reaching your blood sugar goals High blood sugars (greater than 180 mg/dL) can raise your risk of infections and slow your recovery, so you will need to focus on controlling your diabetes during the weeks before surgery. Make sure that the doctor who takes care of your diabetes knows about your planned surgery including the date and location.  How do I manage my blood sugar before surgery? Check your blood sugar at least 4 times a day, starting 2 days before surgery, to make sure that the level is not too high or low.  Check your blood sugar the morning of your surgery when you wake up and every 2 hours until you get to the Short Stay unit.  If your blood sugar is less than 70 mg/dL, you will need to treat for low blood sugar: Do not take insulin . Treat a low blood sugar (less than 70 mg/dL) with  cup of clear juice (cranberry or apple), 4 glucose tablets, OR glucose gel. Recheck blood sugar in 15 minutes after treatment (to make sure it is greater than 70 mg/dL). If your blood sugar is not greater than 70 mg/dL on recheck, call [phone] for further  instructions. Report your blood sugar to the short stay nurse when you get to Short Stay.  If you are admitted to the hospital after surgery: Your blood sugar will be checked by the staff and you will probably be given insulin  after surgery (instead of oral diabetes medicines) to make sure you have good blood sugar levels. The goal for blood sugar control after surgery is 80-180 mg/dL.                       Do NOT Smoke (Tobacco/Vaping) for 24 hours prior to your procedure.  If you use a CPAP at night, you may bring your mask/headgear for your overnight stay.   You will be asked to remove any contacts, glasses, piercing's, hearing aid's, dentures/partials prior to surgery. Please bring cases for these items if needed.    Patients discharged the day of surgery will not be allowed to drive home, and someone needs to stay with them for 24 hours.  SURGICAL WAITING ROOM VISITATION Patients may have no more than 2 support people in the waiting area - these visitors may rotate.   Pre-op nurse will coordinate an appropriate time for 1 ADULT support person, who may not rotate, to accompany patient in pre-op.  Children under the age of 4 must have an adult with them who is not the patient and must remain in the main waiting area with an adult.  If the patient needs to stay at the hospital during part of their recovery, the visitor guidelines for inpatient rooms apply.  Please refer to the [facility] website for the visitor guidelines for any additional information.   If you received a COVID test during your pre-op visit  it is requested that you wear a mask when out in public, stay away from anyone that may not be feeling well and notify your surgeon if you develop symptoms. If you have been in contact with anyone that has tested positive in the last 10 days please notify you surgeon.      Pre-operative 5 CHG Bathing Instructions   You can play a key role in reducing the risk of infection after surgery. Your skin needs to be as free of germs as possible. You can reduce the number of germs on your skin by washing with CHG (chlorhexidine  gluconate) soap before surgery. CHG is an antiseptic soap that kills germs and continues to kill germs even after washing.   DO NOT use if you have an allergy to chlorhexidine /CHG or antibacterial soaps. If your skin becomes reddened or irritated, stop using the CHG  and notify one of our RNs at [phone].   Please shower with the CHG soap starting 4 days before surgery using the following schedule:     Please keep in mind the following:  DO NOT shave, including legs and underarms, starting the day of your first shower.   Place clean sheets on your bed the day you start using CHG soap. Use a clean washcloth (not used since being washed) for each shower. DO NOT sleep with pets once you start using the CHG.   CHG Shower Instructions:  Wash your face and private area with normal soap. If you choose to wash your hair, wash first with your normal shampoo.  After you use shampoo/soap, rinse your hair and body thoroughly to remove shampoo/soap residue.  Turn the water  OFF and apply about 3 tablespoons (45 ml) of CHG soap to a CLEAN washcloth.  Apply CHG  soap ONLY FROM YOUR NECK DOWN TO YOUR TOES (washing for 3-5 minutes)  DO NOT use CHG soap on face, private areas, open wounds, or sores.  Pay special attention to the area where your surgery is being performed.  If you are having back surgery, having someone wash your back for you may be helpful. Wait 2 minutes after CHG soap is applied, then you may rinse off the CHG soap.  Pat dry with a clean towel  Put on clean clothes/pajamas   If you choose to wear lotion, please use ONLY the CHG-compatible lotions that are listed below.  Additional instructions for the day of surgery: DO NOT APPLY any lotions, deodorants, or perfumes.   Do not bring valuables to the hospital. [facility] is not responsible for any belongings/valuables. Do not wear nail polish, gel polish, artificial nails, or any other type of covering on natural nails (fingers and toes) Do not wear jewelry or makeup Put on clean/comfortable clothes.  Please brush your teeth.  Ask your nurse before applying any prescription medications to the skin.     CHG Compatible Lotions   Aveeno Moisturizing lotion  Cetaphil Moisturizing Cream   Cetaphil Moisturizing Lotion  Clairol Herbal Essence Moisturizing Lotion, Dry Skin  Clairol Herbal Essence Moisturizing Lotion, Extra Dry Skin  Clairol Herbal Essence Moisturizing Lotion, Normal Skin  Curel Age Defying Therapeutic Moisturizing Lotion with Alpha Hydroxy  Curel Extreme Care Body Lotion  Curel Soothing Hands Moisturizing Hand Lotion  Curel Therapeutic Moisturizing Cream, Fragrance-Free  Curel Therapeutic Moisturizing Lotion, Fragrance-Free  Curel Therapeutic Moisturizing Lotion, Original Formula  Eucerin Daily Replenishing Lotion  Eucerin Dry Skin Therapy Plus Alpha Hydroxy Crme  Eucerin Dry Skin Therapy Plus Alpha Hydroxy Lotion  Eucerin Original Crme  Eucerin Original Lotion  Eucerin Plus Crme Eucerin Plus Lotion  Eucerin TriLipid Replenishing Lotion  Keri Anti-Bacterial Hand Lotion  Keri Deep Conditioning Original Lotion Dry Skin Formula Softly Scented  Keri Deep Conditioning Original Lotion, Fragrance Free Sensitive Skin Formula  Keri Lotion Fast Absorbing Fragrance Free Sensitive Skin Formula  Keri Lotion Fast Absorbing Softly Scented Dry Skin Formula  Keri Original Lotion  Keri Skin Renewal Lotion Keri Silky Smooth Lotion  Keri Silky Smooth Sensitive Skin Lotion  Nivea Body Creamy Conditioning Oil  Nivea Body Extra Enriched Lotion  Nivea Body Original Lotion  Nivea Body Sheer Moisturizing Lotion Nivea Crme  Nivea Skin Firming Lotion  NutraDerm 30 Skin Lotion  NutraDerm Skin Lotion  NutraDerm Therapeutic Skin Cream  NutraDerm Therapeutic Skin Lotion  ProShield Protective Hand Cream  Provon moisturizing lotion  Please read over the following fact sheets that you were given.

## 2023-10-06 ENCOUNTER — Encounter (HOSPITAL_COMMUNITY)
Admission: RE | Admit: 2023-10-06 | Discharge: 2023-10-06 | Disposition: A | Discharge status: Home / Self Care | Source: Ambulatory Visit | Attending: Neurosurgery | Admitting: Neurosurgery

## 2023-10-06 ENCOUNTER — Other Ambulatory Visit: Payer: Self-pay

## 2023-10-06 ENCOUNTER — Encounter (HOSPITAL_COMMUNITY): Payer: Self-pay

## 2023-10-06 VITALS — BP 144/100 | HR 104 | Temp 98.6°F | Resp 17 | Ht 65.0 in | Wt 226.0 lb

## 2023-10-06 DIAGNOSIS — Z8674 Personal history of sudden cardiac arrest: Secondary | ICD-10-CM | POA: Diagnosis not present

## 2023-10-06 DIAGNOSIS — Z01818 Encounter for other preprocedural examination: Secondary | ICD-10-CM

## 2023-10-06 DIAGNOSIS — E119 Type 2 diabetes mellitus without complications: Secondary | ICD-10-CM | POA: Insufficient documentation

## 2023-10-06 DIAGNOSIS — G4733 Obstructive sleep apnea (adult) (pediatric): Secondary | ICD-10-CM | POA: Insufficient documentation

## 2023-10-06 DIAGNOSIS — Z9884 Bariatric surgery status: Secondary | ICD-10-CM | POA: Insufficient documentation

## 2023-10-06 DIAGNOSIS — Z01812 Encounter for preprocedural laboratory examination: Secondary | ICD-10-CM | POA: Diagnosis present

## 2023-10-06 LAB — BASIC METABOLIC PANEL WITH GFR
Anion gap: 13 (ref 5–15)
BUN: <5 mg/dL — ABNORMAL LOW (ref 6–20)
CO2: 23 mmol/L (ref 22–32)
Calcium: 8.7 mg/dL — ABNORMAL LOW (ref 8.9–10.3)
Chloride: 103 mmol/L (ref 98–111)
Creatinine, Ser: 0.72 mg/dL (ref 0.44–1.00)
GFR, Estimated: >60 mL/min (ref >60)
Glucose, Bld: 117 mg/dL — ABNORMAL HIGH (ref 70–99)
Potassium: 3.7 mmol/L (ref 3.5–5.1)
Sodium: 139 mmol/L (ref 135–145)

## 2023-10-06 LAB — SURGICAL PCR SCREEN
MRSA, PCR: POSITIVE — AB
Staphylococcus aureus: POSITIVE — AB

## 2023-10-06 LAB — CBC
HCT: 45.7 % (ref 36.0–46.0)
Hemoglobin: 15.2 g/dL — ABNORMAL HIGH (ref 12.0–15.0)
MCH: 33.2 pg (ref 26.0–34.0)
MCHC: 33.3 g/dL (ref 30.0–36.0)
MCV: 99.8 fL (ref 80.0–100.0)
Platelets: 377 K/uL (ref 150–400)
RBC: 4.58 MIL/uL (ref 3.87–5.11)
RDW: 13.1 % (ref 11.5–15.5)
WBC: 7.8 K/uL (ref 4.0–10.5)
nRBC: 0 % (ref 0.0–0.2)

## 2023-10-06 LAB — HEMOGLOBIN A1C
Hgb A1c MFr Bld: 5.7 % — ABNORMAL HIGH (ref 4.8–5.6)
Mean Plasma Glucose: 116.89 mg/dL

## 2023-10-06 LAB — GLUCOSE, CAPILLARY: Glucose-Capillary: 118 mg/dL — ABNORMAL HIGH (ref 70–99)

## 2023-10-07 NOTE — Progress Notes (Signed)
 Anesthesia Chart Review:  48 year old female with pertinent history including non-insulin -dependent DM2, GERD, hyperlipidemia, multinodular thyroid , MDD/GAD/PTSD, migraines, OSA not on CPAP, asthma, obesity s/p laparoscopic gastric sleeve resection 12/2020, remote history of cardiac arrest during childbirth (1992).  Echocardiogram 2014 showed LVEF 55 to 60%, normal RV, normal valves.  Patient instructed to hold semaglutide  7 days prior to procedure.  Preop labs reviewed, unremarkable.  DM2 well-controlled with A1c 5.7.  EKG 06/30/2023: Sinus rhythm.  Rate 96. Borderline left axis deviation. Borderline T wave abnormalities.    Lynwood Geofm RIGGERS [facility] Short Stay Center/Anesthesiology Phone [phone] 10/07/2023 11:43 AM

## 2023-10-07 NOTE — Anesthesia Preprocedure Evaluation (Signed)
 Anesthesia Evaluation  Patient identified by MRN, date of birth, ID band Patient awake    Reviewed: Allergy & Precautions, H&P , NPO status , Patient's Chart, lab work & pertinent test results  Airway Mallampati: IV  TM Distance: >3 FB Neck ROM: Full    Dental  (+) Teeth Intact, Dental Advisory Given   Pulmonary asthma (last used albuterol  used 2d ago) , sleep apnea (sleep study remote past, mild OSA no CPAP) , former smoker   Pulmonary exam normal breath sounds clear to auscultation       Cardiovascular Normal cardiovascular exam Rhythm:Regular Rate:Normal  remote history of cardiac arrest during childbirth (1992).  Echocardiogram 2014 showed LVEF 55 to 60%, normal RV, normal valves.   Neuro/Psych  Headaches PSYCHIATRIC DISORDERS Anxiety Depression       GI/Hepatic Neg liver ROS, PUD,GERD  Medicated and Controlled,,  Endo/Other  diabetes, Well Controlled, Type 2, Oral Hypoglycemic Agents  BMI 37   Renal/GU negative Renal ROS  negative genitourinary   Musculoskeletal negative musculoskeletal ROS (+)    Abdominal  (+) + obese  Peds negative pediatric ROS (+)  Hematology negative hematology ROS (+) Hb 15.2, plt 377   Anesthesia Other Findings Semaglutide  LD 2 weeks ago  Reproductive/Obstetrics negative OB ROS                              Anesthesia Physical Anesthesia Plan  ASA: 3  Anesthesia Plan: General   Post-op Pain Management: Tylenol  PO (pre-op)*, Toradol  IV (intra-op)*, Ketamine  IV*, Dilaudid  IV and Precedex   Induction: Intravenous  PONV Risk Score and Plan: Ondansetron , Dexamethasone , Midazolam  and Treatment may vary due to age or medical condition  Airway Management Planned: Video Laryngoscope Planned and Oral ETT  Additional Equipment: None  Intra-op Plan:   Post-operative Plan: Extubation in OR  Informed Consent: I have reviewed the patients History and Physical,  chart, labs and discussed the procedure including the risks, benefits and alternatives for the proposed anesthesia with the patient or authorized representative who has indicated his/her understanding and acceptance.     Dental advisory given  Plan Discussed with: CRNA  Anesthesia Plan Comments: (  )         Anesthesia Quick Evaluation

## 2023-10-09 ENCOUNTER — Encounter (HOSPITAL_COMMUNITY): Payer: Self-pay | Admitting: Neurosurgery

## 2023-10-09 ENCOUNTER — Ambulatory Visit (HOSPITAL_COMMUNITY)
Admission: RE | Admit: 2023-10-09 | Discharge: 2023-10-10 | Disposition: A | Discharge status: Home / Self Care | Attending: Neurosurgery | Admitting: Neurosurgery

## 2023-10-09 ENCOUNTER — Ambulatory Visit (HOSPITAL_COMMUNITY): Admitting: Anesthesiology

## 2023-10-09 ENCOUNTER — Other Ambulatory Visit: Payer: Self-pay

## 2023-10-09 ENCOUNTER — Ambulatory Visit (HOSPITAL_COMMUNITY): Admitting: Physician Assistant

## 2023-10-09 ENCOUNTER — Ambulatory Visit (HOSPITAL_COMMUNITY)

## 2023-10-09 ENCOUNTER — Encounter (HOSPITAL_COMMUNITY)
Admission: RE | Disposition: A | Discharge status: Home / Self Care | Payer: Self-pay | Source: Home / Self Care | Attending: Neurosurgery

## 2023-10-09 DIAGNOSIS — K219 Gastro-esophageal reflux disease without esophagitis: Secondary | ICD-10-CM | POA: Insufficient documentation

## 2023-10-09 DIAGNOSIS — E119 Type 2 diabetes mellitus without complications: Secondary | ICD-10-CM | POA: Diagnosis not present

## 2023-10-09 DIAGNOSIS — M5021 Other cervical disc displacement,  high cervical region: Secondary | ICD-10-CM

## 2023-10-09 DIAGNOSIS — M4802 Spinal stenosis, cervical region: Secondary | ICD-10-CM | POA: Diagnosis not present

## 2023-10-09 DIAGNOSIS — Z87891 Personal history of nicotine dependence: Secondary | ICD-10-CM | POA: Diagnosis not present

## 2023-10-09 DIAGNOSIS — G4733 Obstructive sleep apnea (adult) (pediatric): Secondary | ICD-10-CM | POA: Insufficient documentation

## 2023-10-09 DIAGNOSIS — F418 Other specified anxiety disorders: Secondary | ICD-10-CM | POA: Diagnosis not present

## 2023-10-09 DIAGNOSIS — J45909 Unspecified asthma, uncomplicated: Secondary | ICD-10-CM | POA: Diagnosis not present

## 2023-10-09 DIAGNOSIS — M5001 Cervical disc disorder with myelopathy,  high cervical region: Secondary | ICD-10-CM | POA: Diagnosis not present

## 2023-10-09 DIAGNOSIS — E1140 Type 2 diabetes mellitus with diabetic neuropathy, unspecified: Secondary | ICD-10-CM

## 2023-10-09 DIAGNOSIS — G9520 Unspecified cord compression: Secondary | ICD-10-CM | POA: Diagnosis present

## 2023-10-09 HISTORY — PX: ANTERIOR CERVICAL DECOMP/DISCECTOMY FUSION: SHX1161

## 2023-10-09 LAB — GLUCOSE, CAPILLARY
Glucose-Capillary: 112 mg/dL — ABNORMAL HIGH (ref 70–99)
Glucose-Capillary: 95 mg/dL (ref 70–99)
Glucose-Capillary: 85 mg/dL (ref 70–99)
Glucose-Capillary: 204 mg/dL — ABNORMAL HIGH (ref 70–99)

## 2023-10-09 SURGERY — ANTERIOR CERVICAL DECOMPRESSION/DISCECTOMY FUSION 1 LEVEL
Anesthesia: General | Site: Spine Cervical

## 2023-10-09 MED ORDER — ONDANSETRON HCL 4 MG/2ML IJ SOLN: 4.0000 mg | Freq: Once | INTRAMUSCULAR | Status: DC | PRN

## 2023-10-09 MED ORDER — DIAZEPAM 5 MG PO TABS
5.0000 mg | ORAL_TABLET | Freq: Four times a day (QID) | ORAL | Status: DC | PRN
  Administered 2023-10-09 – 2023-10-10 (×2): 5 mg via ORAL
  Filled 2023-10-09 (×2): qty 1

## 2023-10-09 MED ORDER — ALBUTEROL SULFATE (2.5 MG/3ML) 0.083% IN NEBU: 2.5000 mg | INHALATION_SOLUTION | Freq: Four times a day (QID) | RESPIRATORY_TRACT | Status: DC | PRN

## 2023-10-09 MED ORDER — OXYCODONE HCL 5 MG PO TABS: 5.0000 mg | ORAL_TABLET | ORAL | Status: DC | PRN

## 2023-10-09 MED ORDER — LACTATED RINGERS IV SOLN: INTRAVENOUS | Status: DC

## 2023-10-09 MED ORDER — LACTATED RINGERS IV SOLN
INTRAVENOUS | Status: DC | PRN
Start: 2023-10-09 — End: 2023-10-09

## 2023-10-09 MED ORDER — LIDOCAINE-EPINEPHRINE 0.5 %-1:200000 IJ SOLN
INTRAMUSCULAR | Status: AC
  Filled 2023-10-09: qty 50

## 2023-10-09 MED ORDER — HYDROXYZINE HCL 50 MG PO TABS: 50.0000 mg | ORAL_TABLET | Freq: Three times a day (TID) | ORAL | Status: DC | PRN

## 2023-10-09 MED ORDER — OXYCODONE HCL 5 MG/5ML PO SOLN: 5.0000 mg | Freq: Once | ORAL | Status: DC | PRN

## 2023-10-09 MED ORDER — CHLORHEXIDINE GLUCONATE CLOTH 2 % EX PADS: 6.0000 | MEDICATED_PAD | Freq: Once | CUTANEOUS | Status: DC

## 2023-10-09 MED ORDER — FENTANYL CITRATE (PF) 250 MCG/5ML IJ SOLN
INTRAMUSCULAR | Status: AC
  Filled 2023-10-09: qty 5

## 2023-10-09 MED ORDER — FENTANYL CITRATE (PF) 250 MCG/5ML IJ SOLN
INTRAMUSCULAR | Status: DC | PRN
  Administered 2023-10-09 (×3): 50 ug via INTRAVENOUS
  Administered 2023-10-09: 100 ug via INTRAVENOUS

## 2023-10-09 MED ORDER — POTASSIUM CHLORIDE CRYS ER 20 MEQ PO TBCR
40.0000 meq | EXTENDED_RELEASE_TABLET | Freq: Every day | ORAL | Status: DC
  Administered 2023-10-09 – 2023-10-10 (×2): 40 meq via ORAL
  Filled 2023-10-09 (×2): qty 2

## 2023-10-09 MED ORDER — OXYCODONE HCL 5 MG PO TABS
10.0000 mg | ORAL_TABLET | ORAL | Status: DC | PRN
  Administered 2023-10-09 – 2023-10-10 (×5): 10 mg via ORAL
  Filled 2023-10-09 (×5): qty 2

## 2023-10-09 MED ORDER — PROPOFOL 10 MG/ML IV BOLUS
INTRAVENOUS | Status: DC | PRN
  Administered 2023-10-09: 200 mg via INTRAVENOUS

## 2023-10-09 MED ORDER — PHENOL 1.4 % MT LIQD
1.0000 | OROMUCOSAL | Status: DC | PRN
Start: 2023-10-09 — End: 2023-10-10

## 2023-10-09 MED ORDER — CHLORHEXIDINE GLUCONATE 0.12 % MT SOLN
15.0000 mL | Freq: Once | OROMUCOSAL | Status: AC
  Administered 2023-10-09: 15 mL via OROMUCOSAL
  Filled 2023-10-09: qty 15

## 2023-10-09 MED ORDER — DEXAMETHASONE SODIUM PHOSPHATE 10 MG/ML IJ SOLN
INTRAMUSCULAR | Status: DC | PRN
  Administered 2023-10-09: 5 mg via INTRAVENOUS

## 2023-10-09 MED ORDER — POTASSIUM CHLORIDE IN NACL 20-0.9 MEQ/L-% IV SOLN
INTRAVENOUS | Status: DC
  Filled 2023-10-09 (×2): qty 1000

## 2023-10-09 MED ORDER — ACETAMINOPHEN 650 MG RE SUPP: 650.0000 mg | RECTAL | Status: DC | PRN

## 2023-10-09 MED ORDER — MENTHOL 3 MG MT LOZG: 1.0000 | LOZENGE | OROMUCOSAL | Status: DC | PRN

## 2023-10-09 MED ORDER — LACTATED RINGERS IV SOLN: INTRAVENOUS | Status: DC | PRN

## 2023-10-09 MED ORDER — THROMBIN 5000 UNITS EX KIT
PACK | CUTANEOUS | Status: AC
  Filled 2023-10-09: qty 2

## 2023-10-09 MED ORDER — LIDOCAINE-EPINEPHRINE 0.5 %-1:200000 IJ SOLN
INTRAMUSCULAR | Status: DC | PRN
  Administered 2023-10-09: 5 mL

## 2023-10-09 MED ORDER — LIDOCAINE 2% (20 MG/ML) 5 ML SYRINGE
INTRAMUSCULAR | Status: DC | PRN
  Administered 2023-10-09: 60 mg via INTRAVENOUS

## 2023-10-09 MED ORDER — TOPIRAMATE 25 MG PO TABS
25.0000 mg | ORAL_TABLET | Freq: Every day | ORAL | Status: DC
Start: 2023-10-09 — End: 2023-10-10
  Administered 2023-10-09 – 2023-10-10 (×2): 25 mg via ORAL
  Filled 2023-10-09 (×2): qty 1

## 2023-10-09 MED ORDER — KETAMINE HCL 50 MG/5ML IJ SOSY
PREFILLED_SYRINGE | INTRAMUSCULAR | Status: AC
  Filled 2023-10-09: qty 5

## 2023-10-09 MED ORDER — MIDAZOLAM HCL 2 MG/2ML IJ SOLN
INTRAMUSCULAR | Status: AC
  Filled 2023-10-09: qty 2

## 2023-10-09 MED ORDER — MORPHINE SULFATE (PF) 2 MG/ML IV SOLN
2.0000 mg | INTRAVENOUS | Status: DC | PRN
  Administered 2023-10-09: 2 mg via INTRAVENOUS
  Filled 2023-10-09: qty 1

## 2023-10-09 MED ORDER — PHENYLEPHRINE 80 MCG/ML (10ML) SYRINGE FOR IV PUSH (FOR BLOOD PRESSURE SUPPORT)
PREFILLED_SYRINGE | INTRAVENOUS | Status: DC | PRN
  Administered 2023-10-09: 160 ug via INTRAVENOUS

## 2023-10-09 MED ORDER — EPINEPHRINE 0.3 MG/0.3ML IJ SOAJ: 0.3000 mg | INTRAMUSCULAR | Status: DC | PRN

## 2023-10-09 MED ORDER — DICYCLOMINE HCL 10 MG PO CAPS
10.0000 mg | ORAL_CAPSULE | Freq: Three times a day (TID) | ORAL | Status: DC
  Administered 2023-10-10: 10 mg via ORAL
  Filled 2023-10-09: qty 1

## 2023-10-09 MED ORDER — ROCURONIUM BROMIDE 10 MG/ML (PF) SYRINGE
PREFILLED_SYRINGE | INTRAVENOUS | Status: DC | PRN
  Administered 2023-10-09: 100 mg via INTRAVENOUS
  Administered 2023-10-09: 20 mg via INTRAVENOUS

## 2023-10-09 MED ORDER — ONDANSETRON HCL 4 MG/2ML IJ SOLN: 4.0000 mg | Freq: Four times a day (QID) | INTRAMUSCULAR | Status: DC | PRN

## 2023-10-09 MED ORDER — HYDROMORPHONE HCL 1 MG/ML IJ SOLN
INTRAMUSCULAR | Status: AC
Start: 2023-10-09 — End: 2023-10-09
  Filled 2023-10-09: qty 1

## 2023-10-09 MED ORDER — OXYCODONE HCL 5 MG PO TABS: 5.0000 mg | ORAL_TABLET | Freq: Once | ORAL | Status: DC | PRN

## 2023-10-09 MED ORDER — SUGAMMADEX SODIUM 200 MG/2ML IV SOLN
INTRAVENOUS | Status: DC | PRN
  Administered 2023-10-09: 200 mg via INTRAVENOUS
  Administered 2023-10-09: 100 mg via INTRAVENOUS

## 2023-10-09 MED ORDER — INSULIN ASPART 100 UNIT/ML IJ SOLN
0.0000 [IU] | Freq: Three times a day (TID) | INTRAMUSCULAR | Status: DC
  Administered 2023-10-10: 2 [IU] via SUBCUTANEOUS

## 2023-10-09 MED ORDER — INSULIN ASPART 100 UNIT/ML IJ SOLN: 0.0000 [IU] | INTRAMUSCULAR | Status: DC | PRN

## 2023-10-09 MED ORDER — PHENYLEPHRINE HCL-NACL 20-0.9 MG/250ML-% IV SOLN
INTRAVENOUS | Status: DC | PRN
  Administered 2023-10-09: 25 ug/min via INTRAVENOUS

## 2023-10-09 MED ORDER — ACETAMINOPHEN 325 MG PO TABS: 650.0000 mg | ORAL_TABLET | ORAL | Status: DC | PRN

## 2023-10-09 MED ORDER — GABAPENTIN 300 MG PO CAPS: 300.0000 mg | ORAL_CAPSULE | Freq: Two times a day (BID) | ORAL | Status: DC | PRN

## 2023-10-09 MED ORDER — ONDANSETRON HCL 4 MG PO TABS: 4.0000 mg | ORAL_TABLET | Freq: Four times a day (QID) | ORAL | Status: DC | PRN

## 2023-10-09 MED ORDER — VANCOMYCIN HCL IN DEXTROSE 1-5 GM/200ML-% IV SOLN
1000.0000 mg | INTRAVENOUS | Status: AC
  Administered 2023-10-09: 1000 mg via INTRAVENOUS
  Filled 2023-10-09: qty 200

## 2023-10-09 MED ORDER — INSULIN ASPART 100 UNIT/ML IJ SOLN
0.0000 [IU] | Freq: Every day | INTRAMUSCULAR | Status: DC
  Administered 2023-10-09: 2 [IU] via SUBCUTANEOUS

## 2023-10-09 MED ORDER — KETAMINE HCL 10 MG/ML IJ SOLN
INTRAMUSCULAR | Status: DC | PRN
  Administered 2023-10-09 (×2): 20 mg via INTRAVENOUS

## 2023-10-09 MED ORDER — ONDANSETRON HCL 4 MG/2ML IJ SOLN
INTRAMUSCULAR | Status: DC | PRN
  Administered 2023-10-09: 4 mg via INTRAVENOUS

## 2023-10-09 MED ORDER — SODIUM CHLORIDE 0.9% FLUSH
3.0000 mL | Freq: Two times a day (BID) | INTRAVENOUS | Status: DC
  Administered 2023-10-09: 3 mL via INTRAVENOUS

## 2023-10-09 MED ORDER — BUSPIRONE HCL 15 MG PO TABS
15.0000 mg | ORAL_TABLET | Freq: Two times a day (BID) | ORAL | Status: DC
  Administered 2023-10-09 – 2023-10-10 (×2): 15 mg via ORAL
  Filled 2023-10-09 (×2): qty 1

## 2023-10-09 MED ORDER — 0.9 % SODIUM CHLORIDE (POUR BTL) OPTIME
TOPICAL | Status: DC | PRN
  Administered 2023-10-09: 1000 mL

## 2023-10-09 MED ORDER — ORAL CARE MOUTH RINSE: 15.0000 mL | Freq: Once | OROMUCOSAL | Status: AC

## 2023-10-09 MED ORDER — ACETAMINOPHEN 500 MG PO TABS
1000.0000 mg | ORAL_TABLET | Freq: Four times a day (QID) | ORAL | Status: DC
  Administered 2023-10-09 – 2023-10-10 (×2): 1000 mg via ORAL
  Filled 2023-10-09 (×2): qty 2

## 2023-10-09 MED ORDER — HYDROMORPHONE HCL 1 MG/ML IJ SOLN
0.2500 mg | INTRAMUSCULAR | Status: DC | PRN
  Administered 2023-10-09 (×2): 0.5 mg via INTRAVENOUS

## 2023-10-09 MED ORDER — MIDAZOLAM HCL 2 MG/2ML IJ SOLN
INTRAMUSCULAR | Status: DC | PRN
  Administered 2023-10-09: 2 mg via INTRAVENOUS

## 2023-10-09 MED ORDER — THROMBIN 5000 UNITS EX SOLR
CUTANEOUS | Status: DC | PRN
Start: 2023-10-09 — End: 2023-10-09
  Administered 2023-10-09: 10000 [IU] via TOPICAL

## 2023-10-09 MED ORDER — AMISULPRIDE (ANTIEMETIC) 5 MG/2ML IV SOLN: 10.0000 mg | Freq: Once | INTRAVENOUS | Status: DC | PRN

## 2023-10-09 MED ORDER — ACETAMINOPHEN 10 MG/ML IV SOLN
INTRAVENOUS | Status: DC | PRN
  Administered 2023-10-09: 1000 mg via INTRAVENOUS

## 2023-10-09 MED ORDER — SODIUM CHLORIDE 0.9% FLUSH: 3.0000 mL | INTRAVENOUS | Status: DC | PRN

## 2023-10-09 MED ORDER — ATORVASTATIN CALCIUM 10 MG PO TABS
20.0000 mg | ORAL_TABLET | Freq: Every day | ORAL | Status: DC
  Administered 2023-10-09 – 2023-10-10 (×2): 20 mg via ORAL
  Filled 2023-10-09 (×2): qty 2

## 2023-10-09 MED ORDER — SODIUM CHLORIDE 0.9 % IV SOLN: 250.0000 mL | INTRAVENOUS | Status: DC

## 2023-10-09 SURGICAL SUPPLY — 38 items
ALLOGRAFT TRAID CC 9X11X14 (Bone Implant) ×2 IMPLANT
BAG COUNTER SPONGE SURGICOUNT (BAG) ×2 IMPLANT
BAND RUBBER #18 3X1/16 STRL (MISCELLANEOUS) ×4 IMPLANT
BUR DRUM 4.0 (BURR) ×2 IMPLANT
BUR MATCHSTICK NEURO 3.0 LAGG (BURR) ×2 IMPLANT
CANISTER SUCTION 3000ML PPV (SUCTIONS) ×2 IMPLANT
DERMABOND ADVANCED .7 DNX12 (GAUZE/BANDAGES/DRESSINGS) ×2 IMPLANT
DRAPE HALF SHEET 40X57 (DRAPES) IMPLANT
DRAPE LAPAROTOMY 100X72 PEDS (DRAPES) ×2 IMPLANT
DRAPE MICROSCOPE SLANT 54X150 (MISCELLANEOUS) ×2 IMPLANT
DURAPREP 6ML APPLICATOR 50/CS (WOUND CARE) ×2 IMPLANT
ELECT COATED BLADE 2.86 ST (ELECTRODE) ×2 IMPLANT
ELECTRODE REM PT RTRN 9FT ADLT (ELECTROSURGICAL) ×2 IMPLANT
GAUZE 4X4 16PLY ~~LOC~~+RFID DBL (SPONGE) IMPLANT
GLOVE ECLIPSE 6.5 STRL STRAW (GLOVE) ×2 IMPLANT
GLOVE EXAM NITRILE XL STR (GLOVE) IMPLANT
GOWN STRL REUS W/ TWL LRG LVL3 (GOWN DISPOSABLE) ×6 IMPLANT
GOWN STRL REUS W/ TWL XL LVL3 (GOWN DISPOSABLE) IMPLANT
GOWN STRL REUS W/TWL 2XL LVL3 (GOWN DISPOSABLE) IMPLANT
KIT BASIN OR (CUSTOM PROCEDURE TRAY) ×2 IMPLANT
KIT TURNOVER KIT B (KITS) ×2 IMPLANT
NDL HYPO 25X1 1.5 SAFETY (NEEDLE) ×1 IMPLANT
NDL SPNL 22GX3.5 QUINCKE BK (NEEDLE) ×1 IMPLANT
NEEDLE HYPO 25X1 1.5 SAFETY (NEEDLE) ×2 IMPLANT
NEEDLE SPNL 22GX3.5 QUINCKE BK (NEEDLE) ×2 IMPLANT
NS IRRIG 1000ML POUR BTL (IV SOLUTION) ×2 IMPLANT
PACK LAMINECTOMY NEURO (CUSTOM PROCEDURE TRAY) ×2 IMPLANT
PAD ARMBOARD POSITIONER FOAM (MISCELLANEOUS) ×6 IMPLANT
PLATE ACP 1-LEVEL1.6V22 (Plate) ×1 IMPLANT
SCREW ACP ST VARI 3.5X13 (Screw) ×4 IMPLANT
SPIKE FLUID TRANSFER (MISCELLANEOUS) ×2 IMPLANT
SPONGE INTESTINAL PEANUT (DISPOSABLE) ×2 IMPLANT
SPONGE SURGIFOAM ABS GEL SZ50 (HEMOSTASIS) ×2 IMPLANT
SUT VIC AB 0 CT1 27XBRD ANTBC (SUTURE) IMPLANT
SUT VIC AB 3-0 SH 8-18 (SUTURE) ×2 IMPLANT
TOWEL GREEN STERILE (TOWEL DISPOSABLE) ×2 IMPLANT
TOWEL GREEN STERILE FF (TOWEL DISPOSABLE) ×2 IMPLANT
WATER STERILE IRR 1000ML POUR (IV SOLUTION) ×2 IMPLANT

## 2023-10-09 NOTE — Transfer of Care (Signed)
 Immediate Anesthesia Transfer of Care Note  Patient: Marisa [name]  Procedure(s) Performed: ANTERIOR CERVICAL DECOMPRESSION/DISCECTOMY FUSION 1 LEVEL (Spine Cervical)  Patient Location: PACU  Anesthesia Type:General  Level of Consciousness: awake  Airway & Oxygen Therapy: Patient Spontanous Breathing and Patient connected to face mask oxygen  Post-op Assessment: Report given to RN and Post -op Vital signs reviewed and stable  Post vital signs: Reviewed and stable  Last Vitals:  Vitals Value Taken Time  BP 130/90 10/09/23 16:45  Temp    Pulse 101 10/09/23 16:48  Resp 21 10/09/23 16:48  SpO2 97 % 10/09/23 16:48  Vitals shown include unfiled device data.  Last Pain:  Vitals:   10/09/23 1017  TempSrc:   PainSc: 10-Worst pain ever         Complications: No notable events documented.

## 2023-10-09 NOTE — H&P (Signed)
 Marisa [name] is someone I saw last in 2021.  At that time, she had tingling and numbness in the upper extremities.  She also had a herniated disc at C4-5, a fairly good size bulge at C3-4.  I proposed then to take her to the operating room for decompression.  She also had a kyphotic deformity at that time.  I had seen it progress from a CT that had been done earlier and felt that she would get worse if we did not do anything.  She returns today and states that out of fear she probably refused to get another MRI in 2021.  At this point, however, she says she is ready.  She is having significant pain in the right upper extremity and along her fingers, all of them included.  She has a lot of tingling and she has numbness.  Says for the last 6 months, the left upper extremity has done the same.  She does have some problems with coordination, buttoning objects is hard, manipulating objects with her hands is also difficult.  EXAMINATION: She weighs 226 pounds.  Temperature is 98.6, blood pressure is 139/90, pulse is 82, pain is 10/10.  She is alert, oriented x4.  She answers all questions appropriately.  She has 5/5 strength in the upper and lower extremities.  She is hyperreflexic at the biceps, triceps, brachioradialis, knees, and ankles.  Romberg is positive.  She is unable to tandem walk.  Pupils equal, round, react to light.  Full extraocular movements.  Full visual fields.  Symmetric facies and symmetric facial movements.  Hearing intact to voice.  Tongue and uvula in the midline.  DATA REVIEWED: MRI shows cord signal behind the disc at C4-C5, shows a disc at C3-4 eccentric to the left side just lateral to the cord.  The rest of the neck actually looks pretty good.  She is still kyphotic.  No abnormalities in the paraspinous soft tissue.  No tumors, no infections.  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  Her gait is affected.  Her upper extremity  coordination is affected.  Her balance has been affected.  I recommend once more C3-C4, C4-C5 anterior cervical discectomy and fusion to decompress the spinal canal.  At this time, she is in agreement.  She says fear kept her from it last time and that is understandable.  We will get her in the system and try to get this done as soon as possible.

## 2023-10-09 NOTE — Anesthesia Procedure Notes (Signed)
 Procedure Name: Intubation Date/Time: 10/09/2023 2:00 PM  Performed by: Arvell Edsel HERO, CRNAPre-anesthesia Checklist: Patient identified, Emergency Drugs available, Suction available, Patient being monitored and Timeout performed Patient Re-evaluated:Patient Re-evaluated prior to induction Oxygen Delivery Method: Circle system utilized Preoxygenation: Pre-oxygenation with 100% oxygen Induction Type: IV induction Ventilation: Mask ventilation without difficulty Laryngoscope Size: Glidescope and 3 Grade View: Grade I Tube type: Oral Tube size: 7.0 mm Number of attempts: 1 Airway Equipment and Method: Video-laryngoscopy Placement Confirmation: ETT inserted through vocal cords under direct vision, positive ETCO2, breath sounds checked- equal and bilateral and CO2 detector Secured at: 22 cm Tube secured with: Tape

## 2023-10-10 ENCOUNTER — Other Ambulatory Visit (HOSPITAL_COMMUNITY): Payer: Self-pay

## 2023-10-10 DIAGNOSIS — M5001 Cervical disc disorder with myelopathy,  high cervical region: Secondary | ICD-10-CM | POA: Diagnosis not present

## 2023-10-10 LAB — GLUCOSE, CAPILLARY: Glucose-Capillary: 122 mg/dL — ABNORMAL HIGH (ref 70–99)

## 2023-10-10 MED ORDER — TIZANIDINE HCL 4 MG PO TABS
4.0000 mg | ORAL_TABLET | Freq: Four times a day (QID) | ORAL | 0 refills | Status: DC | PRN
  Filled 2023-10-10: qty 30, 8d supply, fill #0

## 2023-10-10 MED ORDER — POLYETHYLENE GLYCOL 3350 17 GM/SCOOP PO POWD
17.0000 g | Freq: Every day | ORAL | 0 refills | Status: DC
  Filled 2023-10-10: qty 238, 14d supply, fill #0

## 2023-10-10 MED ORDER — OXYCODONE-ACETAMINOPHEN 5-325 MG PO TABS
1.0000 | ORAL_TABLET | ORAL | 0 refills | Status: DC | PRN
  Filled 2023-10-10: qty 30, 3d supply, fill #0

## 2023-10-10 MED ORDER — MUPIROCIN 2 % EX OINT
1.0000 | TOPICAL_OINTMENT | Freq: Two times a day (BID) | CUTANEOUS | Status: DC
  Administered 2023-10-10: 1 via NASAL
  Filled 2023-10-10: qty 22

## 2023-10-10 NOTE — Progress Notes (Signed)
 Patient alert and oriented, void, ambulate. D/c instructions explain and given. Surgical site clean and dry no sign of infection.

## 2023-10-10 NOTE — Care Management (Signed)
 Patient with order to DC to home today. Unit staff to provide DME needed for home.   No HH needs identified Patient will have family/ friends provide transportation home. No other TOC needs identified for DC

## 2023-10-10 NOTE — Evaluation (Signed)
 Occupational Therapy Evaluation Patient Details Name: Marisa [name] MRN: [id number] DOB: Oct 08, 1975 Today's Date: 10/10/2023   History of Present Illness   Pt is a 48 y/o F s/p C3-4, C4-5 ACDF. PMH includes non insulin  dependent DM, GERD, HLD, multinodular thyroid , MDD, GAD, PTSD, migraines, OSA not on CPAP, asthma, obesity s/p laparoscopic gastric sleeve resection, cardiac arrest     Clinical Impressions Pt reports having assist at baseline with ADLs and ambulates short distance without AD, lives with spouse and sister. Pt educated on compensatory strategies for ADLs and cervical precautions, pt verbalizes and demo's understanding. Pt performing ADLs without assist, bed mobility via log roll, and mod I for transfers without AD. Pt educated on use of 3in1 as shower seat for home. Pt presenting with impairments listed below, will follow acutely. Anticipate no OT follow up needs at d/c.      If plan is discharge home, recommend the following:   A little help with walking and/or transfers;A little help with bathing/dressing/bathroom;Assist for transportation;Assistance with cooking/housework     Functional Status Assessment   Patient has had a recent decline in their functional status and demonstrates the ability to make significant improvements in function in a reasonable and predictable amount of time.     Equipment Recommendations   BSC/3in1     Recommendations for Other Services         Precautions/Restrictions   Precautions Precautions: Cervical Precaution Booklet Issued: Yes (comment) Recall of Precautions/Restrictions: Intact Precaution/Restrictions Comments: no brace needed per orders Restrictions Weight Bearing Restrictions Per Provider Order: No     Mobility Bed Mobility Overal bed mobility: Modified Independent             General bed mobility comments: via log roll    Transfers Overall transfer level: Modified independent                         Balance Overall balance assessment: Mild deficits observed, not formally tested                                         ADL either performed or assessed with clinical judgement   ADL Overall ADL's : At baseline                                       General ADL Comments: perfroms ADLs using compensatory strategy without assist     Vision   Vision Assessment?: No apparent visual deficits     Perception Perception: Not tested       Praxis Praxis: Not tested       Pertinent Vitals/Pain Pain Assessment Pain Assessment: Faces Pain Score: 2  Faces Pain Scale: Hurts a little bit Pain Location: neck/incision Pain Descriptors / Indicators: Discomfort Pain Intervention(s): Limited activity within patient's tolerance, Monitored during session, Repositioned     Extremity/Trunk Assessment Upper Extremity Assessment Upper Extremity Assessment: Overall WFL for tasks assessed (reports some numbness in hand but much improved from before surgery)   Lower Extremity Assessment Lower Extremity Assessment: Defer to PT evaluation   Cervical / Trunk Assessment Cervical / Trunk Assessment: Neck Surgery   Communication Communication Communication: No apparent difficulties   Cognition Arousal: Alert Behavior During Therapy: WFL for tasks assessed/performed Cognition: No apparent impairments  Following commands: Intact       Cueing  General Comments   Cueing Techniques: Verbal cues  VSS on RA   Exercises     Shoulder Instructions      Home Living Family/patient expects to be discharged to:: Private residence Living Arrangements: Spouse/significant other;Other relatives (sister) Available Help at Discharge: Family;Available 24 hours/day Type of Home: House Home Access: Stairs to enter [employer] of Steps: 2   Home Layout: One level     Bathroom Shower/Tub:  Tub/shower unit         Home Equipment: None          Prior Functioning/Environment Prior Level of Function : Needs assist             Mobility Comments: ambulatory without AD, uses electric scooter when out shopping, reports hx falls ADLs Comments: min A for bathing/dressing    OT Problem List: Decreased strength;Decreased range of motion;Impaired balance (sitting and/or standing);Decreased activity tolerance   OT Treatment/Interventions: Self-care/ADL training;Therapeutic exercise;Energy conservation;DME and/or AE instruction;Therapeutic activities;Patient/family education;Balance training      OT Goals(Current goals can be found in the care plan section)   Acute Rehab OT Goals Patient Stated Goal: none stated OT Goal Formulation: With patient Time For Goal Achievement: 10/24/23 Potential to Achieve Goals: Good   OT Frequency:  Min 2X/week    Co-evaluation              AM-PAC OT 6 Clicks Daily Activity     Outcome Measure Help from another person eating meals?: None Help from another person taking care of personal grooming?: None Help from another person toileting, which includes using toliet, bedpan, or urinal?: None Help from another person bathing (including washing, rinsing, drying)?: A Little Help from another person to put on and taking off regular upper body clothing?: None Help from another person to put on and taking off regular lower body clothing?: None 6 Click Score: 23   End of Session Nurse Communication: Mobility status  Activity Tolerance: Patient tolerated treatment well Patient left: in bed;with call bell/phone within reach  OT Visit Diagnosis: Unsteadiness on feet (R26.81);Other abnormalities of gait and mobility (R26.89);Muscle weakness (generalized) (M62.81)                Time: 9258-9190 OT Time Calculation (min): 28 min Charges:  OT General Charges $OT Visit: 1 Visit OT Evaluation $OT Eval Low Complexity: 1 Low OT  Treatments $Self Care/Home Management : 8-22 mins  Uriah Philipson K, OTD, OTR/L SecureChat Preferred Acute Rehab [phone]   Laneta POUR Koonce 10/10/2023, 8:52 AM

## 2023-10-10 NOTE — Evaluation (Signed)
 Physical Therapy Evaluation Patient Details Name: Marisa [name] MRN: [id number] DOB: Jun 12, 1975 Today's Date: 10/10/2023  History of Present Illness  Pt is a 48 y/o F s/p C3-4, C4-5 ACDF. PMH includes non insulin  dependent DM, GERD, HLD, multinodular thyroid , MDD, GAD, PTSD, migraines, OSA not on CPAP, asthma, obesity s/p laparoscopic gastric sleeve resection, cardiac arrest   Clinical Impression  PT eval complete. Pt lives at home with family. Mod I mobility household distances without AD PTA. On eval, she demo mod I bed mobility. Supervision transfers, CG/HHA amb 250', and min assist ascend/descend 2 steps without rails. All education complete. Plan is for d/c home today. No follow up services indicated. Pt would benefit from RW for home.         If plan is discharge home, recommend the following: A little help with bathing/dressing/bathroom;Assistance with cooking/housework;Assist for transportation;Help with stairs or ramp for entrance   Can travel by private vehicle        Equipment Recommendations Rolling walker (2 wheels)  Recommendations for Other Services       Functional Status Assessment Patient has had a recent decline in their functional status and demonstrates the ability to make significant improvements in function in a reasonable and predictable amount of time.     Precautions / Restrictions Precautions Precautions: Cervical Recall of Precautions/Restrictions: Intact Restrictions Other Position/Activity Restrictions: no brace per order      Mobility  Bed Mobility Overal bed mobility: Modified Independent                  Transfers Overall transfer level: Needs assistance Equipment used: None Transfers: Sit to/from Stand Sit to Stand: Supervision                Ambulation/Gait Ambulation/Gait assistance: Contact guard assist Gait Distance (Feet): 250 Feet Assistive device: 1 person hand held assist, None Gait Pattern/deviations:  Step-through pattern Gait velocity: decreased Gait velocity interpretation: <1.31 ft/sec, indicative of household ambulator   General Gait Details: knee buckling noted with fatigue. Would benefit from RW for home.  Stairs Stairs: Yes Stairs assistance: Min assist Stair Management: No rails, Forwards, Step to pattern Number of Stairs: 3 General stair comments: wall support on L, HHA on R  Wheelchair Mobility     Tilt Bed    Modified Rankin (Stroke Patients Only)       Balance Overall balance assessment: Mild deficits observed, not formally tested                                           Pertinent Vitals/Pain Pain Assessment Pain Assessment: 0-10 Pain Score: 2  Pain Location: neck/incision Pain Descriptors / Indicators: Discomfort Pain Intervention(s): Monitored during session    Home Living Family/patient expects to be discharged to:: Private residence Living Arrangements: Spouse/significant other;Other relatives (sister, granddaughter) Available Help at Discharge: Family;Available 24 hours/day Type of Home: House Home Access: Stairs to enter Entrance Stairs-Rails: None Entrance Stairs-Number of Steps: 2   Home Layout: One level Home Equipment: None      Prior Function Prior Level of Function : Needs assist             Mobility Comments: ambulatory without AD, uses electric scooter when out shopping, reports hx falls ADLs Comments: min A for bathing/dressing     Extremity/Trunk Assessment   Upper Extremity Assessment Upper Extremity Assessment: Overall WFL for tasks assessed  Lower Extremity Assessment Lower Extremity Assessment: Overall WFL for tasks assessed    Cervical / Trunk Assessment Cervical / Trunk Assessment: Neck Surgery  Communication   Communication Communication: No apparent difficulties    Cognition Arousal: Alert Behavior During Therapy: WFL for tasks assessed/performed   PT - Cognitive impairments: No  apparent impairments                         Following commands: Intact       Cueing Cueing Techniques: Verbal cues     General Comments General comments (skin integrity, edema, etc.): VSS on RA    Exercises     Assessment/Plan    PT Assessment Patient does not need any further PT services  PT Problem List         PT Treatment Interventions      PT Goals (Current goals can be found in the Care Plan section)  Acute Rehab PT Goals Patient Stated Goal: home PT Goal Formulation: All assessment and education complete, DC therapy    Frequency       Co-evaluation               AM-PAC PT 6 Clicks Mobility  Outcome Measure Help needed turning from your back to your side while in a flat bed without using bedrails?: None Help needed moving from lying on your back to sitting on the side of a flat bed without using bedrails?: None Help needed moving to and from a bed to a chair (including a wheelchair)?: A Little Help needed standing up from a chair using your arms (e.g., wheelchair or bedside chair)?: A Little Help needed to walk in hospital room?: A Little Help needed climbing 3-5 steps with a railing? : A Little 6 Click Score: 20    End of Session Equipment Utilized During Treatment: Gait belt Activity Tolerance: Patient tolerated treatment well Patient left: in bed;with call bell/phone within reach;with family/visitor present Nurse Communication: Mobility status PT Visit Diagnosis: Pain;Difficulty in walking, not elsewhere classified (R26.2)    Time: 9190-9177 PT Time Calculation (min) (ACUTE ONLY): 13 min   Charges:   PT Evaluation $PT Eval Low Complexity: 1 Low   PT General Charges $$ ACUTE PT VISIT: 1 Visit         Sari MATSU., PT  Office # [phone]   Erven Sari Shaker 10/10/2023, 10:22 AM

## 2023-10-10 NOTE — Discharge Summary (Signed)
 Physician Discharge Summary     Providing Compassionate, Quality Care - Together   Patient ID: Marisa [name] MRN: [id number] DOB/AGE: December 23, 1975 48 y.o.  Admit date: 10/09/2023 Discharge date: 10/10/2023  Admission Diagnoses: Cervical cord compression with myelopathy  Discharge Diagnoses:  Principal Problem:   Cervical cord compression with myelopathy [facility])   Discharged Condition: good  Hospital Course: Patient underwent a C3-4, C4-5 ACDF by Dr. Gillie on 10/09/2023. She was admitted to 3C04 following recovery from anesthesia in the PACU. Her postoperative course has been uncomplicated. She has worked with both physical and occupational therapies who feel the patient is ready for discharge home. She is ambulating independently and without difficulty. She is tolerating a normal diet. She is not having any bowel or bladder dysfunction. Her pain is well-controlled with oral pain medication. She is ready for discharge home.   Consults: PT/OT/TOC  Significant Diagnostic Studies: radiology: DG Cervical Spine 2 or 3 views Result Date: 10/09/2023 CLINICAL DATA:  Elective surgery. EXAM: CERVICAL SPINE - 2-3 VIEW COMPARISON:  None Available. FINDINGS: Three lateral spot views of the cervical spine submitted from the operating room. Image 1 demonstrates surgical instrument projecting anteriorly at the C5 level. Image 2 demonstrates surgical instrument projecting over the C3-C4 disc space. Image 3 demonstrates anterior fusion C4-C5 with interbody spacer. IMPRESSION: Intraoperative spot views during cervical spine surgery. Electronically Signed   By: Andrea Gasman M.D.   On: 10/09/2023 18:01     Treatments: surgery: C3-4, C4-5 ACDF  Discharge Exam: Blood pressure 115/81, pulse 74, temperature 97.6 F (36.4 C), temperature source Oral, resp. rate 20, height 5' 5 (1.651 m), weight 102.1 kg, last menstrual period 11/27/2012, SpO2 100%.  Alert and oriented x 4 PERRLA CN II-XII  grossly intact MAE, Strength intact, pain in arms improved. Still with some numbness. Incision is covered with Honeycomb dressing and Steri Strips; Dressing is clean, dry, and intact   Disposition: Discharge disposition: 01-Home or Self Care       Discharge Instructions     Call MD for:  difficulty breathing, headache or visual disturbances   Complete by: As directed    Call MD for:  hives   Complete by: As directed    Call MD for:  persistant nausea and vomiting   Complete by: As directed    Call MD for:  redness, tenderness, or signs of infection (pain, swelling, redness, odor or green/yellow discharge around incision site)   Complete by: As directed    Call MD for:  severe uncontrolled pain   Complete by: As directed    Diet - low sodium heart healthy   Complete by: As directed    Increase activity slowly   Complete by: As directed    No dressing needed   Complete by: As directed       Allergies as of 10/10/2023       Reactions   Asa [aspirin] Anaphylaxis, Hives   Hives, chest tightness   Mushroom Extract Complex (obsolete) Anaphylaxis, Swelling, Other (See Comments)   Reaction:  Eye swelling   Penicillins Anaphylaxis   Anaphylaxis ~48 yo necessitating ED visit (took pink thick liquid) - cannot recall taking any cephalosporins   Shellfish Allergy Anaphylaxis   Triamcinolone  Other (See Comments)   Skin issues    Zoloft  [sertraline ]    Jittery feeling        Medication List     TAKE these medications    Accu-Chek Guide Test test strip Generic drug: glucose blood Use  as instructed. Check blood glucose level by fingerstick 1-2 times per day. E11.65   Accu-Chek Guide w/Device Kit Check blood sugars daily   Accu-Chek Softclix Lancets lancets Use as instructed   albuterol  (2.5 MG/3ML) 0.083% nebulizer solution Commonly known as: PROVENTIL  Take 3 mLs (2.5 mg total) by nebulization every 6 (six) hours as needed for wheezing or shortness of breath.    albuterol  108 (90 Base) MCG/ACT inhaler Commonly known as: Proventil  HFA Inhale 2 puffs into the lungs every 6 (six) hours as needed for wheezing or shortness of breath.   atorvastatin  20 MG tablet Commonly known as: LIPITOR Take 1 tablet (20 mg total) by mouth daily.   BARIATRIC MULTIVITAMINS/IRON PO Take 1 tablet by mouth daily.   busPIRone  15 MG tablet Commonly known as: BUSPAR  Take 1 tablet (15 mg total) by mouth 2 (two) times daily.   dicyclomine  10 MG capsule Commonly known as: BENTYL  Take 1 capsule (10 mg total) by mouth 3 (three) times daily before meals.   DULoxetine  HCl 40 MG Cpep Take 1 capsule (40 mg total) by mouth daily.   EPINEPHrine  0.3 mg/0.3 mL Soaj injection Commonly known as: EPI-PEN Inject 0.3 mg into the muscle as needed for anaphylaxis.   gabapentin  300 MG capsule Commonly known as: NEURONTIN  Take 1 capsule (300 mg total) by mouth 2 (two) times daily. What changed:  when to take this reasons to take this   hydrOXYzine  50 MG capsule Commonly known as: VISTARIL  Take 1 capsule (50 mg total) by mouth every 8 (eight) hours as needed.   ondansetron  4 MG tablet Commonly known as: ZOFRAN  Take 1 tablet (4 mg total) by mouth every 6 (six) hours as needed for nausea.   oxyCODONE -acetaminophen  5-325 MG tablet Commonly known as: Percocet Take 1-2 tablets by mouth every 4 (four) hours as needed for severe pain (pain score 7-10) (Postoperative pain).   polyethylene glycol 17 g packet Commonly known as: MiraLax  Take 17 g by mouth daily.   potassium chloride  SA 20 MEQ tablet Commonly known as: KLOR-CON  M Take 2 tablets (40 mEq total) by mouth daily.   Semaglutide (0.25 or 0.5MG /DOS) 2 MG/3ML Sopn Inject 0.25 mg into the skin once a week.   SUMAtriptan  100 MG tablet Commonly known as: Imitrex  Take 1 tablet at the start of the headache.   tiZANidine  4 MG tablet Commonly known as: Zanaflex  Take 1 tablet (4 mg total) by mouth every 6 (six) hours as  needed for muscle spasms.   topiramate  25 MG tablet Commonly known as: TOPAMAX  TAKE 1 TABLET BY MOUTH AT BEDTIME TO  DECREASE  MIGRAINE  FREQUENCY   Vitamin D  (Ergocalciferol ) 1.25 MG (50000 UNIT) Caps capsule Commonly known as: DRISDOL  Take 1 capsule (50,000 Units total) by mouth every 7 (seven) days. What changed: additional instructions               Discharge Care Instructions  (From admission, onward)           Start     Ordered   10/10/23 0000  No dressing needed        10/10/23 0840            Follow-up Information     Gillie Duncans, MD. Go on 10/26/2023.   Specialty: Neurosurgery Why: First post op appointment with x-rays is on 10/26/2023 at 10:45 AM. Contact information: [address] [place] [address] [phone]  Signed: Gerard Beck, DNP, AGNP-C Nurse Practitioner  [facility] [address], [address], [place], [place] [address] P: [phone]    F: [phone]  10/10/2023, 8:41 AM

## 2023-10-10 NOTE — Anesthesia Postprocedure Evaluation (Signed)
 Anesthesia Post Note  Patient: Marisa [name]  Procedure(s) Performed: ANTERIOR CERVICAL DECOMPRESSION/DISCECTOMY FUSION 1 LEVEL (Spine Cervical)     Patient location during evaluation: PACU Anesthesia Type: General Level of consciousness: awake Pain management: pain level controlled Vital Signs Assessment: post-procedure vital signs reviewed and stable Respiratory status: spontaneous breathing, nonlabored ventilation and respiratory function stable Cardiovascular status: blood pressure returned to baseline and stable Postop Assessment: no apparent nausea or vomiting Anesthetic complications: no   No notable events documented.  Last Vitals:  Vitals:   10/09/23 2257 10/10/23 0317  BP: 111/77 116/75  Pulse: 91 69  Resp: 18 18  Temp: 36.8 C 36.6 C  SpO2: 96% 98%    Last Pain:  Vitals:   10/10/23 0622  TempSrc:   PainSc: 7                  Trease B[place]ner P Ameilia Rattan

## 2023-10-10 NOTE — Discharge Instructions (Signed)
 Wound Care Your incision is covered with Dermabond.  This will start to peel off on its own. Do not pull on the Dermabond. Do not put any creams, lotions, or ointments on incision. You are fine to shower. Let water  run over incision and pat dry.  Activity Walk each and every day, increasing distance each day. No lifting greater than 5 lbs.  Avoid excessive neck motion. No driving for 2 weeks; may ride as a passenger locally.  Diet Resume your normal diet.   Return to Work Will be discussed at your follow up appointment.  Call Your Doctor If Any of These Occur Redness, drainage, or swelling at the wound.  Temperature greater than 101 degrees. Severe pain not relieved by pain medication. Incision starts to come apart.  Follow Up Appt Call [phone] today for appointment in 2-3 weeks if you don't already have one or for any problems.

## 2023-10-11 LAB — GLUCOSE, CAPILLARY: Glucose-Capillary: 119 mg/dL — ABNORMAL HIGH (ref 70–99)

## 2023-10-13 ENCOUNTER — Encounter (HOSPITAL_COMMUNITY): Payer: Self-pay | Admitting: Neurosurgery

## 2023-10-13 ENCOUNTER — Ambulatory Visit: Payer: Self-pay | Admitting: Family

## 2023-10-14 ENCOUNTER — Other Ambulatory Visit: Payer: Self-pay | Admitting: Internal Medicine

## 2023-10-15 NOTE — Telephone Encounter (Signed)
 Requested medications are due for refill today.  unsure  Requested medications are on the active medications list.  yes  Last refill. 12/18/2022 #60 1 rf  Future visit scheduled.   Yes - next year  Notes to clinic.  Potassium levels are normal and no discussion at recent appt regarding potassium.    Requested Prescriptions  Pending Prescriptions Disp Refills   potassium chloride  SA (KLOR-CON  M) 20 MEQ tablet [Pharmacy Med Name: Potassium Chloride  Crys ER 20 MEQ Oral Tablet Extended Release] 60 tablet 0    Sig: Take 2 tablets by mouth once daily     Endocrinology:  Minerals - Potassium Supplementation Passed - 10/15/2023  4:51 PM      Passed - K in normal range and within 360 days    Potassium  Date Value Ref Range Status  10/06/2023 3.7 3.5 - 5.1 mmol/L Final         Passed - Cr in normal range and within 360 days    Creat  Date Value Ref Range Status  09/29/2013 0.60 0.50 - 1.10 mg/dL Final   Creatinine, Ser  Date Value Ref Range Status  10/06/2023 0.72 0.44 - 1.00 mg/dL Final   Creatinine, Urine  Date Value Ref Range Status  04/16/2016 CANCELED 20 - 320 mg/dL     Comment:    Test not performed, no urine was received.    Result canceled by the ancillary          Passed - Valid encounter within last 12 months    Recent Outpatient Visits           1 month ago Annual physical exam   [facility], Amy J, NP   2 months ago Encounter to establish care   [facility], Amy J, NP   5 months ago Cervical radiculopathy   [facility] [place]. [facility] Theotis Haze ORN, NP   10 months ago Diabetes mellitus type 2, diet-controlled [facility])    [facility] [place]. [facility] Vicci Sober B, MD   1 year ago Type 2 diabetes mellitus with diabetic neuropathy, without long-term current use of insulin  [facility])   [facility] [place]. [facility] Brien Belvie BRAVO, MD

## 2023-10-16 NOTE — Telephone Encounter (Signed)
 Schedule appointment. Potassium Chloride  last prescribed (12/18/2022  3:00 PM EDT) from Vicci Barnie NOVAK, MD.

## 2023-10-21 NOTE — Telephone Encounter (Signed)
 Potassium Chloride  last prescribed (12/18/2022  3:00 PM EDT) by Vicci Barnie NOVAK, MD. Schedule appointment.

## 2023-10-21 NOTE — Telephone Encounter (Signed)
 Appt scheduled

## 2023-11-10 NOTE — Op Note (Signed)
 10/09/2023  10:43 AM  PATIENT:  Marisa [name]  48 y.o. female with cervical stenosis At 66/5. 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  BLOOD ADMINISTERED:none  CELL SAVER GIVEN:none  COUNT:per nursing  DRAINS: not used   SPECIMEN:  No Specimen  DICTATION: Mrs. Deeley was taken to the operating room, intubated, and placed under general anesthesia without difficulty. She was positioned supine with her head in slight extension on a horseshoe headrest. The neck was prepped and draped in a sterile manner. I infiltrated 5 cc's 1/2%lidocaine /1:200,000 strength epinephrine  into the planned incision starting from the midline to the medial border of the left sternocleidomastoid muscle. I opened the incision with a 10 blade and dissected sharply through soft tissue to the platysma. I dissected in the plane superior to the platysma both rostrally and caudally. I then opened the platysma in a horizontal fashion with Metzenbaum scissors, and dissected in the inferior plane rostrally and caudally. With both blunt and sharp technique I created an avascular corridor to the cervical spine. I placed a spinal needle(s) in the disc space at 3/4 . I then reflected the longus colli from C4 to C5 and placed self retaining retractors. I opened the disc space(s) at C4/5 with a 15 blade. I removed disc with curettes, Kerrison punches, and the drill. Using the drill I removed osteophytes and prepared for the decompression.  I decompressed the spinal canal and the C5 root(s) with the  drill, Kerrison punches, and the curettes. I used the microscope to aid in microdissection. I removed the posterior longitudinal ligament to fully expose and decompress the thecal sac. I exposed the roots laterally taking down the C4/5 uncovertebral joints. With the decompression complete I moved on to the arthrodesis. I used the drill to level the surfaces of C4, and 5. I removed soft tissue to prepare the disc space and the bony surfaces. I measured the space and placed an 8mm structural allograft into the disc space.  I then placed the anterior instrumentation. I placed 2 screws in each vertebral body through the plate. I locked the screws into place. Intraoperative xray showed the graft, plate, and screws to be in good position. I irrigated the wound, achieved hemostasis, and closed the wound in layers. I approximated the platysma, and the subcuticular plane with vicryl sutures. I used Dermabond for a sterile dressing.   PLAN OF CARE: Admit for overnight observation  PATIENT DISPOSITION:  PACU - hemodynamically stable.   Delay start of Pharmacological VTE agent (>24hrs) due to surgical blood loss or risk of bleeding:  yes

## 2023-12-08 ENCOUNTER — Other Ambulatory Visit (HOSPITAL_COMMUNITY)
Admission: RE | Admit: 2023-12-08 | Discharge: 2023-12-08 | Disposition: A | Discharge status: Home / Self Care | Source: Ambulatory Visit | Attending: Family | Admitting: Family

## 2023-12-08 ENCOUNTER — Ambulatory Visit (INDEPENDENT_AMBULATORY_CARE_PROVIDER_SITE_OTHER): Admitting: Family

## 2023-12-08 ENCOUNTER — Other Ambulatory Visit: Payer: Self-pay | Admitting: Family

## 2023-12-08 ENCOUNTER — Encounter: Payer: Self-pay | Admitting: Family

## 2023-12-08 VITALS — BP 120/85 | HR 82 | Temp 98.8°F | Resp 16 | Ht 65.0 in | Wt 214.6 lb

## 2023-12-08 DIAGNOSIS — F418 Other specified anxiety disorders: Secondary | ICD-10-CM | POA: Diagnosis not present

## 2023-12-08 DIAGNOSIS — E559 Vitamin D deficiency, unspecified: Secondary | ICD-10-CM

## 2023-12-08 DIAGNOSIS — N898 Other specified noninflammatory disorders of vagina: Secondary | ICD-10-CM | POA: Diagnosis not present

## 2023-12-08 DIAGNOSIS — F419 Anxiety disorder, unspecified: Secondary | ICD-10-CM

## 2023-12-08 DIAGNOSIS — E119 Type 2 diabetes mellitus without complications: Secondary | ICD-10-CM

## 2023-12-08 DIAGNOSIS — Z124 Encounter for screening for malignant neoplasm of cervix: Secondary | ICD-10-CM

## 2023-12-08 DIAGNOSIS — E785 Hyperlipidemia, unspecified: Secondary | ICD-10-CM | POA: Diagnosis not present

## 2023-12-08 DIAGNOSIS — Z0130 Encounter for examination of blood pressure without abnormal findings: Secondary | ICD-10-CM

## 2023-12-08 DIAGNOSIS — Z800 Family history of malignant neoplasm of digestive organs: Secondary | ICD-10-CM

## 2023-12-08 MED ORDER — BUPROPION HCL ER (XL) 150 MG PO TB24: 150.0000 mg | ORAL_TABLET | Freq: Every day | ORAL | 0 refills | Status: DC

## 2023-12-08 MED ORDER — ATORVASTATIN CALCIUM 20 MG PO TABS
20.0000 mg | ORAL_TABLET | Freq: Every day | ORAL | 0 refills | Status: DC
Start: 2023-12-08 — End: 2023-12-23

## 2023-12-08 MED ORDER — HYDROXYZINE PAMOATE 50 MG PO CAPS: 50.0000 mg | ORAL_CAPSULE | Freq: Three times a day (TID) | ORAL | 1 refills | Status: AC | PRN

## 2023-12-08 NOTE — Progress Notes (Signed)
 Medication refill, needs to talk to pcp about getting tested for cancer, b/p has been running high

## 2023-12-08 NOTE — Progress Notes (Signed)
 Patient ID: Marisa [name], female    DOB: December 19, 1975  MRN: [id number]  CC: Chronic Conditions Follow-Up  Subjective: Marisa [name] is a 48 y.o. female who presents for chronic conditions follow-up.  Her concerns today include:  - Doing well on Atorvastatin , no issues/concerns.  - Reports home blood pressures mostly 120's-130's/70's-90's. She does not complain of red flag symptoms such as but not limited to chest pain, shortness of breath, worst headache of life, nausea/vomiting.  - Due for diabetic eye exam. - Due for diabetic foot exam. - Anxiety depression persisting. States doesn't feel Hydroxyzine  helping as much as she would like. Requests referral to therapist. She denies thoughts of self-harm, suicidal ideations, homicidal ideations. - Family history of liver cancer. Denies present symptoms of concern.  - Up to date on mammogram per [facility].  - Due for cervical cancer screening.  - Up to date on colon cancer screening per [facility].  - Vaginal itching. Denies additional symptoms. - Vitamin D  lab.   Patient Active Problem List   Diagnosis Date Noted   Cervical cord compression with myelopathy (HCC) 10/09/2023   Positive blood culture 12/13/2022   Bacteremia 09/16/2022   Nausea and vomiting 09/16/2022   Vaginal itching 09/15/2022   Lactic acidosis 09/15/2022   GI bleed 09/14/2022   Colitis 09/14/2022   Elevated liver enzymes 09/14/2022   Elevated MCV 09/14/2022   Hypokalemia 09/14/2022   Multinodular thyroid  03/01/2021   Solitary pulmonary nodule 02/21/2021   Frequent falls 02/21/2021   Weakness of both lower extremities 02/21/2021   Carpal tunnel syndrome of right wrist 02/21/2021   Acute constipation 02/21/2021   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   Morbid obesity (HCC) 12/24/2020   Allergic rhinitis 06/26/2020   Hyperlipidemia associated with type 2 diabetes mellitus (HCC) 04/10/2020   Chronic pain syndrome 04/09/2020   Type 2  diabetes mellitus with diabetic neuropathy, without long-term current use of insulin  (HCC) 04/09/2020   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 12/13/2019   Vitamin D  deficiency 04/05/2019   Class 2 severe obesity with serious comorbidity and body mass index (BMI) of 36.0 to 36.9 in adult 04/05/2019   Obesity (BMI 30-39.9) 03/05/2018   Chronic diarrhea    Benign neoplasm of transverse colon    Benign neoplasm of sigmoid colon    Neuropathic pain of both legs 06/04/2016   Rash and nonspecific skin eruption 07/12/2015   Dandruff 03/26/2015   Migraine variant with headache 05/09/2014   GAD (generalized anxiety disorder) 05/04/2014   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   Social anxiety disorder 05/04/2014   PTSD (post-traumatic stress disorder) 05/04/2014   Depression 03/27/2014   OSA (obstructive sleep apnea) 03/06/2014   Insomnia 03/06/2014   History of cardiac arrest    Chest pain 02/20/2014   Rectal bleeding 07/25/2013   S/P Total vaginal hysterectomy on 01/06/13 01/06/2013   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 6.7 g 2   albuterol  (PROVENTIL ) (2.5 MG/3ML) 0.083% nebulizer solution Take 3 mLs (2.5 mg total) by nebulization every 6 (six) hours as needed for wheezing or shortness of breath. 150 mL 1   Blood Glucose Monitoring Suppl (ACCU-CHEK GUIDE) w/Device  KIT Check blood sugars daily 1 kit 0   busPIRone  (BUSPAR ) 15 MG tablet Take 1 tablet (15 mg total) by mouth 2 (two) times daily. 60 tablet 3   dicyclomine  (BENTYL ) 10 MG capsule Take 1 capsule (10 mg total) by mouth 3 (three) times daily before meals. 90 capsule 1   DULoxetine  40 MG CPEP Take 1 capsule (40  mg total) by mouth daily. 90 capsule 1   EPINEPHrine  0.3 mg/0.3 mL IJ SOAJ injection Inject 0.3 mg into the muscle as needed for anaphylaxis. 1 each 1   glucose blood (ACCU-CHEK GUIDE TEST) test strip Use as instructed. Check blood glucose level by fingerstick 1-2 times per day. E11.65 100 each 12   Multiple Vitamins-Minerals (BARIATRIC MULTIVITAMINS/IRON PO) Take 1 tablet by mouth daily.     oxyCODONE -acetaminophen  (PERCOCET) 5-325 MG tablet Take 1-2 tablets by mouth every 4 (four) hours as needed for severe pain (pain score 7-10) (Postoperative pain). 30 tablet 0   polyethylene glycol powder (GLYCOLAX /MIRALAX ) 17 GM/SCOOP powder Dissolve 1 capful (17 g) in liquid and take by mouth daily. 238 g 0   potassium chloride  SA (KLOR-CON  M) 20 MEQ tablet Take 2 tablets (40 mEq total) by mouth daily. 60 tablet 1   Semaglutide ,0.25 or 0.5MG /DOS, 2 MG/3ML SOPN Inject 0.25 mg into the skin once a week. 6 mL 1   tiZANidine  (ZANAFLEX ) 4 MG tablet Take 1 tablet (4 mg total) by mouth every 6 (six) hours as needed for muscle spasms. 30 tablet 0   topiramate  (TOPAMAX ) 25 MG tablet TAKE 1 TABLET BY MOUTH AT BEDTIME TO  DECREASE  MIGRAINE  FREQUENCY 90 tablet 0   Vitamin D , Ergocalciferol , (DRISDOL ) 1.25 MG (50000 UNIT) CAPS capsule Take 1 capsule (50,000 Units total) by mouth every 7 (seven) days. (Patient taking differently: Take 50,000 Units by mouth every 7 (seven) days. Takes on Thursday) 12 capsule 0   gabapentin  (NEURONTIN ) 300 MG capsule Take 1 capsule (300 mg total) by mouth 2 (two) times daily. (Patient not taking: Reported on 12/08/2023) 180 capsule 1   ondansetron  (ZOFRAN ) 4 MG tablet Take 1 tablet (4 mg total) by mouth every 6 (six) hours as needed for nausea. (Patient not taking: Reported on 09/30/2023) 20 tablet 0   SUMAtriptan  (IMITREX ) 100 MG tablet Take 1 tablet at the start of the headache. (Patient not taking: Reported on 09/30/2023) 12 tablet 3   No current facility-administered medications on file  prior to visit.    Allergies  Allergen Reactions   Asa [Aspirin] Anaphylaxis and Hives    Hives, chest tightness    Mushroom Extract Complex (Obsolete) Anaphylaxis, Swelling and Other (See Comments)    Reaction:  Eye swelling   Penicillins Anaphylaxis    Anaphylaxis ~48 yo necessitating ED visit (took pink thick liquid) - cannot recall taking any cephalosporins   Shellfish Allergy Anaphylaxis   Triamcinolone  Other (See Comments)    Skin issues    Zoloft  [Sertraline ]     Jittery feeling    Social History   Socioeconomic History   Marital status: Married    Spouse name: Not on file   Number of children: 1   Years of education: Not on file   Highest education level: Some college, no degree  Occupational History   Occupation: disability  Tobacco Use   Smoking status: Former    Current packs/day: 0.00    Average packs/day: 0.2 packs/day for 11.0 years (2.2 ttl pk-yrs)    Types: Cigarettes    Start date:  11/18/2002    Quit date: 11/17/2013    Years since quitting: 10.0   Smokeless tobacco: Never   Tobacco comments:    2 years quit  Vaping Use   Vaping status: Never Used  Substance and Sexual Activity   Alcohol use: Yes    Alcohol/week: 4.0 - 6.0 standard drinks of alcohol    Types: 4 - 6 Glasses of wine per week   Drug use: No   Sexual activity: Yes    Birth control/protection: Surgical    Comment: Hysterectomy  Other Topics Concern   Not on file  Social History Narrative   Lives with sister   Drinks no caffeine   Right Handed    Lives in a one story home    Social Drivers of Health   Financial Resource Strain: Low Risk  (07/22/2023)   Overall Financial Resource Strain (CARDIA)    Difficulty of Paying Living Expenses: Not hard at all  Food Insecurity: No Food Insecurity (07/22/2023)   Hunger Vital Sign    Worried About Running Out of Food in the Last Year: Never true    Ran Out of Food in the Last Year: Never true  Transportation Needs: No Transportation Needs  (07/22/2023)   PRAPARE - Administrator, Civil Service (Medical): No    Lack of Transportation (Non-Medical): No  Physical Activity: Insufficiently Active (07/22/2023)   Exercise Vital Sign    Days of Exercise per Week: 2 days    Minutes of Exercise per Session: 40 min  Stress: Stress Concern Present (07/22/2023)   [employer] of Occupational Health - Occupational Stress Questionnaire    Feeling of Stress : Very much  Social Connections: Socially Integrated (07/22/2023)   Social Connection and Isolation Panel    Frequency of Communication with Friends and Family: More than three times a week    Frequency of Social Gatherings with Friends and Family: Twice a week    Attends Religious Services: More than 4 times per year    Active Member of [employer] or Organizations: Yes    Attends Engineer, structural: More than 4 times per year    Marital Status: Married  Catering manager Violence: Not At Risk (07/22/2023)   Humiliation, Afraid, Rape, and Kick questionnaire    Fear of Current or Ex-Partner: No    Emotionally Abused: No    Physically Abused: No    Sexually Abused: No    Family History  Problem Relation Age of Onset   Hypertension Mother    Diabetes Mother    Allergies Mother    Heart disease Mother    Clotting disorder Mother    Stroke Mother    Kidney disease Mother    Thyroid  disease Mother    Cancer Father    Hyperlipidemia Father    Hypertension Father    Colon cancer Father    Liver disease Father    Heart disease Maternal Grandmother    Breast cancer Maternal Grandmother 54   Schizophrenia Sister    Bipolar disorder Sister    Clotting disorder Sister    Bipolar disorder Brother    Kidney disease Brother    Bipolar disorder Sister    Pancreatic cancer Maternal Aunt    Prostate cancer Maternal Uncle    Liver cancer Maternal Grandfather    Rectal cancer Maternal Grandfather    Liver cancer Paternal Grandfather    Colon polyps Neg Hx    Esophageal  cancer Neg Hx  Stomach cancer Neg Hx     Past Surgical History:  Procedure Laterality Date   ABDOMINAL HYSTERECTOMY     partial   ANTERIOR CERVICAL DECOMP/DISCECTOMY FUSION N/A 10/09/2023   Procedure: ANTERIOR CERVICAL DECOMPRESSION/DISCECTOMY FUSION 1 LEVEL;  Surgeon: Gillie Duncans, MD;  Location: [facility];  Service: Neurosurgery;  Laterality: N/A;  ACDF - C4-C5   BIOPSY  01/22/2023   Procedure: BIOPSY;  Surgeon: Legrand Victory LITTIE DOUGLAS, MD;  Location: [facility];  Service: Gastroenterology;;   COLONOSCOPY Left 04/29/2013   Procedure: COLONOSCOPY;  Surgeon: Elsie Cree, MD;  Location: [facility];  Service: Endoscopy;  Laterality: Left;   COLONOSCOPY     COLONOSCOPY WITH PROPOFOL  N/A 08/01/2016   Procedure: COLONOSCOPY WITH PROPOFOL ;  Surgeon: Legrand Victory LITTIE DOUGLAS, MD;  Location: [facility];  Service: Gastroenterology;  Laterality: N/A;   COLONOSCOPY WITH PROPOFOL  N/A 01/22/2023   Procedure: COLONOSCOPY WITH PROPOFOL ;  Surgeon: Legrand Victory LITTIE DOUGLAS, MD;  Location: [facility];  Service: Gastroenterology;  Laterality: N/A;   ECTOPIC PREGNANCY SURGERY     ESOPHAGOGASTRODUODENOSCOPY (EGD) WITH PROPOFOL  N/A 11/10/2018   Procedure: ESOPHAGOGASTRODUODENOSCOPY (EGD) WITH PROPOFOL ;  Surgeon: Legrand Victory LITTIE DOUGLAS, MD;  Location: [facility];  Service: Gastroenterology;  Laterality: N/A;   EVALUATION UNDER ANESTHESIA WITH FISTULECTOMY N/A 04/20/2014   Procedure: EXAM UNDER ANESTHESIA ;  Surgeon: Bernarda Ned, MD;  Location[facility];  Service: General;  Laterality: N/A;   FLEXIBLE SIGMOIDOSCOPY N/A 11/09/2013   Procedure: FLEXIBLE SIGMOIDOSCOPY;  Surgeon: Elsie Cree, MD;  Location: [facility];  Service: Endoscopy;  Laterality: N/A;   fupa removal      LAPAROSCOPIC CHOLECYSTECTOMY  2005   LAPAROSCOPIC GASTRIC SLEEVE RESECTION N/A 12/24/2020   Procedure: LAPAROSCOPIC GASTRIC SLEEVE RESECTION;  Surgeon: Signe Mitzie LABOR, MD;  Location: [facility];  Service: General;  Laterality: N/A;    REFRACTIVE SURGERY     SPHINCTEROTOMY N/A 04/20/2014   Procedure:  LATERAL INTERNAL SPHINCTEROTOMY;  Surgeon: Bernarda Ned, MD;  Location: [facility];  Service: General;  Laterality: N/A;   TRANSTHORACIC ECHOCARDIOGRAM  12/30/2012   mild LVH/  ef 55-60%   UNILATERAL SALPINGECTOMY  2009   laparotomy left salpingectomy-- ectopic preg.   UPPER GASTROINTESTINAL ENDOSCOPY     UPPER GI ENDOSCOPY N/A 12/24/2020   Procedure: UPPER GI ENDOSCOPY;  Surgeon: Signe Mitzie LABOR, MD;  Location: [facility];  Service: General;  Laterality: N/A;   VAGINAL HYSTERECTOMY N/A 01/06/2013   Procedure: HYSTERECTOMY VAGINAL;  Surgeon: Gloris LABOR Hugger, MD;  Location: [facility];  Service: Gynecology;  Laterality: N/A;    ROS: Review of Systems Negative except as stated above  PHYSICAL EXAM: BP 120/85   Pulse 82   Temp 98.8 F (37.1 C) (Oral)   Resp 16   Ht 5' 5 (1.651 m)   Wt 214 lb 9.6 oz (97.3 kg)   LMP 11/27/2012   SpO2 98%   BMI 35.71 kg/m   Physical Exam HENT:     Head: Normocephalic and atraumatic.     Nose: Nose normal.     Mouth/Throat:     Mouth: Mucous membranes are moist.     Pharynx: Oropharynx is clear.  Eyes:     Extraocular Movements: Extraocular movements intact.     Conjunctiva/sclera: Conjunctivae normal.     Pupils: Pupils are equal, round, and reactive to light.  Cardiovascular:     Rate and Rhythm: Normal rate and regular rhythm.     Pulses: Normal pulses.     Heart  sounds: Normal heart sounds.  Pulmonary:     Effort: Pulmonary effort is normal.     Breath sounds: Normal breath sounds.  Abdominal:     General: Bowel sounds are normal.     Palpations: Abdomen is soft.  Musculoskeletal:        General: Normal range of motion.     Cervical back: Normal range of motion and neck supple.  Neurological:     General: No focal deficit present.     Mental Status: She is alert and oriented to person, place, and time.  Psychiatric:        Mood and Affect: Mood  normal.        Behavior: Behavior normal.    ASSESSMENT AND PLAN: 1. Hyperlipidemia, unspecified hyperlipidemia type (Primary) - Continue Atorvastatin  as prescribed. Counseled on medication adherence/adverse effects.  - Routine screening.  - Follow-up with primary provider as scheduled.  - atorvastatin  (LIPITOR) 20 MG tablet; Take 1 tablet (20 mg total) by mouth daily.  Dispense: 90 tablet; Refill: 0 - Lipid panel  2. Blood pressure check - Blood pressure normal today in office.  - Follow-up with primary provider as scheduled.   3. Diabetic eye exam [facility]) - Referral to Ophthalmology for evaluation/management. - Ambulatory referral to Ophthalmology  4. Encounter for diabetic foot exam [facility]) - Referral to Podiatry for evaluation/management.  - Ambulatory referral to Podiatry  5. Anxiety and depression - Patient denies thoughts of self-harm, suicidal ideations, homicidal ideations. - Continue Hydroxyzine  as prescribed. Counseled on medication adherence/adverse effects. - Trial Bupropion XL as prescribed. Counseled on medication adherence/adverse effects.  - Referral to Psychiatry for evaluation/management.  - Follow-up with primary provider in 4 weeks or sooner if needed. - buPROPion (WELLBUTRIN XL) 150 MG 24 hr tablet; Take 1 tablet (150 mg total) by mouth daily.  Dispense: 90 tablet; Refill: 0 - hydrOXYzine  (VISTARIL ) 50 MG capsule; Take 1 capsule (50 mg total) by mouth every 8 (eight) hours as needed.  Dispense: 90 capsule; Refill: 1 - Ambulatory referral to Psychiatry  6. Family history of liver cancer - Routine screening.  - Referral to Hematology / Oncology for evaluation/management. - Ambulatory referral to Hematology / Oncology - CMP14+EGFR  7. Cervical cancer screening - Referral to Gynecology for evaluation/management.  - Ambulatory referral to Gynecology  8. Vaginal itching - Routine screening.  - Cervicovaginal ancillary only  9. Vitamin D  deficiency -  Routine screening.  - Vitamin D , 25-hydroxy   Patient was given the opportunity to ask questions.  Patient verbalized understanding of the plan and was able to repeat key elements of the plan. Patient was given clear instructions to go to Emergency Department or return to medical center if symptoms don't improve, worsen, or new problems develop.The patient verbalized understanding.   Orders Placed This Encounter  Procedures   Vitamin D , 25-hydroxy   CMP14+EGFR   Lipid panel   Ambulatory referral to Hematology / Oncology   Ambulatory referral to Ophthalmology   Ambulatory referral to Podiatry   Ambulatory referral to Psychiatry   Ambulatory referral to Gynecology     Requested Prescriptions   Signed Prescriptions Disp Refills   buPROPion (WELLBUTRIN XL) 150 MG 24 hr tablet 90 tablet 0    Sig: Take 1 tablet (150 mg total) by mouth daily.   hydrOXYzine  (VISTARIL ) 50 MG capsule 90 capsule 1    Sig: Take 1 capsule (50 mg total) by mouth every 8 (eight) hours as needed.   atorvastatin  (LIPITOR) 20 MG tablet  90 tablet 0    Sig: Take 1 tablet (20 mg total) by mouth daily.    Return in about 3 months (around 03/09/2024) for Follow-Up or next available chronic conditions.  Greig JINNY Drones, NP

## 2023-12-09 ENCOUNTER — Ambulatory Visit: Payer: Self-pay | Admitting: Family

## 2023-12-09 DIAGNOSIS — E559 Vitamin D deficiency, unspecified: Secondary | ICD-10-CM

## 2023-12-09 DIAGNOSIS — E785 Hyperlipidemia, unspecified: Secondary | ICD-10-CM

## 2023-12-09 DIAGNOSIS — R748 Abnormal levels of other serum enzymes: Secondary | ICD-10-CM

## 2023-12-09 LAB — VITAMIN D 25 HYDROXY (VIT D DEFICIENCY, FRACTURES): Vit D, 25-Hydroxy: 18.8 ng/mL — ABNORMAL LOW (ref 30.0–100.0)

## 2023-12-09 LAB — CERVICOVAGINAL ANCILLARY ONLY
Bacterial Vaginitis (gardnerella): NEGATIVE
Candida Glabrata: NEGATIVE
Candida Vaginitis: NEGATIVE
Chlamydia: NEGATIVE
Comment: NEGATIVE
Comment: NEGATIVE
Comment: NEGATIVE
Comment: NEGATIVE
Comment: NEGATIVE
Comment: NORMAL
Neisseria Gonorrhea: NEGATIVE
Trichomonas: NEGATIVE

## 2023-12-09 LAB — COMPREHENSIVE METABOLIC PANEL WITH GFR
ALT: 81 IU/L — ABNORMAL HIGH (ref 0–32)
AST: 65 IU/L — ABNORMAL HIGH (ref 0–40)
Albumin: 4.5 g/dL (ref 3.9–4.9)
Alkaline Phosphatase: 80 IU/L (ref 41–116)
BUN/Creatinine Ratio: 13 (ref 9–23)
BUN: 8 mg/dL (ref 6–24)
Bilirubin Total: 0.4 mg/dL (ref 0.0–1.2)
CO2: 23 mmol/L (ref 20–29)
Calcium: 10 mg/dL (ref 8.7–10.2)
Chloride: 106 mmol/L (ref 96–106)
Creatinine, Ser: 0.64 mg/dL (ref 0.57–1.00)
Globulin, Total: 3.1 g/dL (ref 1.5–4.5)
Glucose: 87 mg/dL (ref 70–99)
Potassium: 4.2 mmol/L (ref 3.5–5.2)
Sodium: 142 mmol/L (ref 134–144)
Total Protein: 7.6 g/dL (ref 6.0–8.5)
eGFR: 108 mL/min/1.73 (ref >59)

## 2023-12-09 LAB — LIPID PANEL W/O CHOL/HDL RATIO
Cholesterol, Total: 206 mg/dL — ABNORMAL HIGH (ref 100–199)
HDL: 40 mg/dL (ref >39)
LDL Chol Calc (NIH): 138 mg/dL — ABNORMAL HIGH (ref 0–99)
Triglycerides: 155 mg/dL — ABNORMAL HIGH (ref 0–149)
VLDL Cholesterol Cal: 28 mg/dL (ref 5–40)

## 2023-12-09 MED ORDER — ATORVASTATIN CALCIUM 40 MG PO TABS: 40.0000 mg | ORAL_TABLET | Freq: Every day | ORAL | 0 refills | Status: AC

## 2023-12-09 MED ORDER — VITAMIN D (ERGOCALCIFEROL) 1.25 MG (50000 UNIT) PO CAPS: 50000.0000 [IU] | ORAL_CAPSULE | ORAL | 0 refills | Status: AC

## 2023-12-21 ENCOUNTER — Encounter: Payer: Self-pay | Admitting: Radiology

## 2023-12-23 ENCOUNTER — Inpatient Hospital Stay

## 2023-12-23 ENCOUNTER — Telehealth: Payer: Self-pay

## 2023-12-23 ENCOUNTER — Inpatient Hospital Stay: Attending: Nurse Practitioner | Admitting: Nurse Practitioner

## 2023-12-23 ENCOUNTER — Encounter: Payer: Self-pay | Admitting: Nurse Practitioner

## 2023-12-23 VITALS — BP 128/93 | HR 94 | Temp 98.2°F | Resp 16 | Wt 215.2 lb

## 2023-12-23 DIAGNOSIS — Z800 Family history of malignant neoplasm of digestive organs: Secondary | ICD-10-CM | POA: Diagnosis present

## 2023-12-23 DIAGNOSIS — Z803 Family history of malignant neoplasm of breast: Secondary | ICD-10-CM | POA: Diagnosis not present

## 2023-12-23 DIAGNOSIS — R7989 Other specified abnormal findings of blood chemistry: Secondary | ICD-10-CM | POA: Insufficient documentation

## 2023-12-23 NOTE — Telephone Encounter (Signed)
 Copied from CRM [id number]. Topic: Clinical - Medication Question >> Dec 23, 2023  2:44 PM Marisa [name] wrote: Reason for CRM: Patient called regarding the [facility] - she would like it increased to the 0.5MG  because that's the dosage that help her. She would like it sen to the [employer] [id number] - [place], [place] - [address]

## 2023-12-23 NOTE — Progress Notes (Addendum)
 [facility]   Telephone:(336) [phone] Fax:(336) [phone]   Clinic New Consult Note   Patient Care Team: Lorren Greig PARAS, NP as PCP - General (Nurse Practitioner) Tobie Tonita POUR, DO as Consulting Physician (Neurology) Legrand Victory LITTIE DOUGLAS, MD as Consulting Physician (Gastroenterology) 12/23/2023  CHIEF COMPLAINTS/PURPOSE OF CONSULTATION:  Family history of cancer, referred by PCP Greig Gravely, NP  History of Present Illness   Marisa [name] is a 48 year old female with PMH including asthma, spinal stenosis, ectopic pregnancy s/p partial hysterectomy, anxiety, pre-DM, vitamin D  deficiency, and elevated LFTs is here because of family history of cancer.  She herself has never been diagnosed with a cancer and is up-to-date on age-appropriate screenings.  Socially she is married with 2 children, not currently working.  Independent with ADLs.  Denies tobacco or drug use.  She was drinking alcohol heavily 1 bottle of wine per day x 3 years but quit 3 months ago.  Family history significant for father with metastatic colon cancer to liver, mother had pancreatic and cervical, maternal grandmother had liver, paternal grandmother had breast, and a maternal great grandmother had cancer of unknown type.    Today she presents by herself, feeling well in general with intermittent fatigue.  She is functional without difficulties.  She has had 10 pounds intentional weight loss on Ozempic .  Has some blood in her stool she attributes to hemorrhoids but no change in bowel habits.  Denies other bleeding, LMP with hysterectomy in 2014.  She has occasional pain in the left flank, and right neck/arm from recent cervical surgery.  Denies nausea/vomiting, fever, night sweats.    MEDICAL HISTORY:  Past Medical History:  Diagnosis Date   Abdominal pain 04/26/2013   Abnormal uterine bleeding (AUB) 10/11/2012   Acute bronchitis    Allergy    Anal pain    chronic   Anemia    Anxiety    Asthma     exacerbation 02-28-2014 and 02-23-2014 secondary to Rhinovirus   Atypical chest pain 04/26/2013   Benign neoplasm of sigmoid colon    Benign neoplasm of transverse colon    Carbuncle of labium 07/12/2015   Chest pain 02/20/2014   Chronic diarrhea    Chronic headaches    Chronic low back pain    Cigarette nicotine dependence without complication 05/04/2014   Cyst of right ovary    Dandruff 03/26/2015   Diabetes mellitus without complication (HCC) 10/13/2013   Diabetic neuropathy, painful (HCC) 12/13/2019   Difficult intravenous access    PER PT NEEDS PICC LINE   Dyspnea 09/07/2012   Spiro 08/2012:  No obstruction by FEV1%, but probable restriction.     Falls 03/27/2014   Food allergy    Mushrooms, shellfish   GAD (generalized anxiety disorder) 05/04/2014   Gait disturbance 04/11/2014   Gait instability    GERD (gastroesophageal reflux disease)    History of adenomatous polyp of colon    History of cardiac arrest    during SVD 1992   History of ectopic pregnancy    2009-  S/P LEFT SALPINGECTOMY   History of panic attacks    Hyperlipidemia    IBS (irritable bowel syndrome)    Insomnia 03/06/2014   Joint pain    Lower extremity edema    Lumbar stenosis L4 -- L5 with bulging disk   w/ right leg weakness/ decreased mobility   Migraine variant with headache 05/09/2014   Mild obstructive sleep apnea    study 03-20-2014  no cpap  recommended   Nausea and vomiting 09/16/2022   Neuromuscular disorder (HCC)    neuropathy in feet - no current problems as of 10/06/23 per patient   Neuropathic pain of both legs 06/04/2016   Obesity (BMI 30-39.9) 03/05/2018   OSA (obstructive sleep apnea) 03/06/2014   does not use CPAP - mild OSA   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   Panniculitis 03/05/2018   Pelvic pain 07/25/2013   Persistent vomiting 04/27/2013   Pneumonia    x 1   PTSD (post-traumatic stress disorder) 05/04/2014   Rash and nonspecific skin eruption 07/12/2015    Rectal bleeding 07/25/2013   hx   RLQ abdominal pain    S/P Total vaginal hysterectomy on 01/06/13 01/06/2013   Sleep apnea    mild no cpap   Social anxiety disorder 05/04/2014   Sore throat 02/20/2014   Stomach ulcer    hx   Tachycardia 02/20/2014   Type 2 diabetes mellitus (HCC)    Weakness of right leg    FROM BACK PROBLEM PER PT - no current problem per patient on 10/06/23    SURGICAL HISTORY: Past Surgical History:  Procedure Laterality Date   ABDOMINAL HYSTERECTOMY     partial   ANTERIOR CERVICAL DECOMP/DISCECTOMY FUSION N/A 10/09/2023   Procedure: ANTERIOR CERVICAL DECOMPRESSION/DISCECTOMY FUSION 1 LEVEL;  Surgeon: Gillie Duncans, MD;  Location: [facility];  Service: Neurosurgery;  Laterality: N/A;  ACDF - C4-C5   BIOPSY  01/22/2023   Procedure: BIOPSY;  Surgeon: Legrand Victory LITTIE DOUGLAS, MD;  Location: [facility];  Service: Gastroenterology;;   COLONOSCOPY Left 04/29/2013   Procedure: COLONOSCOPY;  Surgeon: Elsie Cree, MD;  Location: [facility];  Service: Endoscopy;  Laterality: Left;   COLONOSCOPY     COLONOSCOPY WITH PROPOFOL  N/A 08/01/2016   Procedure: COLONOSCOPY WITH PROPOFOL ;  Surgeon: Legrand Victory LITTIE DOUGLAS, MD;  Location: [facility];  Service: Gastroenterology;  Laterality: N/A;   COLONOSCOPY WITH PROPOFOL  N/A 01/22/2023   Procedure: COLONOSCOPY WITH PROPOFOL ;  Surgeon: Legrand Victory LITTIE DOUGLAS, MD;  Location: [facility];  Service: Gastroenterology;  Laterality: N/A;   ECTOPIC PREGNANCY SURGERY     ESOPHAGOGASTRODUODENOSCOPY (EGD) WITH PROPOFOL  N/A 11/10/2018   Procedure: ESOPHAGOGASTRODUODENOSCOPY (EGD) WITH PROPOFOL ;  Surgeon: Legrand Victory LITTIE DOUGLAS, MD;  Location: [facility];  Service: Gastroenterology;  Laterality: N/A;   EVALUATION UNDER ANESTHESIA WITH FISTULECTOMY N/A 04/20/2014   Procedure: EXAM UNDER ANESTHESIA ;  Surgeon: Bernarda Ned, MD;  [facility];  Service: General;  Laterality: N/A;   FLEXIBLE SIGMOIDOSCOPY N/A 11/09/2013   Procedure:  FLEXIBLE SIGMOIDOSCOPY;  Surgeon: Elsie Cree, MD;  Location: [facility];  Service: Endoscopy;  Laterality: N/A;   fupa removal      LAPAROSCOPIC CHOLECYSTECTOMY  2005   LAPAROSCOPIC GASTRIC SLEEVE RESECTION N/A 12/24/2020   Procedure: LAPAROSCOPIC GASTRIC SLEEVE RESECTION;  Surgeon: Signe Mitzie LABOR, MD;  Location: [facility];  Service: General;  Laterality: N/A;   REFRACTIVE SURGERY     SPHINCTEROTOMY N/A 04/20/2014   Procedure:  LATERAL INTERNAL SPHINCTEROTOMY;  Surgeon: Bernarda Ned, MD;  [facility];  Service: General;  Laterality: N/A;   TRANSTHORACIC ECHOCARDIOGRAM  12/30/2012   mild LVH/  ef 55-60%   UNILATERAL SALPINGECTOMY  2009   laparotomy left salpingectomy-- ectopic preg.   UPPER GASTROINTESTINAL ENDOSCOPY     UPPER GI ENDOSCOPY N/A 12/24/2020   Procedure: UPPER GI ENDOSCOPY;  Surgeon: Signe Mitzie LABOR, MD;  Location: [facility];  Service: General;  Laterality: N/A;   VAGINAL  HYSTERECTOMY N/A 01/06/2013   Procedure: HYSTERECTOMY VAGINAL;  Surgeon: Gloris DELENA Hugger, MD;  Location: [facility];  Service: Gynecology;  Laterality: N/A;    SOCIAL HISTORY: Social History   Socioeconomic History   Marital status: Married    Spouse name: Not on file   Number of children: 2   Years of education: Not on file   Highest education level: Some college, no degree  Occupational History   Occupation: disability  Tobacco Use   Smoking status: Former    Current packs/day: 0.00    Average packs/day: 0.2 packs/day for 11.0 years (2.2 ttl pk-yrs)    Types: Cigarettes    Start date: 11/18/2002    Quit date: 11/17/2013    Years since quitting: 10.1   Smokeless tobacco: Never   Tobacco comments:    2 years quit  Vaping Use   Vaping status: Never Used  Substance and Sexual Activity   Alcohol use: Not Currently    Comment: 1 bottle wine per day x3 years, quit 09/2023   Drug use: No   Sexual activity: Yes    Birth control/protection: Surgical    Comment: Hysterectomy   Other Topics Concern   Not on file  Social History Narrative   Not on file   Social Drivers of Health   Financial Resource Strain: Low Risk  (07/22/2023)   Overall Financial Resource Strain (CARDIA)    Difficulty of Paying Living Expenses: Not hard at all  Food Insecurity: Food Insecurity Present (12/23/2023)   Hunger Vital Sign    Worried About Running Out of Food in the Last Year: Never true    Ran Out of Food in the Last Year: Sometimes true  Transportation Needs: No Transportation Needs (12/23/2023)   PRAPARE - Administrator, Civil Service (Medical): No    Lack of Transportation (Non-Medical): No  Physical Activity: Insufficiently Active (07/22/2023)   Exercise Vital Sign    Days of Exercise per Week: 2 days    Minutes of Exercise per Session: 40 min  Stress: Stress Concern Present (07/22/2023)   [employer] of Occupational Health - Occupational Stress Questionnaire    Feeling of Stress : Very much  Social Connections: Socially Integrated (07/22/2023)   Social Connection and Isolation Panel    Frequency of Communication with Friends and Family: More than three times a week    Frequency of Social Gatherings with Friends and Family: Twice a week    Attends Religious Services: More than 4 times per year    Active Member of [employer] or Organizations: Yes    Attends Engineer, Structural: More than 4 times per year    Marital Status: Married  Catering Manager Violence: Not At Risk (12/23/2023)   Humiliation, Afraid, Rape, and Kick questionnaire    Fear of Current or Ex-Partner: No    Emotionally Abused: No    Physically Abused: No    Sexually Abused: No    FAMILY HISTORY: Family History  Problem Relation Age of Onset   Cancer Mother        pancreatic, cervical   Hypertension Mother    Diabetes Mother    Allergies Mother    Heart disease Mother    Clotting disorder Mother    Stroke Mother    Kidney disease Mother    Thyroid  disease Mother    Cancer  Father    Hyperlipidemia Father    Hypertension Father    Colon cancer Father    Liver disease  Father    Schizophrenia Sister    Bipolar disorder Sister    Clotting disorder Sister    Bipolar disorder Sister    Bipolar disorder Brother    Kidney disease Brother    Pancreatic cancer Maternal Aunt    Prostate cancer Maternal Uncle    Heart disease Maternal Grandmother    Breast cancer Maternal Grandmother 18   Liver cancer Maternal Grandfather    Rectal cancer Maternal Grandfather    Liver cancer Paternal Grandfather    Colon polyps Neg Hx    Esophageal cancer Neg Hx    Stomach cancer Neg Hx     ALLERGIES:  is allergic to asa [aspirin], mushroom extract complex (obsolete), penicillins, shellfish allergy, triamcinolone , and zoloft  [sertraline ].  MEDICATIONS:  Current Outpatient Medications  Medication Sig Dispense Refill   Accu-Chek Softclix Lancets lancets Use as instructed 100 each 12   albuterol  (PROVENTIL ) (2.5 MG/3ML) 0.083% nebulizer solution Take 3 mLs (2.5 mg total) by nebulization every 6 (six) hours as needed for wheezing or shortness of breath. 150 mL 1   atorvastatin  (LIPITOR) 40 MG tablet Take 1 tablet (40 mg total) by mouth daily. 90 tablet 0   Blood Glucose Monitoring Suppl (ACCU-CHEK GUIDE) w/Device KIT Check blood sugars daily 1 kit 0   buPROPion (WELLBUTRIN XL) 150 MG 24 hr tablet Take 1 tablet (150 mg total) by mouth daily. 90 tablet 0   busPIRone  (BUSPAR ) 15 MG tablet Take 1 tablet (15 mg total) by mouth 2 (two) times daily. 60 tablet 3   dicyclomine  (BENTYL ) 10 MG capsule Take 1 capsule (10 mg total) by mouth 3 (three) times daily before meals. 90 capsule 1   DULoxetine  40 MG CPEP Take 1 capsule (40 mg total) by mouth daily. 90 capsule 1   EPINEPHrine  0.3 mg/0.3 mL IJ SOAJ injection Inject 0.3 mg into the muscle as needed for anaphylaxis. 1 each 1   gabapentin  (NEURONTIN ) 300 MG capsule Take 1 capsule (300 mg total) by mouth 2 (two) times daily. 180 capsule 1    glucose blood (ACCU-CHEK GUIDE TEST) test strip Use as instructed. Check blood glucose level by fingerstick 1-2 times per day. E11.65 100 each 12   hydrOXYzine  (VISTARIL ) 50 MG capsule Take 1 capsule (50 mg total) by mouth every 8 (eight) hours as needed. 90 capsule 1   Multiple Vitamins-Minerals (BARIATRIC MULTIVITAMINS/IRON PO) Take 1 tablet by mouth daily.     ondansetron  (ZOFRAN ) 4 MG tablet Take 1 tablet (4 mg total) by mouth every 6 (six) hours as needed for nausea. 20 tablet 0   polyethylene glycol powder (GLYCOLAX /MIRALAX ) 17 GM/SCOOP powder Dissolve 1 capful (17 g) in liquid and take by mouth daily. 238 g 0   potassium chloride  SA (KLOR-CON  M) 20 MEQ tablet Take 2 tablets (40 mEq total) by mouth daily. 60 tablet 1   Semaglutide ,0.25 or 0.5MG /DOS, 2 MG/3ML SOPN Inject 0.25 mg into the skin once a week. 6 mL 1   SUMAtriptan  (IMITREX ) 100 MG tablet Take 1 tablet at the start of the headache. 12 tablet 3   topiramate  (TOPAMAX ) 25 MG tablet TAKE 1 TABLET BY MOUTH AT BEDTIME TO  DECREASE  MIGRAINE  FREQUENCY 90 tablet 0   Vitamin D , Ergocalciferol , (DRISDOL ) 1.25 MG (50000 UNIT) CAPS capsule Take 1 capsule (50,000 Units total) by mouth every 7 (seven) days. 12 capsule 0   albuterol  (PROVENTIL  HFA) 108 (90 Base) MCG/ACT inhaler Inhale 2 puffs into the lungs every 6 (six) hours as needed for  wheezing or shortness of breath. 6.7 g 2   tiZANidine  (ZANAFLEX ) 4 MG tablet Take 1 tablet (4 mg total) by mouth every 6 (six) hours as needed for muscle spasms. 30 tablet 0   No current facility-administered medications for this visit.    REVIEW OF SYSTEMS:   Constitutional: Denies fevers, chills or abnormal night sweats (+)fatigue (+)intentional weight loss Eyes: Denies blurriness of vision, double vision or watery eyes Ears, nose, mouth, throat, and face: Denies mucositis or sore throat Respiratory: Denies cough, dyspnea or wheezes (+)asthma Cardiovascular: Denies palpitation, chest discomfort or lower  extremity swelling Gastrointestinal:  Denies nausea, heartburn or change in bowel habits (+)hemorrhoids Skin: Denies abnormal skin rashes Lymphatics: Denies new lymphadenopathy or easy bruising Neurological:Denies numbness, tingling or new weaknesses (+)R neck/arm pain 2/2 cervical stenosis surgery  Behavioral/Psych: Mood is stable, no new changes (+)anxiety All other systems were reviewed with the patient and are negative.  PHYSICAL EXAMINATION:   Vitals:   12/23/23 1200  BP: (!) 128/93  Pulse: 94  Resp: 16  Temp: 98.2 F (36.8 C)  SpO2: 100%   Filed Weights   12/23/23 1200  Weight: 215 lb 3.2 oz (97.6 kg)    GENERAL:alert, no distress and comfortable SKIN: skin color, texture, turgor are normal, no rashes or significant lesions EYES:  sclera clear NECK: Without mass LYMPH:  no palpable cervical or supraclavicular lymphadenopathy LUNGS: clear with normal breathing effort HEART: regular rate & rhythm, no lower extremity edema ABDOMEN:abdomen soft, non-tender and normal bowel sounds.  No hepatosplenomegaly or palpable mass Musculoskeletal:no cyanosis of digits and no clubbing  PSYCH: alert & oriented x 3 with fluent speech NEURO: no focal motor/sensory deficits   LABORATORY DATA:  I have reviewed the data as listed    Latest Ref Rng & Units 10/06/2023    9:00 AM 06/30/2023    8:31 PM 06/30/2023    8:26 PM  CBC  WBC 4.0 - 10.5 K/uL 7.8   6.5   Hemoglobin 12.0 - 15.0 g/dL 84.7  86.3    86.6  86.2   Hematocrit 36.0 - 46.0 % 45.7  40.0    39.0  41.4   Platelets 150 - 400 K/uL 377   333        Latest Ref Rng & Units 12/08/2023   10:46 AM 10/06/2023    9:00 AM 07/27/2023    2:06 PM  CMP  Glucose 70 - 99 mg/dL 87  882    BUN 6 - 24 mg/dL 8  <5    Creatinine 9.42 - 1.00 mg/dL 9.35  9.27    Sodium 865 - 144 mmol/L 142  139    Potassium 3.5 - 5.2 mmol/L 4.2  3.7    Chloride 96 - 106 mmol/L 106  103    CO2 20 - 29 mmol/L 23  23    Calcium  8.7 - 10.2 mg/dL 89.9  8.7     Total Protein 6.0 - 8.5 g/dL 7.6   6.9   Total Bilirubin 0.0 - 1.2 mg/dL 0.4   0.4   Alkaline Phos 41 - 116 IU/L 80   86   AST 0 - 40 IU/L 65   17   ALT 0 - 32 IU/L 81   18      RADIOGRAPHIC STUDIES: I have personally reviewed the radiological images as listed and agreed with the findings in the report. No results found.  ASSESSMENT & PLAN: 48 year old female with no personal history of cancer  Family history  of cancer -Patient reports a strong family history of cancers including breast, colon, and pancreatic.  She qualifies for genetic testing and is interested, I referred her -Colonoscopy 01/22/2023 was benign [employer]) -Mammogram 09/14/2023 was benign -We recommend to stay up-to-date on age-appropriate screening such as mammogram, colonoscopy, pelvic/Pap smear which she has done -We discussed additional screenings for certain cancers based on genetic testing results, i.e. additional breast MRI if she has a genetic predisposition to breast cancer, etc -If she has BRCA mutation she would be a candidate for pancreatic cancer screening given her mother/1st degree relative had pancreatic cancer -Encouraged her to continue healthy active lifestyle with exercise and remaining alcohol/tobacco free. Discussed fiber diet, fruits/vegetables, and importance of vitamin D . Not recommending aspirin for CRC prevention due to her allergy.  Elevated LFTs -LFTs were normal until 12/08/2023, AST 65 ALT 81 with normal alk phos and bili -Prior heavy alcohol use but quit 3 months ago. - Reviewed a CT from 02/27/2023 which showed normal liver, no signs of cirrhosis, and no other abnormality or evidence of malignancy throughout the abdomen or pelvis -Exam is benign  - She can follow-up with PCP or GI for this     PLAN: -Referral to genetics -Continue age appropriate cancer screenings, with additional screenings depending on genetic test results  -Encouraged healthy active lifestyle with exercise and  remaining alcohol/tobacco free -Discussed fiber diet, fruits/vegetables, and importance of vitamin D . Not recommending aspirin for CRC prevention due to her allergy. -F/up elevated LFTs with PCP/GI -Pt seen with Dr. Lanny -F/up open    Orders Placed This Encounter  Procedures   Ambulatory referral to Genetics    Referral Priority:   Routine    Referral Type:   Consultation    Referral Reason:   Specialty Services Required    Number of Visits Requested:   1    All questions were answered. The patient knows to call the clinic with any problems, questions or concerns.      Denisse Whitenack K Aubreanna Percle, NP 12/23/2023    Addendum I have seen the patient, examined her. I agree with the assessment and and plan and have edited the notes.   Patient was referred for family history of malignancies including colon cancer, pancreatic cancer, and breast cancer, she has no personal history of malignancy.  We discussed genetic referral to rule out genetic syndrome, she agrees.  We also discussed cancer screening, healthy lifestyle, exercise, and weight loss to reduce her risk of cancer.  She previously drink alcohol heavily, but she has stopped.  She understands alcohol is risk for liver cancer.  Will see her as needed in the future.  Onita Lanny MD 12/23/2023

## 2023-12-24 NOTE — Telephone Encounter (Signed)
 I called patient and made her aware of pcp recommendations

## 2023-12-24 NOTE — Telephone Encounter (Signed)
 Recommendation to continue present dosage of Semaglutide . Also, schedule appointment for hemoglobin A1c to see if increased dose of Semaglutide  is indicated.

## 2024-01-04 ENCOUNTER — Telehealth: Payer: Self-pay

## 2024-01-04 NOTE — Telephone Encounter (Unsigned)
 Copied from CRM #[id number]. Topic: General - Other >> Jan 04, 2024 10:25 AM Burnard DEL wrote: Reason for CRM: Patient called in stating that she was advised by provider  to schedule an lab appointment for her A1c to be check to get her ozempic  dosage increased. There aren't any any orders  placed in epic for this A1 C check. Patient also stated that her cancer doctor is wanting her to have orders placed by PCP to have an endoscopy done to have her  looked at. Please contact patient once lab order is placed to get her scheduled with lab.

## 2024-01-04 NOTE — Telephone Encounter (Signed)
-   Schedule appointment for diabetes follow-up.  [facility], please call patient's established Oncology to determine the need for endoscopy and the specific order requested. Thank you.

## 2024-01-05 ENCOUNTER — Other Ambulatory Visit: Payer: Self-pay

## 2024-01-05 ENCOUNTER — Other Ambulatory Visit: Payer: Self-pay | Admitting: Family

## 2024-01-05 DIAGNOSIS — R748 Abnormal levels of other serum enzymes: Secondary | ICD-10-CM

## 2024-01-05 NOTE — Telephone Encounter (Signed)
 I called patient and made her aware of pcp recommendations and I transfer her to the front desk to schedule appointment

## 2024-01-05 NOTE — Telephone Encounter (Signed)
 AST and ALT labs ordered. Patient to schedule lab only appointment for collection. Referral to Gastroenterology for evaluation/management. Expect call soon with appointment details. Please let me know if I can further assist. Thank you.

## 2024-01-08 ENCOUNTER — Other Ambulatory Visit

## 2024-01-08 ENCOUNTER — Other Ambulatory Visit: Payer: Self-pay | Admitting: Family

## 2024-01-08 DIAGNOSIS — R748 Abnormal levels of other serum enzymes: Secondary | ICD-10-CM

## 2024-01-09 LAB — ALT: ALT: 36 IU/L — ABNORMAL HIGH (ref 0–32)

## 2024-01-09 LAB — AST: AST: 31 IU/L (ref 0–40)

## 2024-01-11 ENCOUNTER — Ambulatory Visit (INDEPENDENT_AMBULATORY_CARE_PROVIDER_SITE_OTHER): Admitting: Family

## 2024-01-11 ENCOUNTER — Ambulatory Visit: Payer: Self-pay | Admitting: Family

## 2024-01-11 ENCOUNTER — Encounter: Payer: Self-pay | Admitting: Family

## 2024-01-11 VITALS — BP 123/87 | HR 92 | Temp 98.3°F | Resp 16 | Ht 65.0 in | Wt 217.8 lb

## 2024-01-11 DIAGNOSIS — E1165 Type 2 diabetes mellitus with hyperglycemia: Secondary | ICD-10-CM | POA: Diagnosis not present

## 2024-01-11 DIAGNOSIS — E119 Type 2 diabetes mellitus without complications: Secondary | ICD-10-CM

## 2024-01-11 DIAGNOSIS — R748 Abnormal levels of other serum enzymes: Secondary | ICD-10-CM

## 2024-01-11 DIAGNOSIS — Z0189 Encounter for other specified special examinations: Secondary | ICD-10-CM

## 2024-01-11 DIAGNOSIS — Z712 Person consulting for explanation of examination or test findings: Secondary | ICD-10-CM

## 2024-01-11 DIAGNOSIS — Z7985 Long-term (current) use of injectable non-insulin antidiabetic drugs: Secondary | ICD-10-CM

## 2024-01-11 DIAGNOSIS — R635 Abnormal weight gain: Secondary | ICD-10-CM | POA: Diagnosis not present

## 2024-01-11 MED ORDER — SEMAGLUTIDE(0.25 OR 0.5MG/DOS) 2 MG/3ML ~~LOC~~ SOPN: 0.2500 mg | PEN_INJECTOR | SUBCUTANEOUS | 0 refills | Status: DC

## 2024-01-11 MED ORDER — PHENTERMINE HCL 15 MG PO CAPS: 15.0000 mg | ORAL_CAPSULE | ORAL | 0 refills | Status: DC

## 2024-01-11 NOTE — Progress Notes (Signed)
 Medication change, and to talk about latest test results

## 2024-01-11 NOTE — Progress Notes (Signed)
 Patient ID: Marisa [name], female    DOB: 05/10/1975  MRN: [id number]  CC: Chronic Conditions Follow-Up  Subjective: Marisa [name] is a 48 y.o. female who presents for chronic conditions follow-up.   Her concerns today include:  - Doing well on Semaglutide  0.25 mg, no issues/concerns. States Semaglutide  0.5 mg helps better with weight loss. Denies red flag symptoms associated with diabetes. - Due for diabetic eye exam. - Due for diabetic foot exam. - Would like to try weight loss pill and referral to weight specialist. She is trying to watch what she eats. - Questions about recent liver lab result.   Patient Active Problem List   Diagnosis Date Noted   Cervical cord compression with myelopathy (HCC) 10/09/2023   Positive blood culture 12/13/2022   Bacteremia 09/16/2022   Nausea and vomiting 09/16/2022   Vaginal itching 09/15/2022   Lactic acidosis 09/15/2022   GI bleed 09/14/2022   Colitis 09/14/2022   Elevated liver enzymes 09/14/2022   Elevated MCV 09/14/2022   Hypokalemia 09/14/2022   Multinodular thyroid  03/01/2021   Solitary pulmonary nodule 02/21/2021   Frequent falls 02/21/2021   Weakness of both lower extremities 02/21/2021   Carpal tunnel syndrome of right wrist 02/21/2021   Acute constipation 02/21/2021   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   Morbid obesity (HCC) 12/24/2020   Allergic rhinitis 06/26/2020   Hyperlipidemia associated with type 2 diabetes mellitus (HCC) 04/10/2020   Chronic pain syndrome 04/09/2020   Type 2 diabetes mellitus with diabetic neuropathy, without long-term current use of insulin  (HCC) 04/09/2020   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 12/13/2019   Vitamin D  deficiency 04/05/2019   Class 2 severe obesity with serious comorbidity and body mass index (BMI) of 36.0 to 36.9 in adult 04/05/2019   Obesity (BMI 30-39.9) 03/05/2018   Chronic diarrhea    Benign neoplasm of transverse colon     Benign neoplasm of sigmoid colon    Neuropathic pain of both legs 06/04/2016   Rash and nonspecific skin eruption 07/12/2015   Dandruff 03/26/2015   Migraine variant with headache 05/09/2014   GAD (generalized anxiety disorder) 05/04/2014   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   Social anxiety disorder 05/04/2014   PTSD (post-traumatic stress disorder) 05/04/2014   Depression 03/27/2014   OSA (obstructive sleep apnea) 03/06/2014   Insomnia 03/06/2014   History of cardiac arrest    Chest pain 02/20/2014   Rectal bleeding 07/25/2013   S/P Total vaginal hysterectomy on 01/06/13 01/06/2013   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     Current Outpatient Medications on File Prior to Visit  Medication Sig Dispense Refill   Accu-Chek Softclix Lancets lancets Use as instructed 100 each 12   albuterol  (PROVENTIL ) (2.5 MG/3ML) 0.083% nebulizer solution Take 3 mLs (2.5 mg total) by nebulization every 6 (six) hours as needed for wheezing or shortness of breath. 150 mL 1   atorvastatin  (LIPITOR) 40 MG tablet Take 1 tablet (40 mg total) by mouth daily. 90 tablet 0   Blood Glucose Monitoring Suppl (ACCU-CHEK GUIDE) w/Device KIT Check blood sugars daily 1 kit 0   buPROPion  (WELLBUTRIN  XL) 150 MG 24 hr tablet Take 1 tablet (150 mg total) by mouth daily. 90 tablet 0   busPIRone  (BUSPAR ) 15 MG tablet Take 1 tablet (15 mg total) by mouth 2 (two) times daily. 60 tablet 3   dicyclomine  (BENTYL ) 10 MG capsule Take 1 capsule (10 mg total) by mouth 3 (three) times daily before meals. 90 capsule 1  DULoxetine  40 MG CPEP Take 1 capsule (40 mg total) by mouth daily. 90 capsule 1   EPINEPHrine  0.3 mg/0.3 mL IJ SOAJ injection Inject 0.3 mg into the muscle as needed for anaphylaxis. 1 each 1   gabapentin  (NEURONTIN ) 300 MG capsule Take 1 capsule (300 mg total) by mouth 2 (two) times daily. 180 capsule 1   glucose blood (ACCU-CHEK GUIDE TEST) test strip Use as instructed. Check blood glucose level by fingerstick 1-2 times  per day. E11.65 100 each 12   hydrOXYzine  (VISTARIL ) 50 MG capsule Take 1 capsule (50 mg total) by mouth every 8 (eight) hours as needed. 90 capsule 1   Multiple Vitamins-Minerals (BARIATRIC MULTIVITAMINS/IRON PO) Take 1 tablet by mouth daily.     ondansetron  (ZOFRAN ) 4 MG tablet Take 1 tablet (4 mg total) by mouth every 6 (six) hours as needed for nausea. 20 tablet 0   polyethylene glycol powder (GLYCOLAX /MIRALAX ) 17 GM/SCOOP powder Dissolve 1 capful (17 g) in liquid and take by mouth daily. 238 g 0   potassium chloride  SA (KLOR-CON  M) 20 MEQ tablet Take 2 tablets (40 mEq total) by mouth daily. 60 tablet 1   SUMAtriptan  (IMITREX ) 100 MG tablet Take 1 tablet at the start of the headache. 12 tablet 3   topiramate  (TOPAMAX ) 25 MG tablet TAKE 1 TABLET BY MOUTH AT BEDTIME TO  DECREASE  MIGRAINE  FREQUENCY 90 tablet 0   Vitamin D , Ergocalciferol , (DRISDOL ) 1.25 MG (50000 UNIT) CAPS capsule Take 1 capsule (50,000 Units total) by mouth every 7 (seven) days. 12 capsule 0   albuterol  (PROVENTIL  HFA) 108 (90 Base) MCG/ACT inhaler Inhale 2 puffs into the lungs every 6 (six) hours as needed for wheezing or shortness of breath. 6.7 g 2   tiZANidine  (ZANAFLEX ) 4 MG tablet Take 1 tablet (4 mg total) by mouth every 6 (six) hours as needed for muscle spasms. 30 tablet 0   No current facility-administered medications on file prior to visit.    Allergies  Allergen Reactions   Asa [Aspirin] Anaphylaxis and Hives    Hives, chest tightness    Mushroom Extract Complex (Obsolete) Anaphylaxis, Swelling and Other (See Comments)    Reaction:  Eye swelling   Penicillins Anaphylaxis    Anaphylaxis ~48 yo necessitating ED visit (took pink thick liquid) - cannot recall taking any cephalosporins   Shellfish Allergy Anaphylaxis   Triamcinolone  Other (See Comments)    Skin issues    Zoloft  [Sertraline ]     Jittery feeling    Social History   Socioeconomic History   Marital status: Married    Spouse name: Not on file    Number of children: 2   Years of education: Not on file   Highest education level: Some college, no degree  Occupational History   Occupation: disability  Tobacco Use   Smoking status: Former    Current packs/day: 0.00    Average packs/day: 0.2 packs/day for 11.0 years (2.2 ttl pk-yrs)    Types: Cigarettes    Start date: 11/18/2002    Quit date: 11/17/2013    Years since quitting: 10.1   Smokeless tobacco: Never   Tobacco comments:    2 years quit  Vaping Use   Vaping status: Never Used  Substance and Sexual Activity   Alcohol use: Not Currently    Comment: 1 bottle wine per day x3 years, quit 09/2023   Drug use: No   Sexual activity: Yes    Birth control/protection: Surgical    Comment: Hysterectomy  Other Topics Concern   Not on file  Social History Narrative   Not on file   Social Drivers of Health   Financial Resource Strain: Low Risk  (07/22/2023)   Overall Financial Resource Strain (CARDIA)    Difficulty of Paying Living Expenses: Not hard at all  Food Insecurity: Food Insecurity Present (12/23/2023)   Hunger Vital Sign    Worried About Running Out of Food in the Last Year: Never true    Ran Out of Food in the Last Year: Sometimes true  Transportation Needs: No Transportation Needs (12/23/2023)   PRAPARE - Administrator, Civil Service (Medical): No    Lack of Transportation (Non-Medical): No  Physical Activity: Insufficiently Active (07/22/2023)   Exercise Vital Sign    Days of Exercise per Week: 2 days    Minutes of Exercise per Session: 40 min  Stress: Stress Concern Present (07/22/2023)   [employer] of Occupational Health - Occupational Stress Questionnaire    Feeling of Stress : Very much  Social Connections: Socially Integrated (07/22/2023)   Social Connection and Isolation Panel    Frequency of Communication with Friends and Family: More than three times a week    Frequency of Social Gatherings with Friends and Family: Twice a week    Attends  Religious Services: More than 4 times per year    Active Member of [employer] or Organizations: Yes    Attends Engineer, Structural: More than 4 times per year    Marital Status: Married  Catering Manager Violence: Not At Risk (12/23/2023)   Humiliation, Afraid, Rape, and Kick questionnaire    Fear of Current or Ex-Partner: No    Emotionally Abused: No    Physically Abused: No    Sexually Abused: No    Family History  Problem Relation Age of Onset   Cancer Mother        pancreatic, cervical   Hypertension Mother    Diabetes Mother    Allergies Mother    Heart disease Mother    Clotting disorder Mother    Stroke Mother    Kidney disease Mother    Thyroid  disease Mother    Cancer Father    Hyperlipidemia Father    Hypertension Father    Colon cancer Father    Liver disease Father    Schizophrenia Sister    Bipolar disorder Sister    Clotting disorder Sister    Bipolar disorder Sister    Bipolar disorder Brother    Kidney disease Brother    Pancreatic cancer Maternal Aunt    Prostate cancer Maternal Uncle    Heart disease Maternal Grandmother    Breast cancer Maternal Grandmother 40   Liver cancer Maternal Grandfather    Rectal cancer Maternal Grandfather    Liver cancer Paternal Grandfather    Colon polyps Neg Hx    Esophageal cancer Neg Hx    Stomach cancer Neg Hx     Past Surgical History:  Procedure Laterality Date   ABDOMINAL HYSTERECTOMY     partial   ANTERIOR CERVICAL DECOMP/DISCECTOMY FUSION N/A 10/09/2023   Procedure: ANTERIOR CERVICAL DECOMPRESSION/DISCECTOMY FUSION 1 LEVEL;  Surgeon: Gillie Duncans, MD;  Location: [facility];  Service: Neurosurgery;  Laterality: N/A;  ACDF - C4-C5   BIOPSY  01/22/2023   Procedure: BIOPSY;  Surgeon: Legrand Victory LITTIE DOUGLAS, MD;  Location: [facility];  Service: Gastroenterology;;   COLONOSCOPY Left 04/29/2013   Procedure: COLONOSCOPY;  Surgeon: Elsie Cree, MD;  Location: [facility];  Service: Endoscopy;  Laterality: Left;    COLONOSCOPY     COLONOSCOPY WITH PROPOFOL  N/A 08/01/2016   Procedure: COLONOSCOPY WITH PROPOFOL ;  Surgeon: Legrand Victory LITTIE DOUGLAS, MD;  Location: [facility];  Service: Gastroenterology;  Laterality: N/A;   COLONOSCOPY WITH PROPOFOL  N/A 01/22/2023   Procedure: COLONOSCOPY WITH PROPOFOL ;  Surgeon: Legrand Victory LITTIE DOUGLAS, MD;  Location: [facility];  Service: Gastroenterology;  Laterality: N/A;   ECTOPIC PREGNANCY SURGERY     ESOPHAGOGASTRODUODENOSCOPY (EGD) WITH PROPOFOL  N/A 11/10/2018   Procedure: ESOPHAGOGASTRODUODENOSCOPY (EGD) WITH PROPOFOL ;  Surgeon: Legrand Victory LITTIE DOUGLAS, MD;  Location: [facility];  Service: Gastroenterology;  Laterality: N/A;   EVALUATION UNDER ANESTHESIA WITH FISTULECTOMY N/A 04/20/2014   Procedure: EXAM UNDER ANESTHESIA ;  Surgeon: Bernarda Ned, MD;  Location: [facility];  Service: General;  Laterality: N/A;   FLEXIBLE SIGMOIDOSCOPY N/A 11/09/2013   Procedure: FLEXIBLE SIGMOIDOSCOPY;  Surgeon: Elsie Cree, MD;  Location: [facility];  Service: Endoscopy;  Laterality: N/A;   fupa removal      LAPAROSCOPIC CHOLECYSTECTOMY  2005   LAPAROSCOPIC GASTRIC SLEEVE RESECTION N/A 12/24/2020   Procedure: LAPAROSCOPIC GASTRIC SLEEVE RESECTION;  Surgeon: Signe Mitzie LABOR, MD;  Location: [facility];  Service: General;  Laterality: N/A;   REFRACTIVE SURGERY     SPHINCTEROTOMY N/A 04/20/2014   Procedure:  LATERAL INTERNAL SPHINCTEROTOMY;  Surgeon: Bernarda Ned, MD;  Location: [facility];  Service: General;  Laterality: N/A;   TRANSTHORACIC ECHOCARDIOGRAM  12/30/2012   mild LVH/  ef 55-60%   UNILATERAL SALPINGECTOMY  2009   laparotomy left salpingectomy-- ectopic preg.   UPPER GASTROINTESTINAL ENDOSCOPY     UPPER GI ENDOSCOPY N/A 12/24/2020   Procedure: UPPER GI ENDOSCOPY;  Surgeon: Signe Mitzie LABOR, MD;  Location: [facility];  Service: General;  Laterality: N/A;   VAGINAL HYSTERECTOMY N/A 01/06/2013   Procedure: HYSTERECTOMY VAGINAL;  Surgeon: Gloris LABOR Hugger, MD;  Location: [facility];  Service: Gynecology;  Laterality: N/A;    ROS: Review of Systems Negative except as stated above  PHYSICAL EXAM: BP 123/87   Pulse 92   Temp 98.3 F (36.8 C) (Oral)   Resp 16   Ht 5' 5 (1.651 m)   Wt 217 lb 12.8 oz (98.8 kg)   LMP 11/27/2012   SpO2 94%   BMI 36.24 kg/m   Wt Readings from Last 3 Encounters:  01/11/24 217 lb 12.8 oz (98.8 kg)  12/23/23 215 lb 3.2 oz (97.6 kg)  12/08/23 214 lb 9.6 oz (97.3 kg)    Physical Exam HENT:     Head: Normocephalic and atraumatic.     Nose: Nose normal.     Mouth/Throat:     Mouth: Mucous membranes are moist.     Pharynx: Oropharynx is clear.  Eyes:     Extraocular Movements: Extraocular movements intact.     Conjunctiva/sclera: Conjunctivae normal.     Pupils: Pupils are equal, round, and reactive to light.  Cardiovascular:     Rate and Rhythm: Normal rate and regular rhythm.     Pulses: Normal pulses.     Heart sounds: Normal heart sounds.  Pulmonary:     Effort: Pulmonary effort is normal.     Breath sounds: Normal breath sounds.  Musculoskeletal:        General: Normal range of motion.     Cervical back: Normal range of motion and neck supple.  Neurological:     General: No focal deficit present.  Mental Status: She is alert and oriented to person, place, and time.  Psychiatric:        Mood and Affect: Mood normal.        Behavior: Behavior normal.     1. Type 2 diabetes mellitus with hyperglycemia, without long-term current use of insulin  (HCC) (Primary) - Hemoglobin A1c result pending.  - Continue Semaglutide  as prescribed.  - Routine screening.  - Discussed the importance of healthy eating habits, low-carbohydrate diet, low-sugar diet, regular aerobic exercise (at least 150 minutes a week as tolerated) and medication compliance to achieve or maintain control of diabetes. Counseled on medication adherence/adverse effects.  - Follow-up with primary provider as scheduled.  -  Hemoglobin A1c - Microalbumin / creatinine urine ratio - Semaglutide ,0.25 or 0.5MG /DOS, 2 MG/3ML SOPN; Inject 0.25 mg into the skin once a week.  Dispense: 6 mL; Refill: 0  2. Diabetic eye exam [facility]) - Referral to Ophthalmology for evaluation/management. - Ambulatory referral to Ophthalmology  3. Encounter for diabetic foot exam [facility]) - Referral to Podiatry for evaluation/management. - Ambulatory referral to Podiatry  4. Weight gain - Phentermine  as prescribed. Counseled on medication adherence and adverse effects.  - I did check the [place]  prescription drug database. - Counseled on low-sodium DASH diet and 150 minutes of moderate intensity exercise per week as tolerated to assist with weight management.  - Referral to Medical Weight Management for evaluation/management. - Follow-up with primary provider as scheduled. - Amb Ref to Medical Weight Management - phentermine  15 MG capsule; Take 1 capsule (15 mg total) by mouth every morning.  Dispense: 30 capsule; Refill: 0  5. Encounter to discuss test results - I discussed with patient in detail lab result note from 01/11/2024  8:09 AM EST. Patient verbalized understanding.   Patient was given the opportunity to ask questions.  Patient verbalized understanding of the plan and was able to repeat key elements of the plan. Patient was given clear instructions to go to Emergency Department or return to medical center if symptoms don't improve, worsen, or new problems develop.The patient verbalized understanding.   Orders Placed This Encounter  Procedures   Hemoglobin A1c   Microalbumin / creatinine urine ratio   Ambulatory referral to Ophthalmology   Ambulatory referral to Podiatry   Amb Ref to Medical Weight Management     Requested Prescriptions   Signed Prescriptions Disp Refills   phentermine  15 MG capsule 30 capsule 0    Sig: Take 1 capsule (15 mg total) by mouth every morning.   Semaglutide ,0.25 or 0.5MG /DOS, 2  MG/3ML SOPN 6 mL 0    Sig: Inject 0.25 mg into the skin once a week.    Return in about 3 months (around 04/12/2024) for Follow-Up or next available chronic conditions.  Greig JINNY Chute, NP

## 2024-01-12 ENCOUNTER — Ambulatory Visit: Payer: Self-pay | Admitting: Family

## 2024-01-12 LAB — MICROALBUMIN / CREATININE URINE RATIO
Creatinine, Urine: 184.3 mg/dL
Microalb/Creat Ratio: 4 mg/g{creat} (ref 0–29)
Microalbumin, Urine: 7.4 ug/mL

## 2024-01-12 LAB — HEMOGLOBIN A1C
Est. average glucose Bld gHb Est-mCnc: 128 mg/dL
Hgb A1c MFr Bld: 6.1 % — ABNORMAL HIGH (ref 4.8–5.6)

## 2024-01-13 ENCOUNTER — Inpatient Hospital Stay

## 2024-01-13 ENCOUNTER — Inpatient Hospital Stay: Admitting: Genetic Counselor

## 2024-01-13 ENCOUNTER — Encounter: Payer: Self-pay | Admitting: Genetic Counselor

## 2024-01-13 DIAGNOSIS — Z800 Family history of malignant neoplasm of digestive organs: Secondary | ICD-10-CM

## 2024-01-13 LAB — GENETIC SCREENING ORDER

## 2024-01-13 NOTE — Progress Notes (Signed)
 REFERRING PROVIDER: Burton, Lacie K, NP [address] [place],  [place] [address]  PRIMARY PROVIDER:  Jaycee Greig PARAS, NP  PRIMARY REASON FOR VISIT:  1. Family history of malignant neoplasm of gastrointestinal tract      HISTORY OF PRESENT ILLNESS:   Ms. Talamantez, a 48 y.o. female, was seen for a [facility] genetics consultation at the request of Burton, Lacie K, NP due to a family history of cancer.  Ms. Delio presents to clinic today to discuss the possibility of a hereditary predisposition to cancer, to discuss genetic testing, and to further clarify her future cancer risks, as well as potential cancer risks for family members.   Ms. Plasse is a 48 y.o. female with no personal history of cancer.    RELEVANT MEDICAL HISTORY:  Menarche was at age 63.  First live birth at age 67.  Ovaries intact: one.  Uterus intact: no. Hysterectomy and one ovary removed  Menopausal status: premenopausal.  HRT use: 0 years. Colonoscopy: yes; three polyps detected. Mammogram within the last year: yes. Density B Number of breast biopsies: 0.   Past Medical History:  Diagnosis Date   Abdominal pain 04/26/2013   Abnormal uterine bleeding (AUB) 10/11/2012   Acute bronchitis    Allergy    Anal pain    chronic   Anemia    Anxiety    Asthma    exacerbation 02-28-2014 and 02-23-2014 secondary to Rhinovirus   Atypical chest pain 04/26/2013   Benign neoplasm of sigmoid colon    Benign neoplasm of transverse colon    Carbuncle of labium 07/12/2015   Chest pain 02/20/2014   Chronic diarrhea    Chronic headaches    Chronic low back pain    Cigarette nicotine dependence without complication 05/04/2014   Cyst of right ovary    Dandruff 03/26/2015   Diabetes mellitus without complication (HCC) 10/13/2013   Diabetic neuropathy, painful (HCC) 12/13/2019   Difficult intravenous access    PER PT NEEDS PICC LINE   Dyspnea 09/07/2012   Spiro 08/2012:  No obstruction by FEV1%, but  probable restriction.     Falls 03/27/2014   Food allergy    Mushrooms, shellfish   GAD (generalized anxiety disorder) 05/04/2014   Gait disturbance 04/11/2014   Gait instability    GERD (gastroesophageal reflux disease)    History of adenomatous polyp of colon    History of cardiac arrest    during SVD 1992   History of ectopic pregnancy    2009-  S/P LEFT SALPINGECTOMY   History of panic attacks    Hyperlipidemia    IBS (irritable bowel syndrome)    Insomnia 03/06/2014   Joint pain    Lower extremity edema    Lumbar stenosis L4 -- L5 with bulging disk   w/ right leg weakness/ decreased mobility   Migraine variant with headache 05/09/2014   Mild obstructive sleep apnea    study 03-20-2014  no cpap recommended   Nausea and vomiting 09/16/2022   Neuromuscular disorder (HCC)    neuropathy in feet - no current problems as of 10/06/23 per patient   Neuropathic pain of both legs 06/04/2016   Obesity (BMI 30-39.9) 03/05/2018   OSA (obstructive sleep apnea) 03/06/2014   does not use CPAP - mild OSA   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   Panniculitis 03/05/2018   Pelvic pain 07/25/2013   Persistent vomiting 04/27/2013   Pneumonia    x 1   PTSD (post-traumatic stress disorder)  05/04/2014   Rash and nonspecific skin eruption 07/12/2015   Rectal bleeding 07/25/2013   hx   RLQ abdominal pain    S/P Total vaginal hysterectomy on 01/06/13 01/06/2013   Sleep apnea    mild no cpap   Social anxiety disorder 05/04/2014   Sore throat 02/20/2014   Stomach ulcer    hx   Tachycardia 02/20/2014   Type 2 diabetes mellitus (HCC)    Weakness of right leg    FROM BACK PROBLEM PER PT - no current problem per patient on 10/06/23    Past Surgical History:  Procedure Laterality Date   ABDOMINAL HYSTERECTOMY     partial   ANTERIOR CERVICAL DECOMP/DISCECTOMY FUSION N/A 10/09/2023   Procedure: ANTERIOR CERVICAL DECOMPRESSION/DISCECTOMY FUSION 1 LEVEL;  Surgeon: Gillie Duncans, MD;   Location: [facility];  Service: Neurosurgery;  Laterality: N/A;  ACDF - C4-C5   BIOPSY  01/22/2023   Procedure: BIOPSY;  Surgeon: Legrand Victory LITTIE DOUGLAS, MD;  Location: [facility];  Service: Gastroenterology;;   COLONOSCOPY Left 04/29/2013   Procedure: COLONOSCOPY;  Surgeon: Elsie Cree, MD;  Location: [facility];  Service: Endoscopy;  Laterality: Left;   COLONOSCOPY     COLONOSCOPY WITH PROPOFOL  N/A 08/01/2016   Procedure: COLONOSCOPY WITH PROPOFOL ;  Surgeon: Legrand Victory LITTIE DOUGLAS, MD;  Location: [facility];  Service: Gastroenterology;  Laterality: N/A;   COLONOSCOPY WITH PROPOFOL  N/A 01/22/2023   Procedure: COLONOSCOPY WITH PROPOFOL ;  Surgeon: Legrand Victory LITTIE DOUGLAS, MD;  Location: [facility];  Service: Gastroenterology;  Laterality: N/A;   ECTOPIC PREGNANCY SURGERY     ESOPHAGOGASTRODUODENOSCOPY (EGD) WITH PROPOFOL  N/A 11/10/2018   Procedure: ESOPHAGOGASTRODUODENOSCOPY (EGD) WITH PROPOFOL ;  Surgeon: Legrand Victory LITTIE DOUGLAS, MD;  Location: [facility];  Service: Gastroenterology;  Laterality: N/A;   EVALUATION UNDER ANESTHESIA WITH FISTULECTOMY N/A 04/20/2014   Procedure: EXAM UNDER ANESTHESIA ;  Surgeon: Bernarda Ned, MD;  Location: [facility];  Service: General;  Laterality: N/A;   FLEXIBLE SIGMOIDOSCOPY N/A 11/09/2013   Procedure: FLEXIBLE SIGMOIDOSCOPY;  Surgeon: Elsie Cree, MD;  Location: [facility];  Service: Endoscopy;  Laterality: N/A;   fupa removal      LAPAROSCOPIC CHOLECYSTECTOMY  2005   LAPAROSCOPIC GASTRIC SLEEVE RESECTION N/A 12/24/2020   Procedure: LAPAROSCOPIC GASTRIC SLEEVE RESECTION;  Surgeon: Signe Mitzie LABOR, MD;  Location: [facility];  Service: General;  Laterality: N/A;   REFRACTIVE SURGERY     SPHINCTEROTOMY N/A 04/20/2014   Procedure:  LATERAL INTERNAL SPHINCTEROTOMY;  Surgeon: Bernarda Ned, MD;  Location: [facility];  Service: General;  Laterality: N/A;   TRANSTHORACIC ECHOCARDIOGRAM  12/30/2012   mild LVH/  ef 55-60%   UNILATERAL  SALPINGECTOMY  2009   laparotomy left salpingectomy-- ectopic preg.   UPPER GASTROINTESTINAL ENDOSCOPY     UPPER GI ENDOSCOPY N/A 12/24/2020   Procedure: UPPER GI ENDOSCOPY;  Surgeon: Signe Mitzie LABOR, MD;  Location: [facility];  Service: General;  Laterality: N/A;   VAGINAL HYSTERECTOMY N/A 01/06/2013   Procedure: HYSTERECTOMY VAGINAL;  Surgeon: Gloris LABOR Hugger, MD;  Location: [facility];  Service: Gynecology;  Laterality: N/A;    Social History   Socioeconomic History   Marital status: Married    Spouse name: Not on file   Number of children: 2   Years of education: Not on file   Highest education level: Some college, no degree  Occupational History   Occupation: disability  Tobacco Use   Smoking status: Former    Current packs/day: 0.00    Average  packs/day: 0.2 packs/day for 11.0 years (2.2 ttl pk-yrs)    Types: Cigarettes    Start date: 11/18/2002    Quit date: 11/17/2013    Years since quitting: 10.1   Smokeless tobacco: Never   Tobacco comments:    2 years quit  Vaping Use   Vaping status: Never Used  Substance and Sexual Activity   Alcohol use: Not Currently    Comment: 1 bottle wine per day x3 years, quit 09/2023   Drug use: No   Sexual activity: Yes    Birth control/protection: Surgical    Comment: Hysterectomy  Other Topics Concern   Not on file  Social History Narrative   Not on file   Social Drivers of Health   Financial Resource Strain: Low Risk  (07/22/2023)   Overall Financial Resource Strain (CARDIA)    Difficulty of Paying Living Expenses: Not hard at all  Food Insecurity: Food Insecurity Present (12/23/2023)   Hunger Vital Sign    Worried About Running Out of Food in the Last Year: Never true    Ran Out of Food in the Last Year: Sometimes true  Transportation Needs: No Transportation Needs (12/23/2023)   PRAPARE - Administrator, Civil Service (Medical): No    Lack of Transportation (Non-Medical): No  Physical Activity: Insufficiently Active  (07/22/2023)   Exercise Vital Sign    Days of Exercise per Week: 2 days    Minutes of Exercise per Session: 40 min  Stress: Stress Concern Present (07/22/2023)   [employer] of Occupational Health - Occupational Stress Questionnaire    Feeling of Stress : Very much  Social Connections: Socially Integrated (07/22/2023)   Social Connection and Isolation Panel    Frequency of Communication with Friends and Family: More than three times a week    Frequency of Social Gatherings with Friends and Family: Twice a week    Attends Religious Services: More than 4 times per year    Active Member of [employer] or Organizations: Yes    Attends Engineer, Structural: More than 4 times per year    Marital Status: Married     FAMILY HISTORY:  We obtained a detailed, 4-generation family history.  Significant diagnoses are listed below: Family History  Problem Relation Age of Onset   Hypertension Mother    Diabetes Mother    Allergies Mother    Heart disease Mother    Clotting disorder Mother    Stroke Mother    Kidney disease Mother    Thyroid  disease Mother    Cervical cancer Mother    Pancreatic cancer Father 45   Hyperlipidemia Father    Hypertension Father    Liver disease Father    Schizophrenia Sister    Bipolar disorder Sister    Clotting disorder Sister    Brain cancer Sister    Cervical cancer Sister    Bipolar disorder Sister    Bipolar disorder Brother    Kidney disease Brother    Drug abuse Brother    Drug abuse Brother    Colon cancer Paternal Uncle 31   Liver cancer Maternal Grandmother    Heart disease Maternal Grandmother    Colon cancer Paternal Grandfather 61   Liver cancer Other    Colon polyps Neg Hx    Esophageal cancer Neg Hx    Stomach cancer Neg Hx     Ms. Venard is unaware of previous family history of genetic testing for hereditary cancer risks There is reported  Ashkenazi Jewish ancestry in her paternal relatives.      GENETIC COUNSELING  ASSESSMENT: Ms. Laplant is a 48 y.o. female with a family history of cancer which is somewhat suggestive of a hereditary predisposition to cancer given the history of a first degree relative diagnosed with pancreatic cancer. We, therefore, discussed and recommended the following at today's visit.   DISCUSSION: We discussed that 5 - 10% of cancer is hereditary, with most cases of hereditary pancreatic cancer associated with pathogenic BRCA1/2.  There are other genes that can be associated with hereditary pancreatic cancer syndromes, such as Lynch syndrome or CDKN2A.  We discussed that testing is beneficial for several reasons, including knowing about other cancer risks, identifying potential screening and risk-reduction options that may be appropriate, and to understanding if other family members could be at risk for cancer and allowing them to undergo genetic testing.  We reviewed the characteristics, features and inheritance patterns of hereditary cancer syndromes. We also discussed genetic testing, including the appropriate family members to test, the process of testing, insurance coverage and turn-around-time for results. We discussed the implications of a negative, positive, carrier and/or variant of uncertain significant result. We discussed that negative results would be uninformative given that Ms. Keplinger does not have a personal history of cancer. We recommended Ms. Dilmore pursue genetic testing for a panel that contains genes associated with pancreatic and colon cancer.  Ms. Penton was offered a common hereditary cancer panel (40+ genes) and an expanded pan-cancer panel (70+ genes). Ms. Buckingham was informed of the benefits and limitations of each panel, including that expanded pan-cancer panels contain several genes that do not have clear management guidelines at this point in time.  We also discussed that as the number of genes included on a panel increases, the chances of variants  of uncertain significance increases.  After considering the benefits and limitations of each gene panel, Ms. Lindon elected to have [facility]'s MultiCancer +RNA panel. The [facility] MultiCancers panel includes analysis of the following 70 genes:AIP, ALK, APC, ATM, AXIN2, BAP1, BARD1, BLM, BMPR1A, BRCA1, BRCA2, BRIP1, CDC73, CDH1, CDK4, CDKN1B, CDKN2A, CHEK2, CTNNA1, DICER1, EGFR, EPCAM, FH, FLCN, GREM1, HOXB13, KIT, LZTR1, MAX, MBD4, MEN1, MET, MITF, MLH1, MSH2, MSH6, MUTYH, NF1, NF2, NTHL1, PALB2, PDGFRA, PMS2, POLD1, POLE, POT1, PRKAR1A, PTCH1, PTEN, RAD51C, RAD51D, RB1, RET, SDHA, SDHAF2, SDHB, SDHC, SDHD, SMAD4, SMARCA4, SMARCB1, SMARCE1, STK11, SUFU, TMEM127, TP53, TSC1, TSC2, VHL. RNA analysis was included for applicable genes.    Based on Ms. Diaz-Terry's family history of cancer, she meets medical criteria for genetic testing. Though Ms. Shurtz is not personally affected, there are no affected family members that are willing/able to undergo hereditary cancer testing. Despite that she meets criteria, she may still have an out of pocket cost. We discussed that if her out of pocket cost for testing is over $250, the laboratory should contact them to discuss self-pay prices, patient pay assistance programs, if applicable, and other billing options.  We discussed that some people do not want to undergo genetic testing due to fear of genetic discrimination.  A federal law called the Genetic Information Non-Discrimination Act (GINA) of 2008 helps protect individuals against genetic discrimination based on their genetic test results.  It impacts both health insurance and employment.  With health insurance, it protects against increased premiums, being kicked off insurance or being forced to take a test in order to be insured.  For employment it protects against hiring, firing and promoting decisions based on genetic test results.  GINA does not apply to those in the [employer], those who work for companies  with less than 15 employees, and new life insurance or long-term disability insurance policies.  Health status due to a cancer diagnosis is not protected under GINA.  PLAN: After considering the risks, benefits, and limitations, Ms. Vanvorst provided informed consent to pursue genetic testing and the blood sample was sent to [facility] for analysis of the MultiCancer +RNA panel. Results should be available within approximately 2-3 weeks' time, at which point they will be disclosed by telephone to Ms. Mapel, as will any additional recommendations warranted by these results. Ms. Diaz-Terry will receive a summary of her genetic counseling visit and a copy of her results once available. This information will also be available in Epic.   Lastly, we encouraged Ms. Furuya to remain in contact with cancer genetics annually so that we can continuously update the family history and inform her of any changes in cancer genetics and testing that may be of benefit for this family.   Ms. Diaz-Terry's questions were answered to her satisfaction today. Our contact information was provided should additional questions or concerns arise. Thank you for the referral and allowing us  to share in the care of your patient.   Burnard Ogren, MS, [facility] Licensed, [email or web address] phone: [phone]   55 minutes were spent on the date of the encounter in service to the patient including preparation, face-to-face consultation, documentation and care coordination.  The patient was seen alone.  Drs. Gudena and/or Lanny were available to discuss this case as needed.  _______________________________________________________________________ For Office Staff:  Number of people involved in session: 1 Was an Intern/ student involved with case: no

## 2024-01-18 ENCOUNTER — Other Ambulatory Visit: Payer: Self-pay | Admitting: Family

## 2024-01-18 ENCOUNTER — Other Ambulatory Visit: Payer: Self-pay | Admitting: Critical Care Medicine

## 2024-01-18 DIAGNOSIS — G5601 Carpal tunnel syndrome, right upper limb: Secondary | ICD-10-CM

## 2024-01-18 DIAGNOSIS — E785 Hyperlipidemia, unspecified: Secondary | ICD-10-CM

## 2024-01-18 NOTE — Telephone Encounter (Signed)
-   Gabapentin  last prescribed 09/04/2022 by Belvie Silvan, MD.  - Sertraline  last discontinued on 12/18/2022 by Barnie Louder, MD.  - Trazodone  last discontinued on 09/14/2022 by Shaily Librizzi Card, CPhT. - Schedule appointment with primary provider for further evaluation/management. During the interim report to the Emergency Department/Urgent Care/call 911 for immediate medical evaluation.

## 2024-01-19 NOTE — Telephone Encounter (Signed)
 Pt scheduled

## 2024-01-19 NOTE — Telephone Encounter (Signed)
 Noted

## 2024-01-20 NOTE — Telephone Encounter (Signed)
 Noted

## 2024-01-25 ENCOUNTER — Ambulatory Visit: Payer: Self-pay | Admitting: Genetic Counselor

## 2024-01-25 ENCOUNTER — Telehealth: Payer: Self-pay | Admitting: Genetic Counselor

## 2024-01-25 DIAGNOSIS — Z1379 Encounter for other screening for genetic and chromosomal anomalies: Secondary | ICD-10-CM | POA: Insufficient documentation

## 2024-01-25 NOTE — Telephone Encounter (Signed)
 I spoke to Marisa [name] to review results of genetic testing, completed with [facility] MultiCancer +RNA panel. 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 We discussed that we do not use VUS to make management decisions or identify at risk family members.  Discussed that we do not know why there is cancer in the family. It is possible that the cancers in history occurred by chance or due to environmental factors. It is possible there are family members that have a variant that she did not inherit. It is also possible there is a hereditary cause for cancer that cannot be identified with current technology or due to a gene that was not tested. It will be important for her to keep in contact with genetics to keep up with whether additional testing may be needed.  Please see counseling note for further detail on this result.

## 2024-01-25 NOTE — Progress Notes (Signed)
 HPI:  Marisa [name] was previously seen in the [facility] due to a family history of cancer and concerns regarding a hereditary predisposition to cancer. Please refer to our prior cancer genetics clinic note for more information regarding our discussion, assessment and recommendations, at the time. Ms. Diaz-Terry's recent genetic test results were disclosed to her, as were recommendations warranted by these results. These results and recommendations are discussed in more detail below.  Results were disclosed via telephone on 01/25/24.   FAMILY HISTORY:  We obtained a detailed, 4-generation family history.  Significant diagnoses are listed below: Family History  Problem Relation Age of Onset   Hypertension Mother    Diabetes Mother    Allergies Mother    Heart disease Mother    Clotting disorder Mother    Stroke Mother    Kidney disease Mother    Thyroid  disease Mother    Cervical cancer Mother    Pancreatic cancer Father 24   Hyperlipidemia Father    Hypertension Father    Liver disease Father    Schizophrenia Sister    Bipolar disorder Sister    Clotting disorder Sister    Brain cancer Sister    Cervical cancer Sister    Bipolar disorder Sister    Bipolar disorder Brother    Kidney disease Brother    Drug abuse Brother    Drug abuse Brother    Colon cancer Paternal Uncle 29   Liver cancer Maternal Grandmother    Heart disease Maternal Grandmother    Colon cancer Paternal Grandfather 61   Liver cancer Other    Colon polyps Neg Hx    Esophageal cancer Neg Hx    Stomach cancer Neg Hx     Ms. Gallaga is unaware of previous family history of genetic testing for hereditary cancer risks There is reported Ashkenazi Jewish ancestry in her paternal relatives.     GENETIC TEST RESULTS: Genetic testing reported out on 01/23/24 through the [facility] +RNA panel found no pathogenic mutations. The [facility] panel includes analysis of the  following 70 genes:AIP, ALK, APC, ATM, AXIN2, BAP1, BARD1, BLM, BMPR1A, BRCA1, BRCA2, BRIP1, CDC73, CDH1, CDK4, CDKN1B, CDKN2A, CHEK2, CTNNA1, DICER1, EGFR, EPCAM, FH, FLCN, GREM1, HOXB13, KIT, LZTR1, MAX, MBD4, MEN1, MET, MITF, MLH1, MSH2, MSH6, MUTYH, NF1, NF2, NTHL1, PALB2, PDGFRA, PMS2, POLD1, POLE, POT1, PRKAR1A, PTCH1, PTEN, RAD51C, RAD51D, RB1, RET, SDHA, SDHAF2, SDHB, SDHC, SDHD, SMAD4, SMARCA4, SMARCB1, SMARCE1, STK11, SUFU, TMEM127, TP53, TSC1, TSC2, VHL. RNA analysis was included for applicable genes. The test report has been scanned into EPIC and is located under the Molecular Pathology section of the Results Review tab.  A portion of the result report is included below for reference.     We discussed with Ms. Gruner that because current genetic testing is not perfect, it is possible there may be a gene mutation in one of these genes that current testing cannot detect, but that chance is small.  We also discussed, that there could be another gene that has not yet been discovered, or that we have not yet tested, that is responsible for the cancer diagnoses in the family. It is also possible there is a hereditary cause for the cancer in the family that Ms. Bendall did not inherit and therefore was not identified in her testing.  Therefore, it is important to remain in touch with cancer genetics in the future so that we can continue to offer Ms. Plouff the most up to date  genetic testing.   Genetic testing did identify two Variants of uncertain significance (VUS) - one in the LZTR1 gene called c.1333G>A (p.Val445Met) and one in the MSH3 gene, called c.3350A>G (p.Gln1117Arg). At this time, it is unknown if these variants are associated with increased cancer risk or if they are normal findings, but most variants such as these get reclassified to being inconsequential. They should not be used to make medical management decisions or identify at risk relatives. With time, we suspect the lab will  determine the significance of these variants, if any. If we do learn more about them, we will try to contact Ms. Rascon to discuss it further. However, it is important to stay in touch with us  periodically and keep the address and phone number up to date.  ADDITIONAL GENETIC TESTING: We discussed with Ms. Livecchi that her genetic testing was fairly extensive.  If there are genes identified to increase cancer risk that can be analyzed in the future, we would be happy to discuss and coordinate this testing at that time.    CANCER SCREENING RECOMMENDATIONS: Ms. Diaz-Terry's test result is considered negative (normal).  This means that we have not identified a hereditary cause for her family history of cancer at this time. Most cancers happen by chance and this negative test suggests that her family history of cancer may fall into this category.    Possible reasons for Ms. Diaz-Terry's negative genetic test include:  1. There may be a gene mutation in one of these genes that current testing methods cannot detect but that chance is small.  2. There could be another gene that has not yet been discovered, or that we have not yet tested, that is responsible for the cancer diagnoses in the family.  3.  There may be no hereditary risk for cancer in the family. The cancers in Ms. Bouldin and/or her family may be sporadic/familial or due to other genetic and environmental factors. 4. It is also possible there is a hereditary cause for the cancer in the family that Ms. Kirsten did not inherit.  Therefore, it is recommended she continue to follow the cancer management and screening guidelines provided by her primary healthcare provider. An individual's cancer risk and medical management are not determined by genetic test results alone. Overall cancer risk assessment incorporates additional factors, including personal medical history, family history, and any available genetic information that may result in  a personalized plan for cancer prevention and surveillance  Given Ms. Diaz-Terry's family history, we must interpret these negative results with some caution.  Families with features suggestive of hereditary risk for cancer tend to have multiple family members with cancer, diagnoses in multiple generations and diagnoses before the age of 88. Ms. Diaz-Terry's family exhibits some of these features. Thus, this result may simply reflect our current inability to detect all mutations within these genes or there may be a different gene that has not yet been discovered or tested.   RECOMMENDATIONS FOR FAMILY MEMBERS:  Individuals in this family might be at some increased risk of developing cancer, over the general population risk, simply due to the family history of cancer.  We recommended women in this family have a yearly mammogram beginning at age 45, or 34 years younger than the earliest onset of cancer, an annual clinical breast exam, and perform monthly breast self-exams. Women in this family should also have a gynecological exam as recommended by their primary provider. All family members should be referred for colonoscopy starting at  age 56, or 10 years younger than the earliest onset of cancer.  It is also possible there is a hereditary cause for the cancer in Ms. Diaz-Terry's family that she did not inherit and therefore was not identified in her.  Based on Ms. Diaz-Terry's family history, testing would still be recommended for her siblings, especially those diagnosed with cancer.   FOLLOW-UP: Lastly, we discussed with Ms. Trawick that cancer genetics is a rapidly advancing field and it is possible that new genetic tests will be appropriate for her and/or her family members in the future. We encouraged her to remain in contact with cancer genetics on an annual basis so we can update her personal and family histories and let her know of advances in cancer genetics that may benefit this family.   Our  contact number was provided. Ms. Diaz-Terry's questions were answered to her satisfaction, and she knows she is welcome to call us  at anytime with additional questions or concerns.   Burnard Ogren, MS, [facility], [email or web address] [phone]

## 2024-01-27 ENCOUNTER — Other Ambulatory Visit: Payer: Self-pay | Admitting: Family

## 2024-01-27 DIAGNOSIS — Z0289 Encounter for other administrative examinations: Secondary | ICD-10-CM

## 2024-01-27 DIAGNOSIS — E1165 Type 2 diabetes mellitus with hyperglycemia: Secondary | ICD-10-CM

## 2024-01-29 ENCOUNTER — Encounter: Admitting: Obstetrics & Gynecology

## 2024-02-16 ENCOUNTER — Ambulatory Visit (HOSPITAL_COMMUNITY): Admitting: Family

## 2024-02-22 ENCOUNTER — Encounter (INDEPENDENT_AMBULATORY_CARE_PROVIDER_SITE_OTHER): Payer: Self-pay | Admitting: Family Medicine

## 2024-02-22 ENCOUNTER — Inpatient Hospital Stay: Admitting: Genetic Counselor

## 2024-02-22 ENCOUNTER — Ambulatory Visit (INDEPENDENT_AMBULATORY_CARE_PROVIDER_SITE_OTHER): Admitting: Family Medicine

## 2024-02-22 ENCOUNTER — Inpatient Hospital Stay

## 2024-02-22 VITALS — BP 108/75 | HR 94 | Temp 97.9°F | Ht 65.0 in | Wt 209.0 lb

## 2024-02-22 DIAGNOSIS — Z1331 Encounter for screening for depression: Secondary | ICD-10-CM

## 2024-02-22 DIAGNOSIS — Z6834 Body mass index (BMI) 34.0-34.9, adult: Secondary | ICD-10-CM

## 2024-02-22 DIAGNOSIS — K760 Fatty (change of) liver, not elsewhere classified: Secondary | ICD-10-CM | POA: Diagnosis not present

## 2024-02-22 DIAGNOSIS — E1169 Type 2 diabetes mellitus with other specified complication: Secondary | ICD-10-CM | POA: Diagnosis not present

## 2024-02-22 DIAGNOSIS — Z7985 Long-term (current) use of injectable non-insulin antidiabetic drugs: Secondary | ICD-10-CM | POA: Diagnosis not present

## 2024-02-22 DIAGNOSIS — E1165 Type 2 diabetes mellitus with hyperglycemia: Secondary | ICD-10-CM | POA: Diagnosis not present

## 2024-02-22 DIAGNOSIS — R5383 Other fatigue: Secondary | ICD-10-CM | POA: Diagnosis not present

## 2024-02-22 DIAGNOSIS — R0602 Shortness of breath: Secondary | ICD-10-CM | POA: Diagnosis not present

## 2024-02-22 DIAGNOSIS — E559 Vitamin D deficiency, unspecified: Secondary | ICD-10-CM

## 2024-02-22 DIAGNOSIS — Z9884 Bariatric surgery status: Secondary | ICD-10-CM | POA: Diagnosis not present

## 2024-02-22 DIAGNOSIS — E785 Hyperlipidemia, unspecified: Secondary | ICD-10-CM

## 2024-02-22 DIAGNOSIS — F411 Generalized anxiety disorder: Secondary | ICD-10-CM

## 2024-02-22 NOTE — Assessment & Plan Note (Signed)
 She is on cymbalta  currently but previously had vistaril , and buspar  prescribed.  Only taking cymbalta  now.

## 2024-02-22 NOTE — Assessment & Plan Note (Signed)
 Last LDL elevated at 138.  She is on lipitor 40mg  daily.  She is not experiencing joint or muscle aches.  Will need a repeat FLP in early February.

## 2024-02-22 NOTE — Progress Notes (Signed)
 "  Chief Complaint:  Obesity   Subjective:  Marisa [name] (MR# [id number]) is a 49 y.o. female who presents for evaluation and treatment of obesity and related comorbidities.   Marisa [name] is currently in the action stage of change and ready to dedicate time achieving and maintaining a healthier weight. Marisa [name] is interested in becoming our patient and working on intensive lifestyle modifications including (but not limited to) diet and exercise for weight loss.  Marisa [name] has been struggling with her weight. She has been unsuccessful in either losing weight, maintaining weight loss, or reaching her healthy weight goal.  She underwent sleeve gastrectomy in 2022 and mentions she does not have much food intake restriction left.  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  Family is supportive of her, they all eat meals together and she is expecting all to change how they eat with her.  She is the cook and the grocery shopper.  Desired weight is 165 and the last time she was that weight was when she was 49 years old- not sure what happened after that.  Has previously tried Weight Watchers, Marisa [name] and calorie counting (was taking in 800 calories a day). She skips breakfast daily due to last of hunger- will drink tea in the am.    Food Recall: Lunch- tomatoes, pickles and olives with vinegar (1.5 cups)- feels full.  Will have a snack nature's made honey bar and activia yogurt x 2.  Will drink coconut water  or vita water .    Will eat around 6pm for dinner. Rice (1 cup), meat (1 cup of steak or chicken), vegetable (1/2  cup).  Feels full.  Starts to have cravings around 9-10 pm.  Will have ice cream 1 cup.  Will then have a nutrabar.    Indirect Calorimeter completed today shows a RMR: 1440  .  Her calculated basal metabolic rate is 8394 thus her basal metabolic rate is worse than expected.  Other Fatigue Marisa [name] admits to daytime somnolence and  admits to waking up still tired. Patient has a history of symptoms of morning headache. Amit generally gets 6 hours of sleep per night, and states that she has generally restless sleep. Snoring is present. Apneic episodes are not present. Epworth Sleepiness Score is 9.   Shortness of Breath Marisa [name] notes increasing shortness of breath with exercising and seems to be worsening over time with weight gain. She notes getting out of breath sooner with activity than she used to. This has not gotten worse recently. Marisa [name] denies shortness of breath at rest or orthopnea.  Depression Screen Marisa [name] Food and Mood (modified PHQ-9) score was 13.     02/22/2024    8:26 AM  Depression screen PHQ 2/9  Decreased Interest 1  Down, Depressed, Hopeless 0  PHQ - 2 Score 1  Altered sleeping 1  Tired, decreased energy 1  Change in appetite 0  Feeling bad or failure about yourself  0  Trouble concentrating 1  Moving slowly or fidgety/restless 0  Suicidal thoughts 0  PHQ-9 Score 4  Difficult doing work/chores Somewhat difficult     Objective:  Vitals Temp: 97.9 F (36.6 C) BP: 108/75 Pulse Rate: 94 SpO2: 97 %   Anthropometric Measurements Height: 5' 5 (1.651 m) Weight: 209 lb (94.8 kg) BMI (Calculated): 34.78 Starting Weight: 209 lb Peak Weight: 250 lb Waist Measurement : 52 inches   Body Composition  Body Fat %: 45.6 % Fat  Mass (lbs): 95.6 lbs Muscle Mass (lbs): 108.2 lbs Total Body Water  (lbs): 75.8 lbs Visceral Fat Rating : 12   Other Clinical Data RMR: 1440 Fasting: yes Labs: yes Today's Visit #: 1 Starting Date: 02/22/24 Comments: restart from 06/07/19    EKG: rate 96.  General: Cooperative, alert, well developed, in no acute distress. HEENT: Conjunctivae and lids unremarkable. Cardiovascular: Regular rhythm.  Lungs: Normal work of breathing. Neurologic: No focal deficits.   Lab Results  Component Value Date   CREATININE 0.64 12/08/2023   BUN 8  12/08/2023   NA 142 12/08/2023   K 4.2 12/08/2023   CL 106 12/08/2023   CO2 23 12/08/2023   Lab Results  Component Value Date   ALT 36 (H) 01/08/2024   AST 31 01/08/2024   ALKPHOS 80 12/08/2023   BILITOT 0.4 12/08/2023   Lab Results  Component Value Date   HGBA1C 6.1 (H) 01/11/2024   HGBA1C 5.7 (H) 10/06/2023   HGBA1C 6.0 (A) 07/22/2023   HGBA1C 5.5 12/18/2022   HGBA1C 6.1 (H) 09/04/2022   Lab Results  Component Value Date   INSULIN  12.7 03/03/2019   Lab Results  Component Value Date   TSH 1.080 07/22/2023   Lab Results  Component Value Date   CHOL 206 (H) 12/08/2023   HDL 40 12/08/2023   LDLCALC 138 (H) 12/08/2023   TRIG 155 (H) 12/08/2023   CHOLHDL 3.7 08/26/2023   Lab Results  Component Value Date   WBC 7.8 10/06/2023   HGB 15.2 (H) 10/06/2023   HCT 45.7 10/06/2023   MCV 99.8 10/06/2023   PLT 377 10/06/2023   No results found for: IRON, TIBC, FERRITIN  Assessment and Plan:  Assessment & Plan Other fatigue  SOBOE (shortness of breath on exertion)  Depression screening  Type 2 diabetes mellitus with hyperglycemia, without long-term current use of insulin  (HCC) Last A1c of 6.1.  This is increased from prior A1c.  Her last eye exam was in August of 2025.  She mentions she is on Ozempic  and feels an increase in her Ozempic  dose may be necessary.  Hyperlipidemia associated with type 2 diabetes mellitus (HCC) Last LDL elevated at 138.  She is on lipitor 40mg  daily.  She is not experiencing joint or muscle aches.  Will need a repeat FLP in early February. Vitamin D  deficiency Taking prescription strength Vitamin D  since last lab check in October.  No nausea, vomiting or muscle weakness.  Needs a repeat lab evaluation in February.  GAD (generalized anxiety disorder) She is on cymbalta  currently but previously had vistaril , and buspar  prescribed.  Only taking cymbalta  now.  NAFLD (nonalcoholic fatty liver disease) LFTs in October elevated and then  significantly lower on repeat test at end of November.  Follow up repeat labs in 2 months. S/P laparoscopic sleeve gastrectomy Sleeve in 2022.  Patient reports less stomach tissue removed due to hepatomegaly.  Not much restriction felt intake wise.  CBC ordered today as well as B12 and Folate.  BMI 34.0-34.9,adult  Morbid obesity (HCC)    Other Fatigue  Marisa [name] does feel that her weight is causing her energy to be lower than it should be. Fatigue may be related to obesity, depression or many other causes. Labs will be ordered, and in the meanwhile, Marisa [name] will focus on self care including making healthy food choices, increasing physical activity and focusing on stress reduction.  Shortness of Breath  Marisa [name] does feel that she gets out of breath more easily that she used  to when she exercises. Marisa [name]'s shortness of breath appears to be obesity related and exercise induced. She has agreed to work on weight loss and gradually increase exercise to treat her exercise induced shortness of breath. Will continue to monitor closely.   Problem List Items Addressed This Visit       Endocrine   Hyperlipidemia associated with type 2 diabetes mellitus (HCC)     Other   GAD (generalized anxiety disorder)   Vitamin D  deficiency   Morbid obesity (HCC)   S/P laparoscopic sleeve gastrectomy   Relevant Orders   Vitamin B12   CBC with Differential/Platelet   Folate   Other Visit Diagnoses       Other fatigue    -  Primary   Relevant Orders   T4, free   T3   TSH     SOBOE (shortness of breath on exertion)         Depression screening         Type 2 diabetes mellitus with hyperglycemia, without long-term current use of insulin  (HCC)       Relevant Orders   Insulin , random     NAFLD (nonalcoholic fatty liver disease)         BMI 34.0-34.9,adult           Marisa [name] is currently in the action stage of change and her goal is to continue with weight loss efforts. I recommend Marisa [name]  begin the structured treatment plan as follows:  She has agreed to Category 2 Plan  Exercise goals: All adults should avoid inactivity. Some activity is better than none, and adults who participate in any amount of physical activity, gain some health benefits.  Behavioral modification strategies:increasing lean protein intake, decreasing simple carbohydrates, increasing vegetables, and meal planning and cooking strategies  She was informed of the importance of frequent follow-up visits to maximize her success with intensive lifestyle modifications for her multiple health conditions. She was informed we would discuss her lab results at her next visit unless there is a critical issue that needs to be addressed sooner. Marisa [name] agreed to keep her next visit at the agreed upon time to discuss these results.  Labs ordered with plans to discuss at the next visit.   Attestation Statements:  Reviewed by clinician on day of visit: allergies, medications, problem list, medical history, surgical history, family history, social history, and previous encounter notes. This is the patient's first visit at [facility]. The patient's NEW PATIENT PACKET was reviewed at length. Included in the packet: current and past health history, medications, allergies, ROS, gynecologic history (women only), surgical history, family history, social history, weight history, weight loss surgery history (for those that have had weight loss surgery), nutritional evaluation, mood and food questionnaire, PHQ9, Epworth questionnaire, sleep habits questionnaire, patient life and health improvement goals questionnaire. These will all be scanned into the patient's chart under media.   During the visit, I independently reviewed the patient's EKG, bioimpedance scale results, and indirect calorimeter results. I used this information to tailor a meal plan for the patient that will help her to lose weight and will improve her  obesity-related conditions going forward. I performed a medically necessary appropriate examination and/or evaluation. I discussed the assessment and treatment plan with the patient. The patient was provided an opportunity to ask questions and all were answered. The patient agreed with the plan and demonstrated an understanding of the instructions. Labs were ordered at this visit and will be reviewed at  the next visit unless more critical results need to be addressed immediately. Clinical information was updated and documented in the EMR.   Adelita Cho, MD  "

## 2024-02-22 NOTE — Assessment & Plan Note (Signed)
 Taking prescription strength Vitamin D  since last lab check in October.  No nausea, vomiting or muscle weakness.  Needs a repeat lab evaluation in February.

## 2024-02-22 NOTE — Assessment & Plan Note (Signed)
 Sleeve in 2022.  Patient reports less stomach tissue removed due to hepatomegaly.  Not much restriction felt intake wise.  CBC ordered today as well as B12 and Folate.

## 2024-02-23 ENCOUNTER — Ambulatory Visit: Admitting: Obstetrics and Gynecology

## 2024-02-23 ENCOUNTER — Encounter: Payer: Self-pay | Admitting: Obstetrics and Gynecology

## 2024-02-23 ENCOUNTER — Other Ambulatory Visit (HOSPITAL_COMMUNITY)
Admission: RE | Admit: 2024-02-23 | Discharge: 2024-02-23 | Disposition: A | Discharge status: Home / Self Care | Source: Ambulatory Visit | Attending: Obstetrics and Gynecology | Admitting: Obstetrics and Gynecology

## 2024-02-23 VITALS — BP 109/78 | HR 84 | Wt 212.7 lb

## 2024-02-23 DIAGNOSIS — Z01419 Encounter for gynecological examination (general) (routine) without abnormal findings: Secondary | ICD-10-CM

## 2024-02-23 DIAGNOSIS — Z1151 Encounter for screening for human papillomavirus (HPV): Secondary | ICD-10-CM | POA: Diagnosis not present

## 2024-02-23 DIAGNOSIS — Z90710 Acquired absence of both cervix and uterus: Secondary | ICD-10-CM

## 2024-02-23 DIAGNOSIS — R102 Pelvic and perineal pain unspecified side: Secondary | ICD-10-CM | POA: Insufficient documentation

## 2024-02-23 DIAGNOSIS — Z1239 Encounter for other screening for malignant neoplasm of breast: Secondary | ICD-10-CM

## 2024-02-23 MED ORDER — PSYLLIUM 33 % PO POWD: ORAL | Status: AC

## 2024-02-23 NOTE — Progress Notes (Signed)
 Obstetrics and Gynecology New Patient Evaluation  Appointment Date: 02/23/2024  OBGYN Clinic: [facility] for [facility]  Primary Care Provider: Jaycee Greig PARAS  Referring Provider: Jaycee Greig PARAS, NP  Chief Complaint:  Chief Complaint  Patient presents with   GYN Visit     History of Present Illness: Marisa [name] is a 49 y.o.  [phone] (Patient's last menstrual period was 11/27/2012.), seen for the above chief complaint. Her past medical history is significant for 2014 TVH for benign indications  For past few weeks, patient endorses left sided vaginal sharp discomfort that lasts for about 10 minutes and goes away. No inciting event or prior history. Patient's husband lives in [place] and she has not been sexually active. No lower urinary tract s/s and no VB or spotting, discharge, vaginal dryness. H/o hot flashes and night sweats but nothing recently.  +constipation and goes BM about once per day but she states it takes her awhile  Review of Systems: Pertinent items are noted in HPI.   Patient Active Problem List   Diagnosis Date Noted   Genetic testing 01/25/2024   Cervical cord compression with myelopathy (HCC) 10/09/2023   Positive blood culture 12/13/2022   Bacteremia 09/16/2022   Nausea and vomiting 09/16/2022   Vaginal itching 09/15/2022   Lactic acidosis 09/15/2022   GI bleed 09/14/2022   Colitis 09/14/2022   Elevated liver enzymes 09/14/2022   Elevated MCV 09/14/2022   Hypokalemia 09/14/2022   Multinodular thyroid  03/01/2021   Solitary pulmonary nodule 02/21/2021   Frequent falls 02/21/2021   Weakness of both lower extremities 02/21/2021   Carpal tunnel syndrome of right wrist 02/21/2021   Acute constipation 02/21/2021   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   Morbid obesity (HCC) 12/24/2020   Allergic rhinitis 06/26/2020   Hyperlipidemia associated with type 2 diabetes mellitus (HCC) 04/10/2020   Chronic pain syndrome  04/09/2020   Type 2 diabetes mellitus with diabetic neuropathy, without long-term current use of insulin  (HCC) 04/09/2020   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 12/13/2019   Vitamin D  deficiency 04/05/2019   Class 2 severe obesity with serious comorbidity and body mass index (BMI) of 36.0 to 36.9 in adult 04/05/2019   Obesity (BMI 30-39.9) 03/05/2018   Chronic diarrhea    Benign neoplasm of transverse colon    Benign neoplasm of sigmoid colon    Neuropathic pain of both legs 06/04/2016   Rash and nonspecific skin eruption 07/12/2015   Dandruff 03/26/2015   Migraine variant with headache 05/09/2014   GAD (generalized anxiety disorder) 05/04/2014   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   Social anxiety disorder 05/04/2014   PTSD (post-traumatic stress disorder) 05/04/2014   Depression 03/27/2014   OSA (obstructive sleep apnea) 03/06/2014   Insomnia 03/06/2014   History of cardiac arrest    Chest pain 02/20/2014   Rectal bleeding 07/25/2013   S/P Total vaginal hysterectomy on 01/06/13 01/06/2013   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   Past Medical History:  Past Medical History:  Diagnosis Date   Abdominal pain 04/26/2013   Abnormal uterine bleeding (AUB) 10/11/2012   Acute bronchitis    Allergy    Anal pain    chronic   Anemia    Anxiety    Asthma    exacerbation 02-28-2014 and 02-23-2014 secondary to Rhinovirus   Atypical chest pain 04/26/2013   Back pain    Benign neoplasm of sigmoid colon    Benign neoplasm of transverse colon    Carbuncle of labium  07/12/2015   Chest pain 02/20/2014   Chronic diarrhea    Chronic headaches    Chronic low back pain    Cigarette nicotine dependence without complication 05/04/2014   Cyst of right ovary    Dandruff 03/26/2015   Diabetes mellitus without complication (HCC) 10/13/2013   Diabetic neuropathy, painful (HCC) 12/13/2019   Difficult intravenous access    PER PT NEEDS PICC LINE   Dyspnea 09/07/2012    Spiro 08/2012:  No obstruction by FEV1%, but probable restriction.     Falls 03/27/2014   Fatty liver    Food allergy    Mushrooms, shellfish   GAD (generalized anxiety disorder) 05/04/2014   Gait disturbance 04/11/2014   Gait instability    GERD (gastroesophageal reflux disease)    History of adenomatous polyp of colon    History of cardiac arrest    during SVD 1992   History of ectopic pregnancy    2009-  S/P LEFT SALPINGECTOMY   History of panic attacks    Hyperlipidemia    IBS (irritable bowel syndrome)    Insomnia 03/06/2014   Joint pain    Lower extremity edema    Lumbar stenosis L4 -- L5 with bulging disk   w/ right leg weakness/ decreased mobility   Migraine variant with headache 05/09/2014   Mild obstructive sleep apnea    study 03-20-2014  no cpap recommended   Nausea and vomiting 09/16/2022   Neuromuscular disorder (HCC)    neuropathy in feet - no current problems as of 10/06/23 per patient   Neuropathic pain of both legs 06/04/2016   Obesity (BMI 30-39.9) 03/05/2018   OSA (obstructive sleep apnea) 03/06/2014   does not use CPAP - mild OSA   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   Panniculitis 03/05/2018   Pelvic pain 07/25/2013   Persistent vomiting 04/27/2013   Pneumonia    x 1   PTSD (post-traumatic stress disorder) 05/04/2014   Rash and nonspecific skin eruption 07/12/2015   Rectal bleeding 07/25/2013   hx   RLQ abdominal pain    S/P Total vaginal hysterectomy on 01/06/13 01/06/2013   Sleep apnea    mild no cpap   SOB (shortness of breath)    Social anxiety disorder 05/04/2014   Sore throat 02/20/2014   Stomach ulcer    hx   Tachycardia 02/20/2014   Type 2 diabetes mellitus (HCC)    Vitamin D  deficiency    Weakness of right leg    FROM BACK PROBLEM PER PT - no current problem per patient on 10/06/23   Past Surgical History:  Past Surgical History:  Procedure Laterality Date   ANTERIOR CERVICAL DECOMP/DISCECTOMY FUSION N/A 10/09/2023    Procedure: ANTERIOR CERVICAL DECOMPRESSION/DISCECTOMY FUSION 1 LEVEL;  Surgeon: Gillie Duncans, MD;  Location: [facility];  Service: Neurosurgery;  Laterality: N/A;  ACDF - C4-C5   BIOPSY  01/22/2023   Procedure: BIOPSY;  Surgeon: Legrand Victory LITTIE DOUGLAS, MD;  Location: [facility];  Service: Gastroenterology;;   COLONOSCOPY Left 04/29/2013   Procedure: COLONOSCOPY;  Surgeon: Elsie Cree, MD;  Location: [facility];  Service: Endoscopy;  Laterality: Left;   COLONOSCOPY WITH PROPOFOL  N/A 08/01/2016   Procedure: COLONOSCOPY WITH PROPOFOL ;  Surgeon: Legrand Victory LITTIE DOUGLAS, MD;  Location: [facility];  Service: Gastroenterology;  Laterality: N/A;   COLONOSCOPY WITH PROPOFOL  N/A 01/22/2023   Procedure: COLONOSCOPY WITH PROPOFOL ;  Surgeon: Legrand Victory LITTIE DOUGLAS, MD;  Location: [facility];  Service: Gastroenterology;  Laterality: N/A;   ESOPHAGOGASTRODUODENOSCOPY (EGD) WITH  PROPOFOL  N/A 11/10/2018   Procedure: ESOPHAGOGASTRODUODENOSCOPY (EGD) WITH PROPOFOL ;  Surgeon: Legrand Victory LITTIE DOUGLAS, MD;  Location: [facility];  Service: Gastroenterology;  Laterality: N/A;   EVALUATION UNDER ANESTHESIA WITH FISTULECTOMY N/A 04/20/2014   Procedure: EXAM UNDER ANESTHESIA ;  Surgeon: Bernarda Ned, MD;  Location: [facility];  Service: General;  Laterality: N/A;   FLEXIBLE SIGMOIDOSCOPY N/A 11/09/2013   Procedure: FLEXIBLE SIGMOIDOSCOPY;  Surgeon: Elsie Cree, MD;  Location: [facility];  Service: Endoscopy;  Laterality: N/A;   fupa removal      LAPAROSCOPIC CHOLECYSTECTOMY  2005   LAPAROSCOPIC GASTRIC SLEEVE RESECTION N/A 12/24/2020   Procedure: LAPAROSCOPIC GASTRIC SLEEVE RESECTION;  Surgeon: Signe Mitzie LABOR, MD;  Location: [facility];  Service: General;  Laterality: N/A;   REFRACTIVE SURGERY     SPHINCTEROTOMY N/A 04/20/2014   Procedure:  LATERAL INTERNAL SPHINCTEROTOMY;  Surgeon: Bernarda Ned, MD;  Location: [facility];  Service: General;  Laterality: N/A;   TRANSTHORACIC ECHOCARDIOGRAM   12/30/2012   mild LVH/  ef 55-60%   UNILATERAL SALPINGECTOMY  2009   laparotomy left salpingectomy-- ectopic preg.   UPPER GASTROINTESTINAL ENDOSCOPY     UPPER GI ENDOSCOPY N/A 12/24/2020   Procedure: UPPER GI ENDOSCOPY;  Surgeon: Signe Mitzie LABOR, MD;  Location: [facility];  Service: General;  Laterality: N/A;   VAGINAL HYSTERECTOMY N/A 01/06/2013   Procedure: HYSTERECTOMY VAGINAL;  Surgeon: Gloris LABOR Hugger, MD;  Location: [facility];  Service: Gynecology;  Laterality: N/A;   Past Obstetrical History:  OB History  Gravida Para Term Preterm AB Living  3 1 1  2 1   SAB IAB Ectopic Multiple Live Births   1 1      # Outcome Date GA Lbr Len/2nd Weight Sex Type Anes PTL Lv  3 Term 1992 [redacted]w[redacted]d    Vag-Spont        Birth Comments: cardiac arrest  2 Ectopic           1 IAB            Past Gynecological History: As per HPI.  Social History:  Social History   Socioeconomic History   Marital status: Married    Spouse name: Not on file   Number of children: 2   Years of education: Not on file   Highest education level: Some college, no degree  Occupational History   Occupation: disability  Tobacco Use   Smoking status: Former    Current packs/day: 0.00    Average packs/day: 0.2 packs/day for 11.0 years (2.2 ttl pk-yrs)    Types: Cigarettes    Start date: 11/18/2002    Quit date: 11/17/2013    Years since quitting: 10.2   Smokeless tobacco: Never   Tobacco comments:    2 years quit  Vaping Use   Vaping status: Never Used  Substance and Sexual Activity   Alcohol use: Not Currently    Comment: 1 bottle wine per day x3 years, quit 09/2023   Drug use: No   Sexual activity: Yes    Birth control/protection: Surgical    Comment: Hysterectomy  Other Topics Concern   Not on file  Social History Narrative   Not on file   Social Drivers of Health   Tobacco Use: Medium Risk (02/23/2024)   Patient History    Smoking Tobacco Use: Former    Smokeless Tobacco Use: Never    Passive Exposure:  Not on file  Financial Resource Strain: Low Risk (07/22/2023)   Overall Financial Resource  Strain (CARDIA)    Difficulty of Paying Living Expenses: Not hard at all  Food Insecurity: No Food Insecurity (02/23/2024)   Epic    Worried About Programme Researcher, Broadcasting/film/video in the Last Year: Never true    Ran Out of Food in the Last Year: Never true  Recent Concern: Food Insecurity - Food Insecurity Present (12/23/2023)   Epic    Worried About Programme Researcher, Broadcasting/film/video in the Last Year: Never true    Ran Out of Food in the Last Year: Sometimes true  Transportation Needs: No Transportation Needs (02/23/2024)   Epic    Lack of Transportation (Medical): No    Lack of Transportation (Non-Medical): No  Physical Activity: Insufficiently Active (07/22/2023)   Exercise Vital Sign    Days of Exercise per Week: 2 days    Minutes of Exercise per Session: 40 min  Stress: Stress Concern Present (07/22/2023)   [employer] of Occupational Health - Occupational Stress Questionnaire    Feeling of Stress : Very much  Social Connections: Socially Integrated (07/22/2023)   Social Connection and Isolation Panel    Frequency of Communication with Friends and Family: More than three times a week    Frequency of Social Gatherings with Friends and Family: Twice a week    Attends Religious Services: More than 4 times per year    Active Member of Clubs or Organizations: Yes    Attends Banker Meetings: More than 4 times per year    Marital Status: Married  Catering Manager Violence: Not At Risk (12/23/2023)   Epic    Fear of Current or Ex-Partner: No    Emotionally Abused: No    Physically Abused: No    Sexually Abused: No  Depression (PHQ2-9): Medium Risk (02/23/2024)   Depression (PHQ2-9)    PHQ-2 Score: 5  Alcohol Screen: Low Risk (07/22/2023)   Alcohol Screen    Last Alcohol Screening Score (AUDIT): 7  Housing: Low Risk (12/23/2023)   Epic    Unable to Pay for Housing in the Last Year: No    Number of Times Moved in  the Last Year: 0    Homeless in the Last Year: No  Utilities: Not At Risk (12/23/2023)   Epic    Threatened with loss of utilities: No  Health Literacy: Adequate Health Literacy (07/22/2023)   B1300 Health Literacy    Frequency of need for help with medical instructions: Never   Family History:  Family History  Problem Relation Age of Onset   Cancer Mother    Hypertension Mother    Diabetes Mother    Allergies Mother    Heart disease Mother    Clotting disorder Mother    Stroke Mother    Kidney disease Mother    Thyroid  disease Mother    Cervical cancer Mother    Cancer Father    Stroke Father    Pancreatic cancer Father 72   Hyperlipidemia Father    Hypertension Father    Liver disease Father    Schizophrenia Sister    Bipolar disorder Sister    Clotting disorder Sister    Brain cancer Sister    Cervical cancer Sister    Bipolar disorder Sister    Bipolar disorder Brother    Kidney disease Brother    Drug abuse Brother    Drug abuse Brother    Liver cancer Maternal Grandmother    Heart disease Maternal Grandmother    Colon cancer Paternal Grandfather 70  Colon cancer Paternal Uncle 42   Liver cancer Other    Colon polyps Neg Hx    Esophageal cancer Neg Hx    Stomach cancer Neg Hx    Health Maintenance:  Mammogram(s): Yes.   Date: July 2025 Colonoscopy: Yes.   Date: 01/2023  Medications Makayia E. Diaz-Terry had no medications administered during this visit. Current Outpatient Medications  Medication Sig Dispense Refill   Accu-Chek Softclix Lancets lancets Use as instructed 100 each 12   albuterol  (PROVENTIL  HFA) 108 (90 Base) MCG/ACT inhaler Inhale 2 puffs into the lungs every 6 (six) hours as needed for wheezing or shortness of breath. 6.7 g 2   albuterol  (PROVENTIL ) (2.5 MG/3ML) 0.083% nebulizer solution Take 3 mLs (2.5 mg total) by nebulization every 6 (six) hours as needed for wheezing or shortness of breath. 150 mL 1   atorvastatin  (LIPITOR) 40 MG tablet  Take 1 tablet (40 mg total) by mouth daily. 90 tablet 0   Blood Glucose Monitoring Suppl (ACCU-CHEK GUIDE) w/Device KIT Check blood sugars daily 1 kit 0   buPROPion  (WELLBUTRIN  XL) 150 MG 24 hr tablet Take 1 tablet (150 mg total) by mouth daily. 90 tablet 0   cyclobenzaprine  (FLEXERIL ) 10 MG tablet Take 10 mg by mouth 3 (three) times daily as needed.     DULoxetine  40 MG CPEP Take 1 capsule (40 mg total) by mouth daily. 90 capsule 1   EPINEPHrine  0.3 mg/0.3 mL IJ SOAJ injection Inject 0.3 mg into the muscle as needed for anaphylaxis. 1 each 1   glucose blood (ACCU-CHEK GUIDE TEST) test strip Use as instructed. Check blood glucose level by fingerstick 1-2 times per day. E11.65 100 each 12   hydrOXYzine  (VISTARIL ) 50 MG capsule Take 1 capsule (50 mg total) by mouth every 8 (eight) hours as needed. 90 capsule 1   Multiple Vitamins-Minerals (BARIATRIC MULTIVITAMINS/IRON PO) Take 1 tablet by mouth daily.     potassium chloride  SA (KLOR-CON  M) 20 MEQ tablet Take 2 tablets (40 mEq total) by mouth daily. 60 tablet 1   Semaglutide ,0.25 or 0.5MG /DOS, 2 MG/3ML SOPN Inject 0.25 mg into the skin once a week. 6 mL 0   Vitamin D , Ergocalciferol , (DRISDOL ) 1.25 MG (50000 UNIT) CAPS capsule Take 1 capsule (50,000 Units total) by mouth every 7 (seven) days. 12 capsule 0   SUMAtriptan  (IMITREX ) 100 MG tablet Take 1 tablet at the start of the headache. (Patient not taking: Reported on 02/23/2024) 12 tablet 3   topiramate  (TOPAMAX ) 25 MG tablet TAKE 1 TABLET BY MOUTH AT BEDTIME TO  DECREASE  MIGRAINE  FREQUENCY (Patient not taking: Reported on 02/23/2024) 90 tablet 0   No current facility-administered medications for this visit.   Allergies Asa [aspirin], Mushroom extract complex (obsolete), Penicillins, Shellfish allergy, Triamcinolone , and Zoloft  [sertraline ]  Physical Exam:  BP 109/78   Pulse 84   Wt 212 lb 11.2 oz (96.5 kg)   LMP 11/27/2012   BMI 35.40 kg/m  Body mass index is 35.4 kg/m. General appearance:  Well nourished, well developed female in no acute distress.  Respiratory:   Normal respiratory effort Abdomen: positive bowel sounds and no masses, hernias; diffusely non tender to palpation, non distended Neuro/Psych:  Normal mood and affect.  Skin:  Warm and dry.  Pelvic exam: VULVA: wnl, mild atrophy, VAGINA: normal appearing vagina with normal color and discharge, no lesions, mildly atrophy, cuff well healed, nttp CERVIX: surgically absent, UTERUS: surgically absent, vaginal cuff well healed, ADNEXA: normal adnexa in size, nontender and  no masses, exam chaperoned by CMA.  Laboratory: none  Radiology: none  Assessment: patient stable  Plan:  1. Well woman exam with routine gynecological exam (Primary) - Cervicovaginal ancillary only( [place])  2. S/P Total vaginal hysterectomy on 01/06/13 D/w her no need for pap smears as long as no future vaginal bleeding or spotting - US  Transvaginal Non-OB; Future  3. Encounter for screening for malignant neoplasm of breast, unspecified screening modality UTD. Repeat mammogram mid 2026  4. Vaginal pain D/w her potential etiologies. Recommend adding extra fiber to diet along with plenty of water . U/s recommended and can consider pelvic floor PT after u/s follow up. Also may consider vaginal estrogen - Cervicovaginal ancillary only( [place]) - US  Transvaginal Non-OB; Future  Return in about 6 weeks (around 04/05/2024) for 6wks, in person, with dr izell.  Future Appointments  Date Time Provider Department Center  02/24/2024  1:45 PM Jule Ronal CROME, PA-C [facility] None  02/25/2024  8:20 AM Beather Delon Gibson, PA [facility]  03/07/2024 11:00 AM [place] Adelita PENNER, MD [facility] None  03/09/2024  8:00 AM Jaycee Greig PARAS, NP [facility] [address]  04/12/2024  8:00 AM Jaycee Greig PARAS, NP [facility] [address]  07/12/2024  9:10 AM [facility] [place] MASH VISIT [facility] [address]  08/25/2024  9:00 AM Jaycee Greig PARAS, NP [facility] [address]     Bebe Izell Raddle MD Attending Center for [facility])

## 2024-02-24 ENCOUNTER — Other Ambulatory Visit (INDEPENDENT_AMBULATORY_CARE_PROVIDER_SITE_OTHER): Payer: Self-pay

## 2024-02-24 ENCOUNTER — Ambulatory Visit (INDEPENDENT_AMBULATORY_CARE_PROVIDER_SITE_OTHER): Admitting: Physician Assistant

## 2024-02-24 ENCOUNTER — Ambulatory Visit (HOSPITAL_COMMUNITY)
Admission: RE | Admit: 2024-02-24 | Discharge: 2024-02-24 | Disposition: A | Discharge status: Home / Self Care | Source: Ambulatory Visit | Attending: Obstetrics and Gynecology | Admitting: Obstetrics and Gynecology

## 2024-02-24 ENCOUNTER — Encounter: Payer: Self-pay | Admitting: Physician Assistant

## 2024-02-24 ENCOUNTER — Other Ambulatory Visit: Payer: Self-pay | Admitting: Obstetrics and Gynecology

## 2024-02-24 DIAGNOSIS — Z90710 Acquired absence of both cervix and uterus: Secondary | ICD-10-CM | POA: Diagnosis present

## 2024-02-24 DIAGNOSIS — M79642 Pain in left hand: Secondary | ICD-10-CM | POA: Diagnosis not present

## 2024-02-24 DIAGNOSIS — M25531 Pain in right wrist: Secondary | ICD-10-CM | POA: Diagnosis not present

## 2024-02-24 DIAGNOSIS — Z01419 Encounter for gynecological examination (general) (routine) without abnormal findings: Secondary | ICD-10-CM

## 2024-02-24 DIAGNOSIS — M79641 Pain in right hand: Secondary | ICD-10-CM

## 2024-02-24 DIAGNOSIS — M25532 Pain in left wrist: Secondary | ICD-10-CM | POA: Diagnosis not present

## 2024-02-24 DIAGNOSIS — R102 Pelvic and perineal pain unspecified side: Secondary | ICD-10-CM | POA: Diagnosis present

## 2024-02-24 DIAGNOSIS — Z1239 Encounter for other screening for malignant neoplasm of breast: Secondary | ICD-10-CM

## 2024-02-24 LAB — CERVICOVAGINAL ANCILLARY ONLY
Bacterial Vaginitis (gardnerella): NEGATIVE
Candida Glabrata: NEGATIVE
Candida Vaginitis: NEGATIVE
Chlamydia: NEGATIVE
Comment: NEGATIVE
Comment: NEGATIVE
Comment: NEGATIVE
Comment: NEGATIVE
Comment: NEGATIVE
Comment: NORMAL
Neisseria Gonorrhea: NEGATIVE
Trichomonas: NEGATIVE

## 2024-02-24 MED ORDER — PREDNISONE 5 MG (21) PO TBPK: ORAL_TABLET | ORAL | 0 refills | Status: DC

## 2024-02-24 NOTE — Progress Notes (Signed)
 "  Chief Complaint: Elevated liver enzymes and discuss EGD  HPI:    Marisa [name] is a 49 year old African-American female, known to Dr. Legrand, with a past medical history as listed below including GAD, GERD and IBS, who was referred to me by Jaycee Greig PARAS, NP for a complaint of elevated LFTs and to discuss an EGD.     11/10/2018 EGD was normal.  At that time I suspected that discomfort was caused by possible amount of anxiety.    06/2019 patient seen in office at that time discussed prior colonoscopy at [facility] with internal hemorrhoids.  Previous hemorrhoidectomy by colorectal surgery for anal fissure.  Patient had a colonoscopy at [facility] long due to history of difficult IV access in June 2018 which revealed diminutive transverse colon adenoma, diverticulosis and small internal hemorrhoids.    12/07/2022 patient seen by Dr. Legrand for rectal bleeding.  At that time discussed diarrhea and rectal bleeding.  At that time also noted some elevated LFTs with an AST of 163 and ALT of 143.  At that time discussed 2 to 3 glasses of wine every other day.  Liver enzymes eventually trended down to AST of 87 ALT of 117.  Patient was scheduled for a colonoscopy.  Commend hemorrhoid suppository use in the interim.    01/22/2023 colonoscopy done for chronic diarrhea and rectal bleeding.  At that time diverticulosis in the left colon in the right colon.  Internal hemorrhoids.  Discussed that if biopsies negative for microscopic colitis was consistent with IBS.  At that time noted that patient's peripheral IV was obtained on 1 attempt without the need for specialized vein finding devices.  Repeat recommended 10 years.  Pathology showed no specific changes.  At that time recommended she use dicyclomine  10 mg 3 times daily.    12/08/2023 CMP with an AST of 65 and ALT of 81 (normal 07/27/2023)    01/08/2024 AST normal at 31.  ALT minimally elevated at 36.  Discussed the use of AI scribe software for clinical note  transcription with the patient, who gave verbal consent to proceed.  History of Present Illness  Rectal bleeding has persisted for several months, with colonoscopy in December 2025 identifying internal hemorrhoids as the likely source. Suppositories were prescribed and used twice daily for approximately three months without improvement.  Rectal bleeding occurs several times daily, with a large amount of bright red blood in the toilet with every bowel movement. Associated symptoms include sharp anal pain during and after bowel movements, followed by throbbing discomfort. Pain is described as different from a prior fissure experienced years ago. No changes in stool size or consistency; bowel movements remain regular and soft with fiber supplementation. Suppositories have been continued up to this visit.  History of elevated liver enzymes, with the most recent ALT mildly elevated at 36 in late November 2025. Liver enzyme levels have fluctuated over the past 4 months, with previous values being higher. She has been told she has fatty liver and has a family history of liver cancer. She believes she may have had a liver ultrasound approximately six months ago. No prior liver workup per patient.  Denies fever, chills, weight loss, nausea, vomiting, abdominal pain or symptoms that awaken her from sleep.    Past Medical History:  Diagnosis Date   Abdominal pain 04/26/2013   Abnormal uterine bleeding (AUB) 10/11/2012   Acute bronchitis    Allergy    Anal pain    chronic   Anemia  Anxiety    Asthma    exacerbation 02-28-2014 and 02-23-2014 secondary to Rhinovirus   Atypical chest pain 04/26/2013   Back pain    Benign neoplasm of sigmoid colon    Benign neoplasm of transverse colon    Carbuncle of labium 07/12/2015   Chest pain 02/20/2014   Chronic diarrhea    Chronic headaches    Chronic low back pain    Cigarette nicotine dependence without complication 05/04/2014   Cyst of right ovary     Dandruff 03/26/2015   Diabetes mellitus without complication (HCC) 10/13/2013   Diabetic neuropathy, painful (HCC) 12/13/2019   Difficult intravenous access    PER PT NEEDS PICC LINE   Dyspnea 09/07/2012   Spiro 08/2012:  No obstruction by FEV1%, but probable restriction.     Falls 03/27/2014   Fatty liver    Food allergy    Mushrooms, shellfish   GAD (generalized anxiety disorder) 05/04/2014   Gait disturbance 04/11/2014   Gait instability    GERD (gastroesophageal reflux disease)    History of adenomatous polyp of colon    History of cardiac arrest    during SVD 1992   History of ectopic pregnancy    2009-  S/P LEFT SALPINGECTOMY   History of panic attacks    Hyperlipidemia    IBS (irritable bowel syndrome)    Insomnia 03/06/2014   Joint pain    Lower extremity edema    Lumbar stenosis L4 -- L5 with bulging disk   w/ right leg weakness/ decreased mobility   Migraine variant with headache 05/09/2014   Mild obstructive sleep apnea    study 03-20-2014  no cpap recommended   Nausea and vomiting 09/16/2022   Neuromuscular disorder (HCC)    neuropathy in feet - no current problems as of 10/06/23 per patient   Neuropathic pain of both legs 06/04/2016   Obesity (BMI 30-39.9) 03/05/2018   OSA (obstructive sleep apnea) 03/06/2014   does not use CPAP - mild OSA   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   Panniculitis 03/05/2018   Pelvic pain 07/25/2013   Persistent vomiting 04/27/2013   Pneumonia    x 1   PTSD (post-traumatic stress disorder) 05/04/2014   Rash and nonspecific skin eruption 07/12/2015   Rectal bleeding 07/25/2013   hx   RLQ abdominal pain    S/P Total vaginal hysterectomy on 01/06/13 01/06/2013   Sleep apnea    mild no cpap   SOB (shortness of breath)    Social anxiety disorder 05/04/2014   Sore throat 02/20/2014   Stomach ulcer    hx   Tachycardia 02/20/2014   Type 2 diabetes mellitus (HCC)    Vitamin D  deficiency    Weakness of right leg     FROM BACK PROBLEM PER PT - no current problem per patient on 10/06/23    Past Surgical History:  Procedure Laterality Date   ANTERIOR CERVICAL DECOMP/DISCECTOMY FUSION N/A 10/09/2023   Procedure: ANTERIOR CERVICAL DECOMPRESSION/DISCECTOMY FUSION 1 LEVEL;  Surgeon: Gillie Duncans, MD;  Location: [facility];  Service: Neurosurgery;  Laterality: N/A;  ACDF - C4-C5   BIOPSY  01/22/2023   Procedure: BIOPSY;  Surgeon: Legrand Victory LITTIE DOUGLAS, MD;  Location: [facility];  Service: Gastroenterology;;   COLONOSCOPY Left 04/29/2013   Procedure: COLONOSCOPY;  Surgeon: Elsie Cree, MD;  Location: [facility];  Service: Endoscopy;  Laterality: Left;   COLONOSCOPY WITH PROPOFOL  N/A 08/01/2016   Procedure: COLONOSCOPY WITH PROPOFOL ;  Surgeon: Legrand Victory LITTIE DOUGLAS, MD;  Location: [facility];  Service: Gastroenterology;  Laterality: N/A;   COLONOSCOPY WITH PROPOFOL  N/A 01/22/2023   Procedure: COLONOSCOPY WITH PROPOFOL ;  Surgeon: Legrand Victory LITTIE DOUGLAS, MD;  Location: [facility];  Service: Gastroenterology;  Laterality: N/A;   ESOPHAGOGASTRODUODENOSCOPY (EGD) WITH PROPOFOL  N/A 11/10/2018   Procedure: ESOPHAGOGASTRODUODENOSCOPY (EGD) WITH PROPOFOL ;  Surgeon: Legrand Victory LITTIE DOUGLAS, MD;  Location: [facility];  Service: Gastroenterology;  Laterality: N/A;   EVALUATION UNDER ANESTHESIA WITH FISTULECTOMY N/A 04/20/2014   Procedure: EXAM UNDER ANESTHESIA ;  Surgeon: Bernarda Ned, MD;  Location: [facility];  Service: General;  Laterality: N/A;   FLEXIBLE SIGMOIDOSCOPY N/A 11/09/2013   Procedure: FLEXIBLE SIGMOIDOSCOPY;  Surgeon: Elsie Cree, MD;  Location: [facility];  Service: Endoscopy;  Laterality: N/A;   fupa removal      LAPAROSCOPIC CHOLECYSTECTOMY  2005   LAPAROSCOPIC GASTRIC SLEEVE RESECTION N/A 12/24/2020   Procedure: LAPAROSCOPIC GASTRIC SLEEVE RESECTION;  Surgeon: Signe Mitzie LABOR, MD;  Location: [facility];  Service: General;  Laterality: N/A;   REFRACTIVE SURGERY     SPHINCTEROTOMY N/A  04/20/2014   Procedure:  LATERAL INTERNAL SPHINCTEROTOMY;  Surgeon: Bernarda Ned, MD;  Location: [facility];  Service: General;  Laterality: N/A;   TRANSTHORACIC ECHOCARDIOGRAM  12/30/2012   mild LVH/  ef 55-60%   UNILATERAL SALPINGECTOMY  2009   laparotomy left salpingectomy-- ectopic preg.   UPPER GASTROINTESTINAL ENDOSCOPY     UPPER GI ENDOSCOPY N/A 12/24/2020   Procedure: UPPER GI ENDOSCOPY;  Surgeon: Signe Mitzie LABOR, MD;  Location: [facility];  Service: General;  Laterality: N/A;   VAGINAL HYSTERECTOMY N/A 01/06/2013   Procedure: HYSTERECTOMY VAGINAL;  Surgeon: Gloris LABOR Hugger, MD;  Location: [facility];  Service: Gynecology;  Laterality: N/A;    Current Outpatient Medications  Medication Sig Dispense Refill   Accu-Chek Softclix Lancets lancets Use as instructed 100 each 12   albuterol  (PROVENTIL  HFA) 108 (90 Base) MCG/ACT inhaler Inhale 2 puffs into the lungs every 6 (six) hours as needed for wheezing or shortness of breath. 6.7 g 2   albuterol  (PROVENTIL ) (2.5 MG/3ML) 0.083% nebulizer solution Take 3 mLs (2.5 mg total) by nebulization every 6 (six) hours as needed for wheezing or shortness of breath. 150 mL 1   atorvastatin  (LIPITOR) 40 MG tablet Take 1 tablet (40 mg total) by mouth daily. 90 tablet 0   Blood Glucose Monitoring Suppl (ACCU-CHEK GUIDE) w/Device KIT Check blood sugars daily 1 kit 0   buPROPion  (WELLBUTRIN  XL) 150 MG 24 hr tablet Take 1 tablet (150 mg total) by mouth daily. 90 tablet 0   cyclobenzaprine  (FLEXERIL ) 10 MG tablet Take 10 mg by mouth 3 (three) times daily as needed.     DULoxetine  40 MG CPEP Take 1 capsule (40 mg total) by mouth daily. 90 capsule 1   EPINEPHrine  0.3 mg/0.3 mL IJ SOAJ injection Inject 0.3 mg into the muscle as needed for anaphylaxis. 1 each 1   glucose blood (ACCU-CHEK GUIDE TEST) test strip Use as instructed. Check blood glucose level by fingerstick 1-2 times per day. E11.65 100 each 12   hydrOXYzine  (VISTARIL ) 50 MG capsule Take  1 capsule (50 mg total) by mouth every 8 (eight) hours as needed. 90 capsule 1   Multiple Vitamins-Minerals (BARIATRIC MULTIVITAMINS/IRON PO) Take 1 tablet by mouth daily.     potassium chloride  SA (KLOR-CON  M) 20 MEQ tablet Take 2 tablets (40 mEq total) by mouth daily. 60 tablet 1   psyllium (KONSYL) 33 % POWD Half tablespoon  per day. Drink plenty of water  during the day     Semaglutide ,0.25 or 0.5MG /DOS, 2 MG/3ML SOPN Inject 0.25 mg into the skin once a week. 6 mL 0   SUMAtriptan  (IMITREX ) 100 MG tablet Take 1 tablet at the start of the headache. (Patient not taking: Reported on 02/23/2024) 12 tablet 3   topiramate  (TOPAMAX ) 25 MG tablet TAKE 1 TABLET BY MOUTH AT BEDTIME TO  DECREASE  MIGRAINE  FREQUENCY (Patient not taking: Reported on 02/23/2024) 90 tablet 0   Vitamin D , Ergocalciferol , (DRISDOL ) 1.25 MG (50000 UNIT) CAPS capsule Take 1 capsule (50,000 Units total) by mouth every 7 (seven) days. 12 capsule 0   No current facility-administered medications for this visit.    Allergies as of 02/25/2024 - Review Complete 02/24/2024  Allergen Reaction Noted   Asa [aspirin] Anaphylaxis and Hives 07/11/2016   Mushroom extract complex (obsolete) Anaphylaxis, Swelling, and Other (See Comments) 09/10/2015   Penicillins Anaphylaxis 10/25/2011   Shellfish allergy Anaphylaxis 10/25/2011   Triamcinolone  Other (See Comments) 07/11/2016   Zoloft  [sertraline ]  12/18/2022    Family History  Problem Relation Age of Onset   Cancer Mother    Hypertension Mother    Diabetes Mother    Allergies Mother    Heart disease Mother    Clotting disorder Mother    Stroke Mother    Kidney disease Mother    Thyroid  disease Mother    Cervical cancer Mother    Cancer Father    Stroke Father    Pancreatic cancer Father 32   Hyperlipidemia Father    Hypertension Father    Liver disease Father    Schizophrenia Sister    Bipolar disorder Sister    Clotting disorder Sister    Brain cancer Sister    Cervical  cancer Sister    Bipolar disorder Sister    Bipolar disorder Brother    Kidney disease Brother    Drug abuse Brother    Drug abuse Brother    Liver cancer Maternal Grandmother    Heart disease Maternal Grandmother    Colon cancer Paternal Grandfather 66   Colon cancer Paternal Uncle 51   Liver cancer Other    Colon polyps Neg Hx    Esophageal cancer Neg Hx    Stomach cancer Neg Hx     Social History   Socioeconomic History   Marital status: Married    Spouse name: Not on file   Number of children: 2   Years of education: Not on file   Highest education level: Some college, no degree  Occupational History   Occupation: disability  Tobacco Use   Smoking status: Former    Current packs/day: 0.00    Average packs/day: 0.2 packs/day for 11.0 years (2.2 ttl pk-yrs)    Types: Cigarettes    Start date: 11/18/2002    Quit date: 11/17/2013    Years since quitting: 10.2   Smokeless tobacco: Never   Tobacco comments:    2 years quit  Vaping Use   Vaping status: Never Used  Substance and Sexual Activity   Alcohol use: Not Currently    Comment: 1 bottle wine per day x3 years, quit 09/2023   Drug use: No   Sexual activity: Yes    Birth control/protection: Surgical    Comment: Hysterectomy  Other Topics Concern   Not on file  Social History Narrative   Not on file   Social Drivers of Health   Tobacco Use: Medium Risk (02/24/2024)  Patient History    Smoking Tobacco Use: Former    Smokeless Tobacco Use: Never    Passive Exposure: Not on file  Financial Resource Strain: Low Risk (07/22/2023)   Overall Financial Resource Strain (CARDIA)    Difficulty of Paying Living Expenses: Not hard at all  Food Insecurity: No Food Insecurity (02/23/2024)   Epic    Worried About Programme Researcher, Broadcasting/film/video in the Last Year: Never true    Ran Out of Food in the Last Year: Never true  Recent Concern: Food Insecurity - Food Insecurity Present (12/23/2023)   Epic    Worried About Programme Researcher, Broadcasting/film/video in  the Last Year: Never true    Ran Out of Food in the Last Year: Sometimes true  Transportation Needs: No Transportation Needs (02/23/2024)   Epic    Lack of Transportation (Medical): No    Lack of Transportation (Non-Medical): No  Physical Activity: Insufficiently Active (07/22/2023)   Exercise Vital Sign    Days of Exercise per Week: 2 days    Minutes of Exercise per Session: 40 min  Stress: Stress Concern Present (07/22/2023)   [employer] of Occupational Health - Occupational Stress Questionnaire    Feeling of Stress : Very much  Social Connections: Socially Integrated (07/22/2023)   Social Connection and Isolation Panel    Frequency of Communication with Friends and Family: More than three times a week    Frequency of Social Gatherings with Friends and Family: Twice a week    Attends Religious Services: More than 4 times per year    Active Member of Clubs or Organizations: Yes    Attends Banker Meetings: More than 4 times per year    Marital Status: Married  Catering Manager Violence: Not At Risk (12/23/2023)   Epic    Fear of Current or Ex-Partner: No    Emotionally Abused: No    Physically Abused: No    Sexually Abused: No  Depression (PHQ2-9): Medium Risk (02/23/2024)   Depression (PHQ2-9)    PHQ-2 Score: 5  Alcohol Screen: Low Risk (07/22/2023)   Alcohol Screen    Last Alcohol Screening Score (AUDIT): 7  Housing: Low Risk (12/23/2023)   Epic    Unable to Pay for Housing in the Last Year: No    Number of Times Moved in the Last Year: 0    Homeless in the Last Year: No  Utilities: Not At Risk (12/23/2023)   Epic    Threatened with loss of utilities: No  Health Literacy: Adequate Health Literacy (07/22/2023)   B1300 Health Literacy    Frequency of need for help with medical instructions: Never    Review of Systems:    Constitutional: No weight loss, fever or chills Cardiovascular: No chest pain Respiratory: No SOB  Gastrointestinal: See HPI and otherwise  negative   Physical Exam:  Vital signs: BP 119/70   Pulse 97   Ht 5' 5 (1.651 m)   Wt 215 lb (97.5 kg)   LMP 11/27/2012   BMI 35.78 kg/m    Constitutional:   Pleasant overweight AA female appears to be in NAD, Well developed, Well nourished, alert and cooperative Respiratory: Respirations even and unlabored. Lungs clear to auscultation bilaterally.   No wheezes, crackles, or rhonchi.  Cardiovascular: Normal S1, S2. No MRG. Regular rate and rhythm. No peripheral edema, cyanosis or pallor.  Gastrointestinal:  Soft, nondistended, nontender. No rebound or guarding. Normal bowel sounds. No appreciable masses or hepatomegaly. Rectal:  External: large  posterior fissure, tender to palpation; Internal: not done due to discomfort Psychiatric: Demonstrates good judgement and reason without abnormal affect or behaviors.  RELEVANT LABS AND IMAGING: CBC    Component Value Date/Time   WBC 7.8 10/06/2023 0900   RBC 4.58 10/06/2023 0900   HGB 15.2 (H) 10/06/2023 0900   HGB 14.6 01/02/2021 1116   HCT 45.7 10/06/2023 0900   HCT 43.4 01/02/2021 1116   PLT 377 10/06/2023 0900   PLT 433 01/02/2021 1116   MCV 99.8 10/06/2023 0900   MCV 92 01/02/2021 1116   MCH 33.2 10/06/2023 0900   MCHC 33.3 10/06/2023 0900   RDW 13.1 10/06/2023 0900   RDW 11.9 01/02/2021 1116   LYMPHSABS 4.0 06/30/2023 2026   LYMPHSABS 3.0 01/02/2021 1116   MONOABS 0.5 06/30/2023 2026   EOSABS 0.1 06/30/2023 2026   EOSABS 0.2 01/02/2021 1116   BASOSABS 0.0 06/30/2023 2026   BASOSABS 0.1 01/02/2021 1116    CMP     Component Value Date/Time   NA 142 12/08/2023 1046   K 4.2 12/08/2023 1046   CL 106 12/08/2023 1046   CO2 23 12/08/2023 1046   GLUCOSE 87 12/08/2023 1046   GLUCOSE 117 (H) 10/06/2023 0900   BUN 8 12/08/2023 1046   CREATININE 0.64 12/08/2023 1046   CREATININE 0.60 09/29/2013 1028   CALCIUM  10.0 12/08/2023 1046   PROT 7.6 12/08/2023 1046   ALBUMIN  4.5 12/08/2023 1046   AST 31 01/08/2024 0908   ALT 36  (H) 01/08/2024 0908   ALKPHOS 80 12/08/2023 1046   BILITOT 0.4 12/08/2023 1046   GFRNONAA >60 10/06/2023 0900   GFRNONAA >89 09/29/2013 1028   GFRAA 121 11/25/2019 1040   GFRAA >89 09/29/2013 1028    Assessment & Plan Anal fissure Acute anal fissure causing significant pain and bleeding, potentially secondary to recent suppository use. Expected to heal with topical therapy and supportive measures. - Prescribed topical diltiazem cream, to be applied three times daily for 6-8 weeks. - Recommended over-the-counter Recticare with lidocaine  prn for symptomatic relief, especially prior to defecation. - Advised warm water  sitz baths for 15-20 minutes, 2-3 times daily. - Instructed her to discontinue suppository use. - Advised continuation of fiber supplementation to maintain soft stools. - Scheduled follow-up in approximately two months to reassess healing and determine need for further intervention.  Internal hemorrhoids Chronic internal hemorrhoids previously confirmed on colonoscopy. Current symptoms attributed to anal fissure; intervention deferred until reassessment post-fissure treatment. - Deferred hemorrhoid banding to prioritize fissure healing. - Plan to reassess need for hemorrhoid banding at follow-up if symptoms persist after fissure treatment.  Fatty liver with elevated liver enzymes Family history of liver cancer increases risk, but recent downward trend in enzymes and no concerning findings on review. Further evaluation indicated to exclude other etiologies. - Ordered repeat liver enzyme panel to reassess current status as well as liver workup including CBC, CMP, PT/INR, IgA, TTG, iron studies with ferritin, AMA, ANA, ASMA, alpha-1 antitrypsin, ceruloplasmin and hepatitis serologies - Reviewed chart for prior liver ultrasound, but patient has not actually had 1 done.  She has had various other ultrasounds transvaginally excetra but never had an ultrasound of her liver.  Will order  a right upper quadrant ultrasound today.   Patient to follow in clinic with me in 2 to 3 months.  We can reassess fissure and need for possible hemorrhoid banding as well as review liver workup.    Delon Failing, PA-C [facility] 02/24/2024, 1:23 PM  Cc: Jaycee, Amy  J, NP  "

## 2024-02-24 NOTE — Progress Notes (Signed)
 "  Office Visit Note   Patient: Marisa [name]           Date of Birth: 05/06/1975           MRN: [id number] Visit Date: 02/24/2024              Requested by: Jaycee Greig PARAS, NP [address] [place],  [place] [address] PCP: Jaycee Greig PARAS, NP   Assessment & Plan: Visit Diagnoses:  1. Bilateral wrist pain     Plan: Impression is bilateral wrist sprains.  It Is hard to localize any 1 place where her pain is originating from.  We have discussed starting on a steroid taper and wearing her wrist splints.  If her symptoms do not improve or localize to a specific place, she will follow-up with us .  Otherwise, follow-up as needed.  Follow-Up Instructions: Return if symptoms worsen or fail to improve.   Orders:  Orders Placed This Encounter  Procedures   XR Wrist Complete Left   XR Wrist Complete Right   Meds ordered this encounter  Medications   predniSONE  (STERAPRED UNI-PAK 21 TAB) 5 MG (21) TBPK tablet    Sig: Take as directed.  Please keep a close eye on blood sugar while on this medicine and a few days after finishing    Dispense:  21 tablet    Refill:  0      Procedures: No procedures performed   Clinical Data: No additional findings.   Subjective: Chief Complaint  Patient presents with   Right Wrist - Pain   Left Wrist - Pain    HPI patient is a pleasant 49 year old female who comes in today with bilateral wrist pain.  She notes she sustained mechanical fall landing on outstretched hand both wrist about a month ago.  She has had increased pain throughout the entire wrist on both sides since.  Pain is described as a stabbing pain worse with wrist extension.  She has been taking Tylenol  and NSAIDs without relief.  Of note, she was seen in our office back in April for questionable carpal tunnel syndrome where cortisone injections were performed.  She has since been seen by Dr. Gillie and has undergone ACDF C4-5 which was likely causing the  symptoms.  Review of Systems as detailed in HPI.  All others reviewed and are negative.   Objective: Vital Signs: LMP 11/27/2012   Physical Exam well-developed well-nourished female in no acute distress.  Alert and oriented x 3.  Ortho Exam bilateral wrist exam: She has diffuse pain across the dorsum of the wrist to include the distal radius and ulnar styloid.  Increased pain with wrist extension greater than flexion.  She has pain over the first dorsal compartment with a positive Terrilee.  Decree sensation to the thumb and index fingers.  Specialty Comments:  No specialty comments available.  Imaging: XR Wrist Complete Right Result Date: 02/24/2024 No acute or structural abnormalities  XR Wrist Complete Left Result Date: 02/24/2024 X-rays demonstrate remote avulsion to the ulnar styloid.  No other acute or structural abnormalities    PMFS History: Patient Active Problem List   Diagnosis Date Noted   Genetic testing 01/25/2024   Cervical cord compression with myelopathy (HCC) 10/09/2023   Positive blood culture 12/13/2022   Bacteremia 09/16/2022   Nausea and vomiting 09/16/2022   Vaginal itching 09/15/2022   Lactic acidosis 09/15/2022   GI bleed 09/14/2022   Colitis 09/14/2022   Elevated liver enzymes 09/14/2022  Elevated MCV 09/14/2022   Hypokalemia 09/14/2022   Multinodular thyroid  03/01/2021   Solitary pulmonary nodule 02/21/2021   Frequent falls 02/21/2021   Weakness of both lower extremities 02/21/2021   Carpal tunnel syndrome of right wrist 02/21/2021   Acute constipation 02/21/2021   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   Morbid obesity (HCC) 12/24/2020   Allergic rhinitis 06/26/2020   Hyperlipidemia associated with type 2 diabetes mellitus (HCC) 04/10/2020   Chronic pain syndrome 04/09/2020   Type 2 diabetes mellitus with diabetic neuropathy, without long-term current use of insulin  (HCC) 04/09/2020   Displacement of intervertebral disc of high  cervical region 12/13/2019   Vitamin D  deficiency 04/05/2019   Class 2 severe obesity with serious comorbidity and body mass index (BMI) of 36.0 to 36.9 in adult 04/05/2019   Obesity (BMI 30-39.9) 03/05/2018   Chronic diarrhea    Benign neoplasm of transverse colon    Benign neoplasm of sigmoid colon    Neuropathic pain of both legs 06/04/2016   Rash and nonspecific skin eruption 07/12/2015   Dandruff 03/26/2015   Migraine variant with headache 05/09/2014   GAD (generalized anxiety disorder) 05/04/2014   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   Social anxiety disorder 05/04/2014   PTSD (post-traumatic stress disorder) 05/04/2014   Depression 03/27/2014   OSA (obstructive sleep apnea) 03/06/2014   Insomnia 03/06/2014   History of cardiac arrest    Chest pain 02/20/2014   Rectal bleeding 07/25/2013   S/P Total vaginal hysterectomy on 01/06/13 01/06/2013   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   Past Medical History:  Diagnosis Date   Abdominal pain 04/26/2013   Abnormal uterine bleeding (AUB) 10/11/2012   Acute bronchitis    Allergy    Anal pain    chronic   Anemia    Anxiety    Asthma    exacerbation 02-28-2014 and 02-23-2014 secondary to Rhinovirus   Atypical chest pain 04/26/2013   Back pain    Benign neoplasm of sigmoid colon    Benign neoplasm of transverse colon    Carbuncle of labium 07/12/2015   Chest pain 02/20/2014   Chronic diarrhea    Chronic headaches    Chronic low back pain    Cigarette nicotine dependence without complication 05/04/2014   Cyst of right ovary    Dandruff 03/26/2015   Diabetes mellitus without complication (HCC) 10/13/2013   Diabetic neuropathy, painful (HCC) 12/13/2019   Difficult intravenous access    PER PT NEEDS PICC LINE   Dyspnea 09/07/2012   Spiro 08/2012:  No obstruction by FEV1%, but probable restriction.     Falls 03/27/2014   Fatty liver    Food allergy    Mushrooms, shellfish   GAD (generalized anxiety disorder) 05/04/2014    Gait disturbance 04/11/2014   Gait instability    GERD (gastroesophageal reflux disease)    History of adenomatous polyp of colon    History of cardiac arrest    during SVD 1992   History of ectopic pregnancy    2009-  S/P LEFT SALPINGECTOMY   History of panic attacks    Hyperlipidemia    IBS (irritable bowel syndrome)    Insomnia 03/06/2014   Joint pain    Lower extremity edema    Lumbar stenosis L4 -- L5 with bulging disk   w/ right leg weakness/ decreased mobility   Migraine variant with headache 05/09/2014   Mild obstructive sleep apnea    study 03-20-2014  no cpap recommended   Nausea and vomiting 09/16/2022  Neuromuscular disorder (HCC)    neuropathy in feet - no current problems as of 10/06/23 per patient   Neuropathic pain of both legs 06/04/2016   Obesity (BMI 30-39.9) 03/05/2018   OSA (obstructive sleep apnea) 03/06/2014   does not use CPAP - mild OSA   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   Panniculitis 03/05/2018   Pelvic pain 07/25/2013   Persistent vomiting 04/27/2013   Pneumonia    x 1   PTSD (post-traumatic stress disorder) 05/04/2014   Rash and nonspecific skin eruption 07/12/2015   Rectal bleeding 07/25/2013   hx   RLQ abdominal pain    S/P Total vaginal hysterectomy on 01/06/13 01/06/2013   Sleep apnea    mild no cpap   SOB (shortness of breath)    Social anxiety disorder 05/04/2014   Sore throat 02/20/2014   Stomach ulcer    hx   Tachycardia 02/20/2014   Type 2 diabetes mellitus (HCC)    Vitamin D  deficiency    Weakness of right leg    FROM BACK PROBLEM PER PT - no current problem per patient on 10/06/23    Family History  Problem Relation Age of Onset   Cancer Mother    Hypertension Mother    Diabetes Mother    Allergies Mother    Heart disease Mother    Clotting disorder Mother    Stroke Mother    Kidney disease Mother    Thyroid  disease Mother    Cervical cancer Mother    Cancer Father    Stroke Father    Pancreatic cancer  Father 35   Hyperlipidemia Father    Hypertension Father    Liver disease Father    Schizophrenia Sister    Bipolar disorder Sister    Clotting disorder Sister    Brain cancer Sister    Cervical cancer Sister    Bipolar disorder Sister    Bipolar disorder Brother    Kidney disease Brother    Drug abuse Brother    Drug abuse Brother    Liver cancer Maternal Grandmother    Heart disease Maternal Grandmother    Colon cancer Paternal Grandfather 5   Colon cancer Paternal Uncle 26   Liver cancer Other    Colon polyps Neg Hx    Esophageal cancer Neg Hx    Stomach cancer Neg Hx     Past Surgical History:  Procedure Laterality Date   ANTERIOR CERVICAL DECOMP/DISCECTOMY FUSION N/A 10/09/2023   Procedure: ANTERIOR CERVICAL DECOMPRESSION/DISCECTOMY FUSION 1 LEVEL;  Surgeon: Gillie Duncans, MD;  Location: [facility];  Service: Neurosurgery;  Laterality: N/A;  ACDF - C4-C5   BIOPSY  01/22/2023   Procedure: BIOPSY;  Surgeon: Legrand Victory LITTIE DOUGLAS, MD;  Location: [facility];  Service: Gastroenterology;;   COLONOSCOPY Left 04/29/2013   Procedure: COLONOSCOPY;  Surgeon: Elsie Cree, MD;  Location: [facility];  Service: Endoscopy;  Laterality: Left;   COLONOSCOPY WITH PROPOFOL  N/A 08/01/2016   Procedure: COLONOSCOPY WITH PROPOFOL ;  Surgeon: Legrand Victory LITTIE DOUGLAS, MD;  Location: [facility];  Service: Gastroenterology;  Laterality: N/A;   COLONOSCOPY WITH PROPOFOL  N/A 01/22/2023   Procedure: COLONOSCOPY WITH PROPOFOL ;  Surgeon: Legrand Victory LITTIE DOUGLAS, MD;  Location: [facility];  Service: Gastroenterology;  Laterality: N/A;   ESOPHAGOGASTRODUODENOSCOPY (EGD) WITH PROPOFOL  N/A 11/10/2018   Procedure: ESOPHAGOGASTRODUODENOSCOPY (EGD) WITH PROPOFOL ;  Surgeon: Legrand Victory LITTIE DOUGLAS, MD;  Location: [facility];  Service: Gastroenterology;  Laterality: N/A;   EVALUATION UNDER ANESTHESIA WITH FISTULECTOMY N/A 04/20/2014  Procedure: EXAM UNDER ANESTHESIA ;  Surgeon: Bernarda Ned, MD;  Location: [facility];  Service: General;  Laterality: N/A;   FLEXIBLE SIGMOIDOSCOPY N/A 11/09/2013   Procedure: FLEXIBLE SIGMOIDOSCOPY;  Surgeon: Elsie Cree, MD;  Location: [facility];  Service: Endoscopy;  Laterality: N/A;   fupa removal      LAPAROSCOPIC CHOLECYSTECTOMY  2005   LAPAROSCOPIC GASTRIC SLEEVE RESECTION N/A 12/24/2020   Procedure: LAPAROSCOPIC GASTRIC SLEEVE RESECTION;  Surgeon: Signe Mitzie LABOR, MD;  Location: [facility];  Service: General;  Laterality: N/A;   REFRACTIVE SURGERY     SPHINCTEROTOMY N/A 04/20/2014   Procedure:  LATERAL INTERNAL SPHINCTEROTOMY;  Surgeon: Bernarda Ned, MD;  [facility];  Service: General;  Laterality: N/A;   TRANSTHORACIC ECHOCARDIOGRAM  12/30/2012   mild LVH/  ef 55-60%   UNILATERAL SALPINGECTOMY  2009   laparotomy left salpingectomy-- ectopic preg.   UPPER GASTROINTESTINAL ENDOSCOPY     UPPER GI ENDOSCOPY N/A 12/24/2020   Procedure: UPPER GI ENDOSCOPY;  Surgeon: Signe Mitzie LABOR, MD;  Location: [facility];  Service: General;  Laterality: N/A;   VAGINAL HYSTERECTOMY N/A 01/06/2013   Procedure: HYSTERECTOMY VAGINAL;  Surgeon: Gloris LABOR Hugger, MD;  Location: [facility];  Service: Gynecology;  Laterality: N/A;   Social History   Occupational History   Occupation: disability  Tobacco Use   Smoking status: Former    Current packs/day: 0.00    Average packs/day: 0.2 packs/day for 11.0 years (2.2 ttl pk-yrs)    Types: Cigarettes    Start date: 11/18/2002    Quit date: 11/17/2013    Years since quitting: 10.2   Smokeless tobacco: Never   Tobacco comments:    2 years quit  Vaping Use   Vaping status: Never Used  Substance and Sexual Activity   Alcohol use: Not Currently    Comment: 1 bottle wine per day x3 years, quit 09/2023   Drug use: No   Sexual activity: Yes    Birth control/protection: Surgical    Comment: Hysterectomy        "

## 2024-02-25 ENCOUNTER — Other Ambulatory Visit (INDEPENDENT_AMBULATORY_CARE_PROVIDER_SITE_OTHER)

## 2024-02-25 ENCOUNTER — Ambulatory Visit (INDEPENDENT_AMBULATORY_CARE_PROVIDER_SITE_OTHER): Admitting: Physician Assistant

## 2024-02-25 ENCOUNTER — Encounter: Payer: Self-pay | Admitting: Family

## 2024-02-25 ENCOUNTER — Encounter: Payer: Self-pay | Admitting: Physician Assistant

## 2024-02-25 VITALS — BP 119/70 | HR 97 | Ht 65.0 in | Wt 215.0 lb

## 2024-02-25 DIAGNOSIS — K625 Hemorrhage of anus and rectum: Secondary | ICD-10-CM | POA: Diagnosis not present

## 2024-02-25 DIAGNOSIS — K602 Anal fissure, unspecified: Secondary | ICD-10-CM | POA: Diagnosis not present

## 2024-02-25 DIAGNOSIS — K600 Acute anal fissure: Secondary | ICD-10-CM

## 2024-02-25 DIAGNOSIS — K760 Fatty (change of) liver, not elsewhere classified: Secondary | ICD-10-CM | POA: Diagnosis not present

## 2024-02-25 DIAGNOSIS — R748 Abnormal levels of other serum enzymes: Secondary | ICD-10-CM

## 2024-02-25 DIAGNOSIS — R7989 Other specified abnormal findings of blood chemistry: Secondary | ICD-10-CM | POA: Diagnosis not present

## 2024-02-25 DIAGNOSIS — K648 Other hemorrhoids: Secondary | ICD-10-CM | POA: Diagnosis not present

## 2024-02-25 LAB — CBC WITH DIFFERENTIAL/PLATELET
Basophils Absolute: 0.1 K/uL (ref 0.0–0.1)
Basophils Relative: 1.2 % (ref 0.0–3.0)
Eosinophils Absolute: 0.1 K/uL (ref 0.0–0.7)
Eosinophils Relative: 3.2 % (ref 0.0–5.0)
HCT: 38.8 % (ref 36.0–46.0)
Hemoglobin: 13.1 g/dL (ref 12.0–15.0)
Lymphocytes Relative: 50.5 % — ABNORMAL HIGH (ref 12.0–46.0)
Lymphs Abs: 2.2 K/uL (ref 0.7–4.0)
MCHC: 33.7 g/dL (ref 30.0–36.0)
MCV: 95.6 fl (ref 78.0–100.0)
Monocytes Absolute: 0.4 K/uL (ref 0.1–1.0)
Monocytes Relative: 9.3 % (ref 3.0–12.0)
Neutro Abs: 1.5 K/uL (ref 1.4–7.7)
Neutrophils Relative %: 35.8 % — ABNORMAL LOW (ref 43.0–77.0)
Platelets: 372 K/uL (ref 150.0–400.0)
RBC: 4.06 Mil/uL (ref 3.87–5.11)
RDW: 13 % (ref 11.5–15.5)
WBC: 4.3 K/uL (ref 4.0–10.5)

## 2024-02-25 LAB — COMPREHENSIVE METABOLIC PANEL WITH GFR
ALT: 31 U/L (ref 3–35)
AST: 21 U/L (ref 5–37)
Albumin: 4.1 g/dL (ref 3.5–5.2)
Alkaline Phosphatase: 70 U/L (ref 39–117)
BUN: 10 mg/dL (ref 6–23)
CO2: 28 meq/L (ref 19–32)
Calcium: 9.5 mg/dL (ref 8.4–10.5)
Chloride: 105 meq/L (ref 96–112)
Creatinine, Ser: 0.73 mg/dL (ref 0.40–1.20)
GFR: 96.47 mL/min
Glucose, Bld: 86 mg/dL (ref 70–99)
Potassium: 4.3 meq/L (ref 3.5–5.1)
Sodium: 139 meq/L (ref 135–145)
Total Bilirubin: 0.5 mg/dL (ref 0.2–1.2)
Total Protein: 7.1 g/dL (ref 6.0–8.3)

## 2024-02-25 LAB — IBC + FERRITIN
Ferritin: 168.3 ng/mL (ref 10.0–291.0)
Iron: 103 ug/dL (ref 42–145)
Saturation Ratios: 26.2 % (ref 20.0–50.0)
TIBC: 393.4 ug/dL (ref 250.0–450.0)
Transferrin: 281 mg/dL (ref 212.0–360.0)

## 2024-02-25 LAB — PROTIME-INR
INR: 1.1 ratio — ABNORMAL HIGH (ref 0.8–1.0)
Prothrombin Time: 11.2 s (ref 9.6–13.1)

## 2024-02-25 MED ORDER — AMBULATORY NON FORMULARY MEDICATION: 0 refills | Status: AC

## 2024-02-25 NOTE — Patient Instructions (Addendum)
 Your provider has requested that you go to the basement level for lab work before leaving today. Press B on the elevator. The lab is located at the first door on the left as you exit the elevator.  We have sent a prescription for Diltiazem/ Lidocaine  gel to [facility]. Apply a pea size amount 1/2 to 1 inch inside rectum 3 times daily x 6-8  week  [facility] information is below: Address: [address] [place], [place] [address]  [phone]  *Please DO NOT go directly from our office to pick up this medication! Give the pharmacy 1 day to process the prescription as this is compounded and takes time to make.  Please purchase the following medications over the counter and take as directed: Recti -care  You will be contacted by [facility] Sched[facility] in the next 2 days to arrange a Abdomen Ultrasound.  The number on your caller ID will be [phone], please answer when they call.  If you have not heard from them in 2 days please call [phone] to schedule.    Follow-up in 3 months. Sooner if needed.   Due to recent changes in healthcare laws, you may see the results of your imaging and laboratory studies on MyChart before your provider has had a chance to review them.  We understand that in some cases there may be results that are confusing or concerning to you. Not all laboratory results come back in the same time frame and the provider may be waiting for multiple results in order to interpret others.  Please give us  48 hours in order for your provider to thoroughly review all the results before contacting the office for clarification of your results.   _______________________________________________________  If your blood pressure at your visit was 140/90 or greater, please contact your primary care physician to follow up on this.  _______________________________________________________  If you are age 50 or older, your body mass index should be  between 23-30. Your Body mass index is 35.78 kg/m. If this is out of the aforementioned range listed, please consider follow up with your Primary Care Provider.  If you are age 50 or younger, your body mass index should be between 19-25. Your Body mass index is 35.78 kg/m. If this is out of the aformentioned range listed, please consider follow up with your Primary Care Provider.   ________________________________________________________  The [facility] providers would like to encourage you to use MYCHART to communicate with providers for non-urgent requests or questions.  Due to long hold times on the telephone, sending your provider a message by [facility] may be a faster and more efficient way to get a response.  Please allow 48 business hours for a response.  Please remember that this is for non-urgent requests.  _______________________________________________________  [facility] is using a team-based approach to care.  Your team is made up of your doctor and two to three APPS. Our APPS (Nurse Practitioners and Physician Assistants) work with your physician to ensure care continuity for you. They are fully qualified to address your health concerns and develop a treatment plan. They communicate directly with your gastroenterologist to care for you. Seeing the Advanced Practice Practitioners on your physician's team can help you by facilitating care more promptly, often allowing for earlier appointments, access to diagnostic testing, procedures, and other specialty referrals.   Thank you for choosing me and [facility].  Delon Failing, PA-C    How to Take a [employer]  A sitz bath is a warm water  bath that may be used to care for your rectum, genital area, or the area between your rectum and genitals (perineum). In a sitz bath, the water  only comes up to your hips and covers your buttocks. A sitz bath may be done in a bathtub or with a portable sitz bath that fits over  the toilet. Your health care provider may recommend a sitz bath to help: Relieve pain and discomfort after delivering a baby. Relieve pain and itching from hemorrhoids or anal fissures. Relieve pain after certain surgeries. Relax muscles that are sore or tight. How to take a sitz bath Take 2-4 sitz baths a day, or as many as told by your health care provider. Bathtub sitz bath To take a sitz bath in a bathtub: Partially fill a bathtub with warm water . The water  should be deep enough to cover your hips and buttocks when you are sitting in the bathtub. Follow your health care provider's instructions if you are told to put medicine in the water . Sit in the water . Open the bathtub drain a little, and leave it open during your bath. Turn on the warm water  again, enough to replace the water  that is draining out. Keep the water  running throughout your bath. This helps keep the water  at the right level and temperature. Soak in the water  for 15-20 minutes, or as long as told by your health care provider. When you are done, be careful when you stand up. You may feel dizzy. After the sitz bath, pat yourself dry. Do not rub your skin to dry it.  Over-the-toilet sitz bath To take a sitz bath with an over-the-toilet basin: Follow the manufacturer's instructions. Fill the basin with warm water . Follow your health care provider's instructions if you were told to put medicine in the water . Sit on the seat. Make sure the water  covers your buttocks and perineum. Soak in the water  for 15-20 minutes, or as long as told by your health care provider. After the sitz bath, pat yourself dry. Do not rub your skin to dry it. Clean and dry the basin between uses. Discard the basin if it cracks, or according to the manufacturer's instructions.  Contact a health care provider if: Your pain or itching gets worse. Stop doing sitz baths if your symptoms get worse. You have new symptoms. Stop doing sitz baths until you  talk with your health care provider. Summary A sitz bath is a warm water  bath in which the water  only comes up to your hips and covers your buttocks. Your health care provider may recommend a sitz bath to help relieve pain and discomfort after delivering a baby, relieve pain and itching from hemorrhoids or anal fissures, relieve pain after certain surgeries, or help to relax muscles that are sore or tight. Take 2-4 sitz baths a day, or as many as told by your health care provider. Soak in the water  for 15-20 minutes. Stop doing sitz baths if your symptoms get worse. This information is not intended to replace advice given to you by your health care provider. Make sure you discuss any questions you have with your health care provider. Document Revised: 05/07/2021 Document Reviewed: 05/07/2021 Elsevier Patient Education  2024 [employer].

## 2024-02-26 LAB — CBC WITH DIFFERENTIAL/PLATELET
Basophils Absolute: 0 x10E3/uL (ref 0.0–0.2)
Basos: 0 %
EOS (ABSOLUTE): 0.2 x10E3/uL (ref 0.0–0.4)
Eos: 3 %
Hematocrit: 38.6 % (ref 34.0–46.6)
Hemoglobin: 12.6 g/dL (ref 11.1–15.9)
Immature Grans (Abs): 0 x10E3/uL (ref 0.0–0.1)
Immature Granulocytes: 0 %
Lymphocytes Absolute: 2.9 x10E3/uL (ref 0.7–3.1)
Lymphs: 54 %
MCH: 32.6 pg (ref 26.6–33.0)
MCHC: 32.6 g/dL (ref 31.5–35.7)
MCV: 100 fL — ABNORMAL HIGH (ref 79–97)
Monocytes Absolute: 0.4 x10E3/uL (ref 0.1–0.9)
Monocytes: 7 %
Neutrophils Absolute: 1.9 x10E3/uL (ref 1.4–7.0)
Neutrophils: 36 %
Platelets: 364 x10E3/uL (ref 150–450)
RBC: 3.87 x10E6/uL (ref 3.77–5.28)
RDW: 11.8 % (ref 11.7–15.4)
WBC: 5.4 x10E3/uL (ref 3.4–10.8)

## 2024-02-26 LAB — T3: T3, Total: 172 ng/dL (ref 71–180)

## 2024-02-26 LAB — INSULIN, RANDOM: INSULIN: 23.5 u[IU]/mL (ref 2.6–24.9)

## 2024-02-26 LAB — FOLATE: Folate: 5.6 ng/mL

## 2024-02-26 LAB — TSH: TSH: 0.947 u[IU]/mL (ref 0.450–4.500)

## 2024-02-26 LAB — T4, FREE: Free T4: 1.13 ng/dL (ref 0.82–1.77)

## 2024-02-26 LAB — VITAMIN B12: Vitamin B-12: 630 pg/mL (ref 232–1245)

## 2024-02-26 NOTE — Progress Notes (Signed)
 ____________________________________________________________  Attending physician addendum:  Thank you for sending this case to me. I have reviewed the entire note and agree with the plan.   Victory Brand, MD  ____________________________________________________________

## 2024-02-28 LAB — HEPATITIS B CORE ANTIBODY, TOTAL: Hep B Core Total Ab: NONREACTIVE

## 2024-02-28 LAB — TISSUE TRANSGLUTAMINASE, IGA: (tTG) Ab, IgA: <1 U/mL

## 2024-02-28 LAB — HEPATITIS B SURFACE ANTIBODY,QUALITATIVE: Hep B S Ab: NONREACTIVE

## 2024-02-28 LAB — MITOCHONDRIAL ANTIBODIES: Mitochondrial M2 Ab, IgG: <=20 U

## 2024-02-28 LAB — HEPATITIS A ANTIBODY, TOTAL: Hepatitis A AB,Total: REACTIVE — AB

## 2024-02-28 LAB — ANTI-SMOOTH MUSCLE ANTIBODY, IGG: Actin (Smooth Muscle) Antibody (IGG): <20 U

## 2024-02-28 LAB — HEPATITIS B SURFACE ANTIGEN: Hepatitis B Surface Ag: NONREACTIVE

## 2024-02-28 LAB — ANA: Anti Nuclear Antibody (ANA): NEGATIVE

## 2024-02-28 LAB — ALPHA-1-ANTITRYPSIN: A-1 Antitrypsin, Ser: 160 mg/dL (ref 83–199)

## 2024-02-28 LAB — IGA: Immunoglobulin A: 237 mg/dL (ref 47–310)

## 2024-02-28 LAB — CERULOPLASMIN: Ceruloplasmin: 27 mg/dL (ref 14–48)

## 2024-02-29 ENCOUNTER — Ambulatory Visit: Payer: Self-pay | Admitting: Physician Assistant

## 2024-02-29 NOTE — Progress Notes (Signed)
 Your liver enzymes are actually normal now.  All other liver workup is negative.  Most likely the occasional increase is due to your fatty liver.  I think your PCP can manage this going forward.  No need for further workup by us .  Sincerely, Delon Failing, PA-C

## 2024-03-01 NOTE — Telephone Encounter (Signed)
 Call from pt regarding results. Pt stated she has a few questions. Please advise. Thank you.

## 2024-03-02 ENCOUNTER — Ambulatory Visit: Admitting: Orthopaedic Surgery

## 2024-03-07 ENCOUNTER — Ambulatory Visit (INDEPENDENT_AMBULATORY_CARE_PROVIDER_SITE_OTHER): Admitting: Family Medicine

## 2024-03-07 VITALS — BP 106/74 | HR 68 | Temp 98.4°F | Ht 65.0 in | Wt 206.0 lb

## 2024-03-07 DIAGNOSIS — E1169 Type 2 diabetes mellitus with other specified complication: Secondary | ICD-10-CM

## 2024-03-07 DIAGNOSIS — K760 Fatty (change of) liver, not elsewhere classified: Secondary | ICD-10-CM | POA: Diagnosis not present

## 2024-03-07 DIAGNOSIS — Z6834 Body mass index (BMI) 34.0-34.9, adult: Secondary | ICD-10-CM

## 2024-03-07 DIAGNOSIS — E785 Hyperlipidemia, unspecified: Secondary | ICD-10-CM | POA: Diagnosis not present

## 2024-03-07 DIAGNOSIS — E1165 Type 2 diabetes mellitus with hyperglycemia: Secondary | ICD-10-CM

## 2024-03-07 DIAGNOSIS — Z7985 Long-term (current) use of injectable non-insulin antidiabetic drugs: Secondary | ICD-10-CM | POA: Diagnosis not present

## 2024-03-07 MED ORDER — OZEMPIC (0.25 OR 0.5 MG/DOSE) 2 MG/3ML ~~LOC~~ SOPN: 0.5000 mg | PEN_INJECTOR | SUBCUTANEOUS | 0 refills | Status: DC

## 2024-03-07 NOTE — Progress Notes (Signed)
 "  SUBJECTIVE:  Chief Complaint: Obesity  Interim History: Patient felt the first few weeks were not that difficult.  She did start to have sweets cravings and she did have 1/2 cup of ice cream.  Mentions that the ice cream did improve her cravings.  She could not tolerate hard boiled eggs- used yogurt instead.  She did go get the bread and low sodium turkey breasts.  She is about to go get more of the turkey breast from the deli.  She was able to eat all the protein in at supper.  She loves vinegar, pepper, cucumber and tomato salad with the meat. She did buy spam and bought coconut spray that is calorie free.  Not anticipating any travel or activities in the upcoming few weeks that would sabotage her from following the plan.   Marisa [name] is here to discuss her progress with her obesity treatment plan. She is on the Category 2 Plan and states she is following her eating plan approximately 75 % of the time. She states she is not exercising.   OBJECTIVE: Visit Diagnoses: Problem List Items Addressed This Visit       Endocrine   Hyperlipidemia associated with type 2 diabetes mellitus (HCC)   Relevant Medications   Semaglutide ,0.25 or 0.5MG /DOS, (OZEMPIC , 0.25 OR 0.5 MG/DOSE,) 2 MG/3ML SOPN     Other   Morbid obesity (HCC)   Relevant Medications   Semaglutide ,0.25 or 0.5MG /DOS, (OZEMPIC , 0.25 OR 0.5 MG/DOSE,) 2 MG/3ML SOPN   Other Visit Diagnoses       Type 2 diabetes mellitus with hyperglycemia, without long-term current use of insulin  (HCC)    -  Primary   Relevant Medications   Semaglutide ,0.25 or 0.5MG /DOS, (OZEMPIC , 0.25 OR 0.5 MG/DOSE,) 2 MG/3ML SOPN     NAFLD (nonalcoholic fatty liver disease)         BMI 34.0-34.9,adult           Vitals Temp: 98.4 F (36.9 C) BP: 106/74 Pulse Rate: 68 SpO2: 99 %   Anthropometric Measurements Height: 5' 5 (1.651 m) Weight: 206 lb (93.4 kg) BMI (Calculated): 34.28 Weight at Last Visit: 209 lb Weight Lost Since Last Visit: 3 Weight  Gained Since Last Visit: 0 Starting Weight: 209 lb Total Weight Loss (lbs): 3 lb (1.361 kg)   Body Composition  Body Fat %: 44.9 % Fat Mass (lbs): 92.6 lbs Muscle Mass (lbs): 107.8 lbs Total Body Water  (lbs): 73.8 lbs Visceral Fat Rating : 11   Other Clinical Data Today's Visit #: 2 Starting Date: 02/22/24 Comments: Cat 2     ASSESSMENT AND PLAN: Assessment & Plan Type 2 diabetes mellitus with hyperglycemia, without long-term current use of insulin  (HCC) Tolerating prescription of Ozempic  0.25mg  but no GI side effects.  Still having food desires/ cravings.  Will increase Ozempic  to 0.5mg  weekly- new Rx sent in.  Hyperlipidemia associated with type 2 diabetes mellitus (HCC) Prior fasting lipid panel done in October 2025 showing significantly elevated LDL at 138, triglycerides elevated at 155, and HDL below goal at 40.  Patient is on Lipitor 40 mg daily and denies any myalgias.  Will continue current treatment at this time with reevaluation as to whether or not dosage need can be changed at next laboratory evaluation. NAFLD (nonalcoholic fatty liver disease) LFTs elevated on most recent laboratory data.  However this is improved from 1 month prior where LFTs were slightly higher.  Most common cause of liver enzyme elevation is hepatic steatosis.  Patient is working on dietary  changes and is currently taking Ozempic  which will additionally help with possible fat infiltration. BMI 34.0-34.9,adult  Morbid obesity (HCC)    Diet: Garrett is currently in the action stage of change. As such, her goal is to continue with weight loss efforts and has agreed to the Category 2 Plan.   Exercise:  All adults should avoid inactivity. Some activity is better than none, and adults who participate in any amount of physical activity, gain some health benefits.  Behavior Modification:  We discussed the following Behavioral Modification Strategies today: increasing lean protein intake,  decreasing simple carbohydrates, increasing vegetables, meal planning and cooking strategies, keeping healthy foods in the home, and avoiding temptations.   Return in about 3 weeks (around 03/28/2024).   She was informed of the importance of frequent follow up visits to maximize her success with intensive lifestyle modifications for her multiple health conditions.  Attestation Statements:   Reviewed by clinician on day of visit: allergies, medications, problem list, medical history, surgical history, family history, social history, and previous encounter notes.     Marisa Cho, MD "

## 2024-03-08 ENCOUNTER — Other Ambulatory Visit

## 2024-03-08 ENCOUNTER — Ambulatory Visit: Admitting: Physician Assistant

## 2024-03-08 ENCOUNTER — Encounter: Admitting: Family

## 2024-03-08 DIAGNOSIS — M25571 Pain in right ankle and joints of right foot: Secondary | ICD-10-CM

## 2024-03-08 MED ORDER — TRAMADOL HCL 50 MG PO TABS: 50.0000 mg | ORAL_TABLET | Freq: Two times a day (BID) | ORAL | 0 refills | Status: AC | PRN

## 2024-03-08 NOTE — Progress Notes (Signed)
 "  Office Visit Note   Patient: Marisa [name]           Date of Birth: 03-01-1975           MRN: [id number] Visit Date: 03/08/2024              Requested by: Jule Ronal CROME, PA-C [address]  [place],  [place] [address] PCP: Jaycee Greig PARAS, NP   Assessment & Plan: Visit Diagnoses:  1. Pain in right ankle and joints of right foot     Plan: Impression is right ankle sprain although there is evidence of a subacute avulsion fracture to the medial malleolus.  I do not feel her symptoms are related to the medial malleolus fracture, however.  We have discussed placing her in a cam boot where she will transition to an ASO brace once she is able to tolerate this.  She will ice and elevate as needed for pain and swelling.  I sent in tramadol  to take as needed.  Follow-up as needed.  Follow-Up Instructions: Return if symptoms worsen or fail to improve.   Orders:  Orders Placed This Encounter  Procedures   XR Ankle Complete Right   Meds ordered this encounter  Medications   traMADol  (ULTRAM ) 50 MG tablet    Sig: Take 1 tablet (50 mg total) by mouth every 12 (twelve) hours as needed.    Dispense:  30 tablet    Refill:  0      Procedures: No procedures performed   Clinical Data: No additional findings.   Subjective: Chief Complaint  Patient presents with   Right Ankle - Pain    HPI patient is a pleasant 49 year old female who comes in today with right ankle pain.  She tells me she had an inversion injury to the right ankle about a month ago.  She has had pain and swelling since.  All of her pain is to the lateral ankle.  Symptoms are worse when she is walking or when she is standing for too long.  She has been taking ibuprofen  without significant relief.  Review of Systems as detailed in HPI.  All others reviewed and are negative.   Objective: Vital Signs: LMP 11/27/2012   Physical Exam well-developed well-nourished female in no acute distress.  Alert and oriented x  3.  Ortho Exam right ankle exam: She does have mild swelling to the lateral ankle.  She has mild to moderate tenderness over the lateral malleolus.  Tenderness to palpation around the peroneal tendon.  Pain with dorsiflexion and inversion of the ankle.  She is neurovascularly intact distally.   Specialty Comments:  No specialty comments available.  Imaging: XR Ankle Complete Right Result Date: 03/08/2024 X-rays demonstrate a small avulsion to the medial malleolus.    PMFS History: Patient Active Problem List   Diagnosis Date Noted   Genetic testing 01/25/2024   Cervical cord compression with myelopathy (HCC) 10/09/2023   Positive blood culture 12/13/2022   Bacteremia 09/16/2022   Nausea and vomiting 09/16/2022   Vaginal itching 09/15/2022   Lactic acidosis 09/15/2022   GI bleed 09/14/2022   Colitis 09/14/2022   Elevated liver enzymes 09/14/2022   Elevated MCV 09/14/2022   Hypokalemia 09/14/2022   Multinodular thyroid  03/01/2021   Solitary pulmonary nodule 02/21/2021   Frequent falls 02/21/2021   Weakness of both lower extremities 02/21/2021   Carpal tunnel syndrome of right wrist 02/21/2021   Acute constipation 02/21/2021   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  Morbid obesity (HCC) 12/24/2020   Allergic rhinitis 06/26/2020   Hyperlipidemia associated with type 2 diabetes mellitus (HCC) 04/10/2020   Chronic pain syndrome 04/09/2020   Type 2 diabetes mellitus with diabetic neuropathy, without long-term current use of insulin  (HCC) 04/09/2020   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 12/13/2019   Vitamin D  deficiency 04/05/2019   Class 2 severe obesity with serious comorbidity and body mass index (BMI) of 36.0 to 36.9 in adult 04/05/2019   Obesity (BMI 30-39.9) 03/05/2018   Chronic diarrhea    Benign neoplasm of transverse colon    Benign neoplasm of sigmoid colon    Neuropathic pain of both legs 06/04/2016   Rash and nonspecific skin  eruption 07/12/2015   Dandruff 03/26/2015   Migraine variant with headache 05/09/2014   GAD (generalized anxiety disorder) 05/04/2014   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   Social anxiety disorder 05/04/2014   PTSD (post-traumatic stress disorder) 05/04/2014   Depression 03/27/2014   OSA (obstructive sleep apnea) 03/06/2014   Insomnia 03/06/2014   History of cardiac arrest    Chest pain 02/20/2014   Rectal bleeding 07/25/2013   S/P Total vaginal hysterectomy on 01/06/13 01/06/2013   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   Past Medical History:  Diagnosis Date   Abdominal pain 04/26/2013   Abnormal uterine bleeding (AUB) 10/11/2012   Acute bronchitis    Allergy    Anal pain    chronic   Anemia    Anxiety    Asthma    exacerbation 02-28-2014 and 02-23-2014 secondary to Rhinovirus   Atypical chest pain 04/26/2013   Back pain    Benign neoplasm of sigmoid colon    Benign neoplasm of transverse colon    Carbuncle of labium 07/12/2015   Chest pain 02/20/2014   Chronic diarrhea    Chronic headaches    Chronic low back pain    Cigarette nicotine dependence without complication 05/04/2014   Cyst of right ovary    Dandruff 03/26/2015   Diabetes mellitus without complication (HCC) 10/13/2013   Diabetic neuropathy, painful (HCC) 12/13/2019   Difficult intravenous access    PER PT NEEDS PICC LINE   Dyspnea 09/07/2012   Spiro 08/2012:  No obstruction by FEV1%, but probable restriction.     Falls 03/27/2014   Fatty liver    Food allergy    Mushrooms, shellfish   GAD (generalized anxiety disorder) 05/04/2014   Gait disturbance 04/11/2014   Gait instability    GERD (gastroesophageal reflux disease)    History of adenomatous polyp of colon    History of cardiac arrest    during SVD 1992   History of ectopic pregnancy    2009-  S/P LEFT SALPINGECTOMY   History of panic attacks    Hyperlipidemia    IBS (irritable bowel syndrome)    Insomnia 03/06/2014   Joint pain    Lower  extremity edema    Lumbar stenosis L4 -- L5 with bulging disk   w/ right leg weakness/ decreased mobility   Migraine variant with headache 05/09/2014   Mild obstructive sleep apnea    study 03-20-2014  no cpap recommended   Nausea and vomiting 09/16/2022   Neuromuscular disorder (HCC)    neuropathy in feet - no current problems as of 10/06/23 per patient   Neuropathic pain of both legs 06/04/2016   Obesity (BMI 30-39.9) 03/05/2018   OSA (obstructive sleep apnea) 03/06/2014   does not use CPAP - mild OSA   Panic disorder with agoraphobia  05/04/2014   Panniculitis 03/05/2018   Pelvic pain 07/25/2013   Persistent vomiting 04/27/2013   Pneumonia    x 1   PTSD (post-traumatic stress disorder) 05/04/2014   Rash and nonspecific skin eruption 07/12/2015   Rectal bleeding 07/25/2013   hx   RLQ abdominal pain    S/P Total vaginal hysterectomy on 01/06/13 01/06/2013   Sleep apnea    mild no cpap   SOB (shortness of breath)    Social anxiety disorder 05/04/2014   Sore throat 02/20/2014   Stomach ulcer    hx   Tachycardia 02/20/2014   Type 2 diabetes mellitus (HCC)    Vitamin D  deficiency    Weakness of right leg    FROM BACK PROBLEM PER PT - no current problem per patient on 10/06/23    Family History  Problem Relation Age of Onset   Cancer Mother    Hypertension Mother    Diabetes Mother    Allergies Mother    Heart disease Mother    Clotting disorder Mother    Stroke Mother    Kidney disease Mother    Thyroid  disease Mother    Cervical cancer Mother    Cancer Father    Stroke Father    Pancreatic cancer Father 67   Hyperlipidemia Father    Hypertension Father    Liver disease Father    Schizophrenia Sister    Bipolar disorder Sister    Clotting disorder Sister    Brain cancer Sister    Cervical cancer Sister    Bipolar disorder Sister    Bipolar disorder Brother    Kidney disease Brother    Drug abuse Brother    Drug abuse Brother    Liver cancer Maternal  Grandmother    Heart disease Maternal Grandmother    Colon cancer Paternal Grandfather 47   Colon cancer Paternal Uncle 16   Liver cancer Other    Colon polyps Neg Hx    Esophageal cancer Neg Hx    Stomach cancer Neg Hx     Past Surgical History:  Procedure Laterality Date   ANTERIOR CERVICAL DECOMP/DISCECTOMY FUSION N/A 10/09/2023   Procedure: ANTERIOR CERVICAL DECOMPRESSION/DISCECTOMY FUSION 1 LEVEL;  Surgeon: Gillie Duncans, MD;  Location: [facility];  Service: Neurosurgery;  Laterality: N/A;  ACDF - C4-C5   BIOPSY  01/22/2023   Procedure: BIOPSY;  Surgeon: Legrand Victory LITTIE DOUGLAS, MD;  Location: [facility];  Service: Gastroenterology;;   COLONOSCOPY Left 04/29/2013   Procedure: COLONOSCOPY;  Surgeon: Elsie Cree, MD;  [facility]: [facility];  Service: Endoscopy;  Laterality: Left;   COLONOSCOPY WITH PROPOFOL  N/A 08/01/2016   Procedure: COLONOSCOPY WITH PROPOFOL ;  Surgeon: Legrand Victory LITTIE DOUGLAS, MD;  [facility];  Service: Gastroenterology;  Laterality: N/A;   COLONOSCOPY WITH PROPOFOL  N/A 01/22/2023   Procedure: COLONOSCOPY WITH PROPOFOL ;  Surgeon: Legrand Victory LITTIE DOUGLAS, MD;  [facility];  Service: Gastroenterology;  Laterality: N/A;   ESOPHAGOGASTRODUODENOSCOPY (EGD) WITH PROPOFOL  N/A 11/10/2018   Procedure: ESOPHAGOGASTRODUODENOSCOPY (EGD) WITH PROPOFOL ;  Surgeon: Legrand Victory LITTIE DOUGLAS, MD;  [facility];  Service: Gastroenterology;  Laterality: N/A;   EVALUATION UNDER ANESTHESIA WITH FISTULECTOMY N/A 04/20/2014   Procedure: EXAM UNDER ANESTHESIA ;  Surgeon: Bernarda Ned, MD;  [facility];  Service: General;  Laterality: N/A;   FLEXIBLE SIGMOIDOSCOPY N/A 11/09/2013   Procedure: FLEXIBLE SIGMOIDOSCOPY;  Surgeon: Elsie Cree, MD;  [facility];  Service: Endoscopy;  Laterality: N/A;   fupa removal  LAPAROSCOPIC CHOLECYSTECTOMY  2005   LAPAROSCOPIC GASTRIC SLEEVE RESECTION N/A 12/24/2020   Procedure: LAPAROSCOPIC GASTRIC  SLEEVE RESECTION;  Surgeon: Signe Mitzie LABOR, MD;  [facility];  Service: General;  Laterality: N/A;   REFRACTIVE SURGERY     SPHINCTEROTOMY N/A 04/20/2014   Procedure:  LATERAL INTERNAL SPHINCTEROTOMY;  Surgeon: Bernarda Ned, MD;  Location: [facility];  Service: General;  Laterality: N/A;   TRANSTHORACIC ECHOCARDIOGRAM  12/30/2012   mild LVH/  ef 55-60%   UNILATERAL SALPINGECTOMY  2009   laparotomy left salpingectomy-- ectopic preg.   UPPER GASTROINTESTINAL ENDOSCOPY     UPPER GI ENDOSCOPY N/A 12/24/2020   Procedure: UPPER GI ENDOSCOPY;  Surgeon: Signe Mitzie LABOR, MD;  Location[facility];  Service: General;  Laterality: N/A;   VAGINAL HYSTERECTOMY N/A 01/06/2013   Procedure: HYSTERECTOMY VAGINAL;  Surgeon: Gloris LABOR Hugger, MD;  Location: [facility];  Service: Gynecology;  Laterality: N/A;   Social History   Occupational History   Occupation: disability  Tobacco Use   Smoking status: Former    Current packs/day: 0.00    Average packs/day: 0.2 packs/day for 11.0 years (2.2 ttl pk-yrs)    Types: Cigarettes    Start date: 11/18/2002    Quit date: 11/17/2013    Years since quitting: 10.3   Smokeless tobacco: Never   Tobacco comments:    2 years quit  Vaping Use   Vaping status: Never Used  Substance and Sexual Activity   Alcohol use: Not Currently    Comment: 1 bottle wine per day x3 years, quit 09/2023   Drug use: No   Sexual activity: Yes    Birth control/protection: Surgical    Comment: Hysterectomy        "

## 2024-03-08 NOTE — Progress Notes (Signed)
 Erroneous encounter-disregard

## 2024-03-09 ENCOUNTER — Ambulatory Visit: Admitting: Family

## 2024-03-11 ENCOUNTER — Ambulatory Visit: Admitting: Family

## 2024-03-11 ENCOUNTER — Ambulatory Visit (HOSPITAL_COMMUNITY)
Admission: RE | Admit: 2024-03-11 | Discharge: 2024-03-11 | Disposition: A | Source: Ambulatory Visit | Attending: Physician Assistant

## 2024-03-11 DIAGNOSIS — R748 Abnormal levels of other serum enzymes: Secondary | ICD-10-CM | POA: Diagnosis present

## 2024-03-11 DIAGNOSIS — K602 Anal fissure, unspecified: Secondary | ICD-10-CM | POA: Diagnosis present

## 2024-03-11 DIAGNOSIS — K625 Hemorrhage of anus and rectum: Secondary | ICD-10-CM | POA: Insufficient documentation

## 2024-03-13 NOTE — Assessment & Plan Note (Signed)
 Prior fasting lipid panel done in October 2025 showing significantly elevated LDL at 138, triglycerides elevated at 155, and HDL below goal at 40.  Patient is on Lipitor 40 mg daily and denies any myalgias.  Will continue current treatment at this time with reevaluation as to whether or not dosage need can be changed at next laboratory evaluation.

## 2024-03-15 ENCOUNTER — Other Ambulatory Visit: Payer: Self-pay | Admitting: Family

## 2024-03-15 DIAGNOSIS — F32A Depression, unspecified: Secondary | ICD-10-CM

## 2024-03-15 DIAGNOSIS — E559 Vitamin D deficiency, unspecified: Secondary | ICD-10-CM

## 2024-03-15 NOTE — Telephone Encounter (Unsigned)
 Copied from CRM [id number]. Topic: Clinical - Medication Refill >> Mar 15, 2024  9:58 AM Macario HERO wrote: Medication: Vitamin D , Ergocalciferol , (DRISDOL ) 1.25 MG (50000 UNIT) CAPS capsule [[id number]] Phentermine  15 MG buPROPion  (WELLBUTRIN  XL) 150 MG 24 hr tablet [[id number]]   Has the patient contacted their pharmacy? No (Agent: If no, request that the patient contact the pharmacy for the refill. If patient does not wish to contact the pharmacy document the reason why and proceed with request.) (Agent: If yes, when and what did the pharmacy advise?)  This is the patient's preferred pharmacy:   [facility] [address], [place] - [address] [place] [address] Phone: [phone] Fax: [phone]  Is this the correct pharmacy for this prescription? Yes If no, delete pharmacy and type the correct one.   Has the prescription been filled recently? Yes  Is the patient out of the medication? Yes  Has the patient been seen for an appointment in the last year OR does the patient have an upcoming appointment? Yes  Can we respond through MyChart? Yes  Agent: Please be advised that Rx refills may take up to 3 business days. We ask that you follow-up with your pharmacy.

## 2024-03-16 MED ORDER — BUPROPION HCL ER (XL) 150 MG PO TB24: 150.0000 mg | ORAL_TABLET | Freq: Every day | ORAL | 0 refills | Status: AC

## 2024-03-16 NOTE — Telephone Encounter (Signed)
 Requested medication (s) are due for refill today: yes  Requested medication (s) are on the active medication list: yes  Last refill:  12/09/23  Future visit scheduled: yes  Notes to clinic:  Manual Review: Route requests for 50,000 IU strength to the provider      Requested Prescriptions  Pending Prescriptions Disp Refills   Vitamin D , Ergocalciferol , (DRISDOL ) 1.25 MG (50000 UNIT) CAPS capsule 12 capsule 0    Sig: Take 1 capsule (50,000 Units total) by mouth every 7 (seven) days.     Endocrinology:  Vitamins - Vitamin D  Supplementation 2 Failed - 03/16/2024  9:26 AM      Failed - Manual Review: Route requests for 50,000 IU strength to the provider      Failed - Vitamin D  in normal range and within 360 days    Vit D, 25-Hydroxy  Date Value Ref Range Status  12/08/2023 18.8 (L) 30.0 - 100.0 ng/mL Final    Comment:    Vitamin D  deficiency has been defined by the Institute of Medicine and an Endocrine Society practice guideline as a level of serum 25-OH vitamin D  less than 20 ng/mL (1,2). The Endocrine Society went on to further define vitamin D  insufficiency as a level between 21 and 29 ng/mL (2). 1. IOM (Institute of Medicine). 2010. Dietary reference    intakes for calcium  and D. [place]: The    [employer]. 2. Holick MF, Binkley [place], Bischoff-Ferrari HA, et al.    Evaluation, treatment, and prevention of vitamin D     deficiency: an Endocrine Society clinical practice    guideline. JCEM. 2011 Jul; 96(7):1911-30.          Passed - Ca in normal range and within 360 days    Calcium   Date Value Ref Range Status  02/25/2024 9.5 8.4 - 10.5 mg/dL Final   Calcium , Ion  Date Value Ref Range Status  06/30/2023 1.12 (L) 1.15 - 1.40 mmol/L Final  06/30/2023 1.11 (L) 1.15 - 1.40 mmol/L Final         Passed - Valid encounter within last 12 months    Recent Outpatient Visits           2 months ago Type 2 diabetes mellitus with hyperglycemia, without  long-term current use of insulin  [facility])   [place] [facility], [place] J, NP   3 months ago Hyperlipidemia, unspecified hyperlipidemia type   [facility] at [facility], [place], NP   6 months ago Annual physical exam   [facility], Amy J, NP   7 months ago Encounter to establish care   [facility] at [facility], Amy J, NP   10 months ago Cervical radiculopathy   [facility] Theotis Haze ORN, NP              Signed Prescriptions Disp Refills   buPROPion  (WELLBUTRIN  XL) 150 MG 24 hr tablet 90 tablet 0    Sig: Take 1 tablet (150 mg total) by mouth daily.     Psychiatry: Antidepressants - bupropion  Passed - 03/16/2024  9:26 AM      Passed - Cr in normal range and within 360 days    Creat  Date Value Ref Range Status  09/29/2013 0.60 0.50 - 1.10 mg/dL Final   Creatinine, Ser  Date Value Ref Range Status  02/25/2024 0.73 0.40 - 1.20 mg/dL Final   Creatinine, Urine  Date Value Ref Range Status  04/16/2016 CANCELED 20 - 320 mg/dL     Comment:    Test not performed, no urine was received.    Result canceled by the ancillary          Passed - AST in normal range and within 360 days    AST  Date Value Ref Range Status  02/25/2024 21 5 - 37 U/L Final         Passed - ALT in normal range and within 360 days    ALT  Date Value Ref Range Status  02/25/2024 31 3 - 35 U/L Final         Passed - Completed PHQ-2 or PHQ-9 in the last 360 days      Passed - Last BP in normal range    BP Readings from Last 1 Encounters:  03/07/24 106/74         Passed - Valid encounter within last 6 months    Recent Outpatient Visits           2 months ago Type 2 diabetes mellitus with hyperglycemia, without long-term current use of insulin  [facility])   [place] [facility], [place] J, NP   3  months ago Hyperlipidemia, unspecified hyperlipidemia type   [facility] at [facility], [place], NP   6 months ago Annual physical exam   [facility] at [facility], Amy J, NP   7 months ago Encounter to establish care   [facility] at [facility], Amy J, NP   10 months ago Cervical radiculopathy   [facility] [place]. [facility] Theotis Haze ORN, [place]

## 2024-03-16 NOTE — Telephone Encounter (Signed)
 Requested Prescriptions  Pending Prescriptions Disp Refills   Vitamin D , Ergocalciferol , (DRISDOL ) 1.25 MG (50000 UNIT) CAPS capsule 12 capsule 0    Sig: Take 1 capsule (50,000 Units total) by mouth every 7 (seven) days.     Endocrinology:  Vitamins - Vitamin D  Supplementation 2 Failed - 03/16/2024  9:25 AM      Failed - Manual Review: Route requests for 50,000 IU strength to the provider      Failed - Vitamin D  in normal range and within 360 days    Vit D, 25-Hydroxy  Date Value Ref Range Status  12/08/2023 18.8 (L) 30.0 - 100.0 ng/mL Final    Comment:    Vitamin D  deficiency has been defined by the Institute of Medicine and an Endocrine Society practice guideline as a level of serum 25-OH vitamin D  less than 20 ng/mL (1,2). The Endocrine Society went on to further define vitamin D  insufficiency as a level between 21 and 29 ng/mL (2). 1. IOM (Institute of Medicine). 2010. Dietary reference    intakes for calcium  and D. [place]: The    [employer]. 2. Holick MF, Binkley [place], Bischoff-Ferrari HA, et al.    Evaluation, treatment, and prevention of vitamin D     deficiency: an Endocrine Society clinical practice    guideline. JCEM. 2011 Jul; 96(7):1911-30.          Passed - Ca in normal range and within 360 days    Calcium   Date Value Ref Range Status  02/25/2024 9.5 8.4 - 10.5 mg/dL Final   Calcium , Ion  Date Value Ref Range Status  06/30/2023 1.12 (L) 1.15 - 1.40 mmol/L Final  06/30/2023 1.11 (L) 1.15 - 1.40 mmol/L Final         Passed - Valid encounter within last 12 months    Recent Outpatient Visits           2 months ago Type 2 diabetes mellitus with hyperglycemia, without long-term current use of insulin  [facility])   [place] [facility], [place] J, NP   3 months ago Hyperlipidemia, unspecified hyperlipidemia type   [facility] at [facility], [place], NP   6 months ago Annual physical exam   [facility]  [facility] at [facility], Amy J, NP   7 months ago Encounter to establish care   [facility] at [facility], Amy J, NP   10 months ago Cervical radiculopathy   [facility], Zelda W, NP               buPROPion  (WELLBUTRIN  XL) 150 MG 24 hr tablet 90 tablet 0    Sig: Take 1 tablet (150 mg total) by mouth daily.     Psychiatry: Antidepressants - bupropion  Passed - 03/16/2024  9:25 AM      Passed - Cr in normal range and within 360 days    Creat  Date Value Ref Range Status  09/29/2013 0.60 0.50 - 1.10 mg/dL Final   Creatinine, Ser  Date Value Ref Range Status  02/25/2024 0.73 0.40 - 1.20 mg/dL Final   Creatinine, Urine  Date Value Ref Range Status  04/16/2016 CANCELED 20 - 320 mg/dL     Comment:    Test not performed, no urine was received.    Result canceled by the ancillary  Passed - AST in normal range and within 360 days    AST  Date Value Ref Range Status  02/25/2024 21 5 - 37 U/L Final         Passed - ALT in normal range and within 360 days    ALT  Date Value Ref Range Status  02/25/2024 31 3 - 35 U/L Final         Passed - Completed PHQ-2 or PHQ-9 in the last 360 days      Passed - Last BP in normal range    BP Readings from Last 1 Encounters:  03/07/24 106/74         Passed - Valid encounter within last 6 months    Recent Outpatient Visits           2 months ago Type 2 diabetes mellitus with hyperglycemia, without long-term current use of insulin  [facility])   [place] [facility] at [facility], [place] J, NP   3 months ago Hyperlipidemia, unspecified hyperlipidemia type   [facility] at [facility], [place], NP   6 months ago Annual physical exam   [facility], Amy J, NP   7 months ago Encounter to establish care   [facility] at [facility], Amy J, NP   10 months ago Cervical radiculopathy   [facility] [place]. [facility] Theotis Haze ORN, [place]

## 2024-03-16 NOTE — Telephone Encounter (Signed)
 Schedule appointment for Vitamin D  lab and prescription.

## 2024-03-17 NOTE — Telephone Encounter (Signed)
 Please advise.

## 2024-03-17 NOTE — Telephone Encounter (Signed)
 Requested medication (s) are due for refill today: yes  Requested medication (s) are on the active medication list: yes  Last refill:  12/09/23  Future visit scheduled: {Yes  Notes to clinic:  Manual Review: Route requests for 50,000 IU strength to the provider      Requested Prescriptions  Pending Prescriptions Disp Refills   Vitamin D , Ergocalciferol , (DRISDOL ) 1.25 MG (50000 UNIT) CAPS capsule 12 capsule 0    Sig: Take 1 capsule (50,000 Units total) by mouth every 7 (seven) days.     Endocrinology:  Vitamins - Vitamin D  Supplementation 2 Failed - 03/17/2024 10:39 AM      Failed - Manual Review: Route requests for 50,000 IU strength to the provider      Failed - Vitamin D  in normal range and within 360 days    Vit D, 25-Hydroxy  Date Value Ref Range Status  12/08/2023 18.8 (L) 30.0 - 100.0 ng/mL Final    Comment:    Vitamin D  deficiency has been defined by the Institute of Medicine and an Endocrine Society practice guideline as a level of serum 25-OH vitamin D  less than 20 ng/mL (1,2). The Endocrine Society went on to further define vitamin D  insufficiency as a level between 21 and 29 ng/mL (2). 1. IOM (Institute of Medicine). 2010. Dietary reference    intakes for calcium  and D. [place]: The    [employer]. 2. Holick MF, Binkley [place], Bischoff-Ferrari HA, et al.    Evaluation, treatment, and prevention of vitamin D     deficiency: an Endocrine Society clinical practice    guideline. JCEM. 2011 Jul; 96(7):1911-30.          Passed - Ca in normal range and within 360 days    Calcium   Date Value Ref Range Status  02/25/2024 9.5 8.4 - 10.5 mg/dL Final   Calcium , Ion  Date Value Ref Range Status  06/30/2023 1.12 (L) 1.15 - 1.40 mmol/L Final  06/30/2023 1.11 (L) 1.15 - 1.40 mmol/L Final         Passed - Valid encounter within last 12 months    Recent Outpatient Visits           2 months ago Type 2 diabetes mellitus with hyperglycemia, without  long-term current use of insulin  [facility])   [facility], Virginia J, NP   3 months ago Hyperlipidemia, unspecified hyperlipidemia type   [facility], [place], NP   6 months ago Annual physical exam   [facility], Amy J, NP   7 months ago Encounter to establish care   [facility], Amy J, NP   10 months ago Cervical radiculopathy   [facility] [place]. [facility] [place], Zelda W, NP               buPROPion  (WELLBUTRIN  XL) 150 MG 24 hr tablet 90 tablet 0    Sig: Take 1 tablet (150 mg total) by mouth daily.     There is no refill protocol information for this order    Signed Prescriptions Disp Refills   buPROPion  (WELLBUTRIN  XL) 150 MG 24 hr tablet 90 tablet 0    Sig: Take 1 tablet (150 mg total) by mouth daily.     Psychiatry: Antidepressants - bupropion  Passed - 03/17/2024 10:39 AM  Passed - Cr in normal range and within 360 days    Creat  Date Value Ref Range Status  09/29/2013 0.60 0.50 - 1.10 mg/dL Final   Creatinine, Ser  Date Value Ref Range Status  02/25/2024 0.73 0.40 - 1.20 mg/dL Final   Creatinine, Urine  Date Value Ref Range Status  04/16/2016 CANCELED 20 - 320 mg/dL     Comment:    Test not performed, no urine was received.    Result canceled by the ancillary          Passed - AST in normal range and within 360 days    AST  Date Value Ref Range Status  02/25/2024 21 5 - 37 U/L Final         Passed - ALT in normal range and within 360 days    ALT  Date Value Ref Range Status  02/25/2024 31 3 - 35 U/L Final         Passed - Completed PHQ-2 or PHQ-9 in the last 360 days      Passed - Last BP in normal range    BP Readings from Last 1 Encounters:  03/07/24 106/74         Passed - Valid encounter within last 6 months    Recent Outpatient Visits            2 months ago Type 2 diabetes mellitus with hyperglycemia, without long-term current use of insulin  [facility])   [facility], [place] J, NP   3 months ago Hyperlipidemia, unspecified hyperlipidemia type   [facility], [place], NP   6 months ago Annual physical exam   [facility], Amy J, NP   7 months ago Encounter to establish care   [facility] at [facility], Amy J, NP   10 months ago Cervical radiculopathy   [facility] [place]. [facility] Theotis H[facility] [place], [place]

## 2024-03-17 NOTE — Telephone Encounter (Unsigned)
 Copied from CRM [id number]. Topic: Clinical - Prescription Issue >> Mar 17, 2024  9:06 AM Wess RAMAN wrote: Reason for CRM: Patient still hasn't received the Phentermine  15 MG and buPROPion  (WELLBUTRIN  XL) 150 MG 24 hr tablet   Callback #: [id number]  Pharmacy: [facility] [address] [place] - [address] [place] [address] Phone: [phone] Fax: [phone] Hours: Not open 24 hours

## 2024-03-18 ENCOUNTER — Other Ambulatory Visit: Payer: Self-pay | Admitting: Family

## 2024-03-18 DIAGNOSIS — R635 Abnormal weight gain: Secondary | ICD-10-CM

## 2024-03-18 NOTE — Telephone Encounter (Signed)
 Schedule appointment for Vitamin D .

## 2024-03-21 NOTE — Telephone Encounter (Signed)
 Pt called in today 02/02 to follow up on this request. Advised pt that would need appt.   Appt shceudled for 04/20/2024 @ 9:40 AM  Pt would like to know if she can be given a hold over of both medications   Phentermine  15 MG   buPROPion  (WELLBUTRIN  XL) 150 MG 24 hr tablet

## 2024-03-22 NOTE — Telephone Encounter (Signed)
 Schedule appointment?

## 2024-03-23 ENCOUNTER — Encounter: Payer: Self-pay | Admitting: Physician Assistant

## 2024-03-23 ENCOUNTER — Ambulatory Visit: Admitting: Physician Assistant

## 2024-03-23 VITALS — BP 114/90 | HR 90 | Ht 65.0 in | Wt 208.0 lb

## 2024-03-23 DIAGNOSIS — G43709 Chronic migraine without aura, not intractable, without status migrainosus: Secondary | ICD-10-CM | POA: Diagnosis not present

## 2024-03-23 DIAGNOSIS — E669 Obesity, unspecified: Secondary | ICD-10-CM

## 2024-03-23 DIAGNOSIS — Z6834 Body mass index (BMI) 34.0-34.9, adult: Secondary | ICD-10-CM

## 2024-03-23 MED ORDER — PHENTERMINE HCL 15 MG PO CAPS: 15.0000 mg | ORAL_CAPSULE | ORAL | 0 refills | Status: AC

## 2024-03-23 MED ORDER — TOPIRAMATE 50 MG PO TABS: 50.0000 mg | ORAL_TABLET | Freq: Every day | ORAL | 1 refills | Status: AC

## 2024-03-23 NOTE — Progress Notes (Signed)
 "  Established Patient Office Visit  Subjective   Patient ID: Marisa [name], female    DOB: [birth year]  Age: 49 y.o. MRN: [id number]  Chief Complaint  Patient presents with   Hypertension   Diabetes    Follow up on chronic health conditions and medication management   Discussed the use of AI scribe software for clinical note transcription with the patient, who gave verbal consent to proceed.  History of Present Illness    Marisa [name] is a 49 year old female with diabetes and migraines who presents for a refill of phentermine  and management of migraines.  She is taking Ozempic  for diabetes and weight control. She last used phentermine  one week ago and requests a refill.  Her migraines are long-standing, occur mainly in the morning, and are triggered by light and overstimulation. She uses tinted glasses for photophobia. She takes Topamax  25 mg at bedtime, which helped initially but is now less effective, with no side effects. She uses sumatriptan  for acute attacks with limited relief and often adds Advil . She takes weekly vitamin D  for previously low levels.     Past Medical History:  Diagnosis Date   Abdominal pain 04/26/2013   Abnormal uterine bleeding (AUB) 10/11/2012   Acute bronchitis    Allergy    Anal pain    chronic   Anemia    Anxiety    Asthma    exacerbation 02-28-2014 and 02-23-2014 secondary to Rhinovirus   Atypical chest pain 04/26/2013   Back pain    Benign neoplasm of sigmoid colon    Benign neoplasm of transverse colon    Carbuncle of labium 07/12/2015   Chest pain 02/20/2014   Chronic diarrhea    Chronic headaches    Chronic low back pain    Cigarette nicotine dependence without complication 05/04/2014   Cyst of right ovary    Dandruff 03/26/2015   Diabetes mellitus without complication (HCC) 10/13/2013   Diabetic neuropathy, painful (HCC) 12/13/2019   Difficult intravenous access    PER PT NEEDS PICC LINE   Dyspnea 09/07/2012    Spiro 08/2012:  No obstruction by FEV1%, but probable restriction.     Falls 03/27/2014   Fatty liver    Food allergy    Mushrooms, shellfish   GAD (generalized anxiety disorder) 05/04/2014   Gait disturbance 04/11/2014   Gait instability    GERD (gastroesophageal reflux disease)    History of adenomatous polyp of colon    History of cardiac arrest    during SVD 1992   History of ectopic pregnancy    2009-  S/P LEFT SALPINGECTOMY   History of panic attacks    Hyperlipidemia    IBS (irritable bowel syndrome)    Insomnia 03/06/2014   Joint pain    Lower extremity edema    Lumbar stenosis L4 -- L5 with bulging disk   w/ right leg weakness/ decreased mobility   Migraine variant with headache 05/09/2014   Mild obstructive sleep apnea    study 03-20-2014  no cpap recommended   Nausea and vomiting 09/16/2022   Neuromuscular disorder (HCC)    neuropathy in feet - no current problems as of 10/06/23 per patient   Neuropathic pain of both legs 06/04/2016   Obesity (BMI 30-39.9) 03/05/2018   OSA (obstructive sleep apnea) 03/06/2014   does not use CPAP - mild OSA   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   Panniculitis 03/05/2018   Pelvic pain 07/25/2013   Persistent vomiting 04/27/2013  Pneumonia    x 1   PTSD (post-traumatic stress disorder) 05/04/2014   Rash and nonspecific skin eruption 07/12/2015   Rectal bleeding 07/25/2013   hx   RLQ abdominal pain    S/P Total vaginal hysterectomy on 01/06/13 01/06/2013   Sleep apnea    mild no cpap   SOB (shortness of breath)    Social anxiety disorder 05/04/2014   Sore throat 02/20/2014   Stomach ulcer    hx   Tachycardia 02/20/2014   Type 2 diabetes mellitus (HCC)    Vitamin D  deficiency    Weakness of right leg    FROM BACK PROBLEM PER PT - no current problem per patient on 10/06/23   Social History   Socioeconomic History   Marital status: Married    Spouse name: Not on file   Number of children: 2   Years of education:  Not on file   Highest education level: Some college, no degree  Occupational History   Occupation: disability  Tobacco Use   Smoking status: Former    Current packs/day: 0.00    Average packs/day: 0.2 packs/day for 11.0 years (2.2 ttl pk-yrs)    Types: Cigarettes    Start date: 11/18/2002    Quit date: 11/17/2013    Years since quitting: 10.3   Smokeless tobacco: Never   Tobacco comments:    2 years quit  Vaping Use   Vaping status: Never Used  Substance and Sexual Activity   Alcohol use: Not Currently    Comment: 1 bottle wine per day x3 years, quit 09/2023   Drug use: No   Sexual activity: Yes    Birth control/protection: Surgical    Comment: Hysterectomy  Other Topics Concern   Not on file  Social History Narrative   Not on file   Social Drivers of Health   Tobacco Use: Medium Risk (03/23/2024)   Patient History    Smoking Tobacco Use: Former    Smokeless Tobacco Use: Never    Passive Exposure: Not on Actuary Strain: Low Risk (07/22/2023)   Overall Financial Resource Strain (CARDIA)    Difficulty of Paying Living Expenses: Not hard at all  Food Insecurity: No Food Insecurity (02/23/2024)   Epic    Worried About Programme Researcher, Broadcasting/film/video in the Last Year: Never true    [employer] of Food in the Last Year: Never true  Recent Concern: Food Insecurity - Food Insecurity Present (12/23/2023)   Epic    Worried About Programme Researcher, Broadcasting/film/video in the Last Year: Never true    [employer] of Food in the Last Year: Sometimes true  Transportation Needs: No Transportation Needs (02/23/2024)   Epic    Lack of Transportation (Medical): No    Lack of Transportation (Non-Medical): No  Physical Activity: Insufficiently Active (07/22/2023)   Exercise Vital Sign    Days of Exercise per Week: 2 days    Minutes of Exercise per Session: 40 min  Stress: Stress Concern Present (07/22/2023)   [employer] of Occupational Health - Occupational Stress Questionnaire    Feeling of Stress : Very much   Social Connections: Socially Integrated (07/22/2023)   Social Connection and Isolation Panel    Frequency of Communication with Friends and Family: More than three times a week    Frequency of Social Gatherings with Friends and Family: Twice a week    Attends Religious Services: More than 4 times per year    Active Member of [employer]  or Organizations: Yes    Attends Banker Meetings: More than 4 times per year    Marital Status: Married  Catering Manager Violence: Not At Risk (12/23/2023)   Epic    Fear of Current or Ex-Partner: No    Emotionally Abused: No    Physically Abused: No    Sexually Abused: No  Depression (PHQ2-9): Low Risk (03/23/2024)   Depression (PHQ2-9)    PHQ-2 Score: 4  Recent Concern: Depression (PHQ2-9) - Medium Risk (02/23/2024)   Depression (PHQ2-9)    PHQ-2 Score: 5  Alcohol Screen: Low Risk (07/22/2023)   Alcohol Screen    Last Alcohol Screening Score (AUDIT): 7  Housing: Low Risk (12/23/2023)   Epic    Unable to Pay for Housing in the Last Year: No    Number of Times Moved in the Last Year: 0    Homeless in the Last Year: No  Utilities: Not At Risk (12/23/2023)   Epic    Threatened with loss of utilities: No  Health Literacy: Adequate Health Literacy (07/22/2023)   B1300 Health Literacy    Frequency of need for help with medical instructions: Never   Family History  Problem Relation Age of Onset   Cancer Mother    Hypertension Mother    Diabetes Mother    Allergies Mother    Heart disease Mother    Clotting disorder Mother    Stroke Mother    Kidney disease Mother    Thyroid  disease Mother    Cervical cancer Mother    Cancer Father    Stroke Father    Pancreatic cancer Father 68   Hyperlipidemia Father    Hypertension Father    Liver disease Father    Schizophrenia Sister    Bipolar disorder Sister    Clotting disorder Sister    Brain cancer Sister    Cervical cancer Sister    Bipolar disorder Sister    Bipolar disorder Brother     Kidney disease Brother    Drug abuse Brother    Drug abuse Brother    Liver cancer Maternal Grandmother    Heart disease Maternal Grandmother    Colon cancer Paternal Grandfather 110   Colon cancer Paternal Uncle 77   Liver cancer Other    Colon polyps Neg Hx    Esophageal cancer Neg Hx    Stomach cancer Neg Hx    Allergies[1]  Review of Systems  Constitutional: Negative.   HENT: Negative.    Eyes: Negative.   Respiratory:  Negative for shortness of breath.   Cardiovascular:  Negative for chest pain.  Gastrointestinal: Negative.   Genitourinary: Negative.   Musculoskeletal: Negative.   Skin: Negative.   Neurological: Negative.   Endo/Heme/Allergies: Negative.   Psychiatric/Behavioral: Negative.        Objective:     BP (!) 114/90 (BP Location: Left Arm, Patient Position: Sitting)   Pulse 90   Ht 5' 5 (1.651 m)   Wt 208 lb (94.3 kg)   LMP 11/27/2012   SpO2 99%   BMI 34.61 kg/m  BP Readings from Last 3 Encounters:  03/23/24 (!) 114/90  03/07/24 106/74  02/25/24 119/70   Wt Readings from Last 3 Encounters:  03/23/24 208 lb (94.3 kg)  03/07/24 206 lb (93.4 kg)  02/25/24 215 lb (97.5 kg)    Physical Exam Vitals and nursing note reviewed.     GENERAL: Alert, cooperative, well developed, no acute distress HEENT: Normocephalic, normal oropharynx, moist mucous membranes CHEST:  Clear to auscultation bilaterally, no wheezes, rhonchi, or crackles CARDIOVASCULAR: Normal heart rate and rhythm, S1 and S2 normal without murmurs EXTREMITIES: No cyanosis or edema NEUROLOGICAL: Cranial nerves grossly intact, moves all extremities without gross motor or sensory deficit   Assessment & Plan:   Problem List Items Addressed This Visit   None Visit Diagnoses       Chronic migraine w/o aura w/o status migrainosus, not intractable       Relevant Medications   topiramate  (TOPAMAX ) 50 MG tablet      1. Chronic migraine w/o aura w/o status migrainosus, not  intractable Increase Topamax  50 mg.  Patient has appointment with primary care provider on February 24, encouraged to follow-up.  Patient encouraged to return the mobile unit as needed.  Patient education given on supportive care, red flags given for prompt reevaluation - topiramate  (TOPAMAX ) 50 MG tablet; Take 1 tablet (50 mg total) by mouth at bedtime.  Dispense: 30 tablet; Refill: 1  2. Obesity (BMI 30-39.9) (Primary) Check of [place]  controlled substance registry appropriate.  This provider reached out to the weight and wellness clinic provider to make sure they were in agreeance of patient continuing phentermine .  They stated if patient was already taking phentermine  it is okay for her to continue at this time.  Refills will be deferred to primary care provider or weight and wellness clinic.  Patient understands and agrees. - phentermine  15 MG capsule; Take 1 capsule (15 mg total) by mouth every morning.  Dispense: 30 capsule; Refill: 0    I have reviewed the patient's medical history (PMH, PSH, Social History, Family History, Medications, and allergies) , and have been updated if relevant. I spent 30 minutes reviewing chart and  face to face time with patient.   Return if symptoms worsen or fail to improve.    Trendon Zaring S Mayers, PA-C     [1]  Allergies Allergen Reactions   Asa [Aspirin] Anaphylaxis and Hives    Hives, chest tightness    Mushroom Extract Complex (Obsolete) Anaphylaxis, Swelling and Other (See Comments)    Reaction:  Eye swelling   Penicillins Anaphylaxis    Anaphylaxis ~49 yo necessitating ED visit (took pink thick liquid) - cannot recall taking any cephalosporins   Shellfish Allergy Anaphylaxis   Triamcinolone  Other (See Comments)    Skin issues    Zoloft  [Sertraline ]     Jittery feeling   "

## 2024-03-23 NOTE — Patient Instructions (Signed)
 VISIT SUMMARY:  Today, we discussed your ongoing management of diabetes, migraines, and weight control. We reviewed your current medications and made some adjustments to better manage your conditions.  YOUR PLAN:  -CHRONIC MIGRAINE: Chronic migraines are severe headaches that occur frequently and can be triggered by factors like light and overstimulation. We have increased your Topamax  dose to 50 mg at bedtime to help manage your migraines better. Please follow up with your primary care provider on February 24th to assess the effectiveness of this adjustment.   -OBESITY: Obesity is a condition characterized by excessive body fat that increases the risk of health problems. You have been managing this with Ozempic  and phentermine , and have achieved significant weight loss. We have coordinated with the weight management center regarding your phentermine  use and will send the prescription to the pharmacy if approved.  Health Maintenance, Female Adopting a healthy lifestyle and getting preventive care are important in promoting health and wellness. Ask your health care provider about: The right schedule for you to have regular tests and exams. Things you can do on your own to prevent diseases and keep yourself healthy. What should I know about diet, weight, and exercise? Eat a healthy diet  Eat a diet that includes plenty of vegetables, fruits, low-fat dairy products, and lean protein. Do not eat a lot of foods that are high in solid fats, added sugars, or sodium. Maintain a healthy weight Body mass index (BMI) is used to identify weight problems. It estimates body fat based on height and weight. Your health care provider can help determine your BMI and help you achieve or maintain a healthy weight. Get regular exercise Get regular exercise. This is one of the most important things you can do for your health. Most adults should: Exercise for at least 150 minutes each week. The exercise should  increase your heart rate and make you sweat (moderate-intensity exercise). Do strengthening exercises at least twice a week. This is in addition to the moderate-intensity exercise. Spend less time sitting. Even light physical activity can be beneficial. Watch cholesterol and blood lipids Have your blood tested for lipids and cholesterol at 50 years of age, then have this test every 5 years. Have your cholesterol levels checked more often if: Your lipid or cholesterol levels are high. You are older than 50 years of age. You are at high risk for heart disease. What should I know about cancer screening? Depending on your health history and family history, you may need to have cancer screening at various ages. This may include screening for: Breast cancer. Cervical cancer. Colorectal cancer. Skin cancer. Lung cancer. What should I know about heart disease, diabetes, and high blood pressure? Blood pressure and heart disease High blood pressure causes heart disease and increases the risk of stroke. This is more likely to develop in people who have high blood pressure readings or are overweight. Have your blood pressure checked: Every 3-5 years if you are 57-71 years of age. Every year if you are 72 years old or older. Diabetes Have regular diabetes screenings. This checks your fasting blood sugar level. Have the screening done: Once every three years after age 44 if you are at a normal weight and have a low risk for diabetes. More often and at a younger age if you are overweight or have a high risk for diabetes. What should I know about preventing infection? Hepatitis B If you have a higher risk for hepatitis B, you should be screened for this virus.  Talk with your health care provider to find out if you are at risk for hepatitis B infection. Hepatitis C Testing is recommended for: Everyone born from [birth year] through 1966. Anyone with known risk factors for hepatitis C. Sexually transmitted  infections (STIs) Get screened for STIs, including gonorrhea and chlamydia, if: You are sexually active and are younger than 50 years of age. You are older than 50 years of age and your health care provider tells you that you are at risk for this type of infection. Your sexual activity has changed since you were last screened, and you are at increased risk for chlamydia or gonorrhea. Ask your health care provider if you are at risk. Ask your health care provider about whether you are at high risk for HIV. Your health care provider may recommend a prescription medicine to help prevent HIV infection. If you choose to take medicine to prevent HIV, you should first get tested for HIV. You should then be tested every 3 months for as long as you are taking the medicine. Pregnancy If you are about to stop having your period (premenopausal) and you may become pregnant, seek counseling before you get pregnant. Take 400 to 800 micrograms (mcg) of folic acid  every day if you become pregnant. Ask for birth control (contraception) if you want to prevent pregnancy. Osteoporosis and menopause Osteoporosis is a disease in which the bones lose minerals and strength with aging. This can result in bone fractures. If you are 70 years old or older, or if you are at risk for osteoporosis and fractures, ask your health care provider if you should: Be screened for bone loss. Take a calcium  or vitamin D  supplement to lower your risk of fractures. Be given hormone replacement therapy (HRT) to treat symptoms of menopause. Follow these instructions at home: Alcohol use Do not drink alcohol if: Your health care provider tells you not to drink. You are pregnant, may be pregnant, or are planning to become pregnant. If you drink alcohol: Limit how much you have to: 0-1 drink a day. Know how much alcohol is in your drink. In the [place] one drink equals one 12 oz bottle of beer (355 mL), one 5 oz glass of wine (148 mL), or one  1 oz glass of hard liquor (44 mL). Lifestyle Do not use any products that contain nicotine or tobacco. These products include cigarettes, chewing tobacco, and vaping devices, such as e-cigarettes. If you need help quitting, ask your health care provider. Do not use street drugs. Do not share needles. Ask your health care provider for help if you need support or information about quitting drugs. General instructions Schedule regular health, dental, and eye exams. Stay current with your vaccines. Tell your health care provider if: You often feel depressed. You have ever been abused or do not feel safe at home. This information is not intended to replace advice given to you by your health care provider. Make sure you discuss any questions you have with your health care provider. Document Revised: 08/12/2023 Document Reviewed: 06/25/2020 Elsevier Patient Education  2025 [employer].

## 2024-03-24 ENCOUNTER — Ambulatory Visit (INDEPENDENT_AMBULATORY_CARE_PROVIDER_SITE_OTHER): Admitting: Family Medicine

## 2024-03-24 VITALS — BP 115/82 | HR 98 | Temp 98.3°F | Ht 65.0 in | Wt 204.0 lb

## 2024-03-24 DIAGNOSIS — E1165 Type 2 diabetes mellitus with hyperglycemia: Secondary | ICD-10-CM

## 2024-03-24 DIAGNOSIS — K760 Fatty (change of) liver, not elsewhere classified: Secondary | ICD-10-CM

## 2024-03-24 DIAGNOSIS — Z6833 Body mass index (BMI) 33.0-33.9, adult: Secondary | ICD-10-CM

## 2024-03-24 MED ORDER — OZEMPIC (0.25 OR 0.5 MG/DOSE) 2 MG/3ML ~~LOC~~ SOPN: 0.5000 mg | PEN_INJECTOR | SUBCUTANEOUS | 0 refills | Status: AC

## 2024-03-24 NOTE — Progress Notes (Unsigned)
 "  SUBJECTIVE:  Chief Complaint: Obesity  Interim History: Patient feeling that Ozempic  is really feeling better control in her intake and cravings when she was off Ozempic  for a while.  Her biggest cravings are rice and ice cream and when on ozempic  she is able to control quantity of rice.  She is enjoying looking at nutrition labels and figuring out nutrition and being more aware of food choices.  She has been able to get in all her protein and does feel it is very filling.  Over the next few weeks she has a trip to [place] for Valentine's Day to go to the [place].  She is feeling energetic and overall better.  Marisa [name] is here to discuss her progress with her obesity treatment plan. She is on the Category 2 Plan and states she is following her eating plan approximately 90 % of the time. She states she is exercising 60 minutes 5 times per week.   OBJECTIVE: Visit Diagnoses: Problem List Items Addressed This Visit       Other   Morbid obesity (HCC)   Relevant Medications   Semaglutide ,0.25 or 0.5MG /DOS, (OZEMPIC , 0.25 OR 0.5 MG/DOSE,) 2 MG/3ML SOPN   Other Visit Diagnoses       Type 2 diabetes mellitus with hyperglycemia, without long-term current use of insulin  (HCC)    -  Primary   Relevant Medications   Semaglutide ,0.25 or 0.5MG /DOS, (OZEMPIC , 0.25 OR 0.5 MG/DOSE,) 2 MG/3ML SOPN     NAFLD (nonalcoholic fatty liver disease)         BMI 33.0-33.9,adult           Vitals Temp: 98.3 F (36.8 C) BP: 115/82 Pulse Rate: 98 SpO2: 97 %   Anthropometric Measurements Height: 5' 5 (1.651 m) Weight: 204 lb (92.5 kg) BMI (Calculated): 33.95 Weight at Last Visit: 206 lb Weight Lost Since Last Visit: 2 Weight Gained Since Last Visit: 0 Starting Weight: 209 lb Total Weight Loss (lbs): 5 lb (2.268 kg)   Body Composition  Body Fat %: 43.4 % Fat Mass (lbs): 88.8 lbs Muscle Mass (lbs): 110.2 lbs Total Body Water  (lbs): 74.8 lbs Visceral Fat Rating : 11   Other Clinical  Data Today's Visit #: 3 Starting Date: 02/22/24 Comments: Cat 2     ASSESSMENT AND PLAN: Assessment & Plan Type 2 diabetes mellitus with hyperglycemia, without long-term current use of insulin  (HCC)  NAFLD (nonalcoholic fatty liver disease)  BMI 33.0-33.9,adult  Morbid obesity (HCC)    Diet: Marisa [name] is currently in the action stage of change. As such, her goal is to continue with weight loss efforts and has agreed to the Category 2 Plan.   Exercise:  For additional and more extensive health benefits, adults should increase their aerobic physical activity to 300 minutes (5 hours) a week of moderate-intensity, or 150 minutes a week of vigorous-intensity aerobic physical activity, or an equivalent combination of moderate- and vigorous-intensity activity. Additional health benefits are gained by engaging in physical activity beyond this amount.  and Adults should also include muscle-strengthening activities that involve all major muscle groups on 2 or more days a week.  Behavior Modification:  We discussed the following Behavioral Modification Strategies today: increasing lean protein intake, decreasing simple carbohydrates, increasing vegetables, meal planning and cooking strategies, keeping healthy foods in the home, and planning for success.   Return in about 3 weeks (around 04/14/2024).   She was informed of the importance of frequent follow up visits to maximize her success with intensive lifestyle  modifications for her multiple health conditions.  Attestation Statements:   Reviewed by clinician on day of visit: allergies, medications, problem list, medical history, surgical history, family history, social history, and previous encounter notes.  Adelita Cho, MD "

## 2024-03-25 NOTE — Telephone Encounter (Signed)
 Noted.

## 2024-04-05 ENCOUNTER — Ambulatory Visit: Payer: Self-pay | Admitting: Obstetrics and Gynecology

## 2024-04-12 ENCOUNTER — Ambulatory Visit: Admitting: Family

## 2024-04-20 ENCOUNTER — Ambulatory Visit: Payer: Self-pay | Admitting: Family

## 2024-04-21 ENCOUNTER — Ambulatory Visit (INDEPENDENT_AMBULATORY_CARE_PROVIDER_SITE_OTHER): Admitting: Family Medicine

## 2024-07-12 ENCOUNTER — Ambulatory Visit

## 2024-08-25 ENCOUNTER — Encounter: Admitting: Family
